# Patient Record
Sex: Female | Born: 1935
Health system: Southern US, Community
[De-identification: ages and names within clinical notes are randomized; demographics above are authoritative.]

## PROBLEM LIST (undated history)

## (undated) DIAGNOSIS — N189 Chronic kidney disease, unspecified: Secondary | ICD-10-CM

## (undated) DIAGNOSIS — C801 Malignant (primary) neoplasm, unspecified: Secondary | ICD-10-CM

## (undated) DIAGNOSIS — M329 Systemic lupus erythematosus, unspecified: Secondary | ICD-10-CM

## (undated) DIAGNOSIS — I219 Acute myocardial infarction, unspecified: Secondary | ICD-10-CM

## (undated) DIAGNOSIS — I5181 Takotsubo syndrome: Secondary | ICD-10-CM

## (undated) DIAGNOSIS — C50919 Malignant neoplasm of unspecified site of unspecified female breast: Secondary | ICD-10-CM

## (undated) DIAGNOSIS — E039 Hypothyroidism, unspecified: Secondary | ICD-10-CM

## (undated) DIAGNOSIS — IMO0002 Reserved for concepts with insufficient information to code with codable children: Secondary | ICD-10-CM

## (undated) DIAGNOSIS — Z1379 Encounter for other screening for genetic and chromosomal anomalies: Secondary | ICD-10-CM

## (undated) DIAGNOSIS — E079 Disorder of thyroid, unspecified: Secondary | ICD-10-CM

## (undated) DIAGNOSIS — M3500 Sicca syndrome, unspecified: Secondary | ICD-10-CM

## (undated) DIAGNOSIS — M1990 Unspecified osteoarthritis, unspecified site: Secondary | ICD-10-CM

## (undated) DIAGNOSIS — Z780 Asymptomatic menopausal state: Secondary | ICD-10-CM

## (undated) DIAGNOSIS — L719 Rosacea, unspecified: Secondary | ICD-10-CM

## (undated) DIAGNOSIS — I252 Old myocardial infarction: Secondary | ICD-10-CM

## (undated) DIAGNOSIS — I10 Essential (primary) hypertension: Secondary | ICD-10-CM

## (undated) HISTORY — DX: Disorder of thyroid, unspecified: E07.9

## (undated) HISTORY — PX: COLONOSCOPY: SHX174

## (undated) HISTORY — PX: TONSILLECTOMY: SUR1361

## (undated) HISTORY — DX: Asymptomatic menopausal state: Z78.0

## (undated) HISTORY — DX: Acute myocardial infarction, unspecified: I21.9

## (undated) HISTORY — DX: Reserved for concepts with insufficient information to code with codable children: IMO0002

## (undated) HISTORY — DX: Essential (primary) hypertension: I10

## (undated) HISTORY — DX: Malignant (primary) neoplasm, unspecified: C80.1

## (undated) HISTORY — DX: Systemic lupus erythematosus, unspecified: M32.9

## (undated) HISTORY — PX: LUMBAR DISC SURGERY: SHX700

## (undated) HISTORY — DX: Malignant neoplasm of unspecified site of unspecified female breast: C50.919

## (undated) HISTORY — DX: Takotsubo syndrome: I51.81

## (undated) HISTORY — DX: Sjogren syndrome, unspecified: M35.00

## (undated) HISTORY — PX: DILATION AND CURETTAGE OF UTERUS: SHX78

## (undated) HISTORY — DX: Rosacea, unspecified: L71.9

## (undated) HISTORY — PX: SPINE SURGERY: SHX786

## (undated) HISTORY — PX: CHOLECYSTECTOMY: SHX55

## (undated) HISTORY — DX: Encounter for other screening for genetic and chromosomal anomalies: Z13.79

## (undated) HISTORY — PX: THYROIDECTOMY: SHX17

## (undated) HISTORY — PX: BACK SURGERY: SHX140

## (undated) HISTORY — PX: TUBAL LIGATION: SHX77

---
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
## 2002-09-07 HISTORY — PX: SHOULDER ARTHROSCOPY: SHX128

## 2004-12-10 ENCOUNTER — Observation Stay: Payer: Self-pay | Admitting: Anesthesiology

## 2005-07-22 ENCOUNTER — Ambulatory Visit: Payer: Self-pay | Admitting: Internal Medicine

## 2005-07-23 ENCOUNTER — Ambulatory Visit: Payer: Self-pay | Admitting: Internal Medicine

## 2005-09-15 ENCOUNTER — Ambulatory Visit: Payer: Self-pay | Admitting: Surgery

## 2006-07-26 ENCOUNTER — Encounter: Payer: Self-pay | Admitting: Internal Medicine

## 2006-10-08 HISTORY — PX: KYPHOSIS SURGERY: SHX114

## 2006-10-15 ENCOUNTER — Ambulatory Visit: Payer: Self-pay

## 2006-10-26 ENCOUNTER — Other Ambulatory Visit: Payer: Self-pay

## 2006-10-28 ENCOUNTER — Ambulatory Visit: Payer: Self-pay | Admitting: Unknown Physician Specialty

## 2006-11-30 ENCOUNTER — Encounter: Payer: Self-pay | Admitting: Unknown Physician Specialty

## 2006-12-07 ENCOUNTER — Encounter: Payer: Self-pay | Admitting: Unknown Physician Specialty

## 2006-12-21 LAB — HM COLONOSCOPY: HM Colonoscopy: NORMAL

## 2007-06-09 ENCOUNTER — Ambulatory Visit: Payer: Self-pay | Admitting: Internal Medicine

## 2007-08-12 ENCOUNTER — Ambulatory Visit: Payer: Self-pay | Admitting: Orthopedic Surgery

## 2007-08-17 ENCOUNTER — Ambulatory Visit: Payer: Self-pay | Admitting: Orthopedic Surgery

## 2008-06-21 ENCOUNTER — Ambulatory Visit: Payer: Self-pay | Admitting: Internal Medicine

## 2009-07-01 ENCOUNTER — Ambulatory Visit: Payer: Self-pay | Admitting: Internal Medicine

## 2010-03-06 LAB — HM PAP SMEAR: HM Pap smear: NEGATIVE

## 2010-08-05 ENCOUNTER — Ambulatory Visit: Payer: Self-pay | Admitting: Internal Medicine

## 2010-12-22 ENCOUNTER — Ambulatory Visit: Payer: Self-pay | Admitting: Internal Medicine

## 2011-05-25 ENCOUNTER — Encounter: Payer: Self-pay | Admitting: Internal Medicine

## 2011-05-25 ENCOUNTER — Ambulatory Visit (INDEPENDENT_AMBULATORY_CARE_PROVIDER_SITE_OTHER): Payer: Medicare Other | Admitting: Internal Medicine

## 2011-05-25 VITALS — BP 134/70 | HR 87 | Temp 97.8°F | Resp 16 | Ht 64.0 in | Wt 149.2 lb

## 2011-05-25 DIAGNOSIS — E039 Hypothyroidism, unspecified: Secondary | ICD-10-CM

## 2011-05-25 DIAGNOSIS — R21 Rash and other nonspecific skin eruption: Secondary | ICD-10-CM

## 2011-05-25 DIAGNOSIS — D692 Other nonthrombocytopenic purpura: Secondary | ICD-10-CM | POA: Insufficient documentation

## 2011-05-25 MED ORDER — LEVOTHYROXINE SODIUM 88 MCG PO TABS: 88.0000 ug | ORAL_TABLET | Freq: Every day | ORAL | Status: DC

## 2011-05-25 MED ORDER — CEPHALEXIN 500 MG PO CAPS: 500.0000 mg | ORAL_CAPSULE | Freq: Three times a day (TID) | ORAL | Status: AC

## 2011-05-25 NOTE — Patient Instructions (Signed)
We will need to check your electrolytes and kidney function in late October of this year. No office visit required,  But make appt for lab.  Make appt for Medicare annual welllness exam in the spring.  Call when you need your mammogram scheduled. We are prescribing an antibiotic to try and clea u p the skin rash./

## 2011-05-25 NOTE — Progress Notes (Signed)
  Subjective:    Patient ID: Kim Santiago, female    DOB: [birth year], 76 y.o.   MRN: [id number]  HPI  76 yr old white female with history of chronic back pain secondary to failed L1 kyphoplasty in 2008,  Prior L4-5 diskectomy, presents for followup on chronic issues.  Has chief complaint of persistent skin rash on chest following a tgrip to the beach 2 months ago with family. Mildly pruritic, nonspreading.  Historyof sune xposrue and public swimming pool exposure.   Past Medical History  Diagnosis Date  . Hypertension   . Cancer     Thyroid  . Menopause 40s    natural, hot flashes and mood lability now gone, off prempro 7 months  . Osteoporosis   . hypothyroidism     secondary to thyroidectomy for thyroid ca   No current outpatient prescriptions on file prior to visit.      Review of Systems  Constitutional: Negative for fever, chills and unexpected weight change.  HENT: Negative for hearing loss, ear pain, nosebleeds, congestion, sore throat, facial swelling, rhinorrhea, sneezing, mouth sores, trouble swallowing, neck pain, neck stiffness, voice change, postnasal drip, sinus pressure, tinnitus and ear discharge.   Eyes: Negative for pain, discharge, redness and visual disturbance.  Respiratory: Negative for cough, chest tightness, shortness of breath, wheezing and stridor.   Cardiovascular: Negative for chest pain, palpitations and leg swelling.  Musculoskeletal: Negative for myalgias and arthralgias.  Skin: Negative for color change and rash.  Neurological: Negative for dizziness, weakness, light-headedness and headaches.  Hematological: Negative for adenopathy.       Objective:   Physical Exam  Constitutional: She is oriented to person, place, and time. She appears well-developed and well-nourished.  HENT:  Mouth/Throat: Oropharynx is clear and moist.  Eyes: EOM are normal. Pupils are equal, round, and reactive to light. No scleral icterus.  Neck: Normal range of motion.  Neck supple. No JVD present. No thyromegaly present.  Cardiovascular: Normal rate, regular rhythm, normal heart sounds and intact distal pulses.   Pulmonary/Chest: Effort normal and breath sounds normal.  Abdominal: Soft. Bowel sounds are normal. She exhibits no mass. There is no tenderness.  Musculoskeletal: Normal range of motion. She exhibits no edema.  Lymphadenopathy:    She has no cervical adenopathy.  Neurological: She is alert and oriented to person, place, and time.  Skin: Skin is warm and dry. Rash noted.     Psychiatric: She has a normal mood and affect.          Assessment & Plan:  Rash:  Has the appearance of impetigo , and given her exposure to public swimming pool will treat empiricially with keflex.   Osteoporosisl with prior L1 vertebral fracture.  Will request old records from [facility] and check with insurance about Prolia coverage. Discussed calcium, vitamin d supplementation and recommended increasing calcium to 1200 mg daily .

## 2011-06-18 ENCOUNTER — Encounter: Payer: Self-pay | Admitting: Internal Medicine

## 2011-06-29 ENCOUNTER — Other Ambulatory Visit: Payer: Medicare Other

## 2011-07-03 ENCOUNTER — Other Ambulatory Visit: Payer: Medicare Other

## 2011-07-06 ENCOUNTER — Other Ambulatory Visit (INDEPENDENT_AMBULATORY_CARE_PROVIDER_SITE_OTHER): Payer: Medicare Other | Admitting: *Deleted

## 2011-07-06 DIAGNOSIS — Z79899 Other long term (current) drug therapy: Secondary | ICD-10-CM

## 2011-07-06 LAB — RENAL FUNCTION PANEL
Albumin: 4.1 g/dL (ref 3.5–5.2)
BUN: 15 mg/dL (ref 6–23)
CO2: 31 mEq/L (ref 19–32)
Chloride: 98 mEq/L (ref 96–112)
GFR: 84.04 mL/min (ref >60.00)
Potassium: 4 mEq/L (ref 3.5–5.1)

## 2011-09-08 DIAGNOSIS — I219 Acute myocardial infarction, unspecified: Secondary | ICD-10-CM

## 2011-09-08 HISTORY — DX: Acute myocardial infarction, unspecified: I21.9

## 2011-09-30 ENCOUNTER — Ambulatory Visit: Payer: Self-pay | Admitting: Internal Medicine

## 2011-10-02 ENCOUNTER — Encounter: Payer: Self-pay | Admitting: Internal Medicine

## 2011-10-02 ENCOUNTER — Ambulatory Visit (INDEPENDENT_AMBULATORY_CARE_PROVIDER_SITE_OTHER): Payer: Medicare Other | Admitting: Internal Medicine

## 2011-10-02 VITALS — BP 128/70 | HR 80 | Temp 97.7°F | Wt 150.0 lb

## 2011-10-02 DIAGNOSIS — J310 Chronic rhinitis: Secondary | ICD-10-CM

## 2011-10-02 DIAGNOSIS — E039 Hypothyroidism, unspecified: Secondary | ICD-10-CM

## 2011-10-02 DIAGNOSIS — E032 Hypothyroidism due to medicaments and other exogenous substances: Secondary | ICD-10-CM | POA: Insufficient documentation

## 2011-10-02 DIAGNOSIS — H6692 Otitis media, unspecified, left ear: Secondary | ICD-10-CM

## 2011-10-02 DIAGNOSIS — H6690 Otitis media, unspecified, unspecified ear: Secondary | ICD-10-CM

## 2011-10-02 DIAGNOSIS — I10 Essential (primary) hypertension: Secondary | ICD-10-CM | POA: Insufficient documentation

## 2011-10-02 LAB — HM MAMMOGRAPHY: HM Mammogram: NORMAL

## 2011-10-02 MED ORDER — OMEPRAZOLE 20 MG PO CPDR: 20.0000 mg | DELAYED_RELEASE_CAPSULE | Freq: Every day | ORAL | Status: DC

## 2011-10-02 MED ORDER — AMOXICILLIN-POT CLAVULANATE 875-125 MG PO TABS: 1.0000 | ORAL_TABLET | Freq: Two times a day (BID) | ORAL | Status: AC

## 2011-10-02 MED ORDER — HYDROCHLOROTHIAZIDE 25 MG PO TABS: 25.0000 mg | ORAL_TABLET | Freq: Every day | ORAL | Status: DC

## 2011-10-02 MED ORDER — LEVOTHYROXINE SODIUM 88 MCG PO TABS: 88.0000 ug | ORAL_TABLET | Freq: Every day | ORAL | Status: DC

## 2011-10-02 MED ORDER — MOMETASONE FUROATE 50 MCG/ACT NA SUSP: 2.0000 | Freq: Every day | NASAL | Status: DC

## 2011-10-02 NOTE — Assessment & Plan Note (Signed)
Secondary to prolonged sinus congestion with inflammation of the left-sided maxillary sinus as well. Will treat with Augmentin 875/125 one tablet twice daily for 10 days I have asked her to start using Sudafed PE 10 mg every 6 hours as needed for congestion for the next few days. She will start Nasonex one squirt on each side daily starting on Sunday to manage inflammation and allergic rhinitis. I've also recommended she start using simply saline as a saline lavage twice daily to manage any debris and to flush sinuses.

## 2011-10-02 NOTE — Progress Notes (Signed)
  Subjective:    Patient ID: Kim Santiago, female    DOB: 11-30-1934, 77 y.o.   MRN: [id number]  HPI  77 yr old presents with recurrent sinusitis,  Now with left cheek pain and eye pain for the past week.  Using saline and afrin x 7 days  Which has been helping.     Review of Systems  Constitutional: Negative for fever, chills and unexpected weight change.  HENT: Positive for ear pain, congestion, rhinorrhea, sneezing, postnasal drip and sinus pressure. Negative for hearing loss, nosebleeds, sore throat, facial swelling, mouth sores, trouble swallowing, neck pain, neck stiffness, voice change, tinnitus and ear discharge.   Eyes: Negative for pain, discharge, redness and visual disturbance.  Respiratory: Negative for cough, chest tightness, shortness of breath, wheezing and stridor.   Cardiovascular: Negative for chest pain, palpitations and leg swelling.  Musculoskeletal: Negative for myalgias and arthralgias.  Skin: Negative for color change and rash.  Neurological: Negative for dizziness, weakness, light-headedness and headaches.  Hematological: Negative for adenopathy.       Objective:   Physical Exam  Constitutional: She is oriented to person, place, and time. She appears well-developed and well-nourished.  HENT:  Right Ear: Tympanic membrane and ear canal normal.  Left Ear: Hearing and ear canal normal. Tympanic membrane is injected and scarred. A middle ear effusion is present.  Nose:    Mouth/Throat: Oropharynx is clear and moist.  Eyes: EOM are normal. Pupils are equal, round, and reactive to light. No scleral icterus.  Neck: Normal range of motion. Neck supple. No JVD present. No thyromegaly present.  Cardiovascular: Normal rate, regular rhythm, normal heart sounds and intact distal pulses.   Pulmonary/Chest: Effort normal and breath sounds normal.  Abdominal: Soft. Bowel sounds are normal. She exhibits no mass. There is no tenderness.  Musculoskeletal: Normal range of  motion. She exhibits no edema.  Lymphadenopathy:    She has no cervical adenopathy.  Neurological: She is alert and oriented to person, place, and time.  Skin: Skin is warm and dry.  Psychiatric: She has a normal mood and affect.          Assessment & Plan:  Sinusitis :  Recurrent

## 2011-10-02 NOTE — Assessment & Plan Note (Signed)
Managed with low-dose generic Synthroid with therapeutic TSH by last check in October.

## 2011-10-02 NOTE — Patient Instructions (Signed)
Augmentin for 10 days, for ear and sinus infection  Add Sufafed PE 10 mg every 6 hours for ear pian /congestion for a few days    Start the steroid nasal spray (nasonex ) on Sunday   (this deals with inflammation and allergic component)   Use simply Saline (OTC)  Twice daily before your steroid dose to flush sinuses

## 2011-10-02 NOTE — Assessment & Plan Note (Signed)
Well-controlled on hydrochlorothiazide 25 mg 1 tablet daily .  renal function is normal per last checked in October.

## 2011-10-06 ENCOUNTER — Telehealth: Payer: Self-pay | Admitting: *Deleted

## 2011-10-06 NOTE — Telephone Encounter (Signed)
Advised pt.  She took for 3 and a half days.  She will stop and will call if any problems.

## 2011-10-06 NOTE — Telephone Encounter (Signed)
Pt has been taking amox for a sinus infection since last Friday.  She noticed she has a fine non itchy rash on her legs since last night.  She hasnt had any amox today, last took a dose last night.  She thinks she is having a reaction to the amox.  She is also taking nasonex.  Please advise.

## 2011-10-06 NOTE — Telephone Encounter (Signed)
Left message on machine for patient to call.

## 2011-10-06 NOTE — Telephone Encounter (Signed)
Advised patient

## 2011-10-06 NOTE — Telephone Encounter (Signed)
If any ear pain, persistent sinus pressure, fever, then we should start another antibiotic.  I would recommend Azithromycin 250mg  tabs, 2 tabs on day 1 then 1 tab daily x 4 days.

## 2011-10-06 NOTE — Telephone Encounter (Signed)
Lets have her stop Amoxicillin.  How many days did she complete? If ANY wheezing, shortness of breath or other symptoms, she should call us.

## 2011-10-12 ENCOUNTER — Telehealth: Payer: Self-pay | Admitting: Internal Medicine

## 2011-10-12 NOTE — Telephone Encounter (Signed)
See note below.  Pt says she is much better but still feels something there. She doesn't have any fever or colored drainage.  She has a little drainage, but not more than usual. She is using nasonex and has found a small painless white spot on her jaw.

## 2011-10-12 NOTE — Telephone Encounter (Signed)
Pt called when she came in  In Jan 25 you gave her rx for sinus infection.  She broke out is a rash and dr walker told her to stop taking meds.  She took them for 4 days.  Pt stated she still feels pressure she is a little better she wanted to know if she should take another antiobodic  Or should she wait to see how she doing since she took the meds for 4 days. Pt still is on nasanex the pressure is not bad  [facility] [place]

## 2011-10-12 NOTE — Telephone Encounter (Signed)
She does not need an antibiotic if her drainage is now clear. Have her add afrin twice daily to the nasonex for a few days if she still feels congested.  I do not know what to say about the white spot without seeing it

## 2011-10-13 NOTE — Telephone Encounter (Signed)
Advised pt, she will keep an eye on the white spot and will call back if needed.

## 2011-10-16 ENCOUNTER — Telehealth: Payer: Self-pay | Admitting: *Deleted

## 2011-10-16 NOTE — Telephone Encounter (Signed)
She has been taking hctz for over a year without a problem,  They should know that

## 2011-10-16 NOTE — Telephone Encounter (Signed)
[facility] has faxed a form asking for clarification on HCTZ script.  Fax is in red folder.  They are asking if ok to fill since pt has a sulfa allergy.

## 2011-10-19 NOTE — Telephone Encounter (Signed)
Form faxed back to [facility].

## 2011-11-06 ENCOUNTER — Ambulatory Visit (INDEPENDENT_AMBULATORY_CARE_PROVIDER_SITE_OTHER): Payer: Medicare Other | Admitting: Internal Medicine

## 2011-11-06 VITALS — BP 120/86 | HR 74 | Temp 98.6°F

## 2011-11-06 DIAGNOSIS — N390 Urinary tract infection, site not specified: Secondary | ICD-10-CM

## 2011-11-06 LAB — POCT URINALYSIS DIPSTICK
Bilirubin, UA: NEGATIVE
Glucose, UA: NEGATIVE
Nitrite, UA: NEGATIVE
Spec Grav, UA: 1.015

## 2011-11-06 MED ORDER — CIPROFLOXACIN HCL 500 MG PO TABS: 500.0000 mg | ORAL_TABLET | Freq: Two times a day (BID) | ORAL | Status: AC

## 2011-11-06 NOTE — Progress Notes (Signed)
  Subjective:    Patient ID: Kim Santiago, female    DOB: 09/06/1935, 77 y.o.   MRN: [id number]  HPI   77 yr old white female presents with left sided back pain and new onset frequency and urgency for the last 4 days.  Some mild nausea but no vomiting     Review of Systems  Constitutional: Negative for fever, chills and unexpected weight change.  HENT: Negative for hearing loss, ear pain, nosebleeds, congestion, sore throat, facial swelling, rhinorrhea, sneezing, mouth sores, trouble swallowing, neck pain, neck stiffness, voice change, postnasal drip, sinus pressure, tinnitus and ear discharge.   Eyes: Negative for pain, discharge, redness and visual disturbance.  Respiratory: Negative for cough, chest tightness, shortness of breath, wheezing and stridor.   Cardiovascular: Negative for chest pain, palpitations and leg swelling.  Musculoskeletal: Negative for myalgias and arthralgias.  Skin: Negative for color change and rash.  Neurological: Negative for dizziness, weakness, light-headedness and headaches.  Hematological: Negative for adenopathy.       Objective:   Physical Exam        Assessment & Plan:   Urinary tract infection With positive UA.  Will treat empirically pending urine culture.    Updated Medication List Outpatient Encounter Prescriptions as of 11/06/2011  Medication Sig Dispense Refill  . Calcium Carbonate-Vitamin D (CALCIUM + D) 600-200 MG-UNIT TABS Take 1 tablet by mouth daily.        . cholecalciferol (VITAMIN D) 1000 UNITS tablet Take 1,000 Units by mouth daily.        . ciprofloxacin (CIPRO) 500 MG tablet Take 1 tablet (500 mg total) by mouth 2 (two) times daily.  10 tablet  0  . hydrochlorothiazide (HYDRODIURIL) 25 MG tablet Take 1 tablet (25 mg total) by mouth daily.  90 tablet  3  . levothyroxine (SYNTHROID, LEVOTHROID) 88 MCG tablet Take 1 tablet (88 mcg total) by mouth daily.  90 tablet  3  . mometasone (NASONEX) 50 MCG/ACT nasal spray Place 2  sprays into the nose daily.  17 g  12  . Multiple Vitamins-Minerals (OSTEO COMPLEX PO) Take one by mouth daily      . omeprazole (PRILOSEC) 20 MG capsule Take 1 capsule (20 mg total) by mouth daily.  90 capsule  3

## 2011-11-08 ENCOUNTER — Encounter: Payer: Self-pay | Admitting: Internal Medicine

## 2011-11-08 DIAGNOSIS — N390 Urinary tract infection, site not specified: Secondary | ICD-10-CM | POA: Insufficient documentation

## 2011-11-08 NOTE — Assessment & Plan Note (Signed)
With positive UA.  Will treat empirically pending urine culture.  

## 2011-11-09 LAB — URINE CULTURE: Colony Count: >=100000

## 2011-12-21 ENCOUNTER — Encounter: Payer: Self-pay | Admitting: Internal Medicine

## 2011-12-21 ENCOUNTER — Ambulatory Visit (INDEPENDENT_AMBULATORY_CARE_PROVIDER_SITE_OTHER): Payer: Medicare Other | Admitting: Internal Medicine

## 2011-12-21 VITALS — BP 146/80 | HR 85 | Temp 98.1°F | Resp 16 | Ht 64.5 in | Wt 149.8 lb

## 2011-12-21 DIAGNOSIS — I10 Essential (primary) hypertension: Secondary | ICD-10-CM

## 2011-12-21 DIAGNOSIS — Z0000 Encounter for general adult medical examination without abnormal findings: Secondary | ICD-10-CM

## 2011-12-21 DIAGNOSIS — E039 Hypothyroidism, unspecified: Secondary | ICD-10-CM

## 2011-12-21 NOTE — Assessment & Plan Note (Signed)
Her last DEXA scan was in 2011 at which time her T. scores are in the osteopenia range. We will repeat this in November of 2013 and begin treatment if there has been progression

## 2011-12-21 NOTE — Assessment & Plan Note (Addendum)
Her blood pressure is elevated today but has been well controlled in the past. We have not many changes today. I have asked her to check her pressure at home periodically and call me if the numbers continue to show elevation of the 130/80. She will return for renal function panel at her next trip

## 2011-12-21 NOTE — Patient Instructions (Addendum)
The maximum recommended daily dose of tylenol is 2000 mg  Using alleve or ibuprofen infrequently (less than daily) should not cause an ulcer,  Unless your alcohol intake is > 1 drink /night  Return in May for fasting labs (please drink water before your labs) to check kidney/liver  function,  Thyroid and cholesterol    I recommend yoga classes for strengthening of your thoracic spine   Our April 2012 x rays owed loss of heiight  at T7 and T8, which may have occurred from a mild fracture in the past.   Continue exercise,  Calcium and vitamin d supplements.  We will repeat your DEXA  Scan Nov 2013.

## 2011-12-21 NOTE — Progress Notes (Signed)
Patient ID: Kim Santiago, female   DOB: [birth year], 77 y.o.   MRN: [id number]  The patient is here for annual Medicare wellness examination and management of other chronic and acute problems. She has deferred pelvic exam as this was done last year.   The risk factors are reflected in the social history.  The roster of all physicians providing medical care to patient - is listed in the Snapshot section of the chart.  Activities of daily living:  The patient is 100% independent in all ADLs: dressing, toileting, feeding as well as independent mobility  Home safety : The patient has smoke detectors in the home. They wear seatbelts.  There are no firearms at home. There is no violence in the home.   There is no risks for hepatitis, STDs or HIV. There is no   history of blood transfusion. They have no travel history to infectious disease endemic areas of the world.  The patient has seen their dentist in the last six month. They have seen their eye doctor in the last year. They admit to slight hearing difficulty with regard to whispered voices and some television programs.  They have deferred audiologic testing in the last year.  They do not  have excessive sun exposure. Discussed the need for sun protection: hats, long sleeves and use of sunscreen if there is significant sun exposure.   Diet: the importance of a healthy diet is discussed. They do have a healthy diet.  The benefits of regular aerobic exercise were discussed. She walks 4 times per week ,  20 minutes.   Depression screen: there are no signs or vegative symptoms of depression- irritability, change in appetite, anhedonia, sadness/tearfullness.  Cognitive assessment: the patient manages all their financial and personal affairs and is actively engaged. They could relate day,date,year and events; recalled 2/3 objects at 3 minutes; performed clock-face test normally.  The following portions of the patient's history were reviewed and  updated as appropriate: allergies, current medications, past family history, past medical history,  past surgical history, past social history  and problem list.  Visual acuity was not assessed per patient preference since she has regular follow up with her ophthalmologist. Hearing and body mass index were assessed and reviewed.   During the course of the visit the patient was educated and counseled about appropriate screening and preventive services including : fall prevention , diabetes screening, nutrition counseling, colorectal cancer screening, and recommended immunizations.   BP 146/80  Pulse 85  Temp(Src) 98.1 F (36.7 C) (Oral)  Resp 16  Ht 5' 4.5" (1.638 m)  Wt 149 lb 12 oz (67.926 kg)  BMI 25.31 kg/m2  SpO2 100%  BP 146/80  Pulse 85  Temp(Src) 98.1 F (36.7 C) (Oral)  Resp 16  Ht 5' 4.5" (1.638 m)  Wt 149 lb 12 oz (67.926 kg)  BMI 25.31 kg/m2  SpO2 100%  General Appearance:    Alert, cooperative, no distress, appears stated age  Head:    Normocephalic, without obvious abnormality, atraumatic  Eyes:    PERRL, conjunctiva/corneas clear, EOM's intact, fundi    benign, both eyes  Ears:    Normal TM's and external ear canals, both ears  Nose:   Nares normal, septum midline, mucosa normal, no drainage    or sinus tenderness  Throat:   Lips, mucosa, and tongue normal; teeth and gums normal  Neck:   Supple, symmetrical, trachea midline, no adenopathy;    thyroid:  no enlargement/tenderness/nodules; no carotid  bruit or JVD  Back:     Symmetric, no curvature, ROM normal, no CVA tenderness  Lungs:     Clear to auscultation bilaterally, respirations unlabored  Chest Wall:    No tenderness or deformity   Heart:    Regular rate and rhythm, S1 and S2 normal, no murmur, rub   or gallop     Abdomen:     Soft, non-tender, bowel sounds active all four quadrants,    no masses, no organomegaly        Extremities:   Extremities normal, atraumatic, no cyanosis or edema  Pulses:    2+ and symmetric all extremities  Skin:   Skin color, texture, turgor normal, no rashes or lesions  Lymph nodes:   Cervical, supraclavicular, and axillary nodes normal  Neurologic:   CNII-XII intact, normal strength, sensation and reflexes    throughout    Hypertension Her blood pressure is elevated today but has been well controlled in the past. We have not many changes today. I have asked her to check her pressure at home periodically and call me if the numbers continue to show elevation of the 130/80. She will return for renal function panel at her next trip  Screening for osteoporosis Her last DEXA scan was in 2011 at which time her T. scores are in the osteopenia range. We will repeat this in November of 2013 and begin treatment if there has been progression  Unspecified hypothyroidism She is due for a repeat TSH when she returns for her impending trip. We will also check liver lipid panel at the same time.    Updated Medication List Outpatient Encounter Prescriptions as of 12/21/2011  Medication Sig Dispense Refill  . Calcium Carbonate-Vitamin D (CALCIUM + D) 600-200 MG-UNIT TABS Take 1 tablet by mouth daily.        . cholecalciferol (VITAMIN D) 1000 UNITS tablet Take 1,000 Units by mouth daily.        . hydrochlorothiazide (HYDRODIURIL) 25 MG tablet Take 1 tablet (25 mg total) by mouth daily.  90 tablet  3  . levothyroxine (SYNTHROID, LEVOTHROID) 88 MCG tablet Take 1 tablet (88 mcg total) by mouth daily.  90 tablet  3  . mometasone (NASONEX) 50 MCG/ACT nasal spray Place 2 sprays into the nose daily.  17 g  12  . Multiple Vitamins-Minerals (OSTEO COMPLEX PO) Take one by mouth daily      . omeprazole (PRILOSEC) 20 MG capsule Take 1 capsule (20 mg total) by mouth daily.  90 capsule  3  . vitamin B-12 (CYANOCOBALAMIN) 1000 MCG tablet Take 1,000 mcg by mouth daily.

## 2011-12-21 NOTE — Assessment & Plan Note (Signed)
She is due for a repeat TSH when she returns for her impending trip. We will also check liver lipid panel at the same time.

## 2012-01-06 ENCOUNTER — Telehealth: Payer: Self-pay | Admitting: Internal Medicine

## 2012-01-06 MED ORDER — ZOSTER VACCINE LIVE 19400 UNT/0.65ML ~~LOC~~ SOLR: 0.6500 mL | Freq: Once | SUBCUTANEOUS | Status: DC

## 2012-01-06 NOTE — Telephone Encounter (Signed)
[facility]  [facility] so she doesn't have to wait

## 2012-01-06 NOTE — Telephone Encounter (Signed)
[phone] Pt would like to get a rx for shingle shot [employer] [facility]  [address] Pt can pick up when have labs on 5/14.

## 2012-01-07 MED ORDER — ZOSTER VACCINE LIVE 19400 UNT/0.65ML ~~LOC~~ SOLR: 0.6500 mL | Freq: Once | SUBCUTANEOUS | Status: AC

## 2012-01-07 NOTE — Telephone Encounter (Signed)
Patient notified, Rx sent to pharmacy

## 2012-01-11 ENCOUNTER — Telehealth: Payer: Self-pay | Admitting: Internal Medicine

## 2012-01-11 NOTE — Telephone Encounter (Signed)
[phone] Pt called checking on shingle rx .  Her drug store said they didn't have it [address]  [facility]

## 2012-01-11 NOTE — Telephone Encounter (Signed)
This has already been done. Patient notified.

## 2012-01-19 ENCOUNTER — Other Ambulatory Visit (INDEPENDENT_AMBULATORY_CARE_PROVIDER_SITE_OTHER): Payer: Medicare Other | Admitting: *Deleted

## 2012-01-19 DIAGNOSIS — Z79899 Other long term (current) drug therapy: Secondary | ICD-10-CM

## 2012-01-19 DIAGNOSIS — I10 Essential (primary) hypertension: Secondary | ICD-10-CM

## 2012-01-19 DIAGNOSIS — E0590 Thyrotoxicosis, unspecified without thyrotoxic crisis or storm: Secondary | ICD-10-CM

## 2012-01-19 LAB — COMPREHENSIVE METABOLIC PANEL
BUN: 16 mg/dL (ref 6–23)
CO2: 30 mEq/L (ref 19–32)
Calcium: 9.3 mg/dL (ref 8.4–10.5)
Chloride: 102 mEq/L (ref 96–112)
Creatinine, Ser: 0.7 mg/dL (ref 0.4–1.2)
GFR: 89.64 mL/min (ref >60.00)
Glucose, Bld: 82 mg/dL (ref 70–99)
Total Bilirubin: 0.5 mg/dL (ref 0.3–1.2)

## 2012-01-19 LAB — LIPID PANEL
Cholesterol: 168 mg/dL (ref 0–200)
HDL: 43.1 mg/dL (ref >39.00)
Triglycerides: 83 mg/dL (ref 0.0–149.0)

## 2012-01-19 LAB — HEPATIC FUNCTION PANEL
AST: 18 U/L (ref 0–37)
Albumin: 3.7 g/dL (ref 3.5–5.2)
Alkaline Phosphatase: 80 U/L (ref 39–117)
Bilirubin, Direct: 0 mg/dL (ref 0.0–0.3)
Total Bilirubin: 0.5 mg/dL (ref 0.3–1.2)

## 2012-02-02 ENCOUNTER — Encounter: Payer: Self-pay | Admitting: Internal Medicine

## 2012-05-08 HISTORY — PX: CARDIAC CATHETERIZATION: SHX172

## 2012-05-30 ENCOUNTER — Inpatient Hospital Stay: Payer: Self-pay | Admitting: Internal Medicine

## 2012-05-30 ENCOUNTER — Telehealth: Payer: Self-pay | Admitting: Internal Medicine

## 2012-05-30 DIAGNOSIS — R079 Chest pain, unspecified: Secondary | ICD-10-CM

## 2012-05-30 LAB — CBC
HCT: 43.4 % (ref 35.0–47.0)
MCH: 30.8 pg (ref 26.0–34.0)
MCHC: 34.9 g/dL (ref 32.0–36.0)
Platelet: 215 10*3/uL (ref 150–440)
RDW: 13.6 % (ref 11.5–14.5)
WBC: 5.8 10*3/uL (ref 3.6–11.0)

## 2012-05-30 LAB — BASIC METABOLIC PANEL
Anion Gap: 8 (ref 7–16)
BUN: 11 mg/dL (ref 7–18)
Calcium, Total: 9.4 mg/dL (ref 8.5–10.1)
Chloride: 100 mmol/L (ref 98–107)
Co2: 29 mmol/L (ref 21–32)
Creatinine: 0.53 mg/dL — ABNORMAL LOW (ref 0.60–1.30)
EGFR (African American): >60
Glucose: 133 mg/dL — ABNORMAL HIGH (ref 65–99)
Osmolality: 275 (ref 275–301)

## 2012-05-30 LAB — TROPONIN I: Troponin-I: 0.07 ng/mL — ABNORMAL HIGH

## 2012-05-30 LAB — CK TOTAL AND CKMB (NOT AT ARMC): CK, Total: 53 U/L (ref 21–215)

## 2012-05-30 NOTE — Telephone Encounter (Signed)
Patient calling, having severe chest pain.  A friend is driving her to the ED now and states that they are almost there.  She did take an adult ASA.  Has not had heart issues in the past and no NTG to use.

## 2012-05-31 LAB — CBC WITH DIFFERENTIAL/PLATELET
Basophil #: 0.1 10*3/uL (ref 0.0–0.1)
Eosinophil #: 0.3 10*3/uL (ref 0.0–0.7)
Eosinophil %: 3.9 %
Lymphocyte %: 31.6 %
MCH: 29.9 pg (ref 26.0–34.0)
MCHC: 34.4 g/dL (ref 32.0–36.0)
MCV: 87 fL (ref 80–100)
Monocyte #: 0.7 x10 3/mm (ref 0.2–0.9)
Neutrophil %: 55.8 %
Platelet: 229 10*3/uL (ref 150–440)
RBC: 4.44 10*6/uL (ref 3.80–5.20)
WBC: 8 10*3/uL (ref 3.6–11.0)

## 2012-05-31 LAB — BASIC METABOLIC PANEL
Calcium, Total: 9 mg/dL (ref 8.5–10.1)
Creatinine: 0.63 mg/dL (ref 0.60–1.30)
EGFR (African American): >60
EGFR (Non-African Amer.): >60
Glucose: 107 mg/dL — ABNORMAL HIGH (ref 65–99)
Osmolality: 277 (ref 275–301)
Sodium: 139 mmol/L (ref 136–145)

## 2012-05-31 LAB — TROPONIN I: Troponin-I: 0.83 ng/mL — ABNORMAL HIGH

## 2012-05-31 LAB — APTT: Activated PTT: 119.2 secs — ABNORMAL HIGH (ref 23.6–35.9)

## 2012-06-01 ENCOUNTER — Telehealth: Payer: Self-pay | Admitting: Internal Medicine

## 2012-06-01 LAB — LIPID PANEL
Cholesterol: 157 mg/dL (ref 0–200)
Ldl Cholesterol, Calc: 108 mg/dL — ABNORMAL HIGH (ref 0–100)
Triglycerides: 57 mg/dL (ref 0–200)
VLDL Cholesterol, Calc: 11 mg/dL (ref 5–40)

## 2012-06-01 NOTE — Telephone Encounter (Signed)
Hospital follow up discharged 9/25 appointment 10/7

## 2012-06-02 NOTE — Telephone Encounter (Signed)
I spoke with patient, she stated she is feeling better since being discharged and was asked to bring her discharge summary and all of her current medications with her to the appt, and to call the office if she has any issues before then.

## 2012-06-06 ENCOUNTER — Encounter: Payer: Self-pay | Admitting: *Deleted

## 2012-06-09 ENCOUNTER — Encounter: Payer: Medicare Other | Admitting: Cardiovascular Disease

## 2012-06-13 ENCOUNTER — Ambulatory Visit: Payer: Medicare Other | Admitting: Internal Medicine

## 2012-06-15 ENCOUNTER — Encounter: Payer: Self-pay | Admitting: *Deleted

## 2012-06-21 ENCOUNTER — Other Ambulatory Visit: Payer: Self-pay

## 2012-06-21 ENCOUNTER — Ambulatory Visit (INDEPENDENT_AMBULATORY_CARE_PROVIDER_SITE_OTHER): Payer: Medicare Other | Admitting: Cardiovascular Disease

## 2012-06-21 ENCOUNTER — Encounter: Payer: Self-pay | Admitting: Cardiovascular Disease

## 2012-06-21 VITALS — BP 110/68 | HR 100 | Ht 64.0 in | Wt 145.5 lb

## 2012-06-21 DIAGNOSIS — I214 Non-ST elevation (NSTEMI) myocardial infarction: Secondary | ICD-10-CM

## 2012-06-21 DIAGNOSIS — I5181 Takotsubo syndrome: Secondary | ICD-10-CM

## 2012-06-21 DIAGNOSIS — I10 Essential (primary) hypertension: Secondary | ICD-10-CM

## 2012-06-21 MED ORDER — METOPROLOL SUCCINATE ER 25 MG PO TB24: 25.0000 mg | ORAL_TABLET | Freq: Every day | ORAL | Status: DC

## 2012-06-21 MED ORDER — LOSARTAN POTASSIUM 25 MG PO TABS: 12.5000 mg | ORAL_TABLET | Freq: Every day | ORAL | Status: DC

## 2012-06-21 NOTE — Telephone Encounter (Signed)
Patient would like medication sent to [facility] in [place].

## 2012-06-21 NOTE — Patient Instructions (Addendum)
Stop taking Lisinopril.  Start Metoprolol ER 25 mg once daily.  Start Losartan 12.5 mg once daily (1/2 tablet).  Follow up in 1 month.

## 2012-07-01 ENCOUNTER — Encounter: Payer: Self-pay | Admitting: Cardiovascular Disease

## 2012-07-01 DIAGNOSIS — I5181 Takotsubo syndrome: Secondary | ICD-10-CM | POA: Insufficient documentation

## 2012-07-01 NOTE — Assessment & Plan Note (Signed)
Continue to hold hydrochlorothiazide in order to allow up titration of a beta blocker and an ARB.

## 2012-07-01 NOTE — Progress Notes (Signed)
HPI  This is a 77 year old pleasant female who is here today for a followup visit. She presented last month at [facility] with typical chest discomfort suggestive of myocardial infarction. Her troponin peaked at 1.8 with a dynamic ST changes. She underwent cardiac catheterization which showed no significant coronary artery disease with evidence of stress-induced cardiomyopathy and ejection fraction of 35%. A true stressful event could not be determined from the patient. However, her left ventricular angiography was classic. She was discharged home on a small dose lisinopril. Overall, she has been doing reasonably well but complains of dry cough since hospital discharge. She denies any further chest pain. It appears that she was not discharged on a beta blocker due to relatively low blood pressure.  Allergies  Allergen Reactions  . Amoxicillin Rash  . Codeine   . Naprosyn (Naproxen) Swelling  . Orudis (Ketoprofen)   . Sulfathiazole Rash     Current Outpatient Prescriptions on File Prior to Visit  Medication Sig Dispense Refill  . Calcium Carbonate-Vitamin D (CALCIUM + D) 600-200 MG-UNIT TABS Take 1 tablet by mouth daily.        . cholecalciferol (VITAMIN D) 1000 UNITS tablet Take 1,000 Units by mouth daily.        . famotidine (PEPCID) 20 MG tablet Take 20 mg by mouth 2 (two) times daily as needed.      . hydrochlorothiazide (HYDRODIURIL) 25 MG tablet Take 1 tablet (25 mg total) by mouth daily.  90 tablet  3  . levothyroxine (SYNTHROID, LEVOTHROID) 100 MCG tablet Take 100 mcg by mouth daily.      . mometasone (NASONEX) 50 MCG/ACT nasal spray Place 2 sprays into the nose daily.  17 g  12  . Multiple Vitamins-Minerals (OSTEO COMPLEX PO) Take one by mouth daily      . omeprazole (PRILOSEC) 20 MG capsule Take 1 capsule (20 mg total) by mouth daily.  90 capsule  3  . vitamin B-12 (CYANOCOBALAMIN) 1000 MCG tablet Take 1,000 mcg by mouth daily.      Marland Kitchen losartan (COZAAR) 25 MG tablet Take 0.5 tablets  (12.5 mg total) by mouth daily.  30 tablet  3  . metoprolol succinate (TOPROL XL) 25 MG 24 hr tablet Take 1 tablet (25 mg total) by mouth daily.  30 tablet  3     Past Medical History  Diagnosis Date  . Cancer     Thyroid  . Menopause 40s    natural, hot flashes and mood lability now gone, off prempro 7 months  . Osteoporosis   . hypothyroidism     secondary to thyroidectomy for thyroid ca  . Hypertension   . Myocardial infarction 2013  . Stress-induced cardiomyopathy September of 2013    EF 35%. Peak troponin was 1.8.     Past Surgical History  Procedure Date  . Kyphosis surgery Feb 2008    L1, Dr. Gerrit Heck  . Cholecystectomy   . Tonsillectomy   . Tubal ligation   . Lumbar disc surgery     L4-L5  . Shoulder arthroscopy 2004    Left, Dr. Gavin Potters  . Thyroidectomy     Thyroid Cancer  . Spine surgery     L4-5 diskectomy  . Cardiac catheterization 05/2012    [facility]. No significant CAD. Ejection fraction of 35% due to stress-induced cardiomyopathy.     Family History  Problem Relation Age of Onset  . COPD Father   . Cancer Father     esophageal  .  Kidney disease Mother   . Heart disease Mother   . Kidney disease Sister   . Cancer Brother 45    colon cancer (both brothers)     History   Social History  . Marital Status: Married    Spouse Name: N/A    Number of Children: N/A  . Years of Education: N/A   Occupational History  . Not on file.   Social History Main Topics  . Smoking status: Never Smoker   . Smokeless tobacco: Never Used  . Alcohol Use: No  . Drug Use: No  . Sexually Active: Not on file   Other Topics Concern  . Not on file   Social History Narrative  . No narrative on file       PHYSICAL EXAM   BP 110/68  Pulse 100  Ht 5\' 4"  (1.626 m)  Wt 145 lb 8 oz (65.998 kg)  BMI 24.97 kg/m2  Constitutional: She is oriented to person, place, and time. She appears well-developed and well-nourished. No distress.  HENT: No nasal discharge.   Head: Normocephalic and atraumatic.  Eyes: Pupils are equal and round. Right eye exhibits no discharge. Left eye exhibits no discharge.  Neck: Normal range of motion. Neck supple. No JVD present. No thyromegaly present.  Cardiovascular: Normal rate, regular rhythm, normal heart sounds. Exam reveals no gallop and no friction rub. No murmur heard.  Pulmonary/Chest: Effort normal and breath sounds normal. No stridor. No respiratory distress. She has no wheezes. She has no rales. She exhibits no tenderness.  Abdominal: Soft. Bowel sounds are normal. She exhibits no distension. There is no tenderness. There is no rebound and no guarding.  Musculoskeletal: Normal range of motion. She exhibits no edema and no tenderness.  Neurological: She is alert and oriented to person, place, and time. Coordination normal.  Skin: Skin is warm and dry. No rash noted. She is not diaphoretic. No erythema. No pallor.  Psychiatric: She has a normal mood and affect. Her behavior is normal. Judgment and thought content normal.  No groin hematoma.   ASSESSMENT AND PLAN

## 2012-07-01 NOTE — Assessment & Plan Note (Addendum)
Ejection fraction was 35%. She seems to be doing reasonably well since hospital discharge. However, she is having dry cough which is likely due to lisinopril. Thus, I will switch her to losartan small dose 12.5 mg once daily. I will also start her on Toprol 25 mg once daily. I will have her followup in a month to see if these medications can be uptitrated. I will plan on repeat echocardiogram in few months to reevaluate her ejection fraction.

## 2012-07-11 ENCOUNTER — Telehealth: Payer: Self-pay | Admitting: Internal Medicine

## 2012-07-11 NOTE — Telephone Encounter (Signed)
Caller: Dawnita/Patient; Patient Name: Kim Santiago; PCP: Duncan Dull (Adults only); Best Callback Phone Number: [phone] Calling about URI sxs - cough productive of thick green; hoarseness; scratchy throat;tenderness over cheeks; mild h/a. Onset: 07/04/12. Afebrile. Taking Mucinex PE and Acetaminophen w/ minimal improvement. All emergent sxs per Upper Respiratory Infection rulled out with exception to 'Mild to moderate headache for more than 24 hrs unrelieved with nonprescription medications'.  Appt scheduled with Dr. Darrick Huntsman 07/12/12 at 0930.

## 2012-07-12 ENCOUNTER — Ambulatory Visit (INDEPENDENT_AMBULATORY_CARE_PROVIDER_SITE_OTHER): Payer: Medicare Other | Admitting: Internal Medicine

## 2012-07-12 ENCOUNTER — Encounter: Payer: Self-pay | Admitting: Internal Medicine

## 2012-07-12 ENCOUNTER — Other Ambulatory Visit: Payer: Self-pay

## 2012-07-12 VITALS — BP 132/68 | HR 71 | Temp 98.1°F | Ht 64.5 in | Wt 146.0 lb

## 2012-07-12 DIAGNOSIS — H6692 Otitis media, unspecified, left ear: Secondary | ICD-10-CM

## 2012-07-12 DIAGNOSIS — I5181 Takotsubo syndrome: Secondary | ICD-10-CM

## 2012-07-12 DIAGNOSIS — Z13820 Encounter for screening for osteoporosis: Secondary | ICD-10-CM

## 2012-07-12 DIAGNOSIS — H6690 Otitis media, unspecified, unspecified ear: Secondary | ICD-10-CM

## 2012-07-12 MED ORDER — LEVOFLOXACIN 500 MG PO TABS: 500.0000 mg | ORAL_TABLET | Freq: Every day | ORAL | Status: DC

## 2012-07-12 MED ORDER — HYDROCODONE-ACETAMINOPHEN 10-325 MG PO TABS: 1.0000 | ORAL_TABLET | Freq: Four times a day (QID) | ORAL | Status: DC | PRN

## 2012-07-12 NOTE — Progress Notes (Signed)
Patient ID: Kim Santiago, female   DOB: [birth year], 77 y.o.   MRN: [id number] Patient Active Problem List  Diagnosis  . Screening for osteoporosis  . Otitis media of left ear  . Hypertension  . Unspecified hypothyroidism  . Stress-induced cardiomyopathy    Subjective:  CC:   Chief Complaint  Patient presents with  . Sinus Problem    HPI:   Kim Santiago a 77 y.o. female who presents for followup on recent admission to [facility] on September 23 for chest pain. She ruled in for an acute myocardial infarction with a positive troponin of 1.8 and dynamic EKG changes. She underwent a cardiac catheterization by Dr. Kirke Corin which showed no occlusive coronary disease and an ejection fraction of 35%, consistent with stress-induced cardiomyopathy. Because of low blood pressures she was discharged home on a small dose of lisinopril only with no beta blocker. She has seen Dr. Kirke Corin since her discharge and the lisinopril was changed to losartan because of a dry cough. Beta blocker has been added and she is tolerating this well. She has had no subsequent episodes of chest pain. She denies any shortness of breath and lower extremity edema.  Her cc today is a 1 week history of upper respiratory symptoms. Her illness started with a sore sore throat followed by productive cough and frontal headache. She has been taking Mucinex D. with no significant change. She's having green nasal discharge and her eyes and forehead are still very painful and congested. Denies any documented fevers. No myalgias or neck pain,  no recent travel.    Past Medical History  Diagnosis Date  . Cancer     Thyroid  . Menopause 40s    natural, hot flashes and mood lability now gone, off prempro 7 months  . Osteoporosis   . hypothyroidism     secondary to thyroidectomy for thyroid ca  . Hypertension   . Myocardial infarction 2013  . Stress-induced cardiomyopathy September of 2013    EF 35%. Peak troponin was 1.8.    Past  Surgical History  Procedure Date  . Kyphosis surgery Feb 2008    L1, Dr. Gerrit Heck  . Cholecystectomy   . Tonsillectomy   . Tubal ligation   . Lumbar disc surgery     L4-L5  . Shoulder arthroscopy 2004    Left, Dr. Gavin Potters  . Thyroidectomy     Thyroid Cancer  . Spine surgery     L4-5 diskectomy  . Cardiac catheterization 05/2012    [facility]. No significant CAD. Ejection fraction of 35% due to stress-induced cardiomyopathy.         The following portions of the patient's history were reviewed and updated as appropriate: Allergies, current medications, and problem list.    Review of Systems:   12 Pt  review of systems was negative except those addressed in the HPI,     History   Social History  . Marital Status: Married    Spouse Name: N/A    Number of Children: N/A  . Years of Education: N/A   Occupational History  . Not on file.   Social History Main Topics  . Smoking status: Never Smoker   . Smokeless tobacco: Never Used  . Alcohol Use: No  . Drug Use: No  . Sexually Active: Not on file   Other Topics Concern  . Not on file   Social History Narrative  . No narrative on file    Objective:  BP 132/68  Pulse  71  Temp 98.1 F (36.7 C) (Oral)  Ht 5' 4.5" (1.638 m)  Wt 146 lb (66.225 kg)  BMI 24.67 kg/m2  SpO2 98%  General appearance: alert, cooperative and appears stated age Ears: bilateral TM erythena and effusions,  Frontal sinus tenderness Throat: lips, mucosa, and tongue normal; teeth and gums normal Neck: cervical adenopathy, no carotid bruit, supple, symmetrical, trachea midline and thyroid not enlarged, symmetric, no tenderness/mass/nodules Back: symmetric, no curvature. ROM normal. No CVA tenderness. Lungs: clear to auscultation bilaterally Heart: regular rate and rhythm, S1, S2 normal, no murmur, click, rub or gallop Abdomen: soft, non-tender; bowel sounds normal; no masses,  no organomegaly Pulses: 2+ and symmetric Skin: Skin color,  texture, turgor normal. No rashes or lesions Lymph nodes: Cervical, supraclavicular, and axillary nodes normal.  Assessment and Plan:  Stress-induced cardiomyopathy She is tolerating her new medications well with no adverse effects. Her cough is currently do to an upper respiratory infection.   Otitis media of left ear Secondary to prolonged sinus congestion. Empiric antibiotics decongestants and nasal rinses prescribed.  Screening for osteoporosis DEXA scan was done in 2000 111 and her worst T score was indicative of osteopenia at -1.4. She is taking calcium and vitamin D.   Updated Medication List Outpatient Encounter Prescriptions as of 07/12/2012  Medication Sig Dispense Refill  . aspirin 81 MG tablet Take 81 mg by mouth daily.      . Calcium Carbonate-Vitamin D (CALCIUM + D) 600-200 MG-UNIT TABS Take 1 tablet by mouth daily.        . cholecalciferol (VITAMIN D) 1000 UNITS tablet Take 1,000 Units by mouth daily.        Marland Kitchen levothyroxine (SYNTHROID, LEVOTHROID) 100 MCG tablet Take 100 mcg by mouth daily.      Marland Kitchen losartan (COZAAR) 25 MG tablet Take 0.5 tablets (12.5 mg total) by mouth daily.  30 tablet  3  . metoprolol succinate (TOPROL XL) 25 MG 24 hr tablet Take 1 tablet (25 mg total) by mouth daily.  30 tablet  3  . Multiple Vitamins-Minerals (OSTEO COMPLEX PO) Take one by mouth daily      . omeprazole (PRILOSEC) 20 MG capsule Take 1 capsule (20 mg total) by mouth daily.  90 capsule  3  . vitamin B-12 (CYANOCOBALAMIN) 1000 MCG tablet Take 1,000 mcg by mouth daily.      . famotidine (PEPCID) 20 MG tablet Take 20 mg by mouth 2 (two) times daily as needed.      . hydrochlorothiazide (HYDRODIURIL) 25 MG tablet Take 1 tablet (25 mg total) by mouth daily.  90 tablet  3  . HYDROcodone-acetaminophen (NORCO) 10-325 MG per tablet Take 1 tablet by mouth every 6 (six) hours as needed (cough).  30 tablet  0  . levofloxacin (LEVAQUIN) 500 MG tablet Take 1 tablet (500 mg total) by mouth daily.  7  tablet  0  . mometasone (NASONEX) 50 MCG/ACT nasal spray Place 2 sprays into the nose daily.  17 g  12     No orders of the defined types were placed in this encounter.    No Follow-up on file.

## 2012-07-12 NOTE — Patient Instructions (Addendum)
You have a sinus/ear infection   .  I am prescribing an antibiotic (levaquin)   To manage the infection in  your ear/sinuses.   I also advise use of the following OTC meds to help with your other symptoms.   Take generic OTC benadryl 25 mg every 8 hours for the drainage,  Afrin nasal spray for nighttime congestion  Continue the mucinex D  Or if you have  plain mucinex  And add sudafed  Or Sudafed PE (decongestant to unclog your your ears )   flushes your sinuses twice daily with Simply Saline (do over the sink because if you do it right you will spit out globs of mucus)  Gargle with salt water as needed for sore throat.   I will call in vicodin  For your evening cough (it is cheaper than tussionex)

## 2012-07-14 NOTE — Assessment & Plan Note (Addendum)
She is tolerating her new medications well with no adverse effects. Her cough is currently do to an upper respiratory infection.

## 2012-07-14 NOTE — Assessment & Plan Note (Signed)
DEXA scan was done in 2000 111 and her worst T score was indicative of osteopenia at -1.4. She is taking calcium and vitamin D.

## 2012-07-14 NOTE — Assessment & Plan Note (Signed)
Secondary to prolonged sinus congestion. Empiric antibiotics decongestants and nasal rinses prescribed.

## 2012-07-22 ENCOUNTER — Encounter: Payer: Self-pay | Admitting: Cardiovascular Disease

## 2012-07-22 ENCOUNTER — Ambulatory Visit (INDEPENDENT_AMBULATORY_CARE_PROVIDER_SITE_OTHER): Payer: Medicare Other | Admitting: Cardiovascular Disease

## 2012-07-22 VITALS — BP 136/76 | HR 80 | Ht 64.0 in | Wt 145.8 lb

## 2012-07-22 DIAGNOSIS — I5181 Takotsubo syndrome: Secondary | ICD-10-CM

## 2012-07-22 DIAGNOSIS — R0609 Other forms of dyspnea: Secondary | ICD-10-CM

## 2012-07-22 DIAGNOSIS — R0600 Dyspnea, unspecified: Secondary | ICD-10-CM

## 2012-07-22 DIAGNOSIS — I10 Essential (primary) hypertension: Secondary | ICD-10-CM

## 2012-07-22 DIAGNOSIS — R0989 Other specified symptoms and signs involving the circulatory and respiratory systems: Secondary | ICD-10-CM

## 2012-07-22 MED ORDER — LOSARTAN POTASSIUM 25 MG PO TABS: 25.0000 mg | ORAL_TABLET | Freq: Every day | ORAL | Status: DC

## 2012-07-22 MED ORDER — METOPROLOL SUCCINATE ER 25 MG PO TB24: 25.0000 mg | ORAL_TABLET | Freq: Every day | ORAL | Status: DC

## 2012-07-22 NOTE — Assessment & Plan Note (Signed)
Blood pressure is overall well controlled.

## 2012-07-22 NOTE — Assessment & Plan Note (Signed)
The patient seems to be doing well at this time. She is tolerating both Toprol and losartan. There is first degree AV block on her EKG and thus I will not increase the dose of Toprol. I will increase losartan to 25 mg once daily. I will request a followup echocardiogram in 2 months to reevaluate her LV systolic function. She still has residual dyspnea but otherwise seems to be stable.

## 2012-07-22 NOTE — Patient Instructions (Addendum)
Increase Losartan to 25 mg once daily.  Continue other medications.   Echo in 2 months.  Follow up after echo.

## 2012-07-22 NOTE — Progress Notes (Signed)
HPI  This is a 77 year old pleasant female who is here today for a followup visit. She presented in September chest discomfort suggestive of myocardial infarction. Her troponin peaked at 1.8 with a dynamic ST changes. She underwent cardiac catheterization which showed no significant coronary artery disease with evidence of stress-induced cardiomyopathy and ejection fraction of 35%. A true stressful event could not be determined from the patient.  She was discharged home on a small dose lisinopril which was subsequently changed to losartan due to cough. Small dose Toprol was added during the last visit. She is tolerating both medications with no hypotension. Overall, she is doing very well. She reports no chest pain. She describes moderate dyspnea with activities.  Allergies  Allergen Reactions  . Amoxicillin Rash  . Codeine   . Naprosyn (Naproxen) Swelling  . Orudis (Ketoprofen)   . Sulfathiazole Rash     Current Outpatient Prescriptions on File Prior to Visit  Medication Sig Dispense Refill  . aspirin 81 MG tablet Take 81 mg by mouth daily.      . Calcium Carbonate-Vitamin D (CALCIUM + D) 600-200 MG-UNIT TABS Take 1 tablet by mouth daily.        . cholecalciferol (VITAMIN D) 1000 UNITS tablet Take 1,000 Units by mouth daily.        . famotidine (PEPCID) 20 MG tablet Take 20 mg by mouth 2 (two) times daily as needed.      Marland Kitchen levothyroxine (SYNTHROID, LEVOTHROID) 100 MCG tablet Take 100 mcg by mouth daily.      . Multiple Vitamins-Minerals (OSTEO COMPLEX PO) Take one by mouth daily      . omeprazole (PRILOSEC) 20 MG capsule Take 1 capsule (20 mg total) by mouth daily.  90 capsule  3  . vitamin B-12 (CYANOCOBALAMIN) 1000 MCG tablet Take 1,000 mcg by mouth daily.      . [DISCONTINUED] losartan (COZAAR) 25 MG tablet Take 0.5 tablets (12.5 mg total) by mouth daily.  30 tablet  3  . [DISCONTINUED] metoprolol succinate (TOPROL XL) 25 MG 24 hr tablet Take 1 tablet (25 mg total) by mouth daily.   30 tablet  3     Past Medical History  Diagnosis Date  . Cancer     Thyroid  . Menopause 40s    natural, hot flashes and mood lability now gone, off prempro 7 months  . Osteoporosis   . hypothyroidism     secondary to thyroidectomy for thyroid ca  . Hypertension   . Myocardial infarction 2013  . Stress-induced cardiomyopathy September of 2013    EF 35%. Peak troponin was 1.8.     Past Surgical History  Procedure Date  . Kyphosis surgery Feb 2008    L1, Dr. Gerrit Heck  . Cholecystectomy   . Tonsillectomy   . Tubal ligation   . Lumbar disc surgery     L4-L5  . Shoulder arthroscopy 2004    Left, Dr. Gavin Potters  . Thyroidectomy     Thyroid Cancer  . Spine surgery     L4-5 diskectomy  . Cardiac catheterization 05/2012    [facility]. No significant CAD. Ejection fraction of 35% due to stress-induced cardiomyopathy.     Family History  Problem Relation Age of Onset  . COPD Father   . Cancer Father     esophageal  . Kidney disease Mother   . Heart disease Mother   . Kidney disease Sister   . Cancer Brother 66    colon cancer (both brothers)  History   Social History  . Marital Status: Married    Spouse Name: N/A    Number of Children: N/A  . Years of Education: N/A   Occupational History  . Not on file.   Social History Main Topics  . Smoking status: Never Smoker   . Smokeless tobacco: Never Used  . Alcohol Use: No  . Drug Use: No  . Sexually Active: Not on file   Other Topics Concern  . Not on file   Social History Narrative  . No narrative on file       PHYSICAL EXAM   BP 136/76  Pulse 80  Ht 5\' 4"  (1.626 m)  Wt 145 lb 12 oz (66.112 kg)  BMI 25.02 kg/m2  Constitutional: She is oriented to person, place, and time. She appears well-developed and well-nourished. No distress.  HENT: No nasal discharge.  Head: Normocephalic and atraumatic.  Eyes: Pupils are equal and round. Right eye exhibits no discharge. Left eye exhibits no discharge.  Neck:  Normal range of motion. Neck supple. No JVD present. No thyromegaly present.  Cardiovascular: Normal rate, regular rhythm, normal heart sounds. Exam reveals no gallop and no friction rub. No murmur heard.  Pulmonary/Chest: Effort normal and breath sounds normal. No stridor. No respiratory distress. She has no wheezes. She has no rales. She exhibits no tenderness.  Abdominal: Soft. Bowel sounds are normal. She exhibits no distension. There is no tenderness. There is no rebound and no guarding.  Musculoskeletal: Normal range of motion. She exhibits no edema and no tenderness.  Neurological: She is alert and oriented to person, place, and time. Coordination normal.  Skin: Skin is warm and dry. No rash noted. She is not diaphoretic. No erythema. No pallor.  Psychiatric: She has a normal mood and affect. Her behavior is normal. Judgment and thought content normal.  No groin hematoma.  EKG: Sinus  Rhythm  -First degree A-V block  PRi = 228 -  Nonspecific T-abnormality.   Low voltage -possible pulmonary disease.   ABNORMAL    ASSESSMENT AND PLAN

## 2012-09-20 ENCOUNTER — Other Ambulatory Visit (INDEPENDENT_AMBULATORY_CARE_PROVIDER_SITE_OTHER): Payer: Medicare Other

## 2012-09-20 ENCOUNTER — Other Ambulatory Visit: Payer: Self-pay

## 2012-09-20 DIAGNOSIS — R0600 Dyspnea, unspecified: Secondary | ICD-10-CM

## 2012-09-20 DIAGNOSIS — R0989 Other specified symptoms and signs involving the circulatory and respiratory systems: Secondary | ICD-10-CM

## 2012-09-20 DIAGNOSIS — R0609 Other forms of dyspnea: Secondary | ICD-10-CM

## 2012-09-26 ENCOUNTER — Other Ambulatory Visit: Payer: Self-pay | Admitting: Internal Medicine

## 2012-09-26 MED ORDER — LEVOTHYROXINE SODIUM 100 MCG PO TABS: 100.0000 ug | ORAL_TABLET | Freq: Every day | ORAL | Status: DC

## 2012-09-26 MED ORDER — OMEPRAZOLE 20 MG PO CPDR: 20.0000 mg | DELAYED_RELEASE_CAPSULE | Freq: Every day | ORAL | Status: DC

## 2012-09-26 NOTE — Telephone Encounter (Signed)
omeprazole (PRILOSEC) 20 MG capsule  #90   levothyroxine (SYNTHROID, LEVOTHROID) 88 MCG tablet   #90

## 2012-09-26 NOTE — Telephone Encounter (Signed)
Med filled.  

## 2012-09-27 ENCOUNTER — Ambulatory Visit: Payer: Medicare Other | Admitting: Cardiovascular Disease

## 2012-09-30 ENCOUNTER — Encounter: Payer: Self-pay | Admitting: Cardiovascular Disease

## 2012-09-30 ENCOUNTER — Ambulatory Visit (INDEPENDENT_AMBULATORY_CARE_PROVIDER_SITE_OTHER): Payer: Medicare Other | Admitting: Cardiovascular Disease

## 2012-09-30 VITALS — BP 140/60 | HR 80 | Ht 64.0 in | Wt 146.5 lb

## 2012-09-30 DIAGNOSIS — I10 Essential (primary) hypertension: Secondary | ICD-10-CM

## 2012-09-30 DIAGNOSIS — I5181 Takotsubo syndrome: Secondary | ICD-10-CM

## 2012-09-30 NOTE — Assessment & Plan Note (Signed)
Blood pressure is well controlled on current medications. 

## 2012-09-30 NOTE — Patient Instructions (Addendum)
Continue same medications.  Follow up in 6 months.  

## 2012-09-30 NOTE — Assessment & Plan Note (Signed)
She is doing very well. Her ejection fraction is back to normal. I recommend continuing treatment with current dose of Toprol and losartan.  I advised her to avoid stressful events. She reports having some chest discomfort that is more suggestive of GERD. Cardiac catheterization last year showed no evidence of CAD.

## 2012-09-30 NOTE — Progress Notes (Signed)
HPI  This is a 78 year old pleasant female who is here today for a followup visit. She presented in September of last year with chest discomfort suggestive of myocardial infarction. Her troponin peaked at 1.8 with a dynamic ST changes. She underwent cardiac catheterization which showed no significant coronary artery disease with evidence of stress-induced cardiomyopathy and ejection fraction of 35%. A true stressful event could not be determined from the patient.  She was discharged home on a small dose lisinopril which was subsequently changed to losartan due to cough. Small dose Toprol was added during the last visit. She is tolerating both medications.  Overall, she is doing very well. She reports no chest pain. She describes moderate dyspnea with activities.  She had a followup echocardiogram recently which showed that her ejection fraction is back to normal.  Allergies  Allergen Reactions  . Amoxicillin Rash  . Codeine   . Naprosyn (Naproxen) Swelling  . Orudis (Ketoprofen)   . Sulfathiazole Rash     Current Outpatient Prescriptions on File Prior to Visit  Medication Sig Dispense Refill  . aspirin 81 MG tablet Take 81 mg by mouth daily.      . Calcium Carbonate-Vitamin D (CALCIUM + D) 600-200 MG-UNIT TABS Take 1 tablet by mouth daily.        . cholecalciferol (VITAMIN D) 1000 UNITS tablet Take 1,000 Units by mouth daily.        . famotidine (PEPCID) 20 MG tablet Take 20 mg by mouth 2 (two) times daily as needed.      Marland Kitchen levothyroxine (SYNTHROID, LEVOTHROID) 100 MCG tablet Take 1 tablet (100 mcg total) by mouth daily.  90 tablet  3  . losartan (COZAAR) 25 MG tablet Take 1 tablet (25 mg total) by mouth daily.  90 tablet  3  . metoprolol succinate (TOPROL XL) 25 MG 24 hr tablet Take 1 tablet (25 mg total) by mouth daily.  90 tablet  3  . mometasone (NASONEX) 50 MCG/ACT nasal spray Place 2 sprays into the nose as needed.      . Multiple Vitamins-Minerals (OSTEO COMPLEX PO) Take one by  mouth daily      . omeprazole (PRILOSEC) 20 MG capsule Take 1 capsule (20 mg total) by mouth daily.  90 capsule  3  . vitamin B-12 (CYANOCOBALAMIN) 1000 MCG tablet Take 1,000 mcg by mouth daily.         Past Medical History  Diagnosis Date  . Cancer     Thyroid  . Menopause 40s    natural, hot flashes and mood lability now gone, off prempro 7 months  . Osteoporosis   . hypothyroidism     secondary to thyroidectomy for thyroid ca  . Hypertension   . Myocardial infarction 2013  . Stress-induced cardiomyopathy September of 2013    EF 35%. Peak troponin was 1.8.     Past Surgical History  Procedure Date  . Kyphosis surgery Feb 2008    L1, Dr. Gerrit Heck  . Cholecystectomy   . Tonsillectomy   . Tubal ligation   . Lumbar disc surgery     L4-L5  . Shoulder arthroscopy 2004    Left, Dr. Gavin Potters  . Thyroidectomy     Thyroid Cancer  . Spine surgery     L4-5 diskectomy  . Cardiac catheterization 05/2012    [facility]. No significant CAD. Ejection fraction of 35% due to stress-induced cardiomyopathy.     Family History  Problem Relation Age of Onset  . COPD  Father   . Cancer Father     esophageal  . Kidney disease Mother   . Heart disease Mother   . Kidney disease Sister   . Cancer Brother 5    colon cancer (both brothers)     History   Social History  . Marital Status: Married    Spouse Name: N/A    Number of Children: N/A  . Years of Education: N/A   Occupational History  . Not on file.   Social History Main Topics  . Smoking status: Never Smoker   . Smokeless tobacco: Never Used  . Alcohol Use: No  . Drug Use: No  . Sexually Active: Not on file   Other Topics Concern  . Not on file   Social History Narrative  . No narrative on file       PHYSICAL EXAM   BP 140/60  Pulse 80  Ht 5\' 4"  (1.626 m)  Wt 146 lb 8 oz (66.452 kg)  BMI 25.15 kg/m2  Constitutional: She is oriented to person, place, and time. She appears well-developed and well-nourished.  No distress.  HENT: No nasal discharge.  Head: Normocephalic and atraumatic.  Eyes: Pupils are equal and round. Right eye exhibits no discharge. Left eye exhibits no discharge.  Neck: Normal range of motion. Neck supple. No JVD present. No thyromegaly present.  Cardiovascular: Normal rate, regular rhythm, normal heart sounds. Exam reveals no gallop and no friction rub. No murmur heard.  Pulmonary/Chest: Effort normal and breath sounds normal. No stridor. No respiratory distress. She has no wheezes. She has no rales. She exhibits no tenderness.  Abdominal: Soft. Bowel sounds are normal. She exhibits no distension. There is no tenderness. There is no rebound and no guarding.  Musculoskeletal: Normal range of motion. She exhibits no edema and no tenderness.  Neurological: She is alert and oriented to person, place, and time. Coordination normal.  Skin: Skin is warm and dry. No rash noted. She is not diaphoretic. No erythema. No pallor.  Psychiatric: She has a normal mood and affect. Her behavior is normal. Judgment and thought content normal.  No groin hematoma.    ASSESSMENT AND PLAN

## 2012-10-10 ENCOUNTER — Ambulatory Visit (INDEPENDENT_AMBULATORY_CARE_PROVIDER_SITE_OTHER): Payer: Medicare Other | Admitting: Internal Medicine

## 2012-10-10 ENCOUNTER — Encounter: Payer: Self-pay | Admitting: Internal Medicine

## 2012-10-10 VITALS — BP 128/62 | HR 82 | Temp 98.2°F | Resp 16 | Wt 148.5 lb

## 2012-10-10 DIAGNOSIS — J3489 Other specified disorders of nose and nasal sinuses: Secondary | ICD-10-CM

## 2012-10-10 DIAGNOSIS — R0981 Nasal congestion: Secondary | ICD-10-CM | POA: Insufficient documentation

## 2012-10-10 MED ORDER — PREDNISONE (PAK) 10 MG PO TABS: ORAL_TABLET | ORAL | Status: DC

## 2012-10-10 NOTE — Patient Instructions (Addendum)
Let's try a 6 day prednisone taper along with OTC Sudafed PE 10 mg every 6 hours while awake.   If no improvement in one week.  Call for ENT referral. To Dr Jacinto Reap in [place] office

## 2012-10-10 NOTE — Assessment & Plan Note (Signed)
Prednisone/sudafed fore one week.  If there is o improvement,  Discussed ENT referral ,  No indication for antibiotics.

## 2012-10-10 NOTE — Progress Notes (Signed)
Patient ID: Kim Santiago, female   DOB: [birth year], 78 y.o.   MRN: [id number]  Patient Active Problem List  Diagnosis  . Screening for osteoporosis  . Otitis media of left ear  . Hypertension  . Unspecified hypothyroidism  . Stress-induced cardiomyopathy  . Nasal sinus congestion    Subjective:  CC:   Chief Complaint  Patient presents with  . Sinusitis    HPI:   Kim Santiago a 78 y.o. female who presents with a  3 week history of URI ,  Entire head has been congested  Left side continually congested , Sinuses full ,  Has dry mouth at night,  Some snoring.  Using saline flush and it doesn't come out the other side or coming out her mouth so she is concerned that she has a blocked sinus.  Has some bloody yellow sinus drainage in the morning only. Dentist would not comment on dry mouth but offered a flouride procedure  .     Past Medical History  Diagnosis Date  . Cancer     Thyroid  . Menopause 40s    natural, hot flashes and mood lability now gone, off prempro 7 months  . Osteoporosis   . hypothyroidism     secondary to thyroidectomy for thyroid ca  . Hypertension   . Myocardial infarction 2013  . Stress-induced cardiomyopathy September of 2013    EF 35%. Peak troponin was 1.8.    Past Surgical History  Procedure Date  . Kyphosis surgery Feb 2008    L1, Dr. Gerrit Heck  . Cholecystectomy   . Tonsillectomy   . Tubal ligation   . Lumbar disc surgery     L4-L5  . Shoulder arthroscopy 2004    Left, Dr. Gavin Potters  . Thyroidectomy     Thyroid Cancer  . Spine surgery     L4-5 diskectomy  . Cardiac catheterization 05/2012    [facility]. No significant CAD. Ejection fraction of 35% due to stress-induced cardiomyopathy.         The following portions of the patient's history were reviewed and updated as appropriate: Allergies, current medications, and problem list.    Review of Systems:   Patient denies headache, fevers, malaise, unintentional weight loss, skin  rash, eye pain, sinus congestion and sinus pain, sore throat, dysphagia,  hemoptysis , cough, dyspnea, wheezing, chest pain, palpitations, orthopnea, edema, abdominal pain, nausea, melena, diarrhea, constipation, flank pain, dysuria, hematuria, urinary  Frequency, nocturia, numbness, tingling, seizures,  Focal weakness, Loss of consciousness,  Tremor, insomnia, depression, anxiety, and suicidal ideation.      History   Social History  . Marital Status: Married    Spouse Name: N/A    Number of Children: N/A  . Years of Education: N/A   Occupational History  . Not on file.   Social History Main Topics  . Smoking status: Never Smoker   . Smokeless tobacco: Never Used  . Alcohol Use: No  . Drug Use: No  . Sexually Active: Not on file   Other Topics Concern  . Not on file   Social History Narrative  . No narrative on file    Objective:  BP 128/62  Pulse 82  Temp 98.2 F (36.8 C) (Oral)  Resp 16  Wt 148 lb 8 oz (67.359 kg)  SpO2 96%  General appearance: alert, cooperative and appears stated age Ears: normal TM's and external ear canals both  Sinuses: maxillary tenderness bilateral Throat: lips, mucosa, and tongue normal; teeth and  gums normal Neck: no adenopathy, no carotid bruit, supple, symmetrical, trachea midline and thyroid not enlarged, symmetric, no tenderness/mass/nodules Back: symmetric, no curvature. ROM normal. No CVA tenderness. Lungs: clear to auscultation bilaterally Heart: regular rate and rhythm, S1, S2 normal, no murmur, click, rub or gallop Skin: Skin color, texture, turgor normal. No rashes or lesions Lymph nodes: Cervical, supraclavicular, and axillary nodes normal.  Assessment and Plan:  Nasal sinus congestion Prednisone/sudafed fore one week.  If there is o improvement,  Discussed ENT referral ,  No indication for antibiotics.    Updated Medication List Outpatient Encounter Prescriptions as of 10/10/2012  Medication Sig Dispense Refill  .  aspirin 81 MG tablet Take 81 mg by mouth daily.      . Calcium Carbonate-Vitamin D (CALCIUM + D) 600-200 MG-UNIT TABS Take 1 tablet by mouth daily.        . cholecalciferol (VITAMIN D) 1000 UNITS tablet Take 1,000 Units by mouth daily.        . famotidine (PEPCID) 20 MG tablet Take 20 mg by mouth 2 (two) times daily as needed.      Marland Kitchen levothyroxine (SYNTHROID, LEVOTHROID) 100 MCG tablet Take 1 tablet (100 mcg total) by mouth daily.  90 tablet  3  . losartan (COZAAR) 25 MG tablet Take 1 tablet (25 mg total) by mouth daily.  90 tablet  3  . metoprolol succinate (TOPROL XL) 25 MG 24 hr tablet Take 1 tablet (25 mg total) by mouth daily.  90 tablet  3  . Multiple Vitamins-Minerals (OSTEO COMPLEX PO) Take one by mouth daily      . omeprazole (PRILOSEC) 20 MG capsule Take 1 capsule (20 mg total) by mouth daily.  90 capsule  3  . vitamin B-12 (CYANOCOBALAMIN) 1000 MCG tablet Take 1,000 mcg by mouth daily.      . mometasone (NASONEX) 50 MCG/ACT nasal spray Place 2 sprays into the nose as needed.      . predniSONE (STERAPRED UNI-PAK) 10 MG tablet 6 tablets on Day 1 , then reduce by 1 tablet daily until gone  21 tablet  0     No orders of the defined types were placed in this encounter.    No Follow-up on file.

## 2012-11-24 ENCOUNTER — Encounter: Payer: Self-pay | Admitting: Internal Medicine

## 2012-11-24 ENCOUNTER — Telehealth: Payer: Self-pay | Admitting: General Practice

## 2012-11-24 NOTE — Telephone Encounter (Signed)
She can take the 100 mcg daily dose and we will check her thyroid function next month

## 2012-11-24 NOTE — Telephone Encounter (Signed)
Pt called stating that about a year ago she had been switched to Levothyroxine . When her last meds were mailed to her by mail order she received the . Pt states she is fine with taking the and has an appt with you next month. Please advise what she should do.

## 2012-11-25 NOTE — Telephone Encounter (Signed)
Pt.notified

## 2012-12-27 ENCOUNTER — Ambulatory Visit (INDEPENDENT_AMBULATORY_CARE_PROVIDER_SITE_OTHER): Payer: Medicare Other | Admitting: Internal Medicine

## 2012-12-27 ENCOUNTER — Encounter: Payer: Self-pay | Admitting: Internal Medicine

## 2012-12-27 VITALS — BP 112/62 | HR 91 | Temp 97.6°F | Resp 16 | Ht 64.0 in | Wt 149.8 lb

## 2012-12-27 DIAGNOSIS — R609 Edema, unspecified: Secondary | ICD-10-CM

## 2012-12-27 DIAGNOSIS — E559 Vitamin D deficiency, unspecified: Secondary | ICD-10-CM

## 2012-12-27 DIAGNOSIS — Z1239 Encounter for other screening for malignant neoplasm of breast: Secondary | ICD-10-CM

## 2012-12-27 DIAGNOSIS — R0981 Nasal congestion: Secondary | ICD-10-CM

## 2012-12-27 DIAGNOSIS — I5181 Takotsubo syndrome: Secondary | ICD-10-CM

## 2012-12-27 DIAGNOSIS — N3941 Urge incontinence: Secondary | ICD-10-CM

## 2012-12-27 DIAGNOSIS — Z79899 Other long term (current) drug therapy: Secondary | ICD-10-CM

## 2012-12-27 DIAGNOSIS — I10 Essential (primary) hypertension: Secondary | ICD-10-CM

## 2012-12-27 DIAGNOSIS — J3489 Other specified disorders of nose and nasal sinuses: Secondary | ICD-10-CM

## 2012-12-27 DIAGNOSIS — E785 Hyperlipidemia, unspecified: Secondary | ICD-10-CM

## 2012-12-27 DIAGNOSIS — E039 Hypothyroidism, unspecified: Secondary | ICD-10-CM

## 2012-12-27 DIAGNOSIS — Z0000 Encounter for general adult medical examination without abnormal findings: Secondary | ICD-10-CM

## 2012-12-27 DIAGNOSIS — R350 Frequency of micturition: Secondary | ICD-10-CM

## 2012-12-27 DIAGNOSIS — Z1211 Encounter for screening for malignant neoplasm of colon: Secondary | ICD-10-CM

## 2012-12-27 MED ORDER — LEVOTHYROXINE SODIUM 88 MCG PO TABS: 88.0000 ug | ORAL_TABLET | Freq: Every day | ORAL | Status: DC

## 2012-12-27 MED ORDER — FUROSEMIDE 20 MG PO TABS: ORAL_TABLET | ORAL | Status: DC

## 2012-12-27 MED ORDER — TETANUS-DIPHTH-ACELL PERTUSSIS 5-2.5-18.5 LF-MCG/0.5 IM SUSP: 0.5000 mL | Freq: Once | INTRAMUSCULAR | Status: DC

## 2012-12-27 NOTE — Progress Notes (Signed)
Patient ID: Kim Santiago, female   DOB: 1934/10/24, 78 y.o.   MRN: [id number]  The patient is here for annual Medicare wellness examination and management of other chronic and acute problems.   The risk factors are reflected in the social history.  The roster of all physicians providing medical care to patient - is listed in the Snapshot section of the chart.  Activities of daily living:  The patient is 100% independent in all ADLs: dressing, toileting, feeding as well as independent mobility  Home safety : The patient has smoke detectors in the home. They wear seatbelts.  There are no firearms at home. There is no violence in the home.   There is no risks for hepatitis, STDs or HIV. There is no   history of blood transfusion. They have no travel history to infectious disease endemic areas of the world.  The patient has seen their dentist in the last six month. They have seen their eye doctor in the last year. They admit to slight hearing difficulty with regard to whispered voices and some television programs.  They have deferred audiologic testing in the last year.  They do not  have excessive sun exposure. Discussed the need for sun protection: hats, long sleeves and use of sunscreen if there is significant sun exposure.   Diet: the importance of a healthy diet is discussed. They do have a healthy diet.  The benefits of regular aerobic exercise were discussed. She walks 4 times per week ,  20 minutes.   Depression screen: there are no signs or vegative symptoms of depression- irritability, change in appetite, anhedonia, sadness/tearfullness.  Cognitive assessment: the patient manages all their financial and personal affairs and is actively engaged. They could relate day,date,year and events; recalled 2/3 objects at 3 minutes; performed clock-face test normally.  The following portions of the patient's history were reviewed and updated as appropriate: allergies, current medications, past  family history, past medical history,  past surgical history, past social history  and problem list.  Visual acuity was not assessed per patient preference since she has regular follow up with her ophthalmologist. Hearing and body mass index were assessed and reviewed.   During the course of the visit the patient was educated and counseled about appropriate screening and preventive services including : fall prevention , diabetes screening, nutrition counseling, colorectal cancer screening, and recommended immunizations.     Objective:  BP 112/62  Pulse 91  Temp(Src) 97.6 F (36.4 C) (Oral)  Resp 16  Ht 5\' 4"  (1.626 m)  Wt 149 lb 12 oz (67.926 kg)  BMI 25.69 kg/m2  SpO2 97%  General Appearance:    Alert, cooperative, no distress, appears stated age  Head:    Normocephalic, without obvious abnormality, atraumatic  Eyes:    PERRL, conjunctiva/corneas clear, EOM's intact, fundi    benign, both eyes  Ears:    Normal TM's and external ear canals, both ears  Nose:   Nares normal, septum midline, mucosa normal, no drainage    or sinus tenderness  Throat:   Lips, mucosa, and tongue normal; teeth and gums normal  Neck:   Supple, symmetrical, trachea midline, no adenopathy;    thyroid:  no enlargement/tenderness/nodules; no carotid   bruit or JVD  Back:     Symmetric, no curvature, ROM normal, no CVA tenderness  Lungs:     Clear to auscultation bilaterally, respirations unlabored  Chest Wall:    No tenderness or deformity   Heart:  Regular rate and rhythm, S1 and S2 normal, no murmur, rub   or gallop  Breast Exam:    No tenderness, masses, or nipple abnormality  Abdomen:     Soft, non-tender, bowel sounds active all four quadrants,    no masses, no organomegaly  Genitalia:    Normal female without lesion, discharge or tenderness  Extremities:   Extremities normal, atraumatic, no cyanosis or edema  Pulses:   2+ and symmetric all extremities  Skin:   Skin color, texture, turgor normal,  no rashes or lesions  Lymph nodes:   Cervical, supraclavicular, and axillary nodes normal  Neurologic:   CNII-XII intact, normal strength, sensation and reflexes    throughout   Assessment and Plan:  Edema Advise to consider use of compression stockings and prn furosemide.  Her EF was normal by repeat ECHO  Urge incontinence Infection ruled out as cause. She requested medication.  But after discussing side effects of medications. She has declined.  Nasal sinus congestion Csymptoms have now been present for nearly 3 months , causing snoring at night, recommended referral to ENT for evaluation of sinuses  Hypertension Well controlled on current regimen. Renal function stable, no changes today.  Other and unspecified hyperlipidemia Mild, LDL is < 120.  Will discuss trial of statin.   Routine general medical examination at a health care facility Annual comprehensive exam was done including breast, pelvic. All screenings have been addressed .    Updated Medication List Outpatient Encounter Prescriptions as of 12/27/2012  Medication Sig Dispense Refill  . aspirin 81 MG tablet Take 81 mg by mouth daily.      . Calcium Carbonate-Vitamin D (CALCIUM + D) 600-200 MG-UNIT TABS Take 1 tablet by mouth daily.        . cholecalciferol (VITAMIN D) 1000 UNITS tablet Take 1,000 Units by mouth daily.        Marland Kitchen levothyroxine (SYNTHROID, LEVOTHROID) 88 MCG tablet Take 1 tablet (88 mcg total) by mouth daily.  90 tablet  3  . losartan (COZAAR) 25 MG tablet Take 1 tablet (25 mg total) by mouth daily.  90 tablet  3  . metoprolol succinate (TOPROL XL) 25 MG 24 hr tablet Take 1 tablet (25 mg total) by mouth daily.  90 tablet  3  . Multiple Vitamins-Minerals (OSTEO COMPLEX PO) Take one by mouth daily      . omeprazole (PRILOSEC) 20 MG capsule Take 1 capsule (20 mg total) by mouth daily.  90 capsule  3  . polyethylene glycol (MIRALAX / GLYCOLAX) packet Take 17 g by mouth daily.      . vitamin B-12  (CYANOCOBALAMIN) 1000 MCG tablet Take 1,000 mcg by mouth daily.      . [DISCONTINUED] levothyroxine (SYNTHROID, LEVOTHROID) 100 MCG tablet Take 1 tablet (100 mcg total) by mouth daily.  90 tablet  3  . famotidine (PEPCID) 20 MG tablet Take 20 mg by mouth 2 (two) times daily as needed.      . furosemide (LASIX) 20 MG tablet Once daily as needed for fluid retention  30 tablet  3  . mometasone (NASONEX) 50 MCG/ACT nasal spray Place 2 sprays into the nose as needed.      . predniSONE (STERAPRED UNI-PAK) 10 MG tablet 6 tablets on Day 1 , then reduce by 1 tablet daily until gone  21 tablet  0  . TDaP (BOOSTRIX) 5-2.5-18.5 LF-MCG/0.5 injection Inject 0.5 mLs into the muscle once.  0.5 mL  0  . [DISCONTINUED] furosemide (LASIX)  20 MG tablet Once daily as needed for fluid retention  30 tablet  3   No facility-administered encounter medications on file as of 12/27/2012.

## 2012-12-27 NOTE — Patient Instructions (Addendum)
I recommend getting your TDaP vaccine this year  Furosemide,   A diuretic to use AS NEEDED for fluid retention (sent to [facility]  Mammogram ordered  referral to Dr Kerby Nora al for sinus evaluation when you are ready

## 2012-12-28 DIAGNOSIS — N3941 Urge incontinence: Secondary | ICD-10-CM | POA: Insufficient documentation

## 2012-12-28 DIAGNOSIS — E785 Hyperlipidemia, unspecified: Secondary | ICD-10-CM | POA: Insufficient documentation

## 2012-12-28 DIAGNOSIS — Z0000 Encounter for general adult medical examination without abnormal findings: Secondary | ICD-10-CM | POA: Insufficient documentation

## 2012-12-28 LAB — COMPREHENSIVE METABOLIC PANEL
ALT: 7 U/L (ref 0–35)
Alkaline Phosphatase: 79 U/L (ref 39–117)
Creatinine, Ser: 0.8 mg/dL (ref 0.4–1.2)
Sodium: 139 mEq/L (ref 135–145)
Total Bilirubin: 0.5 mg/dL (ref 0.3–1.2)
Total Protein: 7.1 g/dL (ref 6.0–8.3)

## 2012-12-28 LAB — CBC WITH DIFFERENTIAL/PLATELET
Basophils Absolute: 0 10*3/uL (ref 0.0–0.1)
Eosinophils Absolute: 0.2 10*3/uL (ref 0.0–0.7)
HCT: 38 % (ref 36.0–46.0)
Hemoglobin: 13.1 g/dL (ref 12.0–15.0)
Lymphocytes Relative: 27.7 % (ref 12.0–46.0)
Lymphs Abs: 1.7 10*3/uL (ref 0.7–4.0)
MCHC: 34.4 g/dL (ref 30.0–36.0)
MCV: 89.2 fl (ref 78.0–100.0)
Monocytes Absolute: 0.5 10*3/uL (ref 0.1–1.0)
Neutro Abs: 3.6 10*3/uL (ref 1.4–7.7)
RDW: 13.9 % (ref 11.5–14.6)

## 2012-12-28 LAB — URINALYSIS, ROUTINE W REFLEX MICROSCOPIC
Bilirubin Urine: NEGATIVE
Hgb urine dipstick: NEGATIVE
Ketones, ur: NEGATIVE
Urobilinogen, UA: 0.2 (ref 0.0–1.0)

## 2012-12-28 NOTE — Assessment & Plan Note (Signed)
Mild, LDL is < 120.  Will discuss trial of statin.

## 2012-12-28 NOTE — Assessment & Plan Note (Signed)
Infection ruled out as cause. She requested medication.  But after discussing side effects of medications. She has declined.

## 2012-12-28 NOTE — Assessment & Plan Note (Signed)
Csymptoms have now been present for nearly 3 months , causing snoring at night, recommended referral to ENT for evaluation of sinuses

## 2012-12-28 NOTE — Assessment & Plan Note (Signed)
Annual comprehensive exam was done including breast, pelvic. All screenings have been addressed .

## 2012-12-28 NOTE — Assessment & Plan Note (Signed)
Advise to consider use of compression stockings and prn furosemide.  Her EF was normal by repeat ECHO

## 2012-12-28 NOTE — Assessment & Plan Note (Signed)
Well controlled on current regimen. Renal function stable, no changes today. 

## 2013-01-10 ENCOUNTER — Ambulatory Visit: Payer: Self-pay | Admitting: Internal Medicine

## 2013-02-03 ENCOUNTER — Encounter: Payer: Self-pay | Admitting: Internal Medicine

## 2013-02-21 ENCOUNTER — Encounter: Payer: Self-pay | Admitting: Emergency Medicine

## 2013-03-21 ENCOUNTER — Encounter: Payer: Self-pay | Admitting: Adult Health

## 2013-03-21 ENCOUNTER — Ambulatory Visit (INDEPENDENT_AMBULATORY_CARE_PROVIDER_SITE_OTHER): Payer: Medicare Other | Admitting: Adult Health

## 2013-03-21 VITALS — BP 118/66 | HR 88 | Temp 98.4°F | Resp 14 | Wt 147.5 lb

## 2013-03-21 DIAGNOSIS — H109 Unspecified conjunctivitis: Secondary | ICD-10-CM

## 2013-03-21 DIAGNOSIS — J029 Acute pharyngitis, unspecified: Secondary | ICD-10-CM

## 2013-03-21 MED ORDER — POLYMYXIN B-TRIMETHOPRIM 10000-0.1 UNIT/ML-% OP SOLN: 1.0000 [drp] | OPHTHALMIC | Status: DC

## 2013-03-21 MED ORDER — AZITHROMYCIN 250 MG PO TABS: ORAL_TABLET | ORAL | Status: DC

## 2013-03-21 NOTE — Progress Notes (Signed)
Subjective:    Patient ID: Kim Santiago, female    DOB: 12/09/1934, 78 y.o.   MRN: [id number]  HPI  Patient is a 78 y/o female who presents to clinic with sore throat, green drainage and cough. She also has irritation, redness and drainage of her left eye. Symptoms began on Friday. She has had exposure to sickness. Both granddaughter have been sick with same symptoms. Patient has had low grade fever. No shortness of breath.   Current Outpatient Prescriptions on File Prior to Visit  Medication Sig Dispense Refill  . aspirin 81 MG tablet Take 81 mg by mouth daily.      . Calcium Carbonate-Vitamin D (CALCIUM + D) 600-200 MG-UNIT TABS Take 1 tablet by mouth daily.        . cholecalciferol (VITAMIN D) 1000 UNITS tablet Take 1,000 Units by mouth daily.        . famotidine (PEPCID) 20 MG tablet Take 20 mg by mouth 2 (two) times daily as needed.      . furosemide (LASIX) 20 MG tablet Once daily as needed for fluid retention  30 tablet  3  . levothyroxine (SYNTHROID, LEVOTHROID) 88 MCG tablet Take 1 tablet (88 mcg total) by mouth daily.  90 tablet  3  . losartan (COZAAR) 25 MG tablet Take 1 tablet (25 mg total) by mouth daily.  90 tablet  3  . metoprolol succinate (TOPROL XL) 25 MG 24 hr tablet Take 1 tablet (25 mg total) by mouth daily.  90 tablet  3  . mometasone (NASONEX) 50 MCG/ACT nasal spray Place 2 sprays into the nose as needed.      . Multiple Vitamins-Minerals (OSTEO COMPLEX PO) Take one by mouth daily      . omeprazole (PRILOSEC) 20 MG capsule Take 1 capsule (20 mg total) by mouth daily.  90 capsule  3  . polyethylene glycol (MIRALAX / GLYCOLAX) packet Take 17 g by mouth daily.      . vitamin B-12 (CYANOCOBALAMIN) 1000 MCG tablet Take 1,000 mcg by mouth daily.       No current facility-administered medications on file prior to visit.      Review of Systems  Constitutional: Positive for fever and fatigue.  HENT: Positive for sore throat, postnasal drip and sinus pressure.    Respiratory: Positive for cough.     BP 118/66  Pulse 88  Temp(Src) 98.4 F (36.9 C) (Oral)  Resp 14  Wt 147 lb 8 oz (66.906 kg)  BMI 25.31 kg/m2  SpO2 98%    Objective:   Physical Exam  Constitutional: She is oriented to person, place, and time. She appears well-developed and well-nourished. No distress.  HENT:  Head: Normocephalic and atraumatic.  Right Ear: External ear normal.  Left Ear: External ear normal.  Mouth/Throat: No oropharyngeal exudate.  Eyes: Pupils are equal, round, and reactive to light.  Left eye - conjunctivitis  Neck: Normal range of motion. Neck supple.  Cardiovascular: Normal rate, regular rhythm, normal heart sounds and intact distal pulses.  Exam reveals no gallop and no friction rub.   No murmur heard. Pulmonary/Chest: Effort normal and breath sounds normal. No respiratory distress. She has no wheezes. She has no rales.  Lymphadenopathy:    She has cervical adenopathy.  Neurological: She is alert and oriented to person, place, and time.  Skin: Skin is warm and dry.  Psychiatric: She has a normal mood and affect. Her behavior is normal. Judgment and thought content normal.  Assessment & Plan:

## 2013-03-21 NOTE — Patient Instructions (Addendum)
   Start Azithromycin 250mg  - take 2 tablets today (500 mg) then take 1 tablet daily for the next 4 days.  For your sore throat - use salt water solution to gargle. You can also use chloraseptic spray.  Drink fluids to maintain hydration.  Rest as much as possible.  Apply 1 drop of polytrim eye drops into the affected eye every 4 hours for 5-7 days.

## 2013-03-21 NOTE — Assessment & Plan Note (Signed)
Symptoms started Friday. Exposure to sick contacts. Patient with low grade fever and green drainage. Start Azithromycin. Chloraseptic spray or lozenges. Salt water gargles.

## 2013-03-21 NOTE — Assessment & Plan Note (Signed)
Left eye. Start Polytrim drops to affected eye.

## 2013-03-31 ENCOUNTER — Encounter: Payer: Self-pay | Admitting: Cardiovascular Disease

## 2013-03-31 ENCOUNTER — Ambulatory Visit (INDEPENDENT_AMBULATORY_CARE_PROVIDER_SITE_OTHER): Payer: Medicare Other | Admitting: Cardiovascular Disease

## 2013-03-31 VITALS — BP 112/70 | HR 70 | Ht 64.5 in | Wt 147.5 lb

## 2013-03-31 DIAGNOSIS — I5181 Takotsubo syndrome: Secondary | ICD-10-CM

## 2013-03-31 DIAGNOSIS — I10 Essential (primary) hypertension: Secondary | ICD-10-CM

## 2013-03-31 DIAGNOSIS — R609 Edema, unspecified: Secondary | ICD-10-CM

## 2013-03-31 NOTE — Patient Instructions (Addendum)
Continue same medications.   Follow up as needed.  

## 2013-03-31 NOTE — Assessment & Plan Note (Signed)
Blood pressure is well controlled on current medications. 

## 2013-03-31 NOTE — Progress Notes (Signed)
HPI  This is a 78 year old pleasant female who is here today for a followup visit. She presented in September of 2013 with chest discomfort suggestive of myocardial infarction. Her troponin peaked at 1.8 with a dynamic ST changes. She underwent cardiac catheterization which showed no significant coronary artery disease with evidence of stress-induced cardiomyopathy and ejection fraction of 35%. A true stressful event could not be determined from the patient.  She was discharged home on a small dose lisinopril which was subsequently changed to losartan due to cough. Small dose Toprol was added . She had a followup echocardiogram which showed that her ejection fraction was back to normal. She has been doing well and denies any chest pain, dyspnea or orthopnea. She has intermittent chronic lower extremity edema even before cardiac event and uses Lasix as needed.  Allergies  Allergen Reactions  . Amoxicillin Rash  . Codeine   . Naprosyn (Naproxen) Swelling  . Orudis (Ketoprofen)   . Sulfathiazole Rash     Current Outpatient Prescriptions on File Prior to Visit  Medication Sig Dispense Refill  . aspirin 81 MG tablet Take 81 mg by mouth daily.      . Calcium Carbonate-Vitamin D (CALCIUM + D) 600-200 MG-UNIT TABS Take 1 tablet by mouth daily.        . cholecalciferol (VITAMIN D) 1000 UNITS tablet Take 1,000 Units by mouth daily.        . furosemide (LASIX) 20 MG tablet Once daily as needed for fluid retention  30 tablet  3  . levothyroxine (SYNTHROID, LEVOTHROID) 88 MCG tablet Take 1 tablet (88 mcg total) by mouth daily.  90 tablet  3  . losartan (COZAAR) 25 MG tablet Take 1 tablet (25 mg total) by mouth daily.  90 tablet  3  . metoprolol succinate (TOPROL XL) 25 MG 24 hr tablet Take 1 tablet (25 mg total) by mouth daily.  90 tablet  3  . Multiple Vitamins-Minerals (OSTEO COMPLEX PO) Take one by mouth daily      . omeprazole (PRILOSEC) 20 MG capsule Take 1 capsule (20 mg total) by mouth  daily.  90 capsule  3  . polyethylene glycol (MIRALAX / GLYCOLAX) packet Take 17 g by mouth daily.      . vitamin B-12 (CYANOCOBALAMIN) 1000 MCG tablet Take 1,000 mcg by mouth daily.       No current facility-administered medications on file prior to visit.     Past Medical History  Diagnosis Date  . Cancer     Thyroid  . Menopause 40s    natural, hot flashes and mood lability now gone, off prempro 7 months  . Osteoporosis   . hypothyroidism     secondary to thyroidectomy for thyroid ca  . Hypertension   . Myocardial infarction 2013  . Stress-induced cardiomyopathy September of 2013    EF 35%. Peak troponin was 1.8.     Past Surgical History  Procedure Laterality Date  . Kyphosis surgery  Feb 2008    L1, Dr. Gerrit Heck  . Cholecystectomy    . Tonsillectomy    . Tubal ligation    . Lumbar disc surgery      L4-L5  . Shoulder arthroscopy  2004    Left, Dr. Gavin Potters  . Thyroidectomy      Thyroid Cancer  . Spine surgery      L4-5 diskectomy  . Cardiac catheterization  05/2012    [facility]. No significant CAD. Ejection fraction of 35% due to stress-induced  cardiomyopathy.     Family History  Problem Relation Age of Onset  . COPD Father   . Cancer Father     esophageal  . Kidney disease Mother   . Heart disease Mother   . Kidney disease Sister   . Cancer Brother 76    colon cancer (both brothers)     History   Social History  . Marital Status: Married    Spouse Name: N/A    Number of Children: N/A  . Years of Education: N/A   Occupational History  . Not on file.   Social History Main Topics  . Smoking status: Never Smoker   . Smokeless tobacco: Never Used  . Alcohol Use: No  . Drug Use: No  . Sexually Active: Not on file   Other Topics Concern  . Not on file   Social History Narrative  . No narrative on file       PHYSICAL EXAM   BP 112/70  Pulse 70  Ht 5' 4.5" (1.638 m)  Wt 147 lb 8 oz (66.906 kg)  BMI 24.94 kg/m2  Constitutional: She is  oriented to person, place, and time. She appears well-developed and well-nourished. No distress.  HENT: No nasal discharge.  Head: Normocephalic and atraumatic.  Eyes: Pupils are equal and round. Right eye exhibits no discharge. Left eye exhibits no discharge.  Neck: Normal range of motion. Neck supple. No JVD present. No thyromegaly present.  Cardiovascular: Normal rate, regular rhythm, normal heart sounds. Exam reveals no gallop and no friction rub. No murmur heard.  Pulmonary/Chest: Effort normal and breath sounds normal. No stridor. No respiratory distress. She has no wheezes. She has no rales. She exhibits no tenderness.  Abdominal: Soft. Bowel sounds are normal. She exhibits no distension. There is no tenderness. There is no rebound and no guarding.  Musculoskeletal: Normal range of motion. She exhibits no edema and no tenderness.  Neurological: She is alert and oriented to person, place, and time. Coordination normal.  Skin: Skin is warm and dry. No rash noted. She is not diaphoretic. No erythema. No pallor.  Psychiatric: She has a normal mood and affect. Her behavior is normal. Judgment and thought content normal.   OZH:YQMVH  Rhythm  -First degree A-V block  PRi = 228 -Prominent R(V1) -nonspecific.   Low voltage -possible pulmonary disease.   ABNORMAL   ASSESSMENT AND PLAN

## 2013-03-31 NOTE — Assessment & Plan Note (Signed)
This is likely related to chronic venous insufficiency and not related to previous cardiac event. I advised her to elevate her legs frequently during the day and use support stockings.

## 2013-03-31 NOTE — Assessment & Plan Note (Signed)
She is doing well from a cardiac standpoint with no recurrent cardiac symptoms. Ejection fraction normalized. She can followup with me as needed.

## 2013-05-09 ENCOUNTER — Emergency Department: Payer: Self-pay | Admitting: Emergency Medicine

## 2013-06-08 ENCOUNTER — Ambulatory Visit (INDEPENDENT_AMBULATORY_CARE_PROVIDER_SITE_OTHER): Payer: Medicare Other | Admitting: Internal Medicine

## 2013-06-08 ENCOUNTER — Encounter: Payer: Self-pay | Admitting: Internal Medicine

## 2013-06-08 VITALS — BP 132/72 | HR 74 | Temp 97.7°F | Resp 14 | Ht 64.5 in | Wt 149.5 lb

## 2013-06-08 DIAGNOSIS — E039 Hypothyroidism, unspecified: Secondary | ICD-10-CM

## 2013-06-08 DIAGNOSIS — E785 Hyperlipidemia, unspecified: Secondary | ICD-10-CM

## 2013-06-08 DIAGNOSIS — R51 Headache: Secondary | ICD-10-CM

## 2013-06-08 DIAGNOSIS — I5181 Takotsubo syndrome: Secondary | ICD-10-CM

## 2013-06-08 DIAGNOSIS — R609 Edema, unspecified: Secondary | ICD-10-CM

## 2013-06-08 DIAGNOSIS — E559 Vitamin D deficiency, unspecified: Secondary | ICD-10-CM

## 2013-06-08 DIAGNOSIS — I10 Essential (primary) hypertension: Secondary | ICD-10-CM

## 2013-06-08 DIAGNOSIS — R519 Headache, unspecified: Secondary | ICD-10-CM

## 2013-06-08 DIAGNOSIS — Z23 Encounter for immunization: Secondary | ICD-10-CM

## 2013-06-08 DIAGNOSIS — Z79899 Other long term (current) drug therapy: Secondary | ICD-10-CM

## 2013-06-08 MED ORDER — FUROSEMIDE 20 MG PO TABS: ORAL_TABLET | ORAL | Status: DC

## 2013-06-08 MED ORDER — LOSARTAN POTASSIUM 25 MG PO TABS: 25.0000 mg | ORAL_TABLET | Freq: Every day | ORAL | Status: DC

## 2013-06-08 MED ORDER — METOPROLOL SUCCINATE ER 25 MG PO TB24: 25.0000 mg | ORAL_TABLET | Freq: Every day | ORAL | Status: DC

## 2013-06-08 NOTE — Patient Instructions (Addendum)
You had fasting labs done today. We will notify you of the results in a few days.  Please continue the medications you have been prescribed.  The fluid retention in your legs will imporve with more exercise of your calf muscles, with elevation of legsm and use of compression stockings,  Especially during prolonged travel.  Please return in 6 months for your annual exam.

## 2013-06-08 NOTE — Progress Notes (Signed)
Patient ID: Kim Santiago, female   DOB: 09-20-1934, 78 y.o.   MRN: [id number]   Patient Active Problem List   Diagnosis Date Noted  . Edema 12/28/2012  . Urge incontinence 12/28/2012  . Other and unspecified hyperlipidemia 12/28/2012  . Routine general medical examination at a health care facility 12/28/2012  . Nasal sinus congestion 10/10/2012  . Stress-induced cardiomyopathy   . Hypertension 10/02/2011  . Unspecified hypothyroidism 10/02/2011  . Screening for osteoporosis     Subjective:  CC:   Chief Complaint  Patient presents with  . Follow-up    medication questions    HPI:   Kim Santiago a 78 y.o. female who presents Follow up on multiple medical issues including stress induced cardiomyopathy, presenting with chest pain October  2013.  She was released by Dr. Kirke Corin in July, after repeat ECHO reflected normalization of EF from 35% previously noted on angiography.  She was prescribed losartan  and toprol and is toleating both medications but wondering if she needs them.   2) Lower extremity edema..  She is not wearing stockings,  Bu t using lasix prn    3) Skin lesions.  She had several atypical lesions taken off by dermatoloigst Dr. Anthonette Legato. Going back for additional biopsies 2 weeks.   4) Had a fall while walking on an uneven sidewalk 2 to 3 weeks ago.  She fell forward,  Hit her forehead on pavement. And suffered blunt facial trauma with abrasions.   No concussion,  Treated by Mayford Knife in ER for headaches and contusions.  No head CT was done,  And patient continues to report frequent occipital headaches,   Feels like it is coming from her neck     Past Medical History  Diagnosis Date  . Cancer     Thyroid  . Menopause 40s    natural, hot flashes and mood lability now gone, off prempro 7 months  . Osteoporosis   . hypothyroidism     secondary to thyroidectomy for thyroid ca  . Hypertension   . Myocardial infarction 2013  . Stress-induced  cardiomyopathy September of 2013    EF 35%. Peak troponin was 1.8.    Past Surgical History  Procedure Laterality Date  . Kyphosis surgery  Feb 2008    L1, Dr. Gerrit Heck  . Cholecystectomy    . Tonsillectomy    . Tubal ligation    . Lumbar disc surgery      L4-L5  . Shoulder arthroscopy  2004    Left, Dr. Gavin Potters  . Thyroidectomy      Thyroid Cancer  . Spine surgery      L4-5 diskectomy  . Cardiac catheterization  05/2012    [facility]. No significant CAD. Ejection fraction of 35% due to stress-induced cardiomyopathy.       The following portions of the patient's history were reviewed and updated as appropriate: Allergies, current medications, and problem list.    Review of Systems:   12 Pt  review of systems was negative except those addressed in the HPI,     History   Social History  . Marital Status: Married    Spouse Name: N/A    Number of Children: N/A  . Years of Education: N/A   Occupational History  . Not on file.   Social History Main Topics  . Smoking status: Never Smoker   . Smokeless tobacco: Never Used  . Alcohol Use: No  . Drug Use: No  . Sexual Activity: Not  on file   Other Topics Concern  . Not on file   Social History Narrative  . No narrative on file    Objective:  Filed Vitals:   06/08/13 0927  BP: 132/72  Pulse: 74  Temp: 97.7 F (36.5 C)  Resp: 14     General appearance: alert, cooperative and appears stated age Ears: normal TM's and external ear canals both ears Throat: lips, mucosa, and tongue normal; teeth and gums normal Neck: no adenopathy, no carotid bruit, supple, symmetrical, trachea midline and thyroid not enlarged, symmetric, no tenderness/mass/nodules Back: symmetric, no curvature. ROM normal. No CVA tenderness. Lungs: clear to auscultation bilaterally Heart: regular rate and rhythm, S1, S2 normal, no murmur, click, rub or gallop Abdomen: soft, non-tender; bowel sounds normal; no masses,  no organomegaly Pulses:  2+ and symmetric Skin: Skin color, texture, turgor normal. No rashes or lesions Lymph nodes: Cervical, supraclavicular, and axillary nodes normal.  Assessment and Plan:  Stress-induced cardiomyopathy Recommended that she contineu losartan and Toprol for now.  EF normal and BP tolerating both meds.   Edema Secondary to venous insufficiency.  cautioned to avoid overuse of furosemide .  Proper management of venous insufficiency discussed  Other and unspecified hyperlipidemia Given her normal coronaries on catheterization October 2013,  Will not treat LDl of 110  Hypertension Well controlled on current regimen. Renal function stable, no changes today.  Headache, occipital New onset,  nealry daily since her fail several weeks ago.  Headaches are improving so SDH unlikely.  Source of headaches is likely cervical spondylosis.  Dicussed workup if headaches do not resolve   Updated Medication List Outpatient Encounter Prescriptions as of 06/08/2013  Medication Sig Dispense Refill  . acetaminophen (TYLENOL) 500 MG tablet Take 500 mg by mouth every 6 (six) hours as needed for pain.      Marland Kitchen aspirin 81 MG tablet Take 81 mg by mouth daily.      . Calcium Carbonate-Vitamin D (CALCIUM + D) 600-200 MG-UNIT TABS Take 1 tablet by mouth daily.        . cholecalciferol (VITAMIN D) 1000 UNITS tablet Take 1,000 Units by mouth daily.        . fish oil-omega-3 fatty acids 1000 MG capsule Take 2 g by mouth daily.      . furosemide (LASIX) 20 MG tablet Once daily as needed for fluid retention  90 tablet  3  . levothyroxine (SYNTHROID, LEVOTHROID) 88 MCG tablet Take 1 tablet (88 mcg total) by mouth daily.  90 tablet  3  . losartan (COZAAR) 25 MG tablet Take 1 tablet (25 mg total) by mouth daily.  90 tablet  3  . metoprolol succinate (TOPROL XL) 25 MG 24 hr tablet Take 1 tablet (25 mg total) by mouth daily.  90 tablet  3  . Multiple Vitamins-Minerals (OSTEO COMPLEX PO) Take one by mouth daily      . omeprazole  (PRILOSEC) 20 MG capsule Take 1 capsule (20 mg total) by mouth daily.  90 capsule  3  . polyethylene glycol (MIRALAX / GLYCOLAX) packet Take 17 g by mouth daily.      . vitamin B-12 (CYANOCOBALAMIN) 1000 MCG tablet Take 1,000 mcg by mouth daily.      . [DISCONTINUED] furosemide (LASIX) 20 MG tablet Once daily as needed for fluid retention  30 tablet  3  . [DISCONTINUED] losartan (COZAAR) 25 MG tablet Take 1 tablet (25 mg total) by mouth daily.  90 tablet  3  . [DISCONTINUED] metoprolol  succinate (TOPROL XL) 25 MG 24 hr tablet Take 1 tablet (25 mg total) by mouth daily.  90 tablet  3   No facility-administered encounter medications on file as of 06/08/2013.

## 2013-06-09 LAB — COMPREHENSIVE METABOLIC PANEL
ALT: 6 U/L (ref 0–35)
AST: 17 U/L (ref 0–37)
Albumin: 3.9 g/dL (ref 3.5–5.2)
Alkaline Phosphatase: 75 U/L (ref 39–117)
BUN: 13 mg/dL (ref 6–23)
Chloride: 107 mEq/L (ref 96–112)
Potassium: 4.4 mEq/L (ref 3.5–5.1)
Sodium: 141 mEq/L (ref 135–145)
Total Bilirubin: 0.6 mg/dL (ref 0.3–1.2)
Total Protein: 6.7 g/dL (ref 6.0–8.3)

## 2013-06-09 LAB — LIPID PANEL
HDL: 40 mg/dL (ref >39.00)
LDL Cholesterol: 110 mg/dL — ABNORMAL HIGH (ref 0–99)
Triglycerides: 95 mg/dL (ref 0.0–149.0)
VLDL: 19 mg/dL (ref 0.0–40.0)

## 2013-06-09 LAB — TSH: TSH: 1.06 u[IU]/mL (ref 0.35–5.50)

## 2013-06-10 ENCOUNTER — Encounter: Payer: Self-pay | Admitting: Internal Medicine

## 2013-06-10 DIAGNOSIS — R519 Headache, unspecified: Secondary | ICD-10-CM | POA: Insufficient documentation

## 2013-06-10 NOTE — Assessment & Plan Note (Signed)
Well controlled on current regimen. Renal function stable, no changes today. 

## 2013-06-10 NOTE — Assessment & Plan Note (Signed)
Recommended that she contineu losartan and Toprol for now.  EF normal and BP tolerating both meds.

## 2013-06-10 NOTE — Assessment & Plan Note (Signed)
Secondary to venous insufficiency.  cautioned to avoid overuse of furosemide .  Proper management of venous insufficiency discussed

## 2013-06-10 NOTE — Assessment & Plan Note (Signed)
New onset,  nealry daily since her fail several weeks ago.  Headaches are improving so SDH unlikely.  Source of headaches is likely cervical spondylosis.  Dicussed workup if headaches do not resolve

## 2013-06-10 NOTE — Assessment & Plan Note (Signed)
Given her normal coronaries on catheterization October 2013,  Will not treat LDl of 110   

## 2013-06-11 ENCOUNTER — Encounter: Payer: Self-pay | Admitting: Internal Medicine

## 2013-07-25 ENCOUNTER — Ambulatory Visit: Payer: Self-pay | Admitting: Gastroenterology

## 2013-07-26 LAB — PATHOLOGY REPORT

## 2013-08-04 LAB — HM COLONOSCOPY

## 2013-08-21 ENCOUNTER — Encounter: Payer: Self-pay | Admitting: Internal Medicine

## 2013-09-11 ENCOUNTER — Encounter: Payer: Self-pay | Admitting: Internal Medicine

## 2013-09-22 ENCOUNTER — Ambulatory Visit (INDEPENDENT_AMBULATORY_CARE_PROVIDER_SITE_OTHER)
Admission: RE | Admit: 2013-09-22 | Discharge: 2013-09-22 | Disposition: A | Discharge status: Home / Self Care | Payer: Medicare HMO | Source: Ambulatory Visit | Attending: Internal Medicine | Admitting: Internal Medicine

## 2013-09-22 ENCOUNTER — Ambulatory Visit (INDEPENDENT_AMBULATORY_CARE_PROVIDER_SITE_OTHER): Payer: Medicare HMO | Admitting: Internal Medicine

## 2013-09-22 ENCOUNTER — Encounter: Payer: Self-pay | Admitting: Internal Medicine

## 2013-09-22 VITALS — BP 134/68 | HR 99 | Temp 97.9°F | Resp 16 | Wt 147.2 lb

## 2013-09-22 DIAGNOSIS — M542 Cervicalgia: Secondary | ICD-10-CM

## 2013-09-22 DIAGNOSIS — L570 Actinic keratosis: Secondary | ICD-10-CM | POA: Insufficient documentation

## 2013-09-22 DIAGNOSIS — L819 Disorder of pigmentation, unspecified: Secondary | ICD-10-CM

## 2013-09-22 DIAGNOSIS — R51 Headache: Secondary | ICD-10-CM

## 2013-09-22 DIAGNOSIS — H5713 Ocular pain, bilateral: Secondary | ICD-10-CM | POA: Insufficient documentation

## 2013-09-22 DIAGNOSIS — R519 Headache, unspecified: Secondary | ICD-10-CM

## 2013-09-22 DIAGNOSIS — H5710 Ocular pain, unspecified eye: Secondary | ICD-10-CM

## 2013-09-22 NOTE — Progress Notes (Signed)
Patient ID: Kim Santiago, female   DOB: 1935/07/09, 80 y.o.   MRN: [id number]  Patient Active Problem List   Diagnosis Date Noted  . Atypical pigmented skin lesion 09/22/2013  . Cervicalgia of occipito-atlanto-axial region 09/22/2013  . Pain of both eyes 09/22/2013  . Headache, occipital 06/10/2013  . Edema 12/28/2012  . Other and unspecified hyperlipidemia 12/28/2012  . Routine general medical examination at a health care facility 12/28/2012  . Stress-induced cardiomyopathy   . Unspecified hypothyroidism 10/02/2011  . Screening for osteoporosis     Subjective:  CC:   Chief Complaint  Patient presents with  . Fall    X 3 months ago and is having a pain in head with movement. and eyes were blurry for 3 days.    HPI:   Kim Santiago a 79 y.o. female who presents for evaluation of 2 new issues.    1) She has been having left sided scalp pain.  She had a fall several months ago and eyeglasses were broken. Was treated in ER  without x ray s or head CT . She has been having episodic pain on left side of head that starts behind the ear and radiates to temple,  brought on by turning her head quickly. head turning .  2) Has been having eye pain , bilateral , sometimes unilateral, alternating   Previously attributed to dry eye but has not improved. Despite  using Systane and Refresh eye lubricants.  Has not tried Restasis due to cost. Most recent episode lasted 3 days and was accompanied by blurry vision  Which resolves when the headaches resolve.   Last eye exam and new rx for glasses  4 months ago by Toribio Harbour in [place] (optometrist)     Past Medical History  Diagnosis Date  . Cancer     Thyroid  . Menopause 40s    natural, hot flashes and mood lability now gone, off prempro 7 months  . Osteoporosis   . hypothyroidism     secondary to thyroidectomy for thyroid ca  . Hypertension   . Myocardial infarction 2013  . Stress-induced cardiomyopathy September of 2013   EF 35%. Peak troponin was 1.8.    Past Surgical History  Procedure Laterality Date  . Kyphosis surgery  Feb 2008    L1, Dr. Mauri Pole  . Cholecystectomy    . Tonsillectomy    . Tubal ligation    . Lumbar disc surgery      L4-L5  . Shoulder arthroscopy  2004    Left, Dr. Jefm Bryant  . Thyroidectomy      Thyroid Cancer  . Spine surgery      L4-5 diskectomy  . Cardiac catheterization  05/2012    [facility]. No significant CAD. Ejection fraction of 35% due to stress-induced cardiomyopathy.       The following portions of the patient's history were reviewed and updated as appropriate: Allergies, current medications, and problem list.    Review of Systems:  Patient denies headache, fevers, malaise, unintentional weight loss, skin rash, eye pain, sinus congestion and sinus pain, sore throat, dysphagia,  hemoptysis , cough, dyspnea, wheezing, chest pain, palpitations, orthopnea, edema, abdominal pain, nausea, melena, diarrhea, constipation, flank pain, dysuria, hematuria, urinary  Frequency, nocturia, numbness, tingling, seizures,  Focal weakness, Loss of consciousness,  Tremor, insomnia, depression, anxiety, and suicidal ideation.       History   Social History  . Marital Status: Married    Spouse Name: N/A  Number of Children: N/A  . Years of Education: N/A   Occupational History  . Not on file.   Social History Main Topics  . Smoking status: Never Smoker   . Smokeless tobacco: Never Used  . Alcohol Use: No  . Drug Use: No  . Sexual Activity: Not on file   Other Topics Concern  . Not on file   Social History Narrative  . No narrative on file    Objective:  Filed Vitals:   09/22/13 1015  BP: 134/68  Pulse: 99  Temp: 97.9 F (36.6 C)  Resp: 16     General appearance: alert, cooperative and appears stated age Ears: normal TM's and external ear canals both ears Throat: lips, mucosa, and tongue normal; teeth and gums normal Neck: no adenopathy, no carotid bruit,  supple, symmetrical, trachea midline and thyroid not enlarged, symmetric, no tenderness/mass/nodules Back: symmetric, no curvature. ROM normal. No CVA tenderness. Lungs: clear to auscultation bilaterally Heart: regular rate and rhythm, S1, S2 normal, no murmur, click, rub or gallop Abdomen: soft, non-tender; bowel sounds normal; no masses,  no organomegaly Pulses: 2+ and symmetric Skin: Skin color, texture, turgor normal. No rashes or lesions Lymph nodes: Cervical, supraclavicular, and axillary nodes normal.  Assessment and Plan:  Atypical pigmented skin lesion Has had several lesions and due to change in insureance to [facility],  Needs new referral to dermatologist. Feb 9,2015 next appt with Dr Phillip Heal.   Pain of both eyes Recurrent ,   Episodic, accompanied by blurred vision. Refer to [facility] to rule out glaucoma., episcleritis   Cervicalgia of occipito-atlanto-axial region Since her fall 3 months ago.  c3 distribution,.   Plain films ordered did not show any degenerative changes that could be causing nerve root irritation   Headache, occipital Current headache is now unilateral and involving the C3 distribution. Anus brought on by sudden position change with head turning and started after having a fall. I suspect that she has bone spur formation in the cervical spine which is causing episodic foraminal nerve root irritation. Plain films were not notable for alignment or degenerative issues.  If pain continues she will need a cervical MRI   Updated Medication List Outpatient Encounter Prescriptions as of 09/22/2013  Medication Sig  . acetaminophen (TYLENOL) 500 MG tablet Take 500 mg by mouth every 6 (six) hours as needed for pain.  Marland Kitchen aspirin 81 MG tablet Take 81 mg by mouth daily.  . Calcium Carbonate-Vitamin D (CALCIUM + D) 600-200 MG-UNIT TABS Take 1 tablet by mouth daily.    . cholecalciferol (VITAMIN D) 1000 UNITS tablet Take 1,000 Units by mouth daily.    . fish oil-omega-3  fatty acids 1000 MG capsule Take 2 g by mouth daily.  . furosemide (LASIX) 20 MG tablet Once daily as needed for fluid retention  . levothyroxine (SYNTHROID, LEVOTHROID) 88 MCG tablet Take 1 tablet (88 mcg total) by mouth daily.  Marland Kitchen losartan (COZAAR) 25 MG tablet Take 1 tablet (25 mg total) by mouth daily.  . metoprolol succinate (TOPROL XL) 25 MG 24 hr tablet Take 1 tablet (25 mg total) by mouth daily.  Marland Kitchen omeprazole (PRILOSEC) 20 MG capsule Take 1 capsule (20 mg total) by mouth daily.  . polyethylene glycol (MIRALAX / GLYCOLAX) packet Take 17 g by mouth daily.  . vitamin B-12 (CYANOCOBALAMIN) 1000 MCG tablet Take 1,000 mcg by mouth daily.  . Multiple Vitamins-Minerals (OSTEO COMPLEX PO) Take one by mouth daily     Orders Placed This  Encounter  Procedures  . DG Cervical Spine Complete  . Ambulatory referral to Dermatology  . Ambulatory referral to Ophthalmology    No Follow-up on file.

## 2013-09-22 NOTE — Assessment & Plan Note (Signed)
Since her fall 3 months ago.  c3 distribution,.   Plain films ordered.

## 2013-09-22 NOTE — Patient Instructions (Signed)
Referral to [place] to rule out glaucoma and other eye conditions   Referral for ongoing dermatology with Hinda Kehr  Plain films of cervical spine to be done at [facility] to evaluation  Vertebral alignment

## 2013-09-22 NOTE — Assessment & Plan Note (Signed)
Has had several lesions  Feb 9,2015 next appt with Dr Phillip Heal.

## 2013-09-22 NOTE — Assessment & Plan Note (Addendum)
Recurrent ,   Episodic, accompanied by blurred vision. Refer to [facility] to rule out glaucoma., episcleritis

## 2013-09-24 ENCOUNTER — Encounter: Payer: Self-pay | Admitting: Internal Medicine

## 2013-09-24 NOTE — Assessment & Plan Note (Signed)
Current headache is now unilateral and involving the C3 distribution. Anus brought on by sudden position change with head turning and started after having a fall. I suspect that she has bone spur formation in the cervical spine which is causing episodic foraminal nerve root irritation. Plain films followed by a probable need for cervical MRI

## 2013-10-04 ENCOUNTER — Telehealth: Payer: Self-pay | Admitting: Emergency Medicine

## 2013-10-04 NOTE — Telephone Encounter (Signed)
[employer] has approved 4 visits to the [facility] exp 03/26/14. auth # [id number]

## 2013-10-05 ENCOUNTER — Telehealth: Payer: Self-pay | Admitting: Internal Medicine

## 2013-10-05 NOTE — Telephone Encounter (Signed)
The patient is calling for her X-ray results from 1.16.15.

## 2013-10-05 NOTE — Telephone Encounter (Signed)
[employer] has approved pt to see Dr. Phillip Heal, Dermatology with 4 visits exp 01/13/14. Auth # [id number]

## 2013-10-06 NOTE — Telephone Encounter (Signed)
Please advise 

## 2013-11-04 LAB — HM COLONOSCOPY: HM Colonoscopy: NORMAL

## 2013-11-14 ENCOUNTER — Telehealth: Payer: Self-pay | Admitting: Internal Medicine

## 2013-11-14 DIAGNOSIS — E039 Hypothyroidism, unspecified: Secondary | ICD-10-CM

## 2013-11-14 MED ORDER — OMEPRAZOLE 20 MG PO CPDR: 20.0000 mg | DELAYED_RELEASE_CAPSULE | Freq: Every day | ORAL | Status: DC

## 2013-11-14 MED ORDER — METOPROLOL SUCCINATE ER 25 MG PO TB24: 25.0000 mg | ORAL_TABLET | Freq: Every day | ORAL | Status: DC

## 2013-11-14 MED ORDER — LOSARTAN POTASSIUM 25 MG PO TABS: 25.0000 mg | ORAL_TABLET | Freq: Every day | ORAL | Status: DC

## 2013-11-14 MED ORDER — LEVOTHYROXINE SODIUM 88 MCG PO TABS: 88.0000 ug | ORAL_TABLET | Freq: Every day | ORAL | Status: DC

## 2013-11-14 NOTE — Telephone Encounter (Signed)
metoprolol succinate (TOPROL XL) 25 MG 24 hr tablet  losartan (COZAAR) 25 MG tablet  omeprazole (PRILOSEC) 20 MG capsule  levothyroxine (SYNTHROID, LEVOTHROID) 88 MCG tablet

## 2013-11-14 NOTE — Telephone Encounter (Signed)
Medication sent.

## 2014-03-06 ENCOUNTER — Encounter: Payer: Self-pay | Admitting: Internal Medicine

## 2014-03-06 ENCOUNTER — Ambulatory Visit (INDEPENDENT_AMBULATORY_CARE_PROVIDER_SITE_OTHER): Payer: Medicare HMO | Admitting: Internal Medicine

## 2014-03-06 VITALS — BP 128/66 | HR 83 | Temp 97.9°F | Resp 16 | Ht 64.25 in | Wt 147.5 lb

## 2014-03-06 DIAGNOSIS — Z1239 Encounter for other screening for malignant neoplasm of breast: Secondary | ICD-10-CM

## 2014-03-06 DIAGNOSIS — F5102 Adjustment insomnia: Secondary | ICD-10-CM

## 2014-03-06 DIAGNOSIS — L819 Disorder of pigmentation, unspecified: Secondary | ICD-10-CM

## 2014-03-06 DIAGNOSIS — K117 Disturbances of salivary secretion: Secondary | ICD-10-CM

## 2014-03-06 DIAGNOSIS — E039 Hypothyroidism, unspecified: Secondary | ICD-10-CM

## 2014-03-06 DIAGNOSIS — M3500 Sicca syndrome, unspecified: Secondary | ICD-10-CM

## 2014-03-06 DIAGNOSIS — H04123 Dry eye syndrome of bilateral lacrimal glands: Secondary | ICD-10-CM

## 2014-03-06 DIAGNOSIS — R682 Dry mouth, unspecified: Secondary | ICD-10-CM

## 2014-03-06 DIAGNOSIS — R5383 Other fatigue: Secondary | ICD-10-CM

## 2014-03-06 DIAGNOSIS — Z23 Encounter for immunization: Secondary | ICD-10-CM

## 2014-03-06 DIAGNOSIS — R609 Edema, unspecified: Secondary | ICD-10-CM

## 2014-03-06 DIAGNOSIS — Z0000 Encounter for general adult medical examination without abnormal findings: Secondary | ICD-10-CM

## 2014-03-06 DIAGNOSIS — R5381 Other malaise: Secondary | ICD-10-CM

## 2014-03-06 DIAGNOSIS — H04129 Dry eye syndrome of unspecified lacrimal gland: Secondary | ICD-10-CM

## 2014-03-06 LAB — CBC WITH DIFFERENTIAL/PLATELET
BASOS ABS: 0 10*3/uL (ref 0.0–0.1)
Basophils Relative: 0.3 % (ref 0.0–3.0)
EOS ABS: 0.1 10*3/uL (ref 0.0–0.7)
Eosinophils Relative: 2.2 % (ref 0.0–5.0)
HEMATOCRIT: 36.6 % (ref 36.0–46.0)
Hemoglobin: 12.3 g/dL (ref 12.0–15.0)
LYMPHS ABS: 1.5 10*3/uL (ref 0.7–4.0)
Lymphocytes Relative: 26.8 % (ref 12.0–46.0)
MCHC: 33.5 g/dL (ref 30.0–36.0)
MCV: 91.8 fl (ref 78.0–100.0)
Monocytes Absolute: 0.4 10*3/uL (ref 0.1–1.0)
Monocytes Relative: 7.8 % (ref 3.0–12.0)
Neutro Abs: 3.6 10*3/uL (ref 1.4–7.7)
Neutrophils Relative %: 62.9 % (ref 43.0–77.0)
PLATELETS: 211 10*3/uL (ref 150.0–400.0)
RBC: 3.99 Mil/uL (ref 3.87–5.11)
RDW: 14 % (ref 11.5–15.5)
WBC: 5.8 10*3/uL (ref 4.0–10.5)

## 2014-03-06 LAB — COMPREHENSIVE METABOLIC PANEL
ALBUMIN: 4 g/dL (ref 3.5–5.2)
ALT: 5 U/L (ref 0–35)
AST: 20 U/L (ref 0–37)
Alkaline Phosphatase: 72 U/L (ref 39–117)
BUN: 12 mg/dL (ref 6–23)
CO2: 30 mEq/L (ref 19–32)
Calcium: 9.3 mg/dL (ref 8.4–10.5)
Chloride: 97 mEq/L (ref 96–112)
Creatinine, Ser: 0.7 mg/dL (ref 0.4–1.2)
GFR: 87.65 mL/min (ref >60.00)
Glucose, Bld: 105 mg/dL — ABNORMAL HIGH (ref 70–99)
POTASSIUM: 4.2 meq/L (ref 3.5–5.1)
SODIUM: 135 meq/L (ref 135–145)
TOTAL PROTEIN: 7.5 g/dL (ref 6.0–8.3)
Total Bilirubin: 0.4 mg/dL (ref 0.2–1.2)

## 2014-03-06 LAB — TSH: TSH: 0.22 u[IU]/mL — ABNORMAL LOW (ref 0.35–4.50)

## 2014-03-06 MED ORDER — LEVOTHYROXINE SODIUM 75 MCG PO TABS: 75.0000 ug | ORAL_TABLET | Freq: Every day | ORAL | Status: DC

## 2014-03-06 MED ORDER — FUROSEMIDE 20 MG PO TABS: ORAL_TABLET | ORAL | Status: DC

## 2014-03-06 MED ORDER — ALPRAZOLAM 0.25 MG PO TABS: 0.2500 mg | ORAL_TABLET | Freq: Every evening | ORAL | Status: DC | PRN

## 2014-03-06 NOTE — Patient Instructions (Addendum)
You had your annual Medicare wellness exam today  We will schedule your 3 D mammogram soon at either [place] or [place] .  You need to have a TDaP vaccine and a Shingles vaccine.  I have given you prescriptions for thses because they will be cheaper at the health Dept or at your  local pharmacy because Medicare will not reimburse for them.  You received the pneumonia vaccine today.  We will contact you with the bloodwork results  Your dry mouth may be from the tylenol PM you are taking for insomnia.  Please suspend it and try the new medication  I am prescribing as an alternative.. The medication for insomnia is a controlled substance called alprazolam

## 2014-03-06 NOTE — Assessment & Plan Note (Signed)
Annual comprehensive exam was done including breast, excluding pelvicexam. All screenings have been addressed .

## 2014-03-06 NOTE — Progress Notes (Signed)
Pre-visit discussion using our clinic review tool. No additional management support is needed unless otherwise documented below in the visit note.  

## 2014-03-06 NOTE — Assessment & Plan Note (Signed)
Thyroid function is overactive on current dose.  I have sent a lower dose of levothyroxine to her pharmacy and would like patient to change immediatley.  
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
## 2014-03-06 NOTE — Assessment & Plan Note (Signed)
The risks and benefits of benzodiazepine use were discussed with patient today including excessive sedation leading to respiratory depression,  impaired thinking/driving, and addiction.  Patient was advised to avoid concurrent use with alcohol, to use medication only as needed and not to share with others  .  

## 2014-03-06 NOTE — Assessment & Plan Note (Signed)
Using prn furosemide.

## 2014-03-06 NOTE — Assessment & Plan Note (Signed)
She is being treated for actinic keratoses by Dr Phillip Heal.

## 2014-03-07 LAB — ANA W/REFLEX IF POSITIVE
ANA: POSITIVE — AB
Centromere Ab Screen: <0.2 AI (ref 0.0–0.9)
ENA RNP Ab: <0.2 AI (ref 0.0–0.9)
ENA SM Ab Ser-aCnc: <0.2 AI (ref 0.0–0.9)
ENA SSB (LA) Ab: 1 AI — ABNORMAL HIGH (ref 0.0–0.9)
Scleroderma SCL-70: <0.2 AI (ref 0.0–0.9)
dsDNA Ab: <1 IU/mL (ref 0–9)

## 2014-03-08 NOTE — Telephone Encounter (Signed)
Left message for pt to return my call. Also, mailed unread message since office will be closed next 3 days.

## 2014-03-09 ENCOUNTER — Encounter: Payer: Self-pay | Admitting: Internal Medicine

## 2014-03-09 DIAGNOSIS — M3500 Sicca syndrome, unspecified: Secondary | ICD-10-CM | POA: Insufficient documentation

## 2014-03-09 NOTE — Addendum Note (Signed)
Addended by: [name] on: 03/09/2014 10:29 AM   Modules accepted: Orders

## 2014-03-09 NOTE — Assessment & Plan Note (Signed)
Suggested by histoyr of fry eyes /dry mouth and positive ANA with ENA titre > 8.0 Referral to rheumatology adcised.

## 2014-03-13 NOTE — Telephone Encounter (Signed)
Referral is in process as requested to Dr. Jefm Bryant

## 2014-03-14 NOTE — Telephone Encounter (Signed)
Patient would like to go with referral to Dr. Jefm Bryant

## 2014-04-02 ENCOUNTER — Emergency Department: Payer: Self-pay | Admitting: Emergency Medicine

## 2014-04-04 ENCOUNTER — Telehealth: Payer: Self-pay | Admitting: Internal Medicine

## 2014-04-04 NOTE — Telephone Encounter (Signed)
Patient will be here. 

## 2014-04-04 NOTE — Telephone Encounter (Signed)
Give her 11:30 tomorrow

## 2014-04-04 NOTE — Telephone Encounter (Signed)
Patient fell on the stairs hit head and knees, patient stated that ER MD said CT was normal of cephalic.  Patient stated she really needs to see you that she recently lost her husband and things just seem to have went down hill since. Patient also stated she needs to discuss medications with you. No 30 minute spots available unless we use the hold on 04/23/14 at 10 AM please advise.

## 2014-04-04 NOTE — Telephone Encounter (Signed)
Needing a ER follow up visit scheduled. I don't have any available appointments

## 2014-04-05 ENCOUNTER — Encounter: Payer: Self-pay | Admitting: Internal Medicine

## 2014-04-05 ENCOUNTER — Ambulatory Visit (INDEPENDENT_AMBULATORY_CARE_PROVIDER_SITE_OTHER): Payer: Medicare HMO | Admitting: Internal Medicine

## 2014-04-05 VITALS — BP 124/70 | HR 93 | Temp 98.1°F | Resp 16 | Ht 64.25 in | Wt 143.8 lb

## 2014-04-05 DIAGNOSIS — Z9181 History of falling: Secondary | ICD-10-CM

## 2014-04-05 DIAGNOSIS — M542 Cervicalgia: Secondary | ICD-10-CM

## 2014-04-05 DIAGNOSIS — F4322 Adjustment disorder with anxiety: Secondary | ICD-10-CM

## 2014-04-05 DIAGNOSIS — F4321 Adjustment disorder with depressed mood: Secondary | ICD-10-CM

## 2014-04-05 MED ORDER — ALPRAZOLAM 0.25 MG PO TABS: 0.2500 mg | ORAL_TABLET | Freq: Two times a day (BID) | ORAL | Status: DC | PRN

## 2014-04-05 NOTE — Patient Instructions (Signed)
I am so sorry for your unexpected loss.  I know you are still probably in shock about losing your husband  You can use the alprazolam as needed to help you rest over the next few week.  I will send a refill to your pharmacy  Regarding your facial contusion and your knee contusions  You may ice them for 15 minutes up to 4 times daily  You can use the ibuprofen 800 mg twice daily and tylenol 500 mg 4 times daily

## 2014-04-05 NOTE — Progress Notes (Signed)
Patient ID: Kim Santiago, female   DOB: [birth year], 79 y.o.   MRN: [id number]    . Patient Active Problem List   Diagnosis Date Noted  . History of fall within past 90 days 04/08/2014  . Grief 04/08/2014  . Sjogren's syndrome 03/09/2014  . Insomnia due to stress 03/06/2014  . Atypical pigmented skin lesion 09/22/2013  . Cervicalgia of occipito-atlanto-axial region 09/22/2013  . Edema 12/28/2012  . Other and unspecified hyperlipidemia 12/28/2012  . Routine general medical examination at a health care facility 12/28/2012  . Stress-induced cardiomyopathy   . Unspecified hypothyroidism 10/02/2011  . Screening for osteoporosis     Subjective:  CC:   Chief Complaint  Patient presents with  . Acute Visit    follow up for fall    HPI:   Kim Santiago is a 79 y.o. female who presents for Recent fall with blunt injury to right cheek  And both knees.. The fall occurred at her daughter's home where she has been staying after the unexpected death of her husband.  She fell down one unlighted step and struck her face on the cement floor .  Er visit July 28th  No fractures to either patella or facial bones but Ct showed right sided facial hematoma.  She denies LOC headache, visual changes, changes in mental status and pain with chewing.   Grief:  She is having a difficult time handling the unexpected loss of her husband but has emotional support from her grown daughter,  Close family friends, and her church.  Having some trouble sleeping      Past Medical History  Diagnosis Date  . Cancer     Thyroid  . Menopause 40s    natural, hot flashes and mood lability now gone, off prempro 7 months  . Osteoporosis   . hypothyroidism     secondary to thyroidectomy for thyroid ca  . Hypertension   . Myocardial infarction 2013  . Stress-induced cardiomyopathy September of 2013    EF 35%. Peak troponin was 1.8.    Past Surgical History  Procedure Laterality Date  . Kyphosis surgery  Feb  2008    L1, Dr. Mauri Pole  . Cholecystectomy    . Tonsillectomy    . Tubal ligation    . Lumbar disc surgery      L4-L5  . Shoulder arthroscopy  2004    Left, Dr. Jefm Bryant  . Thyroidectomy      Thyroid Cancer  . Spine surgery      L4-5 diskectomy  . Cardiac catheterization  05/2012    [facility]. No significant CAD. Ejection fraction of 35% due to stress-induced cardiomyopathy.       The following portions of the patient's history were reviewed and updated as appropriate: Allergies, current medications, and problem list.    Review of Systems:   Patient denies headache, fevers, malaise, unintentional weight loss, skin rash, eye pain, sinus congestion and sinus pain, sore throat, dysphagia,  hemoptysis , cough, dyspnea, wheezing, chest pain, palpitations, orthopnea, edema, abdominal pain, nausea, melena, diarrhea, constipation, flank pain, dysuria, hematuria, urinary  Frequency, nocturia, numbness, tingling, seizures,  Focal weakness, Loss of consciousness,  Tremor, insomnia, depression, anxiety, and suicidal ideation.     History   Social History  . Marital Status: Married    Spouse Name: N/A    Number of Children: N/A  . Years of Education: N/A   Occupational History  . Not on file.   Social History Main Topics  .  Smoking status: Never Smoker   . Smokeless tobacco: Never Used  . Alcohol Use: No  . Drug Use: No  . Sexual Activity: Not on file   Other Topics Concern  . Not on file   Social History Narrative  . No narrative on file    Objective:  Filed Vitals:   04/05/14 1126  BP: 124/70  Pulse: 93  Temp: 98.1 F (36.7 C)  Resp: 16     General appearance: alert, cooperative and appears stated age Face: right sided facial welling and accymosis.,  Ears: normal TM's and external ear canals both ears Neck: no adenopathy, no carotid bruit, supple, symmetrical, trachea midline and thyroid not enlarged, symmetric, no tenderness/mass/nodules Back: symmetric, no  curvature. ROM normal. No CVA tenderness. Lungs: clear to auscultation bilaterally Heart: regular rate and rhythm, S1, S2 normal, no murmur, click, rub or gallop Abdomen: soft, non-tender; bowel sounds normal; no masses,  no organomegaly Pulses: 2+ and symmetric Skin: Skin color, texture, turgor normal. No rashes or lesions Neuro: CNs 2-12 intact. DTRs 2+/4 in biceps, brachioradialis, patellars and achilles. Muscle strength 5/5 in upper and lower exremities. Fine resting tremor bilaterally both hands cerebellar function normal. Romberg negative.  No pronator drift.   Gait normal.    Assessment and Plan:  History of fall within past 90 days Secondary to unfamiliar surroundings and unlighted step.  Neurologic exam is normal and facial hematoma is not causing problems with chewing.  Continue nsaids and analgesics and ice for 15 minutes every 6 hours as needed to knees and right zygoma  Cervicalgia of occipito-atlanto-axial region The fall did not aggravated her cervical OA/DJD  Grief  Patient is dealing with the unexpected loss of spouse and has adequate coping skills and emotional support .  i have asked patinet to return in one month to examine for signs of unresolving grief.   A total of 30 minutes was spent with patient more than half of which was spent in counseling, reviewing records from other prviders and coordination of care.  Updated Medication List Outpatient Encounter Prescriptions as of 04/05/2014  Medication Sig  . acetaminophen (TYLENOL) 500 MG tablet Take 500 mg by mouth every 6 (six) hours as needed for pain.  Marland Kitchen ALPRAZolam (XANAX) 0.25 MG tablet Take 1 tablet (0.25 mg total) by mouth 2 (two) times daily as needed for anxiety.  Marland Kitchen aspirin 81 MG tablet Take 81 mg by mouth daily.  . Calcium Carbonate-Vitamin D (CALCIUM + D) 600-200 MG-UNIT TABS Take 1 tablet by mouth daily.    . cholecalciferol (VITAMIN D) 1000 UNITS tablet Take 1,000 Units by mouth daily.    . fish oil-omega-3  fatty acids 1000 MG capsule Take 2 g by mouth daily.  . furosemide (LASIX) 20 MG tablet Once daily as needed for fluid retention  . levothyroxine (SYNTHROID, LEVOTHROID) 75 MCG tablet Take 1 tablet (75 mcg total) by mouth daily.  Marland Kitchen losartan (COZAAR) 25 MG tablet Take 1 tablet (25 mg total) by mouth daily.  . metoprolol succinate (TOPROL XL) 25 MG 24 hr tablet Take 1 tablet (25 mg total) by mouth daily.  . Multiple Vitamins-Minerals (OSTEO COMPLEX PO) Take one by mouth daily  . omeprazole (PRILOSEC) 20 MG capsule Take 1 capsule (20 mg total) by mouth daily.  . polyethylene glycol (MIRALAX / GLYCOLAX) packet Take 17 g by mouth daily.  . vitamin B-12 (CYANOCOBALAMIN) 1000 MCG tablet Take 1,000 mcg by mouth daily.  . [DISCONTINUED] ALPRAZolam (XANAX) 0.25 MG tablet Take  1 tablet (0.25 mg total) by mouth at bedtime as needed for anxiety.     No orders of the defined types were placed in this encounter.    Return in about 4 weeks (around 05/03/2014).

## 2014-04-05 NOTE — Progress Notes (Signed)
Pre-visit discussion using our clinic review tool. No additional management support is needed unless otherwise documented below in the visit note.  

## 2014-04-08 DIAGNOSIS — F4321 Adjustment disorder with depressed mood: Secondary | ICD-10-CM | POA: Insufficient documentation

## 2014-04-08 NOTE — Assessment & Plan Note (Signed)
Secondary to unfamiliar surroundings and unlighted step.  Neurologic exam is normal and facial hematoma is not causing problems with chewing.  Continue nsaids and analgesics and ice for 15 minutes every 6 hours as needed to knees and right zygoma

## 2014-04-08 NOTE — Assessment & Plan Note (Signed)
Patient is dealing with the unexpected loss of spouse and has adequate coping skills and emotional support .  i have asked patinet to return in one month to examine for signs of unresolving grief.   

## 2014-04-08 NOTE — Assessment & Plan Note (Signed)
The fall did not aggravated her cervical OA/DJD

## 2014-05-04 ENCOUNTER — Ambulatory Visit (INDEPENDENT_AMBULATORY_CARE_PROVIDER_SITE_OTHER): Payer: Medicare HMO | Admitting: Internal Medicine

## 2014-05-04 ENCOUNTER — Other Ambulatory Visit: Payer: Self-pay | Admitting: Internal Medicine

## 2014-05-04 ENCOUNTER — Encounter: Payer: Self-pay | Admitting: Internal Medicine

## 2014-05-04 VITALS — BP 120/68 | HR 80 | Temp 98.0°F | Resp 16 | Ht 64.25 in | Wt 139.8 lb

## 2014-05-04 DIAGNOSIS — Z23 Encounter for immunization: Secondary | ICD-10-CM

## 2014-05-04 DIAGNOSIS — F4321 Adjustment disorder with depressed mood: Secondary | ICD-10-CM

## 2014-05-04 DIAGNOSIS — Z9181 History of falling: Secondary | ICD-10-CM

## 2014-05-04 DIAGNOSIS — E039 Hypothyroidism, unspecified: Secondary | ICD-10-CM

## 2014-05-04 DIAGNOSIS — F5102 Adjustment insomnia: Secondary | ICD-10-CM

## 2014-05-04 NOTE — Progress Notes (Signed)
Pre-visit discussion using our clinic review tool. No additional management support is needed unless otherwise documented below in the visit note.  

## 2014-05-04 NOTE — Patient Instructions (Signed)
You can reduce your alprazolam to 1/2 tablet at night for a week, then stop if not needed  valeria root and melatonin are both safe alternatives to try  Please get yourself some Boost or Ensure to drink instead of skipping meals

## 2014-05-05 ENCOUNTER — Encounter: Payer: Self-pay | Admitting: Internal Medicine

## 2014-05-05 LAB — T4 AND TSH
T4, Total: 9.5 ug/dL (ref 4.5–12.0)
TSH: 2.28 u[IU]/mL (ref 0.450–4.500)

## 2014-05-06 NOTE — Assessment & Plan Note (Addendum)
Secondary to unexpected loss of husband.  Her insomnia has improved and she is coping well .  Suggested that she reduce alprazolam to 1/2 tablet nightly as needed .  Addressed weight loss with advice to supplement diet with Boost or Ensure instead of skipping breakfast.

## 2014-05-06 NOTE — Progress Notes (Signed)
Patient ID: Kim Santiago, female   DOB: 1935-08-30, 79 y.o.   MRN: [id number]   Patient Active Problem List   Diagnosis Date Noted  . History of fall within past 90 days 04/08/2014  . Grief 04/08/2014  . Sjogren's syndrome 03/09/2014  . Insomnia due to stress 03/06/2014  . Atypical pigmented skin lesion 09/22/2013  . Cervicalgia of occipito-atlanto-axial region 09/22/2013  . Edema 12/28/2012  . Other and unspecified hyperlipidemia 12/28/2012  . Routine general medical examination at a health care facility 12/28/2012  . Stress-induced cardiomyopathy   . Unspecified hypothyroidism 10/02/2011  . Screening for osteoporosis     Subjective:  CC:   Chief Complaint  Patient presents with  . Follow-up    4 week on anxiety issue and medication.    HPI:   Kim Santiago is a 79 y.o. female who presents for One month follow up on grief following the loss of her husband.  She remains tearful but feels she is coping well.  She has had tremendous emotional support from friends and family and her son in law is helping her manage her financial affairs .  She continues to have decreased appetite and has lost 4 lbs in the last month.  She is sleeping a little better and wants to try stopping the alprazolam she has been taking.   At her last visit she had suffered a  fall with blunt injury to right cheek  And both knees.. The fall occurred at her daughter's home where she had been staying after the unexpected death of her husband.  She fell down one unlighted step and struck her face on the cement floor .  Er visit July 28th  No fractures to either patella or facial bones but Ct showed right sided facial hematoma.  She denies LOC headache, visual changes, changes in mental status and pain with chewing.     Past Medical History  Diagnosis Date  . Cancer     Thyroid  . Menopause 40s    natural, hot flashes and mood lability now gone, off prempro 7 months  . Osteoporosis   . hypothyroidism      secondary to thyroidectomy for thyroid ca  . Hypertension   . Myocardial infarction 2013  . Stress-induced cardiomyopathy September of 2013    EF 35%. Peak troponin was 1.8.    Past Surgical History  Procedure Laterality Date  . Kyphosis surgery  Feb 2008    L1, Dr. Mauri Pole  . Cholecystectomy    . Tonsillectomy    . Tubal ligation    . Lumbar disc surgery      L4-L5  . Shoulder arthroscopy  2004    Left, Dr. Jefm Bryant  . Thyroidectomy      Thyroid Cancer  . Spine surgery      L4-5 diskectomy  . Cardiac catheterization  05/2012    [facility]. No significant CAD. Ejection fraction of 35% due to stress-induced cardiomyopathy.       The following portions of the patient's history were reviewed and updated as appropriate: Allergies, current medications, and problem list.    Review of Systems:   Patient denies headache, fevers, malaise, unintentional weight loss, skin rash, eye pain, sinus congestion and sinus pain, sore throat, dysphagia,  hemoptysis , cough, dyspnea, wheezing, chest pain, palpitations, orthopnea, edema, abdominal pain, nausea, melena, diarrhea, constipation, flank pain, dysuria, hematuria, urinary  Frequency, nocturia, numbness, tingling, seizures,  Focal weakness, Loss of consciousness,  Tremor, insomnia, depression, anxiety, and  suicidal ideation.     History   Social History  . Marital Status: Married    Spouse Name: N/A    Number of Children: N/A  . Years of Education: N/A   Occupational History  . Not on file.   Social History Main Topics  . Smoking status: Never Smoker   . Smokeless tobacco: Never Used  . Alcohol Use: No  . Drug Use: No  . Sexual Activity: Not on file   Other Topics Concern  . Not on file   Social History Narrative  . No narrative on file    Objective:  Filed Vitals:   05/04/14 1121  BP: 120/68  Pulse: 80  Temp: 98 F (36.7 C)  Resp: 16     General appearance: alert, cooperative and appears stated age Ears:  normal TM's and external ear canals both ears Throat: lips, mucosa, and tongue normal; teeth and gums normal Neck: no adenopathy, no carotid bruit, supple, symmetrical, trachea midline and thyroid not enlarged, symmetric, no tenderness/mass/nodules Back: symmetric, no curvature. ROM normal. No CVA tenderness. Lungs: clear to auscultation bilaterally Heart: regular rate and rhythm, S1, S2 normal, no murmur, click, rub or gallop Abdomen: soft, non-tender; bowel sounds normal; no masses,  no organomegaly Pulses: 2+ and symmetric Skin: Skin color, texture, turgor normal. No rashes or lesions Lymph nodes: Cervical, supraclavicular, and axillary nodes normal.  Assessment and Plan:  Grief Secondary to unexpected loss of husband.  Her insomnia has improved and she is coping well .  Suggested that she reduce alprazolam to 1/2 tablet nightly as needed .  Addressed weight loss with advice to supplement diet with Boost or Ensure instead of skipping breakfast.   History of fall within past 90 days She has recovered from her facial injuries and has not had any more falls.  She denies any balance issues and her gait is normal today/  Insomnia due to stress Advised to reduce alprazolam to 1/2 tablet at bedtime for a week, then stop.  otc herbal alternatives discussed.   Unspecified hypothyroidism Thyroid function wasoveractive on prior dose.  She has been taking a  lower dose of levothyroxine for the past 6 weeks and repeat TSH is normal .  Lab Results  Component Value Date   TSH 2.280 05/04/2014       Updated Medication List Outpatient Encounter Prescriptions as of 05/04/2014  Medication Sig  . acetaminophen (TYLENOL) 500 MG tablet Take 500 mg by mouth every 6 (six) hours as needed for pain.  Marland Kitchen ALPRAZolam (XANAX) 0.25 MG tablet Take 1 tablet (0.25 mg total) by mouth 2 (two) times daily as needed for anxiety.  Marland Kitchen aspirin 81 MG tablet Take 81 mg by mouth daily.  . Calcium Carbonate-Vitamin D  (CALCIUM + D) 600-200 MG-UNIT TABS Take 1 tablet by mouth daily.    . cholecalciferol (VITAMIN D) 1000 UNITS tablet Take 1,000 Units by mouth daily.    . fish oil-omega-3 fatty acids 1000 MG capsule Take 2 g by mouth daily.  . furosemide (LASIX) 20 MG tablet Once daily as needed for fluid retention  . levothyroxine (SYNTHROID, LEVOTHROID) 75 MCG tablet Take 1 tablet (75 mcg total) by mouth daily.  Marland Kitchen losartan (COZAAR) 25 MG tablet Take 1 tablet (25 mg total) by mouth daily.  . metoprolol succinate (TOPROL XL) 25 MG 24 hr tablet Take 1 tablet (25 mg total) by mouth daily.  . Multiple Vitamins-Minerals (OSTEO COMPLEX PO) Take one by mouth daily  . omeprazole (PRILOSEC)  20 MG capsule Take 1 capsule (20 mg total) by mouth daily.  . polyethylene glycol (MIRALAX / GLYCOLAX) packet Take 17 g by mouth daily.  . vitamin B-12 (CYANOCOBALAMIN) 1000 MCG tablet Take 1,000 mcg by mouth daily.     Orders Placed This Encounter  Procedures  . T4 AND TSH    No Follow-up on file.

## 2014-05-06 NOTE — Assessment & Plan Note (Signed)
Thyroid function wasoveractive on prior dose.  She has been taking a  lower dose of levothyroxine for the past 6 weeks and repeat TSH is normal .  Lab Results  Component Value Date   TSH 2.280 05/04/2014

## 2014-05-06 NOTE — Assessment & Plan Note (Signed)
She has recovered from her facial injuries and has not had any more falls.  She denies any balance issues and her gait is normal today/

## 2014-05-06 NOTE — Assessment & Plan Note (Signed)
Advised to reduce alprazolam to 1/2 tablet at bedtime for a week, then stop.  otc herbal alternatives discussed.

## 2014-05-08 LAB — FERRITIN: Ferritin: 451 ng/mL — ABNORMAL HIGH (ref 15–150)

## 2014-05-08 LAB — SPECIMEN STATUS REPORT

## 2014-05-23 ENCOUNTER — Encounter: Payer: Self-pay | Admitting: Internal Medicine

## 2014-05-23 MED ORDER — ESCITALOPRAM OXALATE 10 MG PO TABS: 10.0000 mg | ORAL_TABLET | Freq: Every day | ORAL | Status: DC

## 2014-05-28 ENCOUNTER — Telehealth: Payer: Self-pay | Admitting: Internal Medicine

## 2014-05-28 ENCOUNTER — Ambulatory Visit (INDEPENDENT_AMBULATORY_CARE_PROVIDER_SITE_OTHER): Payer: Medicare HMO | Admitting: Internal Medicine

## 2014-05-28 ENCOUNTER — Encounter: Payer: Self-pay | Admitting: Internal Medicine

## 2014-05-28 VITALS — BP 156/82 | HR 78 | Temp 97.6°F | Resp 16 | Wt 139.2 lb

## 2014-05-28 DIAGNOSIS — N3000 Acute cystitis without hematuria: Secondary | ICD-10-CM

## 2014-05-28 DIAGNOSIS — F321 Major depressive disorder, single episode, moderate: Secondary | ICD-10-CM

## 2014-05-28 DIAGNOSIS — F4322 Adjustment disorder with anxiety: Secondary | ICD-10-CM

## 2014-05-28 DIAGNOSIS — F5102 Adjustment insomnia: Secondary | ICD-10-CM

## 2014-05-28 DIAGNOSIS — R300 Dysuria: Secondary | ICD-10-CM

## 2014-05-28 LAB — POCT URINALYSIS DIPSTICK
BILIRUBIN UA: NEGATIVE
Glucose, UA: NEGATIVE
Nitrite, UA: NEGATIVE
PROTEIN UA: NEGATIVE
Spec Grav, UA: 1.01
Urobilinogen, UA: 0.2
pH, UA: 7

## 2014-05-28 MED ORDER — ONDANSETRON 4 MG PO TBDP: 4.0000 mg | ORAL_TABLET | Freq: Three times a day (TID) | ORAL | Status: DC | PRN

## 2014-05-28 MED ORDER — MIRTAZAPINE 7.5 MG PO TABS: 7.5000 mg | ORAL_TABLET | Freq: Every day | ORAL | Status: DC

## 2014-05-28 MED ORDER — ALPRAZOLAM 0.25 MG PO TABS: 0.2500 mg | ORAL_TABLET | Freq: Two times a day (BID) | ORAL | Status: DC | PRN

## 2014-05-28 MED ORDER — CIPROFLOXACIN HCL 250 MG PO TABS: 250.0000 mg | ORAL_TABLET | Freq: Two times a day (BID) | ORAL | Status: DC

## 2014-05-28 NOTE — Telephone Encounter (Signed)
fyi

## 2014-05-28 NOTE — Telephone Encounter (Signed)
Scheduled patient 6.30 PM for possible UTI. FYI

## 2014-05-28 NOTE — Patient Instructions (Addendum)
The nausea may be coming from the escitalopram (lexapro).. Stop the medication  We are going to change your anti depressant to mirtazipine to take at bedtime  Zofran (ondansetron ) for nausea,, it is NOT sedating    Ciprofloxacin for the urinary tract infection

## 2014-05-28 NOTE — Telephone Encounter (Signed)
Patient Information:  Caller Name: Farzana  Phone: [phone]  Patient: Kim Santiago, Kim Santiago  Gender: Female  DOB: 1935-03-08  Age: 79 Years  PCP: Deborra Medina (Adults only)  Office Follow Up:  Does the office need to follow up with this patient?: Yes  Instructions For The Office: Needs Visit Urinary pressure and pain  RN Note:  Patient calling regarding pain with urination since 05/26/14 now has nausea.  Symptoms  Reason For Call & Symptoms: frequency, vaginal burning, nausea  Reviewed Health History In EMR: Yes  Reviewed Medications In EMR: Yes  Reviewed Allergies In EMR: Yes  Reviewed Surgeries / Procedures: Yes  Date of Onset of Symptoms: 05/25/2014  Treatments Tried: azo  Treatments Tried Worked: No  Guideline(s) Used:  Urination Pain - Female  Disposition Per Guideline:   Go to Office Now  Reason For Disposition Reached:   Severe pain with urination  Advice Given:  Call Back If:  You become worse.  Patient Will Follow Care Advice:  YES

## 2014-05-28 NOTE — Telephone Encounter (Signed)
Patient Information:  Caller Name: Kim Santiago Son in [place]  Phone: [phone]  Patient: Kim Santiago  Gender: Female  DOB: 1935-03-02  Age: 79 Years  PCP: Deborra Medina (Adults only)  Office Follow Up:  Does the office need to follow up with this patient?: Yes  Instructions For The Office: Reviewed symptoms with patient and son in law.  I apologized for any confusion. (1) He states yesterday was the anniversary of two months since her husband passed away and she is very emotional. (2) Just moved into a new house last monday  (3) Not sleeping at night only 3-4 hours. Please make Dr. Derrel Nip aware.  RN Note:  Reviewed symptoms with patient and son in law.  I apologized for any confusion. (1) He states yesterday was the anniversary of two months since her husband passed away and she is very emotional. (2) Just moved into a new house last monday  (3) Not sleeping at night only 3-4 hours. Please make Dr. Derrel Nip aware.  Symptoms  Reason For Call & Symptoms: Son in law Kim Santiago is calling concerning Kim Santiago.  He states she called this to morning 05/28/14 to make an appt for UTI. She was evaluated by the nurse.  The office called her back to schedule her appt.   She was scheduled by the office for 18:30 today 05/28/14.  . In addition to UTI, she  has a feeling of "trembling on inside", burning with urination and  leg cramps. intermittent.   +UOP last time 30 minutes ago. Kim Santiago told her  son inlaw that the nurse told her that  "they  would address the UTI but nothing else this visit. " He is concerned and upset.  Reviewed Health History In EMR: Yes  Reviewed Medications In EMR: Yes  Reviewed Allergies In EMR: Yes  Reviewed Surgeries / Procedures: Yes  Date of Onset of Symptoms: 05/26/2014  Treatments Tried: Azo (x1 dose) last night , +drinking fluids  Treatments Tried Worked: No  Guideline(s) Used:  Urination Pain - Female  Disposition Per Guideline:   See Today in Office  Reason For  Disposition Reached:   Age > 50 years  Advice Given:  Fluids:   Drink extra fluids. Drink 8-10 glasses of liquids a day (Reason: to produce a dilute, non-irritating urine).  Cranberry Juice:   Some people think that drinking cranberry juice may help in fighting urinary tract infections. However, there is no good research that has ever proved this.  Call Back If:  You become worse.  RN Overrode Recommendation:  Document Patient  Reviewed symptoms with patient and son in law.  I apologized for any confusion. (1) He states yesterday was the anniversary of two months since her husband passed away and she is very emotional. (2) Just moved into a new house last monday  (3) Not sleeping at night only 3-4 hours. Please make Dr. Derrel Nip aware.

## 2014-05-28 NOTE — Telephone Encounter (Signed)
Patient aware of 6.30 appointment. FYI Son -in - law Called with added symptoms thought  you should be aware of. FYI

## 2014-05-28 NOTE — Progress Notes (Signed)
Pre-visit discussion using our clinic review tool. No additional management support is needed unless otherwise documented below in the visit note.  

## 2014-05-29 DIAGNOSIS — F331 Major depressive disorder, recurrent, moderate: Secondary | ICD-10-CM | POA: Insufficient documentation

## 2014-05-29 NOTE — Progress Notes (Signed)
Patient ID: Kim Santiago, female   DOB: April 14, 1935, 79 y.o.   MRN: [id number]   Patient Active Problem List   Diagnosis Date Noted  . Depression, major, single episode, moderate 05/29/2014  . History of fall within past 90 days 04/08/2014  . Grief 04/08/2014  . Sjogren's syndrome 03/09/2014  . Insomnia due to stress 03/06/2014  . Atypical pigmented skin lesion 09/22/2013  . Cervicalgia of occipito-atlanto-axial region 09/22/2013  . Edema 12/28/2012  . Other and unspecified hyperlipidemia 12/28/2012  . Routine general medical examination at a health care facility 12/28/2012  . Stress-induced cardiomyopathy   . Unspecified hypothyroidism 10/02/2011  . Screening for osteoporosis     Subjective:  CC:   Chief Complaint  Patient presents with  . Urinary Tract Infection    burning, increased frequency, nausea , cramping in legs.    HPI:   Kim Santiago is a 79 y.o. female who presents for Multiple issues.  1) Dysuria and frequency for 2 days,  With improving dysuria today after taking AZO.   2) Nausea, anorexia, disrupted sleep for several days,  Patient has been seen monthly since June 30th , initially for wellness exam,  Then on or around July 30 for ER follow up post fall,  and for grief with insomnia, following the unexpected loss of her husband .  On August 28th, her third monthly visit,  I suggested starting an antidepressant due to persistent complicated grief now with signs of depression.  She declined,  But e mailed me on Sept 16th with a request to start pharmacotherapy.   started taking Lexapro 10 mg on Sept 16th.  She did NOT start with 1/2 tablet as advised in my e mail, and has been having nausea daily .  She is accompanied by her daughter today.    Past Medical History  Diagnosis Date  . Cancer     Thyroid  . Menopause 40s    natural, hot flashes and mood lability now gone, off prempro 7 months  . Osteoporosis   . hypothyroidism     secondary to  thyroidectomy for thyroid ca  . Hypertension   . Myocardial infarction 2013  . Stress-induced cardiomyopathy September of 2013    EF 35%. Peak troponin was 1.8.    Past Surgical History  Procedure Laterality Date  . Kyphosis surgery  Feb 2008    L1, Dr. Mauri Pole  . Cholecystectomy    . Tonsillectomy    . Tubal ligation    . Lumbar disc surgery      L4-L5  . Shoulder arthroscopy  2004    Left, Dr. Jefm Bryant  . Thyroidectomy      Thyroid Cancer  . Spine surgery      L4-5 diskectomy  . Cardiac catheterization  05/2012    [facility]. No significant CAD. Ejection fraction of 35% due to stress-induced cardiomyopathy.       The following portions of the patient's history were reviewed and updated as appropriate: Allergies, current medications, and problem list.    Review of Systems:   Patient denies headache, fevers, malaise, unintentional weight loss, skin rash, eye pain, sinus congestion and sinus pain, sore throat, dysphagia,  hemoptysis , cough, dyspnea, wheezing, chest pain, palpitations, orthopnea, edema, abdominal pain, nausea, melena, diarrhea, constipation, flank pain, dysuria, hematuria, urinary  Frequency, nocturia, numbness, tingling, seizures,  Focal weakness, Loss of consciousness,  Tremor, insomnia, depression, anxiety, and suicidal ideation.     History   Social History  . Marital  Status: Married    Spouse Name: N/A    Number of Children: N/A  . Years of Education: N/A   Occupational History  . Not on file.   Social History Main Topics  . Smoking status: Never Smoker   . Smokeless tobacco: Never Used  . Alcohol Use: No  . Drug Use: No  . Sexual Activity: Not on file   Other Topics Concern  . Not on file   Social History Narrative  . No narrative on file    Objective:  Filed Vitals:   05/28/14 1841  BP: 156/82  Pulse: 78  Temp: 97.6 F (36.4 C)  Resp: 16     General appearance: alert, cooperative and appears stated age Ears: normal TM's and  external ear canals both ears Throat: lips, mucosa, and tongue normal; teeth and gums normal Neck: no adenopathy, no carotid bruit, supple, symmetrical, trachea midline and thyroid not enlarged, symmetric, no tenderness/mass/nodules Back: symmetric, no curvature. ROM normal. No CVA tenderness. Lungs: clear to auscultation bilaterally Heart: regular rate and rhythm, S1, S2 normal, no murmur, click, rub or gallop Abdomen: soft, non-tender; bowel sounds normal; no masses,  no organomegaly Pulses: 2+ and symmetric Skin: Skin color, texture, turgor normal. No rashes or lesions Lymph nodes: Cervical, supraclavicular, and axillary nodes normal.  Assessment and Plan:  Urinary tract infection Empiric treatment with cipro 250 mg bid x 5 days,  Pending urine culture.   Insomnia due to stress Alprazolam was refilled at patient's request.  She is taking 0.25 mg tablet .The risks and benefits of benzodiazepine use were discussed with patient today including excessive sedation leading to respiratory depression,  impaired thinking/driving, and addiction.  Patient was advised to avoid concurrent use with alcohol, to use medication only as needed and not to share with others  .   Depression, major, single episode, moderate secondary to loss of husband, having nausea with lexapro  Discussed stopping therapy and resuming with Remeron to help managed anorexia, insomnia and lack of energy.  Return in 3 weeks after 7.5 mg start.   A total of 25 minutes of face to face time was spent with patient more than half of which was spent in counselling and coordination of care   Updated Medication List Outpatient Encounter Prescriptions as of 05/28/2014  Medication Sig  . acetaminophen (TYLENOL) 500 MG tablet Take 500 mg by mouth every 6 (six) hours as needed for pain.  Marland Kitchen ALPRAZolam (XANAX) 0.25 MG tablet Take 1 tablet (0.25 mg total) by mouth 2 (two) times daily as needed for anxiety.  Marland Kitchen aspirin 81 MG tablet Take 81 mg  by mouth daily.  . Calcium Carbonate-Vitamin D (CALCIUM + D) 600-200 MG-UNIT TABS Take 1 tablet by mouth daily.    . cholecalciferol (VITAMIN D) 1000 UNITS tablet Take 1,000 Units by mouth daily.    Marland Kitchen escitalopram (LEXAPRO) 10 MG tablet Take 1 tablet (10 mg total) by mouth daily.  . fish oil-omega-3 fatty acids 1000 MG capsule Take 2 g by mouth daily.  . furosemide (LASIX) 20 MG tablet Once daily as needed for fluid retention  . levothyroxine (SYNTHROID, LEVOTHROID) 75 MCG tablet Take 1 tablet (75 mcg total) by mouth daily.  Marland Kitchen losartan (COZAAR) 25 MG tablet Take 1 tablet (25 mg total) by mouth daily.  . metoprolol succinate (TOPROL XL) 25 MG 24 hr tablet Take 1 tablet (25 mg total) by mouth daily.  . Multiple Vitamins-Minerals (OSTEO COMPLEX PO) Take one by mouth daily  .  omeprazole (PRILOSEC) 20 MG capsule Take 1 capsule (20 mg total) by mouth daily.  . pilocarpine (SALAGEN) 5 MG tablet Take by mouth 3 (three) times daily.  . polyethylene glycol (MIRALAX / GLYCOLAX) packet Take 17 g by mouth daily.  . vitamin B-12 (CYANOCOBALAMIN) 1000 MCG tablet Take 1,000 mcg by mouth daily.  . [DISCONTINUED] ALPRAZolam (XANAX) 0.25 MG tablet Take 1 tablet (0.25 mg total) by mouth 2 (two) times daily as needed for anxiety.  . ciprofloxacin (CIPRO) 250 MG tablet Take 1 tablet (250 mg total) by mouth 2 (two) times daily.  . mirtazapine (REMERON) 7.5 MG tablet Take 1 tablet (7.5 mg total) by mouth at bedtime.  . ondansetron (ZOFRAN-ODT) 4 MG disintegrating tablet Take 1 tablet (4 mg total) by mouth every 8 (eight) hours as needed for nausea or vomiting.     Orders Placed This Encounter  Procedures  . Urine Culture  . Urinalysis, Routine w reflex microscopic  . POCT Urinalysis Dipstick    No Follow-up on file.

## 2014-05-29 NOTE — Assessment & Plan Note (Signed)
Alprazolam was refilled at patient's request.  She is taking 0.25 mg tablet .The risks and benefits of benzodiazepine use were discussed with patient today including excessive sedation leading to respiratory depression,  impaired thinking/driving, and addiction.  Patient was advised to avoid concurrent use with alcohol, to use medication only as needed and not to share with others  .

## 2014-05-29 NOTE — Assessment & Plan Note (Signed)
Empiric treatment with cipro 250 mg bid x 5 days,  Pending urine culture.

## 2014-05-29 NOTE — Assessment & Plan Note (Signed)
secondary to loss of husband, having nausea with lexapro  Discussed stopping therapy and resuming with Remeron to help managed anorexia, insomnia and lack of energy.  Return in 3 weeks after 7.5 mg start.

## 2014-06-11 ENCOUNTER — Ambulatory Visit (INDEPENDENT_AMBULATORY_CARE_PROVIDER_SITE_OTHER): Payer: Medicare HMO | Admitting: Internal Medicine

## 2014-06-11 ENCOUNTER — Encounter: Payer: Self-pay | Admitting: Internal Medicine

## 2014-06-11 VITALS — BP 128/68 | HR 79 | Temp 98.0°F | Resp 14 | Ht 64.25 in | Wt 135.5 lb

## 2014-06-11 DIAGNOSIS — E034 Atrophy of thyroid (acquired): Secondary | ICD-10-CM

## 2014-06-11 DIAGNOSIS — E785 Hyperlipidemia, unspecified: Secondary | ICD-10-CM

## 2014-06-11 DIAGNOSIS — M3500 Sicca syndrome, unspecified: Secondary | ICD-10-CM

## 2014-06-11 DIAGNOSIS — C73 Malignant neoplasm of thyroid gland: Secondary | ICD-10-CM

## 2014-06-11 DIAGNOSIS — F5102 Adjustment insomnia: Secondary | ICD-10-CM

## 2014-06-11 DIAGNOSIS — E038 Other specified hypothyroidism: Secondary | ICD-10-CM

## 2014-06-11 DIAGNOSIS — I252 Old myocardial infarction: Secondary | ICD-10-CM

## 2014-06-11 DIAGNOSIS — F4321 Adjustment disorder with depressed mood: Secondary | ICD-10-CM

## 2014-06-11 DIAGNOSIS — F321 Major depressive disorder, single episode, moderate: Secondary | ICD-10-CM

## 2014-06-11 MED ORDER — LEVOTHYROXINE SODIUM 75 MCG PO TABS: 75.0000 ug | ORAL_TABLET | Freq: Every day | ORAL | Status: DC

## 2014-06-11 NOTE — Patient Instructions (Signed)
Please resume the trial of mirtazapine 1 hour before bedtime.  Stop the bedtime alprazolam

## 2014-06-11 NOTE — Progress Notes (Signed)
Pre-visit discussion using our clinic review tool. No additional management support is needed unless otherwise documented below in the visit note.  

## 2014-06-11 NOTE — Progress Notes (Addendum)
Patient ID: Kim Santiago, female   DOB: Feb 27, 1935, 79 y.o.   MRN: [id number]  Patient Active Problem List   Diagnosis Date Noted  . History of heart attack 06/12/2014  . Adaptive colitis 06/12/2014  . HLD (hyperlipidemia) 06/12/2014  . Cancer of thyroid 06/12/2014  . Adenomatous polyp 06/12/2014  . Depression, major, single episode, moderate 05/29/2014  . History of fall within past 90 days 04/08/2014  . Grief 04/08/2014  . Sjogren's syndrome 03/09/2014  . Insomnia due to stress 03/06/2014  . Atypical pigmented skin lesion 09/22/2013  . Cervicalgia of occipito-atlanto-axial region 09/22/2013  . Edema 12/28/2012  . Other and unspecified hyperlipidemia 12/28/2012  . Routine general medical examination at a health care facility 12/28/2012  . Stress-induced cardiomyopathy   . Iatrogenic hypothyroidism 10/02/2011  . Screening for osteoporosis     Subjective:  CC:   Chief Complaint  Patient presents with  . Follow-up    adjustment disorder, UTI    HPI:   Kim Santiago is a 79 y.o. female who presents for follow up on grief following the loss of her husband. Not sleeping well,  Has lost weight.  Has godd support from family and friends.   Past Medical History  Diagnosis Date  . Cancer     Thyroid  . Menopause 40s    natural, hot flashes and mood lability now gone, off prempro 7 months  . Osteoporosis   . hypothyroidism     secondary to thyroidectomy for thyroid ca  . Hypertension   . Myocardial infarction 2013  . Stress-induced cardiomyopathy September of 2013    EF 35%. Peak troponin was 1.8.    Past Surgical History  Procedure Laterality Date  . Kyphosis surgery  Feb 2008    L1, Dr. Mauri Pole  . Cholecystectomy    . Tonsillectomy    . Tubal ligation    . Lumbar disc surgery      L4-L5  . Shoulder arthroscopy  2004    Left, Dr. Jefm Bryant  . Thyroidectomy      Thyroid Cancer  . Spine surgery      L4-5 diskectomy  . Cardiac catheterization  05/2012     [facility]. No significant CAD. Ejection fraction of 35% due to stress-induced cardiomyopathy.       The following portions of the patient's history were reviewed and updated as appropriate: Allergies, current medications, and problem list.    Review of Systems:   Patient denies headache, fevers, malaise, unintentional weight loss, skin rash, eye pain, sinus congestion and sinus pain, sore throat, dysphagia,  hemoptysis , cough, dyspnea, wheezing, chest pain, palpitations, orthopnea, edema, abdominal pain, nausea, melena, diarrhea, constipation, flank pain, dysuria, hematuria, urinary  Frequency, nocturia, numbness, tingling, seizures,  Focal weakness, Loss of consciousness,  Tremor, insomnia, depression, anxiety, and suicidal ideation.     History   Social History  . Marital Status: Married    Spouse Name: N/A    Number of Children: N/A  . Years of Education: N/A   Occupational History  . Not on file.   Social History Main Topics  . Smoking status: Never Smoker   . Smokeless tobacco: Never Used  . Alcohol Use: No  . Drug Use: No  . Sexual Activity: Not on file   Other Topics Concern  . Not on file   Social History Narrative  . No narrative on file    Objective:  Filed Vitals:   06/11/14 1832  BP: 128/68  Pulse: 79  Temp: 98 F (36.7 C)  Resp: 14     General appearance: alert, cooperative and appears stated age Ears: normal TM's and external ear canals both ears Throat: lips, mucosa, and tongue normal; teeth and gums normal Neck: no adenopathy, no carotid bruit, supple, symmetrical, trachea midline and thyroid not enlarged, symmetric, no tenderness/mass/nodules Back: symmetric, no curvature. ROM normal. No CVA tenderness. Lungs: clear to auscultation bilaterally Heart: regular rate and rhythm, S1, S2 normal, no murmur, click, rub or gallop Abdomen: soft, non-tender; bowel sounds normal; no masses,  no organomegaly Pulses: 2+ and symmetric Skin: Skin color,  texture, turgor normal. No rashes or lesions Lymph nodes: Cervical, supraclavicular, and axillary nodes normal.  Assessment and Plan:  Insomnia due to stress Urged to resume mirtazapine at 7.5 mg and suspend the alprazolam for now.   Grief Improving, per family and patient.  Continue counselling advised.   Depression, major, single episode, moderate Secondary to unexpected loss of husband.  Resume mirtazapine ,  follow up 2 months   HLD (hyperlipidemia) Given her normal coronaries on catheterization October 2013,  Will not treat LDl of 110    Sjogren's syndrome She is not tolerating pilocarpine due to fear of retinal damage and cost.  She has continued it.   Additional testing is anticpated  By Dr Jefm Bryant.   History of heart attack Cardiac cath was normal in 2013.  Continue metoprolol. Repeat lipids will be ordered and statin advised only id LDL > 130 given her age.   Lab Results  Component Value Date   CHOL 169 06/08/2013   HDL 40.00 06/08/2013   LDLCALC 110* 06/08/2013   LDLDIRECT 117.6 12/27/2012   TRIG 95.0 06/08/2013   CHOLHDL 4 06/08/2013   A total of 25  minutes was spent with patient more than half of which was spent in counseling patient on the above mentioned issues and coordination of care.   Updated Medication List Outpatient Encounter Prescriptions as of 06/11/2014  Medication Sig  . acetaminophen (TYLENOL) 500 MG tablet Take 500 mg by mouth every 6 (six) hours as needed for pain.  Marland Kitchen ALPRAZolam (XANAX) 0.25 MG tablet Take 1 tablet (0.25 mg total) by mouth 2 (two) times daily as needed for anxiety.  Marland Kitchen aspirin 81 MG tablet Take 81 mg by mouth daily.  . Calcium Carbonate-Vitamin D (CALCIUM + D) 600-200 MG-UNIT TABS Take 1 tablet by mouth daily.    . cholecalciferol (VITAMIN D) 1000 UNITS tablet Take 1,000 Units by mouth daily.    . fish oil-omega-3 fatty acids 1000 MG capsule Take 2 g by mouth daily.  . furosemide (LASIX) 20 MG tablet Once daily as needed for fluid  retention  . levothyroxine (SYNTHROID, LEVOTHROID) 75 MCG tablet Take 1 tablet (75 mcg total) by mouth daily.  Marland Kitchen losartan (COZAAR) 25 MG tablet Take 1 tablet (25 mg total) by mouth daily.  . metoprolol succinate (TOPROL XL) 25 MG 24 hr tablet Take 1 tablet (25 mg total) by mouth daily.  . mirtazapine (REMERON) 7.5 MG tablet Take 1 tablet (7.5 mg total) by mouth at bedtime.  . Multiple Vitamins-Minerals (OSTEO COMPLEX PO) Take one by mouth daily  . omeprazole (PRILOSEC) 20 MG capsule Take 1 capsule (20 mg total) by mouth daily.  . polyethylene glycol (MIRALAX / GLYCOLAX) packet Take 17 g by mouth daily.  . vitamin B-12 (CYANOCOBALAMIN) 1000 MCG tablet Take 1,000 mcg by mouth daily.  . [DISCONTINUED] levothyroxine (SYNTHROID, LEVOTHROID) 75 MCG tablet Take 1 tablet (75 mcg  total) by mouth daily.  . ondansetron (ZOFRAN-ODT) 4 MG disintegrating tablet Take 1 tablet (4 mg total) by mouth every 8 (eight) hours as needed for nausea or vomiting.  . pilocarpine (SALAGEN) 5 MG tablet Take by mouth 3 (three) times daily.  . [DISCONTINUED] ciprofloxacin (CIPRO) 250 MG tablet Take 1 tablet (250 mg total) by mouth 2 (two) times daily.  . [DISCONTINUED] escitalopram (LEXAPRO) 10 MG tablet Take 1 tablet (10 mg total) by mouth daily.     Orders Placed This Encounter  Procedures  . Comprehensive metabolic panel  . Lipid panel  . TSH    No Follow-up on file.

## 2014-06-12 DIAGNOSIS — D369 Benign neoplasm, unspecified site: Secondary | ICD-10-CM | POA: Insufficient documentation

## 2014-06-12 DIAGNOSIS — K589 Irritable bowel syndrome without diarrhea: Secondary | ICD-10-CM | POA: Insufficient documentation

## 2014-06-12 DIAGNOSIS — E785 Hyperlipidemia, unspecified: Secondary | ICD-10-CM | POA: Insufficient documentation

## 2014-06-12 DIAGNOSIS — I252 Old myocardial infarction: Secondary | ICD-10-CM

## 2014-06-12 DIAGNOSIS — Z85850 Personal history of malignant neoplasm of thyroid: Secondary | ICD-10-CM | POA: Insufficient documentation

## 2014-06-12 HISTORY — DX: Old myocardial infarction: I25.2

## 2014-06-12 NOTE — Assessment & Plan Note (Signed)
Given her normal coronaries on catheterization October 2013,  Will not treat LDl of 110

## 2014-06-12 NOTE — Assessment & Plan Note (Signed)
She is not tolerating pilocarpine due to fear of retinal damage and cost.  She has continued it.   Additional testing is anticpated  By Dr Jefm Bryant.

## 2014-06-12 NOTE — Assessment & Plan Note (Signed)
Secondary to unexpected loss of husband.  Resume mirtazapine ,  follow up 2 months

## 2014-06-12 NOTE — Assessment & Plan Note (Signed)
Cardiac cath was normal in 2013.  Continue metoprolol. Repeat lipids will be ordered and statin advised only id LDL > 130 given her age.   Lab Results  Component Value Date   CHOL 169 06/08/2013   HDL 40.00 06/08/2013   LDLCALC 110* 06/08/2013   LDLDIRECT 117.6 12/27/2012   TRIG 95.0 06/08/2013   CHOLHDL 4 06/08/2013

## 2014-06-12 NOTE — Assessment & Plan Note (Signed)
Urged to resume mirtazapine at 7.5 mg and suspend the alprazolam for now.

## 2014-06-12 NOTE — Assessment & Plan Note (Signed)
Improving, per family and patient.  Continue counselling advised.

## 2014-07-05 ENCOUNTER — Ambulatory Visit: Payer: Medicare HMO | Admitting: Internal Medicine

## 2014-08-23 ENCOUNTER — Encounter: Payer: Self-pay | Admitting: Internal Medicine

## 2014-08-28 MED ORDER — MIRTAZAPINE 7.5 MG PO TABS: 7.5000 mg | ORAL_TABLET | Freq: Every day | ORAL | Status: DC

## 2014-11-05 ENCOUNTER — Ambulatory Visit (INDEPENDENT_AMBULATORY_CARE_PROVIDER_SITE_OTHER): Payer: PPO | Admitting: Internal Medicine

## 2014-11-05 ENCOUNTER — Encounter: Payer: Self-pay | Admitting: Internal Medicine

## 2014-11-05 VITALS — BP 120/70 | HR 72 | Temp 97.8°F | Resp 14 | Ht 64.0 in | Wt 136.5 lb

## 2014-11-05 DIAGNOSIS — L819 Disorder of pigmentation, unspecified: Secondary | ICD-10-CM

## 2014-11-05 DIAGNOSIS — I252 Old myocardial infarction: Secondary | ICD-10-CM

## 2014-11-05 DIAGNOSIS — Z1239 Encounter for other screening for malignant neoplasm of breast: Secondary | ICD-10-CM

## 2014-11-05 DIAGNOSIS — E034 Atrophy of thyroid (acquired): Secondary | ICD-10-CM

## 2014-11-05 DIAGNOSIS — F4321 Adjustment disorder with depressed mood: Secondary | ICD-10-CM

## 2014-11-05 DIAGNOSIS — E032 Hypothyroidism due to medicaments and other exogenous substances: Secondary | ICD-10-CM

## 2014-11-05 DIAGNOSIS — Z0000 Encounter for general adult medical examination without abnormal findings: Secondary | ICD-10-CM

## 2014-11-05 DIAGNOSIS — M3500 Sicca syndrome, unspecified: Secondary | ICD-10-CM

## 2014-11-05 DIAGNOSIS — E038 Other specified hypothyroidism: Secondary | ICD-10-CM

## 2014-11-05 DIAGNOSIS — F5102 Adjustment insomnia: Secondary | ICD-10-CM

## 2014-11-05 DIAGNOSIS — C73 Malignant neoplasm of thyroid gland: Secondary | ICD-10-CM

## 2014-11-05 LAB — COMPREHENSIVE METABOLIC PANEL
ALK PHOS: 78 U/L (ref 39–117)
ALT: 5 U/L (ref 0–35)
AST: 16 U/L (ref 0–37)
Albumin: 4.1 g/dL (ref 3.5–5.2)
BILIRUBIN TOTAL: 0.4 mg/dL (ref 0.2–1.2)
BUN: 11 mg/dL (ref 6–23)
CO2: 32 meq/L (ref 19–32)
CREATININE: 0.65 mg/dL (ref 0.40–1.20)
Calcium: 9.5 mg/dL (ref 8.4–10.5)
Chloride: 99 mEq/L (ref 96–112)
GFR: 93.74 mL/min (ref >60.00)
Glucose, Bld: 84 mg/dL (ref 70–99)
Potassium: 4.5 mEq/L (ref 3.5–5.1)
SODIUM: 136 meq/L (ref 135–145)
TOTAL PROTEIN: 6.8 g/dL (ref 6.0–8.3)

## 2014-11-05 LAB — LIPID PANEL
CHOL/HDL RATIO: 4
Cholesterol: 178 mg/dL (ref 0–200)
HDL: 44.5 mg/dL (ref >39.00)
LDL Cholesterol: 113 mg/dL — ABNORMAL HIGH (ref 0–99)
NONHDL: 133.5
Triglycerides: 104 mg/dL (ref 0.0–149.0)
VLDL: 20.8 mg/dL (ref 0.0–40.0)

## 2014-11-05 LAB — TSH: TSH: 4.95 u[IU]/mL — ABNORMAL HIGH (ref 0.35–4.50)

## 2014-11-05 NOTE — Patient Instructions (Signed)
You had your annual wellness exam today,  We will call you with the results of your labs from today  You can check with your " concierge" representative about the mail order question  Health Maintenance Adopting a healthy lifestyle and getting preventive care can go a long way to promote health and wellness. Talk with your health care provider about what schedule of regular examinations is right for you. This is a good chance for you to check in with your provider about disease prevention and staying healthy. In between checkups, there are plenty of things you can do on your own. Experts have done a lot of research about which lifestyle changes and preventive measures are most likely to keep you healthy. Ask your health care provider for more information. WEIGHT AND DIET  Eat a healthy diet  Be sure to include plenty of vegetables, fruits, low-fat dairy products, and lean protein.  Do not eat a lot of foods high in solid fats, added sugars, or salt.  Get regular exercise. This is one of the most important things you can do for your health.  Most adults should exercise for at least 150 minutes each week. The exercise should increase your heart rate and make you sweat (moderate-intensity exercise).  Most adults should also do strengthening exercises at least twice a week. This is in addition to the moderate-intensity exercise.  Maintain a healthy weight  Body mass index (BMI) is a measurement that can be used to identify possible weight problems. It estimates body fat based on height and weight. Your health care provider can help determine your BMI and help you achieve or maintain a healthy weight.  For females 54 years of age and older:   A BMI below 18.5 is considered underweight.  A BMI of 18.5 to 24.9 is normal.  A BMI of 25 to 29.9 is considered overweight.  A BMI of 30 and above is considered obese.  Watch levels of cholesterol and blood lipids  You should start having your  blood tested for lipids and cholesterol at 78 years of age, then have this test every 5 years.  You may need to have your cholesterol levels checked more often if:  Your lipid or cholesterol levels are high.  You are older than 78 years of age.  You are at high risk for heart disease.  CANCER SCREENING   Lung Cancer  Lung cancer screening is recommended for adults 36-68 years old who are at high risk for lung cancer because of a history of smoking.  A yearly low-dose CT scan of the lungs is recommended for people who:  Currently smoke.  Have quit within the past 15 years.  Have at least a 30-pack-year history of smoking. A pack year is smoking an average of one pack of cigarettes a day for 1 year.  Yearly screening should continue until it has been 15 years since you quit.  Yearly screening should stop if you develop a health problem that would prevent you from having lung cancer treatment.  Breast Cancer  Practice breast self-awareness. This means understanding how your breasts normally appear and feel.  It also means doing regular breast self-exams. Let your health care provider know about any changes, no matter how small.  If you are in your 20s or 30s, you should have a clinical breast exam (CBE) by a health care provider every 1-3 years as part of a regular health exam.  If you are 76 or older, have a  CBE every year. Also consider having a breast X-ray (mammogram) every year.  If you have a family history of breast cancer, talk to your health care provider about genetic screening.  If you are at high risk for breast cancer, talk to your health care provider about having an MRI and a mammogram every year.  Breast cancer gene (BRCA) assessment is recommended for women who have family members with BRCA-related cancers. BRCA-related cancers include:  Breast.  Ovarian.  Tubal.  Peritoneal cancers.  Results of the assessment will determine the need for genetic  counseling and BRCA1 and BRCA2 testing. Cervical Cancer Routine pelvic examinations to screen for cervical cancer are no longer recommended for nonpregnant women who are considered low risk for cancer of the pelvic organs (ovaries, uterus, and vagina) and who do not have symptoms. A pelvic examination may be necessary if you have symptoms including those associated with pelvic infections. Ask your health care provider if a screening pelvic exam is right for you.   The Pap test is the screening test for cervical cancer for women who are considered at risk.  If you had a hysterectomy for a problem that was not cancer or a condition that could lead to cancer, then you no longer need Pap tests.  If you are older than 65 years, and you have had normal Pap tests for the past 10 years, you no longer need to have Pap tests.  If you have had past treatment for cervical cancer or a condition that could lead to cancer, you need Pap tests and screening for cancer for at least 20 years after your treatment.  If you no longer get a Pap test, assess your risk factors if they change (such as having a new sexual partner). This can affect whether you should start being screened again.  Some women have medical problems that increase their chance of getting cervical cancer. If this is the case for you, your health care provider may recommend more frequent screening and Pap tests.  The human papillomavirus (HPV) test is another test that may be used for cervical cancer screening. The HPV test looks for the virus that can cause cell changes in the cervix. The cells collected during the Pap test can be tested for HPV.  The HPV test can be used to screen women 64 years of age and older. Getting tested for HPV can extend the interval between normal Pap tests from three to five years.  An HPV test also should be used to screen women of any age who have unclear Pap test results.  After 78 years of age, women should have  HPV testing as often as Pap tests.  Colorectal Cancer  This type of cancer can be detected and often prevented.  Routine colorectal cancer screening usually begins at 78 years of age and continues through 78 years of age.  Your health care provider may recommend screening at an earlier age if you have risk factors for colon cancer.  Your health care provider may also recommend using home test kits to check for hidden blood in the stool.  A small camera at the end of a tube can be used to examine your colon directly (sigmoidoscopy or colonoscopy). This is done to check for the earliest forms of colorectal cancer.  Routine screening usually begins at age 59.  Direct examination of the colon should be repeated every 5-10 years through 78 years of age. However, you may need to be screened more often  if early forms of precancerous polyps or small growths are found. Skin Cancer  Check your skin from head to toe regularly.  Tell your health care provider about any new moles or changes in moles, especially if there is a change in a mole's shape or color.  Also tell your health care provider if you have a mole that is larger than the size of a pencil eraser.  Always use sunscreen. Apply sunscreen liberally and repeatedly throughout the day.  Protect yourself by wearing long sleeves, pants, a wide-brimmed hat, and sunglasses whenever you are outside. HEART DISEASE, DIABETES, AND HIGH BLOOD PRESSURE   Have your blood pressure checked at least every 1-2 years. High blood pressure causes heart disease and increases the risk of stroke.  If you are between 82 years and 14 years old, ask your health care provider if you should take aspirin to prevent strokes.  Have regular diabetes screenings. This involves taking a blood sample to check your fasting blood sugar level.  If you are at a normal weight and have a low risk for diabetes, have this test once every three years after 78 years of  age.  If you are overweight and have a high risk for diabetes, consider being tested at a younger age or more often. PREVENTING INFECTION  Hepatitis B  If you have a higher risk for hepatitis B, you should be screened for this virus. You are considered at high risk for hepatitis B if:  You were born in a country where hepatitis B is common. Ask your health care provider which countries are considered high risk.  Your parents were born in a high-risk country, and you have not been immunized against hepatitis B (hepatitis B vaccine).  You have HIV or AIDS.  You use needles to inject street drugs.  You live with someone who has hepatitis B.  You have had sex with someone who has hepatitis B.  You get hemodialysis treatment.  You take certain medicines for conditions, including cancer, organ transplantation, and autoimmune conditions. Hepatitis C  Blood testing is recommended for:  Everyone born from [birth year] through 1963.  Anyone with known risk factors for hepatitis C. Sexually transmitted infections (STIs)  You should be screened for sexually transmitted infections (STIs) including gonorrhea and chlamydia if:  You are sexually active and are younger than 78 years of age.  You are older than 78 years of age and your health care provider tells you that you are at risk for this type of infection.  Your sexual activity has changed since you were last screened and you are at an increased risk for chlamydia or gonorrhea. Ask your health care provider if you are at risk.  If you do not have HIV, but are at risk, it may be recommended that you take a prescription medicine daily to prevent HIV infection. This is called pre-exposure prophylaxis (PrEP). You are considered at risk if:  You are sexually active and do not regularly use condoms or know the HIV status of your partner(s).  You take drugs by injection.  You are sexually active with a partner who has HIV. Talk with your health  care provider about whether you are at high risk of being infected with HIV. If you choose to begin PrEP, you should first be tested for HIV. You should then be tested every 3 months for as long as you are taking PrEP.  PREGNANCY   If you are premenopausal and you may become pregnant,  ask your health care provider about preconception counseling.  If you may become pregnant, take 400 to 800 micrograms (mcg) of folic acid every day.  If you want to prevent pregnancy, talk to your health care provider about birth control (contraception). OSTEOPOROSIS AND MENOPAUSE   Osteoporosis is a disease in which the bones lose minerals and strength with aging. This can result in serious bone fractures. Your risk for osteoporosis can be identified using a bone density scan.  If you are 73 years of age or older, or if you are at risk for osteoporosis and fractures, ask your health care provider if you should be screened.  Ask your health care provider whether you should take a calcium or vitamin D supplement to lower your risk for osteoporosis.  Menopause may have certain physical symptoms and risks.  Hormone replacement therapy may reduce some of these symptoms and risks. Talk to your health care provider about whether hormone replacement therapy is right for you.  HOME CARE INSTRUCTIONS   Schedule regular health, dental, and eye exams.  Stay current with your immunizations.   Do not use any tobacco products including cigarettes, chewing tobacco, or electronic cigarettes.  If you are pregnant, do not drink alcohol.  If you are breastfeeding, limit how much and how often you drink alcohol.  Limit alcohol intake to no more than 1 drink per day for nonpregnant women. One drink equals 12 ounces of beer, 5 ounces of wine, or 1 ounces of hard liquor.  Do not use street drugs.  Do not share needles.  Ask your health care provider for help if you need support or information about quitting  drugs.  Tell your health care provider if you often feel depressed.  Tell your health care provider if you have ever been abused or do not feel safe at home. Document Released: 03/09/2011 Document Revised: 01/08/2014 Document Reviewed: 07/26/2013 [facility] Patient Information 2015 [place], [place]. This information is not intended to replace advice given to you by your health care provider. Make sure you discuss any questions you have with your health care provider.

## 2014-11-05 NOTE — Progress Notes (Signed)
Pre-visit discussion using our clinic review tool. No additional management support is needed unless otherwise documented below in the visit note.  

## 2014-11-05 NOTE — Progress Notes (Signed)
Patient ID: Kim Santiago, female   DOB: [birth year], 80 y.o.   MRN: [id number]   The patient is here for annual Medicare wellness examination and management of other chronic and acute problems.  She has been recovering from complicated grief following the loss of her husband, and has lost a significant amount of weight which has plateaued,  And continues to have insomnia secondary to recurrent awakenings triggered by the perception of a loud noise accompanied by difficult to describe visual phenomena  which are not classified as frightening   The risk factors are reflected in the social history.  The roster of all physicians providing medical care to patient - is listed in the Snapshot section of the chart.  Activities of daily living:  The patient is 100% independent in all ADLs: dressing, toileting, feeding as well as independent mobility  Home safety : The patient has smoke detectors in the home. They wear seatbelts.  There are no firearms at home. There is no violence in the home.   There is no risks for hepatitis, STDs or HIV. There is no   history of blood transfusion. They have no travel history to infectious disease endemic areas of the world.  The patient has seen their dentist in the last six month. They have seen their eye doctor in the last year. They admit to slight hearing difficulty with regard to whispered voices and some television programs.  They have deferred audiologic testing in the last year.  They do not  have excessive sun exposure. Discussed the need for sun protection: hats, long sleeves and use of sunscreen if there is significant sun exposure.   Diet: the importance of a healthy diet is discussed. They do have a healthy diet.  The benefits of regular aerobic exercise were discussed. She walks 4 times per week ,  20 minutes.   Depression screen: there are no signs or vegative symptoms of depression- irritability, change in appetite, anhedonia,  sadness/tearfullness.  Cognitive assessment: the patient manages all their financial and personal affairs and is actively engaged. They could relate day,date,year and events; recalled 2/3 objects at 3 minutes; performed clock-face test normally.  The following portions of the patient's history were reviewed and updated as appropriate: allergies, current medications, past family history, past medical history,  past surgical history, past social history  and problem list.  Visual acuity was not assessed per patient preference since she has regular follow up with her ophthalmologist. Hearing and body mass index were assessed and reviewed.   During the course of the visit the patient was educated and counseled about appropriate screening and preventive services including : fall prevention , diabetes screening, nutrition counseling, colorectal cancer screening, and recommended immunizations.    Review of systems  Patient denies headache, fevers, malaise, unintentional weight loss, skin rash, eye pain, sinus congestion and sinus pain, sore throat, dysphagia,  hemoptysis , cough, dyspnea, wheezing, chest pain, palpitations, orthopnea, edema, abdominal pain, nausea, melena, diarrhea, constipation, flank pain, dysuria, hematuria, urinary  Frequency, nocturia, numbness, tingling, seizures,  Focal weakness, Loss of consciousness,  Tremor, insomnia, depression, anxiety, and suicidal ideation.     Objective  BP 120/70 mmHg  Pulse 72  Temp(Src) 97.8 F (36.6 C) (Oral)  Resp 14  Ht 5\' 4"  (1.626 m)  Wt 136 lb 8 oz (61.916 kg)  BMI 23.42 kg/m2  SpO2 97%  General appearance: alert, cooperative and appears stated age Head: Normocephalic, without obvious abnormality, atraumatic Eyes: conjunctivae/corneas clear. PERRL, EOM's intact.  Fundi benign. Ears: normal TM's and external ear canals both ears Nose: Nares normal. Septum midline. Mucosa normal. No drainage or sinus tenderness. Throat: lips, mucosa, and  tongue normal; teeth and gums normal Neck: no adenopathy, no carotid bruit, no JVD, supple, symmetrical, trachea midline and thyroid not enlarged, symmetric, no tenderness/mass/nodules Lungs: clear to auscultation bilaterally Breasts: normal appearance, no masses or tenderness Heart: regular rate and rhythm, S1, S2 normal, no murmur, click, rub or gallop Abdomen: soft, non-tender; bowel sounds normal; no masses,  no organomegaly Extremities: extremities normal, atraumatic, no cyanosis or edema Pulses: 2+ and symmetric Skin: Skin color, texture, turgor normal. No rashes or lesions Neurologic: Alert and oriented X 3, normal strength and tone. Normal symmetric reflexes. Normal coordination and gait.  Psych: affect normal, makes good eye contact. No fidgeting,  Smiles easily.  Denies suicidal thoughts    Assessment and Plan:  Problem List Items Addressed This Visit    Sjogren's syndrome    She has stopped the pilocarpine due to cost and fear of side effects.       Routine general medical examination at a health care facility    Annual Medicare wellness  exam was done as well as a comprehensive physical exam and management of acute and chronic conditions .  During the course of the visit the patient was educated and counseled about appropriate screening and preventive services including : fall prevention , diabetes screening, nutrition counseling, colorectal cancer screening, and recommended immunizations.  Printed recommendations for health maintenance screenings was given.       Insomnia due to stress    Advised that she can try 0.5 alprazolam for insomnia and anxiety concerning future flight to [facility]       Iatrogenic hypothyroidism    Secondary to total thyroidectomy  for thyroid cancer, Thyroid function is not suppressed on current dose.  Will increase dose for goal TSH < 1.0    Lab Results  Component Value Date   TSH 4.95* 11/05/2014         Relevant Medications   levothyroxine  (SYNTHROID, LEVOTHROID) tablet   Other Relevant Orders   T4 AND TSH   History of heart attack   Grief    Complicated,  Improving with remeron.  continue current medications  Dose       Cancer of thyroid   Relevant Medications   levothyroxine (SYNTHROID, LEVOTHROID) tablet   Atypical pigmented skin lesion    S/p Moh's procedure left nasal ,  With concern for recurrene of Ca.  Will return to Dr Phillip Heal for evaluation.        Other Visit Diagnoses    Breast cancer screening    -  Primary    Relevant Orders    MM DIGITAL SCREENING BILATERAL    Hypothyroidism due to acquired atrophy of thyroid        Relevant Medications    levothyroxine (SYNTHROID, LEVOTHROID) tablet      A total of 45 minutes was spent with patient more than half of which was spent in counseling patient on the above mentioned issues , reviewing and explaining recent labs and imaging studies done, and coordination of care.

## 2014-11-06 MED ORDER — LEVOTHYROXINE SODIUM 88 MCG PO TABS: 88.0000 ug | ORAL_TABLET | Freq: Every day | ORAL | Status: DC

## 2014-11-06 NOTE — Assessment & Plan Note (Signed)
She has stopped the pilocarpine due to cost and fear of side effects.

## 2014-11-06 NOTE — Assessment & Plan Note (Signed)

## 2014-11-06 NOTE — Assessment & Plan Note (Signed)
Complicated,  Improving with remeron.  continue current medications  Dose

## 2014-11-06 NOTE — Assessment & Plan Note (Addendum)
Secondary to total thyroidectomy  for thyroid cancer, Thyroid function is not suppressed on current dose.  Will increase dose for goal TSH < 1.0    Lab Results  Component Value Date   TSH 4.95* 11/05/2014

## 2014-11-06 NOTE — Assessment & Plan Note (Signed)
S/p Moh's procedure left nasal ,  With concern for recurrene of Ca.  Will return to Dr Phillip Heal for evaluation.

## 2014-11-06 NOTE — Assessment & Plan Note (Signed)
Advised that she can try 0.5 alprazolam for insomnia and anxiety concerning future flight to [facility]

## 2014-11-07 ENCOUNTER — Encounter: Payer: Self-pay | Admitting: Internal Medicine

## 2014-12-10 ENCOUNTER — Other Ambulatory Visit: Payer: Self-pay | Admitting: *Deleted

## 2014-12-10 DIAGNOSIS — E034 Atrophy of thyroid (acquired): Secondary | ICD-10-CM

## 2014-12-10 MED ORDER — LEVOTHYROXINE SODIUM 88 MCG PO TABS: 88.0000 ug | ORAL_TABLET | Freq: Every day | ORAL | Status: DC

## 2014-12-10 MED ORDER — LOSARTAN POTASSIUM 25 MG PO TABS: 25.0000 mg | ORAL_TABLET | Freq: Every day | ORAL | Status: DC

## 2014-12-10 MED ORDER — OMEPRAZOLE 20 MG PO CPDR: 20.0000 mg | DELAYED_RELEASE_CAPSULE | Freq: Every day | ORAL | Status: DC

## 2014-12-10 MED ORDER — METOPROLOL SUCCINATE ER 25 MG PO TB24: 25.0000 mg | ORAL_TABLET | Freq: Every day | ORAL | Status: DC

## 2014-12-24 ENCOUNTER — Other Ambulatory Visit (INDEPENDENT_AMBULATORY_CARE_PROVIDER_SITE_OTHER): Payer: PPO

## 2014-12-24 DIAGNOSIS — E032 Hypothyroidism due to medicaments and other exogenous substances: Secondary | ICD-10-CM

## 2014-12-25 LAB — T4 AND TSH
T4, Total: 8.8 ug/dL (ref 4.5–12.0)
TSH: 1.12 u[IU]/mL (ref 0.450–4.500)

## 2014-12-25 NOTE — Discharge Summary (Signed)
PATIENT NAME:  Kim Santiago, Kim Santiago MR#:  [id number] DATE OF BIRTH:  [birth year]  DATE OF ADMISSION:  05/30/2012 DATE OF DISCHARGE:  06/01/2012  ADDENDUM: Aspirin 81 mg p.o. daily has been added as part of the patient's discharge medications. The patient has been notified and copies will be sent to the patient's primary care physician. ____________________________ Hart Rochester. Posey Pronto, MD sap:slb D: 06/27/2012 10:03:13 ET T: 06/27/2012 11:12:31 ET JOB#: [id number]  cc: Amayra Kiedrowski A. Posey Pronto, MD, <Dictator> Muhammad A. Fletcher Anon, MD Deborra Medina, MD Ilda Basset MD ELECTRONICALLY SIGNED 06/29/2012 11:25

## 2014-12-25 NOTE — Consult Note (Signed)
PATIENT NAME:  Kim Santiago, Kim Santiago MR#:  [id number] DATE OF BIRTH:  07-Mar-1935  DATE OF CONSULTATION:  05/30/2012  REFERRING PHYSICIAN:  Loletha Grayer, MD  CONSULTING PHYSICIAN:  Muhammad A. Fletcher Anon, MD  PRIMARY CARE PHYSICIAN: Deborra Medina, MD   REASON FOR CONSULTATION: Chest pain.   HISTORY OF PRESENT ILLNESS: This is a pleasant 80 year old Caucasian female with no previous cardiac history. She has chronic medical conditions that include hypothyroidism and compression fractures with previous back surgery. She was in her usual health. She was in [place] this morning with a friend in a coffee shop when she started having severe substernal chest discomfort described as a tightness feeling which initially was severe and rated as 8 out of 10. The discomfort subsided after about 20 minutes but actually mild discomfort continued for a few hours. She is still having minimal discomfort at this point. She denies any previous similar symptoms. She has known history of gastroesophageal reflux disease but her current symptoms are very different from that pain. She is usually very active and exercises on a regular basis without any previous cardiac symptoms. She was found to have slight elevation in troponin level. She had CT scan of the chest which showed no evidence of pulmonary embolism or aortic dissection.   PAST MEDICAL HISTORY:  1. Hypothyroidism.  2. Lower extremity edema.  3. Compression fractures in the back.  4. Gastroesophageal reflux disease.   PAST SURGICAL HISTORY:  1. Thyroidectomy for thyroid cancer.  2. Two back surgeries, one was kyphoplasty.  3. Tubal ligation.  4. Cholecystectomy.  5. Bilateral shoulder surgery.   ALLERGIES: Codeine and sulfa.   HOME MEDICATIONS:  1. Levothyroxine 88 mcg once daily.  2. Hydrochlorothiazide 12.5 mg once a day.  3. Prilosec over-the-counter once daily.  4. Calcium and Vitamin D twice daily.  5. Osteo Bi-Flex.   SOCIAL HISTORY: Negative for  smoking, alcohol, or recreational drug use. She is a retired Network engineer.   FAMILY HISTORY: Father died of esophageal cancer. Her mother did have coronary artery disease but at later stage in life. There is no family history of premature coronary artery disease.   REVIEW OF SYSTEMS: A 10 point review of systems was performed. It is negative other than what is mentioned in the history of present illness.   PHYSICAL EXAMINATION:   GENERAL: The patient appears to be at her stated age in no acute distress.   VITAL SIGNS: Temperature 98.1, pulse 81, respiratory rate 17, blood pressure 144/82, oxygen saturation 97% on room air.   HEENT: Normocephalic, atraumatic.   NECK: No JVD or carotid bruits.   RESPIRATORY: Normal respiratory effort with no use of accessory muscles. Auscultation reveals normal breath sounds.   CARDIOVASCULAR: Normal PMI. Normal S1 and S2 with no gallops or murmurs.   ABDOMEN: Benign, nontender, and nondistended.   EXTREMITIES: No clubbing, cyanosis, or edema.   SKIN: Warm and dry with no rash.   PSYCHIATRIC: She is alert, oriented x3 with normal mood and affect.   LABORATORY AND DIAGNOSTIC DATA: Her CBC and basic metabolic profile were unremarkable. Troponin was borderline elevated at 0.07 with normal CK-MB.   EKG shows normal sinus rhythm with first-degree AV block. She had septal Q waves in V1 and V2 with borderline 0.5 mm of ST elevation in these leads as well as subtle ST depression in leads III and aVF. There is also borderline elevation in lead aVL.   IMPRESSION:  1. Unstable angina, possibly non-ST elevation myocardial infarction.  2.  Borderline hypertension.  3. Borderline hyperlipidemia.  4. Gastroesophageal reflux disease.   RECOMMENDATIONS: The patient's current history and ECG are highly worrisome for unstable angina. Her first troponin was borderline elevated. We will have to see the trend but it is highly likely that she will rule in for non-ST  elevation myocardial infarction. At this time I recommend continuing aspirin daily and metoprolol. She already received one dose of Lovenox. I will start her on a heparin drip in the morning. I will add atorvastatin. I recommend proceeding with cardiac catheterization and possible coronary intervention. Risks, benefits, and alternatives were discussed with the patient.   ____________________________ Mertie Clause. Fletcher Anon, MD maa:drc D: 05/30/2012 17:21:56 ET T: 05/30/2012 17:50:06 ET JOB#: [id number]  cc: Rogue Jury A. Fletcher Anon, MD, <Dictator> Deborra Medina, MD Rogue Jury Ferne Reus MD ELECTRONICALLY SIGNED 06/10/2012 13:17

## 2014-12-25 NOTE — Discharge Summary (Signed)
PATIENT NAME:  Kim Santiago, Kim Santiago MR#:  [id number] DATE OF BIRTH:  10-Jan-1935  DATE OF ADMISSION:  05/30/2012 DATE OF DISCHARGE:  06/01/2012  PRESENTING COMPLAINT: Chest pain.   DISCHARGE DIAGNOSES:  1. Acute non-Q-wave myocardial infarction suspected from stress-induced cardiomyopathy, ejection fraction of 25% as noted on cardiac catheterization.  2. Hypertension.  3. Intermittent leg edema.  4. Gastroesophageal reflux disease.  5. Compression fractures in the back.   PROCEDURES: Cardiac catheterization showed no CAD, ejection fraction of 25%.   CONSULTATION: Cardiology consultation with Dr. Fletcher Anon.     MEDICATIONS:  1. Prilosec 20 mg p.o. daily.  2. Levothyroxine 100 mcg p.o. daily.  3. Caltrate 600 plus oral 1 tablet daily.  4. Osteo Bi-Flex 250/200 one tablet daily.  5. Hydrochlorothiazide 25 mg p.o. at bedtime as needed for leg edema.  6. Lisinopril 2.5 mg p.o. daily. This is a new medicine. 7. Famotidine 20 mg 1 tablet b.i.d. as needed for indigestion, heartburn.   DIET: Regular.   Patient advised to take hydrochlorothiazide only if you have leg edema. Keep log of blood pressure at home.   FOLLOWUP:  1. Follow up with Dr. Fletcher Anon on Thursday, June 09, 2012 at 11:15 a.m.  2. Follow up with Dr. Derrel Nip June 13, 2012 at 11 a.m.   LABORATORY DATA: CBC within normal limits. Basic metabolic panel within normal limits. LDL is 108, HDL 38. Troponin is 0.83, 0.83, 1.8. CT of the chest negative for PE or thoracic or aortic dissection or aneurysm. There appears to be kyphoplasty changes in the lumbar spine.   BRIEF SUMMARY OF HOSPITAL COURSE: [name] is a very pleasant 80 year old Caucasian female who got admitted with:  1. Acute non-Q-wave myocardial infarction. She presented with acute onset chest pain with borderline troponin and abnormal EKG. She was started on IV heparin, aspirin, statins, beta- blockers. She was seen by Dr. Fletcher Anon. Her troponin bumped to 1.8. She underwent  cardiac catheterization which surprisingly did not show any coronary artery disease; however, it did show evidence of stress-induced cardiomyopathy with ejection fraction of 25%. The patient did not have any further chest pains. Her low-dose lisinopril was added.  2. Hypertension.  3. Intermittent leg edema. Patient had been on hydrochlorothiazide which she is recommended to take it on p.r.n. basis as lisinopril has been added, and her blood pressure stays on the lower side.  4. Gastroesophageal reflux disease. Continued Prilosec and Pepcid.  5. Compression fractures in the back, could also be musculoskeletal chest pain. She is on calcium and vitamin D.  CODE STATUS: THE PATIENT REMAINED A FULL CODE.   TIME SPENT: 40 minutes.    ____________________________ Hart Rochester Posey Pronto, MD sap:vtd D: 06/01/2012 14:21:06 ET T: 06/02/2012 12:15:14 ET JOB#: [id number]  cc: Kylie Simmonds A. Posey Pronto, MD, <Dictator> Muhammad A. Fletcher Anon, MD Deborra Medina, MD Ilda Basset MD ELECTRONICALLY SIGNED 06/15/2012 9:28

## 2014-12-25 NOTE — H&P (Signed)
PATIENT NAME:  Kim Santiago, RUMINSKI MR#:  [id number] DATE OF BIRTH:  [birth year]  DATE OF ADMISSION:  05/30/2012  PRIMARY CARE PHYSICIAN: Deborra Medina, MD  CHIEF COMPLAINT: This morning developed chest pain lasting about 20 minutes.   HISTORY OF PRESENT ILLNESS: This is a 80 year old female who is healthy who developed severe chest pain lasting about 20 minutes, would not go away, she took a full aspirin 325 mg x1, and now just feels some slight pressure 2 out of 10 in intensity, was 8 out of 10 in intensity, described as a sharp dull ache, now only a slight ache. Nothing made it better or worse. Normally she does aerobics and is active. There is no sweating, no shortness of breath, and no nausea with this chest discomfort.   PAST MEDICAL HISTORY:  1. Hypothyroidism. 2. Edema. 3. Compression fractures.   PAST SURGICAL HISTORY:  1. Thyroidectomy for thyroid cancer.  2. Two back surgeries, one was a kyphoplasty. 3. Tubal ligation.  4. Cholecystectomy.  5. Tonsils. 6. Bilateral shoulder surgery.   ALLERGIES: Codeine and sulfa.   MEDICATIONS:  1. Levothyroxine 88 mcg daily.  2. Hydrochlorothiazide, she felt was the lowest dose but in the computer was written as 50 mg daily.  3. Prilosec OTC daily.  4. Calcium and vitamin D twice a day. 5. Osteo Bi-Flex.   SOCIAL HISTORY: No smoking. No alcohol. No drug use. Worked as a Network engineer in the past.   FAMILY HISTORY: Father died of cancer of the esophagus. Mother died of kidney failure. Sister on dialysis. Brother died of throat cancer.   REVIEW OF SYSTEMS: CONSTITUTIONAL: No fever, chills, or sweats. Trying to lose weight. Positive for fatigue. EYES: She does wear glasses and has cataracts. EARS, NOSE, MOUTH, AND THROAT: Positive for stuffy nose. Positive for dysphagia to solids over the past six months. CARDIOVASCULAR: Positive for chest pain. No palpitations. RESPIRATORY: No shortness of breath. No cough. No sputum. No hemoptysis.  GASTROINTESTINAL: No nausea. No vomiting. No abdominal pain. No diarrhea. No constipation. No bright red blood per rectum. No melena. GENITOURINARY: No burning on urination. No hematuria. MUSCULOSKELETAL: Positive for joint pain. INTEGUMENTARY: Positive for rash on the chest, diagnosed as keratosis. ENDOCRINE: Positive for hypothyroidism. NEUROLOGIC: No fainting or blackouts. PSYCHIATRIC: No anxiety or depression. ENDOCRINE: No anemia.   PHYSICAL EXAMINATION:   VITAL SIGNS: Temperature 98.1, pulse 81, respirations 17, blood pressure 144/82, and pulse oximetry 97% on room air.   GENERAL: No respiratory distress.   EYES: Conjunctivae and lids normal. Pupils equal, round, and reactive to light. Extraocular muscles intact. No nystagmus.   EARS, NOSE, MOUTH, AND THROAT: Tympanic membranes no erythema. Nasal mucosa no erythema. Throat no erythema. No exudate seen. Lips and gums normal.   NECK: No JVD. No bruits. No lymphadenopathy. No thyromegaly. No thyroid nodules palpated.   RESPIRATORY: Lungs are clear to auscultation. No use of accessory muscles to breathe. No rhonchi, rales, or wheeze heard.   CARDIOVASCULAR: S1 and S2 normal. No gallops, rubs, or murmurs heard. Carotid upstroke 2+ bilaterally. No bruits. Distal pulses 2+ bilaterally. No edema of the lower extremities.   ABDOMEN: Soft and nontender. No organosplenomegaly. Normoactive bowel sounds. No masses felt.   LYMPHATIC: No lymph nodes in the neck.   MUSCULOSKELETAL: The patient does have back pain and compression fracture in the thoracic spine which is known to her and intermittently gives her some issues. Pain is left of center in the back.   SKIN: Positive rash anteriorly, macular.  NEUROLOGIC: Cranial nerves II through XII grossly intact. Deep tendon reflexes absent in bilateral lower extremities.   PSYCHIATRIC: The patient is oriented to person, place, and time.   LABS/RADIOLOGIC STUDIES: CT scan of the chest was negative for  PE or dissection.   White blood cell count 5.8, hemoglobin and hematocrit 15.2 and 43.4, and platelet count 215. Glucose 133, BUN 11, creatinine 0.53, sodium 137, potassium 4.1, chloride 100, CO2 29, and calcium 9.4. Troponin borderline at 0.07.   EKG: Sinus rhythm, first-degree AV block, 79 beats per minute, right ward axis, low voltage, and Q waves septally.   ASSESSMENT AND PLAN:  1. Chest pain with borderline troponin and abnormal EKG. The patient already took an aspirin today. We will add Toprol-XL and one dose of Lovenox. We will ask cardiology to see the patient. Either will order a stress test or cardiac catheterization based on the enzymes. We will also put her on telemetry.  2. Hypertension and edema. We will add Toprol-XL and decrease the dose of hydrochlorothiazide 25 mg daily.  3. Gastroesophageal reflux disease. Continue Prilosec.      4. Compression fractures on the back, could also cause musculoskeletal chest pain. The patient is on calcium and vitamin D. Can consider Fosamax as outpatient.  TIME SPENT ON ADMISSION: 55 minutes.  ____________________________ Tana Conch. Leslye Peer, MD rjw:slb D: 05/30/2012 15:51:45 ET T: 05/30/2012 16:57:53 ET JOB#: [id number]  cc: Tana Conch. Leslye Peer, MD, <Dictator> Deborra Medina, MD Marisue Brooklyn MD ELECTRONICALLY SIGNED 06/07/2012 21:21

## 2014-12-25 NOTE — Consult Note (Signed)
Brief Consult Note: Diagnosis: unstable angina possible NSTEMI.   Patient was seen by consultant.   Consult note dictated.   Recommend to proceed with surgery or procedure.   Orders entered.   Discussed with Attending MD.   Comments: Her symptoms and ECG are concerning. Septal Q waves with borderline ST elevation (0.5 mm) with subtle ST depression in inferior leads. TnI is slightely elevated.  Recommend: Continue Aspirin. She was given 1 dose Lovenox.  I will start Heparin in am. Added Atrovastatin.  Recommend cardiac cath tomorrow.  Electronic Signatures: Kathlyn Sacramento (MD)  (Signed 23-Sep-13 17:15)  Authored: Brief Consult Note   Last Updated: 23-Sep-13 17:15 by Kathlyn Sacramento (MD)

## 2014-12-27 ENCOUNTER — Encounter: Payer: Self-pay | Admitting: Internal Medicine

## 2015-01-15 ENCOUNTER — Ambulatory Visit
Admission: RE | Admit: 2015-01-15 | Discharge: 2015-01-15 | Disposition: A | Discharge status: Home / Self Care | Payer: PPO | Source: Ambulatory Visit | Attending: Internal Medicine | Admitting: Internal Medicine

## 2015-01-15 DIAGNOSIS — Z1231 Encounter for screening mammogram for malignant neoplasm of breast: Secondary | ICD-10-CM | POA: Insufficient documentation

## 2015-01-15 DIAGNOSIS — Z1239 Encounter for other screening for malignant neoplasm of breast: Secondary | ICD-10-CM

## 2015-01-17 ENCOUNTER — Encounter: Payer: Self-pay | Admitting: Internal Medicine

## 2015-01-31 ENCOUNTER — Other Ambulatory Visit: Payer: Self-pay | Admitting: Internal Medicine

## 2015-01-31 NOTE — Telephone Encounter (Signed)
Pt called about medication, Mirtazapine 7.5 mg. Wants to send it Rx to [facility]; no longer has [facility]. Best contact # [phone] 01/31/15 maf

## 2015-02-01 MED ORDER — MIRTAZAPINE 7.5 MG PO TABS: 7.5000 mg | ORAL_TABLET | Freq: Every day | ORAL | Status: DC

## 2015-02-01 NOTE — Addendum Note (Signed)
Addended by: [name] on: 02/01/2015 05:20 PM   Modules accepted: Orders

## 2015-02-01 NOTE — Telephone Encounter (Signed)
Ok to refill mirtazapine  

## 2015-02-17 ENCOUNTER — Encounter: Payer: Self-pay | Admitting: Internal Medicine

## 2015-02-18 ENCOUNTER — Encounter: Payer: Self-pay | Admitting: Internal Medicine

## 2015-02-18 ENCOUNTER — Ambulatory Visit (INDEPENDENT_AMBULATORY_CARE_PROVIDER_SITE_OTHER): Payer: PPO | Admitting: Internal Medicine

## 2015-02-18 VITALS — BP 108/64 | HR 74 | Temp 97.9°F | Resp 14 | Ht 64.0 in | Wt 140.5 lb

## 2015-02-18 DIAGNOSIS — R300 Dysuria: Secondary | ICD-10-CM | POA: Diagnosis not present

## 2015-02-18 DIAGNOSIS — N3000 Acute cystitis without hematuria: Secondary | ICD-10-CM | POA: Diagnosis not present

## 2015-02-18 DIAGNOSIS — N3090 Cystitis, unspecified without hematuria: Secondary | ICD-10-CM | POA: Diagnosis not present

## 2015-02-18 DIAGNOSIS — R609 Edema, unspecified: Secondary | ICD-10-CM | POA: Diagnosis not present

## 2015-02-18 LAB — POCT URINALYSIS DIPSTICK
BILIRUBIN UA: NEGATIVE
Blood, UA: NEGATIVE
Glucose, UA: NEGATIVE
KETONES UA: NEGATIVE
Nitrite, UA: NEGATIVE
PH UA: 6
Protein, UA: NEGATIVE
SPEC GRAV UA: 1.01
Urobilinogen, UA: 0.2

## 2015-02-18 MED ORDER — CIPROFLOXACIN HCL 250 MG PO TABS: 250.0000 mg | ORAL_TABLET | Freq: Two times a day (BID) | ORAL | Status: DC

## 2015-02-18 NOTE — Patient Instructions (Signed)
I am treating your for a UTI with ciprofloxacin twice daily for 3 days.   Please take a probiotic ( Align, Floraque or Culturelle) of the generic version of one of these  For a minimum of 3 weeks to prevent a serious antibiotic associated diarrhea  Called clostridium dificile colitis   You can increase the fluid pill two twice daily IF needed,  But make sure you increase your water intake and elevated your legs as much as possible.   Limit the salt in your diet as much as possible

## 2015-02-18 NOTE — Progress Notes (Signed)
Subjective:  Patient ID: Kim Santiago, female    DOB: Sep 16, 1934  Age: 80 y.o. MRN: [id number]  CC: The primary encounter diagnosis was Acute cystitis without hematuria. Diagnoses of Dysuria, Edema, and Cystitis were also pertinent to this visit.  HPI Kim Santiago presents for evaluation nof dysuria.. Symptoms started   2 days ago with burning, pressure and frequency.    NO CVA pain,  No fevers or nausea.    Leg swelling worse in the summer    Using lasix 20 mg daily wants to know if she can increase dose to bid       Outpatient Prescriptions Prior to Visit  Medication Sig Dispense Refill  . acetaminophen (TYLENOL) 500 MG tablet Take 500 mg by mouth every 6 (six) hours as needed for pain.    Marland Kitchen ALPRAZolam (XANAX) 0.25 MG tablet Take 1 tablet (0.25 mg total) by mouth 2 (two) times daily as needed for anxiety. 30 tablet 1  . aspirin 81 MG tablet Take 81 mg by mouth daily.    . Calcium Carbonate-Vitamin D (CALCIUM + D) 600-200 MG-UNIT TABS Take 1 tablet by mouth daily.      . cholecalciferol (VITAMIN D) 1000 UNITS tablet Take 1,000 Units by mouth daily.      . fish oil-omega-3 fatty acids 1000 MG capsule Take 2 g by mouth daily.    . furosemide (LASIX) 20 MG tablet Once daily as needed for fluid retention 60 tablet 3  . levothyroxine (SYNTHROID, LEVOTHROID) 88 MCG tablet Take 1 tablet (88 mcg total) by mouth daily. 90 tablet 1  . losartan (COZAAR) 25 MG tablet Take 1 tablet (25 mg total) by mouth daily. 90 tablet 1  . metoprolol succinate (TOPROL XL) 25 MG 24 hr tablet Take 1 tablet (25 mg total) by mouth daily. 90 tablet 1  . mirtazapine (REMERON) 7.5 MG tablet Take 1 tablet (7.5 mg total) by mouth at bedtime. 90 tablet 1  . Multiple Vitamins-Minerals (OSTEO COMPLEX PO) Take one by mouth daily    . omeprazole (PRILOSEC) 20 MG capsule Take 1 capsule (20 mg total) by mouth daily. 90 capsule 1  . ondansetron (ZOFRAN-ODT) 4 MG disintegrating tablet Take 1 tablet (4 mg total) by mouth  every 8 (eight) hours as needed for nausea or vomiting. 20 tablet 0  . polyethylene glycol (MIRALAX / GLYCOLAX) packet Take 17 g by mouth daily.    . vitamin B-12 (CYANOCOBALAMIN) 1000 MCG tablet Take 1,000 mcg by mouth daily.    . pilocarpine (SALAGEN) 5 MG tablet Take by mouth 3 (three) times daily.     No facility-administered medications prior to visit.    Review of Systems;  Patient denies headache, fevers, malaise, unintentional weight loss, skin rash, eye pain, sinus congestion and sinus pain, sore throat, dysphagia,  hemoptysis , cough, dyspnea, wheezing, chest pain, palpitations, orthopnea, edema, abdominal pain, nausea, melena, diarrhea, constipation, flank pain, dysuria, hematuria, urinary  Frequency, nocturia, numbness, tingling, seizures,  Focal weakness, Loss of consciousness,  Tremor, insomnia, depression, anxiety, and suicidal ideation.      Objective:  BP 108/64 mmHg  Pulse 74  Temp(Src) 97.9 F (36.6 C) (Oral)  Resp 14  Ht 5\' 4"  (1.626 m)  Wt 140 lb 8 oz (63.73 kg)  BMI 24.10 kg/m2  SpO2 98%  BP Readings from Last 3 Encounters:  02/18/15 108/64  11/05/14 120/70  06/11/14 128/68    Wt Readings from Last 3 Encounters:  02/18/15 140 lb 8 oz (63.73  kg)  11/05/14 136 lb 8 oz (61.916 kg)  06/11/14 135 lb 8 oz (61.462 kg)    General appearance: alert, cooperative and appears stated age Ears: normal TM's and external ear canals both ears Throat: lips, mucosa, and tongue normal; teeth and gums normal Neck: no adenopathy, no carotid bruit, supple, symmetrical, trachea midline and thyroid not enlarged, symmetric, no tenderness/mass/nodules Back: symmetric, no curvature. ROM normal. No CVA tenderness. Lungs: clear to auscultation bilaterally Heart: regular rate and rhythm, S1, S2 normal, no murmur, click, rub or gallop Abdomen: soft, non-tender; bowel sounds normal; no masses,  no organomegaly Pulses: 2+ and symmetric Skin: Skin color, texture, turgor normal. No  rashes or lesions Lymph nodes: Cervical, supraclavicular, and axillary nodes normal.  No results found for: HGBA1C  Lab Results  Component Value Date   CREATININE 0.65 11/05/2014   CREATININE 0.7 03/06/2014   CREATININE 0.7 06/08/2013    Lab Results  Component Value Date   WBC 5.8 03/06/2014   HGB 12.3 03/06/2014   HCT 36.6 03/06/2014   PLT 211.0 03/06/2014   GLUCOSE 84 11/05/2014   CHOL 178 11/05/2014   TRIG 104.0 11/05/2014   HDL 44.50 11/05/2014   LDLDIRECT 117.6 12/27/2012   LDLCALC 113* 11/05/2014   ALT 5 11/05/2014   AST 16 11/05/2014   NA 136 11/05/2014   K 4.5 11/05/2014   CL 99 11/05/2014   CREATININE 0.65 11/05/2014   BUN 11 11/05/2014   CO2 32 11/05/2014   TSH 1.120 12/24/2014    Mm Digital Screening Bilateral  01/15/2015   CLINICAL DATA:  Screening.  EXAM: DIGITAL SCREENING BILATERAL MAMMOGRAM WITH CAD  COMPARISON:  Previous exam(s).  ACR Breast Density Category b: There are scattered areas of fibroglandular density.  FINDINGS: There are no findings suspicious for malignancy. Images were processed with CAD.  IMPRESSION: No mammographic evidence of malignancy. A result letter of this screening mammogram will be mailed directly to the patient.  RECOMMENDATION: Screening mammogram in one year. (Code:SM-B-01Y)  BI-RADS CATEGORY  1: Negative.   Electronically Signed   By: Conchita Paris M.D.   On: 01/15/2015 13:44    Assessment & Plan:   Problem List Items Addressed This Visit    Edema    Using prn furosemide. Advised to use compressions stockings., elevation of legs       Cystitis    Empiric treatment pending urine culture.        Other Visit Diagnoses    Acute cystitis without hematuria    -  Primary    Dysuria        Relevant Orders    POCT Urinalysis Dipstick (Completed)    Urine Culture    Urinalysis, Routine w reflex microscopic (Completed)       I am having Ms. Kim Santiago start on ciprofloxacin. I am also having her maintain her  cholecalciferol, Calcium Carbonate-Vitamin D, Multiple Vitamins-Minerals (OSTEO COMPLEX PO), vitamin B-12, aspirin, polyethylene glycol, fish oil-omega-3 fatty acids, acetaminophen, furosemide, pilocarpine, ALPRAZolam, ondansetron, levothyroxine, losartan, metoprolol succinate, omeprazole, and mirtazapine.  Meds ordered this encounter  Medications  . ciprofloxacin (CIPRO) 250 MG tablet    Sig: Take 1 tablet (250 mg total) by mouth 2 (two) times daily.    Dispense:  6 tablet    Refill:  0    There are no discontinued medications.  Follow-up: No Follow-up on file.   [name], MD

## 2015-02-18 NOTE — Progress Notes (Signed)
Pre-visit discussion using our clinic review tool. No additional management support is needed unless otherwise documented below in the visit note.  

## 2015-02-18 NOTE — Telephone Encounter (Signed)
Spoke to pt, appt scheduled with Dr. Derrel Nip today.

## 2015-02-19 LAB — URINALYSIS, ROUTINE W REFLEX MICROSCOPIC
BILIRUBIN URINE: NEGATIVE
Hgb urine dipstick: NEGATIVE
KETONES UR: NEGATIVE
Leukocytes, UA: NEGATIVE
Nitrite: NEGATIVE
SPECIFIC GRAVITY, URINE: 1.01 (ref 1.000–1.030)
Total Protein, Urine: NEGATIVE
Urine Glucose: NEGATIVE
Urobilinogen, UA: 0.2 (ref 0.0–1.0)
pH: 7 (ref 5.0–8.0)

## 2015-02-20 NOTE — Assessment & Plan Note (Addendum)
Using prn furosemide. Advised to use compressions stockings., elevation of legs

## 2015-02-20 NOTE — Assessment & Plan Note (Signed)
Empiric treatment pending urine culture.

## 2015-05-06 ENCOUNTER — Ambulatory Visit (INDEPENDENT_AMBULATORY_CARE_PROVIDER_SITE_OTHER): Payer: PPO | Admitting: Internal Medicine

## 2015-05-06 ENCOUNTER — Encounter: Payer: Self-pay | Admitting: Internal Medicine

## 2015-05-06 VITALS — BP 108/62 | HR 72 | Temp 97.9°F | Resp 14 | Ht 64.0 in | Wt 141.5 lb

## 2015-05-06 DIAGNOSIS — Z79899 Other long term (current) drug therapy: Secondary | ICD-10-CM

## 2015-05-06 DIAGNOSIS — I252 Old myocardial infarction: Secondary | ICD-10-CM

## 2015-05-06 DIAGNOSIS — L408 Other psoriasis: Secondary | ICD-10-CM | POA: Diagnosis not present

## 2015-05-06 DIAGNOSIS — L409 Psoriasis, unspecified: Secondary | ICD-10-CM

## 2015-05-06 DIAGNOSIS — F321 Major depressive disorder, single episode, moderate: Secondary | ICD-10-CM | POA: Diagnosis not present

## 2015-05-06 DIAGNOSIS — R609 Edema, unspecified: Secondary | ICD-10-CM | POA: Diagnosis not present

## 2015-05-06 DIAGNOSIS — Z23 Encounter for immunization: Secondary | ICD-10-CM

## 2015-05-06 DIAGNOSIS — E032 Hypothyroidism due to medicaments and other exogenous substances: Secondary | ICD-10-CM

## 2015-05-06 DIAGNOSIS — L259 Unspecified contact dermatitis, unspecified cause: Secondary | ICD-10-CM

## 2015-05-06 DIAGNOSIS — F5102 Adjustment insomnia: Secondary | ICD-10-CM

## 2015-05-06 LAB — COMPREHENSIVE METABOLIC PANEL
ALBUMIN: 4.1 g/dL (ref 3.5–5.2)
ALK PHOS: 80 U/L (ref 39–117)
ALT: 4 U/L (ref 0–35)
AST: 14 U/L (ref 0–37)
BUN: 13 mg/dL (ref 6–23)
CHLORIDE: 100 meq/L (ref 96–112)
CO2: 31 mEq/L (ref 19–32)
Calcium: 9.3 mg/dL (ref 8.4–10.5)
Creatinine, Ser: 0.73 mg/dL (ref 0.40–1.20)
GFR: 81.88 mL/min (ref >60.00)
GLUCOSE: 81 mg/dL (ref 70–99)
POTASSIUM: 4.8 meq/L (ref 3.5–5.1)
SODIUM: 139 meq/L (ref 135–145)
TOTAL PROTEIN: 7 g/dL (ref 6.0–8.3)
Total Bilirubin: 0.5 mg/dL (ref 0.2–1.2)

## 2015-05-06 MED ORDER — TRIAMCINOLONE ACETONIDE 0.1 % EX CREA: 1.0000 "application " | TOPICAL_CREAM | Freq: Two times a day (BID) | CUTANEOUS | Status: DC

## 2015-05-06 NOTE — Progress Notes (Signed)
Subjective:  Patient ID: Kim Santiago, female    DOB: [birth year]  Age: 80 y.o. MRN: [id number]  CC: The primary encounter diagnosis was Encounter for immunization. Diagnoses of Long-term use of high-risk medication, Depression, major, single episode, moderate, Psoriasis and similar disorder, Edema, Contact dermatitis and eczema, Iatrogenic hypothyroidism, Insomnia due to stress, and History of heart attack were also pertinent to this visit.  HPI [name] presents for  Follow up on multiple acute and chronic issues, including hypothyroidism. , depression, and heperlipidemia   She feels generally well and is tolerating her medications , but has developed a new rash on her anterior neck and upper chest.  The rash started at neckline last week,  ND DOES NOT ITCH.   Skins feels very dry .  Has Sjogren's ueses a spcial high flouide toothpaste.   Also has dry patch on right tragus that has been present for  a year.  Has not responded to OTC hydrocortisone,  Itchy   Recurrent Bilateral edema up to just above ankle .  Has been taking furosemide once daily 20 mg . History of venous insufficiencyl  She is not wearing her  compression stockings but not regularly due to the heat     Outpatient Prescriptions Prior to Visit  Medication Sig Dispense Refill  . acetaminophen (TYLENOL) 500 MG tablet Take 500 mg by mouth every 6 (six) hours as needed for pain.    Marland Kitchen aspirin 81 MG tablet Take 81 mg by mouth daily.    . Calcium Carbonate-Vitamin D (CALCIUM + D) 600-200 MG-UNIT TABS Take 1 tablet by mouth daily.      . cholecalciferol (VITAMIN D) 1000 UNITS tablet Take 1,000 Units by mouth daily.      . ciprofloxacin (CIPRO) 250 MG tablet Take 1 tablet (250 mg total) by mouth 2 (two) times daily. 6 tablet 0  . fish oil-omega-3 fatty acids 1000 MG capsule Take 2 g by mouth daily.    . furosemide (LASIX) 20 MG tablet Once daily as needed for fluid retention 60 tablet 3  . levothyroxine (SYNTHROID,  LEVOTHROID) 88 MCG tablet Take 1 tablet (88 mcg total) by mouth daily. 90 tablet 1  . losartan (COZAAR) 25 MG tablet Take 1 tablet (25 mg total) by mouth daily. 90 tablet 1  . metoprolol succinate (TOPROL XL) 25 MG 24 hr tablet Take 1 tablet (25 mg total) by mouth daily. 90 tablet 1  . mirtazapine (REMERON) 7.5 MG tablet Take 1 tablet (7.5 mg total) by mouth at bedtime. 90 tablet 1  . Multiple Vitamins-Minerals (OSTEO COMPLEX PO) Take one by mouth daily    . omeprazole (PRILOSEC) 20 MG capsule Take 1 capsule (20 mg total) by mouth daily. 90 capsule 1  . ondansetron (ZOFRAN-ODT) 4 MG disintegrating tablet Take 1 tablet (4 mg total) by mouth every 8 (eight) hours as needed for nausea or vomiting. 20 tablet 0  . polyethylene glycol (MIRALAX / GLYCOLAX) packet Take 17 g by mouth daily.    . vitamin B-12 (CYANOCOBALAMIN) 1000 MCG tablet Take 1,000 mcg by mouth daily.    Marland Kitchen ALPRAZolam (XANAX) 0.25 MG tablet Take 1 tablet (0.25 mg total) by mouth 2 (two) times daily as needed for anxiety. (Patient not taking: Reported on 05/06/2015) 30 tablet 1  . pilocarpine (SALAGEN) 5 MG tablet Take by mouth 3 (three) times daily.     No facility-administered medications prior to visit.    Review of Systems;  Patient denies headache, fevers,  malaise, unintentional weight loss, skin rash, eye pain, sinus congestion and sinus pain, sore throat, dysphagia,  hemoptysis , cough, dyspnea, wheezing, chest pain, palpitations, orthopnea, edema, abdominal pain, nausea, melena, diarrhea, constipation, flank pain, dysuria, hematuria, urinary  Frequency, nocturia, numbness, tingling, seizures,  Focal weakness, Loss of consciousness,  Tremor, insomnia, depression, anxiety, and suicidal ideation.      Objective:  BP 108/62 mmHg  Pulse 72  Temp(Src) 97.9 F (36.6 C) (Oral)  Resp 14  Ht 5\' 4"  (1.626 m)  Wt 141 lb 8 oz (64.184 kg)  BMI 24.28 kg/m2  SpO2 98%  BP Readings from Last 3 Encounters:  05/06/15 108/62  02/18/15  108/64  11/05/14 120/70    Wt Readings from Last 3 Encounters:  05/06/15 141 lb 8 oz (64.184 kg)  02/18/15 140 lb 8 oz (63.73 kg)  11/05/14 136 lb 8 oz (61.916 kg)    General appearance: alert, cooperative and appears stated age Ears: normal TM's and external ear canals both ears Throat: lips, mucosa, and tongue normal; teeth and gums normal Neck: no adenopathy, no carotid bruit, supple, symmetrical, trachea midline and thyroid not enlarged, symmetric, no tenderness/mass/nodules Back: symmetric, no curvature. ROM normal. No CVA tenderness. Lungs: clear to auscultation bilaterally Heart: regular rate and rhythm, S1, S2 normal, no murmur, click, rub or gallop Abdomen: soft, non-tender; bowel sounds normal; no masses,  no organomegaly Pulses: 2+ and symmetric Skin: Skin color, texture, turgor normal. No rashes or lesions Lymph nodes: Cervical, supraclavicular, and axillary nodes normal.  No results found for: HGBA1C  Lab Results  Component Value Date   CREATININE 0.73 05/06/2015   CREATININE 0.65 11/05/2014   CREATININE 0.7 03/06/2014    Lab Results  Component Value Date   WBC 5.8 03/06/2014   HGB 12.3 03/06/2014   HCT 36.6 03/06/2014   PLT 211.0 03/06/2014   GLUCOSE 81 05/06/2015   CHOL 178 11/05/2014   TRIG 104.0 11/05/2014   HDL 44.50 11/05/2014   LDLDIRECT 117.6 12/27/2012   LDLCALC 113* 11/05/2014   ALT 4 05/06/2015   AST 14 05/06/2015   NA 139 05/06/2015   K 4.8 05/06/2015   CL 100 05/06/2015   CREATININE 0.73 05/06/2015   BUN 13 05/06/2015   CO2 31 05/06/2015   TSH 1.120 12/24/2014    Mm Digital Screening Bilateral  01/15/2015   CLINICAL DATA:  Screening.  EXAM: DIGITAL SCREENING BILATERAL MAMMOGRAM WITH CAD  COMPARISON:  Previous exam(s).  ACR Breast Density Category b: There are scattered areas of fibroglandular density.  FINDINGS: There are no findings suspicious for malignancy. Images were processed with CAD.  IMPRESSION: No mammographic evidence of  malignancy. A result letter of this screening mammogram will be mailed directly to the patient.  RECOMMENDATION: Screening mammogram in one year. (Code:SM-B-01Y)  BI-RADS CATEGORY  1: Negative.   Electronically Signed   By: Conchita Paris M.D.   On: 01/15/2015 13:44    Assessment & Plan:   Problem List Items Addressed This Visit      Unprioritized   Iatrogenic hypothyroidism    Secondary to total thyroidectomy  for thyroid cancer, which occurred over 5 years ago,   goal TSH around  1.0  .  Lab Results  Component Value Date   TSH 1.120 12/24/2014           Edema    Medications reviewed,  Not taking NSAID.s  Advised to wear stockings and walk regularly,  Continue prn furosemide       Insomnia  due to stress    Managed with  0.5 alprazolam for insomnia and anxiety. The risks and benefits of benzodiazepine use were discussed with patient today including excessive sedation leading to respiratory depression,  impaired thinking/driving, and addiction.  Patient was advised to avoid concurrent use with alcohol, to use medication only as needed and not to share with others  .       Depression, major, single episode, moderate    Triggered by loss of husband.  Improved with remeron       History of heart attack    Normal cath 2013.  Continue metoprolol and losartan, she has declined statin therapy due to age Lab Results  Component Value Date   CHOL 178 11/05/2014   HDL 44.50 11/05/2014   LDLCALC 113* 11/05/2014   LDLDIRECT 117.6 12/27/2012   TRIG 104.0 11/05/2014   CHOLHDL 4 11/05/2014         Psoriasis and similar disorder    Right tragus,  Trial of triamcinolone       Contact dermatitis and eczema    Suggested appearance on neck,  Due to necklace. Triamcinolone trial       Other Visit Diagnoses    Encounter for immunization    -  Primary    Long-term use of high-risk medication        Relevant Orders    Comprehensive metabolic panel (Completed)      A total of 25  minutes of face to face time was spent with patient more than half of which was spent in counselling about the above mentioned conditions  and coordination of care  I have discontinued Ms. Bechler's pilocarpine. I am also having her start on triamcinolone cream. Additionally, I am having her maintain her cholecalciferol, Calcium Carbonate-Vitamin D, Multiple Vitamins-Minerals (OSTEO COMPLEX PO), vitamin B-12, aspirin, polyethylene glycol, fish oil-omega-3 fatty acids, acetaminophen, furosemide, ALPRAZolam, ondansetron, levothyroxine, losartan, metoprolol succinate, omeprazole, mirtazapine, and ciprofloxacin.  Meds ordered this encounter  Medications  . triamcinolone cream (KENALOG) 0.1 %    Sig: Apply 1 application topically 2 (two) times daily.    Dispense:  30 g    Refill:  0    Medications Discontinued During This Encounter  Medication Reason  . pilocarpine (SALAGEN) 5 MG tablet Patient Preference    Follow-up: No Follow-up on file.   [name], MD

## 2015-05-06 NOTE — Progress Notes (Signed)
Pre-visit discussion using our clinic review tool. No additional management support is needed unless otherwise documented below in the visit note.  

## 2015-05-06 NOTE — Patient Instructions (Signed)
Triamcinolone for psoriasis and rash on neck.   Horse chestnut seed may help with the leg swelling   Venous Stasis or Chronic Venous Insufficiency Chronic venous insufficiency, also called venous stasis, is a condition that affects the veins in the legs. The condition prevents blood from being pumped through these veins effectively. Blood may no longer be pumped effectively from the legs back to the heart. This condition can range from mild to severe. With proper treatment, you should be able to continue with an active life. CAUSES  Chronic venous insufficiency occurs when the vein walls become stretched, weakened, or damaged or when valves within the vein are damaged. Some common causes of this include:  High blood pressure inside the veins (venous hypertension).  Increased blood pressure in the leg veins from long periods of sitting or standing.  A blood clot that blocks blood flow in a vein (deep vein thrombosis).  Inflammation of a superficial vein (phlebitis) that causes a blood clot to form. RISK FACTORS Various things can make you more likely to develop chronic venous insufficiency, including:  Family history of this condition.  Obesity.  Pregnancy.  Sedentary lifestyle.  Smoking.  Jobs requiring long periods of standing or sitting in one place.  Being a certain age. Women in their 26s and 43s and men in their 65s are more likely to develop this condition. SIGNS AND SYMPTOMS  Symptoms may include:   Varicose veins.  Skin breakdown or ulcers.  Reddened or discolored skin on the leg.  Brown, smooth, tight, and painful skin just above the ankle, usually on the inside surface (lipodermatosclerosis).  Swelling. DIAGNOSIS  To diagnose this condition, your health care provider will take a medical history and do a physical exam. The following tests may be ordered to confirm the diagnosis:  Duplex ultrasound--A procedure that produces a picture of a blood vessel and  nearby organs and also provides information on blood flow through the blood vessel.  Plethysmography--A procedure that tests blood flow.  A venogram, or venography--A procedure used to look at the veins using X-ray and dye. TREATMENT The goals of treatment are to help you return to an active life and to minimize pain or disability. Treatment will depend on the severity of the condition. Medical procedures may be needed for severe cases. Treatment options may include:   Use of compression stockings. These can help with symptoms and lower the chances of the problem getting worse, but they do not cure the problem.  Sclerotherapy--A procedure involving an injection of a material that "dissolves" the damaged veins. Other veins in the network of blood vessels take over the function of the damaged veins.  Surgery to remove the vein or cut off blood flow through the vein (vein stripping or laser ablation surgery).  Surgery to repair a valve. HOME CARE INSTRUCTIONS   Wear compression stockings as directed by your health care provider.  Only take over-the-counter or prescription medicines for pain, discomfort, or fever as directed by your health care provider.  Follow up with your health care provider as directed. SEEK MEDICAL CARE IF:   You have redness, swelling, or increasing pain in the affected area.  You see a red streak or line that extends up or down from the affected area.  You have a breakdown or loss of skin in the affected area, even if the breakdown is small.  You have an injury to the affected area. SEEK IMMEDIATE MEDICAL CARE IF:   You have an injury and  open wound in the affected area.  Your pain is severe and does not improve with medicine.  You have sudden numbness or weakness in the foot or ankle below the affected area, or you have trouble moving your foot or ankle.  You have a fever or persistent symptoms for more than 2-3 days.  You have a fever and your symptoms  suddenly get worse. MAKE SURE YOU:   Understand these instructions.  Will watch your condition.  Will get help right away if you are not doing well or get worse. Document Released: 12/28/2006 Document Revised: 06/14/2013 Document Reviewed: 05/01/2013 [facility] Patient Information 2015 [place], [place]. This information is not intended to replace advice given to you by your health care provider. Make sure you discuss any questions you have with your health care provider.

## 2015-05-07 ENCOUNTER — Encounter: Payer: Self-pay | Admitting: Internal Medicine

## 2015-05-07 DIAGNOSIS — L259 Unspecified contact dermatitis, unspecified cause: Secondary | ICD-10-CM | POA: Insufficient documentation

## 2015-05-07 DIAGNOSIS — L409 Psoriasis, unspecified: Secondary | ICD-10-CM | POA: Insufficient documentation

## 2015-05-07 NOTE — Assessment & Plan Note (Signed)
Triggered by loss of husband.  Improved with remeron

## 2015-05-07 NOTE — Assessment & Plan Note (Signed)
Medications reviewed,  Not taking NSAID.s  Advised to wear stockings and walk regularly,  Continue prn furosemide

## 2015-05-07 NOTE — Assessment & Plan Note (Signed)
Normal cath 2013.  Continue metoprolol and losartan, she has declined statin therapy due to age Lab Results  Component Value Date   CHOL 178 11/05/2014   HDL 44.50 11/05/2014   LDLCALC 113* 11/05/2014   LDLDIRECT 117.6 12/27/2012   TRIG 104.0 11/05/2014   CHOLHDL 4 11/05/2014

## 2015-05-07 NOTE — Assessment & Plan Note (Signed)
Right tragus,  Trial of triamcinolone

## 2015-05-07 NOTE — Assessment & Plan Note (Addendum)
Secondary to total thyroidectomy  for thyroid cancer, which occurred over 5 years ago,   goal TSH around  1.0  .  Lab Results  Component Value Date   TSH 1.120 12/24/2014

## 2015-05-07 NOTE — Assessment & Plan Note (Signed)
Managed with  0.5 alprazolam for insomnia and anxiety. The risks and benefits of benzodiazepine use were discussed with patient today including excessive sedation leading to respiratory depression,  impaired thinking/driving, and addiction.  Patient was advised to avoid concurrent use with alcohol, to use medication only as needed and not to share with others  .

## 2015-05-07 NOTE — Assessment & Plan Note (Signed)
Suggested appearance on neck,  Due to necklace. Triamcinolone trial

## 2015-05-21 ENCOUNTER — Other Ambulatory Visit: Payer: Self-pay | Admitting: *Deleted

## 2015-05-21 DIAGNOSIS — E034 Atrophy of thyroid (acquired): Secondary | ICD-10-CM

## 2015-05-21 MED ORDER — LEVOTHYROXINE SODIUM 88 MCG PO TABS: 88.0000 ug | ORAL_TABLET | Freq: Every day | ORAL | Status: DC

## 2015-05-21 MED ORDER — OMEPRAZOLE 20 MG PO CPDR: 20.0000 mg | DELAYED_RELEASE_CAPSULE | Freq: Every day | ORAL | Status: DC

## 2015-05-21 MED ORDER — METOPROLOL SUCCINATE ER 25 MG PO TB24: 25.0000 mg | ORAL_TABLET | Freq: Every day | ORAL | Status: DC

## 2015-05-21 MED ORDER — LOSARTAN POTASSIUM 25 MG PO TABS: 25.0000 mg | ORAL_TABLET | Freq: Every day | ORAL | Status: DC

## 2015-05-23 ENCOUNTER — Other Ambulatory Visit: Payer: Self-pay | Admitting: Internal Medicine

## 2015-05-23 NOTE — Telephone Encounter (Signed)
Med requested refilled on 05/21/15

## 2015-07-08 ENCOUNTER — Encounter: Payer: Self-pay | Admitting: Internal Medicine

## 2015-07-08 ENCOUNTER — Ambulatory Visit (INDEPENDENT_AMBULATORY_CARE_PROVIDER_SITE_OTHER): Payer: PPO | Admitting: Internal Medicine

## 2015-07-08 VITALS — BP 138/64 | HR 87 | Temp 98.1°F | Resp 14 | Wt 141.6 lb

## 2015-07-08 DIAGNOSIS — N3090 Cystitis, unspecified without hematuria: Secondary | ICD-10-CM | POA: Diagnosis not present

## 2015-07-08 DIAGNOSIS — R300 Dysuria: Secondary | ICD-10-CM

## 2015-07-08 LAB — POCT URINALYSIS DIPSTICK
Bilirubin, UA: NEGATIVE
Glucose, UA: NEGATIVE
KETONES UA: NEGATIVE
Nitrite, UA: NEGATIVE
PH UA: 7
SPEC GRAV UA: 1.015
UROBILINOGEN UA: 0.2

## 2015-07-08 MED ORDER — CIPROFLOXACIN HCL 250 MG PO TABS: 250.0000 mg | ORAL_TABLET | Freq: Two times a day (BID) | ORAL | Status: DC

## 2015-07-08 NOTE — Progress Notes (Signed)
o  Subjective:  Patient ID: Kim Santiago, female    DOB: 03/09/1935  Age: 80 y.o. MRN: [id number]  CC: The primary encounter diagnosis was Dysuria. A diagnosis of Cystitis was also pertinent to this visit.  HPI [name] presents for urinary symptoms.  Burning  and frequency with suprapubic pressure for the last 3 days.  Recent trip to the beach , no swimming pool exposure,  2 bathroom breaks on the way home.  Sexually celebate since widowed.    Last episode was 3 months,empiric treatment with cipro last time. culture collected but never resulted.    History of Rash to sulfa.    Outpatient Prescriptions Prior to Visit  Medication Sig Dispense Refill  . acetaminophen (TYLENOL) 500 MG tablet Take 500 mg by mouth every 6 (six) hours as needed for pain.    Marland Kitchen aspirin 81 MG tablet Take 81 mg by mouth daily.    . Calcium Carbonate-Vitamin D (CALCIUM + D) 600-200 MG-UNIT TABS Take 1 tablet by mouth daily.      . cholecalciferol (VITAMIN D) 1000 UNITS tablet Take 1,000 Units by mouth daily.      . fish oil-omega-3 fatty acids 1000 MG capsule Take 2 g by mouth daily.    . furosemide (LASIX) 20 MG tablet Once daily as needed for fluid retention 60 tablet 3  . levothyroxine (SYNTHROID, LEVOTHROID) 88 MCG tablet Take 1 tablet (88 mcg total) by mouth daily. 90 tablet 4  . losartan (COZAAR) 25 MG tablet Take 1 tablet (25 mg total) by mouth daily. 90 tablet 4  . metoprolol succinate (TOPROL XL) 25 MG 24 hr tablet Take 1 tablet (25 mg total) by mouth daily. 90 tablet 4  . Multiple Vitamins-Minerals (OSTEO COMPLEX PO) Take one by mouth daily    . omeprazole (PRILOSEC) 20 MG capsule Take 1 capsule (20 mg total) by mouth daily. 90 capsule 4  . polyethylene glycol (MIRALAX / GLYCOLAX) packet Take 17 g by mouth daily.    Marland Kitchen triamcinolone cream (KENALOG) 0.1 % Apply 1 application topically 2 (two) times daily. 30 g 0  . vitamin B-12 (CYANOCOBALAMIN) 1000 MCG tablet Take 1,000 mcg by mouth daily.    Marland Kitchen  ALPRAZolam (XANAX) 0.25 MG tablet Take 1 tablet (0.25 mg total) by mouth 2 (two) times daily as needed for anxiety. (Patient not taking: Reported on 07/08/2015) 30 tablet 1  . ciprofloxacin (CIPRO) 250 MG tablet Take 1 tablet (250 mg total) by mouth 2 (two) times daily. (Patient not taking: Reported on 07/08/2015) 6 tablet 0  . mirtazapine (REMERON) 7.5 MG tablet Take 1 tablet (7.5 mg total) by mouth at bedtime. (Patient not taking: Reported on 07/08/2015) 90 tablet 1  . ondansetron (ZOFRAN-ODT) 4 MG disintegrating tablet Take 1 tablet (4 mg total) by mouth every 8 (eight) hours as needed for nausea or vomiting. (Patient not taking: Reported on 07/08/2015) 20 tablet 0   No facility-administered medications prior to visit.    Review of Systems;  Patient denies headache, fevers, malaise, unintentional weight loss, skin rash, eye pain, sinus congestion and sinus pain, sore throat, dysphagia,  hemoptysis , cough, dyspnea, wheezing, chest pain, palpitations, orthopnea, edema, abdominal pain, nausea, melena, diarrhea, constipation, flank pain, dysuria, hematuria, urinary  Frequency, nocturia, numbness, tingling, seizures,  Focal weakness, Loss of consciousness,  Tremor, insomnia, depression, anxiety, and suicidal ideation.      Objective:  BP 138/64 mmHg  Pulse 87  Temp(Src) 98.1 F (36.7 C) (Oral)  Resp 14  Wt 141 lb 9.6 oz (64.229 kg)  SpO2 99%  BP Readings from Last 3 Encounters:  07/08/15 138/64  05/06/15 108/62  02/18/15 108/64    Wt Readings from Last 3 Encounters:  07/08/15 141 lb 9.6 oz (64.229 kg)  05/06/15 141 lb 8 oz (64.184 kg)  02/18/15 140 lb 8 oz (63.73 kg)    General appearance: alert, cooperative and appears stated age Ears: normal TM's and external ear canals both ears Throat: lips, mucosa, and tongue normal; teeth and gums normal Neck: no adenopathy, no carotid bruit, supple, symmetrical, trachea midline and thyroid not enlarged, symmetric, no  tenderness/mass/nodules Back: symmetric, no curvature. ROM normal. No CVA tenderness. Lungs: clear to auscultation bilaterally Heart: regular rate and rhythm, S1, S2 normal, no murmur, click, rub or gallop Abdomen: soft, non-tender; bowel sounds normal; no masses,  no organomegaly Pulses: 2+ and symmetric Skin: Skin color, texture, turgor normal. No rashes or lesions Lymph nodes: Cervical, supraclavicular, and axillary nodes normal.  No results found for: HGBA1C  Lab Results  Component Value Date   CREATININE 0.73 05/06/2015   CREATININE 0.65 11/05/2014   CREATININE 0.7 03/06/2014    Lab Results  Component Value Date   WBC 5.8 03/06/2014   HGB 12.3 03/06/2014   HCT 36.6 03/06/2014   PLT 211.0 03/06/2014   GLUCOSE 81 05/06/2015   CHOL 178 11/05/2014   TRIG 104.0 11/05/2014   HDL 44.50 11/05/2014   LDLDIRECT 117.6 12/27/2012   LDLCALC 113* 11/05/2014   ALT 4 05/06/2015   AST 14 05/06/2015   NA 139 05/06/2015   K 4.8 05/06/2015   CL 100 05/06/2015   CREATININE 0.73 05/06/2015   BUN 13 05/06/2015   CO2 31 05/06/2015   TSH 1.120 12/24/2014    Mm Digital Screening Bilateral  01/15/2015  CLINICAL DATA:  Screening. EXAM: DIGITAL SCREENING BILATERAL MAMMOGRAM WITH CAD COMPARISON:  Previous exam(s). ACR Breast Density Category b: There are scattered areas of fibroglandular density. FINDINGS: There are no findings suspicious for malignancy. Images were processed with CAD. IMPRESSION: No mammographic evidence of malignancy. A result letter of this screening mammogram will be mailed directly to the patient. RECOMMENDATION: Screening mammogram in one year. (Code:SM-B-01Y) BI-RADS CATEGORY  1: Negative. Electronically Signed   By: Conchita Paris M.D.   On: 01/15/2015 13:44    Assessment & Plan:   Problem List Items Addressed This Visit    Cystitis    Empiric treatment with Cipro pending urine culture..       Other Visit Diagnoses    Dysuria    -  Primary    Relevant Orders      POCT Urinalysis Dipstick (Completed)    Urine Culture    Urinalysis, Routine w reflex microscopic (Completed)       I am having Ms. Culotta start on ciprofloxacin. I am also having her maintain her cholecalciferol, Calcium Carbonate-Vitamin D, Multiple Vitamins-Minerals (OSTEO COMPLEX PO), vitamin B-12, aspirin, polyethylene glycol, fish oil-omega-3 fatty acids, acetaminophen, furosemide, ALPRAZolam, ondansetron, mirtazapine, ciprofloxacin, triamcinolone cream, metoprolol succinate, omeprazole, losartan, and levothyroxine.  Meds ordered this encounter  Medications  . ciprofloxacin (CIPRO) 250 MG tablet    Sig: Take 1 tablet (250 mg total) by mouth 2 (two) times daily.    Dispense:  10 tablet    Refill:  0    There are no discontinued medications.  Follow-up: No Follow-up on file.   [name], MD

## 2015-07-09 ENCOUNTER — Encounter: Payer: Self-pay | Admitting: Internal Medicine

## 2015-07-09 LAB — URINALYSIS, ROUTINE W REFLEX MICROSCOPIC
Bilirubin Urine: NEGATIVE
Ketones, ur: NEGATIVE
Nitrite: NEGATIVE
TOTAL PROTEIN, URINE-UPE24: NEGATIVE
URINE GLUCOSE: NEGATIVE
UROBILINOGEN UA: 0.2 (ref 0.0–1.0)
pH: 7 (ref 5.0–8.0)

## 2015-07-09 NOTE — Assessment & Plan Note (Signed)
Empiric treatment with Cipro pending urine culture.Marland Kitchen

## 2015-07-12 LAB — URINE CULTURE: Colony Count: >=100000

## 2015-08-12 ENCOUNTER — Ambulatory Visit (INDEPENDENT_AMBULATORY_CARE_PROVIDER_SITE_OTHER): Payer: PPO

## 2015-08-12 VITALS — BP 128/68 | HR 75 | Temp 98.2°F | Resp 12 | Ht 63.5 in | Wt 140.8 lb

## 2015-08-12 DIAGNOSIS — Z0000 Encounter for general adult medical examination without abnormal findings: Secondary | ICD-10-CM | POA: Diagnosis not present

## 2015-08-12 NOTE — Progress Notes (Signed)
Subjective:   Kim Santiago is a 80 y.o. female who presents for Medicare Annual (Subsequent) preventive examination.  Review of Systems:  No ROS.  Medicare Wellness Visit. Cardiac Risk Factors include: advanced age (>44men, >96 women)     Objective:     Vitals: BP 128/68 mmHg  Pulse 75  Temp(Src) 98.2 F (36.8 C) (Oral)  Resp 12  Ht 5' 3.5" (1.613 m)  Wt 140 lb 12.8 oz (63.866 kg)  BMI 24.55 kg/m2  SpO2 99%  Tobacco History  Smoking status  . Never Smoker   Smokeless tobacco  . Never Used     Counseling given: Not Answered   Past Medical History  Diagnosis Date  . Menopause 40s    natural, hot flashes and mood lability now gone, off prempro 7 months  . hypothyroidism     secondary to thyroidectomy for thyroid ca  . Hypertension   . Myocardial infarction ([place]) 2013  . Stress-induced cardiomyopathy September of 2013    EF 35%. Peak troponin was 1.8.  . Cancer [facility])     thyroid takes levothyroxine  . Osteoporosis     Osteopenia   Past Surgical History  Procedure Laterality Date  . Kyphosis surgery  Feb 2008    L1, Dr. Mauri Pole  . Cholecystectomy    . Tonsillectomy    . Tubal ligation    . Lumbar disc surgery      L4-L5  . Shoulder arthroscopy  2004    Left, Dr. Jefm Bryant  . Thyroidectomy      Thyroid Cancer  . Spine surgery      L4-5 diskectomy  . Cardiac catheterization  05/2012    [facility]. No significant CAD. Ejection fraction of 35% due to stress-induced cardiomyopathy.   Family History  Problem Relation Age of Onset  . COPD Father   . Cancer Father     esophageal  . Kidney disease Mother   . Heart disease Mother   . Kidney disease Sister   . Cancer Brother 57    colon cancer (both brothers)   History  Sexual Activity  . Sexual Activity: No    Outpatient Encounter Prescriptions as of 08/12/2015  Medication Sig  . acetaminophen (TYLENOL) 500 MG tablet Take 500 mg by mouth every 6 (six) hours as needed for pain.  Marland Kitchen ALPRAZolam (XANAX)  0.25 MG tablet Take 1 tablet (0.25 mg total) by mouth 2 (two) times daily as needed for anxiety.  Marland Kitchen aspirin 81 MG tablet Take 81 mg by mouth daily.  . Calcium Carbonate-Vitamin D (CALCIUM + D) 600-200 MG-UNIT TABS Take 1 tablet by mouth daily.    . cholecalciferol (VITAMIN D) 1000 UNITS tablet Take 1,000 Units by mouth daily.    . ciprofloxacin (CIPRO) 250 MG tablet Take 1 tablet (250 mg total) by mouth 2 (two) times daily.  . fish oil-omega-3 fatty acids 1000 MG capsule Take 2 g by mouth daily.  . furosemide (LASIX) 20 MG tablet Once daily as needed for fluid retention  . levothyroxine (SYNTHROID, LEVOTHROID) 88 MCG tablet Take 1 tablet (88 mcg total) by mouth daily.  Marland Kitchen losartan (COZAAR) 25 MG tablet Take 1 tablet (25 mg total) by mouth daily.  . metoprolol succinate (TOPROL XL) 25 MG 24 hr tablet Take 1 tablet (25 mg total) by mouth daily.  . mirtazapine (REMERON) 7.5 MG tablet Take 1 tablet (7.5 mg total) by mouth at bedtime.  . Multiple Vitamins-Minerals (OSTEO COMPLEX PO) Take one by mouth daily  .  omeprazole (PRILOSEC) 20 MG capsule Take 1 capsule (20 mg total) by mouth daily.  . ondansetron (ZOFRAN-ODT) 4 MG disintegrating tablet Take 1 tablet (4 mg total) by mouth every 8 (eight) hours as needed for nausea or vomiting.  . polyethylene glycol (MIRALAX / GLYCOLAX) packet Take 17 g by mouth daily.  Marland Kitchen triamcinolone cream (KENALOG) 0.1 % Apply 1 application topically 2 (two) times daily.  . vitamin B-12 (CYANOCOBALAMIN) 1000 MCG tablet Take 1,000 mcg by mouth daily.  . [DISCONTINUED] ciprofloxacin (CIPRO) 250 MG tablet Take 1 tablet (250 mg total) by mouth 2 (two) times daily.   No facility-administered encounter medications on file as of 08/12/2015.    Activities of Daily Living In your present state of health, do you have any difficulty performing the following activities: 08/12/2015  Hearing? N  Vision? N  Difficulty concentrating or making decisions? N  Walking or climbing stairs? N    Dressing or bathing? N  Doing errands, shopping? N  Preparing Food and eating ? N  Using the Toilet? N  In the past six months, have you accidently leaked urine? N  Do you have problems with loss of bowel control? N  Managing your Medications? N  Managing your Finances? N  Housekeeping or managing your Housekeeping? N    Patient Care Team: [name], MD as PCP - General (Internal Medicine)    Assessment:    This is a routine wellness examination for Kim Santiago. The goal of the wellness visit is to assist the patient how to close the gaps in care and create a preventative care plan for the patient.   Calcium and Vit D as appropriate/ Osteoporosis risk reviewed.  Medications reviewed; taking without issues or barriers.  Safety issues reviewed; smoke detectors in the home. No firearms in the home. Wears seatbelts when driving or riding with others. No violence in the home.  No identified risk were noted; The patient was oriented x 3; appropriate in dress and manner and no objective failures at ADL's or IADL's.   Patient Concerns:  Accidental leakage from the bladder has increased, follow up appointment postponed per patient request.  Deferred to follow up with PCP at earliest convenience.  Advanced Directives/DNR and MOST.  Follow up appointment scheduled.  Malign neopl thyroid-stable and followed by Dr. Derrel Nip Malignant neoplasm of thyroid gland-stable and followed by Dr. Derrel Nip  Exercise Activities and Dietary recommendations Current Exercise Habits:: Home exercise routine, Type of exercise: walking, Time (Minutes): 20, Intensity: Mild  Goals    . Increase physical activity     Patient centered goal is to start going to the gym [place] program 2 times weekly for 1 hour aerobics class.    . Maintain healthy diet     Maintain healthy diet and drink plenty of water      Fall Risk Fall Risk  08/12/2015 11/06/2014 11/05/2014 05/06/2014 03/06/2014  Falls in the past year? No  Yes Yes Yes No  Number falls in past yr: - 1 2 or more - -  Injury with Fall? - Yes Yes - -  Risk for fall due to : - - Other (Comment) History of fall(s) -  Follow up - Education provided;Falls prevention discussed Falls prevention discussed - -   Depression Screen PHQ 2/9 Scores 11/06/2014 11/05/2014 05/06/2014 03/06/2014  PHQ - 2 Score 0 0 2 0  PHQ- 9 Score - - 6 -     Cognitive Testing MMSE - Mini Mental State Exam 08/12/2015  Orientation to time 5  Orientation to Place 5  Registration 3  Attention/ Calculation 5  Recall 3  Language- name 2 objects 2  Language- repeat 1  Language- follow 3 step command 3  Language- read & follow direction 1  Write a sentence 1  Copy design 1  Total score 30    Immunization History  Administered Date(s) Administered  . Influenza Split 07/08/2012  . Influenza,inj,Quad PF,36+ Mos 06/08/2013, 05/04/2014, 05/06/2015  . Pneumococcal Conjugate-13 03/06/2014  . Pneumococcal-Unspecified 10/11/2007  . Tdap 01/05/2013  . Zoster 01/12/2012   Screening Tests Health Maintenance  Topic Date Due  . INFLUENZA VACCINE  04/07/2016  . TETANUS/TDAP  01/06/2023  . DEXA SCAN  Completed  . ZOSTAVAX  Completed  . PNA vac Low Risk Adult  Completed      Plan:    End of life planning; Advance aging was discussed.She plans to share the information with daughter and return with a copy of HCPOA/Living form and discuss DNR and MOST at follow up visit. Time spent discussing [place] of Attorney/Living Will short form is 16 minutes.  Return in January for follow up MOST appointment.   During the course of the visit the patient was educated and counseled about the following appropriate screening and preventive services:   Vaccines to include Pneumoccal, Influenza, Hepatitis B, Td, Zostavax, HCV  Electrocardiogram  Cardiovascular Disease  Colorectal cancer screening  Bone density screening  Diabetes screening  Glaucoma  screening  Mammography/PAP  Nutrition counseling   Patient Instructions (the written plan) was given to the patient.   Varney Biles, LPN  [id number]

## 2015-08-12 NOTE — Progress Notes (Signed)
  I have reviewed the above information and agree with above.   Emerie Vanderkolk, MD 

## 2015-08-12 NOTE — Patient Instructions (Addendum)
Kim Santiago,  Thank you for taking time to come for your Medicare Wellness Visit.  I appreciate your ongoing commitment to your health goals. Please review the following plan we discussed and let me know if I can assist you in the future.  Follow up with PCP as needed  Return in January for follow up with MOST     Fat and Cholesterol Restricted Diet High levels of fat and cholesterol in your blood may lead to various health problems, such as diseases of the heart, blood vessels, gallbladder, liver, and pancreas. Fats are concentrated sources of energy that come in various forms. Certain types of fat, including saturated fat, may be harmful in excess. Cholesterol is a substance needed by your body in small amounts. Your body makes all the cholesterol it needs. Excess cholesterol comes from the food you eat. When you have high levels of cholesterol and saturated fat in your blood, health problems can develop because the excess fat and cholesterol will gather along the walls of your blood vessels, causing them to narrow. Choosing the right foods will help you control your intake of fat and cholesterol. This will help keep the levels of these substances in your blood within normal limits and reduce your risk of disease. WHAT IS MY PLAN? Your health care provider recommends that you:  Get no more than __________ % of the total calories in your daily diet from fat.  Limit your intake of saturated fat to less than ______% of your total calories each day.  Limit the amount of cholesterol in your diet to less than _________mg per day. WHAT TYPES OF FAT SHOULD I CHOOSE?  Choose healthy fats more often. Choose monounsaturated and polyunsaturated fats, such as olive and canola oil, flaxseeds, walnuts, almonds, and seeds.  Eat more omega-3 fats. Good choices include salmon, mackerel, sardines, tuna, flaxseed oil, and ground flaxseeds. Aim to eat fish at least two times a week.  Limit saturated fats.  Saturated fats are primarily found in animal products, such as meats, butter, and cream. Plant sources of saturated fats include palm oil, palm kernel oil, and coconut oil.  Avoid foods with partially hydrogenated oils in them. These contain trans fats. Examples of foods that contain trans fats are stick margarine, some tub margarines, cookies, crackers, and other baked goods. WHAT GENERAL GUIDELINES DO I NEED TO FOLLOW? These guidelines for healthy eating will help you control your intake of fat and cholesterol:  Check food labels carefully to identify foods with trans fats or high amounts of saturated fat.  Fill one half of your plate with vegetables and green salads.  Fill one fourth of your plate with whole grains. Look for the word "whole" as the first word in the ingredient list.  Fill one fourth of your plate with lean protein foods.  Limit fruit to two servings a day. Choose fruit instead of juice.  Eat more foods that contain soluble fiber. Examples of foods that contain this type of fiber are apples, broccoli, carrots, beans, peas, and barley. Aim to get 20-30 g of fiber per day.  Eat more home-cooked food and less restaurant, buffet, and fast food.  Limit or avoid alcohol.  Limit foods high in starch and sugar.  Limit fried foods.  Cook foods using methods other than frying. Baking, boiling, grilling, and broiling are all great options.  Lose weight if you are overweight. Losing just 5-10% of your initial body weight can help your overall health and prevent diseases  such as diabetes and heart disease. WHAT FOODS CAN I EAT? Grains Whole grains, such as whole wheat or whole grain breads, crackers, cereals, and pasta. Unsweetened oatmeal, bulgur, barley, quinoa, or brown rice. Corn or whole wheat flour tortillas. Vegetables Fresh or frozen vegetables (raw, steamed, roasted, or grilled). Green salads. Fruits All fresh, canned (in natural juice), or frozen fruits. Meat and  Other Protein Products Ground beef (85% or leaner), grass-fed beef, or beef trimmed of fat. Skinless chicken or [place]. Ground chicken or [place]. Pork trimmed of fat. All fish and seafood. Eggs. Dried beans, peas, or lentils. Unsalted nuts or seeds. Unsalted canned or dry beans. Dairy Low-fat dairy products, such as skim or 1% milk, 2% or reduced-fat cheeses, low-fat ricotta or cottage cheese, or plain low-fat yogurt. Fats and Oils Tub margarines without trans fats. Light or reduced-fat mayonnaise and salad dressings. Avocado. Olive, canola, sesame, or safflower oils. Natural peanut or almond butter (choose ones without added sugar and oil). The items listed above may not be a complete list of recommended foods or beverages. Contact your dietitian for more options. WHAT FOODS ARE NOT RECOMMENDED? Grains White bread. White pasta. White rice. Cornbread. Bagels, pastries, and croissants. Crackers that contain trans fat. Vegetables White potatoes. Corn. Creamed or fried vegetables. Vegetables in a cheese sauce. Fruits Dried fruits. Canned fruit in light or heavy syrup. Fruit juice. Meat and Other Protein Products Fatty cuts of meat. Ribs, chicken wings, bacon, sausage, bologna, salami, chitterlings, fatback, hot dogs, bratwurst, and packaged luncheon meats. Liver and organ meats. Dairy Whole or 2% milk, cream, half-and-half, and cream cheese. Whole milk cheeses. Whole-fat or sweetened yogurt. Full-fat cheeses. Nondairy creamers and whipped toppings. Processed cheese, cheese spreads, or cheese curds. Sweets and Desserts Corn syrup, sugars, honey, and molasses. Candy. Jam and jelly. Syrup. Sweetened cereals. Cookies, pies, cakes, donuts, muffins, and ice cream. Fats and Oils Butter, stick margarine, lard, shortening, ghee, or bacon fat. Coconut, palm kernel, or palm oils. Beverages Alcohol. Sweetened drinks (such as sodas, lemonade, and fruit drinks or punches). The items listed above may not be a  complete list of foods and beverages to avoid. Contact your dietitian for more information.   This information is not intended to replace advice given to you by your health care provider. Make sure you discuss any questions you have with your health care provider.   Document Released: 08/24/2005 Document Revised: 09/14/2014 Document Reviewed: 11/22/2013 Elsevier Interactive Patient Education 2016 [employer]. ++

## 2015-09-08 DIAGNOSIS — C50919 Malignant neoplasm of unspecified site of unspecified female breast: Secondary | ICD-10-CM

## 2015-09-08 HISTORY — DX: Malignant neoplasm of unspecified site of unspecified female breast: C50.919

## 2015-09-17 ENCOUNTER — Encounter: Payer: Self-pay | Admitting: Internal Medicine

## 2015-09-17 ENCOUNTER — Ambulatory Visit (INDEPENDENT_AMBULATORY_CARE_PROVIDER_SITE_OTHER): Payer: PPO | Admitting: Internal Medicine

## 2015-09-17 VITALS — BP 128/68 | HR 81 | Temp 97.9°F | Resp 12 | Ht 64.0 in | Wt 138.2 lb

## 2015-09-17 DIAGNOSIS — E559 Vitamin D deficiency, unspecified: Secondary | ICD-10-CM

## 2015-09-17 DIAGNOSIS — Z7189 Other specified counseling: Secondary | ICD-10-CM

## 2015-09-17 DIAGNOSIS — Z1159 Encounter for screening for other viral diseases: Secondary | ICD-10-CM | POA: Diagnosis not present

## 2015-09-17 DIAGNOSIS — R5383 Other fatigue: Secondary | ICD-10-CM

## 2015-09-17 DIAGNOSIS — E785 Hyperlipidemia, unspecified: Secondary | ICD-10-CM

## 2015-09-17 NOTE — Progress Notes (Signed)
Pre-visit discussion using our clinic review tool. No additional management support is needed unless otherwise documented below in the visit note.  

## 2015-09-17 NOTE — Progress Notes (Signed)
Subjective:  Patient ID: [name], female    DOB: April 03, 1935  Age: 81 y.o. MRN: [id number]  CC: The primary encounter diagnosis was Vitamin D deficiency. Diagnoses of Need for hepatitis C screening test, Hyperlipidemia, Other fatigue, and Advanced directives, counseling/discussion were also pertinent to this visit.  HPI [name] presents for follow up on designation of advance directives /MOST form discussed with Denisa on her recent wellness visit.   30 minutes was spent with patient discussing goals of care and designation of healthcare POA.  Advance directives, Living will, and MOST forms  Were completed.    Outpatient Prescriptions Prior to Visit  Medication Sig Dispense Refill  . acetaminophen (TYLENOL) 500 MG tablet Take 500 mg by mouth every 6 (six) hours as needed for pain.    Marland Kitchen aspirin 81 MG tablet Take 81 mg by mouth daily.    . Calcium Carbonate-Vitamin D (CALCIUM + D) 600-200 MG-UNIT TABS Take 1 tablet by mouth daily.      . cholecalciferol (VITAMIN D) 1000 UNITS tablet Take 1,000 Units by mouth daily.      . fish oil-omega-3 fatty acids 1000 MG capsule Take 2 g by mouth daily.    . furosemide (LASIX) 20 MG tablet Once daily as needed for fluid retention 60 tablet 3  . levothyroxine (SYNTHROID, LEVOTHROID) 88 MCG tablet Take 1 tablet (88 mcg total) by mouth daily. 90 tablet 4  . losartan (COZAAR) 25 MG tablet Take 1 tablet (25 mg total) by mouth daily. 90 tablet 4  . metoprolol succinate (TOPROL XL) 25 MG 24 hr tablet Take 1 tablet (25 mg total) by mouth daily. 90 tablet 4  . Multiple Vitamins-Minerals (OSTEO COMPLEX PO) Take one by mouth daily    . omeprazole (PRILOSEC) 20 MG capsule Take 1 capsule (20 mg total) by mouth daily. 90 capsule 4  . polyethylene glycol (MIRALAX / GLYCOLAX) packet Take 17 g by mouth daily.    Marland Kitchen triamcinolone cream (KENALOG) 0.1 % Apply 1 application topically 2 (two) times daily. 30 g 0  . vitamin B-12 (CYANOCOBALAMIN) 1000 MCG  tablet Take 1,000 mcg by mouth daily.    Marland Kitchen ALPRAZolam (XANAX) 0.25 MG tablet Take 1 tablet (0.25 mg total) by mouth 2 (two) times daily as needed for anxiety. (Patient not taking: Reported on 09/17/2015) 30 tablet 1  . ciprofloxacin (CIPRO) 250 MG tablet Take 1 tablet (250 mg total) by mouth 2 (two) times daily. 10 tablet 0  . mirtazapine (REMERON) 7.5 MG tablet Take 1 tablet (7.5 mg total) by mouth at bedtime. (Patient not taking: Reported on 09/17/2015) 90 tablet 1  . ondansetron (ZOFRAN-ODT) 4 MG disintegrating tablet Take 1 tablet (4 mg total) by mouth every 8 (eight) hours as needed for nausea or vomiting. 20 tablet 0   No facility-administered medications prior to visit.    Review of Systems;  Patient denies headache, fevers, malaise, unintentional weight loss, skin rash, eye pain, sinus congestion and sinus pain, sore throat, dysphagia,  hemoptysis , cough, dyspnea, wheezing, chest pain, palpitations, orthopnea, edema, abdominal pain, nausea, melena, diarrhea, constipation, flank pain, dysuria, hematuria, urinary  Frequency, nocturia, numbness, tingling, seizures,  Focal weakness, Loss of consciousness,  Tremor, insomnia, depression, anxiety, and suicidal ideation.      Objective:  BP 128/68 mmHg  Pulse 81  Temp(Src) 97.9 F (36.6 C) (Oral)  Resp 12  Ht 5\' 4"  (1.626 m)  Wt 138 lb 4 oz (62.71 kg)  BMI 23.72 kg/m2  SpO2  99%  BP Readings from Last 3 Encounters:  09/17/15 128/68  08/12/15 128/68  07/08/15 138/64    Wt Readings from Last 3 Encounters:  09/17/15 138 lb 4 oz (62.71 kg)  08/12/15 140 lb 12.8 oz (63.866 kg)  07/08/15 141 lb 9.6 oz (64.229 kg)    General appearance: alert, cooperative and appears stated age Psych: affect normal, makes good eye contact. No fidgeting,  Smiles easily.    No results found for: HGBA1C  Lab Results  Component Value Date   CREATININE 0.73 05/06/2015   CREATININE 0.65 11/05/2014   CREATININE 0.7 03/06/2014    Lab Results    Component Value Date   WBC 5.8 03/06/2014   HGB 12.3 03/06/2014   HCT 36.6 03/06/2014   PLT 211.0 03/06/2014   GLUCOSE 81 05/06/2015   CHOL 178 11/05/2014   TRIG 104.0 11/05/2014   HDL 44.50 11/05/2014   LDLDIRECT 117.6 12/27/2012   LDLCALC 113* 11/05/2014   ALT 4 05/06/2015   AST 14 05/06/2015   NA 139 05/06/2015   K 4.8 05/06/2015   CL 100 05/06/2015   CREATININE 0.73 05/06/2015   BUN 13 05/06/2015   CO2 31 05/06/2015   TSH 1.120 12/24/2014    Mm Digital Screening Bilateral  01/15/2015  CLINICAL DATA:  Screening. EXAM: DIGITAL SCREENING BILATERAL MAMMOGRAM WITH CAD COMPARISON:  Previous exam(s). ACR Breast Density Category b: There are scattered areas of fibroglandular density. FINDINGS: There are no findings suspicious for malignancy. Images were processed with CAD. IMPRESSION: No mammographic evidence of malignancy. A result letter of this screening mammogram will be mailed directly to the patient. RECOMMENDATION: Screening mammogram in one year. (Code:SM-B-01Y) BI-RADS CATEGORY  1: Negative. Electronically Signed   By: Conchita Paris M.D.   On: 01/15/2015 13:44    Assessment & Plan:   Problem List Items Addressed This Visit    Advanced directives, counseling/discussion    30 minutes was spent with patient discussing goals of care and designation of healthcare POA.  Advance directives, Living will, and MOST forms  Were completed.        Other Visit Diagnoses    Vitamin D deficiency    -  Primary    Relevant Orders    VITAMIN D 25 Hydroxy (Vit-D Deficiency, Fractures)    Need for hepatitis C screening test        Relevant Orders    Hepatitis C antibody    Hyperlipidemia        Relevant Orders    Lipid panel    Other fatigue        Relevant Orders    Comprehensive metabolic panel    CBC with Differential/Platelet    TSH       I have discontinued Ms. Rogness's ALPRAZolam, ondansetron, mirtazapine, and ciprofloxacin. I am also having her maintain her  cholecalciferol, Calcium Carbonate-Vitamin D, Multiple Vitamins-Minerals (OSTEO COMPLEX PO), vitamin B-12, aspirin, polyethylene glycol, fish oil-omega-3 fatty acids, acetaminophen, furosemide, triamcinolone cream, metoprolol succinate, omeprazole, losartan, and levothyroxine.  No orders of the defined types were placed in this encounter.    Medications Discontinued During This Encounter  Medication Reason  . ciprofloxacin (CIPRO) 250 MG tablet Completed Course  . ALPRAZolam (XANAX) 0.25 MG tablet Patient Preference  . ondansetron (ZOFRAN-ODT) 4 MG disintegrating tablet Patient Preference  . mirtazapine (REMERON) 7.5 MG tablet Patient Preference    Follow-up: Return in about 2 months (around 11/15/2015).   [name], MD

## 2015-09-17 NOTE — Patient Instructions (Signed)
Your next annual exam is due in March  Once you have the HCPOA notarized,  Keep a copy of it and the MOST form with you  And give the originals to your daughter

## 2015-09-18 DIAGNOSIS — Z0000 Encounter for general adult medical examination without abnormal findings: Secondary | ICD-10-CM | POA: Insufficient documentation

## 2015-09-18 DIAGNOSIS — Z7189 Other specified counseling: Secondary | ICD-10-CM | POA: Insufficient documentation

## 2015-09-18 NOTE — Assessment & Plan Note (Signed)
30 minutes was spent with patient discussing goals of care and designation of healthcare POA.  Advance directives, Living will, and MOST forms  Were completed.

## 2015-10-01 ENCOUNTER — Telehealth: Payer: Self-pay | Admitting: Internal Medicine

## 2015-10-01 NOTE — Telephone Encounter (Signed)
Pt called about feeling a hard small lump on left bottom end of nipple. Call pt @ [phone]. Thank you!

## 2015-10-01 NOTE — Telephone Encounter (Signed)
Please advise 

## 2015-10-02 NOTE — Telephone Encounter (Signed)
[id number]

## 2015-10-02 NOTE — Telephone Encounter (Signed)
Kim Santiago,  I will need to examine the patient

## 2015-10-02 NOTE — Telephone Encounter (Signed)
Pt called back and I scheduled her an appt for 1/27.Kim Santiago

## 2015-10-04 ENCOUNTER — Encounter: Payer: Self-pay | Admitting: Internal Medicine

## 2015-10-04 ENCOUNTER — Ambulatory Visit (INDEPENDENT_AMBULATORY_CARE_PROVIDER_SITE_OTHER): Payer: PPO | Admitting: Internal Medicine

## 2015-10-04 VITALS — BP 128/72 | HR 71 | Temp 98.0°F | Resp 12 | Ht 64.0 in | Wt 138.1 lb

## 2015-10-04 DIAGNOSIS — N61 Mastitis without abscess: Secondary | ICD-10-CM

## 2015-10-04 DIAGNOSIS — N6459 Other signs and symptoms in breast: Secondary | ICD-10-CM | POA: Insufficient documentation

## 2015-10-04 MED ORDER — DOXYCYCLINE HYCLATE 100 MG PO CAPS: 100.0000 mg | ORAL_CAPSULE | Freq: Two times a day (BID) | ORAL | Status: DC

## 2015-10-04 NOTE — Patient Instructions (Addendum)
Your left breast lump appears to be an infection on the nipple,  Which is why it is red and tender  I am prescribing doxycycline to take 2 times daily for teh next 7 days TAKE WITH FOOD TO AVOID NAUSEA  Please take a probiotic ( Align, Floraque or Culturelle), the generic version of one of these over the counter medications, or a PROBIOTIC BEVERAGE  (kombucha,  Dunlap ) Yogurt, or another dietary source) for a minimum of 3 weeks to prevent a serious antibiotic associated diarrhea  Called clostridium dificile colitis.  Taking a probiotic may also prevent vaginitis due to yeast infections and can be continued indefinitely if you feel that it improves your digestion or your elimination (bowels).     I WILL RECHECK YOU IN 2 WEEKS

## 2015-10-04 NOTE — Progress Notes (Signed)
Pre-visit discussion using our clinic review tool. No additional management support is needed unless otherwise documented below in the visit note.  

## 2015-10-04 NOTE — Progress Notes (Signed)
Subjective:  Patient ID: Kim Santiago, female    DOB: 1935-06-04  Age: 81 y.o. MRN: [id number]  CC: The primary encounter diagnosis was Mastitis, left, acute. A diagnosis of Mastitis in female was also pertinent to this visit.  HPI Kim Santiago presents for breast abnormality  On self exam .  She found a red tender spot on her left nipple several days ago .  The area has not drained or enlarged since it was first noted. No fevers.   Outpatient Prescriptions Prior to Visit  Medication Sig Dispense Refill  . acetaminophen (TYLENOL) 500 MG tablet Take 500 mg by mouth every 6 (six) hours as needed for pain.    Marland Kitchen aspirin 81 MG tablet Take 81 mg by mouth daily.    . Calcium Carbonate-Vitamin D (CALCIUM + D) 600-200 MG-UNIT TABS Take 1 tablet by mouth daily.      . cholecalciferol (VITAMIN D) 1000 UNITS tablet Take 1,000 Units by mouth daily.      . fish oil-omega-3 fatty acids 1000 MG capsule Take 2 g by mouth daily.    . furosemide (LASIX) 20 MG tablet Once daily as needed for fluid retention 60 tablet 3  . levothyroxine (SYNTHROID, LEVOTHROID) 88 MCG tablet Take 1 tablet (88 mcg total) by mouth daily. 90 tablet 4  . losartan (COZAAR) 25 MG tablet Take 1 tablet (25 mg total) by mouth daily. 90 tablet 4  . metoprolol succinate (TOPROL XL) 25 MG 24 hr tablet Take 1 tablet (25 mg total) by mouth daily. 90 tablet 4  . Multiple Vitamins-Minerals (OSTEO COMPLEX PO) Take one by mouth daily    . omeprazole (PRILOSEC) 20 MG capsule Take 1 capsule (20 mg total) by mouth daily. 90 capsule 4  . polyethylene glycol (MIRALAX / GLYCOLAX) packet Take 17 g by mouth daily.    Marland Kitchen triamcinolone cream (KENALOG) 0.1 % Apply 1 application topically 2 (two) times daily. 30 g 0  . vitamin B-12 (CYANOCOBALAMIN) 1000 MCG tablet Take 1,000 mcg by mouth daily.     No facility-administered medications prior to visit.    Review of Systems;  Patient denies headache, fevers, malaise, unintentional weight loss, skin  rash, eye pain, sinus congestion and sinus pain, sore throat, dysphagia,  hemoptysis , cough, dyspnea, wheezing, chest pain, palpitations, orthopnea, edema, abdominal pain, nausea, melena, diarrhea, constipation, flank pain, dysuria, hematuria, urinary  Frequency, nocturia, numbness, tingling, seizures,  Focal weakness, Loss of consciousness,  Tremor, insomnia, depression, anxiety, and suicidal ideation.      Objective:  BP 128/72 mmHg  Pulse 71  Temp(Src) 98 F (36.7 C) (Oral)  Resp 12  Ht 5\' 4"  (1.626 m)  Wt 138 lb 2 oz (62.653 kg)  BMI 23.70 kg/m2  SpO2 99%  BP Readings from Last 3 Encounters:  10/04/15 128/72  09/17/15 128/68  08/12/15 128/68    Wt Readings from Last 3 Encounters:  10/04/15 138 lb 2 oz (62.653 kg)  09/17/15 138 lb 4 oz (62.71 kg)  08/12/15 140 lb 12.8 oz (63.866 kg)    General Appearance:    Alert, cooperative, no distress, appears stated age        Breast Exam:    Small red tender papule left aureola                        .  No results found for: HGBA1C  Lab Results  Component Value Date   CREATININE 0.73 05/06/2015  CREATININE 0.65 11/05/2014   CREATININE 0.7 03/06/2014    Lab Results  Component Value Date   WBC 5.8 03/06/2014   HGB 12.3 03/06/2014   HCT 36.6 03/06/2014   PLT 211.0 03/06/2014   GLUCOSE 81 05/06/2015   CHOL 178 11/05/2014   TRIG 104.0 11/05/2014   HDL 44.50 11/05/2014   LDLDIRECT 117.6 12/27/2012   LDLCALC 113* 11/05/2014   ALT 4 05/06/2015   AST 14 05/06/2015   NA 139 05/06/2015   K 4.8 05/06/2015   CL 100 05/06/2015   CREATININE 0.73 05/06/2015   BUN 13 05/06/2015   CO2 31 05/06/2015   TSH 1.120 12/24/2014    Mm Digital Screening Bilateral  01/15/2015  CLINICAL DATA:  Screening. EXAM: DIGITAL SCREENING BILATERAL MAMMOGRAM WITH CAD COMPARISON:  Previous exam(s). ACR Breast Density Category b: There are scattered areas of fibroglandular density. FINDINGS: There are no findings suspicious for malignancy.  Images were processed with CAD. IMPRESSION: No mammographic evidence of malignancy. A result letter of this screening mammogram will be mailed directly to the patient. RECOMMENDATION: Screening mammogram in one year. (Code:SM-B-01Y) BI-RADS CATEGORY  1: Negative. Electronically Signed   By: Conchita Paris M.D.   On: 01/15/2015 13:44    Assessment & Plan:   Problem List Items Addressed This Visit    Mastitis, left, acute - Primary    Will treat with antibiotics and repeat exam in 2 weeks.       Mastitis in female      I am having [name] start on doxycycline. I am also having her maintain her cholecalciferol, Calcium Carbonate-Vitamin D, Multiple Vitamins-Minerals (OSTEO COMPLEX PO), vitamin B-12, aspirin, polyethylene glycol, fish oil-omega-3 fatty acids, acetaminophen, furosemide, triamcinolone cream, metoprolol succinate, omeprazole, losartan, and levothyroxine.  Meds ordered this encounter  Medications  . doxycycline (VIBRAMYCIN) 100 MG capsule    Sig: Take 1 capsule (100 mg total) by mouth 2 (two) times daily.    Dispense:  14 capsule    Refill:  0    There are no discontinued medications.  Follow-up: Return in about 2 weeks (around 10/18/2015) for RECHECK LEFT BREAST .   [name], MD

## 2015-10-06 NOTE — Assessment & Plan Note (Signed)
Will treat with antibiotics and repeat exam in 2 weeks.

## 2015-10-21 ENCOUNTER — Ambulatory Visit (INDEPENDENT_AMBULATORY_CARE_PROVIDER_SITE_OTHER): Payer: PPO | Admitting: Internal Medicine

## 2015-10-21 ENCOUNTER — Encounter: Payer: Self-pay | Admitting: Internal Medicine

## 2015-10-21 VITALS — BP 110/60 | HR 89 | Temp 97.8°F | Resp 12 | Ht 64.0 in | Wt 137.4 lb

## 2015-10-21 DIAGNOSIS — N6459 Other signs and symptoms in breast: Secondary | ICD-10-CM

## 2015-10-21 NOTE — Patient Instructions (Signed)
Please apply warm compresses to your nipple twice daily  Appointment with Dr. Job Founds for evaluation   Will be set up for you  Next week

## 2015-10-21 NOTE — Progress Notes (Signed)
Pre visit review using our clinic review tool, if applicable. No additional management support is needed unless otherwise documented below in the visit note. 

## 2015-10-21 NOTE — Progress Notes (Signed)
Subjective:  Patient ID: Kim Santiago, female    DOB: March 23, 1935  Age: 81 y.o. MRN: [id number]  CC: The encounter diagnosis was Nipple symptom or sign in female.  HPI [name] presents for follow up on left nipple changes.  She was seen initially one month ago,  Left nipple was tender and had a thickened area that spanned the lower half of the areola from 7:00 to 4:00 position   .  Was treated for mastitis, with  antibioitc.   no change.  Has not had any  diarrhea from the antibiotics.  Last mammogram normal May 2016   Has sjogren's ,  Constipated all the time   Ultrasound of breast advised.  She has a lot of appointments and company this week.  Has availability next Tuesday and next thursday .   Outpatient Prescriptions Prior to Visit  Medication Sig Dispense Refill  . acetaminophen (TYLENOL) 500 MG tablet Take 500 mg by mouth every 6 (six) hours as needed for pain.    Marland Kitchen aspirin 81 MG tablet Take 81 mg by mouth daily.    . Calcium Carbonate-Vitamin D (CALCIUM + D) 600-200 MG-UNIT TABS Take 1 tablet by mouth daily.      . cholecalciferol (VITAMIN D) 1000 UNITS tablet Take 1,000 Units by mouth daily.      Marland Kitchen doxycycline (VIBRAMYCIN) 100 MG capsule Take 1 capsule (100 mg total) by mouth 2 (two) times daily. 14 capsule 0  . fish oil-omega-3 fatty acids 1000 MG capsule Take 2 g by mouth daily.    . furosemide (LASIX) 20 MG tablet Once daily as needed for fluid retention 60 tablet 3  . levothyroxine (SYNTHROID, LEVOTHROID) 88 MCG tablet Take 1 tablet (88 mcg total) by mouth daily. 90 tablet 4  . losartan (COZAAR) 25 MG tablet Take 1 tablet (25 mg total) by mouth daily. 90 tablet 4  . metoprolol succinate (TOPROL XL) 25 MG 24 hr tablet Take 1 tablet (25 mg total) by mouth daily. 90 tablet 4  . Multiple Vitamins-Minerals (OSTEO COMPLEX PO) Take one by mouth daily    . omeprazole (PRILOSEC) 20 MG capsule Take 1 capsule (20 mg total) by mouth daily. 90 capsule 4  . polyethylene glycol  (MIRALAX / GLYCOLAX) packet Take 17 g by mouth daily.    Marland Kitchen triamcinolone cream (KENALOG) 0.1 % Apply 1 application topically 2 (two) times daily. 30 g 0  . vitamin B-12 (CYANOCOBALAMIN) 1000 MCG tablet Take 1,000 mcg by mouth daily.     No facility-administered medications prior to visit.    Review of Systems;  Patient denies headache, fevers, malaise, unintentional weight loss, skin rash, eye pain, sinus congestion and sinus pain, sore throat, dysphagia,  hemoptysis , cough, dyspnea, wheezing, chest pain, palpitations, orthopnea, edema, abdominal pain, nausea, melena, diarrhea, constipation, flank pain, dysuria, hematuria, urinary  Frequency, nocturia, numbness, tingling, seizures,  Focal weakness, Loss of consciousness,  Tremor, insomnia, depression, anxiety, and suicidal ideation.      Objective:  BP 110/60 mmHg  Pulse 89  Temp(Src) 97.8 F (36.6 C) (Oral)  Resp 12  Ht 5\' 4"  (1.626 m)  Wt 137 lb 6.4 oz (62.324 kg)  BMI 23.57 kg/m2  SpO2 99%  BP Readings from Last 3 Encounters:  10/21/15 110/60  10/04/15 128/72  09/17/15 128/68    Wt Readings from Last 3 Encounters:  10/21/15 137 lb 6.4 oz (62.324 kg)  10/04/15 138 lb 2 oz (62.653 kg)  09/17/15 138 lb 4 oz (  62.71 kg)    General appearance: alert, cooperative and appears stated age Head: Normocephalic, without obvious abnormality, atraumatic Neck: no adenopathy, no carotid bruit, no JVD, supple, symmetrical, trachea midline and thyroid not enlarged, symmetric, no tenderness/mass/nodules Lungs: clear to auscultation bilaterally Breasts: left nipple with thickened area inferiorly from 4:00 to 7:00 position, slightly tender no discharge Heart: regular rate and rhythm, S1, S2 normal, no murmur, click, rub or gallop Neurologic: Alert and oriented X 3, normal strength and tone. Normal symmetric reflexes. Normal coordination and gait.  No results found for: HGBA1C  Lab Results  Component Value Date   CREATININE 0.73  05/06/2015   CREATININE 0.65 11/05/2014   CREATININE 0.7 03/06/2014    Lab Results  Component Value Date   WBC 5.8 03/06/2014   HGB 12.3 03/06/2014   HCT 36.6 03/06/2014   PLT 211.0 03/06/2014   GLUCOSE 81 05/06/2015   CHOL 178 11/05/2014   TRIG 104.0 11/05/2014   HDL 44.50 11/05/2014   LDLDIRECT 117.6 12/27/2012   LDLCALC 113* 11/05/2014   ALT 4 05/06/2015   AST 14 05/06/2015   NA 139 05/06/2015   K 4.8 05/06/2015   CL 100 05/06/2015   CREATININE 0.73 05/06/2015   BUN 13 05/06/2015   CO2 31 05/06/2015   TSH 1.120 12/24/2014    Mm Digital Screening Bilateral  01/15/2015  CLINICAL DATA:  Screening. EXAM: DIGITAL SCREENING BILATERAL MAMMOGRAM WITH CAD COMPARISON:  Previous exam(s). ACR Breast Density Category b: There are scattered areas of fibroglandular density. FINDINGS: There are no findings suspicious for malignancy. Images were processed with CAD. IMPRESSION: No mammographic evidence of malignancy. A result letter of this screening mammogram will be mailed directly to the patient. RECOMMENDATION: Screening mammogram in one year. (Code:SM-B-01Y) BI-RADS CATEGORY  1: Negative. Electronically Signed   By: Conchita Paris M.D.   On: 01/15/2015 13:44    Assessment & Plan:   Problem List Items Addressed This Visit    Nipple symptom or sign in female - Primary    Are of left nipple remains thickened and slightly tender depsite empiric treatment for mastitis.  Further evaluation advised with referral to Dr Bary Castilla for ultrasound .  Last mammogram normal May 2016      Relevant Orders   Ambulatory referral to General Surgery      I am having Ms. Roca maintain her cholecalciferol, Calcium Carbonate-Vitamin D, Multiple Vitamins-Minerals (OSTEO COMPLEX PO), vitamin B-12, aspirin, polyethylene glycol, fish oil-omega-3 fatty acids, acetaminophen, furosemide, triamcinolone cream, metoprolol succinate, omeprazole, losartan, levothyroxine, and doxycycline.  No orders of the defined  types were placed in this encounter.    There are no discontinued medications.  Follow-up: No Follow-up on file.   [name], MD

## 2015-10-22 ENCOUNTER — Encounter: Payer: Self-pay | Admitting: *Deleted

## 2015-10-22 NOTE — Assessment & Plan Note (Signed)
Are of left nipple remains thickened and slightly tender depsite empiric treatment for mastitis.  Further evaluation advised with referral to Dr Bary Castilla for ultrasound .  Last mammogram normal May 2016

## 2015-10-30 ENCOUNTER — Ambulatory Visit (INDEPENDENT_AMBULATORY_CARE_PROVIDER_SITE_OTHER): Payer: PPO | Admitting: General Surgery

## 2015-10-30 ENCOUNTER — Encounter: Payer: Self-pay | Admitting: General Surgery

## 2015-10-30 VITALS — BP 140/78 | HR 80 | Resp 12 | Ht 64.0 in | Wt 137.0 lb

## 2015-10-30 DIAGNOSIS — N63 Unspecified lump in breast: Secondary | ICD-10-CM

## 2015-10-30 DIAGNOSIS — N6320 Unspecified lump in the left breast, unspecified quadrant: Secondary | ICD-10-CM

## 2015-10-30 DIAGNOSIS — C44501 Unspecified malignant neoplasm of skin of breast: Secondary | ICD-10-CM | POA: Diagnosis not present

## 2015-10-30 HISTORY — PX: BREAST BIOPSY: SHX20

## 2015-10-30 NOTE — Patient Instructions (Signed)
The patient is aware to call back for any questions or concerns.  

## 2015-10-30 NOTE — Progress Notes (Signed)
Patient ID: Kim Santiago, female   DOB: Sep 10, 1934, 81 y.o.   MRN: [id number]  Chief Complaint  Patient presents with  . Breast Problem    HPI Kim Santiago is a 81 y.o. female.  who presents for a breast evaluation, left nipple mass. She noticed the area in January while doing a random breast exam. She states it feels like a hard flat knot. Denies redness or drainage. She was treated with an antibiotic and states the area is smaller. The most recent mammogram was done in May 2016. Patient does not perform regular self breast checks and gets regular mammograms done.   She was widowed about 18 months ago after a 11 year marriage. Native of [place], [place]. She has an adopted daughter and 2 grandchildren who live locally.   HPI  Past Medical History  Diagnosis Date  . Menopause 40s    natural, hot flashes and mood lability now gone, off prempro 7 months  . hypothyroidism     secondary to thyroidectomy for thyroid ca  . Hypertension   . Myocardial infarction ([place]) 2013  . Stress-induced cardiomyopathy September of 2013    EF 35%. Peak troponin was 1.8.  . Cancer [facility])     thyroid takes levothyroxine  . Osteoporosis     Osteopenia  . Sjoegren syndrome ([place])   . Rosacea     Past Surgical History  Procedure Laterality Date  . Kyphosis surgery  Feb 2008    L1, Dr. Mauri Pole  . Cholecystectomy    . Tonsillectomy    . Tubal ligation    . Lumbar disc surgery      L4-L5  . Shoulder arthroscopy  2004    Left, Dr. Jefm Bryant  . Thyroidectomy      Thyroid Cancer  . Spine surgery      L4-5 diskectomy  . Cardiac catheterization  05/2012    [facility]. No significant CAD. Ejection fraction of 35% due to stress-induced cardiomyopathy.    Family History  Problem Relation Age of Onset  . COPD Father   . Cancer Father     esophageal  . Kidney disease Mother   . Heart disease Mother   . Kidney disease Sister   . Cancer Brother 64    colon cancer (both brothers)    Social  History Social History  Substance Use Topics  . Smoking status: Never Smoker   . Smokeless tobacco: Never Used  . Alcohol Use: No    Allergies  Allergen Reactions  . Amoxicillin Rash  . Codeine   . Naprosyn [Naproxen] Swelling  . Orudis [Ketoprofen]   . Sulfathiazole Rash    Current Outpatient Prescriptions  Medication Sig Dispense Refill  . acetaminophen (TYLENOL) 500 MG tablet Take 500 mg by mouth every 6 (six) hours as needed for pain.    Marland Kitchen aspirin 81 MG tablet Take 81 mg by mouth daily.    . Calcium Carbonate-Vitamin D (CALCIUM + D) 600-200 MG-UNIT TABS Take 1 tablet by mouth daily.      . cholecalciferol (VITAMIN D) 1000 UNITS tablet Take 1,000 Units by mouth daily.      . fish oil-omega-3 fatty acids 1000 MG capsule Take 2 g by mouth daily.    . furosemide (LASIX) 20 MG tablet Once daily as needed for fluid retention 60 tablet 3  . levothyroxine (SYNTHROID, LEVOTHROID) 88 MCG tablet Take 1 tablet (88 mcg total) by mouth daily. 90 tablet 4  . losartan (COZAAR) 25 MG tablet Take  1 tablet (25 mg total) by mouth daily. 90 tablet 4  . metoprolol succinate (TOPROL XL) 25 MG 24 hr tablet Take 1 tablet (25 mg total) by mouth daily. 90 tablet 4  . Multiple Vitamins-Minerals (OSTEO COMPLEX PO) Take one by mouth daily    . omeprazole (PRILOSEC) 20 MG capsule Take 1 capsule (20 mg total) by mouth daily. 90 capsule 4  . polyethylene glycol (MIRALAX / GLYCOLAX) packet Take 17 g by mouth daily.    Marland Kitchen triamcinolone cream (KENALOG) 0.1 % Apply 1 application topically 2 (two) times daily. 30 g 0  . vitamin B-12 (CYANOCOBALAMIN) 1000 MCG tablet Take 1,000 mcg by mouth daily.     No current facility-administered medications for this visit.    Review of Systems Review of Systems  Constitutional: Negative.   Respiratory: Negative.   Cardiovascular: Negative.     Blood pressure 140/78, pulse 80, resp. rate 12, height 5\' 4"  (1.626 m), weight 137 lb (62.143 kg).  Physical Exam Physical  Exam  Constitutional: She is oriented to person, place, and time. She appears well-developed and well-nourished.  HENT:  Mouth/Throat: Oropharynx is clear and moist.  Eyes: Conjunctivae are normal. No scleral icterus.  Neck: Neck supple.  Cardiovascular: Normal rate, regular rhythm and normal heart sounds.   Pulmonary/Chest: Effort normal and breath sounds normal. Right breast exhibits no inverted nipple, no mass, no nipple discharge, no skin change and no tenderness. Left breast exhibits no inverted nipple, no mass, no nipple discharge, no skin change and no tenderness.    No erythema or induration. No tenderness. No lymphangitic streaking.  Lymphadenopathy:    She has no cervical adenopathy.    She has no axillary adenopathy.  Neurological: She is alert and oriented to person, place, and time.  Skin: Skin is warm and dry.  Psychiatric: Her behavior is normal.    Data Reviewed Bilateral screening mammograms from 2015 and 2016 were reviewed. Stable thickening of the retroareolar skin, unchanged during those 2 exams. No underlying mass lesions noted in the retroareolar space on the left.  Assessment    1.5 cm mass involving the left areola    Plan    This area likely is a dermal rather than breast process. The appearance is not that of Paget's disease. Biopsy was recommended and accepted. 10 mL of 0.5% Xylocaine with 0.25% Marcaine with 1-200,000 of epinephrine was utilized well tolerated. ChloraPrep was applied to the skin. A single 2.5 mm punch biopsy was obtained and sent for pathology. The skin defect was approximated with a single 4-0 nylon suture. Telfa and Tegaderm dressing applied.  Postbiopsy wound care reviewed.  The patient will return in one week for suture removal and will be contacted when pathology is available.     PCP:  Deborra Medina  This information has been scribed by Karie Fetch Franklin.   Robert Bellow 10/30/2015, 5:34 PM

## 2015-11-04 ENCOUNTER — Other Ambulatory Visit: Payer: Self-pay | Admitting: *Deleted

## 2015-11-04 ENCOUNTER — Telehealth: Payer: Self-pay | Admitting: *Deleted

## 2015-11-04 DIAGNOSIS — C50912 Malignant neoplasm of unspecified site of left female breast: Secondary | ICD-10-CM

## 2015-11-04 NOTE — Telephone Encounter (Signed)
Per Roselyn Reef at [facility], the patient would only be due for a unilateral left diagnostic mammogram. If she has a bilateral diagnositic mammogram prior to April the patient would most likely be responsible for cost as insurance would most likely deny since last screening was done in May 2016.   Please clarify what you want me to order for the patient.

## 2015-11-04 NOTE — Telephone Encounter (Signed)
Per Dr. Bary Castilla, order a unilateral left diagnostic mammogram at [facility] if they accept patient's insurance.

## 2015-11-04 NOTE — Telephone Encounter (Signed)
-----   Message from Robert Bellow, MD sent at 11/04/2015  1:06 PM EST ----- The patient had a nurse visit scheduled for February 28. Please mark it down that she will be coming in to see me at 4:30 PM. Make arrangements for her have a diagnostic mammogram only sometime the end of this week or early next week. No ultrasound just a mammogram. Thank you ----- Message -----    From: [facility]    Sent: 11/01/2015   3:05 PM      To: Robert Bellow, MD

## 2015-11-05 ENCOUNTER — Encounter: Payer: Self-pay | Admitting: General Surgery

## 2015-11-05 ENCOUNTER — Ambulatory Visit (INDEPENDENT_AMBULATORY_CARE_PROVIDER_SITE_OTHER): Payer: PPO | Admitting: General Surgery

## 2015-11-05 VITALS — BP 124/76 | HR 76 | Resp 12 | Ht 64.0 in | Wt 137.0 lb

## 2015-11-05 DIAGNOSIS — C50912 Malignant neoplasm of unspecified site of left female breast: Secondary | ICD-10-CM

## 2015-11-05 DIAGNOSIS — N6320 Unspecified lump in the left breast, unspecified quadrant: Secondary | ICD-10-CM

## 2015-11-05 DIAGNOSIS — N63 Unspecified lump in breast: Secondary | ICD-10-CM | POA: Diagnosis not present

## 2015-11-05 NOTE — Progress Notes (Signed)
Patient ID: Kim Santiago, female   DOB: [birth year], 81 y.o.   MRN: [id number]  Chief Complaint  Patient presents with  . Routine Post Op    left areolar mass    HPI Kim Santiago is a 81 y.o. female here today to discuss left areolar excision done on 10/30/15. The patient is accompanied today by her daughter, Kim Santiago. The patient had previously been notified by phone of her biopsy results.     I personally reviewed the patient's history                     HPI  Past Medical History  Diagnosis Date  . Menopause 40s    natural, hot flashes and mood lability now gone, off prempro 7 months  . hypothyroidism     secondary to thyroidectomy for thyroid ca  . Hypertension   . Myocardial infarction ([place]) 2013  . Stress-induced cardiomyopathy September of 2013    EF 35%. Peak troponin was 1.8.  . Cancer [facility])     thyroid takes levothyroxine  . Osteoporosis     Osteopenia  . Sjoegren syndrome ([place])   . Rosacea     Past Surgical History  Procedure Laterality Date  . Kyphosis surgery  Feb 2008    L1, Dr. Mauri Pole  . Cholecystectomy    . Tonsillectomy    . Tubal ligation    . Lumbar disc surgery      L4-L5  . Shoulder arthroscopy  2004    Left, Dr. Jefm Bryant  . Thyroidectomy      Thyroid Cancer  . Spine surgery      L4-5 diskectomy  . Cardiac catheterization  05/2012    [facility]. No significant CAD. Ejection fraction of 35% due to stress-induced cardiomyopathy.    Family History  Problem Relation Age of Onset  . COPD Father   . Cancer Father     esophageal  . Kidney disease Mother   . Heart disease Mother   . Kidney disease Sister   . Cancer Brother 73    colon cancer (both brothers)    Social History Social History  Substance Use Topics  . Smoking status: Never Smoker   . Smokeless tobacco: Never Used  . Alcohol Use: No    Allergies  Allergen Reactions  . Amoxicillin Rash  . Codeine   . Naprosyn [Naproxen] Swelling  . Orudis [Ketoprofen]   .  Sulfathiazole Rash    Current Outpatient Prescriptions  Medication Sig Dispense Refill  . acetaminophen (TYLENOL) 500 MG tablet Take 500 mg by mouth every 6 (six) hours as needed for pain.    Marland Kitchen aspirin 81 MG tablet Take 81 mg by mouth daily.    . Calcium Carbonate-Vitamin D (CALCIUM + D) 600-200 MG-UNIT TABS Take 1 tablet by mouth daily.      . cholecalciferol (VITAMIN D) 1000 UNITS tablet Take 1,000 Units by mouth daily.      . fish oil-omega-3 fatty acids 1000 MG capsule Take 2 g by mouth daily.    . furosemide (LASIX) 20 MG tablet Once daily as needed for fluid retention 60 tablet 3  . levothyroxine (SYNTHROID, LEVOTHROID) 88 MCG tablet Take 1 tablet (88 mcg total) by mouth daily. 90 tablet 4  . losartan (COZAAR) 25 MG tablet Take 1 tablet (25 mg total) by mouth daily. 90 tablet 4  . metoprolol succinate (TOPROL XL) 25 MG 24 hr tablet Take 1 tablet (25 mg total) by mouth daily.  90 tablet 4  . Multiple Vitamins-Minerals (OSTEO COMPLEX PO) Take one by mouth daily    . omeprazole (PRILOSEC) 20 MG capsule Take 1 capsule (20 mg total) by mouth daily. 90 capsule 4  . polyethylene glycol (MIRALAX / GLYCOLAX) packet Take 17 g by mouth daily.    Marland Kitchen triamcinolone cream (KENALOG) 0.1 % Apply 1 application topically 2 (two) times daily. 30 g 0  . vitamin B-12 (CYANOCOBALAMIN) 1000 MCG tablet Take 1,000 mcg by mouth daily.     No current facility-administered medications for this visit.    Review of Systems Review of Systems  Constitutional: Negative.   Respiratory: Negative.   Cardiovascular: Negative.     Blood pressure 124/76, pulse 76, resp. rate 12, height 5\' 4"  (1.626 m), weight 137 lb (62.143 kg).  Physical Exam Physical Exam  The sutures were removed from the left breast biopsy site by the CMA without incident. . Data Reviewed kin , left areolar, punch biopsy INFILTRATING CARCINOMA CONSISTENT WITH A PRIMARY BREAST CARCINOMA, SEE DESCRIPTION Microscopic Description The epidermis  is intact and slightly acanthotic with hyperkeratotic scale. Extending through the full thickness of the dermis are infiltrating cords and nests of neoplastic cells, some with an [place] file pattern. The individual tumor cells have hyperchromatic nuclei and occasional nucleoli though most are inconspicuous. They dissect the collagen. There is some nuclear molding. I suspect this is an infiltrating ductal carcinoma of the breast involving the left areola areas. Given this histology I am taking additional levels and performing immunoperoxidase stains to further assess the cell in origin. Immunoperoxidase stains include the following: Cytokeratin 7 - negative. Cytokeratin 20 - negative. Cytokeratin AE1/AE3 - strongly positive. Cytokeratin 903 - negative. Gross cystic disease fluid protein - weakly positive.     Assessment    Left breast cancer involving the areola.     Plan    Options for management were reviewed: 1) breast conservation with sacrifice of the nipple areolar complex with likely recommendations for post surgical radiation versus 2) mastectomy with or without reconstruction. Sentinel node biopsy would be indicated in either situation.  Options for second surgical opinion were reviewed.  An informational brochure in appropriate Internet websites provided.  The patient will review her options and notify the office of how she would like to proceed.    PCP:  [name] This information has been scribed by Gaspar Cola CMA.    Robert Bellow 11/05/2015, 10:04 PM

## 2015-11-05 NOTE — Telephone Encounter (Signed)
[facility] is out of network for pt. Cancel mammograms and ultrasound per Dr Bary Castilla.

## 2015-11-06 DIAGNOSIS — Z1379 Encounter for other screening for genetic and chromosomal anomalies: Secondary | ICD-10-CM

## 2015-11-06 HISTORY — DX: Encounter for other screening for genetic and chromosomal anomalies: Z13.79

## 2015-11-07 ENCOUNTER — Other Ambulatory Visit: Payer: Self-pay | Admitting: *Deleted

## 2015-11-07 ENCOUNTER — Telehealth: Payer: Self-pay | Admitting: *Deleted

## 2015-11-07 DIAGNOSIS — C50912 Malignant neoplasm of unspecified site of left female breast: Secondary | ICD-10-CM

## 2015-11-07 NOTE — Telephone Encounter (Signed)
Patient called the office wanting to schedule a left breast lumpectomy. This patient's surgery has been scheduled for 11-18-15 at [facility]. Patient also mentioned that she has Sjgren's syndrome and wanted anesthesia to be aware of this as well.  The patient was questioning when she would start radiation after surgery and she was told it would be about 2-4 weeks after.  This patient was also scheduled for a unilateral left breast diagnostic mammogram at [facility] for 11-11-15 at 11 am.

## 2015-11-10 ENCOUNTER — Ambulatory Visit
Admission: EM | Admit: 2015-11-10 | Discharge: 2015-11-10 | Disposition: A | Discharge status: Home / Self Care | Payer: PPO | Attending: Family Medicine | Admitting: Family Medicine

## 2015-11-10 ENCOUNTER — Encounter: Payer: Self-pay | Admitting: Gynecology

## 2015-11-10 DIAGNOSIS — N390 Urinary tract infection, site not specified: Secondary | ICD-10-CM | POA: Diagnosis not present

## 2015-11-10 LAB — URINALYSIS COMPLETE WITH MICROSCOPIC (ARMC ONLY)
BILIRUBIN URINE: NEGATIVE
Glucose, UA: NEGATIVE mg/dL
KETONES UR: NEGATIVE mg/dL
NITRITE: NEGATIVE
PH: 7 (ref 5.0–8.0)
PROTEIN: NEGATIVE mg/dL
SPECIFIC GRAVITY, URINE: 1.01 (ref 1.005–1.030)

## 2015-11-10 MED ORDER — CIPROFLOXACIN HCL 500 MG PO TABS: 500.0000 mg | ORAL_TABLET | Freq: Two times a day (BID) | ORAL | Status: DC

## 2015-11-10 NOTE — ED Notes (Signed)
Patient c/o burning sensation with urination x yesterday.

## 2015-11-10 NOTE — ED Provider Notes (Signed)
CSN: [id number]     Arrival date & time 11/10/15  0805 History   First MD Initiated Contact with Patient 11/10/15 0818     Chief Complaint  Patient presents with  . Urinary Tract Infection   (Consider location/radiation/quality/duration/timing/severity/associated sxs/prior Treatment) HPI: Presents today with symptoms of dysuria, urinary frequency since yesterday. Patient states that she has been drinking cranberry juice and water thinking that that would help her symptoms. She has minimal symptoms today compared to yesterday. She denies any nausea, vomiting, abdominal pain, flank pain, hematuria.  Past Medical History  Diagnosis Date  . Menopause 40s    natural, hot flashes and mood lability now gone, off prempro 7 months  . hypothyroidism     secondary to thyroidectomy for thyroid ca  . Hypertension   . Myocardial infarction ([place]) 2013  . Stress-induced cardiomyopathy September of 2013    EF 35%. Peak troponin was 1.8.  . Cancer [facility])     thyroid takes levothyroxine  . Osteoporosis     Osteopenia  . Sjoegren syndrome ([place])   . Rosacea    Past Surgical History  Procedure Laterality Date  . Kyphosis surgery  Feb 2008    L1, Dr. Mauri Pole  . Cholecystectomy    . Tonsillectomy    . Tubal ligation    . Lumbar disc surgery      L4-L5  . Shoulder arthroscopy  2004    Left, Dr. Jefm Bryant  . Thyroidectomy      Thyroid Cancer  . Spine surgery      L4-5 diskectomy  . Cardiac catheterization  05/2012    [facility]. No significant CAD. Ejection fraction of 35% due to stress-induced cardiomyopathy.   Family History  Problem Relation Age of Onset  . COPD Father   . Cancer Father     esophageal  . Kidney disease Mother   . Heart disease Mother   . Kidney disease Sister   . Cancer Brother 71    colon cancer (both brothers)   Social History  Substance Use Topics  . Smoking status: Never Smoker   . Smokeless tobacco: Never Used  . Alcohol Use: No   OB History    Gravida Para Term  Preterm AB TAB SAB Ectopic Multiple Living   0 0 0 0 0 0 0 0 0 0       Obstetric Comments   1st Menstrual Cycle:  16 1 adopted child       Review of Systems  Allergies  Amoxicillin; Codeine; Naprosyn; Orudis; and Sulfathiazole  Home Medications   Prior to Admission medications   Medication Sig Start Date End Date Taking? Authorizing Provider  acetaminophen (TYLENOL) 500 MG tablet Take 500 mg by mouth every 6 (six) hours as needed for pain.   Yes Historical Provider, MD  aspirin 81 MG tablet Take 81 mg by mouth daily.   Yes Historical Provider, MD  Calcium Carbonate-Vitamin D (CALCIUM + D) 600-200 MG-UNIT TABS Take 1 tablet by mouth daily.     Yes Historical Provider, MD  cholecalciferol (VITAMIN D) 1000 UNITS tablet Take 1,000 Units by mouth daily.     Yes Historical Provider, MD  fish oil-omega-3 fatty acids 1000 MG capsule Take 2 g by mouth daily.   Yes Historical Provider, MD  furosemide (LASIX) 20 MG tablet Once daily as needed for fluid retention 03/06/14  Yes [name], MD  levothyroxine (SYNTHROID, LEVOTHROID) 88 MCG tablet Take 1 tablet (88 mcg total) by mouth daily. 05/21/15  Yes  [name], MD  losartan (COZAAR) 25 MG tablet Take 1 tablet (25 mg total) by mouth daily. 05/21/15  Yes [name], MD  metoprolol succinate (TOPROL XL) 25 MG 24 hr tablet Take 1 tablet (25 mg total) by mouth daily. 05/21/15  Yes [name], MD  Multiple Vitamins-Minerals (OSTEO COMPLEX PO) Take one by mouth daily   Yes Historical Provider, MD  omeprazole (PRILOSEC) 20 MG capsule Take 1 capsule (20 mg total) by mouth daily. 05/21/15  Yes [name], MD  polyethylene glycol (MIRALAX / GLYCOLAX) packet Take 17 g by mouth daily.   Yes Historical Provider, MD  triamcinolone cream (KENALOG) 0.1 % Apply 1 application topically 2 (two) times daily. 05/06/15  Yes [name], MD  vitamin B-12 (CYANOCOBALAMIN) 1000 MCG tablet Take 1,000 mcg by mouth daily.   Yes Historical Provider, MD    Meds Ordered and Administered this Visit  Medications - No data to display  BP 170/60 mmHg  Pulse 96  Temp(Src) 98.2 F (36.8 C) (Oral)  Resp 16  Ht 5\' 4"  (1.626 m)  Wt 137 lb (62.143 kg)  BMI 23.50 kg/m2  SpO2 99% No data found.   Physical Exam   GENERAL: NAD HEENT: no pharyngeal erythema, no exudate RESP: CTA B CARD: RRR ABD: +BS, NT, no flank tenderness  NEURO: CN II-XII grossly intact   ED Course  Procedures (including critical care time)  Labs Review Labs Reviewed  URINE CULTURE  URINALYSIS COMPLETEWITH MICROSCOPIC ([place])    Imaging Review No results found.   A/P: UTI- UA questionably suggestive of UTI, will send urine culture, patient declines use of Macrobid, will start patient on Cipro and patient will call back in a few days to see what urine culture results were. If no growth patient will stop antibiotic. Encourage patient to seek medical attention if any further problems.     Paulina Fusi, MD 11/10/15 [phone]

## 2015-11-11 ENCOUNTER — Ambulatory Visit
Admission: RE | Admit: 2015-11-11 | Discharge: 2015-11-11 | Disposition: A | Discharge status: Home / Self Care | Payer: PPO | Source: Ambulatory Visit | Attending: General Surgery | Admitting: General Surgery

## 2015-11-11 ENCOUNTER — Other Ambulatory Visit: Payer: Self-pay | Admitting: General Surgery

## 2015-11-11 ENCOUNTER — Telehealth: Payer: Self-pay | Admitting: General Surgery

## 2015-11-11 DIAGNOSIS — C50912 Malignant neoplasm of unspecified site of left female breast: Secondary | ICD-10-CM

## 2015-11-11 DIAGNOSIS — N63 Unspecified lump in breast: Secondary | ICD-10-CM | POA: Insufficient documentation

## 2015-11-11 DIAGNOSIS — R928 Other abnormal and inconclusive findings on diagnostic imaging of breast: Secondary | ICD-10-CM | POA: Diagnosis not present

## 2015-11-11 DIAGNOSIS — N6489 Other specified disorders of breast: Secondary | ICD-10-CM | POA: Diagnosis not present

## 2015-11-11 NOTE — Telephone Encounter (Signed)
A left breast mammogram had been obtained in preparation for the land wide excision of the cancer identified involving the areolar skin. A lesion was identified by the radiologist at the 1:00 position with associated ultrasound abnormality. Options for management include 1) core biopsy prior to planned surgery on March 13 versus 2) abandoning plans for partial mastectomy for mastectomy.  The patient desires biopsy to confirm second primary and will defer decision on mastectomy until those biopsy results are available.

## 2015-11-12 ENCOUNTER — Ambulatory Visit: Payer: PPO | Admitting: General Surgery

## 2015-11-12 LAB — URINE CULTURE

## 2015-11-13 ENCOUNTER — Ambulatory Visit (INDEPENDENT_AMBULATORY_CARE_PROVIDER_SITE_OTHER): Payer: PPO | Admitting: General Surgery

## 2015-11-13 ENCOUNTER — Encounter: Payer: Self-pay | Admitting: General Surgery

## 2015-11-13 ENCOUNTER — Other Ambulatory Visit: Payer: Self-pay

## 2015-11-13 VITALS — BP 140/80 | HR 84 | Resp 14 | Ht 64.0 in | Wt 136.0 lb

## 2015-11-13 DIAGNOSIS — C50412 Malignant neoplasm of upper-outer quadrant of left female breast: Secondary | ICD-10-CM | POA: Diagnosis not present

## 2015-11-13 DIAGNOSIS — R928 Other abnormal and inconclusive findings on diagnostic imaging of breast: Secondary | ICD-10-CM | POA: Insufficient documentation

## 2015-11-13 DIAGNOSIS — R300 Dysuria: Secondary | ICD-10-CM

## 2015-11-13 DIAGNOSIS — C50912 Malignant neoplasm of unspecified site of left female breast: Secondary | ICD-10-CM

## 2015-11-13 NOTE — Patient Instructions (Signed)
CARE AFTER BREAST BIOPSY  1. Leave the dressing on that your doctor applied after surgery. It is waterproof. You may bathe, shower and/or swim. The dressing will probably remain intact until your return office visit. If the dressing comes off, you will see small strips of tape against your skin on the incision. Do not remove these strips.  2. You may want to use a gauze,cloth or similar protection in your bra to prevent rubbing against your dressing and incision. This is not necessary, but you may feel more comfortable doing so.  3. It is recommended that you wear a bra day and night to give support to the breast. This will prevent the weight of the breast from pulling on the incision.  4. Your breast will feel hard and lumpy under the incision. Do not be alarmed. This is the underlying stitching of tissue. Softening of this tissue will occur in time.  5. Make sure you call the office and schedule an appointment in one week after your surgery. The office phone number is [phone]. The nurses at [facility] may have already done this for you.  6. You will notice about a week after your office visit that the strips of the tape on your incision will begin to loosen. These may then be removed.  7. Report to your doctor any of the following:  * Severe pain not relieved by your pain medication  *Redness of the incision  * Drainage from the incision  *Fever greater than 101 degrees 

## 2015-11-13 NOTE — Progress Notes (Signed)
Patient ID: Kim Santiago, female   DOB: [birth year], 81 y.o.   MRN: [id number]  Chief Complaint  Patient presents with  . Procedure    left breast biopsy    HPI Kim Santiago is a 81 y.o. female.  Here today for a left breast biopsy. She is currently taking Cipro for UTI symptoms that started Sunday, 11/10/2015. She had been instructed to call for the urine culture results today.  The patient recently had diagnostic mammogram on the left breast completed due to the identification of invasive mammary carcinoma involving the areolar skin. The radiologist noted abnormality Nepro outer quadrant and she is seen today for planned biopsy. No abnormalities noted on clinical exam.  I personally reviewed the patient's history.  HPI  Past Medical History  Diagnosis Date  . Menopause 40s    natural, hot flashes and mood lability now gone, off prempro 7 months  . hypothyroidism     secondary to thyroidectomy for thyroid ca  . Hypertension   . Myocardial infarction ([place]) 2013  . Stress-induced cardiomyopathy September of 2013    EF 35%. Peak troponin was 1.8.  . Cancer [facility])     thyroid takes levothyroxine  . Osteoporosis     Osteopenia  . Sjoegren syndrome ([place])   . Rosacea     Past Surgical History  Procedure Laterality Date  . Kyphosis surgery  Feb 2008    L1, Dr. Mauri Pole  . Cholecystectomy    . Tonsillectomy    . Tubal ligation    . Lumbar disc surgery      L4-L5  . Shoulder arthroscopy  2004    Left, Dr. Jefm Bryant  . Thyroidectomy      Thyroid Cancer  . Spine surgery      L4-5 diskectomy  . Cardiac catheterization  05/2012    [facility]. No significant CAD. Ejection fraction of 35% due to stress-induced cardiomyopathy.  . Breast biopsy Left 10/30/15    positive, done in Dr. Dwyane Luo office    Family History  Problem Relation Age of Onset  . COPD Father   . Cancer Father     esophageal  . Kidney disease Mother   . Heart disease Mother   . Kidney disease Sister   .  Cancer Brother 76    colon cancer (both brothers)    Social History Social History  Substance Use Topics  . Smoking status: Never Smoker   . Smokeless tobacco: Never Used  . Alcohol Use: No    Allergies  Allergen Reactions  . Amoxicillin Rash  . Codeine   . Naprosyn [Naproxen] Swelling  . Orudis [Ketoprofen]   . Sulfathiazole Rash    Current Outpatient Prescriptions  Medication Sig Dispense Refill  . acetaminophen (TYLENOL) 500 MG tablet Take 500 mg by mouth every 6 (six) hours as needed for pain.    Marland Kitchen aspirin 81 MG tablet Take 81 mg by mouth daily.    . Calcium Carbonate-Vitamin D (CALCIUM + D) 600-200 MG-UNIT TABS Take 1 tablet by mouth daily.      . cholecalciferol (VITAMIN D) 1000 UNITS tablet Take 1,000 Units by mouth daily.      . ciprofloxacin (CIPRO) 500 MG tablet Take 1 tablet (500 mg total) by mouth every 12 (twelve) hours. 10 tablet 0  . fish oil-omega-3 fatty acids 1000 MG capsule Take 2 g by mouth daily.    . furosemide (LASIX) 20 MG tablet Once daily as needed for fluid retention 60 tablet 3  .  levothyroxine (SYNTHROID, LEVOTHROID) 88 MCG tablet Take 1 tablet (88 mcg total) by mouth daily. 90 tablet 4  . losartan (COZAAR) 25 MG tablet Take 1 tablet (25 mg total) by mouth daily. 90 tablet 4  . metoprolol succinate (TOPROL XL) 25 MG 24 hr tablet Take 1 tablet (25 mg total) by mouth daily. 90 tablet 4  . Multiple Vitamins-Minerals (OSTEO COMPLEX PO) Take one by mouth daily    . omeprazole (PRILOSEC) 20 MG capsule Take 1 capsule (20 mg total) by mouth daily. 90 capsule 4  . polyethylene glycol (MIRALAX / GLYCOLAX) packet Take 17 g by mouth daily.    Marland Kitchen triamcinolone cream (KENALOG) 0.1 % Apply 1 application topically 2 (two) times daily. 30 g 0  . vitamin B-12 (CYANOCOBALAMIN) 1000 MCG tablet Take 1,000 mcg by mouth daily.     No current facility-administered medications for this visit.    Review of Systems Review of Systems  Constitutional: Negative.    Respiratory: Negative.   Cardiovascular: Negative.     Blood pressure 140/80, pulse 84, resp. rate 14, height 5\' 4"  (1.626 m), weight 136 lb (61.689 kg).  Physical Exam Physical Exam  Pulmonary/Chest:      Data Reviewed 11/11/2015 mammogram and ultrasound reviewed. BIRAD-5.  Ultrasound examination of the upper-outer quadrant of the left breast in the 1:00 position 5 cm from the nipple shows a hypoechoic mass with dense acoustic shadowing measuring 1.1 x 1.2 x 1.3 cm. BIRAD-5.  The patient was amenable to core biopsy. The skin was cleansed with alcohol followed by 10 mL of 0.5% Xylocaine with 0.25% Marcaine with 1-200,000 of epinephrine. ChloraPrep was applied to the skin. A 14-gauge Finesse biopsy device was passed through the area with pre-and post-fire images confirming location. Multiple core samples were obtained from 2 locations and the mass. A postbiopsy clip was placed. Skin defect closed with benzoin and Steri-Strips. Telfa and Tegaderm dressing applied. The procedure was well tolerated. Ice pack and postbiopsy instructions provided.  Assessment    Dysuria without associated UTI (multiple organisms on culture, 0-5 RBC/0-5 WBC; patient instructed to discontinue Cipro.  Known invasive mammary carcinoma involving the areolar skin.  Abnormal left breast mammogram, core biopsy results pending.      Plan    The patient is presently scheduled for surgery in regards to her known breast cancer on Monday, March 13. If the second biopsy site is positive for malignancy, breast conservation may not be the patient's best option is a significant volume would be lost. She was asked to consider the possibility of mastectomy if this would be best to remove all the known cancer.  Final decision regarding surgical treatment will be made after the biopsy results are available.    PCP:  [name] This information has been scribed by Karie Fetch [place].    Robert Bellow 11/13/2015, 8:20 PM

## 2015-11-14 ENCOUNTER — Other Ambulatory Visit: Payer: Self-pay | Admitting: General Surgery

## 2015-11-14 ENCOUNTER — Telehealth: Payer: Self-pay | Admitting: General Surgery

## 2015-11-14 DIAGNOSIS — C50912 Malignant neoplasm of unspecified site of left female breast: Secondary | ICD-10-CM

## 2015-11-14 NOTE — Telephone Encounter (Signed)
The patient was notified that the second biopsy from the left breast did confirm the clinically suspected invasive mammary carcinoma. In light of the cancer involving the central portion of the breast and in the upper-outer quadrant I think she would be best served by mastectomy. She is amenable. She'll go to preadmission testing oral.  She was encouraged to go over to [facility] in the next day or so for a presurgery evaluation.  We'll plan to proceed with mastectomy and sentinel node biopsy as previously scheduled on 11/18/2015.

## 2015-11-14 NOTE — Telephone Encounter (Signed)
Message left on [facility] Posting line regarding change. Surgery scheduling sheet also re-faxed with changes.  Pat in the Pre-admission testing department has been notified.   Dr. Bary Castilla- please make sure orders are complete before patient's pre-admit appointment on 11-15-15 at 9:45 am. Thanks.

## 2015-11-14 NOTE — H&P (Signed)
Kim Santiago is a 81 y.o. female here today to discuss left areolar excision done on 10/30/15. The patient is accompanied today by her daughter, Kim Santiago. The patient had previously been notified by phone of her biopsy results.  I personally reviewed the patient's history   HPI  Past Medical History  Diagnosis Date  . Menopause 40s    natural, hot flashes and mood lability now gone, off prempro 7 months  . hypothyroidism     secondary to thyroidectomy for thyroid ca  . Hypertension   . Myocardial infarction ([place]) 2013  . Stress-induced cardiomyopathy September of 2013    EF 35%. Peak troponin was 1.8.  . Cancer [facility])     thyroid takes levothyroxine  . Osteoporosis     Osteopenia  . Sjoegren syndrome ([place])   . Rosacea     Past Surgical History  Procedure Laterality Date  . Kyphosis surgery  Feb 2008    L1, Dr. Mauri Pole  . Cholecystectomy    . Tonsillectomy    . Tubal ligation    . Lumbar disc surgery      L4-L5  . Shoulder arthroscopy  2004    Left, Dr. Jefm Bryant  . Thyroidectomy      Thyroid Cancer  . Spine surgery      L4-5 diskectomy  . Cardiac catheterization  05/2012    [facility]. No significant CAD. Ejection fraction of 35% due to stress-induced cardiomyopathy.    Family History  Problem Relation Age of Onset  . COPD Father   . Cancer Father     esophageal  . Kidney disease Mother   . Heart disease Mother   . Kidney disease Sister   . Cancer Brother 45    colon cancer (both brothers)    Social History Social History  Substance Use Topics  . Smoking status: Never Smoker   . Smokeless tobacco: Never Used  . Alcohol Use: No    Allergies  Allergen Reactions  . Amoxicillin Rash  . Codeine   . Naprosyn [Naproxen] Swelling  . Orudis [Ketoprofen]   .  Sulfathiazole Rash    Current Outpatient Prescriptions  Medication Sig Dispense Refill  . acetaminophen (TYLENOL) 500 MG tablet Take 500 mg by mouth every 6 (six) hours as needed for pain.    Marland Kitchen aspirin 81 MG tablet Take 81 mg by mouth daily.    . Calcium Carbonate-Vitamin D (CALCIUM + D) 600-200 MG-UNIT TABS Take 1 tablet by mouth daily.     . cholecalciferol (VITAMIN D) 1000 UNITS tablet Take 1,000 Units by mouth daily.     . fish oil-omega-3 fatty acids 1000 MG capsule Take 2 g by mouth daily.    . furosemide (LASIX) 20 MG tablet Once daily as needed for fluid retention 60 tablet 3  . levothyroxine (SYNTHROID, LEVOTHROID) 88 MCG tablet Take 1 tablet (88 mcg total) by mouth daily. 90 tablet 4  . losartan (COZAAR) 25 MG tablet Take 1 tablet (25 mg total) by mouth daily. 90 tablet 4  . metoprolol succinate (TOPROL XL) 25 MG 24 hr tablet Take 1 tablet (25 mg total) by mouth daily. 90 tablet 4  . Multiple Vitamins-Minerals (OSTEO COMPLEX PO) Take one by mouth daily    . omeprazole (PRILOSEC) 20 MG capsule Take 1 capsule (20 mg total) by mouth daily. 90 capsule 4  . polyethylene glycol (MIRALAX / GLYCOLAX) packet Take 17 g by mouth daily.    Marland Kitchen triamcinolone cream (KENALOG) 0.1 % Apply 1  application topically 2 (two) times daily. 30 g 0  . vitamin B-12 (CYANOCOBALAMIN) 1000 MCG tablet Take 1,000 mcg by mouth daily.     No current facility-administered medications for this visit.    Review of Systems Review of Systems  Constitutional: Negative.  Respiratory: Negative.  Cardiovascular: Negative.    Blood pressure 124/76, pulse 76, resp. rate 12, height 5\' 4"  (1.626 m), weight 137 lb (62.143 kg).  Physical Exam Physical Exam  The sutures were removed from the left breast biopsy site by the CMA without incident. . Data Reviewed kin , left areolar, punch biopsy INFILTRATING CARCINOMA CONSISTENT WITH A  PRIMARY BREAST CARCINOMA, SEE DESCRIPTION Microscopic Description The epidermis is intact and slightly acanthotic with hyperkeratotic scale. Extending through the full thickness of the dermis are infiltrating cords and nests of neoplastic cells, some with an [place] file pattern. The individual tumor cells have hyperchromatic nuclei and occasional nucleoli though most are inconspicuous. They dissect the collagen. There is some nuclear molding. I suspect this is an infiltrating ductal carcinoma of the breast involving the left areola areas. Given this histology I am taking additional levels and performing immunoperoxidase stains to further assess the cell in origin. Immunoperoxidase stains include the following: Cytokeratin 7 - negative. Cytokeratin 20 - negative. Cytokeratin AE1/AE3 - strongly positive. Cytokeratin 903 - negative. Gross cystic disease fluid protein - weakly positive.    Assessment    Left breast cancer involving the areola.     Plan    Options for management were reviewed: 1) breast conservation with sacrifice of the nipple areolar complex with likely recommendations for post surgical radiation versus 2) mastectomy with or without reconstruction. Sentinel node biopsy would be indicated in either situation.  Options for second surgical opinion were reviewed.  An informational brochure in appropriate Internet websites provided.  The patient will review her options and notify the office of how she would like to proceed.    PCP: [name] This information has been scribed by Gaspar Cola CMA.    Robert Bellow 11/05/2015, 10:04 PM       The patient is presently scheduled for surgery in regards to her known breast cancer on Monday, March 13. If the second biopsy site is positive for malignancy, breast conservation may not be the patient's best option is a significant volume would be lost. She was asked to consider the possibility of mastectomy if  this would be best to remove all the known cancer.  Final decision regarding surgical treatment will be made after the biopsy results are available.    The patient was notified that the second biopsy from the left breast did confirm the clinically suspected invasive mammary carcinoma. In light of the cancer involving the central portion of the breast and in the upper-outer quadrant I think she would be best served by mastectomy. She is amenable.

## 2015-11-15 ENCOUNTER — Telehealth: Payer: Self-pay | Admitting: General Surgery

## 2015-11-15 ENCOUNTER — Encounter
Admission: RE | Admit: 2015-11-15 | Discharge: 2015-11-15 | Disposition: A | Discharge status: Home / Self Care | Payer: PPO | Source: Ambulatory Visit | Attending: General Surgery | Admitting: General Surgery

## 2015-11-15 DIAGNOSIS — Z841 Family history of disorders of kidney and ureter: Secondary | ICD-10-CM | POA: Diagnosis not present

## 2015-11-15 DIAGNOSIS — I10 Essential (primary) hypertension: Secondary | ICD-10-CM | POA: Diagnosis not present

## 2015-11-15 DIAGNOSIS — E890 Postprocedural hypothyroidism: Secondary | ICD-10-CM | POA: Diagnosis not present

## 2015-11-15 DIAGNOSIS — Z885 Allergy status to narcotic agent status: Secondary | ICD-10-CM | POA: Diagnosis not present

## 2015-11-15 DIAGNOSIS — C50412 Malignant neoplasm of upper-outer quadrant of left female breast: Secondary | ICD-10-CM | POA: Diagnosis not present

## 2015-11-15 DIAGNOSIS — L719 Rosacea, unspecified: Secondary | ICD-10-CM | POA: Diagnosis not present

## 2015-11-15 DIAGNOSIS — Z882 Allergy status to sulfonamides status: Secondary | ICD-10-CM | POA: Diagnosis not present

## 2015-11-15 DIAGNOSIS — I429 Cardiomyopathy, unspecified: Secondary | ICD-10-CM | POA: Diagnosis not present

## 2015-11-15 DIAGNOSIS — M3500 Sicca syndrome, unspecified: Secondary | ICD-10-CM | POA: Diagnosis not present

## 2015-11-15 DIAGNOSIS — Z85850 Personal history of malignant neoplasm of thyroid: Secondary | ICD-10-CM | POA: Diagnosis not present

## 2015-11-15 DIAGNOSIS — C50012 Malignant neoplasm of nipple and areola, left female breast: Secondary | ICD-10-CM | POA: Diagnosis not present

## 2015-11-15 DIAGNOSIS — C773 Secondary and unspecified malignant neoplasm of axilla and upper limb lymph nodes: Secondary | ICD-10-CM | POA: Diagnosis not present

## 2015-11-15 DIAGNOSIS — N189 Chronic kidney disease, unspecified: Secondary | ICD-10-CM | POA: Diagnosis not present

## 2015-11-15 DIAGNOSIS — Z9049 Acquired absence of other specified parts of digestive tract: Secondary | ICD-10-CM | POA: Diagnosis not present

## 2015-11-15 DIAGNOSIS — I129 Hypertensive chronic kidney disease with stage 1 through stage 4 chronic kidney disease, or unspecified chronic kidney disease: Secondary | ICD-10-CM | POA: Diagnosis not present

## 2015-11-15 DIAGNOSIS — M810 Age-related osteoporosis without current pathological fracture: Secondary | ICD-10-CM | POA: Diagnosis not present

## 2015-11-15 DIAGNOSIS — I252 Old myocardial infarction: Secondary | ICD-10-CM | POA: Diagnosis not present

## 2015-11-15 DIAGNOSIS — Z881 Allergy status to other antibiotic agents status: Secondary | ICD-10-CM | POA: Diagnosis not present

## 2015-11-15 DIAGNOSIS — Z888 Allergy status to other drugs, medicaments and biological substances status: Secondary | ICD-10-CM | POA: Diagnosis not present

## 2015-11-15 DIAGNOSIS — Z800 Family history of malignant neoplasm of digestive organs: Secondary | ICD-10-CM | POA: Diagnosis not present

## 2015-11-15 DIAGNOSIS — K219 Gastro-esophageal reflux disease without esophagitis: Secondary | ICD-10-CM | POA: Diagnosis not present

## 2015-11-15 DIAGNOSIS — M8580 Other specified disorders of bone density and structure, unspecified site: Secondary | ICD-10-CM | POA: Diagnosis not present

## 2015-11-15 DIAGNOSIS — C50912 Malignant neoplasm of unspecified site of left female breast: Secondary | ICD-10-CM | POA: Diagnosis not present

## 2015-11-15 DIAGNOSIS — Z825 Family history of asthma and other chronic lower respiratory diseases: Secondary | ICD-10-CM | POA: Diagnosis not present

## 2015-11-15 HISTORY — DX: Hypothyroidism, unspecified: E03.9

## 2015-11-15 HISTORY — DX: Unspecified osteoarthritis, unspecified site: M19.90

## 2015-11-15 HISTORY — DX: Chronic kidney disease, unspecified: N18.9

## 2015-11-15 LAB — COMPREHENSIVE METABOLIC PANEL
ALK PHOS: 74 U/L (ref 38–126)
ALT: 7 U/L — AB (ref 14–54)
AST: 19 U/L (ref 15–41)
Albumin: 3.9 g/dL (ref 3.5–5.0)
Anion gap: 3 — ABNORMAL LOW (ref 5–15)
BILIRUBIN TOTAL: 0.7 mg/dL (ref 0.3–1.2)
BUN: 11 mg/dL (ref 6–20)
CALCIUM: 8.8 mg/dL — AB (ref 8.9–10.3)
CO2: 29 mmol/L (ref 22–32)
CREATININE: 0.63 mg/dL (ref 0.44–1.00)
Chloride: 103 mmol/L (ref 101–111)
GFR calc Af Amer: >60 mL/min (ref >60)
Glucose, Bld: 95 mg/dL (ref 65–99)
POTASSIUM: 4.3 mmol/L (ref 3.5–5.1)
Sodium: 135 mmol/L (ref 135–145)
TOTAL PROTEIN: 6.9 g/dL (ref 6.5–8.1)

## 2015-11-15 LAB — CBC WITH DIFFERENTIAL/PLATELET
BASOS PCT: 2 %
Basophils Absolute: 0.1 10*3/uL (ref 0–0.1)
EOS ABS: 0.1 10*3/uL (ref 0–0.7)
EOS PCT: 2 %
HCT: 35.6 % (ref 35.0–47.0)
Hemoglobin: 12.2 g/dL (ref 12.0–16.0)
LYMPHS ABS: 1.1 10*3/uL (ref 1.0–3.6)
Lymphocytes Relative: 26 %
MCH: 31.1 pg (ref 26.0–34.0)
MCHC: 34.2 g/dL (ref 32.0–36.0)
MCV: 90.9 fL (ref 80.0–100.0)
MONOS PCT: 10 %
Monocytes Absolute: 0.4 10*3/uL (ref 0.2–0.9)
NEUTROS PCT: 60 %
Neutro Abs: 2.5 10*3/uL (ref 1.4–6.5)
Platelets: 187 10*3/uL (ref 150–440)
RBC: 3.91 MIL/uL (ref 3.80–5.20)
RDW: 14.2 % (ref 11.5–14.5)
WBC: 4.1 10*3/uL (ref 3.6–11.0)

## 2015-11-15 NOTE — Patient Instructions (Signed)
  Your procedure is scheduled on: November 18, 2015 (Monday) Report to Radiology.([facility]) Second Desk on Right (Nuclear Medicine To find out your arrival time please call [phone] between 1PM - 3PM on ARRIVAL TIME 11:00 AM.  Remember: Instructions that are not followed completely may result in serious medical risk, up to and including death, or upon the discretion of your surgeon and anesthesiologist your surgery may need to be rescheduled.    __x__ 1. Do not eat food or drink liquids after midnight. No gum chewing or hard candies.     __x__ 2. No Alcohol for 24 hours before or after surgery.   ____ 3. Bring all medications with you on the day of surgery if instructed.    __x__ 4. Notify your doctor if there is any change in your medical condition     (cold, fever, infections).     Do not wear jewelry, make-up, hairpins, clips or nail polish.  Do not wear lotions, powders, or perfumes. You may wear deodorant.  Do not shave 48 hours prior to surgery. Men may shave face and neck.  Do not bring valuables to the hospital.    [facility] is not responsible for any belongings or valuables.               Contacts, dentures or bridgework may not be worn into surgery.  Leave your suitcase in the car. After surgery it may be brought to your room.  For patients admitted to the hospital, discharge time is determined by your                treatment team.   Patients discharged the day of surgery will not be allowed to drive home.   Please read over the following fact sheets that you were given:   Surgical Site Infection Prevention   _x__ Take these medicines the morning of surgery with A SIP OF WATER:    1. Losartan  2. Metoprolol  3. Prilosec (Prilosec at bedtime on March 12, Sunday night)  4.  5.  6.  ____ Fleet Enema (as directed)   ____ Use CHG Soap as directed  ____ Use inhalers on the day of surgery  ____ Stop metformin 2 days prior to surgery    ____ Take 1/2 of  usual insulin dose the night before surgery and none on the morning of surgery.   __x__ Stop Coumadin/Plavix/aspirin on (PATIENT STOPPED ASPIRIN MARCH 9)  __x__ Stop Anti-inflammatories on (NO NSAIDS) Tylenol ok to take for pain if needed   __x__ Stop supplements until after surgery.  (STOP FISH OIL, OSTEO COMPLEX, AND VITAMIN B-12 NOW)  ____ Bring C-Pap to the hospital.

## 2015-11-15 NOTE — Telephone Encounter (Signed)
Patient had questions about immediate reconstruction. Discouraged at this time. Can re-address after recovery.  She is comfortable with plans for mastectomy.

## 2015-11-15 NOTE — Pre-Procedure Instructions (Signed)
Dr. Laureen Abrahams notified of EKG obtained  today at pre-op visit and stated patient" should be ok for surgery".

## 2015-11-16 LAB — CANCER ANTIGEN 27.29: CA 27.29: 30.4 U/mL (ref 0.0–38.6)

## 2015-11-18 ENCOUNTER — Ambulatory Visit
Admission: RE | Admit: 2015-11-18 | Discharge: 2015-11-18 | Disposition: A | Discharge status: Home / Self Care | Payer: PPO | Source: Ambulatory Visit | Attending: General Surgery | Admitting: General Surgery

## 2015-11-18 ENCOUNTER — Encounter
Admission: RE | Disposition: A | Discharge status: Home / Self Care | Payer: Self-pay | Source: Ambulatory Visit | Attending: General Surgery

## 2015-11-18 ENCOUNTER — Encounter: Payer: Self-pay | Admitting: *Deleted

## 2015-11-18 ENCOUNTER — Ambulatory Visit: Payer: PPO | Admitting: Anesthesiology

## 2015-11-18 ENCOUNTER — Encounter
Admission: RE | Admit: 2015-11-18 | Discharge: 2015-11-18 | Disposition: A | Discharge status: Home / Self Care | Payer: PPO | Source: Ambulatory Visit | Attending: General Surgery | Admitting: General Surgery

## 2015-11-18 DIAGNOSIS — Z9049 Acquired absence of other specified parts of digestive tract: Secondary | ICD-10-CM | POA: Insufficient documentation

## 2015-11-18 DIAGNOSIS — M3500 Sicca syndrome, unspecified: Secondary | ICD-10-CM | POA: Insufficient documentation

## 2015-11-18 DIAGNOSIS — N189 Chronic kidney disease, unspecified: Secondary | ICD-10-CM | POA: Insufficient documentation

## 2015-11-18 DIAGNOSIS — Z881 Allergy status to other antibiotic agents status: Secondary | ICD-10-CM | POA: Insufficient documentation

## 2015-11-18 DIAGNOSIS — I429 Cardiomyopathy, unspecified: Secondary | ICD-10-CM | POA: Insufficient documentation

## 2015-11-18 DIAGNOSIS — E039 Hypothyroidism, unspecified: Secondary | ICD-10-CM | POA: Diagnosis not present

## 2015-11-18 DIAGNOSIS — L719 Rosacea, unspecified: Secondary | ICD-10-CM | POA: Insufficient documentation

## 2015-11-18 DIAGNOSIS — Z825 Family history of asthma and other chronic lower respiratory diseases: Secondary | ICD-10-CM | POA: Insufficient documentation

## 2015-11-18 DIAGNOSIS — C50912 Malignant neoplasm of unspecified site of left female breast: Secondary | ICD-10-CM | POA: Diagnosis not present

## 2015-11-18 DIAGNOSIS — M810 Age-related osteoporosis without current pathological fracture: Secondary | ICD-10-CM | POA: Insufficient documentation

## 2015-11-18 DIAGNOSIS — Z885 Allergy status to narcotic agent status: Secondary | ICD-10-CM | POA: Insufficient documentation

## 2015-11-18 DIAGNOSIS — I2510 Atherosclerotic heart disease of native coronary artery without angina pectoris: Secondary | ICD-10-CM | POA: Diagnosis not present

## 2015-11-18 DIAGNOSIS — Z882 Allergy status to sulfonamides status: Secondary | ICD-10-CM | POA: Insufficient documentation

## 2015-11-18 DIAGNOSIS — M8580 Other specified disorders of bone density and structure, unspecified site: Secondary | ICD-10-CM | POA: Insufficient documentation

## 2015-11-18 DIAGNOSIS — Z888 Allergy status to other drugs, medicaments and biological substances status: Secondary | ICD-10-CM | POA: Insufficient documentation

## 2015-11-18 DIAGNOSIS — I129 Hypertensive chronic kidney disease with stage 1 through stage 4 chronic kidney disease, or unspecified chronic kidney disease: Secondary | ICD-10-CM | POA: Insufficient documentation

## 2015-11-18 DIAGNOSIS — C50012 Malignant neoplasm of nipple and areola, left female breast: Secondary | ICD-10-CM | POA: Insufficient documentation

## 2015-11-18 DIAGNOSIS — Z800 Family history of malignant neoplasm of digestive organs: Secondary | ICD-10-CM | POA: Insufficient documentation

## 2015-11-18 DIAGNOSIS — I252 Old myocardial infarction: Secondary | ICD-10-CM | POA: Insufficient documentation

## 2015-11-18 DIAGNOSIS — I10 Essential (primary) hypertension: Secondary | ICD-10-CM | POA: Diagnosis not present

## 2015-11-18 DIAGNOSIS — C773 Secondary and unspecified malignant neoplasm of axilla and upper limb lymph nodes: Secondary | ICD-10-CM | POA: Insufficient documentation

## 2015-11-18 DIAGNOSIS — E890 Postprocedural hypothyroidism: Secondary | ICD-10-CM | POA: Insufficient documentation

## 2015-11-18 DIAGNOSIS — Z841 Family history of disorders of kidney and ureter: Secondary | ICD-10-CM | POA: Insufficient documentation

## 2015-11-18 DIAGNOSIS — Z85850 Personal history of malignant neoplasm of thyroid: Secondary | ICD-10-CM | POA: Insufficient documentation

## 2015-11-18 DIAGNOSIS — C50412 Malignant neoplasm of upper-outer quadrant of left female breast: Secondary | ICD-10-CM | POA: Insufficient documentation

## 2015-11-18 DIAGNOSIS — K219 Gastro-esophageal reflux disease without esophagitis: Secondary | ICD-10-CM | POA: Insufficient documentation

## 2015-11-18 DIAGNOSIS — C50919 Malignant neoplasm of unspecified site of unspecified female breast: Secondary | ICD-10-CM

## 2015-11-18 HISTORY — DX: Malignant neoplasm of unspecified site of unspecified female breast: C50.919

## 2015-11-18 HISTORY — PX: MASTECTOMY: SHX3

## 2015-11-18 HISTORY — PX: SENTINEL NODE BIOPSY: SHX6608

## 2015-11-18 HISTORY — PX: SIMPLE MASTECTOMY WITH AXILLARY SENTINEL NODE BIOPSY: SHX6098

## 2015-11-18 SURGERY — SIMPLE MASTECTOMY
Anesthesia: General | Laterality: Left | Wound class: Clean

## 2015-11-18 MED ORDER — ACETAMINOPHEN 10 MG/ML IV SOLN
INTRAVENOUS | Status: AC
  Filled 2015-11-18: qty 100

## 2015-11-18 MED ORDER — ONDANSETRON HCL 4 MG/2ML IJ SOLN
INTRAMUSCULAR | Status: DC | PRN
  Administered 2015-11-18: 4 mg via INTRAVENOUS

## 2015-11-18 MED ORDER — PROPOFOL 10 MG/ML IV BOLUS
INTRAVENOUS | Status: DC | PRN
  Administered 2015-11-18: 30 mg via INTRAVENOUS
  Administered 2015-11-18: 80 mg via INTRAVENOUS

## 2015-11-18 MED ORDER — LACTATED RINGERS IV SOLN
INTRAVENOUS | Status: DC
  Administered 2015-11-18: 13:00:00 via INTRAVENOUS

## 2015-11-18 MED ORDER — SODIUM CHLORIDE 0.9 % IJ SOLN
INTRAMUSCULAR | Status: AC
  Filled 2015-11-18: qty 10

## 2015-11-18 MED ORDER — ONDANSETRON HCL 4 MG/2ML IJ SOLN
INTRAMUSCULAR | Status: AC
  Administered 2015-11-18: 4 mg via INTRAVENOUS
  Filled 2015-11-18: qty 2

## 2015-11-18 MED ORDER — ONDANSETRON HCL 4 MG/2ML IJ SOLN
4.0000 mg | Freq: Once | INTRAMUSCULAR | Status: AC | PRN
  Administered 2015-11-18: 4 mg via INTRAVENOUS

## 2015-11-18 MED ORDER — CEFAZOLIN SODIUM-DEXTROSE 2-3 GM-% IV SOLR
2.0000 g | INTRAVENOUS | Status: AC
  Administered 2015-11-18: 2 g via INTRAVENOUS

## 2015-11-18 MED ORDER — TRAMADOL HCL 50 MG PO TABS
50.0000 mg | ORAL_TABLET | ORAL | Status: DC | PRN
  Administered 2015-11-18: 50 mg via ORAL

## 2015-11-18 MED ORDER — TRAMADOL HCL 50 MG PO TABS
ORAL_TABLET | ORAL | Status: AC
  Administered 2015-11-18: 50 mg via ORAL
  Filled 2015-11-18: qty 1

## 2015-11-18 MED ORDER — FENTANYL CITRATE (PF) 100 MCG/2ML IJ SOLN
INTRAMUSCULAR | Status: DC | PRN
  Administered 2015-11-18 (×3): 25 ug via INTRAVENOUS
  Administered 2015-11-18: 50 ug via INTRAVENOUS
  Administered 2015-11-18 (×3): 25 ug via INTRAVENOUS

## 2015-11-18 MED ORDER — ACETAMINOPHEN 10 MG/ML IV SOLN
INTRAVENOUS | Status: DC | PRN
  Administered 2015-11-18: 1000 mg via INTRAVENOUS

## 2015-11-18 MED ORDER — FENTANYL CITRATE (PF) 100 MCG/2ML IJ SOLN
25.0000 ug | INTRAMUSCULAR | Status: AC | PRN
  Administered 2015-11-18 (×6): 25 ug via INTRAVENOUS

## 2015-11-18 MED ORDER — TRAMADOL HCL 50 MG PO TABS: 50.0000 mg | ORAL_TABLET | ORAL | Status: DC | PRN

## 2015-11-18 MED ORDER — FENTANYL CITRATE (PF) 100 MCG/2ML IJ SOLN
INTRAMUSCULAR | Status: AC
  Administered 2015-11-18: 25 ug via INTRAVENOUS
  Filled 2015-11-18: qty 2

## 2015-11-18 MED ORDER — ISOSULFAN BLUE 1 % ~~LOC~~ SOLN
SUBCUTANEOUS | Status: AC
  Filled 2015-11-18: qty 5

## 2015-11-18 MED ORDER — HYDROMORPHONE HCL 1 MG/ML IJ SOLN
0.2500 mg | INTRAMUSCULAR | Status: DC | PRN
  Administered 2015-11-18 (×3): 0.25 mg via INTRAVENOUS

## 2015-11-18 MED ORDER — LIDOCAINE HCL (CARDIAC) 20 MG/ML IV SOLN
INTRAVENOUS | Status: DC | PRN
  Administered 2015-11-18: 60 mg via INTRAVENOUS

## 2015-11-18 MED ORDER — ISOSULFAN BLUE 1 % ~~LOC~~ SOLN
SUBCUTANEOUS | Status: DC | PRN
  Administered 2015-11-18: 6 mL via SUBCUTANEOUS

## 2015-11-18 MED ORDER — EPHEDRINE SULFATE 50 MG/ML IJ SOLN
INTRAMUSCULAR | Status: DC | PRN
  Administered 2015-11-18 (×2): 5 mg via INTRAVENOUS

## 2015-11-18 MED ORDER — CEFAZOLIN SODIUM-DEXTROSE 2-3 GM-% IV SOLR
INTRAVENOUS | Status: AC
  Administered 2015-11-18: 2 g via INTRAVENOUS
  Filled 2015-11-18: qty 50

## 2015-11-18 MED ORDER — DEXAMETHASONE SODIUM PHOSPHATE 10 MG/ML IJ SOLN
INTRAMUSCULAR | Status: DC | PRN
  Administered 2015-11-18: 10 mg via INTRAVENOUS

## 2015-11-18 MED ORDER — PHENYLEPHRINE HCL 10 MG/ML IJ SOLN
INTRAMUSCULAR | Status: DC | PRN
  Administered 2015-11-18: 50 ug via INTRAVENOUS
  Administered 2015-11-18: 100 ug via INTRAVENOUS
  Administered 2015-11-18: 50 ug via INTRAVENOUS

## 2015-11-18 MED ORDER — TECHNETIUM TC 99M SULFUR COLLOID
1.0400 | Freq: Once | INTRAVENOUS | Status: AC | PRN
  Administered 2015-11-18: 1.04 via INTRAVENOUS

## 2015-11-18 MED ORDER — BUPIVACAINE-EPINEPHRINE (PF) 0.5% -1:200000 IJ SOLN
INTRAMUSCULAR | Status: AC
  Filled 2015-11-18: qty 30

## 2015-11-18 MED ORDER — LACTATED RINGERS IV SOLN
INTRAVENOUS | Status: DC | PRN
  Administered 2015-11-18: 13:00:00 via INTRAVENOUS

## 2015-11-18 MED ORDER — HYDROMORPHONE HCL 1 MG/ML IJ SOLN
INTRAMUSCULAR | Status: AC
  Filled 2015-11-18: qty 1

## 2015-11-18 SURGICAL SUPPLY — 53 items
APPLIER CLIP 11 MED OPEN (CLIP)
APPLIER CLIP 13 LRG OPEN (CLIP)
BANDAGE ELASTIC 6 LF NS (GAUZE/BANDAGES/DRESSINGS) ×2 IMPLANT
BLADE SURG 15 STRL SS SAFETY (BLADE) ×2 IMPLANT
BNDG GAUZE 4.5X4.1 6PLY STRL (MISCELLANEOUS) ×2 IMPLANT
BULB RESERV EVAC DRAIN JP 100C (MISCELLANEOUS) ×2 IMPLANT
CANISTER SUCT 1200ML W/VALVE (MISCELLANEOUS) ×2 IMPLANT
CHLORAPREP W/TINT 26ML (MISCELLANEOUS) ×2 IMPLANT
CLIP APPLIE 11 MED OPEN (CLIP) IMPLANT
CLIP APPLIE 13 LRG OPEN (CLIP) IMPLANT
CNTNR SPEC 2.5X3XGRAD LEK (MISCELLANEOUS) ×1
CONT SPEC 4OZ STER OR WHT (MISCELLANEOUS) ×1
CONTAINER SPEC 2.5X3XGRAD LEK (MISCELLANEOUS) ×1 IMPLANT
COVER PROBE FLX POLY STRL (MISCELLANEOUS) ×2 IMPLANT
DEVICE DUBIN SPECIMEN MAMMOGRA (MISCELLANEOUS) IMPLANT
DRAIN CHANNEL JP 15F RND 16 (MISCELLANEOUS) ×2 IMPLANT
DRAPE LAPAROTOMY TRNSV 106X77 (MISCELLANEOUS) ×2 IMPLANT
DRSG TELFA 3X8 NADH (GAUZE/BANDAGES/DRESSINGS) ×2 IMPLANT
ELECT CAUTERY BLADE TIP 2.5 (TIP) ×2
ELECT REM PT RETURN 9FT ADLT (ELECTROSURGICAL) ×2
ELECTRODE CAUTERY BLDE TIP 2.5 (TIP) ×1 IMPLANT
ELECTRODE REM PT RTRN 9FT ADLT (ELECTROSURGICAL) ×1 IMPLANT
GAUZE FLUFF 18X24 1PLY STRL (GAUZE/BANDAGES/DRESSINGS) ×2 IMPLANT
GAUZE SPONGE 4X4 12PLY STRL (GAUZE/BANDAGES/DRESSINGS) IMPLANT
GLOVE BIO SURGEON STRL SZ7.5 (GLOVE) ×6 IMPLANT
GLOVE INDICATOR 8.0 STRL GRN (GLOVE) ×6 IMPLANT
GOWN STRL REUS W/ TWL LRG LVL3 (GOWN DISPOSABLE) ×2 IMPLANT
GOWN STRL REUS W/TWL LRG LVL3 (GOWN DISPOSABLE) ×2
KIT RM TURNOVER STRD PROC AR (KITS) ×2 IMPLANT
LABEL OR SOLS (LABEL) IMPLANT
NDL SAFETY 18GX1.5 (NEEDLE) ×2 IMPLANT
NDL SAFETY 22GX1.5 (NEEDLE) IMPLANT
PACK BASIN MINOR ARMC (MISCELLANEOUS) ×2 IMPLANT
PIN SAFETY STRL (MISCELLANEOUS) IMPLANT
SHEARS FOC LG CVD HARMONIC 17C (MISCELLANEOUS) ×2 IMPLANT
SLEVE PROBE SENORX GAMMA FIND (MISCELLANEOUS) ×2 IMPLANT
SPONGE LAP 18X18 5 PK (GAUZE/BANDAGES/DRESSINGS) ×2 IMPLANT
STRIP CLOSURE SKIN 1/2X4 (GAUZE/BANDAGES/DRESSINGS) ×2 IMPLANT
SUT ETHILON 3-0 FS-10 30 BLK (SUTURE) ×2
SUT SILK 0 (SUTURE) ×1
SUT SILK 0 30XBRD TIE 6 (SUTURE) ×1 IMPLANT
SUT SILK 3 0 (SUTURE)
SUT SILK 3-0 18XBRD TIE 12 (SUTURE) IMPLANT
SUT VIC AB 2-0 CT1 27 (SUTURE) ×2
SUT VIC AB 2-0 CT1 TAPERPNT 27 (SUTURE) ×2 IMPLANT
SUT VIC AB 2-0 CT2 27 (SUTURE) IMPLANT
SUT VIC AB 3-0 SH 27 (SUTURE)
SUT VIC AB 3-0 SH 27X BRD (SUTURE) IMPLANT
SUT VICRYL+ 3-0 144IN (SUTURE) ×2 IMPLANT
SUTURE EHLN 3-0 FS-10 30 BLK (SUTURE) ×1 IMPLANT
SWABSTK COMLB BENZOIN TINCTURE (MISCELLANEOUS) ×2 IMPLANT
SYRINGE 10CC LL (SYRINGE) ×2 IMPLANT
TAPE TRANSPORE STRL 2 31045 (GAUZE/BANDAGES/DRESSINGS) ×2 IMPLANT

## 2015-11-18 NOTE — Anesthesia Procedure Notes (Signed)
Procedure Name: LMA Insertion Date/Time: 11/18/2015 1:18 PM Performed by: Jenetta Downer Pre-anesthesia Checklist: Patient identified, Emergency Drugs available, Suction available, Patient being monitored and Timeout performed Patient Re-evaluated:Patient Re-evaluated prior to inductionOxygen Delivery Method: Circle system utilized and Simple face mask Preoxygenation: Pre-oxygenation with 100% oxygen Intubation Type: IV induction Ventilation: Mask ventilation without difficulty LMA Size: 4.0 Nasal Tubes: Magill forceps- large, utilized Number of attempts: 2 Placement Confirmation: positive ETCO2 and breath sounds checked- equal and bilateral Tube secured with: Tape

## 2015-11-18 NOTE — Discharge Instructions (Signed)
AMBULATORY SURGERY  °DISCHARGE INSTRUCTIONS ° ° °1) The drugs that you were given will stay in your system until tomorrow so for the next 24 hours you should not: ° °A) Drive an automobile °B) Make any legal decisions °C) Drink any alcoholic beverage ° ° °2) You may resume regular meals tomorrow.  Today it is better to start with liquids and gradually work up to solid foods. ° °You may eat anything you prefer, but it is better to start with liquids, then soup and crackers, and gradually work up to solid foods. ° ° °3) Please notify your doctor immediately if you have any unusual bleeding, trouble breathing, redness and pain at the surgery site, drainage, fever, or pain not relieved by medication. ° ° ° °4) Additional Instructions: ° ° ° ° ° ° ° °Please contact your physician with any problems or Same Day Surgery at [phone], Monday through Friday 6 am to 4 pm, or [place] at [place] [facility] number at [phone]. °

## 2015-11-18 NOTE — Op Note (Addendum)
Preoperative diagnosis: Multifocal carcinoma of the left breast.  Postoperative diagnosis: Same.  Operative procedure: Left simple mastectomy with sentinel node biopsy.  Operating surgeon: Ollen Bowl, M.D.  Anesthesia: Gen. by LMA  Estimated blood loss: 50 mL.  Clinical note: This 81 year old woman noticed a nodular area on the inferior aspect of the areola of the left breast. Core biopsy showed invasive mammary carcinoma. Postbiopsy mammogram showed an area in the upper-outer quadrant of the left breast which on ultrasound-guided biopsy was a second foci of invasive mammary carcinoma. She was felt to be best served by mastectomy. She was injected with technetium sulfur colloid prior to the procedure.  Operative note: SCD stockings were used for DVT prevention. A total of 4 mL of a 2 to 1 mix of saline/Lymphazurin blue was injected in the subareolar plexus. The breast chest and neck so was then prepped with ChloraPrep and draped. An elliptical incision was outlined. The plasma blade was used for skin incision and dissection. Flaps were elevated to the clavicle superiorly, sternum medially, rectus fascias inferiorly and serratus muscle laterally. Scanning through the axilla showed no areas of increased uptake. I opened the superior axillary envelope to expose the axillary vein planning to complete an axillary dissection and found 2 blue lymphatics. One blue, cold node measuring perhaps 4 mm in diameter was identified and then a second node with 2 blue lymphatics coming into it just beginning distally in the cortex was identified approximately 1 cm in diameter just superior to this. These were sent as sentinel nodes #1 and 2 in formalin for routine histology. The breast was elevated off the pectoralis muscle taking the fascia with the specimen. The wound was irrigated with sterile water. A single Blake drain was brought out through the heel aspect of the inferior flap and a crit in place with 3-0  nylon. The skin was approximated with running 2-0 Vicryl deep dermal suture in 2 segments. Benzoin Steri-Strips were applied. The drain was placed to self suction. Telfa, fluffed gauze, Kerlix and Ace wrap was applied.  The patient tolerated the procedure well was taken to the recovery room in stable condition.  I personally took the specimen to pathology to orientate the pathologist. It appears at gross exams these are 2 separate tumors.

## 2015-11-18 NOTE — Anesthesia Preprocedure Evaluation (Signed)
Anesthesia Evaluation  Patient identified by MRN, date of birth, ID band Patient awake    Reviewed: Allergy & Precautions, H&P , NPO status , Patient's Chart, lab work & pertinent test results, reviewed documented beta blocker date and time   History of Anesthesia Complications Negative for: history of anesthetic complications  Airway Mallampati: II  TM Distance: >3 FB Neck ROM: full    Dental no notable dental hx. (+) Caps, Chipped, Teeth Intact   Pulmonary neg pulmonary ROS,    Pulmonary exam normal breath sounds clear to auscultation       Cardiovascular Exercise Tolerance: Good hypertension, On Medications and On Home Beta Blockers (-) angina+ CAD and + Past MI  (-) Cardiac Stents and (-) CABG Normal cardiovascular exam(-) dysrhythmias (-) Valvular Problems/Murmurs Rhythm:regular Rate:Normal     Neuro/Psych PSYCHIATRIC DISORDERS (Depression) negative neurological ROS     GI/Hepatic Neg liver ROS, GERD  ,  Endo/Other  neg diabetesHypothyroidism   Renal/GU CRFRenal disease  negative genitourinary   Musculoskeletal   Abdominal   Peds  Hematology negative hematology ROS (+)   Anesthesia Other Findings Past Medical History:   Menopause                                       40s            Comment:natural, hot flashes and mood lability now               gone, off prempro 7 months   hypothyroidism                                                 Comment:secondary to thyroidectomy for thyroid ca   Hypertension                                                 Myocardial infarction [facility])                     2013         Stress-induced cardiomyopathy                   September*     Comment:EF 35%. Peak troponin was 1.8.   Cancer [facility])                                                   Comment:thyroid takes levothyroxine   Osteoporosis                                                   Comment:Osteopenia   Sjoegren  syndrome ([place])                                      Rosacea  Hypothyroidism                                               Chronic kidney disease                                         Comment:UTI   Arthritis                                                    Reproductive/Obstetrics negative OB ROS                             Anesthesia Physical Anesthesia Plan  ASA: III  Anesthesia Plan: General   Post-op Pain Management:    Induction:   Airway Management Planned:   Additional Equipment:   Intra-op Plan:   Post-operative Plan:   Informed Consent: I have reviewed the patients History and Physical, chart, labs and discussed the procedure including the risks, benefits and alternatives for the proposed anesthesia with the patient or authorized representative who has indicated his/her understanding and acceptance.   Dental Advisory Given  Plan Discussed with: Anesthesiologist, CRNA and Surgeon  Anesthesia Plan Comments:         Anesthesia Quick Evaluation

## 2015-11-18 NOTE — Transfer of Care (Signed)
Immediate Anesthesia Transfer of Care Note  Patient: Kim Santiago  Procedure(s) Performed: Procedure(s): SIMPLE MASTECTOMY (Left) SENTINEL NODE BIOPSY (Left)  Patient Location: PACU  Anesthesia Type:General  Level of Consciousness: awake, alert  and oriented  Airway & Oxygen Therapy: Patient Spontanous Breathing and Patient connected to nasal cannula oxygen  Post-op Assessment: Report given to RN and Post -op Vital signs reviewed and stable  Post vital signs: Reviewed and stable  Last Vitals:  Filed Vitals:   11/18/15 1205 11/18/15 1502  BP: 137/60 113/67  Pulse: 87 81  Temp: 37 C 36.4 C  Resp: 16 16    Complications: No apparent anesthesia complications

## 2015-11-18 NOTE — Transfer of Care (Signed)
Immediate Anesthesia Transfer of Care Note  Patient: Kim Santiago  Procedure(s) Performed: Procedure(s): SIMPLE MASTECTOMY (Left) SENTINEL NODE BIOPSY (Left)  Patient Location: PACU  Anesthesia Type:General  Level of Consciousness: awake, alert  and oriented  Airway & Oxygen Therapy: Patient Spontanous Breathing  Post-op Assessment: Report given to RN and Post -op Vital signs reviewed and stable  Post vital signs: Reviewed and stable  Last Vitals:  Filed Vitals:   11/18/15 1205 11/18/15 1502  BP: 137/60 113/67  Pulse: 87 81  Temp: 37 C 36.4 C  Resp: 16 16    Complications: No apparent anesthesia complications

## 2015-11-18 NOTE — H&P (Signed)
No change in clinical history or exam.   For left mastectomy and SLN biopsy.  

## 2015-11-19 ENCOUNTER — Encounter: Payer: Self-pay | Admitting: General Surgery

## 2015-11-19 NOTE — Anesthesia Postprocedure Evaluation (Signed)
Anesthesia Post Note  Patient: Kim Santiago  Procedure(s) Performed: Procedure(s) (LRB): SIMPLE MASTECTOMY (Left) SENTINEL NODE BIOPSY (Left)  Patient location during evaluation: PACU Anesthesia Type: General Level of consciousness: awake and alert Pain management: pain level controlled Vital Signs Assessment: post-procedure vital signs reviewed and stable Respiratory status: spontaneous breathing, nonlabored ventilation, respiratory function stable and patient connected to nasal cannula oxygen Cardiovascular status: blood pressure returned to baseline and stable Postop Assessment: no signs of nausea or vomiting Anesthetic complications: no    Last Vitals:  Filed Vitals:   11/18/15 1625 11/18/15 1636  BP: 127/53 140/65  Pulse: 84 79  Temp:    Resp: 20 20    Last Pain:  Filed Vitals:   11/19/15 0857  PainSc: 4                  Martha Clan

## 2015-11-20 ENCOUNTER — Telehealth: Payer: Self-pay | Admitting: General Surgery

## 2015-11-20 NOTE — Telephone Encounter (Signed)
The patient and her daughter were notified of the pathology results.  Reports soreness and tiredness. Mild sore throat. F/U on 3/17 w/ staff for dressing change, MD f/u on 3/22.  Mammoprint candidate and formal medical oncology evaluation.

## 2015-11-22 ENCOUNTER — Ambulatory Visit (INDEPENDENT_AMBULATORY_CARE_PROVIDER_SITE_OTHER): Payer: PPO | Admitting: *Deleted

## 2015-11-22 DIAGNOSIS — C50912 Malignant neoplasm of unspecified site of left female breast: Secondary | ICD-10-CM

## 2015-11-22 NOTE — Progress Notes (Signed)
Patient came in today for a wound check left mastectomy done on 11/18/15.  The wound is clean, with no signs of infection noted. Follow up as scheduled. Rewrapped patient.

## 2015-11-26 ENCOUNTER — Ambulatory Visit (INDEPENDENT_AMBULATORY_CARE_PROVIDER_SITE_OTHER): Payer: PPO | Admitting: General Surgery

## 2015-11-26 VITALS — BP 120/76 | HR 76 | Resp 12 | Ht 64.0 in | Wt 136.0 lb

## 2015-11-26 DIAGNOSIS — C50912 Malignant neoplasm of unspecified site of left female breast: Secondary | ICD-10-CM | POA: Insufficient documentation

## 2015-11-26 NOTE — Progress Notes (Signed)
Patient ID: Kim Santiago, female   DOB: Jun 27, 1935, 81 y.o.   MRN: [id number]  Chief Complaint  Patient presents with  . Routine Post Op    left mastectomy    HPI Kim Santiago is a 81 y.o. female here today for her post op left mastectomy done on 11/18/15. Patient states she is doing well. Drain sheet present.   The patient is accompanied by one of her fellow church members as well as her daughter, both of home wewe present for the interview and exam. a  church member  I personally reviewed the patient's history. HPI  Past Medical History  Diagnosis Date  . Menopause 40s    natural, hot flashes and mood lability now gone, off prempro 7 months  . hypothyroidism     secondary to thyroidectomy for thyroid ca  . Hypertension   . Myocardial infarction ([place]) 2013  . Stress-induced cardiomyopathy September of 2013    EF 35%. Peak troponin was 1.8.  . Cancer [facility])     thyroid takes levothyroxine  . Osteoporosis     Osteopenia  . Sjoegren syndrome ([place])   . Rosacea   . Hypothyroidism   . Chronic kidney disease     UTI  . Arthritis     Past Surgical History  Procedure Laterality Date  . Kyphosis surgery  Feb 2008    L1, Dr. Mauri Pole  . Cholecystectomy    . Tonsillectomy    . Tubal ligation    . Lumbar disc surgery      L4-L5  . Shoulder arthroscopy  2004    Left, Dr. Jefm Bryant  . Thyroidectomy      Thyroid Cancer  . Spine surgery      L4-5 diskectomy  . Cardiac catheterization  05/2012    [facility]. No significant CAD. Ejection fraction of 35% due to stress-induced cardiomyopathy.  . Breast biopsy Left 10/30/15    positive, done in Dr. Dwyane Luo office  . Dilation and curettage of uterus    . Back surgery    . Simple mastectomy with axillary sentinel node biopsy Left 11/18/2015    Procedure: SIMPLE MASTECTOMY;  Surgeon: Robert Bellow, MD;  Location: [facility] ORS;  Service: General;  Laterality: Left;  . Sentinel node biopsy Left 11/18/2015    Procedure: SENTINEL NODE  BIOPSY;  Surgeon: Robert Bellow, MD;  Location: [facility] ORS;  Service: General;  Laterality: Left;    Family History  Problem Relation Age of Onset  . COPD Father   . Cancer Father     esophageal  . Kidney disease Mother   . Heart disease Mother   . Kidney disease Sister   . Cancer Brother 50    colon cancer (both brothers)    Social History Social History  Substance Use Topics  . Smoking status: Never Smoker   . Smokeless tobacco: Never Used  . Alcohol Use: No    Allergies  Allergen Reactions  . Amoxicillin Rash  . Codeine Nausea And Vomiting  . Naprosyn [Naproxen] Swelling  . Orudis [Ketoprofen] Hives  . Sulfathiazole Rash    Current Outpatient Prescriptions  Medication Sig Dispense Refill  . acetaminophen (TYLENOL) 500 MG tablet Take 500 mg by mouth every 6 (six) hours as needed for pain.    Marland Kitchen aspirin 81 MG tablet Take 81 mg by mouth daily.    . Calcium Carbonate-Vitamin D (CALCIUM + D) 600-200 MG-UNIT TABS Take 1 tablet by mouth daily.      Marland Kitchen  cholecalciferol (VITAMIN D) 1000 UNITS tablet Take 1,000 Units by mouth daily.      . fish oil-omega-3 fatty acids 1000 MG capsule Take 2 g by mouth daily.    . furosemide (LASIX) 20 MG tablet Once daily as needed for fluid retention (Patient taking differently: Take 20 mg by mouth as needed. Once daily as needed for fluid retention) 60 tablet 3  . levothyroxine (SYNTHROID, LEVOTHROID) 88 MCG tablet Take 1 tablet (88 mcg total) by mouth daily. 90 tablet 4  . losartan (COZAAR) 25 MG tablet Take 1 tablet (25 mg total) by mouth daily. 90 tablet 4  . metoprolol succinate (TOPROL XL) 25 MG 24 hr tablet Take 1 tablet (25 mg total) by mouth daily. 90 tablet 4  . Multiple Vitamins-Minerals (OSTEO COMPLEX PO) Take 1 tablet by mouth daily. Take one by mouth daily    . omeprazole (PRILOSEC) 20 MG capsule Take 1 capsule (20 mg total) by mouth daily. 90 capsule 4  . polyethylene glycol (MIRALAX / GLYCOLAX) packet Take 17 g by mouth daily.     . traMADol (ULTRAM) 50 MG tablet Take 1 tablet (50 mg total) by mouth every 4 (four) hours as needed for moderate pain or severe pain. 30 tablet 0  . triamcinolone cream (KENALOG) 0.1 % Apply 1 application topically 2 (two) times daily. 30 g 0  . vitamin B-12 (CYANOCOBALAMIN) 1000 MCG tablet Take 1,000 mcg by mouth daily.     No current facility-administered medications for this visit.    Review of Systems Review of Systems  Constitutional: Negative.   Respiratory: Negative.   Cardiovascular: Negative.   The drain was removed.  Blood pressure 120/76, pulse 76, resp. rate 12, height 5\' 4"  (1.626 m), weight 136 lb (61.689 kg).  Physical Exam Physical Exam  Pulmonary/Chest:    Full shoulder range of motion is appreciated.    Data Reviewed PT2.,N1, ER/ PR +; Her 2 neu not overexpressed  Assessment    Doing well status post left mastectomy.    Plan    Patient's tumor showed evidence of lymphovascular invasion as well as a macro metastatic disease in one of 5 lymph nodes. While Mammoprint might put her at low risk, I think formal consultation with medical oncology would be appropriate.  Arrangements have been made for the patient reevaluated by Nolon Stalls, M.D. on Wednesday, 12/04/2015 in the [facility] office.   The patient was advised that she may develop a small fluid collection as the drain is been removed, unless she is symptomatic she does not need to call.  Patient to return in one week.    PCP:  [name] This information has been scribed by Gaspar Cola CMA.     Robert Bellow 11/26/2015, 9:44 PM

## 2015-11-26 NOTE — Patient Instructions (Signed)
Patient to return on one week.

## 2015-11-28 ENCOUNTER — Ambulatory Visit: Payer: PPO | Admitting: General Surgery

## 2015-11-28 NOTE — Progress Notes (Signed)
  Oncology Nurse Navigator Documentation  Navigator Location: [facility] (11/28/15 1400) Navigator Encounter Type: Introductory phone call (11/28/15 1400)   Abnormal Finding Date: 11/11/15 (11/28/15 1400) Confirmed Diagnosis Date: 11/18/15 (11/28/15 1400) Surgery Date: 11/18/15 (11/28/15 1400)       Barriers/Navigation Needs: Education (11/28/15 1400) Education: Coping with Diagnosis/ Prognosis (11/28/15 1400) Interventions: Education Method (11/28/15 1400)     Education Method: Written;Verbal (11/28/15 1400)      Acuity: Level 1 (11/28/15 1400)         Time Spent with Patient: 30 (11/28/15 1400)  Introduced IT trainer.  Patient doing well post left mastectomy.  She has Medical Oncology appointment with Dr. Mike Gip in [place] on 329/17. Will deliver Breast Cancer Treatment Handbook/folder with hospital services to Dr. Dwyane Luo office prior to her follow-up appointment on 12/02/15.  Case Conference history collected.

## 2015-12-02 ENCOUNTER — Encounter: Payer: Self-pay | Admitting: General Surgery

## 2015-12-02 ENCOUNTER — Ambulatory Visit (INDEPENDENT_AMBULATORY_CARE_PROVIDER_SITE_OTHER): Payer: PPO | Admitting: General Surgery

## 2015-12-02 VITALS — BP 124/72 | HR 72 | Resp 12 | Ht 64.0 in | Wt 136.0 lb

## 2015-12-02 DIAGNOSIS — M19012 Primary osteoarthritis, left shoulder: Secondary | ICD-10-CM

## 2015-12-02 DIAGNOSIS — C50912 Malignant neoplasm of unspecified site of left female breast: Secondary | ICD-10-CM

## 2015-12-02 MED ORDER — MELOXICAM 7.5 MG PO TABS: 7.5000 mg | ORAL_TABLET | Freq: Every day | ORAL | Status: DC

## 2015-12-02 NOTE — Progress Notes (Signed)
Patient ID: Kim Santiago, female   DOB: [birth year], 81 y.o.   MRN: [id number]  Chief Complaint  Patient presents with  . Routine Post Op    left mastectomy    HPI Kim Santiago is a 81 y.o. female. here today for her post op left mastectomy done on 11/18/15. Patient states she is doing well. HPI  Past Medical History  Diagnosis Date  . Menopause 40s    natural, hot flashes and mood lability now gone, off prempro 7 months  . hypothyroidism     secondary to thyroidectomy for thyroid ca  . Hypertension   . Myocardial infarction ([place]) 2013  . Stress-induced cardiomyopathy September of 2013    EF 35%. Peak troponin was 1.8.  . Cancer [facility])     thyroid takes levothyroxine  . Osteoporosis     Osteopenia  . Sjoegren syndrome ([place])   . Rosacea   . Hypothyroidism   . Chronic kidney disease     UTI  . Arthritis     Past Surgical History  Procedure Laterality Date  . Kyphosis surgery  Feb 2008    L1, Dr. Mauri Pole  . Cholecystectomy    . Tonsillectomy    . Tubal ligation    . Lumbar disc surgery      L4-L5  . Shoulder arthroscopy  2004    Left, Dr. Jefm Bryant  . Thyroidectomy      Thyroid Cancer  . Spine surgery      L4-5 diskectomy  . Cardiac catheterization  05/2012    [facility]. No significant CAD. Ejection fraction of 35% due to stress-induced cardiomyopathy.  . Breast biopsy Left 10/30/15    positive, done in Dr. Dwyane Luo office  . Dilation and curettage of uterus    . Back surgery    . Simple mastectomy with axillary sentinel node biopsy Left 11/18/2015    Procedure: SIMPLE MASTECTOMY;  Surgeon: Robert Bellow, MD;  Location: [facility] ORS;  Service: General;  Laterality: Left;  . Sentinel node biopsy Left 11/18/2015    Procedure: SENTINEL NODE BIOPSY;  Surgeon: Robert Bellow, MD;  Location: [facility] ORS;  Service: General;  Laterality: Left;    Family History  Problem Relation Age of Onset  . COPD Father   . Cancer Father     esophageal  . Kidney disease Mother   .  Heart disease Mother   . Kidney disease Sister   . Cancer Brother 27    colon cancer (both brothers)    Social History Social History  Substance Use Topics  . Smoking status: Never Smoker   . Smokeless tobacco: Never Used  . Alcohol Use: No    Allergies  Allergen Reactions  . Amoxicillin Rash  . Codeine Nausea And Vomiting  . Naprosyn [Naproxen] Swelling  . Orudis [Ketoprofen] Hives  . Sulfathiazole Rash    Current Outpatient Prescriptions  Medication Sig Dispense Refill  . acetaminophen (TYLENOL) 500 MG tablet Take 500 mg by mouth every 6 (six) hours as needed for pain.    Marland Kitchen aspirin 81 MG tablet Take 81 mg by mouth daily.    . Calcium Carbonate-Vitamin D (CALCIUM + D) 600-200 MG-UNIT TABS Take 1 tablet by mouth daily.      . cholecalciferol (VITAMIN D) 1000 UNITS tablet Take 1,000 Units by mouth daily.      . fish oil-omega-3 fatty acids 1000 MG capsule Take 2 g by mouth daily.    . furosemide (LASIX) 20 MG tablet Once daily  as needed for fluid retention (Patient taking differently: Take 20 mg by mouth as needed. Once daily as needed for fluid retention) 60 tablet 3  . levothyroxine (SYNTHROID, LEVOTHROID) 88 MCG tablet Take 1 tablet (88 mcg total) by mouth daily. 90 tablet 4  . losartan (COZAAR) 25 MG tablet Take 1 tablet (25 mg total) by mouth daily. 90 tablet 4  . metoprolol succinate (TOPROL XL) 25 MG 24 hr tablet Take 1 tablet (25 mg total) by mouth daily. 90 tablet 4  . Multiple Vitamins-Minerals (OSTEO COMPLEX PO) Take 1 tablet by mouth daily. Take one by mouth daily    . omeprazole (PRILOSEC) 20 MG capsule Take 1 capsule (20 mg total) by mouth daily. 90 capsule 4  . polyethylene glycol (MIRALAX / GLYCOLAX) packet Take 17 g by mouth daily.    . traMADol (ULTRAM) 50 MG tablet Take 1 tablet (50 mg total) by mouth every 4 (four) hours as needed for moderate pain or severe pain. 30 tablet 0  . triamcinolone cream (KENALOG) 0.1 % Apply 1 application topically 2 (two) times  daily. 30 g 0  . vitamin B-12 (CYANOCOBALAMIN) 1000 MCG tablet Take 1,000 mcg by mouth daily.    . meloxicam (MOBIC) 7.5 MG tablet Take 1 tablet (7.5 mg total) by mouth daily. 30 tablet 1   No current facility-administered medications for this visit.    Review of Systems Review of Systems  Constitutional: Negative.   Respiratory: Negative.   Cardiovascular: Negative.     Blood pressure 124/72, pulse 72, resp. rate 12, height 5' 4" (1.626 m), weight 136 lb (61.689 kg).  Physical Exam Physical Exam  Constitutional: She is oriented to person, place, and time. She appears well-developed and well-nourished.  Pulmonary/Chest:    Left mastectomy site is clean and healing well.   Neurological: She is alert and oriented to person, place, and time.  Skin: Skin is warm and dry.    Data Reviewed  pT2,N1; ER/PR positive, HER-2/neu not overexpressed.  Assessment    Excellent recovery status post left mastectomy. No evidence of seroma. Full shoulder range of motion.    Plan    The patient will increase her activity as she is comfortable. Weightbearing exercises will be deferred for another 2-3 weeks.  She will be meeting with medical oncology on Wednesday, March 29. In light of the vascular invasion and a 6 mm axillary metastasis, think she will likely be a candidate for adjuvant chemotherapy.     Patient to return in 3 weeks. PCP:  [name] This information has been scribed by Gaspar Cola CMA.    Robert Bellow 12/02/2015, 8:55 PM

## 2015-12-04 ENCOUNTER — Inpatient Hospital Stay: Payer: PPO | Attending: Hematology and Oncology | Admitting: Hematology and Oncology

## 2015-12-04 VITALS — BP 146/84 | HR 80 | Temp 96.8°F | Resp 17 | Ht 64.0 in | Wt 137.1 lb

## 2015-12-04 DIAGNOSIS — Z79899 Other long term (current) drug therapy: Secondary | ICD-10-CM | POA: Diagnosis not present

## 2015-12-04 DIAGNOSIS — I129 Hypertensive chronic kidney disease with stage 1 through stage 4 chronic kidney disease, or unspecified chronic kidney disease: Secondary | ICD-10-CM | POA: Insufficient documentation

## 2015-12-04 DIAGNOSIS — Z170 Estrogen receptor positive status [ER+]: Secondary | ICD-10-CM | POA: Insufficient documentation

## 2015-12-04 DIAGNOSIS — M1990 Unspecified osteoarthritis, unspecified site: Secondary | ICD-10-CM | POA: Diagnosis not present

## 2015-12-04 DIAGNOSIS — Z7982 Long term (current) use of aspirin: Secondary | ICD-10-CM | POA: Diagnosis not present

## 2015-12-04 DIAGNOSIS — Z9012 Acquired absence of left breast and nipple: Secondary | ICD-10-CM | POA: Diagnosis not present

## 2015-12-04 DIAGNOSIS — E038 Other specified hypothyroidism: Secondary | ICD-10-CM | POA: Diagnosis not present

## 2015-12-04 DIAGNOSIS — Z9223 Personal history of estrogen therapy: Secondary | ICD-10-CM | POA: Insufficient documentation

## 2015-12-04 DIAGNOSIS — Z8051 Family history of malignant neoplasm of kidney: Secondary | ICD-10-CM | POA: Insufficient documentation

## 2015-12-04 DIAGNOSIS — I252 Old myocardial infarction: Secondary | ICD-10-CM | POA: Diagnosis not present

## 2015-12-04 DIAGNOSIS — Z85850 Personal history of malignant neoplasm of thyroid: Secondary | ICD-10-CM | POA: Diagnosis not present

## 2015-12-04 DIAGNOSIS — N189 Chronic kidney disease, unspecified: Secondary | ICD-10-CM | POA: Insufficient documentation

## 2015-12-04 DIAGNOSIS — Z780 Asymptomatic menopausal state: Secondary | ICD-10-CM | POA: Diagnosis not present

## 2015-12-04 DIAGNOSIS — Z800 Family history of malignant neoplasm of digestive organs: Secondary | ICD-10-CM | POA: Diagnosis not present

## 2015-12-04 DIAGNOSIS — D0512 Intraductal carcinoma in situ of left breast: Secondary | ICD-10-CM | POA: Diagnosis not present

## 2015-12-04 DIAGNOSIS — C50912 Malignant neoplasm of unspecified site of left female breast: Secondary | ICD-10-CM

## 2015-12-04 DIAGNOSIS — C50112 Malignant neoplasm of central portion of left female breast: Secondary | ICD-10-CM

## 2015-12-05 ENCOUNTER — Ambulatory Visit
Admission: RE | Admit: 2015-12-05 | Discharge: 2015-12-05 | Disposition: A | Discharge status: Home / Self Care | Payer: PPO | Source: Ambulatory Visit | Attending: Hematology and Oncology | Admitting: Hematology and Oncology

## 2015-12-05 DIAGNOSIS — C50912 Malignant neoplasm of unspecified site of left female breast: Secondary | ICD-10-CM | POA: Insufficient documentation

## 2015-12-05 DIAGNOSIS — M818 Other osteoporosis without current pathological fracture: Secondary | ICD-10-CM

## 2015-12-05 DIAGNOSIS — M8580 Other specified disorders of bone density and structure, unspecified site: Secondary | ICD-10-CM | POA: Insufficient documentation

## 2015-12-05 DIAGNOSIS — M85852 Other specified disorders of bone density and structure, left thigh: Secondary | ICD-10-CM | POA: Diagnosis not present

## 2015-12-05 DIAGNOSIS — Z780 Asymptomatic menopausal state: Secondary | ICD-10-CM | POA: Diagnosis not present

## 2015-12-11 ENCOUNTER — Inpatient Hospital Stay: Payer: PPO | Attending: Hematology and Oncology | Admitting: Hematology and Oncology

## 2015-12-11 VITALS — BP 159/97 | HR 75 | Temp 96.4°F | Resp 18 | Ht 64.0 in | Wt 137.7 lb

## 2015-12-11 DIAGNOSIS — N189 Chronic kidney disease, unspecified: Secondary | ICD-10-CM | POA: Insufficient documentation

## 2015-12-11 DIAGNOSIS — D0512 Intraductal carcinoma in situ of left breast: Secondary | ICD-10-CM | POA: Diagnosis not present

## 2015-12-11 DIAGNOSIS — M8580 Other specified disorders of bone density and structure, unspecified site: Secondary | ICD-10-CM | POA: Diagnosis not present

## 2015-12-11 DIAGNOSIS — Z9012 Acquired absence of left breast and nipple: Secondary | ICD-10-CM | POA: Diagnosis not present

## 2015-12-11 DIAGNOSIS — C73 Malignant neoplasm of thyroid gland: Secondary | ICD-10-CM

## 2015-12-11 DIAGNOSIS — K5900 Constipation, unspecified: Secondary | ICD-10-CM | POA: Insufficient documentation

## 2015-12-11 DIAGNOSIS — M1990 Unspecified osteoarthritis, unspecified site: Secondary | ICD-10-CM | POA: Insufficient documentation

## 2015-12-11 DIAGNOSIS — Z85850 Personal history of malignant neoplasm of thyroid: Secondary | ICD-10-CM | POA: Diagnosis not present

## 2015-12-11 DIAGNOSIS — I252 Old myocardial infarction: Secondary | ICD-10-CM | POA: Insufficient documentation

## 2015-12-11 DIAGNOSIS — Z7982 Long term (current) use of aspirin: Secondary | ICD-10-CM | POA: Diagnosis not present

## 2015-12-11 DIAGNOSIS — I129 Hypertensive chronic kidney disease with stage 1 through stage 4 chronic kidney disease, or unspecified chronic kidney disease: Secondary | ICD-10-CM | POA: Insufficient documentation

## 2015-12-11 DIAGNOSIS — E039 Hypothyroidism, unspecified: Secondary | ICD-10-CM | POA: Diagnosis not present

## 2015-12-11 DIAGNOSIS — C773 Secondary and unspecified malignant neoplasm of axilla and upper limb lymph nodes: Secondary | ICD-10-CM | POA: Insufficient documentation

## 2015-12-11 DIAGNOSIS — Z780 Asymptomatic menopausal state: Secondary | ICD-10-CM | POA: Insufficient documentation

## 2015-12-11 DIAGNOSIS — C50112 Malignant neoplasm of central portion of left female breast: Secondary | ICD-10-CM

## 2015-12-11 DIAGNOSIS — Z79899 Other long term (current) drug therapy: Secondary | ICD-10-CM | POA: Diagnosis not present

## 2015-12-11 DIAGNOSIS — Z170 Estrogen receptor positive status [ER+]: Secondary | ICD-10-CM | POA: Diagnosis not present

## 2015-12-11 DIAGNOSIS — E890 Postprocedural hypothyroidism: Secondary | ICD-10-CM | POA: Diagnosis not present

## 2015-12-11 NOTE — Progress Notes (Signed)
Pt still struggles with constipation.  No other major concerns

## 2015-12-11 NOTE — Progress Notes (Signed)
[facility]-  Cancer Center  Clinic day:  12/04/2015  Chief Complaint: Kim Santiago is a 81 y.o. female with stage IIB left breast cancer who is referred in consultation by Dr. Donnalee Curry for assessment and management.  HPI:  The patient describes a hard crusty left nipple beginning in 09/2015.  She was initially treated with antibiotics with some improvement.  Mammogram on 01/15/2015 revealed no evidence of malignancy.  She was seen by Dr. Donnalee Curry on 10/30/2015.  Exam revealed a 1.5 cm area of thickening of the areolar skin extending from the base of the nipple to the edge of the areola.  There was some distortion of the nipple above this area.  There was no palpable adenopathy.  She underwent left areolar punch biopsy on 10/30/2015.  Pathology revealed infiltrating carcinoma consistent with a breast primary.  Tumor was estrogen receptor positive (100%), progesterone receptor positive (70%), and Her2/neu negative.  Left breast needle core biopsy at the 1 o'clock position revealed grade I invasive mammary carcinoma (ductal phenotype favored).  The patient underwent left mastectomy on 11/07/2015.  Pathology revealed a 3.9 cm grade II invasive mammary carcinoma.  There was ductal carcinoma in situ (DCIS).  There was lymphvascular and dermal lymphatic invasion.  Margins were negative.  One of 2 sentinel lymph nodes were positive for macrometastasis.  In addition, there were 3 benign axillary lymph nodes.  Pathologic stage was T2N1aM0 (stage IIB).  She had menses at age 45.  She was never pregnant.  She was never on birth control pills.  She went into menopause in her 69s.  She was on hormone replacement therapy for 1 year.  There is no family history of breast or ovarian cancer.  Her father had esophageal cancer.  A brother had throat cancer, another brother had colon cancer, and another brother had prostate cancer.  The patient has a history of a myocardial  infarction in 06/2012.  Echocardiogram revealed an EF of 35%.  Patient is unaware of a history of heart failure.  Epic notes indicate stress-induced cardiomyopathy.  She was previously seen by Dr. Kirke Corin, cardiologist.  She has a history of thyroid carcinoma 40 years ago treated with I-131.  Symptomatically, she feels pretty well.  She is healing well from surgery.   Past Medical History  Diagnosis Date  . Menopause 40s    natural, hot flashes and mood lability now gone, off prempro 7 months  . hypothyroidism     secondary to thyroidectomy for thyroid ca  . Hypertension   . Myocardial infarction (HCC) 2013  . Stress-induced cardiomyopathy September of 2013    EF 35%. Peak troponin was 1.8.  . Osteoporosis     Osteopenia  . Sjoegren syndrome (HCC)   . Rosacea   . Hypothyroidism   . Chronic kidney disease     UTI  . Arthritis   . Cancer [facility])     thyroid takes levothyroxine    Past Surgical History  Procedure Laterality Date  . Kyphosis surgery  Feb 2008    L1, Dr. Gerrit Heck  . Cholecystectomy    . Tonsillectomy    . Tubal ligation    . Lumbar disc surgery      L4-L5  . Shoulder arthroscopy  2004    Left, Dr. Gavin Potters  . Thyroidectomy      Thyroid Cancer  . Spine surgery      L4-5 diskectomy  . Cardiac catheterization  05/2012    [facility]. No significant  CAD. Ejection fraction of 35% due to stress-induced cardiomyopathy.  . Dilation and curettage of uterus    . Back surgery    . Simple mastectomy with axillary sentinel node biopsy Left 11/18/2015    Procedure: SIMPLE MASTECTOMY;  Surgeon: Robert Bellow, MD;  Location: [facility] ORS;  Service: General;  Laterality: Left;  . Sentinel node biopsy Left 11/18/2015    Procedure: SENTINEL NODE BIOPSY;  Surgeon: Robert Bellow, MD;  Location: [facility] ORS;  Service: General;  Laterality: Left;  . Breast biopsy Left 10/30/15    positive, done in Dr. Dwyane Luo office    Family History  Problem Relation Age of Onset  . COPD Father   .  Cancer Father     esophageal  . Kidney disease Mother   . Heart disease Mother   . Kidney disease Sister   . Cancer Brother 47    colon cancer (both brothers)    Social History:  reports that she has never smoked. She has never used smokeless tobacco. She reports that she does not drink alcohol or use illicit drugs.  She is from [place], [place].  She was married for 60 years.  Her husband died 48 months ago.  She lives alone in [place].  She has an adopted daughter and 2 grandchildren.  The patient is accompanied by her daughter, Kim Santiago, and her friend, Kim Santiago, today.  Allergies:  Allergies  Allergen Reactions  . Amoxicillin Rash  . Codeine Nausea And Vomiting  . Naprosyn [Naproxen] Swelling  . Orudis [Ketoprofen] Hives  . Sulfathiazole Rash    Current Medications: Current Outpatient Prescriptions  Medication Sig Dispense Refill  . acetaminophen (TYLENOL) 500 MG tablet Take 500 mg by mouth every 6 (six) hours as needed for pain.    Marland Kitchen aspirin 81 MG tablet Take 81 mg by mouth daily.    . Calcium Carbonate-Vitamin D (CALCIUM + D) 600-200 MG-UNIT TABS Take 1 tablet by mouth daily.      . cholecalciferol (VITAMIN D) 1000 UNITS tablet Take 1,000 Units by mouth daily.      . fish oil-omega-3 fatty acids 1000 MG capsule Take 2 g by mouth daily.    . furosemide (LASIX) 20 MG tablet Once daily as needed for fluid retention (Patient taking differently: Take 20 mg by mouth as needed. Once daily as needed for fluid retention) 60 tablet 3  . levothyroxine (SYNTHROID, LEVOTHROID) 88 MCG tablet Take 1 tablet (88 mcg total) by mouth daily. 90 tablet 4  . losartan (COZAAR) 25 MG tablet Take 1 tablet (25 mg total) by mouth daily. 90 tablet 4  . meloxicam (MOBIC) 7.5 MG tablet Take 1 tablet (7.5 mg total) by mouth daily. 30 tablet 1  . metoprolol succinate (TOPROL XL) 25 MG 24 hr tablet Take 1 tablet (25 mg total) by mouth daily. 90 tablet 4  . Multiple Vitamins-Minerals (OSTEO COMPLEX PO) Take 1  tablet by mouth daily. Take one by mouth daily    . omeprazole (PRILOSEC) 20 MG capsule Take 1 capsule (20 mg total) by mouth daily. 90 capsule 4  . polyethylene glycol (MIRALAX / GLYCOLAX) packet Take 17 g by mouth daily.    . traMADol (ULTRAM) 50 MG tablet Take 1 tablet (50 mg total) by mouth every 4 (four) hours as needed for moderate pain or severe pain. 30 tablet 0  . triamcinolone cream (KENALOG) 0.1 % Apply 1 application topically 2 (two) times daily. 30 g 0  . vitamin B-12 (CYANOCOBALAMIN) 1000 MCG tablet  Take 1,000 mcg by mouth daily.     No current facility-administered medications for this visit.    Review of Systems:  GENERAL:  Feels "pretty well".  Active.  No fevers, sweats or weight loss. PERFORMANCE STATUS (ECOG):  1 HEENT:  No visual changes, runny nose, sore throat, mouth sores or tenderness. Lungs: No shortness of breath or cough.  No hemoptysis. Cardiac:  No chest pain, palpitations, orthopnea, or PND. GI:  No nausea, vomiting, diarrhea, constipation, melena or hematochezia. GU:  No urgency, frequency, dysuria, or hematuria. Musculoskeletal:  No back pain.  "Knees bother me".  No muscle tenderness. Extremities:  No pain or swelling. Skin:  Healing well from surgery.  No rashes or skin changes. Neuro:  No headache, numbness or weakness, balance or coordination issues. Endocrine:  No diabetes.  Thyroid disease on Synthroid.  No hot flashes or night sweats. Psych:  No mood changes, depression or anxiety. Pain:  No focal pain. Review of systems:  All other systems reviewed and found to be negative.  Physical Exam: Blood pressure 146/84, pulse 80, temperature 96.8 F (36 C), temperature source Tympanic, resp. rate 17, height 5' 4"  (1.626 m), weight 137 lb 2 oz (62.2 kg). GENERAL:  Well developed, well nourished, woman sitting comfortably in the exam room in no acute distress. MENTAL STATUS:  Alert and oriented to person, place and time. HEAD:  Short gray hair.   Normocephalic, atraumatic, face symmetric, no Cushingoid features. EYES:  Blue/brown eyes.  Pupils equal round and reactive to light and accomodation.  No conjunctivitis or scleral icterus. ENT:  Oropharynx clear without lesion.  Tongue normal. Mucous membranes moist.  RESPIRATORY:  Clear to auscultation without rales, wheezes or rhonchi. CARDIOVASCULAR:  Regular rate and rhythm without murmur, rub or gallop. No JVD. BREASTS:  Left mastectomy with well healing incision.  No erythema.  Right breast without masses, skin changes or nipple discharge.  ABDOMEN:  Soft, non-tender, with active bowel sounds, and no hepatosplenomegaly.  No masses. SKIN:  No rashes, ulcers or lesions. EXTREMITIES: No edema, no skin discoloration or tenderness.  No palpable cords. LYMPH NODES: No palpable cervical, supraclavicular, axillary or inguinal adenopathy  NEUROLOGICAL: Unremarkable. PSYCH:  Appropriate.  No visits with results within 3 Day(s) from this visit. Latest known visit with results is:  Admission on 11/18/2015, Discharged on 11/18/2015  Component Date Value Ref Range Status  . SURGICAL PATHOLOGY 11/18/2015    Final                   Value:Surgical Pathology CASE: [id number] PATIENT: [facility] Surgical Pathology Report     SPECIMEN SUBMITTED: A. Breast, left B. Sentinel lymph node #1  #2  CLINICAL HISTORY: None provided  PRE-OPERATIVE DIAGNOSIS: Left breast cancer  POST-OPERATIVE DIAGNOSIS: Same as pre-op     DIAGNOSIS: A. LEFT BREAST; TOTAL MASTECTOMY: - SOLITARY INVASIVE MAMMARY CARCINOMA OF NO SPECIAL TYPE, 3.9 CM. - DUCTAL CARCINOMA IN SITU. - LYMPH VASCULAR AND DERMAL LYMPHATIC INVASION IDENTIFIED. - THE SURGICAL MARGINS ARE NEGATIVE. - MICROCALCIFICATIONS IN BENIGN AND MALIGNANT TISSUE. - THREE BENIGN AXILLARY LYMPH NODES (0/3). - SEE SUMMARY BELOW.  B. SENTINEL LYMPH NODE #1 AND 2; EXCISION: - ONE OF TWO LYMPH NODES POSITIVE FOR MACRO METASTASES  (1/2).   INVASIVE CARCINOMA OF THE BREAST: Complete Excision Specimens InvolvedA: Breast, left B: Sentinel lymph node #1  #2  SPECIMEN Procedure:     Total mastectomy (including nipple and skin) Lymph Node Sampling:     S  entinel lymph node(s) Specimen Laterality:     Left TUMOR Presence of Invasive Carcinoma:    Histologic Type: Invasive mammary carcinoma of no special type (ductal, not otherwise specified) Histologic Grade:   Glandular (acinar) / tubular differentiation: Score 3 (< 10% of tumor area forming glandular / tubular structures) Nuclear pleomorphism: Score 2 (cells larger than normal with open vesicular nuclei, visible nucleoli, and moderate variability in both size and shape) Mitotic rate:  Score 1 (<=3 mitoses per mm2) Overall grade: Grade 2 (scores of 6 or 7) Ductal Carcinoma In Situ (DCIS):   DCIS is present Negative for extensive intraductal component (EIC) Tumor Size / Focality:   Tumor size: size of largest invasive carcinoma: Greatest dimension of largest focus of invasion > 1 mm: 27m Tumor focality:     Single focus of invasive carcinoma Tumor Extent Macroscopic and Microscopic Extent of Tumor Skin:     Skin invasion: Invasive carcinoma directly invades into t                         he dermis or epidermis without skin ulceration (this does not change the T stage) Skin satellite foci:     Negative for satellite foci Nipple DCIS:   DCIS does not involve the nipple epidermis Skeletal muscle:    Specimen does not contain skeletal muscle Accessory Tumor Findings Lymph-Vascular Invasion: Present Treatment Effect: Response to Presurgical (Neoadjuvant) Therapy: No known presurgical therapy MARGINS Invasive Carcinoma: Margins negative for invasive carcinoma Distance from closest margin: Distance is > 10 mm Closest Negative Margin(s):   Posterior Ductal Carcinoma In Situ (DCIS):   Margins negative for DCIS (DCIS present in  specimen) Distance of DCIS from closest margin:   Distance is > 10 mm Closest Negative Margin(s):   Posterior LYMPH NODES Regional Lymph Nodes:    Number of Sentinel Nodes Examined: Specify number 2 Number of lymph node(s) examined:  Number 5 Lymph node involvement:  Number of lymph nodes with macrometastases (> 2 mm): Number 1 N                         umber of lymph nodes with micrometastases: Number 0 Number of lymph nodes with isolated tumor cells (<= 0.2 mm and <= 200 cells):   Number 0 STAGE (pTNM) TNM Descriptors:    Not applicable n/a Primary Tumor (Invasive Carcinoma) (pT):     pT2:  tumor > 20 mm but <= 50 mm in greatest dimension Regional lymph nodes (pN) (choose a category based on lymph nodes received with the specimen;  immunohistochemistry and / or molecular studies are not required) Category (pN): pN1a: metastases in 1 to 3 axillary lymph nodes, at least 1 metastasis greater than 2 mm Distant Metastasis (pM): Not applicable - pM cannot be determined from the submitted specimen(s) Comment(s): Invasive carcinoma is identified in tissue between the two masses. The tumor is entirely submitted and is present in 13 slides.   GROSS DESCRIPTION: A. Labeled: Left breast  Time in fixative: 3:10 PM on 11/18/2015  Cold ischemic time: 40 minutes  Total fixation time: 6 1/2 Hours  Type of mastectomy:                          Simple  Laterality: Left  Weight of specimen: 707 grams  Size of specimen: 23.5 x 20.5 x 3.9 cm  Orientation of specimen: The letters M and L  are written in surgical marker on the skin surface to designate the medial and lateral aspects specimen. Anterior half specimen inked blue, posterior specimen inked black.  Skin ellipse dimensions  description: 15.5 x 11.2 cm, unremarkable  Nipple/ areola: Nipple 1 cm in diameter, flattened. Areola 4.3 cm in diameter, unremarkable  Axillary tail: Absent  Biopsy site(s): 2 discrete biopsy  sites identified  Number of discrete masses: 2  Location of mass(es): One mass is located approximately at the 6:00 position behind the nipple areolar complex, and the other is located in the approximate 1:00 position in the upper outer quadrant.  Distance between masses: Approximately 1.8 cm  Size of mass(es)/biopsy site(s): First mass approximately 1 x 0.9 x 0.5 cm, and second mass approximately 1 x 0.7 x 0.8 cm  Description of                          mass(es)/biopsy site(s): The first mass is surrounded by a large amount of fibrous tissue measures approximately 2.5 x 2.5 x 1.5 cm. Both the first and second masses are extremely poorly defined, stellate, pale tan to white.  Margins: The first mass is grossly located 3.5 cm from the deep margin and the second mass is grossly located 3 cm from the deep margin  Gross involvement of skin/fascia/muscle by tumor: Absent  Description of remaining breast: The rest the breast tissue is predominantly fatty with interspersed areas of dense white fibrous tissue  Lymph nodes: On dissection 3 lymph node candidates identified from 0.5 cm to 1.2 x 0.8 x 0.5 cm.  Block Summary: 1-skin 2-nipple 3-deep margin of resection 4-9 entire first mass including biopsy cavity and adjacent fibrous tissue at the left breast mass 10-12-entire second mass 13-16-breast tissue between first and second mass 17-representative section upper inner quadrant 18-representative section lower inner                          quadrant 19-representative section upper outer quadrant 20-representative section lower outer quadrant 21- 2 lymph node candidates submitted on 11/20/15 22-1 sectioned lymph node candidate submitted on 11/20/15  B. Labeled: Sentinel node #1 and 2  Tissue fragment(s): 2  Size: 0.4 x 0.3 x 0.2 cm and 1.2 x 0.6 x 0.6 cm  Description: 2 pink red lymph node candidates covered by adipose tissue, smaller inked blue  Entirely submitted in one  cassette(s).        Final Diagnosis performed by Delorse Lek, MD.  Electronically signed 11/21/2015 2:59:43PM    The electronic signature indicates that the named Attending Pathologist has evaluated the specimen  Technical component performed at [facility], [address] [place], [place] [address] Lab: [phone] Dir: Darrick Penna. Evette Doffing, MD  Professional component performed at [facility], [facility], [place], [place], [place] [address] Lab: [phone] Dir: Dellia Nims. Rubinas, MD      Assessment:  JAMIEE MILHOLLAND is a 81 y.o. female with stage IIB left breast cancer s/p mastectomy on 11/07/2015.  Pathology revealed a 3.9 cm grade II invasive mammary carcinoma.  There was ductal carcinoma in situ (DCIS).  There was lymphvascular and dermal lymphatic invasion.  Margins were negative.  One of 2 sentinel lymph nodes were positive for macrometastasis.  In addition, there were 3 benign axillary lymph nodes (total 5 lymph nodes).  Tumor was estrogen receptor positive (100%), progesterone receptor positive (70%), and Her2/neu negative.  Pathologic stage was T2N1a.  CA27.29 was 30.4 on 11/15/2015.  She has a history of a myocardial infarction in 06/2012.  Echocardiogram revealed an EF of 35%.  Patient is unaware of a history of heart failure.   She has a history of thyroid carcinoma 40 years ago treated with I-131.   Symptomatically, she feels well.  Exam reveals a well healing mastectomy incision.  Plan: 1.  Review diagnosis, staging, and management of breast cancer.  Discuss multi-disciplinary approach to breast cancer.  Patient has undergone mastectomy.  We discussed hormonal therapy and chemotherapy.  I suspect she will not need radiation as she has had a mastectomy with one sentinel lymph node positive (> 2 mm), but will review with Dr. Baruch Gouty for confirmation. Discuss tumor is hormone receptor positive and thus treatment will include at least 5 years of hormonal  therapy (tamoxifen versus an aromatase inhibitor).  Discuss side effects of tamoxifen versus aromatase inhibitors.  Discuss plan for an aromatase inhibitor.  Discuss baseline bone density.  Discuss chemotherapy in detail.  Discuss treatment is individualized in patients greater than 70.  Discuss prior MI and ejection fraction of 35% (patient unaware).  Discuss second and third generation chemotherapy (4 cycles of Taxotere and Cytoxan versus AC followed by Taxol).  Given patient's age and cardiac history, would avoid an anthracycline.  Discuss side effects of Taxotere and Cytoxan in detail.   Discuss NCCN guidelines.  Discuss Oncotype DX testing as well as Mammoprint.  For Oncotype DX,  current NCCN guidelines suggest use of 21 gene RT-PCR assay for T1-T3 tumors and N0 or N76m. Given patient's macrometastasis and lymphovascular invasion, lean toward chemotherapy (4 cycles of TC) followed by an aromatase inhibitor.  As greatest benefit is likely hormonal therapy, discuss obtaining additional information from [employer] (or other benefit-risk calculator such as [employer]), given her age and prior medical history.  2.  Schedule bone density study. 3.  RTC in 1 week for review of testing and risk based calculator estimate.   MLequita Asal MD  12/04/2015

## 2015-12-12 ENCOUNTER — Encounter: Payer: Self-pay | Admitting: *Deleted

## 2015-12-12 ENCOUNTER — Telehealth: Payer: Self-pay | Admitting: Hematology and Oncology

## 2015-12-12 NOTE — Telephone Encounter (Signed)
Patient has had second thoughts about doing test discussed yesterday. She would like to discuss with Dr. Mike Gip or her nurse. Please call: [phone].

## 2015-12-12 NOTE — Telephone Encounter (Signed)
Pt informed that the specimen has already been sent, she voiced concerns over the cost adn I told her that they will check with her insurance and make sure it pays before performing the test

## 2015-12-12 NOTE — Progress Notes (Signed)
Mammaprint has been ordered this morning per Dr. Mike Gip on most recent pathology report.

## 2015-12-17 ENCOUNTER — Encounter: Payer: Self-pay | Admitting: General Surgery

## 2015-12-23 ENCOUNTER — Ambulatory Visit: Payer: PPO | Admitting: General Surgery

## 2015-12-23 ENCOUNTER — Ambulatory Visit: Payer: PPO | Admitting: Internal Medicine

## 2015-12-24 ENCOUNTER — Encounter: Payer: Self-pay | Admitting: Hematology and Oncology

## 2015-12-24 LAB — SURGICAL PATHOLOGY

## 2015-12-25 ENCOUNTER — Ambulatory Visit: Payer: PPO | Admitting: Hematology and Oncology

## 2015-12-26 ENCOUNTER — Ambulatory Visit (INDEPENDENT_AMBULATORY_CARE_PROVIDER_SITE_OTHER): Payer: PPO | Admitting: Internal Medicine

## 2015-12-26 ENCOUNTER — Encounter: Payer: Self-pay | Admitting: Internal Medicine

## 2015-12-26 VITALS — BP 132/64 | HR 78 | Temp 98.1°F | Resp 12 | Ht 64.0 in | Wt 135.0 lb

## 2015-12-26 DIAGNOSIS — R600 Localized edema: Secondary | ICD-10-CM

## 2015-12-26 DIAGNOSIS — R5383 Other fatigue: Secondary | ICD-10-CM | POA: Diagnosis not present

## 2015-12-26 DIAGNOSIS — E785 Hyperlipidemia, unspecified: Secondary | ICD-10-CM | POA: Diagnosis not present

## 2015-12-26 DIAGNOSIS — C50912 Malignant neoplasm of unspecified site of left female breast: Secondary | ICD-10-CM

## 2015-12-26 DIAGNOSIS — E559 Vitamin D deficiency, unspecified: Secondary | ICD-10-CM | POA: Diagnosis not present

## 2015-12-26 DIAGNOSIS — C50122 Malignant neoplasm of central portion of left male breast: Secondary | ICD-10-CM | POA: Diagnosis not present

## 2015-12-26 DIAGNOSIS — Z1159 Encounter for screening for other viral diseases: Secondary | ICD-10-CM

## 2015-12-26 DIAGNOSIS — F4321 Adjustment disorder with depressed mood: Secondary | ICD-10-CM

## 2015-12-26 DIAGNOSIS — Z4432 Encounter for fitting and adjustment of external left breast prosthesis: Secondary | ICD-10-CM | POA: Diagnosis not present

## 2015-12-26 LAB — CBC WITH DIFFERENTIAL/PLATELET
Basophils Absolute: 0 10*3/uL (ref 0.0–0.1)
Basophils Relative: 1 % (ref 0.0–3.0)
EOS ABS: 0.1 10*3/uL (ref 0.0–0.7)
Eosinophils Relative: 3.2 % (ref 0.0–5.0)
HCT: 35.7 % — ABNORMAL LOW (ref 36.0–46.0)
HEMOGLOBIN: 12.1 g/dL (ref 12.0–15.0)
Lymphocytes Relative: 28.6 % (ref 12.0–46.0)
Lymphs Abs: 1.1 10*3/uL (ref 0.7–4.0)
MCHC: 34 g/dL (ref 30.0–36.0)
MCV: 93.5 fl (ref 78.0–100.0)
MONO ABS: 0.4 10*3/uL (ref 0.1–1.0)
Monocytes Relative: 9.8 % (ref 3.0–12.0)
Neutro Abs: 2.2 10*3/uL (ref 1.4–7.7)
Neutrophils Relative %: 57.4 % (ref 43.0–77.0)
Platelets: 206 10*3/uL (ref 150.0–400.0)
RBC: 3.82 Mil/uL — AB (ref 3.87–5.11)
RDW: 14.6 % (ref 11.5–15.5)
WBC: 3.9 10*3/uL — AB (ref 4.0–10.5)

## 2015-12-26 LAB — COMPREHENSIVE METABOLIC PANEL
ALBUMIN: 4 g/dL (ref 3.5–5.2)
ALT: 6 U/L (ref 0–35)
AST: 16 U/L (ref 0–37)
Alkaline Phosphatase: 82 U/L (ref 39–117)
BILIRUBIN TOTAL: 0.6 mg/dL (ref 0.2–1.2)
BUN: 16 mg/dL (ref 6–23)
CALCIUM: 9.4 mg/dL (ref 8.4–10.5)
CO2: 31 mEq/L (ref 19–32)
CREATININE: 0.66 mg/dL (ref 0.40–1.20)
Chloride: 100 mEq/L (ref 96–112)
GFR: 91.83 mL/min (ref >60.00)
Glucose, Bld: 97 mg/dL (ref 70–99)
Potassium: 5.1 mEq/L (ref 3.5–5.1)
Sodium: 136 mEq/L (ref 135–145)
Total Protein: 7 g/dL (ref 6.0–8.3)

## 2015-12-26 LAB — LIPID PANEL
CHOLESTEROL: 160 mg/dL (ref 0–200)
HDL: 45.2 mg/dL (ref >39.00)
LDL CALC: 97 mg/dL (ref 0–99)
NonHDL: 114.87
TRIGLYCERIDES: 88 mg/dL (ref 0.0–149.0)
Total CHOL/HDL Ratio: 4
VLDL: 17.6 mg/dL (ref 0.0–40.0)

## 2015-12-26 LAB — TSH: TSH: 3.25 u[IU]/mL (ref 0.35–4.50)

## 2015-12-26 LAB — VITAMIN D 25 HYDROXY (VIT D DEFICIENCY, FRACTURES): VITD: 49.89 ng/mL (ref 30.00–100.00)

## 2015-12-26 MED ORDER — FUROSEMIDE 20 MG PO TABS: ORAL_TABLET | ORAL | Status: DC

## 2015-12-26 NOTE — Patient Instructions (Signed)
You are doing amazingly well!  If you develop any trouble sleeping , please let me know   I will see you in 3 months

## 2015-12-26 NOTE — Progress Notes (Signed)
Subjective:  Patient ID: Kim Santiago, female    DOB: [birth year]  Age: 81 y.o. MRN: [id number]  CC: The primary encounter diagnosis was Localized edema. Diagnoses of Other fatigue, Vitamin D deficiency, Hyperlipidemia, Need for hepatitis C screening test, Grief, and Breast cancer, female, left were also pertinent to this visit.  HPI [name] presents for follow up on chronic conditions.  She was diagnosed with breast cancer in Feb and s/p simple mastectomy of the left breast was done by [facility]. Plans for oncology follow up  were postponed by brother's untimely death last week.  She is grieving his loss , along with the death of her husband last year.   She has been given for chemotherapy vs adjuvant therapy . She has decided to forego chemo,  mammaprinte was low risk.  Adjuvant therapy anticipated.  She feels generally well and is confident in her decision .     Outpatient Prescriptions Prior to Visit  Medication Sig Dispense Refill  . acetaminophen (TYLENOL) 650 MG CR tablet Take 650 mg by mouth every 8 (eight) hours as needed for pain.    Marland Kitchen aspirin 81 MG tablet Take 81 mg by mouth daily.    . Calcium Carbonate-Vitamin D (CALCIUM + D) 600-200 MG-UNIT TABS Take 1 tablet by mouth daily.      . cholecalciferol (VITAMIN D) 1000 UNITS tablet Take 1,000 Units by mouth daily.      . fish oil-omega-3 fatty acids 1000 MG capsule Take 2 g by mouth daily.    Marland Kitchen levothyroxine (SYNTHROID, LEVOTHROID) 88 MCG tablet Take 1 tablet (88 mcg total) by mouth daily. 90 tablet 4  . losartan (COZAAR) 25 MG tablet Take 1 tablet (25 mg total) by mouth daily. 90 tablet 4  . meloxicam (MOBIC) 7.5 MG tablet Take 1 tablet (7.5 mg total) by mouth daily. 30 tablet 1  . metoprolol succinate (TOPROL XL) 25 MG 24 hr tablet Take 1 tablet (25 mg total) by mouth daily. 90 tablet 4  . Multiple Vitamins-Minerals (OSTEO COMPLEX PO) Take 1 tablet by mouth daily. Take one by mouth daily    . omeprazole (PRILOSEC) 20 MG  capsule Take 1 capsule (20 mg total) by mouth daily. 90 capsule 4  . polyethylene glycol (MIRALAX / GLYCOLAX) packet Take 17 g by mouth daily.    . traMADol (ULTRAM) 50 MG tablet Take 1 tablet (50 mg total) by mouth every 4 (four) hours as needed for moderate pain or severe pain. 30 tablet 0  . triamcinolone cream (KENALOG) 0.1 % Apply 1 application topically 2 (two) times daily. 30 g 0  . vitamin B-12 (CYANOCOBALAMIN) 1000 MCG tablet Take 1,000 mcg by mouth daily.    . furosemide (LASIX) 20 MG tablet Once daily as needed for fluid retention (Patient taking differently: Take 20 mg by mouth as needed. Once daily as needed for fluid retention) 60 tablet 3   No facility-administered medications prior to visit.    Review of Systems;  Patient denies headache, fevers, malaise, unintentional weight loss, skin rash, eye pain, sinus congestion and sinus pain, sore throat, dysphagia,  hemoptysis , cough, dyspnea, wheezing, chest pain, palpitations, orthopnea, edema, abdominal pain, nausea, melena, diarrhea, constipation, flank pain, dysuria, hematuria, urinary  Frequency, nocturia, numbness, tingling, seizures,  Focal weakness, Loss of consciousness,  Tremor, insomnia, depression, anxiety, and suicidal ideation.      Objective:  BP 132/64 mmHg  Pulse 78  Temp(Src) 98.1 F (36.7 C) (Oral)  Resp 12  Ht 5\' 4"  (1.626 m)  Wt 135 lb (61.236 kg)  BMI 23.16 kg/m2  SpO2 99%  BP Readings from Last 3 Encounters:  12/26/15 132/64  12/11/15 159/97  12/04/15 146/84    Wt Readings from Last 3 Encounters:  12/26/15 135 lb (61.236 kg)  12/11/15 137 lb 10.8 oz (62.45 kg)  12/04/15 137 lb 2 oz (62.2 kg)   Physical Exam:   General appearance: alert, cooperative and appears stated age Ears: normal TM's and external ear canals both ears Throat: lips, mucosa, and tongue normal; teeth and gums normal Neck: no adenopathy, no carotid bruit, supple, symmetrical, trachea midline and thyroid not enlarged,  symmetric, no tenderness/mass/nodules Back: symmetric, no curvature. ROM normal. No CVA tenderness. Lungs: clear to auscultation bilaterally Chest: simple left mastectomy with excess tissue at medial border. Surgical incision well healed.  Heart: regular rate and rhythm, S1, S2 normal, no murmur, click, rub or gallop Abdomen: soft, non-tender; bowel sounds normal; no masses,  no organomegaly Pulses: 2+ and symmetric Skin: Skin color, texture, turgor normal. No rashes or lesions Lymph nodes: Cervical, supraclavicular, and axillary nodes normal.  No results found for: HGBA1C  Lab Results  Component Value Date   CREATININE 0.66 12/26/2015   CREATININE 0.63 11/15/2015   CREATININE 0.73 05/06/2015    Lab Results  Component Value Date   WBC 3.9* 12/26/2015   HGB 12.1 12/26/2015   HCT 35.7* 12/26/2015   PLT 206.0 12/26/2015   GLUCOSE 97 12/26/2015   CHOL 160 12/26/2015   TRIG 88.0 12/26/2015   HDL 45.20 12/26/2015   LDLDIRECT 117.6 12/27/2012   LDLCALC 97 12/26/2015   ALT 6 12/26/2015   AST 16 12/26/2015   NA 136 12/26/2015   K 5.1 12/26/2015   CL 100 12/26/2015   CREATININE 0.66 12/26/2015   BUN 16 12/26/2015   CO2 31 12/26/2015   TSH 3.25 12/26/2015    Dg Bone Density  12/05/2015  EXAM: DUAL X-RAY ABSORPTIOMETRY (DXA) FOR BONE MINERAL DENSITY IMPRESSION: Dear Dr Nolon Stalls, Your patient [name] completed a BMD test on 12/05/2015 using the [place] (analysis version: 14.10) manufactured by [employer]. The following summarizes the results of our evaluation. PATIENT BIOGRAPHICAL: Name: Kim Santiago Patient ID: [id number] Birth Date: 1934-10-21 Height: 63.5 in. Gender: Female Exam Date: 12/05/2015 Weight: 137.4 lbs. Indications: Advanced Age, Breast ca, Caucasian, Early Menopause, Height Loss, POSTmenopausal Fractures: Treatments: 81 MG ASPIRIN, Calcium, levothyroxin, Vitamin D ASSESSMENT: The BMD measured at Femur Neck Left is 0.827 g/cm2  with a T-score of -1.5. This patient is considered osteopenic according to [facility]) criteria. Site Region Measured Measured WHO Young Adult BMD Date       Age      Classification T-score DualFemur Neck Left 12/05/2015 78.9 Oste[age] -1.5 0.827 g/cm2 AP Spine L1-L4 12/05/2015 78.9 Normal -0.3 1.139 g/cm2 World Health Organization [facility]) criteria for post-menopausal, Caucasian Women: Normal:       T-score at or above -1 SD Osteopenia:   T-score between -1 and -2.5 SD Osteoporosis: T-score at or below -2.5 SD RECOMMENDATIONS: [place] recommends that FDA-approved medical therapies be considered in postemenopausal women and men age 110 or older with a: 1. Hip or vertebral (clinical or morphometric) fracture. 2. T-score of < -2.5at the spine or hip. 3. Ten-year fracture probability by FRAX of 3% or greater for hip fracture or 20% or greater for major osteoporotic fracture. All treatment decisions require clinical judgment and consideration of individual  patient factors, including patient preferences, co-morbidities, previous drug use, risk factors not captured in the FRAX model (e.g. falls, vitamin D deficiency, increased bone turnover, interval significant decline in bone density) and possible under - or over-estimation of fracture risk by FRAX. All patients should ensure an adequate intake of dietary calcium (1200 mg/d) and vitamin D (800 IU daily) unless contraindicated. FOLLOW-UP: People with diagnosed cases of osteoporosis or at high risk for fracture should have regular bone mineral density tests. For patients eligible for Medicare, routine testing is allowed once every 2 years. The testing frequency can be increased to one year for patients who have rapidly progressing disease, those who are receiving or discontinuing medical therapy to restore bone mass, or have additional risk factors. I have reviewed this report, and agree with the above findings. [facility]  Radiology Dear Dr Nolon Stalls, Your patient PATRECIA ALICEA completed a FRAX assessment on 12/05/2015 using the [place] (analysis version: 14.10) manufactured by [employer]. The following summarizes the results of our evaluation. PATIENT BIOGRAPHICAL: Name: Zuley, Karel Patient ID: [id number] Birth Date: 12-10-1934 Height:    63.5 in. Gender:     [age]       Weight:    137.4 lbs. Ethnicity:  White                            Exam Date: 12/05/2015 FRAX* RESULTS:  (version: 3.5) 10-year Probability of Fracture1 Major Osteoporotic Fracture2 Hip Fracture 13.0% 3.1% Population: [place] (Caucasian) Risk Factors: None Based on Femur (Left) Neck BMD 1 -The 10-year probability of fracture may be lower than reported if the patient has received treatment. 2 -Major Osteoporotic Fracture: Clinical Spine, Forearm, Hip or Shoulder *FRAX is a Materials engineer of the [employer] for Metabolic Bone Disease, a [place] (WHO) [facility]. ASSESSMENT: The probability of a major osteoporotic fracture is 13.0% within the next ten years. The probability of a hip fracture is 3.1% within the next ten years. Electronically Signed   By: David  Martinique M.D.   On: 12/05/2015 11:17    Assessment & Plan:   Problem List Items Addressed This Visit    Grief    Patient is dealing with the unexpected loss of brother and has adequate coping skills and emotional support .  i have asked patient to return in one month to examine for signs of unresolving grief.       Breast cancer, female, left    Found in Feb 2017 during diagnostic mammogram of left breast. Treated with simple mastectomy.  ONe positive LN> .  Chemo offered but deferred in favor of adjuvant therapy.       Edema - Primary   Relevant Medications   furosemide (LASIX) 20 MG tablet    Other Visit Diagnoses    Other fatigue        Vitamin D deficiency         Hyperlipidemia        Relevant Medications    furosemide (LASIX) 20 MG tablet    Need for hepatitis C screening test           I am having Ms. Chong maintain her cholecalciferol, Calcium Carbonate-Vitamin D, Multiple Vitamins-Minerals (OSTEO COMPLEX PO), vitamin B-12, aspirin, polyethylene glycol, fish oil-omega-3 fatty acids, triamcinolone cream, metoprolol succinate, omeprazole, losartan, levothyroxine, traMADol, meloxicam, acetaminophen, and  furosemide.  Meds ordered this encounter  Medications  . furosemide (LASIX) 20 MG tablet    Sig: Once daily as needed for fluid retention    Dispense:  90 tablet    Refill:  1    Medications Discontinued During This Encounter  Medication Reason  . furosemide (LASIX) 20 MG tablet Reorder   A total of 25 minutes of face to face time was spent with patient more than half of which was spent in counselling about the above mentioned conditions  and coordination of care.   Follow-up: Return in about 3 months (around 03/26/2016) for wellness.   [name], MD

## 2015-12-26 NOTE — Progress Notes (Signed)
Pre-visit discussion using our clinic review tool. No additional management support is needed unless otherwise documented below in the visit note.  

## 2015-12-27 ENCOUNTER — Ambulatory Visit: Payer: PPO | Admitting: Internal Medicine

## 2015-12-27 LAB — HEPATITIS C ANTIBODY: HCV AB: NEGATIVE

## 2015-12-28 NOTE — Assessment & Plan Note (Signed)
Patient is dealing with the unexpected loss of brother and has adequate coping skills and emotional support .  i have asked patient to return in one month to examine for signs of unresolving grief.

## 2015-12-28 NOTE — Assessment & Plan Note (Addendum)
Found in Feb 2017 during diagnostic mammogram of left breast. Treated with simple mastectomy.  ONe positive LN> .  Chemo offered but deferred in favor of adjuvant therapy.

## 2015-12-29 ENCOUNTER — Encounter: Payer: Self-pay | Admitting: Hematology and Oncology

## 2015-12-29 ENCOUNTER — Encounter: Payer: Self-pay | Admitting: Internal Medicine

## 2015-12-29 NOTE — Progress Notes (Signed)
[facility] day:  12/11/2015  Chief Complaint: Kim Santiago is a 81 y.o. female with stage IIB left breast cancer who is seen for review of interval bone density study and further discussions regarding direction of therapy.  HPI:  The patient was last seen in the medical oncology clinic on 12/04/2015.  At that time, she was seen for initial consultation regarding breast cancer.  She had undergone mastectomy.  Pathology revealed a stage IIB (T2N1aM0), hormone receptor positive, Her2/neu negative tumor.  Concerning features were lymphovascular invasion and a macrometastasis in 1 sentinel lymph node (total 5 lymph nodes removed).   At last visit, we discussed individualization of therapy for patients older than 70.  Concern was raised about potential treatment options given a Santiago of an EF of 35% post myocardial infarction in 2013.  She was not felt to be a good candidate for an anthracycline.  We discussed second generation chemotherapy (TC) and an aromatase inhibitor.  Her greatest treatment benefit was felt to be with hormonal therapy (as compared to chemotherapy).  A benefit-risk calculator was discussed.  We also discussed Oncotype DX testing and MammaPrint.  We discussed a baseline bone density study.  Bone density study on 12/05/2015 revealed osteopenia with a T-score of -1.5 in the left femoral neck.    Symptomatically, she feels "ok".  She has some issues with constipation.  She has paperwork from 09/21/2015 revealing an EF of 55-60%.   Past Medical History  Diagnosis Date  . Menopause 40s    natural, hot flashes and mood lability now gone, off prempro 7 months  . hypothyroidism     secondary to thyroidectomy for thyroid ca  . Hypertension   . Myocardial infarction ([place]) 2013  . Stress-induced cardiomyopathy September of 2013    EF 35%. Peak troponin was 1.8.  . Osteoporosis     Osteopenia  . Sjoegren syndrome ([place])   . Rosacea   .  Hypothyroidism   . Chronic kidney disease     UTI  . Arthritis   . Cancer [facility])     thyroid takes levothyroxine    Past Surgical History  Procedure Laterality Date  . Kyphosis surgery  Feb 2008    L1, Dr. Mauri Pole  . Cholecystectomy    . Tonsillectomy    . Tubal ligation    . Lumbar disc surgery      L4-L5  . Shoulder arthroscopy  2004    Left, Dr. Jefm Bryant  . Thyroidectomy      Thyroid Cancer  . Spine surgery      L4-5 diskectomy  . Cardiac catheterization  05/2012    [facility]. No significant CAD. Ejection fraction of 35% due to stress-induced cardiomyopathy.  . Dilation and curettage of uterus    . Back surgery    . Simple mastectomy with axillary sentinel node biopsy Left 11/18/2015    Procedure: SIMPLE MASTECTOMY;  Surgeon: Robert Bellow, MD;  Location: [facility] ORS;  Service: General;  Laterality: Left;  . Sentinel node biopsy Left 11/18/2015    Procedure: SENTINEL NODE BIOPSY;  Surgeon: Robert Bellow, MD;  Location: [facility] ORS;  Service: General;  Laterality: Left;  . Breast biopsy Left 10/30/15    positive, done in Dr. Dwyane Luo office    Family History  Problem Relation Age of Onset  . COPD Father   . Cancer Father     esophageal  . Kidney disease Mother   . Heart disease Mother   .  Kidney disease Sister   . Cancer Brother 48    colon cancer (both brothers)    Social History:  reports that she has never smoked. She has never used smokeless tobacco. She reports that she does not drink alcohol or use illicit drugs.  She is from [place], [place].  She was married for 89 years.  Her husband died 49 months ago.  She lives alone in [place].  She has an adopted daughter and 2 grandchildren.  The patient is accompanied by her daughter, Kim Santiago, and her friend, Kim Santiago, today.  Allergies:  Allergies  Allergen Reactions  . Amoxicillin Rash  . Codeine Nausea And Vomiting  . Naprosyn [Naproxen] Swelling  . Orudis [Ketoprofen] Hives  . Sulfathiazole Rash    Current  Medications: Current Outpatient Prescriptions  Medication Sig Dispense Refill  . acetaminophen (TYLENOL) 650 MG CR tablet Take 650 mg by mouth every 8 (eight) hours as needed for pain.    Marland Kitchen aspirin 81 MG tablet Take 81 mg by mouth daily.    . Calcium Carbonate-Vitamin D (CALCIUM + D) 600-200 MG-UNIT TABS Take 1 tablet by mouth daily.      . cholecalciferol (VITAMIN D) 1000 UNITS tablet Take 1,000 Units by mouth daily.      . fish oil-omega-3 fatty acids 1000 MG capsule Take 2 g by mouth daily.    Marland Kitchen levothyroxine (SYNTHROID, LEVOTHROID) 88 MCG tablet Take 1 tablet (88 mcg total) by mouth daily. 90 tablet 4  . losartan (COZAAR) 25 MG tablet Take 1 tablet (25 mg total) by mouth daily. 90 tablet 4  . meloxicam (MOBIC) 7.5 MG tablet Take 1 tablet (7.5 mg total) by mouth daily. 30 tablet 1  . metoprolol succinate (TOPROL XL) 25 MG 24 hr tablet Take 1 tablet (25 mg total) by mouth daily. 90 tablet 4  . Multiple Vitamins-Minerals (OSTEO COMPLEX PO) Take 1 tablet by mouth daily. Take one by mouth daily    . omeprazole (PRILOSEC) 20 MG capsule Take 1 capsule (20 mg total) by mouth daily. 90 capsule 4  . polyethylene glycol (MIRALAX / GLYCOLAX) packet Take 17 g by mouth daily.    . traMADol (ULTRAM) 50 MG tablet Take 1 tablet (50 mg total) by mouth every 4 (four) hours as needed for moderate pain or severe pain. 30 tablet 0  . triamcinolone cream (KENALOG) 0.1 % Apply 1 application topically 2 (two) times daily. 30 g 0  . vitamin B-12 (CYANOCOBALAMIN) 1000 MCG tablet Take 1,000 mcg by mouth daily.    . furosemide (LASIX) 20 MG tablet Once daily as needed for fluid retention 90 tablet 1   No current facility-administered medications for this visit.    Review of Systems:  GENERAL:  Feels "ok".  Active.  No fevers, sweats or weight loss. PERFORMANCE STATUS (ECOG):  1 HEENT:  No visual changes, runny nose, sore throat, mouth sores or tenderness. Lungs: No shortness of breath or cough.  No  hemoptysis. Cardiac:  No chest pain, palpitations, orthopnea, or PND. GI:  No nausea, vomiting, diarrhea, constipation, melena or hematochezia. GU:  No urgency, frequency, dysuria, or hematuria. Musculoskeletal:  No back pain.  Knee issues.  No muscle tenderness. Extremities:  No pain or swelling. Skin:  Healing well from surgery.  No rashes or skin changes. Neuro:  No headache, numbness or weakness, balance or coordination issues. Endocrine:  No diabetes.  Thyroid disease on Synthroid.  No hot flashes or night sweats. Psych:  No mood changes, depression or anxiety.  Pain:  No focal pain. Review of systems:  All other systems reviewed and found to be negative.  Physical Exam: Blood pressure 159/97, pulse 75, temperature 96.4 F (35.8 C), temperature source Tympanic, resp. rate 18, height 5' 4" (1.626 m), weight 137 lb 10.8 oz (62.45 kg). GENERAL:  Well developed, well nourished, woman sitting comfortably in the exam room in no acute distress. MENTAL STATUS:  Alert and oriented to person, place and time. HEAD:  Short gray hair.  Normocephalic, atraumatic, face symmetric, no Cushingoid features. EYES:  Blue/brown eyes.  No conjunctivitis or scleral icterus. NEUROLOGICAL: Unremarkable. PSYCH:  Appropriate.  No visits with results within 3 Day(s) from this visit. Latest known visit with results is:  Admission on 11/18/2015, Discharged on 11/18/2015  Component Date Value Ref Range Status  . SURGICAL PATHOLOGY 11/18/2015    Final-Edited                   Value:Surgical Pathology THIS IS AN ADDENDUM Santiago CASE: [id number] PATIENT: Kim Santiago Addendum  Reason for Addendum #1:  Molecular studies  SPECIMEN SUBMITTED: A. Breast, left B. Sentinel lymph node #1  #2  CLINICAL HISTORY: None provided  PRE-OPERATIVE DIAGNOSIS: Left breast cancer  POST-OPERATIVE DIAGNOSIS: Same as pre-op     DIAGNOSIS: A. LEFT BREAST; TOTAL MASTECTOMY: - SOLITARY  INVASIVE MAMMARY CARCINOMA OF NO SPECIAL TYPE, 3.9 CM. - DUCTAL CARCINOMA IN SITU. - LYMPH VASCULAR AND DERMAL LYMPHATIC INVASION IDENTIFIED. - THE SURGICAL MARGINS ARE NEGATIVE. - MICROCALCIFICATIONS IN BENIGN AND MALIGNANT TISSUE. - THREE BENIGN AXILLARY LYMPH NODES (0/3). - SEE SUMMARY BELOW.  B. SENTINEL LYMPH NODE #1 AND 2; EXCISION: - ONE OF TWO LYMPH NODES POSITIVE FOR MACRO METASTASES (1/2).   INVASIVE CARCINOMA OF THE BREAST: Complete Excision Specimens InvolvedA: Breast, left B: Sentinel lymph nod                         e #1  #2  SPECIMEN Procedure:     Total mastectomy (including nipple and skin) Lymph Node Sampling:     Sentinel lymph node(s) Specimen Laterality:     Left TUMOR Presence of Invasive Carcinoma:    Histologic Type: Invasive mammary carcinoma of no special type (ductal, not otherwise specified) Histologic Grade:   Glandular (acinar) / tubular differentiation: Score 3 (< 10% of tumor area forming glandular / tubular structures) Nuclear pleomorphism: Score 2 (cells larger than normal with open vesicular nuclei, visible nucleoli, and moderate variability in both size and shape) Mitotic rate:  Score 1 (<=3 mitoses per mm2) Overall grade: Grade 2 (scores of 6 or 7) Ductal Carcinoma In Situ (DCIS):   DCIS is present Negative for extensive intraductal component (EIC) Tumor Size / Focality:   Tumor size: size of largest invasive carcinoma: Greatest dimension of largest focus of invasion > 1 mm: 28m Tumor focality:     Single focus of invasive carcinoma Tumor Extent Macr                         oscopic and Microscopic Extent of Tumor Skin:     Skin invasion: Invasive carcinoma directly invades into the dermis or epidermis without skin ulceration (this does not change the T stage) Skin satellite foci:     Negative for satellite foci Nipple DCIS:   DCIS does not involve the nipple epidermis Skeletal muscle:    Specimen does not contain skeletal  muscle Accessory Tumor Findings  Lymph-Vascular Invasion: Present Treatment Effect: Response to Presurgical (Neoadjuvant) Therapy: No known presurgical therapy MARGINS Invasive Carcinoma: Margins negative for invasive carcinoma Distance from closest margin: Distance is > 10 mm Closest Negative Margin(s):   Posterior Ductal Carcinoma In Situ (DCIS):   Margins negative for DCIS (DCIS present in specimen) Distance of DCIS from closest margin:   Distance is > 10 mm Closest Negative Margin(s):   Posterior LYMPH NODES Regional Lymph Nodes:    Number of Sentinel Nodes Examined: Specify number 2 Number of lymph node(s) exam                         ined:  Number 5 Lymph node involvement:  Number of lymph nodes with macrometastases (> 2 mm): Number 1 Number of lymph nodes with micrometastases: Number 0 Number of lymph nodes with isolated tumor cells (<= 0.2 mm and <= 200 cells):   Number 0 STAGE (pTNM) TNM Descriptors:    Not applicable n/a Primary Tumor (Invasive Carcinoma) (pT):     pT2:  tumor > 20 mm but <= 50 mm in greatest dimension Regional lymph nodes (pN) (choose a category based on lymph nodes received with the specimen;  immunohistochemistry and / or molecular studies are not required) Category (pN): pN1a: metastases in 1 to 3 axillary lymph nodes, at least 1 metastasis greater than 2 mm Distant Metastasis (pM): Not applicable - pM cannot be determined from the submitted specimen(s) Comment(s): Invasive carcinoma is identified in tissue between the two masses. The tumor is entirely submitted and is present in 13 slides.   GROSS DESCRIPTION: A. Labeled: Left breast  Time in fixative: 3:10                          PM on 11/18/2015  Cold ischemic time: 40 minutes  Total fixation time: 6 1/2 Hours  Type of mastectomy: Simple  Laterality: Left  Weight of specimen: 707 grams  Size of specimen: 23.5 x 20.5 x 3.9 cm  Orientation of specimen: The letters M and  L are written in surgical marker on the skin surface to designate the medial and lateral aspects specimen. Anterior half specimen inked blue, posterior specimen inked black.  Skin ellipse dimensions  description: 15.5 x 11.2 cm, unremarkable  Nipple/ areola: Nipple 1 cm in diameter, flattened. Areola 4.3 cm in diameter, unremarkable  Axillary tail: Absent  Biopsy site(s): 2 discrete biopsy sites identified  Number of discrete masses: 2  Location of mass(es): One mass is located approximately at the 6:00 position behind the nipple areolar complex, and the other is located in the approximate 1:00 position in the upper outer quadrant.  Distance between masses: Approximately 1.8 cm  Size of mass(es)/biopsy site(s):                          First mass approximately 1 x 0.9 x 0.5 cm, and second mass approximately 1 x 0.7 x 0.8 cm  Description of mass(es)/biopsy site(s): The first mass is surrounded by a large amount of fibrous tissue measures approximately 2.5 x 2.5 x 1.5 cm. Both the first and second masses are extremely poorly defined, stellate, pale tan to white.  Margins: The first mass is grossly located 3.5 cm from the deep margin and the second mass is grossly located 3 cm from the deep margin  Gross involvement of skin/fascia/muscle by tumor: Absent  Description of remaining  breast: The rest the breast tissue is predominantly fatty with interspersed areas of dense white fibrous tissue  Lymph nodes: On dissection 3 lymph node candidates identified from 0.5 cm to 1.2 x 0.8 x 0.5 cm.  Block Summary: 1-skin 2-nipple 3-deep margin of resection 4-9 entire first mass including biopsy cavity and adjacent fibrous tissue at the left breast mass 10-12-entire second mass 13-16-breast tissue between                          first and second mass 17-representative section upper inner quadrant 18-representative section lower inner quadrant 19-representative section upper  outer quadrant 20-representative section lower outer quadrant 21- 2 lymph node candidates submitted on 11/20/15 22-1 sectioned lymph node candidate submitted on 11/20/15  B. Labeled: Sentinel node #1 and 2  Tissue fragment(s): 2  Size: 0.4 x 0.3 x 0.2 cm and 1.2 x 0.6 x 0.6 cm  Description: 2 pink red lymph node candidates covered by adipose tissue, smaller inked blue  Entirely submitted in one cassette(s).         Final Diagnosis performed by Delorse Lek, MD.  Electronically signed 11/21/2015 2:59:43PM   The electronic signature indicates that the named Attending Pathologist has evaluated the specimen  Technical component performed at [facility], [address] [place], [place] [address] Lab: [phone] Dir: Darrick Penna. Evette Doffing, MD  Professional component performed at [facility], [facility],                          [place], [place], [place] [address] Lab: [phone] Dir: Dellia Nims. Reuel Derby, MD   ADDENDUM: At Dr. Kem Parkinson request, FFPE tumor tissue (block A8) was submitted to [place], [place], for MammaPrint testing, a 70-gene expression profile. The assay result is reported as Santiago Risk, +0.192. Please see separate Santiago, requisition number [id number], in [place].    Addendum #1 performed by Bryan Lemma, MD.  Electronically signed 12/24/2015 12:25:01PM    Technical component performed at [facility], [address] [place], [place] [address] Lab: [phone] Dir: Darrick Penna. Evette Doffing, MD  Professional component performed at [facility], [facility], [place], [place], [place] [address] Lab: [phone] Dir: Dellia Nims. Rubinas, MD      Assessment:  SHANZAY HEPWORTH is a 81 y.o. female with stage IIB left breast cancer s/p mastectomy on 11/07/2015.  Pathology revealed a 3.9 cm grade II invasive mammary carcinoma.  There was ductal carcinoma in situ (DCIS).  There was lymphvascular and dermal lymphatic invasion.  Margins  were negative.  One of 2 sentinel lymph nodes were positive for macrometastasis.  In addition, there were 3 benign axillary lymph nodes (total 5 lymph nodes).  Tumor was estrogen receptor positive (100%), progesterone receptor positive (70%), and Her2/neu negative.  Pathologic stage was T2N1a.  CA27.29 was 30.4 on 11/15/2015.  Bone density study on 12/05/2015 revealed osteopenia with a T-score of -1.5 in the left femoral neck.    She has a history of a myocardial infarction in 06/2012.  Echocardiogram revealed an EF of 35%.  Follow-up echo on 09/21/2015 revealed an EF of 55-60%.  Patient is unaware of a history of heart failure.   She has a history of thyroid carcinoma 40 years ago treated with I-131.   Symptomatically, she feels well.  Exam reveals a well healing mastectomy incision.  Plan: 1.  Review bone density study.  Discuss osteopenia.  Discuss being active.  Discuss  calcium 1200 mg and vitamin D 800 IU a day.  2.  Discuss risked based calculator estimates.  With no therapy except surgery in a 81 year old woman with her tumor features, 38% of patents will be alive at 10 years, 41% will have died of non-cancer causes, and 21% will have died secondary to breast cancer.  With an aromatase inhibitor alone, 43% will be alive at 10 years, 43% will have died of non-cancer causes, and 14% will have died secondary to breast cancer.  With an aromatase inhibitor plus second generation chemotherapy, 46% will be alive at 10 years, 44% will have died of non-cancer causes, and 10% will have died secondary to breast cancer.  A comparative graph was provided for a younger patient.  Print-outs were provided.    We discussed statistical data and how it relates to the individual.  Given the patient's age and potential benefit (43% alive versus 46% alive at 27 years) with hormonal and chemotherapy, I discussed consideration of MammaPrint.  I reviewed the NEJM paper (2016) regarding the 70-gene signature test  (MammaPrint) to aide in treatment decisions.  Approximately 45 women were entered on the study.  Patients could have tumors up to 5 cm with up to 3 lymph nodes positive.  Most patients were lymph node negative.  Patients who were felt to have a high clinical risk for recurrence and on MammaPrint had a Santiago genomic risk of recurrence, 5 year survival without distant recurrence was approximately 95% in those not receiving chemotherapy.  After an extended discussion with multiple questions asked and answered, decision was made to proceed with MammaPrint testing.  3.  RTC in 2 weeks to review MammaPrint results.   Lequita Asal, MD  12/11/2015

## 2016-01-01 ENCOUNTER — Telehealth: Payer: Self-pay | Admitting: Hematology and Oncology

## 2016-01-01 ENCOUNTER — Inpatient Hospital Stay (HOSPITAL_BASED_OUTPATIENT_CLINIC_OR_DEPARTMENT_OTHER): Payer: PPO | Admitting: Hematology and Oncology

## 2016-01-01 VITALS — BP 111/68 | HR 73 | Temp 98.0°F | Wt 135.0 lb

## 2016-01-01 DIAGNOSIS — N189 Chronic kidney disease, unspecified: Secondary | ICD-10-CM

## 2016-01-01 DIAGNOSIS — K5900 Constipation, unspecified: Secondary | ICD-10-CM

## 2016-01-01 DIAGNOSIS — E039 Hypothyroidism, unspecified: Secondary | ICD-10-CM

## 2016-01-01 DIAGNOSIS — Z9012 Acquired absence of left breast and nipple: Secondary | ICD-10-CM

## 2016-01-01 DIAGNOSIS — E890 Postprocedural hypothyroidism: Secondary | ICD-10-CM

## 2016-01-01 DIAGNOSIS — C773 Secondary and unspecified malignant neoplasm of axilla and upper limb lymph nodes: Secondary | ICD-10-CM

## 2016-01-01 DIAGNOSIS — I252 Old myocardial infarction: Secondary | ICD-10-CM

## 2016-01-01 DIAGNOSIS — Z780 Asymptomatic menopausal state: Secondary | ICD-10-CM

## 2016-01-01 DIAGNOSIS — I129 Hypertensive chronic kidney disease with stage 1 through stage 4 chronic kidney disease, or unspecified chronic kidney disease: Secondary | ICD-10-CM

## 2016-01-01 DIAGNOSIS — Z170 Estrogen receptor positive status [ER+]: Secondary | ICD-10-CM

## 2016-01-01 DIAGNOSIS — D0512 Intraductal carcinoma in situ of left breast: Secondary | ICD-10-CM | POA: Diagnosis not present

## 2016-01-01 DIAGNOSIS — C50912 Malignant neoplasm of unspecified site of left female breast: Secondary | ICD-10-CM

## 2016-01-01 DIAGNOSIS — Z85850 Personal history of malignant neoplasm of thyroid: Secondary | ICD-10-CM

## 2016-01-01 DIAGNOSIS — M8580 Other specified disorders of bone density and structure, unspecified site: Secondary | ICD-10-CM

## 2016-01-01 NOTE — Progress Notes (Signed)
[facility] day:  01/01/2016  Chief Complaint: Kim Santiago is a 81 y.o. female with stage IIB left breast cancer who is seen for review of MammaPrint results and further discussions regarding direction of therapy.  HPI:  The patient was last seen in the medical oncology clinic on 12/11/2015.  At that time, bone density study was reviewed.  She was noted to have osteopenia.  We discussed calcium and vitamin D.  We discussed risk based calculator estimates about recurrence.  Decision was made to pursue MammaPrint testing.  MammaPrint on 12/18/2015 revealed a low risk of recurrence with a 5% risk of distant recurrence of 5 years and a 10% recurrence at 10 years.  Regarding adjuvant response to therapy, distant free survival was estimated at 99% with endocrine and chemotherapy compared to 93% with endocrine therapy alone.  Symptomatically, she feels sore under her arm.  Her brother recently died.   Past Medical History  Diagnosis Date  . Menopause 40s    natural, hot flashes and mood lability now gone, off prempro 7 months  . hypothyroidism     secondary to thyroidectomy for thyroid ca  . Hypertension   . Myocardial infarction ([place]) 2013  . Stress-induced cardiomyopathy September of 2013    EF 35%. Peak troponin was 1.8.  . Osteoporosis     Osteopenia  . Sjoegren syndrome ([place])   . Rosacea   . Hypothyroidism   . Chronic kidney disease     UTI  . Arthritis   . Cancer [facility])     thyroid takes levothyroxine    Past Surgical History  Procedure Laterality Date  . Kyphosis surgery  Feb 2008    L1, Dr. Mauri Pole  . Cholecystectomy    . Tonsillectomy    . Tubal ligation    . Lumbar disc surgery      L4-L5  . Shoulder arthroscopy  2004    Left, Dr. Jefm Bryant  . Thyroidectomy      Thyroid Cancer  . Spine surgery      L4-5 diskectomy  . Cardiac catheterization  05/2012    [facility]. No significant CAD. Ejection fraction of 35% due to  stress-induced cardiomyopathy.  . Dilation and curettage of uterus    . Back surgery    . Simple mastectomy with axillary sentinel node biopsy Left 11/18/2015    Procedure: SIMPLE MASTECTOMY;  Surgeon: Robert Bellow, MD;  Location: [facility] ORS;  Service: General;  Laterality: Left;  . Sentinel node biopsy Left 11/18/2015    Procedure: SENTINEL NODE BIOPSY;  Surgeon: Robert Bellow, MD;  Location: [facility] ORS;  Service: General;  Laterality: Left;  . Breast biopsy Left 10/30/15    positive, done in Dr. Dwyane Luo office    Family History  Problem Relation Age of Onset  . COPD Father   . Cancer Father     esophageal  . Kidney disease Mother   . Heart disease Mother   . Kidney disease Sister   . Cancer Brother 55    colon cancer (both brothers)    Social History:  reports that she has never smoked. She has never used smokeless tobacco. She reports that she does not drink alcohol or use illicit drugs.  She is from [place], [place].  She was married for 60 years.  Her husband died 75 months ago.  She lives alone in [place].  She has an adopted daughter and 2 grandchildren.  Her brother died of a  MI following a cholecystectomy in 12/2015.  The patient is accompanied by her daughter, [name].  Allergies:  Allergies  Allergen Reactions  . Amoxicillin Rash  . Codeine Nausea And Vomiting  . Naprosyn [Naproxen] Swelling  . Orudis [Ketoprofen] Hives  . Sulfathiazole Rash    Current Medications: Current Outpatient Prescriptions  Medication Sig Dispense Refill  . acetaminophen (TYLENOL) 650 MG CR tablet Take 650 mg by mouth every 8 (eight) hours as needed for pain.    Marland Kitchen aspirin 81 MG tablet Take 81 mg by mouth daily.    . Calcium Carbonate-Vitamin D (CALCIUM + D) 600-200 MG-UNIT TABS Take 1 tablet by mouth daily.      . cholecalciferol (VITAMIN D) 1000 UNITS tablet Take 1,000 Units by mouth daily.      . fish oil-omega-3 fatty acids 1000 MG capsule Take 2 g by mouth daily.    . furosemide  (LASIX) 20 MG tablet Once daily as needed for fluid retention 90 tablet 1  . levothyroxine (SYNTHROID, LEVOTHROID) 88 MCG tablet Take 1 tablet (88 mcg total) by mouth daily. 90 tablet 4  . losartan (COZAAR) 25 MG tablet Take 1 tablet (25 mg total) by mouth daily. 90 tablet 4  . meloxicam (MOBIC) 7.5 MG tablet Take 1 tablet (7.5 mg total) by mouth daily. 30 tablet 1  . metoprolol succinate (TOPROL XL) 25 MG 24 hr tablet Take 1 tablet (25 mg total) by mouth daily. 90 tablet 4  . Multiple Vitamins-Minerals (OSTEO COMPLEX PO) Take 1 tablet by mouth daily. Take one by mouth daily    . omeprazole (PRILOSEC) 20 MG capsule Take 1 capsule (20 mg total) by mouth daily. 90 capsule 4  . polyethylene glycol (MIRALAX / GLYCOLAX) packet Take 17 g by mouth daily.    . traMADol (ULTRAM) 50 MG tablet Take 1 tablet (50 mg total) by mouth every 4 (four) hours as needed for moderate pain or severe pain. 30 tablet 0  . triamcinolone cream (KENALOG) 0.1 % Apply 1 application topically 2 (two) times daily. 30 g 0  . vitamin B-12 (CYANOCOBALAMIN) 1000 MCG tablet Take 1,000 mcg by mouth daily.     No current facility-administered medications for this visit.    Review of Systems:  GENERAL:  Feels "ok".  No fevers or sweats.  Weight down 2 pounds. PERFORMANCE STATUS (ECOG):  1 HEENT:  No visual changes, runny nose, sore throat, mouth sores or tenderness. Lungs: No shortness of breath or cough.  No hemoptysis. Cardiac:  No chest pain, palpitations, orthopnea, or PND. GI:  No nausea, vomiting, diarrhea, constipation, melena or hematochezia. GU:  No urgency, frequency, dysuria, or hematuria. Musculoskeletal:  No back pain.  Knee issues.  No muscle tenderness. Extremities:  No pain or swelling. Skin:  Healing well from surgery.  No rashes or skin changes. Neuro:  No headache, numbness or weakness, balance or coordination issues. Endocrine:  No diabetes.  Thyroid disease on Synthroid.  No hot flashes or night  sweats. Psych:  Recent loss of her brother.  Pain:  No focal pain. Review of systems:  All other systems reviewed and found to be negative.  Physical Exam: Blood pressure 111/68, pulse 73, temperature 98 F (36.7 C), temperature source Tympanic, weight 135 lb (61.236 kg). GENERAL:  Well developed, well nourished, woman sitting comfortably in the exam room in no acute distress. MENTAL STATUS:  Alert and oriented to person, place and time. HEAD:  Short gray hair.  Normocephalic, atraumatic, face symmetric, no Cushingoid  features. EYES:  Glasses.  Blue eyes.  Pupils equal round and reactive to light and accomodation. No conjunctivitis or scleral icterus. ENT: Oropharynx clear without lesion. Tongue normal. Mucous membranes moist.  RESPIRATORY: Clear to auscultation without rales, wheezes or rhonchi. CARDIOVASCULAR: Regular rate and rhythm without murmur, rub or gallop. No JVD. ABDOMEN: Soft, non-tender, with active bowel sounds, and no hepatosplenomegaly. No masses. SKIN: No rashes, ulcers or lesions. EXTREMITIES: No edema, no skin discoloration or tenderness. No palpable cords. LYMPH NODES:  No palpable cervical, supraclavicular, axillary or inguinal adenopathy  NEUROLOGICAL: Unremarkable. PSYCH: Appropriate.   No visits with results within 3 Day(s) from this visit. Latest known visit with results is:  Admission on 11/18/2015, Discharged on 11/18/2015  Component Date Value Ref Range Status  . SURGICAL PATHOLOGY 11/18/2015    Final-Edited                   Value:Surgical Pathology THIS IS AN ADDENDUM REPORT CASE: [id number] PATIENT: [facility] Surgical Pathology Report Addendum  Reason for Addendum #1:  Molecular studies  SPECIMEN SUBMITTED: A. Breast, left B. Sentinel lymph node #1  #2  CLINICAL HISTORY: None provided  PRE-OPERATIVE DIAGNOSIS: Left breast cancer  POST-OPERATIVE DIAGNOSIS: Same as pre-op     DIAGNOSIS: A. LEFT BREAST; TOTAL  MASTECTOMY: - SOLITARY INVASIVE MAMMARY CARCINOMA OF NO SPECIAL TYPE, 3.9 CM. - DUCTAL CARCINOMA IN SITU. - LYMPH VASCULAR AND DERMAL LYMPHATIC INVASION IDENTIFIED. - THE SURGICAL MARGINS ARE NEGATIVE. - MICROCALCIFICATIONS IN BENIGN AND MALIGNANT TISSUE. - THREE BENIGN AXILLARY LYMPH NODES (0/3). - SEE SUMMARY BELOW.  B. SENTINEL LYMPH NODE #1 AND 2; EXCISION: - ONE OF TWO LYMPH NODES POSITIVE FOR MACRO METASTASES (1/2).   INVASIVE CARCINOMA OF THE BREAST: Complete Excision Specimens InvolvedA: Breast, left B: Sentinel lymph nod                         e #1  #2  SPECIMEN Procedure:     Total mastectomy (including nipple and skin) Lymph Node Sampling:     Sentinel lymph node(s) Specimen Laterality:     Left TUMOR Presence of Invasive Carcinoma:    Histologic Type: Invasive mammary carcinoma of no special type (ductal, not otherwise specified) Histologic Grade:   Glandular (acinar) / tubular differentiation: Score 3 (< 10% of tumor area forming glandular / tubular structures) Nuclear pleomorphism: Score 2 (cells larger than normal with open vesicular nuclei, visible nucleoli, and moderate variability in both size and shape) Mitotic rate:  Score 1 (<=3 mitoses per mm2) Overall grade: Grade 2 (scores of 6 or 7) Ductal Carcinoma In Situ (DCIS):   DCIS is present Negative for extensive intraductal component (EIC) Tumor Size / Focality:   Tumor size: size of largest invasive carcinoma: Greatest dimension of largest focus of invasion > 1 mm: 49m Tumor focality:     Single focus of invasive carcinoma Tumor Extent Macr                         oscopic and Microscopic Extent of Tumor Skin:     Skin invasion: Invasive carcinoma directly invades into the dermis or epidermis without skin ulceration (this does not change the T stage) Skin satellite foci:     Negative for satellite foci Nipple DCIS:   DCIS does not involve the nipple epidermis Skeletal muscle:    Specimen does not  contain skeletal muscle Accessory Tumor Findings Lymph-Vascular Invasion: Present Treatment Effect:  Response to Presurgical (Neoadjuvant) Therapy: No known presurgical therapy MARGINS Invasive Carcinoma: Margins negative for invasive carcinoma Distance from closest margin: Distance is > 10 mm Closest Negative Margin(s):   Posterior Ductal Carcinoma In Situ (DCIS):   Margins negative for DCIS (DCIS present in specimen) Distance of DCIS from closest margin:   Distance is > 10 mm Closest Negative Margin(s):   Posterior LYMPH NODES Regional Lymph Nodes:    Number of Sentinel Nodes Examined: Specify number 2 Number of lymph node(s) exam                         ined:  Number 5 Lymph node involvement:  Number of lymph nodes with macrometastases (> 2 mm): Number 1 Number of lymph nodes with micrometastases: Number 0 Number of lymph nodes with isolated tumor cells (<= 0.2 mm and <= 200 cells):   Number 0 STAGE (pTNM) TNM Descriptors:    Not applicable n/a Primary Tumor (Invasive Carcinoma) (pT):     pT2:  tumor > 20 mm but <= 50 mm in greatest dimension Regional lymph nodes (pN) (choose a category based on lymph nodes received with the specimen;  immunohistochemistry and / or molecular studies are not required) Category (pN): pN1a: metastases in 1 to 3 axillary lymph nodes, at least 1 metastasis greater than 2 mm Distant Metastasis (pM): Not applicable - pM cannot be determined from the submitted specimen(s) Comment(s): Invasive carcinoma is identified in tissue between the two masses. The tumor is entirely submitted and is present in 13 slides.   GROSS DESCRIPTION: A. Labeled: Left breast  Time in fixative: 3:10                          PM on 11/18/2015  Cold ischemic time: 40 minutes  Total fixation time: 6 1/2 Hours  Type of mastectomy: Simple  Laterality: Left  Weight of specimen: 707 grams  Size of specimen: 23.5 x 20.5 x 3.9 cm  Orientation of specimen:  The letters M and L are written in surgical marker on the skin surface to designate the medial and lateral aspects specimen. Anterior half specimen inked blue, posterior specimen inked black.  Skin ellipse dimensions  description: 15.5 x 11.2 cm, unremarkable  Nipple/ areola: Nipple 1 cm in diameter, flattened. Areola 4.3 cm in diameter, unremarkable  Axillary tail: Absent  Biopsy site(s): 2 discrete biopsy sites identified  Number of discrete masses: 2  Location of mass(es): One mass is located approximately at the 6:00 position behind the nipple areolar complex, and the other is located in the approximate 1:00 position in the upper outer quadrant.  Distance between masses: Approximately 1.8 cm  Size of mass(es)/biopsy site(s):                          First mass approximately 1 x 0.9 x 0.5 cm, and second mass approximately 1 x 0.7 x 0.8 cm  Description of mass(es)/biopsy site(s): The first mass is surrounded by a large amount of fibrous tissue measures approximately 2.5 x 2.5 x 1.5 cm. Both the first and second masses are extremely poorly defined, stellate, pale tan to white.  Margins: The first mass is grossly located 3.5 cm from the deep margin and the second mass is grossly located 3 cm from the deep margin  Gross involvement of skin/fascia/muscle by tumor: Absent  Description of remaining breast: The rest the breast  tissue is predominantly fatty with interspersed areas of dense white fibrous tissue  Lymph nodes: On dissection 3 lymph node candidates identified from 0.5 cm to 1.2 x 0.8 x 0.5 cm.  Block Summary: 1-skin 2-nipple 3-deep margin of resection 4-9 entire first mass including biopsy cavity and adjacent fibrous tissue at the left breast mass 10-12-entire second mass 13-16-breast tissue between                          first and second mass 17-representative section upper inner quadrant 18-representative section lower inner quadrant 19-representative  section upper outer quadrant 20-representative section lower outer quadrant 21- 2 lymph node candidates submitted on 11/20/15 22-1 sectioned lymph node candidate submitted on 11/20/15  B. Labeled: Sentinel node #1 and 2  Tissue fragment(s): 2  Size: 0.4 x 0.3 x 0.2 cm and 1.2 x 0.6 x 0.6 cm  Description: 2 pink red lymph node candidates covered by adipose tissue, smaller inked blue  Entirely submitted in one cassette(s).         Final Diagnosis performed by Delorse Lek, MD.  Electronically signed 11/21/2015 2:59:43PM   The electronic signature indicates that the named Attending Pathologist has evaluated the specimen  Technical component performed at [facility], [address], [place], [place] [address] Lab: [phone] Dir: Darrick Penna. Evette Doffing, MD  Professional component performed at [facility], [facility],                          [place], [place], [place] [address] Lab: [phone] Dir: Dellia Nims. Reuel Derby, MD   ADDENDUM: At Dr. Kem Parkinson request, FFPE tumor tissue (block A8) was submitted to [place], [place], for MammaPrint testing, a 70-gene expression profile. The assay result is reported as Low Risk, +0.192. Please see separate report, requisition number [id number], in [place].    Addendum #1 performed by Bryan Lemma, MD.  Electronically signed 12/24/2015 12:25:01PM    Technical component performed at [facility], [address] [place], [place] [address] Lab: [phone] Dir: Darrick Penna. Evette Doffing, MD  Professional component performed at [facility], [facility], [place], [place], [place] [address] Lab: [phone] Dir: Dellia Nims. Rubinas, MD      Assessment:  DYNISHA DUE is a 81 y.o. female with stage IIB left breast cancer s/p mastectomy on 11/07/2015.  Pathology revealed a 3.9 cm grade II invasive mammary carcinoma.  There was ductal carcinoma in situ (DCIS).  There was lymphvascular and dermal lymphatic  invasion.  Margins were negative.  One of 2 sentinel lymph nodes were positive for macrometastasis.  In addition, there were 3 benign axillary lymph nodes (total 5 lymph nodes).  Tumor was estrogen receptor positive (100%), progesterone receptor positive (70%), and Her2/neu negative.  Pathologic stage was T2N1a.  CA27.29 was 30.4 on 11/15/2015.  MammaPrint on 12/18/2015 revealed a low risk of recurrence with a 5% risk of distant recurrence of 5 years and a 10% recurrence at 10 years.  Bone density study on 12/05/2015 revealed osteopenia with a T-score of -1.5 in the left femoral neck.    She has a history of a myocardial infarction in 06/2012.  Echocardiogram revealed an EF of 35%.  Follow-up echo on 09/21/2015 revealed an EF of 55-60%.  Patient is unaware of a history of heart failure.   She has a history of thyroid carcinoma 40 years ago treated with I-131.   Symptomatically, she feels well.  Exam is  unremarkable.  Plan: 1.  Review MammaPrint and low risk of recurrence with hormone therapy alone.  2.  Discuss referral to radiation oncology for evaluation by Dr.Chrystal for possible radiation.  Discussed my prior conversation with him. 3.  Anticipate initiation of hormonal therapy (aromatase inhibitor) after radiation if radiation therapy required.  Side effects reviewed. 4.  Patient to call regarding her decision about radiation therapy and future appointment. 5.  RTC after above for MD assessment, labs (CBC with diff, CMP, CA27.29) and initiation of hormonal therapy.   Lequita Asal, MD  01/01/2016 , 11:59 AM

## 2016-01-01 NOTE — Telephone Encounter (Signed)
Patient needs appt w/Dr. Baruch Gouty in the next 2 weeks. She would prefer Tuesday (5/2) after lunch if that is an option. Please advise Mickel Baas or pt of new appt time. Thank you!

## 2016-01-01 NOTE — Progress Notes (Signed)
Patient here for treatment planning.

## 2016-01-02 NOTE — Telephone Encounter (Signed)
Appointment has been made, patient is aware.

## 2016-01-07 ENCOUNTER — Encounter: Payer: Self-pay | Admitting: General Surgery

## 2016-01-07 ENCOUNTER — Ambulatory Visit (INDEPENDENT_AMBULATORY_CARE_PROVIDER_SITE_OTHER): Payer: PPO | Admitting: General Surgery

## 2016-01-07 VITALS — BP 124/62 | HR 84 | Resp 14 | Ht 64.0 in | Wt 138.0 lb

## 2016-01-07 DIAGNOSIS — C50912 Malignant neoplasm of unspecified site of left female breast: Secondary | ICD-10-CM

## 2016-01-07 NOTE — Patient Instructions (Signed)
The patient is aware to call back for any questions or concerns.  

## 2016-01-07 NOTE — Progress Notes (Signed)
Patient ID: Kim Santiago, female   DOB: Jun 06, 1935, 81 y.o.   MRN: [id number]  Chief Complaint  Patient presents with  . Routine Post Op    HPI Kim Santiago is a 81 y.o. female.  Here today for her postoperative visit, left mastectomy on 12-06-15. She states she is doing well. She did decline chemotherapy. Breast prothesis working well.  Her brother had a heart attack and passed 1 day post gall bladder surgery April 2017 in [place].  I personally reviewed the patient's history.  The patient reports that her meeting with Nolon Stalls, M.D. for medical oncology went well. She was asked to meet with radiation oncology which she is considering.  HPI  Past Medical History  Diagnosis Date  . Menopause 40s    natural, hot flashes and mood lability now gone, off prempro 7 months  . hypothyroidism     secondary to thyroidectomy for thyroid ca  . Hypertension   . Myocardial infarction ([place]) 2013  . Stress-induced cardiomyopathy September of 2013    EF 35%. Peak troponin was 1.8.  . Osteoporosis     Osteopenia  . Sjoegren syndrome ([place])   . Rosacea   . Hypothyroidism   . Chronic kidney disease     UTI  . Arthritis   . Cancer [facility])     thyroid takes levothyroxine  . Genetic screening March 2017.    Mammoprint of left breast cancer: Low risk for recurrence.     Past Surgical History  Procedure Laterality Date  . Kyphosis surgery  Feb 2008    L1, Dr. Mauri Pole  . Cholecystectomy    . Tonsillectomy    . Tubal ligation    . Lumbar disc surgery      L4-L5  . Shoulder arthroscopy  2004    Left, Dr. Jefm Bryant  . Thyroidectomy      Thyroid Cancer  . Spine surgery      L4-5 diskectomy  . Cardiac catheterization  05/2012    [facility]. No significant CAD. Ejection fraction of 35% due to stress-induced cardiomyopathy.  . Dilation and curettage of uterus    . Back surgery    . Simple mastectomy with axillary sentinel node biopsy Left 11/18/2015    Procedure: SIMPLE MASTECTOMY;   Surgeon: Robert Bellow, MD;  Location: [facility] ORS;  Service: General;  Laterality: Left;  . Sentinel node biopsy Left 11/18/2015    Procedure: SENTINEL NODE BIOPSY;  Surgeon: Robert Bellow, MD;  Location: [facility] ORS;  Service: General;  Laterality: Left;  . Breast biopsy Left 10/30/15    positive, done in Dr. Dwyane Luo office    Family History  Problem Relation Age of Onset  . COPD Father   . Cancer Father     esophageal  . Kidney disease Mother   . Heart disease Mother   . Kidney disease Sister   . Cancer Brother 64    colon cancer (both brothers)  . Heart attack Brother 67    Social History Social History  Substance Use Topics  . Smoking status: Never Smoker   . Smokeless tobacco: Never Used  . Alcohol Use: No    Allergies  Allergen Reactions  . Amoxicillin Rash  . Codeine Nausea And Vomiting  . Naprosyn [Naproxen] Swelling  . Orudis [Ketoprofen] Hives  . Sulfathiazole Rash    Current Outpatient Prescriptions  Medication Sig Dispense Refill  . acetaminophen (TYLENOL) 650 MG CR tablet Take 650 mg by mouth every 8 (eight) hours as  needed for pain.    Marland Kitchen aspirin 81 MG tablet Take 81 mg by mouth daily.    . Calcium Carbonate-Vitamin D (CALCIUM + D) 600-200 MG-UNIT TABS Take 1 tablet by mouth daily.      . cholecalciferol (VITAMIN D) 1000 UNITS tablet Take 1,000 Units by mouth daily.      . fish oil-omega-3 fatty acids 1000 MG capsule Take 2 g by mouth daily.    . furosemide (LASIX) 20 MG tablet Once daily as needed for fluid retention 90 tablet 1  . levothyroxine (SYNTHROID, LEVOTHROID) 88 MCG tablet Take 1 tablet (88 mcg total) by mouth daily. 90 tablet 4  . losartan (COZAAR) 25 MG tablet Take 1 tablet (25 mg total) by mouth daily. 90 tablet 4  . meloxicam (MOBIC) 7.5 MG tablet Take 1 tablet (7.5 mg total) by mouth daily. 30 tablet 1  . metoprolol succinate (TOPROL XL) 25 MG 24 hr tablet Take 1 tablet (25 mg total) by mouth daily. 90 tablet 4  . Multiple  Vitamins-Minerals (OSTEO COMPLEX PO) Take 1 tablet by mouth daily. Take one by mouth daily    . omeprazole (PRILOSEC) 20 MG capsule Take 1 capsule (20 mg total) by mouth daily. 90 capsule 4  . polyethylene glycol (MIRALAX / GLYCOLAX) packet Take 17 g by mouth daily.    . traMADol (ULTRAM) 50 MG tablet Take 1 tablet (50 mg total) by mouth every 4 (four) hours as needed for moderate pain or severe pain. 30 tablet 0  . triamcinolone cream (KENALOG) 0.1 % Apply 1 application topically 2 (two) times daily. 30 g 0  . vitamin B-12 (CYANOCOBALAMIN) 1000 MCG tablet Take 1,000 mcg by mouth daily.     No current facility-administered medications for this visit.    Review of Systems Review of Systems  Constitutional: Negative.   Respiratory: Negative.   Cardiovascular: Negative.     Blood pressure 124/62, pulse 84, resp. rate 14, height 5\' 4"  (1.626 m), weight 138 lb (62.596 kg).  Physical Exam Physical Exam  Constitutional: She is oriented to person, place, and time. She appears well-developed and well-nourished.  HENT:  Mouth/Throat: Oropharynx is clear and moist.  Eyes: Conjunctivae are normal. No scleral icterus.  Neck: Neck supple.  Cardiovascular: Normal rate, regular rhythm and normal heart sounds.   Pulmonary/Chest: Effort normal and breath sounds normal. Right breast exhibits no inverted nipple, no mass, no nipple discharge, no skin change and no tenderness.  Redundant skin medially, left mastectomy site well healed.  Lymphadenopathy:    She has no cervical adenopathy.    She has no axillary adenopathy.  Neurological: She is alert and oriented to person, place, and time.  Skin: Skin is warm and dry.  Psychiatric: Her behavior is normal.    Data Reviewed Medical oncology notes.  Assessment     Doing well status post left mastectomy.    Plan    The patient will notify radiation oncology if she is going to decline evaluation.     Follow up in 2 months.  PCP:  [name] This information has been scribed by Karie Fetch RN, BSN,BC.   Robert Bellow 01/08/2016, 4:52 PM

## 2016-01-08 ENCOUNTER — Encounter: Payer: Self-pay | Admitting: General Surgery

## 2016-01-08 ENCOUNTER — Telehealth: Payer: Self-pay | Admitting: Hematology and Oncology

## 2016-01-08 DIAGNOSIS — C50912 Malignant neoplasm of unspecified site of left female breast: Secondary | ICD-10-CM

## 2016-01-08 DIAGNOSIS — C50112 Malignant neoplasm of central portion of left female breast: Secondary | ICD-10-CM | POA: Diagnosis not present

## 2016-01-08 DIAGNOSIS — Z4432 Encounter for fitting and adjustment of external left breast prosthesis: Secondary | ICD-10-CM | POA: Diagnosis not present

## 2016-01-08 MED ORDER — LETROZOLE 2.5 MG PO TABS: 2.5000 mg | ORAL_TABLET | Freq: Every day | ORAL | Status: DC

## 2016-01-08 NOTE — Telephone Encounter (Signed)
I have made telephone note and all is done

## 2016-01-08 NOTE — Telephone Encounter (Signed)
Called pt and let her know that she can start on femara right away since she is choosing not to have radiation. We spoke about whether to get it through mail in pharmacy or get it local. I told her that I would rec: local because we don't knwo how she will tolerate it.  She is agreeable to this. I sent it to [facility] and she will get it and if she is tolerating to call about day 15 and then we can send refill in 90 day supply to her mail in pharmacy and if not tolerating to call our office and she is agreeable to this. i will send in basket message to sch. To make appt 4 weeks from today with labs and see md to see how she is doing.

## 2016-01-08 NOTE — Telephone Encounter (Signed)
Patient has decided not to do radiation and cancelled appt w/Chrystal. She is ready to start hormone treatment and would like Judeen Hammans to call her to answer any questions she has. Please call and if you do not get her leave a detailed msg. Thanks!

## 2016-01-13 ENCOUNTER — Ambulatory Visit: Payer: PPO | Admitting: Radiation Oncology

## 2016-01-13 ENCOUNTER — Ambulatory Visit: Admission: RE | Admit: 2016-01-13 | Payer: PPO | Source: Ambulatory Visit | Admitting: Radiation Oncology

## 2016-01-13 ENCOUNTER — Institutional Professional Consult (permissible substitution): Payer: PPO | Admitting: Radiation Oncology

## 2016-01-24 DIAGNOSIS — C50112 Malignant neoplasm of central portion of left female breast: Secondary | ICD-10-CM | POA: Diagnosis not present

## 2016-01-24 DIAGNOSIS — Z4432 Encounter for fitting and adjustment of external left breast prosthesis: Secondary | ICD-10-CM | POA: Diagnosis not present

## 2016-01-30 ENCOUNTER — Telehealth: Payer: Self-pay | Admitting: Hematology and Oncology

## 2016-01-30 DIAGNOSIS — C50912 Malignant neoplasm of unspecified site of left female breast: Secondary | ICD-10-CM

## 2016-01-30 MED ORDER — LETROZOLE 2.5 MG PO TABS: 2.5000 mg | ORAL_TABLET | Freq: Every day | ORAL | Status: DC

## 2016-01-30 NOTE — Telephone Encounter (Signed)
She won't see MD again until a week after her pills run out. She has 10 more pills and sees MD on 02/12/16. It is Letrozole. She said it says no refills on the bottle Please advise if she should just stop taking or whether she needs a refill. Please call: [phone]

## 2016-01-30 NOTE — Telephone Encounter (Signed)
I spoke with pt and refilled her Letrozole to [facility] in [place] as requested

## 2016-02-02 ENCOUNTER — Encounter: Payer: Self-pay | Admitting: Hematology and Oncology

## 2016-02-12 ENCOUNTER — Encounter: Payer: Self-pay | Admitting: Hematology and Oncology

## 2016-02-12 ENCOUNTER — Inpatient Hospital Stay (HOSPITAL_BASED_OUTPATIENT_CLINIC_OR_DEPARTMENT_OTHER): Payer: PPO | Admitting: Hematology and Oncology

## 2016-02-12 ENCOUNTER — Inpatient Hospital Stay: Payer: PPO | Attending: Hematology and Oncology

## 2016-02-12 VITALS — BP 123/84 | HR 69 | Temp 98.4°F | Resp 18 | Ht 64.0 in | Wt 137.3 lb

## 2016-02-12 DIAGNOSIS — C50912 Malignant neoplasm of unspecified site of left female breast: Secondary | ICD-10-CM

## 2016-02-12 DIAGNOSIS — N189 Chronic kidney disease, unspecified: Secondary | ICD-10-CM | POA: Diagnosis not present

## 2016-02-12 DIAGNOSIS — M1990 Unspecified osteoarthritis, unspecified site: Secondary | ICD-10-CM | POA: Diagnosis not present

## 2016-02-12 DIAGNOSIS — Z9012 Acquired absence of left breast and nipple: Secondary | ICD-10-CM

## 2016-02-12 DIAGNOSIS — Z79811 Long term (current) use of aromatase inhibitors: Secondary | ICD-10-CM | POA: Diagnosis not present

## 2016-02-12 DIAGNOSIS — I129 Hypertensive chronic kidney disease with stage 1 through stage 4 chronic kidney disease, or unspecified chronic kidney disease: Secondary | ICD-10-CM | POA: Diagnosis not present

## 2016-02-12 DIAGNOSIS — M2550 Pain in unspecified joint: Secondary | ICD-10-CM | POA: Diagnosis not present

## 2016-02-12 DIAGNOSIS — Z79899 Other long term (current) drug therapy: Secondary | ICD-10-CM | POA: Insufficient documentation

## 2016-02-12 DIAGNOSIS — Z170 Estrogen receptor positive status [ER+]: Secondary | ICD-10-CM

## 2016-02-12 DIAGNOSIS — Z85850 Personal history of malignant neoplasm of thyroid: Secondary | ICD-10-CM

## 2016-02-12 DIAGNOSIS — E890 Postprocedural hypothyroidism: Secondary | ICD-10-CM | POA: Diagnosis not present

## 2016-02-12 DIAGNOSIS — Z7982 Long term (current) use of aspirin: Secondary | ICD-10-CM | POA: Insufficient documentation

## 2016-02-12 DIAGNOSIS — I252 Old myocardial infarction: Secondary | ICD-10-CM | POA: Insufficient documentation

## 2016-02-12 DIAGNOSIS — R232 Flushing: Secondary | ICD-10-CM | POA: Insufficient documentation

## 2016-02-12 DIAGNOSIS — M810 Age-related osteoporosis without current pathological fracture: Secondary | ICD-10-CM | POA: Diagnosis not present

## 2016-02-12 DIAGNOSIS — C773 Secondary and unspecified malignant neoplasm of axilla and upper limb lymph nodes: Secondary | ICD-10-CM | POA: Diagnosis not present

## 2016-02-12 DIAGNOSIS — Z800 Family history of malignant neoplasm of digestive organs: Secondary | ICD-10-CM | POA: Insufficient documentation

## 2016-02-12 LAB — HEPATIC FUNCTION PANEL
ALT: 7 U/L — ABNORMAL LOW (ref 14–54)
AST: 17 U/L (ref 15–41)
Albumin: 4 g/dL (ref 3.5–5.0)
Alkaline Phosphatase: 82 U/L (ref 38–126)
Bilirubin, Direct: <0.1 mg/dL — ABNORMAL LOW (ref 0.1–0.5)
Total Bilirubin: 0.6 mg/dL (ref 0.3–1.2)
Total Protein: 7 g/dL (ref 6.5–8.1)

## 2016-02-12 NOTE — Progress Notes (Signed)
[facility] day:  02/12/2016  Chief Complaint: Kim Santiago is a 81 y.o. female with stage IIB left breast cancer who is seen for review of MammaPrint results and further discussions regarding direction of therapy.  HPI:  The patient was last seen in the medical oncology clinic on 01/01/2016.  At that time, MammaPrint on 12/18/2015 revealed a low risk of recurrence.  Decision was made to proceed with hormonal therapy (no chemotherapy).  She was to have followed up with radiation oncology.    She states that she decided against radiation and didn't keep her appointment.  She states that she "had no peace about radiation".  She began Femara on 01/09/2016. She is had a few hot flashes. She notes a little joint pain. She is taking extra strength Tylenol as need.  She is taking her calcium and vitamin D.   Past Medical History  Diagnosis Date  . Menopause 40s    natural, hot flashes and mood lability now gone, off prempro 7 months  . hypothyroidism     secondary to thyroidectomy for thyroid ca  . Hypertension   . Myocardial infarction ([place]) 2013  . Stress-induced cardiomyopathy September of 2013    EF 35%. Peak troponin was 1.8.  . Osteoporosis     Osteopenia  . Sjoegren syndrome ([place])   . Rosacea   . Hypothyroidism   . Chronic kidney disease     UTI  . Arthritis   . Cancer [facility])     thyroid takes levothyroxine  . Genetic screening March 2017.    Mammoprint of left breast cancer: Low risk for recurrence.     Past Surgical History  Procedure Laterality Date  . Kyphosis surgery  Feb 2008    L1, Dr. Mauri Pole  . Cholecystectomy    . Tonsillectomy    . Tubal ligation    . Lumbar disc surgery      L4-L5  . Shoulder arthroscopy  2004    Left, Dr. Jefm Bryant  . Thyroidectomy      Thyroid Cancer  . Spine surgery      L4-5 diskectomy  . Cardiac catheterization  05/2012    [facility]. No significant CAD. Ejection fraction of 35% due to  stress-induced cardiomyopathy.  . Dilation and curettage of uterus    . Back surgery    . Simple mastectomy with axillary sentinel node biopsy Left 11/18/2015    Procedure: SIMPLE MASTECTOMY;  Surgeon: Robert Bellow, MD;  Location: [facility] ORS;  Service: General;  Laterality: Left;  . Sentinel node biopsy Left 11/18/2015    Procedure: SENTINEL NODE BIOPSY;  Surgeon: Robert Bellow, MD;  Location: [facility] ORS;  Service: General;  Laterality: Left;  . Breast biopsy Left 10/30/15    positive, done in Dr. Dwyane Luo office    Family History  Problem Relation Age of Onset  . COPD Father   . Cancer Father     esophageal  . Kidney disease Mother   . Heart disease Mother   . Kidney disease Sister   . Cancer Brother 85    colon cancer (both brothers)  . Heart attack Brother 110    Social History:  reports that she has never smoked. She has never used smokeless tobacco. She reports that she does not drink alcohol or use illicit drugs.  She is from [place], [place].  She was married for 64 years.  Her husband died 68 months ago.  She lives alone in  [place].  She has an adopted daughter and 2 grandchildren.  Her brother died of a MI following a cholecystectomy in 12/2015.  The patient is accompanied by her daughter, [name].  Allergies:  Allergies  Allergen Reactions  . Amoxicillin Rash  . Codeine Nausea And Vomiting  . Naprosyn [Naproxen] Swelling  . Orudis [Ketoprofen] Hives  . Sulfathiazole Rash    Current Medications: Current Outpatient Prescriptions  Medication Sig Dispense Refill  . acetaminophen (TYLENOL) 650 MG CR tablet Take 650 mg by mouth every 8 (eight) hours as needed for pain.    Marland Kitchen aspirin 81 MG tablet Take 81 mg by mouth daily.    . Calcium Carbonate-Vitamin D (CALCIUM + D) 600-200 MG-UNIT TABS Take 1 tablet by mouth 2 (two) times daily.     . cholecalciferol (VITAMIN D) 1000 UNITS tablet Take 1,000 Units by mouth daily.      . furosemide (LASIX) 20 MG tablet Once daily as  needed for fluid retention 90 tablet 1  . letrozole (FEMARA) 2.5 MG tablet Take 1 tablet (2.5 mg total) by mouth daily. 30 tablet 0  . levothyroxine (SYNTHROID, LEVOTHROID) 88 MCG tablet Take 1 tablet (88 mcg total) by mouth daily. 90 tablet 4  . losartan (COZAAR) 25 MG tablet Take 1 tablet (25 mg total) by mouth daily. 90 tablet 4  . metoprolol succinate (TOPROL XL) 25 MG 24 hr tablet Take 1 tablet (25 mg total) by mouth daily. 90 tablet 4  . Multiple Vitamins-Minerals (OSTEO COMPLEX PO) Take 1 tablet by mouth daily. Take one by mouth daily    . omeprazole (PRILOSEC) 20 MG capsule Take 1 capsule (20 mg total) by mouth daily. 90 capsule 4  . polyethylene glycol (MIRALAX / GLYCOLAX) packet Take 17 g by mouth daily.    Marland Kitchen triamcinolone cream (KENALOG) 0.1 % Apply 1 application topically 2 (two) times daily. 30 g 0  . vitamin B-12 (CYANOCOBALAMIN) 1000 MCG tablet Take 1,000 mcg by mouth daily.     No current facility-administered medications for this visit.    Review of Systems:  GENERAL:  Feels "ok".  No fevers or sweats.  Weight up 2 pounds. PERFORMANCE STATUS (ECOG):  1 HEENT:  No visual changes, runny nose, sore throat, mouth sores or tenderness. Lungs: No shortness of breath or cough.  No hemoptysis. Cardiac:  No chest pain, palpitations, orthopnea, or PND. GI:  No nausea, vomiting, diarrhea, constipation, melena or hematochezia. GU:  No urgency, frequency, dysuria, or hematuria. Musculoskeletal:  No back pain.  Knee issues.  No muscle tenderness. Extremities:  No pain or swelling. Skin:  No rashes or skin changes. Neuro:  No headache, numbness or weakness, balance or coordination issues. Endocrine:  No diabetes.  Thyroid disease on Synthroid.  Few hot flashes.  No night sweats. Psych:  No mood changes, anxiety or depression.  Pain:  No focal pain. Review of systems:  All other systems reviewed and found to be negative.  Physical Exam: Blood pressure 123/84, pulse 69, temperature  98.4 F (36.9 C), temperature source Tympanic, resp. rate 18, height '5\' 4"'$  (1.626 m), weight 137 lb 5.6 oz (62.3 kg). GENERAL:  Well developed, well nourished, woman sitting comfortably in the exam room in no acute distress. MENTAL STATUS:  Alert and oriented to person, place and time. HEAD:  Short gray hair.  Normocephalic, atraumatic, face symmetric, no Cushingoid features. EYES:  Glasses.  Blue eyes.  Pupils equal round and reactive to light and accomodation. No conjunctivitis or scleral icterus.  ENT: Oropharynx clear without lesion. Tongue normal. Mucous membranes moist.  RESPIRATORY: Clear to auscultation without rales, wheezes or rhonchi. CARDIOVASCULAR: Regular rate and rhythm without murmur, rub or gallop. No JVD. ABDOMEN: Soft, non-tender, with active bowel sounds, and no hepatosplenomegaly. No masses. SKIN: No rashes, ulcers or lesions. EXTREMITIES: No edema, no skin discoloration or tenderness. No palpable cords. LYMPH NODES:  No palpable cervical, supraclavicular, axillary or inguinal adenopathy  NEUROLOGICAL: Unremarkable. PSYCH: Appropriate.   No visits with results within 3 Day(s) from this visit. Latest known visit with results is:  Admission on 11/18/2015, Discharged on 11/18/2015  Component Date Value Ref Range Status  . SURGICAL PATHOLOGY 11/18/2015    Final-Edited                   Value:Surgical Pathology THIS IS AN ADDENDUM REPORT CASE: [id number] PATIENT: [facility] Surgical Pathology Report Addendum  Reason for Addendum #1:  Molecular studies  SPECIMEN SUBMITTED: A. Breast, left B. Sentinel lymph node #1  #2  CLINICAL HISTORY: None provided  PRE-OPERATIVE DIAGNOSIS: Left breast cancer  POST-OPERATIVE DIAGNOSIS: Same as pre-op     DIAGNOSIS: A. LEFT BREAST; TOTAL MASTECTOMY: - SOLITARY INVASIVE MAMMARY CARCINOMA OF NO SPECIAL TYPE, 3.9 CM. - DUCTAL CARCINOMA IN SITU. - LYMPH VASCULAR AND DERMAL LYMPHATIC INVASION  IDENTIFIED. - THE SURGICAL MARGINS ARE NEGATIVE. - MICROCALCIFICATIONS IN BENIGN AND MALIGNANT TISSUE. - THREE BENIGN AXILLARY LYMPH NODES (0/3). - SEE SUMMARY BELOW.  B. SENTINEL LYMPH NODE #1 AND 2; EXCISION: - ONE OF TWO LYMPH NODES POSITIVE FOR MACRO METASTASES (1/2).   INVASIVE CARCINOMA OF THE BREAST: Complete Excision Specimens InvolvedA: Breast, left B: Sentinel lymph nod                         e #1  #2  SPECIMEN Procedure:     Total mastectomy (including nipple and skin) Lymph Node Sampling:     Sentinel lymph node(s) Specimen Laterality:     Left TUMOR Presence of Invasive Carcinoma:    Histologic Type: Invasive mammary carcinoma of no special type (ductal, not otherwise specified) Histologic Grade:   Glandular (acinar) / tubular differentiation: Score 3 (< 10% of tumor area forming glandular / tubular structures) Nuclear pleomorphism: Score 2 (cells larger than normal with open vesicular nuclei, visible nucleoli, and moderate variability in both size and shape) Mitotic rate:  Score 1 (<=3 mitoses per mm2) Overall grade: Grade 2 (scores of 6 or 7) Ductal Carcinoma In Situ (DCIS):   DCIS is present Negative for extensive intraductal component (EIC) Tumor Size / Focality:   Tumor size: size of largest invasive carcinoma: Greatest dimension of largest focus of invasion > 1 mm: 36m Tumor focality:     Single focus of invasive carcinoma Tumor Extent Macr                         oscopic and Microscopic Extent of Tumor Skin:     Skin invasion: Invasive carcinoma directly invades into the dermis or epidermis without skin ulceration (this does not change the T stage) Skin satellite foci:     Negative for satellite foci Nipple DCIS:   DCIS does not involve the nipple epidermis Skeletal muscle:    Specimen does not contain skeletal muscle Accessory Tumor Findings Lymph-Vascular Invasion: Present Treatment Effect: Response to Presurgical (Neoadjuvant) Therapy: No known  presurgical therapy MARGINS Invasive Carcinoma: Margins negative for invasive carcinoma Distance from closest margin: Distance  is > 10 mm Closest Negative Margin(s):   Posterior Ductal Carcinoma In Situ (DCIS):   Margins negative for DCIS (DCIS present in specimen) Distance of DCIS from closest margin:   Distance is > 10 mm Closest Negative Margin(s):   Posterior LYMPH NODES Regional Lymph Nodes:    Number of Sentinel Nodes Examined: Specify number 2 Number of lymph node(s) exam                         ined:  Number 5 Lymph node involvement:  Number of lymph nodes with macrometastases (> 2 mm): Number 1 Number of lymph nodes with micrometastases: Number 0 Number of lymph nodes with isolated tumor cells (<= 0.2 mm and <= 200 cells):   Number 0 STAGE (pTNM) TNM Descriptors:    Not applicable n/a Primary Tumor (Invasive Carcinoma) (pT):     pT2:  tumor > 20 mm but <= 50 mm in greatest dimension Regional lymph nodes (pN) (choose a category based on lymph nodes received with the specimen;  immunohistochemistry and / or molecular studies are not required) Category (pN): pN1a: metastases in 1 to 3 axillary lymph nodes, at least 1 metastasis greater than 2 mm Distant Metastasis (pM): Not applicable - pM cannot be determined from the submitted specimen(s) Comment(s): Invasive carcinoma is identified in tissue between the two masses. The tumor is entirely submitted and is present in 13 slides.   GROSS DESCRIPTION: A. Labeled: Left breast  Time in fixative: 3:10                          PM on 11/18/2015  Cold ischemic time: 40 minutes  Total fixation time: 6 1/2 Hours  Type of mastectomy: Simple  Laterality: Left  Weight of specimen: 707 grams  Size of specimen: 23.5 x 20.5 x 3.9 cm  Orientation of specimen: The letters M and L are written in surgical marker on the skin surface to designate the medial and lateral aspects specimen. Anterior half specimen inked blue,  posterior specimen inked black.  Skin ellipse dimensions  description: 15.5 x 11.2 cm, unremarkable  Nipple/ areola: Nipple 1 cm in diameter, flattened. Areola 4.3 cm in diameter, unremarkable  Axillary tail: Absent  Biopsy site(s): 2 discrete biopsy sites identified  Number of discrete masses: 2  Location of mass(es): One mass is located approximately at the 6:00 position behind the nipple areolar complex, and the other is located in the approximate 1:00 position in the upper outer quadrant.  Distance between masses: Approximately 1.8 cm  Size of mass(es)/biopsy site(s):                          First mass approximately 1 x 0.9 x 0.5 cm, and second mass approximately 1 x 0.7 x 0.8 cm  Description of mass(es)/biopsy site(s): The first mass is surrounded by a large amount of fibrous tissue measures approximately 2.5 x 2.5 x 1.5 cm. Both the first and second masses are extremely poorly defined, stellate, pale tan to white.  Margins: The first mass is grossly located 3.5 cm from the deep margin and the second mass is grossly located 3 cm from the deep margin  Gross involvement of skin/fascia/muscle by tumor: Absent  Description of remaining breast: The rest the breast tissue is predominantly fatty with interspersed areas of dense white fibrous tissue  Lymph nodes: On dissection 3 lymph node candidates identified  from 0.5 cm to 1.2 x 0.8 x 0.5 cm.  Block Summary: 1-skin 2-nipple 3-deep margin of resection 4-9 entire first mass including biopsy cavity and adjacent fibrous tissue at the left breast mass 10-12-entire second mass 13-16-breast tissue between                          first and second mass 17-representative section upper inner quadrant 18-representative section lower inner quadrant 19-representative section upper outer quadrant 20-representative section lower outer quadrant 21- 2 lymph node candidates submitted on 11/20/15 22-1 sectioned lymph node  candidate submitted on 11/20/15  B. Labeled: Sentinel node #1 and 2  Tissue fragment(s): 2  Size: 0.4 x 0.3 x 0.2 cm and 1.2 x 0.6 x 0.6 cm  Description: 2 pink red lymph node candidates covered by adipose tissue, smaller inked blue  Entirely submitted in one cassette(s).         Final Diagnosis performed by Delorse Lek, MD.  Electronically signed 11/21/2015 2:59:43PM   The electronic signature indicates that the named Attending Pathologist has evaluated the specimen  Technical component performed at [facility], [address] [place], [place] [address] Lab: [phone] Dir: Darrick Penna. Evette Doffing, MD  Professional component performed at [facility], [facility],                          [place], [place], [place] [address] Lab: [phone] Dir: Dellia Nims. Reuel Derby, MD   ADDENDUM: At Dr. Kem Parkinson request, FFPE tumor tissue (block A8) was submitted to [place], [place], for MammaPrint testing, a 70-gene expression profile. The assay result is reported as Low Risk, +0.192. Please see separate report, requisition number [id number], in [place].    Addendum #1 performed by Bryan Lemma, MD.  Electronically signed 12/24/2015 12:25:01PM    Technical component performed at [facility], [address], [place], [place] [address] Lab: [phone] Dir: Darrick Penna. Evette Doffing, MD  Professional component performed at [facility], [facility], [place], [place], [place] [address] Lab: [phone] Dir: Dellia Nims. Rubinas, MD      Assessment:  TESS POTTS is a 81 y.o. female with stage IIB left breast cancer s/p mastectomy on 11/07/2015.  Pathology revealed a 3.9 cm grade II invasive mammary carcinoma.  There was ductal carcinoma in situ (DCIS).  There was lymphvascular and dermal lymphatic invasion.  Margins were negative.  One of 2 sentinel lymph nodes were positive for macrometastasis.  In addition, there were 3 benign axillary lymph nodes (total  5 lymph nodes).  Tumor was estrogen receptor positive (100%), progesterone receptor positive (70%), and Her2/neu negative.  Pathologic stage was T2N1a.  CA27.29 was 30.4 on 11/15/2015.  MammaPrint on 12/18/2015 revealed a low risk of recurrence with a 5% risk of distant recurrence of 5 years and a 10% recurrence at 10 years.  She declined radiation.  She began Femara on 01/09/2016.  Bone density study on 12/05/2015 revealed osteopenia with a T-score of -1.5 in the left femoral neck.    She has a history of a myocardial infarction in 06/2012.  Echocardiogram revealed an EF of 35%.  Follow-up echo on 09/21/2015 revealed an EF of 55-60%.  Patient is unaware of a history of heart failure.   She has a history of thyroid carcinoma 40 years ago treated with I-131.   Symptomatically, she is tolerating Femara well.  She has a few hot flashes.  Exam is unremarkable.  Plan:  1.  Labs today:  LFTs. 2.  Discuss patient's decision not to pursue radiation. 3.  Continue Femara 2.5 mg po q day. 4.  RTC in 3 months for MD assessment and labs (CBC with diff, CMP, CA27.29).   Lequita Asal, MD  02/12/2016 , 9:51 AM

## 2016-02-12 NOTE — Progress Notes (Signed)
Pt tolerating letrozole very well. She has occ. Hot flashes but very manageable. She does state that she does have BM every day and mostly soft but sometimes with runny/diarrhea consistency.  Not on regular basis.  I advised her that if it happens on regular basis she may want to try 1/2 dose miralax to see if she still has BM but softer and no diarrhea.  She will keep a log of it.

## 2016-02-17 ENCOUNTER — Other Ambulatory Visit: Payer: Self-pay | Admitting: *Deleted

## 2016-02-17 DIAGNOSIS — C50912 Malignant neoplasm of unspecified site of left female breast: Secondary | ICD-10-CM

## 2016-02-25 ENCOUNTER — Other Ambulatory Visit: Payer: Self-pay | Admitting: *Deleted

## 2016-02-25 DIAGNOSIS — C50912 Malignant neoplasm of unspecified site of left female breast: Secondary | ICD-10-CM

## 2016-02-25 MED ORDER — LETROZOLE 2.5 MG PO TABS: 2.5000 mg | ORAL_TABLET | Freq: Every day | ORAL | Status: DC

## 2016-03-11 ENCOUNTER — Encounter: Payer: Self-pay | Admitting: General Surgery

## 2016-03-11 ENCOUNTER — Ambulatory Visit (INDEPENDENT_AMBULATORY_CARE_PROVIDER_SITE_OTHER): Payer: PPO | Admitting: Family Medicine

## 2016-03-11 ENCOUNTER — Encounter: Payer: Self-pay | Admitting: Family Medicine

## 2016-03-11 ENCOUNTER — Ambulatory Visit (INDEPENDENT_AMBULATORY_CARE_PROVIDER_SITE_OTHER): Payer: PPO | Admitting: General Surgery

## 2016-03-11 VITALS — BP 130/68 | HR 77 | Resp 12 | Ht 64.0 in | Wt 137.0 lb

## 2016-03-11 VITALS — BP 120/72 | HR 90 | Temp 97.8°F | Ht 64.0 in | Wt 139.0 lb

## 2016-03-11 DIAGNOSIS — H9193 Unspecified hearing loss, bilateral: Secondary | ICD-10-CM

## 2016-03-11 DIAGNOSIS — C50912 Malignant neoplasm of unspecified site of left female breast: Secondary | ICD-10-CM

## 2016-03-11 DIAGNOSIS — R42 Dizziness and giddiness: Secondary | ICD-10-CM

## 2016-03-11 NOTE — Patient Instructions (Signed)
We will call with your appt.  Take care  Dr. Lacinda Axon

## 2016-03-11 NOTE — Progress Notes (Signed)
Patient ID: Kim Santiago, female   DOB: 05/20/1935, 81 y.o.   MRN: [id number]  No chief complaint on file.   HPI Kim Santiago is a 82 y.o. female here for a breast cancer follow up. She states no new breast issues. She states she has been experiencing some fatigue and nausea which she thinks is a side effect from taking Femara. She has been taking it for 2 months.  She has been taking afternoon naps which helps with her general sense of well-being.  I personally reviewed the patient's history.  HPI  Past Medical History  Diagnosis Date  . Menopause 40s    natural, hot flashes and mood lability now gone, off prempro 7 months  . hypothyroidism     secondary to thyroidectomy for thyroid ca  . Hypertension   . Myocardial infarction ([place]) 2013  . Stress-induced cardiomyopathy September of 2013    EF 35%. Peak troponin was 1.8.  . Osteoporosis     Osteopenia  . Sjoegren syndrome ([place])   . Rosacea   . Hypothyroidism   . Chronic kidney disease     UTI  . Arthritis   . Genetic screening March 2017.    Mammoprint of left breast cancer: Low risk for recurrence.   . Cancer ([place])     thyroid takes levothyroxine  . Breast cancer in female [facility]) November 18, 2015    Left: 3.9 cm tumor, T2, 1/2 sentinel nodes positive for macro metastatic disease, N1, 3 negative nodes in the axillary tail., low Mammoprint score    Past Surgical History  Procedure Laterality Date  . Kyphosis surgery  Feb 2008    L1, Dr. Mauri Pole  . Cholecystectomy    . Tonsillectomy    . Tubal ligation    . Lumbar disc surgery      L4-L5  . Shoulder arthroscopy  2004    Left, Dr. Jefm Bryant  . Thyroidectomy      Thyroid Cancer  . Spine surgery      L4-5 diskectomy  . Cardiac catheterization  05/2012    [facility]. No significant CAD. Ejection fraction of 35% due to stress-induced cardiomyopathy.  . Dilation and curettage of uterus    . Back surgery    . Simple mastectomy with axillary sentinel node biopsy Left  11/18/2015    Procedure: SIMPLE MASTECTOMY;  Surgeon: Robert Bellow, MD;  Location: [facility] ORS;  Service: General;  Laterality: Left;  . Sentinel node biopsy Left 11/18/2015    Procedure: SENTINEL NODE BIOPSY;  Surgeon: Robert Bellow, MD;  Location: [facility] ORS;  Service: General;  Laterality: Left;  . Breast biopsy Left 10/30/15    positive, done in Dr. Dwyane Luo office  . Colonoscopy      Family History  Problem Relation Age of Onset  . COPD Father   . Cancer Father     esophageal  . Kidney disease Mother   . Heart disease Mother   . Kidney disease Sister   . Cancer Brother 17    colon cancer (both brothers)  . Heart attack Brother 23    Social History Social History  Substance Use Topics  . Smoking status: Never Smoker   . Smokeless tobacco: Never Used  . Alcohol Use: No    Allergies  Allergen Reactions  . Amoxicillin Rash  . Codeine Nausea And Vomiting  . Naprosyn [Naproxen] Swelling  . Orudis [Ketoprofen] Hives  . Sulfathiazole Rash    Current Outpatient Prescriptions  Medication Sig Dispense  Refill  . acetaminophen (TYLENOL) 650 MG CR tablet Take 650 mg by mouth every 8 (eight) hours as needed for pain.    Marland Kitchen aspirin 81 MG tablet Take 81 mg by mouth daily.    . Calcium Carbonate-Vitamin D (CALCIUM + D) 600-200 MG-UNIT TABS Take 1 tablet by mouth 2 (two) times daily.     . cholecalciferol (VITAMIN D) 1000 UNITS tablet Take 1,000 Units by mouth daily.      . furosemide (LASIX) 20 MG tablet Once daily as needed for fluid retention 90 tablet 1  . letrozole (FEMARA) 2.5 MG tablet Take 1 tablet (2.5 mg total) by mouth daily. 90 tablet 1  . levothyroxine (SYNTHROID, LEVOTHROID) 88 MCG tablet Take 1 tablet (88 mcg total) by mouth daily. 90 tablet 4  . losartan (COZAAR) 25 MG tablet Take 1 tablet (25 mg total) by mouth daily. 90 tablet 4  . metoprolol succinate (TOPROL XL) 25 MG 24 hr tablet Take 1 tablet (25 mg total) by mouth daily. 90 tablet 4  . Multiple  Vitamins-Minerals (OSTEO COMPLEX PO) Take 1 tablet by mouth daily. Take one by mouth daily    . omeprazole (PRILOSEC) 20 MG capsule Take 1 capsule (20 mg total) by mouth daily. 90 capsule 4  . polyethylene glycol (MIRALAX / GLYCOLAX) packet Take 17 g by mouth daily.    Marland Kitchen triamcinolone cream (KENALOG) 0.1 % Apply 1 application topically 2 (two) times daily. 30 g 0  . vitamin B-12 (CYANOCOBALAMIN) 1000 MCG tablet Take 1,000 mcg by mouth daily.     No current facility-administered medications for this visit.    Review of Systems Review of Systems  Constitutional: Negative.   Respiratory: Negative.   Cardiovascular: Negative.     Blood pressure 130/68, pulse 77, resp. rate 12, height 5\' 4"  (1.626 m), weight 137 lb (62.143 kg).  Physical Exam Physical Exam  Constitutional: She is oriented to person, place, and time. She appears well-developed and well-nourished.  HENT:  Mouth/Throat: Oropharynx is clear and moist.  Eyes: Conjunctivae are normal. No scleral icterus.  Neck: Neck supple.  Cardiovascular: Normal rate, regular rhythm and normal heart sounds.   Pulmonary/Chest: Effort normal and breath sounds normal. Right breast exhibits no inverted nipple, no mass, no nipple discharge, no skin change and no tenderness.  Redundant skin left mastectomy site, incision well healed.  Lymphadenopathy:    She has no cervical adenopathy.    She has no axillary adenopathy.  Neurological: She is alert and oriented to person, place, and time.  Skin: Skin is warm and dry.  Psychiatric: Her behavior is normal.       Assessment    Doing well status post left mastectomy.    Plan           Continue Femara The patient has been asked to return to the office in 10 months with a unilateral right breast screening mammogram.   PCP:  Kim Santiago  This information has been scribed by Karie Fetch RN, BSN,BC.   Robert Bellow 03/11/2016, 9:07 PM

## 2016-03-11 NOTE — Patient Instructions (Addendum)
The patient is aware to call back for any questions or concerns. Continue Femara

## 2016-03-11 NOTE — Progress Notes (Signed)
Pre visit review using our clinic review tool, if applicable. No additional management support is needed unless otherwise documented below in the visit note. 

## 2016-03-12 DIAGNOSIS — H9190 Unspecified hearing loss, unspecified ear: Secondary | ICD-10-CM | POA: Insufficient documentation

## 2016-03-12 DIAGNOSIS — R42 Dizziness and giddiness: Secondary | ICD-10-CM | POA: Insufficient documentation

## 2016-03-12 NOTE — Assessment & Plan Note (Addendum)
New problem. Unclear etiology/prognosis. This does not appear to be consistent with acute sensorineural hearing loss as she reports that it is bilateral and it has not progressed.  She is able to hear me well throughout the office visit today. Canals and TMs normal. Sending to ENT for evaluation.

## 2016-03-12 NOTE — Assessment & Plan Note (Addendum)
New problem. One isolated episode of vertigo. Has had no recurrence. It is reported side effect of a Letrozole which she recently started.  She reports that she has had hearing loss as well (see below).  Given the fact that this is only happened a single time, Mnire's is unlikely. I suspect this is just one episode of BPPV or perhaps vertigo from her new medication. No other neurologic deficits/symptoms to suggest underlying TIA/Stroke/other neurological disease. Supportive care.

## 2016-03-12 NOTE — Progress Notes (Addendum)
Subjective:  Patient ID: Kim Santiago, female    DOB: April 08, 1935  Age: 81 y.o. MRN: [id number]  CC: Hearing loss, Vertigo  HPI:  81 year old female with a past medical history of Sjogren's syndrome, CAD, breast cancer (currently on Letrozole) presents with the above complaints.  Patient states that she has had difficulty hearing for the past week. Patient states that it's more difficult for her to hear soft sounds, particularly the telephone ringing and with telephone conversations. She states that the left is worse than the right. She is still able to hear just not as well.  Additionally, she reports one episode of vertigo. She states that she developed sudden onset "spinning" and feelings of imbalance. This subsequently resolved quickly and has not recurred since. She reports that she had some associated nausea. No vomiting. She took a nausea medication with improvement.  Patient is concerned about her symptoms and would like to know why they're happening and what to do.  Social Hx   Social History   Social History  . Marital Status: Widowed    Spouse Name: N/A  . Number of Children: N/A  . Years of Education: N/A   Social History Main Topics  . Smoking status: Never Smoker   . Smokeless tobacco: Never Used  . Alcohol Use: No  . Drug Use: No  . Sexual Activity: No   Other Topics Concern  . None   Social History Narrative   Review of Systems  Constitutional: Negative.   HENT: Positive for hearing loss.   Neurological: Positive for dizziness.   Objective:  BP 120/72 mmHg  Pulse 90  Temp(Src) 97.8 F (36.6 C) (Oral)  Ht 5\' 4"  (1.626 m)  Wt 139 lb (63.05 kg)  BMI 23.85 kg/m2  SpO2 98%  BP/Weight 03/11/2016 [id number]  Systolic BP [id number]  Diastolic BP 72 68 84  Wt. (Lbs) 139 137 137.35  BMI 23.85 23.5 23.56   Physical Exam  Constitutional: She is oriented to person, place, and time. She appears well-developed. No distress.  HENT:  Head:  Normocephalic and atraumatic.  Normal canals. Normal TMs bilaterally.  Cardiovascular: Normal rate and regular rhythm.   Pulmonary/Chest: Effort normal. She has no wheezes. She has no rales.  Neurological: She is alert and oriented to person, place, and time.  No apparent focal deficits. Clinically, patient able to hear my voice and have a normal uninterrupted conversation.  Psychiatric: She has a normal mood and affect.  Vitals reviewed.  Lab Results  Component Value Date   WBC 3.9* 12/26/2015   HGB 12.1 12/26/2015   HCT 35.7* 12/26/2015   PLT 206.0 12/26/2015   GLUCOSE 97 12/26/2015   CHOL 160 12/26/2015   TRIG 88.0 12/26/2015   HDL 45.20 12/26/2015   LDLDIRECT 117.6 12/27/2012   LDLCALC 97 12/26/2015   ALT 7* 02/12/2016   AST 17 02/12/2016   NA 136 12/26/2015   K 5.1 12/26/2015   CL 100 12/26/2015   CREATININE 0.66 12/26/2015   BUN 16 12/26/2015   CO2 31 12/26/2015   TSH 3.25 12/26/2015   Assessment & Plan:   Problem List Items Addressed This Visit    Vertigo - Primary    New problem. One isolated episode of vertigo. Has had no recurrence. It is reported side effect of a Letrozole which she recently started.  She reports that she has had hearing loss as well (see below).  Given the fact that this is only happened a single time,  Mnire's is unlikely. I suspect this is just one episode of BPPV or perhaps vertigo from her new medication. No other neurologic deficits/symptoms to suggest underlying TIA/Stroke/other neurological disease. Supportive care.       Relevant Orders   Ambulatory referral to ENT   Hearing loss    New problem. Unclear etiology/prognosis. This does not appear to be consistent with acute sensorineural hearing loss as she reports that it is bilateral and it has not progressed.  She is able to hear me well throughout the office visit today. Canals and TMs normal. Sending to ENT for evaluation.      Relevant Orders   Ambulatory referral to  ENT     Follow-up: PRN  [place]

## 2016-03-30 ENCOUNTER — Ambulatory Visit (INDEPENDENT_AMBULATORY_CARE_PROVIDER_SITE_OTHER): Payer: PPO | Admitting: Internal Medicine

## 2016-03-30 ENCOUNTER — Encounter: Payer: Self-pay | Admitting: Internal Medicine

## 2016-03-30 ENCOUNTER — Encounter: Payer: PPO | Admitting: Internal Medicine

## 2016-03-30 VITALS — BP 130/74 | HR 72 | Temp 97.9°F | Resp 16 | Ht 63.0 in | Wt 138.0 lb

## 2016-03-30 DIAGNOSIS — M8580 Other specified disorders of bone density and structure, unspecified site: Secondary | ICD-10-CM | POA: Diagnosis not present

## 2016-03-30 DIAGNOSIS — M26629 Arthralgia of temporomandibular joint, unspecified side: Secondary | ICD-10-CM

## 2016-03-30 DIAGNOSIS — D369 Benign neoplasm, unspecified site: Secondary | ICD-10-CM | POA: Diagnosis not present

## 2016-03-30 DIAGNOSIS — Z7189 Other specified counseling: Secondary | ICD-10-CM

## 2016-03-30 DIAGNOSIS — L570 Actinic keratosis: Secondary | ICD-10-CM

## 2016-03-30 DIAGNOSIS — E032 Hypothyroidism due to medicaments and other exogenous substances: Secondary | ICD-10-CM

## 2016-03-30 DIAGNOSIS — M3500 Sicca syndrome, unspecified: Secondary | ICD-10-CM | POA: Diagnosis not present

## 2016-03-30 DIAGNOSIS — Z0000 Encounter for general adult medical examination without abnormal findings: Secondary | ICD-10-CM

## 2016-03-30 MED ORDER — TIZANIDINE HCL 2 MG PO CAPS: 2.0000 mg | ORAL_CAPSULE | Freq: Three times a day (TID) | ORAL | 2 refills | Status: DC

## 2016-03-30 NOTE — Progress Notes (Signed)
Patient ID: Kim Santiago, female    DOB: 1935/08/20  Age: 81 y.o. MRN: [id number]  The patient is here for annual Medicare wellness examination and management of other chronic and acute problems.   Her last Colonoscopy in  2014 was significant for tubular adenoma 5 yr follow 2019 at age of 19 She was diagnosed with Stage IIb left breast cancer in March 2016. Treated with simple mastectomy and SLN biopsy. Mammoprint results low risk for recurrence  Now taking  Femara, followed by surgeon Job Founds and oncologist Nolon Stalls,   Saw [facility] in early June with follow up in September 6th with lab to be done include Ca 27.29.  She has declined XRT after discussion of risks and benefits  With Dr Baruch Gouty and Dr Mike Gip  next mammogram is due in May 2018 of right breast , along with breast exam be DR byrnett.      The risk factors are reflected in the social history.  The roster of all physicians providing medical care to patient - is listed in the Snapshot section of the chart.  Activities of daily living:  The patient is 100% independent in all ADLs: dressing, toileting, feeding as well as independent mobility  Home safety : The patient has smoke detectors in the home. They wear seatbelts.  There are no firearms at home. There is no violence in the home.   There is no risks for hepatitis, STDs or HIV. There is no   history of blood transfusion. They have no travel history to infectious disease endemic areas of the world.  The patient has seen their dentist in the last six month. They have seen their eye doctor in the last year.   They have deferred audiologic testing in the last year.  They do not  have excessive sun exposure. Discussed the need for sun protection: hats, long sleeves and use of sunscreen if there is significant sun exposure.   Diet: the importance of a healthy diet is discussed. They do have a healthy diet.  The benefits of regular aerobic exercise were discussed. She  walks 4 times per week ,  20 minutes.   Depression screen: there are no signs or vegative symptoms of depression- irritability, change in appetite, anhedonia, sadness/tearfullness.  Cognitive assessment: the patient manages all their financial and personal affairs and is actively engaged. They could relate day,date,year and events; recalled 2/3 objects at 3 minutes; performed clock-face test normally.  The following portions of the patient's history were reviewed and updated as appropriate: allergies, current medications, past family history, past medical history,  past surgical history, past social history  and problem list.  Visual acuity was not assessed per patient preference since she has regular follow up with her ophthalmologist. Hearing and body mass index were assessed and reviewed.   During the course of the visit the patient was educated and counseled about appropriate screening and preventive services including : fall prevention , diabetes screening, nutrition counseling, colorectal cancer screening, and recommended immunizations.    CC: Diagnoses of Adenomatous polyp, Advanced directives, counseling/discussion, Multiple actinic keratoses, Osteopenia, Iatrogenic hypothyroidism, TMJ syndrome, Sjogren's syndrome ([place]), and Medicare annual wellness visit, subsequent were pertinent to this visit.   Sw Dr. Lacinda Axon for eval after having an attack of vertigo with nausea which  lasted less than a day, preceded by loss of hearing in left ear . Hearing loss resolved with removal of wax in ear so she cancelled the audiology evaluation ordered by Dr Lacinda Axon. Marland Kitchen  She is plagued by diffuse Actinic  Keratoses which have returned  On chest wall .  Dr . Hinda Kehr had done a procedure , she wonders if she has to go through it again.  Records not available. May need to have procedure repeated  By a covered dermatologist   Haw pain:  Has TMJ left side. Aggravated by chewing gum which she does to relieve the  mouth dryness due to Sjogren's disease.  Discussed prn use of muscle relaxer   Taking B12 oral supplements.    Has been advised vy Dr. Henrietta Hoover to increase her calcium supplements to 1200 mg abc of ongoing use of Femara.  .   Sjogren's disease:  Previosuly her ophthalmologist recommended use of Restasis for dry eye but it was too $$$$ .  She now has a supply from a friend who received multiple suppleis from mail order, wants my permission to try it  Reviewed  Her advance Directives,  Healthcare POA and  MOST forms. Daughter who is POA is present.  One change  made to her Advance directive to clarify her desire to forgo artificial nutrition which now agrees with her choice on the MOST form   History [place] has a past medical history of Arthritis; Breast cancer in female [facility]) (November 18, 2015); Cancer ([place]); Chronic kidney disease; Genetic screening (March 2017.); Hypertension; hypothyroidism; Hypothyroidism; Menopause (14s); Myocardial infarction [facility]) (2013); Osteoporosis; Rosacea; Sjoegren syndrome ([place]); and Stress-induced cardiomyopathy (September of 2013).   She has a past surgical history that includes Kyphosis surgery (Feb 2008); Cholecystectomy; Tonsillectomy; Tubal ligation; Lumbar disc surgery; Shoulder arthroscopy (2004); Thyroidectomy; Spine surgery; Cardiac catheterization (05/2012); Dilation and curettage of uterus; Back surgery; Simple mastectomy with axillary sentinel node biopsy (Left, 11/18/2015); Sentinel node biopsy (Left, 11/18/2015); Breast biopsy (Left, 10/30/15); and Colonoscopy.   Her family history includes COPD in her father; Cancer in her father; Cancer (age of onset: 52) in her brother; Heart attack (age of onset: 12) in her brother; Heart disease in her brother and mother; Kidney disease in her mother and sister.She reports that she has never smoked. She has never used smokeless tobacco. She reports that she does not drink alcohol or use drugs.  Outpatient Medications Prior  to Visit  Medication Sig Dispense Refill  . acetaminophen (TYLENOL) 650 MG CR tablet Take 650 mg by mouth every 8 (eight) hours as needed for pain.    Marland Kitchen aspirin 81 MG tablet Take 81 mg by mouth daily.    . Calcium Carbonate-Vitamin D (CALCIUM + D) 600-200 MG-UNIT TABS Take 2 tablets by mouth 2 (two) times daily.     . cholecalciferol (VITAMIN D) 1000 UNITS tablet Take 2,000 Units by mouth daily.     . furosemide (LASIX) 20 MG tablet Once daily as needed for fluid retention 90 tablet 1  . letrozole (FEMARA) 2.5 MG tablet Take 1 tablet (2.5 mg total) by mouth daily. 90 tablet 1  . levothyroxine (SYNTHROID, LEVOTHROID) 88 MCG tablet Take 1 tablet (88 mcg total) by mouth daily. 90 tablet 4  . losartan (COZAAR) 25 MG tablet Take 1 tablet (25 mg total) by mouth daily. 90 tablet 4  . metoprolol succinate (TOPROL XL) 25 MG 24 hr tablet Take 1 tablet (25 mg total) by mouth daily. 90 tablet 4  . Multiple Vitamins-Minerals (OSTEO COMPLEX PO) Take 1 tablet by mouth daily. Take one by mouth daily    . omeprazole (PRILOSEC) 20 MG capsule Take 1 capsule (20 mg total) by mouth daily.  90 capsule 4  . polyethylene glycol (MIRALAX / GLYCOLAX) packet Take 17 g by mouth daily.    Marland Kitchen triamcinolone cream (KENALOG) 0.1 % Apply 1 application topically 2 (two) times daily. 30 g 0  . vitamin B-12 (CYANOCOBALAMIN) 1000 MCG tablet Take 1,000 mcg by mouth daily.     No facility-administered medications prior to visit.     Review of Systems   Patient denies headache, fevers, malaise, unintentional weight loss, skin rash, eye pain, sinus congestion and sinus pain, sore throat, dysphagia,  hemoptysis , cough, dyspnea, wheezing, chest pain, palpitations, orthopnea, edema, abdominal pain, nausea, melena, diarrhea, constipation, flank pain, dysuria, hematuria, urinary  Frequency, nocturia, numbness, tingling, seizures,  Focal weakness, Loss of consciousness,  Tremor, insomnia, depression, anxiety, and suicidal ideation.       Objective:  BP 130/74 (BP Location: Right Arm, Patient Position: Sitting, Cuff Size: Normal)   Pulse 72   Temp 97.9 F (36.6 C) (Oral)   Resp 16   Ht 5\' 3"  (1.6 m)   Wt 138 lb (62.6 kg)   BMI 24.45 kg/m   Physical Exam  General appearance: alert, cooperative and appears stated age Ears: normal TM's and external ear canals both ears Throat: lips, mucosa, and tongue normal; teeth and gums normal Neck: no adenopathy, no carotid bruit, supple, symmetrical, trachea midline and thyroid not enlarged, symmetric, no tenderness/mass/nodules Back: symmetric, no curvature. ROM normal. No CVA tenderness. Lungs: clear to auscultation bilaterally Heart: regular rate and rhythm, S1, S2 normal, no murmur, click, rub or gallop Abdomen: soft, non-tender; bowel sounds normal; no masses,  no organomegaly Pulses: 2+ and symmetric Skin: Skin color, texture, turgor normal. No rashes or lesions Lymph nodes: Cervical, supraclavicular, and axillary nodes normal.  Assessment & Plan:   Problem List Items Addressed This Visit    Iatrogenic hypothyroidism    Secondary to total thyroidectomy  for thyroid cancer, which occurred over 5 years ago,   Thyroid function is WNL on current dose.  No current changes needed.   Lab Results  Component Value Date   TSH 3.25 12/26/2015           Medicare annual wellness visit, subsequent    Annual Medicare wellness  exam was done as well as  management of acute and chronic conditions .  During the course of the visit the patient was educated and counseled about appropriate screening and preventive services including : fall prevention , diabetes screening, nutrition counseling, colorectal cancer screening, and recommended immunizations.  Printed recommendations for health maintenance screenings was given.       Multiple actinic keratoses    Treated in the past by Dr Phillip Heal in [facility], who is no longer a covered provider in her insurance plan. Referral pending        Sjogren's syndrome ([place])    With dry eye, dry mouth,  She has been given Restasi, unused bottles, by a close friend, which she wants permission to try ,  It wa stoo $$$ to start last year when advised by her ophthalmologist.       Adenomatous polyp    reviewed prior colonoscopy report.  Next scope would due at age 36 in 2019.  Will table discussion until then.       Advanced directives, counseling/discussion    15  minutes was spent with patient reviewing her goals of care .  Advance directives, Living will, and MOST forms  Were reviewed.      Osteopenia    Now taking  Femara since 2017 after lumpectomy for Stage IIb left breast ca.  Increasing calcium to 1500 mg daily.  t score  -1.5 in March 2017 left femoral neck        TMJ syndrome    Left jaw.  Trial of tizanidine prn.  Aggravated by gum chewing       Other Visit Diagnoses   None.     I am having Ms. Leino start on tizanidine. I am also having her maintain her cholecalciferol, Calcium Carbonate-Vitamin D, Multiple Vitamins-Minerals (OSTEO COMPLEX PO), vitamin B-12, aspirin, polyethylene glycol, triamcinolone cream, metoprolol succinate, omeprazole, losartan, levothyroxine, acetaminophen, furosemide, and letrozole.  Meds ordered this encounter  Medications  . tizanidine (ZANAFLEX) 2 MG capsule    Sig: Take 1 capsule (2 mg total) by mouth 3 (three) times daily. As needed for jaw pain    Dispense:  90 capsule    Refill:  2    There are no discontinued medications.  Follow-up: No Follow-up on file.   [name], MD

## 2016-03-30 NOTE — Patient Instructions (Addendum)
Dr Bary Castilla will do your annual  breast exam and mammogram in May 2018  Call his office in April to remind him to order the mammogram  Dr Mike Gip is doing labs at your September visit    I agree that you need at least 1200  To 1500 mg  Of calcium daily.    2,000 Iu's of Vit D3 daily is perfectly adequate    Try the "Beet Me"  Drink at [employer] bar for energy  Menopause is a normal process in which your reproductive ability comes to an end. This process happens gradually over a span of months to years, usually between the ages of 79 and 17. Menopause is complete when you have missed 12 consecutive menstrual periods. It is important to talk with your health care provider about some of the most common conditions that affect postmenopausal women, such as heart disease, cancer, and bone loss (osteoporosis). Adopting a healthy lifestyle and getting preventive care can help to promote your health and wellness. Those actions can also lower your chances of developing some of these common conditions. WHAT SHOULD I KNOW ABOUT MENOPAUSE? During menopause, you may experience a number of symptoms, such as:  Moderate-to-severe hot flashes.  Night sweats.  Decrease in sex drive.  Mood swings.  Headaches.  Tiredness.  Irritability.  Memory problems.  Insomnia. Choosing to treat or not to treat menopausal changes is an individual decision that you make with your health care provider. WHAT SHOULD I KNOW ABOUT HORMONE REPLACEMENT THERAPY AND SUPPLEMENTS? Hormone therapy products are effective for treating symptoms that are associated with menopause, such as hot flashes and night sweats. Hormone replacement carries certain risks, especially as you become older. If you are thinking about using estrogen or estrogen with progestin treatments, discuss the benefits and risks with your health care provider. WHAT SHOULD I KNOW ABOUT HEART DISEASE AND STROKE? Heart disease, heart attack, and stroke  become more likely as you age. This may be due, in part, to the hormonal changes that your body experiences during menopause. These can affect how your body processes dietary fats, triglycerides, and cholesterol. Heart attack and stroke are both medical emergencies. There are many things that you can do to help prevent heart disease and stroke:  Have your blood pressure checked at least every 1-2 years. High blood pressure causes heart disease and increases the risk of stroke.  If you are 79-76 years old, ask your health care provider if you should take aspirin to prevent a heart attack or a stroke.  Do not use any tobacco products, including cigarettes, chewing tobacco, or electronic cigarettes. If you need help quitting, ask your health care provider.  It is important to eat a healthy diet and maintain a healthy weight.  Be sure to include plenty of vegetables, fruits, low-fat dairy products, and lean protein.  Avoid eating foods that are high in solid fats, added sugars, or salt (sodium).  Get regular exercise. This is one of the most important things that you can do for your health.  Try to exercise for at least 150 minutes each week. The type of exercise that you do should increase your heart rate and make you sweat. This is known as moderate-intensity exercise.  Try to do strengthening exercises at least twice each week. Do these in addition to the moderate-intensity exercise.  Know your numbers.Ask your health care provider to check your cholesterol and your blood glucose. Continue to have your blood tested as directed by your  health care provider. WHAT SHOULD I KNOW ABOUT CANCER SCREENING? There are several types of cancer. Take the following steps to reduce your risk and to catch any cancer development as early as possible. Breast Cancer  Practice breast self-awareness.  This means understanding how your breasts normally appear and feel.  It also means doing regular breast  self-exams. Let your health care provider know about any changes, no matter how small.  If you are 79 or older, have a clinician do a breast exam (clinical breast exam or CBE) every year. Depending on your age, family history, and medical history, it may be recommended that you also have a yearly breast X-ray (mammogram).  If you have a family history of breast cancer, talk with your health care provider about genetic screening.  If you are at high risk for breast cancer, talk with your health care provider about having an MRI and a mammogram every year.  Breast cancer (BRCA) gene test is recommended for women who have family members with BRCA-related cancers. Results of the assessment will determine the need for genetic counseling and BRCA1 and for BRCA2 testing. BRCA-related cancers include these types:  Breast. This occurs in males or females.  Ovarian.  Tubal. This may also be called fallopian tube cancer.  Cancer of the abdominal or pelvic lining (peritoneal cancer).  Prostate.  Pancreatic. Cervical, Uterine, and Ovarian Cancer Your health care provider may recommend that you be screened regularly for cancer of the pelvic organs. These include your ovaries, uterus, and vagina. This screening involves a pelvic exam, which includes checking for microscopic changes to the surface of your cervix (Pap test).  For women ages 79-65, health care providers may recommend a pelvic exam and a Pap test every three years. For women ages 79-65, they may recommend the Pap test and pelvic exam, combined with testing for human papilloma virus (HPV), every five years. Some types of HPV increase your risk of cervical cancer. Testing for HPV may also be done on women of any age who have unclear Pap test results.  Other health care providers may not recommend any screening for nonpregnant women who are considered low risk for pelvic cancer and have no symptoms. Ask your health care provider if a screening  pelvic exam is right for you.  If you have had past treatment for cervical cancer or a condition that could lead to cancer, you need Pap tests and screening for cancer for at least 20 years after your treatment. If Pap tests have been discontinued for you, your risk factors (such as having a new sexual partner) need to be reassessed to determine if you should start having screenings again. Some women have medical problems that increase the chance of getting cervical cancer. In these cases, your health care provider may recommend that you have screening and Pap tests more often.  If you have a family history of uterine cancer or ovarian cancer, talk with your health care provider about genetic screening.  If you have vaginal bleeding after reaching menopause, tell your health care provider.  There are currently no reliable tests available to screen for ovarian cancer. Lung Cancer Lung cancer screening is recommended for adults 36-35 years old who are at high risk for lung cancer because of a history of smoking. A yearly low-dose CT scan of the lungs is recommended if you:  Currently smoke.  Have a history of at least 30 pack-years of smoking and you currently smoke or have quit within the  past 15 years. A pack-year is smoking an average of one pack of cigarettes per day for one year. Yearly screening should:  Continue until it has been 15 years since you quit.  Stop if you develop a health problem that would prevent you from having lung cancer treatment. Colorectal Cancer  This type of cancer can be detected and can often be prevented.  Routine colorectal cancer screening usually begins at age 57 and continues through age 33.  If you have risk factors for colon cancer, your health care provider may recommend that you be screened at an earlier age.  If you have a family history of colorectal cancer, talk with your health care provider about genetic screening.  Your health care provider  may also recommend using home test kits to check for hidden blood in your stool.  A small camera at the end of a tube can be used to examine your colon directly (sigmoidoscopy or colonoscopy). This is done to check for the earliest forms of colorectal cancer.  Direct examination of the colon should be repeated every 5-10 years until age 61. However, if early forms of precancerous polyps or small growths are found or if you have a family history or genetic risk for colorectal cancer, you may need to be screened more often. Skin Cancer  Check your skin from head to toe regularly.  Monitor any moles. Be sure to tell your health care provider:  About any new moles or changes in moles, especially if there is a change in a mole's shape or color.  If you have a mole that is larger than the size of a pencil eraser.  If any of your family members has a history of skin cancer, especially at a young age, talk with your health care provider about genetic screening.  Always use sunscreen. Apply sunscreen liberally and repeatedly throughout the day.  Whenever you are outside, protect yourself by wearing long sleeves, pants, a wide-brimmed hat, and sunglasses. WHAT SHOULD I KNOW ABOUT OSTEOPOROSIS? Osteoporosis is a condition in which bone destruction happens more quickly than new bone creation. After menopause, you may be at an increased risk for osteoporosis. To help prevent osteoporosis or the bone fractures that can happen because of osteoporosis, the following is recommended:  If you are 75-69 years old, get at least 1,000 mg of calcium and at least 600 mg of vitamin D per day.  If you are older than age 5 but younger than age 69, get at least 1,200 mg of calcium and at least 600 mg of vitamin D per day.  If you are older than age 37, get at least 1,200 mg of calcium and at least 800 mg of vitamin D per day. Smoking and excessive alcohol intake increase the risk of osteoporosis. Eat foods that are  rich in calcium and vitamin D, and do weight-bearing exercises several times each week as directed by your health care provider. WHAT SHOULD I KNOW ABOUT HOW MENOPAUSE AFFECTS [place]? Depression may occur at any age, but it is more common as you become older. Common symptoms of depression include:  Low or sad mood.  Changes in sleep patterns.  Changes in appetite or eating patterns.  Feeling an overall lack of motivation or enjoyment of activities that you previously enjoyed.  Frequent crying spells. Talk with your health care provider if you think that you are experiencing depression. WHAT SHOULD I KNOW ABOUT IMMUNIZATIONS? It is important that you get and maintain  your immunizations. These include:  Tetanus, diphtheria, and pertussis (Tdap) booster vaccine.  Influenza every year before the flu season begins.  Pneumonia vaccine.  Shingles vaccine. Your health care provider may also recommend other immunizations.   This information is not intended to replace advice given to you by your health care provider. Make sure you discuss any questions you have with your health care provider.   Document Released: 10/16/2005 Document Revised: 09/14/2014 Document Reviewed: 04/26/2014 Elsevier Interactive Patient Education [employer].

## 2016-03-31 DIAGNOSIS — M26629 Arthralgia of temporomandibular joint, unspecified side: Secondary | ICD-10-CM | POA: Insufficient documentation

## 2016-03-31 NOTE — Assessment & Plan Note (Signed)
Left jaw.  Trial of tizanidine prn.  Aggravated by gum chewing

## 2016-03-31 NOTE — Assessment & Plan Note (Signed)
Secondary to total thyroidectomy  for thyroid cancer, which occurred over 5 years ago,   Thyroid function is WNL on current dose.  No current changes needed.   Lab Results  Component Value Date   TSH 3.25 12/26/2015

## 2016-03-31 NOTE — Assessment & Plan Note (Signed)
Annual Medicare wellness  exam was done as well as  management of acute and chronic conditions .  During the course of the visit the patient was educated and counseled about appropriate screening and preventive services including : fall prevention , diabetes screening, nutrition counseling, colorectal cancer screening, and recommended immunizations.  Printed recommendations for health maintenance screenings was given.

## 2016-03-31 NOTE — Assessment & Plan Note (Signed)
15  minutes was spent with patient reviewing her goals of care .  Advance directives, Living will, and MOST forms  Were reviewed.

## 2016-03-31 NOTE — Assessment & Plan Note (Addendum)
Now taking Femara since 2017 after lumpectomy for Stage IIb left breast ca.  Increasing calcium to 1500 mg daily.  t score  -1.5 in March 2017 left femoral neck

## 2016-03-31 NOTE — Assessment & Plan Note (Signed)
reviewed prior colonoscopy report.  Next scope would due at age 81 in 2019.  Will table discussion until then.

## 2016-03-31 NOTE — Assessment & Plan Note (Signed)
Treated in the past by Dr Phillip Heal in [place], who is no longer a covered provider in her insurance plan. Referral pending

## 2016-03-31 NOTE — Assessment & Plan Note (Signed)
With dry eye, dry mouth,  She has been given Restasi, unused bottles, by a close friend, which she wants permission to try ,  It wa stoo $$$ to start last year when advised by her ophthalmologist.

## 2016-04-06 ENCOUNTER — Other Ambulatory Visit: Payer: Self-pay | Admitting: *Deleted

## 2016-04-06 MED ORDER — TIZANIDINE HCL 2 MG PO TABS: 2.0000 mg | ORAL_TABLET | Freq: Four times a day (QID) | ORAL | 0 refills | Status: DC | PRN

## 2016-04-08 ENCOUNTER — Telehealth: Payer: Self-pay | Admitting: Internal Medicine

## 2016-04-08 DIAGNOSIS — E785 Hyperlipidemia, unspecified: Secondary | ICD-10-CM

## 2016-04-08 DIAGNOSIS — E032 Hypothyroidism due to medicaments and other exogenous substances: Secondary | ICD-10-CM

## 2016-04-08 DIAGNOSIS — Z79899 Other long term (current) drug therapy: Secondary | ICD-10-CM

## 2016-04-08 NOTE — Telephone Encounter (Signed)
Pt called to inform that she is using Restasis so it can be added to medication list. Pt started this week.

## 2016-04-08 NOTE — Telephone Encounter (Signed)
Last labs were in April, please advise if needed. thanks

## 2016-04-08 NOTE — Telephone Encounter (Signed)
Also pt wanted to know when should she return and when to get labs done?  Call pt @ [phone]. Thank you!

## 2016-04-09 NOTE — Telephone Encounter (Signed)
She was told in my email in April to repeat labs in October.

## 2016-04-09 NOTE — Telephone Encounter (Signed)
Pt called and scheduled her labs.

## 2016-04-09 NOTE — Telephone Encounter (Signed)
Please advise patient October per the email PCP sent her, thanks

## 2016-04-09 NOTE — Telephone Encounter (Signed)
Ok. I called pt and left a vm to call office.

## 2016-04-29 ENCOUNTER — Encounter: Payer: Self-pay | Admitting: Family Medicine

## 2016-04-29 ENCOUNTER — Ambulatory Visit (INDEPENDENT_AMBULATORY_CARE_PROVIDER_SITE_OTHER): Payer: PPO | Admitting: Family Medicine

## 2016-04-29 VITALS — BP 157/89 | HR 84 | Temp 97.8°F | Wt 139.2 lb

## 2016-04-29 DIAGNOSIS — N390 Urinary tract infection, site not specified: Secondary | ICD-10-CM

## 2016-04-29 DIAGNOSIS — R300 Dysuria: Secondary | ICD-10-CM

## 2016-04-29 LAB — POCT URINALYSIS DIPSTICK
Bilirubin, UA: NEGATIVE
Glucose, UA: NEGATIVE
KETONES UA: NEGATIVE
Nitrite, UA: NEGATIVE
Spec Grav, UA: 1.01
UROBILINOGEN UA: 0.2
pH, UA: 6

## 2016-04-29 MED ORDER — CEPHALEXIN 500 MG PO CAPS: 500.0000 mg | ORAL_CAPSULE | Freq: Two times a day (BID) | ORAL | 0 refills | Status: DC

## 2016-04-29 NOTE — Patient Instructions (Signed)
Take the mediation twice daily as prescribed.  Follow up as needed.  Take care  Dr. Lacinda Axon

## 2016-04-29 NOTE — Progress Notes (Signed)
   Subjective:  Patient ID: Kim Santiago, female    DOB: Jan 20, 1935  Age: 81 y.o. MRN: [id number]  CC: ? UTI  HPI:  81 year old female presents with concerns for UTI.  Patient states that her symptoms started on Sunday. At that time she developed urinary frequency, urgency, and suprapubic pressure. No reports of dysuria. She states that this is characteristic of her prior UTIs. No known exacerbating or relieving factors. No medications tried. No associated fevers or chills. No flank pain. No other complaints.  Social Hx   Social History   Social History  . Marital status: Widowed    Spouse name: N/A  . Number of children: 1  . Years of education: college   Social History Main Topics  . Smoking status: Never Smoker  . Smokeless tobacco: Never Used  . Alcohol use No  . Drug use: No  . Sexual activity: No   Other Topics Concern  . None   Social History Narrative   Has 1 adopted daughter.   Review of Systems  Constitutional: Negative.   Gastrointestinal:       Suprapubic pressure.  Genitourinary: Positive for frequency and urgency.   Objective:  BP (!) 157/89 (BP Location: Right Arm, Patient Position: Sitting, Cuff Size: Normal)   Pulse 84   Temp 97.8 F (36.6 C) (Oral)   Wt 139 lb 4 oz (63.2 kg)   SpO2 99%   BMI 24.67 kg/m   BP/Weight 04/29/2016 [id number]  Systolic BP [id number]  Diastolic BP 89 74 72  Wt. (Lbs) 139.25 138 139  BMI 24.67 24.45 23.85   Physical Exam  Constitutional: She is oriented to person, place, and time. She appears well-developed. No distress.  Cardiovascular: Normal rate and regular rhythm.   Pulmonary/Chest: Effort normal. She has no wheezes. She has no rales.  Abdominal: Soft. She exhibits no distension.  Mild suprapubic tenderness. No rebound or guarding.  Neurological: She is alert and oriented to person, place, and time.  Psychiatric: She has a normal mood and affect.  Vitals reviewed.  Lab Results  Component Value  Date   WBC 3.9 (L) 12/26/2015   HGB 12.1 12/26/2015   HCT 35.7 (L) 12/26/2015   PLT 206.0 12/26/2015   GLUCOSE 97 12/26/2015   CHOL 160 12/26/2015   TRIG 88.0 12/26/2015   HDL 45.20 12/26/2015   LDLDIRECT 117.6 12/27/2012   LDLCALC 97 12/26/2015   ALT 7 (L) 02/12/2016   AST 17 02/12/2016   NA 136 12/26/2015   K 5.1 12/26/2015   CL 100 12/26/2015   CREATININE 0.66 12/26/2015   BUN 16 12/26/2015   CO2 31 12/26/2015   TSH 3.25 12/26/2015   Assessment & Plan:   Problem List Items Addressed This Visit    Recurrent UTI - Primary    New acute UTI. Treating with Keflex.      Relevant Medications   cephALEXin (KEFLEX) 500 MG capsule   Other Relevant Orders   POCT Urinalysis Dipstick (Completed)   Urine Culture    Other Visit Diagnoses   None.     Meds ordered this encounter  Medications  . cephALEXin (KEFLEX) 500 MG capsule    Sig: Take 1 capsule (500 mg total) by mouth 2 (two) times daily.    Dispense:  10 capsule    Refill:  0   Follow-up: PRN  [place]

## 2016-04-29 NOTE — Assessment & Plan Note (Signed)
New acute UTI. Treating with Keflex.

## 2016-05-01 LAB — URINE CULTURE

## 2016-05-10 ENCOUNTER — Encounter: Payer: Self-pay | Admitting: Gynecology

## 2016-05-10 ENCOUNTER — Ambulatory Visit
Admission: EM | Admit: 2016-05-10 | Discharge: 2016-05-10 | Disposition: A | Discharge status: Home / Self Care | Payer: PPO | Attending: Family Medicine | Admitting: Family Medicine

## 2016-05-10 DIAGNOSIS — N390 Urinary tract infection, site not specified: Secondary | ICD-10-CM

## 2016-05-10 LAB — URINALYSIS COMPLETE WITH MICROSCOPIC (ARMC ONLY)
BILIRUBIN URINE: NEGATIVE
Glucose, UA: NEGATIVE mg/dL
KETONES UR: NEGATIVE mg/dL
NITRITE: NEGATIVE
PH: 6 (ref 5.0–8.0)
PROTEIN: 30 mg/dL — AB
Specific Gravity, Urine: <1.005 — ABNORMAL LOW (ref 1.005–1.030)
Squamous Epithelial / LPF: NONE SEEN

## 2016-05-10 MED ORDER — CIPROFLOXACIN HCL 500 MG PO TABS: 500.0000 mg | ORAL_TABLET | Freq: Two times a day (BID) | ORAL | 0 refills | Status: AC

## 2016-05-10 NOTE — Discharge Instructions (Signed)
Take medication as prescribed. Rest. Drink plenty of fluids.  ° °Follow up with your primary care physician this week as needed. Return to Urgent care for new or worsening concerns.  ° °

## 2016-05-10 NOTE — ED Triage Notes (Signed)
Per patient recurrent UTI. Per patient had UTI over a week ago and was given Keflex.

## 2016-05-10 NOTE — ED Provider Notes (Signed)
[facility] ____________________________________________  Time seen: Approximately 10:08 AM  I have reviewed the triage vital signs and the nursing notes.   HISTORY   Chief Complaint Recurrent UTI  HPI Kim Santiago is a 81 y.o. female  presents for the complaints of 2 days of urinary frequency, urinary urgency, urinary burning and suprapubic pressure. Patient reports she frequently had to get up last night to urinate. Patient reports history urinary tract infections with similar symptoms. Patient reports that she was seen by her primary care physician's office on 04/29/2016 for same complaints and states that she was treated with oral cephalexin for urinary tract infection. Patient reports she took the antibiotic for 5 days and states that symptoms were better. Patient reports she is now concerned that urinary tract infection did not fully resolved.  Denies fevers. Denies abdominal pain, back pain, chest pain, shortness of breath, nausea, vomiting, diarrhea, constipation or other complaints. Reports continues to eat and drink well.  PCP: [name], MD   Past Medical History:  Diagnosis Date  . Arthritis   . Breast cancer in female [facility]) November 18, 2015   Left: 3.9 cm tumor, T2, 1/2 sentinel nodes positive for macro metastatic disease, N1, 3 negative nodes in the axillary tail., low Mammoprint score  . Cancer [facility])    thyroid takes levothyroxine  . Chronic kidney disease    UTI  . Genetic screening March 2017.   Mammoprint of left breast cancer: Low risk for recurrence.   . Hypertension   . hypothyroidism    secondary to thyroidectomy for thyroid ca  . Hypothyroidism   . Menopause 40s   natural, hot flashes and mood lability now gone, off prempro 7 months  . Myocardial infarction ([place]) 2013  . Osteoporosis    Osteopenia  . Rosacea   . Sjoegren syndrome ([place])   . Stress-induced cardiomyopathy September of 2013   EF 35%. Peak troponin was 1.8.     Patient Active Problem List   Diagnosis Date Noted  . Recurrent UTI 04/29/2016  . TMJ syndrome 03/31/2016  . Vertigo 03/12/2016  . Hearing loss 03/12/2016  . Osteopenia 12/05/2015  . Malignant neoplasm of left female breast ([place]) 11/26/2015  . Breast cancer, female, left 11/05/2015  . Advanced directives, counseling/discussion 09/18/2015  . Psoriasis and similar disorder 05/07/2015  . Contact dermatitis and eczema 05/07/2015  . History of heart attack 06/12/2014  . HLD (hyperlipidemia) 06/12/2014  . Hx of thyroid cancer 06/12/2014  . Adenomatous polyp 06/12/2014  . Depression, major, single episode, moderate ([place]) 05/29/2014  . Grief 04/08/2014  . Sjogren's syndrome ([place]) 03/09/2014  . Insomnia due to stress 03/06/2014  . Multiple actinic keratoses 09/22/2013  . Cervicalgia of occipito-atlanto-axial region 09/22/2013  . Hyperlipidemia 12/28/2012  . Medicare annual wellness visit, subsequent 12/28/2012  . Iatrogenic hypothyroidism 10/02/2011    Past Surgical History:  Procedure Laterality Date  . BACK SURGERY    . BREAST BIOPSY Left 10/30/15   positive, done in Dr. Dwyane Luo office  . CARDIAC CATHETERIZATION  05/2012   [facility]. No significant CAD. Ejection fraction of 35% due to stress-induced cardiomyopathy.  . CHOLECYSTECTOMY    . COLONOSCOPY    . DILATION AND CURETTAGE OF UTERUS    . KYPHOSIS SURGERY  Feb 2008   L1, Dr. Mauri Pole  . LUMBAR DISC SURGERY     L4-L5  . SENTINEL NODE BIOPSY Left 11/18/2015   Procedure: SENTINEL NODE BIOPSY;  Surgeon: Robert Bellow, MD;  Location: [facility] ORS;  Service: General;  Laterality: Left;  . SHOULDER ARTHROSCOPY  2004   Left, Dr. Jefm Bryant  . SIMPLE MASTECTOMY WITH AXILLARY SENTINEL NODE BIOPSY Left 11/18/2015   Procedure: SIMPLE MASTECTOMY;  Surgeon: Robert Bellow, MD;  Location: [facility] ORS;  Service: General;  Laterality: Left;  . SPINE SURGERY     L4-5 diskectomy  . THYROIDECTOMY     Thyroid Cancer  . TONSILLECTOMY    . TUBAL  LIGATION       No current facility-administered medications for this encounter.   Current Outpatient Prescriptions:  .  acetaminophen (TYLENOL) 650 MG CR tablet, Take 650 mg by mouth every 8 (eight) hours as needed for pain., Disp: , Rfl:  .  aspirin 81 MG tablet, Take 81 mg by mouth daily., Disp: , Rfl:  .  Calcium Carbonate-Vitamin D (CALCIUM + D) 600-200 MG-UNIT TABS, Take 2 tablets by mouth 2 (two) times daily. , Disp: , Rfl:  .  cholecalciferol (VITAMIN D) 1000 UNITS tablet, Take 2,000 Units by mouth daily. , Disp: , Rfl:  .  furosemide (LASIX) 20 MG tablet, Once daily as needed for fluid retention, Disp: 90 tablet, Rfl: 1 .  letrozole (FEMARA) 2.5 MG tablet, Take 1 tablet (2.5 mg total) by mouth daily., Disp: 90 tablet, Rfl: 1 .  levothyroxine (SYNTHROID, LEVOTHROID) 88 MCG tablet, Take 1 tablet (88 mcg total) by mouth daily., Disp: 90 tablet, Rfl: 4 .  losartan (COZAAR) 25 MG tablet, Take 1 tablet (25 mg total) by mouth daily., Disp: 90 tablet, Rfl: 4 .  metoprolol succinate (TOPROL XL) 25 MG 24 hr tablet, Take 1 tablet (25 mg total) by mouth daily., Disp: 90 tablet, Rfl: 4 .  Multiple Vitamins-Minerals (OSTEO COMPLEX PO), Take 1 tablet by mouth daily. Take one by mouth daily, Disp: , Rfl:  .  omeprazole (PRILOSEC) 20 MG capsule, Take 1 capsule (20 mg total) by mouth daily., Disp: 90 capsule, Rfl: 4 .  polyethylene glycol (MIRALAX / GLYCOLAX) packet, Take 17 g by mouth daily., Disp: , Rfl:  .  tiZANidine (ZANAFLEX) 2 MG tablet, Take 1 tablet (2 mg total) by mouth every 6 (six) hours as needed (for jaw pain)., Disp: 90 tablet, Rfl: 0 .  triamcinolone cream (KENALOG) 0.1 %, Apply 1 application topically 2 (two) times daily., Disp: 30 g, Rfl: 0 .  vitamin B-12 (CYANOCOBALAMIN) 1000 MCG tablet, Take 1,000 mcg by mouth daily., Disp: , Rfl:  .  cephALEXin (KEFLEX) 500 MG capsule, Take 1 capsule (500 mg total) by mouth 2 (two) times daily., Disp: 10 capsule, Rfl: 0 .  ciprofloxacin (CIPRO) 500  MG tablet, Take 1 tablet (500 mg total) by mouth 2 (two) times daily., Disp: 14 tablet, Rfl: 0  Allergies Amoxicillin; Codeine; Naprosyn [naproxen]; Orudis [ketoprofen]; and Sulfathiazole  Family History  Problem Relation Age of Onset  . COPD Father   . Cancer Father     esophageal  . Kidney disease Mother   . Heart disease Mother   . Kidney disease Sister   . Cancer Brother 35    colon cancer (both brothers)  . Heart attack Brother 75  . Heart disease Brother     Social History Social History  Substance Use Topics  . Smoking status: Never Smoker  . Smokeless tobacco: Never Used  . Alcohol use No    Review of Systems Constitutional: No fever/chills Eyes: No visual changes. ENT: No sore throat. Cardiovascular: Denies chest pain. Respiratory: Denies shortness of breath. Gastrointestinal: No abdominal pain.  No nausea, no vomiting.  No diarrhea.  No constipation. Genitourinary: Positive for dysuria. Musculoskeletal: Negative for back pain. Skin: Negative for rash. Neurological: Negative for headaches, focal weakness or numbness.  10-point ROS otherwise negative.  ____________________________________________   PHYSICAL EXAM:  VITAL SIGNS: ED Triage Vitals  Enc Vitals Group     BP 05/10/16 0923 (!) 152/66     Pulse Rate 05/10/16 0923 (!) 102     Resp 05/10/16 0923 16     Temp 05/10/16 0923 98.3 F (36.8 C)     Temp Source 05/10/16 0923 Oral     SpO2 05/10/16 0923 100 %     Weight 05/10/16 0925 137 lb (62.1 kg)     Height 05/10/16 0925 5\' 3"  (1.6 m)     Head Circumference --      Peak Flow --      Pain Score 05/10/16 1002 3     Pain Loc --      Pain Edu? --      Excl. in [place]? --     Constitutional: Alert and oriented. Well appearing and in no acute distress. Eyes: Conjunctivae are normal. PERRL. EOMI. ENT      Head: Normocephalic and atraumatic.      Mouth/Throat: Mucous membranes are moist.Oropharynx non-erythematous. Cardiovascular: Normal rate,  regular rhythm. Grossly normal heart sounds.  Good peripheral circulation. Respiratory: Normal respiratory effort without tachypnea nor retractions. Breath sounds are clear and equal bilaterally. No wheezes/rales/rhonchi.. Gastrointestinal: Soft and nontender. No distention. Normal Bowel sounds. No CVA tenderness. Musculoskeletal:  Nontender with normal range of motion in all extremities. No midline cervical, thoracic or lumbar tenderness to palpation.  Neurologic:  Normal speech and language. No gross focal neurologic deficits are appreciated. Speech is normal. No gait instability.  Skin:  Skin is warm, dry and intact. No rash noted. Psychiatric: Mood and affect are normal. Speech and behavior are normal. Patient exhibits appropriate insight and judgment   ___________________________________________   LABS (all labs ordered are listed, but only abnormal results are displayed)  Labs Reviewed  URINALYSIS COMPLETEWITH MICROSCOPIC ([facility] ONLY) - Abnormal; Notable for the following:       Result Value   Color, Urine STRAW (*)    APPearance CLOUDY (*)    Specific Gravity, Urine <1.005 (*)    Hgb urine dipstick MODERATE (*)    Protein, ur 30 (*)    Leukocytes, UA MODERATE (*)    Bacteria, UA RARE (*)    All other components within normal limits  URINE CULTURE     PROCEDURES Procedures  _______________________________________   INITIAL IMPRESSION / ASSESSMENT AND PLAN / ED COURSE  Pertinent labs & imaging results that were available during my care of the patient were reviewed by me and considered in my medical decision making (see chart for details).  Well-appearing patient. No acute distress. Presents for the complaints of dysuria 2 days. Urinalysis reviewed. Suspect urinary tract infection. Will treat patient with oral Cipro 7 days. Will also culture urine. Encourage close PCP follow-up and discuss possible follow-up with urology. Discussed indication, risks and benefits of  medications with patient.  Discussed follow up with Primary care physician this week. Discussed follow up and return parameters including no resolution or any worsening concerns. Patient verbalized understanding and agreed to plan.   ____________________________________________   FINAL CLINICAL IMPRESSION(S) / ED DIAGNOSES  Final diagnoses:  UTI (lower urinary tract infection)     Discharge Medication List as of 05/10/2016  9:58 AM  START taking these medications   Details  ciprofloxacin (CIPRO) 500 MG tablet Take 1 tablet (500 mg total) by mouth 2 (two) times daily., Starting Sun 05/10/2016, Until Sun 05/17/2016, Print        Note: This dictation was prepared with Dragon dictation along with smaller phrase technology. Any transcriptional errors that result from this process are unintentional.    Clinical Course      Marylene Land, NP 05/10/16 1128

## 2016-05-12 ENCOUNTER — Telehealth: Payer: Self-pay | Admitting: *Deleted

## 2016-05-12 LAB — URINE CULTURE: Culture: <10000 — AB

## 2016-05-12 NOTE — Telephone Encounter (Signed)
lvm to call office to schedule appt.

## 2016-05-12 NOTE — Telephone Encounter (Signed)
her last 2 "UTIs" were not infections  Per review of cultures from Sept and march.  Make her an appt with me to have a pelvic exam

## 2016-05-12 NOTE — Telephone Encounter (Signed)
Please advise for referral? Thanks

## 2016-05-12 NOTE — Telephone Encounter (Signed)
Patient was seen at urgent care over the weekend. She was diagnosed with another Uti. She questioned if she should be seen by an urologist,due to having constant Urinary track infections.  Pt contact [phone]

## 2016-05-12 NOTE — Telephone Encounter (Signed)
Please schedule an appt with Dr. Derrel Nip for evaluation first, then they can discuss a referral.

## 2016-05-13 ENCOUNTER — Inpatient Hospital Stay: Payer: PPO | Attending: Hematology and Oncology

## 2016-05-13 ENCOUNTER — Inpatient Hospital Stay (HOSPITAL_BASED_OUTPATIENT_CLINIC_OR_DEPARTMENT_OTHER): Payer: PPO | Admitting: Hematology and Oncology

## 2016-05-13 ENCOUNTER — Other Ambulatory Visit: Payer: Self-pay | Admitting: *Deleted

## 2016-05-13 VITALS — BP 127/77 | HR 73 | Temp 97.7°F | Wt 139.3 lb

## 2016-05-13 DIAGNOSIS — Z87440 Personal history of urinary (tract) infections: Secondary | ICD-10-CM | POA: Insufficient documentation

## 2016-05-13 DIAGNOSIS — Z800 Family history of malignant neoplasm of digestive organs: Secondary | ICD-10-CM | POA: Diagnosis not present

## 2016-05-13 DIAGNOSIS — M1990 Unspecified osteoarthritis, unspecified site: Secondary | ICD-10-CM | POA: Diagnosis not present

## 2016-05-13 DIAGNOSIS — Z880 Allergy status to penicillin: Secondary | ICD-10-CM | POA: Insufficient documentation

## 2016-05-13 DIAGNOSIS — D649 Anemia, unspecified: Secondary | ICD-10-CM

## 2016-05-13 DIAGNOSIS — C50912 Malignant neoplasm of unspecified site of left female breast: Secondary | ICD-10-CM | POA: Insufficient documentation

## 2016-05-13 DIAGNOSIS — Z170 Estrogen receptor positive status [ER+]: Secondary | ICD-10-CM

## 2016-05-13 DIAGNOSIS — Z79811 Long term (current) use of aromatase inhibitors: Secondary | ICD-10-CM

## 2016-05-13 DIAGNOSIS — Z79899 Other long term (current) drug therapy: Secondary | ICD-10-CM

## 2016-05-13 DIAGNOSIS — N189 Chronic kidney disease, unspecified: Secondary | ICD-10-CM | POA: Insufficient documentation

## 2016-05-13 DIAGNOSIS — R232 Flushing: Secondary | ICD-10-CM

## 2016-05-13 DIAGNOSIS — R5383 Other fatigue: Secondary | ICD-10-CM | POA: Insufficient documentation

## 2016-05-13 DIAGNOSIS — I129 Hypertensive chronic kidney disease with stage 1 through stage 4 chronic kidney disease, or unspecified chronic kidney disease: Secondary | ICD-10-CM | POA: Diagnosis not present

## 2016-05-13 DIAGNOSIS — I252 Old myocardial infarction: Secondary | ICD-10-CM | POA: Diagnosis not present

## 2016-05-13 DIAGNOSIS — Z7982 Long term (current) use of aspirin: Secondary | ICD-10-CM | POA: Insufficient documentation

## 2016-05-13 DIAGNOSIS — Z9012 Acquired absence of left breast and nipple: Secondary | ICD-10-CM

## 2016-05-13 DIAGNOSIS — Z85850 Personal history of malignant neoplasm of thyroid: Secondary | ICD-10-CM | POA: Insufficient documentation

## 2016-05-13 DIAGNOSIS — E039 Hypothyroidism, unspecified: Secondary | ICD-10-CM | POA: Diagnosis not present

## 2016-05-13 LAB — COMPREHENSIVE METABOLIC PANEL
ALT: 8 U/L — ABNORMAL LOW (ref 14–54)
AST: 19 U/L (ref 15–41)
Albumin: 4.2 g/dL (ref 3.5–5.0)
Alkaline Phosphatase: 83 U/L (ref 38–126)
Anion gap: 5 (ref 5–15)
BUN: 11 mg/dL (ref 6–20)
CO2: 31 mmol/L (ref 22–32)
Calcium: 8.9 mg/dL (ref 8.9–10.3)
Chloride: 100 mmol/L — ABNORMAL LOW (ref 101–111)
Creatinine, Ser: 0.78 mg/dL (ref 0.44–1.00)
GFR calc Af Amer: >60 mL/min (ref >60)
GFR calc non Af Amer: >60 mL/min (ref >60)
Glucose, Bld: 96 mg/dL (ref 65–99)
Potassium: 4.2 mmol/L (ref 3.5–5.1)
Sodium: 136 mmol/L (ref 135–145)
Total Bilirubin: 0.6 mg/dL (ref 0.3–1.2)
Total Protein: 7.5 g/dL (ref 6.5–8.1)

## 2016-05-13 LAB — CBC WITH DIFFERENTIAL/PLATELET
Basophils Absolute: 0.1 10*3/uL (ref 0–0.1)
Basophils Relative: 1 %
Eosinophils Absolute: 0.2 10*3/uL (ref 0–0.7)
Eosinophils Relative: 4 %
HCT: 34 % — ABNORMAL LOW (ref 35.0–47.0)
Hemoglobin: 11.7 g/dL — ABNORMAL LOW (ref 12.0–16.0)
Lymphocytes Relative: 25 %
Lymphs Abs: 1 10*3/uL (ref 1.0–3.6)
MCH: 31.6 pg (ref 26.0–34.0)
MCHC: 34.4 g/dL (ref 32.0–36.0)
MCV: 91.8 fL (ref 80.0–100.0)
Monocytes Absolute: 0.4 10*3/uL (ref 0.2–0.9)
Monocytes Relative: 11 %
Neutro Abs: 2.3 10*3/uL (ref 1.4–6.5)
Neutrophils Relative %: 59 %
Platelets: 162 10*3/uL (ref 150–440)
RBC: 3.71 MIL/uL — ABNORMAL LOW (ref 3.80–5.20)
RDW: 14.4 % (ref 11.5–14.5)
WBC: 3.9 10*3/uL (ref 3.6–11.0)

## 2016-05-13 LAB — IRON AND TIBC
Iron: 53 ug/dL (ref 28–170)
Saturation Ratios: 19 % (ref 10.4–31.8)
TIBC: 277 ug/dL (ref 250–450)
UIBC: 224 ug/dL

## 2016-05-13 LAB — FERRITIN: Ferritin: 297 ng/mL (ref 11–307)

## 2016-05-13 LAB — VITAMIN B12: Vitamin B-12: 2267 pg/mL — ABNORMAL HIGH (ref 180–914)

## 2016-05-13 LAB — FOLATE: Folate: 26 ng/mL (ref >5.9)

## 2016-05-13 NOTE — Progress Notes (Signed)
[facility] day:  05/13/2016  Chief Complaint: Kim Santiago is a 81 y.o. female with stage IIB left breast cancer who is seen for 3 month assessment on Femara.  HPI:  The patient was last seen in the medical oncology clinic on 02/12/2016.  At that time, she was taking Femara.  She had decided against radiation.  She noted a few hot flashes.  During the interim, she is felt "okay". She notes fatigue. She is losing her hair. She has a little discomfort in her joints. She describes her fingers, feet and toes locking up. She has had 2 urinary tract infections in the past 3 weeks.  She is being referred to urology.   Past Medical History:  Diagnosis Date  . Arthritis   . Breast cancer in female [facility]) November 18, 2015   Left: 3.9 cm tumor, T2, 1/2 sentinel nodes positive for macro metastatic disease, N1, 3 negative nodes in the axillary tail., low Mammoprint score  . Cancer [facility])    thyroid takes levothyroxine  . Chronic kidney disease    UTI  . Genetic screening March 2017.   Mammoprint of left breast cancer: Low risk for recurrence.   . Hypertension   . hypothyroidism    secondary to thyroidectomy for thyroid ca  . Hypothyroidism   . Menopause 40s   natural, hot flashes and mood lability now gone, off prempro 7 months  . Myocardial infarction ([place]) 2013  . Osteoporosis    Osteopenia  . Rosacea   . Sjoegren syndrome ([place])   . Stress-induced cardiomyopathy September of 2013   EF 35%. Peak troponin was 1.8.    Past Surgical History:  Procedure Laterality Date  . BACK SURGERY    . BREAST BIOPSY Left 10/30/15   positive, done in Dr. Dwyane Luo office  . CARDIAC CATHETERIZATION  05/2012   [facility]. No significant CAD. Ejection fraction of 35% due to stress-induced cardiomyopathy.  . CHOLECYSTECTOMY    . COLONOSCOPY    . DILATION AND CURETTAGE OF UTERUS    . KYPHOSIS SURGERY  Feb 2008   L1, Dr. Mauri Pole  . LUMBAR DISC SURGERY     L4-L5  .  SENTINEL NODE BIOPSY Left 11/18/2015   Procedure: SENTINEL NODE BIOPSY;  Surgeon: Robert Bellow, MD;  Location: [facility] ORS;  Service: General;  Laterality: Left;  . SHOULDER ARTHROSCOPY  2004   Left, Dr. Jefm Bryant  . SIMPLE MASTECTOMY WITH AXILLARY SENTINEL NODE BIOPSY Left 11/18/2015   Procedure: SIMPLE MASTECTOMY;  Surgeon: Robert Bellow, MD;  Location: [facility] ORS;  Service: General;  Laterality: Left;  . SPINE SURGERY     L4-5 diskectomy  . THYROIDECTOMY     Thyroid Cancer  . TONSILLECTOMY    . TUBAL LIGATION      Family History  Problem Relation Age of Onset  . COPD Father   . Cancer Father     esophageal  . Kidney disease Mother   . Heart disease Mother   . Kidney disease Sister   . Cancer Brother 8    colon cancer (both brothers)  . Heart attack Brother 83  . Heart disease Brother     Social History:  reports that she has never smoked. She has never used smokeless tobacco. She reports that she does not drink alcohol or use drugs.  She is from [place], [place].  She was married for 24 years.  Her husband died 74 months ago.  She lives  alone in [place].  She has an adopted daughter and 2 grandchildren.  Her brother died of a MI following a cholecystectomy in 12/2015.  The patient is alone today.  Allergies:  Allergies  Allergen Reactions  . Amoxicillin Rash  . Codeine Nausea And Vomiting  . Naprosyn [Naproxen] Swelling  . Orudis [Ketoprofen] Hives  . Sulfathiazole Rash    Current Medications: Current Outpatient Prescriptions  Medication Sig Dispense Refill  . acetaminophen (TYLENOL) 650 MG CR tablet Take 650 mg by mouth every 8 (eight) hours as needed for pain.    Marland Kitchen aspirin 81 MG tablet Take 81 mg by mouth daily.    . Calcium Carbonate-Vitamin D (CALCIUM + D) 600-200 MG-UNIT TABS Take 2 tablets by mouth 2 (two) times daily.     . cholecalciferol (VITAMIN D) 1000 UNITS tablet Take 2,000 Units by mouth daily.     . furosemide (LASIX) 20 MG tablet Once daily as  needed for fluid retention 90 tablet 1  . letrozole (FEMARA) 2.5 MG tablet Take 1 tablet (2.5 mg total) by mouth daily. 90 tablet 1  . levothyroxine (SYNTHROID, LEVOTHROID) 88 MCG tablet Take 1 tablet (88 mcg total) by mouth daily. 90 tablet 4  . losartan (COZAAR) 25 MG tablet Take 1 tablet (25 mg total) by mouth daily. 90 tablet 4  . metoprolol succinate (TOPROL XL) 25 MG 24 hr tablet Take 1 tablet (25 mg total) by mouth daily. 90 tablet 4  . Multiple Vitamins-Minerals (OSTEO COMPLEX PO) Take 1 tablet by mouth daily. Take one by mouth daily    . omeprazole (PRILOSEC) 20 MG capsule Take 1 capsule (20 mg total) by mouth daily. 90 capsule 4  . polyethylene glycol (MIRALAX / GLYCOLAX) packet Take 17 g by mouth daily.    Marland Kitchen tiZANidine (ZANAFLEX) 2 MG tablet Take 1 tablet (2 mg total) by mouth every 6 (six) hours as needed (for jaw pain). 90 tablet 0  . triamcinolone cream (KENALOG) 0.1 % Apply 1 application topically 2 (two) times daily. 30 g 0  . vitamin B-12 (CYANOCOBALAMIN) 1000 MCG tablet Take 1,000 mcg by mouth daily.     No current facility-administered medications for this visit.     Review of Systems:  GENERAL:  Feels "ok".  Fatigue.  No fevers or sweats.  Weight up 2 pounds. PERFORMANCE STATUS (ECOG):  1 HEENT:  No visual changes, runny nose, sore throat, mouth sores or tenderness. Lungs: No shortness of breath or cough.  No hemoptysis. Cardiac:  No chest pain, palpitations, orthopnea, or PND. GI:  No nausea, vomiting, diarrhea, constipation, melena or hematochezia. GU:  Two UTIs in the past 3 weeks.  No urgency, frequency, dysuria, or hematuria. Musculoskeletal:  No back pain.  Little joint pain.  No muscle tenderness. Extremities:  No pain or swelling. Skin:  Hair thinning.  No rashes or skin changes. Neuro:  No headache, numbness or weakness, balance or coordination issues. Endocrine:  No diabetes.  Thyroid disease on Synthroid.  Few hot flashes.  No night sweats. Psych:   Feels  edgy.  No mood changes, anxiety or depression.  Pain:  No focal pain. Review of systems:  All other systems reviewed and found to be negative.  Physical Exam: Blood pressure 127/77, pulse 73, temperature 97.7 F (36.5 C), temperature source Tympanic, weight 139 lb 5.3 oz (63.2 kg). GENERAL:  Well developed, well nourished, woman sitting comfortably in the exam room in no acute distress. MENTAL STATUS:  Alert and oriented to person, place  and time. HEAD:  Short gray hair.  Normocephalic, atraumatic, face symmetric, no Cushingoid features. EYES:  Glasses.  Blue eyes.  Pupils equal round and reactive to light and accomodation. No conjunctivitis or scleral icterus. ENT: Oropharynx clear without lesion. Tongue normal. Mucous membranes moist.  RESPIRATORY: Clear to auscultation without rales, wheezes or rhonchi. CARDIOVASCULAR: Regular rate and rhythm without murmur, rub or gallop. No JVD. BREASTS:  Left mastectomy with no erythema or nodularity.  Right breast without masses, skin changes or nipple discharge. ABDOMEN: Soft, non-tender, with active bowel sounds, and no hepatosplenomegaly. SKIN: No rashes, ulcers or lesions. EXTREMITIES: No edema, no skin discoloration or tenderness. No palpable cords. LYMPH NODES:  No palpable cervical, supraclavicular, axillary or inguinal adenopathy  NEUROLOGICAL: Unremarkable. PSYCH: Appropriate.  Labs: Appointment on 05/13/2016  Component Date Value Ref Range Status  . WBC 05/13/2016 3.9  3.6 - 11.0 K/uL Final  . RBC 05/13/2016 3.71* 3.80 - 5.20 MIL/uL Final  . Hemoglobin 05/13/2016 11.7* 12.0 - 16.0 g/dL Final  . HCT 05/13/2016 34.0* 35.0 - 47.0 % Final  . MCV 05/13/2016 91.8  80.0 - 100.0 fL Final  . MCH 05/13/2016 31.6  26.0 - 34.0 pg Final  . MCHC 05/13/2016 34.4  32.0 - 36.0 g/dL Final  . RDW 05/13/2016 14.4  11.5 - 14.5 % Final  . Platelets 05/13/2016 162  150 - 440 K/uL Final  . Neutrophils Relative % 05/13/2016 59  % Final  . Neutro  Abs 05/13/2016 2.3  1.4 - 6.5 K/uL Final  . Lymphocytes Relative 05/13/2016 25  % Final  . Lymphs Abs 05/13/2016 1.0  1.0 - 3.6 K/uL Final  . Monocytes Relative 05/13/2016 11  % Final  . Monocytes Absolute 05/13/2016 0.4  0.2 - 0.9 K/uL Final  . Eosinophils Relative 05/13/2016 4  % Final  . Eosinophils Absolute 05/13/2016 0.2  0 - 0.7 K/uL Final  . Basophils Relative 05/13/2016 1  % Final  . Basophils Absolute 05/13/2016 0.1  0 - 0.1 K/uL Final  . CA 27.29 05/14/2016 32.5  0.0 - 38.6 U/mL Final   Comment: (NOTE) [employer] Cent[employer]/ACS methodology Performed At: [facility] [place], [place] [id number] Lindon Romp MD UX:[id number]   . Ferritin 05/13/2016 297  11 - 307 ng/mL Final  . Sodium 05/13/2016 136  135 - 145 mmol/L Final  . Potassium 05/13/2016 4.2  3.5 - 5.1 mmol/L Final  . Chloride 05/13/2016 100* 101 - 111 mmol/L Final  . CO2 05/13/2016 31  22 - 32 mmol/L Final  . Glucose, Bld 05/13/2016 96  65 - 99 mg/dL Final  . BUN 05/13/2016 11  6 - 20 mg/dL Final  . Creatinine, Ser 05/13/2016 0.78  0.44 - 1.00 mg/dL Final  . Calcium 05/13/2016 8.9  8.9 - 10.3 mg/dL Final  . Total Protein 05/13/2016 7.5  6.5 - 8.1 g/dL Final  . Albumin 05/13/2016 4.2  3.5 - 5.0 g/dL Final  . AST 05/13/2016 19  15 - 41 U/L Final  . ALT 05/13/2016 8* 14 - 54 U/L Final  . Alkaline Phosphatase 05/13/2016 83  38 - 126 U/L Final  . Total Bilirubin 05/13/2016 0.6  0.3 - 1.2 mg/dL Final  . GFR calc non Af Amer 05/13/2016 >60  >60 mL/min Final  . GFR calc Af Amer 05/13/2016 >60  >60 mL/min Final   Comment: (NOTE) The eGFR has been calculated using the CKD EPI equation. This calculation has not been validated in all clinical situations. eGFR's persistently <  60 mL/min signify possible Chronic Kidney Disease.   . Anion gap 05/13/2016 5  5 - 15 Final  . Folate 05/13/2016 26.0  >5.9 ng/mL Final  . Iron 05/13/2016 53  28 - 170 ug/dL Final  . TIBC 05/13/2016 277  250 - 450 ug/dL Final  .  Saturation Ratios 05/13/2016 19  10.4 - 31.8 % Final  . UIBC 05/13/2016 224  ug/dL Final  . Vitamin B-12 05/13/2016 2267* 180 - 914 pg/mL Final   Comment: (NOTE) This assay is not validated for testing neonatal or myeloproliferative syndrome specimens for Vitamin B12 levels. Performed at [facility]     No visits with results within 3 Day(s) from this visit. Latest known visit with results is:  Admission on 11/18/2015, Discharged on 11/18/2015  Component Date Value Ref Range Status  . SURGICAL PATHOLOGY 11/18/2015    Final-Edited                   Value:Surgical Pathology THIS IS AN ADDENDUM REPORT CASE: [id number] PATIENT: [facility] Surgical Pathology Report Addendum  Reason for Addendum #1:  Molecular studies  SPECIMEN SUBMITTED: A. Breast, left B. Sentinel lymph node #1  #2  CLINICAL HISTORY: None provided  PRE-OPERATIVE DIAGNOSIS: Left breast cancer  POST-OPERATIVE DIAGNOSIS: Same as pre-op     DIAGNOSIS: A. LEFT BREAST; TOTAL MASTECTOMY: - SOLITARY INVASIVE MAMMARY CARCINOMA OF NO SPECIAL TYPE, 3.9 CM. - DUCTAL CARCINOMA IN SITU. - LYMPH VASCULAR AND DERMAL LYMPHATIC INVASION IDENTIFIED. - THE SURGICAL MARGINS ARE NEGATIVE. - MICROCALCIFICATIONS IN BENIGN AND MALIGNANT TISSUE. - THREE BENIGN AXILLARY LYMPH NODES (0/3). - SEE SUMMARY BELOW.  B. SENTINEL LYMPH NODE #1 AND 2; EXCISION: - ONE OF TWO LYMPH NODES POSITIVE FOR MACRO METASTASES (1/2).   INVASIVE CARCINOMA OF THE BREAST: Complete Excision Specimens InvolvedA: Breast, left B: Sentinel lymph nod                         e #1  #2  SPECIMEN Procedure:     Total mastectomy (including nipple and skin) Lymph Node Sampling:     Sentinel lymph node(s) Specimen Laterality:     Left TUMOR Presence of Invasive Carcinoma:    Histologic Type: Invasive mammary carcinoma of no special type (ductal, not otherwise specified) Histologic Grade:   Glandular (acinar) / tubular  differentiation: Score 3 (< 10% of tumor area forming glandular / tubular structures) Nuclear pleomorphism: Score 2 (cells larger than normal with open vesicular nuclei, visible nucleoli, and moderate variability in both size and shape) Mitotic rate:  Score 1 (<=3 mitoses per mm2) Overall grade: Grade 2 (scores of 6 or 7) Ductal Carcinoma In Situ (DCIS):   DCIS is present Negative for extensive intraductal component (EIC) Tumor Size / Focality:   Tumor size: size of largest invasive carcinoma: Greatest dimension of largest focus of invasion > 1 mm: 74m Tumor focality:     Single focus of invasive carcinoma Tumor Extent Macr                         oscopic and Microscopic Extent of Tumor Skin:     Skin invasion: Invasive carcinoma directly invades into the dermis or epidermis without skin ulceration (this does not change the T stage) Skin satellite foci:     Negative for satellite foci Nipple DCIS:   DCIS does not involve the nipple epidermis Skeletal muscle:    Specimen does not contain skeletal  muscle Accessory Tumor Findings Lymph-Vascular Invasion: Present Treatment Effect: Response to Presurgical (Neoadjuvant) Therapy: No known presurgical therapy MARGINS Invasive Carcinoma: Margins negative for invasive carcinoma Distance from closest margin: Distance is > 10 mm Closest Negative Margin(s):   Posterior Ductal Carcinoma In Situ (DCIS):   Margins negative for DCIS (DCIS present in specimen) Distance of DCIS from closest margin:   Distance is > 10 mm Closest Negative Margin(s):   Posterior LYMPH NODES Regional Lymph Nodes:    Number of Sentinel Nodes Examined: Specify number 2 Number of lymph node(s) exam                         ined:  Number 5 Lymph node involvement:  Number of lymph nodes with macrometastases (> 2 mm): Number 1 Number of lymph nodes with micrometastases: Number 0 Number of lymph nodes with isolated tumor cells (<= 0.2 mm and <= 200 cells):    Number 0 STAGE (pTNM) TNM Descriptors:    Not applicable n/a Primary Tumor (Invasive Carcinoma) (pT):     pT2:  tumor > 20 mm but <= 50 mm in greatest dimension Regional lymph nodes (pN) (choose a category based on lymph nodes received with the specimen;  immunohistochemistry and / or molecular studies are not required) Category (pN): pN1a: metastases in 1 to 3 axillary lymph nodes, at least 1 metastasis greater than 2 mm Distant Metastasis (pM): Not applicable - pM cannot be determined from the submitted specimen(s) Comment(s): Invasive carcinoma is identified in tissue between the two masses. The tumor is entirely submitted and is present in 13 slides.   GROSS DESCRIPTION: A. Labeled: Left breast  Time in fixative: 3:10                          PM on 11/18/2015  Cold ischemic time: 40 minutes  Total fixation time: 6 1/2 Hours  Type of mastectomy: Simple  Laterality: Left  Weight of specimen: 707 grams  Size of specimen: 23.5 x 20.5 x 3.9 cm  Orientation of specimen: The letters M and L are written in surgical marker on the skin surface to designate the medial and lateral aspects specimen. Anterior half specimen inked blue, posterior specimen inked black.  Skin ellipse dimensions  description: 15.5 x 11.2 cm, unremarkable  Nipple/ areola: Nipple 1 cm in diameter, flattened. Areola 4.3 cm in diameter, unremarkable  Axillary tail: Absent  Biopsy site(s): 2 discrete biopsy sites identified  Number of discrete masses: 2  Location of mass(es): One mass is located approximately at the 6:00 position behind the nipple areolar complex, and the other is located in the approximate 1:00 position in the upper outer quadrant.  Distance between masses: Approximately 1.8 cm  Size of mass(es)/biopsy site(s):                          First mass approximately 1 x 0.9 x 0.5 cm, and second mass approximately 1 x 0.7 x 0.8 cm  Description of mass(es)/biopsy site(s): The  first mass is surrounded by a large amount of fibrous tissue measures approximately 2.5 x 2.5 x 1.5 cm. Both the first and second masses are extremely poorly defined, stellate, pale tan to white.  Margins: The first mass is grossly located 3.5 cm from the deep margin and the second mass is grossly located 3 cm from the deep margin  Gross involvement of skin/fascia/muscle by tumor:  Absent  Description of remaining breast: The rest the breast tissue is predominantly fatty with interspersed areas of dense white fibrous tissue  Lymph nodes: On dissection 3 lymph node candidates identified from 0.5 cm to 1.2 x 0.8 x 0.5 cm.  Block Summary: 1-skin 2-nipple 3-deep margin of resection 4-9 entire first mass including biopsy cavity and adjacent fibrous tissue at the left breast mass 10-12-entire second mass 13-16-breast tissue between                          first and second mass 17-representative section upper inner quadrant 18-representative section lower inner quadrant 19-representative section upper outer quadrant 20-representative section lower outer quadrant 21- 2 lymph node candidates submitted on 11/20/15 22-1 sectioned lymph node candidate submitted on 11/20/15  B. Labeled: Sentinel node #1 and 2  Tissue fragment(s): 2  Size: 0.4 x 0.3 x 0.2 cm and 1.2 x 0.6 x 0.6 cm  Description: 2 pink red lymph node candidates covered by adipose tissue, smaller inked blue  Entirely submitted in one cassette(s).         Final Diagnosis performed by Delorse Lek, MD.  Electronically signed 11/21/2015 2:59:43PM   The electronic signature indicates that the named Attending Pathologist has evaluated the specimen  Technical component performed at [facility], [address], [place], [place] [address] Lab: [phone] Dir: Darrick Penna. Evette Doffing, MD  Professional component performed at [facility], [facility],                          [place], [place], [place]  [address] Lab: [phone] Dir: Dellia Nims. Reuel Derby, MD   ADDENDUM: At Dr. Kem Parkinson request, FFPE tumor tissue (block A8) was submitted to [place], [place], for MammaPrint testing, a 70-gene expression profile. The assay result is reported as Low Risk, +0.192. Please see separate report, requisition number [id number], in [place].    Addendum #1 performed by Bryan Lemma, MD.  Electronically signed 12/24/2015 12:25:01PM    Technical component performed at [facility], [address], [place], [place] [address] Lab: [phone] Dir: Darrick Penna. Evette Doffing, MD  Professional component performed at [facility], [facility], [place], [place], [place] [address] Lab: [phone] Dir: Dellia Nims. Rubinas, MD      Assessment:  Kim Santiago is a 81 y.o. female with stage IIB left breast cancer s/p mastectomy on 11/07/2015.  Pathology revealed a 3.9 cm grade II invasive mammary carcinoma.  There was ductal carcinoma in situ (DCIS).  There was lymphvascular and dermal lymphatic invasion.  Margins were negative.  One of 2 sentinel lymph nodes were positive for macrometastasis.  In addition, there were 3 benign axillary lymph nodes (total 5 lymph nodes).  Tumor was estrogen receptor positive (100%), progesterone receptor positive (70%), and Her2/neu negative.  Pathologic stage was T2N1a.  CA27.29 was 30.4 on 11/15/2015.  MammaPrint on 12/18/2015 revealed a low risk of recurrence with a 5% risk of distant recurrence of 5 years and a 10% recurrence at 10 years.  She declined radiation.  She began Femara on 01/09/2016.  She has a little joint pain and a few hot flashes.  Bone density study on 12/05/2015 revealed osteopenia with a T-score of -1.5 in the left femoral neck.    She has a history of a myocardial infarction in 06/2012.  Echocardiogram revealed an EF of 35%.  Follow-up echo on 09/21/2015 revealed an EF of 55-60%.  Patient is  unaware of a history of heart failure.   She has a  history of thyroid carcinoma 40 years ago treated with I-131.   She has a new mild normocytic anemia.  Diet is good.  Colonoscopy on 07/25/2013 revealed 3 small polyps (60m proximal descending colon, 3 mm splenic flexure, 1 mm ascending colon).  She denies any melena or hematochezia.  TSH was normal on 12/26/2015.  Symptomatically, she is fatigued.  Exam is stable.  She has had 2 recent UTIs.  Plan: 1.  Labs today:  CBC with diff, CMP, CA27.29. 2.  Anemia work-up:  ferritin, iron studies, B12, folate. 3.  Continue Femara. 4.  RTC in 3 months for MD assess and labs (CBC with diff, CMP, CA27.29)   MLequita Asal MD  05/13/2016, 4:01 PM

## 2016-05-13 NOTE — Progress Notes (Signed)
Patient complains of fatigue.  Also states she is having joint pain especially in her fingers, hands, feet and toes.  Also states she feels "edgy" lately.  Further complains of several UTI's recently.  PCP is referring her to urologist.

## 2016-05-14 LAB — CANCER ANTIGEN 27.29: CA 27.29: 32.5 U/mL (ref 0.0–38.6)

## 2016-05-18 NOTE — Telephone Encounter (Signed)
Patient called requesting urine culture results. Informed patient that the culture came back with insignificant growth. Patient reported that she has finished her Cipro and is feeling better. She is in process of scheduling an appointment with a urologist as per her PCP due to recurrent UTI's.

## 2016-05-22 ENCOUNTER — Telehealth: Payer: Self-pay | Admitting: *Deleted

## 2016-05-22 NOTE — Telephone Encounter (Signed)
Please advise 

## 2016-05-22 NOTE — Telephone Encounter (Signed)
FYI

## 2016-05-22 NOTE — Telephone Encounter (Signed)
Next available would be 06/22/16 @ 11.30,

## 2016-05-22 NOTE — Telephone Encounter (Signed)
Please give a time and date to place pt on Dr. Derrel Nip Schedule, she would like a consultation about her medications and dermatologist referral.

## 2016-05-24 ENCOUNTER — Encounter: Payer: Self-pay | Admitting: Hematology and Oncology

## 2016-05-24 DIAGNOSIS — D649 Anemia, unspecified: Secondary | ICD-10-CM | POA: Insufficient documentation

## 2016-05-24 DIAGNOSIS — D638 Anemia in other chronic diseases classified elsewhere: Secondary | ICD-10-CM | POA: Insufficient documentation

## 2016-05-25 ENCOUNTER — Encounter: Payer: Self-pay | Admitting: Internal Medicine

## 2016-05-25 NOTE — Telephone Encounter (Signed)
Left a voicemail on 09/15 to return call to office

## 2016-05-26 ENCOUNTER — Other Ambulatory Visit: Payer: Self-pay | Admitting: Internal Medicine

## 2016-05-26 DIAGNOSIS — D492 Neoplasm of unspecified behavior of bone, soft tissue, and skin: Secondary | ICD-10-CM

## 2016-05-27 ENCOUNTER — Telehealth: Payer: Self-pay | Admitting: Hematology and Oncology

## 2016-05-27 ENCOUNTER — Encounter: Payer: Self-pay | Admitting: *Deleted

## 2016-05-27 NOTE — Telephone Encounter (Signed)
Last time she was in clinic pt had some tests done. She said liver enzymes and some others as well. She would really like to know the results and asked if someone could call her today before 12:30. She has afternoon appts and will not be available after that time. Thank you.

## 2016-05-27 NOTE — Telephone Encounter (Signed)
Called patient and let her know about her liver enzymes were good, her iron studies were normal and her b12 level was elevated and rec: that she take the b12 vitamin 1-2 days a week rather than everyday and she will

## 2016-06-08 ENCOUNTER — Other Ambulatory Visit (INDEPENDENT_AMBULATORY_CARE_PROVIDER_SITE_OTHER): Payer: PPO

## 2016-06-08 ENCOUNTER — Ambulatory Visit (INDEPENDENT_AMBULATORY_CARE_PROVIDER_SITE_OTHER): Payer: PPO | Admitting: Surgical

## 2016-06-08 DIAGNOSIS — E785 Hyperlipidemia, unspecified: Secondary | ICD-10-CM

## 2016-06-08 DIAGNOSIS — E032 Hypothyroidism due to medicaments and other exogenous substances: Secondary | ICD-10-CM | POA: Diagnosis not present

## 2016-06-08 DIAGNOSIS — Z79899 Other long term (current) drug therapy: Secondary | ICD-10-CM

## 2016-06-08 DIAGNOSIS — Z23 Encounter for immunization: Secondary | ICD-10-CM

## 2016-06-08 LAB — COMPREHENSIVE METABOLIC PANEL
ALBUMIN: 3.8 g/dL (ref 3.5–5.2)
ALT: 5 U/L (ref 0–35)
AST: 15 U/L (ref 0–37)
Alkaline Phosphatase: 79 U/L (ref 39–117)
BILIRUBIN TOTAL: 0.5 mg/dL (ref 0.2–1.2)
BUN: 18 mg/dL (ref 6–23)
CHLORIDE: 102 meq/L (ref 96–112)
CO2: 31 meq/L (ref 19–32)
Calcium: 8.9 mg/dL (ref 8.4–10.5)
Creatinine, Ser: 0.69 mg/dL (ref 0.40–1.20)
GFR: 87.13 mL/min (ref >60.00)
GLUCOSE: 82 mg/dL (ref 70–99)
POTASSIUM: 4.3 meq/L (ref 3.5–5.1)
SODIUM: 138 meq/L (ref 135–145)
Total Protein: 7.1 g/dL (ref 6.0–8.3)

## 2016-06-08 LAB — LIPID PANEL
CHOL/HDL RATIO: 4
Cholesterol: 170 mg/dL (ref 0–200)
HDL: 44.2 mg/dL (ref >39.00)
LDL Cholesterol: 108 mg/dL — ABNORMAL HIGH (ref 0–99)
NONHDL: 125.65
TRIGLYCERIDES: 86 mg/dL (ref 0.0–149.0)
VLDL: 17.2 mg/dL (ref 0.0–40.0)

## 2016-06-08 LAB — TSH: TSH: 1.52 u[IU]/mL (ref 0.35–4.50)

## 2016-06-10 ENCOUNTER — Encounter: Payer: Self-pay | Admitting: Internal Medicine

## 2016-06-19 DIAGNOSIS — L309 Dermatitis, unspecified: Secondary | ICD-10-CM | POA: Diagnosis not present

## 2016-06-19 DIAGNOSIS — I8393 Asymptomatic varicose veins of bilateral lower extremities: Secondary | ICD-10-CM | POA: Diagnosis not present

## 2016-06-25 ENCOUNTER — Encounter: Payer: Self-pay | Admitting: Internal Medicine

## 2016-06-26 ENCOUNTER — Other Ambulatory Visit: Payer: Self-pay | Admitting: Internal Medicine

## 2016-06-26 DIAGNOSIS — L930 Discoid lupus erythematosus: Secondary | ICD-10-CM

## 2016-06-26 DIAGNOSIS — L931 Subacute cutaneous lupus erythematosus: Secondary | ICD-10-CM | POA: Insufficient documentation

## 2016-06-29 ENCOUNTER — Telehealth: Payer: Self-pay | Admitting: *Deleted

## 2016-06-29 NOTE — Telephone Encounter (Signed)
Called to inquire if there is any contraindications to start taking Plaquenil for recent Dx of Discoid lupus. Please advise

## 2016-06-29 NOTE — Telephone Encounter (Signed)
Patient advised  Femara does not interact with Plaquenil and to have pharmacist check other meds

## 2016-06-29 NOTE — Telephone Encounter (Signed)
  Please call patient.  Femara does not interact.  Her phamacist can run the list of her other medications.  M

## 2016-07-02 ENCOUNTER — Telehealth: Payer: Self-pay | Admitting: Internal Medicine

## 2016-07-02 ENCOUNTER — Other Ambulatory Visit (INDEPENDENT_AMBULATORY_CARE_PROVIDER_SITE_OTHER): Payer: PPO

## 2016-07-02 DIAGNOSIS — L930 Discoid lupus erythematosus: Secondary | ICD-10-CM | POA: Diagnosis not present

## 2016-07-02 NOTE — Telephone Encounter (Signed)
Pt filled out a ROI for [facility] Dallie Dad, PA. She wants last labs and todays labs sent to her. Paper is up front in color folder.

## 2016-07-03 LAB — ANA W/REFLEX IF POSITIVE
ANA: POSITIVE — AB
ENA RNP Ab: <0.2 AI (ref 0.0–0.9)
ENA SM Ab Ser-aCnc: <0.2 AI (ref 0.0–0.9)
ENA SSA (RO) Ab: >8 AI — ABNORMAL HIGH (ref 0.0–0.9)
ENA SSB (LA) Ab: 7.1 AI — ABNORMAL HIGH (ref 0.0–0.9)
Scleroderma SCL-70: <0.2 AI (ref 0.0–0.9)

## 2016-07-03 LAB — ANTI-SMITH ANTIBODY: ENA SM AB SER-ACNC: NEGATIVE

## 2016-07-04 ENCOUNTER — Other Ambulatory Visit: Payer: Self-pay | Admitting: Internal Medicine

## 2016-07-04 DIAGNOSIS — L930 Discoid lupus erythematosus: Secondary | ICD-10-CM

## 2016-07-09 DIAGNOSIS — L931 Subacute cutaneous lupus erythematosus: Secondary | ICD-10-CM | POA: Diagnosis not present

## 2016-07-09 DIAGNOSIS — M3500 Sicca syndrome, unspecified: Secondary | ICD-10-CM | POA: Diagnosis not present

## 2016-07-09 DIAGNOSIS — C50912 Malignant neoplasm of unspecified site of left female breast: Secondary | ICD-10-CM | POA: Diagnosis not present

## 2016-07-09 DIAGNOSIS — C773 Secondary and unspecified malignant neoplasm of axilla and upper limb lymph nodes: Secondary | ICD-10-CM | POA: Diagnosis not present

## 2016-07-13 ENCOUNTER — Telehealth: Payer: Self-pay | Admitting: Internal Medicine

## 2016-07-13 NOTE — Telephone Encounter (Signed)
Pt wanted Dr Derrel Nip to know that she is on a new medication prescribed by Dr Jefm Bryant name of medication is Plaquenil. Medication he was concerned about mixing with that one is Losartan.  Pt is on Restasis for her eyes.   Call pt @ [phone]. Thank you!

## 2016-07-13 NOTE — Telephone Encounter (Signed)
Information has been faxed to dermatology.

## 2016-07-14 NOTE — Telephone Encounter (Signed)
There are no potential interactions between plaquenil and losartan per my review of the [employer] on line drug interaction database

## 2016-07-14 NOTE — Telephone Encounter (Signed)
Patient notified and voiced understanding. Up dated med list.

## 2016-08-05 DIAGNOSIS — Z79899 Other long term (current) drug therapy: Secondary | ICD-10-CM | POA: Diagnosis not present

## 2016-08-05 DIAGNOSIS — M3501 Sicca syndrome with keratoconjunctivitis: Secondary | ICD-10-CM | POA: Diagnosis not present

## 2016-08-05 DIAGNOSIS — H5203 Hypermetropia, bilateral: Secondary | ICD-10-CM | POA: Diagnosis not present

## 2016-08-05 DIAGNOSIS — L931 Subacute cutaneous lupus erythematosus: Secondary | ICD-10-CM | POA: Diagnosis not present

## 2016-08-05 DIAGNOSIS — H04123 Dry eye syndrome of bilateral lacrimal glands: Secondary | ICD-10-CM | POA: Diagnosis not present

## 2016-08-05 DIAGNOSIS — L282 Other prurigo: Secondary | ICD-10-CM | POA: Diagnosis not present

## 2016-08-05 DIAGNOSIS — M3210 Systemic lupus erythematosus, organ or system involvement unspecified: Secondary | ICD-10-CM | POA: Diagnosis not present

## 2016-08-05 DIAGNOSIS — M3500 Sicca syndrome, unspecified: Secondary | ICD-10-CM | POA: Diagnosis not present

## 2016-08-05 DIAGNOSIS — H2513 Age-related nuclear cataract, bilateral: Secondary | ICD-10-CM | POA: Diagnosis not present

## 2016-08-05 DIAGNOSIS — H52223 Regular astigmatism, bilateral: Secondary | ICD-10-CM | POA: Diagnosis not present

## 2016-08-06 ENCOUNTER — Telehealth: Payer: Self-pay | Admitting: Internal Medicine

## 2016-08-06 DIAGNOSIS — E034 Atrophy of thyroid (acquired): Secondary | ICD-10-CM

## 2016-08-06 MED ORDER — LOSARTAN POTASSIUM 25 MG PO TABS: 25.0000 mg | ORAL_TABLET | Freq: Every day | ORAL | 4 refills | Status: DC

## 2016-08-06 MED ORDER — OMEPRAZOLE 20 MG PO CPDR: 20.0000 mg | DELAYED_RELEASE_CAPSULE | Freq: Every day | ORAL | 4 refills | Status: DC

## 2016-08-06 MED ORDER — METOPROLOL SUCCINATE ER 25 MG PO TB24: 25.0000 mg | ORAL_TABLET | Freq: Every day | ORAL | 4 refills | Status: DC

## 2016-08-06 MED ORDER — LEVOTHYROXINE SODIUM 88 MCG PO TABS: 88.0000 ug | ORAL_TABLET | Freq: Every day | ORAL | 4 refills | Status: DC

## 2016-08-06 NOTE — Telephone Encounter (Signed)
Pt called and is requesting medication refill on metoprolol succinate (TOPROL XL) 25 MG 24 hr tablet, losartan (COZAAR) 25 MG tablet, levothyroxine (SYNTHROID, LEVOTHROID) 88 MCG tablet and omeprazole (PRILOSEC) 20 MG capsule.   [place], [place]  Call pt @ [phone]

## 2016-08-06 NOTE — Telephone Encounter (Signed)
Refills sent

## 2016-08-10 ENCOUNTER — Telehealth: Payer: Self-pay | Admitting: Internal Medicine

## 2016-08-10 NOTE — Telephone Encounter (Signed)
Pt was called to resch her AWV. In conversation pt is stating she is not doing well and having some things going on and is concerned and would like to talk to someone. Please advise?  Call pt @ [phone]. Thank you!

## 2016-08-10 NOTE — Telephone Encounter (Signed)
Spoke with patient states she saw Dr Jefm Bryant and was put on Plaquenil and she was allergic to this broke out in hives.    Dr Jefm Bryant prescribed 14 day script of Prednisone and Benandyl and she is still having some trouble itching.  Patient is wondering should she come followup with you or go back to Dr Jefm Bryant .  Please advise.

## 2016-08-10 NOTE — Telephone Encounter (Signed)
Patient advised of to contact Dr Jefm Bryant .  Patient verbalized understanding.    They're having her contact dermatology .

## 2016-08-10 NOTE — Telephone Encounter (Signed)
Kim Santiago

## 2016-08-11 ENCOUNTER — Ambulatory Visit: Payer: PPO

## 2016-08-11 ENCOUNTER — Telehealth: Payer: Self-pay | Admitting: *Deleted

## 2016-08-11 DIAGNOSIS — L270 Generalized skin eruption due to drugs and medicaments taken internally: Secondary | ICD-10-CM | POA: Diagnosis not present

## 2016-08-11 NOTE — Telephone Encounter (Signed)
Pt states she has had allergic reaction to a drug and saw her PCP and she is still on prednisone and will be on it for 7 days.  She wanted to know if it would affect her labs and I told her it can affect wbc but after knowing that she is on steroids then MD can adjust knowing she is on steroids. She is ok and will still come tom.

## 2016-08-12 ENCOUNTER — Inpatient Hospital Stay: Payer: PPO | Attending: Hematology and Oncology

## 2016-08-12 ENCOUNTER — Encounter: Payer: Self-pay | Admitting: Hematology and Oncology

## 2016-08-12 ENCOUNTER — Inpatient Hospital Stay (HOSPITAL_BASED_OUTPATIENT_CLINIC_OR_DEPARTMENT_OTHER): Payer: PPO | Admitting: Hematology and Oncology

## 2016-08-12 VITALS — BP 138/76 | HR 75 | Temp 97.7°F | Resp 18 | Wt 140.0 lb

## 2016-08-12 DIAGNOSIS — Z79811 Long term (current) use of aromatase inhibitors: Secondary | ICD-10-CM | POA: Diagnosis not present

## 2016-08-12 DIAGNOSIS — Z79899 Other long term (current) drug therapy: Secondary | ICD-10-CM | POA: Insufficient documentation

## 2016-08-12 DIAGNOSIS — M1990 Unspecified osteoarthritis, unspecified site: Secondary | ICD-10-CM | POA: Diagnosis not present

## 2016-08-12 DIAGNOSIS — E039 Hypothyroidism, unspecified: Secondary | ICD-10-CM

## 2016-08-12 DIAGNOSIS — Z800 Family history of malignant neoplasm of digestive organs: Secondary | ICD-10-CM

## 2016-08-12 DIAGNOSIS — Z170 Estrogen receptor positive status [ER+]: Secondary | ICD-10-CM

## 2016-08-12 DIAGNOSIS — Z9012 Acquired absence of left breast and nipple: Secondary | ICD-10-CM | POA: Insufficient documentation

## 2016-08-12 DIAGNOSIS — N189 Chronic kidney disease, unspecified: Secondary | ICD-10-CM | POA: Insufficient documentation

## 2016-08-12 DIAGNOSIS — Z7902 Long term (current) use of antithrombotics/antiplatelets: Secondary | ICD-10-CM | POA: Diagnosis not present

## 2016-08-12 DIAGNOSIS — I252 Old myocardial infarction: Secondary | ICD-10-CM

## 2016-08-12 DIAGNOSIS — R5383 Other fatigue: Secondary | ICD-10-CM | POA: Insufficient documentation

## 2016-08-12 DIAGNOSIS — Z87440 Personal history of urinary (tract) infections: Secondary | ICD-10-CM

## 2016-08-12 DIAGNOSIS — M3500 Sicca syndrome, unspecified: Secondary | ICD-10-CM | POA: Insufficient documentation

## 2016-08-12 DIAGNOSIS — I129 Hypertensive chronic kidney disease with stage 1 through stage 4 chronic kidney disease, or unspecified chronic kidney disease: Secondary | ICD-10-CM | POA: Insufficient documentation

## 2016-08-12 DIAGNOSIS — C50912 Malignant neoplasm of unspecified site of left female breast: Secondary | ICD-10-CM

## 2016-08-12 DIAGNOSIS — Z7982 Long term (current) use of aspirin: Secondary | ICD-10-CM | POA: Insufficient documentation

## 2016-08-12 DIAGNOSIS — C773 Secondary and unspecified malignant neoplasm of axilla and upper limb lymph nodes: Secondary | ICD-10-CM | POA: Diagnosis not present

## 2016-08-12 DIAGNOSIS — Z85850 Personal history of malignant neoplasm of thyroid: Secondary | ICD-10-CM | POA: Diagnosis not present

## 2016-08-12 LAB — CBC WITH DIFFERENTIAL/PLATELET
Basophils Absolute: 0.1 10*3/uL (ref 0–0.1)
Basophils Relative: 1 %
Eosinophils Absolute: 1 10*3/uL — ABNORMAL HIGH (ref 0–0.7)
Eosinophils Relative: 14 %
HCT: 35 % (ref 35.0–47.0)
Hemoglobin: 12 g/dL (ref 12.0–16.0)
Lymphocytes Relative: 25 %
Lymphs Abs: 1.7 10*3/uL (ref 1.0–3.6)
MCH: 32.1 pg (ref 26.0–34.0)
MCHC: 34.2 g/dL (ref 32.0–36.0)
MCV: 94 fL (ref 80.0–100.0)
Monocytes Absolute: 0.6 10*3/uL (ref 0.2–0.9)
Monocytes Relative: 10 %
Neutro Abs: 3.3 10*3/uL (ref 1.4–6.5)
Neutrophils Relative %: 50 %
Platelets: 214 10*3/uL (ref 150–440)
RBC: 3.72 MIL/uL — ABNORMAL LOW (ref 3.80–5.20)
RDW: 14.7 % — ABNORMAL HIGH (ref 11.5–14.5)
WBC: 6.7 10*3/uL (ref 3.6–11.0)

## 2016-08-12 LAB — COMPREHENSIVE METABOLIC PANEL
ALT: 7 U/L — ABNORMAL LOW (ref 14–54)
AST: 18 U/L (ref 15–41)
Albumin: 3.9 g/dL (ref 3.5–5.0)
Alkaline Phosphatase: 50 U/L (ref 38–126)
Anion gap: 4 — ABNORMAL LOW (ref 5–15)
BUN: 18 mg/dL (ref 6–20)
CO2: 30 mmol/L (ref 22–32)
Calcium: 8.6 mg/dL — ABNORMAL LOW (ref 8.9–10.3)
Chloride: 100 mmol/L — ABNORMAL LOW (ref 101–111)
Creatinine, Ser: 0.72 mg/dL (ref 0.44–1.00)
GFR calc Af Amer: >60 mL/min (ref >60)
GFR calc non Af Amer: >60 mL/min (ref >60)
Glucose, Bld: 116 mg/dL — ABNORMAL HIGH (ref 65–99)
Potassium: 4 mmol/L (ref 3.5–5.1)
Sodium: 134 mmol/L — ABNORMAL LOW (ref 135–145)
Total Bilirubin: 0.9 mg/dL (ref 0.3–1.2)
Total Protein: 6.6 g/dL (ref 6.5–8.1)

## 2016-08-12 MED ORDER — LETROZOLE 2.5 MG PO TABS: 2.5000 mg | ORAL_TABLET | Freq: Every day | ORAL | 1 refills | Status: DC

## 2016-08-12 NOTE — Progress Notes (Signed)
[facility] day:  08/12/2016   Chief Complaint: Kim Santiago is a 81 y.o. female with stage IIB left breast cancer who is seen for 3 month assessment on Femara.  HPI:  The patient was last seen in the medical oncology clinic on 05/13/2016.  At that time, she felt fatigued.  She had 2 recent UTIs.  She was taking Femara.  Labs included a hematocrit of 34, hemoglobin 11.7, and MCV 91.8.  Ferritin, iron studies, and folate were normal.  B12 was high (2267).  CA27.29 was 32.5 (normal).  Symptomatically, she has felt "fair".  She took Plaquenil for her lupus.  She developed hives.  She was treated with Benadryl and prednisone.  Her prednisone taper was extended.  She denies any breast concerns.   Past Medical History:  Diagnosis Date  . Arthritis   . Breast cancer in female [facility]) November 18, 2015   Left: 3.9 cm tumor, T2, 1/2 sentinel nodes positive for macro metastatic disease, N1, 3 negative nodes in the axillary tail., low Mammoprint score  . Cancer [facility])    thyroid takes levothyroxine  . Chronic kidney disease    UTI  . Genetic screening March 2017.   Mammoprint of left breast cancer: Low risk for recurrence.   . Hypertension   . hypothyroidism    secondary to thyroidectomy for thyroid ca  . Hypothyroidism   . Menopause 40s   natural, hot flashes and mood lability now gone, off prempro 7 months  . Myocardial infarction 2013  . Osteoporosis    Osteopenia  . Rosacea   . Sjoegren syndrome ([place])   . Stress-induced cardiomyopathy September of 2013   EF 35%. Peak troponin was 1.8.    Past Surgical History:  Procedure Laterality Date  . BACK SURGERY    . BREAST BIOPSY Left 10/30/15   positive, done in Dr. Dwyane Luo office  . CARDIAC CATHETERIZATION  05/2012   [facility]. No significant CAD. Ejection fraction of 35% due to stress-induced cardiomyopathy.  . CHOLECYSTECTOMY    . COLONOSCOPY    . DILATION AND CURETTAGE OF UTERUS    . KYPHOSIS  SURGERY  Feb 2008   L1, Dr. Mauri Pole  . LUMBAR DISC SURGERY     L4-L5  . SENTINEL NODE BIOPSY Left 11/18/2015   Procedure: SENTINEL NODE BIOPSY;  Surgeon: Robert Bellow, MD;  Location: [facility] ORS;  Service: General;  Laterality: Left;  . SHOULDER ARTHROSCOPY  2004   Left, Dr. Jefm Bryant  . SIMPLE MASTECTOMY WITH AXILLARY SENTINEL NODE BIOPSY Left 11/18/2015   Procedure: SIMPLE MASTECTOMY;  Surgeon: Robert Bellow, MD;  Location: [facility] ORS;  Service: General;  Laterality: Left;  . SPINE SURGERY     L4-5 diskectomy  . THYROIDECTOMY     Thyroid Cancer  . TONSILLECTOMY    . TUBAL LIGATION      Family History  Problem Relation Age of Onset  . COPD Father   . Cancer Father     esophageal  . Kidney disease Mother   . Heart disease Mother   . Kidney disease Sister   . Cancer Brother 28    colon cancer (both brothers)  . Heart attack Brother 57  . Heart disease Brother     Social History:  reports that she has never smoked. She has never used smokeless tobacco. She reports that she does not drink alcohol or use drugs.  She is from [place], [place].  She was married for  57 years.  Her husband died 47 months ago.  She lives alone in [place].  She has an adopted daughter and 2 grandchildren.  Her brother died of a MI following a cholecystectomy in 12/2015.  The patient is accompanied by her daughter today.  Allergies:  Allergies  Allergen Reactions  . Amoxicillin Rash  . Codeine Nausea And Vomiting  . Naprosyn [Naproxen] Swelling  . Orudis [Ketoprofen] Hives  . Plaquenil [Hydroxychloroquine] Hives  . Sulfathiazole Rash    Current Medications: Current Outpatient Prescriptions  Medication Sig Dispense Refill  . acetaminophen (TYLENOL) 650 MG CR tablet Take 650 mg by mouth every 8 (eight) hours as needed for pain.    Marland Kitchen aspirin 81 MG tablet Take 81 mg by mouth daily.    . Calcium Carbonate-Vitamin D (CALCIUM + D) 600-200 MG-UNIT TABS Take 2 tablets by mouth 2 (two) times daily.     .  cholecalciferol (VITAMIN D) 1000 UNITS tablet Take 2,000 Units by mouth daily.     . furosemide (LASIX) 20 MG tablet Once daily as needed for fluid retention 90 tablet 1  . letrozole (FEMARA) 2.5 MG tablet Take 1 tablet (2.5 mg total) by mouth daily. 90 tablet 1  . levothyroxine (SYNTHROID, LEVOTHROID) 88 MCG tablet Take 1 tablet (88 mcg total) by mouth daily. 90 tablet 4  . losartan (COZAAR) 25 MG tablet Take 1 tablet (25 mg total) by mouth daily. 90 tablet 4  . metoprolol succinate (TOPROL XL) 25 MG 24 hr tablet Take 1 tablet (25 mg total) by mouth daily. 90 tablet 4  . omeprazole (PRILOSEC) 20 MG capsule Take 1 capsule (20 mg total) by mouth daily. 90 capsule 4  . polyethylene glycol (MIRALAX / GLYCOLAX) packet Take 17 g by mouth daily.    . vitamin B-12 (CYANOCOBALAMIN) 1000 MCG tablet Take 1,000 mcg by mouth daily.    . Multiple Vitamins-Minerals (OSTEO COMPLEX PO) Take 1 tablet by mouth daily. Take one by mouth daily    . predniSONE (DELTASONE) 10 MG tablet     . tiZANidine (ZANAFLEX) 2 MG tablet Take 1 tablet (2 mg total) by mouth every 6 (six) hours as needed (for jaw pain). (Patient not taking: Reported on 08/12/2016) 90 tablet 0  . triamcinolone cream (KENALOG) 0.1 % Apply 1 application topically 2 (two) times daily. (Patient not taking: Reported on 08/12/2016) 30 g 0   No current facility-administered medications for this visit.     Review of Systems:  GENERAL:  Feels "fair".  No fevers or sweats.  Weight stable. PERFORMANCE STATUS (ECOG):  1 HEENT:  No visual changes, runny nose, sore throat, mouth sores or tenderness. Lungs: No shortness of breath or cough.  No hemoptysis. Cardiac:  No chest pain, palpitations, orthopnea, or PND. GI:  No nausea, vomiting, diarrhea, constipation, melena or hematochezia. GU:  No urgency, frequency, dysuria, or hematuria. Musculoskeletal:  No back pain.  Little joint pain.  No muscle tenderness. Extremities:  No pain or swelling. Skin:  Hair  thinning.  Interval hives (see HPI).  No rashes or skin changes. Neuro:  No headache, numbness or weakness, balance or coordination issues. Endocrine:  No diabetes.  Thyroid disease on Synthroid.  Few hot flashes.  No night sweats. Psych:   No mood changes, anxiety or depression.  Pain:  No focal pain. Review of systems:  All other systems reviewed and found to be negative.  Physical Exam: Blood pressure 138/76, pulse 75, temperature 97.7 F (36.5 C), temperature source Tympanic, resp.  rate 18, weight 139 lb 15.9 oz (63.5 kg). GENERAL:  Well developed, well nourished, woman sitting comfortably in the exam room in no acute distress. MENTAL STATUS:  Alert and oriented to person, place and time. HEAD:  Short gray hair.  Normocephalic, atraumatic, face symmetric, no Cushingoid features. EYES:  Glasses.  Blue eyes.  Pupils equal round and reactive to light and accomodation. No conjunctivitis or scleral icterus. ENT: Oropharynx clear without lesion. Tongue normal. Mucous membranes moist.  RESPIRATORY: Clear to auscultation without rales, wheezes or rhonchi. CARDIOVASCULAR: Regular rate and rhythm without murmur, rub or gallop. No JVD. BREASTS:  Left mastectomy without erythema or nodularity.  Tender right sided fibrocystic changes.  Right breast without masses, skin changes or nipple discharge. ABDOMEN: Soft, non-tender, with active bowel sounds, and no hepatosplenomegaly. SKIN: Resolving splotchy rash.  Some excoriation. EXTREMITIES: No edema, no skin discoloration or tenderness. No palpable cords. LYMPH NODES:  No palpable cervical, supraclavicular, axillary or inguinal adenopathy  NEUROLOGICAL: Unremarkable. PSYCH: Appropriate.  Labs: Appointment on 08/12/2016  Component Date Value Ref Range Status  . WBC 08/12/2016 6.7  3.6 - 11.0 K/uL Final  . RBC 08/12/2016 3.72* 3.80 - 5.20 MIL/uL Final  . Hemoglobin 08/12/2016 12.0  12.0 - 16.0 g/dL Final  . HCT 08/12/2016 35.0  35.0 - 47.0  % Final  . MCV 08/12/2016 94.0  80.0 - 100.0 fL Final  . MCH 08/12/2016 32.1  26.0 - 34.0 pg Final  . MCHC 08/12/2016 34.2  32.0 - 36.0 g/dL Final  . RDW 08/12/2016 14.7* 11.5 - 14.5 % Final  . Platelets 08/12/2016 214  150 - 440 K/uL Final  . Neutrophils Relative % 08/12/2016 50  % Final  . Neutro Abs 08/12/2016 3.3  1.4 - 6.5 K/uL Final  . Lymphocytes Relative 08/12/2016 25  % Final  . Lymphs Abs 08/12/2016 1.7  1.0 - 3.6 K/uL Final  . Monocytes Relative 08/12/2016 10  % Final  . Monocytes Absolute 08/12/2016 0.6  0.2 - 0.9 K/uL Final  . Eosinophils Relative 08/12/2016 14  % Final  . Eosinophils Absolute 08/12/2016 1.0* 0 - 0.7 K/uL Final  . Basophils Relative 08/12/2016 1  % Final  . Basophils Absolute 08/12/2016 0.1  0 - 0.1 K/uL Final  . Sodium 08/12/2016 134* 135 - 145 mmol/L Final  . Potassium 08/12/2016 4.0  3.5 - 5.1 mmol/L Final  . Chloride 08/12/2016 100* 101 - 111 mmol/L Final  . CO2 08/12/2016 30  22 - 32 mmol/L Final  . Glucose, Bld 08/12/2016 116* 65 - 99 mg/dL Final  . BUN 08/12/2016 18  6 - 20 mg/dL Final  . Creatinine, Ser 08/12/2016 0.72  0.44 - 1.00 mg/dL Final  . Calcium 08/12/2016 8.6* 8.9 - 10.3 mg/dL Final  . Total Protein 08/12/2016 6.6  6.5 - 8.1 g/dL Final  . Albumin 08/12/2016 3.9  3.5 - 5.0 g/dL Final  . AST 08/12/2016 18  15 - 41 U/L Final  . ALT 08/12/2016 7* 14 - 54 U/L Final  . Alkaline Phosphatase 08/12/2016 50  38 - 126 U/L Final  . Total Bilirubin 08/12/2016 0.9  0.3 - 1.2 mg/dL Final  . GFR calc non Af Amer 08/12/2016 >60  >60 mL/min Final  . GFR calc Af Amer 08/12/2016 >60  >60 mL/min Final   Comment: (NOTE) The eGFR has been calculated using the CKD EPI equation. This calculation has not been validated in all clinical situations. eGFR's persistently <60 mL/min signify possible Chronic Kidney Disease.   Marland Kitchen  Anion gap 08/12/2016 4* 5 - 15 Final    No visits with results within 3 Day(s) from this visit. Latest known visit with results  is:  Admission on 11/18/2015, Discharged on 11/18/2015  Component Date Value Ref Range Status  . SURGICAL PATHOLOGY 11/18/2015    Final-Edited                   Value:Surgical Pathology THIS IS AN ADDENDUM REPORT CASE: [id number] PATIENT: [facility] Surgical Pathology Report Addendum  Reason for Addendum #1:  Molecular studies  SPECIMEN SUBMITTED: A. Breast, left B. Sentinel lymph node #1  #2  CLINICAL HISTORY: None provided  PRE-OPERATIVE DIAGNOSIS: Left breast cancer  POST-OPERATIVE DIAGNOSIS: Same as pre-op     DIAGNOSIS: A. LEFT BREAST; TOTAL MASTECTOMY: - SOLITARY INVASIVE MAMMARY CARCINOMA OF NO SPECIAL TYPE, 3.9 CM. - DUCTAL CARCINOMA IN SITU. - LYMPH VASCULAR AND DERMAL LYMPHATIC INVASION IDENTIFIED. - THE SURGICAL MARGINS ARE NEGATIVE. - MICROCALCIFICATIONS IN BENIGN AND MALIGNANT TISSUE. - THREE BENIGN AXILLARY LYMPH NODES (0/3). - SEE SUMMARY BELOW.  B. SENTINEL LYMPH NODE #1 AND 2; EXCISION: - ONE OF TWO LYMPH NODES POSITIVE FOR MACRO METASTASES (1/2).   INVASIVE CARCINOMA OF THE BREAST: Complete Excision Specimens InvolvedA: Breast, left B: Sentinel lymph nod                         e #1  #2  SPECIMEN Procedure:     Total mastectomy (including nipple and skin) Lymph Node Sampling:     Sentinel lymph node(s) Specimen Laterality:     Left TUMOR Presence of Invasive Carcinoma:    Histologic Type: Invasive mammary carcinoma of no special type (ductal, not otherwise specified) Histologic Grade:   Glandular (acinar) / tubular differentiation: Score 3 (< 10% of tumor area forming glandular / tubular structures) Nuclear pleomorphism: Score 2 (cells larger than normal with open vesicular nuclei, visible nucleoli, and moderate variability in both size and shape) Mitotic rate:  Score 1 (<=3 mitoses per mm2) Overall grade: Grade 2 (scores of 6 or 7) Ductal Carcinoma In Situ (DCIS):   DCIS is present Negative for extensive intraductal component  (EIC) Tumor Size / Focality:   Tumor size: size of largest invasive carcinoma: Greatest dimension of largest focus of invasion > 1 mm: 39m Tumor focality:     Single focus of invasive carcinoma Tumor Extent Macr                         oscopic and Microscopic Extent of Tumor Skin:     Skin invasion: Invasive carcinoma directly invades into the dermis or epidermis without skin ulceration (this does not change the T stage) Skin satellite foci:     Negative for satellite foci Nipple DCIS:   DCIS does not involve the nipple epidermis Skeletal muscle:    Specimen does not contain skeletal muscle Accessory Tumor Findings Lymph-Vascular Invasion: Present Treatment Effect: Response to Presurgical (Neoadjuvant) Therapy: No known presurgical therapy MARGINS Invasive Carcinoma: Margins negative for invasive carcinoma Distance from closest margin: Distance is > 10 mm Closest Negative Margin(s):   Posterior Ductal Carcinoma In Situ (DCIS):   Margins negative for DCIS (DCIS present in specimen) Distance of DCIS from closest margin:   Distance is > 10 mm Closest Negative Margin(s):   Posterior LYMPH NODES Regional Lymph Nodes:    Number of Sentinel Nodes Examined: Specify number 2 Number of lymph node(s) exam  ined:  Number 5 Lymph node involvement:  Number of lymph nodes with macrometastases (> 2 mm): Number 1 Number of lymph nodes with micrometastases: Number 0 Number of lymph nodes with isolated tumor cells (<= 0.2 mm and <= 200 cells):   Number 0 STAGE (pTNM) TNM Descriptors:    Not applicable n/a Primary Tumor (Invasive Carcinoma) (pT):     pT2:  tumor > 20 mm but <= 50 mm in greatest dimension Regional lymph nodes (pN) (choose a category based on lymph nodes received with the specimen;  immunohistochemistry and / or molecular studies are not required) Category (pN): pN1a: metastases in 1 to 3 axillary lymph nodes, at least 1 metastasis greater than 2  mm Distant Metastasis (pM): Not applicable - pM cannot be determined from the submitted specimen(s) Comment(s): Invasive carcinoma is identified in tissue between the two masses. The tumor is entirely submitted and is present in 13 slides.   GROSS DESCRIPTION: A. Labeled: Left breast  Time in fixative: 3:10                          PM on 11/18/2015  Cold ischemic time: 40 minutes  Total fixation time: 6 1/2 Hours  Type of mastectomy: Simple  Laterality: Left  Weight of specimen: 707 grams  Size of specimen: 23.5 x 20.5 x 3.9 cm  Orientation of specimen: The letters M and L are written in surgical marker on the skin surface to designate the medial and lateral aspects specimen. Anterior half specimen inked blue, posterior specimen inked black.  Skin ellipse dimensions  description: 15.5 x 11.2 cm, unremarkable  Nipple/ areola: Nipple 1 cm in diameter, flattened. Areola 4.3 cm in diameter, unremarkable  Axillary tail: Absent  Biopsy site(s): 2 discrete biopsy sites identified  Number of discrete masses: 2  Location of mass(es): One mass is located approximately at the 6:00 position behind the nipple areolar complex, and the other is located in the approximate 1:00 position in the upper outer quadrant.  Distance between masses: Approximately 1.8 cm  Size of mass(es)/biopsy site(s):                          First mass approximately 1 x 0.9 x 0.5 cm, and second mass approximately 1 x 0.7 x 0.8 cm  Description of mass(es)/biopsy site(s): The first mass is surrounded by a large amount of fibrous tissue measures approximately 2.5 x 2.5 x 1.5 cm. Both the first and second masses are extremely poorly defined, stellate, pale tan to white.  Margins: The first mass is grossly located 3.5 cm from the deep margin and the second mass is grossly located 3 cm from the deep margin  Gross involvement of skin/fascia/muscle by tumor: Absent  Description of remaining breast: The  rest the breast tissue is predominantly fatty with interspersed areas of dense white fibrous tissue  Lymph nodes: On dissection 3 lymph node candidates identified from 0.5 cm to 1.2 x 0.8 x 0.5 cm.  Block Summary: 1-skin 2-nipple 3-deep margin of resection 4-9 entire first mass including biopsy cavity and adjacent fibrous tissue at the left breast mass 10-12-entire second mass 13-16-breast tissue between                          first and second mass 17-representative section upper inner quadrant 18-representative section lower inner quadrant 19-representative section upper outer quadrant 20-representative section lower  outer quadrant 21- 2 lymph node candidates submitted on 11/20/15 22-1 sectioned lymph node candidate submitted on 11/20/15  B. Labeled: Sentinel node #1 and 2  Tissue fragment(s): 2  Size: 0.4 x 0.3 x 0.2 cm and 1.2 x 0.6 x 0.6 cm  Description: 2 pink red lymph node candidates covered by adipose tissue, smaller inked blue  Entirely submitted in one cassette(s).         Final Diagnosis performed by Delorse Lek, MD.  Electronically signed 11/21/2015 2:59:43PM   The electronic signature indicates that the named Attending Pathologist has evaluated the specimen  Technical component performed at [facility], [address], [place], [place] [address] Lab: [phone] Dir: Darrick Penna. Evette Doffing, MD  Professional component performed at [facility], [facility],                          [place], [place], [place] [address] Lab: [phone] Dir: Dellia Nims. Reuel Derby, MD   ADDENDUM: At Dr. Kem Parkinson request, FFPE tumor tissue (block A8) was submitted to [place], [place], for MammaPrint testing, a 70-gene expression profile. The assay result is reported as Low Risk, +0.192. Please see separate report, requisition number [id number], in [place].    Addendum #1 performed by Bryan Lemma, MD.  Electronically signed 12/24/2015  12:25:01PM    Technical component performed at [facility], [address] [place], [place] [address] Lab: [phone] Dir: Darrick Penna. Evette Doffing, MD  Professional component performed at [facility], [facility], [place], [place], [place] [address] Lab: [phone] Dir: Dellia Nims. Rubinas, MD      Assessment:  Kim Santiago is a 81 y.o. female with stage IIB left breast cancer s/p mastectomy on 11/18/2015.  Pathology revealed a 3.9 cm grade II invasive mammary carcinoma.  There was ductal carcinoma in situ (DCIS).  There was lymphvascular and dermal lymphatic invasion.  Margins were negative.  One of 2 sentinel lymph nodes were positive for macrometastasis.  In addition, there were 3 benign axillary lymph nodes (total 5 lymph nodes).  Tumor was estrogen receptor positive (100%), progesterone receptor positive (70%), and Her2/neu negative.  Pathologic stage was T2N1a.  CA27.29 was 30.4 on 11/15/2015.  Bilateral mammogram and ultrasound on 11/11/2015 revealed a 3 cm left breast mass at the 6 o'clock position (1 cm from the nipple) and a 1.6 cm irregular hypoechoic left breast mass at 1 o'clock position (5 cm from the nipple) with overlying skin thickening.   MammaPrint on 12/18/2015 revealed a low risk of recurrence with a 5% risk of distant recurrence of 5 years and a 10% recurrence at 10 years.  She declined radiation.  She began Femara on 01/09/2016.  She has a little joint pain and a few hot flashes.  CA27.29 has been followed: 30.4 on 11/15/2015, 32.5 on 05/13/2016, and 19.0 on 08/12/2016.  Bone density study on 12/05/2015 revealed osteopenia with a T-score of -1.5 in the left femoral neck.  She is on calcium and vitamin D.  She has a history of a myocardial infarction in 06/2012.  Echocardiogram revealed an EF of 35%.  Follow-up echo on 09/21/2015 revealed an EF of 55-60%.  Patient is unaware of a history of heart failure.   She has a history of thyroid carcinoma 40  years ago treated with I-131.   She has a new mild normocytic anemia.  Diet is good.  Colonoscopy on 07/25/2013 revealed 3 small polyps (79m proximal descending colon, 3 mm splenic flexure,  1 mm ascending colon).  She denies any melena or hematochezia.  TSH was normal on 12/26/2015.  She has cutaneous lupus and Sjogren's.  She had a reaction to Plaquenil.  She is currently on an extended steroid taper.  Symptomatically, she is fatigued.  Exam is stable.  Plan: 1.  Labs today:  CBC with diff, CMP, CA27.29. 2.  Refill Femara. 3.  Anticipate right sided mammogram on 11/10/2016. 4.  RTC in 3 months for MD assessment and labs (CBC with diff, CMP, CA27.29).   Lequita Asal, MD  08/12/2016 , 10:47 AM

## 2016-08-12 NOTE — Progress Notes (Signed)
Patient recently started on Plaquenil and had a reaction (hives).  She is now on prednisone.  C/o itching from the prednisone.  Otherwise no complaints.  Tolerating Femara well.  Patient requesting refill for Femara.

## 2016-08-13 LAB — CANCER ANTIGEN 27.29: CA 27.29: 19 U/mL (ref 0.0–38.6)

## 2016-08-17 ENCOUNTER — Telehealth: Payer: Self-pay | Admitting: *Deleted

## 2016-08-17 ENCOUNTER — Other Ambulatory Visit: Payer: Self-pay | Admitting: *Deleted

## 2016-08-17 DIAGNOSIS — C50912 Malignant neoplasm of unspecified site of left female breast: Secondary | ICD-10-CM

## 2016-08-17 DIAGNOSIS — Z170 Estrogen receptor positive status [ER+]: Principal | ICD-10-CM

## 2016-08-17 MED ORDER — LETROZOLE 2.5 MG PO TABS: 2.5000 mg | ORAL_TABLET | Freq: Every day | ORAL | 1 refills | Status: DC

## 2016-08-17 NOTE — Telephone Encounter (Signed)
The pt called and states that her letrozole was sent to [facility] and she like it to be sent to mail in pharmacy on her chart. I called [facility] and told them to disregard rx and I will send it to mail in pharmacy.  I electronically sent to mail in pharmacy.  I told pt that I would cancel the one local and order it electronic to mail in pharmacy andhse was agreeable.

## 2016-08-25 ENCOUNTER — Other Ambulatory Visit: Payer: Self-pay

## 2016-08-25 ENCOUNTER — Emergency Department: Payer: PPO

## 2016-08-25 ENCOUNTER — Encounter: Payer: Self-pay | Admitting: Medical Oncology

## 2016-08-25 ENCOUNTER — Emergency Department
Admission: EM | Admit: 2016-08-25 | Discharge: 2016-08-25 | Disposition: A | Discharge status: Home / Self Care | Payer: PPO | Attending: Emergency Medicine | Admitting: Emergency Medicine

## 2016-08-25 DIAGNOSIS — Z85850 Personal history of malignant neoplasm of thyroid: Secondary | ICD-10-CM | POA: Diagnosis not present

## 2016-08-25 DIAGNOSIS — Z853 Personal history of malignant neoplasm of breast: Secondary | ICD-10-CM | POA: Insufficient documentation

## 2016-08-25 DIAGNOSIS — I129 Hypertensive chronic kidney disease with stage 1 through stage 4 chronic kidney disease, or unspecified chronic kidney disease: Secondary | ICD-10-CM | POA: Diagnosis not present

## 2016-08-25 DIAGNOSIS — R0602 Shortness of breath: Secondary | ICD-10-CM | POA: Insufficient documentation

## 2016-08-25 DIAGNOSIS — N189 Chronic kidney disease, unspecified: Secondary | ICD-10-CM | POA: Insufficient documentation

## 2016-08-25 DIAGNOSIS — R072 Precordial pain: Secondary | ICD-10-CM

## 2016-08-25 DIAGNOSIS — E039 Hypothyroidism, unspecified: Secondary | ICD-10-CM | POA: Diagnosis not present

## 2016-08-25 DIAGNOSIS — Z7982 Long term (current) use of aspirin: Secondary | ICD-10-CM | POA: Diagnosis not present

## 2016-08-25 DIAGNOSIS — R079 Chest pain, unspecified: Secondary | ICD-10-CM | POA: Diagnosis not present

## 2016-08-25 DIAGNOSIS — Z79899 Other long term (current) drug therapy: Secondary | ICD-10-CM | POA: Diagnosis not present

## 2016-08-25 LAB — BASIC METABOLIC PANEL
ANION GAP: 7 (ref 5–15)
BUN: 12 mg/dL (ref 6–20)
CALCIUM: 8.7 mg/dL — AB (ref 8.9–10.3)
CO2: 26 mmol/L (ref 22–32)
Chloride: 99 mmol/L — ABNORMAL LOW (ref 101–111)
Creatinine, Ser: 0.7 mg/dL (ref 0.44–1.00)
GFR calc Af Amer: >60 mL/min (ref >60)
GLUCOSE: 104 mg/dL — AB (ref 65–99)
Potassium: 4 mmol/L (ref 3.5–5.1)
Sodium: 132 mmol/L — ABNORMAL LOW (ref 135–145)

## 2016-08-25 LAB — CBC
HCT: 34 % — ABNORMAL LOW (ref 35.0–47.0)
Hemoglobin: 11.9 g/dL — ABNORMAL LOW (ref 12.0–16.0)
MCH: 32.9 pg (ref 26.0–34.0)
MCHC: 35.1 g/dL (ref 32.0–36.0)
MCV: 93.7 fL (ref 80.0–100.0)
PLATELETS: 182 10*3/uL (ref 150–440)
RBC: 3.63 MIL/uL — ABNORMAL LOW (ref 3.80–5.20)
RDW: 15.1 % — AB (ref 11.5–14.5)
WBC: 6.1 10*3/uL (ref 3.6–11.0)

## 2016-08-25 LAB — TROPONIN I: Troponin I: <0.03 ng/mL (ref <0.03)

## 2016-08-25 LAB — FIBRIN DERIVATIVES D-DIMER (ARMC ONLY): FIBRIN DERIVATIVES D-DIMER (ARMC): 449 (ref 0–499)

## 2016-08-25 NOTE — ED Provider Notes (Signed)
[facility] Emergency Department Provider Note   ____________________________________________   First MD Initiated Contact with Patient 08/25/16 0805     (approximate)  I have reviewed the triage vital signs and the nursing notes.   HISTORY  Chief Complaint Chest Pain   HPI Kim Santiago is a 81 y.o. female who complained of pain in the low center of her chest back and radiating up to the jaw woke her up last night. His been coming and going for the last several days. She has some shortness of breath with it and she was sweating when she woke up but not nauseated. She reports activity does not have any influence on the pain neither does eating or movement. The pain just seems to come and go whenever wants to.   Past Medical History:  Diagnosis Date  . Arthritis   . Breast cancer in female [facility]) November 18, 2015   Left: 3.9 cm tumor, T2, 1/2 sentinel nodes positive for macro metastatic disease, N1, 3 negative nodes in the axillary tail., low Mammoprint score  . Cancer [facility])    thyroid takes levothyroxine  . Chronic kidney disease    UTI  . Genetic screening March 2017.   Mammoprint of left breast cancer: Low risk for recurrence.   . Hypertension   . hypothyroidism    secondary to thyroidectomy for thyroid ca  . Hypothyroidism   . Menopause 40s   natural, hot flashes and mood lability now gone, off prempro 7 months  . Myocardial infarction 2013  . Osteoporosis    Osteopenia  . Rosacea   . Sjoegren syndrome ([place])   . Stress-induced cardiomyopathy September of 2013   EF 35%. Peak troponin was 1.8.    Patient Active Problem List   Diagnosis Date Noted  . Discoid lupus 06/26/2016  . Absolute anemia 05/24/2016  . Recurrent UTI 04/29/2016  . TMJ syndrome 03/31/2016  . Vertigo 03/12/2016  . Hearing loss 03/12/2016  . Osteopenia 12/05/2015  . Malignant neoplasm of left female breast ([place]) 11/26/2015  . Breast cancer, female, left 11/05/2015    . Advanced directives, counseling/discussion 09/18/2015  . Psoriasis and similar disorder 05/07/2015  . Contact dermatitis and eczema 05/07/2015  . History of heart attack 06/12/2014  . HLD (hyperlipidemia) 06/12/2014  . Hx of thyroid cancer 06/12/2014  . Adenomatous polyp 06/12/2014  . Depression, major, single episode, moderate ([place]) 05/29/2014  . Grief 04/08/2014  . Sjogren's syndrome ([place]) 03/09/2014  . Insomnia due to stress 03/06/2014  . Multiple actinic keratoses 09/22/2013  . Cervicalgia of occipito-atlanto-axial region 09/22/2013  . Hyperlipidemia 12/28/2012  . Medicare annual wellness visit, subsequent 12/28/2012  . Iatrogenic hypothyroidism 10/02/2011    Past Surgical History:  Procedure Laterality Date  . BACK SURGERY    . BREAST BIOPSY Left 10/30/15   positive, done in Dr. Dwyane Luo office  . CARDIAC CATHETERIZATION  05/2012   [facility]. No significant CAD. Ejection fraction of 35% due to stress-induced cardiomyopathy.  . CHOLECYSTECTOMY    . COLONOSCOPY    . DILATION AND CURETTAGE OF UTERUS    . KYPHOSIS SURGERY  Feb 2008   L1, Dr. Mauri Pole  . LUMBAR DISC SURGERY     L4-L5  . SENTINEL NODE BIOPSY Left 11/18/2015   Procedure: SENTINEL NODE BIOPSY;  Surgeon: Robert Bellow, MD;  Location: [facility] ORS;  Service: General;  Laterality: Left;  . SHOULDER ARTHROSCOPY  2004   Left, Dr. Jefm Bryant  . SIMPLE MASTECTOMY WITH AXILLARY SENTINEL NODE  BIOPSY Left 11/18/2015   Procedure: SIMPLE MASTECTOMY;  Surgeon: Robert Bellow, MD;  Location: [facility] ORS;  Service: General;  Laterality: Left;  . SPINE SURGERY     L4-5 diskectomy  . THYROIDECTOMY     Thyroid Cancer  . TONSILLECTOMY    . TUBAL LIGATION      Prior to Admission medications   Medication Sig Start Date End Date Taking? Authorizing Provider  acetaminophen (TYLENOL) 650 MG CR tablet Take 650 mg by mouth every 8 (eight) hours as needed for pain.   Yes Historical Provider, MD  aspirin 81 MG tablet Take 81 mg by mouth  daily.   Yes Historical Provider, MD  Calcium Carbonate-Vitamin D (CALCIUM + D) 600-200 MG-UNIT TABS Take 2 tablets by mouth 2 (two) times daily.    Yes Historical Provider, MD  cholecalciferol (VITAMIN D) 1000 UNITS tablet Take 2,000 Units by mouth daily.    Yes Historical Provider, MD  letrozole (FEMARA) 2.5 MG tablet Take 1 tablet (2.5 mg total) by mouth daily. 08/17/16  Yes Lequita Asal, MD  levothyroxine (SYNTHROID, LEVOTHROID) 88 MCG tablet Take 1 tablet (88 mcg total) by mouth daily. 08/06/16  Yes [name], MD  losartan (COZAAR) 25 MG tablet Take 1 tablet (25 mg total) by mouth daily. 08/06/16  Yes [name], MD  metoprolol succinate (TOPROL XL) 25 MG 24 hr tablet Take 1 tablet (25 mg total) by mouth daily. 08/06/16  Yes [name], MD  Multiple Vitamins-Minerals (OSTEO COMPLEX PO) Take 1 tablet by mouth daily. Take one by mouth daily   Yes Historical Provider, MD  omeprazole (PRILOSEC) 20 MG capsule Take 1 capsule (20 mg total) by mouth daily. 08/06/16  Yes [name], MD  polyethylene glycol (MIRALAX / GLYCOLAX) packet Take 17 g by mouth daily.   Yes Historical Provider, MD  predniSONE (DELTASONE) 10 MG tablet  08/05/16  Yes Historical Provider, MD  vitamin B-12 (CYANOCOBALAMIN) 1000 MCG tablet Take 1,000 mcg by mouth daily.   Yes Historical Provider, MD  furosemide (LASIX) 20 MG tablet Once daily as needed for fluid retention 12/26/15   [name], MD  tiZANidine (ZANAFLEX) 2 MG tablet Take 1 tablet (2 mg total) by mouth every 6 (six) hours as needed (for jaw pain). Patient not taking: Reported on 08/12/2016 04/06/16   [name], MD  triamcinolone cream (KENALOG) 0.1 % Apply 1 application topically 2 (two) times daily. Patient not taking: Reported on 08/12/2016 05/06/15   [name], MD    Allergies Amoxicillin; Codeine; Naprosyn [naproxen]; Orudis [ketoprofen]; Plaquenil [hydroxychloroquine]; and Sulfathiazole  Family History  Problem Relation Age of  Onset  . COPD Father   . Cancer Father     esophageal  . Kidney disease Mother   . Heart disease Mother   . Kidney disease Sister   . Cancer Brother 52    colon cancer (both brothers)  . Heart attack Brother 79  . Heart disease Brother     Social History Social History  Substance Use Topics  . Smoking status: Never Smoker  . Smokeless tobacco: Never Used  . Alcohol use No    Review of Systems Constitutional: No fever/chills Eyes: No visual changes. ENT: No sore throat. Cardiovascular: The history of present illness Respiratory: See history of present illness Gastrointestinal: No abdominal pain.  No nausea, no vomiting.  No diarrhea.  No constipation. Genitourinary: Negative for dysuria. Musculoskeletal: Negative for back pain. Skin: Negative for rash. Neurological:  Negative for headaches, focal weakness or numbness.  10-point ROS otherwise negative.  ____________________________________________   PHYSICAL EXAM:  VITAL SIGNS: ED Triage Vitals  Enc Vitals Group     BP 08/25/16 0741 134/71     Pulse Rate 08/25/16 0741 85     Resp 08/25/16 0741 18     Temp 08/25/16 0741 97.9 F (36.6 C)     Temp Source 08/25/16 0741 Oral     SpO2 08/25/16 0741 99 %     Weight 08/25/16 0727 130 lb (59 kg)     Height 08/25/16 0727 5\' 4"  (1.626 m)     Head Circumference --      Peak Flow --      Pain Score 08/25/16 0727 5     Pain Loc --      Pain Edu? --      Excl. in [place]? --    Constitutional: Alert and oriented. Well appearing and in no acute distress. Eyes: Conjunctivae are normal. PERRL. EOMI. Head: Atraumatic. Nose: No congestion/rhinnorhea. Mouth/Throat: Mucous membranes are moist.  Oropharynx non-erythematous. Neck: No stridor.  Cardiovascular: Normal rate, regular rhythm. Grossly normal heart sounds.  Good peripheral circulation. Respiratory: Normal respiratory effort.  No retractions. Lungs CTAB. Gastrointestinal: Soft and nontender. No distention. No abdominal  bruits. No CVA tenderness. Musculoskeletal: No lower extremity tenderness nor edema.  No joint effusions. Neurologic:  Normal speech and language. No gross focal neurologic deficits are appreciated. No gait instability.   ____________________________________________   LABS (all labs ordered are listed, but only abnormal results are displayed)  Labs Reviewed  BASIC METABOLIC PANEL - Abnormal; Notable for the following:       Result Value   Sodium 132 (*)    Chloride 99 (*)    Glucose, Bld 104 (*)    Calcium 8.7 (*)    All other components within normal limits  CBC - Abnormal; Notable for the following:    RBC 3.63 (*)    Hemoglobin 11.9 (*)    HCT 34.0 (*)    RDW 15.1 (*)    All other components within normal limits  TROPONIN I  FIBRIN DERIVATIVES D-DIMER ([facility] ONLY)  TROPONIN I   ____________________________________________  EKG  EKG read and interpreted by me shows normal sinus rhythm rate of 90 rightward axis no acute changes ____________________________________________  RADIOLOGY dy Result   CLINICAL DATA:  Chest pain and shortness of breath for 2 days. Left breast carcinoma. Sjogren syndrome.  EXAM: CHEST  2 VIEW  COMPARISON:  12/10/2004  FINDINGS: The heart size and mediastinal contours are within normal limits. Both lungs are clear. No evidence of pleural effusion or pneumothorax. Previous left mastectomy and L1 vertebroplasty noted.  IMPRESSION: No active cardiopulmonary disease.   Electronically Signed   By: Earle Gell M.D.   On: 08/25/2016 08:10      ____________________________________________   PROCEDURES  Procedure(s) performed:   Procedures  Critical Care performed:   ____________________________________________   INITIAL IMPRESSION / ASSESSMENT AND PLAN / ED COURSE  Pertinent labs & imaging results that were available during my care of the patient were reviewed by me and considered in my medical decision making (see  chart for details).    Clinical Course      ____________________________________________   FINAL CLINICAL IMPRESSION(S) / ED DIAGNOSES  Final diagnoses:  Precordial pain      NEW MEDICATIONS STARTED DURING THIS VISIT:  Discharge Medication List as of 08/25/2016 12:27 PM  Note:  This document was prepared using Dragon voice recognition software and may include unintentional dictation errors.    Nena Polio, MD 08/25/16 [phone]

## 2016-08-25 NOTE — ED Triage Notes (Signed)
Pt reports for the past 2 days she has been having a generalized chest heaviness with pain to her back with sob. Denies cough.

## 2016-08-25 NOTE — Discharge Instructions (Signed)
Dr. Humphrey Rolls will see you in the office now. His office is next [facility] behind the [facility].

## 2016-09-11 ENCOUNTER — Ambulatory Visit (INDEPENDENT_AMBULATORY_CARE_PROVIDER_SITE_OTHER): Payer: PPO | Admitting: Internal Medicine

## 2016-09-11 ENCOUNTER — Encounter: Payer: Self-pay | Admitting: Internal Medicine

## 2016-09-11 DIAGNOSIS — F331 Major depressive disorder, recurrent, moderate: Secondary | ICD-10-CM

## 2016-09-11 MED ORDER — MIRTAZAPINE 7.5 MG PO TABS
7.5000 mg | ORAL_TABLET | Freq: Every day | ORAL | 0 refills | Status: DC
Start: 2016-09-11 — End: 2016-11-20

## 2016-09-11 NOTE — Progress Notes (Signed)
Subjective:  Patient ID: Kim Santiago, female    DOB: May 24, 1935  Age: 82 y.o. MRN: [id number]  CC: The encounter diagnosis was Major depressive disorder, recurrent episode, moderate ([place]).  HPI [name] presents for evaluation of multiple symptoms . She is accompanied by her daughter.    She states that for for the past 6 weeks she has had vertigo,  nausea with occasional vomiting and persistent anxiety .   She believes that her symptoms started with initiation of placquenil  In early November for treatment of discoid lupus and Sjogren's, beginning with an episode of diffuse body rash and tongue swelling   in late November.  A 12 day prednisone was prescribed starting with 60 mg daily dose. Marland KitchenShe was referred back to to dermatology for biopsy and prednisone dose was increased before it was tapered to off on December 22.  She has not started MTX advised by Dr. Evorn Gong.   She developed a viral gastrointestinal illness suddenly on  December 6 and had several episodes of vomiting for 24 hours.  Has had no vomiting since then, but continues to feel nauseated  And states that "my whole body feels like it's nauseated and dying".  The symptoms are intermittent but recur 2 or 3 times daily. sleeping from 2 to 5 hours per night , and often wakes up with a feeling of malaise and fear .  No appetite. Daughter thinks she is very depressed. And anxious     Went to ER on Dec 19 with complaint of recurrent atypical chest pain radiating to left jaw  Fr the last several days.  History of AMI and stress induced cardiomyopathy , most recent EF normal. . Was discharged home after cardiac enzymes and ekg were not concerning for ischemia. femara  Saw Oncology in late December for xrt  Now on follow up on left breast cancer, /sp mastectomy and    Outpatient Medications Prior to Visit  Medication Sig Dispense Refill  . acetaminophen (TYLENOL) 650 MG CR tablet Take 650 mg by mouth every 8 (eight) hours as  needed for pain.    Marland Kitchen aspirin 81 MG tablet Take 81 mg by mouth daily.    . Calcium Carbonate-Vitamin D (CALCIUM + D) 600-200 MG-UNIT TABS Take 2 tablets by mouth 2 (two) times daily.     . cholecalciferol (VITAMIN D) 1000 UNITS tablet Take 2,000 Units by mouth daily.     . furosemide (LASIX) 20 MG tablet Once daily as needed for fluid retention 90 tablet 1  . letrozole (FEMARA) 2.5 MG tablet Take 1 tablet (2.5 mg total) by mouth daily. 90 tablet 1  . levothyroxine (SYNTHROID, LEVOTHROID) 88 MCG tablet Take 1 tablet (88 mcg total) by mouth daily. 90 tablet 4  . losartan (COZAAR) 25 MG tablet Take 1 tablet (25 mg total) by mouth daily. 90 tablet 4  . metoprolol succinate (TOPROL XL) 25 MG 24 hr tablet Take 1 tablet (25 mg total) by mouth daily. 90 tablet 4  . Multiple Vitamins-Minerals (OSTEO COMPLEX PO) Take 1 tablet by mouth daily. Take one by mouth daily    . omeprazole (PRILOSEC) 20 MG capsule Take 1 capsule (20 mg total) by mouth daily. 90 capsule 4  . polyethylene glycol (MIRALAX / GLYCOLAX) packet Take 17 g by mouth daily.    Marland Kitchen tiZANidine (ZANAFLEX) 2 MG tablet Take 1 tablet (2 mg total) by mouth every 6 (six) hours as needed (for jaw pain). 90 tablet 0  .  triamcinolone cream (KENALOG) 0.1 % Apply 1 application topically 2 (two) times daily. 30 g 0  . vitamin B-12 (CYANOCOBALAMIN) 1000 MCG tablet Take 1,000 mcg by mouth daily.    . predniSONE (DELTASONE) 10 MG tablet      No facility-administered medications prior to visit.     Review of Systems;  Patient denies headache, fevers, malaise, unintentional weight loss, skin rash, eye pain, sinus congestion and sinus pain, sore throat, dysphagia,  hemoptysis , cough, dyspnea, wheezing, chest pain, palpitations, orthopnea, edema, abdominal pain, nausea, melena, diarrhea, constipation, flank pain, dysuria, hematuria, urinary  Frequency, nocturia, numbness, tingling, seizures,  Focal weakness, Loss of consciousness,  Tremor, insomnia, depression,  anxiety, and suicidal ideation.      Objective:  BP (!) 146/70   Pulse 83   Temp 98.2 F (36.8 C) (Oral)   Resp 16   Ht 5\' 4"  (1.626 m)   Wt 132 lb 4 oz (60 kg)   SpO2 96%   BMI 22.70 kg/m   BP Readings from Last 3 Encounters:  09/11/16 (!) 146/70  08/25/16 117/64  08/12/16 138/76    Wt Readings from Last 3 Encounters:  09/11/16 132 lb 4 oz (60 kg)  08/25/16 130 lb (59 kg)  08/12/16 139 lb 15.9 oz (63.5 kg)    General appearance: alert, cooperative and appears stated age Ears: normal TM's and external ear canals both ears Throat: lips, mucosa, and tongue normal; teeth and gums normal Neck: no adenopathy, no carotid bruit, supple, symmetrical, trachea midline and thyroid not enlarged, symmetric, no tenderness/mass/nodules Back: symmetric, no curvature. ROM normal. No CVA tenderness. Lungs: clear to auscultation bilaterally Heart: regular rate and rhythm, S1, S2 normal, no murmur, click, rub or gallop Abdomen: soft, non-tender; bowel sounds normal; no masses,  no organomegaly Pulses: 2+ and symmetric Skin: Skin color, texture, turgor normal. No rashes or lesions Lymph nodes: Cervical, supraclavicular, and axillary nodes normal.  No results found for: HGBA1C  Lab Results  Component Value Date   CREATININE 0.70 08/25/2016   CREATININE 0.72 08/12/2016   CREATININE 0.69 06/08/2016    Lab Results  Component Value Date   WBC 6.1 08/25/2016   HGB 11.9 (L) 08/25/2016   HCT 34.0 (L) 08/25/2016   PLT 182 08/25/2016   GLUCOSE 104 (H) 08/25/2016   CHOL 170 06/08/2016   TRIG 86.0 06/08/2016   HDL 44.20 06/08/2016   LDLDIRECT 117.6 12/27/2012   LDLCALC 108 (H) 06/08/2016   ALT 7 (L) 08/12/2016   AST 18 08/12/2016   NA 132 (L) 08/25/2016   K 4.0 08/25/2016   CL 99 (L) 08/25/2016   CREATININE 0.70 08/25/2016   BUN 12 08/25/2016   CO2 26 08/25/2016   TSH 1.52 06/08/2016    Dg Chest 2 View  Result Date: 08/25/2016 CLINICAL DATA:  Chest pain and shortness of  breath for 2 days. Left breast carcinoma. Sjogren syndrome. EXAM: CHEST  2 VIEW COMPARISON:  12/10/2004 FINDINGS: The heart size and mediastinal contours are within normal limits. Both lungs are clear. No evidence of pleural effusion or pneumothorax. Previous left mastectomy and L1 vertebroplasty noted. IMPRESSION: No active cardiopulmonary disease. Electronically Signed   By: Earle Gell M.D.   On: 08/25/2016 08:10    Assessment & Plan:   Problem List Items Addressed This Visit    Major depressive disorder, recurrent episode, moderate ([place])    Suggested by her multiple somatic complaints today with largely normal appearance/exam and recent labs. Resuming remeron starting at 7.5 mg  daily at bedtime,  Return in 2 weeks      Relevant Medications   mirtazapine (REMERON) 7.5 MG tablet     A total of 40 minutes was spent with patient and daughter, more than half of which was spent in counseling patient on the above mentioned issues , reviewing and explaining recent labs and imaging studies done, and coordination of care.  I have discontinued Ms. Deyton's predniSONE. I am also having her start on mirtazapine. Additionally, I am having her maintain her cholecalciferol, Calcium Carbonate-Vitamin D, Multiple Vitamins-Minerals (OSTEO COMPLEX PO), vitamin B-12, aspirin, polyethylene glycol, triamcinolone cream, acetaminophen, furosemide, tiZANidine, metoprolol succinate, levothyroxine, losartan, omeprazole, and letrozole.  Meds ordered this encounter  Medications  . mirtazapine (REMERON) 7.5 MG tablet    Sig: Take 1 tablet (7.5 mg total) by mouth at bedtime. Increase after 2 weeks to 2 tablets daily at bedtime    Dispense:  90 tablet    Refill:  0    Medications Discontinued During This Encounter  Medication Reason  . predniSONE (DELTASONE) 10 MG tablet Completed Course    Follow-up: Return in about 2 weeks (around 09/25/2016).   [name], MD

## 2016-09-11 NOTE — Patient Instructions (Addendum)
You can use  magnesium as needed or daily  For constipation   anxiety .   I am recommending stopping the 5HTP when you start the mirtazapine at bedtime.  Start with 7.5 mg in the evening before bed with a snack.  Return to see me in 2 weeks    Ok to continue  Daily intake of  3000 IUs of D3    You might want to try a premixed protein drink called Premier Protein shake in the morning.  It is less $$$ and very low sugar.    160 cal  30 g protein  1 g sugar 50% calcium needs

## 2016-09-13 NOTE — Assessment & Plan Note (Addendum)
Suggested by her multiple somatic complaints today with largely normal appearance/exam and recent labs. Resuming remeron starting at 7.5 mg daily at bedtime,  Return in 2 weeks

## 2016-09-24 ENCOUNTER — Ambulatory Visit: Payer: PPO | Admitting: Internal Medicine

## 2016-09-29 ENCOUNTER — Ambulatory Visit: Payer: PPO | Admitting: Cardiovascular Disease

## 2016-10-05 ENCOUNTER — Ambulatory Visit (INDEPENDENT_AMBULATORY_CARE_PROVIDER_SITE_OTHER): Payer: PPO | Admitting: Internal Medicine

## 2016-10-05 ENCOUNTER — Ambulatory Visit (INDEPENDENT_AMBULATORY_CARE_PROVIDER_SITE_OTHER): Payer: PPO | Admitting: Cardiovascular Disease

## 2016-10-05 ENCOUNTER — Encounter: Payer: Self-pay | Admitting: Internal Medicine

## 2016-10-05 VITALS — BP 150/80 | HR 93 | Temp 98.4°F | Resp 16 | Wt 136.0 lb

## 2016-10-05 DIAGNOSIS — R0789 Other chest pain: Secondary | ICD-10-CM

## 2016-10-05 DIAGNOSIS — E871 Hypo-osmolality and hyponatremia: Secondary | ICD-10-CM

## 2016-10-05 DIAGNOSIS — F331 Major depressive disorder, recurrent, moderate: Secondary | ICD-10-CM

## 2016-10-05 DIAGNOSIS — R600 Localized edema: Secondary | ICD-10-CM

## 2016-10-05 DIAGNOSIS — E032 Hypothyroidism due to medicaments and other exogenous substances: Secondary | ICD-10-CM

## 2016-10-05 DIAGNOSIS — E559 Vitamin D deficiency, unspecified: Secondary | ICD-10-CM

## 2016-10-05 MED ORDER — OSELTAMIVIR PHOSPHATE 75 MG PO CAPS: 75.0000 mg | ORAL_CAPSULE | Freq: Every day | ORAL | 0 refills | Status: DC

## 2016-10-05 MED ORDER — FUROSEMIDE 20 MG PO TABS: ORAL_TABLET | ORAL | 1 refills | Status: DC

## 2016-10-05 MED ORDER — OMEPRAZOLE 20 MG PO CPDR: 20.0000 mg | DELAYED_RELEASE_CAPSULE | Freq: Two times a day (BID) | ORAL | 3 refills | Status: DC

## 2016-10-05 NOTE — Patient Instructions (Addendum)
You should add a second Prilosec in the afternoon,  30 minutes before dinner. Until  you see Dr. Fletcher Anon  Continue 7. 5 mg  of remeron at bedtime  We are rechecking   your sodium today   Resume furosmide every other day for fluid retention   TAKE THE TAMIFLU:  ONCE DAILY FOR 10 DAYS IF YOU COME IN CONTACT WITH A SICK PERSON BUT HAVE NO SYMPTOMS YOURSELF  TWICE DAILY FOR 5 DAYS IF YOU FEEL LIKE YOU HAVE GOTTEN THE FLU

## 2016-10-05 NOTE — Progress Notes (Signed)
Subjective:  Patient ID: Kim Santiago, female    DOB: 06-22-1935  Age: 82 y.o. MRN: [id number]  CC: The primary encounter diagnosis was Vitamin D deficiency. Diagnoses of Localized edema, Hyponatremia, Major depressive disorder, recurrent episode, moderate (HCC), Iatrogenic hypothyroidism, and Chest pain, atypical were also pertinent to this visit.  HPI [name] presents for follow up on depression and loss of appetite,  Improving with remeron .  7.5  Mg    Had an ER visit  In mid Dec for  Nocturnal chest pain radiating to jaw .  ER diagnosed reflux after labs and chest x ray were normal.  She is taking  omeprazole in the am fasting . Referred to cardiology but has become annoyed by cardiology run around .  Referred to [facility] after ER visit for anginal episode in middle of night.    Cc:  "Dyspnea" for the past week .  She denies shortness of breath,  just feels the need to take a deep breath every few minutes. 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  Has seen dermatology and rheumatology. placquenil failure. Considering MTX per Jefm Bryant. .   Lab Results  Component Value Date   TSH 1.52 06/08/2016     Outpatient Medications Prior to Visit  Medication Sig Dispense Refill  . acetaminophen (TYLENOL) 650 MG CR tablet Take 650 mg by mouth every 8 (eight) hours as needed for pain.    Marland Kitchen aspirin 81 MG tablet Take 81 mg by mouth daily.    . Calcium Carbonate-Vitamin D (CALCIUM + D) 600-200 MG-UNIT TABS Take 2 tablets by mouth 2 (two) times daily.     . cholecalciferol (VITAMIN D) 1000 UNITS tablet Take 2,000 Units by mouth daily.     Marland Kitchen letrozole (FEMARA) 2.5 MG tablet Take 1 tablet (2.5 mg total) by mouth daily. 90 tablet 1  . levothyroxine (SYNTHROID, LEVOTHROID) 88 MCG tablet Take 1 tablet (88 mcg total) by mouth daily. 90 tablet 4  . losartan (COZAAR) 25 MG tablet Take 1 tablet (25 mg  total) by mouth daily. 90 tablet 4  . metoprolol succinate (TOPROL XL) 25 MG 24 hr tablet Take 1 tablet (25 mg total) by mouth daily. 90 tablet 4  . mirtazapine (REMERON) 7.5 MG tablet Take 1 tablet (7.5 mg total) by mouth at bedtime. Increase after 2 weeks to 2 tablets daily at bedtime 90 tablet 0  . Multiple Vitamins-Minerals (OSTEO COMPLEX PO) Take 1 tablet by mouth daily. Take one by mouth daily    . polyethylene glycol (MIRALAX / GLYCOLAX) packet Take 17 g by mouth daily.    Marland Kitchen tiZANidine (ZANAFLEX) 2 MG tablet Take 1 tablet (2 mg total) by mouth every 6 (six) hours as needed (for jaw pain). 90 tablet 0  . triamcinolone cream (KENALOG) 0.1 % Apply 1 application topically 2 (two) times daily. 30 g 0  . vitamin B-12 (CYANOCOBALAMIN) 1000 MCG tablet Take 1,000 mcg by mouth daily.    . furosemide (LASIX) 20 MG tablet Once daily as needed for fluid retention 90 tablet 1  . omeprazole (PRILOSEC) 20 MG capsule Take 1 capsule (20 mg total) by mouth daily. 90 capsule 4   No facility-administered medications prior to visit.     Review of Systems;  Patient denies headache, fevers, malaise, unintentional weight loss, eye pain, sinus congestion and sinus pain, sore throat,  dysphagia,  hemoptysis , cough, dyspnea, wheezing,, palpitations, orthopnea, edema, abdominal pain, nausea, melena, diarrhea, constipation, flank pain, dysuria, hematuria, urinary  Frequency, nocturia, numbness, tingling, seizures,  Focal weakness, Loss of consciousness,  Tremor, insomnia, depression, anxiety, and suicidal ideation.      Objective:  BP (!) 150/80   Pulse 93   Temp 98.4 F (36.9 C) (Oral)   Resp 16   Wt 136 lb (61.7 kg)   SpO2 97%   BMI 23.34 kg/m   BP Readings from Last 3 Encounters:  10/05/16 (!) 150/80  09/11/16 (!) 146/70  08/25/16 117/64    Wt Readings from Last 3 Encounters:  10/05/16 136 lb (61.7 kg)  09/11/16 132 lb 4 oz (60 kg)  08/25/16 130 lb (59 kg)    General appearance: alert,  cooperative and appears stated age Ears: normal TM's and external ear canals both ears Throat: lips, mucosa, and tongue normal; teeth and gums normal Neck: no adenopathy, no carotid bruit, supple, symmetrical, trachea midline and thyroid not enlarged, symmetric, no tenderness/mass/nodules Back: symmetric, no curvature. ROM normal. No CVA tenderness. Lungs: clear to auscultation bilaterally Heart: regular rate and rhythm, S1, S2 normal, no murmur, click, rub or gallop Abdomen: soft, non-tender; bowel sounds normal; no masses,  no organomegaly Pulses: 2+ and symmetric Skin: Skin color, texture, turgor normal. No rashes or lesions Lymph nodes: Cervical, supraclavicular, and axillary nodes normal.  No results found for: HGBA1C  Lab Results  Component Value Date   CREATININE 0.64 10/05/2016   CREATININE 0.70 08/25/2016   CREATININE 0.72 08/12/2016    Lab Results  Component Value Date   WBC 6.1 08/25/2016   HGB 11.9 (L) 08/25/2016   HCT 34.0 (L) 08/25/2016   PLT 182 08/25/2016   GLUCOSE 94 10/05/2016   CHOL 170 06/08/2016   TRIG 86.0 06/08/2016   HDL 44.20 06/08/2016   LDLDIRECT 117.6 12/27/2012   LDLCALC 108 (H) 06/08/2016   ALT 6 10/05/2016   AST 17 10/05/2016   NA 140 10/05/2016   K 4.6 10/05/2016   CL 105 10/05/2016   CREATININE 0.64 10/05/2016   BUN 12 10/05/2016   CO2 33 (H) 10/05/2016   TSH 1.52 06/08/2016    Dg Chest 2 View  Result Date: 08/25/2016 CLINICAL DATA:  Chest pain and shortness of breath for 2 days. Left breast carcinoma. Sjogren syndrome. EXAM: CHEST  2 VIEW COMPARISON:  12/10/2004 FINDINGS: The heart size and mediastinal contours are within normal limits. Both lungs are clear. No evidence of pleural effusion or pneumothorax. Previous left mastectomy and L1 vertebroplasty noted. IMPRESSION: No active cardiopulmonary disease. Electronically Signed   By: Earle Gell M.D.   On: 08/25/2016 08:10    Assessment & Plan:   Problem List Items Addressed This  Visit    Chest pain, atypical    Occurring at rest , with normal enzymes , EKG and chest x ray.  She had a normal cardiac cath in 2013.  Suspect symptoms  May be GERD, but she has been referred to [facility] for evaluation.  Advised to increase prilosec to twice daily until her cardiology follow up       Hyponatremia    Noted during ER evaluation .  Now resolved.  Lab Results  Component Value Date   NA 140 10/05/2016   K 4.6 10/05/2016   CL 105 10/05/2016   CO2 33 (H) 10/05/2016  '      Relevant Orders   Comprehensive metabolic panel (Completed)   Iatrogenic hypothyroidism  Secondary to total thyroidectomy  for thyroid cancer, which occurred over 5 years ago,   Thyroid function is WNL on current dose.  No current changes needed.   Lab Results  Component Value Date   TSH 1.52 06/08/2016           Major depressive disorder, recurrent episode, moderate (HCC)    With anorexia and insomnia, both improved with low dose remeron.  No changes today       Other Visit Diagnoses    Vitamin D deficiency    -  Primary   Relevant Orders   VITAMIN D 25 Hydroxy (Vit-D Deficiency, Fractures) (Completed)   Localized edema       Relevant Medications   furosemide (LASIX) 20 MG tablet     A total of 25 minutes of face to face time was spent with patient more than half of which was spent in reviewing ER notes, labs and images, counselling about the above mentioned conditions  and coordination of care    I have changed Ms. Ridener's omeprazole. I am also having her start on oseltamivir. Additionally, I am having her maintain her cholecalciferol, Calcium Carbonate-Vitamin D, Multiple Vitamins-Minerals (OSTEO COMPLEX PO), vitamin B-12, aspirin, polyethylene glycol, triamcinolone cream, acetaminophen, tiZANidine, metoprolol succinate, levothyroxine, losartan, letrozole, mirtazapine, and furosemide.  Meds ordered this encounter  Medications  . furosemide (LASIX) 20 MG tablet    Sig: Once daily  as needed for fluid retention    Dispense:  90 tablet    Refill:  1  . omeprazole (PRILOSEC) 20 MG capsule    Sig: Take 1 capsule (20 mg total) by mouth 2 (two) times daily before a meal.    Dispense:  180 capsule    Refill:  3  . oseltamivir (TAMIFLU) 75 MG capsule    Sig: Take 1 capsule (75 mg total) by mouth daily.    Dispense:  10 capsule    Refill:  0    Medications Discontinued During This Encounter  Medication Reason  . furosemide (LASIX) 20 MG tablet Reorder  . omeprazole (PRILOSEC) 20 MG capsule Reorder    Follow-up: Return in about 3 months (around 01/03/2017).   [name], MD

## 2016-10-05 NOTE — Progress Notes (Signed)
Pre visit review using our clinic review tool, if applicable. No additional management support is needed unless otherwise documented below in the visit note. 

## 2016-10-06 LAB — COMPREHENSIVE METABOLIC PANEL
ALT: 6 U/L (ref 0–35)
AST: 17 U/L (ref 0–37)
Albumin: 4 g/dL (ref 3.5–5.2)
Alkaline Phosphatase: 98 U/L (ref 39–117)
BILIRUBIN TOTAL: 0.3 mg/dL (ref 0.2–1.2)
BUN: 12 mg/dL (ref 6–23)
CO2: 33 meq/L — AB (ref 19–32)
CREATININE: 0.64 mg/dL (ref 0.40–1.20)
Calcium: 9.3 mg/dL (ref 8.4–10.5)
Chloride: 105 mEq/L (ref 96–112)
GFR: 94.96 mL/min (ref >60.00)
GLUCOSE: 94 mg/dL (ref 70–99)
Potassium: 4.6 mEq/L (ref 3.5–5.1)
Sodium: 140 mEq/L (ref 135–145)
Total Protein: 6.7 g/dL (ref 6.0–8.3)

## 2016-10-06 LAB — VITAMIN D 25 HYDROXY (VIT D DEFICIENCY, FRACTURES): VITD: 59.87 ng/mL (ref 30.00–100.00)

## 2016-10-07 DIAGNOSIS — R0789 Other chest pain: Secondary | ICD-10-CM

## 2016-10-07 DIAGNOSIS — E871 Hypo-osmolality and hyponatremia: Secondary | ICD-10-CM | POA: Insufficient documentation

## 2016-10-07 DIAGNOSIS — R079 Chest pain, unspecified: Secondary | ICD-10-CM | POA: Insufficient documentation

## 2016-10-07 NOTE — Assessment & Plan Note (Signed)
Occurring at rest , with normal enzymes , EKG and chest x ray.  She had a normal cardiac cath in 2013.  Suspect symptoms  May be GERD, but she has been referred to [facility] for evaluation.  Advised to increase prilosec to twice daily until her cardiology follow up

## 2016-10-07 NOTE — Assessment & Plan Note (Signed)
Noted during ER evaluation .  Now resolved.  Lab Results  Component Value Date   NA 140 10/05/2016   K 4.6 10/05/2016   CL 105 10/05/2016   CO2 33 (H) 10/05/2016  '

## 2016-10-07 NOTE — Assessment & Plan Note (Signed)
Secondary to total thyroidectomy  for thyroid cancer, which occurred over 5 years ago,   Thyroid function is WNL on current dose.  No current changes needed.   Lab Results  Component Value Date   TSH 1.52 06/08/2016      

## 2016-10-07 NOTE — Assessment & Plan Note (Signed)
With anorexia and insomnia, both improved with low dose remeron.  No changes today

## 2016-10-19 DIAGNOSIS — M546 Pain in thoracic spine: Secondary | ICD-10-CM | POA: Diagnosis not present

## 2016-10-19 DIAGNOSIS — M9908 Segmental and somatic dysfunction of rib cage: Secondary | ICD-10-CM | POA: Diagnosis not present

## 2016-10-19 DIAGNOSIS — M9907 Segmental and somatic dysfunction of upper extremity: Secondary | ICD-10-CM | POA: Diagnosis not present

## 2016-10-19 DIAGNOSIS — M9901 Segmental and somatic dysfunction of cervical region: Secondary | ICD-10-CM | POA: Diagnosis not present

## 2016-10-19 DIAGNOSIS — M9902 Segmental and somatic dysfunction of thoracic region: Secondary | ICD-10-CM | POA: Diagnosis not present

## 2016-10-23 DIAGNOSIS — M546 Pain in thoracic spine: Secondary | ICD-10-CM | POA: Diagnosis not present

## 2016-10-23 DIAGNOSIS — M9902 Segmental and somatic dysfunction of thoracic region: Secondary | ICD-10-CM | POA: Diagnosis not present

## 2016-10-23 DIAGNOSIS — M9907 Segmental and somatic dysfunction of upper extremity: Secondary | ICD-10-CM | POA: Diagnosis not present

## 2016-10-23 DIAGNOSIS — M9901 Segmental and somatic dysfunction of cervical region: Secondary | ICD-10-CM | POA: Diagnosis not present

## 2016-10-23 DIAGNOSIS — M9908 Segmental and somatic dysfunction of rib cage: Secondary | ICD-10-CM | POA: Diagnosis not present

## 2016-10-25 ENCOUNTER — Encounter: Payer: Self-pay | Admitting: Hematology and Oncology

## 2016-10-26 ENCOUNTER — Ambulatory Visit: Payer: PPO | Admitting: Cardiovascular Disease

## 2016-10-26 ENCOUNTER — Encounter: Payer: PPO | Admitting: Physician Assistant

## 2016-10-26 NOTE — Progress Notes (Signed)
Pt arrived for appt with Christell Faith, PA today. Pt stated she didn't know why she was being seen due to everything checking out ok at the ED in [place].2017. I checked with Thurmond Butts, and he stated he would be glad to see her to do a further work-up. Pt declined. Thurmond Butts also mentioned an echo that indicated an Ef that cannot be found in [place]. The pt denied an echo being done anywhere other than [facility]. Pt stated she thinks she is fine at this time. Her daughter with her also agreed. I advised pt to please give our office a call if she needs anything.

## 2016-10-27 NOTE — Progress Notes (Signed)
This encounter was created in error - please disregard.

## 2016-10-30 DIAGNOSIS — M9908 Segmental and somatic dysfunction of rib cage: Secondary | ICD-10-CM | POA: Diagnosis not present

## 2016-10-30 DIAGNOSIS — M9902 Segmental and somatic dysfunction of thoracic region: Secondary | ICD-10-CM | POA: Diagnosis not present

## 2016-10-30 DIAGNOSIS — M9903 Segmental and somatic dysfunction of lumbar region: Secondary | ICD-10-CM | POA: Diagnosis not present

## 2016-11-05 ENCOUNTER — Other Ambulatory Visit: Payer: Self-pay

## 2016-11-05 DIAGNOSIS — Z1231 Encounter for screening mammogram for malignant neoplasm of breast: Secondary | ICD-10-CM

## 2016-11-06 DIAGNOSIS — M9903 Segmental and somatic dysfunction of lumbar region: Secondary | ICD-10-CM | POA: Diagnosis not present

## 2016-11-06 DIAGNOSIS — M9902 Segmental and somatic dysfunction of thoracic region: Secondary | ICD-10-CM | POA: Diagnosis not present

## 2016-11-11 ENCOUNTER — Inpatient Hospital Stay: Payer: PPO | Attending: Hematology and Oncology

## 2016-11-11 ENCOUNTER — Encounter: Payer: Self-pay | Admitting: Hematology and Oncology

## 2016-11-11 ENCOUNTER — Inpatient Hospital Stay (HOSPITAL_BASED_OUTPATIENT_CLINIC_OR_DEPARTMENT_OTHER): Payer: PPO | Admitting: Hematology and Oncology

## 2016-11-11 VITALS — BP 130/74 | HR 88 | Temp 98.0°F | Resp 18 | Wt 139.1 lb

## 2016-11-11 DIAGNOSIS — I129 Hypertensive chronic kidney disease with stage 1 through stage 4 chronic kidney disease, or unspecified chronic kidney disease: Secondary | ICD-10-CM

## 2016-11-11 DIAGNOSIS — M329 Systemic lupus erythematosus, unspecified: Secondary | ICD-10-CM

## 2016-11-11 DIAGNOSIS — Z800 Family history of malignant neoplasm of digestive organs: Secondary | ICD-10-CM | POA: Insufficient documentation

## 2016-11-11 DIAGNOSIS — I252 Old myocardial infarction: Secondary | ICD-10-CM

## 2016-11-11 DIAGNOSIS — Z79899 Other long term (current) drug therapy: Secondary | ICD-10-CM

## 2016-11-11 DIAGNOSIS — N189 Chronic kidney disease, unspecified: Secondary | ICD-10-CM | POA: Insufficient documentation

## 2016-11-11 DIAGNOSIS — Z86010 Personal history of colonic polyps: Secondary | ICD-10-CM

## 2016-11-11 DIAGNOSIS — E039 Hypothyroidism, unspecified: Secondary | ICD-10-CM | POA: Insufficient documentation

## 2016-11-11 DIAGNOSIS — Z7982 Long term (current) use of aspirin: Secondary | ICD-10-CM

## 2016-11-11 DIAGNOSIS — R21 Rash and other nonspecific skin eruption: Secondary | ICD-10-CM | POA: Diagnosis not present

## 2016-11-11 DIAGNOSIS — Z85850 Personal history of malignant neoplasm of thyroid: Secondary | ICD-10-CM

## 2016-11-11 DIAGNOSIS — C50912 Malignant neoplasm of unspecified site of left female breast: Secondary | ICD-10-CM | POA: Diagnosis not present

## 2016-11-11 DIAGNOSIS — Z79811 Long term (current) use of aromatase inhibitors: Secondary | ICD-10-CM | POA: Insufficient documentation

## 2016-11-11 DIAGNOSIS — C773 Secondary and unspecified malignant neoplasm of axilla and upper limb lymph nodes: Secondary | ICD-10-CM | POA: Insufficient documentation

## 2016-11-11 DIAGNOSIS — M3500 Sicca syndrome, unspecified: Secondary | ICD-10-CM

## 2016-11-11 DIAGNOSIS — Z170 Estrogen receptor positive status [ER+]: Secondary | ICD-10-CM

## 2016-11-11 DIAGNOSIS — M1990 Unspecified osteoarthritis, unspecified site: Secondary | ICD-10-CM | POA: Diagnosis not present

## 2016-11-11 DIAGNOSIS — Z880 Allergy status to penicillin: Secondary | ICD-10-CM | POA: Insufficient documentation

## 2016-11-11 DIAGNOSIS — M810 Age-related osteoporosis without current pathological fracture: Secondary | ICD-10-CM

## 2016-11-11 DIAGNOSIS — Z9012 Acquired absence of left breast and nipple: Secondary | ICD-10-CM | POA: Insufficient documentation

## 2016-11-11 LAB — CBC WITH DIFFERENTIAL/PLATELET
Basophils Absolute: 0 10*3/uL (ref 0–0.1)
Basophils Relative: 1 %
Eosinophils Absolute: 0.2 10*3/uL (ref 0–0.7)
Eosinophils Relative: 6 %
HCT: 34.4 % — ABNORMAL LOW (ref 35.0–47.0)
Hemoglobin: 11.6 g/dL — ABNORMAL LOW (ref 12.0–16.0)
Lymphocytes Relative: 28 %
Lymphs Abs: 1.1 10*3/uL (ref 1.0–3.6)
MCH: 32 pg (ref 26.0–34.0)
MCHC: 33.7 g/dL (ref 32.0–36.0)
MCV: 95.1 fL (ref 80.0–100.0)
Monocytes Absolute: 0.5 10*3/uL (ref 0.2–0.9)
Monocytes Relative: 12 %
Neutro Abs: 2.1 10*3/uL (ref 1.4–6.5)
Neutrophils Relative %: 53 %
Platelets: 174 10*3/uL (ref 150–440)
RBC: 3.62 MIL/uL — ABNORMAL LOW (ref 3.80–5.20)
RDW: 14.3 % (ref 11.5–14.5)
WBC: 4.1 10*3/uL (ref 3.6–11.0)

## 2016-11-11 NOTE — Progress Notes (Signed)
Patient here today for follow up regarding breast cancer.  In the last year, patient has been diagnosed with cutaneous lupus. Seeing a dermatologist for this.

## 2016-11-11 NOTE — Progress Notes (Signed)
[facility] day:  11/11/2016   Chief Complaint: Kim Santiago is a 82 y.o. female with stage IIB left breast cancer who is seen for 3 month assessment on Femara.  HPI:  The patient was last seen in the medical oncology clinic on 08/12/2016.  At that time, she was fatigued.  She was taking Femara.  Labs included a normal CBC with diff, CMP, CA27.29.  TSH on 06/08/2016 was 1.52 (normal). Right sided mammogram was scheduled for 11/2016.  Symptomatically, she denies any breast concerns.  She states that her "lupus is driving me crazy".  She saw Dr. Evorn Gong at [facility].  Plan is for methotrexate.    Past Medical History:  Diagnosis Date  . Arthritis   . Breast cancer in female [facility]) November 18, 2015   Left: 3.9 cm tumor, T2, 1/2 sentinel nodes positive for macro metastatic disease, N1, 3 negative nodes in the axillary tail., low Mammoprint score  . Cancer [facility])    thyroid takes levothyroxine  . Chronic kidney disease    UTI  . Genetic screening March 2017.   Mammoprint of left breast cancer: Low risk for recurrence.   . Hypertension   . hypothyroidism    secondary to thyroidectomy for thyroid ca  . Hypothyroidism   . Menopause 40s   natural, hot flashes and mood lability now gone, off prempro 7 months  . Myocardial infarction 2013  . Osteoporosis    Osteopenia  . Rosacea   . Sjoegren syndrome ([place])   . Stress-induced cardiomyopathy September of 2013   EF 35%. Peak troponin was 1.8.    Past Surgical History:  Procedure Laterality Date  . BACK SURGERY    . BREAST BIOPSY Left 10/30/15   positive, done in Dr. Dwyane Luo office  . CARDIAC CATHETERIZATION  05/2012   [facility]. No significant CAD. Ejection fraction of 35% due to stress-induced cardiomyopathy.  . CHOLECYSTECTOMY    . COLONOSCOPY    . DILATION AND CURETTAGE OF UTERUS    . KYPHOSIS SURGERY  Feb 2008   L1, Dr. Mauri Pole  . LUMBAR DISC SURGERY     L4-L5  . SENTINEL NODE  BIOPSY Left 11/18/2015   Procedure: SENTINEL NODE BIOPSY;  Surgeon: Robert Bellow, MD;  Location: [facility] ORS;  Service: General;  Laterality: Left;  . SHOULDER ARTHROSCOPY  2004   Left, Dr. Jefm Bryant  . SIMPLE MASTECTOMY WITH AXILLARY SENTINEL NODE BIOPSY Left 11/18/2015   Procedure: SIMPLE MASTECTOMY;  Surgeon: Robert Bellow, MD;  Location: [facility] ORS;  Service: General;  Laterality: Left;  . SPINE SURGERY     L4-5 diskectomy  . THYROIDECTOMY     Thyroid Cancer  . TONSILLECTOMY    . TUBAL LIGATION      Family History  Problem Relation Age of Onset  . COPD Father   . Cancer Father     esophageal  . Kidney disease Mother   . Heart disease Mother   . Kidney disease Sister   . Cancer Brother 24    colon cancer (both brothers)  . Heart attack Brother 64  . Heart disease Brother     Social History:  reports that she has never smoked. She has never used smokeless tobacco. She reports that she does not drink alcohol or use drugs.  She is from [place], [place].  She was married for 16 years.  Her husband died 58 months ago.  She lives alone in [place].  She has  an adopted daughter and 2 grandchildren.  Her brother died of a MI following a cholecystectomy in 12/2015.  The patient is alone today.  Allergies:  Allergies  Allergen Reactions  . Amoxicillin Rash  . Codeine Nausea And Vomiting  . Naprosyn [Naproxen] Swelling  . Orudis [Ketoprofen] Hives  . Plaquenil [Hydroxychloroquine] Hives  . Sulfathiazole Rash    Current Medications: Current Outpatient Prescriptions  Medication Sig Dispense Refill  . acetaminophen (TYLENOL) 650 MG CR tablet Take 650 mg by mouth every 8 (eight) hours as needed for pain.    Marland Kitchen aspirin 81 MG tablet Take 81 mg by mouth daily.    . Calcium Carbonate-Vitamin D (CALCIUM + D) 600-200 MG-UNIT TABS Take 2 tablets by mouth 2 (two) times daily.     . cholecalciferol (VITAMIN D) 1000 UNITS tablet Take 2,000 Units by mouth daily.     . furosemide (LASIX) 20 MG  tablet Once daily as needed for fluid retention 90 tablet 1  . letrozole (FEMARA) 2.5 MG tablet Take 1 tablet (2.5 mg total) by mouth daily. 90 tablet 1  . levothyroxine (SYNTHROID, LEVOTHROID) 88 MCG tablet Take 1 tablet (88 mcg total) by mouth daily. 90 tablet 4  . losartan (COZAAR) 25 MG tablet Take 1 tablet (25 mg total) by mouth daily. 90 tablet 4  . metoprolol succinate (TOPROL XL) 25 MG 24 hr tablet Take 1 tablet (25 mg total) by mouth daily. 90 tablet 4  . mirtazapine (REMERON) 7.5 MG tablet Take 1 tablet (7.5 mg total) by mouth at bedtime. Increase after 2 weeks to 2 tablets daily at bedtime 90 tablet 0  . Multiple Vitamins-Minerals (OSTEO COMPLEX PO) Take 1 tablet by mouth daily. Take one by mouth daily    . omeprazole (PRILOSEC) 20 MG capsule Take 1 capsule (20 mg total) by mouth 2 (two) times daily before a meal. 180 capsule 3  . polyethylene glycol (MIRALAX / GLYCOLAX) packet Take 17 g by mouth daily.    Marland Kitchen triamcinolone cream (KENALOG) 0.1 % Apply 1 application topically 2 (two) times daily. 30 g 0  . vitamin B-12 (CYANOCOBALAMIN) 1000 MCG tablet Take 1,000 mcg by mouth daily.    Marland Kitchen oseltamivir (TAMIFLU) 75 MG capsule Take 1 capsule (75 mg total) by mouth daily. (Patient not taking: Reported on 11/11/2016) 10 capsule 0  . tiZANidine (ZANAFLEX) 2 MG tablet Take 1 tablet (2 mg total) by mouth every 6 (six) hours as needed (for jaw pain). (Patient not taking: Reported on 11/11/2016) 90 tablet 0   No current facility-administered medications for this visit.     Review of Systems:  GENERAL:  Feels "ok".  No fevers or sweats.  Weight stable. PERFORMANCE STATUS (ECOG):  1 HEENT:  No visual changes, runny nose, sore throat, mouth sores or tenderness. Lungs: No shortness of breath or cough.  No hemoptysis. Cardiac:  No chest pain, palpitations, orthopnea, or PND. GI:  No nausea, vomiting, diarrhea, constipation, melena or hematochezia. GU:  No urgency, frequency, dysuria, or  hematuria. Musculoskeletal:  No back pain.  Little joint pain.  No muscle tenderness. Extremities:  No pain or swelling. Skin:  Hair thinning.  Cutaneous lupus (see HPI). Neuro:  No headache, numbness or weakness, balance or coordination issues. Endocrine:  No diabetes.  Thyroid disease on Synthroid.  Few hot flashes.  No night sweats. Psych:   No mood changes, anxiety or depression.  Pain:  No focal pain. Review of systems:  All other systems reviewed and found to be  negative.  Physical Exam: Blood pressure 130/74, pulse 88, temperature 98 F (36.7 C), temperature source Tympanic, resp. rate 18, weight 139 lb 1.8 oz (63.1 kg). GENERAL:  Well developed, well nourished, woman sitting comfortably in the exam room in no acute distress. MENTAL STATUS:  Alert and oriented to person, place and time. HEAD:  Short gray hair.  Normocephalic, atraumatic, face symmetric, no Cushingoid features. EYES:  Glasses.  Blue eyes.  Pupils equal round and reactive to light and accomodation. No conjunctivitis or scleral icterus. ENT: Oropharynx clear without lesion. Tongue normal. Mucous membranes moist.  RESPIRATORY: Clear to auscultation without rales, wheezes or rhonchi. CARDIOVASCULAR: Regular rate and rhythm without murmur, rub or gallop. No JVD. BREASTS:  Left mastectomy without erythema or nodularity.  Tender right sided fibrocystic changes.  Right breast without masses, skin changes or nipple discharge. ABDOMEN: Soft, non-tender, with active bowel sounds, and no hepatosplenomegaly. SKIN: Extensive rash secondary to lupus.  Some excoriation. EXTREMITIES: No edema, no skin discoloration or tenderness. No palpable cords. LYMPH NODES:  No palpable cervical, supraclavicular, axillary or inguinal adenopathy  NEUROLOGICAL: Unremarkable. PSYCH: Appropriate.  Labs: Appointment on 11/11/2016  Component Date Value Ref Range Status  . WBC 11/11/2016 4.1  3.6 - 11.0 K/uL Final  . RBC 11/11/2016 3.62*  3.80 - 5.20 MIL/uL Final  . Hemoglobin 11/11/2016 11.6* 12.0 - 16.0 g/dL Final  . HCT 11/11/2016 34.4* 35.0 - 47.0 % Final  . MCV 11/11/2016 95.1  80.0 - 100.0 fL Final  . MCH 11/11/2016 32.0  26.0 - 34.0 pg Final  . MCHC 11/11/2016 33.7  32.0 - 36.0 g/dL Final  . RDW 11/11/2016 14.3  11.5 - 14.5 % Final  . Platelets 11/11/2016 174  150 - 440 K/uL Final  . Neutrophils Relative % 11/11/2016 53  % Final  . Neutro Abs 11/11/2016 2.1  1.4 - 6.5 K/uL Final  . Lymphocytes Relative 11/11/2016 28  % Final  . Lymphs Abs 11/11/2016 1.1  1.0 - 3.6 K/uL Final  . Monocytes Relative 11/11/2016 12  % Final  . Monocytes Absolute 11/11/2016 0.5  0.2 - 0.9 K/uL Final  . Eosinophils Relative 11/11/2016 6  % Final  . Eosinophils Absolute 11/11/2016 0.2  0 - 0.7 K/uL Final  . Basophils Relative 11/11/2016 1  % Final  . Basophils Absolute 11/11/2016 0.0  0 - 0.1 K/uL Final    No visits with results within 3 Day(s) from this visit. Latest known visit with results is:  Admission on 11/18/2015, Discharged on 11/18/2015  Component Date Value Ref Range Status  . SURGICAL PATHOLOGY 11/18/2015    Final-Edited                   Value:Surgical Pathology THIS IS AN ADDENDUM REPORT CASE: [id number] PATIENT: [facility] Surgical Pathology Report Addendum  Reason for Addendum #1:  Molecular studies  SPECIMEN SUBMITTED: A. Breast, left B. Sentinel lymph node #1  #2  CLINICAL HISTORY: None provided  PRE-OPERATIVE DIAGNOSIS: Left breast cancer  POST-OPERATIVE DIAGNOSIS: Same as pre-op     DIAGNOSIS: A. LEFT BREAST; TOTAL MASTECTOMY: - SOLITARY INVASIVE MAMMARY CARCINOMA OF NO SPECIAL TYPE, 3.9 CM. - DUCTAL CARCINOMA IN SITU. - LYMPH VASCULAR AND DERMAL LYMPHATIC INVASION IDENTIFIED. - THE SURGICAL MARGINS ARE NEGATIVE. - MICROCALCIFICATIONS IN BENIGN AND MALIGNANT TISSUE. - THREE BENIGN AXILLARY LYMPH NODES (0/3). - SEE SUMMARY BELOW.  B. SENTINEL LYMPH NODE #1 AND 2; EXCISION: - ONE OF  TWO LYMPH NODES POSITIVE FOR MACRO METASTASES (1/2).  INVASIVE CARCINOMA OF THE BREAST: Complete Excision Specimens InvolvedA: Breast, left B: Sentinel lymph nod                         e #1  #2  SPECIMEN Procedure:     Total mastectomy (including nipple and skin) Lymph Node Sampling:     Sentinel lymph node(s) Specimen Laterality:     Left TUMOR Presence of Invasive Carcinoma:    Histologic Type: Invasive mammary carcinoma of no special type (ductal, not otherwise specified) Histologic Grade:   Glandular (acinar) / tubular differentiation: Score 3 (< 10% of tumor area forming glandular / tubular structures) Nuclear pleomorphism: Score 2 (cells larger than normal with open vesicular nuclei, visible nucleoli, and moderate variability in both size and shape) Mitotic rate:  Score 1 (<=3 mitoses per mm2) Overall grade: Grade 2 (scores of 6 or 7) Ductal Carcinoma In Situ (DCIS):   DCIS is present Negative for extensive intraductal component (EIC) Tumor Size / Focality:   Tumor size: size of largest invasive carcinoma: Greatest dimension of largest focus of invasion > 1 mm: 25m Tumor focality:     Single focus of invasive carcinoma Tumor Extent Macr                         oscopic and Microscopic Extent of Tumor Skin:     Skin invasion: Invasive carcinoma directly invades into the dermis or epidermis without skin ulceration (this does not change the T stage) Skin satellite foci:     Negative for satellite foci Nipple DCIS:   DCIS does not involve the nipple epidermis Skeletal muscle:    Specimen does not contain skeletal muscle Accessory Tumor Findings Lymph-Vascular Invasion: Present Treatment Effect: Response to Presurgical (Neoadjuvant) Therapy: No known presurgical therapy MARGINS Invasive Carcinoma: Margins negative for invasive carcinoma Distance from closest margin: Distance is > 10 mm Closest Negative Margin(s):   Posterior Ductal Carcinoma In Situ (DCIS):   Margins  negative for DCIS (DCIS present in specimen) Distance of DCIS from closest margin:   Distance is > 10 mm Closest Negative Margin(s):   Posterior LYMPH NODES Regional Lymph Nodes:    Number of Sentinel Nodes Examined: Specify number 2 Number of lymph node(s) exam                         ined:  Number 5 Lymph node involvement:  Number of lymph nodes with macrometastases (> 2 mm): Number 1 Number of lymph nodes with micrometastases: Number 0 Number of lymph nodes with isolated tumor cells (<= 0.2 mm and <= 200 cells):   Number 0 STAGE (pTNM) TNM Descriptors:    Not applicable n/a Primary Tumor (Invasive Carcinoma) (pT):     pT2:  tumor > 20 mm but <= 50 mm in greatest dimension Regional lymph nodes (pN) (choose a category based on lymph nodes received with the specimen;  immunohistochemistry and / or molecular studies are not required) Category (pN): pN1a: metastases in 1 to 3 axillary lymph nodes, at least 1 metastasis greater than 2 mm Distant Metastasis (pM): Not applicable - pM cannot be determined from the submitted specimen(s) Comment(s): Invasive carcinoma is identified in tissue between the two masses. The tumor is entirely submitted and is present in 13 slides.   GROSS DESCRIPTION: A. Labeled: Left breast  Time in fixative: 3:10  PM on 11/18/2015  Cold ischemic time: 40 minutes  Total fixation time: 6 1/2 Hours  Type of mastectomy: Simple  Laterality: Left  Weight of specimen: 707 grams  Size of specimen: 23.5 x 20.5 x 3.9 cm  Orientation of specimen: The letters M and L are written in surgical marker on the skin surface to designate the medial and lateral aspects specimen. Anterior half specimen inked blue, posterior specimen inked black.  Skin ellipse dimensions  description: 15.5 x 11.2 cm, unremarkable  Nipple/ areola: Nipple 1 cm in diameter, flattened. Areola 4.3 cm in diameter, unremarkable  Axillary tail:  Absent  Biopsy site(s): 2 discrete biopsy sites identified  Number of discrete masses: 2  Location of mass(es): One mass is located approximately at the 6:00 position behind the nipple areolar complex, and the other is located in the approximate 1:00 position in the upper outer quadrant.  Distance between masses: Approximately 1.8 cm  Size of mass(es)/biopsy site(s):                          First mass approximately 1 x 0.9 x 0.5 cm, and second mass approximately 1 x 0.7 x 0.8 cm  Description of mass(es)/biopsy site(s): The first mass is surrounded by a large amount of fibrous tissue measures approximately 2.5 x 2.5 x 1.5 cm. Both the first and second masses are extremely poorly defined, stellate, pale tan to white.  Margins: The first mass is grossly located 3.5 cm from the deep margin and the second mass is grossly located 3 cm from the deep margin  Gross involvement of skin/fascia/muscle by tumor: Absent  Description of remaining breast: The rest the breast tissue is predominantly fatty with interspersed areas of dense white fibrous tissue  Lymph nodes: On dissection 3 lymph node candidates identified from 0.5 cm to 1.2 x 0.8 x 0.5 cm.  Block Summary: 1-skin 2-nipple 3-deep margin of resection 4-9 entire first mass including biopsy cavity and adjacent fibrous tissue at the left breast mass 10-12-entire second mass 13-16-breast tissue between                          first and second mass 17-representative section upper inner quadrant 18-representative section lower inner quadrant 19-representative section upper outer quadrant 20-representative section lower outer quadrant 21- 2 lymph node candidates submitted on 11/20/15 22-1 sectioned lymph node candidate submitted on 11/20/15  B. Labeled: Sentinel node #1 and 2  Tissue fragment(s): 2  Size: 0.4 x 0.3 x 0.2 cm and 1.2 x 0.6 x 0.6 cm  Description: 2 pink red lymph node candidates covered by adipose  tissue, smaller inked blue  Entirely submitted in one cassette(s).         Final Diagnosis performed by Delorse Lek, MD.  Electronically signed 11/21/2015 2:59:43PM   The electronic signature indicates that the named Attending Pathologist has evaluated the specimen  Technical component performed at [facility], [address] [place], [place] [address] Lab: [phone] Dir: Darrick Penna. Evette Doffing, MD  Professional component performed at [facility], [facility],                          [place], [place], [place] [address] Lab: [phone] Dir: Dellia Nims. Reuel Derby, MD   ADDENDUM: At Dr. Kem Parkinson request, FFPE tumor tissue (block A8) was submitted to [place], [place], for MammaPrint testing, a 70-gene expression profile. The  assay result is reported as Low Risk, +0.192. Please see separate report, requisition number [id number], in [place].    Addendum #1 performed by Bryan Lemma, MD.  Electronically signed 12/24/2015 12:25:01PM    Technical component performed at [facility], [address] [place], [place] [address] Lab: [phone] Dir: Darrick Penna. Evette Doffing, MD  Professional component performed at [facility], [facility], [place], [place], [place] [address] Lab: [phone] Dir: Dellia Nims. Rubinas, MD      Assessment:  Kim Santiago is a 82 y.o. female with stage IIB left breast cancer s/p mastectomy on 11/18/2015.  Pathology revealed a 3.9 cm grade II invasive mammary carcinoma.  There was ductal carcinoma in situ (DCIS).  There was lymphvascular and dermal lymphatic invasion.  Margins were negative.  One of 2 sentinel lymph nodes were positive for macrometastasis.  In addition, there were 3 benign axillary lymph nodes (total 5 lymph nodes).  Tumor was estrogen receptor positive (100%), progesterone receptor positive (70%), and Her2/neu negative.  Pathologic stage was T2N1a.   Bilateral mammogram and ultrasound on 11/11/2015 revealed  a 3 cm left breast mass at the 6 o'clock position (1 cm from the nipple) and a 1.6 cm irregular hypoechoic left breast mass at 1 o'clock position (5 cm from the nipple) with overlying skin thickening.   MammaPrint on 12/18/2015 revealed a low risk of recurrence with a 5% risk of distant recurrence of 5 years and a 10% recurrence at 10 years.  She declined radiation.  She began Femara on 01/09/2016.  She has a little joint pain and a few hot flashes.  CA27.29 has been followed: 30.4 on 11/15/2015, 32.5 on 05/13/2016, 19.0 on 08/12/2016, and 40.8 on 11/11/2016.  Bone density study on 12/05/2015 revealed osteopenia with a T-score of -1.5 in the left femoral neck.  She is on calcium and vitamin D.  She has a history of a myocardial infarction in 06/2012.  Echocardiogram revealed an EF of 35%.  Follow-up echo on 09/21/2015 revealed an EF of 55-60%.  Patient is unaware of a history of heart failure.   She has a history of thyroid carcinoma 40 years ago treated with I-131.   She has a mild normocytic anemia.  Diet is good.  Colonoscopy on 07/25/2013 revealed 3 small polyps (27m proximal descending colon, 3 mm splenic flexure, 1 mm ascending colon).  She denies any melena or hematochezia.  TSH was normal on 12/26/2015.  She has cutaneous lupus and Sjogren's.  She had a reaction to Plaquenil.  She completed an extended steroid taper.  She is scheduled to begin methotrexate.  Symptomatically, she denies any breast concerns.  Exam reveals a stable breast exam and a flare of cutaneous lupus.  Plan: 1.  Labs today:  CBC with diff, CMP, CA27.29. 2.  Continue Femara. 3.  Anticipate right sided mammogram on 11/18/2016 with subsequent follow-up with Dr. BBary Castilla(assist patient with appointments). 4.  RTC in 4 months for MD assessment and labs (CBC with diff, CMP, CA27.29).  Addendum:  CA27.29 was elevated at 40.8 (0-38.6).  Patient was notified.  Etiology may be secondary to current lupus flare.  Recheck in 1  month.   MLequita Asal MD  11/11/2016 , 10:43 AM

## 2016-11-12 ENCOUNTER — Telehealth: Payer: Self-pay | Admitting: *Deleted

## 2016-11-12 LAB — CANCER ANTIGEN 27.29: CA 27.29: 40.8 U/mL — ABNORMAL HIGH (ref 0.0–38.6)

## 2016-11-12 NOTE — Telephone Encounter (Signed)
Patient informed of ca27-29 results and need for recheck in 1 month, appt given to patient, voiced understanding.

## 2016-11-17 ENCOUNTER — Telehealth: Payer: Self-pay | Admitting: *Deleted

## 2016-11-17 NOTE — Telephone Encounter (Signed)
Patient called regarding her mammogram, her last one was 11/11/15 and she is in recalls for may 2018 but she wants to go ahead and have that scheduled this month.

## 2016-11-18 ENCOUNTER — Other Ambulatory Visit: Payer: Self-pay | Admitting: General Surgery

## 2016-11-18 ENCOUNTER — Ambulatory Visit
Admission: RE | Admit: 2016-11-18 | Discharge: 2016-11-18 | Disposition: A | Discharge status: Home / Self Care | Payer: PPO | Source: Ambulatory Visit | Attending: General Surgery | Admitting: General Surgery

## 2016-11-18 DIAGNOSIS — Z1231 Encounter for screening mammogram for malignant neoplasm of breast: Secondary | ICD-10-CM

## 2016-11-18 HISTORY — DX: Malignant neoplasm of unspecified site of unspecified female breast: C50.919

## 2016-11-18 NOTE — Telephone Encounter (Signed)
Spoke with the patient and she will call to schedule her mammogram and call us back to set up her follow up.

## 2016-11-19 DIAGNOSIS — M9903 Segmental and somatic dysfunction of lumbar region: Secondary | ICD-10-CM | POA: Diagnosis not present

## 2016-11-19 DIAGNOSIS — M9902 Segmental and somatic dysfunction of thoracic region: Secondary | ICD-10-CM | POA: Diagnosis not present

## 2016-11-20 ENCOUNTER — Other Ambulatory Visit: Payer: Self-pay | Admitting: Internal Medicine

## 2016-11-20 MED ORDER — MIRTAZAPINE 15 MG PO TABS: 7.5000 mg | ORAL_TABLET | Freq: Every day | ORAL | 1 refills | Status: DC

## 2016-11-20 NOTE — Telephone Encounter (Signed)
Pt called and is requesting a refill on her mirtazapine (REMERON) 7.5 MG tablet. Pt would like to be call when completed, thank you!  [place], [place]  Call pt @ [phone]

## 2016-11-20 NOTE — Telephone Encounter (Signed)
Refilled on 09/11/2016 Last OV: 10/05/2016 Next OV: 01/08/2017

## 2016-11-20 NOTE — Telephone Encounter (Signed)
Refill sent to mail order. I assume she is taking 15 mg  Now since that what the directions were for;   she can cut them in half if that's not the case

## 2016-11-26 ENCOUNTER — Other Ambulatory Visit: Payer: Self-pay

## 2016-11-26 MED ORDER — MIRTAZAPINE 15 MG PO TABS: 15.0000 mg | ORAL_TABLET | Freq: Every day | ORAL | 1 refills | Status: DC

## 2016-11-26 NOTE — Telephone Encounter (Signed)
remeron dose changed to 15 mg nightly and refill sent to mail order

## 2016-11-26 NOTE — Telephone Encounter (Signed)
Spoke with pt about how she takes her remeron and she stated that she is taking one tablet daily in the evening. Pt stated that that she is doing very well taking that and she doesn't think that she needs to take the two tablets. [facility] mail [facility] needs a new rx sent in for a 90 day supply.

## 2016-11-27 DIAGNOSIS — L930 Discoid lupus erythematosus: Secondary | ICD-10-CM | POA: Diagnosis not present

## 2016-11-30 ENCOUNTER — Ambulatory Visit: Payer: PPO | Admitting: General Surgery

## 2016-12-02 ENCOUNTER — Encounter: Payer: Self-pay | Admitting: General Surgery

## 2016-12-02 ENCOUNTER — Ambulatory Visit (INDEPENDENT_AMBULATORY_CARE_PROVIDER_SITE_OTHER): Payer: PPO | Admitting: General Surgery

## 2016-12-02 VITALS — BP 148/78 | HR 84 | Resp 16 | Ht 64.0 in | Wt 140.0 lb

## 2016-12-02 DIAGNOSIS — Z170 Estrogen receptor positive status [ER+]: Secondary | ICD-10-CM

## 2016-12-02 DIAGNOSIS — C50112 Malignant neoplasm of central portion of left female breast: Secondary | ICD-10-CM

## 2016-12-02 NOTE — Patient Instructions (Addendum)
The patient is aware to call back for any questions or concerns.   Patient to return in one right breast screening mammogram.

## 2016-12-02 NOTE — Progress Notes (Addendum)
Patient ID: Kim Santiago, female   DOB: 11-Jul-1935, 82 y.o.   MRN: [id number]  Chief Complaint  Patient presents with  . Follow-up    right breast mammogram    HPI Kim Santiago is a 82 y.o. female who presents for a breast evaluation. The most recent right breast mammogram was done on 11/18/2016.  Patient does perform regular self breast checks and gets regular mammograms done.  Patient is having trouble with her lupus.   HPI  Past Medical History:  Diagnosis Date  . Arthritis   . Breast cancer ([place]) 2017   left mastectomy done 11/2015  . Breast cancer in female [facility]) 11/18/2015   Left: 3.9 cm tumor, T2, 1/2 sentinel nodes positive for macro metastatic disease, N1, 3 negative nodes in the axillary tail, ER+,PR+, Her 2 neu, low Mammoprint score  . Cancer [facility])    thyroid takes levothyroxine  . Chronic kidney disease    UTI  . Genetic screening March 2017.   Mammoprint of left breast cancer: Low risk for recurrence.   . Hypertension   . hypothyroidism    secondary to thyroidectomy for thyroid ca  . Hypothyroidism   . Lupus   . Menopause 40s   natural, hot flashes and mood lability now gone, off prempro 7 months  . Myocardial infarction ([place]) 2013  . Osteoporosis    Osteopenia  . Rosacea   . Sjoegren syndrome ([place])   . Stress-induced cardiomyopathy September of 2013   EF 35%. Peak troponin was 1.8.    Past Surgical History:  Procedure Laterality Date  . BACK SURGERY    . BREAST BIOPSY Left 10/30/15   positive, done in Dr. Dwyane Luo office  . CARDIAC CATHETERIZATION  05/2012   [facility]. No significant CAD. Ejection fraction of 35% due to stress-induced cardiomyopathy.  . CHOLECYSTECTOMY    . COLONOSCOPY    . DILATION AND CURETTAGE OF UTERUS    . KYPHOSIS SURGERY  Feb 2008   L1, Dr. Mauri Pole  . LUMBAR DISC SURGERY     L4-L5  . MASTECTOMY Left 11/18/2015   positive  . SENTINEL NODE BIOPSY Left 11/18/2015   Procedure: SENTINEL NODE BIOPSY;  Surgeon: Robert Bellow,  MD;  Location: [facility] ORS;  Service: General;  Laterality: Left;  . SHOULDER ARTHROSCOPY  2004   Left, Dr. Jefm Bryant  . SIMPLE MASTECTOMY WITH AXILLARY SENTINEL NODE BIOPSY Left 11/18/2015   Procedure: SIMPLE MASTECTOMY;  Surgeon: Robert Bellow, MD;  Location: [facility] ORS;  Service: General;  Laterality: Left;  . SPINE SURGERY     L4-5 diskectomy  . THYROIDECTOMY     Thyroid Cancer  . TONSILLECTOMY    . TUBAL LIGATION      Family History  Problem Relation Age of Onset  . COPD Father   . Cancer Father     esophageal  . Kidney disease Mother   . Heart disease Mother   . Kidney disease Sister   . Cancer Brother 41    colon cancer (both brothers)  . Heart attack Brother 11  . Heart disease Brother   . Breast cancer Neg Hx     Social History Social History  Substance Use Topics  . Smoking status: Never Smoker  . Smokeless tobacco: Never Used  . Alcohol use No    Allergies  Allergen Reactions  . Amoxicillin Rash  . Codeine Nausea And Vomiting  . Naprosyn [Naproxen] Swelling  . Orudis [Ketoprofen] Hives  . Plaquenil [Hydroxychloroquine] Hives  .  Sulfathiazole Rash    Current Outpatient Prescriptions  Medication Sig Dispense Refill  . acetaminophen (TYLENOL) 650 MG CR tablet Take 650 mg by mouth every 8 (eight) hours as needed for pain.    Marland Kitchen aspirin 81 MG tablet Take 81 mg by mouth daily.    . Calcium Carbonate-Vitamin D (CALCIUM + D) 600-200 MG-UNIT TABS Take 2 tablets by mouth 2 (two) times daily.     . cholecalciferol (VITAMIN D) 1000 UNITS tablet Take 2,000 Units by mouth daily.     . furosemide (LASIX) 20 MG tablet Once daily as needed for fluid retention 90 tablet 1  . letrozole (FEMARA) 2.5 MG tablet Take 1 tablet (2.5 mg total) by mouth daily. 90 tablet 1  . levothyroxine (SYNTHROID, LEVOTHROID) 88 MCG tablet Take 1 tablet (88 mcg total) by mouth daily. 90 tablet 4  . losartan (COZAAR) 25 MG tablet Take 1 tablet (25 mg total) by mouth daily. 90 tablet 4  .  metoprolol succinate (TOPROL XL) 25 MG 24 hr tablet Take 1 tablet (25 mg total) by mouth daily. 90 tablet 4  . mirtazapine (REMERON) 15 MG tablet Take 1 tablet (15 mg total) by mouth at bedtime. 90 tablet 1  . Multiple Vitamins-Minerals (OSTEO COMPLEX PO) Take 1 tablet by mouth daily. Take one by mouth daily    . omeprazole (PRILOSEC) 20 MG capsule Take 1 capsule (20 mg total) by mouth 2 (two) times daily before a meal. 180 capsule 3  . polyethylene glycol (MIRALAX / GLYCOLAX) packet Take 17 g by mouth daily.    Marland Kitchen tiZANidine (ZANAFLEX) 2 MG tablet Take 1 tablet (2 mg total) by mouth every 6 (six) hours as needed (for jaw pain). 90 tablet 0  . triamcinolone cream (KENALOG) 0.1 % Apply 1 application topically 2 (two) times daily. 30 g 0  . vitamin B-12 (CYANOCOBALAMIN) 1000 MCG tablet Take 1,000 mcg by mouth daily.     No current facility-administered medications for this visit.     Review of Systems Review of Systems  Constitutional: Negative.   Respiratory: Negative.   Cardiovascular: Negative.     Blood pressure (!) 148/78, pulse 84, resp. rate 16, height 5\' 4"  (1.626 m), weight 140 lb (63.5 kg).  Physical Exam Physical Exam  Constitutional: She is oriented to person, place, and time. She appears well-developed and well-nourished.  Eyes: Conjunctivae are normal. No scleral icterus.  Neck: Neck supple.  Cardiovascular: Normal rate, regular rhythm and normal heart sounds.   Pulmonary/Chest: Effort normal and breath sounds normal.  Neurological: She is alert and oriented to person, place, and time.  Skin: Skin is warm and dry.    Data Reviewed CA 27-29 has been trending erratically: Last for values 40.8, 19, 32.5, 30.4.  Medical oncology notes of 11/11/2016 reviewed.  11/19/2016 right breast screening mammogram reviewed. No interval change. BI-RADS-1.  Bone density of 12/05/2015 showed osteopenia.  Assessment    No evidence of local/regional recurrence.  No evidence of  lymphedema.    Plan    Follow-up as previously scheduled with Dr. Mike Gip for reassessment of elevated CA 27-29.    Patient to return in one year with a right breast screening  mammogram.  This information has been scribed by Gaspar Cola CMA.    Robert Bellow 12/29/2016, 8:52 PM

## 2016-12-05 IMAGING — MG MM DIGITAL DIAGNOSTIC BILAT W/ TOMO W/ CAD
8 of 24 series · 8 of 40 positions shown · non-contrast
Comparison: 01/15/2015, 01/10/2013, additional prior studies dating
back to 06/09/2007

CLINICAL DATA: 80-year-old female with punch biopsy of the left
inferior areola by Dr. Muneyuki on 10/30/2015. Biopsy demonstrated
infiltrating carcinoma consistent with a primary breast carcinoma.
The patient reports that she recently noted a lump in the left
areolar region for which she was referred to Dr. Muneyuki.

EXAM:
DIGITAL DIAGNOSTIC BILATERAL MAMMOGRAM WITH 3D TOMOSYNTHESIS WITH
CAD
ULTRASOUND LEFT BREAST

[L MLO synth-2D]
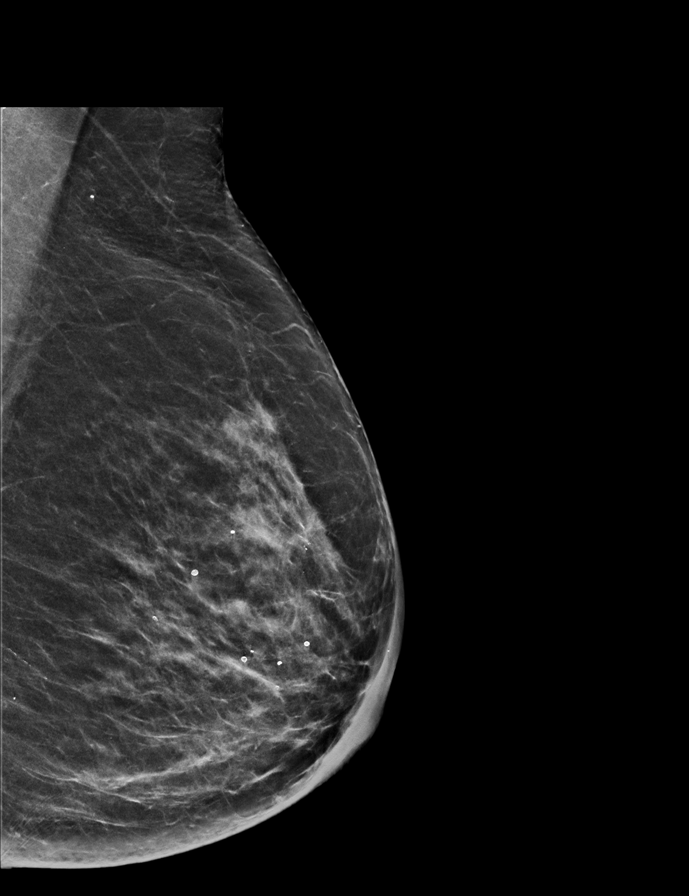

[R CC]
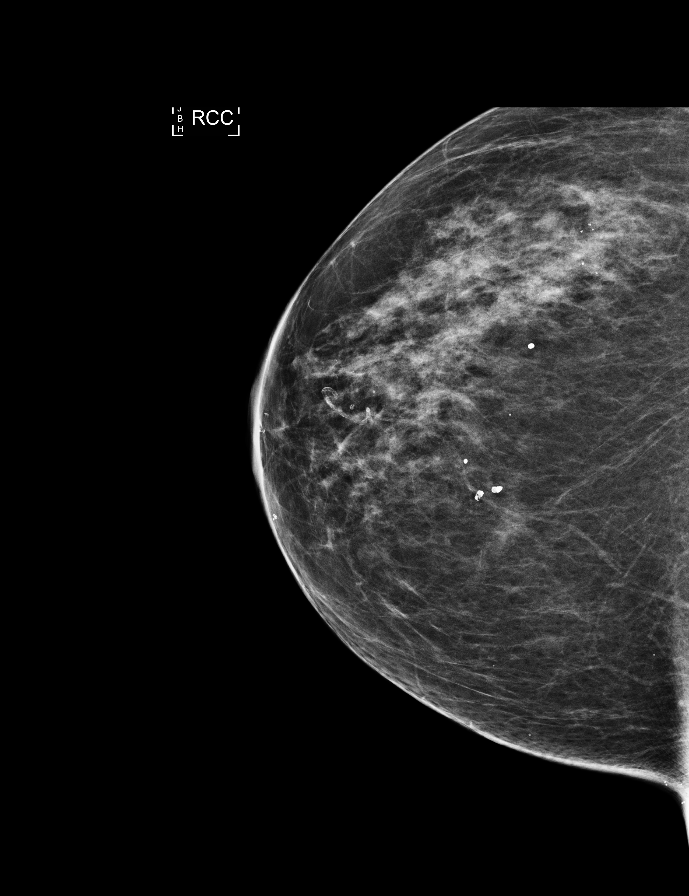

[R MLO]
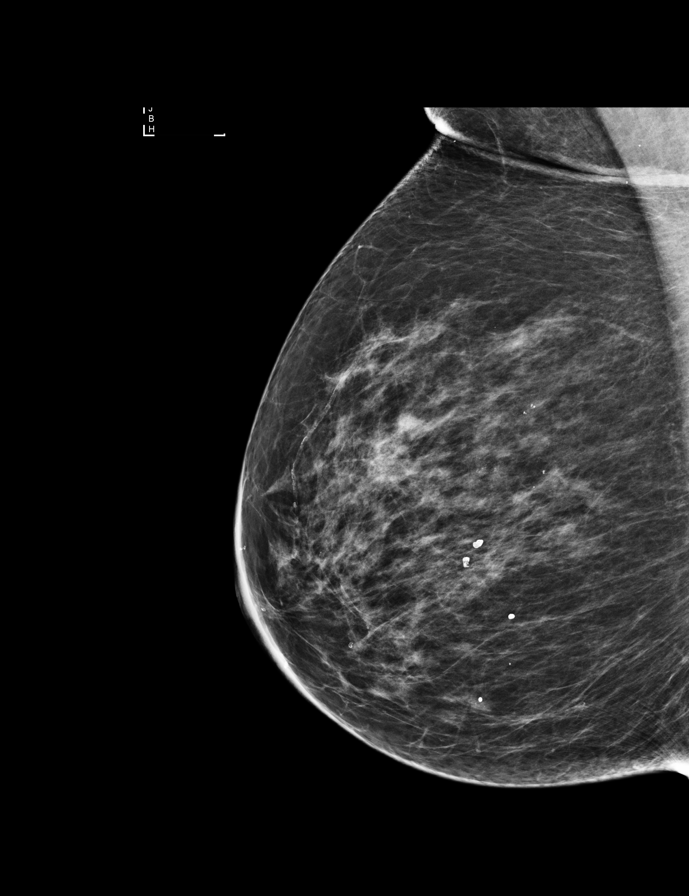

[L CC synth-2D]
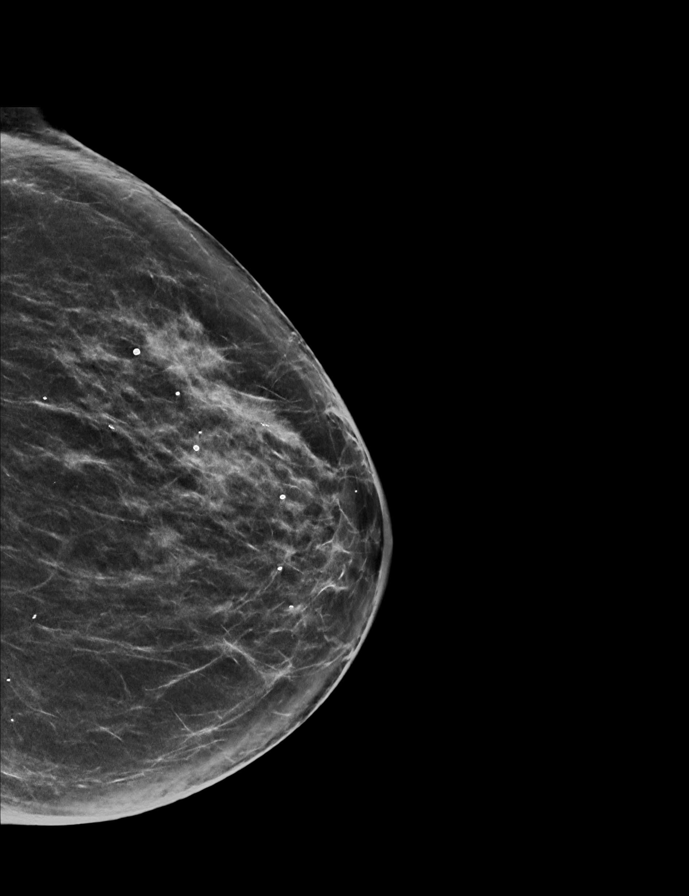

[L CC]
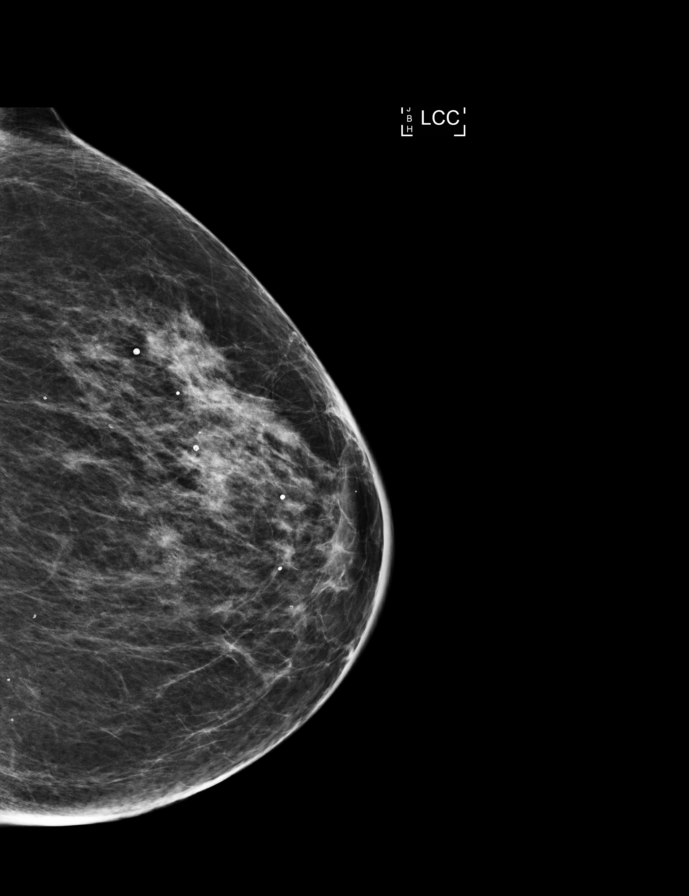

[R MLO synth-2D]
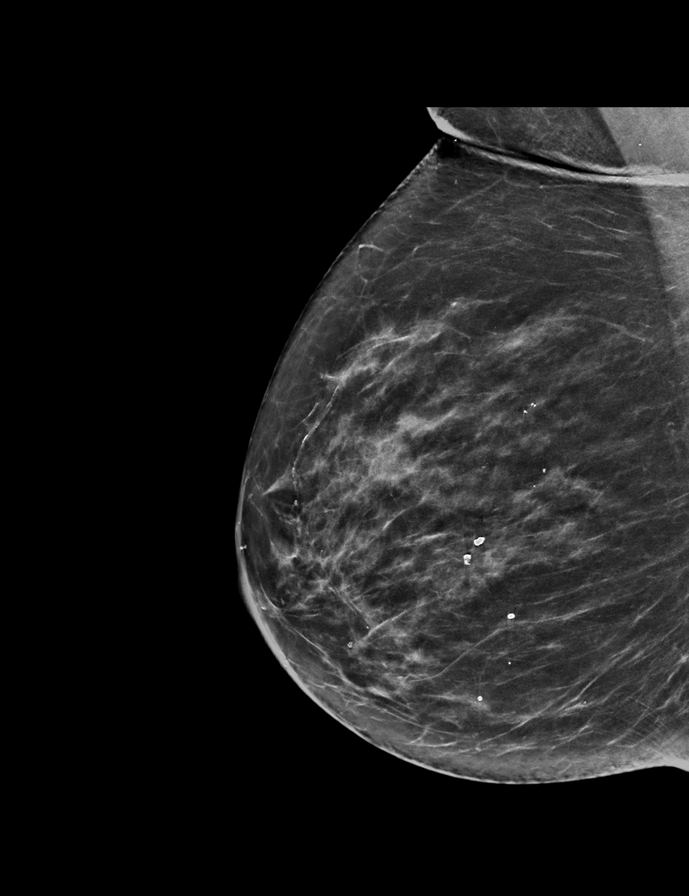

[R CC synth-2D]
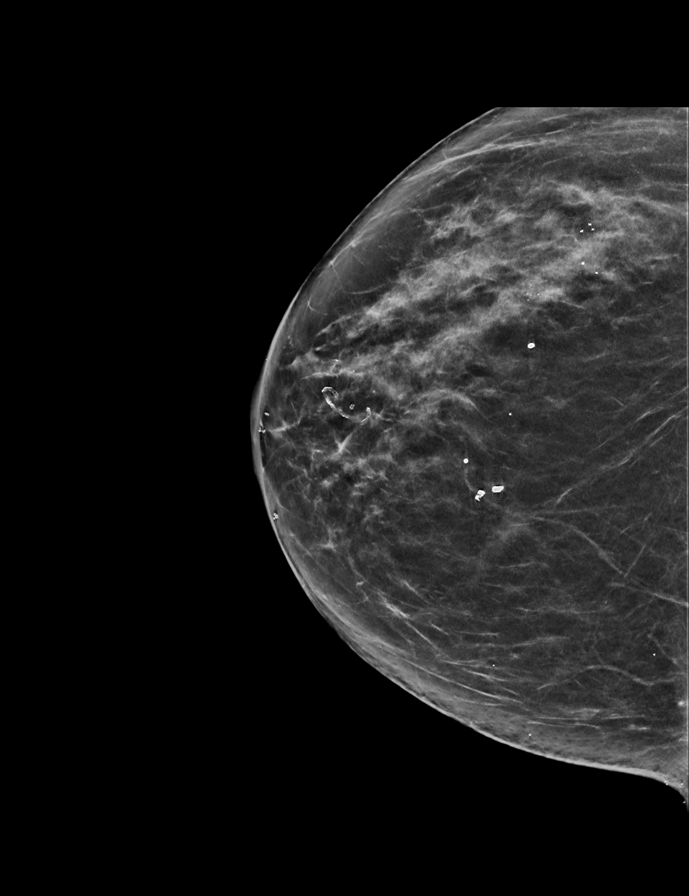

[L MLO]
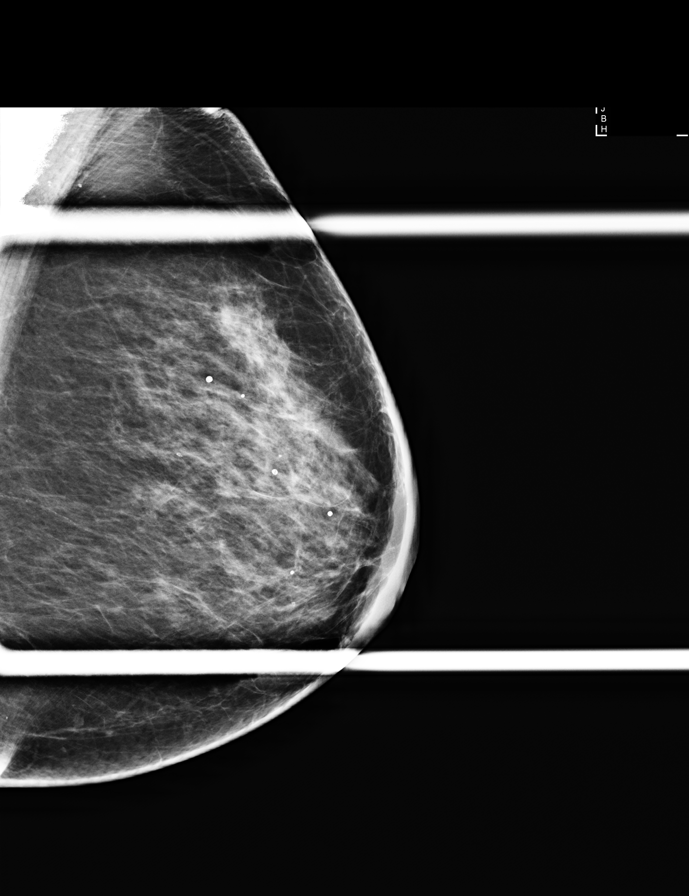

[8 of 40 positions shown; findings below may reference images not displayed]

ACR Breast Density Category c: The breast tissue is heterogeneously
dense, which may obscure small masses.
FINDINGS: Increased density is noted within the upper, outer left breast. On
tomosynthesis imaging, there is an irregular mass with associated
calcifications and architectural distortion within the upper, outer
left breast with overlying skin thickening.

No suspicious mass, calcifications, or other abnormality is
identified within the right breast.

Mammographic images were processed with CAD.

On physical exam, an incision is noted on the inferior left areola
at 6 o'clock. There is an approximately 3 cm palpable mass
underlying the incision, corresponding to the patient's original
area of concern. No discrete mass is palpated in the upper, outer
left breast.

Targeted ultrasound is performed, showing an oval, hypoechoic mass
within the skin of the left areola at 6 o'clock, 1 cm from the
nipple measuring 3 x 0.8 x 1.6 cm, corresponding to the palpable
abnormality. Evaluation of the underlying breast tissue posterior to
the subareolar mass is limited secondary to shadowing.

At 1 o'clock, 5 cm from the nipple, there is an irregular,
hypoechoic mass measuring 1.6 x 1.1 x 1.4 cm, corresponding to the
mass seen mammographically within the upper, outer quadrant.
Targeted ultrasound of the left axilla demonstrates no suspicious
appearing left axillary lymph nodes.
IMPRESSION: 1. Suspicious left breast mass at 1 o'clock, 5 cm from the nipple
with overlying skin thickening.
2. Known left breast carcinoma demonstrated by punch biopsy of the
inferior areola.

RECOMMENDATION:
Ultrasound-guided biopsy of the left breast mass at 1 o'clock, 5 cm
from the nipple. The impression and recommendations were discussed
with Dr. Muneyuki by telephone at the time of interpretation.

I have discussed the findings and recommendations with the patient.
Results were also provided in writing at the conclusion of the
visit. If applicable, a reminder letter will be sent to the patient
regarding the next appointment.

BI-RADS CATEGORY  5: Highly suggestive of malignancy.

## 2016-12-07 ENCOUNTER — Telehealth: Payer: Self-pay | Admitting: *Deleted

## 2016-12-07 NOTE — Telephone Encounter (Signed)
Patient has requested to have her last two visit lab results faxed over to [facility] dermatologist. Fax  [phone]

## 2016-12-07 NOTE — Telephone Encounter (Signed)
Spoke with the patient, clarified what needs to be sent and faxed requested labs to them, thanks

## 2016-12-14 ENCOUNTER — Other Ambulatory Visit: Payer: PPO

## 2016-12-14 ENCOUNTER — Inpatient Hospital Stay: Payer: PPO | Attending: Hematology and Oncology

## 2016-12-14 ENCOUNTER — Other Ambulatory Visit: Payer: Self-pay | Admitting: *Deleted

## 2016-12-14 DIAGNOSIS — Z7982 Long term (current) use of aspirin: Secondary | ICD-10-CM | POA: Insufficient documentation

## 2016-12-14 DIAGNOSIS — L930 Discoid lupus erythematosus: Secondary | ICD-10-CM | POA: Diagnosis not present

## 2016-12-14 DIAGNOSIS — M810 Age-related osteoporosis without current pathological fracture: Secondary | ICD-10-CM | POA: Diagnosis not present

## 2016-12-14 DIAGNOSIS — Z79899 Other long term (current) drug therapy: Secondary | ICD-10-CM | POA: Insufficient documentation

## 2016-12-14 DIAGNOSIS — C773 Secondary and unspecified malignant neoplasm of axilla and upper limb lymph nodes: Secondary | ICD-10-CM | POA: Insufficient documentation

## 2016-12-14 DIAGNOSIS — Z85850 Personal history of malignant neoplasm of thyroid: Secondary | ICD-10-CM | POA: Insufficient documentation

## 2016-12-14 DIAGNOSIS — Z170 Estrogen receptor positive status [ER+]: Secondary | ICD-10-CM | POA: Insufficient documentation

## 2016-12-14 DIAGNOSIS — N189 Chronic kidney disease, unspecified: Secondary | ICD-10-CM | POA: Diagnosis not present

## 2016-12-14 DIAGNOSIS — Z79811 Long term (current) use of aromatase inhibitors: Secondary | ICD-10-CM | POA: Diagnosis not present

## 2016-12-14 DIAGNOSIS — Z9012 Acquired absence of left breast and nipple: Secondary | ICD-10-CM | POA: Insufficient documentation

## 2016-12-14 DIAGNOSIS — I252 Old myocardial infarction: Secondary | ICD-10-CM | POA: Insufficient documentation

## 2016-12-14 DIAGNOSIS — M329 Systemic lupus erythematosus, unspecified: Secondary | ICD-10-CM | POA: Insufficient documentation

## 2016-12-14 DIAGNOSIS — I129 Hypertensive chronic kidney disease with stage 1 through stage 4 chronic kidney disease, or unspecified chronic kidney disease: Secondary | ICD-10-CM | POA: Insufficient documentation

## 2016-12-14 DIAGNOSIS — C50912 Malignant neoplasm of unspecified site of left female breast: Secondary | ICD-10-CM | POA: Insufficient documentation

## 2016-12-14 DIAGNOSIS — D649 Anemia, unspecified: Secondary | ICD-10-CM | POA: Diagnosis not present

## 2016-12-14 DIAGNOSIS — E890 Postprocedural hypothyroidism: Secondary | ICD-10-CM | POA: Diagnosis not present

## 2016-12-14 DIAGNOSIS — Z880 Allergy status to penicillin: Secondary | ICD-10-CM | POA: Insufficient documentation

## 2016-12-15 ENCOUNTER — Other Ambulatory Visit: Payer: Self-pay | Admitting: Hematology and Oncology

## 2016-12-15 DIAGNOSIS — Z170 Estrogen receptor positive status [ER+]: Principal | ICD-10-CM

## 2016-12-15 DIAGNOSIS — C50912 Malignant neoplasm of unspecified site of left female breast: Secondary | ICD-10-CM

## 2016-12-15 LAB — CANCER ANTIGEN 27.29: CA 27.29: 45.8 U/mL — ABNORMAL HIGH (ref 0.0–38.6)

## 2016-12-16 ENCOUNTER — Telehealth: Payer: Self-pay | Admitting: *Deleted

## 2016-12-16 NOTE — Telephone Encounter (Signed)
Patient informed that the tumor markers had increased the last 2 times, and that the MD wants to do a PET scan. Voiced understanding.

## 2016-12-17 DIAGNOSIS — M9902 Segmental and somatic dysfunction of thoracic region: Secondary | ICD-10-CM | POA: Diagnosis not present

## 2016-12-17 DIAGNOSIS — M9903 Segmental and somatic dysfunction of lumbar region: Secondary | ICD-10-CM | POA: Diagnosis not present

## 2016-12-17 DIAGNOSIS — M9908 Segmental and somatic dysfunction of rib cage: Secondary | ICD-10-CM | POA: Diagnosis not present

## 2016-12-23 ENCOUNTER — Ambulatory Visit
Admission: RE | Admit: 2016-12-23 | Discharge: 2016-12-23 | Disposition: A | Discharge status: Home / Self Care | Payer: PPO | Source: Ambulatory Visit | Attending: Hematology and Oncology | Admitting: Hematology and Oncology

## 2016-12-23 DIAGNOSIS — Z170 Estrogen receptor positive status [ER+]: Secondary | ICD-10-CM | POA: Insufficient documentation

## 2016-12-23 DIAGNOSIS — R590 Localized enlarged lymph nodes: Secondary | ICD-10-CM | POA: Insufficient documentation

## 2016-12-23 DIAGNOSIS — I2510 Atherosclerotic heart disease of native coronary artery without angina pectoris: Secondary | ICD-10-CM | POA: Insufficient documentation

## 2016-12-23 DIAGNOSIS — I700 Atherosclerosis of aorta: Secondary | ICD-10-CM | POA: Diagnosis not present

## 2016-12-23 DIAGNOSIS — C50912 Malignant neoplasm of unspecified site of left female breast: Secondary | ICD-10-CM | POA: Diagnosis not present

## 2016-12-23 LAB — GLUCOSE, CAPILLARY: Glucose-Capillary: 86 mg/dL (ref 65–99)

## 2016-12-23 MED ORDER — FLUDEOXYGLUCOSE F - 18 (FDG) INJECTION
13.0800 | Freq: Once | INTRAVENOUS | Status: AC | PRN
  Administered 2016-12-23: 13.08 via INTRAVENOUS

## 2016-12-25 ENCOUNTER — Inpatient Hospital Stay (HOSPITAL_BASED_OUTPATIENT_CLINIC_OR_DEPARTMENT_OTHER): Payer: PPO | Admitting: Hematology and Oncology

## 2016-12-25 ENCOUNTER — Encounter: Payer: Self-pay | Admitting: Hematology and Oncology

## 2016-12-25 VITALS — BP 138/80 | HR 93 | Temp 97.0°F | Ht 64.0 in | Wt 136.4 lb

## 2016-12-25 DIAGNOSIS — Z880 Allergy status to penicillin: Secondary | ICD-10-CM

## 2016-12-25 DIAGNOSIS — I252 Old myocardial infarction: Secondary | ICD-10-CM

## 2016-12-25 DIAGNOSIS — N189 Chronic kidney disease, unspecified: Secondary | ICD-10-CM | POA: Diagnosis not present

## 2016-12-25 DIAGNOSIS — Z85850 Personal history of malignant neoplasm of thyroid: Secondary | ICD-10-CM | POA: Diagnosis not present

## 2016-12-25 DIAGNOSIS — M810 Age-related osteoporosis without current pathological fracture: Secondary | ICD-10-CM

## 2016-12-25 DIAGNOSIS — R948 Abnormal results of function studies of other organs and systems: Secondary | ICD-10-CM

## 2016-12-25 DIAGNOSIS — Z170 Estrogen receptor positive status [ER+]: Secondary | ICD-10-CM | POA: Diagnosis not present

## 2016-12-25 DIAGNOSIS — E890 Postprocedural hypothyroidism: Secondary | ICD-10-CM

## 2016-12-25 DIAGNOSIS — M329 Systemic lupus erythematosus, unspecified: Secondary | ICD-10-CM | POA: Diagnosis not present

## 2016-12-25 DIAGNOSIS — Z9012 Acquired absence of left breast and nipple: Secondary | ICD-10-CM | POA: Diagnosis not present

## 2016-12-25 DIAGNOSIS — C50912 Malignant neoplasm of unspecified site of left female breast: Secondary | ICD-10-CM | POA: Diagnosis not present

## 2016-12-25 DIAGNOSIS — I129 Hypertensive chronic kidney disease with stage 1 through stage 4 chronic kidney disease, or unspecified chronic kidney disease: Secondary | ICD-10-CM

## 2016-12-25 DIAGNOSIS — Z7982 Long term (current) use of aspirin: Secondary | ICD-10-CM

## 2016-12-25 DIAGNOSIS — D649 Anemia, unspecified: Secondary | ICD-10-CM | POA: Diagnosis not present

## 2016-12-25 DIAGNOSIS — C773 Secondary and unspecified malignant neoplasm of axilla and upper limb lymph nodes: Secondary | ICD-10-CM | POA: Diagnosis not present

## 2016-12-25 DIAGNOSIS — Z79811 Long term (current) use of aromatase inhibitors: Secondary | ICD-10-CM

## 2016-12-25 DIAGNOSIS — Z79899 Other long term (current) drug therapy: Secondary | ICD-10-CM

## 2016-12-25 NOTE — Progress Notes (Signed)
Patient here for results.No changes since

## 2016-12-25 NOTE — Progress Notes (Signed)
[facility] day:  12/25/2016   Chief Complaint: Kim Santiago is a 82 y.o. female with stage IIB left breast cancer who is seen for 3 month assessment on Femara.  HPI:  The patient was last seen in the medical oncology clinic on 11/11/2016.  At that time, she denied any breast concerns.  Exam revealed a stable breast exam and a flare of cutaneous lupus.  CA27.29 was elevated at 40.8 (0-38.6).  Etiology of her elevated CA27.29 was felt possibly secondary to current lupus flare.  Repeat CA27.29 was 45.8.  Decision was made to pursue imaging.    PET scan on 12/23/2016 revealed slightly enlarged right lower paratracheal lymph node and some clustered small AP window lymph nodes with metabolic activity very minimally above background in the mediastinum. This very low-grade activity was more likely to be inflammatory than malignant particularly in this patient with history of lupus.  Nodes may merit surveillance.  There were no other findings of hypermetabolic lesions in the neck, chest, abdomen, or pelvis.  Right mammogram on 11/18/2016 revealed no evidence of malignancy.  Symptomatically, she denies any breast concerns.  She states that her "skin is awful and itchy".     Past Medical History:  Diagnosis Date  . Arthritis   . Breast cancer ([place]) 2017   left mastectomy done 11/2015  . Breast cancer in female [facility]) 11/18/2015   Left: 3.9 cm tumor, T2, 1/2 sentinel nodes positive for macro metastatic disease, N1, 3 negative nodes in the axillary tail, ER+,PR+, Her 2 neu, low Mammoprint score  . Cancer [facility])    thyroid takes levothyroxine  . Chronic kidney disease    UTI  . Genetic screening March 2017.   Mammoprint of left breast cancer: Low risk for recurrence.   . Hypertension   . hypothyroidism    secondary to thyroidectomy for thyroid ca  . Hypothyroidism   . Lupus   . Menopause 40s   natural, hot flashes and mood lability now gone, off  prempro 7 months  . Myocardial infarction ([place]) 2013  . Osteoporosis    Osteopenia  . Rosacea   . Sjoegren syndrome ([place])   . Stress-induced cardiomyopathy September of 2013   EF 35%. Peak troponin was 1.8.    Past Surgical History:  Procedure Laterality Date  . BACK SURGERY    . BREAST BIOPSY Left 10/30/15   positive, done in Dr. Dwyane Luo office  . CARDIAC CATHETERIZATION  05/2012   [facility]. No significant CAD. Ejection fraction of 35% due to stress-induced cardiomyopathy.  . CHOLECYSTECTOMY    . COLONOSCOPY    . DILATION AND CURETTAGE OF UTERUS    . KYPHOSIS SURGERY  Feb 2008   L1, Dr. Mauri Pole  . LUMBAR DISC SURGERY     L4-L5  . MASTECTOMY Left 11/18/2015   positive  . SENTINEL NODE BIOPSY Left 11/18/2015   Procedure: SENTINEL NODE BIOPSY;  Surgeon: Robert Bellow, MD;  Location: [facility] ORS;  Service: General;  Laterality: Left;  . SHOULDER ARTHROSCOPY  2004   Left, Dr. Jefm Bryant  . SIMPLE MASTECTOMY WITH AXILLARY SENTINEL NODE BIOPSY Left 11/18/2015   Procedure: SIMPLE MASTECTOMY;  Surgeon: Robert Bellow, MD;  Location: [facility] ORS;  Service: General;  Laterality: Left;  . SPINE SURGERY     L4-5 diskectomy  . THYROIDECTOMY     Thyroid Cancer  . TONSILLECTOMY    . TUBAL LIGATION      Family History  Problem  Relation Age of Onset  . COPD Father   . Cancer Father     esophageal  . Kidney disease Mother   . Heart disease Mother   . Kidney disease Sister   . Cancer Brother 52    colon cancer (both brothers)  . Heart attack Brother 56  . Heart disease Brother   . Breast cancer Neg Hx     Social History:  reports that she has never smoked. She has never used smokeless tobacco. She reports that she does not drink alcohol or use drugs.  She is from [place], [place].  She was married for 87 years.  Her husband died 10 months ago.  She lives alone in [place].  She has an adopted daughter and 2 grandchildren.  Her brother died of a MI following a cholecystectomy in 12/2015.   The patient is accompanied by her daughter today.  Allergies:  Allergies  Allergen Reactions  . Amoxicillin Rash  . Codeine Nausea And Vomiting  . Naprosyn [Naproxen] Swelling  . Orudis [Ketoprofen] Hives  . Plaquenil [Hydroxychloroquine] Hives  . Sulfathiazole Rash    Current Medications: Current Outpatient Prescriptions  Medication Sig Dispense Refill  . acetaminophen (TYLENOL) 650 MG CR tablet Take 650 mg by mouth every 8 (eight) hours as needed for pain.    Marland Kitchen aspirin 81 MG tablet Take 81 mg by mouth daily.    . Calcium Carbonate-Vitamin D (CALCIUM + D) 600-200 MG-UNIT TABS Take 2 tablets by mouth 2 (two) times daily.     . cholecalciferol (VITAMIN D) 1000 UNITS tablet Take 2,000 Units by mouth daily.     . furosemide (LASIX) 20 MG tablet Once daily as needed for fluid retention 90 tablet 1  . letrozole (FEMARA) 2.5 MG tablet Take 1 tablet (2.5 mg total) by mouth daily. 90 tablet 1  . levothyroxine (SYNTHROID, LEVOTHROID) 88 MCG tablet Take 1 tablet (88 mcg total) by mouth daily. 90 tablet 4  . losartan (COZAAR) 25 MG tablet Take 1 tablet (25 mg total) by mouth daily. 90 tablet 4  . metoprolol succinate (TOPROL XL) 25 MG 24 hr tablet Take 1 tablet (25 mg total) by mouth daily. 90 tablet 4  . mirtazapine (REMERON) 15 MG tablet Take 1 tablet (15 mg total) by mouth at bedtime. 90 tablet 1  . Multiple Vitamins-Minerals (OSTEO COMPLEX PO) Take 1 tablet by mouth daily. Take one by mouth daily    . omeprazole (PRILOSEC) 20 MG capsule Take 1 capsule (20 mg total) by mouth 2 (two) times daily before a meal. 180 capsule 3  . polyethylene glycol (MIRALAX / GLYCOLAX) packet Take 17 g by mouth daily.    Marland Kitchen tiZANidine (ZANAFLEX) 2 MG tablet Take 1 tablet (2 mg total) by mouth every 6 (six) hours as needed (for jaw pain). 90 tablet 0  . triamcinolone cream (KENALOG) 0.1 % Apply 1 application topically 2 (two) times daily. 30 g 0  . vitamin B-12 (CYANOCOBALAMIN) 1000 MCG tablet Take 1,000 mcg by  mouth daily.     No current facility-administered medications for this visit.     Review of Systems:  GENERAL:  Feels "ok".  No fevers or sweats.  Weight down 3 pounds. PERFORMANCE STATUS (ECOG):  1 HEENT:  No visual changes, runny nose, sore throat, mouth sores or tenderness. Lungs: No shortness of breath or cough.  No hemoptysis. Cardiac:  No chest pain, palpitations, orthopnea, or PND. GI:  No nausea, vomiting, diarrhea, constipation, melena or hematochezia. GU:  No urgency, frequency, dysuria, or hematuria. Musculoskeletal:  No back pain.  Little joint pain.  No muscle tenderness. Extremities:  No pain or swelling. Skin:  Hair thinning.  Cutaneous lupus (see HPI). Neuro:  No headache, numbness or weakness, balance or coordination issues. Endocrine:  No diabetes.  Thyroid disease on Synthroid.  Few hot flashes.  No night sweats. Psych:   No mood changes, anxiety or depression.  Pain:  No focal pain. Review of systems:  All other systems reviewed and found to be negative.  Physical Exam: Blood pressure 138/80, pulse 93, temperature 97 F (36.1 C), temperature source Tympanic, height 5' 4"  (1.626 m), weight 136 lb 6.4 oz (61.9 kg). GENERAL:  Well developed, well nourished, woman sitting comfortably in the exam room in no acute distress. MENTAL STATUS:  Alert and oriented to person, place and time. HEAD:  Short gray hair.  Normocephalic, atraumatic, face symmetric, no Cushingoid features. EYES:  Blue rimmed glasses.  Blue eyes.  Pupils equal round and reactive to light and accomodation. No conjunctivitis or scleral icterus. ENT: Oropharynx clear without lesion. Tongue normal. Mucous membranes moist.  RESPIRATORY: Clear to auscultation without rales, wheezes or rhonchi. CARDIOVASCULAR: Regular rate and rhythm without murmur, rub or gallop. No JVD. BREASTS:  Left mastectomy without erythema or nodularity.  Tender right sided fibrocystic changes.  Right breast without masses, skin  changes or nipple discharge. ABDOMEN: Soft, non-tender, with active bowel sounds, and no hepatosplenomegaly. SKIN: Extensive rash secondary to lupus.  Some excoriation. EXTREMITIES: No edema, no skin discoloration or tenderness. No palpable cords. LYMPH NODES:  No palpable cervical, supraclavicular, axillary or inguinal adenopathy  NEUROLOGICAL: Unremarkable. PSYCH: Appropriate.  Labs: Hospital Outpatient Visit on 12/23/2016  Component Date Value Ref Range Status  . Glucose-Capillary 12/23/2016 86  65 - 99 mg/dL Final    No visits with results within 3 Day(s) from this visit. Latest known visit with results is:  Admission on 11/18/2015, Discharged on 11/18/2015  Component Date Value Ref Range Status  . SURGICAL PATHOLOGY 11/18/2015    Final-Edited                   Value:Surgical Pathology THIS IS AN ADDENDUM REPORT CASE: [id number] PATIENT: [facility] Surgical Pathology Report Addendum  Reason for Addendum #1:  Molecular studies  SPECIMEN SUBMITTED: A. Breast, left B. Sentinel lymph node #1  #2  CLINICAL HISTORY: None provided  PRE-OPERATIVE DIAGNOSIS: Left breast cancer  POST-OPERATIVE DIAGNOSIS: Same as pre-op     DIAGNOSIS: A. LEFT BREAST; TOTAL MASTECTOMY: - SOLITARY INVASIVE MAMMARY CARCINOMA OF NO SPECIAL TYPE, 3.9 CM. - DUCTAL CARCINOMA IN SITU. - LYMPH VASCULAR AND DERMAL LYMPHATIC INVASION IDENTIFIED. - THE SURGICAL MARGINS ARE NEGATIVE. - MICROCALCIFICATIONS IN BENIGN AND MALIGNANT TISSUE. - THREE BENIGN AXILLARY LYMPH NODES (0/3). - SEE SUMMARY BELOW.  B. SENTINEL LYMPH NODE #1 AND 2; EXCISION: - ONE OF TWO LYMPH NODES POSITIVE FOR MACRO METASTASES (1/2).   INVASIVE CARCINOMA OF THE BREAST: Complete Excision Specimens InvolvedA: Breast, left B: Sentinel lymph nod                         e #1  #2  SPECIMEN Procedure:     Total mastectomy (including nipple and skin) Lymph Node Sampling:     Sentinel lymph node(s) Specimen  Laterality:     Left TUMOR Presence of Invasive Carcinoma:    Histologic Type: Invasive mammary carcinoma of no special type (ductal, not otherwise specified) Histologic  Grade:   Glandular (acinar) / tubular differentiation: Score 3 (< 10% of tumor area forming glandular / tubular structures) Nuclear pleomorphism: Score 2 (cells larger than normal with open vesicular nuclei, visible nucleoli, and moderate variability in both size and shape) Mitotic rate:  Score 1 (<=3 mitoses per mm2) Overall grade: Grade 2 (scores of 6 or 7) Ductal Carcinoma In Situ (DCIS):   DCIS is present Negative for extensive intraductal component (EIC) Tumor Size / Focality:   Tumor size: size of largest invasive carcinoma: Greatest dimension of largest focus of invasion > 1 mm: 6m Tumor focality:     Single focus of invasive carcinoma Tumor Extent Macr                         oscopic and Microscopic Extent of Tumor Skin:     Skin invasion: Invasive carcinoma directly invades into the dermis or epidermis without skin ulceration (this does not change the T stage) Skin satellite foci:     Negative for satellite foci Nipple DCIS:   DCIS does not involve the nipple epidermis Skeletal muscle:    Specimen does not contain skeletal muscle Accessory Tumor Findings Lymph-Vascular Invasion: Present Treatment Effect: Response to Presurgical (Neoadjuvant) Therapy: No known presurgical therapy MARGINS Invasive Carcinoma: Margins negative for invasive carcinoma Distance from closest margin: Distance is > 10 mm Closest Negative Margin(s):   Posterior Ductal Carcinoma In Situ (DCIS):   Margins negative for DCIS (DCIS present in specimen) Distance of DCIS from closest margin:   Distance is > 10 mm Closest Negative Margin(s):   Posterior LYMPH NODES Regional Lymph Nodes:    Number of Sentinel Nodes Examined: Specify number 2 Number of lymph node(s) exam                         ined:  Number 5 Lymph node  involvement:  Number of lymph nodes with macrometastases (> 2 mm): Number 1 Number of lymph nodes with micrometastases: Number 0 Number of lymph nodes with isolated tumor cells (<= 0.2 mm and <= 200 cells):   Number 0 STAGE (pTNM) TNM Descriptors:    Not applicable n/a Primary Tumor (Invasive Carcinoma) (pT):     pT2:  tumor > 20 mm but <= 50 mm in greatest dimension Regional lymph nodes (pN) (choose a category based on lymph nodes received with the specimen;  immunohistochemistry and / or molecular studies are not required) Category (pN): pN1a: metastases in 1 to 3 axillary lymph nodes, at least 1 metastasis greater than 2 mm Distant Metastasis (pM): Not applicable - pM cannot be determined from the submitted specimen(s) Comment(s): Invasive carcinoma is identified in tissue between the two masses. The tumor is entirely submitted and is present in 13 slides.   GROSS DESCRIPTION: A. Labeled: Left breast  Time in fixative: 3:10                          PM on 11/18/2015  Cold ischemic time: 40 minutes  Total fixation time: 6 1/2 Hours  Type of mastectomy: Simple  Laterality: Left  Weight of specimen: 707 grams  Size of specimen: 23.5 x 20.5 x 3.9 cm  Orientation of specimen: The letters M and L are written in surgical marker on the skin surface to designate the medial and lateral aspects specimen. Anterior half specimen inked blue, posterior specimen inked black.  Skin ellipse dimensions  description:  15.5 x 11.2 cm, unremarkable  Nipple/ areola: Nipple 1 cm in diameter, flattened. Areola 4.3 cm in diameter, unremarkable  Axillary tail: Absent  Biopsy site(s): 2 discrete biopsy sites identified  Number of discrete masses: 2  Location of mass(es): One mass is located approximately at the 6:00 position behind the nipple areolar complex, and the other is located in the approximate 1:00 position in the upper outer quadrant.  Distance between masses:  Approximately 1.8 cm  Size of mass(es)/biopsy site(s):                          First mass approximately 1 x 0.9 x 0.5 cm, and second mass approximately 1 x 0.7 x 0.8 cm  Description of mass(es)/biopsy site(s): The first mass is surrounded by a large amount of fibrous tissue measures approximately 2.5 x 2.5 x 1.5 cm. Both the first and second masses are extremely poorly defined, stellate, pale tan to white.  Margins: The first mass is grossly located 3.5 cm from the deep margin and the second mass is grossly located 3 cm from the deep margin  Gross involvement of skin/fascia/muscle by tumor: Absent  Description of remaining breast: The rest the breast tissue is predominantly fatty with interspersed areas of dense white fibrous tissue  Lymph nodes: On dissection 3 lymph node candidates identified from 0.5 cm to 1.2 x 0.8 x 0.5 cm.  Block Summary: 1-skin 2-nipple 3-deep margin of resection 4-9 entire first mass including biopsy cavity and adjacent fibrous tissue at the left breast mass 10-12-entire second mass 13-16-breast tissue between                          first and second mass 17-representative section upper inner quadrant 18-representative section lower inner quadrant 19-representative section upper outer quadrant 20-representative section lower outer quadrant 21- 2 lymph node candidates submitted on 11/20/15 22-1 sectioned lymph node candidate submitted on 11/20/15  B. Labeled: Sentinel node #1 and 2  Tissue fragment(s): 2  Size: 0.4 x 0.3 x 0.2 cm and 1.2 x 0.6 x 0.6 cm  Description: 2 pink red lymph node candidates covered by adipose tissue, smaller inked blue  Entirely submitted in one cassette(s).         Final Diagnosis performed by Delorse Lek, MD.  Electronically signed 11/21/2015 2:59:43PM   The electronic signature indicates that the named Attending Pathologist has evaluated the specimen  Technical component performed at [facility], [address] [place], [place] [address] Lab: [phone] Dir: Darrick Penna. Evette Doffing, MD  Professional component performed at [facility], [facility],                          [place], [place], [place] [address] Lab: [phone] Dir: Dellia Nims. Reuel Derby, MD   ADDENDUM: At Dr. Kem Parkinson request, FFPE tumor tissue (block A8) was submitted to [place], [place], for MammaPrint testing, a 70-gene expression profile. The assay result is reported as Low Risk, +0.192. Please see separate report, requisition number [id number], in [place].    Addendum #1 performed by Bryan Lemma, MD.  Electronically signed 12/24/2015 12:25:01PM    Technical component performed at [facility], [address], [place], [place] [address] Lab: [phone] Dir: Darrick Penna. Evette Doffing, MD  Professional component performed at [facility], [facility], [place], [place], [place] [address] Lab: [phone] Dir: Dellia Nims. Reuel Derby, MD  Assessment:  EKTA DANCER is a 82 y.o. female with stage IIB left breast cancer s/p mastectomy on 11/18/2015.  Pathology revealed a 3.9 cm grade II invasive mammary carcinoma.  There was ductal carcinoma in situ (DCIS).  There was lymphvascular and dermal lymphatic invasion.  Margins were negative.  One of 2 sentinel lymph nodes were positive for macrometastasis.  In addition, there were 3 benign axillary lymph nodes (total 5 lymph nodes).  Tumor was estrogen receptor positive (100%), progesterone receptor positive (70%), and Her2/neu negative.  Pathologic stage was T2N1a.   Bilateral mammogram and ultrasound on 11/11/2015 revealed a 3 cm left breast mass at the 6 o'clock position (1 cm from the nipple) and a 1.6 cm irregular hypoechoic left breast mass at 1 o'clock position (5 cm from the nipple) with overlying skin thickening.  Right mammogram on 11/18/2016 revealed no evidence of malignancy.  MammaPrint on 12/18/2015 revealed a low risk of recurrence with  a 5% risk of distant recurrence of 5 years and a 10% recurrence at 10 years.  She declined radiation.  She began Femara on 01/09/2016.  She has a little joint pain and a few hot flashes.  PET scan on 12/23/2016 revealed slightly enlarged right lower paratracheal lymph node and some clustered small AP window lymph nodes with metabolic activity very minimally above background in the mediastinum.  There were no other findings of hypermetabolic lesions in the neck, chest, abdomen, or pelvis.  CA27.29 has been followed: 30.4 on 11/15/2015, 32.5 on 05/13/2016, 19.0 on 08/12/2016, 40.8 on 11/11/2016, and 45.8 on 12/14/2016.  Bone density study on 12/05/2015 revealed osteopenia with a T-score of -1.5 in the left femoral neck.  She is on calcium and vitamin D.  She has a history of a myocardial infarction in 06/2012.  Echocardiogram revealed an EF of 35%.  Follow-up echo on 09/21/2015 revealed an EF of 55-60%.  Patient is unaware of a history of heart failure.   She has a history of thyroid carcinoma 40 years ago treated with I-131.   She has a mild normocytic anemia.  Diet is good.  Colonoscopy on 07/25/2013 revealed 3 small polyps (82m proximal descending colon, 3 mm splenic flexure, 1 mm ascending colon).  She denies any melena or hematochezia.  TSH was normal on 12/26/2015.  She has cutaneous lupus and Sjogren's.  She had a reaction to Plaquenil.  She completed an extended steroid taper.   Symptomatically, she denies any breast concerns.  Exam reveals a stable breast exam and cutaneous lupus.  Plan: 1.  Labs today:  CBC with diff, CMP, CA27.29. 2.  Review interval PET scan.  Suspect nodes are inflammatory.  Discuss plans for follow-up chest CT. 3.  Review interval mammogram.  No evidence of malignancy. 4.  Continue Femara. 5.  Schedule chest CT without contrast on 03/16/2017. 6.  RTC on 03/17/2017 for MD assessment and labs (CBC with diff, CMP, CA27.29).   MLequita Asal MD  12/25/2016 ,  12:11 PM

## 2017-01-08 ENCOUNTER — Encounter: Payer: Self-pay | Admitting: Internal Medicine

## 2017-01-08 ENCOUNTER — Ambulatory Visit (INDEPENDENT_AMBULATORY_CARE_PROVIDER_SITE_OTHER): Payer: PPO | Admitting: Internal Medicine

## 2017-01-08 VITALS — BP 140/72 | HR 93 | Temp 98.1°F | Resp 15 | Ht 64.0 in | Wt 143.8 lb

## 2017-01-08 DIAGNOSIS — F331 Major depressive disorder, recurrent, moderate: Secondary | ICD-10-CM

## 2017-01-08 DIAGNOSIS — Z79899 Other long term (current) drug therapy: Secondary | ICD-10-CM | POA: Diagnosis not present

## 2017-01-08 DIAGNOSIS — L930 Discoid lupus erythematosus: Secondary | ICD-10-CM | POA: Diagnosis not present

## 2017-01-08 DIAGNOSIS — I872 Venous insufficiency (chronic) (peripheral): Secondary | ICD-10-CM

## 2017-01-08 DIAGNOSIS — E032 Hypothyroidism due to medicaments and other exogenous substances: Secondary | ICD-10-CM | POA: Diagnosis not present

## 2017-01-08 MED ORDER — MIRTAZAPINE 7.5 MG PO TABS: 7.5000 mg | ORAL_TABLET | Freq: Every day | ORAL | 1 refills | Status: DC

## 2017-01-08 NOTE — Progress Notes (Signed)
Subjective:  Patient ID: Kim Santiago, female    DOB: [birth year]  Age: 82 y.o. MRN: [id number]  CC: The primary encounter diagnosis was Iatrogenic hypothyroidism. Diagnoses of Long-term use of high-risk medication, Discoid lupus, Major depressive disorder, recurrent episode, moderate ([place]), and Venous insufficiency of both lower extremities were also pertinent to this visit.  HPI [name] presents for follow up on multiple issues:  Major depressive disordered: .  Last seen jan 29., has been tolerating remeron .  feeeling more energetic,  Sleeping better,  Appetite improved.   brca follow up done with onoclogy, tolerating femara.  HAD PET SCAN AND CA 27 .29  .  REPEAT PET IN July PLANNED.  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 Seeing dermatologist Dr. Evorn Gong,  MTX with folic acid started were 2 weeks ago needs labs next  Week and then every 6 weeks. Skin lesions are becoming less pronounced.   Has developed some lower extremity edema. despite taking furosemide daily .  Denies shortness of breath, leg cramping and leg pain .  Lifestyle is sedentary     Lab Results  Component Value Date   TSH 1.52 06/08/2016      Outpatient Medications Prior to Visit  Medication Sig Dispense Refill  . acetaminophen (TYLENOL) 650 MG CR tablet Take 650 mg by mouth every 8 (eight) hours as needed for pain.    Marland Kitchen aspirin 81 MG tablet Take 81 mg by mouth daily.    . Calcium Carbonate-Vitamin D (CALCIUM + D) 600-200 MG-UNIT TABS Take 2 tablets by mouth 2 (two) times daily.     . cholecalciferol (VITAMIN D) 1000 UNITS tablet Take 2,000 Units by mouth daily.     . furosemide (LASIX) 20 MG tablet Once daily as needed for fluid retention 90 tablet 1  . letrozole (FEMARA) 2.5 MG tablet Take 1 tablet (2.5 mg total) by mouth daily. 90 tablet 1  . levothyroxine (SYNTHROID, LEVOTHROID) 88 MCG tablet Take 1 tablet (88 mcg total) by mouth daily. 90 tablet 4  . losartan (COZAAR) 25 MG tablet  Take 1 tablet (25 mg total) by mouth daily. 90 tablet 4  . metoprolol succinate (TOPROL XL) 25 MG 24 hr tablet Take 1 tablet (25 mg total) by mouth daily. 90 tablet 4  . omeprazole (PRILOSEC) 20 MG capsule Take 1 capsule (20 mg total) by mouth 2 (two) times daily before a meal. 180 capsule 3  . polyethylene glycol (MIRALAX / GLYCOLAX) packet Take 17 g by mouth daily.    Marland Kitchen triamcinolone cream (KENALOG) 0.1 % Apply 1 application topically 2 (two) times daily. 30 g 0  . vitamin B-12 (CYANOCOBALAMIN) 1000 MCG tablet Take 1,000 mcg by mouth daily.    . mirtazapine (REMERON) 15 MG tablet Take 1 tablet (15 mg total) by mouth at bedtime. 90 tablet 1  . tiZANidine (ZANAFLEX) 2 MG tablet Take 1 tablet (2 mg total) by mouth every 6 (six) hours as needed (for jaw pain). (Patient not taking: Reported on 01/08/2017) 90 tablet 0  . Multiple Vitamins-Minerals (OSTEO COMPLEX PO) Take 1 tablet by mouth daily. Take one by mouth daily     No facility-administered medications prior to visit.     Review of Systems;  Patient denies headache, fevers, malaise, unintentional weight loss, skin rash, eye pain, sinus congestion and sinus pain, sore throat, dysphagia,  hemoptysis , cough, dyspnea, wheezing, chest pain, palpitations, orthopnea, edema, abdominal pain, nausea, melena, diarrhea, constipation, flank pain, dysuria, hematuria,  urinary  Frequency, nocturia, numbness, tingling, seizures,  Focal weakness, Loss of consciousness,  Tremor, insomnia, depression, anxiety, and suicidal ideation.      Objective:  BP 140/72 (BP Location: Right Arm, Patient Position: Sitting, Cuff Size: Normal)   Pulse 93   Temp 98.1 F (36.7 C) (Oral)   Resp 15   Ht _0  (1.626 m)   Wt 143 lb 12.8 oz (65.2 kg)   SpO2 97%   BMI 24.68 kg/m   BP Readings from Last 3 Encounters:  01/08/17 140/72  12/25/16 138/80  12/02/16 (!) 148/78    Wt Readings from Last 3 Encounters:  01/08/17 143 lb 12.8 oz (65.2 kg)  12/25/16 136 lb 6.4 oz  (61.9 kg)  12/02/16 140 lb (63.5 kg)    General appearance: alert, cooperative and appears stated age Ears: normal TM's and external ear canals both ears Throat: lips, mucosa, and tongue normal; teeth and gums normal Neck: no adenopathy, no carotid bruit, supple, symmetrical, trachea midline and thyroid not enlarged, symmetric, no tenderness/mass/nodules Back: symmetric, no curvature. ROM normal. No CVA tenderness. Lungs: clear to auscultation bilaterally Heart: regular rate and rhythm, S1, S2 normal, no murmur, click, rub or gallop Abdomen: soft, non-tender; bowel sounds normal; no masses,  no organomegaly Pulses: 2+ and symmetric Skin: arms covered in erythematous discoid macules Skin color, texture, turgor suggestive of venous insufficiency .  No rashes or lesions Lymph nodes: Cervical, supraclavicular, and axillary nodes normal.  No results found for: HGBA1C  Lab Results  Component Value Date   CREATININE 0.64 10/05/2016   CREATININE 0.70 08/25/2016   CREATININE 0.72 08/12/2016    Lab Results  Component Value Date   WBC 4.1 11/11/2016   HGB 11.6 (L) 11/11/2016   HCT 34.4 (L) 11/11/2016   PLT 174 11/11/2016   GLUCOSE 94 10/05/2016   CHOL 170 06/08/2016   TRIG 86.0 06/08/2016   HDL 44.20 06/08/2016   LDLDIRECT 117.6 12/27/2012   LDLCALC 108 (H) 06/08/2016   ALT 6 10/05/2016   AST 17 10/05/2016   NA 140 10/05/2016   K 4.6 10/05/2016   CL 105 10/05/2016   CREATININE 0.64 10/05/2016   BUN 12 10/05/2016   CO2 33 (H) 10/05/2016   TSH 1.52 06/08/2016    Nm Pet Image Initial (pi) Skull Base To Thigh  Result Date: 12/23/2016 CLINICAL DATA:  Subsequent treatment strategy for left breast cancer with rising tumor markers. Left mastectomy in March 2017. Remote history of thyroid cancer. EXAM: NUCLEAR MEDICINE PET SKULL BASE TO THIGH TECHNIQUE: 13.1 mCi F-18 FDG was injected intravenously. Full-ring PET imaging was performed from the skull base to thigh after the radiotracer.  CT data was obtained and used for attenuation correction and anatomic localization. FASTING BLOOD GLUCOSE:  Value: 86 mg/dl COMPARISON:  Multiple exams, including chest CT 05/30/2012 FINDINGS: NECK No hypermetabolic lymph nodes in the neck. CHEST 1.1 cm right lower paratracheal node anterior to the carina, maximum SUV 3.4, background mediastinal blood pool activity 2.2. Small indistinctly marginated AP window lymph nodes are clustered and have a maximum SUV of 3.1. Small bilateral axillary lymph nodes are not hypermetabolic. Coronary, aortic arch, and branch vessel atherosclerotic vascular disease. Left mastectomy noted. Mild biapical pleuroparenchymal scarring. No obvious worrisome lung nodules. ABDOMEN/PELVIS No abnormal hypermetabolic activity within the liver, pancreas, adrenal glands, or spleen. No hypermetabolic lymph nodes in the abdomen or pelvis. Cholecystectomy. Aortoiliac atherosclerotic vascular disease. Faint calcifications along both adnexa. SKELETON No focal hypermetabolic activity to suggest skeletal metastasis. Prior  compression at L1 with vertebral augmentation IMPRESSION: 1. Slightly enlarged right lower paratracheal lymph node and some clustered small AP window lymph nodes have metabolic activity very minimally above background in the mediastinum. This very low-grade activity is more likely to be inflammatory than malignant particularly in this patient with history of lupus. These nodes may merit surveillance. 2. No other findings of hypermetabolic lesions in the neck, chest, abdomen, or pelvis. 3. Aortic Atherosclerosis (ICD10-I70.0). Coronary artery atherosclerosis. Electronically Signed   By: Van Clines M.D.   On: 12/23/2016 10:52    Assessment & Plan:   Problem List Items Addressed This Visit    Discoid lupus    Adverse reaction to placquenil.  MTX started 2 weeks ago by Dr. Evorn Gong.  Return for CBC ad CMET 1 week      Iatrogenic hypothyroidism - Primary    Secondary to  total thyroidectomy  for thyroid cancer, which occurred over 5 years ago,   Thyroid function is WNL on current dose.  No current changes needed.   Lab Results  Component Value Date   TSH 1.52 06/08/2016           Relevant Orders   TSH   Major depressive disorder, recurrent episode, moderate ([facility])    Improved with 7.5 mg remeron.  Continue current dose.       Relevant Medications   mirtazapine (REMERON) 7.5 MG tablet   Venous insufficiency of both lower extremities    Suspected by exam and history.    Pathophysiology explained and Proper use of compression stockings described.       Other Visit Diagnoses    Long-term use of high-risk medication       Relevant Orders   Comprehensive metabolic panel   CBC with Differential/Platelet     A total of 25 minutes of face to face time was spent with patient more than half of which was spent in counselling about the above mentioned conditions  and coordination of care  I have discontinued Ms. Wilczak's Multiple Vitamins-Minerals (OSTEO COMPLEX PO). I have also changed her mirtazapine. Additionally, I am having her maintain her cholecalciferol, Calcium Carbonate-Vitamin D, vitamin B-12, aspirin, polyethylene glycol, triamcinolone cream, acetaminophen, tiZANidine, metoprolol succinate, levothyroxine, losartan, letrozole, furosemide, omeprazole, and methotrexate.  Meds ordered this encounter  Medications  . methotrexate (RHEUMATREX) 2.5 MG tablet    Sig: Take 2.5 mg by mouth once a week. Caution:Chemotherapy. Protect from light.  . mirtazapine (REMERON) 7.5 MG tablet    Sig: Take 1 tablet (7.5 mg total) by mouth at bedtime.    Dispense:  90 tablet    Refill:  1    NOTE DOSE CHANGE,  PATIENT TAKING 7.5 MG , KEEP ON FILE FOR FUTURE REFILLS    Medications Discontinued During This Encounter  Medication Reason  . Multiple Vitamins-Minerals (OSTEO COMPLEX PO) Patient has not taken in last 30 days  . mirtazapine (REMERON) 15 MG tablet  Reorder    Follow-up: Return in about 6 months (around 07/11/2017) for needs lab appt set up .   [name], MD

## 2017-01-08 NOTE — Progress Notes (Signed)
Pre visit review using our clinic review tool, if applicable. No additional management support is needed unless otherwise documented below in the visit note. 

## 2017-01-08 NOTE — Patient Instructions (Addendum)
You can make your lab appointment here at our office .   Continue your current vit d level of supplementation .   You do not need to change diuretics because your potassium level is fine    continue 7.5 mg of mirtazipine ;  I have corrected the mail order prescriptions   Your legs swell because of venous insufficiency.  More diuretics will not necessarily help  [email or web address] has a great  selection of light weight compression  Garments for legs

## 2017-01-10 DIAGNOSIS — I872 Venous insufficiency (chronic) (peripheral): Secondary | ICD-10-CM | POA: Insufficient documentation

## 2017-01-10 NOTE — Assessment & Plan Note (Signed)
Suspected by exam and history.    Pathophysiology explained and Proper use of compression stockings described.

## 2017-01-10 NOTE — Assessment & Plan Note (Signed)
Adverse reaction to placquenil.  MTX started 2 weeks ago by Dr. Evorn Gong.  Return for CBC ad CMET 1 week

## 2017-01-10 NOTE — Assessment & Plan Note (Addendum)
Improved with 7.5 mg remeron.  Continue current dose.

## 2017-01-10 NOTE — Assessment & Plan Note (Signed)
Secondary to total thyroidectomy  for thyroid cancer, which occurred over 5 years ago,   Thyroid function is WNL on current dose.  No current changes needed.   Lab Results  Component Value Date   TSH 1.52 06/08/2016

## 2017-01-25 DIAGNOSIS — M9903 Segmental and somatic dysfunction of lumbar region: Secondary | ICD-10-CM | POA: Diagnosis not present

## 2017-01-25 DIAGNOSIS — M9908 Segmental and somatic dysfunction of rib cage: Secondary | ICD-10-CM | POA: Diagnosis not present

## 2017-01-25 DIAGNOSIS — M9902 Segmental and somatic dysfunction of thoracic region: Secondary | ICD-10-CM | POA: Diagnosis not present

## 2017-02-03 ENCOUNTER — Other Ambulatory Visit (INDEPENDENT_AMBULATORY_CARE_PROVIDER_SITE_OTHER): Payer: PPO

## 2017-02-03 ENCOUNTER — Telehealth: Payer: Self-pay | Admitting: Radiology

## 2017-02-03 DIAGNOSIS — E032 Hypothyroidism due to medicaments and other exogenous substances: Secondary | ICD-10-CM | POA: Diagnosis not present

## 2017-02-03 DIAGNOSIS — Z79899 Other long term (current) drug therapy: Secondary | ICD-10-CM

## 2017-02-03 LAB — CBC WITH DIFFERENTIAL/PLATELET
Basophils Absolute: 0 10*3/uL (ref 0.0–0.1)
Basophils Relative: 1.1 % (ref 0.0–3.0)
Eosinophils Absolute: 0.2 10*3/uL (ref 0.0–0.7)
Eosinophils Relative: 6.1 % — ABNORMAL HIGH (ref 0.0–5.0)
HCT: 31.6 % — ABNORMAL LOW (ref 36.0–46.0)
Hemoglobin: 11 g/dL — ABNORMAL LOW (ref 12.0–15.0)
Lymphocytes Relative: 32 % (ref 12.0–46.0)
Lymphs Abs: 1.2 10*3/uL (ref 0.7–4.0)
MCHC: 34.8 g/dL (ref 30.0–36.0)
MCV: 94.9 fl (ref 78.0–100.0)
Monocytes Absolute: 0.4 10*3/uL (ref 0.1–1.0)
Monocytes Relative: 10.5 % (ref 3.0–12.0)
Neutro Abs: 1.8 10*3/uL (ref 1.4–7.7)
Neutrophils Relative %: 50.3 % (ref 43.0–77.0)
Platelets: 169 10*3/uL (ref 150.0–400.0)
RBC: 3.33 Mil/uL — ABNORMAL LOW (ref 3.87–5.11)
RDW: 16.3 % — ABNORMAL HIGH (ref 11.5–15.5)
WBC: 3.7 10*3/uL — ABNORMAL LOW (ref 4.0–10.5)

## 2017-02-03 LAB — COMPREHENSIVE METABOLIC PANEL
ALBUMIN: 3.8 g/dL (ref 3.5–5.2)
ALT: 4 U/L (ref 0–35)
AST: 13 U/L (ref 0–37)
Alkaline Phosphatase: 83 U/L (ref 39–117)
BUN: 17 mg/dL (ref 6–23)
CALCIUM: 9.1 mg/dL (ref 8.4–10.5)
CHLORIDE: 103 meq/L (ref 96–112)
CO2: 32 mEq/L (ref 19–32)
CREATININE: 0.85 mg/dL (ref 0.40–1.20)
GFR: 68.38 mL/min (ref >60.00)
Glucose, Bld: 100 mg/dL — ABNORMAL HIGH (ref 70–99)
POTASSIUM: 4.4 meq/L (ref 3.5–5.1)
Sodium: 138 mEq/L (ref 135–145)
Total Bilirubin: 0.5 mg/dL (ref 0.2–1.2)
Total Protein: 6.7 g/dL (ref 6.0–8.3)

## 2017-02-03 LAB — TSH: TSH: 1.7 u[IU]/mL (ref 0.35–4.50)

## 2017-02-03 NOTE — Telephone Encounter (Signed)
Pt had labs drawn today but states she needs her results faxed to her Dermatologist as well. Told pt to remind nurse when called with lab results.

## 2017-02-05 ENCOUNTER — Encounter: Payer: Self-pay | Admitting: Internal Medicine

## 2017-02-08 DIAGNOSIS — L931 Subacute cutaneous lupus erythematosus: Secondary | ICD-10-CM | POA: Diagnosis not present

## 2017-02-23 ENCOUNTER — Other Ambulatory Visit: Payer: Self-pay | Admitting: *Deleted

## 2017-02-23 ENCOUNTER — Telehealth: Payer: Self-pay | Admitting: Hematology and Oncology

## 2017-02-23 DIAGNOSIS — C50912 Malignant neoplasm of unspecified site of left female breast: Secondary | ICD-10-CM

## 2017-02-23 DIAGNOSIS — Z170 Estrogen receptor positive status [ER+]: Principal | ICD-10-CM

## 2017-02-23 MED ORDER — LETROZOLE 2.5 MG PO TABS: 2.5000 mg | ORAL_TABLET | Freq: Every day | ORAL | 2 refills | Status: DC

## 2017-02-23 NOTE — Telephone Encounter (Signed)
She needs her chemo refiilled  [facility] ([facility]

## 2017-03-04 DIAGNOSIS — L930 Discoid lupus erythematosus: Secondary | ICD-10-CM | POA: Diagnosis not present

## 2017-03-04 DIAGNOSIS — Z5181 Encounter for therapeutic drug level monitoring: Secondary | ICD-10-CM | POA: Diagnosis not present

## 2017-03-15 ENCOUNTER — Ambulatory Visit
Admission: RE | Admit: 2017-03-15 | Discharge: 2017-03-15 | Disposition: A | Discharge status: Home / Self Care | Payer: PPO | Source: Ambulatory Visit | Attending: Hematology and Oncology | Admitting: Hematology and Oncology

## 2017-03-15 DIAGNOSIS — Z170 Estrogen receptor positive status [ER+]: Secondary | ICD-10-CM | POA: Insufficient documentation

## 2017-03-15 DIAGNOSIS — C50912 Malignant neoplasm of unspecified site of left female breast: Secondary | ICD-10-CM | POA: Insufficient documentation

## 2017-03-15 DIAGNOSIS — R590 Localized enlarged lymph nodes: Secondary | ICD-10-CM | POA: Diagnosis not present

## 2017-03-15 DIAGNOSIS — I700 Atherosclerosis of aorta: Secondary | ICD-10-CM | POA: Insufficient documentation

## 2017-03-15 DIAGNOSIS — R948 Abnormal results of function studies of other organs and systems: Secondary | ICD-10-CM | POA: Diagnosis not present

## 2017-03-17 ENCOUNTER — Inpatient Hospital Stay: Payer: PPO | Attending: Hematology and Oncology | Admitting: Hematology and Oncology

## 2017-03-17 ENCOUNTER — Inpatient Hospital Stay: Payer: PPO

## 2017-03-17 ENCOUNTER — Encounter: Payer: Self-pay | Admitting: Hematology and Oncology

## 2017-03-17 VITALS — BP 131/78 | HR 73 | Temp 97.6°F | Resp 18 | Wt 144.4 lb

## 2017-03-17 DIAGNOSIS — Z800 Family history of malignant neoplasm of digestive organs: Secondary | ICD-10-CM | POA: Insufficient documentation

## 2017-03-17 DIAGNOSIS — M329 Systemic lupus erythematosus, unspecified: Secondary | ICD-10-CM | POA: Insufficient documentation

## 2017-03-17 DIAGNOSIS — R232 Flushing: Secondary | ICD-10-CM | POA: Diagnosis not present

## 2017-03-17 DIAGNOSIS — M3500 Sicca syndrome, unspecified: Secondary | ICD-10-CM | POA: Diagnosis not present

## 2017-03-17 DIAGNOSIS — C50912 Malignant neoplasm of unspecified site of left female breast: Secondary | ICD-10-CM | POA: Diagnosis not present

## 2017-03-17 DIAGNOSIS — C773 Secondary and unspecified malignant neoplasm of axilla and upper limb lymph nodes: Secondary | ICD-10-CM

## 2017-03-17 DIAGNOSIS — M810 Age-related osteoporosis without current pathological fracture: Secondary | ICD-10-CM | POA: Insufficient documentation

## 2017-03-17 DIAGNOSIS — R599 Enlarged lymph nodes, unspecified: Secondary | ICD-10-CM

## 2017-03-17 DIAGNOSIS — I252 Old myocardial infarction: Secondary | ICD-10-CM | POA: Diagnosis not present

## 2017-03-17 DIAGNOSIS — Z880 Allergy status to penicillin: Secondary | ICD-10-CM | POA: Insufficient documentation

## 2017-03-17 DIAGNOSIS — Z9012 Acquired absence of left breast and nipple: Secondary | ICD-10-CM | POA: Diagnosis not present

## 2017-03-17 DIAGNOSIS — N189 Chronic kidney disease, unspecified: Secondary | ICD-10-CM | POA: Diagnosis not present

## 2017-03-17 DIAGNOSIS — Z79811 Long term (current) use of aromatase inhibitors: Secondary | ICD-10-CM | POA: Diagnosis not present

## 2017-03-17 DIAGNOSIS — Z79899 Other long term (current) drug therapy: Secondary | ICD-10-CM | POA: Diagnosis not present

## 2017-03-17 DIAGNOSIS — Z170 Estrogen receptor positive status [ER+]: Principal | ICD-10-CM

## 2017-03-17 DIAGNOSIS — I129 Hypertensive chronic kidney disease with stage 1 through stage 4 chronic kidney disease, or unspecified chronic kidney disease: Secondary | ICD-10-CM | POA: Diagnosis not present

## 2017-03-17 DIAGNOSIS — M2550 Pain in unspecified joint: Secondary | ICD-10-CM

## 2017-03-17 DIAGNOSIS — E039 Hypothyroidism, unspecified: Secondary | ICD-10-CM | POA: Diagnosis not present

## 2017-03-17 DIAGNOSIS — Z7982 Long term (current) use of aspirin: Secondary | ICD-10-CM | POA: Diagnosis not present

## 2017-03-17 NOTE — Progress Notes (Signed)
[facility] day:  03/17/2017   Chief Complaint: Kim Santiago is a 82 y.o. female with stage IIB left breast cancer who is seen for 3 month assessment on Femara.  HPI:  The patient was last seen in the medical oncology clinic on 12/25/2016.  At that time, she denied any breast concerns.  Exam revealed a stable breast exam and cutaneous lupus.  CA27.29 was 45.8 (elevated).  PET scan revealed slightly enlarges right lower paratracheal lymph nodes and some clustered small AP window lymph nodes with metabolic activity very minimally above background (likely inflammatory).  She continued Femara.  We discussed follow-up chest CT.  Chest CT on 03/16/2017 revealed stable mild mediastinal lymphadenopathy.  Largest node was 1.0 cm in the right paratracheal region.  There was no new or progressive disease within thorax.  Symptomatically, she denies any breast concerns.  She states that her rash is getting better than it was in 12/2016.  She is on weekly methotrexate.    Past Medical History:  Diagnosis Date  . Arthritis   . Breast cancer ([place]) 2017   left mastectomy done 11/2015  . Breast cancer in female [facility]) 11/18/2015   Left: 3.9 cm tumor, T2, 1/2 sentinel nodes positive for macro metastatic disease, N1, 3 negative nodes in the axillary tail, ER+,PR+, Her 2 neu, low Mammoprint score  . Cancer [facility])    thyroid takes levothyroxine  . Chronic kidney disease    UTI  . Genetic screening March 2017.   Mammoprint of left breast cancer: Low risk for recurrence.   . Hypertension   . hypothyroidism    secondary to thyroidectomy for thyroid ca  . Hypothyroidism   . Lupus   . Menopause 40s   natural, hot flashes and mood lability now gone, off prempro 7 months  . Myocardial infarction ([place]) 2013  . Osteoporosis    Osteopenia  . Rosacea   . Sjoegren syndrome ([place])   . Stress-induced cardiomyopathy September of 2013   EF 35%. Peak troponin was 1.8.     Past Surgical History:  Procedure Laterality Date  . BACK SURGERY    . BREAST BIOPSY Left 10/30/15   positive, done in Dr. Dwyane Luo office  . CARDIAC CATHETERIZATION  05/2012   [facility]. No significant CAD. Ejection fraction of 35% due to stress-induced cardiomyopathy.  . CHOLECYSTECTOMY    . COLONOSCOPY    . DILATION AND CURETTAGE OF UTERUS    . KYPHOSIS SURGERY  Feb 2008   L1, Dr. Mauri Pole  . LUMBAR DISC SURGERY     L4-L5  . MASTECTOMY Left 11/18/2015   positive  . SENTINEL NODE BIOPSY Left 11/18/2015   Procedure: SENTINEL NODE BIOPSY;  Surgeon: Robert Bellow, MD;  Location: [facility] ORS;  Service: General;  Laterality: Left;  . SHOULDER ARTHROSCOPY  2004   Left, Dr. Jefm Bryant  . SIMPLE MASTECTOMY WITH AXILLARY SENTINEL NODE BIOPSY Left 11/18/2015   Procedure: SIMPLE MASTECTOMY;  Surgeon: Robert Bellow, MD;  Location: [facility] ORS;  Service: General;  Laterality: Left;  . SPINE SURGERY     L4-5 diskectomy  . THYROIDECTOMY     Thyroid Cancer  . TONSILLECTOMY    . TUBAL LIGATION      Family History  Problem Relation Age of Onset  . COPD Father   . Cancer Father        esophageal  . Kidney disease Mother   . Heart disease Mother   . Kidney disease  Sister   . Cancer Brother 70       colon cancer (both brothers)  . Heart attack Brother 38  . Heart disease Brother   . Breast cancer Neg Hx     Social History:  reports that she has never smoked. She has never used smokeless tobacco. She reports that she does not drink alcohol or use drugs.  She is from [place], [place].  She was married for 62 years.  Her husband died 47 months ago.  She lives alone in [place].  She has an adopted daughter and 2 grandchildren.  Her brother died of a MI following a cholecystectomy in 12/2015.  The patient is accompanied by her daughter today.  Allergies:  Allergies  Allergen Reactions  . Amoxicillin Rash  . Codeine Nausea And Vomiting  . Naprosyn [Naproxen] Swelling  . Orudis [Ketoprofen]  Hives  . Plaquenil [Hydroxychloroquine] Hives  . Sulfathiazole Rash    Current Medications: Current Outpatient Prescriptions  Medication Sig Dispense Refill  . acetaminophen (TYLENOL) 650 MG CR tablet Take 650 mg by mouth every 8 (eight) hours as needed for pain.    Marland Kitchen aspirin 81 MG tablet Take 81 mg by mouth daily.    . Calcium Carbonate-Vitamin D (CALCIUM + D) 600-200 MG-UNIT TABS Take 2 tablets by mouth 2 (two) times daily.     . cholecalciferol (VITAMIN D) 1000 UNITS tablet Take 2,000 Units by mouth daily.     . furosemide (LASIX) 20 MG tablet Once daily as needed for fluid retention 90 tablet 1  . letrozole (FEMARA) 2.5 MG tablet Take 1 tablet (2.5 mg total) by mouth daily. 90 tablet 2  . levothyroxine (SYNTHROID, LEVOTHROID) 88 MCG tablet Take 1 tablet (88 mcg total) by mouth daily. 90 tablet 4  . losartan (COZAAR) 25 MG tablet Take 1 tablet (25 mg total) by mouth daily. 90 tablet 4  . methotrexate (RHEUMATREX) 2.5 MG tablet Take 2.5 mg by mouth once a week. Caution:Chemotherapy. Protect from light.    . metoprolol succinate (TOPROL XL) 25 MG 24 hr tablet Take 1 tablet (25 mg total) by mouth daily. 90 tablet 4  . mirtazapine (REMERON) 7.5 MG tablet Take 1 tablet (7.5 mg total) by mouth at bedtime. 90 tablet 1  . omeprazole (PRILOSEC) 20 MG capsule Take 1 capsule (20 mg total) by mouth 2 (two) times daily before a meal. 180 capsule 3  . polyethylene glycol (MIRALAX / GLYCOLAX) packet Take 17 g by mouth daily.    Marland Kitchen tiZANidine (ZANAFLEX) 2 MG tablet Take 1 tablet (2 mg total) by mouth every 6 (six) hours as needed (for jaw pain). 90 tablet 0  . triamcinolone cream (KENALOG) 0.1 % Apply 1 application topically 2 (two) times daily. 30 g 0  . vitamin B-12 (CYANOCOBALAMIN) 1000 MCG tablet Take 1,000 mcg by mouth daily.     No current facility-administered medications for this visit.     Review of Systems:  GENERAL:  Feels "fine".  No fevers or sweats.  Weight up 8 pounds. PERFORMANCE  STATUS (ECOG):  1 HEENT:  No visual changes, runny nose, sore throat, mouth sores or tenderness. Lungs: No shortness of breath or cough.  No hemoptysis. Cardiac:  No chest pain, palpitations, orthopnea, or PND. GI:  No nausea, vomiting, diarrhea, constipation, melena or hematochezia. GU:  No urgency, frequency, dysuria, or hematuria. Musculoskeletal:  No back pain.  Little joint pain.  No muscle tenderness. Extremities:  No pain or swelling. Skin:  Hair thinning.  Cutaneous lupus, improving. Neuro:  No headache, numbness or weakness, balance or coordination issues. Endocrine:  No diabetes.  Thyroid disease on Synthroid.  Few hot flashes.  No night sweats. Psych:   No mood changes, anxiety or depression.  Pain:  No focal pain. Review of systems:  All other systems reviewed and found to be negative.  Physical Exam: Blood pressure 131/78, pulse 73, temperature 97.6 F (36.4 C), resp. rate 18, weight 144 lb 6.4 oz (65.5 kg). GENERAL:  Well developed, well nourished, woman sitting comfortably in the exam room in no acute distress. MENTAL STATUS:  Alert and oriented to person, place and time. HEAD:  Short gray hair.  Normocephalic, atraumatic, face symmetric, no Cushingoid features. EYES:  Blue rimmed glasses.  Blue eyes.  Pupils equal round and reactive to light and accomodation. No conjunctivitis or scleral icterus. ENT: Oropharynx clear without lesion. Tongue normal. Mucous membranes moist.  RESPIRATORY: Clear to auscultation without rales, wheezes or rhonchi. CARDIOVASCULAR: Regular rate and rhythm without murmur, rub or gallop. No JVD. BREASTS:  Left mastectomy without erythema or nodularity.  Tender right sided fibrocystic changes.  Right breast without masses, skin changes or nipple discharge. ABDOMEN: Soft, non-tender, with active bowel sounds, and no hepatosplenomegaly. SKIN: Rash with some excoriations secondary to lupus, improved.  EXTREMITIES: No edema, no skin discoloration  or tenderness. No palpable cords. LYMPH NODES:  No palpable cervical, supraclavicular, axillary or inguinal adenopathy  NEUROLOGICAL: Unremarkable. PSYCH: Appropriate.  Labs: No visits with results within 3 Day(s) from this visit.  Latest known visit with results is:  Lab on 02/03/2017  Component Date Value Ref Range Status  . TSH 02/03/2017 1.70  0.35 - 4.50 uIU/mL Final  . Sodium 02/03/2017 138  135 - 145 mEq/L Final  . Potassium 02/03/2017 4.4  3.5 - 5.1 mEq/L Final  . Chloride 02/03/2017 103  96 - 112 mEq/L Final  . CO2 02/03/2017 32  19 - 32 mEq/L Final  . Glucose, Bld 02/03/2017 100* 70 - 99 mg/dL Final  . BUN 02/03/2017 17  6 - 23 mg/dL Final  . Creatinine, Ser 02/03/2017 0.85  0.40 - 1.20 mg/dL Final  . Total Bilirubin 02/03/2017 0.5  0.2 - 1.2 mg/dL Final  . Alkaline Phosphatase 02/03/2017 83  39 - 117 U/L Final  . AST 02/03/2017 13  0 - 37 U/L Final  . ALT 02/03/2017 4  0 - 35 U/L Final  . Total Protein 02/03/2017 6.7  6.0 - 8.3 g/dL Final  . Albumin 02/03/2017 3.8  3.5 - 5.2 g/dL Final  . Calcium 02/03/2017 9.1  8.4 - 10.5 mg/dL Final  . GFR 02/03/2017 68.38  >60.00 mL/min Final  . WBC 02/03/2017 3.7* 4.0 - 10.5 K/uL Final  . RBC 02/03/2017 3.33* 3.87 - 5.11 Mil/uL Final  . Hemoglobin 02/03/2017 11.0* 12.0 - 15.0 g/dL Final  . HCT 02/03/2017 31.6* 36.0 - 46.0 % Final  . MCV 02/03/2017 94.9  78.0 - 100.0 fl Final  . MCHC 02/03/2017 34.8  30.0 - 36.0 g/dL Final  . RDW 02/03/2017 16.3* 11.5 - 15.5 % Final  . Platelets 02/03/2017 169.0  150.0 - 400.0 K/uL Final  . Neutrophils Relative % 02/03/2017 50.3  43.0 - 77.0 % Final  . Lymphocytes Relative 02/03/2017 32.0  12.0 - 46.0 % Final  . Monocytes Relative 02/03/2017 10.5  3.0 - 12.0 % Final  . Eosinophils Relative 02/03/2017 6.1* 0.0 - 5.0 % Final  . Basophils Relative 02/03/2017 1.1  0.0 - 3.0 %  Final  . Neutro Abs 02/03/2017 1.8  1.4 - 7.7 K/uL Final  . Lymphs Abs 02/03/2017 1.2  0.7 - 4.0 K/uL Final  . Monocytes  Absolute 02/03/2017 0.4  0.1 - 1.0 K/uL Final  . Eosinophils Absolute 02/03/2017 0.2  0.0 - 0.7 K/uL Final  . Basophils Absolute 02/03/2017 0.0  0.0 - 0.1 K/uL Final    No visits with results within 3 Day(s) from this visit. Latest known visit with results is:  Admission on 11/18/2015, Discharged on 11/18/2015  Component Date Value Ref Range Status  . SURGICAL PATHOLOGY 11/18/2015    Final-Edited                   Value:Surgical Pathology THIS IS AN ADDENDUM REPORT CASE: [id number] PATIENT: [facility] Surgical Pathology Report Addendum  Reason for Addendum #1:  Molecular studies  SPECIMEN SUBMITTED: A. Breast, left B. Sentinel lymph node #1  #2  CLINICAL HISTORY: None provided  PRE-OPERATIVE DIAGNOSIS: Left breast cancer  POST-OPERATIVE DIAGNOSIS: Same as pre-op     DIAGNOSIS: A. LEFT BREAST; TOTAL MASTECTOMY: - SOLITARY INVASIVE MAMMARY CARCINOMA OF NO SPECIAL TYPE, 3.9 CM. - DUCTAL CARCINOMA IN SITU. - LYMPH VASCULAR AND DERMAL LYMPHATIC INVASION IDENTIFIED. - THE SURGICAL MARGINS ARE NEGATIVE. - MICROCALCIFICATIONS IN BENIGN AND MALIGNANT TISSUE. - THREE BENIGN AXILLARY LYMPH NODES (0/3). - SEE SUMMARY BELOW.  B. SENTINEL LYMPH NODE #1 AND 2; EXCISION: - ONE OF TWO LYMPH NODES POSITIVE FOR MACRO METASTASES (1/2).   INVASIVE CARCINOMA OF THE BREAST: Complete Excision Specimens InvolvedA: Breast, left B: Sentinel lymph nod                         e #1  #2  SPECIMEN Procedure:     Total mastectomy (including nipple and skin) Lymph Node Sampling:     Sentinel lymph node(s) Specimen Laterality:     Left TUMOR Presence of Invasive Carcinoma:    Histologic Type: Invasive mammary carcinoma of no special type (ductal, not otherwise specified) Histologic Grade:   Glandular (acinar) / tubular differentiation: Score 3 (< 10% of tumor area forming glandular / tubular structures) Nuclear pleomorphism: Score 2 (cells larger than normal with open vesicular  nuclei, visible nucleoli, and moderate variability in both size and shape) Mitotic rate:  Score 1 (<=3 mitoses per mm2) Overall grade: Grade 2 (scores of 6 or 7) Ductal Carcinoma In Situ (DCIS):   DCIS is present Negative for extensive intraductal component (EIC) Tumor Size / Focality:   Tumor size: size of largest invasive carcinoma: Greatest dimension of largest focus of invasion > 1 mm: 59m Tumor focality:     Single focus of invasive carcinoma Tumor Extent Macr                         oscopic and Microscopic Extent of Tumor Skin:     Skin invasion: Invasive carcinoma directly invades into the dermis or epidermis without skin ulceration (this does not change the T stage) Skin satellite foci:     Negative for satellite foci Nipple DCIS:   DCIS does not involve the nipple epidermis Skeletal muscle:    Specimen does not contain skeletal muscle Accessory Tumor Findings Lymph-Vascular Invasion: Present Treatment Effect: Response to Presurgical (Neoadjuvant) Therapy: No known presurgical therapy MARGINS Invasive Carcinoma: Margins negative for invasive carcinoma Distance from closest margin: Distance is > 10 mm Closest Negative Margin(s):   Posterior Ductal Carcinoma In Situ (DCIS):  Margins negative for DCIS (DCIS present in specimen) Distance of DCIS from closest margin:   Distance is > 10 mm Closest Negative Margin(s):   Posterior LYMPH NODES Regional Lymph Nodes:    Number of Sentinel Nodes Examined: Specify number 2 Number of lymph node(s) exam                         ined:  Number 5 Lymph node involvement:  Number of lymph nodes with macrometastases (> 2 mm): Number 1 Number of lymph nodes with micrometastases: Number 0 Number of lymph nodes with isolated tumor cells (<= 0.2 mm and <= 200 cells):   Number 0 STAGE (pTNM) TNM Descriptors:    Not applicable n/a Primary Tumor (Invasive Carcinoma) (pT):     pT2:  tumor > 20 mm but <= 50 mm in greatest  dimension Regional lymph nodes (pN) (choose a category based on lymph nodes received with the specimen;  immunohistochemistry and / or molecular studies are not required) Category (pN): pN1a: metastases in 1 to 3 axillary lymph nodes, at least 1 metastasis greater than 2 mm Distant Metastasis (pM): Not applicable - pM cannot be determined from the submitted specimen(s) Comment(s): Invasive carcinoma is identified in tissue between the two masses. The tumor is entirely submitted and is present in 13 slides.   GROSS DESCRIPTION: A. Labeled: Left breast  Time in fixative: 3:10                          PM on 11/18/2015  Cold ischemic time: 40 minutes  Total fixation time: 6 1/2 Hours  Type of mastectomy: Simple  Laterality: Left  Weight of specimen: 707 grams  Size of specimen: 23.5 x 20.5 x 3.9 cm  Orientation of specimen: The letters M and L are written in surgical marker on the skin surface to designate the medial and lateral aspects specimen. Anterior half specimen inked blue, posterior specimen inked black.  Skin ellipse dimensions  description: 15.5 x 11.2 cm, unremarkable  Nipple/ areola: Nipple 1 cm in diameter, flattened. Areola 4.3 cm in diameter, unremarkable  Axillary tail: Absent  Biopsy site(s): 2 discrete biopsy sites identified  Number of discrete masses: 2  Location of mass(es): One mass is located approximately at the 6:00 position behind the nipple areolar complex, and the other is located in the approximate 1:00 position in the upper outer quadrant.  Distance between masses: Approximately 1.8 cm  Size of mass(es)/biopsy site(s):                          First mass approximately 1 x 0.9 x 0.5 cm, and second mass approximately 1 x 0.7 x 0.8 cm  Description of mass(es)/biopsy site(s): The first mass is surrounded by a large amount of fibrous tissue measures approximately 2.5 x 2.5 x 1.5 cm. Both the first and second masses are extremely poorly  defined, stellate, pale tan to white.  Margins: The first mass is grossly located 3.5 cm from the deep margin and the second mass is grossly located 3 cm from the deep margin  Gross involvement of skin/fascia/muscle by tumor: Absent  Description of remaining breast: The rest the breast tissue is predominantly fatty with interspersed areas of dense white fibrous tissue  Lymph nodes: On dissection 3 lymph node candidates identified from 0.5 cm to 1.2 x 0.8 x 0.5 cm.  Block Summary: 1-skin 2-nipple 3-deep  margin of resection 4-9 entire first mass including biopsy cavity and adjacent fibrous tissue at the left breast mass 10-12-entire second mass 13-16-breast tissue between                          first and second mass 17-representative section upper inner quadrant 18-representative section lower inner quadrant 19-representative section upper outer quadrant 20-representative section lower outer quadrant 21- 2 lymph node candidates submitted on 11/20/15 22-1 sectioned lymph node candidate submitted on 11/20/15  B. Labeled: Sentinel node #1 and 2  Tissue fragment(s): 2  Size: 0.4 x 0.3 x 0.2 cm and 1.2 x 0.6 x 0.6 cm  Description: 2 pink red lymph node candidates covered by adipose tissue, smaller inked blue  Entirely submitted in one cassette(s).         Final Diagnosis performed by Delorse Lek, MD.  Electronically signed 11/21/2015 2:59:43PM   The electronic signature indicates that the named Attending Pathologist has evaluated the specimen  Technical component performed at [facility], [address], [place], [place] [address] Lab: [phone] Dir: Darrick Penna. Evette Doffing, MD  Professional component performed at [facility], [facility],                          [place], [place], [place] [address] Lab: [phone] Dir: Dellia Nims. Reuel Derby, MD   ADDENDUM: At Dr. Kem Parkinson request, FFPE tumor tissue (block A8) was submitted to [place], [place], for MammaPrint testing, a 70-gene expression profile. The assay result is reported as Low Risk, +0.192. Please see separate report, requisition number [id number], in [place].    Addendum #1 performed by Bryan Lemma, MD.  Electronically signed 12/24/2015 12:25:01PM    Technical component performed at [facility], [address] [place], [place] [address] Lab: [phone] Dir: Darrick Penna. Evette Doffing, MD  Professional component performed at [facility], [facility], [place], [place], [place] [address] Lab: [phone] Dir: Dellia Nims. Rubinas, MD      Assessment:  Kim Santiago is a 82 y.o. female with stage IIB left breast cancer s/p mastectomy on 11/18/2015.  Pathology revealed a 3.9 cm grade II invasive mammary carcinoma.  There was ductal carcinoma in situ (DCIS).  There was lymphvascular and dermal lymphatic invasion.  Margins were negative.  One of 2 sentinel lymph nodes were positive for macrometastasis.  In addition, there were 3 benign axillary lymph nodes (total 5 lymph nodes).  Tumor was estrogen receptor positive (100%), progesterone receptor positive (70%), and Her2/neu negative.  Pathologic stage was T2N1a.   Bilateral mammogram and ultrasound on 11/11/2015 revealed a 3 cm left breast mass at the 6 o'clock position (1 cm from the nipple) and a 1.6 cm irregular hypoechoic left breast mass at 1 o'clock position (5 cm from the nipple) with overlying skin thickening.  Right mammogram on 11/18/2016 revealed no evidence of malignancy.  MammaPrint on 12/18/2015 revealed a low risk of recurrence with a 5% risk of distant recurrence of 5 years and a 10% recurrence at 10 years.  She declined radiation.  She began Femara on 01/09/2016.  She has a little joint pain and a few hot flashes.  PET scan on 12/23/2016 revealed slightly enlarged right lower paratracheal lymph node and some clustered small AP window lymph nodes with metabolic activity very minimally above  background in the mediastinum.  There were no other findings of hypermetabolic lesions in the neck, chest, abdomen, or pelvis.  Chest CT on 03/16/2017 revealed stable mild mediastinal lymphadenopathy.  Largest node was 1.0 cm in the right paratracheal region.  There was no new or progressive disease within thorax.  CA27.29 has been followed: 30.4 on 11/15/2015, 32.5 on 05/13/2016, 19.0 on 08/12/2016, 40.8 on 11/11/2016, 45.8 on 12/14/2016, and 31.6 on 03/17/2017.  Bone density study on 12/05/2015 revealed osteopenia with a T-score of -1.5 in the left femoral neck.  She is on calcium and vitamin D.  She has a history of a myocardial infarction in 06/2012.  Echocardiogram revealed an EF of 35%.  Follow-up echo on 09/21/2015 revealed an EF of 55-60%.  Patient is unaware of a history of heart failure.   She has a history of thyroid carcinoma 40 years ago treated with I-131.   She has a mild normocytic anemia.  Diet is good.  Colonoscopy on 07/25/2013 revealed 3 small polyps (22m proximal descending colon, 3 mm splenic flexure, 1 mm ascending colon).  She denies any melena or hematochezia.  TSH was normal on 12/26/2015.  She has cutaneous lupus and Sjogren's.  She had a reaction to Plaquenil.  She completed an extended steroid taper.   Symptomatically, she denies any breast concerns.  Exam reveals a stable breast exam and cutaneous lupus.  Plan: 1.  Labs today:  CBC with diff, CMP, CA27.29. 2.  Review interval chest CT.  No change in mediastinal adenopathy.  Suspect reactive/inflammatory.  Anticipate follow-up imaging in 6 months. 3.  Continue Femara. 4.  RTC in 4 months for MD assessment and labs (CBC with diff, CMP, CA27.29).   MLequita Asal MD  03/17/2017 , 11:07 AM

## 2017-03-17 NOTE — Progress Notes (Signed)
1 

## 2017-03-17 NOTE — Progress Notes (Signed)
Patient offers no concerns today.  Here today for CT results.

## 2017-03-18 ENCOUNTER — Telehealth: Payer: Self-pay | Admitting: *Deleted

## 2017-03-18 LAB — CANCER ANTIGEN 27.29: CA 27.29: 31.6 U/mL (ref 0.0–38.6)

## 2017-03-18 NOTE — Telephone Encounter (Signed)
Called patient to inform her that CA 27.29 is good.

## 2017-03-18 NOTE — Telephone Encounter (Signed)
-----   Message from Lequita Asal, MD sent at 03/18/2017  5:21 AM EDT ----- Regarding: Please call patient with CA27.29  Good.   ----- Message ----- From: Interface, Lab In [place] Sent: 03/18/2017   4:38 AM To: Lequita Asal, MD

## 2017-03-30 ENCOUNTER — Ambulatory Visit (INDEPENDENT_AMBULATORY_CARE_PROVIDER_SITE_OTHER): Payer: PPO

## 2017-03-30 VITALS — BP 128/70 | HR 85 | Temp 98.1°F | Resp 14 | Ht 64.0 in | Wt 144.4 lb

## 2017-03-30 DIAGNOSIS — Z0000 Encounter for general adult medical examination without abnormal findings: Secondary | ICD-10-CM | POA: Diagnosis not present

## 2017-03-30 NOTE — Patient Instructions (Addendum)
  Kim Santiago , Thank you for taking time to come for your Medicare Wellness Visit. I appreciate your ongoing commitment to your health goals. Please review the following plan we discussed and let me know if I can assist you in the future.   Follow up with Dr. Derrel Nip as needed.    Have a great day!  These are the goals we discussed: Goals    . Increase physical activity    . Maintain healthy diet          Maintain healthy diet and drink plenty of water       This is a list of the screening recommended for you and due dates:  Health Maintenance  Topic Date Due  . Flu Shot  04/07/2017  . Tetanus Vaccine  01/06/2023  . DEXA scan (bone density measurement)  Completed  . Pneumonia vaccines  Completed

## 2017-03-30 NOTE — Progress Notes (Signed)
Subjective:   Kim Santiago is a 82 y.o. female who presents for Medicare Annual (Subsequent) preventive examination.  Review of Systems:  No ROS.  Medicare Wellness Visit. Additional risk factors are reflected in the social history.  Cardiac Risk Factors include: advanced age (>66men, >69 women)     Objective:     Vitals: BP 128/70 (BP Location: Right Arm, Patient Position: Sitting, Cuff Size: Normal)   Pulse 85   Temp 98.1 F (36.7 C) (Oral)   Resp 14   Ht 5\' 4"  (1.626 m)   Wt 144 lb 6.4 oz (65.5 kg)   SpO2 100%   BMI 24.79 kg/m   Body mass index is 24.79 kg/m.   Tobacco History  Smoking Status  . Never Smoker  Smokeless Tobacco  . Never Used     Counseling given: Not Answered   Past Medical History:  Diagnosis Date  . Arthritis   . Breast cancer ([place]) 2017   left mastectomy done 11/2015  . Breast cancer in female [facility]) 11/18/2015   Left: 3.9 cm tumor, T2, 1/2 sentinel nodes positive for macro metastatic disease, N1, 3 negative nodes in the axillary tail, ER+,PR+, Her 2 neu, low Mammoprint score  . Cancer [facility])    thyroid takes levothyroxine  . Chronic kidney disease    UTI  . Genetic screening March 2017.   Mammoprint of left breast cancer: Low risk for recurrence.   . Hypertension   . hypothyroidism    secondary to thyroidectomy for thyroid ca  . Hypothyroidism   . Lupus   . Menopause 40s   natural, hot flashes and mood lability now gone, off prempro 7 months  . Myocardial infarction ([place]) 2013  . Osteoporosis    Osteopenia  . Rosacea   . Sjoegren syndrome ([place])   . Stress-induced cardiomyopathy September of 2013   EF 35%. Peak troponin was 1.8.   Past Surgical History:  Procedure Laterality Date  . BACK SURGERY    . BREAST BIOPSY Left 10/30/15   positive, done in Dr. Dwyane Luo office  . CARDIAC CATHETERIZATION  05/2012   [facility]. No significant CAD. Ejection fraction of 35% due to stress-induced cardiomyopathy.  . CHOLECYSTECTOMY    .  COLONOSCOPY    . DILATION AND CURETTAGE OF UTERUS    . KYPHOSIS SURGERY  Feb 2008   L1, Dr. Mauri Pole  . LUMBAR DISC SURGERY     L4-L5  . MASTECTOMY Left 11/18/2015   positive  . SENTINEL NODE BIOPSY Left 11/18/2015   Procedure: SENTINEL NODE BIOPSY;  Surgeon: Robert Bellow, MD;  Location: [facility] ORS;  Service: General;  Laterality: Left;  . SHOULDER ARTHROSCOPY  2004   Left, Dr. Jefm Bryant  . SIMPLE MASTECTOMY WITH AXILLARY SENTINEL NODE BIOPSY Left 11/18/2015   Procedure: SIMPLE MASTECTOMY;  Surgeon: Robert Bellow, MD;  Location: [facility] ORS;  Service: General;  Laterality: Left;  . SPINE SURGERY     L4-5 diskectomy  . THYROIDECTOMY     Thyroid Cancer  . TONSILLECTOMY    . TUBAL LIGATION     Family History  Problem Relation Age of Onset  . COPD Father   . Cancer Father        esophageal  . Kidney disease Mother   . Heart disease Mother   . Kidney disease Sister   . Cancer Brother 82       colon cancer (both brothers)  . Heart attack Brother 81  . Heart disease Brother   .  Breast cancer Neg Hx    History  Sexual Activity  . Sexual activity: No    Outpatient Encounter Prescriptions as of 03/30/2017  Medication Sig  . acetaminophen (TYLENOL) 650 MG CR tablet Take 650 mg by mouth every 8 (eight) hours as needed for pain.  Marland Kitchen aspirin 81 MG tablet Take 81 mg by mouth daily.  Marland Kitchen Bioflavonoid Products (BIOFLEX PO) Take 1 capsule by mouth.  . Calcium Carbonate-Vitamin D (CALCIUM + D) 600-200 MG-UNIT TABS Take 2 tablets by mouth 2 (two) times daily.   . cholecalciferol (VITAMIN D) 1000 UNITS tablet Take 2,000 Units by mouth daily.   . furosemide (LASIX) 20 MG tablet Once daily as needed for fluid retention  . letrozole (FEMARA) 2.5 MG tablet Take 1 tablet (2.5 mg total) by mouth daily.  Marland Kitchen levothyroxine (SYNTHROID, LEVOTHROID) 88 MCG tablet Take 1 tablet (88 mcg total) by mouth daily.  Marland Kitchen losartan (COZAAR) 25 MG tablet Take 1 tablet (25 mg total) by mouth daily.  . methotrexate  (RHEUMATREX) 2.5 MG tablet Take 2.5 mg by mouth once a week. Caution:Chemotherapy. Protect from light.  . metoprolol succinate (TOPROL XL) 25 MG 24 hr tablet Take 1 tablet (25 mg total) by mouth daily.  . mirtazapine (REMERON) 7.5 MG tablet Take 1 tablet (7.5 mg total) by mouth at bedtime.  Marland Kitchen omeprazole (PRILOSEC) 20 MG capsule Take 1 capsule (20 mg total) by mouth 2 (two) times daily before a meal.  . polyethylene glycol (MIRALAX / GLYCOLAX) packet Take 17 g by mouth daily.  Marland Kitchen tiZANidine (ZANAFLEX) 2 MG tablet Take 1 tablet (2 mg total) by mouth every 6 (six) hours as needed (for jaw pain).  . triamcinolone cream (KENALOG) 0.1 % Apply 1 application topically 2 (two) times daily.  . vitamin B-12 (CYANOCOBALAMIN) 1000 MCG tablet Take 1,000 mcg by mouth daily.   No facility-administered encounter medications on file as of 03/30/2017.     Activities of Daily Living In your present state of health, do you have any difficulty performing the following activities: 03/30/2017 03/30/2016  Hearing? N Y  Vision? N Y  Difficulty concentrating or making decisions? N Y  Walking or climbing stairs? N N  Dressing or bathing? N N  Doing errands, shopping? N N  Preparing Food and eating ? N -  Using the Toilet? N -  In the past six months, have you accidently leaked urine? N -  Do you have problems with loss of bowel control? N -  Managing your Medications? N -  Managing your Finances? N -  Housekeeping or managing your Housekeeping? N -  Some recent data might be hidden    Patient Care Team: [name], MD as PCP - General (Internal Medicine) [name], MD (Internal Medicine) Bary Castilla Forest Gleason, MD (General Surgery) Oneta Rack, MD (Dermatology)    Assessment:   This is a routine wellness examination for Kim Santiago. The goal of the wellness visit is to assist the patient how to close the gaps in care and create a preventative care plan for the patient.   The roster of all  physicians providing medical care to patient is listed in the Snapshot section of the chart.  Taking calcium VIT D as appropriate/Osteoporosis risk reviewed.    Safety issues reviewed; Smoke and carbon monoxide detectors in the home. No firearms in the home.  Wears seatbelts when driving or riding with others. Patient does wear sunscreen or protective clothing when in direct sunlight. No violence  in the home.  Depression- PHQ 2 &9 complete.  No signs/symptoms or verbal communication regarding little pleasure in doing things, feeling down, depressed or hopeless. No changes in sleeping, energy, eating, concentrating.  No thoughts of self harm or harm towards others.  Time spent on this topic is 8 minutes.   Patient is alert, normal appearance, oriented to person/place/and time.  Correctly identified the president of the [place], recall of 3/3 words, and performing simple calculations. Displays appropriate judgement and can read correct time from watch face.   No new identified risk were noted.  No failures at ADL's or IADL's.    BMI- discussed the importance of a healthy diet, water intake and the benefits of aerobic exercise. Educational material provided.   24 hour diet recall: Breakfast: cereal, fruit ,nuts Lunch: personal size of pizza and salad Dinner:none Snack: cookie Daily fluid intake: 1 cups of caffeine, 8 cups of water  Dental- every 6 months.  Dr. Phillis Knack ([place])  Eye- Visual acuity not assessed per patient preference since they have regular follow up with the ophthalmologist.  Wears corrective lenses.  Sleep patterns- Sleeps 7-8 hours at night.  Wakes feeling rested.  Patient Concerns: None at this time. Follow up with PCP as needed.  Exercise Activities and Dietary recommendations Current Exercise Habits: Home exercise routine, Type of exercise: walking, Frequency (Times/Week): 3, Intensity: Moderate  Goals    . Increase physical activity    . Maintain healthy  diet          Maintain healthy diet and drink plenty of water      Fall Risk Fall Risk  03/30/2017 03/30/2016 08/12/2015 11/06/2014 11/05/2014  Falls in the past year? No No No Yes Yes  Number falls in past yr: - - - 1 2 or more  Injury with Fall? - - - Yes Yes  Risk for fall due to : - - - - Other (Comment)  Follow up - - - Education provided;Falls prevention discussed Falls prevention discussed   Depression Screen PHQ 2/9 Scores 03/30/2017 01/08/2017 03/30/2016 11/06/2014  PHQ - 2 Score 0 2 2 0  PHQ- 9 Score - 5 5 -     Cognitive Function MMSE - Mini Mental State Exam 03/30/2017 08/12/2015  Orientation to time 5 5  Orientation to Place 5 5  Registration 3 3  Attention/ Calculation 5 5  Recall 3 3  Language- name 2 objects 2 2  Language- repeat 1 1  Language- follow 3 step command 3 3  Language- read & follow direction 1 1  Write a sentence 1 1  Copy design 1 1  Total score 30 30        Immunization History  Administered Date(s) Administered  . Influenza Split 07/08/2012  . Influenza, High Dose Seasonal PF 06/08/2016  . Influenza,inj,Quad PF,36+ Mos 06/08/2013, 05/04/2014, 05/06/2015  . Pneumococcal Conjugate-13 03/06/2014  . Pneumococcal-Unspecified 10/11/2007  . Tdap 01/05/2013  . Zoster 01/12/2012   Screening Tests Health Maintenance  Topic Date Due  . INFLUENZA VACCINE  04/07/2017  . TETANUS/TDAP  01/06/2023  . DEXA SCAN  Completed  . PNA vac Low Risk Adult  Completed      Plan:    End of life planning; Advance aging; Advanced directives discussed. Copy of current HCPOA/Living Will on file.    I have personally reviewed and noted the following in the patient's chart:   . Medical and social history . Use of alcohol, tobacco or illicit drugs  . Current medications  and supplements . Functional ability and status . Nutritional status . Physical activity . Advanced directives . List of other physicians . Hospitalizations, surgeries, and ER visits in previous  12 months . Vitals . Screenings to include cognitive, depression, and falls . Referrals and appointments  In addition, I have reviewed and discussed with patient certain preventive protocols, quality metrics, and best practice recommendations. A written personalized care plan for preventive services as well as general preventive health recommendations were provided to patient.     OBrien-Blaney, Kwali Wrinkle L, LPN  10/22/8725     I have reviewed the above information and agree with above.   Deborra Medina, MD

## 2017-04-30 ENCOUNTER — Telehealth: Payer: Self-pay | Admitting: *Deleted

## 2017-04-30 ENCOUNTER — Ambulatory Visit (INDEPENDENT_AMBULATORY_CARE_PROVIDER_SITE_OTHER): Payer: PPO | Admitting: *Deleted

## 2017-04-30 DIAGNOSIS — R300 Dysuria: Secondary | ICD-10-CM

## 2017-04-30 LAB — POCT URINALYSIS DIPSTICK
BILIRUBIN UA: NEGATIVE
GLUCOSE UA: NEGATIVE
KETONES UA: NEGATIVE
Nitrite, UA: NEGATIVE
Protein, UA: NEGATIVE
SPEC GRAV UA: 1.01 (ref 1.010–1.025)
Urobilinogen, UA: 0.2 E.U./dL
pH, UA: 6 (ref 5.0–8.0)

## 2017-04-30 LAB — URINALYSIS, MICROSCOPIC ONLY: RBC / HPF: NONE SEEN (ref 0–?)

## 2017-04-30 NOTE — Telephone Encounter (Signed)
Pt thinks that she may have a UTi. Pt has urgency of urination and burning  Pt would like to come in to be tested  Pt contact [phone]

## 2017-04-30 NOTE — Progress Notes (Signed)
Started this morning having pressure and frequency,slight burning,was having often early this morning, but not as much now. No fever or chills , no nausea just pressure and burning

## 2017-04-30 NOTE — Telephone Encounter (Signed)
Started this morning having pressure and frequency,slight burning,was having often early this morning, but not as much now. No fever or chills , no nausea just pressure and burning. Added patient to nurse schedule for POCT UA, Micro only , culture ok ?

## 2017-04-30 NOTE — Telephone Encounter (Signed)
Yes, thanks

## 2017-05-01 LAB — URINE CULTURE: Organism ID, Bacteria: NO GROWTH

## 2017-05-02 ENCOUNTER — Encounter: Payer: Self-pay | Admitting: Internal Medicine

## 2017-05-04 ENCOUNTER — Encounter: Payer: Self-pay | Admitting: Internal Medicine

## 2017-05-11 ENCOUNTER — Ambulatory Visit (INDEPENDENT_AMBULATORY_CARE_PROVIDER_SITE_OTHER): Payer: PPO | Admitting: Family

## 2017-05-11 ENCOUNTER — Other Ambulatory Visit (HOSPITAL_COMMUNITY)
Admission: RE | Admit: 2017-05-11 | Discharge: 2017-05-11 | Disposition: A | Discharge status: Home / Self Care | Payer: PPO | Source: Ambulatory Visit | Attending: Family | Admitting: Family

## 2017-05-11 ENCOUNTER — Encounter: Payer: Self-pay | Admitting: Family

## 2017-05-11 VITALS — BP 134/80 | HR 75 | Temp 98.7°F | Ht 64.0 in | Wt 145.4 lb

## 2017-05-11 DIAGNOSIS — N952 Postmenopausal atrophic vaginitis: Secondary | ICD-10-CM | POA: Diagnosis not present

## 2017-05-11 LAB — URINALYSIS, ROUTINE W REFLEX MICROSCOPIC
Bilirubin Urine: NEGATIVE
HGB URINE DIPSTICK: NEGATIVE
KETONES UR: NEGATIVE
LEUKOCYTES UA: NEGATIVE
Nitrite: NEGATIVE
PH: 7 (ref 5.0–8.0)
RBC / HPF: NONE SEEN (ref 0–?)
TOTAL PROTEIN, URINE-UPE24: NEGATIVE
URINE GLUCOSE: NEGATIVE
Urobilinogen, UA: 0.2 (ref 0.0–1.0)

## 2017-05-11 NOTE — Progress Notes (Signed)
Pre visit review using our clinic review tool, if applicable. No additional management support is needed unless otherwise documented below in the visit note. 

## 2017-05-11 NOTE — Patient Instructions (Signed)
Urine studies  Suspect atrophy  Water based lubricant.   Let us know if not better

## 2017-05-11 NOTE — Progress Notes (Signed)
Subjective:    Patient ID: Kim Santiago, female    DOB: August 05, 1935, 82 y.o.   MRN: [id number]  CC: [name] is a 82 y.o. female who presents today for an acute visit.    HPI: CC: urinary frequency, urgency, dysuria for one day, then resolved.   No pelvic pain, vaginal bleeding, hematuria, fever, vomiting.   Vaginal dryness for past 10 years. No longer sexually active.   H/o left breast cancer- on femara  h/o ckd    Urine culture showed no growth 8/24. No blood on mico 8/24  HISTORY:  Past Medical History:  Diagnosis Date  . Arthritis   . Breast cancer ([place]) 2017   left mastectomy done 11/2015  . Breast cancer in female [facility]) 11/18/2015   Left: 3.9 cm tumor, T2, 1/2 sentinel nodes positive for macro metastatic disease, N1, 3 negative nodes in the axillary tail, ER+,PR+, Her 2 neu, low Mammoprint score  . Cancer [facility])    thyroid takes levothyroxine  . Chronic kidney disease    UTI  . Genetic screening March 2017.   Mammoprint of left breast cancer: Low risk for recurrence.   . Hypertension   . hypothyroidism    secondary to thyroidectomy for thyroid ca  . Hypothyroidism   . Lupus   . Menopause 40s   natural, hot flashes and mood lability now gone, off prempro 7 months  . Myocardial infarction ([place]) 2013  . Osteoporosis    Osteopenia  . Rosacea   . Sjoegren syndrome ([place])   . Stress-induced cardiomyopathy September of 2013   EF 35%. Peak troponin was 1.8.   Past Surgical History:  Procedure Laterality Date  . BACK SURGERY    . BREAST BIOPSY Left 10/30/15   positive, done in Dr. Dwyane Luo office  . CARDIAC CATHETERIZATION  05/2012   [facility]. No significant CAD. Ejection fraction of 35% due to stress-induced cardiomyopathy.  . CHOLECYSTECTOMY    . COLONOSCOPY    . DILATION AND CURETTAGE OF UTERUS    . KYPHOSIS SURGERY  Feb 2008   L1, Dr. Mauri Pole  . LUMBAR DISC SURGERY     L4-L5  . MASTECTOMY Left 11/18/2015   positive  . SENTINEL NODE BIOPSY Left  11/18/2015   Procedure: SENTINEL NODE BIOPSY;  Surgeon: Robert Bellow, MD;  Location: [facility] ORS;  Service: General;  Laterality: Left;  . SHOULDER ARTHROSCOPY  2004   Left, Dr. Jefm Bryant  . SIMPLE MASTECTOMY WITH AXILLARY SENTINEL NODE BIOPSY Left 11/18/2015   Procedure: SIMPLE MASTECTOMY;  Surgeon: Robert Bellow, MD;  Location: [facility] ORS;  Service: General;  Laterality: Left;  . SPINE SURGERY     L4-5 diskectomy  . THYROIDECTOMY     Thyroid Cancer  . TONSILLECTOMY    . TUBAL LIGATION     Family History  Problem Relation Age of Onset  . COPD Father   . Cancer Father        esophageal  . Kidney disease Mother   . Heart disease Mother   . Kidney disease Sister   . Cancer Brother 60       colon cancer (both brothers)  . Heart attack Brother 97  . Heart disease Brother   . Breast cancer Neg Hx     Allergies: Amoxicillin; Codeine; Naprosyn [naproxen]; Orudis [ketoprofen]; Plaquenil [hydroxychloroquine]; and Sulfathiazole Current Outpatient Prescriptions on File Prior to Visit  Medication Sig Dispense Refill  . acetaminophen (TYLENOL) 650 MG CR tablet Take 650 mg by mouth  every 8 (eight) hours as needed for pain.    Marland Kitchen aspirin 81 MG tablet Take 81 mg by mouth daily.    Marland Kitchen Bioflavonoid Products (BIOFLEX PO) Take 1 capsule by mouth.    . Calcium Carbonate-Vitamin D (CALCIUM + D) 600-200 MG-UNIT TABS Take 2 tablets by mouth 2 (two) times daily.     . cholecalciferol (VITAMIN D) 1000 UNITS tablet Take 2,000 Units by mouth daily.     . furosemide (LASIX) 20 MG tablet Once daily as needed for fluid retention 90 tablet 1  . letrozole (FEMARA) 2.5 MG tablet Take 1 tablet (2.5 mg total) by mouth daily. 90 tablet 2  . levothyroxine (SYNTHROID, LEVOTHROID) 88 MCG tablet Take 1 tablet (88 mcg total) by mouth daily. 90 tablet 4  . losartan (COZAAR) 25 MG tablet Take 1 tablet (25 mg total) by mouth daily. 90 tablet 4  . methotrexate (RHEUMATREX) 2.5 MG tablet Take 2.5 mg by mouth once a week.  Caution:Chemotherapy. Protect from light.    . metoprolol succinate (TOPROL XL) 25 MG 24 hr tablet Take 1 tablet (25 mg total) by mouth daily. 90 tablet 4  . mirtazapine (REMERON) 7.5 MG tablet Take 1 tablet (7.5 mg total) by mouth at bedtime. 90 tablet 1  . omeprazole (PRILOSEC) 20 MG capsule Take 1 capsule (20 mg total) by mouth 2 (two) times daily before a meal. 180 capsule 3  . polyethylene glycol (MIRALAX / GLYCOLAX) packet Take 17 g by mouth daily.    Marland Kitchen tiZANidine (ZANAFLEX) 2 MG tablet Take 1 tablet (2 mg total) by mouth every 6 (six) hours as needed (for jaw pain). 90 tablet 0  . triamcinolone cream (KENALOG) 0.1 % Apply 1 application topically 2 (two) times daily. 30 g 0  . vitamin B-12 (CYANOCOBALAMIN) 1000 MCG tablet Take 1,000 mcg by mouth daily.     No current facility-administered medications on file prior to visit.     Social History  Substance Use Topics  . Smoking status: Never Smoker  . Smokeless tobacco: Never Used  . Alcohol use No    Review of Systems  Constitutional: Negative for chills and fever.  Respiratory: Negative for cough.   Cardiovascular: Negative for chest pain and palpitations.  Gastrointestinal: Negative for nausea and vomiting.  Genitourinary: Negative for dysuria, flank pain, frequency, pelvic pain, urgency, vaginal bleeding and vaginal discharge.  Musculoskeletal: Negative for back pain.      Objective:    BP 134/80   Pulse 75   Temp 98.7 F (37.1 C) (Oral)   Ht 5\' 4"  (1.626 m)   Wt 145 lb 6.4 oz (66 kg)   SpO2 98%   BMI 24.96 kg/m    Physical Exam  Constitutional: She appears well-developed and well-nourished.  Eyes: Conjunctivae are normal.  Cardiovascular: Normal rate, regular rhythm, normal heart sounds and normal pulses.   Pulmonary/Chest: Effort normal and breath sounds normal. She has no wheezes. She has no rhonchi. She has no rales.  Abdominal: There is no CVA tenderness.  Genitourinary: There is no rash, tenderness or  lesion on the right labia. There is no rash, tenderness or lesion on the left labia. Cervix exhibits no motion tenderness and no discharge. Right adnexum displays no mass, no tenderness and no fullness. Left adnexum displays no mass, no tenderness and no fullness. No erythema, tenderness or bleeding in the vagina. No foreign body in the vagina. No vaginal discharge found.  Genitourinary Comments: No vulvovaginal erythema. Vaginal mucosa dry.  No  lesions. Discharge is thin and clear, not purulent.   Neurological: She is alert.  Skin: Skin is warm and dry.  Psychiatric: She has a normal mood and affect. Her speech is normal and behavior is normal. Thought content normal.  Vitals reviewed.      Assessment & Plan:   Problem List Items Addressed This Visit      Genitourinary   Vaginal atrophy - Primary    History and clinical exam consistent with vaginal atrophy. Srogren's and Femara may also be contributory. Advised water based lubricant. Pending urine studies, wet prep. Advised if no improvement of symptom or new symptoms develop to make a f/u OV. Return precautions given.      Relevant Orders   CULTURE, URINE COMPREHENSIVE   Urinalysis, Routine w reflex microscopic   Cervicovaginal ancillary only        I am having Ms. Salaz maintain her cholecalciferol, Calcium Carbonate-Vitamin D, vitamin B-12, aspirin, polyethylene glycol, triamcinolone cream, acetaminophen, tiZANidine, metoprolol succinate, levothyroxine, losartan, furosemide, omeprazole, methotrexate, mirtazapine, letrozole, and Bioflavonoid Products (BIOFLEX PO).   No orders of the defined types were placed in this encounter.   Return precautions given.   Risks, benefits, and alternatives of the medications and treatment plan prescribed today were discussed, and patient expressed understanding.   Education regarding symptom management and diagnosis given to patient on AVS.  Continue to follow with [name], MD for  routine health maintenance.   Jamaira Elmo Putt and I agreed with plan.   Mable Paris, FNP

## 2017-05-11 NOTE — Assessment & Plan Note (Addendum)
History and clinical exam consistent with vaginal atrophy. Srogren's and Femara may also be contributory. Advised water based lubricant. Pending urine studies, wet prep. Advised if no improvement of symptom or new symptoms develop to make a f/u OV. Return precautions given.

## 2017-05-12 DIAGNOSIS — L931 Subacute cutaneous lupus erythematosus: Secondary | ICD-10-CM | POA: Diagnosis not present

## 2017-05-12 LAB — CERVICOVAGINAL ANCILLARY ONLY: WET PREP (BD AFFIRM): NEGATIVE

## 2017-05-13 LAB — CULTURE, URINE COMPREHENSIVE

## 2017-05-27 DIAGNOSIS — M9908 Segmental and somatic dysfunction of rib cage: Secondary | ICD-10-CM | POA: Diagnosis not present

## 2017-05-27 DIAGNOSIS — M9902 Segmental and somatic dysfunction of thoracic region: Secondary | ICD-10-CM | POA: Diagnosis not present

## 2017-05-27 DIAGNOSIS — M546 Pain in thoracic spine: Secondary | ICD-10-CM | POA: Diagnosis not present

## 2017-05-27 DIAGNOSIS — M9903 Segmental and somatic dysfunction of lumbar region: Secondary | ICD-10-CM | POA: Diagnosis not present

## 2017-05-31 DIAGNOSIS — M9902 Segmental and somatic dysfunction of thoracic region: Secondary | ICD-10-CM | POA: Diagnosis not present

## 2017-05-31 DIAGNOSIS — M9903 Segmental and somatic dysfunction of lumbar region: Secondary | ICD-10-CM | POA: Diagnosis not present

## 2017-05-31 DIAGNOSIS — M546 Pain in thoracic spine: Secondary | ICD-10-CM | POA: Diagnosis not present

## 2017-05-31 DIAGNOSIS — M9908 Segmental and somatic dysfunction of rib cage: Secondary | ICD-10-CM | POA: Diagnosis not present

## 2017-06-11 ENCOUNTER — Encounter: Payer: Self-pay | Admitting: Internal Medicine

## 2017-06-11 ENCOUNTER — Ambulatory Visit (INDEPENDENT_AMBULATORY_CARE_PROVIDER_SITE_OTHER): Payer: PPO | Admitting: Internal Medicine

## 2017-06-11 VITALS — BP 138/68 | HR 82 | Temp 98.3°F | Resp 15 | Ht 64.0 in | Wt 145.2 lb

## 2017-06-11 DIAGNOSIS — M2550 Pain in unspecified joint: Secondary | ICD-10-CM

## 2017-06-11 DIAGNOSIS — M3500 Sicca syndrome, unspecified: Secondary | ICD-10-CM | POA: Diagnosis not present

## 2017-06-11 DIAGNOSIS — Z23 Encounter for immunization: Secondary | ICD-10-CM | POA: Diagnosis not present

## 2017-06-11 DIAGNOSIS — L930 Discoid lupus erythematosus: Secondary | ICD-10-CM

## 2017-06-11 DIAGNOSIS — E032 Hypothyroidism due to medicaments and other exogenous substances: Secondary | ICD-10-CM | POA: Diagnosis not present

## 2017-06-11 DIAGNOSIS — N952 Postmenopausal atrophic vaginitis: Secondary | ICD-10-CM

## 2017-06-11 DIAGNOSIS — M25541 Pain in joints of right hand: Secondary | ICD-10-CM | POA: Diagnosis not present

## 2017-06-11 DIAGNOSIS — M25542 Pain in joints of left hand: Secondary | ICD-10-CM

## 2017-06-11 MED ORDER — TRAMADOL HCL 50 MG PO TABS: 50.0000 mg | ORAL_TABLET | Freq: Three times a day (TID) | ORAL | 2 refills | Status: DC | PRN

## 2017-06-11 NOTE — Progress Notes (Signed)
Subjective:  Patient ID: Kim Santiago, female    DOB: [birth year]  Age: 82 y.o. MRN: [id number]  CC: The primary encounter diagnosis was Arthralgia, unspecified joint. Diagnoses of Encounter for immunization, Vaginal atrophy, Iatrogenic hypothyroidism, Discoid lupus, Sjogren's syndrome, with unspecified organ involvement ([place]), and Arthralgia of both hands were also pertinent to this visit.  HPI Kim Santiago presents for follow up on multiple issues  , last  seen in May   Recurrent evaluations for dysuria with UA and cultures   Negative for infection .  Vaginal atrophy discussed   Taking femara for breast ca left breast diagnsed  in diagnosed in March 2017 s/p mastectomy   Dermatologist  concerned about combination of methotrexate   furosemide and losartan.   Stopped furosemide 3 weeks ago    Oncology wants labs every 3 months   Gets charged$ 200 every time she gets labs done at Oncology   Has discoid lupus and RA. hands flaring right now , painful hoints.  Has not see Dr Jefm Bryant in quite some time.   Outpatient Medications Prior to Visit  Medication Sig Dispense Refill  . aspirin 81 MG tablet Take 81 mg by mouth daily.    Marland Kitchen Bioflavonoid Products (BIOFLEX PO) Take 1 capsule by mouth.    . Calcium Carbonate-Vitamin D (CALCIUM + D) 600-200 MG-UNIT TABS Take 2 tablets by mouth 2 (two) times daily.     . cholecalciferol (VITAMIN D) 1000 UNITS tablet Take 2,000 Units by mouth daily.     . furosemide (LASIX) 20 MG tablet Once daily as needed for fluid retention 90 tablet 1  . letrozole (FEMARA) 2.5 MG tablet Take 1 tablet (2.5 mg total) by mouth daily. 90 tablet 2  . levothyroxine (SYNTHROID, LEVOTHROID) 88 MCG tablet Take 1 tablet (88 mcg total) by mouth daily. 90 tablet 4  . losartan (COZAAR) 25 MG tablet Take 1 tablet (25 mg total) by mouth daily. 90 tablet 4  . methotrexate (RHEUMATREX) 2.5 MG tablet Take 2.5 mg by mouth once a week. Caution:Chemotherapy. Protect from light.    .  metoprolol succinate (TOPROL XL) 25 MG 24 hr tablet Take 1 tablet (25 mg total) by mouth daily. 90 tablet 4  . mirtazapine (REMERON) 7.5 MG tablet Take 1 tablet (7.5 mg total) by mouth at bedtime. 90 tablet 1  . omeprazole (PRILOSEC) 20 MG capsule Take 1 capsule (20 mg total) by mouth 2 (two) times daily before a meal. 180 capsule 3  . polyethylene glycol (MIRALAX / GLYCOLAX) packet Take 17 g by mouth daily.    . vitamin B-12 (CYANOCOBALAMIN) 1000 MCG tablet Take 1,000 mcg by mouth daily.    Marland Kitchen tiZANidine (ZANAFLEX) 2 MG tablet Take 1 tablet (2 mg total) by mouth every 6 (six) hours as needed (for jaw pain). (Patient not taking: Reported on 06/11/2017) 90 tablet 0  . acetaminophen (TYLENOL) 650 MG CR tablet Take 650 mg by mouth every 8 (eight) hours as needed for pain.    Marland Kitchen triamcinolone cream (KENALOG) 0.1 % Apply 1 application topically 2 (two) times daily. (Patient not taking: Reported on 06/11/2017) 30 g 0   No facility-administered medications prior to visit.     Review of Systems;  Patient denies headache, fevers, malaise, unintentional weight loss, skin rash, eye pain, sinus congestion and sinus pain, sore throat, dysphagia,  hemoptysis , cough, dyspnea, wheezing, chest pain, palpitations, orthopnea, edema, abdominal pain, nausea, melena, diarrhea, constipation, flank pain, dysuria, hematuria, urinary  Frequency, nocturia,  numbness, tingling, seizures,  Focal weakness, Loss of consciousness,  Tremor, insomnia, depression, anxiety, and suicidal ideation.      Objective:  BP 138/68 (BP Location: Left Arm, Patient Position: Sitting, Cuff Size: Normal)   Pulse 82   Temp 98.3 F (36.8 C) (Oral)   Resp 15   Ht _0  (1.626 m)   Wt 145 lb 3.2 oz (65.9 kg)   SpO2 99%   BMI 24.92 kg/m   BP Readings from Last 3 Encounters:  06/11/17 138/68  05/11/17 134/80  04/30/17 128/82    Wt Readings from Last 3 Encounters:  06/11/17 145 lb 3.2 oz (65.9 kg)  05/11/17 145 lb 6.4 oz (66 kg)    03/30/17 144 lb 6.4 oz (65.5 kg)    General appearance: alert, cooperative and appears stated age Ears: normal TM's and external ear canals both ears Throat: lips, mucosa, and tongue normal; teeth and gums normal Neck: no adenopathy, no carotid bruit, supple, symmetrical, trachea midline and thyroid not enlarged, symmetric, no tenderness/mass/nodules Back: symmetric, no curvature. ROM normal. No CVA tenderness. Lungs: clear to auscultation bilaterally Heart: regular rate and rhythm, S1, S2 normal, no murmur, click, rub or gallop Abdomen: soft, non-tender; bowel sounds normal; no masses,  no organomegaly Pulses: 2+ and symmetric Skin: Skin color, texture, turgor normal. No rashes or lesions Lymph nodes: Cervical, supraclavicular, and axillary nodes normal.  No results found for: HGBA1C  Lab Results  Component Value Date   CREATININE 0.85 02/03/2017   CREATININE 0.64 10/05/2016   CREATININE 0.70 08/25/2016    Lab Results  Component Value Date   WBC 3.7 (L) 02/03/2017   HGB 11.0 (L) 02/03/2017   HCT 31.6 (L) 02/03/2017   PLT 169.0 02/03/2017   GLUCOSE 100 (H) 02/03/2017   CHOL 170 06/08/2016   TRIG 86.0 06/08/2016   HDL 44.20 06/08/2016   LDLDIRECT 117.6 12/27/2012   LDLCALC 108 (H) 06/08/2016   ALT 4 02/03/2017   AST 13 02/03/2017   NA 138 02/03/2017   K 4.4 02/03/2017   CL 103 02/03/2017   CREATININE 0.85 02/03/2017   BUN 17 02/03/2017   CO2 32 02/03/2017   TSH 1.70 02/03/2017    No results found.  Assessment & Plan:   Problem List Items Addressed This Visit    Discoid lupus    Managed with methotrexate.  Most recent labs were done in May,  Every 3 months going forward   Lab Results  Component Value Date   WBC 3.7 (L) 02/03/2017   HGB 11.0 (L) 02/03/2017   HCT 31.6 (L) 02/03/2017   MCV 94.9 02/03/2017   PLT 169.0 02/03/2017   Lab Results  Component Value Date   ALT 4 02/03/2017   AST 13 02/03/2017   ALKPHOS 83 02/03/2017   BILITOT 0.5 02/03/2017    Lab Results  Component Value Date   CREATININE 0.85 02/03/2017         Hand joint pain    Checking ESR and CRP next week to determine if rheumatoid arthritis is flaring       Iatrogenic hypothyroidism    Secondary to total thyroidectomy  for thyroid cancer, which occurred over 5 years ago,   Thyroid function is WNL on current dose.  No current changes needed.   Lab Results  Component Value Date   TSH 1.70 02/03/2017           Sjogren's syndrome ([place])    With dry eye, dry mouth,  Managed with Restasis .  Did not tolerate Placquenil due to concurrent development of a diffuse rash which turned out be discoid lupus       Vaginal atrophy    Recommended use of non hormonal lubricant  To manage urethral pain secondary menopause       Other Visit Diagnoses    Arthralgia, unspecified joint    -  Primary   Relevant Orders   Sedimentation rate   Encounter for immunization       Relevant Orders   Flu vaccine HIGH DOSE PF (Completed)     A total of 25 minutes of face to face time was spent with patient more than half of which was spent in counselling about the above mentioned conditions  and coordination of care  I have discontinued Ms. Carias's triamcinolone cream and acetaminophen. I am also having her start on traMADol. Additionally, I am having her maintain her cholecalciferol, Calcium Carbonate-Vitamin D, vitamin B-12, aspirin, polyethylene glycol, tiZANidine, metoprolol succinate, levothyroxine, losartan, furosemide, omeprazole, methotrexate, mirtazapine, letrozole, Bioflavonoid Products (BIOFLEX PO), and diphenhydramine-acetaminophen.  Meds ordered this encounter  Medications  . diphenhydramine-acetaminophen (TYLENOL PM) 25-500 MG TABS tablet    Sig: Take 1 tablet by mouth at bedtime as needed.  . traMADol (ULTRAM) 50 MG tablet    Sig: Take 1 tablet (50 mg total) by mouth every 8 (eight) hours as needed.    Dispense:  60 tablet    Refill:  2    Medications  Discontinued During This Encounter  Medication Reason  . acetaminophen (TYLENOL) 650 MG CR tablet Patient has not taken in last 30 days  . triamcinolone cream (KENALOG) 0.1 % Patient has not taken in last 30 days    Follow-up: No Follow-up on file.   [name], MD

## 2017-06-11 NOTE — Patient Instructions (Addendum)
I will be happy to draw your quarterly  labs and forward to Dr Mike Gip and Kellie Moor    I have ordered a "sed rate" to see if your joint pain is from your rheumatoid arthritis flaring.  Have the cancer center draw this with the other labs   You can use the tramadol for severe pain.  It does not have any effect on your kidneys or liver

## 2017-06-13 DIAGNOSIS — M25549 Pain in joints of unspecified hand: Secondary | ICD-10-CM | POA: Insufficient documentation

## 2017-06-13 NOTE — Assessment & Plan Note (Signed)
With dry eye, dry mouth,  Managed with Restasis .  Did not tolerate Placquenil due to concurrent development of a diffuse rash which turned out be discoid lupus

## 2017-06-13 NOTE — Assessment & Plan Note (Signed)
Secondary to total thyroidectomy  for thyroid cancer, which occurred over 5 years ago,   Thyroid function is WNL on current dose.  No current changes needed.   Lab Results  Component Value Date   TSH 1.70 02/03/2017

## 2017-06-13 NOTE — Assessment & Plan Note (Signed)
Managed with methotrexate.  Most recent labs were done in May,  Every 3 months going forward   Lab Results  Component Value Date   WBC 3.7 (L) 02/03/2017   HGB 11.0 (L) 02/03/2017   HCT 31.6 (L) 02/03/2017   MCV 94.9 02/03/2017   PLT 169.0 02/03/2017   Lab Results  Component Value Date   ALT 4 02/03/2017   AST 13 02/03/2017   ALKPHOS 83 02/03/2017   BILITOT 0.5 02/03/2017   Lab Results  Component Value Date   CREATININE 0.85 02/03/2017

## 2017-06-13 NOTE — Assessment & Plan Note (Signed)
Checking ESR and CRP next week to determine if rheumatoid arthritis is flaring

## 2017-06-13 NOTE — Assessment & Plan Note (Signed)
Recommended use of non hormonal lubricant  To manage urethral pain secondary menopause

## 2017-06-21 DIAGNOSIS — M9903 Segmental and somatic dysfunction of lumbar region: Secondary | ICD-10-CM | POA: Diagnosis not present

## 2017-06-21 DIAGNOSIS — M9902 Segmental and somatic dysfunction of thoracic region: Secondary | ICD-10-CM | POA: Diagnosis not present

## 2017-06-21 DIAGNOSIS — M9908 Segmental and somatic dysfunction of rib cage: Secondary | ICD-10-CM | POA: Diagnosis not present

## 2017-06-21 DIAGNOSIS — M546 Pain in thoracic spine: Secondary | ICD-10-CM | POA: Diagnosis not present

## 2017-07-07 ENCOUNTER — Ambulatory Visit: Payer: PPO | Admitting: Hematology and Oncology

## 2017-07-07 ENCOUNTER — Other Ambulatory Visit: Payer: PPO

## 2017-07-13 ENCOUNTER — Other Ambulatory Visit: Payer: Self-pay | Admitting: Urgent Care

## 2017-07-13 DIAGNOSIS — C50912 Malignant neoplasm of unspecified site of left female breast: Secondary | ICD-10-CM

## 2017-07-13 NOTE — Progress Notes (Signed)
[facility] day:  07/14/2017   Chief Complaint: Kim Santiago is a 82 y.o. female with stage IIB left breast cancer who is seen for 4 month assessment on Femara.  HPI:  The patient was last seen in the medical oncology clinic on 03/17/2017.  At that time, she denied any breast concerns.  Exam revealed a stable breast exam and cutaneous lupus.  CA27.29 was 31.6.  She continued Femara.  We discussed interval chest CT results revealing mild stable mediastinal lymphadenopathy. Plan was for continued surveillance every 6 months.   Symptomatically, patient is doing well. She has no acute concerns today. She continues to have lupus rash to her upper extremities. Patient denies breast concerns. She has experienced no B symptoms or interval infections. Patient is taking her Femara as prescribed.    Past Medical History:  Diagnosis Date  . Arthritis   . Breast cancer ([place]) 2017   left mastectomy done 11/2015  . Breast cancer in female [facility]) 11/18/2015   Left: 3.9 cm tumor, T2, 1/2 sentinel nodes positive for macro metastatic disease, N1, 3 negative nodes in the axillary tail, ER+,PR+, Her 2 neu, low Mammoprint score  . Cancer [facility])    thyroid takes levothyroxine  . Chronic kidney disease    UTI  . Genetic screening March 2017.   Mammoprint of left breast cancer: Low risk for recurrence.   . Hypertension   . hypothyroidism    secondary to thyroidectomy for thyroid ca  . Hypothyroidism   . Lupus   . Menopause 40s   natural, hot flashes and mood lability now gone, off prempro 7 months  . Myocardial infarction ([place]) 2013  . Osteoporosis    Osteopenia  . Rosacea   . Sjoegren syndrome ([place])   . Stress-induced cardiomyopathy September of 2013   EF 35%. Peak troponin was 1.8.    Past Surgical History:  Procedure Laterality Date  . BACK SURGERY    . BREAST BIOPSY Left 10/30/15   positive, done in Dr. Dwyane Luo office  . CARDIAC CATHETERIZATION   05/2012   [facility]. No significant CAD. Ejection fraction of 35% due to stress-induced cardiomyopathy.  . CHOLECYSTECTOMY    . COLONOSCOPY    . DILATION AND CURETTAGE OF UTERUS    . KYPHOSIS SURGERY  Feb 2008   L1, Dr. Mauri Pole  . LUMBAR DISC SURGERY     L4-L5  . MASTECTOMY Left 11/18/2015   positive  . SHOULDER ARTHROSCOPY  2004   Left, Dr. Jefm Bryant  . SPINE SURGERY     L4-5 diskectomy  . THYROIDECTOMY     Thyroid Cancer  . TONSILLECTOMY    . TUBAL LIGATION      Family History  Problem Relation Age of Onset  . COPD Father   . Cancer Father        esophageal  . Kidney disease Mother   . Heart disease Mother   . Kidney disease Sister   . Cancer Brother 65       colon cancer (both brothers)  . Heart attack Brother 71  . Heart disease Brother   . Breast cancer Neg Hx     Social History:  reports that  has never smoked. she has never used smokeless tobacco. She reports that she does not drink alcohol or use drugs.  She is from [place], [place].  She was married for 52 years.  Her husband died 66 months ago.  She lives alone in [place].  She has an adopted daughter and 2 grandchildren.  Her brother died of a MI following a cholecystectomy in 12/2015.  The patient is alone today.  Allergies:  Allergies  Allergen Reactions  . Amoxicillin Rash  . Codeine Nausea And Vomiting  . Naprosyn [Naproxen] Swelling  . Orudis [Ketoprofen] Hives  . Plaquenil [Hydroxychloroquine] Hives  . Sulfathiazole Rash    Current Medications: Current Outpatient Medications  Medication Sig Dispense Refill  . aspirin 81 MG tablet Take 81 mg by mouth daily.    Marland Kitchen Bioflavonoid Products (BIOFLEX PO) Take 1 capsule by mouth.    . Calcium Carbonate-Vitamin D (CALCIUM + D) 600-200 MG-UNIT TABS Take 2 tablets by mouth 2 (two) times daily.     . cholecalciferol (VITAMIN D) 1000 UNITS tablet Take 2,000 Units by mouth daily.     . diphenhydramine-acetaminophen (TYLENOL PM) 25-500 MG TABS tablet Take 1 tablet by  mouth at bedtime as needed.    . furosemide (LASIX) 20 MG tablet Once daily as needed for fluid retention 90 tablet 1  . letrozole (FEMARA) 2.5 MG tablet Take 1 tablet (2.5 mg total) by mouth daily. 90 tablet 2  . levothyroxine (SYNTHROID, LEVOTHROID) 88 MCG tablet Take 1 tablet (88 mcg total) by mouth daily. 90 tablet 4  . losartan (COZAAR) 25 MG tablet Take 1 tablet (25 mg total) by mouth daily. 90 tablet 4  . methotrexate (RHEUMATREX) 2.5 MG tablet Take 2.5 mg by mouth once a week. Caution:Chemotherapy. Protect from light.    . metoprolol succinate (TOPROL XL) 25 MG 24 hr tablet Take 1 tablet (25 mg total) by mouth daily. 90 tablet 4  . mirtazapine (REMERON) 7.5 MG tablet Take 1 tablet (7.5 mg total) by mouth at bedtime. 90 tablet 1  . omeprazole (PRILOSEC) 20 MG capsule Take 1 capsule (20 mg total) by mouth 2 (two) times daily before a meal. 180 capsule 3  . polyethylene glycol (MIRALAX / GLYCOLAX) packet Take 17 g by mouth daily.    Marland Kitchen tiZANidine (ZANAFLEX) 2 MG tablet Take 1 tablet (2 mg total) by mouth every 6 (six) hours as needed (for jaw pain). 90 tablet 0  . traMADol (ULTRAM) 50 MG tablet Take 1 tablet (50 mg total) by mouth every 8 (eight) hours as needed. 60 tablet 2  . vitamin B-12 (CYANOCOBALAMIN) 1000 MCG tablet Take 1,000 mcg by mouth daily.     No current facility-administered medications for this visit.     Review of Systems:  GENERAL:  Feels "well".  No fevers or sweats.  Weight down 2 pounds. PERFORMANCE STATUS (ECOG):  1 HEENT:  No visual changes, runny nose, sore throat, mouth sores or tenderness. Lungs: No shortness of breath or cough.  No hemoptysis. Cardiac:  No chest pain, palpitations, orthopnea, or PND. GI:  No nausea, vomiting, diarrhea, constipation, melena or hematochezia. GU:  No urgency, frequency, dysuria, or hematuria. Musculoskeletal:  No back pain.  Little joint pain.  No muscle tenderness. Extremities:  No pain or swelling. Skin:  Hair thinning.   Cutaneous lupus. Neuro:  No headache, numbness or weakness, balance or coordination issues. Endocrine:  No diabetes.  Thyroid disease on Synthroid.  Few hot flashes.  No night sweats. Psych:   No mood changes, anxiety or depression.  Pain:  No focal pain. Review of systems:  All other systems reviewed and found to be negative.  Physical Exam: Blood pressure 137/88, pulse 75, temperature (!) 96.7 F (35.9 C), temperature source Tympanic, resp. rate 18, weight  143 lb 1.3 oz (64.9 kg). GENERAL:  Well developed, well nourished, woman sitting comfortably in the exam room in no acute distress. MENTAL STATUS:  Alert and oriented to person, place and time. HEAD:  Short gray hair.  Normocephalic, atraumatic, face symmetric, no Cushingoid features. EYES:  Blue rimmed glasses.  Blue eyes.  Pupils equal round and reactive to light and accomodation. No conjunctivitis or scleral icterus. ENT: Oropharynx clear without lesion. Tongue normal. Mucous membranes moist.  RESPIRATORY: Clear to auscultation without rales, wheezes or rhonchi. CARDIOVASCULAR: Regular rate and rhythm without murmur, rub or gallop. No JVD. BREASTS:  Left mastectomy without erythema or nodularity.  Tender right sided fibrocystic changes.  Right breast without masses, skin changes or nipple discharge. ABDOMEN: Soft, non-tender, with active bowel sounds, and no hepatosplenomegaly. SKIN: Rash upper extremities with some excoriations secondary to lupus.  EXTREMITIES: No edema, no skin discoloration or tenderness. No palpable cords. LYMPH NODES:  No palpable cervical, supraclavicular, axillary or inguinal adenopathy  NEUROLOGICAL: Unremarkable. PSYCH: Appropriate.  Labs: Appointment on 07/14/2017  Component Date Value Ref Range Status  . Sodium 07/14/2017 136  135 - 145 mmol/L Final  . Potassium 07/14/2017 4.4  3.5 - 5.1 mmol/L Final  . Chloride 07/14/2017 102  101 - 111 mmol/L Final  . CO2 07/14/2017 26  22 - 32 mmol/L  Final  . Glucose, Bld 07/14/2017 98  65 - 99 mg/dL Final  . BUN 07/14/2017 15  6 - 20 mg/dL Final  . Creatinine, Ser 07/14/2017 0.68  0.44 - 1.00 mg/dL Final  . Calcium 07/14/2017 9.1  8.9 - 10.3 mg/dL Final  . Total Protein 07/14/2017 7.5  6.5 - 8.1 g/dL Final  . Albumin 07/14/2017 4.2  3.5 - 5.0 g/dL Final  . AST 07/14/2017 26  15 - 41 U/L Final  . ALT 07/14/2017 11* 14 - 54 U/L Final  . Alkaline Phosphatase 07/14/2017 102  38 - 126 U/L Final  . Total Bilirubin 07/14/2017 0.4  0.3 - 1.2 mg/dL Final  . GFR calc non Af Amer 07/14/2017 >60  >60 mL/min Final  . GFR calc Af Amer 07/14/2017 >60  >60 mL/min Final   Comment: (NOTE) The eGFR has been calculated using the CKD EPI equation. This calculation has not been validated in all clinical situations. eGFR's persistently <60 mL/min signify possible Chronic Kidney Disease.   . Anion gap 07/14/2017 8  5 - 15 Final  . WBC 07/14/2017 3.6  3.6 - 11.0 K/uL Final  . RBC 07/14/2017 3.42* 3.80 - 5.20 MIL/uL Final  . Hemoglobin 07/14/2017 12.0  12.0 - 16.0 g/dL Final  . HCT 07/14/2017 34.2* 35.0 - 47.0 % Final  . MCV 07/14/2017 100.0  80.0 - 100.0 fL Final  . MCH 07/14/2017 35.1* 26.0 - 34.0 pg Final  . MCHC 07/14/2017 35.1  32.0 - 36.0 g/dL Final  . RDW 07/14/2017 16.5* 11.5 - 14.5 % Final  . Platelets 07/14/2017 170  150 - 440 K/uL Final  . Neutrophils Relative % 07/14/2017 54  % Final  . Neutro Abs 07/14/2017 2.0  1.4 - 6.5 K/uL Final  . Lymphocytes Relative 07/14/2017 28  % Final  . Lymphs Abs 07/14/2017 1.0  1.0 - 3.6 K/uL Final  . Monocytes Relative 07/14/2017 11  % Final  . Monocytes Absolute 07/14/2017 0.4  0.2 - 0.9 K/uL Final  . Eosinophils Relative 07/14/2017 5  % Final  . Eosinophils Absolute 07/14/2017 0.2  0 - 0.7 K/uL Final  . Basophils Relative 07/14/2017  2  % Final  . Basophils Absolute 07/14/2017 0.1  0 - 0.1 K/uL Final  . CA 27.29 07/14/2017 45.7* 0.0 - 38.6 U/mL Final   Comment: (NOTE) [facility]/ACS  methodology Performed At: [facility] [place], [place] [id number] Rush Farmer MD EP:[id number]   . Sed Rate 07/14/2017 35* 0 - 30 mm/hr Final    No visits with results within 3 Day(s) from this visit. Latest known visit with results is:  Admission on 11/18/2015, Discharged on 11/18/2015  Component Date Value Ref Range Status  . SURGICAL PATHOLOGY 11/18/2015    Final-Edited                   Value:Surgical Pathology THIS IS AN ADDENDUM REPORT CASE: [id number] PATIENT: [facility] Surgical Pathology Report Addendum  Reason for Addendum #1:  Molecular studies  SPECIMEN SUBMITTED: A. Breast, left B. Sentinel lymph node #1  #2  CLINICAL HISTORY: None provided  PRE-OPERATIVE DIAGNOSIS: Left breast cancer  POST-OPERATIVE DIAGNOSIS: Same as pre-op     DIAGNOSIS: A. LEFT BREAST; TOTAL MASTECTOMY: - SOLITARY INVASIVE MAMMARY CARCINOMA OF NO SPECIAL TYPE, 3.9 CM. - DUCTAL CARCINOMA IN SITU. - LYMPH VASCULAR AND DERMAL LYMPHATIC INVASION IDENTIFIED. - THE SURGICAL MARGINS ARE NEGATIVE. - MICROCALCIFICATIONS IN BENIGN AND MALIGNANT TISSUE. - THREE BENIGN AXILLARY LYMPH NODES (0/3). - SEE SUMMARY BELOW.  B. SENTINEL LYMPH NODE #1 AND 2; EXCISION: - ONE OF TWO LYMPH NODES POSITIVE FOR MACRO METASTASES (1/2).   INVASIVE CARCINOMA OF THE BREAST: Complete Excision Specimens InvolvedA: Breast, left B: Sentinel lymph nod                         e #1  #2  SPECIMEN Procedure:     Total mastectomy (including nipple and skin) Lymph Node Sampling:     Sentinel lymph node(s) Specimen Laterality:     Left TUMOR Presence of Invasive Carcinoma:    Histologic Type: Invasive mammary carcinoma of no special type (ductal, not otherwise specified) Histologic Grade:   Glandular (acinar) / tubular differentiation: Score 3 (< 10% of tumor area forming glandular / tubular structures) Nuclear pleomorphism: Score 2 (cells larger than normal with open  vesicular nuclei, visible nucleoli, and moderate variability in both size and shape) Mitotic rate:  Score 1 (<=3 mitoses per mm2) Overall grade: Grade 2 (scores of 6 or 7) Ductal Carcinoma In Situ (DCIS):   DCIS is present Negative for extensive intraductal component (EIC) Tumor Size / Focality:   Tumor size: size of largest invasive carcinoma: Greatest dimension of largest focus of invasion > 1 mm: 56m Tumor focality:     Single focus of invasive carcinoma Tumor Extent Macr                         oscopic and Microscopic Extent of Tumor Skin:     Skin invasion: Invasive carcinoma directly invades into the dermis or epidermis without skin ulceration (this does not change the T stage) Skin satellite foci:     Negative for satellite foci Nipple DCIS:   DCIS does not involve the nipple epidermis Skeletal muscle:    Specimen does not contain skeletal muscle Accessory Tumor Findings Lymph-Vascular Invasion: Present Treatment Effect: Response to Presurgical (Neoadjuvant) Therapy: No known presurgical therapy MARGINS Invasive Carcinoma: Margins negative for invasive carcinoma Distance from closest margin: Distance is > 10 mm Closest Negative Margin(s):   Posterior Ductal Carcinoma In Situ (DCIS):  Margins negative for DCIS (DCIS present in specimen) Distance of DCIS from closest margin:   Distance is > 10 mm Closest Negative Margin(s):   Posterior LYMPH NODES Regional Lymph Nodes:    Number of Sentinel Nodes Examined: Specify number 2 Number of lymph node(s) exam                         ined:  Number 5 Lymph node involvement:  Number of lymph nodes with macrometastases (> 2 mm): Number 1 Number of lymph nodes with micrometastases: Number 0 Number of lymph nodes with isolated tumor cells (<= 0.2 mm and <= 200 cells):   Number 0 STAGE (pTNM) TNM Descriptors:    Not applicable n/a Primary Tumor (Invasive Carcinoma) (pT):     pT2:  tumor > 20 mm but <= 50 mm in greatest  dimension Regional lymph nodes (pN) (choose a category based on lymph nodes received with the specimen;  immunohistochemistry and / or molecular studies are not required) Category (pN): pN1a: metastases in 1 to 3 axillary lymph nodes, at least 1 metastasis greater than 2 mm Distant Metastasis (pM): Not applicable - pM cannot be determined from the submitted specimen(s) Comment(s): Invasive carcinoma is identified in tissue between the two masses. The tumor is entirely submitted and is present in 13 slides.   GROSS DESCRIPTION: A. Labeled: Left breast  Time in fixative: 3:10                          PM on 11/18/2015  Cold ischemic time: 40 minutes  Total fixation time: 6 1/2 Hours  Type of mastectomy: Simple  Laterality: Left  Weight of specimen: 707 grams  Size of specimen: 23.5 x 20.5 x 3.9 cm  Orientation of specimen: The letters M and L are written in surgical marker on the skin surface to designate the medial and lateral aspects specimen. Anterior half specimen inked blue, posterior specimen inked black.  Skin ellipse dimensions  description: 15.5 x 11.2 cm, unremarkable  Nipple/ areola: Nipple 1 cm in diameter, flattened. Areola 4.3 cm in diameter, unremarkable  Axillary tail: Absent  Biopsy site(s): 2 discrete biopsy sites identified  Number of discrete masses: 2  Location of mass(es): One mass is located approximately at the 6:00 position behind the nipple areolar complex, and the other is located in the approximate 1:00 position in the upper outer quadrant.  Distance between masses: Approximately 1.8 cm  Size of mass(es)/biopsy site(s):                          First mass approximately 1 x 0.9 x 0.5 cm, and second mass approximately 1 x 0.7 x 0.8 cm  Description of mass(es)/biopsy site(s): The first mass is surrounded by a large amount of fibrous tissue measures approximately 2.5 x 2.5 x 1.5 cm. Both the first and second masses are extremely poorly  defined, stellate, pale tan to white.  Margins: The first mass is grossly located 3.5 cm from the deep margin and the second mass is grossly located 3 cm from the deep margin  Gross involvement of skin/fascia/muscle by tumor: Absent  Description of remaining breast: The rest the breast tissue is predominantly fatty with interspersed areas of dense white fibrous tissue  Lymph nodes: On dissection 3 lymph node candidates identified from 0.5 cm to 1.2 x 0.8 x 0.5 cm.  Block Summary: 1-skin 2-nipple 3-deep  margin of resection 4-9 entire first mass including biopsy cavity and adjacent fibrous tissue at the left breast mass 10-12-entire second mass 13-16-breast tissue between                          first and second mass 17-representative section upper inner quadrant 18-representative section lower inner quadrant 19-representative section upper outer quadrant 20-representative section lower outer quadrant 21- 2 lymph node candidates submitted on 11/20/15 22-1 sectioned lymph node candidate submitted on 11/20/15  B. Labeled: Sentinel node #1 and 2  Tissue fragment(s): 2  Size: 0.4 x 0.3 x 0.2 cm and 1.2 x 0.6 x 0.6 cm  Description: 2 pink red lymph node candidates covered by adipose tissue, smaller inked blue  Entirely submitted in one cassette(s).         Final Diagnosis performed by Delorse Lek, MD.  Electronically signed 11/21/2015 2:59:43PM   The electronic signature indicates that the named Attending Pathologist has evaluated the specimen  Technical component performed at [facility], [address], [place], [place] [address] Lab: [phone] Dir: Darrick Penna. Evette Doffing, MD  Professional component performed at [facility], [facility],                          [place], [place], [place] [address] Lab: [phone] Dir: Dellia Nims. Reuel Derby, MD   ADDENDUM: At Dr. Kem Parkinson request, FFPE tumor tissue (block A8) was submitted to [place], [place], for MammaPrint testing, a 70-gene expression profile. The assay result is reported as Low Risk, +0.192. Please see separate report, requisition number [id number], in [place].    Addendum #1 performed by Bryan Lemma, MD.  Electronically signed 12/24/2015 12:25:01PM    Technical component performed at [facility], [address] [place], [place] [address] Lab: [phone] Dir: Darrick Penna. Evette Doffing, MD  Professional component performed at [facility], [facility], [place], [place], [place] [address] Lab: [phone] Dir: Dellia Nims. Rubinas, MD      Assessment:  JILENE SPOHR is a 82 y.o. female with stage IIB left breast cancer s/p mastectomy on 11/18/2015.  Pathology revealed a 3.9 cm grade II invasive mammary carcinoma.  There was ductal carcinoma in situ (DCIS).  There was lymphvascular and dermal lymphatic invasion.  Margins were negative.  One of 2 sentinel lymph nodes were positive for macrometastasis.  In addition, there were 3 benign axillary lymph nodes (total 5 lymph nodes).  Tumor was estrogen receptor positive (100%), progesterone receptor positive (70%), and Her2/neu negative.  Pathologic stage was T2N1a.   Bilateral mammogram and ultrasound on 11/11/2015 revealed a 3 cm left breast mass at the 6 o'clock position (1 cm from the nipple) and a 1.6 cm irregular hypoechoic left breast mass at 1 o'clock position (5 cm from the nipple) with overlying skin thickening.  Right mammogram on 11/18/2016 revealed no evidence of malignancy.  MammaPrint on 12/18/2015 revealed a low risk of recurrence with a 5% risk of distant recurrence of 5 years and a 10% recurrence at 10 years.  She declined radiation.  She began Femara on 01/09/2016.  She has a little joint pain and a few hot flashes.  PET scan on 12/23/2016 revealed slightly enlarged right lower paratracheal lymph node and some clustered small AP window lymph nodes with metabolic activity very minimally above  background in the mediastinum.  There were no other findings of hypermetabolic lesions in the neck, chest, abdomen, or pelvis.  Chest CT on 03/16/2017 revealed stable mild mediastinal lymphadenopathy.  Largest node was 1.0 cm in the right paratracheal region.  There was no new or progressive disease within thorax.  CA27.29 has been followed: 30.4 on 11/15/2015, 32.5 on 05/13/2016, 19.0 on 08/12/2016, 40.8 on 11/11/2016, 45.8 on 12/14/2016, and 31.6 on 03/17/2017.  Bone density study on 12/05/2015 revealed osteopenia with a T-score of -1.5 in the left femoral neck.  She is on calcium and vitamin D.  She has a history of a myocardial infarction in 06/2012.  Echocardiogram revealed an EF of 35%.  Follow-up echo on 09/21/2015 revealed an EF of 55-60%.  Patient is unaware of a history of heart failure.   She has a history of thyroid carcinoma 40 years ago treated with I-131.   She has a mild normocytic anemia.  Diet is good.  Colonoscopy on 07/25/2013 revealed 3 small polyps (91m proximal descending colon, 3 mm splenic flexure, 1 mm ascending colon).  She denies any melena or hematochezia.  TSH was normal on 12/26/2015.  She has cutaneous lupus and Sjogren's.  She had a reaction to Plaquenil.  She completed an extended steroid taper.   Symptomatically, she denies any breast concerns.  Exam reveals a stable breast exam and cutaneous lupus.   Plan: 1.  Labs today:  CBC with diff, CMP, CA27.29, ESR 2.  Schedule follow up chest CT without contrast on 09/15/2017 3.  Anticipate bone density 12/03/2017.  Discuss consideration of Prolia if declining bone density associated with age and aromatase inhibitor therapy. We discussed the need for dental clearance. Patient seeing her dentist in January. Written information provided to patient on Prolia for her to review at home. In the interim, patient encouraged to continue calcium 1200 mg and vitamin D 800 IU daily.  4.  Continue Femara.  5.  RTC in 4 months (after  bone density) for MD assessment and labs (CBC with diff, CMP, CA27.29), review of chest CT and bone density.    MLequita Asal MD  07/14/2017 , 11:20 AM   I saw and evaluated the patient, participating in the key portions of the service and reviewing pertinent diagnostic studies and records.  I reviewed the nurse practitioner's note and agree with the findings and the plan.  The assessment and plan were discussed with the patient. A few questions were asked by the patient and answered.   MNolon Stalls MD 07/14/2017,11:20 AM

## 2017-07-14 ENCOUNTER — Inpatient Hospital Stay: Payer: PPO | Attending: Hematology and Oncology | Admitting: Hematology and Oncology

## 2017-07-14 ENCOUNTER — Inpatient Hospital Stay: Payer: PPO

## 2017-07-14 ENCOUNTER — Other Ambulatory Visit: Payer: Self-pay | Admitting: *Deleted

## 2017-07-14 VITALS — BP 137/88 | HR 75 | Temp 96.7°F | Resp 18 | Wt 143.1 lb

## 2017-07-14 DIAGNOSIS — R590 Localized enlarged lymph nodes: Secondary | ICD-10-CM | POA: Insufficient documentation

## 2017-07-14 DIAGNOSIS — C50912 Malignant neoplasm of unspecified site of left female breast: Secondary | ICD-10-CM | POA: Diagnosis not present

## 2017-07-14 DIAGNOSIS — I252 Old myocardial infarction: Secondary | ICD-10-CM | POA: Diagnosis not present

## 2017-07-14 DIAGNOSIS — N189 Chronic kidney disease, unspecified: Secondary | ICD-10-CM | POA: Diagnosis not present

## 2017-07-14 DIAGNOSIS — Z79899 Other long term (current) drug therapy: Secondary | ICD-10-CM | POA: Diagnosis not present

## 2017-07-14 DIAGNOSIS — Z9012 Acquired absence of left breast and nipple: Secondary | ICD-10-CM | POA: Insufficient documentation

## 2017-07-14 DIAGNOSIS — Z800 Family history of malignant neoplasm of digestive organs: Secondary | ICD-10-CM | POA: Insufficient documentation

## 2017-07-14 DIAGNOSIS — Z86010 Personal history of colonic polyps: Secondary | ICD-10-CM | POA: Insufficient documentation

## 2017-07-14 DIAGNOSIS — L931 Subacute cutaneous lupus erythematosus: Secondary | ICD-10-CM | POA: Insufficient documentation

## 2017-07-14 DIAGNOSIS — Z79811 Long term (current) use of aromatase inhibitors: Secondary | ICD-10-CM | POA: Diagnosis not present

## 2017-07-14 DIAGNOSIS — M85852 Other specified disorders of bone density and structure, left thigh: Secondary | ICD-10-CM | POA: Diagnosis not present

## 2017-07-14 DIAGNOSIS — M2550 Pain in unspecified joint: Secondary | ICD-10-CM

## 2017-07-14 DIAGNOSIS — Z7982 Long term (current) use of aspirin: Secondary | ICD-10-CM | POA: Insufficient documentation

## 2017-07-14 DIAGNOSIS — D649 Anemia, unspecified: Secondary | ICD-10-CM | POA: Insufficient documentation

## 2017-07-14 DIAGNOSIS — M3500 Sicca syndrome, unspecified: Secondary | ICD-10-CM | POA: Diagnosis not present

## 2017-07-14 DIAGNOSIS — C773 Secondary and unspecified malignant neoplasm of axilla and upper limb lymph nodes: Secondary | ICD-10-CM | POA: Insufficient documentation

## 2017-07-14 DIAGNOSIS — Z880 Allergy status to penicillin: Secondary | ICD-10-CM | POA: Insufficient documentation

## 2017-07-14 DIAGNOSIS — Z170 Estrogen receptor positive status [ER+]: Secondary | ICD-10-CM | POA: Diagnosis not present

## 2017-07-14 DIAGNOSIS — R232 Flushing: Secondary | ICD-10-CM | POA: Insufficient documentation

## 2017-07-14 DIAGNOSIS — R599 Enlarged lymph nodes, unspecified: Secondary | ICD-10-CM

## 2017-07-14 DIAGNOSIS — E039 Hypothyroidism, unspecified: Secondary | ICD-10-CM | POA: Diagnosis not present

## 2017-07-14 DIAGNOSIS — I129 Hypertensive chronic kidney disease with stage 1 through stage 4 chronic kidney disease, or unspecified chronic kidney disease: Secondary | ICD-10-CM | POA: Diagnosis not present

## 2017-07-14 DIAGNOSIS — Z853 Personal history of malignant neoplasm of breast: Secondary | ICD-10-CM

## 2017-07-14 LAB — CBC WITH DIFFERENTIAL/PLATELET
Basophils Absolute: 0.1 10*3/uL (ref 0–0.1)
Basophils Relative: 2 %
Eosinophils Absolute: 0.2 10*3/uL (ref 0–0.7)
Eosinophils Relative: 5 %
HCT: 34.2 % — ABNORMAL LOW (ref 35.0–47.0)
Hemoglobin: 12 g/dL (ref 12.0–16.0)
Lymphocytes Relative: 28 %
Lymphs Abs: 1 10*3/uL (ref 1.0–3.6)
MCH: 35.1 pg — ABNORMAL HIGH (ref 26.0–34.0)
MCHC: 35.1 g/dL (ref 32.0–36.0)
MCV: 100 fL (ref 80.0–100.0)
Monocytes Absolute: 0.4 10*3/uL (ref 0.2–0.9)
Monocytes Relative: 11 %
Neutro Abs: 2 10*3/uL (ref 1.4–6.5)
Neutrophils Relative %: 54 %
Platelets: 170 10*3/uL (ref 150–440)
RBC: 3.42 MIL/uL — ABNORMAL LOW (ref 3.80–5.20)
RDW: 16.5 % — ABNORMAL HIGH (ref 11.5–14.5)
WBC: 3.6 10*3/uL (ref 3.6–11.0)

## 2017-07-14 LAB — COMPREHENSIVE METABOLIC PANEL
ALT: 11 U/L — ABNORMAL LOW (ref 14–54)
AST: 26 U/L (ref 15–41)
Albumin: 4.2 g/dL (ref 3.5–5.0)
Alkaline Phosphatase: 102 U/L (ref 38–126)
Anion gap: 8 (ref 5–15)
BUN: 15 mg/dL (ref 6–20)
CO2: 26 mmol/L (ref 22–32)
Calcium: 9.1 mg/dL (ref 8.9–10.3)
Chloride: 102 mmol/L (ref 101–111)
Creatinine, Ser: 0.68 mg/dL (ref 0.44–1.00)
GFR calc Af Amer: >60 mL/min (ref >60)
GFR calc non Af Amer: >60 mL/min (ref >60)
Glucose, Bld: 98 mg/dL (ref 65–99)
Potassium: 4.4 mmol/L (ref 3.5–5.1)
Sodium: 136 mmol/L (ref 135–145)
Total Bilirubin: 0.4 mg/dL (ref 0.3–1.2)
Total Protein: 7.5 g/dL (ref 6.5–8.1)

## 2017-07-14 LAB — SEDIMENTATION RATE: Sed Rate: 35 mm/hr — ABNORMAL HIGH (ref 0–30)

## 2017-07-14 NOTE — Patient Instructions (Signed)

## 2017-07-14 NOTE — Progress Notes (Signed)
Patient here today for follow up regarding history of breast cancer.  She presents today with lab orders from Dr. Derrel Nip and her dermatologist.  Asking if they can all be drawn here today.  She is also requesting information regarding her billing for the clinic.  I informed her that she will have to speak to MD regarding the labs.  I will call her tomorrow with the contact information regarding her billing. States she continues to have joint pain from her rheumatoid arthritis and lupus.

## 2017-07-15 ENCOUNTER — Telehealth: Payer: Self-pay | Admitting: *Deleted

## 2017-07-15 LAB — CANCER ANTIGEN 27.29: CA 27.29: 45.7 U/mL — ABNORMAL HIGH (ref 0.0–38.6)

## 2017-07-15 NOTE — Telephone Encounter (Signed)
Called patient and gave her customer service numbers regarding her billing questions of yesterday.  Patient very appreciative of call.

## 2017-07-15 NOTE — Telephone Encounter (Signed)
Called patient and LVM to inform her that her CA27.29 is elevated.  MD feels like it is due to her lupus.  Will recheck lab in one month.

## 2017-07-15 NOTE — Telephone Encounter (Signed)
-----   Message from Lequita Asal, MD sent at 07/15/2017  4:27 AM EST ----- Regarding: Please call patient  CA27.29 elevated.  May be due to lupus.  Recheck in 1 month.  M  ----- Message ----- From: Interface, Lab In [place] Sent: 07/14/2017  11:45 AM To: Lequita Asal, MD

## 2017-07-18 ENCOUNTER — Encounter: Payer: Self-pay | Admitting: Hematology and Oncology

## 2017-08-25 DIAGNOSIS — L931 Subacute cutaneous lupus erythematosus: Secondary | ICD-10-CM | POA: Diagnosis not present

## 2017-08-25 DIAGNOSIS — L932 Other local lupus erythematosus: Secondary | ICD-10-CM | POA: Diagnosis not present

## 2017-08-25 DIAGNOSIS — L309 Dermatitis, unspecified: Secondary | ICD-10-CM | POA: Diagnosis not present

## 2017-09-15 ENCOUNTER — Ambulatory Visit: Payer: PPO

## 2017-09-19 IMAGING — CR DG CHEST 2V
1 series · 2 of 2 positions shown · non-contrast
Comparison: 12/10/2004

CLINICAL DATA: Chest pain and shortness of breath for 2 days. Left
breast carcinoma. Sjogren syndrome.

EXAM:
CHEST  2 VIEW

[Series 1: dg chest 2 view · 0.14mm/px · 2 of 2 slices shown]
[im 1/2]
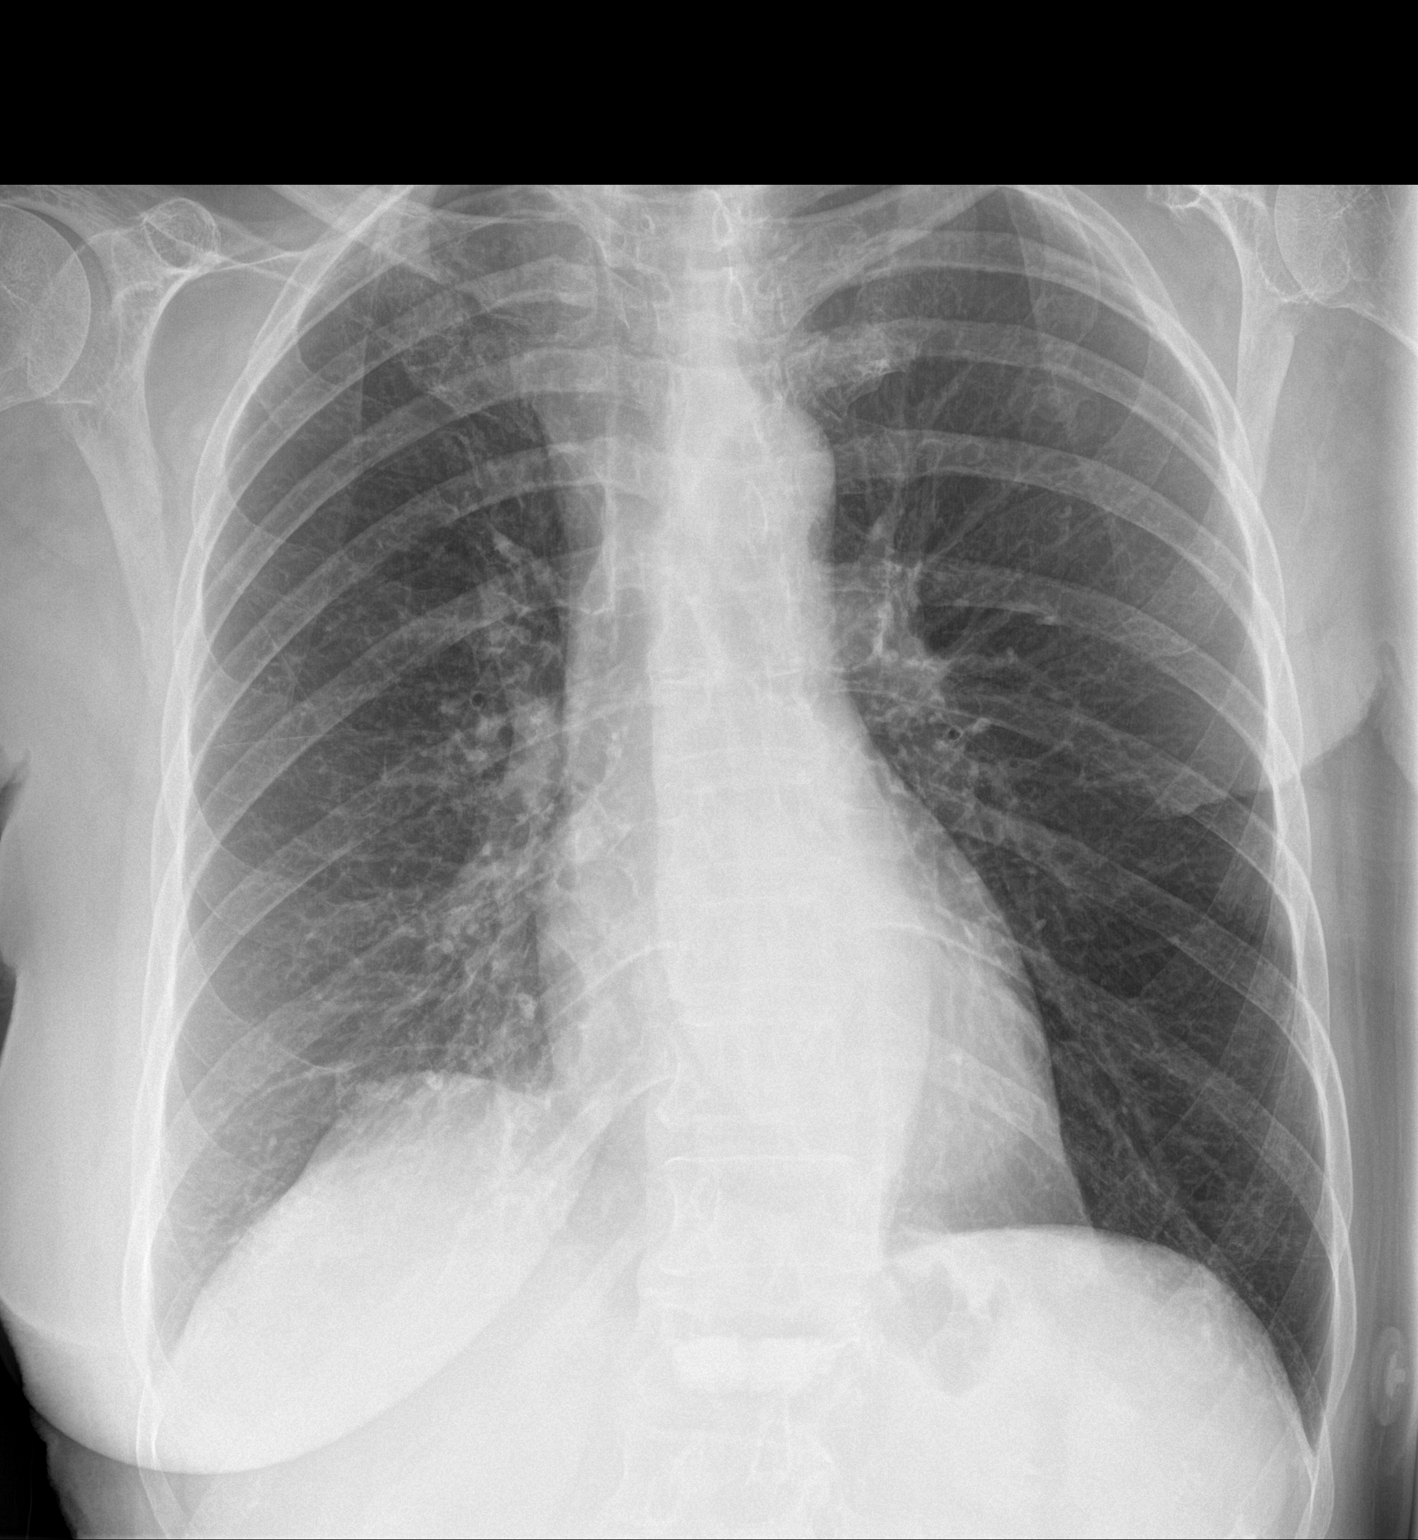
[im 2/2]
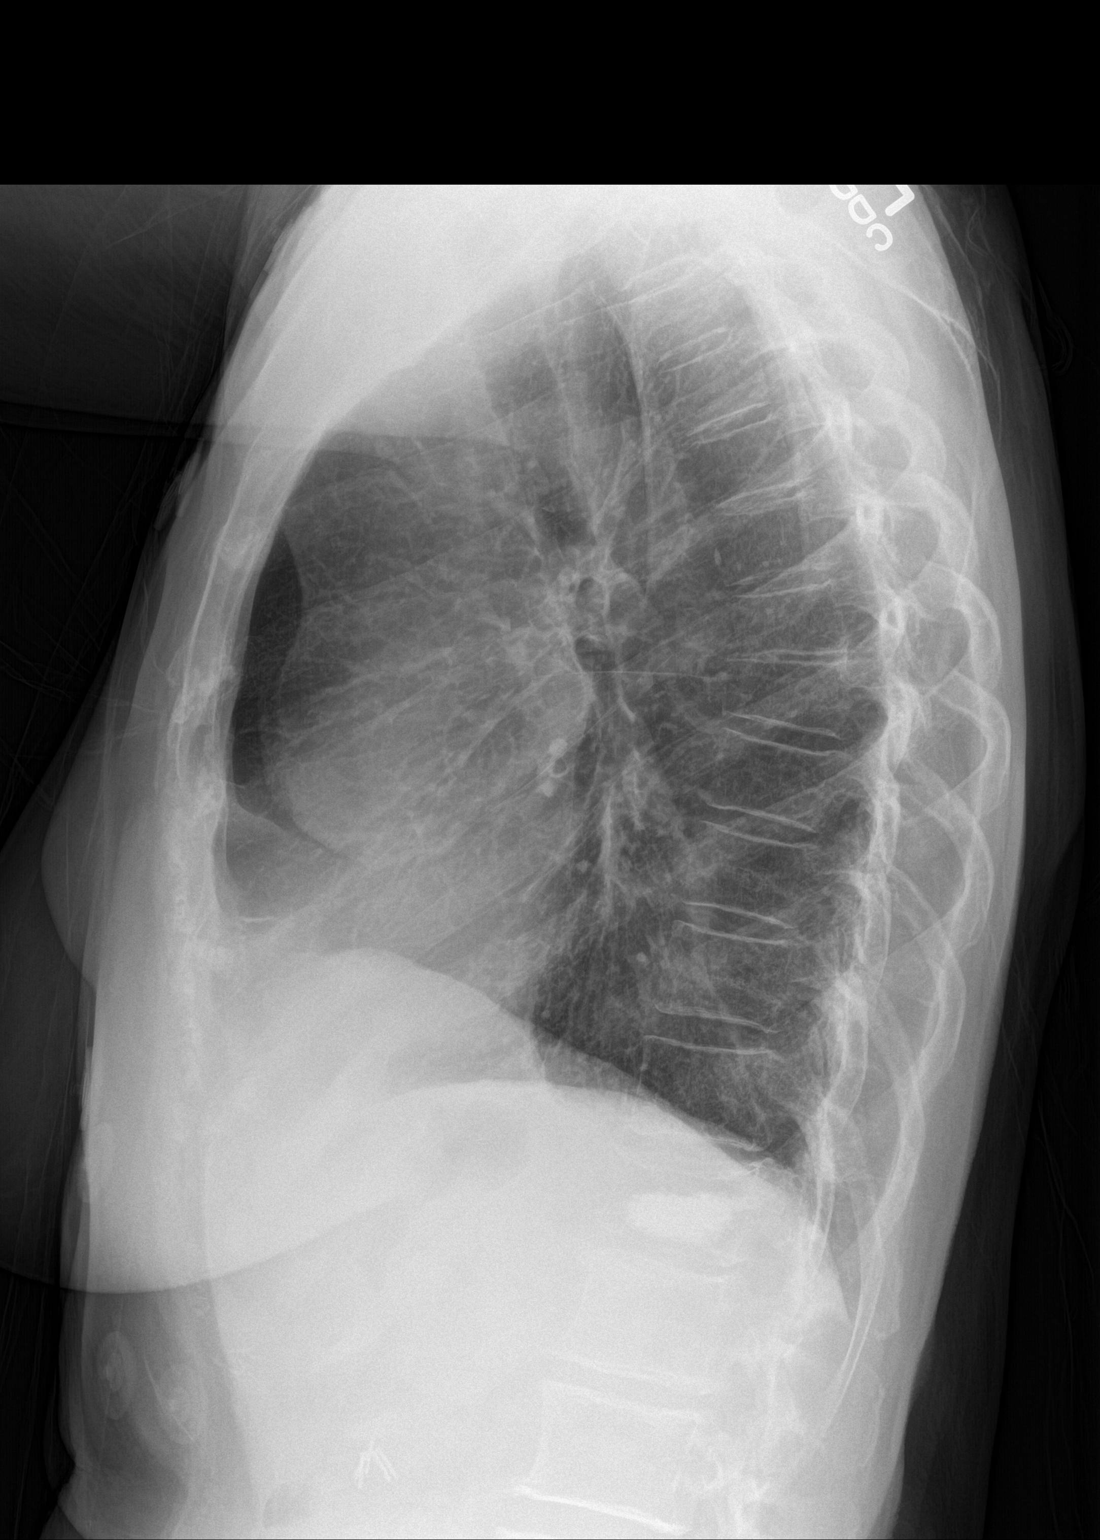

[2 of 2 positions shown; findings below may reference images not displayed]

FINDINGS: The heart size and mediastinal contours are within normal limits.
Both lungs are clear. No evidence of pleural effusion or
pneumothorax. Previous left mastectomy and L1 vertebroplasty noted.
IMPRESSION: No active cardiopulmonary disease.

## 2017-09-23 ENCOUNTER — Ambulatory Visit
Admission: RE | Admit: 2017-09-23 | Discharge: 2017-09-23 | Disposition: A | Discharge status: Home / Self Care | Payer: PPO | Source: Ambulatory Visit | Attending: Urgent Care | Admitting: Urgent Care

## 2017-09-23 ENCOUNTER — Ambulatory Visit: Payer: PPO

## 2017-09-23 DIAGNOSIS — C50912 Malignant neoplasm of unspecified site of left female breast: Secondary | ICD-10-CM

## 2017-09-23 DIAGNOSIS — R590 Localized enlarged lymph nodes: Secondary | ICD-10-CM | POA: Insufficient documentation

## 2017-09-23 DIAGNOSIS — I700 Atherosclerosis of aorta: Secondary | ICD-10-CM | POA: Insufficient documentation

## 2017-10-06 ENCOUNTER — Other Ambulatory Visit: Payer: Self-pay

## 2017-10-06 DIAGNOSIS — Z1231 Encounter for screening mammogram for malignant neoplasm of breast: Secondary | ICD-10-CM

## 2017-10-11 ENCOUNTER — Encounter: Payer: Self-pay | Admitting: *Deleted

## 2017-10-13 DIAGNOSIS — L931 Subacute cutaneous lupus erythematosus: Secondary | ICD-10-CM | POA: Diagnosis not present

## 2017-10-14 DIAGNOSIS — Z5181 Encounter for therapeutic drug level monitoring: Secondary | ICD-10-CM | POA: Diagnosis not present

## 2017-10-14 DIAGNOSIS — L931 Subacute cutaneous lupus erythematosus: Secondary | ICD-10-CM | POA: Diagnosis not present

## 2017-10-29 ENCOUNTER — Ambulatory Visit (INDEPENDENT_AMBULATORY_CARE_PROVIDER_SITE_OTHER): Payer: PPO | Admitting: Internal Medicine

## 2017-10-29 ENCOUNTER — Encounter: Payer: Self-pay | Admitting: Internal Medicine

## 2017-10-29 VITALS — BP 138/70 | HR 85 | Temp 98.6°F | Resp 14 | Ht 64.0 in | Wt 140.4 lb

## 2017-10-29 DIAGNOSIS — D369 Benign neoplasm, unspecified site: Secondary | ICD-10-CM | POA: Diagnosis not present

## 2017-10-29 DIAGNOSIS — E032 Hypothyroidism due to medicaments and other exogenous substances: Secondary | ICD-10-CM | POA: Diagnosis not present

## 2017-10-29 DIAGNOSIS — E7800 Pure hypercholesterolemia, unspecified: Secondary | ICD-10-CM | POA: Diagnosis not present

## 2017-10-29 DIAGNOSIS — K219 Gastro-esophageal reflux disease without esophagitis: Secondary | ICD-10-CM

## 2017-10-29 DIAGNOSIS — I252 Old myocardial infarction: Secondary | ICD-10-CM | POA: Diagnosis not present

## 2017-10-29 NOTE — Progress Notes (Signed)
Subjective:  Patient ID: Kim Santiago, female    DOB: 04-25-1935  Age: 83 y.o. MRN: [id number]  CC: The primary encounter diagnosis was Iatrogenic hypothyroidism. Diagnoses of Pure hypercholesterolemia, History of heart attack, Adenomatous polyp, and Gastroesophageal reflux disease without esophagitis were also pertinent to this visit.  HPI [name] presents for follow up on multiple issues.   She has been under treatment for breast cancer and at last follow up her Ca 27.29 was elevated.  She was advised to recheck  one month but has not done this yet . During the evaluation she underwent a Chest CT in January 2019 , which noted aortic atherosclerosis,  Stable mild m/s adenopathy .  Had liver enzymes in early Feb checked by dermatology   She would like to reevaluate her list of medications to reduce her pill load.   Has not had GERD in 3 weeks since stoppinng prilosec .Marland Kitchen She has been having leg craps at night.    Outpatient Medications Prior to Visit  Medication Sig Dispense Refill  . aspirin 81 MG tablet Take 81 mg by mouth daily.    . Calcium Carbonate-Vitamin D (CALCIUM + D) 600-200 MG-UNIT TABS Take 2 tablets by mouth 2 (two) times daily.     . cholecalciferol (VITAMIN D) 1000 UNITS tablet Take 2,000 Units by mouth daily.     . diphenhydramine-acetaminophen (TYLENOL PM) 25-500 MG TABS tablet Take 1 tablet by mouth at bedtime as needed.    . folic acid (FOLVITE) 1 MG tablet TAKE 1 TABLET BY MOUTH DAILY EXCEPT ON DAYS WHEN YOU TAKE METHOTREXATE  2  . letrozole (FEMARA) 2.5 MG tablet Take 1 tablet (2.5 mg total) by mouth daily. 90 tablet 2  . levothyroxine (SYNTHROID, LEVOTHROID) 88 MCG tablet Take 1 tablet (88 mcg total) by mouth daily. 90 tablet 4  . losartan (COZAAR) 25 MG tablet Take 1 tablet (25 mg total) by mouth daily. 90 tablet 4  . methotrexate (RHEUMATREX) 2.5 MG tablet Take 2.5 mg by mouth once a week. Caution:Chemotherapy. Protect from light.    . metoprolol  succinate (TOPROL XL) 25 MG 24 hr tablet Take 1 tablet (25 mg total) by mouth daily. 90 tablet 4  . mirtazapine (REMERON) 7.5 MG tablet Take 1 tablet (7.5 mg total) by mouth at bedtime. 90 tablet 1  . polyethylene glycol (MIRALAX / GLYCOLAX) packet Take 17 g by mouth daily.    Marland Kitchen tiZANidine (ZANAFLEX) 2 MG tablet Take 1 tablet (2 mg total) by mouth every 6 (six) hours as needed (for jaw pain). 90 tablet 0  . traMADol (ULTRAM) 50 MG tablet Take 1 tablet (50 mg total) by mouth every 8 (eight) hours as needed. 60 tablet 2  . Bioflavonoid Products (BIOFLEX PO) Take 1 capsule by mouth.    . vitamin B-12 (CYANOCOBALAMIN) 1000 MCG tablet Take 1,000 mcg by mouth daily.    . furosemide (LASIX) 20 MG tablet Once daily as needed for fluid retention (Patient not taking: Reported on 10/29/2017) 90 tablet 1  . omeprazole (PRILOSEC) 20 MG capsule Take 1 capsule (20 mg total) by mouth 2 (two) times daily before a meal. (Patient not taking: Reported on 10/29/2017) 180 capsule 3   No facility-administered medications prior to visit.     Review of Systems;  Patient denies headache, fevers, malaise, unintentional weight loss, skin rash, eye pain, sinus congestion and sinus pain, sore throat, dysphagia,  hemoptysis , cough, dyspnea, wheezing, chest pain, palpitations, orthopnea, edema, abdominal pain,  nausea, melena, diarrhea, constipation, flank pain, dysuria, hematuria, urinary  Frequency, nocturia, numbness, tingling, seizures,  Focal weakness, Loss of consciousness,  Tremor, insomnia, depression, anxiety, and suicidal ideation.      Objective:  BP 138/70 (BP Location: Left Arm, Patient Position: Sitting, Cuff Size: Normal)   Pulse 85   Temp 98.6 F (37 C) (Oral)   Resp 14   Ht 5\' 4"  (1.626 m)   Wt 140 lb 6.4 oz (63.7 kg)   SpO2 92%   BMI 24.10 kg/m   BP Readings from Last 3 Encounters:  10/29/17 138/70  07/14/17 137/88  06/11/17 138/68    Wt Readings from Last 3 Encounters:  10/29/17 140 lb 6.4 oz  (63.7 kg)  07/14/17 143 lb 1.3 oz (64.9 kg)  06/11/17 145 lb 3.2 oz (65.9 kg)    General appearance: alert, cooperative and appears stated age Ears: normal TM's and external ear canals both ears Throat: lips, mucosa, and tongue normal; teeth and gums normal Neck: no adenopathy, no carotid bruit, supple, symmetrical, trachea midline and thyroid not enlarged, symmetric, no tenderness/mass/nodules Back: symmetric, no curvature. ROM normal. No CVA tenderness. Lungs: clear to auscultation bilaterally Heart: regular rate and rhythm, S1, S2 normal, no murmur, click, rub or gallop Abdomen: soft, non-tender; bowel sounds normal; no masses,  no organomegaly Pulses: 2+ and symmetric Skin: Skin color, texture, turgor normal. No rashes or lesions Lymph nodes: Cervical, supraclavicular, and axillary nodes normal.  No results found for: HGBA1C  Lab Results  Component Value Date   CREATININE 0.68 07/14/2017   CREATININE 0.85 02/03/2017   CREATININE 0.64 10/05/2016    Lab Results  Component Value Date   WBC 3.6 07/14/2017   HGB 12.0 07/14/2017   HCT 34.2 (L) 07/14/2017   PLT 170 07/14/2017   GLUCOSE 98 07/14/2017   CHOL 170 06/08/2016   TRIG 86.0 06/08/2016   HDL 44.20 06/08/2016   LDLDIRECT 117.6 12/27/2012   LDLCALC 108 (H) 06/08/2016   ALT 11 (L) 07/14/2017   AST 26 07/14/2017   NA 136 07/14/2017   K 4.4 07/14/2017   CL 102 07/14/2017   CREATININE 0.68 07/14/2017   BUN 15 07/14/2017   CO2 26 07/14/2017   TSH 1.70 02/03/2017    Ct Chest Wo Contrast  Result Date: 09/23/2017 CLINICAL DATA:  Patient with history of left-sided breast cancer. Follow-up evaluation. EXAM: CT CHEST WITHOUT CONTRAST TECHNIQUE: Multidetector CT imaging of the chest was performed following the standard protocol without IV contrast. COMPARISON:  Chest CT 03/15/2017. FINDINGS: Cardiovascular: Heart is mildly enlarged. No pericardial effusion. Thoracic aortic vascular calcifications. Coronary arterial vascular  calcifications. Mediastinum/Nodes: Unchanged mild mediastinal adenopathy including 1.1 cm precarinal lymph node (image 56; series 2) and 9 mm pretracheal lymph node (image 48; series 2). No new or enlarging adenopathy. Small hiatal hernia. Lungs/Pleura: Central airways are patent. Dependent atelectasis within the lower lobes. No large area of pulmonary consolidation. No pleural effusion or pneumothorax. Re demonstrated bibasilar scarring. Stable tiny nodularity right upper lobe. Upper Abdomen: Unremarkable. Musculoskeletal: Thoracic spine degenerative changes. No aggressive or acute appearing osseous lesions. IMPRESSION: 1. Stable mild mediastinal adenopathy. No new or progressive disease. 2. Aortic Atherosclerosis (ICD10-I70.0). Electronically Signed   By: Lovey Newcomer M.D.   On: 09/23/2017 17:11    Assessment & Plan:   Problem List Items Addressed This Visit    Iatrogenic hypothyroidism - Primary    Secondary to total thyroidectomy  for thyroid cancer, which occurred over 5 years ago,   Thyroid  function  Has not been checked sine last May.  Lab Results  Component Value Date   TSH 1.70 02/03/2017          Relevant Orders   TSH   Hyperlipidemia    Untreated historically due to age.  Lab Results  Component Value Date   CHOL 170 06/08/2016   HDL 44.20 06/08/2016   LDLCALC 108 (H) 06/08/2016   LDLDIRECT 117.6 12/27/2012   TRIG 86.0 06/08/2016   CHOLHDL 4 06/08/2016         History of heart attack    Asymptomatic,  With normal cardiac cath in 2013       Adenomatous polyp    She is due for follow up colonoscopy on adenomatous polyp this year       GERD (gastroesophageal reflux disease)    symptoms have been quiescent  Since stopping Prilosec .  Discussed adding an  H2 blocker as a trial of symptoms return. .         A total of 25 minutes of face to face time was spent with patient more than half of which was spent in counselling about the above mentioned conditions  and  coordination of care   I have discontinued Arriah R. Necaise's vitamin B-12, furosemide, omeprazole, and Bioflavonoid Products (BIOFLEX PO). I am also having her maintain her cholecalciferol, Calcium Carbonate-Vitamin D, aspirin, polyethylene glycol, tiZANidine, metoprolol succinate, levothyroxine, losartan, methotrexate, mirtazapine, letrozole, diphenhydramine-acetaminophen, traMADol, and folic acid.  No orders of the defined types were placed in this encounter.   Medications Discontinued During This Encounter  Medication Reason  . furosemide (LASIX) 20 MG tablet Patient has not taken in last 30 days  . omeprazole (PRILOSEC) 20 MG capsule Patient has not taken in last 30 days  . Bioflavonoid Products (BIOFLEX PO)   . vitamin B-12 (CYANOCOBALAMIN) 1000 MCG tablet     Follow-up: Return in about 6 months (around 04/28/2018) for CPE.   [name], MD

## 2017-10-29 NOTE — Patient Instructions (Addendum)
If your GERD returns,  you can use zantac 150 mg or pepcid  20 mg instead of resuming  prilosec    for your leg cramps:    Ok to use magnesium 400 mg dose Or better yet: Try one teaspoon mustard  Soak your legs in hot bath with Epsom salts for 15  minutes before  Bed   You are due for labs now that have been ordered by Dr Mike Gip    Gastroesophageal Reflux Disease, Adult Normally, food travels down the esophagus and stays in the stomach to be digested. If a person has gastroesophageal reflux disease (GERD), food and stomach acid move back up into the esophagus. When this happens, the esophagus becomes sore and swollen (inflamed). Over time, GERD can make small holes (ulcers) in the lining of the esophagus. Follow these instructions at home: Diet  Follow a diet as told by your doctor. You may need to avoid foods and drinks such as: ? Coffee and tea (with or without caffeine). ? Drinks that contain alcohol. ? Energy drinks and sports drinks. ? Carbonated drinks or sodas. ? Chocolate and cocoa. ? Peppermint and mint flavorings. ? Garlic and onions. ? Horseradish. ? Spicy and acidic foods, such as peppers, chili powder, curry powder, vinegar, hot sauces, and BBQ sauce. ? Citrus fruit juices and citrus fruits, such as oranges, lemons, and limes. ? Tomato-based foods, such as red sauce, chili, salsa, and pizza with red sauce. ? Fried and fatty foods, such as donuts, french fries, potato chips, and high-fat dressings. ? High-fat meats, such as hot dogs, rib eye steak, sausage, ham, and bacon. ? High-fat dairy items, such as whole milk, butter, and cream cheese.  Eat small meals often. Avoid eating large meals.  Avoid drinking large amounts of liquid with your meals.  Avoid eating meals during the 2-3 hours before bedtime.  Avoid lying down right after you eat.  Do not exercise right after you eat. General instructions  Pay attention to any changes in your symptoms.  Take  over-the-counter and prescription medicines only as told by your doctor. Do not take aspirin, ibuprofen, or other NSAIDs unless your doctor says it is okay.  Do not use any tobacco products, including cigarettes, chewing tobacco, and e-cigarettes. If you need help quitting, ask your doctor.  Wear loose clothes. Do not wear anything tight around your waist.  Raise (elevate) the head of your bed about 6 inches (15 cm).  Try to lower your stress. If you need help doing this, ask your doctor.  If you are overweight, lose an amount of weight that is healthy for you. Ask your doctor about a safe weight loss goal.  Keep all follow-up visits as told by your doctor. This is important. Contact a doctor if:  You have new symptoms.  You lose weight and you do not know why it is happening.  You have trouble swallowing, or it hurts to swallow.  You have wheezing or a cough that keeps happening.  Your symptoms do not get better with treatment.  You have a hoarse voice. Get help right away if:  You have pain in your arms, neck, jaw, teeth, or back.  You feel sweaty, dizzy, or light-headed.  You have chest pain or shortness of breath.  You throw up (vomit) and your throw up looks like blood or coffee grounds.  You pass out (faint).  Your poop (stool) is bloody or black.  You cannot swallow, drink, or eat. This information is  not intended to replace advice given to you by your health care provider. Make sure you discuss any questions you have with your health care provider. Document Released: 02/10/2008 Document Revised: 01/30/2016 Document Reviewed: 12/19/2014 Elsevier Interactive Patient Education  [employer].

## 2017-10-31 DIAGNOSIS — K219 Gastro-esophageal reflux disease without esophagitis: Secondary | ICD-10-CM | POA: Insufficient documentation

## 2017-10-31 NOTE — Assessment & Plan Note (Signed)
Secondary to total thyroidectomy  for thyroid cancer, which occurred over 5 years ago,   Thyroid function  Has not been checked sine last May.  Lab Results  Component Value Date   TSH 1.70 02/03/2017

## 2017-10-31 NOTE — Assessment & Plan Note (Signed)
Asymptomatic,  With normal cardiac cath in 2013

## 2017-10-31 NOTE — Assessment & Plan Note (Signed)
Untreated historically due to age.  Lab Results  Component Value Date   CHOL 170 06/08/2016   HDL 44.20 06/08/2016   LDLCALC 108 (H) 06/08/2016   LDLDIRECT 117.6 12/27/2012   TRIG 86.0 06/08/2016   CHOLHDL 4 06/08/2016

## 2017-10-31 NOTE — Assessment & Plan Note (Signed)
She is due for follow up colonoscopy on adenomatous polyp this year

## 2017-10-31 NOTE — Assessment & Plan Note (Signed)
symptoms have been quiescent  Since stopping Prilosec .  Discussed adding an  H2 blocker as a trial of symptoms return. Marland Kitchen

## 2017-11-07 ENCOUNTER — Other Ambulatory Visit: Payer: Self-pay | Admitting: Hematology and Oncology

## 2017-11-07 ENCOUNTER — Other Ambulatory Visit: Payer: Self-pay | Admitting: Internal Medicine

## 2017-11-07 DIAGNOSIS — Z170 Estrogen receptor positive status [ER+]: Principal | ICD-10-CM

## 2017-11-07 DIAGNOSIS — C50912 Malignant neoplasm of unspecified site of left female breast: Secondary | ICD-10-CM

## 2017-11-07 DIAGNOSIS — E034 Atrophy of thyroid (acquired): Secondary | ICD-10-CM

## 2017-11-12 DIAGNOSIS — Z4432 Encounter for fitting and adjustment of external left breast prosthesis: Secondary | ICD-10-CM | POA: Diagnosis not present

## 2017-11-12 DIAGNOSIS — C50112 Malignant neoplasm of central portion of left female breast: Secondary | ICD-10-CM | POA: Diagnosis not present

## 2017-11-23 ENCOUNTER — Other Ambulatory Visit: Payer: Self-pay | Admitting: General Surgery

## 2017-11-23 ENCOUNTER — Ambulatory Visit
Admission: RE | Admit: 2017-11-23 | Discharge: 2017-11-23 | Disposition: A | Discharge status: Home / Self Care | Payer: PPO | Source: Ambulatory Visit | Attending: General Surgery | Admitting: General Surgery

## 2017-11-23 DIAGNOSIS — Z1231 Encounter for screening mammogram for malignant neoplasm of breast: Secondary | ICD-10-CM | POA: Diagnosis not present

## 2017-12-02 ENCOUNTER — Ambulatory Visit (INDEPENDENT_AMBULATORY_CARE_PROVIDER_SITE_OTHER): Payer: PPO | Admitting: General Surgery

## 2017-12-02 ENCOUNTER — Encounter: Payer: Self-pay | Admitting: General Surgery

## 2017-12-02 VITALS — BP 132/80 | HR 70 | Resp 12 | Ht 64.0 in | Wt 137.0 lb

## 2017-12-02 DIAGNOSIS — C50112 Malignant neoplasm of central portion of left female breast: Secondary | ICD-10-CM | POA: Diagnosis not present

## 2017-12-02 DIAGNOSIS — Z170 Estrogen receptor positive status [ER+]: Secondary | ICD-10-CM | POA: Diagnosis not present

## 2017-12-02 NOTE — Patient Instructions (Addendum)
The patient is aware to call back for any questions or new concerns. The patient has been asked to return to the office in one year with a unilateral right breast screening mammogram.  

## 2017-12-02 NOTE — Progress Notes (Signed)
Patient ID: Kim Santiago, female   DOB: November 18, 1934, 83 y.o.   MRN: [id number]  Chief Complaint  Patient presents with  . Follow-up    HPI Kim Santiago is a 83 y.o. female who presents for her follow up left breast cancer and a breast evaluation. The most recent right breast mammogram was done on 11/25/2017.  Patient does perform regular self breast checks and gets regular mammograms done.  No new breast issues. She is currently being treated for subcutaneous Lupus by dermatology, Dr Kellie Moor.  HPI  Past Medical History:  Diagnosis Date  . Arthritis   . Breast cancer ([place]) 2017   left mastectomy done 11/2015  . Breast cancer in female [facility]) 11/18/2015   Left: 3.9 cm tumor, T2, 1/2 sentinel nodes positive for macro metastatic disease, N1, 3 negative nodes in the axillary tail, ER+,PR+, Her 2 neu, low Mammoprint score  . Cancer [facility])    thyroid takes levothyroxine  . Chronic kidney disease    UTI  . Genetic screening March 2017.   Mammoprint of left breast cancer: Low risk for recurrence.   . Hypertension   . hypothyroidism    secondary to thyroidectomy for thyroid ca  . Hypothyroidism   . Lupus    subcutaneous  . Menopause 40s   natural, hot flashes and mood lability now gone, off prempro 7 months  . Myocardial infarction ([place]) 2013  . Osteoporosis    Osteopenia  . Rosacea   . Sjoegren syndrome ([place])   . Stress-induced cardiomyopathy September of 2013   EF 35%. Peak troponin was 1.8.    Past Surgical History:  Procedure Laterality Date  . BACK SURGERY    . BREAST BIOPSY Left 10/30/15   positive, done in Dr. Dwyane Luo office  . CARDIAC CATHETERIZATION  05/2012   [facility]. No significant CAD. Ejection fraction of 35% due to stress-induced cardiomyopathy.  . CHOLECYSTECTOMY    . COLONOSCOPY    . DILATION AND CURETTAGE OF UTERUS    . KYPHOSIS SURGERY  Feb 2008   L1, Dr. Mauri Pole  . LUMBAR DISC SURGERY     L4-L5  . MASTECTOMY Left 11/18/2015   positive  . SENTINEL  NODE BIOPSY Left 11/18/2015   Procedure: SENTINEL NODE BIOPSY;  Surgeon: Robert Bellow, MD;  Location: [facility] ORS;  Service: General;  Laterality: Left;  . SHOULDER ARTHROSCOPY  2004   Left, Dr. Jefm Bryant  . SIMPLE MASTECTOMY WITH AXILLARY SENTINEL NODE BIOPSY Left 11/18/2015   Procedure: SIMPLE MASTECTOMY;  Surgeon: Robert Bellow, MD;  Location: [facility] ORS;  Service: General;  Laterality: Left;  . SPINE SURGERY     L4-5 diskectomy  . THYROIDECTOMY     Thyroid Cancer  . TONSILLECTOMY    . TUBAL LIGATION      Family History  Problem Relation Age of Onset  . COPD Father   . Cancer Father        esophageal  . Kidney disease Mother   . Heart disease Mother   . Kidney disease Sister   . Cancer Brother 82       colon cancer (both brothers)  . Heart attack Brother 52  . Heart disease Brother   . Breast cancer Neg Hx     Social History Social History   Tobacco Use  . Smoking status: Never Smoker  . Smokeless tobacco: Never Used  Substance Use Topics  . Alcohol use: No  . Drug use: No    Allergies  Allergen Reactions  .  Amoxicillin Rash  . Codeine Nausea And Vomiting  . Naprosyn [Naproxen] Swelling  . Orudis [Ketoprofen] Hives  . Plaquenil [Hydroxychloroquine] Hives  . Sulfathiazole Rash    Current Outpatient Medications  Medication Sig Dispense Refill  . aspirin 81 MG tablet Take 81 mg by mouth daily.    . Calcium Carbonate-Vitamin D (CALCIUM + D) 600-200 MG-UNIT TABS Take 2 tablets by mouth 2 (two) times daily.     . cholecalciferol (VITAMIN D) 1000 UNITS tablet Take 2,000 Units by mouth daily.     . diphenhydramine-acetaminophen (TYLENOL PM) 25-500 MG TABS tablet Take 1 tablet by mouth at bedtime as needed.    . folic acid (FOLVITE) 1 MG tablet TAKE 1 TABLET BY MOUTH DAILY EXCEPT ON DAYS WHEN YOU TAKE METHOTREXATE  2  . letrozole (FEMARA) 2.5 MG tablet Take 1 tablet by mouth once daily 90 tablet 1  . levothyroxine (SYNTHROID, LEVOTHROID) 88 MCG tablet Take 1  tablet by mouth daily 90 tablet 0  . losartan (COZAAR) 25 MG tablet Take 1 tablet by mouth daily 90 tablet 1  . methotrexate (RHEUMATREX) 2.5 MG tablet Take 2.5 mg by mouth once a week. Caution:Chemotherapy. Protect from light.    . metoprolol succinate (TOPROL-XL) 25 MG 24 hr tablet Take 1 tablet by mouth daily 90 tablet 1  . polyethylene glycol (MIRALAX / GLYCOLAX) packet Take 17 g by mouth daily.    . mirtazapine (REMERON) 7.5 MG tablet Take 1 tablet by mouth at bedtime (Dose change) (Patient not taking: Reported on 12/02/2017) 90 tablet 1  . omeprazole (PRILOSEC) 20 MG capsule Take 1 capsule by mouth twice a day before a meal (Patient not taking: Reported on 12/02/2017) 180 capsule 1  . tiZANidine (ZANAFLEX) 2 MG tablet Take 1 tablet (2 mg total) by mouth every 6 (six) hours as needed (for jaw pain). (Patient not taking: Reported on 12/02/2017) 90 tablet 0  . traMADol (ULTRAM) 50 MG tablet Take 1 tablet (50 mg total) by mouth every 8 (eight) hours as needed. (Patient not taking: Reported on 12/02/2017) 60 tablet 2   No current facility-administered medications for this visit.     Review of Systems Review of Systems  Constitutional: Negative.   Respiratory: Negative.   Cardiovascular: Negative.     Blood pressure 132/80, pulse 70, resp. rate 12, height 5\' 4"  (1.626 m), weight 137 lb (62.1 kg), SpO2 94 %.  Physical Exam Physical Exam  Constitutional: She is oriented to person, place, and time. She appears well-developed and well-nourished.  HENT:  Mouth/Throat: Oropharynx is clear and moist.  Eyes: Conjunctivae are normal. No scleral icterus.  Neck: Neck supple.  Cardiovascular: Normal rate, regular rhythm and normal heart sounds.  Pulmonary/Chest: Effort normal and breath sounds normal. Right breast exhibits no inverted nipple, no mass, no nipple discharge, no skin change and no tenderness.  Left mastectomy site well healed   Lymphadenopathy:    She has no cervical adenopathy.    She  has no axillary adenopathy.  Neurological: She is alert and oriented to person, place, and time.  Skin: Skin is warm and dry.  Psychiatric: Her behavior is normal.    Data Reviewed November 25, 2017 right breast screening mammogram based bar was reviewed: BI-RADS-1.  Assessment    No evidence of recurrent cancer.  Doing well 2 years status post mastectomy.  Bone density scheduled for December 07, 2017.    Plan  The patient has been asked to return to the office in one year  with a unilateral right breast screening mammogram.   HPI, Physical Exam, Assessment and Plan have been scribed under the direction and in the presence of Robert Bellow, MD. Karie Fetch, RN  I have completed the exam and reviewed the above documentation for accuracy and completeness.  I agree with the above.  Haematologist has been used and any errors in dictation or transcription are unintentional.  Hervey Ard, M.D., F.A.C.S.  Forest Gleason Naren Benally 12/03/2017, 5:38 PM

## 2017-12-07 ENCOUNTER — Other Ambulatory Visit: Payer: Self-pay | Admitting: Hematology and Oncology

## 2017-12-07 ENCOUNTER — Ambulatory Visit
Admission: RE | Admit: 2017-12-07 | Discharge: 2017-12-07 | Disposition: A | Discharge status: Home / Self Care | Payer: PPO | Source: Ambulatory Visit | Attending: Urgent Care | Admitting: Urgent Care

## 2017-12-07 DIAGNOSIS — C50912 Malignant neoplasm of unspecified site of left female breast: Secondary | ICD-10-CM | POA: Insufficient documentation

## 2017-12-07 DIAGNOSIS — M85851 Other specified disorders of bone density and structure, right thigh: Secondary | ICD-10-CM | POA: Insufficient documentation

## 2017-12-07 DIAGNOSIS — M85852 Other specified disorders of bone density and structure, left thigh: Secondary | ICD-10-CM

## 2017-12-07 DIAGNOSIS — Z780 Asymptomatic menopausal state: Secondary | ICD-10-CM | POA: Diagnosis not present

## 2017-12-07 DIAGNOSIS — Z170 Estrogen receptor positive status [ER+]: Principal | ICD-10-CM

## 2017-12-08 ENCOUNTER — Inpatient Hospital Stay: Payer: PPO

## 2017-12-08 ENCOUNTER — Inpatient Hospital Stay: Payer: PPO | Attending: Hematology and Oncology | Admitting: Hematology and Oncology

## 2017-12-08 VITALS — BP 130/75 | HR 79 | Temp 97.6°F | Wt 138.0 lb

## 2017-12-08 DIAGNOSIS — Z85850 Personal history of malignant neoplasm of thyroid: Secondary | ICD-10-CM

## 2017-12-08 DIAGNOSIS — Z9012 Acquired absence of left breast and nipple: Secondary | ICD-10-CM | POA: Diagnosis not present

## 2017-12-08 DIAGNOSIS — Z170 Estrogen receptor positive status [ER+]: Secondary | ICD-10-CM | POA: Diagnosis not present

## 2017-12-08 DIAGNOSIS — Z85820 Personal history of malignant melanoma of skin: Secondary | ICD-10-CM | POA: Diagnosis not present

## 2017-12-08 DIAGNOSIS — L931 Subacute cutaneous lupus erythematosus: Secondary | ICD-10-CM | POA: Diagnosis not present

## 2017-12-08 DIAGNOSIS — D649 Anemia, unspecified: Secondary | ICD-10-CM | POA: Diagnosis not present

## 2017-12-08 DIAGNOSIS — Z853 Personal history of malignant neoplasm of breast: Secondary | ICD-10-CM

## 2017-12-08 DIAGNOSIS — D72819 Decreased white blood cell count, unspecified: Secondary | ICD-10-CM | POA: Diagnosis not present

## 2017-12-08 DIAGNOSIS — C50912 Malignant neoplasm of unspecified site of left female breast: Secondary | ICD-10-CM | POA: Insufficient documentation

## 2017-12-08 DIAGNOSIS — I252 Old myocardial infarction: Secondary | ICD-10-CM | POA: Insufficient documentation

## 2017-12-08 DIAGNOSIS — D702 Other drug-induced agranulocytosis: Secondary | ICD-10-CM

## 2017-12-08 DIAGNOSIS — Z79811 Long term (current) use of aromatase inhibitors: Secondary | ICD-10-CM | POA: Diagnosis not present

## 2017-12-08 DIAGNOSIS — R599 Enlarged lymph nodes, unspecified: Secondary | ICD-10-CM | POA: Insufficient documentation

## 2017-12-08 DIAGNOSIS — M85852 Other specified disorders of bone density and structure, left thigh: Secondary | ICD-10-CM | POA: Insufficient documentation

## 2017-12-08 DIAGNOSIS — D709 Neutropenia, unspecified: Secondary | ICD-10-CM | POA: Diagnosis not present

## 2017-12-08 DIAGNOSIS — D696 Thrombocytopenia, unspecified: Secondary | ICD-10-CM | POA: Diagnosis not present

## 2017-12-08 LAB — CBC WITH DIFFERENTIAL/PLATELET
Basophils Absolute: 0 10*3/uL (ref 0–0.1)
Basophils Relative: 1 %
Eosinophils Absolute: 0.1 10*3/uL (ref 0–0.7)
Eosinophils Relative: 4 %
HCT: 31.6 % — ABNORMAL LOW (ref 35.0–47.0)
Hemoglobin: 11 g/dL — ABNORMAL LOW (ref 12.0–16.0)
Lymphocytes Relative: 28 %
Lymphs Abs: 0.7 10*3/uL — ABNORMAL LOW (ref 1.0–3.6)
MCH: 34.6 pg — ABNORMAL HIGH (ref 26.0–34.0)
MCHC: 34.9 g/dL (ref 32.0–36.0)
MCV: 99.2 fL (ref 80.0–100.0)
Monocytes Absolute: 0.2 10*3/uL (ref 0.2–0.9)
Monocytes Relative: 10 %
Neutro Abs: 1.3 10*3/uL — ABNORMAL LOW (ref 1.4–6.5)
Neutrophils Relative %: 57 %
Platelets: 135 10*3/uL — ABNORMAL LOW (ref 150–440)
RBC: 3.18 MIL/uL — ABNORMAL LOW (ref 3.80–5.20)
RDW: 16.8 % — ABNORMAL HIGH (ref 11.5–14.5)
WBC: 2.4 10*3/uL — ABNORMAL LOW (ref 3.6–11.0)

## 2017-12-08 LAB — COMPREHENSIVE METABOLIC PANEL
ALT: 8 U/L — ABNORMAL LOW (ref 14–54)
AST: 21 U/L (ref 15–41)
Albumin: 3.7 g/dL (ref 3.5–5.0)
Alkaline Phosphatase: 69 U/L (ref 38–126)
Anion gap: 5 (ref 5–15)
BUN: 13 mg/dL (ref 6–20)
CO2: 28 mmol/L (ref 22–32)
Calcium: 8.6 mg/dL — ABNORMAL LOW (ref 8.9–10.3)
Chloride: 101 mmol/L (ref 101–111)
Creatinine, Ser: 0.69 mg/dL (ref 0.44–1.00)
GFR calc Af Amer: >60 mL/min (ref >60)
GFR calc non Af Amer: >60 mL/min (ref >60)
Glucose, Bld: 99 mg/dL (ref 65–99)
Potassium: 4.5 mmol/L (ref 3.5–5.1)
Sodium: 134 mmol/L — ABNORMAL LOW (ref 135–145)
Total Bilirubin: 0.6 mg/dL (ref 0.3–1.2)
Total Protein: 6.9 g/dL (ref 6.5–8.1)

## 2017-12-08 NOTE — Progress Notes (Signed)
[facility] day:  12/08/2017  Chief Complaint: Kim Santiago is a 83 y.o. female with stage IIB left breast cancer who is seen for 5 month assessment on Femara.  HPI:  The patient was last seen in the medical oncology clinic on 07/14/2017.  At that time,  she denied any breast concerns.  Exam revealed a stable breast exam and cutaneous lupus.  CBC and CMP were normal.  CA27.29 was 45.7.  Sed rate was 35.  Chest CT on 09/23/2017 revealed stable mild mediastinal adenopathy (1.1 cm precarinal and 0.9 cm pretracheal lymph node).  There was no new or progressive disease.  Right unilateral mammogram on 11/23/2017 revealed no evidence of malignancy.  She saw Dr Bary Castilla on 12/02/2017.  Exam revealed no evidence of recurrent disease.  Bone density study on 12/07/2017 revealed osteopenia with a T-score of -1.6 in the right femoral neck.  During the interim, she denies any health issues except for allergies due to pollens.  She has not been on any antibiotics or had issues with infections.  She is trying to walk every day.  She notes cramps in her legs.  She has come off Prilosec without any issues except for mild heartburn.  She was told to take Zantac, but has not taken it.  She notes a lupus skin outbreak in the past few weeks.  Dr. Jefm Bryant put her on Plaquenil (developed a reaction).  She saw Dr.Arin Isenstein, dermatologist.  She was then put on methotrexate 6 tablets/week.  Her dose was increased to 8 tablets weekly about 3-4 weeks ago.   Past Medical History:  Diagnosis Date  . Arthritis   . Breast cancer ([place]) 2017   left mastectomy done 11/2015  . Breast cancer in female [facility]) 11/18/2015   Left: 3.9 cm tumor, T2, 1/2 sentinel nodes positive for macro metastatic disease, N1, 3 negative nodes in the axillary tail, ER+,PR+, Her 2 neu, low Mammoprint score  . Cancer [facility])    thyroid takes levothyroxine  . Chronic kidney disease    UTI  . Genetic  screening March 2017.   Mammoprint of left breast cancer: Low risk for recurrence.   . Hypertension   . hypothyroidism    secondary to thyroidectomy for thyroid ca  . Hypothyroidism   . Lupus    subcutaneous  . Menopause 40s   natural, hot flashes and mood lability now gone, off prempro 7 months  . Myocardial infarction ([place]) 2013  . Osteoporosis    Osteopenia  . Rosacea   . Sjoegren syndrome ([place])   . Stress-induced cardiomyopathy September of 2013   EF 35%. Peak troponin was 1.8.    Past Surgical History:  Procedure Laterality Date  . BACK SURGERY    . BREAST BIOPSY Left 10/30/15   positive, done in Dr. Dwyane Luo office  . CARDIAC CATHETERIZATION  05/2012   [facility]. No significant CAD. Ejection fraction of 35% due to stress-induced cardiomyopathy.  . CHOLECYSTECTOMY    . COLONOSCOPY    . DILATION AND CURETTAGE OF UTERUS    . KYPHOSIS SURGERY  Feb 2008   L1, Dr. Mauri Pole  . LUMBAR DISC SURGERY     L4-L5  . MASTECTOMY Left 11/18/2015   positive  . SENTINEL NODE BIOPSY Left 11/18/2015   Procedure: SENTINEL NODE BIOPSY;  Surgeon: Robert Bellow, MD;  Location: [facility] ORS;  Service: General;  Laterality: Left;  . SHOULDER ARTHROSCOPY  2004   Left, Dr. Jefm Bryant  .  SIMPLE MASTECTOMY WITH AXILLARY SENTINEL NODE BIOPSY Left 11/18/2015   Procedure: SIMPLE MASTECTOMY;  Surgeon: Robert Bellow, MD;  Location: [facility] ORS;  Service: General;  Laterality: Left;  . SPINE SURGERY     L4-5 diskectomy  . THYROIDECTOMY     Thyroid Cancer  . TONSILLECTOMY    . TUBAL LIGATION      Family History  Problem Relation Age of Onset  . COPD Father   . Cancer Father        esophageal  . Kidney disease Mother   . Heart disease Mother   . Kidney disease Sister   . Cancer Brother 73       colon cancer (both brothers)  . Heart attack Brother 52  . Heart disease Brother   . Breast cancer Neg Hx     Social History:  reports that she has never smoked. She has never used smokeless tobacco. She  reports that she does not drink alcohol or use drugs.  She is from [place], [place].  She was married for 6 years.  Her husband died 49 months ago.  She lives alone in [place].  She has an adopted daughter and 2 grandchildren.  Her brother died of a MI following a cholecystectomy in 12/2015.  The patient is alone today.  Allergies:  Allergies  Allergen Reactions  . Amoxicillin Rash  . Codeine Nausea And Vomiting  . Naprosyn [Naproxen] Swelling  . Orudis [Ketoprofen] Hives  . Plaquenil [Hydroxychloroquine] Hives  . Sulfathiazole Rash    Current Medications: Current Outpatient Medications  Medication Sig Dispense Refill  . aspirin 81 MG tablet Take 81 mg by mouth daily.    . Calcium Carbonate-Vitamin D (CALCIUM + D) 600-200 MG-UNIT TABS Take 2 tablets by mouth 2 (two) times daily.     . cholecalciferol (VITAMIN D) 1000 UNITS tablet Take 2,000 Units by mouth daily.     . diphenhydramine-acetaminophen (TYLENOL PM) 25-500 MG TABS tablet Take 1 tablet by mouth at bedtime as needed.    . folic acid (FOLVITE) 1 MG tablet TAKE 1 TABLET BY MOUTH DAILY EXCEPT ON DAYS WHEN YOU TAKE METHOTREXATE  2  . letrozole (FEMARA) 2.5 MG tablet Take 1 tablet by mouth once daily 90 tablet 1  . levothyroxine (SYNTHROID, LEVOTHROID) 88 MCG tablet Take 1 tablet by mouth daily 90 tablet 0  . losartan (COZAAR) 25 MG tablet Take 1 tablet by mouth daily 90 tablet 1  . methotrexate (RHEUMATREX) 2.5 MG tablet Take 2.5 mg by mouth once a week. Caution:Chemotherapy. Protect from light.    . metoprolol succinate (TOPROL-XL) 25 MG 24 hr tablet Take 1 tablet by mouth daily 90 tablet 1  . polyethylene glycol (MIRALAX / GLYCOLAX) packet Take 17 g by mouth daily.    . mirtazapine (REMERON) 7.5 MG tablet Take 1 tablet by mouth at bedtime (Dose change) (Patient not taking: Reported on 12/02/2017) 90 tablet 1  . traMADol (ULTRAM) 50 MG tablet Take 1 tablet (50 mg total) by mouth every 8 (eight) hours as needed. (Patient not taking:  Reported on 12/02/2017) 60 tablet 2   No current facility-administered medications for this visit.     Review of Systems:  GENERAL:  Feels "ok".  No fevers or sweats.  Weight down 5 pounds. PERFORMANCE STATUS (ECOG):  1 HEENT:  Allergies.  Crumbled tooth per Dr. Phillis Knack (dentist).  No visual changes, runny nose, sore throat, mouth sores or tenderness. Lungs: No shortness of breath or cough.  No hemoptysis. Cardiac:  No chest pain, palpitations, orthopnea, or PND. GI:  Mild heartburn.  No nausea, vomiting, diarrhea, constipation, melena or hematochezia. GU:  No urgency, frequency, dysuria, or hematuria. Musculoskeletal:  Leg cramps.  No back pain.  Little joint pain.  No muscle tenderness. Extremities:  No pain or swelling. Skin:  Hair thinning.  Cutaneous lupus. Neuro:  No headache, numbness or weakness, balance or coordination issues. Endocrine:  No diabetes.  Thyroid disease on Synthroid.  Few hot flashes.  No night sweats. Psych:   No mood changes, anxiety or depression.  Pain:  No focal pain. Review of systems:  All other systems reviewed and found to be negative.  Physical Exam: Blood pressure 130/75, pulse 79, temperature 97.6 F (36.4 C), temperature source Tympanic, weight 138 lb 0.1 oz (62.6 kg). GENERAL:  Well developed, well nourished, woman sitting comfortably in the exam room in no acute distress. MENTAL STATUS:  Alert and oriented to person, place and time. HEAD:  Short gray hair.  Normocephalic, atraumatic, face symmetric, no Cushingoid features. EYES:  Blue rimmed glasses.  Blue eyes.  Pupils equal round and reactive to light and accomodation. No conjunctivitis or scleral icterus. ENT: Oropharynx clear without lesion. Tongue normal. Mucous membranes moist.  RESPIRATORY: Clear to auscultation without rales, wheezes or rhonchi. CARDIOVASCULAR: Regular rate and rhythm without murmur, rub or gallop. No JVD. BREASTS:  Left mastectomy without erythema or nodularity.   Tender right sided fibrocystic changes.  Right breast without masses, skin changes or nipple discharge. ABDOMEN: Soft, non-tender, with active bowel sounds, and no hepatosplenomegaly. SKIN: Rash upper extremities secondary to lupus.  EXTREMITIES: No edema, no skin discoloration or tenderness. No palpable cords. LYMPH NODES:  No palpable cervical, supraclavicular, axillary or inguinal adenopathy  NEUROLOGICAL: Unremarkable. PSYCH: Appropriate.  Labs: Appointment on 12/08/2017  Component Date Value Ref Range Status  . CA 27.29 12/08/2017 43.4* 0.0 - 38.6 U/mL Final   Comment: (NOTE) Siemens Centaur Immunochemiluminometric Methodology [facility]) Values obtained with different assay methods or kits cannot be used interchangeably. Results cannot be interpreted as absolute evidence of the presence or absence of malignant disease. Performed At: [facility] [place], [place] [id number] Rush Farmer MD PO:[id number] Performed at [facility], [address], [place], [place] [address]   . Sodium 12/08/2017 134* 135 - 145 mmol/L Final  . Potassium 12/08/2017 4.5  3.5 - 5.1 mmol/L Final  . Chloride 12/08/2017 101  101 - 111 mmol/L Final  . CO2 12/08/2017 28  22 - 32 mmol/L Final  . Glucose, Bld 12/08/2017 99  65 - 99 mg/dL Final  . BUN 12/08/2017 13  6 - 20 mg/dL Final  . Creatinine, Ser 12/08/2017 0.69  0.44 - 1.00 mg/dL Final  . Calcium 12/08/2017 8.6* 8.9 - 10.3 mg/dL Final  . Total Protein 12/08/2017 6.9  6.5 - 8.1 g/dL Final  . Albumin 12/08/2017 3.7  3.5 - 5.0 g/dL Final  . AST 12/08/2017 21  15 - 41 U/L Final  . ALT 12/08/2017 8* 14 - 54 U/L Final  . Alkaline Phosphatase 12/08/2017 69  38 - 126 U/L Final  . Total Bilirubin 12/08/2017 0.6  0.3 - 1.2 mg/dL Final  . GFR calc non Af Amer 12/08/2017 >60  >60 mL/min Final  . GFR calc Af Amer 12/08/2017 >60  >60 mL/min Final   Comment: (NOTE) The eGFR has been calculated using the CKD EPI  equation. This calculation has not been validated in all clinical situations. eGFR's persistently <60 mL/min  signify possible Chronic Kidney Disease.   Georgiann Hahn gap 12/08/2017 5  5 - 15 Final   Performed at [facility], [address], [place], [place] [address]  . WBC 12/08/2017 2.4* 3.6 - 11.0 K/uL Final  . RBC 12/08/2017 3.18* 3.80 - 5.20 MIL/uL Final  . Hemoglobin 12/08/2017 11.0* 12.0 - 16.0 g/dL Final  . HCT 12/08/2017 31.6* 35.0 - 47.0 % Final  . MCV 12/08/2017 99.2  80.0 - 100.0 fL Final  . MCH 12/08/2017 34.6* 26.0 - 34.0 pg Final  . MCHC 12/08/2017 34.9  32.0 - 36.0 g/dL Final  . RDW 12/08/2017 16.8* 11.5 - 14.5 % Final  . Platelets 12/08/2017 135* 150 - 440 K/uL Final  . Neutrophils Relative % 12/08/2017 57  % Final  . Neutro Abs 12/08/2017 1.3* 1.4 - 6.5 K/uL Final  . Lymphocytes Relative 12/08/2017 28  % Final  . Lymphs Abs 12/08/2017 0.7* 1.0 - 3.6 K/uL Final  . Monocytes Relative 12/08/2017 10  % Final  . Monocytes Absolute 12/08/2017 0.2  0.2 - 0.9 K/uL Final  . Eosinophils Relative 12/08/2017 4  % Final  . Eosinophils Absolute 12/08/2017 0.1  0 - 0.7 K/uL Final  . Basophils Relative 12/08/2017 1  % Final  . Basophils Absolute 12/08/2017 0.0  0 - 0.1 K/uL Final   Performed at [facility], [address] [place], [place] [address]    No visits with results within 3 Day(s) from this visit. Latest known visit with results is:  Admission on 11/18/2015, Discharged on 11/18/2015  Component Date Value Ref Range Status  . SURGICAL PATHOLOGY 11/18/2015    Final-Edited                   Value:Surgical Pathology THIS IS AN ADDENDUM REPORT CASE: [id number] PATIENT: [facility] Surgical Pathology Report Addendum  Reason for Addendum #1:  Molecular studies  SPECIMEN SUBMITTED: A. Breast, left B. Sentinel lymph node #1  #2  CLINICAL HISTORY: None provided  PRE-OPERATIVE DIAGNOSIS: Left breast cancer  POST-OPERATIVE  DIAGNOSIS: Same as pre-op     DIAGNOSIS: A. LEFT BREAST; TOTAL MASTECTOMY: - SOLITARY INVASIVE MAMMARY CARCINOMA OF NO SPECIAL TYPE, 3.9 CM. - DUCTAL CARCINOMA IN SITU. - LYMPH VASCULAR AND DERMAL LYMPHATIC INVASION IDENTIFIED. - THE SURGICAL MARGINS ARE NEGATIVE. - MICROCALCIFICATIONS IN BENIGN AND MALIGNANT TISSUE. - THREE BENIGN AXILLARY LYMPH NODES (0/3). - SEE SUMMARY BELOW.  B. SENTINEL LYMPH NODE #1 AND 2; EXCISION: - ONE OF TWO LYMPH NODES POSITIVE FOR MACRO METASTASES (1/2).   INVASIVE CARCINOMA OF THE BREAST: Complete Excision Specimens InvolvedA: Breast, left B: Sentinel lymph nod                         e #1  #2  SPECIMEN Procedure:     Total mastectomy (including nipple and skin) Lymph Node Sampling:     Sentinel lymph node(s) Specimen Laterality:     Left TUMOR Presence of Invasive Carcinoma:    Histologic Type: Invasive mammary carcinoma of no special type (ductal, not otherwise specified) Histologic Grade:   Glandular (acinar) / tubular differentiation: Score 3 (< 10% of tumor area forming glandular / tubular structures) Nuclear pleomorphism: Score 2 (cells larger than normal with open vesicular nuclei, visible nucleoli, and moderate variability in both size and shape) Mitotic rate:  Score 1 (<=3 mitoses per mm2) Overall grade: Grade 2 (scores of 6 or 7) Ductal Carcinoma In Situ (DCIS):  DCIS is present Negative for extensive intraductal component (EIC) Tumor Size / Focality:   Tumor size: size of largest invasive carcinoma: Greatest dimension of largest focus of invasion > 1 mm: 98m Tumor focality:     Single focus of invasive carcinoma Tumor Extent Macr                         oscopic and Microscopic Extent of Tumor Skin:     Skin invasion: Invasive carcinoma directly invades into the dermis or epidermis without skin ulceration (this does not change the T stage) Skin satellite foci:     Negative for satellite foci Nipple DCIS:   DCIS does not  involve the nipple epidermis Skeletal muscle:    Specimen does not contain skeletal muscle Accessory Tumor Findings Lymph-Vascular Invasion: Present Treatment Effect: Response to Presurgical (Neoadjuvant) Therapy: No known presurgical therapy MARGINS Invasive Carcinoma: Margins negative for invasive carcinoma Distance from closest margin: Distance is > 10 mm Closest Negative Margin(s):   Posterior Ductal Carcinoma In Situ (DCIS):   Margins negative for DCIS (DCIS present in specimen) Distance of DCIS from closest margin:   Distance is > 10 mm Closest Negative Margin(s):   Posterior LYMPH NODES Regional Lymph Nodes:    Number of Sentinel Nodes Examined: Specify number 2 Number of lymph node(s) exam                         ined:  Number 5 Lymph node involvement:  Number of lymph nodes with macrometastases (> 2 mm): Number 1 Number of lymph nodes with micrometastases: Number 0 Number of lymph nodes with isolated tumor cells (<= 0.2 mm and <= 200 cells):   Number 0 STAGE (pTNM) TNM Descriptors:    Not applicable n/a Primary Tumor (Invasive Carcinoma) (pT):     pT2:  tumor > 20 mm but <= 50 mm in greatest dimension Regional lymph nodes (pN) (choose a category based on lymph nodes received with the specimen;  immunohistochemistry and / or molecular studies are not required) Category (pN): pN1a: metastases in 1 to 3 axillary lymph nodes, at least 1 metastasis greater than 2 mm Distant Metastasis (pM): Not applicable - pM cannot be determined from the submitted specimen(s) Comment(s): Invasive carcinoma is identified in tissue between the two masses. The tumor is entirely submitted and is present in 13 slides.   GROSS DESCRIPTION: A. Labeled: Left breast  Time in fixative: 3:10                          PM on 11/18/2015  Cold ischemic time: 40 minutes  Total fixation time: 6 1/2 Hours  Type of mastectomy: Simple  Laterality: Left  Weight of specimen: 707  grams  Size of specimen: 23.5 x 20.5 x 3.9 cm  Orientation of specimen: The letters M and L are written in surgical marker on the skin surface to designate the medial and lateral aspects specimen. Anterior half specimen inked blue, posterior specimen inked black.  Skin ellipse dimensions  description: 15.5 x 11.2 cm, unremarkable  Nipple/ areola: Nipple 1 cm in diameter, flattened. Areola 4.3 cm in diameter, unremarkable  Axillary tail: Absent  Biopsy site(s): 2 discrete biopsy sites identified  Number of discrete masses: 2  Location of mass(es): One mass is located approximately at the 6:00 position behind the nipple areolar complex, and the other is located in the approximate 1:00 position  in the upper outer quadrant.  Distance between masses: Approximately 1.8 cm  Size of mass(es)/biopsy site(s):                          First mass approximately 1 x 0.9 x 0.5 cm, and second mass approximately 1 x 0.7 x 0.8 cm  Description of mass(es)/biopsy site(s): The first mass is surrounded by a large amount of fibrous tissue measures approximately 2.5 x 2.5 x 1.5 cm. Both the first and second masses are extremely poorly defined, stellate, pale tan to white.  Margins: The first mass is grossly located 3.5 cm from the deep margin and the second mass is grossly located 3 cm from the deep margin  Gross involvement of skin/fascia/muscle by tumor: Absent  Description of remaining breast: The rest the breast tissue is predominantly fatty with interspersed areas of dense white fibrous tissue  Lymph nodes: On dissection 3 lymph node candidates identified from 0.5 cm to 1.2 x 0.8 x 0.5 cm.  Block Summary: 1-skin 2-nipple 3-deep margin of resection 4-9 entire first mass including biopsy cavity and adjacent fibrous tissue at the left breast mass 10-12-entire second mass 13-16-breast tissue between                          first and second mass 17-representative section upper inner  quadrant 18-representative section lower inner quadrant 19-representative section upper outer quadrant 20-representative section lower outer quadrant 21- 2 lymph node candidates submitted on 11/20/15 22-1 sectioned lymph node candidate submitted on 11/20/15  B. Labeled: Sentinel node #1 and 2  Tissue fragment(s): 2  Size: 0.4 x 0.3 x 0.2 cm and 1.2 x 0.6 x 0.6 cm  Description: 2 pink red lymph node candidates covered by adipose tissue, smaller inked blue  Entirely submitted in one cassette(s).         Final Diagnosis performed by Delorse Lek, MD.  Electronically signed 11/21/2015 2:59:43PM   The electronic signature indicates that the named Attending Pathologist has evaluated the specimen  Technical component performed at [facility], [address] [place], [place] [address] Lab: [phone] Dir: Darrick Penna. Evette Doffing, MD  Professional component performed at [facility], [facility],                          [place], [place], [place] [address] Lab: [phone] Dir: Dellia Nims. Reuel Derby, MD   ADDENDUM: At Dr. Kem Parkinson request, FFPE tumor tissue (block A8) was submitted to [place], [place], for MammaPrint testing, a 70-gene expression profile. The assay result is reported as Low Risk, +0.192. Please see separate report, requisition number [id number], in [place].    Addendum #1 performed by Bryan Lemma, MD.  Electronically signed 12/24/2015 12:25:01PM    Technical component performed at [place], [address] [place], [place] [address] Lab: [phone] Dir: Darrick Penna. Evette Doffing, MD  Professional component performed at [facility], [facility], [place], [place], [place] [address] Lab: [phone] Dir: Dellia Nims. Rubinas, MD      Assessment:  Kim Santiago is a 83 y.o. female with stage IIB left breast cancer s/p mastectomy on 11/18/2015.  Pathology revealed a 3.9 cm grade II invasive mammary carcinoma.  There was ductal  carcinoma in situ (DCIS).  There was lymphvascular and dermal lymphatic invasion.  Margins were negative.  One of 2 sentinel lymph nodes were positive for macrometastasis.  In addition,  there were 3 benign axillary lymph nodes (total 5 lymph nodes).  Tumor was estrogen receptor positive (100%), progesterone receptor positive (70%), and Her2/neu negative.  Pathologic stage was T2N1a.   Bilateral mammogram and ultrasound on 11/11/2015 revealed a 3 cm left breast mass at the 6 o'clock position (1 cm from the nipple) and a 1.6 cm irregular hypoechoic left breast mass at 1 o'clock position (5 cm from the nipple) with overlying skin thickening.  Right mammogram on 11/18/2016 revealed no evidence of malignancy.  Right mammogram on 11/23/2017 revealed no evidence of malignancy.  MammaPrint on 12/18/2015 revealed a low risk of recurrence with a 5% risk of distant recurrence of 5 years and a 10% recurrence at 10 years.  She declined radiation.  She began Femara on 01/09/2016.  She has a little joint pain and a few hot flashes.  PET scan on 12/23/2016 revealed slightly enlarged right lower paratracheal lymph node and some clustered small AP window lymph nodes with metabolic activity very minimally above background in the mediastinum.  There were no other findings of hypermetabolic lesions in the neck, chest, abdomen, or pelvis.  Chest CT on 03/16/2017 revealed stable mild mediastinal lymphadenopathy.  Largest node was 1.0 cm in the right paratracheal region.  There was no new or progressive disease within thorax.  Chest CT on 09/23/2017 revealed stable mild mediastinal adenopathy (1.1 cm precarinal and 0.9 cm pretracheal lymph node).  There was no new or progressive disease.  CA27.29 has been followed: 30.4 on 11/15/2015, 32.5 on 05/13/2016, 19.0 on 08/12/2016, 40.8 on 11/11/2016, 45.8 on 12/14/2016, 31.6 on 03/17/2017, 45.7 on 07/14/2017, and 43.4 on 12/08/2017.  Bone density study on 12/05/2015 revealed  osteopenia with a T-score of -1.5 in the left femoral neck.  Bone density study on 12/07/2017 revealed osteopenia with a T-score of -1.6 in the right femoral neck.  She is on calcium and vitamin D.  She has a history of a myocardial infarction in 06/2012.  Echocardiogram revealed an EF of 35%.  Follow-up echo on 09/21/2015 revealed an EF of 55-60%.  Patient is unaware of a history of heart failure.   She has a history of thyroid carcinoma 40 years ago treated with I-131.   She has a mild normocytic anemia.  Diet is good.  Colonoscopy on 07/25/2013 revealed 3 small polyps (21m proximal descending colon, 3 mm splenic flexure, 1 mm ascending colon).  She denies any melena or hematochezia.  TSH was normal on 12/26/2015.  She has cutaneous lupus and Sjogren's.  She had a reaction to Plaquenil.  She completed an extended steroid taper. She is on methotrexate 8 tablets every Wednesday (increased 3-4 weeks ago).  Symptomatically, she denies any breast concerns.  Exam reveals a stable breast exam and active cutaneous lupus. She is neutropenic.  WBC is 2400 with an ANC of 1300.  Plan: 1.  Labs today:  CBC with diff, CMP, CA27.29. 2.  Review interval chest CT- stable mild mediastinal adenopathy.  Continue to monitor. 3.  Review interval mammogram- no evidence of malignancy. 4.  Review interval bone density study- osteopenia.  Discuss Prolia.  Patient has dental issues and wishes to postpone. 5.  Discuss leukopenia likely secondary to methotrexate.  Other potential etiologies include lupus, vitamin deficiencies, etc.  Patient recently increased her methotrexate dose (she takes on Wednesdays- already took today).  Discuss immediate follow-up with Dr.Arin Isenstein, dermatologist who prescribed medication.  Discuss plan to hold methotrexate until further advised by Dr. IKellie Moor  Discuss neutropenic precautions.  6.  Discuss mild anemia and thrombocytopenia likely secondary to above. 7.  Continue calcium 1200 mg  and vitamin D 800 IU daily.  8.  Continue Femara.  9.  RTC in 4 months for MD assessment and labs (CBC with diff, CMP, CA27.29).    Lequita Asal, MD  12/08/2017, 4:35 PM

## 2017-12-08 NOTE — Progress Notes (Signed)
Patient states she is fatigued, otherwise, no complaints.

## 2017-12-09 LAB — CA 27.29 (SERIAL MONITOR): CA 27.29: 43.4 U/mL — ABNORMAL HIGH (ref 0.0–38.6)

## 2017-12-10 ENCOUNTER — Encounter: Payer: PPO | Admitting: Internal Medicine

## 2017-12-11 ENCOUNTER — Encounter: Payer: Self-pay | Admitting: Hematology and Oncology

## 2017-12-13 ENCOUNTER — Telehealth: Payer: Self-pay | Admitting: *Deleted

## 2017-12-13 NOTE — Telephone Encounter (Signed)
Attempted to call patient but no answer   Will try again later

## 2017-12-13 NOTE — Telephone Encounter (Signed)
-----   Message from Lequita Asal, MD sent at 12/11/2017  6:52 PM EDT ----- Regarding: Please call patient  Ensure she is being follow-up by her dermatologist regarding her low counts after taking methotrexate.  We can follow counts in our clinic if needed.  M

## 2017-12-15 ENCOUNTER — Telehealth: Payer: Self-pay | Admitting: *Deleted

## 2017-12-15 NOTE — Telephone Encounter (Signed)
-----   Message from Lequita Asal, MD sent at 12/11/2017  6:52 PM EDT ----- Regarding: Please call patient  Ensure she is being follow-up by her dermatologist regarding her low counts after taking methotrexate.  We can follow counts in our clinic if needed.  M

## 2017-12-15 NOTE — Telephone Encounter (Signed)
Called patient to inquire if she is being followed by her dermatologist since taking her methotrexate.  Patient states she called yesterday and has an appointment for this Friday.  She will ask if the dermatologist will check her labs or if we need to. She will call back after her appointment to let us know.

## 2017-12-16 NOTE — Telephone Encounter (Signed)
She did say she was off the methotrexate.

## 2017-12-16 NOTE — Telephone Encounter (Signed)
  Did she state that she is holding her methotrexate?  Let's get her dermatologist who prescribed the MTX on the phone today.  M

## 2017-12-17 DIAGNOSIS — L931 Subacute cutaneous lupus erythematosus: Secondary | ICD-10-CM | POA: Diagnosis not present

## 2017-12-20 DIAGNOSIS — L931 Subacute cutaneous lupus erythematosus: Secondary | ICD-10-CM | POA: Diagnosis not present

## 2017-12-29 ENCOUNTER — Telehealth: Payer: Self-pay | Admitting: *Deleted

## 2017-12-29 NOTE — Telephone Encounter (Signed)
-----   Message from Lequita Asal, MD sent at 12/29/2017 12:17 PM EDT ----- Regarding: FW: lab Results  Contact: [phone]   OK to review labs with patient.  I think we reviewed most at her last visit.  Did she follow up with her doctor prescribing methotrexate as she has low counts?  M  ----- Message ----- From: Secundino Ginger Sent: 12/29/2017   9:31 AM To: Karen Kitchens, NP, Lequita Asal, MD, # Subject: lab Results                                    Kim Santiago wants to know the results of her lab tests. Will someone call her.

## 2017-12-29 NOTE — Telephone Encounter (Signed)
Called patient and LVM that I called to review her lab results.  Informed her that her WBC is a little low.  Asked if she has followed up with her MD who is prescribing her methotrexate?  Asked her to call back to let us know.

## 2017-12-29 NOTE — Telephone Encounter (Signed)
Called patient back to inform her that I was able to obtain her labs from [facility] that Dr. Kellie Moor ordered.  Dr. Mike Gip reviewed them and is pleased with the results.  Patent appreciative of call.

## 2017-12-29 NOTE — Telephone Encounter (Signed)
Patient called back to inform me that she has been taken off methotrexate and is now on 40 mg prednisone daily (20 mg twice daily).  Patient states she had labs @ [facility] on 12-20-17 that were ordered by Dr. Kellie Moor but she has not heard from him regarding the results.  I told her I would see if I could get a copy and have Dr. Mike Gip review them.  Patient states she wants Dr. Mike Gip to order to manage all of her blood work because it is easier for her.

## 2017-12-29 NOTE — Telephone Encounter (Signed)
-----   Message from Lequita Asal, MD sent at 12/29/2017 12:17 PM EDT ----- Regarding: FW: lab Results  Contact: [phone]   OK to review labs with patient.  I think we reviewed most at her last visit.  Did she follow up with her doctor prescribing methotrexate as she has low counts?  M  ----- Message ----- From: Secundino Ginger Sent: 12/29/2017   9:31 AM To: Karen Kitchens, NP, Lequita Asal, MD, # Subject: lab Results                                    Kim Santiago wants to know the results of her lab tests. Will someone call her.

## 2017-12-29 NOTE — Telephone Encounter (Signed)
-----   Message from Lequita Asal, MD sent at 12/29/2017 12:17 PM EDT ----- Regarding: FW: lab Results  Contact: [phone]   OK to review labs with patient.  I think we reviewed most at her last visit.  Did she follow up with her doctor prescribing methotrexate as she has low counts?  M  ----- Message ----- From: Secundino Ginger Sent: 12/29/2017   9:31 AM To: Karen Kitchens, NP, Lequita Asal, MD, # Subject: lab Results                                    Kim Santiago wants to know the results of her lab tests. Will someone call her.

## 2018-01-12 ENCOUNTER — Telehealth: Payer: Self-pay | Admitting: *Deleted

## 2018-01-12 NOTE — Telephone Encounter (Signed)
-----   Message from Lequita Asal, MD sent at 01/12/2018 12:01 PM EDT ----- Regarding: FW: new med Contact: [phone]   Please call the patient and get the name of the new medication.  Then do a drug interaction on Uptodate.  M  ----- Message ----- From: Secundino Ginger Sent: 01/12/2018  10:24 AM To: Karen Kitchens, NP, Lequita Asal, MD, # Subject: new med                                        This pt wants to discuss a new med she is on and make sure it won't interfere with what else she is taking.

## 2018-01-12 NOTE — Telephone Encounter (Signed)
Patient had called for information about a new drug Azathiopine that she has been prescribed.  She wants to know if there are interactions with the other meds she is taking.  I ran uptodate report and there are several interactions.  Spoke with Dr. Mike Gip who advised that patient call the provider that prescribed the medication.  Called patient back and instructed her to call the prescriber.  Verbalized  understanding.

## 2018-01-20 ENCOUNTER — Encounter: Payer: Self-pay | Admitting: Dermatology

## 2018-01-21 ENCOUNTER — Other Ambulatory Visit: Payer: Self-pay | Admitting: Internal Medicine

## 2018-01-21 DIAGNOSIS — E034 Atrophy of thyroid (acquired): Secondary | ICD-10-CM

## 2018-01-27 DIAGNOSIS — L931 Subacute cutaneous lupus erythematosus: Secondary | ICD-10-CM | POA: Diagnosis not present

## 2018-02-06 ENCOUNTER — Observation Stay
Admission: EM | Admit: 2018-02-06 | Discharge: 2018-02-08 | Disposition: A | Discharge status: Home / Self Care | Payer: PPO | Source: Home / Self Care | Attending: Emergency Medicine | Admitting: Emergency Medicine

## 2018-02-06 ENCOUNTER — Encounter: Payer: Self-pay | Admitting: Medical Oncology

## 2018-02-06 ENCOUNTER — Other Ambulatory Visit: Payer: Self-pay

## 2018-02-06 DIAGNOSIS — M3500 Sicca syndrome, unspecified: Secondary | ICD-10-CM | POA: Insufficient documentation

## 2018-02-06 DIAGNOSIS — N390 Urinary tract infection, site not specified: Secondary | ICD-10-CM | POA: Insufficient documentation

## 2018-02-06 DIAGNOSIS — Z853 Personal history of malignant neoplasm of breast: Secondary | ICD-10-CM

## 2018-02-06 DIAGNOSIS — Z7982 Long term (current) use of aspirin: Secondary | ICD-10-CM | POA: Insufficient documentation

## 2018-02-06 DIAGNOSIS — E785 Hyperlipidemia, unspecified: Secondary | ICD-10-CM | POA: Insufficient documentation

## 2018-02-06 DIAGNOSIS — M549 Dorsalgia, unspecified: Secondary | ICD-10-CM | POA: Diagnosis not present

## 2018-02-06 DIAGNOSIS — E039 Hypothyroidism, unspecified: Secondary | ICD-10-CM | POA: Diagnosis not present

## 2018-02-06 DIAGNOSIS — R6889 Other general symptoms and signs: Secondary | ICD-10-CM | POA: Diagnosis not present

## 2018-02-06 DIAGNOSIS — Z79899 Other long term (current) drug therapy: Secondary | ICD-10-CM

## 2018-02-06 DIAGNOSIS — Z79811 Long term (current) use of aromatase inhibitors: Secondary | ICD-10-CM

## 2018-02-06 DIAGNOSIS — A419 Sepsis, unspecified organism: Secondary | ICD-10-CM

## 2018-02-06 DIAGNOSIS — Z880 Allergy status to penicillin: Secondary | ICD-10-CM | POA: Diagnosis not present

## 2018-02-06 DIAGNOSIS — I129 Hypertensive chronic kidney disease with stage 1 through stage 4 chronic kidney disease, or unspecified chronic kidney disease: Secondary | ICD-10-CM | POA: Insufficient documentation

## 2018-02-06 DIAGNOSIS — R509 Fever, unspecified: Secondary | ICD-10-CM | POA: Diagnosis not present

## 2018-02-06 DIAGNOSIS — I361 Nonrheumatic tricuspid (valve) insufficiency: Secondary | ICD-10-CM | POA: Diagnosis not present

## 2018-02-06 DIAGNOSIS — Z981 Arthrodesis status: Secondary | ICD-10-CM | POA: Diagnosis not present

## 2018-02-06 DIAGNOSIS — Z85850 Personal history of malignant neoplasm of thyroid: Secondary | ICD-10-CM | POA: Insufficient documentation

## 2018-02-06 DIAGNOSIS — C50919 Malignant neoplasm of unspecified site of unspecified female breast: Secondary | ICD-10-CM | POA: Diagnosis not present

## 2018-02-06 DIAGNOSIS — Z9012 Acquired absence of left breast and nipple: Secondary | ICD-10-CM

## 2018-02-06 DIAGNOSIS — E890 Postprocedural hypothyroidism: Secondary | ICD-10-CM | POA: Insufficient documentation

## 2018-02-06 DIAGNOSIS — Z842 Family history of other diseases of the genitourinary system: Secondary | ICD-10-CM | POA: Diagnosis present

## 2018-02-06 DIAGNOSIS — R531 Weakness: Secondary | ICD-10-CM | POA: Diagnosis not present

## 2018-02-06 DIAGNOSIS — I252 Old myocardial infarction: Secondary | ICD-10-CM | POA: Insufficient documentation

## 2018-02-06 DIAGNOSIS — R51 Headache: Secondary | ICD-10-CM | POA: Diagnosis not present

## 2018-02-06 DIAGNOSIS — Z170 Estrogen receptor positive status [ER+]: Secondary | ICD-10-CM | POA: Diagnosis not present

## 2018-02-06 DIAGNOSIS — N189 Chronic kidney disease, unspecified: Secondary | ICD-10-CM

## 2018-02-06 DIAGNOSIS — E871 Hypo-osmolality and hyponatremia: Secondary | ICD-10-CM | POA: Diagnosis not present

## 2018-02-06 DIAGNOSIS — Z66 Do not resuscitate: Secondary | ICD-10-CM | POA: Insufficient documentation

## 2018-02-06 DIAGNOSIS — A4159 Other Gram-negative sepsis: Secondary | ICD-10-CM | POA: Diagnosis not present

## 2018-02-06 DIAGNOSIS — M810 Age-related osteoporosis without current pathological fracture: Secondary | ICD-10-CM | POA: Insufficient documentation

## 2018-02-06 DIAGNOSIS — C779 Secondary and unspecified malignant neoplasm of lymph node, unspecified: Secondary | ICD-10-CM | POA: Diagnosis not present

## 2018-02-06 DIAGNOSIS — M329 Systemic lupus erythematosus, unspecified: Secondary | ICD-10-CM | POA: Insufficient documentation

## 2018-02-06 DIAGNOSIS — B961 Klebsiella pneumoniae [K. pneumoniae] as the cause of diseases classified elsewhere: Secondary | ICD-10-CM | POA: Diagnosis not present

## 2018-02-06 DIAGNOSIS — L930 Discoid lupus erythematosus: Secondary | ICD-10-CM | POA: Diagnosis not present

## 2018-02-06 DIAGNOSIS — E878 Other disorders of electrolyte and fluid balance, not elsewhere classified: Secondary | ICD-10-CM | POA: Diagnosis not present

## 2018-02-06 DIAGNOSIS — M40209 Unspecified kyphosis, site unspecified: Secondary | ICD-10-CM | POA: Diagnosis not present

## 2018-02-06 DIAGNOSIS — C50912 Malignant neoplasm of unspecified site of left female breast: Secondary | ICD-10-CM | POA: Diagnosis not present

## 2018-02-06 DIAGNOSIS — D899 Disorder involving the immune mechanism, unspecified: Secondary | ICD-10-CM | POA: Diagnosis not present

## 2018-02-06 DIAGNOSIS — R000 Tachycardia, unspecified: Secondary | ICD-10-CM | POA: Diagnosis not present

## 2018-02-06 DIAGNOSIS — I10 Essential (primary) hypertension: Secondary | ICD-10-CM | POA: Diagnosis not present

## 2018-02-06 DIAGNOSIS — L932 Other local lupus erythematosus: Secondary | ICD-10-CM | POA: Diagnosis not present

## 2018-02-06 DIAGNOSIS — M1990 Unspecified osteoarthritis, unspecified site: Secondary | ICD-10-CM | POA: Diagnosis not present

## 2018-02-06 DIAGNOSIS — R4182 Altered mental status, unspecified: Secondary | ICD-10-CM | POA: Diagnosis not present

## 2018-02-06 DIAGNOSIS — M8580 Other specified disorders of bone density and structure, unspecified site: Secondary | ICD-10-CM | POA: Diagnosis not present

## 2018-02-06 LAB — URINALYSIS, COMPLETE (UACMP) WITH MICROSCOPIC
BILIRUBIN URINE: NEGATIVE
Glucose, UA: NEGATIVE mg/dL
Hgb urine dipstick: NEGATIVE
Ketones, ur: NEGATIVE mg/dL
Nitrite: NEGATIVE
Protein, ur: NEGATIVE mg/dL
SPECIFIC GRAVITY, URINE: 1.013 (ref 1.005–1.030)
pH: 6 (ref 5.0–8.0)

## 2018-02-06 LAB — CBC WITH DIFFERENTIAL/PLATELET
Basophils Absolute: 0 10*3/uL (ref 0–0.1)
Basophils Relative: 1 %
EOS PCT: 1 %
Eosinophils Absolute: 0.1 10*3/uL (ref 0–0.7)
HEMATOCRIT: 30.8 % — AB (ref 35.0–47.0)
Hemoglobin: 11 g/dL — ABNORMAL LOW (ref 12.0–16.0)
LYMPHS PCT: 9 %
Lymphs Abs: 0.4 10*3/uL — ABNORMAL LOW (ref 1.0–3.6)
MCH: 35.7 pg — AB (ref 26.0–34.0)
MCHC: 35.6 g/dL (ref 32.0–36.0)
MCV: 100.3 fL — AB (ref 80.0–100.0)
MONO ABS: 0.4 10*3/uL (ref 0.2–0.9)
MONOS PCT: 8 %
NEUTROS ABS: 3.9 10*3/uL (ref 1.4–6.5)
Neutrophils Relative %: 81 %
Platelets: 175 10*3/uL (ref 150–440)
RBC: 3.07 MIL/uL — ABNORMAL LOW (ref 3.80–5.20)
RDW: 16.4 % — AB (ref 11.5–14.5)
WBC: 4.8 10*3/uL (ref 3.6–11.0)

## 2018-02-06 LAB — COMPREHENSIVE METABOLIC PANEL
ALBUMIN: 3.7 g/dL (ref 3.5–5.0)
ALK PHOS: 79 U/L (ref 38–126)
ALT: 9 U/L — ABNORMAL LOW (ref 14–54)
ANION GAP: 8 (ref 5–15)
AST: 36 U/L (ref 15–41)
BUN: 14 mg/dL (ref 6–20)
CO2: 28 mmol/L (ref 22–32)
Calcium: 8.7 mg/dL — ABNORMAL LOW (ref 8.9–10.3)
Chloride: 101 mmol/L (ref 101–111)
Creatinine, Ser: 0.76 mg/dL (ref 0.44–1.00)
GFR calc Af Amer: >60 mL/min (ref >60)
GFR calc non Af Amer: >60 mL/min (ref >60)
GLUCOSE: 121 mg/dL — AB (ref 65–99)
POTASSIUM: 4.4 mmol/L (ref 3.5–5.1)
SODIUM: 137 mmol/L (ref 135–145)
Total Bilirubin: 0.4 mg/dL (ref 0.3–1.2)
Total Protein: 6.6 g/dL (ref 6.5–8.1)

## 2018-02-06 LAB — LACTIC ACID, PLASMA: Lactic Acid, Venous: 1.4 mmol/L (ref 0.5–1.9)

## 2018-02-06 MED ORDER — AZATHIOPRINE 50 MG PO TABS: 75.0000 mg | ORAL_TABLET | Freq: Every day | ORAL | Status: DC

## 2018-02-06 MED ORDER — ASPIRIN EC 81 MG PO TBEC
81.0000 mg | DELAYED_RELEASE_TABLET | Freq: Every day | ORAL | Status: DC
Start: 2018-02-07 — End: 2018-02-08
  Administered 2018-02-07 – 2018-02-08 (×2): 81 mg via ORAL
  Filled 2018-02-06 (×3): qty 1

## 2018-02-06 MED ORDER — LEVOTHYROXINE SODIUM 88 MCG PO TABS
88.0000 ug | ORAL_TABLET | Freq: Every day | ORAL | Status: DC
  Administered 2018-02-06 – 2018-02-07 (×2): 88 ug via ORAL
  Filled 2018-02-06 (×4): qty 1

## 2018-02-06 MED ORDER — DIPHENHYDRAMINE-APAP (SLEEP) 25-500 MG PO TABS: 1.0000 | ORAL_TABLET | Freq: Every day | ORAL | Status: DC

## 2018-02-06 MED ORDER — DIPHENHYDRAMINE HCL 25 MG PO CAPS
25.0000 mg | ORAL_CAPSULE | Freq: Every day | ORAL | Status: DC
  Administered 2018-02-06 – 2018-02-07 (×2): 25 mg via ORAL
  Filled 2018-02-06 (×2): qty 1

## 2018-02-06 MED ORDER — ONDANSETRON HCL 4 MG/2ML IJ SOLN: 4.0000 mg | Freq: Four times a day (QID) | INTRAMUSCULAR | Status: DC | PRN

## 2018-02-06 MED ORDER — VITAMIN D 1000 UNITS PO TABS
2000.0000 [IU] | ORAL_TABLET | Freq: Every day | ORAL | Status: DC
  Administered 2018-02-07: 2000 [IU] via ORAL
  Filled 2018-02-06 (×2): qty 2

## 2018-02-06 MED ORDER — POLYETHYLENE GLYCOL 3350 17 G PO PACK
17.0000 g | PACK | Freq: Every day | ORAL | Status: DC
  Administered 2018-02-06 – 2018-02-07 (×2): 17 g via ORAL
  Filled 2018-02-06 (×3): qty 1

## 2018-02-06 MED ORDER — SODIUM CHLORIDE 0.9 % IV SOLN
1.0000 g | INTRAVENOUS | Status: DC
  Administered 2018-02-07: 1 g via INTRAVENOUS
  Filled 2018-02-06: qty 1
  Filled 2018-02-06: qty 10

## 2018-02-06 MED ORDER — TRAMADOL HCL 50 MG PO TABS
50.0000 mg | ORAL_TABLET | Freq: Three times a day (TID) | ORAL | Status: DC | PRN
Start: 2018-02-06 — End: 2018-02-08

## 2018-02-06 MED ORDER — LOSARTAN POTASSIUM 25 MG PO TABS
25.0000 mg | ORAL_TABLET | Freq: Every day | ORAL | Status: DC
  Administered 2018-02-07 – 2018-02-08 (×2): 25 mg via ORAL
  Filled 2018-02-06 (×2): qty 1

## 2018-02-06 MED ORDER — MIRTAZAPINE 15 MG PO TABS
7.5000 mg | ORAL_TABLET | Freq: Every day | ORAL | Status: DC
  Filled 2018-02-06: qty 0.5

## 2018-02-06 MED ORDER — CALCIUM CARBONATE-VITAMIN D 600-200 MG-UNIT PO TABS: 2.0000 | ORAL_TABLET | Freq: Two times a day (BID) | ORAL | Status: DC

## 2018-02-06 MED ORDER — CALCIUM CARBONATE-VITAMIN D 500-200 MG-UNIT PO TABS
2.0000 | ORAL_TABLET | Freq: Every day | ORAL | Status: DC
  Administered 2018-02-07: 2 via ORAL
  Filled 2018-02-06 (×2): qty 2

## 2018-02-06 MED ORDER — ACETAMINOPHEN 650 MG RE SUPP: 650.0000 mg | Freq: Four times a day (QID) | RECTAL | Status: DC | PRN

## 2018-02-06 MED ORDER — ENOXAPARIN SODIUM 40 MG/0.4ML ~~LOC~~ SOLN
40.0000 mg | SUBCUTANEOUS | Status: DC
  Administered 2018-02-06 – 2018-02-07 (×2): 40 mg via SUBCUTANEOUS
  Filled 2018-02-06 (×2): qty 0.4

## 2018-02-06 MED ORDER — MIRTAZAPINE 15 MG PO TABS: 7.5000 mg | ORAL_TABLET | Freq: Every evening | ORAL | Status: DC | PRN

## 2018-02-06 MED ORDER — ACETAMINOPHEN 325 MG PO TABS
650.0000 mg | ORAL_TABLET | Freq: Four times a day (QID) | ORAL | Status: DC | PRN
  Administered 2018-02-06 – 2018-02-07 (×3): 650 mg via ORAL
  Filled 2018-02-06 (×3): qty 2

## 2018-02-06 MED ORDER — METOPROLOL SUCCINATE ER 25 MG PO TB24
25.0000 mg | ORAL_TABLET | Freq: Every day | ORAL | Status: DC
  Administered 2018-02-07 – 2018-02-08 (×2): 25 mg via ORAL
  Filled 2018-02-06 (×2): qty 1

## 2018-02-06 MED ORDER — ONDANSETRON HCL 4 MG PO TABS: 4.0000 mg | ORAL_TABLET | Freq: Four times a day (QID) | ORAL | Status: DC | PRN

## 2018-02-06 MED ORDER — LETROZOLE 2.5 MG PO TABS
2.5000 mg | ORAL_TABLET | Freq: Every day | ORAL | Status: DC
  Administered 2018-02-06 – 2018-02-07 (×2): 2.5 mg via ORAL
  Filled 2018-02-06 (×4): qty 1

## 2018-02-06 MED ORDER — SODIUM CHLORIDE 0.9 % IV SOLN
1.0000 g | Freq: Once | INTRAVENOUS | Status: AC
  Administered 2018-02-06: 1 g via INTRAVENOUS
  Filled 2018-02-06: qty 10

## 2018-02-06 MED ORDER — SODIUM CHLORIDE 0.9 % IV BOLUS
1000.0000 mL | Freq: Once | INTRAVENOUS | Status: AC
  Administered 2018-02-06: 1000 mL via INTRAVENOUS

## 2018-02-06 NOTE — ED Notes (Signed)
Patient transported to 208

## 2018-02-06 NOTE — ED Triage Notes (Addendum)
Pt reports she began taking a new medication for her lupus 3 weeks ago called azathioprine. Pt reports since she has been placed on this med she has not been feeling "well" and has been feeling fatigued. Pt reports today about 3 hrs pta she began running fever of 100.1. Tylenol taken at 1700. Pt also c/o pain to back of head, pain worsens when she looks downward.

## 2018-02-06 NOTE — H&P (Signed)
[facility] NAME: Kim Santiago    MR#:  [id number]  DATE OF BIRTH:  31-Mar-1935  DATE OF ADMISSION:  02/06/2018  PRIMARY CARE PHYSICIAN: [name], MD   REQUESTING/REFERRING PHYSICIAN: Dr. Nance Pear  CHIEF COMPLAINT:   Chief Complaint  Patient presents with  . Fever  . Fatigue    HISTORY OF PRESENT ILLNESS:  Kim Santiago  is a 83 y.o. female with a known history of breast cancer status post mastectomy, hypothyroidism, cutaneous lupus, history of Sjogren's syndrome, osteoporosis, stress-induced cardiomyopathy who presents to the hospital due to fever, chills and generalized malaise.  Patient says she developed some chills earlier yesterday and today measured her temperature which was initially 100.4 and then went up to as high as 102.  She was a bit concerned and therefore came to the ER for further evaluation.  Patient has been recently started on azathioprine for her cutaneous lupus by her dermatologist.  She thought initially that the drug was causing her symptoms and called her dermatologist who recommended she come to the ER for further evaluation.  Patient denies any cough, congestion, upper respiratory symptoms, she also denies any dysuria, urinary frequency.  In the emergency room patient underwent a urinalysis which was consistent with a UTI.  Hospitalist services were contacted for treatment evaluation.  Patient continued to have a low-grade fever in the ER and was tachycardic and therefore met criteria for sepsis and therefore hospitalist services were contacted for admission.  PAST MEDICAL HISTORY:   Past Medical History:  Diagnosis Date  . Arthritis   . Breast cancer ([employer]) 2017   left mastectomy done 11/2015  . Breast cancer in female [facility]) 11/18/2015   Left: 3.9 cm tumor, T2, 1/2 sentinel nodes positive for macro metastatic disease, N1, 3 negative nodes in the axillary tail, ER+,PR+, Her 2 neu, low Mammoprint  score  . Cancer [facility])    thyroid takes levothyroxine  . Chronic kidney disease    UTI  . Genetic screening March 2017.   Mammoprint of left breast cancer: Low risk for recurrence.   . Hypertension   . hypothyroidism    secondary to thyroidectomy for thyroid ca  . Hypothyroidism   . Lupus ([facility])    subcutaneous  . Menopause 40s   natural, hot flashes and mood lability now gone, off prempro 7 months  . Myocardial infarction ([place]) 2013  . Osteoporosis    Osteopenia  . Rosacea   . Sjoegren syndrome   . Stress-induced cardiomyopathy September of 2013   EF 35%. Peak troponin was 1.8.    PAST SURGICAL HISTORY:   Past Surgical History:  Procedure Laterality Date  . BACK SURGERY    . BREAST BIOPSY Left 10/30/15   positive, done in Dr. Dwyane Luo office  . CARDIAC CATHETERIZATION  05/2012   [facility]. No significant CAD. Ejection fraction of 35% due to stress-induced cardiomyopathy.  . CHOLECYSTECTOMY    . COLONOSCOPY    . DILATION AND CURETTAGE OF UTERUS    . KYPHOSIS SURGERY  Feb 2008   L1, Dr. Mauri Pole  . LUMBAR DISC SURGERY     L4-L5  . MASTECTOMY Left 11/18/2015   positive  . SENTINEL NODE BIOPSY Left 11/18/2015   Procedure: SENTINEL NODE BIOPSY;  Surgeon: Robert Bellow, MD;  Location: [facility] ORS;  Service: General;  Laterality: Left;  . SHOULDER ARTHROSCOPY  2004   Left, Dr. Jefm Bryant  . SIMPLE MASTECTOMY WITH AXILLARY SENTINEL NODE  BIOPSY Left 11/18/2015   Procedure: SIMPLE MASTECTOMY;  Surgeon: Robert Bellow, MD;  Location: [facility] ORS;  Service: General;  Laterality: Left;  . SPINE SURGERY     L4-5 diskectomy  . THYROIDECTOMY     Thyroid Cancer  . TONSILLECTOMY    . TUBAL LIGATION      SOCIAL HISTORY:   Social History   Tobacco Use  . Smoking status: Never Smoker  . Smokeless tobacco: Never Used  Substance Use Topics  . Alcohol use: No    FAMILY HISTORY:   Family History  Problem Relation Age of Onset  . COPD Father   . Cancer Father        esophageal  .  Kidney disease Mother   . Heart disease Mother   . Kidney disease Sister   . Cancer Brother 86       colon cancer (both brothers)  . Heart attack Brother 28  . Heart disease Brother   . Breast cancer Neg Hx     DRUG ALLERGIES:   Allergies  Allergen Reactions  . Amoxicillin Rash    Has patient had a PCN reaction causing immediate rash, facial/tongue/throat swelling, SOB or lightheadedness with hypotension: Unknown Has patient had a PCN reaction causing severe rash involving mucus membranes or skin necrosis: Unknown Has patient had a PCN reaction that required hospitalization: Unknown Has patient had a PCN reaction occurring within the last 10 years: Unknown If all of the above answers are "NO", then may proceed with Cephalosporin use.   . Codeine Nausea And Vomiting  . Naprosyn [Naproxen] Swelling  . Orudis [Ketoprofen] Hives  . Plaquenil [Hydroxychloroquine] Hives  . Sulfathiazole Rash    REVIEW OF SYSTEMS:   Review of Systems  Constitutional: Positive for chills, fever and malaise/fatigue. Negative for weight loss.  HENT: Negative for congestion, nosebleeds and tinnitus.   Eyes: Negative for blurred vision, double vision and redness.  Respiratory: Negative for cough, hemoptysis and shortness of breath.   Cardiovascular: Negative for chest pain, orthopnea, leg swelling and PND.  Gastrointestinal: Negative for abdominal pain, diarrhea, melena, nausea and vomiting.  Genitourinary: Negative for dysuria, hematuria and urgency.  Musculoskeletal: Negative for falls and joint pain.  Neurological: Negative for dizziness, tingling, sensory change, focal weakness, seizures, weakness and headaches.  Endo/Heme/Allergies: Negative for polydipsia. Does not bruise/bleed easily.  Psychiatric/Behavioral: Negative for depression and memory loss. The patient is not nervous/anxious.     MEDICATIONS AT HOME:   Prior to Admission medications   Medication Sig Start Date End Date Taking?  Authorizing Provider  aspirin 81 MG tablet Take 81 mg by mouth daily.   Yes [provider]  AZASAN 75 MG TABS Take 1 tablet by mouth daily. 01/11/18  Yes [provider]  Calcium Carbonate-Vitamin D (CALCIUM + D) 600-200 MG-UNIT TABS Take 2 tablets by mouth 2 (two) times daily.    Yes [provider]  cholecalciferol (VITAMIN D) 1000 UNITS tablet Take 2,000 Units by mouth daily.    Yes [provider]  diphenhydramine-acetaminophen (TYLENOL PM) 25-500 MG TABS tablet Take 1 tablet by mouth at bedtime.    Yes [provider]  letrozole [facility]) 2.5 MG tablet Take 1 tablet by mouth once daily 11/07/17  Yes Corcoran, Drue Second, MD  levothyroxine (SYNTHROID, LEVOTHROID) 88 MCG tablet Take 1 tablet by mouth daily 01/21/18  Yes [name], MD  losartan (COZAAR) 25 MG tablet Take 1 tablet by mouth daily 11/08/17  Yes [name], MD  metoprolol succinate (TOPROL-XL) 25 MG 24 hr tablet Take 1 tablet by mouth daily 11/08/17  Yes [name], MD  mirtazapine (REMERON) 7.5 MG tablet Take 1 tablet by mouth at bedtime (Dose change) 11/08/17  Yes [name], MD  polyethylene glycol (MIRALAX / GLYCOLAX) packet Take 17 g by mouth daily.   Yes [provider]  traMADol (ULTRAM) 50 MG tablet Take 1 tablet (50 mg total) by mouth every 8 (eight) hours as needed. 06/11/17  Yes [name], MD      VITAL SIGNS:  Blood pressure 125/64, pulse (!) 109, temperature 98 F (36.7 C), temperature source Oral, resp. rate 18, height '5\' 4"'$  (1.626 m), weight 61.2 kg (135 lb), SpO2 95 %.  PHYSICAL EXAMINATION:  Physical Exam  GENERAL:  83 y.o.-year-old patient lying in the bed in no acute distress.  EYES: Pupils equal, round, reactive to light and accommodation. No scleral icterus. Extraocular muscles intact.  HEENT: Head atraumatic, normocephalic. Oropharynx and nasopharynx clear. No oropharyngeal erythema, moist oral mucosa  NECK:  Supple, no jugular venous  distention. No thyroid enlargement, no tenderness.  LUNGS: Normal breath sounds bilaterally, no wheezing, rales, rhonchi. No use of accessory muscles of respiration.  CARDIOVASCULAR: S1, S2 RRR. No murmurs, rubs, gallops, clicks.  ABDOMEN: Soft, nontender, nondistended. Bowel sounds present. No organomegaly or mass.  EXTREMITIES: No pedal edema, cyanosis, or clubbing. + 2 pedal & radial pulses b/l.   NEUROLOGIC: Cranial nerves II through XII are intact. No focal Motor or sensory deficits appreciated b/l PSYCHIATRIC: The patient is alert and oriented x 3.  SKIN: No obvious rash, lesion, or ulcer.   LABORATORY PANEL:   CBC Recent Labs  Lab 02/06/18 1734  WBC 4.8  HGB 11.0*  HCT 30.8*  PLT 175   ------------------------------------------------------------------------------------------------------------------  Chemistries  Recent Labs  Lab 02/06/18 1734  NA 137  K 4.4  CL 101  CO2 28  GLUCOSE 121*  BUN 14  CREATININE 0.76  CALCIUM 8.7*  AST 36  ALT 9*  ALKPHOS 79  BILITOT 0.4   ------------------------------------------------------------------------------------------------------------------  Cardiac Enzymes No results for input(s): TROPONINI in the last 168 hours. ------------------------------------------------------------------------------------------------------------------  RADIOLOGY:  No results found.   IMPRESSION AND PLAN:   83 year old female with past medical history of breast cancer, hypothyroidism, Sjogren's syndrome, cutaneous lupus, hypertension, osteoporosis who presents to the hospital due to chills, fever, headache and malaise and noted to have a urinary tract infection.  1.  Sepsis-patient met criteria given her fever, tachycardia and abnormal urinalysis here in the ER. - We will treat the patient supportively with IV fluids, IV ceftriaxone for the UTI, follow cultures and fever curve and her hemodynamics.  2.  Urinary tract infection-we will treat  patient with IV ceftriaxone, follow urine cultures.  3.  History of breast cancer-continue Femara.  4.  Hypothyroidism-continue Synthroid.  5.  Essential hypertension-continue Toprol, losartan.  6.  History of cutaneous lupus-continue her azathioprine.  7.  Osteoporosis-continue calcium with vitamin D supplements.  Patient remains afebrile tomorrow and is hemodynamically stable she can likely be discharged on oral antibiotics.  All the records are reviewed and case discussed with ED provider. Management plans discussed with the patient, family and they are in agreement.  CODE STATUS: Full code  TOTAL TIME TAKING CARE OF THIS PATIENT: 40 minutes.    Henreitta Leber M.D on 02/06/2018 at 7:19 PM  Between 7am to 6pm - Pager - [phone]  After 6pm go to [email or web address] - [place]  [facility]  Office  [phone]  CC: Primary care physician; [name], MD

## 2018-02-06 NOTE — Progress Notes (Signed)
Patient is admitted for fever and fatigue. Patient is tachypneic w/ systolic BP 396'D. UA positive for leukocytes UCx: many bacteria  Patient is on azathioprine 75 mg daily for cutaneous lupus. Spoke to MD to hold AZA for now considering patient has an active infx.  MD notified and agrees w/ plan. Will hold AZA until 06/6.  Tobie Lords, PharmD, BCPS Clinical Pharmacist 02/06/2018

## 2018-02-06 NOTE — ED Notes (Signed)
Pt reports not feeling well this morning, states she took her temp because she felt chills and it was elevated. Pt states she took some Tylenol 1000mg  at home.    Dr. Verdell Carmine in room at this time speaking to patient.  First set of blood cultures drawn at [place].

## 2018-02-06 NOTE — ED Notes (Signed)
EDP in room, pt given cup of water

## 2018-02-06 NOTE — ED Provider Notes (Signed)
[facility] Emergency Department Provider Note  ____________________________________________   I have reviewed the triage vital signs and the nursing notes.   HISTORY  Chief Complaint Fever and Fatigue   History limited by: Not Limited   HPI Terrian R Motter is a 83 y.o. female who presents to the emergency department today because of concerns for fever.  The patient states that she has been feeling fatigued for the past few weeks.  She has been attributed to a new medication she was put on for lupus.  The patient states however today she noticed chills.  When she took her temperature was elevated.  T-max of 102.  Patient did have one episode of headache.  Described as being located in the occiput and started after she lowered her head.  By the time my exam however this had resolved.  She did try taking some Tylenol at home.  She denies any chest pain or cough.  She denies any shortness of breath.  She denies any nausea vomiting or diarrhea.   Per medical record review patient has a history of lupos and breast cancer.   Past Medical History:  Diagnosis Date  . Arthritis   . Breast cancer ([place]) 2017   left mastectomy done 11/2015  . Breast cancer in female [facility]) 11/18/2015   Left: 3.9 cm tumor, T2, 1/2 sentinel nodes positive for macro metastatic disease, N1, 3 negative nodes in the axillary tail, ER+,PR+, Her 2 neu, low Mammoprint score  . Cancer [facility])    thyroid takes levothyroxine  . Chronic kidney disease    UTI  . Genetic screening March 2017.   Mammoprint of left breast cancer: Low risk for recurrence.   . Hypertension   . hypothyroidism    secondary to thyroidectomy for thyroid ca  . Hypothyroidism   . Lupus (HCC)    subcutaneous  . Menopause 40s   natural, hot flashes and mood lability now gone, off prempro 7 months  . Myocardial infarction ([place]) 2013  . Osteoporosis    Osteopenia  . Rosacea   . Sjoegren syndrome   . Stress-induced  cardiomyopathy September of 2013   EF 35%. Peak troponin was 1.8.    Patient Active Problem List   Diagnosis Date Noted  . GERD (gastroesophageal reflux disease) 10/31/2017  . Hand joint pain 06/13/2017  . Vaginal atrophy 05/11/2017  . Lymph node enlargement 03/17/2017  . Venous insufficiency of both lower extremities 01/10/2017  . Discoid lupus 06/26/2016  . TMJ syndrome 03/31/2016  . Vertigo 03/12/2016  . Hearing loss 03/12/2016  . Osteopenia 12/05/2015  . Malignant neoplasm of left female breast ([place]) 11/26/2015  . Breast cancer, female, left 11/05/2015  . Advanced directives, counseling/discussion 09/18/2015  . History of heart attack 06/12/2014  . Hx of thyroid cancer 06/12/2014  . Adenomatous polyp 06/12/2014  . Major depressive disorder, recurrent episode, moderate ([place]) 05/29/2014  . Grief 04/08/2014  . Sjogren's syndrome ([place]) 03/09/2014  . Insomnia due to stress 03/06/2014  . Cervicalgia of occipito-atlanto-axial region 09/22/2013  . Hyperlipidemia 12/28/2012  . Medicare annual wellness visit, subsequent 12/28/2012  . Iatrogenic hypothyroidism 10/02/2011    Past Surgical History:  Procedure Laterality Date  . BACK SURGERY    . BREAST BIOPSY Left 10/30/15   positive, done in Dr. Dwyane Luo office  . CARDIAC CATHETERIZATION  05/2012   [facility]. No significant CAD. Ejection fraction of 35% due to stress-induced cardiomyopathy.  . CHOLECYSTECTOMY    . COLONOSCOPY    . DILATION  AND CURETTAGE OF UTERUS    . KYPHOSIS SURGERY  Feb 2008   L1, Dr. Mauri Pole  . LUMBAR DISC SURGERY     L4-L5  . MASTECTOMY Left 11/18/2015   positive  . SENTINEL NODE BIOPSY Left 11/18/2015   Procedure: SENTINEL NODE BIOPSY;  Surgeon: Robert Bellow, MD;  Location: [facility] ORS;  Service: General;  Laterality: Left;  . SHOULDER ARTHROSCOPY  2004   Left, Dr. Jefm Bryant  . SIMPLE MASTECTOMY WITH AXILLARY SENTINEL NODE BIOPSY Left 11/18/2015   Procedure: SIMPLE MASTECTOMY;  Surgeon: Robert Bellow,  MD;  Location: [facility] ORS;  Service: General;  Laterality: Left;  . SPINE SURGERY     L4-5 diskectomy  . THYROIDECTOMY     Thyroid Cancer  . TONSILLECTOMY    . TUBAL LIGATION      Prior to Admission medications   Medication Sig Start Date End Date Taking? Authorizing Provider  aspirin 81 MG tablet Take 81 mg by mouth daily.    [provider]  Calcium Carbonate-Vitamin D (CALCIUM + D) 600-200 MG-UNIT TABS Take 2 tablets by mouth 2 (two) times daily.     [provider]  cholecalciferol (VITAMIN D) 1000 UNITS tablet Take 2,000 Units by mouth daily.     [provider]  diphenhydramine-acetaminophen (TYLENOL PM) 25-500 MG TABS tablet Take 1 tablet by mouth at bedtime as needed.    [provider]  folic acid (FOLVITE) 1 MG tablet TAKE 1 TABLET BY MOUTH DAILY EXCEPT ON DAYS WHEN YOU TAKE METHOTREXATE 10/22/17   [provider]  letrozole [facility]) 2.5 MG tablet Take 1 tablet by mouth once daily 11/07/17   Lequita Asal, MD  levothyroxine (SYNTHROID, LEVOTHROID) 88 MCG tablet Take 1 tablet by mouth daily 01/21/18   [name], MD  losartan (COZAAR) 25 MG tablet Take 1 tablet by mouth daily 11/08/17   [name], MD  methotrexate (RHEUMATREX) 2.5 MG tablet Take 2.5 mg by mouth once a week. Caution:Chemotherapy. Protect from light.    [provider]  metoprolol succinate (TOPROL-XL) 25 MG 24 hr tablet Take 1 tablet by mouth daily 11/08/17   [name], MD  mirtazapine (REMERON) 7.5 MG tablet Take 1 tablet by mouth at bedtime (Dose change) Patient not taking: Reported on 12/02/2017 11/08/17   [name], MD  polyethylene glycol (MIRALAX / GLYCOLAX) packet Take 17 g by mouth daily.    [provider]  traMADol (ULTRAM) 50 MG tablet Take 1 tablet (50 mg total) by mouth every 8 (eight) hours as needed. Patient not taking: Reported on 12/02/2017 06/11/17   [name], MD    Allergies Amoxicillin; Codeine; Naprosyn  [naproxen]; Orudis [ketoprofen]; Plaquenil [hydroxychloroquine]; and Sulfathiazole  Family History  Problem Relation Age of Onset  . COPD Father   . Cancer Father        esophageal  . Kidney disease Mother   . Heart disease Mother   . Kidney disease Sister   . Cancer Brother 3       colon cancer (both brothers)  . Heart attack Brother 72  . Heart disease Brother   . Breast cancer Neg Hx     Social History Social History   Tobacco Use  . Smoking status: Never Smoker  . Smokeless tobacco: Never Used  Substance Use Topics  . Alcohol use: No  . Drug use: No    Review of Systems Constitutional: Positive for fever and chills.  Eyes: No visual  changes. ENT: No sore throat. Cardiovascular: Denies chest pain. Respiratory: Denies shortness of breath. Gastrointestinal: No abdominal pain.  No nausea, no vomiting.  No diarrhea.   Genitourinary: Negative for dysuria. Musculoskeletal: Positive for back pain ( a couple of weeks after fall) Skin: Negative for rash. Neurological: Positive for headache  ____________________________________________   PHYSICAL EXAM:  VITAL SIGNS: ED Triage Vitals  Enc Vitals Group     BP 02/06/18 1723 (!) 165/58     Pulse Rate 02/06/18 1723 (!) 120     Resp 02/06/18 1723 18     Temp 02/06/18 1723 100.2 F (37.9 C)     Temp Source 02/06/18 1723 Oral     SpO2 02/06/18 1723 99 %     Weight 02/06/18 1724 135 lb (61.2 kg)     Height 02/06/18 1724 5\' 4"  (1.626 m)     Head Circumference --      Peak Flow --      Pain Score 02/06/18 1724 7   Constitutional: Alert and oriented.  Eyes: Conjunctivae are normal.  ENT      Head: Normocephalic and atraumatic.      Nose: No congestion/rhinnorhea.      Mouth/Throat: Mucous membranes are moist.      Neck: No stridor. Hematological/Lymphatic/Immunilogical: No cervical lymphadenopathy. Cardiovascular: Tachycardic, regular rhythm.  No murmurs, rubs, or gallops.  Respiratory: Normal respiratory effort  without tachypnea nor retractions. Breath sounds are clear and equal bilaterally. No wheezes/rales/rhonchi. Gastrointestinal: Soft and non tender. No rebound. No guarding.  Genitourinary: Deferred Musculoskeletal: Normal range of motion in all extremities. No lower extremity edema. Neurologic:  Normal speech and language. No gross focal neurologic deficits are appreciated.  Skin:  Skin is warm, dry and intact. No rash noted. Psychiatric: Mood and affect are normal. Speech and behavior are normal. Patient exhibits appropriate insight and judgment.  ____________________________________________    LABS (pertinent positives/negatives)  UA clear, moderate leukocytes, wbc 21-50, many bacteria, squamous 0-5 Lactic 1.4 CMP na 137, k 4.4, glu 121, cr 0.76 CBC wbc 4.8, hgb 11.0, plt 175 ____________________________________________   EKG  None  ____________________________________________    RADIOLOGY  None  ____________________________________________   PROCEDURES  Procedures  ____________________________________________   INITIAL IMPRESSION / ASSESSMENT AND PLAN / ED COURSE  Pertinent labs & imaging results that were available during my care of the patient were reviewed by me and considered in my medical decision making (see chart for details).   Patient presented to the emergency department today because of concerns for fatigue and fever.  Patient did have a brief headache.  This point concern for infection given the patient is on immunosuppressive drugs.  Concern would be for urinary tract infection, pneumonia, viral infection.  Work-up is consistent with urinary tract infection.  Given that the patient is on immunosuppressive medication for her lupus will plan on IV antibiotics and admission.  Discussed findings and plan with patient.  Will discuss with hospitalist for admission.   ____________________________________________   FINAL CLINICAL IMPRESSION(S) / ED  DIAGNOSES  Final diagnoses:  Lower urinary tract infectious disease     Note: This dictation was prepared with Dragon dictation. Any transcriptional errors that result from this process are unintentional     Nance Pear, MD 02/06/18 [phone]

## 2018-02-07 DIAGNOSIS — I10 Essential (primary) hypertension: Secondary | ICD-10-CM | POA: Diagnosis not present

## 2018-02-07 DIAGNOSIS — L932 Other local lupus erythematosus: Secondary | ICD-10-CM | POA: Diagnosis not present

## 2018-02-07 DIAGNOSIS — N390 Urinary tract infection, site not specified: Secondary | ICD-10-CM | POA: Diagnosis not present

## 2018-02-07 DIAGNOSIS — A419 Sepsis, unspecified organism: Secondary | ICD-10-CM | POA: Diagnosis not present

## 2018-02-07 LAB — CBC
HEMATOCRIT: 26.6 % — AB (ref 35.0–47.0)
Hemoglobin: 9.4 g/dL — ABNORMAL LOW (ref 12.0–16.0)
MCH: 35.1 pg — ABNORMAL HIGH (ref 26.0–34.0)
MCHC: 35.4 g/dL (ref 32.0–36.0)
MCV: 99.3 fL (ref 80.0–100.0)
Platelets: 153 10*3/uL (ref 150–440)
RBC: 2.68 MIL/uL — ABNORMAL LOW (ref 3.80–5.20)
RDW: 16.2 % — AB (ref 11.5–14.5)
WBC: 3.4 10*3/uL — ABNORMAL LOW (ref 3.6–11.0)

## 2018-02-07 NOTE — Care Management Note (Deleted)
Case Management Note  Patient Details  Name: Kim Santiago MRN: [id number] Date of Birth: 05/11/1935  Subjective/Objective:    Patient admitted to [facility] with UTI under observation status.              Action/Plan:   Expected Discharge Date:                  Expected Discharge Plan:     In-House Referral:     Discharge planning Services     Post Acute Care Choice:    Choice offered to:     DME Arranged:    DME Agency:     HH Arranged:    HH Agency:     Status of Service:     If discussed at [employer] of Stay Meetings, dates discussed:    Additional Comments:  Gracin Mcpartland A, RN 02/07/2018, 1:30 PM

## 2018-02-07 NOTE — Progress Notes (Signed)
[place] [facility] [place] NAME: Kim Santiago    MR#:  [id number]  DATE OF BIRTH:  [birth year]  SUBJECTIVE:  CHIEF COMPLAINT:  Pt with no nausea and vomiting, no fever, ch LBP Very tired  REVIEW OF SYSTEMS:  CONSTITUTIONAL: No fever, fatigue or weakness.  EYES: No blurred or double vision.  EARS, NOSE, AND THROAT: No tinnitus or ear pain.  RESPIRATORY: No cough, shortness of breath, wheezing or hemoptysis.  CARDIOVASCULAR: No chest pain, orthopnea, edema.  GASTROINTESTINAL: No nausea, vomiting, diarrhea or abdominal pain.  GENITOURINARY: No dysuria, hematuria.  ENDOCRINE: No polyuria, nocturia,  HEMATOLOGY: No anemia, easy bruising or bleeding SKIN: No rash or lesion. MUSCULOSKELETAL: No joint pain or arthritis.  ch LBP NEUROLOGIC: No tingling, numbness, weakness.  PSYCHIATRY: No anxiety or depression.   DRUG ALLERGIES:   Allergies  Allergen Reactions  . Amoxicillin Rash    Has patient had a PCN reaction causing immediate rash, facial/tongue/throat swelling, SOB or lightheadedness with hypotension: Unknown Has patient had a PCN reaction causing severe rash involving mucus membranes or skin necrosis: Unknown Has patient had a PCN reaction that required hospitalization: Unknown Has patient had a PCN reaction occurring within the last 10 years: Unknown If all of the above answers are "NO", then may proceed with Cephalosporin use.   . Codeine Nausea And Vomiting  . Naprosyn [Naproxen] Swelling  . Orudis [Ketoprofen] Hives  . Plaquenil [Hydroxychloroquine] Hives  . Sulfathiazole Rash    VITALS:  Blood pressure (!) 104/57, pulse 87, temperature 98.1 F (36.7 C), temperature source Oral, resp. rate 16, height 5' 4" (1.626 m), weight 62.9 kg (138 lb 9.6 oz), SpO2 100 %.  PHYSICAL EXAMINATION:  GENERAL:  83 y.o.-year-old patient lying in the bed with no acute distress.  EYES: Pupils equal, round, reactive to light and  accommodation. No scleral icterus. Extraocular muscles intact.  HEENT: Head atraumatic, normocephalic. Oropharynx and nasopharynx clear.  NECK:  Supple, no jugular venous distention. No thyroid enlargement, no tenderness.  LUNGS: Normal breath sounds bilaterally, no wheezing, rales,rhonchi or crepitation. No use of accessory muscles of respiration.  CARDIOVASCULAR: S1, S2 normal. No murmurs, rubs, or gallops.  ABDOMEN: Soft, nontender, nondistended. Bowel sounds present. No organomegaly or mass.  EXTREMITIES: No pedal edema, cyanosis, or clubbing.  NEUROLOGIC: Cranial nerves II through XII are intact. Muscle strength 5/5 in all extremities. Sensation intact. Gait not checked.  PSYCHIATRIC: The patient is alert and oriented x 3.  SKIN: No obvious rash, lesion, or ulcer.    LABORATORY PANEL:   CBC Recent Labs  Lab 02/07/18 0514  WBC 3.4*  HGB 9.4*  HCT 26.6*  PLT 153   ------------------------------------------------------------------------------------------------------------------  Chemistries  Recent Labs  Lab 02/06/18 1734  NA 137  K 4.4  CL 101  CO2 28  GLUCOSE 121*  BUN 14  CREATININE 0.76  CALCIUM 8.7*  AST 36  ALT 9*  ALKPHOS 79  BILITOT 0.4   ------------------------------------------------------------------------------------------------------------------  Cardiac Enzymes No results for input(s): TROPONINI in the last 168 hours. ------------------------------------------------------------------------------------------------------------------  RADIOLOGY:  No results found.  EKG:   Orders placed or performed during the hospital encounter of 08/25/16  . ED EKG within 10 minutes  . ED EKG within 10 minutes  . ED EKG  . ED EKG  . EKG    ASSESSMENT AND PLAN:    83 year old female with past medical history of breast cancer, hypothyroidism, Sjogren's syndrome, cutaneous lupus, hypertension, osteoporosis who presents to the hospital  due to chills, fever,  headache and malaise and noted to have a urinary tract infection.  1.  Sepsis-patient met criteria given her fever, tachycardia and abnormal urinalysis here in the ER. - We will treat the patient supportively with IV fluids, IV ceftriaxone for the UTI, follow up on blood and urine  cultures and fever curve and her hemodynamics.  2.  Urinary tract infection-we will treat patient with IV ceftriaxone, follow urine cultures.  3.  History of breast cancer-continue Femara, OP F/U WITH DR.CORCORANE  4.  Hypothyroidism-continue Synthroid.  5.  Essential hypertension-continue Toprol, losartan.  6.  History of cutaneous lupus-continue her azathioprine, OP f/u with derm and rheumatology  7.  Osteoporosis-continue calcium with vitamin D supplements.       All the records are reviewed and case discussed with Care Management/Social Workerr. Management plans discussed with the patient, family and they are in agreement.  CODE STATUS: dnr   TOTAL TIME TAKING CARE OF THIS PATIENT: 36 minutes.   POSSIBLE D/C IN 1-2 DAYS, DEPENDING ON CLINICAL CONDITION.  Note: This dictation was prepared with Dragon dictation along with smaller phrase technology. Any transcriptional errors that result from this process are unintentional.   Nicholes Mango M.D on 02/07/2018 at 9:49 AM  Between 7am to 6pm - Pager - [phone] After 6pm go to [email or web address] - password [email or web address] [facility]  Office  [phone]  CC: Primary care physician; [name], MD

## 2018-02-07 NOTE — Progress Notes (Signed)
Family Meeting Note  Advance Directive:yes  Today a meeting took place with the Patient.    The following clinical team members were present during this meeting:MD  The following were discussed:Patient's diagnosis: Sepsis with UTI, history of breast cancer, other comorbidities as documented below, treatment plan of care discussed in detail with the patient.  She verbalized understanding of the plan   . Arthritis   . Breast cancer ([place]) 2017   left mastectomy done 11/2015  . Breast cancer in female [facility]) 11/18/2015   Left: 3.9 cm tumor, T2, 1/2 sentinel nodes positive for macro metastatic disease, N1, 3 negative nodes in the axillary tail, ER+,PR+, Her 2 neu, low Mammoprint score  . Cancer [facility])    thyroid takes levothyroxine  . Chronic kidney disease    UTI  . Genetic screening March 2017.   Mammoprint of left breast cancer: Low risk for recurrence.   . Hypertension   . hypothyroidism    secondary to thyroidectomy for thyroid ca  . Hypothyroidism   . Lupus (HCC)    subcutaneous  . Menopause 40s   natural, hot flashes and mood lability now gone, off prempro 7 months  . Myocardial infarction ([place]) 2013  . Osteoporosis    Osteopenia  . Rosacea   . Sjoegren syndrome   . Stress-induced cardiomyopathy          Patient's progosis: Unable to determine and Goals for treatment: Partial code, agreeable with resuscitation, IV medications and shock but refusing intubation.  Daughter [name] is the healthcare POA  Additional follow-up to be provided: Hospitalist  Time spent during discussion:19 min  Nicholes Mango, MD

## 2018-02-07 NOTE — Care Management Obs Status (Signed)
[place] NOTIFICATION   Patient Details  Name: Kim GROSECLOSE MRN: [id number] Date of Birth: [birth year]   Medicare Observation Status Notification Given:  Yes    Ruhani Umland A, RN 02/07/2018, 1:29 PM

## 2018-02-07 NOTE — Consult Note (Addendum)
   [facility] CM Inpatient Consult   02/07/2018  Kim Santiago Jul 31, 1935 [id number]   Referral received from inpatient [facility] for [place] Management services.  Explained [facility] Care Management services to Kim Santiago. She is agreeable and verbal consent obtained.  Kim Santiago reports she lives alone with her daughter's support.  Confirms Primary Care Provider is Dr. Derrel Nip ( practice listed as doing transition of care calls).  Denies concerns with transportation. Her daughter takes her to MD appointments.  Confirms concerns with her lupus medication. States she is not sure what the copay will be and if she can continue to afford it ongoing. Agreeable to [facility] Pharmacist referral.  Discussed EMMI transition calls. States she prefers to speak with at case manager rather than an automated call.  Will make referral for [facility] Pharmacist and [place] Coordinator.  Kim Santiago is admitted with UTI/sepsis. She has a history of breast cancer, hypothyroidism, Sjogren's syndrome, cutaneous lupus, hypertension, osteoporosis.     Marthenia Rolling, MSN-Ed, RN,BSN [facility] Liaison [phone]

## 2018-02-07 NOTE — Care Management Note (Addendum)
Case Management Note  Patient Details  Name: Kim Santiago MRN: [id number] Date of Birth: [birth year]  Subjective/Objective:    Patient admitted to [facility] under observation for UTI. She lives alone but her daughter [name] [phone] is available if she needs help. She is able to complete all of her activities of daily living without issue. Uses no DME. [facility] but sometimes will fill her scrpits at [facility] in [place]. Pt follows with Dr Derrel Nip as her PCP. Pt has history of lupus. She reports no issues except for having to change medications frequently. Pt possibly qualifies for [facility]  Program since she has [employer] insurance.     Action/Plan: Referral and brochure given to pt for [facility]. RNCM will continue to follow for any possible resources.  Expected Discharge Date:                  Expected Discharge Plan:     In-House Referral:     Discharge planning Services     Post Acute Care Choice:    Choice offered to:     DME Arranged:    DME Agency:     HH Arranged:    HH Agency:     Status of Service:     If discussed at [employer] of [employer], dates discussed:    Additional Comments:  Caitlin Hillmer A, RN 02/07/2018, 1:34 PM

## 2018-02-08 DIAGNOSIS — L932 Other local lupus erythematosus: Secondary | ICD-10-CM | POA: Diagnosis not present

## 2018-02-08 DIAGNOSIS — N390 Urinary tract infection, site not specified: Secondary | ICD-10-CM | POA: Diagnosis not present

## 2018-02-08 DIAGNOSIS — A419 Sepsis, unspecified organism: Secondary | ICD-10-CM | POA: Diagnosis not present

## 2018-02-08 DIAGNOSIS — I10 Essential (primary) hypertension: Secondary | ICD-10-CM | POA: Diagnosis not present

## 2018-02-08 MED ORDER — CEPHALEXIN 500 MG PO CAPS: 500.0000 mg | ORAL_CAPSULE | Freq: Two times a day (BID) | ORAL | 0 refills | Status: DC

## 2018-02-08 MED ORDER — DIPHENHYDRAMINE HCL 25 MG PO CAPS: 25.0000 mg | ORAL_CAPSULE | Freq: Every evening | ORAL | 0 refills | Status: DC | PRN

## 2018-02-08 NOTE — Discharge Instructions (Signed)
F/U Primary care physician in a week Follow-up with Dr. Mike Gip oncology as recommended

## 2018-02-08 NOTE — Discharge Summary (Signed)
[facility] NAME: Kim Santiago    MR#:  [id number]  DATE OF BIRTH:  07-11-1935  DATE OF ADMISSION:  02/06/2018 ADMITTING PHYSICIAN: Henreitta Leber, MD  DATE OF DISCHARGE: 02/08/18   PRIMARY CARE PHYSICIAN: [name], MD    ADMISSION DIAGNOSIS:  Lower urinary tract infectious disease [N39.0]  DISCHARGE DIAGNOSIS:  Active Problems:   UTI (urinary tract infection)   SECONDARY DIAGNOSIS:   Past Medical History:  Diagnosis Date  . Arthritis   . Breast cancer ([facility]) 2017   left mastectomy done 11/2015  . Breast cancer in female [facility]) 11/18/2015   Left: 3.9 cm tumor, T2, 1/2 sentinel nodes positive for macro metastatic disease, N1, 3 negative nodes in the axillary tail, ER+,PR+, Her 2 neu, low Mammoprint score  . [facility])    thyroid takes levothyroxine  . Chronic kidney disease    UTI  . Genetic screening March 2017.   Mammoprint of left breast cancer: Low risk for recurrence.   . Hypertension   . hypothyroidism    secondary to thyroidectomy for thyroid ca  . Hypothyroidism   . Lupus ([facility])    subcutaneous  . Menopause 40s   natural, hot flashes and mood lability now gone, off prempro 7 months  . Myocardial infarction ([place]) 2013  . Osteoporosis    Osteopenia  . Rosacea   . Sjoegren syndrome   . Stress-induced cardiomyopathy September of 2013   EF 35%. Peak troponin was 1.8.    HOSPITAL COURSE:   HPI Kim Santiago  is a 83 y.o. female with a known history of breast cancer status post mastectomy, hypothyroidism, cutaneous lupus, history of Sjogren's syndrome, osteoporosis, stress-induced cardiomyopathy who presents to the hospital due to fever, chills and generalized malaise.  Patient says she developed some chills earlier yesterday and today measured her temperature which was initially 100.4 and then went up to as high as 102.  She was a bit concerned and therefore came to the ER for further evaluation.   Patient has been recently started on azathioprine for her cutaneous lupus by her dermatologist.  She thought initially that the drug was causing her symptoms and called her dermatologist who recommended she come to the ER for further evaluation.  Patient denies any cough, congestion, upper respiratory symptoms, she also denies any dysuria, urinary frequency.  In the emergency room patient underwent a urinalysis which was consistent with a UTI.  Hospitalist services were contacted for treatment evaluation.  Patient continued to have a low-grade fever in the ER and was tachycardic and therefore met criteria for sepsis and therefore hospitalist services were contacted for admission.  1.  Sepsis-patient met criteria given her fever, tachycardia and abnormal urinalysis here in the ER. - Clinically improved with IV ceftriaxone and IV fluids.  Patient is feeling fine afebrile, urine culture has revealed greater than 100,000 colonies of gram-negative rods final sensitivities pending.  PCP to follow-up on that and discharge patient with p.o. Keflex patient is hemodynamically stable Patient is allergic to penicillin but tolerating IV ceftriaxone during the hospital course   2.  Urinary tract infection-Clinically improved with IV ceftriaxone and plan of care as dictated above  3.  History of breast cancer-continue Femara.  Outpatient follow-up with Dr. Mike Gip  4.  Hypothyroidism-continue Synthroid.  5.  Essential hypertension-continue Toprol, holding losartan  In view of hypotension PCP to monitor blood pressure and can consider resuming losartan if blood pressure is  stable enough.  6.  History of cutaneous lupus-continue her azathioprine.  7.  Osteoporosis-continue calcium with vitamin D supplements.    DISCHARGE CONDITIONS:   STABLE  CONSULTS OBTAINED:     PROCEDURES NONE   DRUG ALLERGIES:   Allergies  Allergen Reactions  . Amoxicillin Rash    Has patient had a PCN reaction causing  immediate rash, facial/tongue/throat swelling, SOB or lightheadedness with hypotension: Unknown Has patient had a PCN reaction causing severe rash involving mucus membranes or skin necrosis: Unknown Has patient had a PCN reaction that required hospitalization: Unknown Has patient had a PCN reaction occurring within the last 10 years: Unknown If all of the above answers are "NO", then may proceed with Cephalosporin use.   . Codeine Nausea And Vomiting  . Naprosyn [Naproxen] Swelling  . Orudis [Ketoprofen] Hives  . Plaquenil [Hydroxychloroquine] Hives  . Sulfathiazole Rash    DISCHARGE MEDICATIONS:   Allergies as of 02/08/2018      Reactions   Amoxicillin Rash   Has patient had a PCN reaction causing immediate rash, facial/tongue/throat swelling, SOB or lightheadedness with hypotension: Unknown Has patient had a PCN reaction causing severe rash involving mucus membranes or skin necrosis: Unknown Has patient had a PCN reaction that required hospitalization: Unknown Has patient had a PCN reaction occurring within the last 10 years: Unknown If all of the above answers are "NO", then may proceed with Cephalosporin use.   Codeine Nausea And Vomiting   Naprosyn [naproxen] Swelling   Orudis [ketoprofen] Hives   Plaquenil [hydroxychloroquine] Hives   Sulfathiazole Rash      Medication List    STOP taking these medications   losartan 25 MG tablet Commonly known as:  COZAAR     TAKE these medications   aspirin 81 MG tablet Take 81 mg by mouth daily.   AZASAN 75 MG Tabs Generic drug:  Azathioprine Take 1 tablet by mouth daily.   Calcium Carbonate-Vitamin D 600-200 MG-UNIT Tabs Take 2 tablets by mouth 2 (two) times daily.   cephALEXin 500 MG capsule Commonly known as:  KEFLEX Take 1 capsule (500 mg total) by mouth 2 (two) times daily for 10 doses.   cholecalciferol 1000 units tablet Commonly known as:  VITAMIN D Take 2,000 Units by mouth daily.   diphenhydrAMINE 25 mg  capsule Commonly known as:  BENADRYL Take 1 capsule (25 mg total) by mouth at bedtime as needed.   diphenhydramine-acetaminophen 25-500 MG Tabs tablet Commonly known as:  TYLENOL PM Take 1 tablet by mouth at bedtime.   letrozole 2.5 MG tablet Commonly known as:  FEMARA Take 1 tablet by mouth once daily   levothyroxine 88 MCG tablet Commonly known as:  SYNTHROID, LEVOTHROID Take 1 tablet by mouth daily   metoprolol succinate 25 MG 24 hr tablet Commonly known as:  TOPROL-XL Take 1 tablet by mouth daily   mirtazapine 7.5 MG tablet Commonly known as:  REMERON Take 1 tablet by mouth at bedtime (Dose change)   polyethylene glycol packet Commonly known as:  MIRALAX / GLYCOLAX Take 17 g by mouth daily.   traMADol 50 MG tablet Commonly known as:  ULTRAM Take 1 tablet (50 mg total) by mouth every 8 (eight) hours as needed.        DISCHARGE INSTRUCTIONS:   F/U Primary care physician in a week Follow-up with Dr. Mike Gip oncology as recommended   DIET:  Low-salt  DISCHARGE CONDITION:  Stable  ACTIVITY:  Activity as tolerated  OXYGEN:  Home Oxygen: No.  Oxygen Delivery: room air  DISCHARGE LOCATION:  home   If you experience worsening of your admission symptoms, develop shortness of breath, life threatening emergency, suicidal or homicidal thoughts you must seek medical attention immediately by calling 911 or calling your MD immediately  if symptoms less severe.  You Must read complete instructions/literature along with all the possible adverse reactions/side effects for all the Medicines you take and that have been prescribed to you. Take any new Medicines after you have completely understood and accpet all the possible adverse reactions/side effects.   Please note  You were cared for by a hospitalist during your hospital stay. If you have any questions about your discharge medications or the care you received while you were in the hospital after you are  discharged, you can call the unit and asked to speak with the hospitalist on call if the hospitalist that took care of you is not available. Once you are discharged, your primary care physician will handle any further medical issues. Please note that NO REFILLS for any discharge medications will be authorized once you are discharged, as it is imperative that you return to your primary care physician (or establish a relationship with a primary care physician if you do not have one) for your aftercare needs so that they can reassess your need for medications and monitor your lab values.     Today  Chief Complaint  Patient presents with  . Fever  . Fatigue        Patient is resting comfortably with no complaints.  No nausea no vomiting no abdominal pain or back pain.  Wants to go home  Review of systems CONSTITUTIONAL: Denies fevers, chills. Denies any fatigue, weakness.  EYES: Denies blurry vision, double vision, eye pain. EARS, NOSE, THROAT: Denies tinnitus, ear pain, hearing loss. RESPIRATORY: Denies cough, wheeze, shortness of breath.  CARDIOVASCULAR: Denies chest pain, palpitations, edema.  GASTROINTESTINAL: Denies nausea, vomiting, diarrhea, abdominal pain. Denies bright red blood per rectum. GENITOURINARY: Denies dysuria, hematuria. ENDOCRINE: Denies nocturia or thyroid problems. HEMATOLOGIC AND LYMPHATIC: Denies easy bruising or bleeding. SKIN: Denies rash or lesion. MUSCULOSKELETAL: Denies pain in neck, back, shoulder, knees, hips or arthritic symptoms.  NEUROLOGIC: Denies paralysis, paresthesias.  PSYCHIATRIC: Denies anxiety or depressive symptoms.   VITAL SIGNS:  Blood pressure 119/61, pulse 95, temperature 98.8 F (37.1 C), temperature source Oral, resp. rate 18, height _0  (1.626 m), weight 62.9 kg (138 lb 9.6 oz), SpO2 100 %.  I/O:    Intake/Output Summary (Last 24 hours) at 02/08/2018 1410 Last data filed at 02/08/2018 1229 Gross per 24 hour  Intake 360 ml   Output 3250 ml  Net -2890 ml    PHYSICAL EXAMINATION:  GENERAL:  83 y.o.-year-old patient lying in the bed with no acute distress.  EYES: Pupils equal, round, reactive to light and accommodation. No scleral icterus. Extraocular muscles intact.  HEENT: Head atraumatic, normocephalic. Oropharynx and nasopharynx clear.  NECK:  Supple, no jugular venous distention. No thyroid enlargement, no tenderness.  LUNGS: Normal breath sounds bilaterally, no wheezing, rales,rhonchi or crepitation. No use of accessory muscles of respiration.  CARDIOVASCULAR: S1, S2 normal. No murmurs, rubs, or gallops.  ABDOMEN: Soft, non-tender, non-distended. Bowel sounds present. No organomegaly or mass.  EXTREMITIES: No pedal edema, cyanosis, or clubbing.  NEUROLOGIC: Cranial nerves II through XII are intact. Muscle strength 5/5 in all extremities. Sensation intact. Gait not checked.  PSYCHIATRIC: The patient is alert and oriented x 3.  SKIN: No obvious rash, lesion,  or ulcer.   DATA REVIEW:   CBC Recent Labs  Lab 02/07/18 0514  WBC 3.4*  HGB 9.4*  HCT 26.6*  PLT 153    Chemistries  Recent Labs  Lab 02/06/18 1734  NA 137  K 4.4  CL 101  CO2 28  GLUCOSE 121*  BUN 14  CREATININE 0.76  CALCIUM 8.7*  AST 36  ALT 9*  ALKPHOS 79  BILITOT 0.4    Cardiac Enzymes No results for input(s): TROPONINI in the last 168 hours.  Microbiology Results  Results for orders placed or performed during the hospital encounter of 02/06/18  Urine Culture     Status: Abnormal (Preliminary result)   Collection Time: 02/06/18  5:34 PM  Result Value Ref Range Status   Specimen Description   Final    URINE, RANDOM Performed at [facility], [address], [place], [place] [address]    Special Requests   Final    NONE Performed at [facility], [address], [place], [place] [address]    Culture >=100,000 COLONIES/mL KLEBSIELLA PNEUMONIAE (A)  Final   Report Status PENDING  Incomplete   Blood culture (routine x 2)     Status: None (Preliminary result)   Collection Time: 02/06/18  7:46 PM  Result Value Ref Range Status   Specimen Description BLOOD RIGHT ANTECUBITAL  Final   Special Requests   Final    BOTTLES DRAWN AEROBIC AND ANAEROBIC Blood Culture adequate volume   Culture   Final    NO GROWTH 2 DAYS Performed at [facility], [address], [place], [place] [address]    Report Status PENDING  Incomplete  Blood culture (routine x 2)     Status: None (Preliminary result)   Collection Time: 02/06/18  7:46 PM  Result Value Ref Range Status   Specimen Description BLOOD RIGHT ANTECUBITAL  Final   Special Requests   Final    BOTTLES DRAWN AEROBIC AND ANAEROBIC Blood Culture adequate volume   Culture   Final    NO GROWTH 2 DAYS Performed at [facility], [address], [place], [place] [address]    Report Status PENDING  Incomplete    RADIOLOGY:  No results found.  EKG:   Orders placed or performed during the hospital encounter of 08/25/16  . ED EKG within 10 minutes  . ED EKG within 10 minutes  . ED EKG  . ED EKG  . EKG      Management plans discussed with the patient, family and they are in agreement.  CODE STATUS:     Code Status Orders  (From admission, onward)        Start     Ordered   02/07/18 1023  Limited resuscitation (code)  Continuous    Question Answer Comment  In the event of cardiac or respiratory ARREST: Initiate Code Blue, Call Rapid Response Yes   In the event of cardiac or respiratory ARREST: Perform CPR Yes   In the event of cardiac or respiratory ARREST: Perform Intubation/Mechanical Ventilation No   In the event of cardiac or respiratory ARREST: Use NIPPV/BiPAp only if indicated Yes   In the event of cardiac or respiratory ARREST: Administer ACLS medications if indicated Yes   In the event of cardiac or respiratory ARREST: Perform Defibrillation or Cardioversion if indicated Yes      02/07/18 1023     Code Status History    Date Active Date Inactive Code Status Order ID Comments  User Context   02/06/2018 2141 02/07/2018 1022 Full Code [id number]  Henreitta Leber, MD Inpatient    Advance Directive Documentation     Most Recent Value  Type of Advance Directive  Healthcare Power of [place], Living will  Pre-existing out of facility DNR order (yellow form or pink MOST form)  -  "MOST" Form in Place?  -      TOTAL TIME TAKING CARE OF THIS PATIENT: 42  minutes.   Note: This dictation was prepared with Dragon dictation along with smaller phrase technology. Any transcriptional errors that result from this process are unintentional.   _0 @  on 02/08/2018 at 2:10 PM  Between 7am to 6pm - Pager - [phone]  After 6pm go to [email or web address] - password [email or web address] [facility]  Office  [phone]  CC: Primary care physician; [name], MD

## 2018-02-08 NOTE — Progress Notes (Signed)
Discharge teaching given to patient, patient verbalized understanding and had no questions. Patient IV removed. Patient will be transported home by family. All patient belongings gathered prior to leaving.  

## 2018-02-09 ENCOUNTER — Other Ambulatory Visit: Payer: Self-pay

## 2018-02-09 ENCOUNTER — Emergency Department: Payer: PPO

## 2018-02-09 ENCOUNTER — Inpatient Hospital Stay
Admission: EM | Admit: 2018-02-09 | Discharge: 2018-02-11 | DRG: 872 | Disposition: A | Discharge status: Home Health | Payer: PPO | Attending: Internal Medicine | Admitting: Internal Medicine

## 2018-02-09 ENCOUNTER — Encounter: Payer: Self-pay | Admitting: Emergency Medicine

## 2018-02-09 DIAGNOSIS — E890 Postprocedural hypothyroidism: Secondary | ICD-10-CM | POA: Diagnosis present

## 2018-02-09 DIAGNOSIS — E871 Hypo-osmolality and hyponatremia: Secondary | ICD-10-CM | POA: Diagnosis present

## 2018-02-09 DIAGNOSIS — A4159 Other Gram-negative sepsis: Principal | ICD-10-CM | POA: Diagnosis present

## 2018-02-09 DIAGNOSIS — Z886 Allergy status to analgesic agent status: Secondary | ICD-10-CM

## 2018-02-09 DIAGNOSIS — M1990 Unspecified osteoarthritis, unspecified site: Secondary | ICD-10-CM | POA: Diagnosis present

## 2018-02-09 DIAGNOSIS — M810 Age-related osteoporosis without current pathological fracture: Secondary | ICD-10-CM | POA: Diagnosis present

## 2018-02-09 DIAGNOSIS — I252 Old myocardial infarction: Secondary | ICD-10-CM | POA: Diagnosis not present

## 2018-02-09 DIAGNOSIS — R6889 Other general symptoms and signs: Secondary | ICD-10-CM | POA: Diagnosis present

## 2018-02-09 DIAGNOSIS — B961 Klebsiella pneumoniae [K. pneumoniae] as the cause of diseases classified elsewhere: Secondary | ICD-10-CM | POA: Diagnosis present

## 2018-02-09 DIAGNOSIS — Z981 Arthrodesis status: Secondary | ICD-10-CM

## 2018-02-09 DIAGNOSIS — L930 Discoid lupus erythematosus: Secondary | ICD-10-CM | POA: Diagnosis present

## 2018-02-09 DIAGNOSIS — I361 Nonrheumatic tricuspid (valve) insufficiency: Secondary | ICD-10-CM | POA: Diagnosis not present

## 2018-02-09 DIAGNOSIS — M3500 Sicca syndrome, unspecified: Secondary | ICD-10-CM | POA: Diagnosis present

## 2018-02-09 DIAGNOSIS — M40209 Unspecified kyphosis, site unspecified: Secondary | ICD-10-CM | POA: Diagnosis present

## 2018-02-09 DIAGNOSIS — Z85850 Personal history of malignant neoplasm of thyroid: Secondary | ICD-10-CM

## 2018-02-09 DIAGNOSIS — A419 Sepsis, unspecified organism: Secondary | ICD-10-CM | POA: Diagnosis present

## 2018-02-09 DIAGNOSIS — C779 Secondary and unspecified malignant neoplasm of lymph node, unspecified: Secondary | ICD-10-CM | POA: Diagnosis present

## 2018-02-09 DIAGNOSIS — M8580 Other specified disorders of bone density and structure, unspecified site: Secondary | ICD-10-CM | POA: Diagnosis present

## 2018-02-09 DIAGNOSIS — R531 Weakness: Secondary | ICD-10-CM | POA: Diagnosis present

## 2018-02-09 DIAGNOSIS — R519 Headache, unspecified: Secondary | ICD-10-CM

## 2018-02-09 DIAGNOSIS — Z882 Allergy status to sulfonamides status: Secondary | ICD-10-CM

## 2018-02-09 DIAGNOSIS — Z170 Estrogen receptor positive status [ER+]: Secondary | ICD-10-CM | POA: Diagnosis not present

## 2018-02-09 DIAGNOSIS — D899 Disorder involving the immune mechanism, unspecified: Secondary | ICD-10-CM | POA: Diagnosis present

## 2018-02-09 DIAGNOSIS — N390 Urinary tract infection, site not specified: Secondary | ICD-10-CM | POA: Diagnosis present

## 2018-02-09 DIAGNOSIS — C50912 Malignant neoplasm of unspecified site of left female breast: Secondary | ICD-10-CM | POA: Diagnosis present

## 2018-02-09 DIAGNOSIS — Z885 Allergy status to narcotic agent status: Secondary | ICD-10-CM

## 2018-02-09 DIAGNOSIS — Z880 Allergy status to penicillin: Secondary | ICD-10-CM

## 2018-02-09 DIAGNOSIS — Z79811 Long term (current) use of aromatase inhibitors: Secondary | ICD-10-CM

## 2018-02-09 DIAGNOSIS — Z79899 Other long term (current) drug therapy: Secondary | ICD-10-CM

## 2018-02-09 DIAGNOSIS — I129 Hypertensive chronic kidney disease with stage 1 through stage 4 chronic kidney disease, or unspecified chronic kidney disease: Secondary | ICD-10-CM | POA: Diagnosis present

## 2018-02-09 DIAGNOSIS — N189 Chronic kidney disease, unspecified: Secondary | ICD-10-CM | POA: Diagnosis present

## 2018-02-09 DIAGNOSIS — E878 Other disorders of electrolyte and fluid balance, not elsewhere classified: Secondary | ICD-10-CM | POA: Diagnosis present

## 2018-02-09 DIAGNOSIS — Z8679 Personal history of other diseases of the circulatory system: Secondary | ICD-10-CM

## 2018-02-09 DIAGNOSIS — R51 Headache: Secondary | ICD-10-CM

## 2018-02-09 DIAGNOSIS — Z9012 Acquired absence of left breast and nipple: Secondary | ICD-10-CM

## 2018-02-09 DIAGNOSIS — Z7982 Long term (current) use of aspirin: Secondary | ICD-10-CM

## 2018-02-09 LAB — COMPREHENSIVE METABOLIC PANEL
ALK PHOS: 181 U/L — AB (ref 38–126)
ALT: 34 U/L (ref 14–54)
ANION GAP: 8 (ref 5–15)
AST: 124 U/L — ABNORMAL HIGH (ref 15–41)
Albumin: 4 g/dL (ref 3.5–5.0)
BILIRUBIN TOTAL: 0.6 mg/dL (ref 0.3–1.2)
BUN: 16 mg/dL (ref 6–20)
CALCIUM: 8.6 mg/dL — AB (ref 8.9–10.3)
CO2: 26 mmol/L (ref 22–32)
Chloride: 100 mmol/L — ABNORMAL LOW (ref 101–111)
Creatinine, Ser: 0.72 mg/dL (ref 0.44–1.00)
GFR calc Af Amer: >60 mL/min (ref >60)
Glucose, Bld: 110 mg/dL — ABNORMAL HIGH (ref 65–99)
POTASSIUM: 4.6 mmol/L (ref 3.5–5.1)
Sodium: 134 mmol/L — ABNORMAL LOW (ref 135–145)
TOTAL PROTEIN: 7 g/dL (ref 6.5–8.1)

## 2018-02-09 LAB — CBC
HEMATOCRIT: 31.9 % — AB (ref 35.0–47.0)
HEMOGLOBIN: 11.3 g/dL — AB (ref 12.0–16.0)
MCH: 35.5 pg — ABNORMAL HIGH (ref 26.0–34.0)
MCHC: 35.3 g/dL (ref 32.0–36.0)
MCV: 100.4 fL — ABNORMAL HIGH (ref 80.0–100.0)
Platelets: 209 10*3/uL (ref 150–440)
RBC: 3.18 MIL/uL — ABNORMAL LOW (ref 3.80–5.20)
RDW: 16 % — AB (ref 11.5–14.5)
WBC: 6.2 10*3/uL (ref 3.6–11.0)

## 2018-02-09 LAB — URINE CULTURE: Culture: >=100000 — AB

## 2018-02-09 LAB — URINALYSIS, COMPLETE (UACMP) WITH MICROSCOPIC
BILIRUBIN URINE: NEGATIVE
Bacteria, UA: NONE SEEN
GLUCOSE, UA: NEGATIVE mg/dL
Hgb urine dipstick: NEGATIVE
KETONES UR: NEGATIVE mg/dL
LEUKOCYTES UA: NEGATIVE
NITRITE: NEGATIVE
PH: 5 (ref 5.0–8.0)
PROTEIN: NEGATIVE mg/dL
SQUAMOUS EPITHELIAL / LPF: NONE SEEN (ref 0–5)
Specific Gravity, Urine: 1.012 (ref 1.005–1.030)

## 2018-02-09 LAB — PROTIME-INR
INR: 1.07
Prothrombin Time: 13.8 seconds (ref 11.4–15.2)

## 2018-02-09 LAB — LACTIC ACID, PLASMA: LACTIC ACID, VENOUS: 1.7 mmol/L (ref 0.5–1.9)

## 2018-02-09 LAB — PROCALCITONIN: Procalcitonin: 4.31 ng/mL

## 2018-02-09 LAB — APTT: APTT: 38 s — AB (ref 24–36)

## 2018-02-09 LAB — LIPASE, BLOOD: Lipase: 55 U/L — ABNORMAL HIGH (ref 11–51)

## 2018-02-09 MED ORDER — LEVOTHYROXINE SODIUM 88 MCG PO TABS
88.0000 ug | ORAL_TABLET | Freq: Every day | ORAL | Status: DC
  Administered 2018-02-10 – 2018-02-11 (×2): 88 ug via ORAL
  Filled 2018-02-09 (×2): qty 1

## 2018-02-09 MED ORDER — HYDROCODONE-ACETAMINOPHEN 5-325 MG PO TABS
1.0000 | ORAL_TABLET | ORAL | Status: DC | PRN
  Administered 2018-02-09: 2 via ORAL
  Filled 2018-02-09 (×2): qty 2

## 2018-02-09 MED ORDER — POLYETHYLENE GLYCOL 3350 17 G PO PACK: 17.0000 g | PACK | Freq: Every day | ORAL | Status: DC | PRN

## 2018-02-09 MED ORDER — SODIUM CHLORIDE 0.9 % IV SOLN
Freq: Once | INTRAVENOUS | Status: AC
  Administered 2018-02-09: 15:00:00 via INTRAVENOUS

## 2018-02-09 MED ORDER — SODIUM CHLORIDE 0.9 % IV BOLUS (SEPSIS)
1000.0000 mL | Freq: Once | INTRAVENOUS | Status: AC
  Administered 2018-02-09: 1000 mL via INTRAVENOUS

## 2018-02-09 MED ORDER — POLYETHYLENE GLYCOL 3350 17 G PO PACK
17.0000 g | PACK | Freq: Every day | ORAL | Status: DC
  Administered 2018-02-09 – 2018-02-11 (×3): 17 g via ORAL
  Filled 2018-02-09 (×3): qty 1

## 2018-02-09 MED ORDER — ACETAMINOPHEN 325 MG PO TABS
650.0000 mg | ORAL_TABLET | Freq: Once | ORAL | Status: AC
  Administered 2018-02-09: 650 mg via ORAL

## 2018-02-09 MED ORDER — AZTREONAM 1 G IJ SOLR: 1.0000 g | Freq: Three times a day (TID) | INTRAMUSCULAR | Status: DC

## 2018-02-09 MED ORDER — ONDANSETRON HCL 4 MG/2ML IJ SOLN
4.0000 mg | Freq: Four times a day (QID) | INTRAMUSCULAR | Status: DC | PRN
  Administered 2018-02-09 – 2018-02-11 (×2): 4 mg via INTRAVENOUS
  Filled 2018-02-09: qty 2

## 2018-02-09 MED ORDER — TRAMADOL HCL 50 MG PO TABS
50.0000 mg | ORAL_TABLET | ORAL | Status: AC
  Administered 2018-02-09: 50 mg via ORAL
  Filled 2018-02-09 (×2): qty 1

## 2018-02-09 MED ORDER — CALCIUM CARBONATE-VITAMIN D 500-200 MG-UNIT PO TABS
2.0000 | ORAL_TABLET | Freq: Two times a day (BID) | ORAL | Status: DC
  Administered 2018-02-09 – 2018-02-11 (×4): 2 via ORAL
  Filled 2018-02-09 (×4): qty 2

## 2018-02-09 MED ORDER — ACETAMINOPHEN 325 MG PO TABS
ORAL_TABLET | ORAL | Status: AC
  Administered 2018-02-09: 650 mg via ORAL
  Filled 2018-02-09: qty 2

## 2018-02-09 MED ORDER — AZATHIOPRINE 50 MG PO TABS
75.0000 mg | ORAL_TABLET | Freq: Every day | ORAL | Status: DC
  Filled 2018-02-09 (×2): qty 2

## 2018-02-09 MED ORDER — VANCOMYCIN HCL IN DEXTROSE 1-5 GM/200ML-% IV SOLN
1000.0000 mg | Freq: Once | INTRAVENOUS | Status: AC
  Administered 2018-02-09: 1000 mg via INTRAVENOUS
  Filled 2018-02-09: qty 200

## 2018-02-09 MED ORDER — ACETAMINOPHEN 650 MG RE SUPP: 650.0000 mg | Freq: Four times a day (QID) | RECTAL | Status: DC | PRN

## 2018-02-09 MED ORDER — ONDANSETRON HCL 4 MG/2ML IJ SOLN
4.0000 mg | Freq: Once | INTRAMUSCULAR | Status: AC
  Administered 2018-02-09: 4 mg via INTRAVENOUS
  Filled 2018-02-09: qty 2

## 2018-02-09 MED ORDER — ENOXAPARIN SODIUM 40 MG/0.4ML ~~LOC~~ SOLN
40.0000 mg | SUBCUTANEOUS | Status: DC
  Administered 2018-02-09 – 2018-02-10 (×2): 40 mg via SUBCUTANEOUS
  Filled 2018-02-09 (×2): qty 0.4

## 2018-02-09 MED ORDER — ONDANSETRON HCL 4 MG/2ML IJ SOLN
INTRAMUSCULAR | Status: AC
  Administered 2018-02-09: 4 mg via INTRAVENOUS
  Filled 2018-02-09: qty 2

## 2018-02-09 MED ORDER — ASPIRIN EC 81 MG PO TBEC
81.0000 mg | DELAYED_RELEASE_TABLET | Freq: Every day | ORAL | Status: DC
  Administered 2018-02-10 – 2018-02-11 (×2): 81 mg via ORAL
  Filled 2018-02-09 (×2): qty 1

## 2018-02-09 MED ORDER — CEFAZOLIN SODIUM-DEXTROSE 2-4 GM/100ML-% IV SOLN
2.0000 g | Freq: Three times a day (TID) | INTRAVENOUS | Status: DC
  Administered 2018-02-09 – 2018-02-11 (×5): 2 g via INTRAVENOUS
  Filled 2018-02-09 (×9): qty 100

## 2018-02-09 MED ORDER — ONDANSETRON HCL 4 MG PO TABS: 4.0000 mg | ORAL_TABLET | Freq: Four times a day (QID) | ORAL | Status: DC | PRN

## 2018-02-09 MED ORDER — LETROZOLE 2.5 MG PO TABS
2.5000 mg | ORAL_TABLET | Freq: Every day | ORAL | Status: DC
  Administered 2018-02-09 – 2018-02-11 (×3): 2.5 mg via ORAL
  Filled 2018-02-09 (×3): qty 1

## 2018-02-09 MED ORDER — DOCUSATE SODIUM 100 MG PO CAPS
100.0000 mg | ORAL_CAPSULE | Freq: Two times a day (BID) | ORAL | Status: DC
  Administered 2018-02-09 – 2018-02-11 (×4): 100 mg via ORAL
  Filled 2018-02-09 (×4): qty 1

## 2018-02-09 MED ORDER — ACETAMINOPHEN 325 MG PO TABS
650.0000 mg | ORAL_TABLET | Freq: Four times a day (QID) | ORAL | Status: DC | PRN
  Administered 2018-02-10: 650 mg via ORAL
  Filled 2018-02-09: qty 2

## 2018-02-09 MED ORDER — PANTOPRAZOLE SODIUM 40 MG PO TBEC
40.0000 mg | DELAYED_RELEASE_TABLET | Freq: Every day | ORAL | Status: DC
  Administered 2018-02-09 – 2018-02-11 (×3): 40 mg via ORAL
  Filled 2018-02-09 (×3): qty 1

## 2018-02-09 MED ORDER — LEVOFLOXACIN IN D5W 750 MG/150ML IV SOLN
750.0000 mg | Freq: Once | INTRAVENOUS | Status: AC
  Administered 2018-02-09: 750 mg via INTRAVENOUS
  Filled 2018-02-09: qty 150

## 2018-02-09 MED ORDER — VITAMIN D 1000 UNITS PO TABS
2000.0000 [IU] | ORAL_TABLET | Freq: Every day | ORAL | Status: DC
Start: 2018-02-09 — End: 2018-02-11
  Administered 2018-02-10 – 2018-02-11 (×2): 2000 [IU] via ORAL
  Filled 2018-02-09 (×2): qty 2

## 2018-02-09 MED ORDER — SODIUM CHLORIDE 0.9 % IV SOLN
1.0000 g | Freq: Three times a day (TID) | INTRAVENOUS | Status: DC
  Administered 2018-02-09: 1 g via INTRAVENOUS
  Filled 2018-02-09 (×3): qty 1

## 2018-02-09 MED ORDER — DIPHENHYDRAMINE HCL 25 MG PO CAPS: 25.0000 mg | ORAL_CAPSULE | Freq: Every evening | ORAL | Status: DC | PRN

## 2018-02-09 NOTE — Progress Notes (Signed)
Family Meeting Note  Advance Directive:yes  Today a meeting took place with the Patient.  Patient is able to participate   The following clinical team members were present during this meeting:MD  The following were discussed:Patient's diagnosis: History of breast cancer, lupus, sepsis, UTI, hypertension, Patient's progosis: Unable to determine and Goals for treatment: Full Code  Additional follow-up to be provided: prn  Time spent during discussion:20 minutes  Gorden Harms, MD

## 2018-02-09 NOTE — ED Provider Notes (Signed)
Vitals:   02/09/18 1554 02/09/18 1600  BP: (!) 113/91 (!) 120/93  Pulse:  (!) 136  Resp: 16 (!) 25  Temp:    SpO2:  98%    Patient reports she is feeling somewhat improved.  Remains slightly tachycardic.  Labs reviewed.Discussed case and will re-admit to medical service, discussed with Dr. Jerelyn Charles.  I am concerned she could have bacteremia, given her Reiger's and presentation with persistent tachycardia and initial hypotension today.     Delman Kitten, MD 02/09/18 [phone]

## 2018-02-09 NOTE — Progress Notes (Signed)
CODE SEPSIS - PHARMACY COMMUNICATION  **Broad Spectrum Antibiotics should be administered within 1 hour of Sepsis diagnosis**  Time Code Sepsis Called/Page Received: 1433  Antibiotics Ordered: vancomycin, Levaquin, Azactam  Time of 1st antibiotic administration: 1547  Additional action taken by pharmacy: called RN  If necessary, Name of Provider/Nurse Contacted: Kathrynn Humble ,PharmD Clinical Pharmacist  02/09/2018  4:02 PM

## 2018-02-09 NOTE — H&P (Signed)
[facility] [place] NAME: Kim Santiago    MR#:  [id number]  DATE OF BIRTH:  September 26, 1934  DATE OF ADMISSION:  02/09/2018  PRIMARY CARE PHYSICIAN: [name], MD   REQUESTING/REFERRING PHYSICIAN:   CHIEF COMPLAINT:   Chief Complaint  Patient presents with  . Emesis  . Headache    HISTORY OF PRESENT ILLNESS: Kim Santiago  is a 83 y.o. female with a known history of breast cancer status post mastectomy, hypothyroidism, cutaneous lupus, history of Sjogren's syndrome, osteoporosis, stress-induced cardiomyopathy, discharged from hospital on yesterday for sepsis and acute Klebsiella UTI, patient sent home on Keflex, patient returns today with continued ill feeling, chills/Reiger's, generalized weakness, fatigue, ER work-up noted for tachycardia, tachypnea, hypotension with systolic blood pressure in the 70s, EKG noted for sinus tachycardia with heart rate of 126/anterior infarct, lipase 55, AST 124, lactic acid was normal, chest x-ray negative, UA negative, sodium 134, chloride 100, given IV vancomycin/Levaquin in the emergency room for presumed bacteremia,patient evaluated in the emergency room, daughter present, patient is now been admitted for acute sepsis most likely secondary to Klebsiella UTI, case compounded by chronic immunosuppression from known history of breast cancer and chronic lupus on chronic immunosuppression agents.    PAST MEDICAL HISTORY:   Past Medical History:  Diagnosis Date  . Arthritis   . Breast cancer ([place]) 2017   left mastectomy done 11/2015  . Breast cancer in female [facility]) 11/18/2015   Left: 3.9 cm tumor, T2, 1/2 sentinel nodes positive for macro metastatic disease, N1, 3 negative nodes in the axillary tail, ER+,PR+, Her 2 neu, low Mammoprint score  . Cancer [facility])    thyroid takes levothyroxine  . Chronic kidney disease    UTI  . Genetic screening March 2017.   Mammoprint of left breast cancer: Low risk for  recurrence.   . Hypertension   . hypothyroidism    secondary to thyroidectomy for thyroid ca  . Hypothyroidism   . Lupus ([facility])    subcutaneous  . Menopause 40s   natural, hot flashes and mood lability now gone, off prempro 7 months  . Myocardial infarction ([place]) 2013  . Osteoporosis    Osteopenia  . Rosacea   . Sjoegren syndrome   . Stress-induced cardiomyopathy September of 2013   EF 35%. Peak troponin was 1.8.    PAST SURGICAL HISTORY:  Past Surgical History:  Procedure Laterality Date  . BACK SURGERY    . BREAST BIOPSY Left 10/30/15   positive, done in Dr. Dwyane Luo office  . CARDIAC CATHETERIZATION  05/2012   [facility]. No significant CAD. Ejection fraction of 35% due to stress-induced cardiomyopathy.  . CHOLECYSTECTOMY    . COLONOSCOPY    . DILATION AND CURETTAGE OF UTERUS    . KYPHOSIS SURGERY  Feb 2008   L1, Dr. Mauri Pole  . LUMBAR DISC SURGERY     L4-L5  . MASTECTOMY Left 11/18/2015   positive  . SENTINEL NODE BIOPSY Left 11/18/2015   Procedure: SENTINEL NODE BIOPSY;  Surgeon: Robert Bellow, MD;  Location: [facility] ORS;  Service: General;  Laterality: Left;  . SHOULDER ARTHROSCOPY  2004   Left, Dr. Jefm Bryant  . SIMPLE MASTECTOMY WITH AXILLARY SENTINEL NODE BIOPSY Left 11/18/2015   Procedure: SIMPLE MASTECTOMY;  Surgeon: Robert Bellow, MD;  Location: [facility] ORS;  Service: General;  Laterality: Left;  . SPINE SURGERY     L4-5 diskectomy  . THYROIDECTOMY     Thyroid Cancer  .  TONSILLECTOMY    . TUBAL LIGATION      SOCIAL HISTORY:  Social History   Tobacco Use  . Smoking status: Never Smoker  . Smokeless tobacco: Never Used  Substance Use Topics  . Alcohol use: No    FAMILY HISTORY:  Family History  Problem Relation Age of Onset  . COPD Father   . Cancer Father        esophageal  . Kidney disease Mother   . Heart disease Mother   . Kidney disease Sister   . Cancer Brother 83       colon cancer (both brothers)  . Heart attack Brother 1  . Heart disease  Brother   . Breast cancer Neg Hx     DRUG ALLERGIES:  Allergies  Allergen Reactions  . Amoxicillin Rash    Has patient had a PCN reaction causing immediate rash, facial/tongue/throat swelling, SOB or lightheadedness with hypotension: Unknown Has patient had a PCN reaction causing severe rash involving mucus membranes or skin necrosis: Unknown Has patient had a PCN reaction that required hospitalization: Unknown Has patient had a PCN reaction occurring within the last 10 years: Unknown If all of the above answers are "NO", then may proceed with Cephalosporin use.   . Codeine Nausea And Vomiting  . Naprosyn [Naproxen] Swelling  . Orudis [Ketoprofen] Hives  . Plaquenil [Hydroxychloroquine] Hives  . Sulfathiazole Rash    REVIEW OF SYSTEMS:   CONSTITUTIONAL: + fever, fatigue, weakness, rigors.  EYES: No blurred or double vision.  EARS, NOSE, AND THROAT: No tinnitus or ear pain.  RESPIRATORY: No cough, shortness of breath, wheezing or hemoptysis.  CARDIOVASCULAR: No chest pain, orthopnea, edema.  GASTROINTESTINAL: + nausea, vomiting, no diarrhea, + abdominal pain.  GENITOURINARY: No dysuria, hematuria.  ENDOCRINE: No polyuria, nocturia,  HEMATOLOGY: No anemia, easy bruising or bleeding SKIN: No rash or lesion. MUSCULOSKELETAL: No joint pain or arthritis.   NEUROLOGIC: No tingling, numbness, weakness.  PSYCHIATRY: No anxiety or depression.   MEDICATIONS AT HOME:  Prior to Admission medications   Medication Sig Start Date End Date Taking? Authorizing Provider  aspirin 81 MG tablet Take 81 mg by mouth daily.   Yes [provider]  AZASAN 75 MG TABS Take 1 tablet by mouth daily. 01/11/18  Yes [provider]  Calcium Carbonate-Vitamin D (CALCIUM + D) 600-200 MG-UNIT TABS Take 2 tablets by mouth 2 (two) times daily.    Yes [provider]  cephALEXin (KEFLEX) 500 MG capsule Take 1 capsule (500 mg total) by mouth 2 (two) times daily for 10 doses. 02/08/18 02/13/18  Yes Gouru, Illene Silver, MD  cholecalciferol (VITAMIN D) 1000 UNITS tablet Take 2,000 Units by mouth daily.    Yes [provider]  diphenhydramine-acetaminophen (TYLENOL PM) 25-500 MG TABS tablet Take 1 tablet by mouth at bedtime.    Yes [provider]  letrozole [facility]) 2.5 MG tablet Take 1 tablet by mouth once daily 11/07/17  Yes Corcoran, Drue Second, MD  levothyroxine (SYNTHROID, LEVOTHROID) 88 MCG tablet Take 1 tablet by mouth daily 01/21/18  Yes [name], MD  metoprolol succinate (TOPROL-XL) 25 MG 24 hr tablet Take 1 tablet by mouth daily 11/08/17  Yes [name], MD  mirtazapine (REMERON) 7.5 MG tablet Take 1 tablet by mouth at bedtime (Dose change) 11/08/17  Yes [name], MD  omeprazole (PRILOSEC) 20 MG capsule Take 1 capsule by mouth 2 (two) times daily. 01/20/18  Yes [provider]  polyethylene glycol (MIRALAX /  GLYCOLAX) packet Take 17 g by mouth daily.   Yes [provider]  diphenhydrAMINE (BENADRYL) 25 mg capsule Take 1 capsule (25 mg total) by mouth at bedtime as needed. 02/08/18   Gouru, Illene Silver, MD  traMADol (ULTRAM) 50 MG tablet Take 1 tablet (50 mg total) by mouth every 8 (eight) hours as needed. 06/11/17   [name], MD      PHYSICAL EXAMINATION:   VITAL SIGNS: Blood pressure (!) 121/53, pulse (!) 131, temperature 98.9 F (37.2 C), temperature source Oral, resp. rate (!) 28, height 5\' 4"  (1.626 m), weight 61.7 kg (136 lb), SpO2 97 %.  GENERAL:  83 y.o.-year-old patient lying in the bed with no acute distress.  Frail-appearing, diaphoretic EYES: Pupils equal, round, reactive to light and accommodation. No scleral icterus. Extraocular muscles intact.  HEENT: Head atraumatic, normocephalic. Oropharynx and nasopharynx clear.  NECK:  Supple, no jugular venous distention. No thyroid enlargement, no tenderness.  LUNGS: Normal breath sounds bilaterally, no wheezing, rales,rhonchi or crepitation. No use of accessory muscles of respiration.   CARDIOVASCULAR: S1, S2 normal. No murmurs, rubs, or gallops.  ABDOMEN: Soft, nontender, nondistended. Bowel sounds present. No organomegaly or mass.  EXTREMITIES: No pedal edema, cyanosis, or clubbing.  NEUROLOGIC: Cranial nerves II through XII are intact. MAES. Gait not checked.  PSYCHIATRIC: The patient is alert and oriented x 3.  SKIN: No obvious rash, lesion, or ulcer. +pale  LABORATORY PANEL:   CBC Recent Labs  Lab 02/06/18 1734 02/07/18 0514 02/09/18 1453  WBC 4.8 3.4* 6.2  HGB 11.0* 9.4* 11.3*  HCT 30.8* 26.6* 31.9*  PLT 175 153 209  MCV 100.3* 99.3 100.4*  MCH 35.7* 35.1* 35.5*  MCHC 35.6 35.4 35.3  RDW 16.4* 16.2* 16.0*  LYMPHSABS 0.4*  --   --   MONOABS 0.4  --   --   EOSABS 0.1  --   --   BASOSABS 0.0  --   --    ------------------------------------------------------------------------------------------------------------------  Chemistries  Recent Labs  Lab 02/06/18 1734 02/09/18 1453  NA 137 134*  K 4.4 4.6  CL 101 100*  CO2 28 26  GLUCOSE 121* 110*  BUN 14 16  CREATININE 0.76 0.72  CALCIUM 8.7* 8.6*  AST 36 124*  ALT 9* 34  ALKPHOS 79 181*  BILITOT 0.4 0.6   ------------------------------------------------------------------------------------------------------------------ estimated creatinine clearance is 47.6 mL/min (by C-G formula based on SCr of 0.72 mg/dL). ------------------------------------------------------------------------------------------------------------------ No results for input(s): TSH, T4TOTAL, T3FREE, THYROIDAB in the last 72 hours.  Invalid input(s): FREET3   Coagulation profile No results for input(s): INR, PROTIME in the last 168 hours. ------------------------------------------------------------------------------------------------------------------- No results for input(s): DDIMER in the last 72  hours. -------------------------------------------------------------------------------------------------------------------  Cardiac Enzymes No results for input(s): CKMB, TROPONINI, MYOGLOBIN in the last 168 hours.  Invalid input(s): CK ------------------------------------------------------------------------------------------------------------------ Invalid input(s): POCBNP  ---------------------------------------------------------------------------------------------------------------  Urinalysis    Component Value Date/Time   COLORURINE YELLOW (A) 02/09/2018 1453   APPEARANCEUR CLEAR (A) 02/09/2018 1453   LABSPEC 1.012 02/09/2018 1453   PHURINE 5.0 02/09/2018 1453   GLUCOSEU NEGATIVE 02/09/2018 1453   GLUCOSEU NEGATIVE 05/11/2017 1143   HGBUR NEGATIVE 02/09/2018 1453   BILIRUBINUR NEGATIVE 02/09/2018 1453   BILIRUBINUR neg 04/30/2017 1107   KETONESUR NEGATIVE 02/09/2018 1453   PROTEINUR NEGATIVE 02/09/2018 1453   UROBILINOGEN 0.2 05/11/2017 1143   NITRITE NEGATIVE 02/09/2018 1453   LEUKOCYTESUR NEGATIVE 02/09/2018 1453     RADIOLOGY: Ct Head Wo Contrast  Result Date: 02/09/2018 CLINICAL DATA:  Mental status changes. EXAM:  CT HEAD WITHOUT CONTRAST TECHNIQUE: Contiguous axial images were obtained from the base of the skull through the vertex without intravenous contrast. COMPARISON:  04/03/2014 FINDINGS: Brain: There is no evidence for acute hemorrhage, hydrocephalus, mass lesion, or abnormal extra-axial fluid collection. No definite CT evidence for acute infarction. Diffuse loss of parenchymal volume is consistent with atrophy. Patchy low attenuation in the deep hemispheric and periventricular white matter is nonspecific, but likely reflects chronic microvascular ischemic demyelination. High left parietal chronic infarct stable in the interval. Vascular: No hyperdense vessel or unexpected calcification. Skull: No evidence for fracture. No worrisome lytic or sclerotic lesion.  Sinuses/Orbits: The visualized paranasal sinuses and mastoid air cells are clear. Visualized portions of the globes and intraorbital fat are unremarkable. Other: None. IMPRESSION: Stable.  No acute intracranial abnormality. Atrophy with chronic small vessel white matter ischemic disease. Electronically Signed   By: Misty Stanley M.D.   On: 02/09/2018 17:00   Dg Chest Port 1 View  Result Date: 02/09/2018 CLINICAL DATA:  Fevers and recent UTI EXAM: PORTABLE CHEST 1 VIEW COMPARISON:  09/23/2017 FINDINGS: Cardiac shadow is within normal limits. The lungs are well aerated bilaterally. Artifact is noted over right lung base. No acute bony abnormality is noted. IMPRESSION: No active disease. Electronically Signed   By: Inez Catalina M.D.   On: 02/09/2018 15:05    EKG: Orders placed or performed during the hospital encounter of 02/09/18  . ED EKG  . ED EKG  . EKG 12-Lead  . EKG 12-Lead    IMPRESSION AND PLAN: *AcuteSepsis Discharge from hospital on yesterday with similar clinical picture Is in today with fevers, chills, Reiger's, tachycardia, tachypnea, hypotension, elevated liver function tests, hyponatremia, hypochloremia, compounded by immunosuppression from history of breast cancer and chronic lupus on immunosuppressive agents Admit to regular nursing floor bed on our sepsis protocol, IV Ancef given sensitivities from last admission noted for Klebsiella pneumonia on urine culture, check echocardiogram to evaluate for possible endocarditis, IV fluids for rehydration, follow-up on repeat cultures done in the emergency room, consult infectious disease for expert opinion, continue close medical monitoring  *Acute Klebsiella UTI  plan of care as stated above  *Chronic cutaneous lupus  stable Continue azathioprine  *History of breast cancer Stable continue Synthroid  *Acute hypotension with history of hypertension Hold antihypertensives for now, vitals per routine, make changes per  necessary  *History of osteoporosis  Stable  continue calcium with vitamin D supplements.  All the records are reviewed and case discussed with ED provider. Management plans discussed with the patient, family and they are in agreement.  CODE STATUS:full Code Status History    Date Active Date Inactive Code Status Order ID Comments User Context   02/07/2018 1023 02/08/2018 2016 Partial Code [id number]  Nicholes Mango, MD Inpatient   02/06/2018 2141 02/07/2018 1022 Full Code [id number]  Henreitta Leber, MD Inpatient    Questions for Most Recent Historical Code Status (Order [id number])    Question Answer Comment   In the event of cardiac or respiratory ARREST: Initiate Code Blue, Call Rapid Response Yes    In the event of cardiac or respiratory ARREST: Perform CPR Yes    In the event of cardiac or respiratory ARREST: Perform Intubation/Mechanical Ventilation No    In the event of cardiac or respiratory ARREST: Use NIPPV/BiPAp only if indicated Yes    In the event of cardiac or respiratory ARREST: Administer ACLS medications if indicated Yes    In the event of cardiac or respiratory ARREST:  Perform Defibrillation or Cardioversion if indicated Yes        TOTAL TIME TAKING CARE OF THIS PATIENT: 45 minutes.    Avel Peace Samona Chihuahua M.D on 02/09/2018   Between 7am to 6pm - Pager - [phone]  After 6pm go to [email or web address] - password [email or web address] [place] [facility]  Office  [phone]  CC: Primary care physician; [name], MD   Note: This dictation was prepared with Dragon dictation along with smaller phrase technology. Any transcriptional errors that result from this process are unintentional.

## 2018-02-09 NOTE — ED Notes (Signed)
Pt placed on 2L nasal cannula to maintain saturation >90%.

## 2018-02-09 NOTE — Patient Outreach (Signed)
[facility]) Care Management  02/09/2018  Kim Santiago 1935-06-18 [id number]   Telephone Screen  Referral Date: 02/08/18 Referral Source: [facility] Liasion Referral Reason: " lupus, HTN, care coordination, PCP does TOC, [facility] pharmacist for medication costs and medication changes" Insurance: HTA   Outreach attempt # 1 to patient. No answer at present. RN CM left HIPAA compliant voicemail message along with contact info.       Plan: RN CM will make outreach attempt to patient within three business days. RN CM will send unsuccessful outreach letter to patient.    Enzo Montgomery, RN,BSN,CCM [place] [facility] Telephonic Care Management Coordinator Direct Phone: [phone] Toll Free: [phone] Fax: [phone]

## 2018-02-09 NOTE — ED Provider Notes (Signed)
EKG reviewed and entered by me at 1539 heart rate 125 Care is 99 QTC 430 Sinus tachycardia, right axis deviation.  No evidence of acute ST elevation.   Delman Kitten, MD 02/09/18 [phone]

## 2018-02-09 NOTE — ED Provider Notes (Addendum)
[facility] Emergency Department Provider Note       Time seen: ----------------------------------------- 2:41 PM on 02/09/2018 -----------------------------------------   I have reviewed the triage vital signs and the nursing notes.  HISTORY   Chief Complaint Emesis and Headache    HPI Kim Santiago is a 83 y.o. female with a history of chronic kidney disease, hypertension, hypothyroidism, lupus, breast cancer who presents to the ED for general ill feeling.  She has had chills and shivering.  Patient was seen and admitted into the hospital on Sunday and just discharged yesterday.  Patient states her head did not feel quite right upon discharge.  She was diagnosed with Klebsiella pneumonia in her urine and is currently taking oral antibiotics.  Nothing makes her symptoms better.  Past Medical History:  Diagnosis Date  . Arthritis   . [place] 2017   left mastectomy done 11/2015  . Breast cancer in female [facility]) 11/18/2015   Left: 3.9 cm tumor, T2, 1/2 sentinel nodes positive for macro metastatic disease, N1, 3 negative nodes in the axillary tail, ER+,PR+, Her 2 neu, low Mammoprint score  . Cancer [facility])    thyroid takes levothyroxine  . Chronic kidney disease    UTI  . Genetic screening March 2017.   Mammoprint of left breast cancer: Low risk for recurrence.   . Hypertension   . hypothyroidism    secondary to thyroidectomy for thyroid ca  . Hypothyroidism   . Lupus ([facility])    subcutaneous  . Menopause 40s   natural, hot flashes and mood lability now gone, off prempro 7 months  . Myocardial infarction ([place]) 2013  . Osteoporosis    Osteopenia  . Rosacea   . Sjoegren syndrome   . Stress-induced cardiomyopathy September of 2013   EF 35%. Peak troponin was 1.8.    Patient Active Problem List   Diagnosis Date Noted  . UTI (urinary tract infection) 02/06/2018  . GERD (gastroesophageal reflux disease) 10/31/2017  . Hand joint pain  06/13/2017  . Vaginal atrophy 05/11/2017  . Lymph node enlargement 03/17/2017  . Venous insufficiency of both lower extremities 01/10/2017  . Discoid lupus 06/26/2016  . TMJ syndrome 03/31/2016  . Vertigo 03/12/2016  . Hearing loss 03/12/2016  . Osteopenia 12/05/2015  . Malignant neoplasm of left female breast ([place]) 11/26/2015  . Breast cancer, female, left 11/05/2015  . Advanced directives, counseling/discussion 09/18/2015  . History of heart attack 06/12/2014  . Hx of thyroid cancer 06/12/2014  . Adenomatous polyp 06/12/2014  . Major depressive disorder, recurrent episode, moderate ([place]) 05/29/2014  . Grief 04/08/2014  . Sjogren's syndrome ([place]) 03/09/2014  . Insomnia due to stress 03/06/2014  . Cervicalgia of occipito-atlanto-axial region 09/22/2013  . Hyperlipidemia 12/28/2012  . Medicare annual wellness visit, subsequent 12/28/2012  . Iatrogenic hypothyroidism 10/02/2011    Past Surgical History:  Procedure Laterality Date  . BACK SURGERY    . BREAST BIOPSY Left 10/30/15   positive, done in Dr. Dwyane Luo office  . CARDIAC CATHETERIZATION  05/2012   [facility]. No significant CAD. Ejection fraction of 35% due to stress-induced cardiomyopathy.  . CHOLECYSTECTOMY    . COLONOSCOPY    . DILATION AND CURETTAGE OF UTERUS    . KYPHOSIS SURGERY  Feb 2008   L1, Dr. Mauri Pole  . LUMBAR DISC SURGERY     L4-L5  . MASTECTOMY Left 11/18/2015   positive  . SENTINEL NODE BIOPSY Left 11/18/2015   Procedure: SENTINEL NODE BIOPSY;  Surgeon: Robert Bellow,  MD;  Location: [facility] ORS;  Service: General;  Laterality: Left;  . SHOULDER ARTHROSCOPY  2004   Left, Dr. Jefm Bryant  . SIMPLE MASTECTOMY WITH AXILLARY SENTINEL NODE BIOPSY Left 11/18/2015   Procedure: SIMPLE MASTECTOMY;  Surgeon: Robert Bellow, MD;  Location: [facility] ORS;  Service: General;  Laterality: Left;  . SPINE SURGERY     L4-5 diskectomy  . THYROIDECTOMY     Thyroid Cancer  . TONSILLECTOMY    . TUBAL LIGATION       Allergies Amoxicillin; Codeine; Naprosyn [naproxen]; Orudis [ketoprofen]; Plaquenil [hydroxychloroquine]; and Sulfathiazole  Social History Social History   Tobacco Use  . Smoking status: Never Smoker  . Smokeless tobacco: Never Used  Substance Use Topics  . Alcohol use: No  . Drug use: No   Review of Systems Constitutional: Negative for fever. Cardiovascular: Negative for chest pain. Respiratory: Negative for shortness of breath. Gastrointestinal: Negative for abdominal pain, vomiting and diarrhea. Genitourinary: Negative for dysuria. Musculoskeletal: Negative for back pain. Skin: Negative for rash. Neurological: Positive for headache, weakness  All systems negative/normal/unremarkable except as stated in the HPI  ____________________________________________   PHYSICAL EXAM:  VITAL SIGNS: ED Triage Vitals  Enc Vitals Group     BP 02/09/18 1413 (!) 78/66     Pulse Rate 02/09/18 1413 (!) 125     Resp 02/09/18 1413 20     Temp 02/09/18 1413 98.9 F (37.2 C)     Temp Source 02/09/18 1413 Oral     SpO2 02/09/18 1413 (!) 87 %     Weight 02/09/18 1414 136 lb (61.7 kg)     Height 02/09/18 1414 5\' 4"  (1.626 m)     Head Circumference --      Peak Flow --      Pain Score 02/09/18 1414 9     Pain Loc --      Pain Edu? --      Excl. in [place]? --    Constitutional: Alert and oriented.  Mild distress Eyes: Conjunctivae are normal. Normal extraocular movements. ENT   Head: Normocephalic and atraumatic.   Nose: No congestion/rhinnorhea.   Mouth/Throat: Mucous membranes are moist.   Neck: No stridor. Cardiovascular: Rapid rate, regular rhythm. No murmurs, rubs, or gallops. Respiratory: Normal respiratory effort without tachypnea nor retractions. Breath sounds are clear and equal bilaterally. No wheezes/rales/rhonchi. Gastrointestinal: Soft and nontender. Normal bowel sounds Musculoskeletal: Nontender with normal range of motion in extremities. No lower  extremity tenderness nor edema. Neurologic:  Normal speech and language. No gross focal neurologic deficits are appreciated.  Skin:  Skin is warm, dry and intact. No rash noted. Psychiatric: Mood and affect are normal. Speech and behavior are normal.  ____________________________________________  ED COURSE:  As part of my medical decision making, I reviewed the following data within the [place] History obtained from family if available, nursing notes, old chart and ekg, as well as notes from prior ED visits. Patient presented for Rigor's and likely sepsis, we will assess with labs and imaging as indicated at this time.   Procedures ____________________________________________   LABS (pertinent positives/negatives)  Labs Reviewed  CBC - Abnormal; Notable for the following components:      Result Value   RBC 3.18 (*)    Hemoglobin 11.3 (*)    HCT 31.9 (*)    MCV 100.4 (*)    MCH 35.5 (*)    RDW 16.0 (*)    All other components within normal limits  BLOOD GAS, VENOUS -  Abnormal; Notable for the following components:   Bicarbonate 29.0 (*)    Acid-Base Excess 2.2 (*)    All other components within normal limits  CULTURE, BLOOD (ROUTINE X 2)  CULTURE, BLOOD (ROUTINE X 2)  URINE CULTURE  LIPASE, BLOOD  COMPREHENSIVE METABOLIC PANEL  URINALYSIS, COMPLETE (UACMP) WITH MICROSCOPIC  LACTIC ACID, PLASMA  LACTIC ACID, PLASMA  TROPONIN I   CRITICAL CARE Performed by: Laurence Aly   Total critical care time: 30 minutes  Critical care time was exclusive of separately billable procedures and treating other patients.  Critical care was necessary to treat or prevent imminent or life-threatening deterioration.  Critical care was time spent personally by me on the following activities: development of treatment plan with patient and/or surrogate as well as nursing, discussions with consultants, evaluation of patient's response to treatment, examination of  patient, obtaining history from patient or surrogate, ordering and performing treatments and interventions, ordering and review of laboratory studies, ordering and review of radiographic studies, pulse oximetry and re-evaluation of patient's condition.  RADIOLOGY Images were viewed by me  Chest x-ray Does not reveal any acute process ____________________________________________  DIFFERENTIAL DIAGNOSIS   Dehydration, electrolyte abnormality, sepsis, UTI, pneumonia  FINAL ASSESSMENT AND PLAN  Sepsis   Plan: The patient had presented for likely persistent sepsis after recent discharge and treatment for same. Patient's labs are negative at this time. Patient's imaging is also negative.  Clinically she appears to be septic.  Final disposition will likely be for admission.   Laurence Aly, MD   Note: This note was generated in part or whole with voice recognition software. Voice recognition is usually quite accurate but there are transcription errors that can and very often do occur. I apologize for any typographical errors that were not detected and corrected.     Earleen Newport, MD 02/09/18 1527    Earleen Newport, MD 02/19/18 [id number]

## 2018-02-10 ENCOUNTER — Other Ambulatory Visit: Payer: Self-pay

## 2018-02-10 ENCOUNTER — Inpatient Hospital Stay (HOSPITAL_COMMUNITY)
Admit: 2018-02-10 | Discharge: 2018-02-10 | Disposition: A | Discharge status: Home / Self Care | Payer: PPO | Attending: Family Medicine | Admitting: Family Medicine

## 2018-02-10 DIAGNOSIS — I361 Nonrheumatic tricuspid (valve) insufficiency: Secondary | ICD-10-CM

## 2018-02-10 LAB — BASIC METABOLIC PANEL
Anion gap: 5 (ref 5–15)
BUN: 14 mg/dL (ref 6–20)
CHLORIDE: 105 mmol/L (ref 101–111)
CO2: 22 mmol/L (ref 22–32)
Calcium: 7.2 mg/dL — ABNORMAL LOW (ref 8.9–10.3)
Creatinine, Ser: 0.65 mg/dL (ref 0.44–1.00)
GFR calc Af Amer: >60 mL/min (ref >60)
GLUCOSE: 110 mg/dL — AB (ref 65–99)
POTASSIUM: 4.5 mmol/L (ref 3.5–5.1)
Sodium: 132 mmol/L — ABNORMAL LOW (ref 135–145)

## 2018-02-10 LAB — TROPONIN I

## 2018-02-10 LAB — ECHOCARDIOGRAM COMPLETE
Height: 64 in
Weight: 2176 oz

## 2018-02-10 NOTE — Consult Note (Signed)
[facility] Infectious Disease     Reason for Consult:sepsis, UTI   Referring Physician: Clovis Fredrickson Date of Admission:  02/09/2018   Active Problems:   Sepsis ([place])   HPI: Kim Santiago is a 82 y.o. female with recent admission for sepsis and UTI recently dced on keflex for Kleb PNA UTI, now with recurrent sxs, of reported NV and weakness. She states she was doing fine, restarted her imuran that AM and then at lunch developed violent NV. She had no diarrhea or abd pain . No fevers per her report.  On admit no fever, wbc 6. [place] pending. She is on imuran for her lupus.  Repeat UA now with 0-5 wbc, previously 21-50.  Past Medical History:  Diagnosis Date  . Arthritis   . Breast cancer ([place]) 2017   left mastectomy done 11/2015  . Breast cancer in female [facility]) 11/18/2015   Left: 3.9 cm tumor, T2, 1/2 sentinel nodes positive for macro metastatic disease, N1, 3 negative nodes in the axillary tail, ER+,PR+, Her 2 neu, low Mammoprint score  . Cancer [facility])    thyroid takes levothyroxine  . Chronic kidney disease    UTI  . Genetic screening March 2017.   Mammoprint of left breast cancer: Low risk for recurrence.   . Hypertension   . hypothyroidism    secondary to thyroidectomy for thyroid ca  . Hypothyroidism   . Lupus (HCC)    subcutaneous  . Menopause Past Medical History:  Diagnosis Date  . Arthritis   . Breast cancer ([place]) 2017   left mastectomy done 11/2015  . Breast cancer in female [facility]) 11/18/2015   Left: 3.9 cm tumor, T2, 1/2 sentinel nodes positive for macro metastatic disease, N1, 3 negative nodes in the axillary tail, ER+,PR+, Her 2 neu, low Mammoprint score  . Cancer [facility])    thyroid takes levothyroxine  . Chronic kidney disease    UTI  . Genetic screening March 2017.   Mammoprint of left breast cancer: Low risk for recurrence.   . Hypertension   . hypothyroidism    secondary to thyroidectomy for thyroid ca  . Hypothyroidism   . Lupus (HCC)    subcutaneous  .  Menopause 40s   natural, hot flashes and mood lability now gone, off prempro 7 months  . Myocardial infarction ([place]) 2013  . Osteoporosis    Osteopenia  . Rosacea   . Sjoegren syndrome   . Stress-induced cardiomyopathy September of 2013   EF 35%. Peak troponin was 1.8.   40s   natural, hot flashes and mood lability now gone, off prempro 7 months  . Myocardial infarction ([place]) 2013  . Osteoporosis    Osteopenia  . Rosacea   . Sjoegren syndrome   . Stress-induced cardiomyopathy September of 2013   EF 35%. Peak troponin was 1.8.   Past Surgical History:  Procedure Laterality Date  . BACK SURGERY    . BREAST BIOPSY Left 10/30/15   positive, done in Dr. Dwyane Luo office  . CARDIAC CATHETERIZATION  05/2012   [facility]. No significant CAD. Ejection fraction of 35% due to stress-induced cardiomyopathy.  . CHOLECYSTECTOMY    . COLONOSCOPY    . DILATION AND CURETTAGE OF UTERUS    . KYPHOSIS SURGERY  Feb 2008   L1, Dr. Mauri Pole  . LUMBAR DISC SURGERY     L4-L5  . MASTECTOMY Left 11/18/2015   positive  . SENTINEL NODE BIOPSY Left 11/18/2015   Procedure: SENTINEL NODE BIOPSY;  Surgeon: Dellis Filbert  Amedeo Kinsman, MD;  Location: [facility] ORS;  Service: General;  Laterality: Left;  . SHOULDER ARTHROSCOPY  2004   Left, Dr. Jefm Bryant  . SIMPLE MASTECTOMY WITH AXILLARY SENTINEL NODE BIOPSY Left 11/18/2015   Procedure: SIMPLE MASTECTOMY;  Surgeon: Robert Bellow, MD;  Location: [facility] ORS;  Service: General;  Laterality: Left;  . SPINE SURGERY     L4-5 diskectomy  . THYROIDECTOMY     Thyroid Cancer  . TONSILLECTOMY    . TUBAL LIGATION     Social History   Tobacco Use  . Smoking status: Never Smoker  . Smokeless tobacco: Never Used  Substance Use Topics  . Alcohol use: No  . Drug use: No   Family History  Problem Relation Age of Onset  . COPD Father   . Cancer Father        esophageal  . Kidney disease Mother   . Heart disease Mother   . Kidney disease Sister   . Cancer Brother 7       colon  cancer (both brothers)  . Heart attack Brother 80  . Heart disease Brother   . Breast cancer Neg Hx     Allergies:  Allergies  Allergen Reactions  . Amoxicillin Rash    Has patient had a PCN reaction causing immediate rash, facial/tongue/throat swelling, SOB or lightheadedness with hypotension: Unknown Has patient had a PCN reaction causing severe rash involving mucus membranes or skin necrosis: Unknown Has patient had a PCN reaction that required hospitalization: Unknown Has patient had a PCN reaction occurring within the last 10 years: Unknown If all of the above answers are "NO", then may proceed with Cephalosporin use. Tolerates ceftriaxone and Ancef  . Codeine Nausea And Vomiting  . Naprosyn [Naproxen] Swelling  . Orudis [Ketoprofen] Hives  . Plaquenil [Hydroxychloroquine] Hives  . Sulfathiazole Rash    Current antibiotics: Antibiotics Given (last 72 hours)    Date/Time Action Medication Dose Rate   02/09/18 1547 New Bag/Given   vancomycin (VANCOCIN) IVPB 1000 mg/200 mL premix 1,000 mg 200 mL/hr   02/09/18 1547 New Bag/Given   levofloxacin (LEVAQUIN) IVPB 750 mg 750 mg 100 mL/hr   02/09/18 1722 New Bag/Given   aztreonam (AZACTAM) 1 g in sodium chloride 0.9 % 100 mL IVPB 1 g 200 mL/hr   02/09/18 2122 New Bag/Given   ceFAZolin (ANCEF) IVPB 2g/100 mL premix 2 g 200 mL/hr   02/10/18 0517 New Bag/Given   ceFAZolin (ANCEF) IVPB 2g/100 mL premix 2 g 200 mL/hr   02/10/18 1302 New Bag/Given   ceFAZolin (ANCEF) IVPB 2g/100 mL premix 2 g 200 mL/hr      MEDICATIONS: . aspirin EC  81 mg Oral Daily  . azaTHIOprine  75 mg Oral Daily  . calcium-vitamin D  2 tablet Oral BID  . cholecalciferol  2,000 Units Oral Daily  . docusate sodium  100 mg Oral BID  . enoxaparin (LOVENOX) injection  40 mg Subcutaneous Q24H  . letrozole  2.5 mg Oral Daily  . levothyroxine  88 mcg Oral QAC breakfast  . pantoprazole  40 mg Oral Daily  . polyethylene glycol  17 g Oral Daily    Review of  Systems - 11 systems reviewed and negative per HPI   OBJECTIVE: Temp:  [98.1 F (36.7 C)-99.2 F (37.3 C)] 98.2 F (36.8 C) (06/06 1222) Pulse Rate:  [77-136] 87 (06/06 1222) Resp:  [14-30] 17 (06/06 1222) BP: (78-134)/(48-98) 114/55 (06/06 1222) SpO2:  [87 %-100 %] 100 % (06/06 1222) Weight:  [  61.7 kg (136 lb)] 61.7 kg (136 lb) (06/05 1414) Physical Exam  Constitutional:  oriented to person, place, and time. appears well-developed and well-nourished. No distress.  HENT: [place]/AT, PERRLA, no scleral icterus Mouth/Throat: Oropharynx is clear and moist. No oropharyngeal exudate.  Cardiovascular: Normal rate, regular rhythm and normal heart sounds. Exam reveals no gallop and no friction rub.  No murmur heard.  Pulmonary/Chest: Effort normal and breath sounds normal. No respiratory distress.  has no wheezes.  Neck = supple, no nuchal rigidity Abdominal: Soft. Bowel sounds are normal.  exhibits no distension. There is no tenderness.  Lymphadenopathy: no cervical adenopathy. No axillary adenopathy Neurological: alert and oriented to person, place, and time.  Skin: Skin is warm and dry. No rash noted. No erythema.  Psychiatric: a normal mood and affect.  behavior is normal.    LABS: Results for orders placed or performed during the hospital encounter of 02/09/18 (from the past 48 hour(s))  Blood gas, venous ([facility], [facility], [facility])     Status: Abnormal (Preliminary result)   Collection Time: 02/09/18  2:33 PM  Result Value Ref Range   pH, Ven 7.33 7.250 - 7.430   pCO2, Ven 55 44.0 - 60.0 mmHg   pO2, Ven PENDING 32.0 - 45.0 mmHg   Bicarbonate 29.0 (H) 20.0 - 28.0 mmol/L   Acid-Base Excess 2.2 (H) 0.0 - 2.0 mmol/L   O2 Saturation PENDING %   Patient temperature 37.0    Collection site VEIN    Sample type VEIN     Comment: Performed at [facility], [address] [place], [place] [address]  Lipase, blood     Status: Abnormal   Collection Time: 02/09/18  2:53 PM  Result Value Ref  Range   Lipase 55 (H) 11 - 51 U/L    Comment: Performed at [facility], [address] [place], [place] [address]  Comprehensive metabolic panel     Status: Abnormal   Collection Time: 02/09/18  2:53 PM  Result Value Ref Range   Sodium 134 (L) 135 - 145 mmol/L   Potassium 4.6 3.5 - 5.1 mmol/L   Chloride 100 (L) 101 - 111 mmol/L   CO2 26 22 - 32 mmol/L   Glucose, Bld 110 (H) 65 - 99 mg/dL   BUN 16 6 - 20 mg/dL   Creatinine, Ser 0.72 0.44 - 1.00 mg/dL   Calcium 8.6 (L) 8.9 - 10.3 mg/dL   Total Protein 7.0 6.5 - 8.1 g/dL   Albumin 4.0 3.5 - 5.0 g/dL   AST 124 (H) 15 - 41 U/L   ALT 34 14 - 54 U/L   Alkaline Phosphatase 181 (H) 38 - 126 U/L   Total Bilirubin 0.6 0.3 - 1.2 mg/dL   GFR calc non Af Amer >60 >60 mL/min   GFR calc Af Amer >60 >60 mL/min    Comment: (NOTE) The eGFR has been calculated using the CKD EPI equation. This calculation has not been validated in all clinical situations. eGFR's persistently <60 mL/min signify possible Chronic Kidney Disease.    Anion gap 8 5 - 15    Comment: Performed at [facility], [address] [place], [place] [address]  CBC     Status: Abnormal   Collection Time: 02/09/18  2:53 PM  Result Value Ref Range   WBC 6.2 3.6 - 11.0 K/uL   RBC 3.18 (L) 3.80 - 5.20 MIL/uL   Hemoglobin 11.3 (L) 12.0 - 16.0 g/dL   HCT 31.9 (L) 35.0 -  47.0 %   MCV 100.4 (H) 80.0 - 100.0 fL   MCH 35.5 (H) 26.0 - 34.0 pg   MCHC 35.3 32.0 - 36.0 g/dL   RDW 16.0 (H) 11.5 - 14.5 %   Platelets 209 150 - 440 K/uL    Comment: Performed at [facility], [address] [place], [place] [address]  Urinalysis, Complete w Microscopic     Status: Abnormal   Collection Time: 02/09/18  2:53 PM  Result Value Ref Range   Color, Urine YELLOW (A) YELLOW   APPearance CLEAR (A) CLEAR   Specific Gravity, Urine 1.012 1.005 - 1.030   pH 5.0 5.0 - 8.0   Glucose, UA NEGATIVE NEGATIVE mg/dL   Hgb urine dipstick NEGATIVE NEGATIVE   Bilirubin Urine  NEGATIVE NEGATIVE   Ketones, ur NEGATIVE NEGATIVE mg/dL   Protein, ur NEGATIVE NEGATIVE mg/dL   Nitrite NEGATIVE NEGATIVE   Leukocytes, UA NEGATIVE NEGATIVE   WBC, UA 0-5 0 - 5 WBC/hpf   Bacteria, UA NONE SEEN NONE SEEN   Squamous Epithelial / LPF NONE SEEN 0 - 5    Comment: Performed at [facility], [address] [place], [place] [address]  Lactic acid, plasma     Status: None   Collection Time: 02/09/18  2:53 PM  Result Value Ref Range   Lactic Acid, Venous 1.7 0.5 - 1.9 mmol/L    Comment: Performed at [facility], [address] [place], [place] [address]  Blood Culture (routine x 2)     Status: None (Preliminary result)   Collection Time: 02/09/18  2:53 PM  Result Value Ref Range   Specimen Description BLOOD BLOOD RIGHT WRIST    Special Requests      BOTTLES DRAWN AEROBIC AND ANAEROBIC Blood Culture adequate volume   Culture      NO GROWTH < 24 HOURS Performed at [facility], [address] [place], [place] [address]    Report Status PENDING   Blood Culture (routine x 2)     Status: None (Preliminary result)   Collection Time: 02/09/18  2:55 PM  Result Value Ref Range   Specimen Description BLOOD BLOOD RIGHT FOREARM    Special Requests      BOTTLES DRAWN AEROBIC AND ANAEROBIC Blood Culture adequate volume   Culture      NO GROWTH < 24 HOURS Performed at [facility], [address] [place], [place] [address]    Report Status PENDING   Procalcitonin     Status: None   Collection Time: 02/09/18  8:00 PM  Result Value Ref Range   Procalcitonin 4.31 ng/mL    Comment:        Interpretation: PCT > 2 ng/mL: Systemic infection (sepsis) is likely, unless other causes are known. (NOTE)       Sepsis PCT Algorithm           Lower Respiratory Tract                                      Infection PCT Algorithm    ----------------------------     ----------------------------         PCT < 0.25 ng/mL                PCT < 0.10 ng/mL          Strongly encourage  Strongly discourage   discontinuation of antibiotics    initiation of antibiotics    ----------------------------     -----------------------------       PCT 0.25 - 0.50 ng/mL            PCT 0.10 - 0.25 ng/mL               OR       >80% decrease in PCT            Discourage initiation of                                            antibiotics      Encourage discontinuation           of antibiotics    ----------------------------     -----------------------------         PCT >= 0.50 ng/mL              PCT 0.26 - 0.50 ng/mL               AND       <80% decrease in PCT              Encourage initiation of                                             antibiotics       Encourage continuation           of antibiotics    ----------------------------     -----------------------------        PCT >= 0.50 ng/mL                  PCT > 0.50 ng/mL               AND         increase in PCT                  Strongly encourage                                      initiation of antibiotics    Strongly encourage escalation           of antibiotics                                     -----------------------------                                           PCT <= 0.25 ng/mL                                                 OR                                        >  80% decrease in PCT                                     Discontinue / Do not initiate                                             antibiotics Performed at [facility], [address] [place], [place] [address]   Protime-INR     Status: None   Collection Time: 02/09/18  8:00 PM  Result Value Ref Range   Prothrombin Time 13.8 11.4 - 15.2 seconds   INR 1.07     Comment: Performed at [facility], [address] [place], [place] [address]  APTT     Status: Abnormal   Collection Time: 02/09/18  8:00 PM  Result Value Ref Range   aPTT 38 (H) 24 - 36 seconds    Comment:        IF  BASELINE aPTT IS ELEVATED, SUGGEST PATIENT RISK ASSESSMENT BE USED TO DETERMINE APPROPRIATE ANTICOAGULANT THERAPY. Performed at [facility], [address] [place], [place] [address]   Troponin I     Status: None   Collection Time: 02/10/18  6:03 AM  Result Value Ref Range   Troponin I <0.03 <0.03 ng/mL    Comment: Performed at [facility], [address] [place], [place] [address]  Basic metabolic panel     Status: Abnormal   Collection Time: 02/10/18  6:03 AM  Result Value Ref Range   Sodium 132 (L) 135 - 145 mmol/L   Potassium 4.5 3.5 - 5.1 mmol/L   Chloride 105 101 - 111 mmol/L   CO2 22 22 - 32 mmol/L   Glucose, Bld 110 (H) 65 - 99 mg/dL   BUN 14 6 - 20 mg/dL   Creatinine, Ser 0.65 0.44 - 1.00 mg/dL   Calcium 7.2 (L) 8.9 - 10.3 mg/dL   GFR calc non Af Amer >60 >60 mL/min   GFR calc Af Amer >60 >60 mL/min    Comment: (NOTE) The eGFR has been calculated using the CKD EPI equation. This calculation has not been validated in all clinical situations. eGFR's persistently <60 mL/min signify possible Chronic Kidney Disease.    Anion gap 5 5 - 15    Comment: Performed at [facility], [address] [place], [place] [address]   No components found for: ESR, C REACTIVE PROTEIN MICRO: Recent Results (from the past 720 hour(s))  Urine Culture     Status: Abnormal   Collection Time: 02/06/18  5:34 PM  Result Value Ref Range Status   Specimen Description   Final    URINE, RANDOM Performed at [facility], [address], [place], [place] [address]    Special Requests   Final    NONE Performed at [facility], [address] [place], [place] [address]    Culture >=100,000 COLONIES/mL KLEBSIELLA PNEUMONIAE (A)  Final   Report Status 02/09/2018 FINAL  Final   Organism ID, Bacteria KLEBSIELLA PNEUMONIAE (A)  Final      Susceptibility   Klebsiella pneumoniae - MIC*    AMPICILLIN RESISTANT Resistant     CEFAZOLIN <=4  SENSITIVE Sensitive     CEFTRIAXONE <=1 SENSITIVE Sensitive     CIPROFLOXACIN <=0.25 SENSITIVE  Sensitive     GENTAMICIN <=1 SENSITIVE Sensitive     IMIPENEM <=0.25 SENSITIVE Sensitive     NITROFURANTOIN <=16 SENSITIVE Sensitive     TRIMETH/SULFA <=20 SENSITIVE Sensitive     AMPICILLIN/SULBACTAM <=2 SENSITIVE Sensitive     PIP/TAZO <=4 SENSITIVE Sensitive     Extended ESBL NEGATIVE Sensitive     * >=100,000 COLONIES/mL KLEBSIELLA PNEUMONIAE  Blood culture (routine x 2)     Status: None (Preliminary result)   Collection Time: 02/06/18  7:46 PM  Result Value Ref Range Status   Specimen Description BLOOD RIGHT ANTECUBITAL  Final   Special Requests   Final    BOTTLES DRAWN AEROBIC AND ANAEROBIC Blood Culture adequate volume   Culture   Final    NO GROWTH 4 DAYS Performed at [facility], [address] [place], [place] [address]    Report Status PENDING  Incomplete  Blood culture (routine x 2)     Status: None (Preliminary result)   Collection Time: 02/06/18  7:46 PM  Result Value Ref Range Status   Specimen Description BLOOD RIGHT ANTECUBITAL  Final   Special Requests   Final    BOTTLES DRAWN AEROBIC AND ANAEROBIC Blood Culture adequate volume   Culture   Final    NO GROWTH 4 DAYS Performed at [facility], [address] [place], [place] [address]    Report Status PENDING  Incomplete  Blood Culture (routine x 2)     Status: None (Preliminary result)   Collection Time: 02/09/18  2:53 PM  Result Value Ref Range Status   Specimen Description BLOOD BLOOD RIGHT WRIST  Final   Special Requests   Final    BOTTLES DRAWN AEROBIC AND ANAEROBIC Blood Culture adequate volume   Culture   Final    NO GROWTH < 24 HOURS Performed at [facility], [address] [place], [place] [address]    Report Status PENDING  Incomplete  Blood Culture (routine x 2)     Status: None (Preliminary result)   Collection Time: 02/09/18  2:55 PM  Result Value Ref Range Status    Specimen Description BLOOD BLOOD RIGHT FOREARM  Final   Special Requests   Final    BOTTLES DRAWN AEROBIC AND ANAEROBIC Blood Culture adequate volume   Culture   Final    NO GROWTH < 24 HOURS Performed at [facility], [address], [place], [place] [address]    Report Status PENDING  Incomplete    IMAGING: Ct Head Wo Contrast  Result Date: 02/09/2018 CLINICAL DATA:  Mental status changes. EXAM: CT HEAD WITHOUT CONTRAST TECHNIQUE: Contiguous axial images were obtained from the base of the skull through the vertex without intravenous contrast. COMPARISON:  04/03/2014 FINDINGS: Brain: There is no evidence for acute hemorrhage, hydrocephalus, mass lesion, or abnormal extra-axial fluid collection. No definite CT evidence for acute infarction. Diffuse loss of parenchymal volume is consistent with atrophy. Patchy low attenuation in the deep hemispheric and periventricular white matter is nonspecific, but likely reflects chronic microvascular ischemic demyelination. High left parietal chronic infarct stable in the interval. Vascular: No hyperdense vessel or unexpected calcification. Skull: No evidence for fracture. No worrisome lytic or sclerotic lesion. Sinuses/Orbits: The visualized paranasal sinuses and mastoid air cells are clear. Visualized portions of the globes and intraorbital fat are unremarkable. Other: None. IMPRESSION: Stable.  No acute intracranial abnormality. Atrophy with chronic small vessel white matter ischemic disease. Electronically Signed   By: Verda Cumins.D.  On: 02/09/2018 17:00   Dg Chest Port 1 View  Result Date: 02/09/2018 CLINICAL DATA:  Fevers and recent UTI EXAM: PORTABLE CHEST 1 VIEW COMPARISON:  09/23/2017 FINDINGS: Cardiac shadow is within normal limits. The lungs are well aerated bilaterally. Artifact is noted over right lung base. No acute bony abnormality is noted. IMPRESSION: No active disease. Electronically Signed   By: Inez Catalina M.D.   On:  02/09/2018 15:05    Assessment:   Kim Santiago is a 83 y.o. female  history of breast cancer status post mastectomy, hypothyroidism, cutaneous lupus, history of Sjogren's syndrome, osteoporosis, stress-induced cardiomyopathy, with recent admission for fevers and found to have Klebsiella UTI. He was dced on keflex but then yesterday developed sudden onset NV and weakness. She has no fever and now leukocytosis. Clinically improved. She relates sxs to imuran and she may be having an intolerance to it.  Recommendations Cont ancef.  Would rec a 7 day total course (today is day 5) but can change back to keflex at dc to finish the 7 total days. Hold imuran and fu with derm.  Thank you very much for allowing me to participate in the care of this patient. Please call with questions.   Cheral Marker. Ola Spurr, MD

## 2018-02-10 NOTE — Progress Notes (Signed)
*  PRELIMINARY RESULTS* Echocardiogram 2D Echocardiogram has been performed.  Kim Santiago 02/10/2018, 9:44 AM

## 2018-02-10 NOTE — Evaluation (Signed)
Physical Therapy Evaluation Patient Details Name: Kim Santiago MRN: [id number] DOB: [birth year] Today's Date: 02/10/2018   History of Present Illness  Pt admitted for sepsis with complaints of emesis with N/V and headache. HIstory includes breast cancer s/p mastectomy, lupus, Sjogrens syndrome, and cardiomyopathy. Pt with recent discharge yesterday for similar symptoms. Per MD note, may be having reaction to a medicine Imuran. Pt reports feeling much better today  Clinical Impression  Pt is a pleasant 83 year old female who was admitted for sepsis with recent admission for similar symptoms. Pt performs bed mobility with supervision, transfers with cga, and ambulation with cga and use of IV pole. Pt demonstrates deficits with strength/mobility/endurance. Also, due to balance deficits, would recommend use of RW at this time for fall prevention. Is getting close to baseline level. Would benefit from skilled PT to address above deficits and promote optimal return to PLOF. Recommend transition to [place] upon discharge from acute hospitalization.      Follow Up Recommendations Home health PT    Equipment Recommendations  Rolling walker with 5" wheels    Recommendations for Other Services       Precautions / Restrictions Precautions Precautions: Fall Restrictions Weight Bearing Restrictions: No      Mobility  Bed Mobility Overal bed mobility: Needs Assistance Bed Mobility: Supine to Sit     Supine to sit: Supervision     General bed mobility comments: safe technique performed with pt following commands well. No dizziness complaints with exertion  Transfers Overall transfer level: Needs assistance Equipment used: (IV pole) Transfers: Sit to/from Stand Sit to Stand: Min guard         General transfer comment: safe technique, however slight unsteadiness noted. Used IV pole for assistance  Ambulation/Gait Ambulation/Gait assistance: Min guard Ambulation Distance (Feet): 200  Feet Assistive device: IV Pole Gait Pattern/deviations: Step-through pattern     General Gait Details: slow gait pattern and reciprocal steps noted. SLight unsteadiness, however improves with increased distance. Would benefit from further AD training as she relies on B UE for balance  Stairs            Wheelchair Mobility    Modified Rankin (Stroke Patients Only)       Balance Overall balance assessment: Needs assistance;History of Falls Sitting-balance support: Feet supported Sitting balance-Leahy Scale: Good     Standing balance support: Bilateral upper extremity supported Standing balance-Leahy Scale: Fair                               Pertinent Vitals/Pain Pain Assessment: No/denies pain    Home Living Family/patient expects to be discharged to:: Private residence Living Arrangements: Alone Available Help at Discharge: Family(has daughter available) Type of Home: House Home Access: Stairs to enter Entrance Stairs-Rails: None Entrance Stairs-Number of Steps: 2 Home Layout: One level Home Equipment: None      Prior Function Level of Independence: Independent         Comments: was active walking in community, no AD. Reports one fall recently slipping on wet floor     Hand Dominance        Extremity/Trunk Assessment   Upper Extremity Assessment Upper Extremity Assessment: Overall WFL for tasks assessed    Lower Extremity Assessment Lower Extremity Assessment: Generalized weakness(B LE grossly 4/5)       Communication   Communication: No difficulties  Cognition Arousal/Alertness: Awake/alert Behavior During Therapy: WFL for tasks assessed/performed Overall Cognitive Status:  Within Functional Limits for tasks assessed                                        General Comments      Exercises Other Exercises Other Exercises: Pt ambulated to bathroom with cga. Pt with increased urgency and needs min assist for  obstacle avoidance. Once seated on BSC, needs cga for set up and hygiene. Safety maintained   Assessment/Plan    PT Assessment Patient needs continued PT services  PT Problem List Decreased strength;Decreased balance;Decreased mobility;Decreased safety awareness;Decreased knowledge of use of DME       PT Treatment Interventions Gait training;Therapeutic exercise;Stair training;DME instruction    PT Goals (Current goals can be found in the Care Plan section)  Acute Rehab PT Goals Patient Stated Goal: to get stronger PT Goal Formulation: With patient Time For Goal Achievement: 02/24/18 Potential to Achieve Goals: Good    Frequency Min 2X/week   Barriers to discharge        Co-evaluation               AM-PAC PT "6 Clicks" Daily Activity  Outcome Measure Difficulty turning over in bed (including adjusting bedclothes, sheets and blankets)?: A Little Difficulty moving from lying on back to sitting on the side of the bed? : A Little Difficulty sitting down on and standing up from a chair with arms (e.g., wheelchair, bedside commode, etc,.)?: Unable Help needed moving to and from a bed to chair (including a wheelchair)?: A Little Help needed walking in hospital room?: A Little Help needed climbing 3-5 steps with a railing? : A Little 6 Click Score: 16    End of Session Equipment Utilized During Treatment: Gait belt Activity Tolerance: Patient tolerated treatment well Patient left: in chair(alarm pad in chair, however no box in room) Nurse Communication: Mobility status PT Visit Diagnosis: Unsteadiness on feet (R26.81);Muscle weakness (generalized) (M62.81);Difficulty in walking, not elsewhere classified (R26.2)    Time: 4268-3419 PT Time Calculation (min) (ACUTE ONLY): 22 min   Charges:   PT Evaluation $PT Eval Low Complexity: 1 Low PT Treatments $Therapeutic Activity: 8-22 mins   PT G CodesGreggory Stallion, PT,  DPT [phone]   Kyen Taite 02/10/2018, 4:41 PM

## 2018-02-10 NOTE — Progress Notes (Signed)
Per MD okay for RN to DC telemetry and put na order for PT.

## 2018-02-10 NOTE — Patient Outreach (Signed)
[facility]) Care Management  02/10/2018  Kim Santiago Sep 21, 1934 [id number]     Telephone Screen  Referral Date: 02/08/18 Referral Source: [facility] Liasion Referral Reason: " lupus, HTN, care coordination, PCP does TOC, [facility] pharmacist for medication costs and medication changes" Insurance: HTA    Referral received. Patient noted to be readmitted to the hospital on 02/09/18. RN CM will refer case to [facility] hospital liaison for further follow up.    Enzo Montgomery, RN,BSN,CCM [facility] Telephonic Care Management Coordinator Direct Phone: [phone] Toll Free: [phone] Fax: [phone]

## 2018-02-10 NOTE — Progress Notes (Signed)
[place] [facility] [place] NAME: Kim Santiago    MR#:  [id number]  DATE OF BIRTH:  04-Feb-1935  SUBJECTIVE:  CHIEF COMPLAINT: Patient is resting comfortably.  Daughter at bedside. Just got discharged from the hospital on 02/08/2018 Patient strongly believes that azathioprine is causing nausea and vomiting Came back to the hospital and got admitted  REVIEW OF SYSTEMS:  CONSTITUTIONAL: No fever, fatigue or weakness.  EYES: No blurred or double vision.  EARS, NOSE, AND THROAT: No tinnitus or ear pain.  RESPIRATORY: No cough, shortness of breath, wheezing or hemoptysis.  CARDIOVASCULAR: No chest pain, orthopnea, edema.  GASTROINTESTINAL: No nausea, vomiting, diarrhea or abdominal pain.  GENITOURINARY: No dysuria, hematuria.  ENDOCRINE: No polyuria, nocturia,  HEMATOLOGY: No anemia, easy bruising or bleeding SKIN: No rash or lesion. MUSCULOSKELETAL: No joint pain or arthritis.   NEUROLOGIC: No tingling, numbness, weakness.  PSYCHIATRY: No anxiety or depression.   DRUG ALLERGIES:   Allergies  Allergen Reactions  . Amoxicillin Rash    Has patient had a PCN reaction causing immediate rash, facial/tongue/throat swelling, SOB or lightheadedness with hypotension: Unknown Has patient had a PCN reaction causing severe rash involving mucus membranes or skin necrosis: Unknown Has patient had a PCN reaction that required hospitalization: Unknown Has patient had a PCN reaction occurring within the last 10 years: Unknown If all of the above answers are "NO", then may proceed with Cephalosporin use. Tolerates ceftriaxone and Ancef  . Codeine Nausea And Vomiting  . Naprosyn [Naproxen] Swelling  . Orudis [Ketoprofen] Hives  . Plaquenil [Hydroxychloroquine] Hives  . Sulfathiazole Rash    VITALS:  Blood pressure (!) 114/55, pulse 87, temperature 98.2 F (36.8 C), temperature source Oral, resp. rate 17, height 5\' 4"  (1.626 m), weight 61.7 kg (136  lb), SpO2 100 %.  PHYSICAL EXAMINATION:  GENERAL:  83 y.o.-year-old patient lying in the bed with no acute distress.  EYES: Pupils equal, round, reactive to light and accommodation. No scleral icterus. Extraocular muscles intact.  HEENT: Head atraumatic, normocephalic. Oropharynx and nasopharynx clear.  NECK:  Supple, no jugular venous distention. No thyroid enlargement, no tenderness.  LUNGS: Normal breath sounds bilaterally, no wheezing, rales,rhonchi or crepitation. No use of accessory muscles of respiration.  CARDIOVASCULAR: S1, S2 normal. No murmurs, rubs, or gallops.  ABDOMEN: Soft, nontender, nondistended. Bowel sounds present. No organomegaly or mass.  EXTREMITIES: No pedal edema, cyanosis, or clubbing.  NEUROLOGIC: Cranial nerves II through XII are intact. Muscle strength 5/5 in all extremities. Sensation intact. Gait not checked.  PSYCHIATRIC: The patient is alert and oriented x 3.  SKIN: No obvious rash, lesion, or ulcer.    LABORATORY PANEL:   CBC Recent Labs  Lab 02/09/18 1453  WBC 6.2  HGB 11.3*  HCT 31.9*  PLT 209   ------------------------------------------------------------------------------------------------------------------  Chemistries  Recent Labs  Lab 02/09/18 1453 02/10/18 0603  NA 134* 132*  K 4.6 4.5  CL 100* 105  CO2 26 22  GLUCOSE 110* 110*  BUN 16 14  CREATININE 0.72 0.65  CALCIUM 8.6* 7.2*  AST 124*  --   ALT 34  --   ALKPHOS 181*  --   BILITOT 0.6  --    ------------------------------------------------------------------------------------------------------------------  Cardiac Enzymes Recent Labs  Lab 02/10/18 0603  TROPONINI <0.03   ------------------------------------------------------------------------------------------------------------------  RADIOLOGY:  Ct Head Wo Contrast  Result Date: 02/09/2018 CLINICAL DATA:  Mental status changes. EXAM: CT HEAD WITHOUT CONTRAST TECHNIQUE: Contiguous axial images were obtained from the  base of the skull through the vertex without intravenous contrast. COMPARISON:  04/03/2014 FINDINGS: Brain: There is no evidence for acute hemorrhage, hydrocephalus, mass lesion, or abnormal extra-axial fluid collection. No definite CT evidence for acute infarction. Diffuse loss of parenchymal volume is consistent with atrophy. Patchy low attenuation in the deep hemispheric and periventricular white matter is nonspecific, but likely reflects chronic microvascular ischemic demyelination. High left parietal chronic infarct stable in the interval. Vascular: No hyperdense vessel or unexpected calcification. Skull: No evidence for fracture. No worrisome lytic or sclerotic lesion. Sinuses/Orbits: The visualized paranasal sinuses and mastoid air cells are clear. Visualized portions of the globes and intraorbital fat are unremarkable. Other: None. IMPRESSION: Stable.  No acute intracranial abnormality. Atrophy with chronic small vessel white matter ischemic disease. Electronically Signed   By: Misty Stanley M.D.   On: 02/09/2018 17:00   Dg Chest Port 1 View  Result Date: 02/09/2018 CLINICAL DATA:  Fevers and recent UTI EXAM: PORTABLE CHEST 1 VIEW COMPARISON:  09/23/2017 FINDINGS: Cardiac shadow is within normal limits. The lungs are well aerated bilaterally. Artifact is noted over right lung base. No acute bony abnormality is noted. IMPRESSION: No active disease. Electronically Signed   By: Inez Catalina M.D.   On: 02/09/2018 15:05    EKG:   Orders placed or performed during the hospital encounter of 02/09/18  . ED EKG  . ED EKG  . EKG 12-Lead  . EKG 12-Lead    ASSESSMENT AND PLAN:   AcuteSepsis presenting with fever chills-, tachycardia and tachypnea Discharged from hospital on 02/08/18 by me  Previous urine culture and sensitivity from 02/06/2018 has revealed Klebsiella pansensitive patient was discharged with p.o. Keflex, blood cultures were negative Repeat blood and urine cultures collected on 02/09/2018  are pending check echocardiogram to evaluate for possible endocarditis IV fluids for rehydration consult infectious disease for expert opinion  continue close medical monitoring  *Acute Klebsiella UTI  plan of care as stated above  *Elevated LFTs Monitor closely patient is  symptomatic   * hyponatremia and hypochloremia Gentle hydration with IV fluids  *Chronic cutaneous lupus  stable Patient is uncomfortable to continue azathioprine until seen and evaluated by her lupus physician, held azathioprine  *History of breast cancer Stable continue Synthroid  *Acute hypotension with history of hypertension Hold antihypertensives for now, vitals per routine, make changes per necessary  *History of osteoporosis  Stable  continue calcium with vitamin D supplements.   Generalized weakness PT consulted    All the records are reviewed and case discussed with Care Management/Social Workerr. Management plans discussed with the patient, family daughter [name] at bedside and they are in agreement.  CODE STATUS: fc  TOTAL TIME TAKING CARE OF THIS PATIENT: 35 minutes.   POSSIBLE D/C IN 1-2DAYS, DEPENDING ON CLINICAL CONDITION.  Note: This dictation was prepared with Dragon dictation along with smaller phrase technology. Any transcriptional errors that result from this process are unintentional.   Nicholes Mango M.D on 02/10/2018 at 4:56 PM  Between 7am to 6pm - Pager - [phone] After 6pm go to [email or web address] - password [email or web address] [facility]  Office  [phone]  CC: Primary care physician; [name], MD

## 2018-02-11 LAB — CULTURE, BLOOD (ROUTINE X 2)
CULTURE: NO GROWTH
Culture: NO GROWTH
SPECIAL REQUESTS: ADEQUATE
SPECIAL REQUESTS: ADEQUATE

## 2018-02-11 LAB — URINE CULTURE: CULTURE: NO GROWTH

## 2018-02-11 MED ORDER — SODIUM CHLORIDE 1 G PO TABS
2.0000 g | ORAL_TABLET | Freq: Once | ORAL | Status: AC
  Administered 2018-02-11: 2 g via ORAL
  Filled 2018-02-11: qty 2

## 2018-02-11 MED ORDER — ONDANSETRON 4 MG PO TBDP: 4.0000 mg | ORAL_TABLET | Freq: Two times a day (BID) | ORAL | 0 refills | Status: DC

## 2018-02-11 MED ORDER — CEPHALEXIN 500 MG PO CAPS: 500.0000 mg | ORAL_CAPSULE | Freq: Two times a day (BID) | ORAL | 0 refills | Status: DC

## 2018-02-11 MED ORDER — SODIUM CHLORIDE 1 G PO TABS
1.0000 g | ORAL_TABLET | Freq: Once | ORAL | Status: DC
  Filled 2018-02-11: qty 1

## 2018-02-11 MED ORDER — CEPHALEXIN 500 MG PO CAPS: 500.0000 mg | ORAL_CAPSULE | Freq: Two times a day (BID) | ORAL | 0 refills | Status: AC

## 2018-02-11 NOTE — Progress Notes (Signed)
Patient cleared for discharge to home by Dr Margaretmary Eddy        Education complete. AVS printed. Discharge instructions given. All questions answered for patient clarification.  Prescriptions given, pharmacy verified.  IV removed.  Belongings gathered.  Discharged to home Via private vehicle.

## 2018-02-11 NOTE — Discharge Instructions (Signed)
Follow-up with primary care physician in 3 to 5 days Please take nausea medicine zofran  60 minutes prior to antibiotic intake  Health PT, RN, bedside commode arranged

## 2018-02-11 NOTE — Care Management Important Message (Signed)
Copy of signed IM left with patient in room.  

## 2018-02-11 NOTE — Care Management Note (Signed)
Case Management Note  Patient Details  Name: Kim Santiago MRN: [id number] Date of Birth: 01/21/1935   Patient to discharge home today.  Patient lives at home alone. Daughter lives locally for support.  PCP [place].  Patient agreeable to home health services.  Does not have preference of agency.  Referral made to [facility] with [place].  BSC and RW to be delivered to room prior to discharge.  RNCM signing off.   Subjective/Objective:                    Action/Plan:   Expected Discharge Date:  02/11/18               Expected Discharge Plan:  [place]  In-House Referral:     Discharge planning Services  CM Consult  Post Acute Care Choice:  Durable Medical Equipment, Home Health Choice offered to:  Patient  DME Arranged:  Bedside commode, Walker rolling DME Agency:  [place]:  PT [place]:  [place]  Status of Service:  Completed, signed off  If discussed at [place] of Stay Meetings, dates discussed:    Additional Comments:  Beverly Sessions, RN 02/11/2018, 12:01 PM

## 2018-02-11 NOTE — Discharge Summary (Signed)
[facility] NAME: Kim Santiago    MR#:  [id number]  DATE OF BIRTH:  Jan 30, 1935  DATE OF ADMISSION:  02/09/2018 ADMITTING PHYSICIAN: Gorden Harms, MD  DATE OF DISCHARGE:  02/11/18  PRIMARY CARE PHYSICIAN: [name], MD    ADMISSION DIAGNOSIS:  [place] [R68.89] Sepsis, due to unspecified organism ([facility]) [A41.9] Nonintractable headache, unspecified chronicity pattern, unspecified headache type [R51]  DISCHARGE DIAGNOSIS:  Sepsis -probably viral  SECONDARY DIAGNOSIS:   Past Medical History:  Diagnosis Date  . Arthritis   . Breast cancer ([facility]) 2017   left mastectomy done 11/2015  . Breast cancer in female [facility]) 11/18/2015   Left: 3.9 cm tumor, T2, 1/2 sentinel nodes positive for macro metastatic disease, N1, 3 negative nodes in the axillary tail, ER+,PR+, Her 2 neu, low Mammoprint score  . Cancer [facility])    thyroid takes levothyroxine  . Chronic kidney disease    UTI  . Genetic screening March 2017.   Mammoprint of left breast cancer: Low risk for recurrence.   . Hypertension   . hypothyroidism    secondary to thyroidectomy for thyroid ca  . Hypothyroidism   . Lupus ([facility])    subcutaneous  . Menopause 40s   natural, hot flashes and mood lability now gone, off prempro 7 months  . Myocardial infarction ([place]) 2013  . Osteoporosis    Osteopenia  . Rosacea   . Sjoegren syndrome   . Stress-induced cardiomyopathy September of 2013   EF 35%. Peak troponin was 1.8.    HOSPITAL COURSE:   HISTORY OF PRESENT ILLNESS: Kim Santiago  is a 83 y.o. female with a known history of breast cancer status post mastectomy, hypothyroidism, cutaneous lupus, history of Sjogren's syndrome, osteoporosis, stress-induced cardiomyopathy, discharged from hospital on yesterday for sepsis and acute Klebsiella UTI, patient sent home on Keflex, patient returns today with continued ill feeling, chills/Reiger's, generalized weakness, fatigue,  ER work-up noted for tachycardia, tachypnea, hypotension with systolic blood pressure in the 70s, EKG noted for sinus tachycardia with heart rate of 126/anterior infarct, lipase 55, AST 124, lactic acid was normal, chest x-ray negative, UA negative, sodium 134, chloride 100, given IV vancomycin/Levaquin in the emergency room for presumed bacteremia,patient evaluated in the emergency room, daughter present, patient is now been admitted for acute sepsis most likely secondary to Klebsiella UTI, case compounded by chronic immunosuppression from known history of breast cancer and chronic lupus on chronic immunosuppression agents.      AcuteSepsis presenting with fever chills-, tachycardia and tachypnea Discharged from hospital on 02/08/18 by me  Previous urine culture and sensitivity from 02/06/2018 has revealed Klebsiella pansensitive patient was discharged with p.o. Keflex, blood cultures were negative Repeat blood and urine cultures collected on 02/09/2018 are negative nml echocardiogram  IV fluids for rehydration consulted infectious disease for expert opinion, recommending total 7 days of p.o. Keflex, will give 2 more days of p.o. Keflex with nausea medicine(to cover total 7 days of p.o. Keflex  continue close medical monitoring  *Acute Klebsiella UTI  plan of care as stated above  *Elevated LFTs-probably acute phase reactants, primary care physician to repeat LFTs during follow-up visit Monitor closely patient is  asymptomatic   * hyponatremia and hypochloremia Gentle hydration with IV fluids, resume regular diet Sodium 2 g tablet given  *Chronic cutaneous lupus  stable Patient is uncomfortable to continue azathioprine until seen and evaluated by her lupus physician, held azathioprine  *History of breast cancer  Stable continue Synthroid  *Acute hypotension with history of hypertension Blood pressure is at 150 resuming blood pressure medications  *History of osteoporosis   Stable continue calcium with vitamin D supplements.   Generalized weakness PT consulted- home health     DISCHARGE CONDITIONS:   stable  CONSULTS OBTAINED:  Treatment Team:  Leonel Ramsay, MD   PROCEDURES  None   DRUG ALLERGIES:   Allergies  Allergen Reactions  . Amoxicillin Rash    Has patient had a PCN reaction causing immediate rash, facial/tongue/throat swelling, SOB or lightheadedness with hypotension: Unknown Has patient had a PCN reaction causing severe rash involving mucus membranes or skin necrosis: Unknown Has patient had a PCN reaction that required hospitalization: Unknown Has patient had a PCN reaction occurring within the last 10 years: Unknown If all of the above answers are "NO", then may proceed with Cephalosporin use. Tolerates ceftriaxone and Ancef  . Codeine Nausea And Vomiting  . Naprosyn [Naproxen] Swelling  . Orudis [Ketoprofen] Hives  . Plaquenil [Hydroxychloroquine] Hives  . Sulfathiazole Rash    DISCHARGE MEDICATIONS:   Allergies as of 02/11/2018      Reactions   Amoxicillin Rash   Has patient had a PCN reaction causing immediate rash, facial/tongue/throat swelling, SOB or lightheadedness with hypotension: Unknown Has patient had a PCN reaction causing severe rash involving mucus membranes or skin necrosis: Unknown Has patient had a PCN reaction that required hospitalization: Unknown Has patient had a PCN reaction occurring within the last 10 years: Unknown If all of the above answers are "NO", then may proceed with Cephalosporin use. Tolerates ceftriaxone and Ancef   Codeine Nausea And Vomiting   Naprosyn [naproxen] Swelling   Orudis [ketoprofen] Hives   Plaquenil [hydroxychloroquine] Hives   Sulfathiazole Rash      Medication List    STOP taking these medications   AZASAN 75 MG Tabs Generic drug:  Azathioprine     TAKE these medications   aspirin 81 MG tablet Take 81 mg by mouth daily.   Calcium Carbonate-Vitamin D  600-200 MG-UNIT Tabs Take 2 tablets by mouth 2 (two) times daily.   cephALEXin 500 MG capsule Commonly known as:  KEFLEX Take 1 capsule (500 mg total) by mouth 2 (two) times daily for 4 doses.   cholecalciferol 1000 units tablet Commonly known as:  VITAMIN D Take 2,000 Units by mouth daily.   diphenhydrAMINE 25 mg capsule Commonly known as:  BENADRYL Take 1 capsule (25 mg total) by mouth at bedtime as needed.   diphenhydramine-acetaminophen 25-500 MG Tabs tablet Commonly known as:  TYLENOL PM Take 1 tablet by mouth at bedtime.   letrozole 2.5 MG tablet Commonly known as:  FEMARA Take 1 tablet by mouth once daily   levothyroxine 88 MCG tablet Commonly known as:  SYNTHROID, LEVOTHROID Take 1 tablet by mouth daily   metoprolol succinate 25 MG 24 hr tablet Commonly known as:  TOPROL-XL Take 1 tablet by mouth daily   mirtazapine 7.5 MG tablet Commonly known as:  REMERON Take 1 tablet by mouth at bedtime (Dose change)   omeprazole 20 MG capsule Commonly known as:  PRILOSEC Take 1 capsule by mouth 2 (two) times daily.   ondansetron 4 MG disintegrating tablet Commonly known as:  ZOFRAN ODT Take 1 tablet (4 mg total) by mouth 2 (two) times daily. Please take Zofran 1 hour before taking the antibiotic Keflex and 2 times daily as needed   polyethylene glycol packet Commonly known as:  MIRALAX / GLYCOLAX  Take 17 g by mouth daily.   traMADol 50 MG tablet Commonly known as:  ULTRAM Take 1 tablet (50 mg total) by mouth every 8 (eight) hours as needed.            Durable Medical Equipment  (From admission, onward)        Start     Ordered   02/11/18 1231  For home use only DME Bedside commode  Once    Question:  Patient needs a bedside commode to treat with the following condition  Answer:  Weakness   02/11/18 1230   02/11/18 1132  For home use only DME Bedside commode  Once    Question:  Patient needs a bedside commode to treat with the following condition  Answer:   Weakness   02/11/18 1132   02/11/18 1028  For home use only DME Walker rolling  Once    Comments:  Rolling walker with 5 inch wheels  Question:  Patient needs a walker to treat with the following condition  Answer:  Weakness   02/11/18 1028       DISCHARGE INSTRUCTIONS:    Follow-up with primary care physician in 3 to 5 days Please take nausea medicine zofran  60 minutes prior to antibiotic intake  Health PT, RN, bedside commode arranged  DIET:  Cardiac diet  DISCHARGE CONDITION:  Stable  ACTIVITY:  Activity as tolerated and no driving for today  OXYGEN:  Home Oxygen: No.   Oxygen Delivery: room air  DISCHARGE LOCATION:  home   If you experience worsening of your admission symptoms, develop shortness of breath, life threatening emergency, suicidal or homicidal thoughts you must seek medical attention immediately by calling 911 or calling your MD immediately  if symptoms less severe.  You Must read complete instructions/literature along with all the possible adverse reactions/side effects for all the Medicines you take and that have been prescribed to you. Take any new Medicines after you have completely understood and accpet all the possible adverse reactions/side effects.   Please note  You were cared for by a hospitalist during your hospital stay. If you have any questions about your discharge medications or the care you received while you were in the hospital after you are discharged, you can call the unit and asked to speak with the hospitalist on call if the hospitalist that took care of you is not available. Once you are discharged, your primary care physician will handle any further medical issues. Please note that NO REFILLS for any discharge medications will be authorized once you are discharged, as it is imperative that you return to your primary care physician (or establish a relationship with a primary care physician if you do not have one) for your aftercare needs  so that they can reassess your need for medications and monitor your lab values.     Today  Chief Complaint  Patient presents with  . Emesis  . Headache   Patient is feeling fine.  No other episodes of fever.  Okay to discharge patient from ID standpoint holding azathioprine until seen by dermatology. Take nausea medicine prior to antibiotic use  ROS:  CONSTITUTIONAL: Denies fevers, chills. Denies any fatigue, weakness.  EYES: Denies blurry vision, double vision, eye pain. EARS, NOSE, THROAT: Denies tinnitus, ear pain, hearing loss. RESPIRATORY: Denies cough, wheeze, shortness of breath.  CARDIOVASCULAR: Denies chest pain, palpitations, edema.  GASTROINTESTINAL: Denies nausea, vomiting, diarrhea, abdominal pain. Denies bright red blood per rectum. GENITOURINARY: Denies dysuria, hematuria.  ENDOCRINE: Denies nocturia or thyroid problems. HEMATOLOGIC AND LYMPHATIC: Denies easy bruising or bleeding. SKIN: Denies rash or lesion. MUSCULOSKELETAL: Denies pain in neck, back, shoulder, knees, hips or arthritic symptoms.  NEUROLOGIC: Denies paralysis, paresthesias.  PSYCHIATRIC: Denies anxiety or depressive symptoms.   VITAL SIGNS:  Blood pressure (!) 123/58, pulse 86, temperature 98.3 F (36.8 C), temperature source Oral, resp. rate 20, height 5\' 4"  (1.626 m), weight 61.7 kg (136 lb), SpO2 100 %.  I/O:    Intake/Output Summary (Last 24 hours) at 02/11/2018 1359 Last data filed at 02/10/2018 1553 Gross per 24 hour  Intake 240 ml  Output 400 ml  Net -160 ml    PHYSICAL EXAMINATION:  GENERAL:  83 y.o.-year-old patient lying in the bed with no acute distress.  EYES: Pupils equal, round, reactive to light and accommodation. No scleral icterus. Extraocular muscles intact.  HEENT: Head atraumatic, normocephalic. Oropharynx and nasopharynx clear.  NECK:  Supple, no jugular venous distention. No thyroid enlargement, no tenderness.  LUNGS: Normal breath sounds bilaterally, no wheezing,  rales,rhonchi or crepitation. No use of accessory muscles of respiration.  CARDIOVASCULAR: S1, S2 normal. No murmurs, rubs, or gallops.  ABDOMEN: Soft, non-tender, non-distended. Bowel sounds present. No organomegaly or mass.  EXTREMITIES: No pedal edema, cyanosis, or clubbing.  NEUROLOGIC: Cranial nerves II through XII are intact. Muscle strength 5/5 in all extremities. Sensation intact. Gait not checked.  PSYCHIATRIC: The patient is alert and oriented x 3.  SKIN: No obvious rash, lesion, or ulcer.   DATA REVIEW:   CBC Recent Labs  Lab 02/09/18 1453  WBC 6.2  HGB 11.3*  HCT 31.9*  PLT 209    Chemistries  Recent Labs  Lab 02/09/18 1453 02/10/18 0603  NA 134* 132*  K 4.6 4.5  CL 100* 105  CO2 26 22  GLUCOSE 110* 110*  BUN 16 14  CREATININE 0.72 0.65  CALCIUM 8.6* 7.2*  AST 124*  --   ALT 34  --   ALKPHOS 181*  --   BILITOT 0.6  --     Cardiac Enzymes Recent Labs  Lab 02/10/18 0603  TROPONINI <0.03    Microbiology Results  Results for orders placed or performed during the hospital encounter of 02/09/18  Blood Culture (routine x 2)     Status: None (Preliminary result)   Collection Time: 02/09/18  2:53 PM  Result Value Ref Range Status   Specimen Description BLOOD BLOOD RIGHT WRIST  Final   Special Requests   Final    BOTTLES DRAWN AEROBIC AND ANAEROBIC Blood Culture adequate volume   Culture   Final    NO GROWTH 2 DAYS Performed at [facility], [address], [place], [place] [address]    Report Status PENDING  Incomplete  Urine culture     Status: None   Collection Time: 02/09/18  2:53 PM  Result Value Ref Range Status   Specimen Description   Final    URINE, RANDOM Performed at [facility], [address], [place], [place] [address]    Special Requests   Final    NONE Performed at [facility], [address], [place], [place] [address]    Culture   Final    NO GROWTH Performed at [facility], [place]  [address], [place], [place] [address]    Report Status 02/11/2018 FINAL  Final  Blood Culture (routine x 2)     Status: None (Preliminary result)   Collection Time: 02/09/18  2:55  PM  Result Value Ref Range Status   Specimen Description BLOOD BLOOD RIGHT FOREARM  Final   Special Requests   Final    BOTTLES DRAWN AEROBIC AND ANAEROBIC Blood Culture adequate volume   Culture   Final    NO GROWTH 2 DAYS Performed at [facility], [address] [place], [place] [address]    Report Status PENDING  Incomplete    RADIOLOGY:  Ct Head Wo Contrast  Result Date: 02/09/2018 CLINICAL DATA:  Mental status changes. EXAM: CT HEAD WITHOUT CONTRAST TECHNIQUE: Contiguous axial images were obtained from the base of the skull through the vertex without intravenous contrast. COMPARISON:  04/03/2014 FINDINGS: Brain: There is no evidence for acute hemorrhage, hydrocephalus, mass lesion, or abnormal extra-axial fluid collection. No definite CT evidence for acute infarction. Diffuse loss of parenchymal volume is consistent with atrophy. Patchy low attenuation in the deep hemispheric and periventricular white matter is nonspecific, but likely reflects chronic microvascular ischemic demyelination. High left parietal chronic infarct stable in the interval. Vascular: No hyperdense vessel or unexpected calcification. Skull: No evidence for fracture. No worrisome lytic or sclerotic lesion. Sinuses/Orbits: The visualized paranasal sinuses and mastoid air cells are clear. Visualized portions of the globes and intraorbital fat are unremarkable. Other: None. IMPRESSION: Stable.  No acute intracranial abnormality. Atrophy with chronic small vessel white matter ischemic disease. Electronically Signed   By: Misty Stanley M.D.   On: 02/09/2018 17:00   Dg Chest Port 1 View  Result Date: 02/09/2018 CLINICAL DATA:  Fevers and recent UTI EXAM: PORTABLE CHEST 1 VIEW COMPARISON:  09/23/2017 FINDINGS: Cardiac shadow is within normal  limits. The lungs are well aerated bilaterally. Artifact is noted over right lung base. No acute bony abnormality is noted. IMPRESSION: No active disease. Electronically Signed   By: Inez Catalina M.D.   On: 02/09/2018 15:05    EKG:   Orders placed or performed during the hospital encounter of 02/09/18  . ED EKG  . ED EKG  . EKG 12-Lead  . EKG 12-Lead      Management plans discussed with the patient, family and they are in agreement.  CODE STATUS:     Code Status Orders  (From admission, onward)        Start     Ordered   02/09/18 1833  Full code  Continuous     02/09/18 1832    Code Status History    Date Active Date Inactive Code Status Order ID Comments User Context   02/07/2018 1023 02/08/2018 2016 Partial Code [id number]  Nicholes Mango, MD Inpatient   02/06/2018 2141 02/07/2018 1022 Full Code [id number]  Henreitta Leber, MD Inpatient    Advance Directive Documentation     Most Recent Value  Type of Advance Directive  Healthcare Power of [place], Living will  Pre-existing out of facility DNR order (yellow form or pink MOST form)  -  "MOST" Form in Place?  -      TOTAL TIME TAKING CARE OF THIS PATIENT:  42 minutes.   Note: This dictation was prepared with Dragon dictation along with smaller phrase technology. Any transcriptional errors that result from this process are unintentional.   @[facility] @  on 02/11/2018 at 1:59 PM  Between 7am to 6pm - Pager - [phone]  After 6pm go to [email or web address] - password [email or web address] [facility]  Office  [phone]  CC: Primary care physician; [name], MD

## 2018-02-14 ENCOUNTER — Other Ambulatory Visit: Payer: Self-pay | Admitting: Pharmacist

## 2018-02-14 ENCOUNTER — Ambulatory Visit: Payer: Self-pay

## 2018-02-14 LAB — CULTURE, BLOOD (ROUTINE X 2)
CULTURE: NO GROWTH
Culture: NO GROWTH
SPECIAL REQUESTS: ADEQUATE
Special Requests: ADEQUATE

## 2018-02-14 NOTE — Patient Outreach (Signed)
[facility]) Care Management  02/14/2018  Kim Santiago 1934-12-02 [id number]  83 y.o. year old female referred to [facility] for Medication Management (Medication Changes following hospitalization) Referred by Marthenia Rolling, [facility] Liaison for changes in medications and medication assistance.   Hospitalized 02/06/2018-6/42019 for UTI; Hospitalized 02/09/2018-02/11/2018 for Sepsis.   Patient is enrolled in [place].   Was unable to reach patient via telephone today. Unfortunately, voicemail had not been set up. Disconnected call after >12 rings. (unsuccessful outreach #1).  Plan: Will follow-up within 2-4  business days via telephone.  Will send unsuccessful outreach letter and allow patient 10 days to respond prior to closing case.  Ruben Reason, PharmD Clinical Pharmacist, [facility] [phone]

## 2018-02-16 ENCOUNTER — Ambulatory Visit (INDEPENDENT_AMBULATORY_CARE_PROVIDER_SITE_OTHER): Payer: PPO | Admitting: Internal Medicine

## 2018-02-16 ENCOUNTER — Encounter: Payer: Self-pay | Admitting: Internal Medicine

## 2018-02-16 ENCOUNTER — Ambulatory Visit (INDEPENDENT_AMBULATORY_CARE_PROVIDER_SITE_OTHER): Payer: PPO

## 2018-02-16 ENCOUNTER — Telehealth: Payer: Self-pay | Admitting: Internal Medicine

## 2018-02-16 VITALS — BP 150/72 | HR 76 | Temp 98.5°F | Resp 15 | Ht 64.0 in | Wt 140.2 lb

## 2018-02-16 DIAGNOSIS — F331 Major depressive disorder, recurrent, moderate: Secondary | ICD-10-CM | POA: Diagnosis not present

## 2018-02-16 DIAGNOSIS — M546 Pain in thoracic spine: Secondary | ICD-10-CM

## 2018-02-16 DIAGNOSIS — R5383 Other fatigue: Secondary | ICD-10-CM

## 2018-02-16 DIAGNOSIS — S299XXA Unspecified injury of thorax, initial encounter: Secondary | ICD-10-CM | POA: Diagnosis not present

## 2018-02-16 DIAGNOSIS — Z09 Encounter for follow-up examination after completed treatment for conditions other than malignant neoplasm: Secondary | ICD-10-CM | POA: Diagnosis not present

## 2018-02-16 DIAGNOSIS — N3000 Acute cystitis without hematuria: Secondary | ICD-10-CM | POA: Diagnosis not present

## 2018-02-16 DIAGNOSIS — A4151 Sepsis due to Escherichia coli [E. coli]: Secondary | ICD-10-CM | POA: Diagnosis not present

## 2018-02-16 DIAGNOSIS — E8809 Other disorders of plasma-protein metabolism, not elsewhere classified: Secondary | ICD-10-CM | POA: Diagnosis not present

## 2018-02-16 DIAGNOSIS — R609 Edema, unspecified: Secondary | ICD-10-CM | POA: Diagnosis not present

## 2018-02-16 MED ORDER — FUROSEMIDE 20 MG PO TABS: ORAL_TABLET | ORAL | 3 refills | Status: DC

## 2018-02-16 MED ORDER — TIZANIDINE HCL 2 MG PO CAPS: 2.0000 mg | ORAL_CAPSULE | Freq: Three times a day (TID) | ORAL | 0 refills | Status: DC

## 2018-02-16 MED ORDER — TRAMADOL HCL 50 MG PO TABS: 50.0000 mg | ORAL_TABLET | Freq: Three times a day (TID) | ORAL | 2 refills | Status: DC | PRN

## 2018-02-16 NOTE — Telephone Encounter (Signed)
I dont see where Dr. Derrel Nip has prescribed this before but she did mention in her last note that it is ok for the patient to take. Please advise

## 2018-02-16 NOTE — Telephone Encounter (Signed)
furosemide sent to [facility]

## 2018-02-16 NOTE — Telephone Encounter (Unsigned)
Copied from [place] [id number]. Topic: Quick Communication - Rx Refill/Question >> Feb 16, 2018  4:24 PM Neva Seat wrote: Furosemide Pt is needing this filled from today's visit.  [id number] [address], [place] [address] Phone: [phone] Fax: [phone]

## 2018-02-16 NOTE — Progress Notes (Signed)
Subjective:  Patient ID: Kim Santiago, female    DOB: 07-Jul-1935  Age: 83 y.o. MRN: [id number]  CC: The primary encounter diagnosis was Acute left-sided thoracic back pain. Diagnoses of Hypocalcemia, Fatigue, unspecified type, Sepsis due to Escherichia coli [facility]), Major depressive disorder, recurrent episode, moderate ([place]), Edema due to hypoalbuminemia, Acute cystitis without hematuria, and Hospital discharge follow-up were also pertinent to this visit.  HPI Kim Santiago presents for hospital follow up . She has had  2 admissions in the last month.  She is accompanied by her daughter.  Admitted June 2  With sepsis secondary to UTI.   Treated empirically with ceftriaxone and discharged on June 4 with oral Keflex pending urine and blood cultures.  Told to follow up with PCP.   (BCs were negative,  Urine grew Klebsiella  Pan sensitive to cephalosporins)  Readmitted Jun 5 (less than 24 hours later) with recurrent vomiting  And rigors,  Hypotension  And headache.  Admitting diagnosis again Sepsis.  Repeat workup done .  IV fluids given,  Anti hypertensive medications were  suspended.  Repeat blood and urine cultures were negative.   azothiaprine was stopped (started in May by dermatologist for management of cutaneous lupus still taking Placquenil ).  disccharged home on Jun 7   Since discharge she has finished the course of cephalexin without nausea or vomiting.  still feels fatigued and achy,  Noting fluid retention  And muscle cramping. Marland Kitchen Appetite poor.  Taking a probiotic. No diarrhea   Medications, discharge summary,  All recent labs and cultures reviewed.  She has not resumed losartan (stopped during admission).  Apparently she is Not taking remeron for unclear reasons.  Her daughter (thinks she needs it)   [place] saw her yesterday. Joint decision PT not needed . However patient is disinclined and not motivated to regain strength without encouragement due to malaise.   Had 2  falls  prior to her June 2 admission. 1) walking on a Wet kitchen floor,  2) sat down and chair rolled away .  Some back pain since then , saw chiropractor for back pain  Occurring on the left side   [facility] [place] [facility]    Outpatient Medications Prior to Visit  Medication Sig Dispense Refill  . aspirin 81 MG tablet Take 81 mg by mouth daily.    . Calcium Carbonate-Vitamin D (CALCIUM + D) 600-200 MG-UNIT TABS Take 2 tablets by mouth 2 (two) times daily.     . cholecalciferol (VITAMIN D) 1000 UNITS tablet Take 2,000 Units by mouth daily.     . diphenhydramine-acetaminophen (TYLENOL PM) 25-500 MG TABS tablet Take 1 tablet by mouth at bedtime.     Marland Kitchen letrozole (FEMARA) 2.5 MG tablet Take 1 tablet by mouth once daily 90 tablet 1  . levothyroxine (SYNTHROID, LEVOTHROID) 88 MCG tablet Take 1 tablet by mouth daily 90 tablet 0  . metoprolol succinate (TOPROL-XL) 25 MG 24 hr tablet Take 1 tablet by mouth daily 90 tablet 1  . mirtazapine (REMERON) 7.5 MG tablet Take 1 tablet by mouth at bedtime (Dose change) 90 tablet 1  . omeprazole (PRILOSEC) 20 MG capsule Take 1 capsule by mouth 2 (two) times daily.    . polyethylene glycol (MIRALAX / GLYCOLAX) packet Take 17 g by mouth daily.    Marland Kitchen azaTHIOprine (IMURAN) 50 MG tablet Take 50 mg by mouth daily.    . tizanidine (ZANAFLEX) 2  MG capsule Take 2 mg by mouth 3 (three) times daily.    . traMADol (ULTRAM) 50 MG tablet Take 1 tablet (50 mg total) by mouth every 8 (eight) hours as needed. 60 tablet 2  . diphenhydrAMINE (BENADRYL) 25 mg capsule Take 1 capsule (25 mg total) by mouth at bedtime as needed. (Patient not taking: Reported on 02/16/2018) 30 capsule 0  . ondansetron (ZOFRAN ODT) 4 MG disintegrating tablet Take 1 tablet (4 mg total) by mouth 2 (two) times daily. Please take Zofran 1 hour before taking the antibiotic Keflex and 2 times daily as needed (Patient not taking: Reported on 02/16/2018) 10 tablet 0   No  facility-administered medications prior to visit.     Review of Systems;  Patient denies headache, fevers, malaise, unintentional weight loss, skin rash, eye pain, sinus congestion and sinus pain, sore throat, dysphagia,  hemoptysis , cough, dyspnea, wheezing, chest pain, palpitations, orthopnea, edema, abdominal pain, nausea, melena, diarrhea, constipation, flank pain, dysuria, hematuria, urinary  Frequency, nocturia, numbness, tingling, seizures,  Focal weakness, Loss of consciousness,  Tremor, insomnia, depression, anxiety, and suicidal ideation.      Objective:  BP (!) 150/72 (BP Location: Right Arm, Patient Position: Sitting, Cuff Size: Normal)   Pulse 76   Temp 98.5 F (36.9 C) (Oral)   Resp 15   Ht 5\' 4"  (1.626 m)   Wt 140 lb 3.2 oz (63.6 kg)   SpO2 99%   BMI 24.07 kg/m   BP Readings from Last 3 Encounters:  02/16/18 (!) 150/72  02/11/18 (!) 123/58  02/08/18 119/61    Wt Readings from Last 3 Encounters:  02/16/18 140 lb 3.2 oz (63.6 kg)  02/09/18 136 lb (61.7 kg)  02/06/18 138 lb 9.6 oz (62.9 kg)    General appearance: alert, cooperative and appears stated age Ears: normal TM's and external ear canals both ears Throat: lips, mucosa, and tongue normal; teeth and gums normal Neck: no adenopathy, no carotid bruit, supple, symmetrical, trachea midline and thyroid not enlarged, symmetric, no tenderness/mass/nodules Back: symmetric, no curvature. ROM normal. No CVA tenderness. Lungs: clear to auscultation bilaterally Heart: regular rate and rhythm, S1, S2 normal, no murmur, click, rub or gallop Abdomen: soft, non-tender; bowel sounds normal; no masses,  no organomegaly MSK: no spinal tenderness,  No bruises on back or ribs. Not able to rise from chair without use of arms.  Pulses: 2+ and symmetric Skin: Skin color, texture, turgor normal. No rashes or lesions Lymph nodes: Cervical, supraclavicular, and axillary nodes normal.  No results found for: HGBA1C  Lab Results    Component Value Date   CREATININE 0.65 02/10/2018   CREATININE 0.72 02/09/2018   CREATININE 0.76 02/06/2018    Lab Results  Component Value Date   WBC 6.2 02/09/2018   HGB 11.3 (L) 02/09/2018   HCT 31.9 (L) 02/09/2018   PLT 209 02/09/2018   GLUCOSE 110 (H) 02/10/2018   CHOL 170 06/08/2016   TRIG 86.0 06/08/2016   HDL 44.20 06/08/2016   LDLDIRECT 117.6 12/27/2012   LDLCALC 108 (H) 06/08/2016   ALT 34 02/09/2018   AST 124 (H) 02/09/2018   NA 132 (L) 02/10/2018   K 4.5 02/10/2018   CL 105 02/10/2018   CREATININE 0.65 02/10/2018   BUN 14 02/10/2018   CO2 22 02/10/2018   TSH 1.70 02/03/2017   INR 1.07 02/09/2018    No results found.  Assessment & Plan:   Problem List Items Addressed This Visit    UTI (urinary tract  infection)    Klebsiella  sensitive to cephalosporins.  Repeat Urine cultrue during readmission was negative      Sepsis ([place])    Cultures reviewed,  All blood cultures were negative and repeat urine culture suggests that her original Klebsiella UTI was treated to resolution with the appropriate antibiotic.  Sepsis syndrome may have been aggravated by use of azothioprine per PNs from hospitalist and medicatio nwas stopped       Major depressive disorder, recurrent episode, moderate ([place])    Secondary to loss of husband,  Health issues.  Resume remeron       Hospital discharge follow-up    Patient is stable post discharge and has no new issues or questions about discharge plans at the visit today for hospital follow up. All labs , imaging studies and progress notes from admission were reviewed with patient today  .        Edema due to hypoalbuminemia    Secondary to decreased protein stores and aggressive IV fluid resuscitation during recent admission for sepsis .Marland Kitchen  Lasix prescribed,  Increased protein intake recommended.          Other Visit Diagnoses    Acute left-sided thoracic back pain    -  Primary   Relevant Medications   tizanidine  (ZANAFLEX) 2 MG capsule   traMADol (ULTRAM) 50 MG tablet   Other Relevant Orders   DG Thoracic Spine W/Swimmers (Completed)   Hypocalcemia       Relevant Orders   Calcium, ionized   Comprehensive metabolic panel   Fatigue, unspecified type       Relevant Orders   CBC with Differential/Platelet      I have discontinued Aanvi R. Windholz's diphenhydrAMINE, ondansetron, and azaTHIOprine. I have also changed her tizanidine. Additionally, I am having her maintain her cholecalciferol, Calcium Carbonate-Vitamin D, aspirin, polyethylene glycol, diphenhydramine-acetaminophen, mirtazapine, metoprolol succinate, letrozole, levothyroxine, omeprazole, and traMADol.  Meds ordered this encounter  Medications  . tizanidine (ZANAFLEX) 2 MG capsule    Sig: Take 1 capsule (2 mg total) by mouth 3 (three) times daily.    Dispense:  90 capsule    Refill:  0  . DISCONTD: traMADol (ULTRAM) 50 MG tablet    Sig: Take 1 tablet (50 mg total) by mouth every 8 (eight) hours as needed.    Dispense:  60 tablet    Refill:  2  . traMADol (ULTRAM) 50 MG tablet    Sig: Take 1 tablet (50 mg total) by mouth every 8 (eight) hours as needed.    Dispense:  60 tablet    Refill:  2    Medications Discontinued During This Encounter  Medication Reason  . diphenhydrAMINE (BENADRYL) 25 mg capsule Patient has not taken in last 30 days  . ondansetron (ZOFRAN ODT) 4 MG disintegrating tablet Patient has not taken in last 30 days  . azaTHIOprine (IMURAN) 50 MG tablet   . tizanidine (ZANAFLEX) 2 MG capsule Reorder  . traMADol (ULTRAM) 50 MG tablet Reorder  . traMADol (ULTRAM) 50 MG tablet Reorder    Follow-up: Return in about 3 months (around 05/19/2018).   [name], MD

## 2018-02-16 NOTE — Telephone Encounter (Signed)
Medication has been sent to pharmacy.  °

## 2018-02-16 NOTE — Telephone Encounter (Signed)
Please advise. Not sure what mg.

## 2018-02-16 NOTE — Patient Instructions (Addendum)
Please resume the remeron for your appetite  Increase your salt intake .  Your muscles are cramping due to low sodium   You can use lasix every other day until the fluid retention resolves. After that just use as needed   Do not resume losartan until home readings are over 150 for 3 days  In a row  Increase your protein intake .  Continue probiotics for life    Return next week for non fasting labs

## 2018-02-17 ENCOUNTER — Other Ambulatory Visit: Payer: Self-pay | Admitting: Pharmacist

## 2018-02-17 NOTE — Patient Outreach (Signed)
[facility]) Care Management  02/17/2018  Kim Santiago [birth year] [id number]   83 y.o. year old female referred to [facility] for Case Closure Referred by Marthenia Rolling, [facility] Liaison for changes in medications and medication assistance. Hospitalized 02/06/2018 - 02/08/2018 for UTI; Hospitalized 02/09/2018 - 02/11/2018 for sepsis.   Patient with [employer] Medicare Advantage plan.   Patient declines [facility] pharmacy services at this time. I will remove myself from the care team and close out her case as I am the last discipline involved.   Ruben Reason, PharmD Clinical Pharmacist, [facility] [phone]

## 2018-02-18 ENCOUNTER — Encounter: Payer: Self-pay | Admitting: Internal Medicine

## 2018-02-18 DIAGNOSIS — Z8781 Personal history of (healed) traumatic fracture: Secondary | ICD-10-CM | POA: Insufficient documentation

## 2018-02-19 LAB — BLOOD GAS, VENOUS
Acid-Base Excess: 2.2 mmol/L — ABNORMAL HIGH (ref 0.0–2.0)
BICARBONATE: 29 mmol/L — AB (ref 20.0–28.0)
PCO2 VEN: 55 mmHg (ref 44.0–60.0)
Patient temperature: 37
pH, Ven: 7.33 (ref 7.250–7.430)

## 2018-02-20 DIAGNOSIS — E8809 Other disorders of plasma-protein metabolism, not elsewhere classified: Secondary | ICD-10-CM | POA: Insufficient documentation

## 2018-02-20 DIAGNOSIS — R609 Edema, unspecified: Secondary | ICD-10-CM

## 2018-02-20 DIAGNOSIS — Z09 Encounter for follow-up examination after completed treatment for conditions other than malignant neoplasm: Secondary | ICD-10-CM | POA: Insufficient documentation

## 2018-02-20 NOTE — Assessment & Plan Note (Signed)
Secondary to loss of husband,  Health issues.  Resume remeron

## 2018-02-20 NOTE — Assessment & Plan Note (Signed)
Secondary to decreased protein stores and aggressive IV fluid resuscitation during recent admission for sepsis .Marland Kitchen  Lasix prescribed,  Increased protein intake recommended.

## 2018-02-20 NOTE — Assessment & Plan Note (Signed)
Klebsiella  sensitive to cephalosporins.  Repeat Urine cultrue during readmission was negative

## 2018-02-20 NOTE — Assessment & Plan Note (Signed)
Patient is stable post discharge and has no new issues or questions about discharge plans at the visit today for hospital follow up. All labs , imaging studies and progress notes from admission were reviewed with patient today   

## 2018-02-20 NOTE — Assessment & Plan Note (Addendum)
Cultures reviewed,  All blood cultures were negative and repeat urine culture suggests that her original Klebsiella UTI was treated to resolution with the appropriate antibiotic.  Sepsis syndrome may have been aggravated by use of azothioprine per PNs from hospitalist and medicatio nwas stopped

## 2018-02-23 ENCOUNTER — Other Ambulatory Visit: Payer: PPO

## 2018-02-23 ENCOUNTER — Other Ambulatory Visit (INDEPENDENT_AMBULATORY_CARE_PROVIDER_SITE_OTHER): Payer: PPO

## 2018-02-23 ENCOUNTER — Telehealth: Payer: Self-pay | Admitting: *Deleted

## 2018-02-23 DIAGNOSIS — N390 Urinary tract infection, site not specified: Secondary | ICD-10-CM

## 2018-02-23 DIAGNOSIS — R5383 Other fatigue: Secondary | ICD-10-CM

## 2018-02-23 DIAGNOSIS — E032 Hypothyroidism due to medicaments and other exogenous substances: Secondary | ICD-10-CM

## 2018-02-23 LAB — TSH: TSH: 0.81 u[IU]/mL (ref 0.35–4.50)

## 2018-02-23 LAB — COMPREHENSIVE METABOLIC PANEL
ALT: 6 U/L (ref 0–35)
AST: 17 U/L (ref 0–37)
Albumin: 3.8 g/dL (ref 3.5–5.2)
Alkaline Phosphatase: 80 U/L (ref 39–117)
BUN: 14 mg/dL (ref 6–23)
CHLORIDE: 103 meq/L (ref 96–112)
CO2: 32 meq/L (ref 19–32)
Calcium: 9.8 mg/dL (ref 8.4–10.5)
Creatinine, Ser: 0.73 mg/dL (ref 0.40–1.20)
GFR: 81.3 mL/min (ref >60.00)
Glucose, Bld: 93 mg/dL (ref 70–99)
POTASSIUM: 4.9 meq/L (ref 3.5–5.1)
SODIUM: 140 meq/L (ref 135–145)
Total Bilirubin: 0.4 mg/dL (ref 0.2–1.2)
Total Protein: 6.5 g/dL (ref 6.0–8.3)

## 2018-02-23 LAB — CBC WITH DIFFERENTIAL/PLATELET
BASOS ABS: 0.1 10*3/uL (ref 0.0–0.1)
Basophils Relative: 2.1 % (ref 0.0–3.0)
Eosinophils Absolute: 0.1 10*3/uL (ref 0.0–0.7)
Eosinophils Relative: 2.1 % (ref 0.0–5.0)
HEMATOCRIT: 32 % — AB (ref 36.0–46.0)
Hemoglobin: 11.1 g/dL — ABNORMAL LOW (ref 12.0–15.0)
LYMPHS PCT: 41.6 % (ref 12.0–46.0)
Lymphs Abs: 1.3 10*3/uL (ref 0.7–4.0)
MCHC: 34.6 g/dL (ref 30.0–36.0)
MCV: 100.8 fl — ABNORMAL HIGH (ref 78.0–100.0)
MONOS PCT: 11.5 % (ref 3.0–12.0)
Monocytes Absolute: 0.4 10*3/uL (ref 0.1–1.0)
NEUTROS PCT: 42.7 % — AB (ref 43.0–77.0)
Neutro Abs: 1.4 10*3/uL (ref 1.4–7.7)
PLATELETS: 224 10*3/uL (ref 150.0–400.0)
RBC: 3.18 Mil/uL — AB (ref 3.87–5.11)
RDW: 16 % — AB (ref 11.5–15.5)
WBC: 3.2 10*3/uL — ABNORMAL LOW (ref 4.0–10.5)

## 2018-02-23 NOTE — Telephone Encounter (Signed)
I did NOT order a urinalysis.  There is not need unless she has urinary symptoms like burning , frequency or new onset incontinence.

## 2018-02-23 NOTE — Telephone Encounter (Signed)
See below when calling pt with lab results. She will probably ask about her urine.

## 2018-02-23 NOTE — Telephone Encounter (Signed)
Spoke with pt and she stated that she was in the office for a hospital follow up from a UTI. Pt stated that she was told to bring a urine back with her when she came in for her blood work to make sure the UTI had cleared, because when pt went in the hospital she did not even know she had a UTI.

## 2018-02-23 NOTE — Telephone Encounter (Signed)
No urine micro or culture was done due to urine being contaminated because it wasn't sealed correctly by patient and urine was also collected on Monday.

## 2018-02-23 NOTE — Telephone Encounter (Signed)
l urine with micro and culture ordered

## 2018-02-23 NOTE — Addendum Note (Signed)
Addended by: [name] on: 02/23/2018 02:59 PM   Modules accepted: Orders

## 2018-02-23 NOTE — Telephone Encounter (Signed)
Pt came in this morning for labs & brought in a urine specimen. I told pt that no urine was ordered & she said that I was wrong. Please place order if urine is needed.

## 2018-02-23 NOTE — Telephone Encounter (Signed)
I do not recall that but if she was asymptomatic during the first infection,  We can recheck her urine

## 2018-02-24 LAB — CALCIUM, IONIZED: CALCIUM ION: 5.2 mg/dL (ref 4.8–5.6)

## 2018-02-28 ENCOUNTER — Emergency Department: Payer: PPO

## 2018-02-28 ENCOUNTER — Ambulatory Visit (INDEPENDENT_AMBULATORY_CARE_PROVIDER_SITE_OTHER)
Admission: EM | Admit: 2018-02-28 | Discharge: 2018-02-28 | Disposition: A | Discharge status: Home / Self Care | Payer: PPO | Source: Home / Self Care | Attending: Emergency Medicine | Admitting: Emergency Medicine

## 2018-02-28 ENCOUNTER — Encounter: Payer: Self-pay | Admitting: Emergency Medicine

## 2018-02-28 ENCOUNTER — Ambulatory Visit: Payer: PPO

## 2018-02-28 ENCOUNTER — Encounter: Payer: Self-pay | Admitting: *Deleted

## 2018-02-28 ENCOUNTER — Other Ambulatory Visit: Payer: Self-pay

## 2018-02-28 ENCOUNTER — Emergency Department
Admission: EM | Admit: 2018-02-28 | Discharge: 2018-02-28 | Disposition: A | Discharge status: Home / Self Care | Payer: PPO | Attending: Emergency Medicine | Admitting: Emergency Medicine

## 2018-02-28 ENCOUNTER — Ambulatory Visit: Payer: Self-pay | Admitting: Internal Medicine

## 2018-02-28 DIAGNOSIS — R109 Unspecified abdominal pain: Secondary | ICD-10-CM

## 2018-02-28 DIAGNOSIS — Z880 Allergy status to penicillin: Secondary | ICD-10-CM | POA: Insufficient documentation

## 2018-02-28 DIAGNOSIS — Z886 Allergy status to analgesic agent status: Secondary | ICD-10-CM

## 2018-02-28 DIAGNOSIS — I129 Hypertensive chronic kidney disease with stage 1 through stage 4 chronic kidney disease, or unspecified chronic kidney disease: Secondary | ICD-10-CM | POA: Insufficient documentation

## 2018-02-28 DIAGNOSIS — R110 Nausea: Secondary | ICD-10-CM | POA: Diagnosis not present

## 2018-02-28 DIAGNOSIS — Z85850 Personal history of malignant neoplasm of thyroid: Secondary | ICD-10-CM | POA: Insufficient documentation

## 2018-02-28 DIAGNOSIS — N3000 Acute cystitis without hematuria: Secondary | ICD-10-CM | POA: Diagnosis not present

## 2018-02-28 DIAGNOSIS — R509 Fever, unspecified: Secondary | ICD-10-CM

## 2018-02-28 DIAGNOSIS — Z79890 Hormone replacement therapy (postmenopausal): Secondary | ICD-10-CM | POA: Insufficient documentation

## 2018-02-28 DIAGNOSIS — R5383 Other fatigue: Secondary | ICD-10-CM | POA: Diagnosis not present

## 2018-02-28 DIAGNOSIS — R112 Nausea with vomiting, unspecified: Secondary | ICD-10-CM

## 2018-02-28 DIAGNOSIS — R891 Abnormal level of hormones in specimens from other organs, systems and tissues: Secondary | ICD-10-CM | POA: Diagnosis not present

## 2018-02-28 DIAGNOSIS — Z885 Allergy status to narcotic agent status: Secondary | ICD-10-CM

## 2018-02-28 DIAGNOSIS — R1114 Bilious vomiting: Secondary | ICD-10-CM | POA: Diagnosis not present

## 2018-02-28 DIAGNOSIS — R000 Tachycardia, unspecified: Secondary | ICD-10-CM | POA: Diagnosis not present

## 2018-02-28 DIAGNOSIS — N189 Chronic kidney disease, unspecified: Secondary | ICD-10-CM | POA: Diagnosis not present

## 2018-02-28 DIAGNOSIS — E039 Hypothyroidism, unspecified: Secondary | ICD-10-CM | POA: Diagnosis not present

## 2018-02-28 DIAGNOSIS — R5381 Other malaise: Secondary | ICD-10-CM | POA: Diagnosis present

## 2018-02-28 DIAGNOSIS — Z79891 Long term (current) use of opiate analgesic: Secondary | ICD-10-CM | POA: Insufficient documentation

## 2018-02-28 DIAGNOSIS — Z79899 Other long term (current) drug therapy: Secondary | ICD-10-CM | POA: Insufficient documentation

## 2018-02-28 DIAGNOSIS — R946 Abnormal results of thyroid function studies: Secondary | ICD-10-CM | POA: Diagnosis not present

## 2018-02-28 DIAGNOSIS — Z9049 Acquired absence of other specified parts of digestive tract: Secondary | ICD-10-CM | POA: Diagnosis not present

## 2018-02-28 DIAGNOSIS — Z7982 Long term (current) use of aspirin: Secondary | ICD-10-CM | POA: Insufficient documentation

## 2018-02-28 DIAGNOSIS — R1110 Vomiting, unspecified: Secondary | ICD-10-CM | POA: Diagnosis not present

## 2018-02-28 DIAGNOSIS — Z853 Personal history of malignant neoplasm of breast: Secondary | ICD-10-CM | POA: Diagnosis not present

## 2018-02-28 DIAGNOSIS — R7989 Other specified abnormal findings of blood chemistry: Secondary | ICD-10-CM

## 2018-02-28 LAB — COMPREHENSIVE METABOLIC PANEL
ALBUMIN: 4.2 g/dL (ref 3.5–5.0)
ALT: 7 U/L — AB (ref 14–54)
AST: 28 U/L (ref 15–41)
Alkaline Phosphatase: 97 U/L (ref 38–126)
Anion gap: 13 (ref 5–15)
BILIRUBIN TOTAL: 0.7 mg/dL (ref 0.3–1.2)
BUN: 13 mg/dL (ref 6–20)
CO2: 24 mmol/L (ref 22–32)
CREATININE: 0.75 mg/dL (ref 0.44–1.00)
Calcium: 9 mg/dL (ref 8.9–10.3)
Chloride: 100 mmol/L — ABNORMAL LOW (ref 101–111)
GFR calc Af Amer: >60 mL/min (ref >60)
GLUCOSE: 133 mg/dL — AB (ref 65–99)
POTASSIUM: 3.9 mmol/L (ref 3.5–5.1)
Sodium: 137 mmol/L (ref 135–145)
TOTAL PROTEIN: 7.8 g/dL (ref 6.5–8.1)

## 2018-02-28 LAB — CBC WITH DIFFERENTIAL/PLATELET
BASOS PCT: 0 %
Basophils Absolute: 0 10*3/uL (ref 0–0.1)
EOS PCT: 1 %
Eosinophils Absolute: 0 10*3/uL (ref 0–0.7)
HCT: 34.6 % — ABNORMAL LOW (ref 35.0–47.0)
Hemoglobin: 12 g/dL (ref 12.0–16.0)
Lymphocytes Relative: 37 %
Lymphs Abs: 1.4 10*3/uL (ref 1.0–3.6)
MCH: 34.4 pg — ABNORMAL HIGH (ref 26.0–34.0)
MCHC: 34.8 g/dL (ref 32.0–36.0)
MCV: 98.7 fL (ref 80.0–100.0)
MONO ABS: 0 10*3/uL — AB (ref 0.2–0.9)
Monocytes Relative: 1 %
NEUTROS ABS: 2.4 10*3/uL (ref 1.4–6.5)
Neutrophils Relative %: 61 %
Platelets: 232 10*3/uL (ref 150–440)
RBC: 3.5 MIL/uL — ABNORMAL LOW (ref 3.80–5.20)
RDW: 16 % — AB (ref 11.5–14.5)
WBC: 3.9 10*3/uL (ref 3.6–11.0)

## 2018-02-28 LAB — URINALYSIS, COMPLETE (UACMP) WITH MICROSCOPIC
BACTERIA UA: NONE SEEN
Bilirubin Urine: NEGATIVE
Bilirubin Urine: NEGATIVE
GLUCOSE, UA: NEGATIVE mg/dL
Glucose, UA: NEGATIVE mg/dL
Hgb urine dipstick: NEGATIVE
Hgb urine dipstick: NEGATIVE
KETONES UR: NEGATIVE mg/dL
KETONES UR: NEGATIVE mg/dL
LEUKOCYTES UA: NEGATIVE
Nitrite: NEGATIVE
Nitrite: NEGATIVE
PH: 7 (ref 5.0–8.0)
PROTEIN: NEGATIVE mg/dL
PROTEIN: NEGATIVE mg/dL
RBC / HPF: NONE SEEN RBC/hpf (ref 0–5)
Specific Gravity, Urine: <1.005 — ABNORMAL LOW (ref 1.005–1.030)
Specific Gravity, Urine: 1.014 (ref 1.005–1.030)
pH: 5 (ref 5.0–8.0)

## 2018-02-28 LAB — TSH: TSH: 0.093 u[IU]/mL — ABNORMAL LOW (ref 0.350–4.500)

## 2018-02-28 LAB — LIPASE, BLOOD: LIPASE: 22 U/L (ref 11–51)

## 2018-02-28 LAB — TROPONIN I

## 2018-02-28 MED ORDER — ONDANSETRON 4 MG PO TBDP
4.0000 mg | ORAL_TABLET | Freq: Once | ORAL | Status: AC
  Administered 2018-02-28: 4 mg via ORAL

## 2018-02-28 MED ORDER — SODIUM CHLORIDE 0.9 % IV SOLN
1.0000 g | Freq: Once | INTRAVENOUS | Status: AC
  Administered 2018-02-28: 1 g via INTRAVENOUS
  Filled 2018-02-28: qty 10

## 2018-02-28 MED ORDER — IOPAMIDOL (ISOVUE-300) INJECTION 61%
100.0000 mL | Freq: Once | INTRAVENOUS | Status: AC | PRN
  Administered 2018-02-28: 100 mL via INTRAVENOUS
  Filled 2018-02-28: qty 100

## 2018-02-28 MED ORDER — CIPROFLOXACIN HCL 500 MG PO TABS: 500.0000 mg | ORAL_TABLET | Freq: Two times a day (BID) | ORAL | 0 refills | Status: AC

## 2018-02-28 MED ORDER — FENTANYL CITRATE (PF) 100 MCG/2ML IJ SOLN
50.0000 ug | Freq: Once | INTRAMUSCULAR | Status: DC
  Filled 2018-02-28: qty 2

## 2018-02-28 MED ORDER — ONDANSETRON 4 MG PO TBDP: 4.0000 mg | ORAL_TABLET | Freq: Three times a day (TID) | ORAL | 0 refills | Status: DC | PRN

## 2018-02-28 MED ORDER — SODIUM CHLORIDE 0.9 % IV BOLUS
500.0000 mL | Freq: Once | INTRAVENOUS | Status: AC
  Administered 2018-02-28: 500 mL via INTRAVENOUS

## 2018-02-28 MED ORDER — ONDANSETRON HCL 4 MG/2ML IJ SOLN
4.0000 mg | Freq: Once | INTRAMUSCULAR | Status: DC
  Filled 2018-02-28: qty 2

## 2018-02-28 MED ORDER — SODIUM CHLORIDE 0.9 % IV BOLUS
1000.0000 mL | Freq: Once | INTRAVENOUS | Status: AC
  Administered 2018-02-28: 1000 mL via INTRAVENOUS

## 2018-02-28 NOTE — ED Notes (Signed)
Patient transported to CT 

## 2018-02-28 NOTE — ED Notes (Signed)
Pt verbalizes understanding of discharge instructions.

## 2018-02-28 NOTE — ED Triage Notes (Signed)
Pt here with c/o fever, nausea that began yesterday and vomiting that began today. Denies diarrhea, appears pale, no distress noted.

## 2018-02-28 NOTE — ED Notes (Signed)
This RN unsuccessful at blood draw x2. IV established, but no blood for specimen collection.

## 2018-02-28 NOTE — Telephone Encounter (Signed)
I returned her call.   She has been in the hospital twice recently with sepsis from UTI.   She said she did not have any symptoms with her UTI with this last hospitalization.    She is c/o having fever 100.2 that's with taking Tylenol.   Also having nausea.   Denies any other symptoms.  Due to her recent hospitalizations with sepsis and UTI I have instructed her to go to the Urgent care center since all the providers at [facility] are booked.   I attempted to call the flow coordinator 3 times without an answer after letting phone ring multiple times.   Per protocol she needs to be seen within 4 hours.   She has agreed to have her daughter take her to the [facility] urgent care center.   I emphasized it's important she be evaluated without delay due to her recent hospitalizations due to UTI with sepsis.   She agreed with the plan. I let her know I would route a note to Dr. Derrel Nip letting her know about her symptoms and referral to urgent care.  I instructed her to call us back if the urgent care does not work out.  Reason for Disposition . [1] Fever > 100.0 F (37.8 C) AND [2] diabetes mellitus or weak immune system (e.g., HIV positive, cancer chemo, splenectomy, chronic steroids)  Answer Assessment - Initial Assessment Questions 1. TEMPERATURE: "What is the most recent temperature?"  "How was it measured?"      100.2 last night.   I'm also having nausea.   I'm also on Tylenol too.   I was just discharged from the hospital with UTI with sepsis.    2. ONSET: "When did the fever start?"      I started chilling 2-3 nights ago.    3. SYMPTOMS: "Do you have any other symptoms besides the fever?"  (e.g., colds, headache, sore throat, earache, cough, rash, diarrhea, vomiting, abdominal pain)     No burning with urination.  I had no symptoms with UTI that put me in the hospital.   I've been in the hospital twice recently.   I'm not feeling steady.   4. CAUSE: If there are no symptoms, ask:  "What do you think is causing the fever?"      UTI 5. CONTACTS: "Does anyone else in the family have an infection?"     Not asked 6. TREATMENT: "What have you done so far to treat this fever?" (e.g., medications)     Tyelnol 7. IMMUNOCOMPROMISE: "Do you have of the following: diabetes, HIV positive, splenectomy, cancer chemotherapy, chronic steroid treatment, transplant patient, etc."     I had a heart attack a long time ago.  No kidney problems.   I had had a mastectomy 2 years ago.   I also have Lupus. 8. PREGNANCY: "Is there any chance you are pregnant?" "When was your last menstrual period?"     Not asked due to age 83. TRAVEL: "Have you traveled out of the country in the last month?" (e.g., travel history, exposures)     No  Protocols used: FEVER-A-AH

## 2018-02-28 NOTE — ED Triage Notes (Signed)
Abdominal pain described as "cramping."

## 2018-02-28 NOTE — ED Notes (Signed)
Lab will add-on urine culture, per [facility].

## 2018-02-28 NOTE — ED Notes (Signed)
Labs done and resulted at [facility] 2 hours ago, pt is hard stick per daughter, will not redraw at this time.

## 2018-02-28 NOTE — ED Triage Notes (Signed)
Has been seen here twice in the past 3 weeks and hospitalized for UTI, then vomiting, however, the fever is new. Vomiting possibly triggered by new medication she started.

## 2018-02-28 NOTE — Telephone Encounter (Signed)
FYI patient seen at [facility].

## 2018-02-28 NOTE — ED Triage Notes (Signed)
Pt here for nausea and "just not feeling good". Recently admitted at [facility] for UTI. Pt concerned she may have another UTI.

## 2018-02-28 NOTE — ED Notes (Signed)
Pt refused fentanyl and zofran at this time.

## 2018-02-28 NOTE — Telephone Encounter (Signed)
Yes notes  given

## 2018-02-28 NOTE — ED Provider Notes (Signed)
HPI  SUBJECTIVE:  Kim Santiago is a 83 y.o. female who presents with fevers T-max 100.8, chills, constant nausea, fatigue, and "not feeling well" for 3 days.  She reports intermittent lower abdominal pain/pressure.  She restarted her Imuran this morning.  Denies any other medication changes. Denies HA, ear pain, sinus pain or pressure, sore throat, cough.  No wheezing, chest pain, shortness of breath.  No chest pressure or heaviness.  Reports thoracic back pain, but had a recent evaluation for this and was found to have a thoracic fracture.  She states that it is not changed.  She reports intermittent, minutes along low abdominal pressure and is better with rest and seems to be worse with walking.  She denies dysuria, urgency, frequency, cloudy odorous urine, hematuria.  No constipation, diarrhea, rash.  She has not had any vomiting at home.  No altered mental status.  no antipyretic in the past 6 to 8 hours. Past medical history of MI, 2 episodes of urosepsis recently, lupus, breast cancer, on immunotherapy, thyroid cancer.  Most recent admission for possible was on 6/5 was discharged on 6/7 with a diagnosis of sepsis, likely viral..  Patient called nurse triage today, was instructed to come here for evaluation.  Past Medical History:  Diagnosis Date  . Arthritis   . Breast cancer ([place]) 2017   left mastectomy done 11/2015  . Breast cancer in female [facility]) 11/18/2015   Left: 3.9 cm tumor, T2, 1/2 sentinel nodes positive for macro metastatic disease, N1, 3 negative nodes in the axillary tail, ER+,PR+, Her 2 neu, low Mammoprint score  . Cancer [facility])    thyroid takes levothyroxine  . Chronic kidney disease    UTI  . Genetic screening March 2017.   Mammoprint of left breast cancer: Low risk for recurrence.   . Hypertension   . hypothyroidism    secondary to thyroidectomy for thyroid ca  . Hypothyroidism   . Lupus ([facility])    subcutaneous  . Menopause 40s   natural, hot flashes and mood  lability now gone, off prempro 7 months  . Myocardial infarction ([place]) 2013  . Osteoporosis    Osteopenia  . Rosacea   . Sjoegren syndrome   . Stress-induced cardiomyopathy September of 2013   EF 35%. Peak troponin was 1.8.    Past Surgical History:  Procedure Laterality Date  . BACK SURGERY    . BREAST BIOPSY Left 10/30/15   positive, done in Dr. Dwyane Luo office  . CARDIAC CATHETERIZATION  05/2012   [facility]. No significant CAD. Ejection fraction of 35% due to stress-induced cardiomyopathy.  . CHOLECYSTECTOMY    . COLONOSCOPY    . DILATION AND CURETTAGE OF UTERUS    . KYPHOSIS SURGERY  Feb 2008   L1, Dr. Mauri Pole  . LUMBAR DISC SURGERY     L4-L5  . MASTECTOMY Left 11/18/2015   positive  . SENTINEL NODE BIOPSY Left 11/18/2015   Procedure: SENTINEL NODE BIOPSY;  Surgeon: Robert Bellow, MD;  Location: [facility] ORS;  Service: General;  Laterality: Left;  . SHOULDER ARTHROSCOPY  2004   Left, Dr. Jefm Bryant  . SIMPLE MASTECTOMY WITH AXILLARY SENTINEL NODE BIOPSY Left 11/18/2015   Procedure: SIMPLE MASTECTOMY;  Surgeon: Robert Bellow, MD;  Location: [facility] ORS;  Service: General;  Laterality: Left;  . SPINE SURGERY     L4-5 diskectomy  . THYROIDECTOMY     Thyroid Cancer  . TONSILLECTOMY    . TUBAL LIGATION      Family History  Problem Relation Age of Onset  . COPD Father   . Cancer Father        esophageal  . Kidney disease Mother   . Heart disease Mother   . Kidney disease Sister   . Cancer Brother 58       colon cancer (both brothers)  . Heart attack Brother 58  . Heart disease Brother   . Breast cancer Neg Hx     Social History   Tobacco Use  . Smoking status: Never Smoker  . Smokeless tobacco: Never Used  Substance Use Topics  . Alcohol use: No  . Drug use: No    No current facility-administered medications for this encounter.   Current Outpatient Medications:  .  aspirin 81 MG tablet, Take 81 mg by mouth daily., Disp: , Rfl:  .  azathioprine (IMURAN) 100 MG  tablet, Take 75 mg by mouth daily., Disp: , Rfl:  .  Calcium Carbonate-Vitamin D (CALCIUM + D) 600-200 MG-UNIT TABS, Take 2 tablets by mouth 2 (two) times daily. , Disp: , Rfl:  .  cholecalciferol (VITAMIN D) 1000 UNITS tablet, Take 2,000 Units by mouth daily. , Disp: , Rfl:  .  diphenhydramine-acetaminophen (TYLENOL PM) 25-500 MG TABS tablet, Take 1 tablet by mouth at bedtime. , Disp: , Rfl:  .  furosemide (LASIX) 20 MG tablet, 1 tablet every other day as needed, Disp: 30 tablet, Rfl: 3 .  letrozole (FEMARA) 2.5 MG tablet, Take 1 tablet by mouth once daily, Disp: 90 tablet, Rfl: 1 .  levothyroxine (SYNTHROID, LEVOTHROID) 88 MCG tablet, Take 1 tablet by mouth daily, Disp: 90 tablet, Rfl: 0 .  metoprolol succinate (TOPROL-XL) 25 MG 24 hr tablet, Take 1 tablet by mouth daily, Disp: 90 tablet, Rfl: 1 .  mirtazapine (REMERON) 7.5 MG tablet, Take 1 tablet by mouth at bedtime (Dose change), Disp: 90 tablet, Rfl: 1 .  omeprazole (PRILOSEC) 20 MG capsule, Take 1 capsule by mouth 2 (two) times daily., Disp: , Rfl:  .  polyethylene glycol (MIRALAX / GLYCOLAX) packet, Take 17 g by mouth daily., Disp: , Rfl:  .  tizanidine (ZANAFLEX) 2 MG capsule, Take 1 capsule (2 mg total) by mouth 3 (three) times daily., Disp: 90 capsule, Rfl: 0 .  traMADol (ULTRAM) 50 MG tablet, Take 1 tablet (50 mg total) by mouth every 8 (eight) hours as needed., Disp: 60 tablet, Rfl: 2  Allergies  Allergen Reactions  . Amoxicillin Rash    Has patient had a PCN reaction causing immediate rash, facial/tongue/throat swelling, SOB or lightheadedness with hypotension: Unknown Has patient had a PCN reaction causing severe rash involving mucus membranes or skin necrosis: Unknown Has patient had a PCN reaction that required hospitalization: Unknown Has patient had a PCN reaction occurring within the last 10 years: Unknown If all of the above answers are "NO", then may proceed with Cephalosporin use. Tolerates ceftriaxone and Ancef  .  Codeine Nausea And Vomiting  . Naprosyn [Naproxen] Swelling  . Orudis [Ketoprofen] Hives  . Plaquenil [Hydroxychloroquine] Hives  . Sulfathiazole Rash     ROS  As noted in HPI.   Physical Exam  BP 137/67 (BP Location: Right Arm)   Pulse 89   Temp 98.9 F (37.2 C) (Oral)   Resp 16   Ht 5\' 4"  (1.626 m)   Wt 136 lb (61.7 kg)   SpO2 99%   BMI 23.34 kg/m   Constitutional: Well developed, well nourished, vomiting. Eyes: PERRL, EOMI, conjunctiva normal bilaterally HENT: Normocephalic, atraumatic,mucus  membranes moist Respiratory: Clear to auscultation bilaterally, no rales, no wheezing, no rhonchi Cardiovascular: Normal rate and rhythm, no murmurs, no gallops, no rubs GI: Soft, nondistended, normal bowel sounds, nontender, no rebound, no guarding Back: no CVAT skin: No rash, skin intact Musculoskeletal: No edema, no tenderness, no deformities Neurologic: Alert & oriented x 3, CN II-XII grossly intact, no motor deficits, sensation grossly intact Psychiatric: Speech and behavior appropriate   ED Course   Medications  ondansetron (ZOFRAN-ODT) disintegrating tablet 4 mg (4 mg Oral Given 02/28/18 1136)  ondansetron (ZOFRAN-ODT) disintegrating tablet 4 mg (4 mg Oral Given 02/28/18 1136)    Orders Placed This Encounter  Procedures  . Urine culture    Standing Status:   Standing    Number of Occurrences:   1  . DG Chest 2 View    Standing Status:   Standing    Number of Occurrences:   1    Order Specific Question:   Reason for Exam (SYMPTOM  OR DIAGNOSIS REQUIRED)    Answer:   malaise, fever, immunosuppression. r/o PNA  . Urinalysis, Complete w Microscopic    Standing Status:   Standing    Number of Occurrences:   1  . CBC with Differential    Standing Status:   Standing    Number of Occurrences:   1  . Comprehensive metabolic panel    Standing Status:   Standing    Number of Occurrences:   1  . ED EKG    malaise    Standing Status:   Standing    Number of  Occurrences:   1    Order Specific Question:   Reason for Exam    Answer:   Other (See Comments)  . EKG 12-Lead    Standing Status:   Standing    Number of Occurrences:   1   Results for orders placed or performed during the hospital encounter of 02/28/18 (from the past 24 hour(s))  Urinalysis, Complete w Microscopic     Status: Abnormal   Collection Time: 02/28/18 10:35 AM  Result Value Ref Range   Color, Urine STRAW (A) YELLOW   APPearance CLEAR CLEAR   Specific Gravity, Urine <1.005 (L) 1.005 - 1.030   pH 7.0 5.0 - 8.0   Glucose, UA NEGATIVE NEGATIVE mg/dL   Hgb urine dipstick NEGATIVE NEGATIVE   Bilirubin Urine NEGATIVE NEGATIVE   Ketones, ur NEGATIVE NEGATIVE mg/dL   Protein, ur NEGATIVE NEGATIVE mg/dL   Nitrite NEGATIVE NEGATIVE   Leukocytes, UA NEGATIVE NEGATIVE   Squamous Epithelial / LPF 0-5 0 - 5   WBC, UA 0-5 0 - 5 WBC/hpf   RBC / HPF NONE SEEN 0 - 5 RBC/hpf   Bacteria, UA NONE SEEN NONE SEEN  CBC with Differential     Status: Abnormal   Collection Time: 02/28/18 11:41 AM  Result Value Ref Range   WBC 3.9 3.6 - 11.0 K/uL   RBC 3.50 (L) 3.80 - 5.20 MIL/uL   Hemoglobin 12.0 12.0 - 16.0 g/dL   HCT 34.6 (L) 35.0 - 47.0 %   MCV 98.7 80.0 - 100.0 fL   MCH 34.4 (H) 26.0 - 34.0 pg   MCHC 34.8 32.0 - 36.0 g/dL   RDW 16.0 (H) 11.5 - 14.5 %   Platelets 232 150 - 440 K/uL   Neutrophils Relative % 61 %   Neutro Abs 2.4 1.4 - 6.5 K/uL   Lymphocytes Relative 37 %   Lymphs Abs 1.4 1.0 - 3.6  K/uL   Monocytes Relative 1 %   Monocytes Absolute 0.0 (L) 0.2 - 0.9 K/uL   Eosinophils Relative 1 %   Eosinophils Absolute 0.0 0 - 0.7 K/uL   Basophils Relative 0 %   Basophils Absolute 0.0 0 - 0.1 K/uL   No results found.  ED Clinical Impression  Nausea  Bilious vomiting with nausea  Fever, unspecified fever cause   ED Assessment/Plan   Outside and ER records reviewed.  As noted in HPI. EKG: Normal sinus rhythm, rate 90.  Normal axis, normal intervals.  No hypertrophy.   No ST-T wave changes consistent with ischemia.  UA negative for UTI.  Patient started having multiple episodes of bilious emesis and abdominal pain while here in the department.  She was given 8 mg of Zofran.  She had persistent emesis despite the 8 mg of Zofran.  CMP, CBC drawn.  Pending.  Chest x-ray not done as patient had persistent vomiting.  Transferring to the ED for more comprehensive work-up, IV fluids and nausea control.  Feel that she is stable to go by private vehicle.  Discussed labs, MDM,  Rationale for transfer with patient and family member.  They agree with plan.  Meds ordered this encounter  Medications  . ondansetron (ZOFRAN-ODT) disintegrating tablet 4 mg  . ondansetron (ZOFRAN-ODT) disintegrating tablet 4 mg    *This clinic note was created using [employer]. Therefore, there may be occasional mistakes despite careful proofreading.  ?   Melynda Ripple, MD 03/02/18 1529

## 2018-02-28 NOTE — Telephone Encounter (Signed)
I cannot see records from Urgent Care .  Can you?

## 2018-02-28 NOTE — ED Provider Notes (Signed)
[facility] Emergency Department Provider Note  ____________________________________________  Time seen: Approximately 2:23 PM  I have reviewed the triage vital signs and the nursing notes.   HISTORY  Chief Complaint Emesis and Abdominal Pain    HPI Kim Santiago is a 83 y.o. female s/p remote cholecystectomy, BTL, remote breast cancer status post left mastectomy, cutaneous lupus, recurrent UTI, presenting with general malaise, fever, epigastric and right upper quadrant pain, and nausea and vomiting.  The patient states that 2 weeks ago she was hospitalized for urinary tract infection, discharged for 1 day, then rehospitalized for 2 additional days for nausea and vomiting.  At that time, she had been started on new medication for cutaneous lupus, which was discontinued.  Since then, she has been having decreased energy and generally "not feeling good."  Over the last 3 days, the patient has been having fevers up to 100 point 8 in the evening times.  Today, she went to urgent care, and began to have multiple episodes of nausea and vomiting while there.  I have reviewed the patient's chart, including the note from the physician who saw the patient as well as her laboratory studies, which showed a normal UA.  She describes a crampy pain in the epigastrium and right upper quadrant.  She had a normal bowel movement this morning.  She denies any sick contacts or other changes in her medications.  Past Medical History:  Diagnosis Date  . Arthritis   . Breast cancer ([place]) 2017   left mastectomy done 11/2015  . Breast cancer in female [facility]) 11/18/2015   Left: 3.9 cm tumor, T2, 1/2 sentinel nodes positive for macro metastatic disease, N1, 3 negative nodes in the axillary tail, ER+,PR+, Her 2 neu, low Mammoprint score  . Cancer [facility])    thyroid takes levothyroxine  . Chronic kidney disease    UTI  . Genetic screening March 2017.   Mammoprint of left breast cancer: Low risk  for recurrence.   . Hypertension   . hypothyroidism    secondary to thyroidectomy for thyroid ca  . Hypothyroidism   . Lupus ([facility])    subcutaneous  . Menopause 40s   natural, hot flashes and mood lability now gone, off prempro 7 months  . Myocardial infarction ([place]) 2013  . Osteoporosis    Osteopenia  . Rosacea   . Sjoegren syndrome   . Stress-induced cardiomyopathy September of 2013   EF 35%. Peak troponin was 1.8.    Patient Active Problem List   Diagnosis Date Noted  . Edema due to hypoalbuminemia 02/20/2018  . Hospital discharge follow-up 02/20/2018  . History of vertebral fracture 02/18/2018  . Sepsis ([place]) 02/09/2018  . UTI (urinary tract infection) 02/06/2018  . GERD (gastroesophageal reflux disease) 10/31/2017  . Hand joint pain 06/13/2017  . Vaginal atrophy 05/11/2017  . Lymph node enlargement 03/17/2017  . Venous insufficiency of both lower extremities 01/10/2017  . Discoid lupus 06/26/2016  . TMJ syndrome 03/31/2016  . Vertigo 03/12/2016  . Hearing loss 03/12/2016  . Osteopenia 12/05/2015  . Malignant neoplasm of left female breast ([place]) 11/26/2015  . Breast cancer, female, left 11/05/2015  . Advanced directives, counseling/discussion 09/18/2015  . History of heart attack 06/12/2014  . Hx of thyroid cancer 06/12/2014  . Adenomatous polyp 06/12/2014  . Major depressive disorder, recurrent episode, moderate ([place]) 05/29/2014  . Grief 04/08/2014  . Sjogren's syndrome ([place]) 03/09/2014  . Insomnia due to stress 03/06/2014  . Cervicalgia of occipito-atlanto-axial region 09/22/2013  .  Hyperlipidemia 12/28/2012  . Medicare annual wellness visit, subsequent 12/28/2012  . Iatrogenic hypothyroidism 10/02/2011    Past Surgical History:  Procedure Laterality Date  . BACK SURGERY    . BREAST BIOPSY Left 10/30/15   positive, done in Dr. Dwyane Luo office  . CARDIAC CATHETERIZATION  05/2012   [facility]. No significant CAD. Ejection fraction of 35% due to stress-induced  cardiomyopathy.  . CHOLECYSTECTOMY    . COLONOSCOPY    . DILATION AND CURETTAGE OF UTERUS    . KYPHOSIS SURGERY  Feb 2008   L1, Dr. Mauri Pole  . LUMBAR DISC SURGERY     L4-L5  . MASTECTOMY Left 11/18/2015   positive  . SENTINEL NODE BIOPSY Left 11/18/2015   Procedure: SENTINEL NODE BIOPSY;  Surgeon: Robert Bellow, MD;  Location: [facility] ORS;  Service: General;  Laterality: Left;  . SHOULDER ARTHROSCOPY  2004   Left, Dr. Jefm Bryant  . SIMPLE MASTECTOMY WITH AXILLARY SENTINEL NODE BIOPSY Left 11/18/2015   Procedure: SIMPLE MASTECTOMY;  Surgeon: Robert Bellow, MD;  Location: [facility] ORS;  Service: General;  Laterality: Left;  . SPINE SURGERY     L4-5 diskectomy  . THYROIDECTOMY     Thyroid Cancer  . TONSILLECTOMY    . TUBAL LIGATION      Current Outpatient Rx  . Order #: [id number] Class: Historical Med  . Order #: [id number] Class: Historical Med  . Order #: [id number] Class: Historical Med  . Order #: [id number] Class: Historical Med  . Order #: [id number] Class: Print  . Order #: [id number] Class: Historical Med  . Order #: [id number] Class: Normal  . Order #: [id number] Class: Normal  . Order #: [id number] Class: Normal  . Order #: [id number] Class: Normal  . Order #: [id number] Class: Normal  . Order #: [id number] Class: Historical Med  . Order #: [id number] Class: Print  . Order #: [id number] Class: Historical Med  . Order #: [id number] Class: Normal  . Order #: [id number] Class: Print    Allergies Amoxicillin; Codeine; Naprosyn [naproxen]; Orudis [ketoprofen]; Plaquenil [hydroxychloroquine]; and Sulfathiazole  Family History  Problem Relation Age of Onset  . COPD Father   . Cancer Father        esophageal  . Kidney disease Mother   . Heart disease Mother   . Kidney disease Sister   . Cancer Brother 52       colon cancer (both brothers)  . Heart attack Brother 38  . Heart disease Brother   . Breast cancer Neg Hx     Social History Social History   Tobacco Use  . Smoking status: Never  Smoker  . Smokeless tobacco: Never Used  Substance Use Topics  . Alcohol use: No  . Drug use: No    Review of Systems Constitutional: No fever/chills.  No lightheadedness or syncope. Eyes: No visual changes.  No eye discharge. ENT: No sore throat. No congestion or rhinorrhea.  Positive mildly hoarse voice. Cardiovascular: Denies chest pain. Denies palpitations. Respiratory: Denies shortness of breath.  No cough. Gastrointestinal: Positive epigastric and right upper quadrant abdominal pain.  Positive nausea, positive vomiting.  No diarrhea.  No constipation. Genitourinary: Negative for dysuria.  Frequency.  No hematuria. Musculoskeletal: Negative for back pain.  No flank pain. Skin: Negative for rash. Neurological: Negative for headaches. No focal numbness, tingling or weakness.     ____________________________________________   PHYSICAL EXAM:  VITAL SIGNS: ED Triage Vitals  Enc Vitals Group     BP 02/28/18 1303 121/65     Pulse Rate 02/28/18 1303 (!) 102     Resp --  Temp 02/28/18 1303 98.6 F (37 C)     Temp Source 02/28/18 1303 Oral     SpO2 02/28/18 1303 100 %     Weight 02/28/18 1304 136 lb (61.7 kg)     Height 02/28/18 1304 5\' 4"  (1.626 m)     Head Circumference --      Peak Flow --      Pain Score 02/28/18 1317 2     Pain Loc --      Pain Edu? --      Excl. in [place]? --     Constitutional: Alert and oriented. Answers questions appropriately.  Mildly uncomfortable appearing with dry mucous membranes. Eyes: Conjunctivae are normal.  EOMI. No scleral icterus.  No eye discharge. Head: Atraumatic. Nose: No congestion/rhinnorhea. Mouth/Throat: Mucous membranes are dry.  The posterior pharynx is without erythema, tonsillar swelling or exudate.  The posterior palate is symmetric and the uvula is midline.  Neck: No stridor.  Supple.  No JVD.  No meningismus. Cardiovascular: Normal rate, regular rhythm. No murmurs, rubs or gallops.  Respiratory: Normal respiratory  effort.  No accessory muscle use or retractions. Lungs CTAB.  No wheezes, rales or ronchi. Gastrointestinal: Soft, and nondistended.  Tender to palpation in the right upper quadrant only; negative Murphy sign.  No guarding or rebound.  No peritoneal signs. Musculoskeletal: Positive bilateral symmetric LE edema that is pitting around the ankles to the distal tibial shaft. No ttp in the calves or palpable cords.  Negative Homan's sign. Neurologic:  A&Ox3.  Speech is clear.  Face and smile are symmetric.  EOMI.  Moves all extremities well. Skin:  Skin is warm, dry and intact. No rash noted. Psychiatric: Mood and affect are normal. Speech and behavior are normal.  Normal judgement.  ____________________________________________   LABS (all labs ordered are listed, but only abnormal results are displayed)  Labs Reviewed  URINALYSIS, COMPLETE (UACMP) WITH MICROSCOPIC - Abnormal; Notable for the following components:      Result Value   Color, Urine YELLOW (*)    APPearance CLEAR (*)    Leukocytes, UA SMALL (*)    Bacteria, UA FEW (*)    Non Squamous Epithelial 0-5 (*)    All other components within normal limits  TSH - Abnormal; Notable for the following components:   TSH 0.093 (*)    All other components within normal limits  URINE CULTURE  LIPASE, BLOOD  TROPONIN I   ____________________________________________  EKG  ED ECG REPORT I, Eula Listen, the attending physician, personally viewed and interpreted this ECG.   Date: 02/28/2018  EKG Time: 1542  Rate: 105  Rhythm: sinus tachycardia  Axis: normal  Intervals:first-degree A-V block   ST&T Change: No STEMI  ____________________________________________  RADIOLOGY  Ct Abdomen Pelvis W Contrast  Result Date: 02/28/2018 CLINICAL DATA:  Fever, nausea, and vomiting. EXAM: CT ABDOMEN AND PELVIS WITH CONTRAST TECHNIQUE: Multidetector CT imaging of the abdomen and pelvis was performed using the standard protocol following  bolus administration of intravenous contrast. CONTRAST:  115mL ISOVUE-300 IOPAMIDOL (ISOVUE-300) INJECTION 61% COMPARISON:  PET-CT 12/23/2016 FINDINGS: Lower chest: Minimal atelectasis in the lung bases. No pleural effusion. Normal heart size. Status post left mastectomy. Hepatobiliary: No focal liver abnormality is seen. Status post cholecystectomy. No biliary dilatation. Pancreas: Unremarkable. Spleen: Unremarkable. Adrenals/Urinary Tract: Unremarkable adrenal glands. Subcentimeter low-density lesions in both kidneys, too small to fully characterize. No evidence of renal calculi or hydronephrosis. Unremarkable bladder. Stomach/Bowel: There is a small sliding hiatal hernia. There are multiple  fluid-filled loops of small bowel with mildly prominent mucosal enhancement. No bowel dilatation is seen to indicate obstruction. The appendix may be completely collapsed posterior to the cecum, and there is no evidence of acute appendicitis. Vascular/Lymphatic: Abdominal aortic atherosclerosis without aneurysm. No enlarged lymph nodes. Reproductive: Atrophic uterus and ovaries.  No adnexal mass. Other: No intraperitoneal free fluid or free air. No abdominal wall hernia. Musculoskeletal: Prior augmentation of an L1 compression fracture. No suspicious osseous lesion. IMPRESSION: 1. Scattered fluid-filled small bowel loops with mildly prominent enhancement which may reflect enteritis. No bowel obstruction. 2.  Aortic Atherosclerosis (ICD10-I70.0). Electronically Signed   By: Logan Bores M.D.   On: 02/28/2018 15:18    ____________________________________________   PROCEDURES  Procedure(s) performed: None  Procedures  Critical Care performed: No ____________________________________________   INITIAL IMPRESSION / ASSESSMENT AND PLAN / ED COURSE  Pertinent labs & imaging results that were available during my care of the patient were reviewed by me and considered in my medical decision making (see chart for  details).  83 y.o. F, s/p remote chole, presenting with RUQ pain, n/v, general malaise and fever to 100.8 yesterday.  The patient has some mild tachycardia in triage but it has resolved on my exam.  She is afebrile here.  Overall, the patient's laboratory studies from urgent care show normal white count, normal electrolytes, and a mildly elevated glucose.  Her urinalysis does not show evidence of UTI but we will do a full microscopic urinalysis here given the patient's history of recurrent UTI.  In addition, I will evaluate the patient for an intra-abdominal pathology including pancreatitis.  There is no evidence of hepatitis.  Obstruction would be unlikely given the patient's normal bowel movements.  A viral or foodborne GI illness is possible although the patient is describing symptoms that have been going on for 2 weeks which makes this less likely.  A side effect to medication or food allergy is also possible.  Systemic illness from the patient's cutaneous lupus is considered.  Plan reevaluation for final disposition.  ----------------------------------------- 5:28 PM on 02/28/2018 -----------------------------------------  The patient's laboratory studies do show some bacteriuria with elevated white blood cells and leukocytes here.  I will plan to treat the patient with a dose of Rocephin here, and then discharge her home with ciprofloxacin for 7 days.  She will also receive 1500 cc of intravenous fluids to help with her UTI.  A urine culture has been sent and the patient has been instructed to have her primary care physician followed up.  The patient's troponin is reassuring, and her lipase is negative.  She does have a TSH today of 0.093.  She reports to me that she has had 2 prior thyroid surgeries, including thyroid cancer, and her 71s and 31s.  Since then, she has been on oral supplementation and has not had any changes in her dosing; she has been stable on that.  I have asked her to follow-up  with Dr. Derrel Nip for that.  The patient's CT abdomen does show some scattered fluid-filled small bowel loops, which is likely consistent with the patient's vomiting syndrome.  There is no evidence of obstruction.  There are no other acute findings in the abdomen.  At this time, the patient is drinking clear liquids without any difficulty.  She will receive her IV antibiotics and be discharged home.  Follow-up instructions as well as return precautions were discussed.  ____________________________________________  FINAL CLINICAL IMPRESSION(S) / ED DIAGNOSES  Final diagnoses:  Acute cystitis  without hematuria  Abnormal serum thyroid stimulating hormone (TSH) level  Non-intractable vomiting with nausea, unspecified vomiting type         NEW MEDICATIONS STARTED DURING THIS VISIT:  New Prescriptions   CIPROFLOXACIN (CIPRO) 500 MG TABLET    Take 1 tablet (500 mg total) by mouth 2 (two) times daily for 7 days.   ONDANSETRON (ZOFRAN ODT) 4 MG DISINTEGRATING TABLET    Take 1 tablet (4 mg total) by mouth every 8 (eight) hours as needed for nausea or vomiting.      Eula Listen, MD 02/28/18 1731

## 2018-02-28 NOTE — Discharge Instructions (Addendum)
Please drink plenty of fluid to stay well-hydrated.  Please take the entire course of antibiotics, even if you are feeling better.  Zofran is for nausea and vomiting.  Please make a follow-up appoint with your primary care physician for reevaluation, and ask your primary care physician to follow-up the results of your urine culture that was performed today.  Your doctor will also want to reevaluate your thyroid testing.  Return to the emergency department if you develop severe pain, lightheadedness or fainting, abdominal pain, inability to keep down fluids, or any other symptoms concerning to you.

## 2018-03-02 LAB — URINE CULTURE

## 2018-03-09 ENCOUNTER — Encounter: Payer: Self-pay | Admitting: Internal Medicine

## 2018-03-09 ENCOUNTER — Ambulatory Visit (INDEPENDENT_AMBULATORY_CARE_PROVIDER_SITE_OTHER): Payer: PPO | Admitting: Internal Medicine

## 2018-03-09 VITALS — BP 142/74 | HR 88 | Temp 98.3°F | Resp 15 | Ht 64.0 in | Wt 140.0 lb

## 2018-03-09 DIAGNOSIS — R300 Dysuria: Secondary | ICD-10-CM | POA: Diagnosis not present

## 2018-03-09 DIAGNOSIS — E032 Hypothyroidism due to medicaments and other exogenous substances: Secondary | ICD-10-CM | POA: Diagnosis not present

## 2018-03-09 DIAGNOSIS — L930 Discoid lupus erythematosus: Secondary | ICD-10-CM | POA: Diagnosis not present

## 2018-03-09 DIAGNOSIS — E8809 Other disorders of plasma-protein metabolism, not elsewhere classified: Secondary | ICD-10-CM

## 2018-03-09 DIAGNOSIS — R609 Edema, unspecified: Secondary | ICD-10-CM

## 2018-03-09 DIAGNOSIS — F331 Major depressive disorder, recurrent, moderate: Secondary | ICD-10-CM

## 2018-03-09 MED ORDER — METOLAZONE 2.5 MG PO TABS: 2.5000 mg | ORAL_TABLET | Freq: Every day | ORAL | 1 refills | Status: DC

## 2018-03-09 NOTE — Patient Instructions (Addendum)
If you are willing to retry the Imuran, I would take it AFTER LUNCH  And pre treat with zofran 30 minutes before and 3 -4 hours after your Imuran dose.   FOR THE SWELLING,  YOU NEED TO TAKE THE NEW DIURETIC (METOLAZONE) 30 MINUTES BEFORE YOUR FUROSEMIDE TO MAKE IT WORK.  DO NOT USE BOTH FOR MORE THAN 2-3 AYS IN A ROW    FOR YOUR FATIGUE:  RECHECK THYROID IN 2 WEEKS  CONSIDER INCREASING THE REMERON TO 15 MG AT BEDTIME

## 2018-03-09 NOTE — Progress Notes (Signed)
Subjective:  Patient ID: Kim Santiago, female    DOB: Oct 16, 1934  Age: 83 y.o. MRN: [id number]  CC: The primary encounter diagnosis was Dysuria. Diagnoses of Iatrogenic hypothyroidism, Discoid lupus, Edema due to hypoalbuminemia, and Major depressive disorder, recurrent episode, moderate ([place]) were also pertinent to this visit.  HPI Kim Santiago presents for ER follow up after developing  sudden onset of fever and nausea  On June 24 .  She was treated presumptively for a UTI  With a 7 day course of ciprofloxacin.  Review of ER records has been done today and her Urine culture from June 24 grew multiple unnamed species ("recollection recommended") . UA showed few bacteria, small leukocytes , 11-20 WBC,  No RBCs,.  CBC was normal , lipase and troponin was normal.    The previous episode of "sepsis" resulting in hospitalization on June 2 was attributed to a Klebsiella UTI,  But when she was admitted on June 5 the repeat urine was negative   She believes that her recurrent episodes are adverse reactions to Imuran because the symptoms of nausea have occurred several hours after each dose . Last dose was Monday .  She has tried multiple other treatments for discoid lupus (managed by Karle Barr , MD).  She has not taken any Imuran  In 48 hours , and has not tried using zofran  First.   She has not had any nausea since stopping the Imuran, but her appetite remains poor and she has low energy,. She has noted significant fluid retention with lower extremity edema, despite taking furosemide daily . Comparison of weights using only this office scales was done and her weight is unchanged from June 12   Lab Results  Component Value Date   WBC 3.9 02/28/2018   HGB 12.0 02/28/2018   HCT 34.6 (L) 02/28/2018   MCV 98.7 02/28/2018   PLT 232 02/28/2018     Outpatient Medications Prior to Visit  Medication Sig Dispense Refill  . aspirin 81 MG tablet Take 81 mg by mouth daily.    . Calcium  Carbonate-Vitamin D (CALCIUM + D) 600-200 MG-UNIT TABS Take 2 tablets by mouth 2 (two) times daily.     . cholecalciferol (VITAMIN D) 1000 UNITS tablet Take 2,000 Units by mouth daily.     . diphenhydramine-acetaminophen (TYLENOL PM) 25-500 MG TABS tablet Take 1 tablet by mouth at bedtime.     . furosemide (LASIX) 20 MG tablet 1 tablet every other day as needed 30 tablet 3  . letrozole (FEMARA) 2.5 MG tablet Take 1 tablet by mouth once daily 90 tablet 1  . levothyroxine (SYNTHROID, LEVOTHROID) 88 MCG tablet Take 1 tablet by mouth daily 90 tablet 0  . metoprolol succinate (TOPROL-XL) 25 MG 24 hr tablet Take 1 tablet by mouth daily 90 tablet 1  . mirtazapine (REMERON) 7.5 MG tablet Take 1 tablet by mouth at bedtime (Dose change) 90 tablet 1  . omeprazole (PRILOSEC) 20 MG capsule Take 1 capsule by mouth 2 (two) times daily.    . ondansetron (ZOFRAN ODT) 4 MG disintegrating tablet Take 1 tablet (4 mg total) by mouth every 8 (eight) hours as needed for nausea or vomiting. 20 tablet 0  . polyethylene glycol (MIRALAX / GLYCOLAX) packet Take 17 g by mouth daily.    . tizanidine (ZANAFLEX) 2 MG capsule Take 1 capsule (2 mg total) by mouth 3 (three) times daily. 90 capsule 0  . traMADol (ULTRAM) 50 MG tablet Take 1 tablet (  50 mg total) by mouth every 8 (eight) hours as needed. 60 tablet 2  . azathioprine (IMURAN) 100 MG tablet Take 75 mg by mouth daily.     No facility-administered medications prior to visit.     Review of Systems;  Patient denies headache, fevers, malaise, unintentional weight loss, skin rash, eye pain, sinus congestion and sinus pain, sore throat, dysphagia,  hemoptysis , cough, dyspnea, wheezing, chest pain, palpitations, orthopnea, edema, abdominal pain,  melena, diarrhea, constipation, flank pain, dysuria, hematuria, urinary  Frequency, nocturia, numbness, tingling, seizures,  Focal weakness, Loss of consciousness,  Tremor, insomnia, depression, anxiety, and suicidal ideation.       Objective:  BP (!) 142/74 (BP Location: Right Arm, Patient Position: Sitting, Cuff Size: Normal)   Pulse 88   Temp 98.3 F (36.8 C) (Oral)   Resp 15   Ht 5\' 4"  (1.626 m)   Wt 140 lb (63.5 kg)   SpO2 99%   BMI 24.03 kg/m   BP Readings from Last 3 Encounters:  03/09/18 (!) 142/74  02/28/18 132/72  02/28/18 137/67    Wt Readings from Last 3 Encounters:  03/09/18 140 lb (63.5 kg)  02/28/18 136 lb (61.7 kg)  02/28/18 136 lb (61.7 kg)    General appearance: alert, cooperative and appears stated age Ears: normal TM's and external ear canals both ears Throat: lips, mucosa, and tongue normal; teeth and gums normal Neck: no adenopathy, no carotid bruit, supple, symmetrical, trachea midline and thyroid not enlarged, symmetric, no tenderness/mass/nodules Back: symmetric, no curvature. ROM normal. No CVA tenderness. Lungs: clear to auscultation bilaterally Heart: regular rate and rhythm, S1, S2 normal, no murmur, click, rub or gallop Abdomen: soft, non-tender; bowel sounds normal; no masses,  no organomegaly Pulses: 2+ and symmetric Ext:  1+ pitting edema to mid tibia bilaterally.  Lymph nodes: Cervical, supraclavicular, and axillary nodes normal.  No results found for: HGBA1C  Lab Results  Component Value Date   CREATININE 0.75 02/28/2018   CREATININE 0.73 02/23/2018   CREATININE 0.65 02/10/2018    Lab Results  Component Value Date   WBC 3.9 02/28/2018   HGB 12.0 02/28/2018   HCT 34.6 (L) 02/28/2018   PLT 232 02/28/2018   GLUCOSE 133 (H) 02/28/2018   CHOL 170 06/08/2016   TRIG 86.0 06/08/2016   HDL 44.20 06/08/2016   LDLDIRECT 117.6 12/27/2012   LDLCALC 108 (H) 06/08/2016   ALT 7 (L) 02/28/2018   AST 28 02/28/2018   NA 137 02/28/2018   K 3.9 02/28/2018   CL 100 (L) 02/28/2018   CREATININE 0.75 02/28/2018   BUN 13 02/28/2018   CO2 24 02/28/2018   TSH 0.093 (L) 02/28/2018   INR 1.07 02/09/2018    Ct Abdomen Pelvis W Contrast  Result Date:  02/28/2018 CLINICAL DATA:  Fever, nausea, and vomiting. EXAM: CT ABDOMEN AND PELVIS WITH CONTRAST TECHNIQUE: Multidetector CT imaging of the abdomen and pelvis was performed using the standard protocol following bolus administration of intravenous contrast. CONTRAST:  158mL ISOVUE-300 IOPAMIDOL (ISOVUE-300) INJECTION 61% COMPARISON:  PET-CT 12/23/2016 FINDINGS: Lower chest: Minimal atelectasis in the lung bases. No pleural effusion. Normal heart size. Status post left mastectomy. Hepatobiliary: No focal liver abnormality is seen. Status post cholecystectomy. No biliary dilatation. Pancreas: Unremarkable. Spleen: Unremarkable. Adrenals/Urinary Tract: Unremarkable adrenal glands. Subcentimeter low-density lesions in both kidneys, too small to fully characterize. No evidence of renal calculi or hydronephrosis. Unremarkable bladder. Stomach/Bowel: There is a small sliding hiatal hernia. There are multiple fluid-filled loops of small bowel  with mildly prominent mucosal enhancement. No bowel dilatation is seen to indicate obstruction. The appendix may be completely collapsed posterior to the cecum, and there is no evidence of acute appendicitis. Vascular/Lymphatic: Abdominal aortic atherosclerosis without aneurysm. No enlarged lymph nodes. Reproductive: Atrophic uterus and ovaries.  No adnexal mass. Other: No intraperitoneal free fluid or free air. No abdominal wall hernia. Musculoskeletal: Prior augmentation of an L1 compression fracture. No suspicious osseous lesion. IMPRESSION: 1. Scattered fluid-filled small bowel loops with mildly prominent enhancement which may reflect enteritis. No bowel obstruction. 2.  Aortic Atherosclerosis (ICD10-I70.0). Electronically Signed   By: Logan Bores M.D.   On: 02/28/2018 15:18    Assessment & Plan:   Problem List Items Addressed This Visit    Iatrogenic hypothyroidism    During her last ER visit TSH was suppressed, but had been WNL 5 days prior.  Both levels were checked  during acute illness events.  No medication dose changes were made, but she was asked to return in 3 weeks for a tsh   Lab Results  Component Value Date   TSH 0.093 (L) 02/28/2018        Relevant Orders   TSH   Major depressive disorder, recurrent episode, moderate ([place])    Secondary to loss of husband,  Health issues.  Resumed remeron in mid June at 7.5 mg,  encouraged to increase dose to 15 mg qhs       Discoid lupus    currently managed with Imuran after multiple failed trials (placquenil and Methotrexate) . She is apprehensive about resuming Imuran but is willing to try taking it after lunch and pre treating wit hzofran       Edema due to hypoalbuminemia    Aggravated anorexia and decreased oral intake, and  by recurrent aggressive IV hydration during ER visits and hospitalizations (2) in June/July .  Adding metolazone prior to furosemide       Other Visit Diagnoses    Dysuria    -  Primary   Relevant Orders   Urinalysis, Routine w reflex microscopic   Urine Culture     A total of 40 minutes was spent with patient more than half of which was spent in revieweing the ER evaluation with patient , along with priori hospitalizatiosn, counseling patient on the above mentioned issues , reviewing and explaining recent labs and imaging studies done, and coordination of care.  I have discontinued Shalisa R. Widdowson's azathioprine. I am also having her start on metolazone. Additionally, I am having her maintain her cholecalciferol, Calcium Carbonate-Vitamin D, aspirin, polyethylene glycol, diphenhydramine-acetaminophen, mirtazapine, metoprolol succinate, letrozole, levothyroxine, omeprazole, tizanidine, traMADol, furosemide, and ondansetron.  Meds ordered this encounter  Medications  . metolazone (ZAROXOLYN) 2.5 MG tablet    Sig: Take 1 tablet (2.5 mg total) by mouth daily. 30 minutes before furosemide dose    Dispense:  30 tablet    Refill:  1    Medications Discontinued During  This Encounter  Medication Reason  . azathioprine (IMURAN) 100 MG tablet Allergic reaction    Follow-up: Return in about 3 months (around 06/09/2018).   [name], MD

## 2018-03-12 NOTE — Assessment & Plan Note (Addendum)
currently managed with Imuran after multiple failed trials (placquenil and Methotrexate) . She is apprehensive about resuming Imuran but is willing to try taking it after lunch and pre treating wit hzofran

## 2018-03-12 NOTE — Assessment & Plan Note (Signed)
During her last ER visit TSH was suppressed, but had been WNL 5 days prior.  Both levels were checked during acute illness events.  No medication dose changes were made, but she was asked to return in 3 weeks for a tsh   Lab Results  Component Value Date   TSH 0.093 (L) 02/28/2018

## 2018-03-12 NOTE — Assessment & Plan Note (Signed)
Secondary to loss of husband,  Health issues.  Resumed remeron in mid June at 7.5 mg,  encouraged to increase dose to 15 mg qhs

## 2018-03-12 NOTE — Assessment & Plan Note (Signed)
Aggravated anorexia and decreased oral intake, and  by recurrent aggressive IV hydration during ER visits and hospitalizations (2) in June/July .  Adding metolazone prior to furosemide

## 2018-03-31 ENCOUNTER — Ambulatory Visit (INDEPENDENT_AMBULATORY_CARE_PROVIDER_SITE_OTHER): Payer: PPO

## 2018-03-31 ENCOUNTER — Other Ambulatory Visit (INDEPENDENT_AMBULATORY_CARE_PROVIDER_SITE_OTHER): Payer: PPO

## 2018-03-31 ENCOUNTER — Other Ambulatory Visit: Payer: Self-pay | Admitting: Internal Medicine

## 2018-03-31 VITALS — BP 122/62 | HR 84 | Temp 98.3°F | Resp 14 | Ht 63.75 in | Wt 137.4 lb

## 2018-03-31 DIAGNOSIS — R300 Dysuria: Secondary | ICD-10-CM | POA: Diagnosis not present

## 2018-03-31 DIAGNOSIS — E032 Hypothyroidism due to medicaments and other exogenous substances: Secondary | ICD-10-CM | POA: Diagnosis not present

## 2018-03-31 DIAGNOSIS — Z0000 Encounter for general adult medical examination without abnormal findings: Secondary | ICD-10-CM | POA: Diagnosis not present

## 2018-03-31 DIAGNOSIS — L931 Subacute cutaneous lupus erythematosus: Secondary | ICD-10-CM | POA: Diagnosis not present

## 2018-03-31 LAB — TSH: TSH: 0.47 u[IU]/mL (ref 0.35–4.50)

## 2018-03-31 NOTE — Patient Instructions (Addendum)
Kim Santiago , Thank you for taking time to come for your Medicare Wellness Visit. I appreciate your ongoing commitment to your health goals. Please review the following plan we discussed and let me know if I can assist you in the future.   These are the goals we discussed: Goals    . Increase physical activity     Home dvd video (walking/exercise)    . Maintain healthy diet     Lean proteins and drink plenty of water       This is a list of the screening recommended for you and due dates:  Health Maintenance  Topic Date Due  . Flu Shot  04/07/2018  . Tetanus Vaccine  01/06/2023  . DEXA scan (bone density measurement)  Completed  . Pneumonia vaccines  Completed    Hearing Loss Hearing loss is a partial or total loss of the ability to hear. This can be temporary or permanent, and it can happen in one or both ears. Hearing loss may be referred to as deafness. Medical care is necessary to treat hearing loss properly and to prevent the condition from getting worse. Your hearing may partially or completely come back, depending on what caused your hearing loss and how severe it is. In some cases, hearing loss is permanent. What are the causes? Common causes of hearing loss include:  Too much wax in the ear canal.  Infection of the ear canal or middle ear.  Fluid in the middle ear.  Injury to the ear or surrounding area.  An object stuck in the ear.  Prolonged exposure to loud sounds, such as music.  Less common causes of hearing loss include:  Tumors in the ear.  Viral or bacterial infections, such as meningitis.  A hole in the eardrum (perforated eardrum).  Problems with the hearing nerve that sends signals between the brain and the ear.  Certain medicines.  What are the signs or symptoms? Symptoms of this condition may include:  Difficulty telling the difference between sounds.  Difficulty following a conversation when there is background noise.  Lack of  response to sounds in your environment. This may be most noticeable when you do not respond to startling sounds.  Needing to turn up the volume on the television, radio, etc.  Ringing in the ears.  Dizziness.  Pain in the ears.  How is this diagnosed? This condition is diagnosed based on a physical exam and a hearing test (audiometry). The audiometry test will be performed by a hearing specialist (audiologist). You may also be referred to an ear, nose, and throat (ENT) specialist (otolaryngologist). How is this treated? Treatment for recent onset of hearing loss may include:  Ear wax removal.  Being prescribed medicines to prevent infection (antibiotics).  Being prescribed medicines to reduce inflammation (corticosteroids).  Follow these instructions at home:  If you were prescribed an antibiotic medicine, take it as told by your health care provider. Do not stop taking the antibiotic even if you start to feel better.  Take over-the-counter and prescription medicines only as told by your health care provider.  Avoid loud noises.  Return to your normal activities as told by your health care provider. Ask your health care provider what activities are safe for you.  Keep all follow-up visits as told by your health care provider. This is important. Contact a health care provider if:  You feel dizzy.  You develop new symptoms.  You vomit or feel nauseous.  You have a fever.  Get help right away if:  You develop sudden changes in your vision.  You have severe ear pain.  You have new or increased weakness.  You have a severe headache. This information is not intended to replace advice given to you by your health care provider. Make sure you discuss any questions you have with your health care provider. Document Released: 08/24/2005 Document Revised: 01/30/2016 Document Reviewed: 01/09/2015 Elsevier Interactive Patient Education  2018 [employer].

## 2018-03-31 NOTE — Progress Notes (Signed)
Subjective:   Kim KACZMARCZYK is a 83 y.o. female who presents for Medicare Annual (Subsequent) preventive examination.  Review of Systems:  No ROS.  Medicare Wellness Visit. Additional risk factors are reflected in the social history. Cardiac Risk Factors include: advanced age (>13men, >75 women)     Objective:     Vitals: BP 122/62 (BP Location: Right Arm, Patient Position: Sitting, Cuff Size: Normal)   Pulse 84   Temp 98.3 F (36.8 C) (Oral)   Resp 14   Ht 5' 3.75" (1.619 m)   Wt 137 lb 6.4 oz (62.3 kg)   SpO2 99%   BMI 23.77 kg/m   Body mass index is 23.77 kg/m.  Advanced Directives 03/31/2018 02/09/2018 02/06/2018 12/08/2017 07/14/2017 03/17/2017 12/25/2016  Does Patient Have a Medical Advance Directive? Yes Yes Yes Yes Yes Yes Yes  Type of Paramedic of [place];Living will [place];Living will [place];Living will - - - [place];Living will  Does patient want to make changes to medical advance directive? No - Patient declined No - Patient declined No - Patient declined - - - No - Patient declined  Copy of [place] in Chart? Yes No - copy requested No - copy requested - - - No - copy requested  Would patient like information on creating a medical advance directive? - - - - - - -    Tobacco Social History   Tobacco Use  Smoking Status Never Smoker  Smokeless Tobacco Never Used     Counseling given: Not Answered   Clinical Intake:  Pre-visit preparation completed: Yes  Pain : No/denies pain     Nutritional Status: BMI of 19-24  Normal Diabetes: No  How often do you need to have someone help you when you read instructions, pamphlets, or other written materials from your doctor or pharmacy?: 1 - Never  Interpreter Needed?: No     Past Medical History:  Diagnosis Date  . Arthritis   . Breast cancer ([place]) 2017   left mastectomy done 11/2015  . Breast cancer  in female [facility]) 11/18/2015   Left: 3.9 cm tumor, T2, 1/2 sentinel nodes positive for macro metastatic disease, N1, 3 negative nodes in the axillary tail, ER+,PR+, Her 2 neu, low Mammoprint score  . Cancer [facility])    thyroid takes levothyroxine  . Chronic kidney disease    UTI  . Genetic screening March 2017.   Mammoprint of left breast cancer: Low risk for recurrence.   . Hypertension   . hypothyroidism    secondary to thyroidectomy for thyroid ca  . Hypothyroidism   . Lupus ([facility])    subcutaneous  . Menopause 40s   natural, hot flashes and mood lability now gone, off prempro 7 months  . Myocardial infarction ([place]) 2013  . Osteoporosis    Osteopenia  . Rosacea   . Sjoegren syndrome   . Stress-induced cardiomyopathy September of 2013   EF 35%. Peak troponin was 1.8.   Past Surgical History:  Procedure Laterality Date  . BACK SURGERY    . BREAST BIOPSY Left 10/30/15   positive, done in Dr. Dwyane Luo office  . CARDIAC CATHETERIZATION  05/2012   [facility]. No significant CAD. Ejection fraction of 35% due to stress-induced cardiomyopathy.  . CHOLECYSTECTOMY    . COLONOSCOPY    . DILATION AND CURETTAGE OF UTERUS    . KYPHOSIS SURGERY  Feb 2008   L1, Dr. Mauri Pole  .  LUMBAR DISC SURGERY     L4-L5  . MASTECTOMY Left 11/18/2015   positive  . SENTINEL NODE BIOPSY Left 11/18/2015   Procedure: SENTINEL NODE BIOPSY;  Surgeon: Robert Bellow, MD;  Location: [facility] ORS;  Service: General;  Laterality: Left;  . SHOULDER ARTHROSCOPY  2004   Left, Dr. Jefm Bryant  . SIMPLE MASTECTOMY WITH AXILLARY SENTINEL NODE BIOPSY Left 11/18/2015   Procedure: SIMPLE MASTECTOMY;  Surgeon: Robert Bellow, MD;  Location: [facility] ORS;  Service: General;  Laterality: Left;  . SPINE SURGERY     L4-5 diskectomy  . THYROIDECTOMY     Thyroid Cancer  . TONSILLECTOMY    . TUBAL LIGATION     Family History  Problem Relation Age of Onset  . COPD Father   . Cancer Father        esophageal  . Kidney disease Mother   .  Heart disease Mother   . Kidney disease Sister   . Cancer Brother 41       colon cancer (both brothers)  . Heart attack Brother 36  . Heart disease Brother   . Breast cancer Neg Hx    Social History   Socioeconomic History  . Marital status: Widowed    Spouse name: Not on file  . Number of children: 1  . Years of education: college  . Highest education level: Not on file  Occupational History  . Not on file  Social Needs  . Financial resource strain: Not hard at all  . Food insecurity:    Worry: Never true    Inability: Never true  . Transportation needs:    Medical: Not on file    Non-medical: Not on file  Tobacco Use  . Smoking status: Never Smoker  . Smokeless tobacco: Never Used  Substance and Sexual Activity  . Alcohol use: No  . Drug use: No  . Sexual activity: Never  Lifestyle  . Physical activity:    Days per week: 0 days    Minutes per session: Not on file  . Stress: Not at all  Relationships  . Social connections:    Talks on phone: Not on file    Gets together: Not on file    Attends religious service: Not on file    Active member of club or organization: Not on file    Attends meetings of clubs or organizations: Not on file    Relationship status: Widowed  Other Topics Concern  . Not on file  Social History Narrative   Has 1 adopted daughter.    Outpatient Encounter Medications as of 03/31/2018  Medication Sig  . aspirin 81 MG tablet Take 81 mg by mouth daily.  . Calcium Carbonate-Vitamin D (CALCIUM + D) 600-200 MG-UNIT TABS Take 2 tablets by mouth 2 (two) times daily.   . cholecalciferol (VITAMIN D) 1000 UNITS tablet Take 2,000 Units by mouth daily.   . diphenhydramine-acetaminophen (TYLENOL PM) 25-500 MG TABS tablet Take 1 tablet by mouth at bedtime.   . furosemide (LASIX) 20 MG tablet 1 tablet every other day as needed  . letrozole (FEMARA) 2.5 MG tablet Take 1 tablet by mouth once daily  . levothyroxine (SYNTHROID, LEVOTHROID) 88 MCG tablet  Take 1 tablet by mouth daily  . metolazone (ZAROXOLYN) 2.5 MG tablet Take 1 tablet (2.5 mg total) by mouth daily. 30 minutes before furosemide dose  . metoprolol succinate (TOPROL-XL) 25 MG 24 hr tablet Take 1 tablet by mouth daily  . mirtazapine (REMERON) 7.5  MG tablet Take 1 tablet by mouth at bedtime (Dose change)  . omeprazole (PRILOSEC) 20 MG capsule Take 1 capsule by mouth 2 (two) times daily.  . ondansetron (ZOFRAN ODT) 4 MG disintegrating tablet Take 1 tablet (4 mg total) by mouth every 8 (eight) hours as needed for nausea or vomiting.  . polyethylene glycol (MIRALAX / GLYCOLAX) packet Take 17 g by mouth daily.  . tizanidine (ZANAFLEX) 2 MG capsule Take 1 capsule (2 mg total) by mouth 3 (three) times daily.  . traMADol (ULTRAM) 50 MG tablet Take 1 tablet (50 mg total) by mouth every 8 (eight) hours as needed.   No facility-administered encounter medications on file as of 03/31/2018.     Activities of Daily Living In your present state of health, do you have any difficulty performing the following activities: 03/31/2018 02/09/2018  Hearing? N N  Vision? N N  Difficulty concentrating or making decisions? N N  Walking or climbing stairs? N Y  Dressing or bathing? N N  Doing errands, shopping? N N  Preparing Food and eating ? N -  Using the Toilet? N -  In the past six months, have you accidently leaked urine? Y -  Do you have problems with loss of bowel control? N -  Managing your Medications? N -  Managing your Finances? N -  Housekeeping or managing your Housekeeping? N -  Some recent data might be hidden    Patient Care Team: [name], MD as PCP - General (Internal Medicine) [name], MD (Internal Medicine) Bary Castilla Forest Gleason, MD (General Surgery) Oneta Rack, MD (Dermatology)    Assessment:   This is a routine wellness examination for Marthella.  The goal of the wellness visit is to assist the patient how to close the gaps in care and create a preventative  care plan for the patient.   The roster of all physicians providing medical care to patient is listed in the Snapshot section of the chart.  Taking calcium VIT D as appropriate/Osteoporosis risk reviewed.    Safety issues reviewed; Smoke and carbon monoxide detectors in the home. No firearms or firearms locked in a safe within the home. Wears seatbelts when driving or riding with others. No violence in the home.  They do not have excessive sun exposure.  Discussed the need for sun protection: hats, long sleeves and the use of sunscreen if there is significant sun exposure.  Patient is alert, normal appearance, oriented to person/place/and time. Correctly identified the president of the [place] and recalls of 3/3 words.Performs simple calculations and can read correct time from watch face. Displays appropriate judgement.  No new identified risk were noted.  No failures at ADL's or IADL's.    BMI- discussed the importance of a healthy diet, water intake and the benefits of aerobic exercise. Educational material provided.   24 hour diet recall: Regular diet  Dental- every 6 months.  Sleep patterns- Sleeps through the night without issues.   Labs completed.   Health maintenance gaps- closed.  Patient Concerns: None at this time. Follow up with PCP as needed.  Exercise Activities and Dietary recommendations Current Exercise Habits: The patient does not participate in regular exercise at present  Goals    . Increase physical activity     Home dvd video (walking/exercise)    . Maintain healthy diet     Lean proteins and drink plenty of water       Fall Risk Fall Risk  03/31/2018  05/11/2017 03/30/2017 03/30/2016 08/12/2015  Falls in the past year? Yes No No No No  Number falls in past yr: 1 - - - -  Injury with Fall? No - - - -  Comment She missed her seating - - - -  Risk for fall due to : - - - - -  Follow up Falls prevention discussed - - - -   Depression Screen PHQ 2/9  Scores 03/31/2018 05/11/2017 03/30/2017 01/08/2017  PHQ - 2 Score 0 0 0 2  PHQ- 9 Score - - - 5     Cognitive Function MMSE - Mini Mental State Exam 03/31/2018 03/30/2017 08/12/2015  Orientation to time 5 5 5   Orientation to Place 5 5 5   Registration 3 3 3   Attention/ Calculation 5 5 5   Recall 3 3 3   Language- name 2 objects 2 2 2   Language- repeat 1 1 1   Language- follow 3 step command 3 3 3   Language- read & follow direction 1 1 1   Write a sentence 1 1 1   Copy design 1 1 1   Total score 30 30 30         Immunization History  Administered Date(s) Administered  . Influenza Split 07/08/2012  . Influenza, High Dose Seasonal PF 06/08/2016, 06/11/2017  . Influenza,inj,Quad PF,6+ Mos 06/08/2013, 05/04/2014, 05/06/2015  . Pneumococcal Conjugate-13 03/06/2014  . Pneumococcal-Unspecified 10/11/2007  . Tdap 01/05/2013  . Zoster 01/12/2012   Screening Tests Health Maintenance  Topic Date Due  . INFLUENZA VACCINE  04/07/2018  . TETANUS/TDAP  01/06/2023  . DEXA SCAN  Completed  . PNA vac Low Risk Adult  Completed       Plan:    End of life planning; Advance aging; Advanced directives discussed. Copy of current HCPOA/Living Will on file.    I have personally reviewed and noted the following in the patient's chart:   . Medical and social history . Use of alcohol, tobacco or illicit drugs  . Current medications and supplements . Functional ability and status . Nutritional status . Physical activity . Advanced directives . List of other physicians . Hospitalizations, surgeries, and ER visits in previous 12 months . Vitals . Screenings to include cognitive, depression, and falls . Referrals and appointments  In addition, I have reviewed and discussed with patient certain preventive protocols, quality metrics, and best practice recommendations. A written personalized care plan for preventive services as well as general preventive health recommendations were provided to patient.      Varney Biles, LPN  [id number]/[id number]   Reviewed above information.  Agree with assessment and plan.    Dr Nicki Reaper

## 2018-03-31 NOTE — Addendum Note (Signed)
Addended by: Leeanne Rio on: 03/31/2018 11:45 AM   Modules accepted: Orders

## 2018-04-03 ENCOUNTER — Other Ambulatory Visit: Payer: Self-pay | Admitting: Internal Medicine

## 2018-04-03 DIAGNOSIS — E032 Hypothyroidism due to medicaments and other exogenous substances: Secondary | ICD-10-CM

## 2018-04-03 DIAGNOSIS — E034 Atrophy of thyroid (acquired): Secondary | ICD-10-CM

## 2018-04-03 MED ORDER — LEVOTHYROXINE SODIUM 75 MCG PO TABS: 75.0000 ug | ORAL_TABLET | Freq: Every day | ORAL | 0 refills | Status: DC

## 2018-04-03 NOTE — Assessment & Plan Note (Signed)
TSH still suppressed  Reducing dose to 75 mcg daily .

## 2018-04-08 ENCOUNTER — Telehealth: Payer: Self-pay

## 2018-04-08 NOTE — Telephone Encounter (Signed)
Left a detailed message on a secure voicemail for [facility] giving clarification on the correct dose that pt is to be taking. 74mcg was d/c'd and reduced to 98mcg per pt's last lab result. Dr. Lupita Dawn documentation is in the mychart message related to the lab results.

## 2018-04-08 NOTE — Telephone Encounter (Signed)
Copied from [place] [id number]. Topic: General - Other >> Apr 08, 2018 11:06 AM Judyann Munson wrote: Reason for CRM:  Kim Santiago- from [facility]  is calling for verbals that the levothyroxine (SYNTHROID, LEVOTHROID) 88 MCG tablet is discounted and reduced to [facility]. The direct contact number is [phone]. Please advise

## 2018-04-13 ENCOUNTER — Ambulatory Visit: Payer: PPO | Admitting: Hematology and Oncology

## 2018-04-13 ENCOUNTER — Other Ambulatory Visit: Payer: PPO

## 2018-04-26 ENCOUNTER — Other Ambulatory Visit: Payer: Self-pay | Admitting: *Deleted

## 2018-04-26 DIAGNOSIS — Z170 Estrogen receptor positive status [ER+]: Principal | ICD-10-CM

## 2018-04-26 DIAGNOSIS — C50912 Malignant neoplasm of unspecified site of left female breast: Secondary | ICD-10-CM

## 2018-04-27 ENCOUNTER — Other Ambulatory Visit: Payer: Self-pay | Admitting: Hematology and Oncology

## 2018-04-27 ENCOUNTER — Inpatient Hospital Stay: Payer: PPO | Attending: Hematology and Oncology | Admitting: Hematology and Oncology

## 2018-04-27 ENCOUNTER — Inpatient Hospital Stay: Payer: PPO

## 2018-04-27 ENCOUNTER — Encounter: Payer: Self-pay | Admitting: Hematology and Oncology

## 2018-04-27 VITALS — BP 130/74 | HR 87 | Temp 98.0°F | Resp 18 | Wt 135.6 lb

## 2018-04-27 DIAGNOSIS — Z85850 Personal history of malignant neoplasm of thyroid: Secondary | ICD-10-CM | POA: Diagnosis not present

## 2018-04-27 DIAGNOSIS — Z170 Estrogen receptor positive status [ER+]: Secondary | ICD-10-CM | POA: Insufficient documentation

## 2018-04-27 DIAGNOSIS — Z9012 Acquired absence of left breast and nipple: Secondary | ICD-10-CM | POA: Insufficient documentation

## 2018-04-27 DIAGNOSIS — C50912 Malignant neoplasm of unspecified site of left female breast: Secondary | ICD-10-CM

## 2018-04-27 DIAGNOSIS — L931 Subacute cutaneous lupus erythematosus: Secondary | ICD-10-CM | POA: Insufficient documentation

## 2018-04-27 DIAGNOSIS — Z79811 Long term (current) use of aromatase inhibitors: Secondary | ICD-10-CM | POA: Insufficient documentation

## 2018-04-27 DIAGNOSIS — D709 Neutropenia, unspecified: Secondary | ICD-10-CM | POA: Insufficient documentation

## 2018-04-27 DIAGNOSIS — D649 Anemia, unspecified: Secondary | ICD-10-CM | POA: Insufficient documentation

## 2018-04-27 DIAGNOSIS — D696 Thrombocytopenia, unspecified: Secondary | ICD-10-CM | POA: Diagnosis not present

## 2018-04-27 DIAGNOSIS — R599 Enlarged lymph nodes, unspecified: Secondary | ICD-10-CM | POA: Diagnosis not present

## 2018-04-27 DIAGNOSIS — D72819 Decreased white blood cell count, unspecified: Secondary | ICD-10-CM

## 2018-04-27 DIAGNOSIS — M85852 Other specified disorders of bone density and structure, left thigh: Secondary | ICD-10-CM | POA: Diagnosis not present

## 2018-04-27 DIAGNOSIS — R590 Localized enlarged lymph nodes: Secondary | ICD-10-CM

## 2018-04-27 DIAGNOSIS — I252 Old myocardial infarction: Secondary | ICD-10-CM | POA: Insufficient documentation

## 2018-04-27 LAB — CBC WITH DIFFERENTIAL/PLATELET
Basophils Absolute: 0.1 10*3/uL (ref 0–0.1)
Basophils Relative: 2 %
Eosinophils Absolute: 0.1 10*3/uL (ref 0–0.7)
Eosinophils Relative: 4 %
HCT: 34.7 % — ABNORMAL LOW (ref 35.0–47.0)
Hemoglobin: 11.8 g/dL — ABNORMAL LOW (ref 12.0–16.0)
Lymphocytes Relative: 30 %
Lymphs Abs: 1 10*3/uL (ref 1.0–3.6)
MCH: 32.4 pg (ref 26.0–34.0)
MCHC: 34.1 g/dL (ref 32.0–36.0)
MCV: 95.1 fL (ref 80.0–100.0)
Monocytes Absolute: 0.3 10*3/uL (ref 0.2–0.9)
Monocytes Relative: 10 %
Neutro Abs: 1.7 10*3/uL (ref 1.4–6.5)
Neutrophils Relative %: 54 %
Platelets: 198 10*3/uL (ref 150–440)
RBC: 3.65 MIL/uL — ABNORMAL LOW (ref 3.80–5.20)
RDW: 14.2 % (ref 11.5–14.5)
WBC: 3.2 10*3/uL — ABNORMAL LOW (ref 3.6–11.0)

## 2018-04-27 LAB — COMPREHENSIVE METABOLIC PANEL
ALT: 6 U/L (ref 0–44)
AST: 21 U/L (ref 15–41)
Albumin: 3.9 g/dL (ref 3.5–5.0)
Alkaline Phosphatase: 73 U/L (ref 38–126)
Anion gap: 10 (ref 5–15)
BUN: 17 mg/dL (ref 8–23)
CO2: 27 mmol/L (ref 22–32)
Calcium: 9 mg/dL (ref 8.9–10.3)
Chloride: 100 mmol/L (ref 98–111)
Creatinine, Ser: 0.71 mg/dL (ref 0.44–1.00)
GFR calc Af Amer: >60 mL/min (ref >60)
GFR calc non Af Amer: >60 mL/min (ref >60)
Glucose, Bld: 129 mg/dL — ABNORMAL HIGH (ref 70–99)
Potassium: 4 mmol/L (ref 3.5–5.1)
Sodium: 137 mmol/L (ref 135–145)
Total Bilirubin: 0.5 mg/dL (ref 0.3–1.2)
Total Protein: 7.2 g/dL (ref 6.5–8.1)

## 2018-04-27 LAB — FOLATE: Folate: 11.2 ng/mL (ref >5.9)

## 2018-04-27 NOTE — Progress Notes (Signed)
Patient offers no complaints today. 

## 2018-04-27 NOTE — Progress Notes (Signed)
[facility] day:  04/27/2018   Chief Complaint: Kim Santiago is a 83 y.o. female with stage IIB left breast cancer who is seen for 4 month assessment on Femara.  HPI:  The patient was last seen in the medical oncology clinic on 12/08/2017.  At that time, she denied any breast concerns.  Exam revealed a stable breast exam and active cutaneous lupus. She was neutropenic.  WBC was 2400 with an ANC of 1300.  The etiology of her neutropenia was felt likely due to her increase in MTX for her cutaneous lupus.  She was to follow-up with her dermatologist, Dr. Kellie Moor, who prescribed MTX.  The patient was seen in the [facility] ER on 02/28/2018 with vomiting and abdominal pain.  Notes reviewed.  Two weeks prior she had been admitted with a UTI then rehospitalized with nausea and vomiting.  Azathioprine, used for her lupus, was discontinued. She described fevers up to 100.8.  She had crampy epigastric and RUQ pain associated with multiple episodes of nausea and vomiting.  CBC revealed a hematocrit of 34.6, hemoglobin 12.0, MCV 98.7, platelets 232,000, WBC 3900 with an ANC of 2400.  TSH was 0.093 (low).  Urinalysis revealed 11-20 WBC, small amount leukocytes, and few bacteria.  Urine culture revealed multiple species suggesting contamination.  Abdomen and pelvic CT revealed scattered fluid-filled small bowel loops with mildly prominent enhancement suggestive of enteritis.  There was no obstruction.  She received IV fluids, Rocephin and a 7 day course of ciprofloxacin.  TSH was 0.47 (normal) on 03/31/2018.  During the interim, patient notes that things have been going "ok" for the most part. She continues to struggle with control of her cutaneous lupus. Patient has been on several medications, each of which have been ultimately discontinued due to significant side effects. She was initially on Plaquenil, which caused her to have urticaria. She was switched to MTX,  which was implicated in causing her to have significant neutropenia. Most recently, patient was put on Azathioprine, which she notes made her "sick as a dog".  Patient states, "that last medication [Azathioprine] made me think that I was going to immanently die". Patient currently not taking any interventions for her lupus. She advises that a provider from [facility] comes to [place] once a month, and that she is scheduled to see her in 05/2018.  Patient continues to experience sequela of symptoms related to her Sjogren's disease. Patient has significant xerostomia, for which she uses Biotene gel and frequent gum chewing to control. She notes that her eyes are very dry as well. Patient uses cyclosporine ophthalmic drops (Restasis) to help with this symptom.   Patient denies that she has experienced any B symptoms. She denies any interval infections.  Patient does not verbalize any concerns with regards to her breasts today. Patient performs self breast examinations 3-4 times a week. Patient has been experiencing increased edema in her BILATERAL lower extremities.   Patient advises that she maintains an adequate appetite. She is eating well. Weight today is 135 lb 9.3 oz (61.5 kg), which compared to her last visit to the clinic, represents a 3 pound decrease.   Patient denies pain in the clinic today.   Past Medical History:  Diagnosis Date  . Arthritis   . Breast cancer ([place]) 2017   left mastectomy done 11/2015  . Breast cancer in female [facility]) 11/18/2015   Left: 3.9 cm tumor, T2, 1/2 sentinel nodes positive for macro metastatic disease, N1,  3 negative nodes in the axillary tail, ER+,PR+, Her 2 neu, low Mammoprint score  . Cancer [facility])    thyroid takes levothyroxine  . Chronic kidney disease    UTI  . Genetic screening March 2017.   Mammoprint of left breast cancer: Low risk for recurrence.   . Hypertension   . hypothyroidism    secondary to thyroidectomy for thyroid ca  . Hypothyroidism   .  Lupus ([facility])    subcutaneous  . Menopause 40s   natural, hot flashes and mood lability now gone, off prempro 7 months  . Myocardial infarction ([place]) 2013  . Osteoporosis    Osteopenia  . Rosacea   . Sjoegren syndrome   . Stress-induced cardiomyopathy September of 2013   EF 35%. Peak troponin was 1.8.    Past Surgical History:  Procedure Laterality Date  . BACK SURGERY    . BREAST BIOPSY Left 10/30/15   positive, done in Dr. Dwyane Luo office  . CARDIAC CATHETERIZATION  05/2012   [facility]. No significant CAD. Ejection fraction of 35% due to stress-induced cardiomyopathy.  . CHOLECYSTECTOMY    . COLONOSCOPY    . DILATION AND CURETTAGE OF UTERUS    . KYPHOSIS SURGERY  Feb 2008   L1, Dr. Mauri Pole  . LUMBAR DISC SURGERY     L4-L5  . MASTECTOMY Left 11/18/2015   positive  . SENTINEL NODE BIOPSY Left 11/18/2015   Procedure: SENTINEL NODE BIOPSY;  Surgeon: Robert Bellow, MD;  Location: [facility] ORS;  Service: General;  Laterality: Left;  . SHOULDER ARTHROSCOPY  2004   Left, Dr. Jefm Bryant  . SIMPLE MASTECTOMY WITH AXILLARY SENTINEL NODE BIOPSY Left 11/18/2015   Procedure: SIMPLE MASTECTOMY;  Surgeon: Robert Bellow, MD;  Location: [facility] ORS;  Service: General;  Laterality: Left;  . SPINE SURGERY     L4-5 diskectomy  . THYROIDECTOMY     Thyroid Cancer  . TONSILLECTOMY    . TUBAL LIGATION      Family History  Problem Relation Age of Onset  . COPD Father   . Cancer Father        esophageal  . Kidney disease Mother   . Heart disease Mother   . Kidney disease Sister   . Cancer Brother 69       colon cancer (both brothers)  . Heart attack Brother 70  . Heart disease Brother   . Breast cancer Neg Hx     Social History:  reports that she has never smoked. She has never used smokeless tobacco. She reports that she does not drink alcohol or use drugs.  She is from [place].  She was married for 61 years.  Her husband died 2 months ago.  She lives alone in [place].  She has an  adopted daughter and 2 grandchildren.  Her brother died of a MI following a cholecystectomy in 12/2015.  The patient is alone today.  Allergies:  Allergies  Allergen Reactions  . Azathioprine Nausea And Vomiting    Severe vomiting  . Amoxicillin Rash    Has patient had a PCN reaction causing immediate rash, facial/tongue/throat swelling, SOB or lightheadedness with hypotension: Unknown Has patient had a PCN reaction causing severe rash involving mucus membranes or skin necrosis: Unknown Has patient had a PCN reaction that required hospitalization: Unknown Has patient had a PCN reaction occurring within the last 10 years: Unknown If all of the above answers are "NO", then may proceed with Cephalosporin use. Tolerates ceftriaxone and Ancef  .  Codeine Nausea And Vomiting  . Naprosyn [Naproxen] Swelling  . Orudis [Ketoprofen] Hives  . Plaquenil [Hydroxychloroquine] Hives  . Sulfathiazole Rash    Current Medications: Current Outpatient Medications  Medication Sig Dispense Refill  . aspirin 81 MG tablet Take 81 mg by mouth daily.    . Calcium Carbonate-Vitamin D (CALCIUM + D) 600-200 MG-UNIT TABS Take 2 tablets by mouth 2 (two) times daily.     . cholecalciferol (VITAMIN D) 1000 UNITS tablet Take 2,000 Units by mouth daily.     . furosemide (LASIX) 20 MG tablet 1 tablet every other day as needed 30 tablet 3  . letrozole (FEMARA) 2.5 MG tablet Take 1 tablet by mouth once daily 90 tablet 1  . levothyroxine (SYNTHROID, LEVOTHROID) 75 MCG tablet Take 1 tablet (75 mcg total) by mouth daily. 90 tablet 0  . metolazone (ZAROXOLYN) 2.5 MG tablet TAKE 1 TABLET (2.5 MG TOTAL) BY MOUTH DAILY. 30 MINUTES BEFORE FUROSEMIDE DOSE 90 tablet 1  . metoprolol succinate (TOPROL-XL) 25 MG 24 hr tablet Take 1 tablet by mouth daily 90 tablet 1  . mirtazapine (REMERON) 7.5 MG tablet Take 1 tablet by mouth at bedtime (Dose change) 90 tablet 1  . omeprazole (PRILOSEC) 20 MG capsule Take 1 capsule by mouth 2 (two)  times daily.    . polyethylene glycol (MIRALAX / GLYCOLAX) packet Take 17 g by mouth daily.    . Probiotic Product (PROBIOTIC PO) Take 1 tablet by mouth daily.    . tizanidine (ZANAFLEX) 2 MG capsule Take 1 capsule (2 mg total) by mouth 3 (three) times daily. 90 capsule 0  . traMADol (ULTRAM) 50 MG tablet Take 1 tablet (50 mg total) by mouth every 8 (eight) hours as needed. 60 tablet 2  . ondansetron (ZOFRAN ODT) 4 MG disintegrating tablet Take 1 tablet (4 mg total) by mouth every 8 (eight) hours as needed for nausea or vomiting. (Patient not taking: Reported on 04/27/2018) 20 tablet 0  . triamcinolone ointment (KENALOG) 0.1 % Apply 1 application topically daily.  0   No current facility-administered medications for this visit.     Review of Systems  Constitutional: Positive for weight loss (down 3 pound). Negative for diaphoresis, fever and malaise/fatigue.       "I am doing ok"  HENT: Negative for nosebleeds and sore throat.        Xerostomia r/t Sjogren's   Eyes: Negative for pain and redness.       Dry eyes r/t Sjogren's  Respiratory: Negative for cough, hemoptysis, sputum production and shortness of breath.   Cardiovascular: Positive for leg swelling. Negative for chest pain, palpitations, orthopnea and PND.  Gastrointestinal: Positive for heartburn. Negative for abdominal pain, blood in stool, constipation, diarrhea, melena, nausea and vomiting.  Genitourinary: Negative for dysuria, frequency, hematuria and urgency.       Recent UTI  Musculoskeletal: Positive for joint pain. Negative for back pain, falls and myalgias.       Lower extremity cramping  Skin: Positive for rash (cutaneous lupus). Negative for itching.  Neurological: Negative for dizziness, tremors, weakness and headaches.  Endo/Heme/Allergies: Does not bruise/bleed easily.       HYPOthyroidism on levothyroxine  Psychiatric/Behavioral: Negative for depression, memory loss and suicidal ideas. The patient is not  nervous/anxious and does not have insomnia.   All other systems reviewed and are negative.  Performance status (ECOG): 1 - Symptomatic but completely ambulatory  Vital Signs BP 130/74 (BP Location: Right Arm, Patient Position: Sitting)  Pulse 87   Temp 98 F (36.7 C) (Tympanic)   Resp 18   Wt 135 lb 9.3 oz (61.5 kg)   BMI 23.46 kg/m   Physical Exam  Constitutional: She is oriented to person, place, and time and well-developed, well-nourished, and in no distress.  HENT:  Head: Normocephalic and atraumatic.  Short gray hair.  Dry mouth.  Eyes: Pupils are equal, round, and reactive to light. Conjunctivae and EOM are normal. No scleral icterus.  Glasses.  Blue eyes.  Neck: Normal range of motion. Neck supple. No JVD present.  Cardiovascular: Normal rate, regular rhythm and normal heart sounds. Exam reveals no gallop and no friction rub.  No murmur heard. Pulmonary/Chest: Effort normal and breath sounds normal. No respiratory distress. She has no wheezes. She has no rales. Left breast exhibits skin change (fibrocystic changes). Left breast exhibits no inverted nipple, no mass and no nipple discharge. Breasts are asymmetrical (LEFT breast surgically absent).  Abdominal: Soft. Bowel sounds are normal. She exhibits no distension. There is no tenderness.  Musculoskeletal: Normal range of motion. She exhibits no edema or tenderness.  Lymphadenopathy:    She has no cervical adenopathy.    She has no axillary adenopathy.       Right: No inguinal and no supraclavicular adenopathy present.       Left: No inguinal and no supraclavicular adenopathy present.  Neurological: She is alert and oriented to person, place, and time.  Skin: Skin is warm and dry. Rash (cutaneous lupus) noted. No erythema.  Psychiatric: Mood, affect and judgment normal.  Nursing note and vitals reviewed.   Labs: Appointment on 04/27/2018  Component Date Value Ref Range Status  . Sodium 04/27/2018 137  135 - 145  mmol/L Final  . Potassium 04/27/2018 4.0  3.5 - 5.1 mmol/L Final  . Chloride 04/27/2018 100  98 - 111 mmol/L Final  . CO2 04/27/2018 27  22 - 32 mmol/L Final  . Glucose, Bld 04/27/2018 129* 70 - 99 mg/dL Final  . BUN 04/27/2018 17  8 - 23 mg/dL Final  . Creatinine, Ser 04/27/2018 0.71  0.44 - 1.00 mg/dL Final  . Calcium 04/27/2018 9.0  8.9 - 10.3 mg/dL Final  . Total Protein 04/27/2018 7.2  6.5 - 8.1 g/dL Final  . Albumin 04/27/2018 3.9  3.5 - 5.0 g/dL Final  . AST 04/27/2018 21  15 - 41 U/L Final  . ALT 04/27/2018 6  0 - 44 U/L Final  . Alkaline Phosphatase 04/27/2018 73  38 - 126 U/L Final  . Total Bilirubin 04/27/2018 0.5  0.3 - 1.2 mg/dL Final  . GFR calc non Af Amer 04/27/2018 >60  >60 mL/min Final  . GFR calc Af Amer 04/27/2018 >60  >60 mL/min Final   Comment: (NOTE) The eGFR has been calculated using the CKD EPI equation. This calculation has not been validated in all clinical situations. eGFR's persistently <60 mL/min signify possible Chronic Kidney Disease.   Georgiann Hahn gap 04/27/2018 10  5 - 15 Final   Performed at [facility], [address] [place], [place] [address]  . WBC 04/27/2018 3.2* 3.6 - 11.0 K/uL Final  . RBC 04/27/2018 3.65* 3.80 - 5.20 MIL/uL Final  . Hemoglobin 04/27/2018 11.8* 12.0 - 16.0 g/dL Final  . HCT 04/27/2018 34.7* 35.0 - 47.0 % Final  . MCV 04/27/2018 95.1  80.0 - 100.0 fL Final  . MCH 04/27/2018 32.4  26.0 - 34.0 pg Final  . MCHC 04/27/2018 34.1  32.0 -  36.0 g/dL Final  . RDW 04/27/2018 14.2  11.5 - 14.5 % Final  . Platelets 04/27/2018 198  150 - 440 K/uL Final  . Neutrophils Relative % 04/27/2018 54  % Final  . Neutro Abs 04/27/2018 1.7  1.4 - 6.5 K/uL Final  . Lymphocytes Relative 04/27/2018 30  % Final  . Lymphs Abs 04/27/2018 1.0  1.0 - 3.6 K/uL Final  . Monocytes Relative 04/27/2018 10  % Final  . Monocytes Absolute 04/27/2018 0.3  0.2 - 0.9 K/uL Final  . Eosinophils Relative 04/27/2018 4  % Final  . Eosinophils Absolute  04/27/2018 0.1  0 - 0.7 K/uL Final  . Basophils Relative 04/27/2018 2  % Final  . Basophils Absolute 04/27/2018 0.1  0 - 0.1 K/uL Final   Performed at [facility], [address] [place], [place] [address]     Assessment:  Kim Santiago is a 83 y.o. female with stage IIB left breast cancer s/p mastectomy on 11/18/2015.  Pathology revealed a 3.9 cm grade II invasive mammary carcinoma.  There was ductal carcinoma in situ (DCIS).  There was lymphvascular and dermal lymphatic invasion.  Margins were negative.  One of 2 sentinel lymph nodes were positive for macrometastasis.  In addition, there were 3 benign axillary lymph nodes (total 5 lymph nodes).  Tumor was estrogen receptor positive (100%), progesterone receptor positive (70%), and Her2/neu negative.  Pathologic stage was T2N1a.   Bilateral mammogram and ultrasound on 11/11/2015 revealed a 3 cm left breast mass at the 6 o'clock position (1 cm from the nipple) and a 1.6 cm irregular hypoechoic left breast mass at 1 o'clock position (5 cm from the nipple) with overlying skin thickening.  Right mammogram on 11/18/2016 revealed no evidence of malignancy.  Right mammogram on 11/23/2017 revealed no evidence of malignancy.  MammaPrint on 12/18/2015 revealed a low risk of recurrence with a 5% risk of distant recurrence of 5 years and a 10% recurrence at 10 years.  She declined radiation.  She began Femara on 01/09/2016.  She has a little joint pain and a few hot flashes.  PET scan on 12/23/2016 revealed slightly enlarged right lower paratracheal lymph node and some clustered small AP window lymph nodes with metabolic activity very minimally above background in the mediastinum.  There were no other findings of hypermetabolic lesions in the neck, chest, abdomen, or pelvis.  Chest CT on 03/16/2017 revealed stable mild mediastinal lymphadenopathy.  Largest node was 1.0 cm in the right paratracheal region.  There was no new or progressive disease  within thorax.  Chest CT on 09/23/2017 revealed stable mild mediastinal adenopathy (1.1 cm precarinal and 0.9 cm pretracheal lymph node).  There was no new or progressive disease.  CA27.29 has been followed: 30.4 on 11/15/2015, 32.5 on 05/13/2016, 19.0 on 08/12/2016, 40.8 on 11/11/2016, 45.8 on 12/14/2016, 31.6 on 03/17/2017, 45.7 on 07/14/2017, 43.4 on 12/08/2017, and 33.2 on 04/27/2018.  Bone density study on 12/05/2015 revealed osteopenia with a T-score of -1.5 in the left femoral neck.  Bone density study on 12/07/2017 revealed osteopenia with a T-score of -1.6 in the right femoral neck.  She is on calcium and vitamin D.  She has a history of a myocardial infarction in 06/2012.  Echocardiogram revealed an EF of 35%.  Follow-up echo on 09/21/2015 revealed an EF of 55-60%.  Patient is unaware of a history of heart failure.   She has a history of thyroid carcinoma 40 years ago treated with I-131.  She has a mild normocytic anemia.  Diet is good.  Colonoscopy on 07/25/2013 revealed 3 small polyps (54m proximal descending colon, 3 mm splenic flexure, 1 mm ascending colon).  She denies any melena or hematochezia.  TSH was normal on 12/26/2015 and 03/31/2018.  She has cutaneous lupus and Sjogren's.  She had a reaction to Plaquenil.  She completed an extended steroid taper. She is on methotrexate 8 tablets every Wednesday (increased 3-4 weeks ago).  Symptomatically, patient is doing well overall from an oncology stand point. She struggles to control her cutaneous lupus; not currently on intervention. No B symptoms or breast concerns. Exam is stable.   Plan: 1. Labs today:  CBC with diff, CMP, CA27.29, B12, folate. 2. Stage IIB left breast cancer - treatment ongoing  Doing well. No breast concerns. CA27.29 is normal.  Continue Femara as previously prescribed.   Right sided mammogram due 11/24/2018. 3. Leukopenia - stable   WBC 3200 (ANC 1700, ALC 1000).  Discuss possibility of being related to  medications used in the past for lupus.   Check B12 and folate today.  Will add Cu level to next lab draw to further assess.  4. Mediastinal adenopathy - ongoing assessment  Due for repeat CT imaging on 09/23/2018 5. Osteopenia - stable  T-score -1.6. Ten-year fracture probability by FRAX of 3% or greater for hip fracture or 20% or greater for major osteoporotic fracture.  Patient remains on calcium and vitamin D supplementation as prescribed.   Given use of AI therapy for breast cancer, patient should be on bone strengthening medication, whether it be an oral bisphosphonate or Prolia.   Discuss the use of Prolia to prevent bone thinning/loss associated with endocrine therapy. Side effects of this medication reviewed. Discussed the need for dental clearance prior to beginning Prolia, as this medication is associated with an increased risk of dental complications such as osteonecrosis of the jaw.  6. Cutaneous lupus - treatment ongoing  Patient has suffered significant side effects related to the use of medication for this condition. She has been on Plaquenil, MTX, and Azathioprine.   [facility] 7. RTC in 4 months for MD assessment and labs (CBC with diff, CMP, Cu, CA27.29).    BHonor Loh NP  04/27/2018, 5:36 PM  I saw and evaluated the patient, participating in the key portions of the service and reviewing pertinent diagnostic studies and records.  I reviewed the nurse practitioner's note and agree with the findings and the plan.  The assessment and plan were discussed with the patient.  Several questions were asked by the patient and answered.   MNolon Stalls MD 04/27/2018,5:36 PM

## 2018-04-28 LAB — VITAMIN B12: Vitamin B-12: 678 pg/mL (ref 180–914)

## 2018-04-28 LAB — CANCER ANTIGEN 27.29: CA 27.29: 33.2 U/mL (ref 0.0–38.6)

## 2018-04-29 ENCOUNTER — Telehealth: Payer: Self-pay

## 2018-04-29 ENCOUNTER — Encounter: Payer: Self-pay | Admitting: Internal Medicine

## 2018-04-29 ENCOUNTER — Ambulatory Visit (INDEPENDENT_AMBULATORY_CARE_PROVIDER_SITE_OTHER): Payer: PPO | Admitting: Internal Medicine

## 2018-04-29 VITALS — BP 116/70 | HR 78 | Temp 98.5°F | Resp 15 | Ht 63.75 in | Wt 136.8 lb

## 2018-04-29 DIAGNOSIS — L930 Discoid lupus erythematosus: Secondary | ICD-10-CM | POA: Diagnosis not present

## 2018-04-29 DIAGNOSIS — Z170 Estrogen receptor positive status [ER+]: Secondary | ICD-10-CM | POA: Diagnosis not present

## 2018-04-29 DIAGNOSIS — Z0000 Encounter for general adult medical examination without abnormal findings: Secondary | ICD-10-CM

## 2018-04-29 DIAGNOSIS — I872 Venous insufficiency (chronic) (peripheral): Secondary | ICD-10-CM

## 2018-04-29 DIAGNOSIS — N3000 Acute cystitis without hematuria: Secondary | ICD-10-CM | POA: Diagnosis not present

## 2018-04-29 DIAGNOSIS — F331 Major depressive disorder, recurrent, moderate: Secondary | ICD-10-CM | POA: Diagnosis not present

## 2018-04-29 DIAGNOSIS — C50912 Malignant neoplasm of unspecified site of left female breast: Secondary | ICD-10-CM | POA: Diagnosis not present

## 2018-04-29 DIAGNOSIS — M818 Other osteoporosis without current pathological fracture: Secondary | ICD-10-CM | POA: Diagnosis not present

## 2018-04-29 DIAGNOSIS — M810 Age-related osteoporosis without current pathological fracture: Secondary | ICD-10-CM

## 2018-04-29 DIAGNOSIS — F4321 Adjustment disorder with depressed mood: Secondary | ICD-10-CM | POA: Diagnosis not present

## 2018-04-29 LAB — POCT URINALYSIS DIPSTICK
BILIRUBIN UA: NEGATIVE
Blood, UA: NEGATIVE
GLUCOSE UA: NEGATIVE
Ketones, UA: NEGATIVE
Nitrite, UA: NEGATIVE
Protein, UA: NEGATIVE
Spec Grav, UA: 1.01 (ref 1.010–1.025)
Urobilinogen, UA: 0.2 E.U./dL
pH, UA: 7 (ref 5.0–8.0)

## 2018-04-29 NOTE — Progress Notes (Signed)
Patient ID: Kim Santiago, female    DOB: [birth year]  Age: 83 y.o. MRN: [id number]  The patient is here for annual preventive exam and management of other chronic and acute problems.   The risk factors are reflected in the social history.  The roster of all physicians providing medical care to patient - is listed in the Snapshot section of the chart.  Activities of daily living:  The patient is 100% independent in all ADLs: dressing, toileting, feeding as well as independent mobility  Home safety : The patient has smoke detectors in the home. They wear seatbelts.  There are no firearms at home. There is no violence in the home.   There is no risks for hepatitis, STDs or HIV. There is no   history of blood transfusion. They have no travel history to infectious disease endemic areas of the world.  The patient has seen their dentist in the last six month. They have seen their eye doctor in the last year. They admit to slight hearing difficulty with regard to whispered voices and some television programs.  They have deferred audiologic testing in the last year.  They do not  have excessive sun exposure. Discussed the need for sun protection: hats, long sleeves and use of sunscreen if there is significant sun exposure.   Diet: the importance of a healthy diet is discussed. They do have a healthy diet.  The benefits of regular aerobic exercise were discussed. She walks 4 times per week ,  20 minutes.   Depression screen: there are no signs or vegative symptoms of depression- irritability, change in appetite, anhedonia, sadness/tearfullness.  Cognitive assessment: the patient manages all their financial and personal affairs and is actively engaged. They could relate day,date,year and events; recalled 2/3 objects at 3 minutes; performed clock-face test normally.  The following portions of the patient's history were reviewed and updated as appropriate: allergies, current medications, past family  history, past medical history,  past surgical history, past social history  and problem list.  Visual acuity was not assessed per patient preference since she has regular follow up with her ophthalmologist. Hearing and body mass index were assessed and reviewed.   During the course of the visit the patient was educated and counseled about appropriate screening and preventive services including : fall prevention , diabetes screening, nutrition counseling, colorectal cancer screening, and recommended immunizations.    CC: The primary encounter diagnosis was Acute cystitis without hematuria. Diagnoses of Grief, Osteoporosis of lumbar spine, Discoid lupus, Venous insufficiency of both lower extremities, Malignant neoplasm of left breast in female, estrogen receptor positive, unspecified site of breast ([place]), Major depressive disorder, recurrent episode, moderate ([place]), and Encounter for preventive health examination were also pertinent to this visit.   "i'm not doing well."  Multiple issues:  1) she is grieving the unexpected  loss of her sister who died last week.    2) she continues to have lower extremity edema despite use of diuretics,  Although the edema has improved.  She is wearing compression stockings during the day and elevating her  legs.   3) Discoid lupus: her dermatologist has stopped her medication due to recurrent nausea and vomiting .  She has been referred to [facility] for management of discoid SLE  given past reactions  4) she is Having recurrent abnormal urinary symptoms, but denies dysuria .  Requesting a UA     History Nira has a past medical history of Arthritis, Breast cancer ([place]) (2017), Breast  cancer in female [facility]) (11/18/2015), Cancer [facility]), Chronic kidney disease, Genetic screening (March 2017.), Hypertension, hypothyroidism, Hypothyroidism, Lupus ([place]), Menopause (88s), Myocardial infarction ([place]) (2013), Osteoporosis, Rosacea, Sjoegren syndrome, and Stress-induced  cardiomyopathy (September of 2013).   She has a past surgical history that includes Kyphosis surgery (Feb 2008); Cholecystectomy; Tonsillectomy; Tubal ligation; Lumbar disc surgery; Shoulder arthroscopy (2004); Thyroidectomy; Spine surgery; Cardiac catheterization (05/2012); Dilation and curettage of uterus; Back surgery; Simple mastectomy with axillary sentinel node biopsy (Left, 11/18/2015); Sentinel node biopsy (Left, 11/18/2015); Colonoscopy; Breast biopsy (Left, 10/30/15); and Mastectomy (Left, 11/18/2015).   Her family history includes COPD in her father; Cancer in her father; Cancer (age of onset: 54) in her brother; Heart attack (age of onset: 99) in her brother; Heart disease in her brother and mother; Kidney disease in her mother and sister.She reports that she has never smoked. She has never used smokeless tobacco. She reports that she does not drink alcohol or use drugs.  Outpatient Medications Prior to Visit  Medication Sig Dispense Refill  . aspirin 81 MG tablet Take 81 mg by mouth daily.    . Calcium Carbonate-Vitamin D (CALCIUM + D) 600-200 MG-UNIT TABS Take 2 tablets by mouth 2 (two) times daily.     . cholecalciferol (VITAMIN D) 1000 UNITS tablet Take 2,000 Units by mouth daily.     . furosemide (LASIX) 20 MG tablet 1 tablet every other day as needed 30 tablet 3  . letrozole (FEMARA) 2.5 MG tablet Take 1 tablet by mouth once daily 90 tablet 1  . levothyroxine (SYNTHROID, LEVOTHROID) 75 MCG tablet Take 1 tablet (75 mcg total) by mouth daily. 90 tablet 0  . metolazone (ZAROXOLYN) 2.5 MG tablet TAKE 1 TABLET (2.5 MG TOTAL) BY MOUTH DAILY. 30 MINUTES BEFORE FUROSEMIDE DOSE 90 tablet 1  . metoprolol succinate (TOPROL-XL) 25 MG 24 hr tablet Take 1 tablet by mouth daily 90 tablet 1  . mirtazapine (REMERON) 7.5 MG tablet Take 1 tablet by mouth at bedtime (Dose change) 90 tablet 1  . omeprazole (PRILOSEC) 20 MG capsule Take 1 capsule by mouth 2 (two) times daily.    . ondansetron (ZOFRAN ODT)  4 MG disintegrating tablet Take 1 tablet (4 mg total) by mouth every 8 (eight) hours as needed for nausea or vomiting. 20 tablet 0  . polyethylene glycol (MIRALAX / GLYCOLAX) packet Take 17 g by mouth daily.    . Probiotic Product (PROBIOTIC PO) Take 1 tablet by mouth daily.    . tizanidine (ZANAFLEX) 2 MG capsule Take 1 capsule (2 mg total) by mouth 3 (three) times daily. 90 capsule 0  . traMADol (ULTRAM) 50 MG tablet Take 1 tablet (50 mg total) by mouth every 8 (eight) hours as needed. 60 tablet 2  . triamcinolone ointment (KENALOG) 0.1 % Apply 1 application topically daily.  0   No facility-administered medications prior to visit.     Review of Systems   Patient denies headache, fevers, malaise, unintentional weight loss, skin rash, eye pain, sinus congestion and sinus pain, sore throat, dysphagia,  hemoptysis , cough, dyspnea, wheezing, chest pain, palpitations, orthopnea, edema, abdominal pain, nausea, melena, diarrhea, constipation, flank pain, dysuria, hematuria, urinary  Frequency, nocturia, numbness, tingling, seizures,  Focal weakness, Loss of consciousness,  Tremor, insomnia, depression, anxiety, and suicidal ideation.      Objective:  BP 116/70 (BP Location: Right Arm, Patient Position: Sitting, Cuff Size: Normal)   Pulse 78   Temp 98.5 F (36.9 C) (Oral)   Resp 15  Ht 5' 3.75" (1.619 m)   Wt 136 lb 12.8 oz (62.1 kg)   SpO2 98%   BMI 23.67 kg/m   Physical Exam   General appearance: alert, cooperative and appears stated age Head: Normocephalic, without obvious abnormality, atraumatic Eyes: conjunctivae/corneas clear. PERRL, EOM's intact. Fundi benign. Ears: normal TM's and external ear canals both ears Nose: Nares normal. Septum midline. Mucosa normal. No drainage or sinus tenderness. Throat: lips, mucosa, and tongue normal; teeth and gums normal Neck: no adenopathy, no carotid bruit, no JVD, supple, symmetrical, trachea midline and thyroid not enlarged, symmetric, no  tenderness/mass/nodules Lungs: clear to auscultation bilaterally Breasts: normal appearance, no masses or tenderness Heart: regular rate and rhythm, S1, S2 normal, no murmur, click, rub or gallop Abdomen: soft, non-tender; bowel sounds normal; no masses,  no organomegaly Extremities: extremities normal, atraumatic, no cyanosis or edema Pulses: 2+ and symmetric Skin: Skin color, texture, turgor normal. No rashes or lesions Neurologic: Alert and oriented X 3, normal strength and tone. Normal symmetric reflexes. Normal coordination and gait.      Assessment & Plan:   Problem List Items Addressed This Visit    Grief    Patient is dealing with the unexpected loss of her sister , but she has  adequate coping skills and emotional support .  i have asked patient to return in one month to examine for signs of unresolving grief.       Major depressive disorder, recurrent episode, moderate ([place])    Secondary to loss of husband,  Health issues.  Resumed remeron in mid June at 7.5 mg,  encouraged to increase dose to 15 mg qhs but she did not tolerate higher dose.        Encounter for preventive health examination    Annual comprehensive preventive exam was done as well as an evaluation and management of chronic conditions .  During the course of the visit the patient was educated and counseled about appropriate screening and preventive services including :  diabetes screening, lipid analysis with projected  10 year  risk for CAD , nutrition counseling, breast, cervical and colorectal cancer screening, and recommended immunizations.  Printed recommendations for health maintenance screenings was given      Malignant neoplasm of left female breast ([place])    Diagnosed in left breast Feb 2017 during diagnostic mammogram of left breast. Treated with simple mastectomy.  One positive LN> .  Chemo offered but deferred in favor of adjuvant therapy. (Femara).  Annual screening mastectomy has been done in March,   Ordered by [employer].       Osteoporosis of lumbar spine    Diagnosed with L1 vertebral fracture. She has osteopenia by DEXA with No change by repeat DEXA April 2019 .  Femur T score -1.6  Will repeat in 2 year since she is still taking  Femara.       Discoid lupus    Currently un managed due to recurrent demonstrated intolerance to  Imuran,  after multiple failed trials (placquenil and Methotrexate) .  She has been referred to [facility] rheum by her dermatologist       Venous insufficiency of both lower extremities    Managed with compression stockings and every other day use of diuretics.      UTI (urinary tract infection) - Primary    Treated in June for Klebsiella UTI with 3 subsequent cultures that were negative .  Repeat UA ordered today is abnormal,  And culture is suggestive of infection with  GPC .  Will await speciation and sensitivies.       Relevant Orders   POCT urinalysis dipstick (Completed)   Urine Microscopic Only (Completed)   Urine Culture (Completed)      I am having Doria R. Rosier maintain her cholecalciferol, Calcium Carbonate-Vitamin D, aspirin, polyethylene glycol, mirtazapine, metoprolol succinate, letrozole, omeprazole, tizanidine, traMADol, furosemide, ondansetron, metolazone, levothyroxine, triamcinolone ointment, and Probiotic Product (PROBIOTIC PO).  No orders of the defined types were placed in this encounter.   There are no discontinued medications.  Follow-up: No follow-ups on file.   [name], MD

## 2018-04-29 NOTE — Patient Instructions (Signed)
I am so very sorry about the loss of your sister,  But  I know it comforts you to know that you will see her again   Your right ear pain does not appear to be due to an abscess or infection.    I think your cartilage is enflamed from the lupus.  You can try using your steroid cream on the area for a few days to see if it helps.   I am checking your urine today.   IF your urine IS DEFINITELY SUGGESTIVE OF INFECTION ,   I will call you in an antibiotic     We will repeat your labs on h day of your next visit    Health Maintenance for Postmenopausal Women Menopause is a normal process in which your reproductive ability comes to an end. This process happens gradually over a span of months to years, usually between the ages of 74 and 75. Menopause is complete when you have missed 12 consecutive menstrual periods. It is important to talk with your health care provider about some of the most common conditions that affect postmenopausal women, such as heart disease, cancer, and bone loss (osteoporosis). Adopting a healthy lifestyle and getting preventive care can help to promote your health and wellness. Those actions can also lower your chances of developing some of these common conditions. What should I know about menopause? During menopause, you may experience a number of symptoms, such as:  Moderate-to-severe hot flashes.  Night sweats.  Decrease in sex drive.  Mood swings.  Headaches.  Tiredness.  Irritability.  Memory problems.  Insomnia.  Choosing to treat or not to treat menopausal changes is an individual decision that you make with your health care provider. What should I know about hormone replacement therapy and supplements? Hormone therapy products are effective for treating symptoms that are associated with menopause, such as hot flashes and night sweats. Hormone replacement carries certain risks, especially as you become older. If you are thinking about using estrogen or  estrogen with progestin treatments, discuss the benefits and risks with your health care provider. What should I know about heart disease and stroke? Heart disease, heart attack, and stroke become more likely as you age. This may be due, in part, to the hormonal changes that your body experiences during menopause. These can affect how your body processes dietary fats, triglycerides, and cholesterol. Heart attack and stroke are both medical emergencies. There are many things that you can do to help prevent heart disease and stroke:  Have your blood pressure checked at least every 1-2 years. High blood pressure causes heart disease and increases the risk of stroke.  If you are 7-5 years old, ask your health care provider if you should take aspirin to prevent a heart attack or a stroke.  Do not use any tobacco products, including cigarettes, chewing tobacco, or electronic cigarettes. If you need help quitting, ask your health care provider.  It is important to eat a healthy diet and maintain a healthy weight. ? Be sure to include plenty of vegetables, fruits, low-fat dairy products, and lean protein. ? Avoid eating foods that are high in solid fats, added sugars, or salt (sodium).  Get regular exercise. This is one of the most important things that you can do for your health. ? Try to exercise for at least 150 minutes each week. The type of exercise that you do should increase your heart rate and make you sweat. This is known as moderate-intensity exercise. ?  Try to do strengthening exercises at least twice each week. Do these in addition to the moderate-intensity exercise.  Know your numbers.Ask your health care provider to check your cholesterol and your blood glucose. Continue to have your blood tested as directed by your health care provider.  What should I know about cancer screening? There are several types of cancer. Take the following steps to reduce your risk and to catch any cancer  development as early as possible. Breast Cancer  Practice breast self-awareness. ? This means understanding how your breasts normally appear and feel. ? It also means doing regular breast self-exams. Let your health care provider know about any changes, no matter how small.  If you are 10 or older, have a clinician do a breast exam (clinical breast exam or CBE) every year. Depending on your age, family history, and medical history, it may be recommended that you also have a yearly breast X-ray (mammogram).  If you have a family history of breast cancer, talk with your health care provider about genetic screening.  If you are at high risk for breast cancer, talk with your health care provider about having an MRI and a mammogram every year.  Breast cancer (BRCA) gene test is recommended for women who have family members with BRCA-related cancers. Results of the assessment will determine the need for genetic counseling and BRCA1 and for BRCA2 testing. BRCA-related cancers include these types: ? Breast. This occurs in males or females. ? Ovarian. ? Tubal. This may also be called fallopian tube cancer. ? Cancer of the abdominal or pelvic lining (peritoneal cancer). ? Prostate. ? Pancreatic.  Cervical, Uterine, and Ovarian Cancer Your health care provider may recommend that you be screened regularly for cancer of the pelvic organs. These include your ovaries, uterus, and vagina. This screening involves a pelvic exam, which includes checking for microscopic changes to the surface of your cervix (Pap test).  For women ages 21-65, health care providers may recommend a pelvic exam and a Pap test every three years. For women ages 25-65, they may recommend the Pap test and pelvic exam, combined with testing for human papilloma virus (HPV), every five years. Some types of HPV increase your risk of cervical cancer. Testing for HPV may also be done on women of any age who have unclear Pap test  results.  Other health care providers may not recommend any screening for nonpregnant women who are considered low risk for pelvic cancer and have no symptoms. Ask your health care provider if a screening pelvic exam is right for you.  If you have had past treatment for cervical cancer or a condition that could lead to cancer, you need Pap tests and screening for cancer for at least 20 years after your treatment. If Pap tests have been discontinued for you, your risk factors (such as having a new sexual partner) need to be reassessed to determine if you should start having screenings again. Some women have medical problems that increase the chance of getting cervical cancer. In these cases, your health care provider may recommend that you have screening and Pap tests more often.  If you have a family history of uterine cancer or ovarian cancer, talk with your health care provider about genetic screening.  If you have vaginal bleeding after reaching menopause, tell your health care provider.  There are currently no reliable tests available to screen for ovarian cancer.  Lung Cancer Lung cancer screening is recommended for adults 64-7 years old who are  at high risk for lung cancer because of a history of smoking. A yearly low-dose CT scan of the lungs is recommended if you:  Currently smoke.  Have a history of at least 30 pack-years of smoking and you currently smoke or have quit within the past 15 years. A pack-year is smoking an average of one pack of cigarettes per day for one year.  Yearly screening should:  Continue until it has been 15 years since you quit.  Stop if you develop a health problem that would prevent you from having lung cancer treatment.  Colorectal Cancer  This type of cancer can be detected and can often be prevented.  Routine colorectal cancer screening usually begins at age 80 and continues through age 69.  If you have risk factors for colon cancer, your health  care provider may recommend that you be screened at an earlier age.  If you have a family history of colorectal cancer, talk with your health care provider about genetic screening.  Your health care provider may also recommend using home test kits to check for hidden blood in your stool.  A small camera at the end of a tube can be used to examine your colon directly (sigmoidoscopy or colonoscopy). This is done to check for the earliest forms of colorectal cancer.  Direct examination of the colon should be repeated every 5-10 years until age 61. However, if early forms of precancerous polyps or small growths are found or if you have a family history or genetic risk for colorectal cancer, you may need to be screened more often.  Skin Cancer  Check your skin from head to toe regularly.  Monitor any moles. Be sure to tell your health care provider: ? About any new moles or changes in moles, especially if there is a change in a mole's shape or color. ? If you have a mole that is larger than the size of a pencil eraser.  If any of your family members has a history of skin cancer, especially at a young age, talk with your health care provider about genetic screening.  Always use sunscreen. Apply sunscreen liberally and repeatedly throughout the day.  Whenever you are outside, protect yourself by wearing long sleeves, pants, a wide-brimmed hat, and sunglasses.  What should I know about osteoporosis? Osteoporosis is a condition in which bone destruction happens more quickly than new bone creation. After menopause, you may be at an increased risk for osteoporosis. To help prevent osteoporosis or the bone fractures that can happen because of osteoporosis, the following is recommended:  If you are 20-8 years old, get at least 1,000 mg of calcium and at least 600 mg of vitamin D per day.  If you are older than age 98 but younger than age 29, get at least 1,200 mg of calcium and at least 600 mg of  vitamin D per day.  If you are older than age 64, get at least 1,200 mg of calcium and at least 800 mg of vitamin D per day.  Smoking and excessive alcohol intake increase the risk of osteoporosis. Eat foods that are rich in calcium and vitamin D, and do weight-bearing exercises several times each week as directed by your health care provider. What should I know about how menopause affects my mental health? Depression may occur at any age, but it is more common as you become older. Common symptoms of depression include:  Low or sad mood.  Changes in sleep patterns.  Changes  in appetite or eating patterns.  Feeling an overall lack of motivation or enjoyment of activities that you previously enjoyed.  Frequent crying spells.  Talk with your health care provider if you think that you are experiencing depression. What should I know about immunizations? It is important that you get and maintain your immunizations. These include:  Tetanus, diphtheria, and pertussis (Tdap) booster vaccine.  Influenza every year before the flu season begins.  Pneumonia vaccine.  Shingles vaccine.  Your health care provider may also recommend other immunizations. This information is not intended to replace advice given to you by your health care provider. Make sure you discuss any questions you have with your health care provider. Document Released: 10/16/2005 Document Revised: 03/13/2016 Document Reviewed: 05/28/2015 Elsevier Interactive Patient Education  2018 [employer].

## 2018-05-01 LAB — URINE CULTURE
MICRO NUMBER: 91009649
SPECIMEN QUALITY: ADEQUATE

## 2018-05-01 LAB — URINALYSIS, MICROSCOPIC ONLY
Bacteria, UA: NONE SEEN /HPF
Hyaline Cast: NONE SEEN /LPF
RBC / HPF: NONE SEEN /HPF (ref 0–2)
Squamous Epithelial / HPF: NONE SEEN /HPF (ref <=5)

## 2018-05-01 NOTE — Assessment & Plan Note (Signed)
Annual comprehensive preventive exam was done as well as an evaluation and management of chronic conditions .  During the course of the visit the patient was educated and counseled about appropriate screening and preventive services including :  diabetes screening, lipid analysis with projected  10 year  risk for CAD , nutrition counseling, breast, cervical and colorectal cancer screening, and recommended immunizations.  Printed recommendations for health maintenance screenings was given 

## 2018-05-01 NOTE — Assessment & Plan Note (Signed)
Currently un managed due to recurrent demonstrated intolerance to  Imuran,  after multiple failed trials (placquenil and Methotrexate) .  She has been referred to [facility] rheum by her dermatologist

## 2018-05-01 NOTE — Assessment & Plan Note (Signed)
Secondary to loss of husband,  Health issues.  Resumed remeron in mid June at 7.5 mg,  encouraged to increase dose to 15 mg qhs but she did not tolerate higher dose.

## 2018-05-01 NOTE — Assessment & Plan Note (Addendum)
Treated in June for Klebsiella UTI with 3 subsequent cultures that were negative .  Repeat UA ordered today is abnormal,  And culture is suggestive of infection with GPC .  Will await speciation and sensitivies.

## 2018-05-01 NOTE — Assessment & Plan Note (Addendum)
Diagnosed with L1 vertebral fracture. She has osteopenia by DEXA with No change by repeat DEXA April 2019 .  Femur T score -1.6  Will repeat in 2 year since she is still taking  Femara.

## 2018-05-01 NOTE — Assessment & Plan Note (Signed)
Managed with compression stockings and every other day use of diuretics.

## 2018-05-01 NOTE — Assessment & Plan Note (Signed)
Patient is dealing with the unexpected loss of her sister , but she has  adequate coping skills and emotional support .  i have asked patient to return in one month to examine for signs of unresolving grief.

## 2018-05-01 NOTE — Assessment & Plan Note (Addendum)
Diagnosed in left breast Feb 2017 during diagnostic mammogram of left breast. Treated with simple mastectomy.  One positive LN> .  Chemo offered but deferred in favor of adjuvant therapy. (Femara).  Annual screening mastectomy has been done in March,  Ordered by [employer].

## 2018-05-03 ENCOUNTER — Telehealth: Payer: Self-pay

## 2018-05-03 MED ORDER — NITROFURANTOIN MACROCRYSTAL 100 MG PO CAPS: 100.0000 mg | ORAL_CAPSULE | Freq: Four times a day (QID) | ORAL | 0 refills | Status: DC

## 2018-05-03 NOTE — Telephone Encounter (Signed)
error 

## 2018-05-03 NOTE — Telephone Encounter (Signed)
-----   Message from [name], MD sent at 05/03/2018  9:57 AM EDT ----- Your urine culture is growing bacteria that should respond to Macrobid if we use a 7 day course.  Med sent to local pharmacy to start today  Take with food ,  And start/continue a daily probiotic.   Let me know if you are having any problems.  Regards,   Deborra Medina, MD

## 2018-05-03 NOTE — Addendum Note (Signed)
Addended by: [name] on: 05/03/2018 09:59 AM   Modules accepted: Orders

## 2018-05-03 NOTE — Telephone Encounter (Signed)
Patient returned call

## 2018-05-03 NOTE — Telephone Encounter (Signed)
Spoke with pt and she stated that she received her mychart message and has picked up the medication and has started taking them.

## 2018-05-03 NOTE — Telephone Encounter (Signed)
Patient called and unable to contact office so nurse can speak to patient, Please call cell CB# [phone]

## 2018-05-03 NOTE — Telephone Encounter (Signed)
LMTCB. Please transfer pt to our office.  

## 2018-05-11 ENCOUNTER — Other Ambulatory Visit: Payer: Self-pay | Admitting: Internal Medicine

## 2018-05-11 ENCOUNTER — Other Ambulatory Visit: Payer: Self-pay | Admitting: Hematology and Oncology

## 2018-05-11 DIAGNOSIS — C50912 Malignant neoplasm of unspecified site of left female breast: Secondary | ICD-10-CM

## 2018-05-11 DIAGNOSIS — Z170 Estrogen receptor positive status [ER+]: Principal | ICD-10-CM

## 2018-05-27 DIAGNOSIS — L931 Subacute cutaneous lupus erythematosus: Secondary | ICD-10-CM | POA: Diagnosis not present

## 2018-06-10 ENCOUNTER — Other Ambulatory Visit: Payer: Self-pay | Admitting: Hematology and Oncology

## 2018-06-10 ENCOUNTER — Encounter: Payer: Self-pay | Admitting: Internal Medicine

## 2018-06-10 ENCOUNTER — Ambulatory Visit (INDEPENDENT_AMBULATORY_CARE_PROVIDER_SITE_OTHER): Payer: PPO | Admitting: Internal Medicine

## 2018-06-10 VITALS — BP 142/70 | HR 84 | Temp 98.2°F | Resp 14 | Ht 63.75 in | Wt 136.6 lb

## 2018-06-10 DIAGNOSIS — R609 Edema, unspecified: Secondary | ICD-10-CM

## 2018-06-10 DIAGNOSIS — E8809 Other disorders of plasma-protein metabolism, not elsewhere classified: Secondary | ICD-10-CM | POA: Diagnosis not present

## 2018-06-10 DIAGNOSIS — Z842 Family history of other diseases of the genitourinary system: Secondary | ICD-10-CM

## 2018-06-10 DIAGNOSIS — F331 Major depressive disorder, recurrent, moderate: Secondary | ICD-10-CM | POA: Diagnosis not present

## 2018-06-10 DIAGNOSIS — L930 Discoid lupus erythematosus: Secondary | ICD-10-CM | POA: Diagnosis not present

## 2018-06-10 DIAGNOSIS — Z66 Do not resuscitate: Secondary | ICD-10-CM

## 2018-06-10 DIAGNOSIS — Z85850 Personal history of malignant neoplasm of thyroid: Secondary | ICD-10-CM

## 2018-06-10 DIAGNOSIS — Z7189 Other specified counseling: Secondary | ICD-10-CM | POA: Diagnosis not present

## 2018-06-10 MED ORDER — NITROFURANTOIN MONOHYD MACRO 100 MG PO CAPS: 100.0000 mg | ORAL_CAPSULE | Freq: Two times a day (BID) | ORAL | 0 refills | Status: DC

## 2018-06-10 NOTE — Patient Instructions (Signed)
We revised your MOST Form today to reflect your wishes to NOT BE RESUSCITATED    I SENT A PRESCRIPTION FOR MACROBID TO YOUR PHARMACY  KEEP IT ON HAND INCASE YOU GET A FEVER .  IF YOUR FEVERS DO NOT RESOLVE,  YOU WILL NEED TO SUBMIT A URINE SPECIMEN

## 2018-06-10 NOTE — Progress Notes (Signed)
Subjective:  Patient ID: [name], female    DOB: [birth year]  Age: 83 y.o. MRN: [id number]  CC: The primary encounter diagnosis was Do not resuscitate status. Diagnoses of Discoid lupus, Edema due to hypoalbuminemia, Major depressive disorder, recurrent episode, moderate ([place]), Family history of UTI (urinary tract infection), and Do not resuscitate discussion were also pertinent to this visit.  HPI [name] presents for follow up on grief/depression and recurrent uti  Treated for enterococcus  In  late august  With macrobid x 7 days .  She was asymptomatic,  Except for subjective fevers. but had requested evaluation due to fear of sepsis and recurrent admission .  She tolerated the antibiotic without diarrhea or stomach upset.  She is mainly worried about future evaluations since she was unable to get an appointment with me urgently in the past.   Discoid Lupus:  She was referred b local derm to Dr Will Bonnet at [facility] given her intolerance of all previously prescribed medications.  Since her rash was improving with topical triamcinolone,  No changes were made.  '  Needs to have TSH drawn during December blood draw at cancer center.   She has requested changes to her MOST form, which was drawn up in 2017.  She has requested to be changed to DNR status. After a comprehensive discussion of the ramifications of changing her directives,  The  MOST form was revised and a copy will be added to chart along with DNR order.   Outpatient Medications Prior to Visit  Medication Sig Dispense Refill  . aspirin 81 MG tablet Take 81 mg by mouth daily.    . Calcium Carbonate-Vitamin D (CALCIUM + D) 600-200 MG-UNIT TABS Take 2 tablets by mouth 2 (two) times daily.     . cholecalciferol (VITAMIN D) 1000 UNITS tablet Take 2,000 Units by mouth daily.     . furosemide (LASIX) 20 MG tablet 1 tablet every other day as needed 30 tablet 3  . letrozole (FEMARA) 2.5 MG tablet Take 1 tablet by mouth once daily  90 tablet 0  . levothyroxine (SYNTHROID, LEVOTHROID) 75 MCG tablet Take 1 tablet (75 mcg total) by mouth daily. 90 tablet 0  . metolazone (ZAROXOLYN) 2.5 MG tablet TAKE 1 TABLET (2.5 MG TOTAL) BY MOUTH DAILY. 30 MINUTES BEFORE FUROSEMIDE DOSE 90 tablet 1  . metoprolol succinate (TOPROL-XL) 25 MG 24 hr tablet Take 1 tablet by mouth daily 90 tablet 0  . mirtazapine (REMERON) 7.5 MG tablet Take 1 tablet by mouth at bedtime (Dose change) 90 tablet 0  . omeprazole (PRILOSEC) 20 MG capsule Take 1 capsule by mouth twice a day before a meal 180 capsule 0  . ondansetron (ZOFRAN ODT) 4 MG disintegrating tablet Take 1 tablet (4 mg total) by mouth every 8 (eight) hours as needed for nausea or vomiting. 20 tablet 0  . polyethylene glycol (MIRALAX / GLYCOLAX) packet Take 17 g by mouth daily.    . Probiotic Product (PROBIOTIC PO) Take 1 tablet by mouth daily.    . tizanidine (ZANAFLEX) 2 MG capsule Take 1 capsule (2 mg total) by mouth 3 (three) times daily. 90 capsule 0  . traMADol (ULTRAM) 50 MG tablet Take 1 tablet (50 mg total) by mouth every 8 (eight) hours as needed. 60 tablet 2  . triamcinolone ointment (KENALOG) 0.1 % Apply 1 application topically daily.  0  . nitrofurantoin (MACRODANTIN) 100 MG capsule Take 1 capsule (100 mg total) by mouth 4 (four) times daily. (  Patient not taking: Reported on 06/10/2018) 14 capsule 0   No facility-administered medications prior to visit.     Review of Systems;  Patient denies headache, fevers, malaise, unintentional weight loss, skin rash, eye pain, sinus congestion and sinus pain, sore throat, dysphagia,  hemoptysis , cough, dyspnea, wheezing, chest pain, palpitations, orthopnea, edema, abdominal pain, nausea, melena, diarrhea, constipation, flank pain, dysuria, hematuria, urinary  Frequency, nocturia, numbness, tingling, seizures,  Focal weakness, Loss of consciousness,  Tremor, insomnia, depression, anxiety, and suicidal ideation.      Objective:  BP (!) 142/70  (BP Location: Left Arm, Patient Position: Sitting, Cuff Size: Normal)   Pulse 84   Temp 98.2 F (36.8 C) (Oral)   Resp 14   Ht 5' 3.75" (1.619 m)   Wt 136 lb 9.6 oz (62 kg)   SpO2 99%   BMI 23.63 kg/m   BP Readings from Last 3 Encounters:  06/10/18 (!) 142/70  04/29/18 116/70  04/27/18 130/74    Wt Readings from Last 3 Encounters:  06/10/18 136 lb 9.6 oz (62 kg)  04/29/18 136 lb 12.8 oz (62.1 kg)  04/27/18 135 lb 9.3 oz (61.5 kg)    General appearance: alert, cooperative and appears stated age Ears: normal TM's and external ear canals both ears Throat: lips, mucosa, and tongue normal; teeth and gums normal Neck: no adenopathy, no carotid bruit, supple, symmetrical, trachea midline and thyroid not enlarged, symmetric, no tenderness/mass/nodules Back: symmetric, no curvature. ROM normal. No CVA tenderness. Lungs: clear to auscultation bilaterally Heart: regular rate and rhythm, S1, S2 normal, no murmur, click, rub or gallop Abdomen: soft, non-tender; bowel sounds normal; no masses,  no organomegaly Pulses: 2+ and symmetric Skin: Skin color, texture, turgor normal. No rashes or lesions Lymph nodes: Cervical, supraclavicular, and axillary nodes normal.  No results found for: HGBA1C  Lab Results  Component Value Date   CREATININE 0.71 04/27/2018   CREATININE 0.75 02/28/2018   CREATININE 0.73 02/23/2018    Lab Results  Component Value Date   WBC 3.2 (L) 04/27/2018   HGB 11.8 (L) 04/27/2018   HCT 34.7 (L) 04/27/2018   PLT 198 04/27/2018   GLUCOSE 129 (H) 04/27/2018   CHOL 170 06/08/2016   TRIG 86.0 06/08/2016   HDL 44.20 06/08/2016   LDLDIRECT 117.6 12/27/2012   LDLCALC 108 (H) 06/08/2016   ALT 6 04/27/2018   AST 21 04/27/2018   NA 137 04/27/2018   K 4.0 04/27/2018   CL 100 04/27/2018   CREATININE 0.71 04/27/2018   BUN 17 04/27/2018   CO2 27 04/27/2018   TSH 0.47 03/31/2018   INR 1.07 02/09/2018    Ct Abdomen Pelvis W Contrast  Result Date:  02/28/2018 CLINICAL DATA:  Fever, nausea, and vomiting. EXAM: CT ABDOMEN AND PELVIS WITH CONTRAST TECHNIQUE: Multidetector CT imaging of the abdomen and pelvis was performed using the standard protocol following bolus administration of intravenous contrast. CONTRAST:  176mL ISOVUE-300 IOPAMIDOL (ISOVUE-300) INJECTION 61% COMPARISON:  PET-CT 12/23/2016 FINDINGS: Lower chest: Minimal atelectasis in the lung bases. No pleural effusion. Normal heart size. Status post left mastectomy. Hepatobiliary: No focal liver abnormality is seen. Status post cholecystectomy. No biliary dilatation. Pancreas: Unremarkable. Spleen: Unremarkable. Adrenals/Urinary Tract: Unremarkable adrenal glands. Subcentimeter low-density lesions in both kidneys, too small to fully characterize. No evidence of renal calculi or hydronephrosis. Unremarkable bladder. Stomach/Bowel: There is a small sliding hiatal hernia. There are multiple fluid-filled loops of small bowel with mildly prominent mucosal enhancement. No bowel dilatation is seen to indicate obstruction.  The appendix may be completely collapsed posterior to the cecum, and there is no evidence of acute appendicitis. Vascular/Lymphatic: Abdominal aortic atherosclerosis without aneurysm. No enlarged lymph nodes. Reproductive: Atrophic uterus and ovaries.  No adnexal mass. Other: No intraperitoneal free fluid or free air. No abdominal wall hernia. Musculoskeletal: Prior augmentation of an L1 compression fracture. No suspicious osseous lesion. IMPRESSION: 1. Scattered fluid-filled small bowel loops with mildly prominent enhancement which may reflect enteritis. No bowel obstruction. 2.  Aortic Atherosclerosis (ICD10-I70.0). Electronically Signed   By: Logan Bores M.D.   On: 02/28/2018 15:18    Assessment & Plan:   Problem List Items Addressed This Visit    Discoid lupus    Currently managed with TAC cream given multiple intolerances.  Seeing Baxter Kail  At [facility]       Do not resuscitate  discussion    Reviewed patient's  MOST  Directives which were orignally done in 2017 .  Patient expresses continues to desire DNR status and has had her MOST directives revised and a DNR order for  prominent  Placement in the home.       Edema due to hypoalbuminemia    Managed with compression stockings and every other day use of diuretics.      Family history of UTI (urinary tract infection)    Patient given rx for macrobid to start if symptoms reminiscent of prior UTI develop.  encouraged to call for appointment prior to starting antibiotic if possible.       Major depressive disorder, recurrent episode, moderate (HCC)    She is sleeping better and feeling better o n current ose of remeron . No changes today        Other Visit Diagnoses    Do not resuscitate status    -  Primary   Relevant Orders   DNR (Do Not Resuscitate)    A total of 40 minutes was spent with patient more than half of which was spent in counseling patient on the above mentioned issues , reviewing and explaining recent labs and imaging studies done, and coordination of care.   I have discontinued Vasilisa R. Hirt's nitrofurantoin. I am also having her start on nitrofurantoin (macrocrystal-monohydrate). Additionally, I am having her maintain her cholecalciferol, Calcium Carbonate-Vitamin D, aspirin, polyethylene glycol, tizanidine, traMADol, furosemide, ondansetron, metolazone, levothyroxine, triamcinolone ointment, Probiotic Product (PROBIOTIC PO), letrozole, omeprazole, metoprolol succinate, and mirtazapine.  Meds ordered this encounter  Medications  . nitrofurantoin, macrocrystal-monohydrate, (MACROBID) 100 MG capsule    Sig: Take 1 capsule (100 mg total) by mouth 2 (two) times daily.    Dispense:  10 capsule    Refill:  0    Medications Discontinued During This Encounter  Medication Reason  . nitrofurantoin (MACRODANTIN) 100 MG capsule Completed Course    Follow-up: Return in about 6 months (around  12/10/2018).   [name], MD

## 2018-06-12 NOTE — Assessment & Plan Note (Signed)
Reviewed patient's  MOST  Directives which were orignally done in 2017 .  Patient expresses continues to desire DNR status and has had her MOST directives revised and a DNR order for  prominent  Placement in the home.

## 2018-06-12 NOTE — Assessment & Plan Note (Signed)
Managed with compression stockings and every other day use of diuretics.

## 2018-06-12 NOTE — Assessment & Plan Note (Signed)
Currently managed with TAC cream given multiple intolerances.  Seeing Mayetta

## 2018-06-12 NOTE — Assessment & Plan Note (Signed)
She is sleeping better and feeling better o n current ose of remeron . No changes today

## 2018-06-12 NOTE — Assessment & Plan Note (Signed)
Patient given rx for macrobid to start if symptoms reminiscent of prior UTI develop.  encouraged to call for appointment prior to starting antibiotic if possible.

## 2018-07-04 ENCOUNTER — Encounter: Payer: Self-pay | Admitting: Internal Medicine

## 2018-07-04 ENCOUNTER — Ambulatory Visit (INDEPENDENT_AMBULATORY_CARE_PROVIDER_SITE_OTHER): Payer: PPO | Admitting: Internal Medicine

## 2018-07-04 VITALS — BP 126/66 | HR 95 | Temp 98.0°F | Resp 16 | Ht 63.75 in | Wt 137.0 lb

## 2018-07-04 DIAGNOSIS — R079 Chest pain, unspecified: Secondary | ICD-10-CM | POA: Diagnosis not present

## 2018-07-04 DIAGNOSIS — R0789 Other chest pain: Secondary | ICD-10-CM

## 2018-07-04 DIAGNOSIS — F418 Other specified anxiety disorders: Secondary | ICD-10-CM | POA: Diagnosis not present

## 2018-07-04 DIAGNOSIS — R300 Dysuria: Secondary | ICD-10-CM

## 2018-07-04 DIAGNOSIS — N390 Urinary tract infection, site not specified: Secondary | ICD-10-CM

## 2018-07-04 LAB — POCT URINALYSIS DIPSTICK
Bilirubin, UA: NEGATIVE
Blood, UA: NEGATIVE
GLUCOSE UA: NEGATIVE
Ketones, UA: NEGATIVE
Nitrite, UA: NEGATIVE
PROTEIN UA: NEGATIVE
SPEC GRAV UA: 1.015 (ref 1.010–1.025)
Urobilinogen, UA: 0.2 E.U./dL
pH, UA: 6 (ref 5.0–8.0)

## 2018-07-04 LAB — URINALYSIS, MICROSCOPIC ONLY

## 2018-07-04 MED ORDER — SERTRALINE HCL 50 MG PO TABS: 50.0000 mg | ORAL_TABLET | Freq: Every day | ORAL | 3 refills | Status: DC

## 2018-07-04 MED ORDER — NITROGLYCERIN 0.4 MG SL SUBL: 0.4000 mg | SUBLINGUAL_TABLET | SUBLINGUAL | 3 refills | Status: DC | PRN

## 2018-07-04 NOTE — Patient Instructions (Addendum)
For your chest pain:  I am prescribing nitroglycerin sublingual tablets.  Put one under your tongue the  next time you have chest pain.  You can repeat the dose after 5 minutes  if the pain has not resolved.  If you need a 3rd dose,  You need to call 911  Referral to Dr. Fletcher Anon is in progress   For your urinary symptoms   If you have a  Urinary tract infection. It  may be still present  And may due to taking the wrong antibiotic last time (when you treated yourself  On Oct 16)   You should ALWAYS submit a urine sample BEFORE you start an antibiotic, because your symptoms may be due to irritation,  Not infection.  We will not be able to distinguish between the two without a urinalysis AND culture     I am recommending a trial of generic Zoloft to improve your mood and help you stop worrying.  Start with 1/2 tablet after dinner.  After one week you may increase the dose to a full tablet.   Continue the mirtazapine at bedtime     Your back pain is due to muscle strain.  Try using a heating pad or ice when it bothers you

## 2018-07-04 NOTE — Progress Notes (Signed)
Subjective:  Patient ID: [name], female    DOB: 27-Feb-1935  Age: 83 y.o. MRN: [id number]  CC: The primary encounter diagnosis was Dysuria. Diagnoses of Chest pain, unspecified type, Recurrent UTI (urinary tract infection), Chest pain of uncertain etiology, and Anxiety about health were also pertinent to this visit.  HPI [name] presents for follow up and treatment of multiple symptoms   1) recurrent dysuria and decreased urinary stream.  Symptoms started yesterday.  POCT UA is positive for leukocytes. Last confirmed UTI was in , but she self treated for similar symptoms on Oct 16 without submitting an untreated sample of urine .  2) 3 nights recently  of nocturnal chest pain described as severe ,  crushing in quality, which radiated  to upper back.  She has known GERD which is treated with a daily PPI, but states that this pain did NOT  feel like prior episodes of reflux.  on one of the 3 nights she took a Tums,   But all  3 episodes resolved in under 5 minutes and were not accompanied by  Diaphoresis, nausea , or shortness of breath.  Thought about calling 911 but did not , and did not "want to bother" her  daughter.  Does  not exercise. Has coronary calcifications  and cardiomegaly  by Jan 2019 chest CT  With a normal appearing  Aorta and no history of hypertension .   3) left sided back pain,  intermittent present for months .  Dull ache . Tender to palpation of lateral obliques    4) anxiety/depression. Mostly around health issues.avoiding social gatherings.    Taking mirtazipine 7.5 mg  Dail, sleeping better , but higher doses  were not tolerated   5)  had an episoe of feeling woozy that occurred after standig up suddenly  Outpatient Medications Prior to Visit  Medication Sig Dispense Refill  . aspirin 81 MG tablet Take 81 mg by mouth daily.    . Calcium Carbonate-Vitamin D (CALCIUM + D) 600-200 MG-UNIT TABS Take 2 tablets by mouth 2 (two) times daily.     .  cholecalciferol (VITAMIN D) 1000 UNITS tablet Take 2,000 Units by mouth daily.     . furosemide (LASIX) 20 MG tablet 1 tablet every other day as needed 30 tablet 3  . letrozole (FEMARA) 2.5 MG tablet Take 1 tablet by mouth once daily 90 tablet 0  . levothyroxine (SYNTHROID, LEVOTHROID) 75 MCG tablet Take 1 tablet (75 mcg total) by mouth daily. 90 tablet 0  . metolazone (ZAROXOLYN) 2.5 MG tablet TAKE 1 TABLET (2.5 MG TOTAL) BY MOUTH DAILY. 30 MINUTES BEFORE FUROSEMIDE DOSE 90 tablet 1  . metoprolol succinate (TOPROL-XL) 25 MG 24 hr tablet Take 1 tablet by mouth daily 90 tablet 0  . mirtazapine (REMERON) 7.5 MG tablet Take 1 tablet by mouth at bedtime (Dose change) 90 tablet 0  . omeprazole (PRILOSEC) 20 MG capsule Take 1 capsule by mouth twice a day before a meal 180 capsule 0  . ondansetron (ZOFRAN ODT) 4 MG disintegrating tablet Take 1 tablet (4 mg total) by mouth every 8 (eight) hours as needed for nausea or vomiting. 20 tablet 0  . polyethylene glycol (MIRALAX / GLYCOLAX) packet Take 17 g by mouth daily.    . Probiotic Product (PROBIOTIC PO) Take 1 tablet by mouth daily.    . tizanidine (ZANAFLEX) 2 MG capsule Take 1 capsule (2 mg total) by mouth 3 (three) times daily. 90 capsule 0  .  traMADol (ULTRAM) 50 MG tablet Take 1 tablet (50 mg total) by mouth every 8 (eight) hours as needed. 60 tablet 2  . triamcinolone ointment (KENALOG) 0.1 % Apply 1 application topically daily.  0  . nitrofurantoin, macrocrystal-monohydrate, (MACROBID) 100 MG capsule Take 1 capsule (100 mg total) by mouth 2 (two) times daily. (Patient not taking: Reported on 07/04/2018) 10 capsule 0   No facility-administered medications prior to visit.     Review of Systems;  Patient denies headache, fevers, malaise, unintentional weight loss, skin rash, eye pain, sinus congestion and sinus pain, sore throat, dysphagia,  hemoptysis , cough, dyspnea, wheezing, chest pain, palpitations, orthopnea, edema, abdominal pain, nausea,  melena, diarrhea, constipation, flank pain, dysuria, hematuria, urinary  Frequency, nocturia, numbness, tingling, seizures,  Focal weakness, Loss of consciousness,  Tremor, insomnia, depression, anxiety, and suicidal ideation.      Objective:  BP 126/66 (BP Location: Left Arm, Patient Position: Sitting, Cuff Size: Normal)   Pulse 95   Temp 98 F (36.7 C) (Oral)   Resp 16   Ht 5' 3.75" (1.619 m)   Wt 137 lb (62.1 kg)   SpO2 98%   BMI 23.70 kg/m   BP Readings from Last 3 Encounters:  07/04/18 126/66  06/10/18 (!) 142/70  04/29/18 116/70    Wt Readings from Last 3 Encounters:  07/04/18 137 lb (62.1 kg)  06/10/18 136 lb 9.6 oz (62 kg)  04/29/18 136 lb 12.8 oz (62.1 kg)    General appearance: alert, cooperative and appears stated age Ears: normal TM's and external ear canals both ears Throat: lips, mucosa, and tongue normal; teeth and gums normal Neck: no adenopathy, no carotid bruit, supple, symmetrical, trachea midline and thyroid not enlarged, symmetric, no tenderness/mass/nodules Back: symmetric, no curvature. ROM normal. No CVA tenderness. Lungs: clear to auscultation bilaterally Heart: regular rate and rhythm, S1, S2 normal, no murmur, click, rub or gallop Abdomen: soft, non-tender; bowel sounds normal; no masses,  no organomegaly Pulses: 2+ and symmetric Skin: Skin color, texture, turgor normal. No rashes or lesions Lymph nodes: Cervical, supraclavicular, and axillary nodes normal.  No results found for: HGBA1C  Lab Results  Component Value Date   CREATININE 0.71 04/27/2018   CREATININE 0.75 02/28/2018   CREATININE 0.73 02/23/2018    Lab Results  Component Value Date   WBC 3.2 (L) 04/27/2018   HGB 11.8 (L) 04/27/2018   HCT 34.7 (L) 04/27/2018   PLT 198 04/27/2018   GLUCOSE 129 (H) 04/27/2018   CHOL 170 06/08/2016   TRIG 86.0 06/08/2016   HDL 44.20 06/08/2016   LDLDIRECT 117.6 12/27/2012   LDLCALC 108 (H) 06/08/2016   ALT 6 04/27/2018   AST 21 04/27/2018    NA 137 04/27/2018   K 4.0 04/27/2018   CL 100 04/27/2018   CREATININE 0.71 04/27/2018   BUN 17 04/27/2018   CO2 27 04/27/2018   TSH 0.47 03/31/2018   INR 1.07 02/09/2018    Ct Abdomen Pelvis W Contrast  Result Date: 02/28/2018 CLINICAL DATA:  Fever, nausea, and vomiting. EXAM: CT ABDOMEN AND PELVIS WITH CONTRAST TECHNIQUE: Multidetector CT imaging of the abdomen and pelvis was performed using the standard protocol following bolus administration of intravenous contrast. CONTRAST:  161m ISOVUE-300 IOPAMIDOL (ISOVUE-300) INJECTION 61% COMPARISON:  PET-CT 12/23/2016 FINDINGS: Lower chest: Minimal atelectasis in the lung bases. No pleural effusion. Normal heart size. Status post left mastectomy. Hepatobiliary: No focal liver abnormality is seen. Status post cholecystectomy. No biliary dilatation. Pancreas: Unremarkable. Spleen: Unremarkable. Adrenals/Urinary Tract: Unremarkable  adrenal glands. Subcentimeter low-density lesions in both kidneys, too small to fully characterize. No evidence of renal calculi or hydronephrosis. Unremarkable bladder. Stomach/Bowel: There is a small sliding hiatal hernia. There are multiple fluid-filled loops of small bowel with mildly prominent mucosal enhancement. No bowel dilatation is seen to indicate obstruction. The appendix may be completely collapsed posterior to the cecum, and there is no evidence of acute appendicitis. Vascular/Lymphatic: Abdominal aortic atherosclerosis without aneurysm. No enlarged lymph nodes. Reproductive: Atrophic uterus and ovaries.  No adnexal mass. Other: No intraperitoneal free fluid or free air. No abdominal wall hernia. Musculoskeletal: Prior augmentation of an L1 compression fracture. No suspicious osseous lesion. IMPRESSION: 1. Scattered fluid-filled small bowel loops with mildly prominent enhancement which may reflect enteritis. No bowel obstruction. 2.  Aortic Atherosclerosis (ICD10-I70.0). Electronically Signed   By: Logan Bores M.D.    On: 02/28/2018 15:18    Assessment & Plan:   Problem List Items Addressed This Visit    Anxiety about health    Adding 50 mg sertraline,  Continue  remeron at low dose for insomnia.       Relevant Medications   sertraline (ZOLOFT) 50 MG tablet   Chest pain of uncertain etiology    Cardiac risk factors include coronary calcifications noted on prior CT of chest , previous XRT to left chest wall during treatment for BRCA ,  unresolved Inflammatory condition (Sjogren's), and age. Her only workup to date is an ECHO that was done in June during admission for sepsis.  Cardiology referral made.  EKG today is unchanged from prior. Sublingual ntg rx given with instructions       Recurrent UTI (urinary tract infection)    Emphasized need for objective data given concurrent atrophic vaginitis . Urine culture is pending . She is quite anxious about delaying treatment given past hospitalizatios for sepsis syndrome        Other Visit Diagnoses    Dysuria    -  Primary   Relevant Orders   POCT Urinalysis Dipstick (Completed)   Urine Culture   Urine Microscopic Only (Completed)   Chest pain, unspecified type       Relevant Orders   Ambulatory referral to Cardiology   EKG 12-Lead (Completed)    A total of 40 minutes was spent with patient more than half of which was spent in counseling patient on the above mentioned issues , reviewing and explaining recent labs and imaging studies done, and coordination of care.  I have discontinued Ninel R. Pech's nitrofurantoin (macrocrystal-monohydrate). I am also having her start on nitroGLYCERIN and sertraline. Additionally, I am having her maintain her cholecalciferol, Calcium Carbonate-Vitamin D, aspirin, polyethylene glycol, tizanidine, traMADol, furosemide, ondansetron, metolazone, levothyroxine, triamcinolone ointment, Probiotic Product (PROBIOTIC PO), letrozole, omeprazole, metoprolol succinate, and mirtazapine.  Meds ordered this encounter    Medications  . nitroGLYCERIN (NITROSTAT) 0.4 MG SL tablet    Sig: Place 1 tablet (0.4 mg total) under the tongue every 5 (five) minutes as needed for chest pain. Maximum 3 doses    Dispense:  50 tablet    Refill:  3  . sertraline (ZOLOFT) 50 MG tablet    Sig: Take 1 tablet (50 mg total) by mouth daily.    Dispense:  30 tablet    Refill:  3    Medications Discontinued During This Encounter  Medication Reason  . nitrofurantoin, macrocrystal-monohydrate, (MACROBID) 100 MG capsule Completed Course    Follow-up: Return in about 3 months (around 10/04/2018).   [name],  MD 

## 2018-07-05 DIAGNOSIS — R4589 Other symptoms and signs involving emotional state: Secondary | ICD-10-CM | POA: Insufficient documentation

## 2018-07-05 DIAGNOSIS — F418 Other specified anxiety disorders: Secondary | ICD-10-CM | POA: Insufficient documentation

## 2018-07-05 NOTE — Assessment & Plan Note (Signed)
Adding 50 mg sertraline,  Continue  remeron at low dose for insomnia.

## 2018-07-05 NOTE — Assessment & Plan Note (Addendum)
Cardiac risk factors include coronary calcifications noted on prior CT of chest , previous XRT to left chest wall during treatment for BRCA ,  unresolved Inflammatory condition (Sjogren's), and age. Her only workup to date is an ECHO that was done in June during admission for sepsis.  Cardiology referral made.  EKG today is unchanged from prior. Sublingual ntg rx given with instructions

## 2018-07-05 NOTE — Assessment & Plan Note (Signed)
Emphasized need for objective data given concurrent atrophic vaginitis . Urine culture is pending . She is quite anxious about delaying treatment given past hospitalizatios for sepsis syndrome

## 2018-07-06 LAB — URINE CULTURE
MICRO NUMBER:: 91293302
SPECIMEN QUALITY:: ADEQUATE

## 2018-07-07 ENCOUNTER — Telehealth: Payer: Self-pay | Admitting: Internal Medicine

## 2018-07-07 ENCOUNTER — Other Ambulatory Visit: Payer: Self-pay | Admitting: Internal Medicine

## 2018-07-07 DIAGNOSIS — N390 Urinary tract infection, site not specified: Secondary | ICD-10-CM

## 2018-07-07 MED ORDER — CIPROFLOXACIN HCL 250 MG PO TABS: 250.0000 mg | ORAL_TABLET | Freq: Two times a day (BID) | ORAL | 0 refills | Status: DC

## 2018-07-07 NOTE — Telephone Encounter (Signed)
Copied from [place] [phone]. Topic: Quick Communication - See Telephone Encounter >> Jul 07, 2018 11:26 AM Ahmed Prima L wrote: CRM for notification. See Telephone encounter for: 07/07/18.  Patient is calling to see if the culture is back yet from her urine specimen she did on Monday. She was advised that she did have a UTI but Dr Derrel Nip advised her she wanted to wait and see what the culture was before prescribing medication. Please advise.

## 2018-07-07 NOTE — Telephone Encounter (Signed)
Culture has not been resulted yet, need to hold message

## 2018-07-08 NOTE — Telephone Encounter (Signed)
Spoke with pt yesterday evening in regards to urine results. See result note message.

## 2018-07-26 ENCOUNTER — Other Ambulatory Visit: Payer: Self-pay | Admitting: Internal Medicine

## 2018-07-26 DIAGNOSIS — E034 Atrophy of thyroid (acquired): Secondary | ICD-10-CM

## 2018-07-26 NOTE — Telephone Encounter (Signed)
Copied from [place] [phone]. Topic: Quick Communication - See Telephone Encounter >> Jul 26, 2018 12:53 PM Conception Chancy, NT wrote: CRM for notification. See Telephone encounter for: 07/26/18.  Kim Santiago is calling and requesting a refill on levothyroxine (SYNTHROID, LEVOTHROID) 75 MCG tablet for a 90 day supply. Also is requesting authorization to dispense the [employer] Please advise.   Fax# [phone] Cb# [phone]   Okay to leave VM.

## 2018-07-28 ENCOUNTER — Other Ambulatory Visit: Payer: Self-pay | Admitting: Internal Medicine

## 2018-07-28 ENCOUNTER — Other Ambulatory Visit: Payer: Self-pay

## 2018-07-28 DIAGNOSIS — E034 Atrophy of thyroid (acquired): Secondary | ICD-10-CM

## 2018-07-28 DIAGNOSIS — N390 Urinary tract infection, site not specified: Secondary | ICD-10-CM

## 2018-07-28 MED ORDER — LEVOTHYROXINE SODIUM 75 MCG PO TABS: 75.0000 ug | ORAL_TABLET | Freq: Every day | ORAL | 0 refills | Status: DC

## 2018-07-28 NOTE — Telephone Encounter (Signed)
Last office visit 04/29/18 chronic  Next office visit 10/04/18

## 2018-07-29 ENCOUNTER — Other Ambulatory Visit
Admission: RE | Admit: 2018-07-29 | Discharge: 2018-07-29 | Disposition: A | Discharge status: Home / Self Care | Payer: PPO | Source: Ambulatory Visit | Attending: Urology | Admitting: Urology

## 2018-07-29 ENCOUNTER — Other Ambulatory Visit: Payer: Self-pay

## 2018-07-29 ENCOUNTER — Encounter: Payer: Self-pay | Admitting: Urology

## 2018-07-29 ENCOUNTER — Ambulatory Visit (INDEPENDENT_AMBULATORY_CARE_PROVIDER_SITE_OTHER): Payer: PPO | Admitting: Urology

## 2018-07-29 VITALS — BP 155/74 | HR 87 | Wt 136.0 lb

## 2018-07-29 DIAGNOSIS — N390 Urinary tract infection, site not specified: Secondary | ICD-10-CM | POA: Diagnosis not present

## 2018-07-29 DIAGNOSIS — N952 Postmenopausal atrophic vaginitis: Secondary | ICD-10-CM | POA: Diagnosis not present

## 2018-07-29 DIAGNOSIS — I214 Non-ST elevation (NSTEMI) myocardial infarction: Secondary | ICD-10-CM | POA: Insufficient documentation

## 2018-07-29 DIAGNOSIS — I219 Acute myocardial infarction, unspecified: Secondary | ICD-10-CM | POA: Insufficient documentation

## 2018-07-29 NOTE — Addendum Note (Signed)
Addended by: Milbert Coulter on: 07/29/2018 08:12 AM   Modules accepted: Orders

## 2018-07-29 NOTE — Progress Notes (Signed)
07/29/2018 11:17 AM   Kim Santiago Feb 20, 1935 [id number]  Referring provider: [name], MD [place], [place] [address]  Chief Complaint  Patient presents with  . Recurrent UTI    HPI: 83 yo F who presents today for further evaluation of recurrent UTIs.    She reports 5 infections over the past 6 months.  She was admitted 02/2018 for sepsis presenting only with rigors and fever, discharge and almost immediately readmitted for ongoing symptoms.  She denies any significant bladder symptoms including no urgency or burning at that time..   She has had multiple infections over the past 6 months.  She had Klebsiella 02/2018, enterococcus 04/2018, E. Coli (pansensitive) 06/2018.    She was also seen in the emergency room on 02/28/2018 later that month with your GI complaints and fevers.  CT scan was most consistent with enteritis.  She was treated at that time again for presumed UTI although her culture was negative.  On the last 2 occasions, she was just checked for an infection at her request without any specific symptoms of urinary tract infection.  She is treated on both occasions.  She does report that occasionally, she does have lower urinary tract symptoms with dull dysuria and some urgency.  This is not been on every occasion as outlined above.  She was advised by her primary care physician to start a probiotic for 2 weeks but has continue this medication.  She has a personal history of breast cancer on letrozole starting 2 years ago.   She does endorse vaginal dryness.  She is not currently sexually active, widowed.    She does have recent upper tract imaging in the form of CT abdomen pelvis with contrast on 02/28/2018 which was negative without evidence of GU pathology.  PMH: Past Medical History:  Diagnosis Date  . Arthritis   . Breast cancer ([place]) 2017   left mastectomy done 11/2015  . Breast cancer in female [facility]) 11/18/2015   Left: 3.9  cm tumor, T2, 1/2 sentinel nodes positive for macro metastatic disease, N1, 3 negative nodes in the axillary tail, ER+,PR+, Her 2 neu, low Mammoprint score  . Cancer [facility])    thyroid takes levothyroxine  . Chronic kidney disease    UTI  . Genetic screening March 2017.   Mammoprint of left breast cancer: Low risk for recurrence.   . Hypertension   . hypothyroidism    secondary to thyroidectomy for thyroid ca  . Hypothyroidism   . Lupus ([facility])    subcutaneous  . Menopause 40s   natural, hot flashes and mood lability now gone, off prempro 7 months  . Myocardial infarction ([place]) 2013  . Osteoporosis    Osteopenia  . Rosacea   . Sjoegren syndrome   . Stress-induced cardiomyopathy September of 2013   EF 35%. Peak troponin was 1.8.    Surgical History: Past Surgical History:  Procedure Laterality Date  . BACK SURGERY    . BREAST BIOPSY Left 10/30/15   positive, done in Dr. Dwyane Luo office  . CARDIAC CATHETERIZATION  05/2012   [facility]. No significant CAD. Ejection fraction of 35% due to stress-induced cardiomyopathy.  . CHOLECYSTECTOMY    . COLONOSCOPY    . DILATION AND CURETTAGE OF UTERUS    . KYPHOSIS SURGERY  Feb 2008   L1, Dr. Mauri Pole  . LUMBAR DISC SURGERY     L4-L5  . MASTECTOMY Left 11/18/2015   positive  . SENTINEL NODE BIOPSY  Left 11/18/2015   Procedure: SENTINEL NODE BIOPSY;  Surgeon: Robert Bellow, MD;  Location: [facility] ORS;  Service: General;  Laterality: Left;  . SHOULDER ARTHROSCOPY  2004   Left, Dr. Jefm Bryant  . SIMPLE MASTECTOMY WITH AXILLARY SENTINEL NODE BIOPSY Left 11/18/2015   Procedure: SIMPLE MASTECTOMY;  Surgeon: Robert Bellow, MD;  Location: [facility] ORS;  Service: General;  Laterality: Left;  . SPINE SURGERY     L4-5 diskectomy  . THYROIDECTOMY     Thyroid Cancer  . TONSILLECTOMY    . TUBAL LIGATION      Home Medications:  Allergies as of 07/29/2018      Reactions   Azathioprine Nausea And Vomiting   Severe vomiting   Mycophenolate Mofetil Nausea  Only   Amoxicillin Rash   Has patient had a PCN reaction causing immediate rash, facial/tongue/throat swelling, SOB or lightheadedness with hypotension: Unknown Has patient had a PCN reaction causing severe rash involving mucus membranes or skin necrosis: Unknown Has patient had a PCN reaction that required hospitalization: Unknown Has patient had a PCN reaction occurring within the last 10 years: Unknown If all of the above answers are "NO", then may proceed with Cephalosporin use. Tolerates ceftriaxone and Ancef   Codeine Nausea And Vomiting   Naprosyn [naproxen] Swelling   Orudis [ketoprofen] Hives   Plaquenil [hydroxychloroquine] Hives   Sulfathiazole Rash      Medication List        Accurate as of 07/29/18 11:17 AM. Always use your most recent med list.          aspirin 81 MG tablet Take 81 mg by mouth daily.   Calcium Carbonate-Vitamin D 600-200 MG-UNIT Tabs Take 2 tablets by mouth 2 (two) times daily.   cholecalciferol 1000 units tablet Commonly known as:  VITAMIN D Take 2,000 Units by mouth daily.   furosemide 20 MG tablet Commonly known as:  LASIX 1 tablet every other day as needed   letrozole 2.5 MG tablet Commonly known as:  FEMARA Take 1 tablet by mouth once daily   levothyroxine 75 MCG tablet Commonly known as:  SYNTHROID, LEVOTHROID Take 1 tablet (75 mcg total) by mouth daily.   metolazone 2.5 MG tablet Commonly known as:  ZAROXOLYN TAKE 1 TABLET (2.5 MG TOTAL) BY MOUTH DAILY. 30 MINUTES BEFORE FUROSEMIDE DOSE   metoprolol succinate 25 MG 24 hr tablet Commonly known as:  TOPROL-XL Take 1 tablet by mouth daily   mirtazapine 7.5 MG tablet Commonly known as:  REMERON Take 1 tablet by mouth at bedtime (Dose change)   nitroGLYCERIN 0.4 MG SL tablet Commonly known as:  NITROSTAT Place 1 tablet (0.4 mg total) under the tongue every 5 (five) minutes as needed for chest pain. Maximum 3 doses   omeprazole 20 MG capsule Commonly known as:   PRILOSEC Take 1 capsule by mouth twice a day before a meal   ondansetron 4 MG disintegrating tablet Commonly known as:  ZOFRAN-ODT Take 1 tablet (4 mg total) by mouth every 8 (eight) hours as needed for nausea or vomiting.   polyethylene glycol packet Commonly known as:  MIRALAX / GLYCOLAX Take 17 g by mouth daily.   PROBIOTIC PO Take 1 tablet by mouth daily.   sertraline 50 MG tablet Commonly known as:  ZOLOFT TAKE 1 TABLET BY MOUTH EVERY DAY   tizanidine 2 MG capsule Commonly known as:  ZANAFLEX Take 1 capsule (2 mg total) by mouth 3 (three) times daily.   traMADol 50 MG tablet Commonly  known as:  ULTRAM Take 1 tablet (50 mg total) by mouth every 8 (eight) hours as needed.   triamcinolone ointment 0.1 % Commonly known as:  KENALOG Apply 1 application topically daily.       Allergies:  Allergies  Allergen Reactions  . Azathioprine Nausea And Vomiting    Severe vomiting  . Mycophenolate Mofetil Nausea Only  . Amoxicillin Rash    Has patient had a PCN reaction causing immediate rash, facial/tongue/throat swelling, SOB or lightheadedness with hypotension: Unknown Has patient had a PCN reaction causing severe rash involving mucus membranes or skin necrosis: Unknown Has patient had a PCN reaction that required hospitalization: Unknown Has patient had a PCN reaction occurring within the last 10 years: Unknown If all of the above answers are "NO", then may proceed with Cephalosporin use. Tolerates ceftriaxone and Ancef  . Codeine Nausea And Vomiting  . Naprosyn [Naproxen] Swelling  . Orudis [Ketoprofen] Hives  . Plaquenil [Hydroxychloroquine] Hives  . Sulfathiazole Rash    Family History: Family History  Problem Relation Age of Onset  . COPD Father   . Cancer Father        esophageal  . Kidney disease Mother   . Heart disease Mother   . Kidney disease Sister   . Cancer Brother 41       colon cancer (both brothers)  . Heart attack Brother 14  . Heart disease  Brother   . Breast cancer Neg Hx     Social History:  reports that she has never smoked. She has never used smokeless tobacco. She reports that she does not drink alcohol or use drugs.  ROS: UROLOGY Frequent Urination?: No Hard to postpone urination?: No Burning/pain with urination?: No Get up at night to urinate?: No Leakage of urine?: No Urine stream starts and stops?: No Trouble starting stream?: No Do you have to strain to urinate?: No Blood in urine?: No Urinary tract infection?: No Sexually transmitted disease?: No Injury to kidneys or bladder?: No Painful intercourse?: No Weak stream?: No Currently pregnant?: No Vaginal bleeding?: No Last menstrual period?: n  Gastrointestinal Nausea?: No Vomiting?: No Indigestion/heartburn?: No Diarrhea?: No Constipation?: No  Constitutional Fever: No Night sweats?: No Weight loss?: No Fatigue?: Yes  Skin Skin rash/lesions?: No Itching?: No  Eyes Blurred vision?: No Double vision?: No  Ears/Nose/Throat Sore throat?: No Sinus problems?: No  Hematologic/Lymphatic Swollen glands?: No Easy bruising?: No  Cardiovascular Leg swelling?: Yes Chest pain?: No  Respiratory Cough?: No Shortness of breath?: No  Endocrine Excessive thirst?: No  Musculoskeletal Back pain?: No Joint pain?: No  Neurological Headaches?: No Dizziness?: No  Psychologic Depression?: No Anxiety?: No  Physical Exam: BP (!) 155/74   Pulse 87   Wt 136 lb (61.7 kg)   BMI 23.53 kg/m   Constitutional:  Alert and oriented, No acute distress. HEENT: [place] AT, moist mucus membranes.  Trachea midline, no masses. Cardiovascular: No clubbing, cyanosis, or edema. Respiratory: Normal respiratory effort, no increased work of breathing. GI: Abdomen is soft, nontender, nondistended, no abdominal masses GU: No CVA tenderness Pelvic: Normal external genitalia.  Atrophic vaginal tissue appreciated with stage I cystocele with Valsalva, excellent  apical support.  No rectocele.  Patent urethral meatus with minimal hypermobility.  No demonstrable stress urinary incontinence. Skin: No rashes, bruises or suspicious lesions. Neurologic: Grossly intact, no focal deficits, moving all 4 extremities. Psychiatric: Normal mood and affect.  Laboratory Data: Lab Results  Component Value Date   WBC 3.2 (L) 04/27/2018  HGB 11.8 (L) 04/27/2018   HCT 34.7 (L) 04/27/2018   MCV 95.1 04/27/2018   PLT 198 04/27/2018    Lab Results  Component Value Date   CREATININE 0.71 04/27/2018    Urinalysis Asymptomatic today thus UA was not obtained on purpose  Pertinent Imaging: CT abdomen pelvis with contrast from 02/28/2018 personally reviewed today  Assessment & Plan:    1. Recurrent UTI Extensive review of records today as well as history do in fact show history of urinary tract infection in June which was severe with sepsis secondary to Klebsiella All subsequent infections appear to be more consistent with bacterial colonization without any lower urinary tract symptoms or systemic symptoms We discussed the difference between bacterial colonization (asymptomatic bacteriuria) and a true lower urinary tract symptoms requiring treatment We discussed AUA guidelines on diagnosis and treatment of recurrent urinary tract infections I purposefully did not collect a urinalysis today given that she is currently asymptomatic I have encouraged her to come to our office for same-day drop off for UA/urine culture if she has signs or symptoms of urinary tract infection which are reviewed in detail today and typed out for her which include dysuria, urgency, frequency, lower abdominal pain, fevers, altered mental status or fatigue malaise I have encouraged continuation of daily probiotic In addition to this, would recommend cranberry tablets either twice daily or prescription strength cranberry tablets in the form of Ellura  2. Atrophic vaginitis Significant dried  vaginal dryness appreciated This is likely exacerbated by letrozole Given her personal history of breast cancer, topical estrogen is contraindicated  Follow-up as needed  Hollice Espy, MD  [facility] [address], [place], [place] [address] [phone]  I spent 45 min with this patient of which greater than 50% was spent in counseling and coordination of care with the patient.

## 2018-07-29 NOTE — Patient Instructions (Signed)
Probiotics daily  Cranberry tablets at least 2x daily, consider higher strength Ellura ([employer])  Call for same day visit for any concern for UTI (symptoms of fever, bladder pain, burring, urgency/ frequency NOT color or odor)

## 2018-08-01 ENCOUNTER — Other Ambulatory Visit: Payer: Self-pay | Admitting: Internal Medicine

## 2018-08-01 DIAGNOSIS — E034 Atrophy of thyroid (acquired): Secondary | ICD-10-CM

## 2018-08-01 NOTE — Telephone Encounter (Signed)
Copied from [place] [id number]. Topic: Quick Communication - See Telephone Encounter >> Aug 01, 2018  2:32 PM Ivar Drape wrote: CRM for notification. See Telephone encounter for: 08/01/18. Patient stated that her preferred pharmacy [facility] said they did not receive her levothyroxine (SYNTHROID, LEVOTHROID) 75 MCG tablet refill prescription. She also stated she is completely out of her medication.

## 2018-08-02 ENCOUNTER — Other Ambulatory Visit: Payer: Self-pay | Admitting: Internal Medicine

## 2018-08-02 MED ORDER — LEVOTHYROXINE SODIUM 75 MCG PO TABS: 75.0000 ug | ORAL_TABLET | Freq: Every day | ORAL | 0 refills | Status: DC

## 2018-08-03 ENCOUNTER — Other Ambulatory Visit: Payer: Self-pay | Admitting: Urgent Care

## 2018-08-03 DIAGNOSIS — Z170 Estrogen receptor positive status [ER+]: Principal | ICD-10-CM

## 2018-08-03 DIAGNOSIS — C50912 Malignant neoplasm of unspecified site of left female breast: Secondary | ICD-10-CM

## 2018-08-03 MED ORDER — LETROZOLE 2.5 MG PO TABS: 2.5000 mg | ORAL_TABLET | Freq: Every day | ORAL | 3 refills | Status: DC

## 2018-08-15 ENCOUNTER — Other Ambulatory Visit: Payer: Self-pay | Admitting: Internal Medicine

## 2018-08-15 DIAGNOSIS — E034 Atrophy of thyroid (acquired): Secondary | ICD-10-CM

## 2018-08-24 ENCOUNTER — Inpatient Hospital Stay: Payer: PPO | Attending: Hematology and Oncology | Admitting: Hematology and Oncology

## 2018-08-24 ENCOUNTER — Inpatient Hospital Stay: Payer: PPO

## 2018-08-24 ENCOUNTER — Encounter: Payer: Self-pay | Admitting: Hematology and Oncology

## 2018-08-24 VITALS — BP 127/78 | HR 81 | Temp 96.4°F | Resp 18 | Wt 135.4 lb

## 2018-08-24 DIAGNOSIS — M85851 Other specified disorders of bone density and structure, right thigh: Secondary | ICD-10-CM | POA: Diagnosis not present

## 2018-08-24 DIAGNOSIS — E032 Hypothyroidism due to medicaments and other exogenous substances: Secondary | ICD-10-CM

## 2018-08-24 DIAGNOSIS — Z170 Estrogen receptor positive status [ER+]: Secondary | ICD-10-CM

## 2018-08-24 DIAGNOSIS — E039 Hypothyroidism, unspecified: Secondary | ICD-10-CM | POA: Diagnosis not present

## 2018-08-24 DIAGNOSIS — M85852 Other specified disorders of bone density and structure, left thigh: Secondary | ICD-10-CM | POA: Insufficient documentation

## 2018-08-24 DIAGNOSIS — L930 Discoid lupus erythematosus: Secondary | ICD-10-CM | POA: Insufficient documentation

## 2018-08-24 DIAGNOSIS — M3500 Sicca syndrome, unspecified: Secondary | ICD-10-CM | POA: Diagnosis not present

## 2018-08-24 DIAGNOSIS — Z8249 Family history of ischemic heart disease and other diseases of the circulatory system: Secondary | ICD-10-CM | POA: Insufficient documentation

## 2018-08-24 DIAGNOSIS — I252 Old myocardial infarction: Secondary | ICD-10-CM | POA: Diagnosis not present

## 2018-08-24 DIAGNOSIS — R0789 Other chest pain: Secondary | ICD-10-CM | POA: Insufficient documentation

## 2018-08-24 DIAGNOSIS — M2550 Pain in unspecified joint: Secondary | ICD-10-CM | POA: Insufficient documentation

## 2018-08-24 DIAGNOSIS — C50912 Malignant neoplasm of unspecified site of left female breast: Secondary | ICD-10-CM | POA: Diagnosis not present

## 2018-08-24 DIAGNOSIS — Z802 Family history of malignant neoplasm of other respiratory and intrathoracic organs: Secondary | ICD-10-CM | POA: Insufficient documentation

## 2018-08-24 DIAGNOSIS — E890 Postprocedural hypothyroidism: Secondary | ICD-10-CM

## 2018-08-24 DIAGNOSIS — Z800 Family history of malignant neoplasm of digestive organs: Secondary | ICD-10-CM | POA: Insufficient documentation

## 2018-08-24 DIAGNOSIS — Z79811 Long term (current) use of aromatase inhibitors: Secondary | ICD-10-CM | POA: Insufficient documentation

## 2018-08-24 DIAGNOSIS — D649 Anemia, unspecified: Secondary | ICD-10-CM | POA: Diagnosis not present

## 2018-08-24 DIAGNOSIS — R12 Heartburn: Secondary | ICD-10-CM | POA: Insufficient documentation

## 2018-08-24 DIAGNOSIS — Z85850 Personal history of malignant neoplasm of thyroid: Secondary | ICD-10-CM

## 2018-08-24 DIAGNOSIS — Z9012 Acquired absence of left breast and nipple: Secondary | ICD-10-CM

## 2018-08-24 DIAGNOSIS — Z79899 Other long term (current) drug therapy: Secondary | ICD-10-CM | POA: Insufficient documentation

## 2018-08-24 DIAGNOSIS — R590 Localized enlarged lymph nodes: Secondary | ICD-10-CM | POA: Insufficient documentation

## 2018-08-24 DIAGNOSIS — D72819 Decreased white blood cell count, unspecified: Secondary | ICD-10-CM | POA: Insufficient documentation

## 2018-08-24 LAB — COMPREHENSIVE METABOLIC PANEL
ALT: 7 U/L (ref 0–44)
AST: 22 U/L (ref 15–41)
Albumin: 3.5 g/dL (ref 3.5–5.0)
Alkaline Phosphatase: 77 U/L (ref 38–126)
Anion gap: 8 (ref 5–15)
BUN: 16 mg/dL (ref 8–23)
CO2: 29 mmol/L (ref 22–32)
Calcium: 8.8 mg/dL — ABNORMAL LOW (ref 8.9–10.3)
Chloride: 102 mmol/L (ref 98–111)
Creatinine, Ser: 0.74 mg/dL (ref 0.44–1.00)
GFR calc Af Amer: >60 mL/min (ref >60)
GFR calc non Af Amer: >60 mL/min (ref >60)
Glucose, Bld: 106 mg/dL — ABNORMAL HIGH (ref 70–99)
Potassium: 4 mmol/L (ref 3.5–5.1)
Sodium: 139 mmol/L (ref 135–145)
Total Bilirubin: 0.6 mg/dL (ref 0.3–1.2)
Total Protein: 6.6 g/dL (ref 6.5–8.1)

## 2018-08-24 LAB — CBC WITH DIFFERENTIAL/PLATELET
Abs Immature Granulocytes: 0.02 10*3/uL (ref 0.00–0.07)
Basophils Absolute: 0.1 10*3/uL (ref 0.0–0.1)
Basophils Relative: 2 %
Eosinophils Absolute: 0.1 10*3/uL (ref 0.0–0.5)
Eosinophils Relative: 5 %
HCT: 32 % — ABNORMAL LOW (ref 36.0–46.0)
Hemoglobin: 10.5 g/dL — ABNORMAL LOW (ref 12.0–15.0)
Immature Granulocytes: 1 %
Lymphocytes Relative: 37 %
Lymphs Abs: 1.1 10*3/uL (ref 0.7–4.0)
MCH: 32.4 pg (ref 26.0–34.0)
MCHC: 32.8 g/dL (ref 30.0–36.0)
MCV: 98.8 fL (ref 80.0–100.0)
Monocytes Absolute: 0.4 10*3/uL (ref 0.1–1.0)
Monocytes Relative: 13 %
Neutro Abs: 1.3 10*3/uL — ABNORMAL LOW (ref 1.7–7.7)
Neutrophils Relative %: 42 %
Platelets: 157 10*3/uL (ref 150–400)
RBC: 3.24 MIL/uL — ABNORMAL LOW (ref 3.87–5.11)
RDW: 14.8 % (ref 11.5–15.5)
WBC: 2.9 10*3/uL — ABNORMAL LOW (ref 4.0–10.5)
nRBC: 0 % (ref 0.0–0.2)

## 2018-08-24 NOTE — Progress Notes (Signed)
[facility] day:  08/24/2018   Chief Complaint: Kim Santiago is a 83 y.o. female with stage IIB left breast cancer who is seen for 4 month assessment on Femara.  HPI:  The patient was last seen in the medical oncology clinic on 04/27/2018.  At that time, she was doing well overall from an oncology stand point. She struggled to control her cutaneous lupus; not currently on intervention. She denied any B symptoms or breast concerns. Exam was stable. B12 and folate were normal.  CA27.29 was 33.2.  She was seen by Dr. Baxter Kail in dermatology at [facility] for subacute cutaneous lupus erythematosus.  Notes reviewed.  An aggressive topical regimen was to be continued.  She has a follow-up in 4 - 6 months.  During the interim, she has felt "pretty good".  She denies any breast concerns.  She notes that the ointment is "doing a fairly good job".  She is taking her calcium and vitamin D faithfully.  She notes that she needs a dental extraction (left upper tooth).  Prolia is thus on hold.  She wakes up at night with chest pressure for which she is seeing Dr. Fletcher Anon in cardiology.   Past Medical History:  Diagnosis Date  . Arthritis   . Breast cancer ([place]) 2017   left mastectomy done 11/2015  . Breast cancer in female [facility]) 11/18/2015   Left: 3.9 cm tumor, T2, 1/2 sentinel nodes positive for macro metastatic disease, N1, 3 negative nodes in the axillary tail, ER+,PR+, Her 2 neu, low Mammoprint score  . Cancer [facility])    thyroid takes levothyroxine  . Chronic kidney disease    UTI  . Genetic screening March 2017.   Mammoprint of left breast cancer: Low risk for recurrence.   . Hypertension   . hypothyroidism    secondary to thyroidectomy for thyroid ca  . Hypothyroidism   . Lupus ([facility])    subcutaneous  . Menopause 40s   natural, hot flashes and mood lability now gone, off prempro 7 months  . Myocardial infarction ([place]) 2013  . Osteoporosis     Osteopenia  . Rosacea   . Sjoegren syndrome   . Stress-induced cardiomyopathy September of 2013   EF 35%. Peak troponin was 1.8.    Past Surgical History:  Procedure Laterality Date  . BACK SURGERY    . BREAST BIOPSY Left 10/30/15   positive, done in Dr. Dwyane Luo office  . CARDIAC CATHETERIZATION  05/2012   [facility]. No significant CAD. Ejection fraction of 35% due to stress-induced cardiomyopathy.  . CHOLECYSTECTOMY    . COLONOSCOPY    . DILATION AND CURETTAGE OF UTERUS    . KYPHOSIS SURGERY  Feb 2008   L1, Dr. Mauri Pole  . LUMBAR DISC SURGERY     L4-L5  . MASTECTOMY Left 11/18/2015   positive  . SENTINEL NODE BIOPSY Left 11/18/2015   Procedure: SENTINEL NODE BIOPSY;  Surgeon: Robert Bellow, MD;  Location: [facility] ORS;  Service: General;  Laterality: Left;  . SHOULDER ARTHROSCOPY  2004   Left, Dr. Jefm Bryant  . SIMPLE MASTECTOMY WITH AXILLARY SENTINEL NODE BIOPSY Left 11/18/2015   Procedure: SIMPLE MASTECTOMY;  Surgeon: Robert Bellow, MD;  Location: [facility] ORS;  Service: General;  Laterality: Left;  . SPINE SURGERY     L4-5 diskectomy  . THYROIDECTOMY     Thyroid Cancer  . TONSILLECTOMY    . TUBAL LIGATION      Family History  Problem Relation Age of Onset  . COPD Father   . Cancer Father        esophageal  . Kidney disease Mother   . Heart disease Mother   . Kidney disease Sister   . Cancer Brother 70       colon cancer (both brothers)  . Heart attack Brother 60  . Heart disease Brother   . Breast cancer Neg Hx     Social History:  reports that she has never smoked. She has never used smokeless tobacco. She reports that she does not drink alcohol or use drugs.  She is from [place], [place].  She was married for 71 years.  Her husband died 63 months ago.  She lives alone in [place].  She has an adopted daughter and 2 grandchildren.  Her brother died of a MI following a cholecystectomy in 12/2015.  The patient is alone today.  Allergies:  Allergies  Allergen Reactions   . Azathioprine Nausea And Vomiting    Severe vomiting  . Hydroxychloroquine Hives and Nausea And Vomiting  . Mycophenolate Mofetil Nausea Only  . Amoxicillin Rash    Has patient had a PCN reaction causing immediate rash, facial/tongue/throat swelling, SOB or lightheadedness with hypotension: Unknown Has patient had a PCN reaction causing severe rash involving mucus membranes or skin necrosis: Unknown Has patient had a PCN reaction that required hospitalization: Unknown Has patient had a PCN reaction occurring within the last 10 years: Unknown If all of the above answers are "NO", then may proceed with Cephalosporin use. Tolerates ceftriaxone and Ancef  . Codeine Nausea And Vomiting  . Naprosyn [Naproxen] Swelling  . Orudis [Ketoprofen] Hives  . Sulfathiazole Rash    Current Medications: Current Outpatient Medications  Medication Sig Dispense Refill  . Calcium Carbonate-Vitamin D (CALCIUM + D) 600-200 MG-UNIT TABS Take 2 tablets by mouth 2 (two) times daily.     . cholecalciferol (VITAMIN D) 1000 UNITS tablet Take 2,000 Units by mouth daily.     Marland Kitchen CRANBERRY PO Take 1 capsule by mouth 2 (two) times daily.    Marland Kitchen letrozole (FEMARA) 2.5 MG tablet Take 1 tablet (2.5 mg total) by mouth daily. 90 tablet 3  . levothyroxine (SYNTHROID, LEVOTHROID) 75 MCG tablet Take 1 tablet by mouth (61mg total) daily 90 tablet 0  . metoprolol succinate (TOPROL-XL) 25 MG 24 hr tablet Take 1 tablet by mouth daily 90 tablet 0  . omeprazole (PRILOSEC) 20 MG capsule Take 1 capsule by mouth twice a day before a meal 180 capsule 0  . polyethylene glycol (MIRALAX / GLYCOLAX) packet Take 17 g by mouth daily.    . Probiotic Product (PROBIOTIC PO) Take 1 tablet by mouth daily.    .Marland Kitchentriamcinolone ointment (KENALOG) 0.1 % Apply 1 application topically daily.  0  . aspirin EC 81 MG EC tablet Take 1 tablet (81 mg total) by mouth daily. 180 tablet 0  . atorvastatin (LIPITOR) 20 MG tablet Take 1 tablet (20 mg total) by mouth  daily at 6 PM. 90 tablet 0  . clopidogrel (PLAVIX) 75 MG tablet Take 75 mg by mouth daily.    . furosemide (LASIX) 20 MG tablet 1 tablet every other day as needed 30 tablet 3  . losartan (COZAAR) 50 MG tablet Take 1 tablet (50 mg total) by mouth daily. 90 tablet 0  . metolazone (ZAROXOLYN) 2.5 MG tablet TAKE 1 TABLET (2.5 MG TOTAL) BY MOUTH DAILY. 30 MINUTES BEFORE FUROSEMIDE DOSE 90 tablet 1  .  mirtazapine (REMERON) 7.5 MG tablet Take 1 tablet (7.5 mg total) by mouth at bedtime. 90 tablet 0  . nitroGLYCERIN (NITROSTAT) 0.4 MG SL tablet Place 1 tablet (0.4 mg total) under the tongue every 5 (five) minutes as needed for chest pain. Maximum 3 doses 50 tablet 3  . ondansetron (ZOFRAN ODT) 4 MG disintegrating tablet Take 1 tablet (4 mg total) by mouth every 8 (eight) hours as needed for nausea or vomiting. 20 tablet 0  . sertraline (ZOLOFT) 50 MG tablet TAKE 1 TABLET BY MOUTH EVERY DAY 90 tablet 2  . sodium fluoride (DENTA 5000 PLUS) 1.1 % CREA dental cream Take 1 application by mouth daily.    . tizanidine (ZANAFLEX) 2 MG capsule Take 1 capsule (2 mg total) by mouth 3 (three) times daily. 90 capsule 0  . traMADol (ULTRAM) 50 MG tablet Take 1 tablet (50 mg total) by mouth every 8 (eight) hours as needed. 60 tablet 2   No current facility-administered medications for this visit.     Review of Systems  Constitutional: Negative for chills, diaphoresis, fever, malaise/fatigue and weight loss (stable).       Feels "pretty good".  HENT: Negative for congestion, ear discharge, ear pain, nosebleeds, sinus pain and sore throat.        Xerostomia due to Sjogren's.  Tooth extraction needed.  Eyes: Negative for double vision, photophobia and pain.       Dry eyes r/t Sjogren's.  Respiratory: Negative.  Negative for cough, hemoptysis, sputum production and shortness of breath.   Cardiovascular: Negative.  Negative for chest pain, palpitations, orthopnea, leg swelling and PND.       Intermittent chest pressure  at night.  Gastrointestinal: Positive for heartburn. Negative for abdominal pain, blood in stool, constipation, diarrhea, melena, nausea and vomiting.  Genitourinary: Negative.  Negative for dysuria, frequency, hematuria and urgency.  Musculoskeletal: Positive for joint pain. Negative for back pain, falls, myalgias and neck pain.  Skin: Positive for rash (cutaneous lupus). Negative for itching.  Neurological: Negative.  Negative for dizziness, tremors, sensory change, speech change, focal weakness, weakness and headaches.  Endo/Heme/Allergies: Does not bruise/bleed easily.       HYPOthyroidism on levothyroxine.  Psychiatric/Behavioral: Negative.  Negative for depression and memory loss. The patient is not nervous/anxious and does not have insomnia.   All other systems reviewed and are negative.  Performance status (ECOG): 1  Vital Signs BP 127/78 (BP Location: Right Arm, Patient Position: Sitting)   Pulse 81   Temp (!) 96.4 F (35.8 C) (Tympanic)   Resp 18   Wt 135 lb 5.8 oz (61.4 kg)   BMI 23.42 kg/m   Physical Exam  Constitutional: She is oriented to person, place, and time and well-developed, well-nourished, and in no distress. No distress.  HENT:  Head: Normocephalic and atraumatic.  Mouth/Throat: No oropharyngeal exudate.  Short gray hair.  Dry mouth.  Eyes: Pupils are equal, round, and reactive to light. Conjunctivae and EOM are normal. No scleral icterus.  Blue rimmed glasses.  Brown eyes.  Neck: Normal range of motion. Neck supple. No JVD present.  Cardiovascular: Normal rate, regular rhythm and intact distal pulses. Exam reveals no gallop and no friction rub.  No murmur heard. Pulmonary/Chest: Effort normal and breath sounds normal. No respiratory distress. She has no wheezes. She has no rales. Right breast exhibits no inverted nipple, no mass, no nipple discharge and no skin change. Breasts are asymmetrical (LEFT mastectomy.  No erythema or nodularity.).  Abdominal: Soft.  Bowel sounds are normal. She exhibits no distension and no mass. There is no abdominal tenderness. There is no rebound and no guarding.  Musculoskeletal:        General: No tenderness or edema.  Lymphadenopathy:    She has no cervical adenopathy.    She has no axillary adenopathy.       Right: No inguinal and no supraclavicular adenopathy present.       Left: No inguinal and no supraclavicular adenopathy present.  Neurological: She is alert and oriented to person, place, and time. Gait normal.  Skin: Skin is warm and dry. Rash (cutaneous lupus; 2 chest lesions) noted. She is not diaphoretic. No erythema. No pallor.  Psychiatric: Mood, affect and judgment normal.  Nursing note and vitals reviewed.   Labs: Appointment on 08/24/2018  Component Date Value Ref Range Status  . TSH 08/24/2018 0.428  0.350 - 4.500 uIU/mL Final   Comment: Performed by a 3rd Generation assay with a functional sensitivity of <=0.01 uIU/mL. Performed at [facility], [address], [place], [place] [address]   . CA 27.29 08/24/2018 31.3  0.0 - 38.6 U/mL Final   Comment: (NOTE) Siemens Centaur Immunochemiluminometric Methodology [facility]) Values obtained with different assay methods or kits cannot be used interchangeably. Results cannot be interpreted as absolute evidence of the presence or absence of malignant disease. Performed At: [facility] [place], [place] [id number] Rush Farmer MD LY:[id number]   . Copper 08/24/2018 115  72 - 166 ug/dL Final   Comment: (NOTE) This test was developed and its performance characteristics determined by [facility]. It has not been cleared or approved by the Food and Drug Administration.                                Detection Limit = 5 Performed At: [facility] [place], [place] [id number] Rush Farmer MD VC:[id number]   . Sodium 08/24/2018 139  135 - 145 mmol/L Final  . Potassium 08/24/2018 4.0  3.5 - 5.1  mmol/L Final  . Chloride 08/24/2018 102  98 - 111 mmol/L Final  . CO2 08/24/2018 29  22 - 32 mmol/L Final  . Glucose, Bld 08/24/2018 106* 70 - 99 mg/dL Final  . BUN 08/24/2018 16  8 - 23 mg/dL Final  . Creatinine, Ser 08/24/2018 0.74  0.44 - 1.00 mg/dL Final  . Calcium 08/24/2018 8.8* 8.9 - 10.3 mg/dL Final  . Total Protein 08/24/2018 6.6  6.5 - 8.1 g/dL Final  . Albumin 08/24/2018 3.5  3.5 - 5.0 g/dL Final  . AST 08/24/2018 22  15 - 41 U/L Final  . ALT 08/24/2018 7  0 - 44 U/L Final  . Alkaline Phosphatase 08/24/2018 77  38 - 126 U/L Final  . Total Bilirubin 08/24/2018 0.6  0.3 - 1.2 mg/dL Final  . GFR calc non Af Amer 08/24/2018 >60  >60 mL/min Final  . GFR calc Af Amer 08/24/2018 >60  >60 mL/min Final  . Anion gap 08/24/2018 8  5 - 15 Final   Performed at [facility], [address] [place],  [address]  . WBC 08/24/2018 2.9* 4.0 - 10.5 K/uL Final  . RBC 08/24/2018 3.24* 3.87 - 5.11 MIL/uL Final  . Hemoglobin 08/24/2018 10.5* 12.0 - 15.0 g/dL Final  . HCT 08/24/2018 32.0* 36.0 - 46.0 % Final  . MCV 08/24/2018 98.8  80.0 - 100.0 fL Final  .  MCH 08/24/2018 32.4  26.0 - 34.0 pg Final  . MCHC 08/24/2018 32.8  30.0 - 36.0 g/dL Final  . RDW 08/24/2018 14.8  11.5 - 15.5 % Final  . Platelets 08/24/2018 157  150 - 400 K/uL Final  . nRBC 08/24/2018 0.0  0.0 - 0.2 % Final  . Neutrophils Relative % 08/24/2018 42  % Final  . Neutro Abs 08/24/2018 1.3* 1.7 - 7.7 K/uL Final  . Lymphocytes Relative 08/24/2018 37  % Final  . Lymphs Abs 08/24/2018 1.1  0.7 - 4.0 K/uL Final  . Monocytes Relative 08/24/2018 13  % Final  . Monocytes Absolute 08/24/2018 0.4  0.1 - 1.0 K/uL Final  . Eosinophils Relative 08/24/2018 5  % Final  . Eosinophils Absolute 08/24/2018 0.1  0.0 - 0.5 K/uL Final  . Basophils Relative 08/24/2018 2  % Final  . Basophils Absolute 08/24/2018 0.1  0.0 - 0.1 K/uL Final  . Immature Granulocytes 08/24/2018 1  % Final  . Abs Immature Granulocytes 08/24/2018 0.02   0.00 - 0.07 K/uL Final   Performed at [facility], [address] [place], [place] [address]     Assessment:  CERYS WINGET is a 83 y.o. female with stage IIB left breast cancer s/p mastectomy on 11/18/2015.  Pathology revealed a 3.9 cm grade II invasive mammary carcinoma.  There was ductal carcinoma in situ (DCIS).  There was lymphvascular and dermal lymphatic invasion.  Margins were negative.  One of 2 sentinel lymph nodes were positive for macrometastasis.  In addition, there were 3 benign axillary lymph nodes (total 5 lymph nodes).  Tumor was estrogen receptor positive (100%), progesterone receptor positive (70%), and Her2/neu negative.  Pathologic stage was T2N1a.   Bilateral mammogram and ultrasound on 11/11/2015 revealed a 3 cm left breast mass at the 6 o'clock position (1 cm from the nipple) and a 1.6 cm irregular hypoechoic left breast mass at 1 o'clock position (5 cm from the nipple) with overlying skin thickening.  Right mammogram on 11/18/2016 revealed no evidence of malignancy.  Right mammogram on 11/23/2017 revealed no evidence of malignancy.  MammaPrint on 12/18/2015 revealed a low risk of recurrence with a 5% risk of distant recurrence of 5 years and a 10% recurrence at 10 years.  She declined radiation.  She began Femara on 01/09/2016.  She has a little joint pain and a few hot flashes.  PET scan on 12/23/2016 revealed slightly enlarged right lower paratracheal lymph node and some clustered small AP window lymph nodes with metabolic activity very minimally above background in the mediastinum.  There were no other findings of hypermetabolic lesions in the neck, chest, abdomen, or pelvis.  Chest CT on 03/16/2017 revealed stable mild mediastinal lymphadenopathy.  Largest node was 1.0 cm in the right paratracheal region.  There was no new or progressive disease within thorax.  Chest CT on 09/23/2017 revealed stable mild mediastinal adenopathy (1.1 cm precarinal and 0.9 cm  pretracheal lymph node).  There was no new or progressive disease.  CA27.29 has been followed: 30.4 on 11/15/2015, 32.5 on 05/13/2016, 19.0 on 08/12/2016, 40.8 on 11/11/2016, 45.8 on 12/14/2016, 31.6 on 03/17/2017, 45.7 on 07/14/2017, 43.4 on 12/08/2017, 33.2 on 04/27/2018, and 31.3 on 08/24/2018.  Bone density on 12/05/2015 revealed osteopenia with a T-score of -1.5 in the left femoral neck.  Bone density on 12/07/2017 revealed osteopenia with a T-score of -1.6 in the right femoral neck.  She is on calcium and vitamin D.  She has a history  of a myocardial infarction in 06/2012.  Echocardiogram revealed an EF of 35%.  Follow-up echo on 09/21/2015 revealed an EF of 55-60%.  Patient is unaware of a history of heart failure.   She has a history of thyroid carcinoma 40 years ago treated with I-131.   She has a mild normocytic anemia.  Diet is good.  Colonoscopy on 07/25/2013 revealed 3 small polyps (21m proximal descending colon, 3 mm splenic flexure, 1 mm ascending colon).  She denies any melena or hematochezia.  TSH was normal on 12/26/2015 and 03/31/2018.  She has cutaneous lupus and Sjogren's.  She had a reaction to Plaquenil.  She completed an extended steroid taper. She is on methotrexate 8 tablets every Wednesday (increased 3-4 weeks ago).  Symptomatically, she feels "pretty good".  She has active cutaneous lupus.  Breast exam is normal.  Plan: 1. Labs today:  CBC with diff, CMP, CA27.29, TSH, copper. 2. Stage IIB left breast cancer Clinically doing well. CA27.29 is normal. Continue Femara. Right sided mammogram on 11/24/2018 (ordered by Dr. BBary Castilla. Discuss consideration of BCI testing. 3. Leukopenia - stable  WBC 2900 (ANC 1300). Possibly related to lupus. Check TSH and copper today. No interval infections. 4. Mediastinal adenopathy Chest CT on 09/26/2018 5. Osteopenia Patient on calcium and vitamin D. Prolia on hold secondary to upcoming dental extraction.  6. Cutaneous  lupus Patient utilzing topicals [facility]. RTC in 3 months for labs (CBC with diff). 8. RTC in 6 months for MD assess, labs (CBC with diff, CMP, CA27.29), review chest CT and mammogram.    MLequita Asal MD  08/24/2018, 4:35 PM

## 2018-08-24 NOTE — Progress Notes (Signed)
Patient offers no complaints today.  Patient has recently seen urologist and put on cranberry capsules 2 X daily.

## 2018-08-25 LAB — TSH: TSH: 0.428 u[IU]/mL (ref 0.350–4.500)

## 2018-08-25 LAB — CANCER ANTIGEN 27.29: CA 27.29: 31.3 U/mL (ref 0.0–38.6)

## 2018-08-26 LAB — COPPER, SERUM: Copper: 115 ug/dL (ref 72–166)

## 2018-09-12 ENCOUNTER — Telehealth: Payer: Self-pay

## 2018-09-12 NOTE — Telephone Encounter (Signed)
Spoke with patient to reschedule 1/10 ov with Dr. Fletcher Anon due to schedule change .  She declined to be seen sooner than March by another provider.  She has a hx with and wants to see Dr. Fletcher Anon   Added to waitlist

## 2018-09-16 ENCOUNTER — Ambulatory Visit: Payer: PPO | Admitting: Cardiovascular Disease

## 2018-09-23 ENCOUNTER — Ambulatory Visit (INDEPENDENT_AMBULATORY_CARE_PROVIDER_SITE_OTHER): Payer: PPO | Admitting: Urology

## 2018-09-23 ENCOUNTER — Other Ambulatory Visit: Payer: Self-pay

## 2018-09-23 ENCOUNTER — Other Ambulatory Visit
Admission: RE | Admit: 2018-09-23 | Discharge: 2018-09-23 | Disposition: A | Discharge status: Home / Self Care | Payer: PPO | Attending: Urology | Admitting: Urology

## 2018-09-23 ENCOUNTER — Encounter: Payer: Self-pay | Admitting: Urology

## 2018-09-23 VITALS — BP 143/73 | HR 101 | Ht 64.0 in | Wt 132.0 lb

## 2018-09-23 DIAGNOSIS — N390 Urinary tract infection, site not specified: Secondary | ICD-10-CM

## 2018-09-23 DIAGNOSIS — N3000 Acute cystitis without hematuria: Secondary | ICD-10-CM | POA: Diagnosis not present

## 2018-09-23 LAB — URINALYSIS, COMPLETE (UACMP) WITH MICROSCOPIC
BILIRUBIN URINE: NEGATIVE
Glucose, UA: NEGATIVE mg/dL
Ketones, ur: NEGATIVE mg/dL
NITRITE: NEGATIVE
Protein, ur: NEGATIVE mg/dL
SPECIFIC GRAVITY, URINE: 1.01 (ref 1.005–1.030)
pH: 6 (ref 5.0–8.0)

## 2018-09-23 LAB — BLADDER SCAN AMB NON-IMAGING

## 2018-09-23 MED ORDER — CIPROFLOXACIN HCL 500 MG PO TABS: 500.0000 mg | ORAL_TABLET | Freq: Two times a day (BID) | ORAL | 0 refills | Status: DC

## 2018-09-23 NOTE — Progress Notes (Signed)
09/23/2018 6:54 PM   Kim Santiago Dec 23, 1934 [id number]  Referring provider: [name], MD [place], [place] [address]  Chief Complaint  Patient presents with  . Recurrent UTI    follow up    HPI: 84 year old female with a personal history of breast cancer who returns today with several days of UTI-like symptoms.  She reports that she has been having urinary urgency frequency, low back pain and burning both with urination as well as external over the past several days.  No associated fevers or chills.  No nausea or vomiting.  No flank pain.  Continues to be followed by Dr. Mike Gip on letrozole.  Please see previous notes for details.  PMH: Past Medical History:  Diagnosis Date  . Arthritis   . Breast cancer ([place]) 2017   left mastectomy done 11/2015  . Breast cancer in female [facility]) 11/18/2015   Left: 3.9 cm tumor, T2, 1/2 sentinel nodes positive for macro metastatic disease, N1, 3 negative nodes in the axillary tail, ER+,PR+, Her 2 neu, low Mammoprint score  . Cancer [facility])    thyroid takes levothyroxine  . Chronic kidney disease    UTI  . Genetic screening March 2017.   Mammoprint of left breast cancer: Low risk for recurrence.   . Hypertension   . hypothyroidism    secondary to thyroidectomy for thyroid ca  . Hypothyroidism   . Lupus ([facility])    subcutaneous  . Menopause 40s   natural, hot flashes and mood lability now gone, off prempro 7 months  . Myocardial infarction ([place]) 2013  . Osteoporosis    Osteopenia  . Rosacea   . Sjoegren syndrome   . Stress-induced cardiomyopathy September of 2013   EF 35%. Peak troponin was 1.8.    Surgical History: Past Surgical History:  Procedure Laterality Date  . BACK SURGERY    . BREAST BIOPSY Left 10/30/15   positive, done in Dr. Dwyane Luo office  . CARDIAC CATHETERIZATION  05/2012   [facility]. No significant CAD. Ejection fraction of 35% due to stress-induced cardiomyopathy.  .  CHOLECYSTECTOMY    . COLONOSCOPY    . DILATION AND CURETTAGE OF UTERUS    . KYPHOSIS SURGERY  Feb 2008   L1, Dr. Mauri Pole  . LUMBAR DISC SURGERY     L4-L5  . MASTECTOMY Left 11/18/2015   positive  . SENTINEL NODE BIOPSY Left 11/18/2015   Procedure: SENTINEL NODE BIOPSY;  Surgeon: Robert Bellow, MD;  Location: [facility] ORS;  Service: General;  Laterality: Left;  . SHOULDER ARTHROSCOPY  2004   Left, Dr. Jefm Bryant  . SIMPLE MASTECTOMY WITH AXILLARY SENTINEL NODE BIOPSY Left 11/18/2015   Procedure: SIMPLE MASTECTOMY;  Surgeon: Robert Bellow, MD;  Location: [facility] ORS;  Service: General;  Laterality: Left;  . SPINE SURGERY     L4-5 diskectomy  . THYROIDECTOMY     Thyroid Cancer  . TONSILLECTOMY    . TUBAL LIGATION      Home Medications:  Allergies as of 09/23/2018      Reactions   Azathioprine Nausea And Vomiting   Severe vomiting   Mycophenolate Mofetil Nausea Only   Amoxicillin Rash   Has patient had a PCN reaction causing immediate rash, facial/tongue/throat swelling, SOB or lightheadedness with hypotension: Unknown Has patient had a PCN reaction causing severe rash involving mucus membranes or skin necrosis: Unknown Has patient had a PCN reaction that required hospitalization: Unknown Has patient had a PCN reaction occurring within the  last 10 years: Unknown If all of the above answers are "NO", then may proceed with Cephalosporin use. Tolerates ceftriaxone and Ancef   Codeine Nausea And Vomiting   Naprosyn [naproxen] Swelling   Orudis [ketoprofen] Hives   Plaquenil [hydroxychloroquine] Hives   Sulfathiazole Rash      Medication List       Accurate as of September 23, 2018 11:59 PM. Always use your most recent med list.        aspirin 81 MG tablet Take 81 mg by mouth daily.   Calcium Carbonate-Vitamin D 600-200 MG-UNIT Tabs Take 2 tablets by mouth 2 (two) times daily.   cholecalciferol 1000 units tablet Commonly known as:  VITAMIN D Take 2,000 Units by mouth  daily.   ciprofloxacin 500 MG tablet Commonly known as:  CIPRO Take 1 tablet (500 mg total) by mouth 2 (two) times daily.   CRANBERRY PO Take 1 capsule by mouth 2 (two) times daily.   furosemide 20 MG tablet Commonly known as:  LASIX 1 tablet every other day as needed   letrozole 2.5 MG tablet Commonly known as:  FEMARA Take 1 tablet (2.5 mg total) by mouth daily.   levothyroxine 75 MCG tablet Commonly known as:  SYNTHROID, LEVOTHROID Take 1 tablet by mouth (42mcg total) daily   metolazone 2.5 MG tablet Commonly known as:  ZAROXOLYN TAKE 1 TABLET (2.5 MG TOTAL) BY MOUTH DAILY. 30 MINUTES BEFORE FUROSEMIDE DOSE   metoprolol succinate 25 MG 24 hr tablet Commonly known as:  TOPROL-XL Take 1 tablet by mouth daily   mirtazapine 7.5 MG tablet Commonly known as:  REMERON Take 1 tablet by mouth at bedtime   nitroGLYCERIN 0.4 MG SL tablet Commonly known as:  NITROSTAT Place 1 tablet (0.4 mg total) under the tongue every 5 (five) minutes as needed for chest pain. Maximum 3 doses   omeprazole 20 MG capsule Commonly known as:  PRILOSEC Take 1 capsule by mouth twice a day before a meal   ondansetron 4 MG disintegrating tablet Commonly known as:  ZOFRAN ODT Take 1 tablet (4 mg total) by mouth every 8 (eight) hours as needed for nausea or vomiting.   polyethylene glycol packet Commonly known as:  MIRALAX / GLYCOLAX Take 17 g by mouth daily.   PROBIOTIC PO Take 1 tablet by mouth daily.   sertraline 50 MG tablet Commonly known as:  ZOLOFT TAKE 1 TABLET BY MOUTH EVERY DAY   tizanidine 2 MG capsule Commonly known as:  ZANAFLEX Take 1 capsule (2 mg total) by mouth 3 (three) times daily.   traMADol 50 MG tablet Commonly known as:  ULTRAM Take 1 tablet (50 mg total) by mouth every 8 (eight) hours as needed.   triamcinolone ointment 0.1 % Commonly known as:  KENALOG Apply 1 application topically daily.       Allergies:  Allergies  Allergen Reactions  . Azathioprine  Nausea And Vomiting    Severe vomiting  . Mycophenolate Mofetil Nausea Only  . Amoxicillin Rash    Has patient had a PCN reaction causing immediate rash, facial/tongue/throat swelling, SOB or lightheadedness with hypotension: Unknown Has patient had a PCN reaction causing severe rash involving mucus membranes or skin necrosis: Unknown Has patient had a PCN reaction that required hospitalization: Unknown Has patient had a PCN reaction occurring within the last 10 years: Unknown If all of the above answers are "NO", then may proceed with Cephalosporin use. Tolerates ceftriaxone and Ancef  . Codeine Nausea And Vomiting  .  Naprosyn [Naproxen] Swelling  . Orudis [Ketoprofen] Hives  . Plaquenil [Hydroxychloroquine] Hives  . Sulfathiazole Rash    Family History: Family History  Problem Relation Age of Onset  . COPD Father   . Cancer Father        esophageal  . Kidney disease Mother   . Heart disease Mother   . Kidney disease Sister   . Cancer Brother 83       colon cancer (both brothers)  . Heart attack Brother 78  . Heart disease Brother   . Breast cancer Neg Hx     Social History:  reports that she has never smoked. She has never used smokeless tobacco. She reports that she does not drink alcohol or use drugs.  ROS: 12 point review of systems negative other than as per HPI  Physical Exam: BP (!) 143/73   Pulse (!) 101   Ht 5\' 4"  (1.626 m)   Wt 132 lb (59.9 kg)   BMI 22.66 kg/m   Constitutional:  Alert and oriented, No acute distress. HEENT: [place] AT, moist mucus membranes.  Trachea midline, no masses. Cardiovascular: No clubbing, cyanosis, or edema. Respiratory: Normal respiratory effort, no increased work of breathing. Skin: No rashes, bruises or suspicious lesions. Neurologic: Grossly intact, no focal deficits, moving all 4 extremities. Psychiatric: Normal mood and affect.  Laboratory Data: Lab Results  Component Value Date   WBC 3.1 (L) 10/04/2018   HGB 12.2  10/04/2018   HCT 35.0 (L) 10/04/2018   MCV 96.3 10/04/2018   PLT 155.0 10/04/2018    Lab Results  Component Value Date   CREATININE 0.66 10/04/2018    Urinalysis Results for orders placed or performed during the hospital encounter of 09/23/18  Urine Culture  Result Value Ref Range   Specimen Description      URINE, CLEAN CATCH Performed at [facility], [address], [place], [place] [address]    Special Requests      NONE Performed at [facility], [address], [place], [place] [address]    Culture 60,000 COLONIES/mL SERRATIA MARCESCENS (A)    Report Status 09/26/2018 FINAL    Organism ID, Bacteria SERRATIA MARCESCENS (A)       Susceptibility   Serratia marcescens - MIC*    CEFAZOLIN >=64 RESISTANT Resistant     CEFTRIAXONE <=1 SENSITIVE Sensitive     CIPROFLOXACIN <=0.25 SENSITIVE Sensitive     GENTAMICIN <=1 SENSITIVE Sensitive     NITROFURANTOIN 256 RESISTANT Resistant     TRIMETH/SULFA <=20 SENSITIVE Sensitive     * 60,000 COLONIES/mL SERRATIA MARCESCENS  Urinalysis, Complete w Microscopic  Result Value Ref Range   Color, Urine STRAW (A) YELLOW   APPearance HAZY (A) CLEAR   Specific Gravity, Urine 1.010 1.005 - 1.030   pH 6.0 5.0 - 8.0   Glucose, UA NEGATIVE NEGATIVE mg/dL   Hgb urine dipstick TRACE (A) NEGATIVE   Bilirubin Urine NEGATIVE NEGATIVE   Ketones, ur NEGATIVE NEGATIVE mg/dL   Protein, ur NEGATIVE NEGATIVE mg/dL   Nitrite NEGATIVE NEGATIVE   Leukocytes, UA SMALL (A) NEGATIVE   Squamous Epithelial / LPF 0-5 0 - 5   WBC, UA 21-50 0 - 5 WBC/hpf   RBC / HPF 0-5 0 - 5 RBC/hpf   Bacteria, UA RARE (A) NONE SEEN    Assessment & Plan:    1. Recurrent UTI Recurrent UTIs, symptomatic likely perpetuated by vaginal dryness/letrozole Continue cranberry tablets twice daily-transition to prescription strength [place] if  continues to have infections recurrently Daily probiotics Given that she continued to present to urology  either our office in [facility] or [place] for UA/urine culture Consider cystoscopy if she continues to have infections to rule out underlying pathology although not suspected  2. Acute cystitis without hematuria Symptoms and urinalysis consistent with symptomatic UTI Based on previous culture and sensitivity data, will treat with cipro x 7 days    Return if symptoms worsen or fail to improve.  Hollice Espy, MD  [facility] [address], [place], [place] [address] [phone]

## 2018-09-26 ENCOUNTER — Ambulatory Visit
Admission: RE | Admit: 2018-09-26 | Discharge: 2018-09-26 | Disposition: A | Discharge status: Home / Self Care | Payer: PPO | Source: Ambulatory Visit | Attending: Hematology and Oncology | Admitting: Hematology and Oncology

## 2018-09-26 DIAGNOSIS — R918 Other nonspecific abnormal finding of lung field: Secondary | ICD-10-CM | POA: Diagnosis not present

## 2018-09-26 DIAGNOSIS — C50912 Malignant neoplasm of unspecified site of left female breast: Secondary | ICD-10-CM | POA: Diagnosis not present

## 2018-09-26 DIAGNOSIS — R590 Localized enlarged lymph nodes: Secondary | ICD-10-CM

## 2018-09-26 DIAGNOSIS — Z170 Estrogen receptor positive status [ER+]: Secondary | ICD-10-CM | POA: Insufficient documentation

## 2018-09-26 LAB — URINE CULTURE: Culture: 60000 — AB

## 2018-10-04 ENCOUNTER — Ambulatory Visit (INDEPENDENT_AMBULATORY_CARE_PROVIDER_SITE_OTHER): Payer: PPO | Admitting: Internal Medicine

## 2018-10-04 ENCOUNTER — Encounter: Payer: Self-pay | Admitting: Internal Medicine

## 2018-10-04 VITALS — BP 142/78 | HR 78 | Temp 98.1°F | Resp 16 | Ht 64.0 in | Wt 136.4 lb

## 2018-10-04 DIAGNOSIS — E8809 Other disorders of plasma-protein metabolism, not elsewhere classified: Secondary | ICD-10-CM

## 2018-10-04 DIAGNOSIS — E032 Hypothyroidism due to medicaments and other exogenous substances: Secondary | ICD-10-CM | POA: Diagnosis not present

## 2018-10-04 DIAGNOSIS — I252 Old myocardial infarction: Secondary | ICD-10-CM

## 2018-10-04 DIAGNOSIS — R609 Edema, unspecified: Secondary | ICD-10-CM | POA: Diagnosis not present

## 2018-10-04 DIAGNOSIS — N390 Urinary tract infection, site not specified: Secondary | ICD-10-CM

## 2018-10-04 DIAGNOSIS — Z23 Encounter for immunization: Secondary | ICD-10-CM | POA: Diagnosis not present

## 2018-10-04 DIAGNOSIS — D692 Other nonthrombocytopenic purpura: Secondary | ICD-10-CM

## 2018-10-04 DIAGNOSIS — L930 Discoid lupus erythematosus: Secondary | ICD-10-CM | POA: Diagnosis not present

## 2018-10-04 LAB — CBC WITH DIFFERENTIAL/PLATELET
BASOS ABS: 0 10*3/uL (ref 0.0–0.1)
Basophils Relative: 1.5 % (ref 0.0–3.0)
Eosinophils Absolute: 0.1 10*3/uL (ref 0.0–0.7)
Eosinophils Relative: 4.1 % (ref 0.0–5.0)
HEMATOCRIT: 35 % — AB (ref 36.0–46.0)
HEMOGLOBIN: 12.2 g/dL (ref 12.0–15.0)
LYMPHS ABS: 1.2 10*3/uL (ref 0.7–4.0)
LYMPHS PCT: 38.1 % (ref 12.0–46.0)
MCHC: 34.8 g/dL (ref 30.0–36.0)
MCV: 96.3 fl (ref 78.0–100.0)
MONOS PCT: 10.2 % (ref 3.0–12.0)
Monocytes Absolute: 0.3 10*3/uL (ref 0.1–1.0)
NEUTROS PCT: 46.1 % (ref 43.0–77.0)
Neutro Abs: 1.4 10*3/uL (ref 1.4–7.7)
Platelets: 155 10*3/uL (ref 150.0–400.0)
RBC: 3.63 Mil/uL — AB (ref 3.87–5.11)
RDW: 15.4 % (ref 11.5–15.5)
WBC: 3.1 10*3/uL — AB (ref 4.0–10.5)

## 2018-10-04 LAB — COMPREHENSIVE METABOLIC PANEL
ALBUMIN: 3.9 g/dL (ref 3.5–5.2)
ALK PHOS: 86 U/L (ref 39–117)
ALT: 9 U/L (ref 0–35)
AST: 22 U/L (ref 0–37)
BILIRUBIN TOTAL: 0.5 mg/dL (ref 0.2–1.2)
BUN: 13 mg/dL (ref 6–23)
CO2: 30 mEq/L (ref 19–32)
Calcium: 9.5 mg/dL (ref 8.4–10.5)
Chloride: 102 mEq/L (ref 96–112)
Creatinine, Ser: 0.66 mg/dL (ref 0.40–1.20)
GFR: 85.79 mL/min (ref >60.00)
GLUCOSE: 91 mg/dL (ref 70–99)
Potassium: 4.4 mEq/L (ref 3.5–5.1)
Sodium: 137 mEq/L (ref 135–145)
TOTAL PROTEIN: 6.6 g/dL (ref 6.0–8.3)

## 2018-10-04 LAB — TSH: TSH: 0.95 u[IU]/mL (ref 0.35–4.50)

## 2018-10-04 MED ORDER — ZOSTER VAC RECOMB ADJUVANTED 50 MCG/0.5ML IM SUSR: 0.5000 mL | Freq: Once | INTRAMUSCULAR | 1 refills | Status: AC

## 2018-10-04 MED ORDER — PNEUMOCOCCAL VAC POLYVALENT 25 MCG/0.5ML IJ INJ: 0.5000 mL | INJECTION | INTRAMUSCULAR | Status: DC

## 2018-10-04 NOTE — Progress Notes (Signed)
Subjective:  Patient ID: Kim Santiago, female    DOB: Jan 23, 1935  Age: 84 y.o. MRN: [id number]  CC: The primary encounter diagnosis was Purpura ([place]). Diagnoses of Iatrogenic hypothyroidism, Need for Streptococcus pneumoniae vaccination, Discoid lupus, Recurrent UTI (urinary tract infection), Edema due to hypoalbuminemia, and History of heart attack were also pertinent to this visit.  HPI Kim Santiago presents for FOLLOW UP ON chronic issues  Recurrent UTI with history of sepsis:  Now seeing [facility] .  Last Urinalysis was jan 17 , pyuria noted  ,  cipro prescribed,   culture grew 60K serratia sensitive to cipro .  Used it for 7 days and symptoms resolved.   Stage 2B left breast cancer; invasive mammary postive LNs, ER /PR positive T2N1a   Diagnosed in 2017 ecember follow up with oncologist Katharina Caper after 4 months of Femara     Cutaneous lupus and Sjogren's.  Reaction to plaquenil, then took MTX , now on nothing managed by Baxter Kail .  Has developed a few purpura   CAD with temporary depressed EF in 2013,  Normalized by last echo  55-65% in June 2019.  Has not seen cardiology due to recurrent scheduling issues with [facility] .  She has tried 3 times.  She was encouraged to keep her appointment in February es    Moving in with children soon,  Apprehensive about her loss of independence. .   Edema:  Was prescribed furosemide and xaroxolyn in July for prn use . Not using daily   Hypothyroidism iatrogenic:  TSH has been < 1 the last two times despite reducing dose .  tkes he thyroid pill at night with letrozole and mirtazipine   Outpatient Medications Prior to Visit  Medication Sig Dispense Refill  . aspirin 81 MG tablet Take 81 mg by mouth daily.    . Calcium Carbonate-Vitamin D (CALCIUM + D) 600-200 MG-UNIT TABS Take 2 tablets by mouth 2 (two) times daily.     . cholecalciferol (VITAMIN D) 1000 UNITS tablet Take 2,000 Units by mouth daily.     . ciprofloxacin (CIPRO)  500 MG tablet Take 1 tablet (500 mg total) by mouth 2 (two) times daily. 14 tablet 0  . CRANBERRY PO Take 1 capsule by mouth 2 (two) times daily.    . furosemide (LASIX) 20 MG tablet 1 tablet every other day as needed 30 tablet 3  . letrozole (FEMARA) 2.5 MG tablet Take 1 tablet (2.5 mg total) by mouth daily. 90 tablet 3  . levothyroxine (SYNTHROID, LEVOTHROID) 75 MCG tablet Take 1 tablet by mouth (5mcg total) daily 90 tablet 0  . metolazone (ZAROXOLYN) 2.5 MG tablet TAKE 1 TABLET (2.5 MG TOTAL) BY MOUTH DAILY. 30 MINUTES BEFORE FUROSEMIDE DOSE 90 tablet 1  . metoprolol succinate (TOPROL-XL) 25 MG 24 hr tablet Take 1 tablet by mouth daily 90 tablet 0  . mirtazapine (REMERON) 7.5 MG tablet Take 1 tablet by mouth at bedtime 90 tablet 0  . nitroGLYCERIN (NITROSTAT) 0.4 MG SL tablet Place 1 tablet (0.4 mg total) under the tongue every 5 (five) minutes as needed for chest pain. Maximum 3 doses 50 tablet 3  . omeprazole (PRILOSEC) 20 MG capsule Take 1 capsule by mouth twice a day before a meal 180 capsule 0  . ondansetron (ZOFRAN ODT) 4 MG disintegrating tablet Take 1 tablet (4 mg total) by mouth every 8 (eight) hours as needed for nausea or vomiting. 20 tablet 0  . polyethylene glycol (MIRALAX /  GLYCOLAX) packet Take 17 g by mouth daily.    . Probiotic Product (PROBIOTIC PO) Take 1 tablet by mouth daily.    . sertraline (ZOLOFT) 50 MG tablet TAKE 1 TABLET BY MOUTH EVERY DAY 90 tablet 2  . tizanidine (ZANAFLEX) 2 MG capsule Take 1 capsule (2 mg total) by mouth 3 (three) times daily. 90 capsule 0  . traMADol (ULTRAM) 50 MG tablet Take 1 tablet (50 mg total) by mouth every 8 (eight) hours as needed. 60 tablet 2  . triamcinolone ointment (KENALOG) 0.1 % Apply 1 application topically daily.  0   No facility-administered medications prior to visit.     Review of Systems;  Patient denies headache, fevers, malaise, unintentional weight loss, skin rash, eye pain, sinus congestion and sinus pain, sore  throat, dysphagia,  hemoptysis , cough, dyspnea, wheezing, chest pain, palpitations, orthopnea, edema, abdominal pain, nausea, melena, diarrhea, constipation, flank pain, dysuria, hematuria, urinary  Frequency, nocturia, numbness, tingling, seizures,  Focal weakness, Loss of consciousness,  Tremor, insomnia, depression, anxiety, and suicidal ideation.      Objective:  BP (!) 142/78 (BP Location: Left Arm, Patient Position: Sitting, Cuff Size: Normal)   Pulse 78   Temp 98.1 F (36.7 C) (Oral)   Resp 16   Ht 5\' 4"  (1.626 m)   Wt 136 lb 6.4 oz (61.9 kg)   SpO2 98%   BMI 23.41 kg/m   BP Readings from Last 3 Encounters:  10/04/18 (!) 142/78  09/23/18 (!) 143/73  08/24/18 127/78    Wt Readings from Last 3 Encounters:  10/04/18 136 lb 6.4 oz (61.9 kg)  09/23/18 132 lb (59.9 kg)  08/24/18 135 lb 5.8 oz (61.4 kg)    General appearance: alert, cooperative and appears stated age Ears: normal TM's and external ear canals both ears Throat: lips, mucosa, and tongue normal; teeth and gums normal Neck: no adenopathy, no carotid bruit, supple, symmetrical, trachea midline and thyroid not enlarged, symmetric, no tenderness/mass/nodules Back: symmetric, no curvature. ROM normal. No CVA tenderness. Lungs: clear to auscultation bilaterally Heart: regular rate and rhythm, S1, S2 normal, no murmur, click, rub or gallop Abdomen: soft, non-tender; bowel sounds normal; no masses,  no organomegaly Pulses: 2+ and symmetric Skin:  Scattered purpura , few.  Legs covered in diffuse petechial rash.  Lymph nodes: Cervical, supraclavicular, and axillary nodes normal.  No results found for: HGBA1C  Lab Results  Component Value Date   CREATININE 0.66 10/04/2018   CREATININE 0.74 08/24/2018   CREATININE 0.71 04/27/2018    Lab Results  Component Value Date   WBC 3.1 (L) 10/04/2018   HGB 12.2 10/04/2018   HCT 35.0 (L) 10/04/2018   PLT 155.0 10/04/2018   GLUCOSE 91 10/04/2018   CHOL 170 06/08/2016    TRIG 86.0 06/08/2016   HDL 44.20 06/08/2016   LDLDIRECT 117.6 12/27/2012   LDLCALC 108 (H) 06/08/2016   ALT 9 10/04/2018   AST 22 10/04/2018   NA 137 10/04/2018   K 4.4 10/04/2018   CL 102 10/04/2018   CREATININE 0.66 10/04/2018   BUN 13 10/04/2018   CO2 30 10/04/2018   TSH 0.95 10/04/2018   INR 1.07 02/09/2018    Ct Chest Wo Contrast  Result Date: 09/26/2018 CLINICAL DATA:  Mediastinal lymphadenopathy. History of breast cancer. EXAM: CT CHEST WITHOUT CONTRAST TECHNIQUE: Multidetector CT imaging of the chest was performed following the standard protocol without IV contrast. COMPARISON:  09/23/2017 FINDINGS: Cardiovascular: The heart size is normal. No substantial pericardial effusion. Coronary  artery calcification is evident. Atherosclerotic calcification is noted in the wall of the thoracic aorta. Mediastinum/Nodes: 9 mm right paratracheal node measured previously is 8 mm today. The precarinal 11 mm short axis lymph node measured on the prior study is stable at 11 mm today. No new mediastinal lymphadenopathy. No evidence for gross hilar lymphadenopathy although assessment is limited by the lack of intravenous contrast on today's study. The esophagus has normal imaging features. There is no axillary lymphadenopathy. Lungs/Pleura: The central tracheobronchial airways are patent. 4 mm perifissural nodule anterior right lung (65/3) is stable as is the adjacent 3 mm anterior right lung nodule (70/3). No suspicious nodule or mass. No pleural effusion. Upper Abdomen: Unremarkable. Musculoskeletal: No worrisome lytic or sclerotic osseous abnormality. Vertebral augmentation at L1 again noted. IMPRESSION: 1. Stable exam. No change in the previous described mediastinal lymph nodes. 2. Stable tiny right lung nodules consistent with benign etiology. 3.  Aortic Atherosclerois (ICD10-170.0) Electronically Signed   By: Misty Stanley M.D.   On: 09/26/2018 11:51    Assessment & Plan:   Problem List Items  Addressed This Visit    Discoid lupus    Currently untreated due to multiple drug intolerances      Edema due to hypoalbuminemia    imporved with weight gain.  No longer using furosemide daily  Lab Results  Component Value Date   CREATININE 0.66 10/04/2018   Lab Results  Component Value Date   NA 137 10/04/2018   K 4.4 10/04/2018   CL 102 10/04/2018   CO2 30 10/04/2018         History of heart attack    With recent episode of chest pain.  She had a  normal cardiac cath in 2013  But had XRT ochst wall since then for treatment of breast CA .Marland Kitchen  She has follow up with Fletcher Anon in February. She declines statin therapy due to age,  Continue metoprolol, prn ntg given at last visit       Iatrogenic hypothyroidism   Relevant Orders   TSH (Completed)   Purpura ([place]) - Primary    New finding,  In the setting of drop in platelets..repeat level today is stable  Lab Results  Component Value Date   WBC 3.1 (L) 10/04/2018   HGB 12.2 10/04/2018   HCT 35.0 (L) 10/04/2018   MCV 96.3 10/04/2018   PLT 155.0 10/04/2018         Relevant Orders   CBC with Differential/Platelet (Completed)   Comprehensive metabolic panel (Completed)   Recurrent UTI (urinary tract infection)    Treated recently for Serratia UTI  On Jan 17 with cipro.  Symptoms have resolved.        Other Visit Diagnoses    Need for Streptococcus pneumoniae vaccination       Relevant Orders   Pneumococcal polysaccharide vaccine 23-valent greater than or equal to 2yo subcutaneous/IM (Completed)     A total of 25 minutes of face to face time was spent with patient more than half of which was spent in counselling about the above mentioned conditions  and coordination of care  I am having Kim Santiago start on Zoster Vaccine Adjuvanted. I am also having her maintain her cholecalciferol, Calcium Carbonate-Vitamin D, aspirin, polyethylene glycol, tizanidine, traMADol, furosemide, ondansetron, metolazone, triamcinolone  ointment, Probiotic Product (PROBIOTIC PO), nitroGLYCERIN, sertraline, mirtazapine, metoprolol succinate, omeprazole, letrozole, levothyroxine, CRANBERRY PO, and ciprofloxacin.  Meds ordered this encounter  Medications  . Zoster Vaccine Adjuvanted [facility]) injection  Sig: Inject 0.5 mLs into the muscle once for 1 dose.    Dispense:  1 each    Refill:  1  . DISCONTD: pneumococcal 23 valent vaccine (PNU-IMMUNE) injection 0.5 mL    Medications Discontinued During This Encounter  Medication Reason  . pneumococcal 23 valent vaccine (PNU-IMMUNE) injection 0.5 mL     Follow-up: No follow-ups on file.   [name], MD

## 2018-10-04 NOTE — Patient Instructions (Addendum)
   The ShingRx vaccine is now available in local pharmacies and is much more protective than the old one  Zostavax  (it is about 97%  Effective in preventing shingles). .   It is therefore ADVISED for all interested adults over 50 to prevent shingles so I have printed you a prescription for it.  (it requires a 2nd dose 2 to 6 months after the first one) .  It will cause you to have flu  like symptoms for 2 days   Your purple spots are called purpura.  Think of them as blood blisters, and they can be caused by fragile skin,  Low platelets  And other causes.

## 2018-10-06 NOTE — Assessment & Plan Note (Signed)
New finding,  In the setting of drop in platelets..repeat level today is stable  Lab Results  Component Value Date   WBC 3.1 (L) 10/04/2018   HGB 12.2 10/04/2018   HCT 35.0 (L) 10/04/2018   MCV 96.3 10/04/2018   PLT 155.0 10/04/2018

## 2018-10-06 NOTE — Assessment & Plan Note (Signed)
Currently untreated due to multiple drug intolerances

## 2018-10-06 NOTE — Assessment & Plan Note (Signed)
Treated recently for Serratia UTI  On Jan 17 with cipro.  Symptoms have resolved.

## 2018-10-06 NOTE — Assessment & Plan Note (Addendum)
With recent episode of chest pain.  She had a  normal cardiac cath in 2013  But had XRT ochst wall since then for treatment of breast CA .Kim Santiago  She has follow up with Fletcher Anon in February. She declines statin therapy due to age,  Continue metoprolol, prn ntg given at last visit

## 2018-10-06 NOTE — Assessment & Plan Note (Signed)
imporved with weight gain.  No longer using furosemide daily  Lab Results  Component Value Date   CREATININE 0.66 10/04/2018   Lab Results  Component Value Date   NA 137 10/04/2018   K 4.4 10/04/2018   CL 102 10/04/2018   CO2 30 10/04/2018

## 2018-10-07 DIAGNOSIS — L931 Subacute cutaneous lupus erythematosus: Secondary | ICD-10-CM | POA: Diagnosis not present

## 2018-10-07 DIAGNOSIS — T691XXA Chilblains, initial encounter: Secondary | ICD-10-CM | POA: Diagnosis not present

## 2018-10-07 DIAGNOSIS — D692 Other nonthrombocytopenic purpura: Secondary | ICD-10-CM | POA: Diagnosis not present

## 2018-10-08 DIAGNOSIS — I639 Cerebral infarction, unspecified: Secondary | ICD-10-CM

## 2018-10-08 HISTORY — DX: Cerebral infarction, unspecified: I63.9

## 2018-10-10 ENCOUNTER — Other Ambulatory Visit: Payer: Self-pay | Admitting: General Surgery

## 2018-10-10 ENCOUNTER — Other Ambulatory Visit: Payer: Self-pay | Admitting: Internal Medicine

## 2018-10-10 DIAGNOSIS — Z1231 Encounter for screening mammogram for malignant neoplasm of breast: Secondary | ICD-10-CM

## 2018-10-12 ENCOUNTER — Other Ambulatory Visit: Payer: Self-pay | Admitting: Internal Medicine

## 2018-10-12 NOTE — Telephone Encounter (Signed)
Requested medication (s) are due for refill today -yes  Requested medication (s) are on the active medication list -yes  Future visit scheduled -no  Last refill: 2 months ago  Notes to clinic: Patient is requesting RF of medication which should pass protocol- patient has some labs that are out dated- sent for review and refill.  Requested Prescriptions  Pending Prescriptions Disp Refills   mirtazapine (REMERON) 7.5 MG tablet 90 tablet 0    Sig: Take 1 tablet (7.5 mg total) by mouth at bedtime.     Psychiatry: Antidepressants - mirtazapine Failed - 10/12/2018  1:29 PM      Failed - Triglycerides in normal range and within 360 days    Triglycerides  Date Value Ref Range Status  06/08/2016 86.0 0.0 - 149.0 mg/dL Final    Comment:    Normal:  <150 mg/dLBorderline High:  150 - 199 mg/dL  05/31/2012 57 0 - 200 mg/dL Final         Failed - Total Cholesterol in normal range and within 360 days    Cholesterol  Date Value Ref Range Status  06/08/2016 170 0 - 200 mg/dL Final    Comment:    ATP III Classification       Desirable:  < 200 mg/dL               Borderline High:  200 - 239 mg/dL          High:  > = 240 mg/dL  05/31/2012 157 0 - 200 mg/dL Final         Failed - WBC in normal range and within 360 days    WBC  Date Value Ref Range Status  10/04/2018 3.1 (L) 4.0 - 10.5 K/uL Final         Passed - AST in normal range and within 360 days    AST  Date Value Ref Range Status  10/04/2018 22 0 - 37 U/L Final         Passed - ALT in normal range and within 360 days    ALT  Date Value Ref Range Status  10/04/2018 9 0 - 35 U/L Final         Passed - Completed PHQ-2 or PHQ-9 in the last 360 days.      Passed - Valid encounter within last 6 months    Recent Outpatient Visits          1 week ago [facility])   [place] [name], MD   3 months ago [place] [name], MD   4 months ago Do not resuscitate  status   [facility] [place] [name], MD   5 months ago Encounter for preventive health examination   [place] [name], MD   7 months ago Dysuria   [facility] [place] [name], MD      Future Appointments            In 5 months O'Brien-Blaney, Denisa L, LPN [facility] [place], [place]   In 5 months [name], MD [place], [facility]            Requested Prescriptions  Pending Prescriptions Disp Refills   mirtazapine (REMERON) 7.5 MG tablet 90 tablet 0    Sig: Take 1 tablet (7.5 mg total) by mouth at bedtime.     Psychiatry: Antidepressants -  mirtazapine Failed - 10/12/2018  1:29 PM      Failed - Triglycerides in normal range and within 360 days    Triglycerides  Date Value Ref Range Status  06/08/2016 86.0 0.0 - 149.0 mg/dL Final    Comment:    Normal:  <150 mg/dLBorderline High:  150 - 199 mg/dL  05/31/2012 57 0 - 200 mg/dL Final         Failed - Total Cholesterol in normal range and within 360 days    Cholesterol  Date Value Ref Range Status  06/08/2016 170 0 - 200 mg/dL Final    Comment:    ATP III Classification       Desirable:  < 200 mg/dL               Borderline High:  200 - 239 mg/dL          High:  > = 240 mg/dL  05/31/2012 157 0 - 200 mg/dL Final         Failed - WBC in normal range and within 360 days    WBC  Date Value Ref Range Status  10/04/2018 3.1 (L) 4.0 - 10.5 K/uL Final         Passed - AST in normal range and within 360 days    AST  Date Value Ref Range Status  10/04/2018 22 0 - 37 U/L Final         Passed - ALT in normal range and within 360 days    ALT  Date Value Ref Range Status  10/04/2018 9 0 - 35 U/L Final         Passed - Completed PHQ-2 or PHQ-9 in the last 360 days.      Passed - Valid encounter within last 6 months    Recent Outpatient Visits          1 week ago [facility])   [place] [name], MD   3 months ago [place] [name], MD   4 months ago Do not resuscitate status   [facility] [place] [name], MD   5 months ago Encounter for preventive health examination   [place] [name], MD   7 months ago [place] [name], MD      Future Appointments            In 5 months O'Brien-Blaney, Bryson Corona, LPN [place], [place]   In 5 months Derrel Nip, Aris Everts, MD [facility], [place]

## 2018-10-12 NOTE — Telephone Encounter (Signed)
Copied from [place] [phone]. Topic: Quick Communication - Rx Refill/Question >> Oct 12, 2018  1:25 PM [place], [place] D wrote: Medication: mirtazapine (REMERON) 7.5 MG tablet / Pt stated that pharmacy has been trying to reach out to request a refill with no success.  Has the patient contacted their pharmacy? Yes.   (Agent: If no, request that the patient contact the pharmacy for the refill.) (Agent: If yes, when and what did the pharmacy advise?)  Preferred Pharmacy (with phone number or street name): [place] [facility] - [place], [place] [phone] (Phone) [phone] (Fax)  Agent: Please be advised that RX refills may take up to 3 business days. We ask that you follow-up with your pharmacy.

## 2018-10-13 MED ORDER — MIRTAZAPINE 7.5 MG PO TABS: 7.5000 mg | ORAL_TABLET | Freq: Every day | ORAL | 0 refills | Status: DC

## 2018-10-14 ENCOUNTER — Encounter: Payer: Self-pay | Admitting: Emergency Medicine

## 2018-10-14 ENCOUNTER — Other Ambulatory Visit: Payer: Self-pay

## 2018-10-14 ENCOUNTER — Ambulatory Visit
Admission: EM | Admit: 2018-10-14 | Discharge: 2018-10-14 | Disposition: A | Discharge status: Home / Self Care | Payer: PPO | Attending: Family Medicine | Admitting: Family Medicine

## 2018-10-14 DIAGNOSIS — I10 Essential (primary) hypertension: Secondary | ICD-10-CM | POA: Diagnosis not present

## 2018-10-14 MED ORDER — LOSARTAN POTASSIUM 25 MG PO TABS: 25.0000 mg | ORAL_TABLET | Freq: Every day | ORAL | 0 refills | Status: DC

## 2018-10-14 MED ORDER — LOSARTAN POTASSIUM 50 MG PO TABS: 50.0000 mg | ORAL_TABLET | Freq: Every day | ORAL | 0 refills | Status: DC

## 2018-10-14 NOTE — ED Provider Notes (Signed)
[facility]    CSN: [id number] Arrival date & time: 10/14/18  1350     History   Chief Complaint Chief Complaint  Patient presents with  . Hypertension  . Headache    HPI Kim Santiago is a 84 y.o. female.   84 yo female with a h/o hypertension in the past presents with c/o high blood pressure readings. States she was taken off losartan after starting chemo due to low blood pressures but now blood pressure has consistently been high. Denies any chest pains or shortness of breath.   The history is provided by the patient.    Past Medical History:  Diagnosis Date  . Arthritis   . Breast cancer ([place]) 2017   left mastectomy done 11/2015  . Breast cancer in female [facility]) 11/18/2015   Left: 3.9 cm tumor, T2, 1/2 sentinel nodes positive for macro metastatic disease, N1, 3 negative nodes in the axillary tail, ER+,PR+, Her 2 neu, low Mammoprint score  . Cancer [facility])    thyroid takes levothyroxine  . Chronic kidney disease    UTI  . Genetic screening March 2017.   Mammoprint of left breast cancer: Low risk for recurrence.   . Hypertension   . hypothyroidism    secondary to thyroidectomy for thyroid ca  . Hypothyroidism   . Lupus ([facility])    subcutaneous  . Menopause 40s   natural, hot flashes and mood lability now gone, off prempro 7 months  . Myocardial infarction ([place]) 2013  . Osteoporosis    Osteopenia  . Rosacea   . Sjoegren syndrome   . Stress-induced cardiomyopathy September of 2013   EF 35%. Peak troponin was 1.8.    Patient Active Problem List   Diagnosis Date Noted  . Anxiety about health 07/05/2018  . Mediastinal adenopathy 04/27/2018  . Edema due to hypoalbuminemia 02/20/2018  . Hospital discharge follow-up 02/20/2018  . History of vertebral fracture 02/18/2018  . GERD (gastroesophageal reflux disease) 10/31/2017  . Hand joint pain 06/13/2017  . Vaginal atrophy 05/11/2017  . Venous insufficiency of both lower extremities 01/10/2017  . Chest  pain of uncertain etiology 40/98/1191  . Discoid lupus 06/26/2016  . Recurrent UTI (urinary tract infection) 04/29/2016  . TMJ syndrome 03/31/2016  . Hearing loss 03/12/2016  . Osteoporosis of lumbar spine 12/05/2015  . Malignant neoplasm of left female breast ([place]) 11/26/2015  . Breast cancer, female, left 11/05/2015  . Do not resuscitate discussion 09/18/2015  . History of heart attack 06/12/2014  . Hx of thyroid cancer 06/12/2014  . Adenomatous polyp 06/12/2014  . Major depressive disorder, recurrent episode, moderate ([place]) 05/29/2014  . Grief 04/08/2014  . Sjogren's syndrome ([place]) 03/09/2014  . Insomnia due to stress 03/06/2014  . Cervicalgia of occipito-atlanto-axial region 09/22/2013  . Hyperlipidemia 12/28/2012  . Medicare annual wellness visit, subsequent 12/28/2012  . Iatrogenic hypothyroidism 10/02/2011  . [place] ([place]) 05/25/2011    Past Surgical History:  Procedure Laterality Date  . BACK SURGERY    . BREAST BIOPSY Left 10/30/15   positive, done in Dr. Dwyane Luo office  . CARDIAC CATHETERIZATION  05/2012   [facility]. No significant CAD. Ejection fraction of 35% due to stress-induced cardiomyopathy.  . CHOLECYSTECTOMY    . COLONOSCOPY    . DILATION AND CURETTAGE OF UTERUS    . KYPHOSIS SURGERY  Feb 2008   L1, Dr. Mauri Pole  . LUMBAR DISC SURGERY     L4-L5  . MASTECTOMY Left 11/18/2015   positive  . SENTINEL NODE  BIOPSY Left 11/18/2015   Procedure: SENTINEL NODE BIOPSY;  Surgeon: Robert Bellow, MD;  Location: [facility] ORS;  Service: General;  Laterality: Left;  . SHOULDER ARTHROSCOPY  2004   Left, Dr. Jefm Bryant  . SIMPLE MASTECTOMY WITH AXILLARY SENTINEL NODE BIOPSY Left 11/18/2015   Procedure: SIMPLE MASTECTOMY;  Surgeon: Robert Bellow, MD;  Location: [facility] ORS;  Service: General;  Laterality: Left;  . SPINE SURGERY     L4-5 diskectomy  . THYROIDECTOMY     Thyroid Cancer  . TONSILLECTOMY    . TUBAL LIGATION      OB History    Gravida  0   Para  0   Term  0     Preterm  0   AB  0   Living  0     SAB  0   TAB  0   Ectopic  0   Multiple  0   Live Births           Obstetric Comments  1st Menstrual Cycle:  16 1 adopted child           Home Medications    Prior to Admission medications   Medication Sig Start Date End Date Taking? Authorizing Provider  aspirin 81 MG tablet Take 81 mg by mouth daily.   Yes [provider]  Calcium Carbonate-Vitamin D (CALCIUM + D) 600-200 MG-UNIT TABS Take 2 tablets by mouth 2 (two) times daily.    Yes [provider]  cholecalciferol (VITAMIN D) 1000 UNITS tablet Take 2,000 Units by mouth daily.    Yes [provider]  CRANBERRY PO Take 1 capsule by mouth 2 (two) times daily.   Yes [provider]  letrozole (FEMARA) 2.5 MG tablet Take 1 tablet (2.5 mg total) by mouth daily. 08/03/18  Yes Karen Kitchens, NP  levothyroxine (SYNTHROID, LEVOTHROID) 75 MCG tablet Take 1 tablet by mouth (59mcg total) daily 08/15/18  Yes [name], MD  metolazone (ZAROXOLYN) 2.5 MG tablet TAKE 1 TABLET (2.5 MG TOTAL) BY MOUTH DAILY. [place] DOSE 03/31/18  Yes [name], MD  metoprolol succinate (TOPROL-XL) 25 MG 24 hr tablet Take 1 tablet by mouth daily 08/02/18  Yes [name], MD  mirtazapine (REMERON) 7.5 MG tablet Take 1 tablet (7.5 mg total) by mouth at bedtime. 10/13/18  Yes [name], MD  Probiotic Product (PROBIOTIC PO) Take 1 tablet by mouth daily.   Yes [provider]  ciprofloxacin (CIPRO) 500 MG tablet Take 1 tablet (500 mg total) by mouth 2 (two) times daily. 09/23/18   Hollice Espy, MD  furosemide (LASIX) 20 MG tablet 1 tablet every other day as needed 02/16/18   [name], MD  losartan (COZAAR) 25 MG tablet Take 1 tablet (25 mg total) by mouth daily. 10/14/18   Norval Gable, MD  nitroGLYCERIN (NITROSTAT) 0.4 MG SL tablet Place 1 tablet (0.4 mg total) under the tongue every 5 (five) minutes as needed for chest pain.  Maximum 3 doses 07/04/18   [name], MD  omeprazole (PRILOSEC) 20 MG capsule Take 1 capsule by mouth twice a day before a meal 08/02/18   [name], MD  ondansetron (ZOFRAN ODT) 4 MG disintegrating tablet Take 1 tablet (4 mg total) by mouth every 8 (eight) hours as needed for nausea or vomiting. 02/28/18   Eula Listen, MD  polyethylene glycol (MIRALAX / GLYCOLAX) packet Take 17 g by mouth daily.  [provider]  sertraline (ZOLOFT) 50 MG tablet TAKE 1 TABLET BY MOUTH EVERY DAY 07/26/18   [name], MD  tizanidine (ZANAFLEX) 2 MG capsule Take 1 capsule (2 mg total) by mouth 3 (three) times daily. 02/16/18   [name], MD  traMADol (ULTRAM) 50 MG tablet Take 1 tablet (50 mg total) by mouth every 8 (eight) hours as needed. 02/16/18   [name], MD  triamcinolone ointment (KENALOG) 0.1 % Apply 1 application topically daily. 03/31/18   [provider]    Family History Family History  Problem Relation Age of Onset  . COPD Father   . Cancer Father        esophageal  . Kidney disease Mother   . Heart disease Mother   . Kidney disease Sister   . Cancer Brother 90       colon cancer (both brothers)  . Heart attack Brother 52  . Heart disease Brother   . Breast cancer Neg Hx     Social History Social History   Tobacco Use  . Smoking status: Never Smoker  . Smokeless tobacco: Never Used  Substance Use Topics  . Alcohol use: No  . Drug use: No     Allergies   Azathioprine; Mycophenolate mofetil; Amoxicillin; Codeine; Naprosyn [naproxen]; Orudis [ketoprofen]; Plaquenil [hydroxychloroquine]; and Sulfathiazole   Review of Systems Review of Systems   Physical Exam Triage Vital Signs ED Triage Vitals  Enc Vitals Group     BP 10/14/18 1454 (!) 169/73     Pulse Rate 10/14/18 1454 85     Resp 10/14/18 1454 14     Temp 10/14/18 1454 98.4 F (36.9 C)     Temp Source 10/14/18 1454 Oral     SpO2 10/14/18 1454 100 %     Weight  10/14/18 1448 133 lb (60.3 kg)     Height 10/14/18 1448 5\' 4"  (1.626 m)     Head Circumference --      Peak Flow --      Pain Score 10/14/18 1448 3     Pain Loc --      Pain Edu? --      Excl. in [place]? --    No data found.  Updated Vital Signs BP (!) 169/73 (BP Location: Right Arm)   Pulse 85   Temp 98.4 F (36.9 C) (Oral)   Resp 14   Ht 5\' 4"  (1.626 m)   Wt 60.3 kg   SpO2 100%   BMI 22.83 kg/m   Visual Acuity Right Eye Distance:   Left Eye Distance:   Bilateral Distance:    Right Eye Near:   Left Eye Near:    Bilateral Near:     Physical Exam Vitals signs and nursing note reviewed.  Constitutional:      General: She is not in acute distress.    Appearance: She is not toxic-appearing or diaphoretic.  Cardiovascular:     Rate and Rhythm: Normal rate and regular rhythm.     Heart sounds: Normal heart sounds.  Pulmonary:     Effort: Pulmonary effort is normal. No respiratory distress.     Breath sounds: Normal breath sounds. No stridor. No wheezing, rhonchi or rales.  Neurological:     Mental Status: She is alert.      UC Treatments / Results  Labs (all labs ordered are listed, but only abnormal results are displayed) Labs Reviewed - No data to display  EKG None  Radiology  No results found.  Procedures Procedures (including critical care time)  Medications Ordered in UC Medications - No data to display  Initial Impression / Assessment and Plan / UC Course  I have reviewed the triage vital signs and the nursing notes.  Pertinent labs & imaging results that were available during my care of the patient were reviewed by me and considered in my medical decision making (see chart for details).      Final Clinical Impressions(s) / UC Diagnoses   Final diagnoses:  Essential hypertension    ED Prescriptions    Medication Sig Dispense Auth. Provider   losartan (COZAAR) 50 MG tablet  (Status: Discontinued) Take 1 tablet (50 mg total) by mouth daily.  30 tablet Norval Gable, MD   losartan (COZAAR) 25 MG tablet Take 1 tablet (25 mg total) by mouth daily. 30 tablet Norval Gable, MD      1. diagnosis reviewed with patient 2. rx as per orders above; reviewed possible side effects, interactions, risks and benefits  3. Recommend follow up with PCP  Controlled Substance Prescriptions [place] Controlled Substance Registry consulted? Not Applicable   Norval Gable, MD 10/14/18 [phone]

## 2018-10-14 NOTE — ED Triage Notes (Signed)
Patient c/o HA and elevated that started today.  Patient states that she is no longer on BP medicine but has been on Losartan in the past.

## 2018-10-17 ENCOUNTER — Other Ambulatory Visit: Payer: Self-pay | Admitting: Internal Medicine

## 2018-10-17 DIAGNOSIS — I10 Essential (primary) hypertension: Secondary | ICD-10-CM

## 2018-10-17 NOTE — Assessment & Plan Note (Signed)
Losartan started Oct 14 2018 by urgent care for readings of 170/70.  bmet ordered

## 2018-10-18 ENCOUNTER — Ambulatory Visit: Payer: PPO | Admitting: Family Medicine

## 2018-10-21 ENCOUNTER — Other Ambulatory Visit (INDEPENDENT_AMBULATORY_CARE_PROVIDER_SITE_OTHER): Payer: PPO

## 2018-10-21 DIAGNOSIS — I10 Essential (primary) hypertension: Secondary | ICD-10-CM

## 2018-10-21 LAB — BASIC METABOLIC PANEL
BUN: 16 mg/dL (ref 6–23)
CO2: 31 mEq/L (ref 19–32)
Calcium: 8.9 mg/dL (ref 8.4–10.5)
Chloride: 101 mEq/L (ref 96–112)
Creatinine, Ser: 0.68 mg/dL (ref 0.40–1.20)
GFR: 82.88 mL/min (ref >60.00)
GLUCOSE: 83 mg/dL (ref 70–99)
POTASSIUM: 4.8 meq/L (ref 3.5–5.1)
SODIUM: 137 meq/L (ref 135–145)

## 2018-10-26 ENCOUNTER — Other Ambulatory Visit: Payer: Self-pay | Admitting: Family Medicine

## 2018-10-27 ENCOUNTER — Other Ambulatory Visit: Payer: Self-pay | Admitting: Internal Medicine

## 2018-11-04 DIAGNOSIS — L931 Subacute cutaneous lupus erythematosus: Secondary | ICD-10-CM | POA: Diagnosis not present

## 2018-11-04 DIAGNOSIS — T691XXD Chilblains, subsequent encounter: Secondary | ICD-10-CM | POA: Diagnosis not present

## 2018-11-04 DIAGNOSIS — D692 Other nonthrombocytopenic purpura: Secondary | ICD-10-CM | POA: Diagnosis not present

## 2018-11-05 ENCOUNTER — Ambulatory Visit
Admission: EM | Admit: 2018-11-05 | Discharge: 2018-11-05 | Disposition: A | Discharge status: Home / Self Care | Payer: PPO | Source: Home / Self Care | Attending: Family Medicine | Admitting: Family Medicine

## 2018-11-05 ENCOUNTER — Encounter: Payer: Self-pay | Admitting: Emergency Medicine

## 2018-11-05 ENCOUNTER — Other Ambulatory Visit: Payer: Self-pay

## 2018-11-05 DIAGNOSIS — I10 Essential (primary) hypertension: Secondary | ICD-10-CM

## 2018-11-05 MED ORDER — LOSARTAN POTASSIUM 25 MG PO TABS: 50.0000 mg | ORAL_TABLET | Freq: Every day | ORAL | 0 refills | Status: DC

## 2018-11-05 NOTE — ED Triage Notes (Signed)
Patient reports history of HTN.  Patient is currently on BP medicine.  Patient reports elevated BP for the past 2-3 days.

## 2018-11-05 NOTE — ED Provider Notes (Signed)
[facility]    CSN: [id number] Arrival date & time: 11/05/18  0945     History   Chief Complaint Chief Complaint  Patient presents with  . Hypertension    HPI Kim Santiago is a 84 y.o. female.   84 yo female with a c/o "high blood pressure readings" at home. Patient has hypertension and takes losartan 25mg  daily. Brings in bp readings from home and ranging in the systolic 194-174Y. Denies chest pains or shortness of breath.   The history is provided by the patient.  Hypertension     Past Medical History:  Diagnosis Date  . Arthritis   . Breast cancer ([place]) 2017   left mastectomy done 11/2015  . Breast cancer in female [facility]) 11/18/2015   Left: 3.9 cm tumor, T2, 1/2 sentinel nodes positive for macro metastatic disease, N1, 3 negative nodes in the axillary tail, ER+,PR+, Her 2 neu, low Mammoprint score  . Cancer [facility])    thyroid takes levothyroxine  . Chronic kidney disease    UTI  . Genetic screening March 2017.   Mammoprint of left breast cancer: Low risk for recurrence.   . Hypertension   . hypothyroidism    secondary to thyroidectomy for thyroid ca  . Hypothyroidism   . Lupus (HCC)    subcutaneous  . Menopause 40s   natural, hot flashes and mood lability now gone, off prempro 7 months  . Myocardial infarction ([place]) 2013  . Osteoporosis    Osteopenia  . Rosacea   . Sjoegren syndrome   . Stress-induced cardiomyopathy September of 2013   EF 35%. Peak troponin was 1.8.    Patient Active Problem List   Diagnosis Date Noted  . Anxiety about health 07/05/2018  . Mediastinal adenopathy 04/27/2018  . Edema due to hypoalbuminemia 02/20/2018  . Hospital discharge follow-up 02/20/2018  . History of vertebral fracture 02/18/2018  . GERD (gastroesophageal reflux disease) 10/31/2017  . Hand joint pain 06/13/2017  . Vaginal atrophy 05/11/2017  . Venous insufficiency of both lower extremities 01/10/2017  . Chest pain of uncertain etiology 81/44/8185    . Discoid lupus 06/26/2016  . Recurrent UTI (urinary tract infection) 04/29/2016  . TMJ syndrome 03/31/2016  . Hearing loss 03/12/2016  . Osteoporosis of lumbar spine 12/05/2015  . Malignant neoplasm of left female breast ([place]) 11/26/2015  . Breast cancer, female, left 11/05/2015  . Do not resuscitate discussion 09/18/2015  . History of heart attack 06/12/2014  . Hx of thyroid cancer 06/12/2014  . Adenomatous polyp 06/12/2014  . Major depressive disorder, recurrent episode, moderate ([place]) 05/29/2014  . Grief 04/08/2014  . Sjogren's syndrome ([place]) 03/09/2014  . Insomnia due to stress 03/06/2014  . Cervicalgia of occipito-atlanto-axial region 09/22/2013  . Hyperlipidemia 12/28/2012  . Medicare annual wellness visit, subsequent 12/28/2012  . Essential hypertension 10/02/2011  . Iatrogenic hypothyroidism 10/02/2011  . Purpura ([place]) 05/25/2011    Past Surgical History:  Procedure Laterality Date  . BACK SURGERY    . BREAST BIOPSY Left 10/30/15   positive, done in Dr. Dwyane Luo office  . CARDIAC CATHETERIZATION  05/2012   [facility]. No significant CAD. Ejection fraction of 35% due to stress-induced cardiomyopathy.  . CHOLECYSTECTOMY    . COLONOSCOPY    . DILATION AND CURETTAGE OF UTERUS    . KYPHOSIS SURGERY  Feb 2008   L1, Dr. Mauri Pole  . LUMBAR DISC SURGERY     L4-L5  . MASTECTOMY Left 11/18/2015   positive  . SENTINEL NODE BIOPSY Left  11/18/2015   Procedure: SENTINEL NODE BIOPSY;  Surgeon: Robert Bellow, MD;  Location: [facility] ORS;  Service: General;  Laterality: Left;  . SHOULDER ARTHROSCOPY  2004   Left, Dr. Jefm Bryant  . SIMPLE MASTECTOMY WITH AXILLARY SENTINEL NODE BIOPSY Left 11/18/2015   Procedure: SIMPLE MASTECTOMY;  Surgeon: Robert Bellow, MD;  Location: [facility] ORS;  Service: General;  Laterality: Left;  . SPINE SURGERY     L4-5 diskectomy  . THYROIDECTOMY     Thyroid Cancer  . TONSILLECTOMY    . TUBAL LIGATION      OB History    Gravida  0   Para  0   Term  0    Preterm  0   AB  0   Living  0     SAB  0   TAB  0   Ectopic  0   Multiple  0   Live Births           Obstetric Comments  1st Menstrual Cycle:  14 1 adopted child           Home Medications    Prior to Admission medications   Medication Sig Start Date End Date Taking? Authorizing Provider  aspirin 81 MG tablet Take 81 mg by mouth daily.   Yes [provider]  Calcium Carbonate-Vitamin D (CALCIUM + D) 600-200 MG-UNIT TABS Take 2 tablets by mouth 2 (two) times daily.    Yes [provider]  cholecalciferol (VITAMIN D) 1000 UNITS tablet Take 2,000 Units by mouth daily.    Yes [provider]  CRANBERRY PO Take 1 capsule by mouth 2 (two) times daily.   Yes [provider]  letrozole (FEMARA) 2.5 MG tablet Take 1 tablet (2.5 mg total) by mouth daily. 08/03/18  Yes Karen Kitchens, NP  levothyroxine (SYNTHROID, LEVOTHROID) 75 MCG tablet Take 1 tablet by mouth (10mcg total) daily 08/15/18  Yes [name], MD  metolazone (ZAROXOLYN) 2.5 MG tablet TAKE 1 TABLET (2.5 MG TOTAL) BY MOUTH DAILY. [place] DOSE 03/31/18  Yes [name], MD  metoprolol succinate (TOPROL-XL) 25 MG 24 hr tablet Take 1 tablet by mouth daily 08/02/18  Yes [name], MD  mirtazapine (REMERON) 7.5 MG tablet Take 1 tablet (7.5 mg total) by mouth at bedtime. 10/13/18  Yes [name], MD  Probiotic Product (PROBIOTIC PO) Take 1 tablet by mouth daily.   Yes [provider]  sertraline (ZOLOFT) 50 MG tablet TAKE 1 TABLET BY MOUTH EVERY DAY 07/26/18  Yes [name], MD  ciprofloxacin (CIPRO) 500 MG tablet Take 1 tablet (500 mg total) by mouth 2 (two) times daily. 09/23/18   Hollice Espy, MD  furosemide (LASIX) 20 MG tablet 1 tablet every other day as needed 02/16/18   [name], MD  losartan (COZAAR) 25 MG tablet Take 2 tablets (50 mg total) by mouth daily. 11/05/18   Norval Gable, MD  nitroGLYCERIN (NITROSTAT) 0.4 MG  SL tablet Place 1 tablet (0.4 mg total) under the tongue every 5 (five) minutes as needed for chest pain. Maximum 3 doses 07/04/18   [name], MD  omeprazole (PRILOSEC) 20 MG capsule Take 1 capsule by mouth twice a day before a meal 08/02/18   [name], MD  ondansetron (ZOFRAN ODT) 4 MG disintegrating tablet Take 1 tablet (4 mg total) by mouth every 8 (eight) hours as needed for nausea or vomiting. 02/28/18   Eula Listen,  MD  polyethylene glycol (MIRALAX / GLYCOLAX) packet Take 17 g by mouth daily.    [provider]  tizanidine (ZANAFLEX) 2 MG capsule Take 1 capsule (2 mg total) by mouth 3 (three) times daily. 02/16/18   [name], MD  traMADol (ULTRAM) 50 MG tablet Take 1 tablet (50 mg total) by mouth every 8 (eight) hours as needed. 02/16/18   [name], MD  triamcinolone ointment (KENALOG) 0.1 % Apply 1 application topically daily. 03/31/18   [provider]    Family History Family History  Problem Relation Age of Onset  . COPD Father   . Cancer Father        esophageal  . Kidney disease Mother   . Heart disease Mother   . Kidney disease Sister   . Cancer Brother 25       colon cancer (both brothers)  . Heart attack Brother 30  . Heart disease Brother   . Breast cancer Neg Hx     Social History Social History   Tobacco Use  . Smoking status: Never Smoker  . Smokeless tobacco: Never Used  Substance Use Topics  . Alcohol use: No  . Drug use: No     Allergies   Azathioprine; Mycophenolate mofetil; Amoxicillin; Codeine; Naprosyn [naproxen]; Orudis [ketoprofen]; Plaquenil [hydroxychloroquine]; and Sulfathiazole   Review of Systems Review of Systems   Physical Exam Triage Vital Signs ED Triage Vitals  Enc Vitals Group     BP 11/05/18 0959 132/65     Pulse Rate 11/05/18 0959 87     Resp 11/05/18 0959 14     Temp 11/05/18 0959 98.4 F (36.9 C)     Temp Source 11/05/18 0959 Oral     SpO2 11/05/18 0959 100 %      Weight 11/05/18 0953 132 lb (59.9 kg)     Height 11/05/18 0953 5\' 4"  (1.626 m)     Head Circumference --      Peak Flow --      Pain Score 11/05/18 0952 0     Pain Loc --      Pain Edu? --      Excl. in [place]? --    No data found.  Updated Vital Signs BP 132/65 (BP Location: Right Arm)   Pulse 87   Temp 98.4 F (36.9 C) (Oral)   Resp 14   Ht 5\' 4"  (1.626 m)   Wt 59.9 kg   SpO2 100%   BMI 22.66 kg/m   Visual Acuity Right Eye Distance:   Left Eye Distance:   Bilateral Distance:    Right Eye Near:   Left Eye Near:    Bilateral Near:     Physical Exam Vitals signs and nursing note reviewed.  Constitutional:      General: She is not in acute distress.    Appearance: She is not toxic-appearing or diaphoretic.  Cardiovascular:     Rate and Rhythm: Normal rate.  Pulmonary:     Effort: Pulmonary effort is normal. No respiratory distress.  Neurological:     Mental Status: She is alert.      UC Treatments / Results  Labs (all labs ordered are listed, but only abnormal results are displayed) Labs Reviewed - No data to display  EKG None  Radiology No results found.  Procedures Procedures (including critical care time)  Medications Ordered in UC Medications - No data to display  Initial Impression / Assessment and Plan / UC Course  I  have reviewed the triage vital signs and the nursing notes.  Pertinent labs & imaging results that were available during my care of the patient were reviewed by me and considered in my medical decision making (see chart for details).      Final Clinical Impressions(s) / UC Diagnoses   Final diagnoses:  Essential hypertension     Discharge Instructions     Follow up as scheduled with cardiologist and primary care provider    ED Prescriptions    Medication Sig Dispense Auth. Provider   losartan (COZAAR) 25 MG tablet Take 2 tablets (50 mg total) by mouth daily. 30 tablet Norval Gable, MD      1. diagnosis reviewed  with patient 2. rx as per orders above; reviewed possible side effects, interactions, risks and benefits; increase losartan to 2 tabs (50mg ) daily 3. Follow up with PCP for chronic health condition management   Controlled Substance Prescriptions [place] Controlled Substance Registry consulted? Not Applicable   Norval Gable, MD 11/05/18 [phone]

## 2018-11-05 NOTE — Discharge Instructions (Signed)
Follow up as scheduled with cardiologist and primary care provider

## 2018-11-07 ENCOUNTER — Ambulatory Visit: Payer: PPO | Admitting: Family

## 2018-11-07 ENCOUNTER — Inpatient Hospital Stay
Admission: EM | Admit: 2018-11-07 | Discharge: 2018-11-08 | DRG: 065 | Disposition: A | Discharge status: Home / Self Care | Payer: PPO | Attending: Family Medicine | Admitting: Family Medicine

## 2018-11-07 ENCOUNTER — Other Ambulatory Visit: Payer: Self-pay

## 2018-11-07 ENCOUNTER — Telehealth: Payer: Self-pay

## 2018-11-07 ENCOUNTER — Emergency Department: Payer: PPO

## 2018-11-07 ENCOUNTER — Ambulatory Visit: Payer: Self-pay

## 2018-11-07 ENCOUNTER — Encounter: Payer: Self-pay | Admitting: Emergency Medicine

## 2018-11-07 DIAGNOSIS — Z825 Family history of asthma and other chronic lower respiratory diseases: Secondary | ICD-10-CM | POA: Diagnosis not present

## 2018-11-07 DIAGNOSIS — I10 Essential (primary) hypertension: Secondary | ICD-10-CM | POA: Diagnosis not present

## 2018-11-07 DIAGNOSIS — Z79890 Hormone replacement therapy (postmenopausal): Secondary | ICD-10-CM

## 2018-11-07 DIAGNOSIS — L930 Discoid lupus erythematosus: Secondary | ICD-10-CM | POA: Diagnosis present

## 2018-11-07 DIAGNOSIS — Z85850 Personal history of malignant neoplasm of thyroid: Secondary | ICD-10-CM | POA: Diagnosis not present

## 2018-11-07 DIAGNOSIS — N183 Chronic kidney disease, stage 3 (moderate): Secondary | ICD-10-CM | POA: Diagnosis present

## 2018-11-07 DIAGNOSIS — I5022 Chronic systolic (congestive) heart failure: Secondary | ICD-10-CM | POA: Diagnosis not present

## 2018-11-07 DIAGNOSIS — R29700 NIHSS score 0: Secondary | ICD-10-CM | POA: Diagnosis present

## 2018-11-07 DIAGNOSIS — I361 Nonrheumatic tricuspid (valve) insufficiency: Secondary | ICD-10-CM | POA: Diagnosis not present

## 2018-11-07 DIAGNOSIS — E871 Hypo-osmolality and hyponatremia: Secondary | ICD-10-CM

## 2018-11-07 DIAGNOSIS — R531 Weakness: Secondary | ICD-10-CM | POA: Diagnosis not present

## 2018-11-07 DIAGNOSIS — Z885 Allergy status to narcotic agent status: Secondary | ICD-10-CM

## 2018-11-07 DIAGNOSIS — I252 Old myocardial infarction: Secondary | ICD-10-CM | POA: Diagnosis not present

## 2018-11-07 DIAGNOSIS — F419 Anxiety disorder, unspecified: Secondary | ICD-10-CM | POA: Diagnosis present

## 2018-11-07 DIAGNOSIS — Z9012 Acquired absence of left breast and nipple: Secondary | ICD-10-CM | POA: Diagnosis not present

## 2018-11-07 DIAGNOSIS — F329 Major depressive disorder, single episode, unspecified: Secondary | ICD-10-CM | POA: Diagnosis not present

## 2018-11-07 DIAGNOSIS — Z79811 Long term (current) use of aromatase inhibitors: Secondary | ICD-10-CM

## 2018-11-07 DIAGNOSIS — I2510 Atherosclerotic heart disease of native coronary artery without angina pectoris: Secondary | ICD-10-CM | POA: Diagnosis not present

## 2018-11-07 DIAGNOSIS — K219 Gastro-esophageal reflux disease without esophagitis: Secondary | ICD-10-CM | POA: Diagnosis not present

## 2018-11-07 DIAGNOSIS — Z841 Family history of disorders of kidney and ureter: Secondary | ICD-10-CM | POA: Diagnosis not present

## 2018-11-07 DIAGNOSIS — I6389 Other cerebral infarction: Secondary | ICD-10-CM

## 2018-11-07 DIAGNOSIS — G8194 Hemiplegia, unspecified affecting left nondominant side: Secondary | ICD-10-CM | POA: Diagnosis not present

## 2018-11-07 DIAGNOSIS — I639 Cerebral infarction, unspecified: Secondary | ICD-10-CM | POA: Diagnosis not present

## 2018-11-07 DIAGNOSIS — Z8249 Family history of ischemic heart disease and other diseases of the circulatory system: Secondary | ICD-10-CM

## 2018-11-07 DIAGNOSIS — Z79899 Other long term (current) drug therapy: Secondary | ICD-10-CM

## 2018-11-07 DIAGNOSIS — Z66 Do not resuscitate: Secondary | ICD-10-CM | POA: Diagnosis not present

## 2018-11-07 DIAGNOSIS — Z7982 Long term (current) use of aspirin: Secondary | ICD-10-CM

## 2018-11-07 DIAGNOSIS — I130 Hypertensive heart and chronic kidney disease with heart failure and stage 1 through stage 4 chronic kidney disease, or unspecified chronic kidney disease: Secondary | ICD-10-CM | POA: Diagnosis not present

## 2018-11-07 DIAGNOSIS — Z880 Allergy status to penicillin: Secondary | ICD-10-CM

## 2018-11-07 DIAGNOSIS — M3500 Sicca syndrome, unspecified: Secondary | ICD-10-CM | POA: Diagnosis present

## 2018-11-07 DIAGNOSIS — C50919 Malignant neoplasm of unspecified site of unspecified female breast: Secondary | ICD-10-CM | POA: Diagnosis not present

## 2018-11-07 DIAGNOSIS — Z882 Allergy status to sulfonamides status: Secondary | ICD-10-CM

## 2018-11-07 DIAGNOSIS — Z170 Estrogen receptor positive status [ER+]: Secondary | ICD-10-CM | POA: Diagnosis not present

## 2018-11-07 DIAGNOSIS — E890 Postprocedural hypothyroidism: Secondary | ICD-10-CM | POA: Diagnosis present

## 2018-11-07 DIAGNOSIS — C50912 Malignant neoplasm of unspecified site of left female breast: Secondary | ICD-10-CM | POA: Diagnosis present

## 2018-11-07 DIAGNOSIS — M810 Age-related osteoporosis without current pathological fracture: Secondary | ICD-10-CM | POA: Diagnosis present

## 2018-11-07 DIAGNOSIS — Z800 Family history of malignant neoplasm of digestive organs: Secondary | ICD-10-CM

## 2018-11-07 DIAGNOSIS — I371 Nonrheumatic pulmonary valve insufficiency: Secondary | ICD-10-CM | POA: Diagnosis not present

## 2018-11-07 DIAGNOSIS — Z886 Allergy status to analgesic agent status: Secondary | ICD-10-CM

## 2018-11-07 DIAGNOSIS — I6381 Other cerebral infarction due to occlusion or stenosis of small artery: Secondary | ICD-10-CM | POA: Diagnosis not present

## 2018-11-07 DIAGNOSIS — Z9089 Acquired absence of other organs: Secondary | ICD-10-CM | POA: Diagnosis not present

## 2018-11-07 DIAGNOSIS — Z888 Allergy status to other drugs, medicaments and biological substances status: Secondary | ICD-10-CM

## 2018-11-07 DIAGNOSIS — Z79891 Long term (current) use of opiate analgesic: Secondary | ICD-10-CM

## 2018-11-07 LAB — URINALYSIS, COMPLETE (UACMP) WITH MICROSCOPIC
Bacteria, UA: NONE SEEN
Bilirubin Urine: NEGATIVE
Glucose, UA: NEGATIVE mg/dL
Hgb urine dipstick: NEGATIVE
Ketones, ur: NEGATIVE mg/dL
LEUKOCYTE UA: NEGATIVE
Nitrite: NEGATIVE
Protein, ur: NEGATIVE mg/dL
Specific Gravity, Urine: 1.005 (ref 1.005–1.030)
Squamous Epithelial / HPF: NONE SEEN (ref 0–5)
pH: 6 (ref 5.0–8.0)

## 2018-11-07 LAB — LIPID PANEL
Cholesterol: 143 mg/dL (ref 0–200)
HDL: 36 mg/dL — ABNORMAL LOW (ref >40)
LDL Cholesterol: 95 mg/dL (ref 0–99)
Total CHOL/HDL Ratio: 4 RATIO
Triglycerides: 62 mg/dL (ref <150)
VLDL: 12 mg/dL (ref 0–40)

## 2018-11-07 LAB — COMPREHENSIVE METABOLIC PANEL
ALT: 10 U/L (ref 0–44)
AST: 25 U/L (ref 15–41)
Albumin: 3.8 g/dL (ref 3.5–5.0)
Alkaline Phosphatase: 70 U/L (ref 38–126)
Anion gap: 7 (ref 5–15)
BUN: 12 mg/dL (ref 8–23)
CO2: 26 mmol/L (ref 22–32)
Calcium: 8.6 mg/dL — ABNORMAL LOW (ref 8.9–10.3)
Chloride: 99 mmol/L (ref 98–111)
Creatinine, Ser: 0.54 mg/dL (ref 0.44–1.00)
GFR calc non Af Amer: >60 mL/min (ref >60)
Glucose, Bld: 102 mg/dL — ABNORMAL HIGH (ref 70–99)
Potassium: 4.9 mmol/L (ref 3.5–5.1)
Sodium: 132 mmol/L — ABNORMAL LOW (ref 135–145)
Total Bilirubin: 0.6 mg/dL (ref 0.3–1.2)
Total Protein: 6.7 g/dL (ref 6.5–8.1)

## 2018-11-07 LAB — CBC WITH DIFFERENTIAL/PLATELET
Abs Immature Granulocytes: 0.09 10*3/uL — ABNORMAL HIGH (ref 0.00–0.07)
Basophils Absolute: 0.1 10*3/uL (ref 0.0–0.1)
Basophils Relative: 2 %
Eosinophils Absolute: 0.1 10*3/uL (ref 0.0–0.5)
Eosinophils Relative: 3 %
HEMATOCRIT: 32.9 % — AB (ref 36.0–46.0)
Hemoglobin: 11.2 g/dL — ABNORMAL LOW (ref 12.0–15.0)
Immature Granulocytes: 3 %
Lymphocytes Relative: 23 %
Lymphs Abs: 0.8 10*3/uL (ref 0.7–4.0)
MCH: 33 pg (ref 26.0–34.0)
MCHC: 34 g/dL (ref 30.0–36.0)
MCV: 97.1 fL (ref 80.0–100.0)
Monocytes Absolute: 0.4 10*3/uL (ref 0.1–1.0)
Monocytes Relative: 11 %
Neutro Abs: 2 10*3/uL (ref 1.7–7.7)
Neutrophils Relative %: 58 %
Platelets: 152 10*3/uL (ref 150–400)
RBC: 3.39 MIL/uL — ABNORMAL LOW (ref 3.87–5.11)
RDW: 14.6 % (ref 11.5–15.5)
WBC: 3.3 10*3/uL — ABNORMAL LOW (ref 4.0–10.5)
nRBC: 0 % (ref 0.0–0.2)

## 2018-11-07 LAB — PROTIME-INR
INR: 0.9 (ref 0.8–1.2)
Prothrombin Time: 12.1 seconds (ref 11.4–15.2)

## 2018-11-07 LAB — CBC
HCT: 32.4 % — ABNORMAL LOW (ref 36.0–46.0)
Hemoglobin: 11.1 g/dL — ABNORMAL LOW (ref 12.0–15.0)
MCH: 32.9 pg (ref 26.0–34.0)
MCHC: 34.3 g/dL (ref 30.0–36.0)
MCV: 96.1 fL (ref 80.0–100.0)
NRBC: 0 % (ref 0.0–0.2)
Platelets: 141 10*3/uL — ABNORMAL LOW (ref 150–400)
RBC: 3.37 MIL/uL — AB (ref 3.87–5.11)
RDW: 14.3 % (ref 11.5–15.5)
WBC: 3.4 10*3/uL — ABNORMAL LOW (ref 4.0–10.5)

## 2018-11-07 LAB — APTT: aPTT: 30 seconds (ref 24–36)

## 2018-11-07 MED ORDER — SERTRALINE HCL 50 MG PO TABS
50.0000 mg | ORAL_TABLET | Freq: Every day | ORAL | Status: DC
  Administered 2018-11-08: 50 mg via ORAL
  Filled 2018-11-07: qty 1

## 2018-11-07 MED ORDER — HYDRALAZINE HCL 20 MG/ML IJ SOLN: 10.0000 mg | Freq: Four times a day (QID) | INTRAMUSCULAR | Status: DC | PRN

## 2018-11-07 MED ORDER — ACETAMINOPHEN 650 MG RE SUPP: 650.0000 mg | RECTAL | Status: DC | PRN

## 2018-11-07 MED ORDER — NITROGLYCERIN 0.4 MG SL SUBL: 0.4000 mg | SUBLINGUAL_TABLET | SUBLINGUAL | Status: DC | PRN

## 2018-11-07 MED ORDER — ASPIRIN EC 81 MG PO TBEC
81.0000 mg | DELAYED_RELEASE_TABLET | Freq: Every day | ORAL | Status: DC
  Administered 2018-11-08: 81 mg via ORAL
  Filled 2018-11-07: qty 1

## 2018-11-07 MED ORDER — VITAMIN D 25 MCG (1000 UNIT) PO TABS
2000.0000 [IU] | ORAL_TABLET | Freq: Every day | ORAL | Status: DC
  Administered 2018-11-08: 10:00:00 2000 [IU] via ORAL
  Filled 2018-11-07 (×2): qty 2

## 2018-11-07 MED ORDER — LOSARTAN POTASSIUM 50 MG PO TABS
50.0000 mg | ORAL_TABLET | Freq: Every day | ORAL | Status: DC
  Administered 2018-11-08: 10:00:00 50 mg via ORAL
  Filled 2018-11-07: qty 1

## 2018-11-07 MED ORDER — LEVOTHYROXINE SODIUM 50 MCG PO TABS: 75.0000 ug | ORAL_TABLET | Freq: Every day | ORAL | Status: DC

## 2018-11-07 MED ORDER — ACETAMINOPHEN 160 MG/5ML PO SOLN
650.0000 mg | ORAL | Status: DC | PRN
  Filled 2018-11-07: qty 20.3

## 2018-11-07 MED ORDER — CALCIUM CARBONATE-VITAMIN D 500-200 MG-UNIT PO TABS
2.0000 | ORAL_TABLET | Freq: Two times a day (BID) | ORAL | Status: DC
  Administered 2018-11-08: 2 via ORAL
  Filled 2018-11-07: qty 2

## 2018-11-07 MED ORDER — MIRTAZAPINE 15 MG PO TABS
7.5000 mg | ORAL_TABLET | Freq: Every day | ORAL | Status: DC
  Administered 2018-11-07: 23:00:00 7.5 mg via ORAL
  Filled 2018-11-07: qty 1

## 2018-11-07 MED ORDER — ASPIRIN 81 MG PO CHEW
324.0000 mg | CHEWABLE_TABLET | Freq: Once | ORAL | Status: AC
  Administered 2018-11-07: 324 mg via ORAL
  Filled 2018-11-07: qty 4

## 2018-11-07 MED ORDER — ONDANSETRON 4 MG PO TBDP
4.0000 mg | ORAL_TABLET | Freq: Three times a day (TID) | ORAL | Status: DC | PRN
  Filled 2018-11-07: qty 1

## 2018-11-07 MED ORDER — ENOXAPARIN SODIUM 40 MG/0.4ML ~~LOC~~ SOLN
40.0000 mg | SUBCUTANEOUS | Status: DC
  Administered 2018-11-07: 40 mg via SUBCUTANEOUS
  Filled 2018-11-07: qty 0.4

## 2018-11-07 MED ORDER — PANTOPRAZOLE SODIUM 40 MG PO TBEC
40.0000 mg | DELAYED_RELEASE_TABLET | Freq: Every day | ORAL | Status: DC
  Administered 2018-11-08: 40 mg via ORAL
  Filled 2018-11-07: qty 1

## 2018-11-07 MED ORDER — POLYETHYLENE GLYCOL 3350 17 G PO PACK
17.0000 g | PACK | Freq: Every day | ORAL | Status: DC
  Administered 2018-11-07: 23:00:00 17 g via ORAL
  Filled 2018-11-07: qty 1

## 2018-11-07 MED ORDER — STROKE: EARLY STAGES OF RECOVERY BOOK
Freq: Once | Status: AC
  Administered 2018-11-07: 20:00:00

## 2018-11-07 MED ORDER — CLOPIDOGREL BISULFATE 75 MG PO TABS
75.0000 mg | ORAL_TABLET | Freq: Every day | ORAL | Status: DC
  Administered 2018-11-07 – 2018-11-08 (×2): 75 mg via ORAL
  Filled 2018-11-07 (×2): qty 1

## 2018-11-07 MED ORDER — LETROZOLE 2.5 MG PO TABS
2.5000 mg | ORAL_TABLET | Freq: Every day | ORAL | Status: DC
  Filled 2018-11-07: qty 1

## 2018-11-07 MED ORDER — ACETAMINOPHEN 325 MG PO TABS
650.0000 mg | ORAL_TABLET | ORAL | Status: DC | PRN
  Filled 2018-11-07: qty 2

## 2018-11-07 MED ORDER — METOPROLOL SUCCINATE ER 25 MG PO TB24
25.0000 mg | ORAL_TABLET | Freq: Every day | ORAL | Status: DC
  Administered 2018-11-08: 10:00:00 25 mg via ORAL
  Filled 2018-11-07: qty 1

## 2018-11-07 MED ORDER — LEVOTHYROXINE SODIUM 50 MCG PO TABS
75.0000 ug | ORAL_TABLET | Freq: Every day | ORAL | Status: DC
  Administered 2018-11-07: 23:00:00 75 ug via ORAL
  Filled 2018-11-07: qty 1

## 2018-11-07 MED ORDER — SIMVASTATIN 20 MG PO TABS: 20.0000 mg | ORAL_TABLET | Freq: Every day | ORAL | Status: DC

## 2018-11-07 MED ORDER — TIZANIDINE HCL 2 MG PO TABS
2.0000 mg | ORAL_TABLET | Freq: Three times a day (TID) | ORAL | Status: DC
  Administered 2018-11-08: 10:00:00 2 mg via ORAL
  Filled 2018-11-07 (×4): qty 1

## 2018-11-07 NOTE — Progress Notes (Signed)
Family Meeting Note  Advance Directive:yes  Today a meeting took place with the Patient.daughter The following clinical team members were present during this meeting:MD  The following were discussed:Patient's diagnosis: cva, Patient's progosis: > 12 months and Goals for treatment: DNR  Additional follow-up to be provided: dnr No change in AD  Time spent during discussion:16 minutes  Benen Weida, MD

## 2018-11-07 NOTE — ED Notes (Signed)
PAtient reports on Friday having Left arm weakness with c/o not being able to hold anything and then her left leg was weak and not being able to put weight on leg and ambulate. Reports symptoms have resolved mostly but can still feel the weakness in her Left leg. Also c/o high blood pressure and was seen by urgent care on Saturday who increased her losartan. PAtien thas followed up with her PCP on blood pressure medicine/HTN

## 2018-11-07 NOTE — ED Notes (Signed)
ED TO INPATIENT HANDOFF REPORT  ED Nurse Name and Phone #:  Ophelia Charter [phone]  S Name/Age/Gender Kim Santiago 84 y.o. female Room/Bed: ED08A/ED08A  Code Status   Code Status: DNR  Home/SNF/Other home Patient oriented to self, person, place, time Is this baseline?yes Triage Complete: Triage complete  Chief Complaint high bp/left sided weakness  Triage Note Patient presents to the ED with left sided weakness since Friday.  Patient states on Friday, "my arm felt like it wasn't there and my leg didn't feel like it could hold me."  Patient states symptoms have improved somewhat but she is still having some residual weakness.  Patient states she went to urgent care on Saturday and they increased her dose of losartan.  Patient also reports pressure in her head and slight headache.     Allergies Allergies  Allergen Reactions  . Azathioprine Nausea And Vomiting    Severe vomiting  . Hydroxychloroquine Hives and Nausea And Vomiting  . Mycophenolate Mofetil Nausea Only  . Amoxicillin Rash    Has patient had a PCN reaction causing immediate rash, facial/tongue/throat swelling, SOB or lightheadedness with hypotension: Unknown Has patient had a PCN reaction causing severe rash involving mucus membranes or skin necrosis: Unknown Has patient had a PCN reaction that required hospitalization: Unknown Has patient had a PCN reaction occurring within the last 10 years: Unknown If all of the above answers are "NO", then may proceed with Cephalosporin use. Tolerates ceftriaxone and Ancef  . Codeine Nausea And Vomiting  . Naprosyn [Naproxen] Swelling  . Orudis [Ketoprofen] Hives  . Sulfathiazole Rash    Level of Care/Admitting Diagnosis ED Disposition    ED Disposition Condition [facility] Area: [place] [[id number]]  Level of Care: Med-Surg [16]  Diagnosis: CVA (cerebral vascular accident) [facility]) [[id number]]  Admitting Physician: MODY, Ulice Bold [[id number]]   Attending Physician: MODY, Ulice Bold [[id number]]  Estimated length of stay: past midnight tomorrow  Certification:: I certify this patient will need inpatient services for at least 2 midnights  PT Class (Do Not Modify): Inpatient [101]  PT Acc Code (Do Not Modify): Private [1]       B Medical/Surgery History Past Medical History:  Diagnosis Date  . Arthritis   . Breast cancer ([place]) 2017   left mastectomy done 11/2015  . Breast cancer in female [facility]) 11/18/2015   Left: 3.9 cm tumor, T2, 1/2 sentinel nodes positive for macro metastatic disease, N1, 3 negative nodes in the axillary tail, ER+,PR+, Her 2 neu, low Mammoprint score  . Cancer [facility])    thyroid takes levothyroxine  . Chronic kidney disease    UTI  . Genetic screening March 2017.   Mammoprint of left breast cancer: Low risk for recurrence.   . Hypertension   . hypothyroidism    secondary to thyroidectomy for thyroid ca  . Hypothyroidism   . Lupus ([facility])    subcutaneous  . Menopause 40s   natural, hot flashes and mood lability now gone, off prempro 7 months  . Myocardial infarction ([place]) 2013  . Osteoporosis    Osteopenia  . Rosacea   . Sjoegren syndrome   . Stress-induced cardiomyopathy September of 2013   EF 35%. Peak troponin was 1.8.   Past Surgical History:  Procedure Laterality Date  . BACK SURGERY    . BREAST BIOPSY Left 10/30/15   positive, done in Dr. Dwyane Luo office  . CARDIAC CATHETERIZATION  05/2012   [facility]. No significant CAD. Ejection fraction of 35%  due to stress-induced cardiomyopathy.  . CHOLECYSTECTOMY    . COLONOSCOPY    . DILATION AND CURETTAGE OF UTERUS    . KYPHOSIS SURGERY  Feb 2008   L1, Dr. Mauri Pole  . LUMBAR DISC SURGERY     L4-L5  . MASTECTOMY Left 11/18/2015   positive  . SENTINEL NODE BIOPSY Left 11/18/2015   Procedure: SENTINEL NODE BIOPSY;  Surgeon: Robert Bellow, MD;  Location: [facility] ORS;  Service: General;  Laterality: Left;  . SHOULDER ARTHROSCOPY  2004   Left, Dr. Jefm Bryant  .  SIMPLE MASTECTOMY WITH AXILLARY SENTINEL NODE BIOPSY Left 11/18/2015   Procedure: SIMPLE MASTECTOMY;  Surgeon: Robert Bellow, MD;  Location: [facility] ORS;  Service: General;  Laterality: Left;  . SPINE SURGERY     L4-5 diskectomy  . THYROIDECTOMY     Thyroid Cancer  . TONSILLECTOMY    . TUBAL LIGATION       A IV Location/Drains/Wounds Patient Lines/Drains/Airways Status   Active Line/Drains/Airways    Name:   Placement date:   Placement time:   Site:   Days:   Peripheral IV 02/28/18 Right Hand   02/28/18    1443    Hand   252          Intake/Output Last 24 hours No intake or output data in the 24 hours ending 11/07/18 1923  Labs/Imaging Results for orders placed or performed during the hospital encounter of 11/07/18 (from the past 48 hour(s))  CBC     Status: Abnormal   Collection Time: 11/07/18 11:27 AM  Result Value Ref Range   WBC 3.4 (L) 4.0 - 10.5 K/uL   RBC 3.37 (L) 3.87 - 5.11 MIL/uL   Hemoglobin 11.1 (L) 12.0 - 15.0 g/dL   HCT 32.4 (L) 36.0 - 46.0 %   MCV 96.1 80.0 - 100.0 fL   MCH 32.9 26.0 - 34.0 pg   MCHC 34.3 30.0 - 36.0 g/dL   RDW 14.3 11.5 - 15.5 %   Platelets 141 (L) 150 - 400 K/uL   nRBC 0.0 0.0 - 0.2 %    Comment: Performed at [facility], [address] [place], [place] [address]  Protime-INR     Status: None   Collection Time: 11/07/18 11:27 AM  Result Value Ref Range   Prothrombin Time 12.1 11.4 - 15.2 seconds   INR 0.9 0.8 - 1.2    Comment: (NOTE) INR goal varies based on device and disease states. Performed at [facility], [address] [place], [place] [address]   APTT     Status: None   Collection Time: 11/07/18 11:27 AM  Result Value Ref Range   aPTT 30 24 - 36 seconds    Comment: Performed at [facility], [address] [place], [place] [address]  Comprehensive metabolic panel     Status: Abnormal   Collection Time: 11/07/18 11:27 AM  Result Value Ref Range   Sodium 132 (L) 135 - 145 mmol/L    Potassium 4.9 3.5 - 5.1 mmol/L   Chloride 99 98 - 111 mmol/L   CO2 26 22 - 32 mmol/L   Glucose, Bld 102 (H) 70 - 99 mg/dL   BUN 12 8 - 23 mg/dL   Creatinine, Ser 0.54 0.44 - 1.00 mg/dL   Calcium 8.6 (L) 8.9 - 10.3 mg/dL   Total Protein 6.7 6.5 - 8.1 g/dL   Albumin 3.8 3.5 - 5.0 g/dL   AST 25 15 - 41 U/L  ALT 10 0 - 44 U/L   Alkaline Phosphatase 70 38 - 126 U/L   Total Bilirubin 0.6 0.3 - 1.2 mg/dL   GFR calc non Af Amer >60 >60 mL/min   GFR calc Af Amer >60 >60 mL/min   Anion gap 7 5 - 15    Comment: Performed at [facility], [address] [place], [place] [address]  CBC with Differential/Platelet     Status: Abnormal   Collection Time: 11/07/18 11:27 AM  Result Value Ref Range   WBC 3.3 (L) 4.0 - 10.5 K/uL   RBC 3.39 (L) 3.87 - 5.11 MIL/uL   Hemoglobin 11.2 (L) 12.0 - 15.0 g/dL   HCT 32.9 (L) 36.0 - 46.0 %   MCV 97.1 80.0 - 100.0 fL   MCH 33.0 26.0 - 34.0 pg   MCHC 34.0 30.0 - 36.0 g/dL   RDW 14.6 11.5 - 15.5 %   Platelets 152 150 - 400 K/uL   nRBC 0.0 0.0 - 0.2 %   Neutrophils Relative % 58 %   Neutro Abs 2.0 1.7 - 7.7 K/uL   Lymphocytes Relative 23 %   Lymphs Abs 0.8 0.7 - 4.0 K/uL   Monocytes Relative 11 %   Monocytes Absolute 0.4 0.1 - 1.0 K/uL   Eosinophils Relative 3 %   Eosinophils Absolute 0.1 0.0 - 0.5 K/uL   Basophils Relative 2 %   Basophils Absolute 0.1 0.0 - 0.1 K/uL   Immature Granulocytes 3 %   Abs Immature Granulocytes 0.09 (H) 0.00 - 0.07 K/uL    Comment: Performed at [facility], [address] [place], [place] [address]  Urinalysis, Complete w Microscopic     Status: Abnormal   Collection Time: 11/07/18 11:28 AM  Result Value Ref Range   Color, Urine STRAW (A) YELLOW   APPearance CLEAR (A) CLEAR   Specific Gravity, Urine 1.005 1.005 - 1.030   pH 6.0 5.0 - 8.0   Glucose, UA NEGATIVE NEGATIVE mg/dL   Hgb urine dipstick NEGATIVE NEGATIVE   Bilirubin Urine NEGATIVE NEGATIVE   Ketones, ur NEGATIVE NEGATIVE mg/dL   Protein,  ur NEGATIVE NEGATIVE mg/dL   Nitrite NEGATIVE NEGATIVE   Leukocytes,Ua NEGATIVE NEGATIVE   RBC / HPF 0-5 0 - 5 RBC/hpf   WBC, UA 0-5 0 - 5 WBC/hpf   Bacteria, UA NONE SEEN NONE SEEN   Squamous Epithelial / LPF NONE SEEN 0 - 5    Comment: Performed at [facility], [place], [place] [address]   Ct Head Wo Contrast  Result Date: 11/07/2018 CLINICAL DATA:  84 year old with possible stroke. EXAM: CT HEAD WITHOUT CONTRAST TECHNIQUE: Contiguous axial images were obtained from the base of the skull through the vertex without intravenous contrast. COMPARISON:  02/09/2018 FINDINGS: Brain: No evidence for acute hemorrhage, mass lesion, midline shift, hydrocephalus or large infarct. Again noted is low-density in the periventricular and subcortical white matter suggesting chronic changes. Vascular: No hyperdense vessel or unexpected calcification. Skull: Normal. Negative for fracture or focal lesion. Sinuses/Orbits: No acute finding. Other: None. IMPRESSION: 1. No acute intracranial abnormality. 2. Stable areas of low-density in the white matter. Findings are most compatible with chronic small vessel ischemic disease. Electronically Signed   By: Markus Daft M.D.   On: 11/07/2018 11:51    Pending Labs Unresulted Labs (From admission, onward)    Start     Ordered   11/14/18 0500  Creatinine, serum  (enoxaparin (LOVENOX)    CrCl >/= 30  ml/min)  Weekly,   STAT    Comments:  while on enoxaparin therapy    11/07/18 1911   11/08/18 0500  Hemoglobin A1c  Tomorrow morning,   STAT     11/07/18 1911   11/08/18 0500  Lipid panel  Tomorrow morning,   STAT    Comments:  Fasting    11/07/18 1911   11/07/18 1908  CBC  (enoxaparin (LOVENOX)    CrCl >/= 30 ml/min)  Once,   STAT    Comments:  Baseline for enoxaparin therapy IF NOT ALREADY DRAWN.  Notify MD if PLT < 100 K.    11/07/18 1911   11/07/18 1908  Creatinine, serum  (enoxaparin (LOVENOX)    CrCl >/= 30 ml/min)  Once,   STAT     Comments:  Baseline for enoxaparin therapy IF NOT ALREADY DRAWN.    11/07/18 1911          Vitals/Pain Today's Vitals   11/07/18 1700 11/07/18 1730 11/07/18 1800 11/07/18 1900  BP: (!) 162/73 (!) 159/74 (!) 154/76 (!) 159/69  Pulse: 72 78 73 66  Resp: 18 (!) 24 16 14   Temp:      TempSrc:      SpO2: 98% 100% 98% 100%  Weight:      Height:      PainSc:        Isolation Precautions No active isolations  Medications Medications  cholecalciferol (VITAMIN D) tablet 2,000 Units (has no administration in time range)  Calcium Carbonate-Vitamin D 600-200 MG-UNIT TABS 2 tablet (has no administration in time range)  aspirin EC tablet 81 mg (has no administration in time range)  polyethylene glycol (MIRALAX / GLYCOLAX) packet 17 g (has no administration in time range)  tizanidine (ZANAFLEX) capsule 2 mg (has no administration in time range)  ondansetron (ZOFRAN-ODT) disintegrating tablet 4 mg (has no administration in time range)  nitroGLYCERIN (NITROSTAT) SL tablet 0.4 mg (has no administration in time range)  sertraline (ZOLOFT) tablet 50 mg (has no administration in time range)  metoprolol succinate (TOPROL-XL) 24 hr tablet 25 mg (has no administration in time range)  pantoprazole (PROTONIX) EC tablet 40 mg (has no administration in time range)  letrozole [facility]) tablet 2.5 mg (has no administration in time range)  levothyroxine (SYNTHROID, LEVOTHROID) tablet 75 mcg (has no administration in time range)  mirtazapine (REMERON) tablet 7.5 mg (has no administration in time range)  losartan (COZAAR) tablet 50 mg (has no administration in time range)   stroke: mapping our early stages of recovery book (has no administration in time range)  acetaminophen (TYLENOL) tablet 650 mg (has no administration in time range)    Or  acetaminophen (TYLENOL) solution 650 mg (has no administration in time range)    Or  acetaminophen (TYLENOL) suppository 650 mg (has no administration in time  range)  enoxaparin (LOVENOX) injection 40 mg (has no administration in time range)  aspirin chewable tablet 324 mg (324 mg Oral Given 11/07/18 1818)    Mobility walks Low fall risk   Focused Assessments Neuro Assessment Handoff:  Swallow screen pass? Yes  Cardiac Rhythm: Normal sinus rhythm NIH Stroke Scale ( + Modified Stroke Scale Criteria)  Level of Consciousness (1a.)   : Alert, keenly responsive LOC Questions (1b. )   +: Answers both questions correctly LOC Commands (1c. )   + : Performs both tasks correctly Best Gaze (2. )  +: Normal Visual (3. )  +: No visual loss Facial Palsy (4. )    :  Normal symmetrical movements Motor Arm, Left (5a. )   +: No drift Motor Arm, Right (5b. )   +: No drift Motor Leg, Left (6a. )   +: No drift Motor Leg, Right (6b. )   +: No drift Limb Ataxia (7. ): Absent Sensory (8. )   +: Normal, no sensory loss Best Language (9. )   +: No aphasia Dysarthria (10. ): Normal Extinction/Inattention (11.)   +: No Abnormality Modified SS Total  +: 0 Complete NIHSS TOTAL: 0     Neuro Assessment: Within Defined Limits Neuro Checks:      Last Documented NIHSS Modified Score: 0 (11/07/18 1629) Has TPA been given? No If patient is a Neuro Trauma and patient is going to OR before floor call report to [place] nurse: [phone] or [phone]     R Recommendations: See Admitting Provider Note  Report given to:   Additional Notes:  None

## 2018-11-07 NOTE — ED Notes (Signed)
MRI pre-screened patient

## 2018-11-07 NOTE — ED Triage Notes (Signed)
Patient presents to the ED with left sided weakness since Friday.  Patient states on Friday, "my arm felt like it wasn't there and my leg didn't feel like it could hold me."  Patient states symptoms have improved somewhat but she is still having some residual weakness.  Patient states she went to urgent care on Saturday and they increased her dose of losartan.  Patient also reports pressure in her head and slight headache.

## 2018-11-07 NOTE — Telephone Encounter (Signed)
Copied from [place] [phone]. Topic: General - Other >> Nov 07, 2018  4:02 PM Valla Leaver wrote: Reason for CRM: Daughter [name], calling to let Juliann Pulse know that they went to ED but they are just going back to see the doctor so she won't have an update before closing

## 2018-11-07 NOTE — ED Notes (Signed)
Patient being transported to MRI at this time.

## 2018-11-07 NOTE — Consult Note (Signed)
   [facility]  Impression:  Probable lacunar infarct of the right hemispheric deep brain structures. No emergent intervention indicated, as she is well outside the window for a safe stroke intervention.  Recommendations:  Antiplatelet Permissive HTN Statin Euglycemia MRI brain wo Carotid Doppler TTE Monitor on tele Routine consult with neurology for follow up ---------------------------------------------------------------------  CC: STAT consult request by ED for left-sided weakness  History of Present Illness:  Pt with a history of HTN, HLD.  Last normal on 2/29 in the morning, but in the afternoon she developed left leg weakness and numbness in the left arm/leg.  No speech/language changes, vision loss, HA, CP.  Was having trouble walking at first, but now can ambulate, as symptoms have improved a little.  Diagnostic Testing: CT head wo - nothing acute  Vital Signs:   reviewed in EMR  Exam:  Mental Status:  Awake, alert, oriented  Naming: Intact Repetition: Intact   Speech: fluent  Cranial Nerves:  Pupils: Equal round and reactive to light Extraocular movements: Intact in all cardinal gaze Ptosis: Absent Visual fields: Intact to finger counting Facial sensation: Intact to pin and light touch Facial movements: Intact and symmetric    Motor Exam:  Subtle drift LLE, otherwise no drift elsewhere   Tremor/Abnormal Movements:  Resting tremor: Absent Intention tremor: Absent Postural tremor: Absent  Sensory Exam:   Reduced LLE    Coordination:   Finger to nose: Intact Heel to shin: Intact  Medical Decision Making:  - Extensive number of diagnosis or management options are considered above.   - Extensive amount of complex data reviewed.   - High risk of complication and/or morbidity or mortality are associated with differential diagnostic considerations above.  - There may be uncertain outcome and increased probability of  prolonged functional impairment or high probability of severe prolonged functional impairment associated with some of these differential diagnosis.   Medical Data Reviewed:  1.Data reviewed include clinical labs, radiology,  Medical Tests;   2.Tests results discussed w/performing or interpreting physician;   3.Obtaining/reviewing old medical records;  4.Obtaining case history from another source;  5.Independent review of image, tracing or specimen.    Patient was informed the Neurology Consult would happen via telehealth (remote video) and consented to receiving care in this manner.

## 2018-11-07 NOTE — Telephone Encounter (Signed)
Called to speak with patient , and patient stated my daughter is handling this and phone given to daughter, tried to explain that I needed to speak with patient because after reading [facility] triage note , was in agreement patient needs to to go to ER. Patient daughter stated we would rather come to office, advised nurse had spoke with PCP and in agreement patient needs to go to ER for stat CT being symptoms on going of left sided weakness with headache , pressure in head and with increases in BP. Daughter stated she would follow advice and go to ER.

## 2018-11-07 NOTE — Telephone Encounter (Signed)
Daughter called to report that the pt. went to UC this weekend, due to c/o elevated BP and left sided weakness.  Onset of left sided weakness was Friday, 2/28.  Per daughter, advised by UC MD to increase Losartan 25 mg daily to 2 tabs qd, and f/u with her PCP.  Pt. C/o "funny headache"; described as a pressure in head at 2-3/10.  C/o changes in vision left eye over past 4 days.  C/o intermittent left sided weakness and unsteadiness.  Denied dizziness, facial droop, or speech slurring.  Daughter stated my mother also c/o feeling generally weak, and of general malaise."  Stated that her mother also has anxiety, and restarted Zoloft on Saturday.  BP checked at 8:38 AM: 201/74.  Reported she feels shaky, but daughter said she thinks this is related to her anxiety.  Daughter voiced concern that the home BP monitor is not accurate.  Advised with the left sided weakness, left eye vision change, and elevated BP, she really needs to be reevaluated in the ER. Daughter verb. understanding.  Advised will send note to Dr. Derrel Nip.       Reason for Disposition . [1] Weakness of the face, arm / hand, or leg / foot on one side of the body AND [2] gradual onset (e.g., days to weeks) AND [3] present now  Answer Assessment - Initial Assessment Questions 1. SYMPTOM: "What is the main symptom you are concerned about?" (e.g., weakness, numbness)     Intermittent left sides weakness; elevated BP ; 170/69 2. ONSET: "When did this start?" (minutes, hours, days; while sleeping)    On Friday 2/28 3. LAST NORMAL: "When was the last time you were normal (no symptoms)?"     About 4-5 days ago  4. PATTERN "Does this come and go, or has it been constant since it started?"  "Is it present now?"    Left sided weakness comes and goes 5. CARDIAC SYMPTOMS: "Have you had any of the following symptoms: chest pain, difficulty breathing, palpitations?"     Denied chest pain or shortness of breath 6. NEUROLOGIC SYMPTOMS: "Have you had any of  the following symptoms: headache, dizziness, vision loss, double vision, changes in speech, unsteady on your feet?"    Headache; "funny feeling"; describes as pressure at 2-3/10; stated not seeing as well in left eye for 4 days; denied speech slurring or facial droop, feels unsteady with the left sided weakness; grip is equal per daughter   7. OTHER SYMPTOMS: "Do you have any other symptoms?"     Feels shakey; restarted Zoloft on 2/29; hx of anxiety ; BP 201/74 @ 8:38 AM 8. PREGNANCY: "Is there any chance you are pregnant?" "When was your last menstrual period?"     N/a  Protocols used: NEUROLOGIC DEFICIT-A-AH

## 2018-11-07 NOTE — H&P (Signed)
[facility] [place] NAME: Kim Santiago    MR#:  [id number]  DATE OF BIRTH:  [birth year]  DATE OF ADMISSION:  11/07/2018  PRIMARY CARE PHYSICIAN: [name], MD   REQUESTING/REFERRING PHYSICIAN: dr Mariea Clonts*  CHIEF COMPLAINT:   Left leg numbness  HISTORY OF PRESENT ILLNESS:  Kim Santiago  is a 84 y.o. female with a known history of breast cancer, hypertension and chronic kidney disease stage III who presents to the emergency room due to above issue.  Patient reports that on Friday approximately 9:30 AM she developed sudden onset of left leg and arm weakness and numbness without other focal deficits.  She was having difficulty ambulating but apparently now can ambulate.  Her symptoms largely resolved on Friday.  She was told by her primary care physician's office to go to urgent care on Saturday due to her symptoms.  Urgent care told her to follow-up with her PCP on Monday however today her PCP told her to go immediately to the emergency room to be evaluated for her symptoms that occurred on Friday.  She does report that she continues to have some on and off numbness of the left leg and arm however much improved since Friday.  Again she denies any other focal deficits.  She was evaluated by telemetry neuro while in the ER.  MRI does confirm stroke. PAST MEDICAL HISTORY:   Past Medical History:  Diagnosis Date  . Arthritis   . Breast cancer ([place]) 2017   left mastectomy done 11/2015  . Breast cancer in female [facility]) 11/18/2015   Left: 3.9 cm tumor, T2, 1/2 sentinel nodes positive for macro metastatic disease, N1, 3 negative nodes in the axillary tail, ER+,PR+, Her 2 neu, low Mammoprint score  . Cancer [facility])    thyroid takes levothyroxine  . Chronic kidney disease    UTI  . Genetic screening March 2017.   Mammoprint of left breast cancer: Low risk for recurrence.   . Hypertension   . hypothyroidism    secondary to thyroidectomy for thyroid  ca  . Hypothyroidism   . Lupus ([facility])    subcutaneous  . Menopause 40s   natural, hot flashes and mood lability now gone, off prempro 7 months  . Myocardial infarction ([place]) 2013  . Osteoporosis    Osteopenia  . Rosacea   . Sjoegren syndrome   . Stress-induced cardiomyopathy September of 2013   EF 35%. Peak troponin was 1.8.    PAST SURGICAL HISTORY:   Past Surgical History:  Procedure Laterality Date  . BACK SURGERY    . BREAST BIOPSY Left 10/30/15   positive, done in Dr. Dwyane Luo office  . CARDIAC CATHETERIZATION  05/2012   [facility]. No significant CAD. Ejection fraction of 35% due to stress-induced cardiomyopathy.  . CHOLECYSTECTOMY    . COLONOSCOPY    . DILATION AND CURETTAGE OF UTERUS    . KYPHOSIS SURGERY  Feb 2008   L1, Dr. Mauri Pole  . LUMBAR DISC SURGERY     L4-L5  . MASTECTOMY Left 11/18/2015   positive  . SENTINEL NODE BIOPSY Left 11/18/2015   Procedure: SENTINEL NODE BIOPSY;  Surgeon: Robert Bellow, MD;  Location: [facility] ORS;  Service: General;  Laterality: Left;  . SHOULDER ARTHROSCOPY  2004   Left, Dr. Jefm Bryant  . SIMPLE MASTECTOMY WITH AXILLARY SENTINEL NODE BIOPSY Left 11/18/2015   Procedure: SIMPLE MASTECTOMY;  Surgeon: Robert Bellow, MD;  Location: [facility] ORS;  Service: General;  Laterality: Left;  . SPINE SURGERY     L4-5 diskectomy  . THYROIDECTOMY     Thyroid Cancer  . TONSILLECTOMY    . TUBAL LIGATION      SOCIAL HISTORY:   Social History   Tobacco Use  . Smoking status: Never Smoker  . Smokeless tobacco: Never Used  Substance Use Topics  . Alcohol use: No    FAMILY HISTORY:   Family History  Problem Relation Age of Onset  . COPD Father   . Cancer Father        esophageal  . Kidney disease Mother   . Heart disease Mother   . Kidney disease Sister   . Cancer Brother 74       colon cancer (both brothers)  . Heart attack Brother 60  . Heart disease Brother   . Breast cancer Neg Hx     DRUG ALLERGIES:   Allergies  Allergen  Reactions  . Azathioprine Nausea And Vomiting    Severe vomiting  . Hydroxychloroquine Hives and Nausea And Vomiting  . Mycophenolate Mofetil Nausea Only  . Amoxicillin Rash    Has patient had a PCN reaction causing immediate rash, facial/tongue/throat swelling, SOB or lightheadedness with hypotension: Unknown Has patient had a PCN reaction causing severe rash involving mucus membranes or skin necrosis: Unknown Has patient had a PCN reaction that required hospitalization: Unknown Has patient had a PCN reaction occurring within the last 10 years: Unknown If all of the above answers are "NO", then may proceed with Cephalosporin use. Tolerates ceftriaxone and Ancef  . Codeine Nausea And Vomiting  . Naprosyn [Naproxen] Swelling  . Orudis [Ketoprofen] Hives  . Sulfathiazole Rash    REVIEW OF SYSTEMS:   Review of Systems  Constitutional: Negative.  Negative for chills, fever and malaise/fatigue.  HENT: Negative.  Negative for ear discharge, ear pain, hearing loss, nosebleeds and sore throat.   Eyes: Negative.  Negative for blurred vision and pain.  Respiratory: Negative.  Negative for cough, hemoptysis, shortness of breath and wheezing.   Cardiovascular: Negative.  Negative for chest pain, palpitations and leg swelling.  Gastrointestinal: Negative.  Negative for abdominal pain, blood in stool, diarrhea, nausea and vomiting.  Genitourinary: Negative.  Negative for dysuria.  Musculoskeletal: Negative.  Negative for back pain.  Skin: Negative.   Neurological: Positive for sensory change, focal weakness and headaches. Negative for dizziness, tremors, speech change and seizures.  Endo/Heme/Allergies: Negative.  Does not bruise/bleed easily.  Psychiatric/Behavioral: Negative.  Negative for depression, hallucinations and suicidal ideas.    MEDICATIONS AT HOME:   Prior to Admission medications   Medication Sig Start Date End Date Taking? Authorizing Provider  aspirin 81 MG tablet Take 81 mg  by mouth daily.   Yes [provider]  Calcium Carbonate-Vitamin D (CALCIUM + D) 600-200 MG-UNIT TABS Take 2 tablets by mouth 2 (two) times daily.    Yes [provider]  cholecalciferol (VITAMIN D) 1000 UNITS tablet Take 2,000 Units by mouth daily.    Yes [provider]  CRANBERRY PO Take 1 capsule by mouth 2 (two) times daily.   Yes [provider]  letrozole (FEMARA) 2.5 MG tablet Take 1 tablet (2.5 mg total) by mouth daily. 08/03/18  Yes Karen Kitchens, NP  levothyroxine (SYNTHROID, LEVOTHROID) 75 MCG tablet Take 1 tablet by mouth (93mcg total) daily 08/15/18  Yes [name], MD  losartan (COZAAR) 25 MG tablet Take 2 tablets (50 mg total) by mouth daily. 11/05/18  Yes  Norval Gable, MD  metoprolol succinate (TOPROL-XL) 25 MG 24 hr tablet Take 1 tablet by mouth daily 08/02/18  Yes [name], MD  mirtazapine (REMERON) 7.5 MG tablet Take 1 tablet (7.5 mg total) by mouth at bedtime. 10/13/18  Yes [name], MD  Probiotic Product (PROBIOTIC PO) Take 1 tablet by mouth daily.   Yes [provider]  sertraline (ZOLOFT) 50 MG tablet TAKE 1 TABLET BY MOUTH EVERY DAY 07/26/18  Yes [name], MD  sodium fluoride (DENTA 5000 PLUS) 1.1 % CREA dental cream Take 1 application by mouth daily. 09/15/18  Yes [provider]  ciprofloxacin (CIPRO) 500 MG tablet Take 1 tablet (500 mg total) by mouth 2 (two) times daily. Patient not taking: Reported on 11/07/2018 09/23/18   Hollice Espy, MD  furosemide (LASIX) 20 MG tablet 1 tablet every other day as needed 02/16/18   [name], MD  metolazone (ZAROXOLYN) 2.5 MG tablet TAKE 1 TABLET (2.5 MG TOTAL) BY MOUTH DAILY. [place] DOSE 03/31/18   [name], MD  nitroGLYCERIN (NITROSTAT) 0.4 MG SL tablet Place 1 tablet (0.4 mg total) under the tongue every 5 (five) minutes as needed for chest pain. Maximum 3 doses 07/04/18   [name], MD  omeprazole (PRILOSEC) 20 MG  capsule Take 1 capsule by mouth twice a day before a meal 08/02/18   [name], MD  ondansetron (ZOFRAN ODT) 4 MG disintegrating tablet Take 1 tablet (4 mg total) by mouth every 8 (eight) hours as needed for nausea or vomiting. 02/28/18   Eula Listen, MD  polyethylene glycol (MIRALAX / GLYCOLAX) packet Take 17 g by mouth daily.    [provider]  tizanidine (ZANAFLEX) 2 MG capsule Take 1 capsule (2 mg total) by mouth 3 (three) times daily. 02/16/18   [name], MD  traMADol (ULTRAM) 50 MG tablet Take 1 tablet (50 mg total) by mouth every 8 (eight) hours as needed. 02/16/18   [name], MD  triamcinolone ointment (KENALOG) 0.1 % Apply 1 application topically daily. 03/31/18   [provider]      VITAL SIGNS:  Blood pressure (!) 159/69, pulse 66, temperature 98.8 F (37.1 C), temperature source Oral, resp. rate 14, height 5\' 4"  (1.626 m), weight 60.3 kg, SpO2 100 %.  PHYSICAL EXAMINATION:   Physical Exam Constitutional:      General: She is not in acute distress. HENT:     Head: Normocephalic.     Mouth/Throat:     Mouth: Mucous membranes are moist.  Eyes:     General: No scleral icterus. Neck:     Musculoskeletal: Normal range of motion and neck supple.     Vascular: No JVD.     Trachea: No tracheal deviation.  Cardiovascular:     Rate and Rhythm: Normal rate and regular rhythm.     Heart sounds: Normal heart sounds. No murmur. No friction rub. No gallop.   Pulmonary:     Effort: Pulmonary effort is normal. No respiratory distress.     Breath sounds: Normal breath sounds. No wheezing or rales.  Chest:     Chest wall: No tenderness.  Abdominal:     General: Bowel sounds are normal. There is no distension.     Palpations: Abdomen is soft. There is no mass.     Tenderness: There is no abdominal tenderness. There is no guarding or rebound.  Musculoskeletal: Normal range of motion.  Skin:  General: Skin is warm.     Findings: No  erythema or rash.  Neurological:     Mental Status: She is alert and oriented to person, place, and time.     Motor: Weakness present.     Comments: Left LE and left UE  4+/5 decreased sensation  Psychiatric:        Judgment: Judgment normal.       LABORATORY PANEL:   CBC Recent Labs  Lab 11/07/18 1127  WBC 3.3*  3.4*  HGB 11.2*  11.1*  HCT 32.9*  32.4*  PLT 152  141*   ------------------------------------------------------------------------------------------------------------------  Chemistries  Recent Labs  Lab 11/07/18 1127  NA 132*  K 4.9  CL 99  CO2 26  GLUCOSE 102*  BUN 12  CREATININE 0.54  CALCIUM 8.6*  AST 25  ALT 10  ALKPHOS 70  BILITOT 0.6   ------------------------------------------------------------------------------------------------------------------  Cardiac Enzymes No results for input(s): TROPONINI in the last 168 hours. ------------------------------------------------------------------------------------------------------------------  RADIOLOGY:  Ct Head Wo Contrast  Result Date: 11/07/2018 CLINICAL DATA:  84 year old with possible stroke. EXAM: CT HEAD WITHOUT CONTRAST TECHNIQUE: Contiguous axial images were obtained from the base of the skull through the vertex without intravenous contrast. COMPARISON:  02/09/2018 FINDINGS: Brain: No evidence for acute hemorrhage, mass lesion, midline shift, hydrocephalus or large infarct. Again noted is low-density in the periventricular and subcortical white matter suggesting chronic changes. Vascular: No hyperdense vessel or unexpected calcification. Skull: Normal. Negative for fracture or focal lesion. Sinuses/Orbits: No acute finding. Other: None. IMPRESSION: 1. No acute intracranial abnormality. 2. Stable areas of low-density in the white matter. Findings are most compatible with chronic small vessel ischemic disease. Electronically Signed   By: Markus Daft M.D.   On: 11/07/2018 11:51   Mr Jodene Nam Head Wo  Contrast  Result Date: 11/07/2018 CLINICAL DATA:  LEFT-sided numbness. Hypertensive. History of breast cancer, Sjogren syndrome, hypertension and hyperlipidemia. EXAM: MRI HEAD WITHOUT CONTRAST MRA HEAD WITHOUT CONTRAST TECHNIQUE: Multiplanar, multiecho pulse sequences of the brain and surrounding structures were obtained without intravenous contrast. Angiographic images of the head were obtained using MRA technique without contrast. COMPARISON:  CT HEAD November 07, 2018 FINDINGS: MRI HEAD FINDINGS INTRACRANIAL CONTENTS: Faint reduced diffusion RIGHT thalamus with normalized ADC values and no signal abnormality on remaining sequences. No susceptibility artifact to suggest hemorrhage. Confluent supratentorial white matter FLAIR T2 hyperintensities. No midline shift, mass effect or masses. No parenchymal brain volume loss for age. No abnormal extra-axial fluid collections. VASCULAR: Normal major intracranial vascular flow voids present at skull base. SKULL AND UPPER CERVICAL SPINE: No abnormal sellar expansion. No suspicious calvarial bone marrow signal. Craniocervical junction maintained. SINUSES/ORBITS: The mastoid air-cells and included paranasal sinuses are well-aerated.The included ocular globes and orbital contents are non-suspicious. OTHER: None. MRA HEAD FINDINGS ANTERIOR CIRCULATION: Normal flow related enhancement of the included cervical, petrous, cavernous and supraclinoid internal carotid arteries. Patent anterior communicating artery. Patent anterior and middle cerebral arteries. Mild stenosis LEFT M2 origin. No large vessel occlusion, flow limiting stenosis, or aneurysm. POSTERIOR CIRCULATION: Codominant vertebral artery is dominant. Vertebrobasilar arteries are patent, with normal flow related enhancement of the main branch vessels. Patent posterior cerebral arteries. Small LEFT posterior communicating artery present. Fetal origin RIGHT posterior cerebral artery. No large vessel occlusion, flow limiting  stenosis, or aneurysm. ANATOMIC VARIANTS: None. Source data and MIP images were reviewed. IMPRESSION: MRI HEAD: 1. Acute versus subacute faint RIGHT thalamus nonhemorrhagic infarct. 2. Moderate to severe chronic small vessel ischemic changes. MRA HEAD: 1. No  emergent large vessel occlusion or flow-limiting stenosis. Complete circle-of-Willis. Electronically Signed   By: Elon Alas M.D.   On: 11/07/2018 19:24   Mr Brain Wo Contrast  Result Date: 11/07/2018 CLINICAL DATA:  LEFT-sided numbness. Hypertensive. History of breast cancer, Sjogren syndrome, hypertension and hyperlipidemia. EXAM: MRI HEAD WITHOUT CONTRAST MRA HEAD WITHOUT CONTRAST TECHNIQUE: Multiplanar, multiecho pulse sequences of the brain and surrounding structures were obtained without intravenous contrast. Angiographic images of the head were obtained using MRA technique without contrast. COMPARISON:  CT HEAD November 07, 2018 FINDINGS: MRI HEAD FINDINGS INTRACRANIAL CONTENTS: Faint reduced diffusion RIGHT thalamus with normalized ADC values and no signal abnormality on remaining sequences. No susceptibility artifact to suggest hemorrhage. Confluent supratentorial white matter FLAIR T2 hyperintensities. No midline shift, mass effect or masses. No parenchymal brain volume loss for age. No abnormal extra-axial fluid collections. VASCULAR: Normal major intracranial vascular flow voids present at skull base. SKULL AND UPPER CERVICAL SPINE: No abnormal sellar expansion. No suspicious calvarial bone marrow signal. Craniocervical junction maintained. SINUSES/ORBITS: The mastoid air-cells and included paranasal sinuses are well-aerated.The included ocular globes and orbital contents are non-suspicious. OTHER: None. MRA HEAD FINDINGS ANTERIOR CIRCULATION: Normal flow related enhancement of the included cervical, petrous, cavernous and supraclinoid internal carotid arteries. Patent anterior communicating artery. Patent anterior and middle cerebral arteries.  Mild stenosis LEFT M2 origin. No large vessel occlusion, flow limiting stenosis, or aneurysm. POSTERIOR CIRCULATION: Codominant vertebral artery is dominant. Vertebrobasilar arteries are patent, with normal flow related enhancement of the main branch vessels. Patent posterior cerebral arteries. Small LEFT posterior communicating artery present. Fetal origin RIGHT posterior cerebral artery. No large vessel occlusion, flow limiting stenosis, or aneurysm. ANATOMIC VARIANTS: None. Source data and MIP images were reviewed. IMPRESSION: MRI HEAD: 1. Acute versus subacute faint RIGHT thalamus nonhemorrhagic infarct. 2. Moderate to severe chronic small vessel ischemic changes. MRA HEAD: 1. No emergent large vessel occlusion or flow-limiting stenosis. Complete circle-of-Willis. Electronically Signed   By: Elon Alas M.D.   On: 11/07/2018 19:24    EKG:   Normal sinus rhythm no ST elevation or depression first-degree AV block  IMPRESSION AND PLAN:   84 year old female with a history of stress-induced cardiomyopathy in 2013 teen, breast cancer, chronic kidney disease stage III and essential hypertension who developed focal numbness and weakness in the left arm and leg on Friday which had somewhat resolved and presents today for evaluation.  1.  Acute versus subacute faint RIGHT thalamus nonhemorrhagic infarct. Continue aspirin and add Plavix Start statin Check lipid panel, A1c Hold hypertensive medications to allow perfusion Neurology consultation requested and ED physician already spoke with neurology PT, OT  2.  History of breast cancer: Continue Femara  3.  Essential hypertension: Hold blood pressure medications to allow brain perfusion due to #1  4.  Depression/anxiety: Continue Zoloft and Remeron  5.  Mild hyponatremia: Hold metolazone for now  6.  Chronic systolic heart failure: No signs of exacerbation  7.  Chronic kidney disease stage III with stable creatinine   All the records are  reviewed and case discussed with ED provider. Management plans discussed with the patient and daughter and they are in agreement  CODE STATUS: DNR  TOTAL TIME TAKING CARE OF THIS PATIENT: 45 minutes.    Ace Bergfeld M.D on 11/07/2018 at 7:31 PM  Between 7am to 6pm - Pager - [phone]  After 6pm go to [email or web address] - Proofreader  [facility]  Office  [phone]  CC: Primary care physician; Derrel Nip,  Aris Everts, MD

## 2018-11-07 NOTE — ED Provider Notes (Addendum)
[facility] Emergency Department Provider Note  ____________________________________________  Time seen: Approximately 5:16 PM  I have reviewed the triage vital signs and the nursing notes.   HISTORY  Chief Complaint Weakness and Hypertension    HPI Kim Santiago is a 84 y.o. female the history of HTN, HL, CAD status post MI, Sjogren's syndrome, remote breast cancer, presenting with left leg weakness, and left arm and leg numbness.  The patient reports that on Friday, she was unable to get out of her car because of leg weakness on the left.  She also noted left lower extremity numbness and left hand numbness.  No headache, visual changes, speech changes, mental status changes.  No chest pain, shortness of breath or palpitations.  She ambulated with the assistance of her daughter, but would not have been able to ambulate on her own.  On Saturday, she noted that she was significantly hypertensive with systolics in the 767M, and continued to have left leg weakness.  She went to urgent care, and I have reviewed the chart; at that time she presented for her hypertension.  Over the last 3 days, the patient symptoms have significantly improved and now she is ambulating on her own although she does not feel completely back to baseline.  She called her PMD for a follow-up appointment and they referred her to the emergency department.  No back pain or trauma, saddle anesthesia, fecal or urinary incontinence.   Past Medical History:  Diagnosis Date  . Arthritis   . Breast cancer ([place]) 2017   left mastectomy done 11/2015  . Breast cancer in female [facility]) 11/18/2015   Left: 3.9 cm tumor, T2, 1/2 sentinel nodes positive for macro metastatic disease, N1, 3 negative nodes in the axillary tail, ER+,PR+, Her 2 neu, low Mammoprint score  . Cancer [facility])    thyroid takes levothyroxine  . Chronic kidney disease    UTI  . Genetic screening March 2017.   Mammoprint of left breast  cancer: Low risk for recurrence.   . Hypertension   . hypothyroidism    secondary to thyroidectomy for thyroid ca  . Hypothyroidism   . Lupus ([facility])    subcutaneous  . Menopause 40s   natural, hot flashes and mood lability now gone, off prempro 7 months  . Myocardial infarction ([place]) 2013  . Osteoporosis    Osteopenia  . Rosacea   . Sjoegren syndrome   . Stress-induced cardiomyopathy September of 2013   EF 35%. Peak troponin was 1.8.    Patient Active Problem List   Diagnosis Date Noted  . Anxiety about health 07/05/2018  . Mediastinal adenopathy 04/27/2018  . Edema due to hypoalbuminemia 02/20/2018  . Hospital discharge follow-up 02/20/2018  . History of vertebral fracture 02/18/2018  . GERD (gastroesophageal reflux disease) 10/31/2017  . Hand joint pain 06/13/2017  . Vaginal atrophy 05/11/2017  . Venous insufficiency of both lower extremities 01/10/2017  . Chest pain of uncertain etiology 09/47/0962  . Discoid lupus 06/26/2016  . Recurrent UTI (urinary tract infection) 04/29/2016  . TMJ syndrome 03/31/2016  . Hearing loss 03/12/2016  . Osteoporosis of lumbar spine 12/05/2015  . Malignant neoplasm of left female breast ([facility]) 11/26/2015  . Breast cancer, female, left 11/05/2015  . Do not resuscitate discussion 09/18/2015  . History of heart attack 06/12/2014  . Hx of thyroid cancer 06/12/2014  . Adenomatous polyp 06/12/2014  . Major depressive disorder, recurrent episode, moderate ([place]) 05/29/2014  . Grief 04/08/2014  . Sjogren's syndrome (  [place]) 03/09/2014  . Insomnia due to stress 03/06/2014  . Cervicalgia of occipito-atlanto-axial region 09/22/2013  . Hyperlipidemia 12/28/2012  . Medicare annual wellness visit, subsequent 12/28/2012  . Essential hypertension 10/02/2011  . Iatrogenic hypothyroidism 10/02/2011  . Purpura ([place]) 05/25/2011    Past Surgical History:  Procedure Laterality Date  . BACK SURGERY    . BREAST BIOPSY Left 10/30/15   positive, done in Dr.  Dwyane Luo office  . CARDIAC CATHETERIZATION  05/2012   [facility]. No significant CAD. Ejection fraction of 35% due to stress-induced cardiomyopathy.  . CHOLECYSTECTOMY    . COLONOSCOPY    . DILATION AND CURETTAGE OF UTERUS    . KYPHOSIS SURGERY  Feb 2008   L1, Dr. Mauri Pole  . LUMBAR DISC SURGERY     L4-L5  . MASTECTOMY Left 11/18/2015   positive  . SENTINEL NODE BIOPSY Left 11/18/2015   Procedure: SENTINEL NODE BIOPSY;  Surgeon: Robert Bellow, MD;  Location: [facility] ORS;  Service: General;  Laterality: Left;  . SHOULDER ARTHROSCOPY  2004   Left, Dr. Jefm Bryant  . SIMPLE MASTECTOMY WITH AXILLARY SENTINEL NODE BIOPSY Left 11/18/2015   Procedure: SIMPLE MASTECTOMY;  Surgeon: Robert Bellow, MD;  Location: [facility] ORS;  Service: General;  Laterality: Left;  . SPINE SURGERY     L4-5 diskectomy  . THYROIDECTOMY     Thyroid Cancer  . TONSILLECTOMY    . TUBAL LIGATION      Current Outpatient Rx  . Order #: [id number] Class: Historical Med  . Order #: [id number] Class: Historical Med  . Order #: [id number] Class: Historical Med  . Order #: [id number] Class: Normal  . Order #: [id number] Class: Historical Med  . Order #: [id number] Class: Normal  . Order #: [id number] Class: Normal  . Order #: [id number] Class: Normal  . Order #: [id number] Class: Normal  . Order #: [id number] Class: Normal  . Order #: [id number] Class: Normal  . Order #: [id number] Class: Normal  . Order #: [id number] Class: Normal  . Order #: [id number] Class: Normal  . Order #: [id number] Class: Print  . Order #: [id number] Class: Historical Med  . Order #: [id number] Class: Historical Med  . Order #: [id number] Class: Normal  . Order #: [id number] Class: Normal  . Order #: [id number] Class: Print  . Order #: [id number] Class: Historical Med    Allergies Azathioprine; Hydroxychloroquine; Mycophenolate mofetil; Amoxicillin; Codeine; Naprosyn [naproxen]; Orudis [ketoprofen]; and Sulfathiazole  Family History  Problem Relation Age of Onset  . COPD Father   .  Cancer Father        esophageal  . Kidney disease Mother   . Heart disease Mother   . Kidney disease Sister   . Cancer Brother 19       colon cancer (both brothers)  . Heart attack Brother 7  . Heart disease Brother   . Breast cancer Neg Hx     Social History Social History   Tobacco Use  . Smoking status: Never Smoker  . Smokeless tobacco: Never Used  Substance Use Topics  . Alcohol use: No  . Drug use: No    Review of Systems Constitutional: No fever/chills.  No lightheadedness or syncope. Eyes: No visual changes.  No blurred or double vision. ENT: No sore throat. No congestion or rhinorrhea. Cardiovascular: Denies chest pain. Denies palpitations. Respiratory: Denies shortness of breath.  No cough. Gastrointestinal: No abdominal pain.  No nausea, no vomiting.  No diarrhea.  No constipation. Genitourinary: Negative for dysuria. Musculoskeletal: Negative for back pain. Skin: Negative for rash. Neurological: Negative for headaches.  Positive for left hand numbness,  left leg numbness, and left leg weakness.  Positive difficulty walking due to left leg weakness.  No changes in vision, speech or mental status.   ____________________________________________   PHYSICAL EXAM:  VITAL SIGNS: ED Triage Vitals  Enc Vitals Group     BP 11/07/18 1113 (!) 162/64     Pulse Rate 11/07/18 1113 78     Resp 11/07/18 1113 20     Temp 11/07/18 1113 98.8 F (37.1 C)     Temp Source 11/07/18 1113 Oral     SpO2 11/07/18 1113 98 %     Weight 11/07/18 1114 133 lb (60.3 kg)     Height 11/07/18 1114 5\' 4"  (1.626 m)     Head Circumference --      Peak Flow --      Pain Score 11/07/18 1113 0     Pain Loc --      Pain Edu? --      Excl. in [place]? --     Constitutional: Alert and oriented. Answers questions appropriately.  GCS is 15. Eyes: Conjunctivae are normal.  EOMI. PERRLA.  No scleral icterus. Head: Atraumatic. Nose: No congestion/rhinnorhea. Mouth/Throat: Mucous membranes are  moist.  Neck: No stridor.  Supple.  No JVD.  No meningismus. Cardiovascular: Normal rate, regular rhythm. No murmurs, rubs or gallops.  Respiratory: Normal respiratory effort.  No accessory muscle use or retractions. Lungs CTAB.  No wheezes, rales or ronchi. Gastrointestinal: Soft, nontender and nondistended.  No guarding or rebound.  No peritoneal signs. Musculoskeletal: No LE edema. No ttp in the calves or palpable cords.  Negative Homan's sign. Neurologic: Alert and oriented 3. Speech is clear.  The patient has some mild facial asymmetry with possible decreased nasolabial fold on the left and asymmetric smile with small smile but with a large smile she has symmetry.  Her daughter states that her face is unchanged compared to her baseline.. Tongue is midline.  EOMI.  PERRLA.  No vertical or horizontal nystagmus.  No pronator drift. 5 out of 5 grip, biceps, triceps, hip flexors, plantar flexion and dorsiflexion. Normal sensation to light touch in the bilateral upper extremities, and face.  Decreased sensation to light touch diffusely in the left lower extremity.  Normal heel-to-shin.  Normal finger-nose-finger. Skin:  Skin is warm, dry and intact. No rash noted. Psychiatric: Mood and affect are normal. Speech and behavior are normal.  Normal judgement.  ____________________________________________   LABS (all labs ordered are listed, but only abnormal results are displayed)  Labs Reviewed  CBC - Abnormal; Notable for the following components:      Result Value   WBC 3.4 (*)    RBC 3.37 (*)    Hemoglobin 11.1 (*)    HCT 32.4 (*)    Platelets 141 (*)    All other components within normal limits  URINALYSIS, COMPLETE (UACMP) WITH MICROSCOPIC - Abnormal; Notable for the following components:   Color, Urine STRAW (*)    APPearance CLEAR (*)    All other components within normal limits  COMPREHENSIVE METABOLIC PANEL - Abnormal; Notable for the following components:   Sodium 132 (*)     Glucose, Bld 102 (*)    Calcium 8.6 (*)    All other components within normal limits  CBC WITH DIFFERENTIAL/PLATELET - Abnormal; Notable for the following components:   WBC 3.3 (*)    RBC 3.39 (*)    Hemoglobin 11.2 (*)    HCT 32.9 (*)    Abs Immature Granulocytes 0.09 (*)  All other components within normal limits  PROTIME-INR  APTT  CBG MONITORING, ED   ____________________________________________  EKG  ED ECG REPORT I, Anne-Caroline Mariea Clonts, the attending physician, personally viewed and interpreted this ECG.   Date: 11/07/2018  EKG Time: 1121  Rate: 73  Rhythm: normal sinus rhythm  Axis: normal  Intervals:first-degree A-V block   ST&T Change: No STEMI; nonspecific T wave inversions in V1 and V2.  ____________________________________________  RADIOLOGY  Ct Head Wo Contrast  Result Date: 11/07/2018 CLINICAL DATA:  84 year old with possible stroke. EXAM: CT HEAD WITHOUT CONTRAST TECHNIQUE: Contiguous axial images were obtained from the base of the skull through the vertex without intravenous contrast. COMPARISON:  02/09/2018 FINDINGS: Brain: No evidence for acute hemorrhage, mass lesion, midline shift, hydrocephalus or large infarct. Again noted is low-density in the periventricular and subcortical white matter suggesting chronic changes. Vascular: No hyperdense vessel or unexpected calcification. Skull: Normal. Negative for fracture or focal lesion. Sinuses/Orbits: No acute finding. Other: None. IMPRESSION: 1. No acute intracranial abnormality. 2. Stable areas of low-density in the white matter. Findings are most compatible with chronic small vessel ischemic disease. Electronically Signed   By: Markus Daft M.D.   On: 11/07/2018 11:51    ____________________________________________   PROCEDURES  Procedure(s) performed: None  Procedures  Critical Care performed: Yes, see critical care note(s) ____________________________________________   INITIAL IMPRESSION /  ASSESSMENT AND PLAN / ED COURSE  Pertinent labs & imaging results that were available during my care of the patient were reviewed by me and considered in my medical decision making (see chart for details).  84 y.o. female with a history of HTN, HL, CAD presenting with left lower extremity weakness and numbness, left hand numbness, and hypertension.  Overall, the patient is hypertensive here today at 173/89 and states she has taken her medications including her low-dose aspirin today.  I am concerned about CVA versus TIA.  Given that she has ongoing symptoms, we will plan full stroke evaluation.  Her CT scan does not show acute any acute intracranial process, and I have not ordered an MRI of of the brain.  I have also requested a tele-psychiatry consultation.  The patient does not have a UTI, her anemia is stable, and she has no significant electrolyte abnormalities which would be causing the symptoms.  He does have hyponatremia with a sodium of 132 and is receiving fluids.  CRITICAL CARE Performed by: Eula Listen   Total critical care time: 35 minutes  Critical care time was exclusive of separately billable procedures and treating other patients.  Critical care was necessary to treat or prevent imminent or life-threatening deterioration.  Critical care was time spent personally by me on the following activities: development of treatment plan with patient and/or surrogate as well as nursing, discussions with consultants, evaluation of patient's response to treatment, examination of patient, obtaining history from patient or surrogate, ordering and performing treatments and interventions, ordering and review of laboratory studies, ordering and review of radiographic studies, pulse oximetry and re-evaluation of patient's condition.   ____________________________________________  FINAL CLINICAL IMPRESSION(S) / ED DIAGNOSES  Final diagnoses:  Cerebrovascular accident (CVA), unspecified  mechanism ([place])  Hyponatremia  Hypertension, unspecified type         NEW MEDICATIONS STARTED DURING THIS VISIT:  New Prescriptions   No medications on file      Eula Listen, MD 11/07/18 1723    Eula Listen, MD 11/07/18 1725    Eula Listen, MD 11/07/18 [phone]

## 2018-11-07 NOTE — ED Notes (Signed)
Got report from [employer]

## 2018-11-07 NOTE — Telephone Encounter (Signed)
Patient is going to ED.

## 2018-11-08 ENCOUNTER — Inpatient Hospital Stay: Payer: PPO

## 2018-11-08 ENCOUNTER — Inpatient Hospital Stay (HOSPITAL_COMMUNITY)
Admit: 2018-11-08 | Discharge: 2018-11-08 | Disposition: A | Discharge status: Home / Self Care | Payer: PPO | Attending: Internal Medicine | Admitting: Internal Medicine

## 2018-11-08 DIAGNOSIS — R531 Weakness: Secondary | ICD-10-CM

## 2018-11-08 DIAGNOSIS — I371 Nonrheumatic pulmonary valve insufficiency: Secondary | ICD-10-CM

## 2018-11-08 DIAGNOSIS — I361 Nonrheumatic tricuspid (valve) insufficiency: Secondary | ICD-10-CM

## 2018-11-08 LAB — LIPID PANEL
Cholesterol: 137 mg/dL (ref 0–200)
HDL: 30 mg/dL — ABNORMAL LOW (ref >40)
LDL Cholesterol: 90 mg/dL (ref 0–99)
Total CHOL/HDL Ratio: 4.6 RATIO
Triglycerides: 85 mg/dL (ref <150)
VLDL: 17 mg/dL (ref 0–40)

## 2018-11-08 LAB — HEMOGLOBIN A1C
Hgb A1c MFr Bld: 4.9 % (ref 4.8–5.6)
Mean Plasma Glucose: 93.93 mg/dL

## 2018-11-08 LAB — ECHOCARDIOGRAM COMPLETE
Height: 64 in
Weight: 2084.8 oz

## 2018-11-08 MED ORDER — ATORVASTATIN CALCIUM 20 MG PO TABS: 20.0000 mg | ORAL_TABLET | Freq: Every day | ORAL | Status: DC

## 2018-11-08 MED ORDER — ATORVASTATIN CALCIUM 20 MG PO TABS: 20.0000 mg | ORAL_TABLET | Freq: Every day | ORAL | 0 refills | Status: DC

## 2018-11-08 MED ORDER — ASPIRIN 81 MG PO TBEC: 81.0000 mg | DELAYED_RELEASE_TABLET | Freq: Every day | ORAL | 0 refills | Status: DC

## 2018-11-08 NOTE — Progress Notes (Signed)
PT Cancellation Note  Patient Details Name: Kim Santiago MRN: [id number] DOB: [birth year]   Cancelled Treatment:    Reason Eval/Treat Not Completed: (Consult received and chart reviewed.  Patient currently eating breakfast and with SLP; will re-attempt at later time/date as medically appropriate and available.)   Neale Marzette H. Owens Shark, PT, DPT, NCS 11/08/18, 8:58 AM [phone]

## 2018-11-08 NOTE — Evaluation (Signed)
Occupational Therapy Evaluation Patient Details Name: Kim Santiago MRN: [id number] DOB: 12/08/1934 Today's Date: 11/08/2018    History of Present Illness presented to ER secondary to L UE/LE weakness; admitted for TIA/CVA work up.  MRI significant for R thalamic infarct.   Clinical Impression   Pt is 84 year old female who presents with new onset of R thalamic infarct with LUE and LE weakness.  She was independent in ADLs prior to admission and was in process of moving in with her daughter to a new home which she will have the downstairs bedroom with bathroom with walk in shower.  Rec shower chair with back with rail if possible and remove all throw rugs.  She required CGA for ambulating to bathroom and able to complete hygiene and needed CGA for balance.  She has good AROM in LUE and hand with slight decreased in light touch and impaired fine motor skills for buttons, turning to open toothpaste, etc  She is motivated to regain full use of LUE and hand and would benefit from outpatient OT services.  Pt given theraputty with HEP to work on strengthening and fine motor coordination skills exercise sheet.         Follow Up Recommendations  Outpatient OT    Equipment Recommendations  Tub/shower seat    Recommendations for Other Services       Precautions / Restrictions Precautions Precautions: Fall Restrictions Weight Bearing Restrictions: No      Mobility Bed Mobility Overal bed mobility: Modified Independent                Transfers Overall transfer level: Needs assistance Equipment used: None Transfers: Sit to/from Stand Sit to Stand: Min guard         General transfer comment: UE support for optimal stabilization    Balance Overall balance assessment: Needs assistance Sitting-balance support: No upper extremity supported;Feet supported Sitting balance-Leahy Scale: Good     Standing balance support: No upper extremity supported Standing balance-Leahy  Scale: Fair                             ADL either performed or assessed with clinical judgement   ADL Overall ADL's : Modified independent                                       General ADL Comments: Pt is able to ambulate to bathroom with CGA for balance and able to complete hygiene with supervision and LB dressing at chair level with out any assist and CGA for standing for pants over hips.  She is able to use LUE and hand functionally but has decreased fine motor and rapid alternating movement controll  Strength is 4+/5 on R and 4-/5 on L .     Vision Baseline Vision/History: Wears glasses Wears Glasses: At all times Patient Visual Report: No change from baseline       Perception     Praxis      Pertinent Vitals/Pain Pain Assessment: No/denies pain     Hand Dominance Right   Extremity/Trunk Assessment Upper Extremity Assessment Upper Extremity Assessment: LUE deficits/detail LUE Deficits / Details: Pt with full AROM on LUE and hand but has decreased fine motor control in L hand with slight numbness.  Pt to be given theraputty with HEP today before she leaves to go home. LUE  Sensation: decreased light touch LUE Coordination: decreased fine motor   Lower Extremity Assessment Lower Extremity Assessment: Defer to PT evaluation       Communication Communication Communication: No difficulties   Cognition Arousal/Alertness: Awake/alert Behavior During Therapy: WFL for tasks assessed/performed Overall Cognitive Status: Within Functional Limits for tasks assessed                                     General Comments       Exercises Other Exercises Other Exercises: Toilet transfer, ambulatory with RW, cga/close sup; sit/stand, basic transfers and standing balance (for hygiene, clothing management, hand hygiene), cga/close sup Other Exercises: 200' with loftstrand, close sup-improved symmetry, mechanics and overall gait fluidity;  able to complete head turns, changes of direction without LOB.  do recommend continued use of loftstrand with all gait efforts; patient voiced agreement/understanding.   Shoulder Instructions      Home Living Family/patient expects to be discharged to:: Private residence Living Arrangements: Children Available Help at Discharge: Family;Available PRN/intermittently Type of Home: House Home Access: Stairs to enter [employer] of Steps: 3 Entrance Stairs-Rails: Left Home Layout: Two level;Able to live on main level with bedroom/bathroom     Bathroom Shower/Tub: Walk-in shower;Curtain   Bathroom Toilet: Handicapped height(has BSC over toilet) Bathroom Accessibility: Yes How Accessible: Accessible via walker Home Equipment: Bedside commode;Grab bars - tub/shower   Additional Comments: In transition to a new home for patient to live with her daughter and family and she will be on first floor with walk in shower with grab bar and rec shower chair with back with a rail if possible.       Prior Functioning/Environment Level of Independence: Independent        Comments: Indep with ADLs, household and community mobilization; denies fall history; + driving        OT Problem List: Decreased strength;Decreased activity tolerance;Decreased coordination;Impaired UE functional use;Impaired balance (sitting and/or standing)      OT Treatment/Interventions: Self-care/ADL training;Therapeutic activities;Balance training;Patient/family education;Neuromuscular education    OT Goals(Current goals can be found in the care plan section) Acute Rehab OT Goals Patient Stated Goal: to return home OT Goal Formulation: With patient/family Time For Goal Achievement: 11/22/18 Potential to Achieve Goals: Good ADL Goals Pt Will Perform Lower Body Dressing: with set-up;with supervision;sit to/from stand Pt Will Transfer to Toilet: with set-up;with supervision;stand pivot  transfer;ambulating Pt/caregiver will Perform Home Exercise Program: Increased strength;Left upper extremity;With written HEP provided  OT Frequency: Min 2X/week   Barriers to D/C:            Co-evaluation              AM-PAC OT "6 Clicks" Daily Activity     Outcome Measure Help from another person eating meals?: A Little Help from another person taking care of personal grooming?: None Help from another person toileting, which includes using toliet, bedpan, or urinal?: None Help from another person bathing (including washing, rinsing, drying)?: A Little Help from another person to put on and taking off regular upper body clothing?: A Little Help from another person to put on and taking off regular lower body clothing?: None 6 Click Score: 21   End of Session Nurse Communication: Other (comment)  Activity Tolerance: Patient tolerated treatment well Patient left: in chair;with call bell/phone within reach;with family/visitor present(pt's daughter present and chair alarm requested to be off by daughter and will notify  NSG of this)  OT Visit Diagnosis: Muscle weakness (generalized) (M62.81);Hemiplegia and hemiparesis Hemiplegia - Right/Left: Left Hemiplegia - dominant/non-dominant: Non-Dominant Hemiplegia - caused by: Cerebral infarction                Time: 1045-1130 OT Time Calculation (min): 45 min Charges:  OT General Charges $OT Visit: 1 Visit OT Evaluation $OT Eval Low Complexity: 1 Low OT Treatments $Self Care/Home Management : 8-22 mins $Neuromuscular Re-education: 8-22 mins  Chrys Racer, OTR/L ascom [phone] 11/08/18, 11:45 AM

## 2018-11-08 NOTE — Discharge Summary (Signed)
[facility] NAME: Kim Santiago    MR#:  [id number]  DATE OF BIRTH:  Aug 19, 1935  DATE OF ADMISSION:  11/07/2018 ADMITTING PHYSICIAN: Bettey Costa, MD  DATE OF DISCHARGE: No discharge date for patient encounter.  PRIMARY CARE PHYSICIAN: [name], MD    ADMISSION DIAGNOSIS:  Hyponatremia [E87.1] Stroke (cerebrum) ([place]) [I63.9] Cerebrovascular accident (CVA), unspecified mechanism ([place]) [I63.9] Hypertension, unspecified type [I10]  DISCHARGE DIAGNOSIS:  Active Problems:   CVA (cerebral vascular accident) ([place])   SECONDARY DIAGNOSIS:   Past Medical History:  Diagnosis Date  . Arthritis   . Breast cancer ([place]) 2017   left mastectomy done 11/2015  . Breast cancer in female [facility]) 11/18/2015   Left: 3.9 cm tumor, T2, 1/2 sentinel nodes positive for macro metastatic disease, N1, 3 negative nodes in the axillary tail, ER+,PR+, Her 2 neu, low Mammoprint score  . Cancer [facility])    thyroid takes levothyroxine  . Chronic kidney disease    UTI  . Genetic screening March 2017.   Mammoprint of left breast cancer: Low risk for recurrence.   . Hypertension   . hypothyroidism    secondary to thyroidectomy for thyroid ca  . Hypothyroidism   . Lupus ([facility])    subcutaneous  . Menopause 40s   natural, hot flashes and mood lability now gone, off prempro 7 months  . Myocardial infarction ([place]) 2013  . Osteoporosis    Osteopenia  . Rosacea   . Sjoegren syndrome   . Stress-induced cardiomyopathy September of 2013   EF 35%. Peak troponin was 1.8.    HOSPITAL COURSE:  84 year old female with a history of stress-induced cardiomyopathy in 2013 teen, breast cancer, chronic kidney disease stage III and essential hypertension who developed focal numbness and weakness in the left arm and leg on Friday which had somewhat resolved and presents today for evaluation.  *Acute versus subacute faint RIGHT thalamus nonhemorrhagic  infarct Stable Admitted to regular nursing for bed on our CVA protocol, case discussed with neurology/Dr. Irish Elders with nurse practitioner-treat with continued aspirin/statin therapy given probable hypertensive stroke, okay for discharge, will have patient follow-up status post discharge for reevaluation, no needs per speech therapy, for outpatient PT/OT with forearm crutch for mobility  *History of breast cancer Stable continue Femara  *Essential hypertension Continue current regiment  *Chronic depression/anxiety Stable Continue Zoloft and Remeron  * Mild hyponatremia Metolazone held while in house, restarted at discharge  *Chronic systolic heart failure Stable on home regiment which will be continued at discharge  no signs of exacerbation  *Chronic kidney disease stage III  stable creatinine   DISCHARGE CONDITIONS:   stable  CONSULTS OBTAINED:  Treatment Team:  Catarina Hartshorn, MD  DRUG ALLERGIES:   Allergies  Allergen Reactions  . Azathioprine Nausea And Vomiting    Severe vomiting  . Hydroxychloroquine Hives and Nausea And Vomiting  . Mycophenolate Mofetil Nausea Only  . Amoxicillin Rash    Has patient had a PCN reaction causing immediate rash, facial/tongue/throat swelling, SOB or lightheadedness with hypotension: Unknown Has patient had a PCN reaction causing severe rash involving mucus membranes or skin necrosis: Unknown Has patient had a PCN reaction that required hospitalization: Unknown Has patient had a PCN reaction occurring within the last 10 years: Unknown If all of the above answers are "NO", then may proceed with Cephalosporin use. Tolerates ceftriaxone and Ancef  . Codeine Nausea And Vomiting  . Naprosyn [Naproxen] Swelling  . Orudis [Ketoprofen]  Hives  . Sulfathiazole Rash    DISCHARGE MEDICATIONS:   Allergies as of 11/08/2018      Reactions   Azathioprine Nausea And Vomiting   Severe vomiting   Hydroxychloroquine Hives, Nausea And  Vomiting   Mycophenolate Mofetil Nausea Only   Amoxicillin Rash   Has patient had a PCN reaction causing immediate rash, facial/tongue/throat swelling, SOB or lightheadedness with hypotension: Unknown Has patient had a PCN reaction causing severe rash involving mucus membranes or skin necrosis: Unknown Has patient had a PCN reaction that required hospitalization: Unknown Has patient had a PCN reaction occurring within the last 10 years: Unknown If all of the above answers are "NO", then may proceed with Cephalosporin use. Tolerates ceftriaxone and Ancef   Codeine Nausea And Vomiting   Naprosyn [naproxen] Swelling   Orudis [ketoprofen] Hives   Sulfathiazole Rash      Medication List    STOP taking these medications   aspirin 81 MG tablet Replaced by:  aspirin 81 MG EC tablet     TAKE these medications   aspirin 81 MG EC tablet Take 1 tablet (81 mg total) by mouth daily. Start taking on:  November 09, 2018 Replaces:  aspirin 81 MG tablet   atorvastatin 20 MG tablet Commonly known as:  LIPITOR Take 1 tablet (20 mg total) by mouth daily at 6 PM.   Calcium Carbonate-Vitamin D 600-200 MG-UNIT Tabs Take 2 tablets by mouth 2 (two) times daily.   cholecalciferol 1000 units tablet Commonly known as:  VITAMIN D Take 2,000 Units by mouth daily.   ciprofloxacin 500 MG tablet Commonly known as:  CIPRO Take 1 tablet (500 mg total) by mouth 2 (two) times daily.   CRANBERRY PO Take 1 capsule by mouth 2 (two) times daily.   DENTA 5000 PLUS 1.1 % Crea dental cream Generic drug:  sodium fluoride Take 1 application by mouth daily.   furosemide 20 MG tablet Commonly known as:  LASIX 1 tablet every other day as needed   letrozole 2.5 MG tablet Commonly known as:  FEMARA Take 1 tablet (2.5 mg total) by mouth daily.   levothyroxine 75 MCG tablet Commonly known as:  SYNTHROID, LEVOTHROID Take 1 tablet by mouth (12mcg total) daily   losartan 25 MG tablet Commonly known as:   COZAAR Take 2 tablets (50 mg total) by mouth daily.   metolazone 2.5 MG tablet Commonly known as:  ZAROXOLYN TAKE 1 TABLET (2.5 MG TOTAL) BY MOUTH DAILY. 30 MINUTES BEFORE FUROSEMIDE DOSE   metoprolol succinate 25 MG 24 hr tablet Commonly known as:  TOPROL-XL Take 1 tablet by mouth daily   mirtazapine 7.5 MG tablet Commonly known as:  REMERON Take 1 tablet (7.5 mg total) by mouth at bedtime.   nitroGLYCERIN 0.4 MG SL tablet Commonly known as:  NITROSTAT Place 1 tablet (0.4 mg total) under the tongue every 5 (five) minutes as needed for chest pain. Maximum 3 doses   omeprazole 20 MG capsule Commonly known as:  PRILOSEC Take 1 capsule by mouth twice a day before a meal   ondansetron 4 MG disintegrating tablet Commonly known as:  ZOFRAN ODT Take 1 tablet (4 mg total) by mouth every 8 (eight) hours as needed for nausea or vomiting.   polyethylene glycol packet Commonly known as:  MIRALAX / GLYCOLAX Take 17 g by mouth daily.   PROBIOTIC PO Take 1 tablet by mouth daily.   sertraline 50 MG tablet Commonly known as:  ZOLOFT TAKE 1 TABLET BY MOUTH  EVERY DAY   tizanidine 2 MG capsule Commonly known as:  ZANAFLEX Take 1 capsule (2 mg total) by mouth 3 (three) times daily.   traMADol 50 MG tablet Commonly known as:  ULTRAM Take 1 tablet (50 mg total) by mouth every 8 (eight) hours as needed.   triamcinolone ointment 0.1 % Commonly known as:  KENALOG Apply 1 application topically daily.            Durable Medical Equipment  (From admission, onward)         Start     Ordered   11/08/18 0000  DME Crutches    Comments:  Forearm crutches regarding acute right thalamic stroke, left leg weakness   11/08/18 1235           DISCHARGE INSTRUCTIONS:  If you experience worsening of your admission symptoms, develop shortness of breath, life threatening emergency, suicidal or homicidal thoughts you must seek medical attention immediately by calling 911 or calling your MD  immediately  if symptoms less severe.  You Must read complete instructions/literature along with all the possible adverse reactions/side effects for all the Medicines you take and that have been prescribed to you. Take any new Medicines after you have completely understood and accept all the possible adverse reactions/side effects.   Please note  You were cared for by a hospitalist during your hospital stay. If you have any questions about your discharge medications or the care you received while you were in the hospital after you are discharged, you can call the unit and asked to speak with the hospitalist on call if the hospitalist that took care of you is not available. Once you are discharged, your primary care physician will handle any further medical issues. Please note that NO REFILLS for any discharge medications will be authorized once you are discharged, as it is imperative that you return to your primary care physician (or establish a relationship with a primary care physician if you do not have one) for your aftercare needs so that they can reassess your need for medications and monitor your lab values.    Today   CHIEF COMPLAINT:   Chief Complaint  Patient presents with  . Weakness  . Hypertension    HISTORY OF PRESENT ILLNESS:  84 y.o. female with a known history of breast cancer, hypertension and chronic kidney disease stage III who presents to the emergency room due to above issue.  Patient reports that on Friday approximately 9:30 AM she developed sudden onset of left leg and arm weakness and numbness without other focal deficits.  She was having difficulty ambulating but apparently now can ambulate.  Her symptoms largely resolved on Friday.  She was told by her primary care physician's office to go to urgent care on Saturday due to her symptoms.  Urgent care told her to follow-up with her PCP on Monday however today her PCP told her to go immediately to the emergency room to be  evaluated for her symptoms that occurred on Friday.  She does report that she continues to have some on and off numbness of the left leg and arm however much improved since Friday.  Again she denies any other focal deficits.  She was evaluated by telemetry neuro while in the ER.  MRI does confirm stroke. VITAL SIGNS:  Blood pressure 124/66, pulse 70, temperature (!) 97.3 F (36.3 C), temperature source Oral, resp. rate 18, height 5\' 4"  (1.626 m), weight 59.1 kg, SpO2 96 %.  I/O:  No intake or output data in the 24 hours ending 11/08/18 1236  PHYSICAL EXAMINATION:  GENERAL:  84 y.o.-year-old patient lying in the bed with no acute distress.  EYES: Pupils equal, round, reactive to light and accommodation. No scleral icterus. Extraocular muscles intact.  HEENT: Head atraumatic, normocephalic. Oropharynx and nasopharynx clear.  NECK:  Supple, no jugular venous distention. No thyroid enlargement, no tenderness.  LUNGS: Normal breath sounds bilaterally, no wheezing, rales,rhonchi or crepitation. No use of accessory muscles of respiration.  CARDIOVASCULAR: S1, S2 normal. No murmurs, rubs, or gallops.  ABDOMEN: Soft, non-tender, non-distended. Bowel sounds present. No organomegaly or mass.  EXTREMITIES: No pedal edema, cyanosis, or clubbing.  NEUROLOGIC: Cranial nerves II through XII are intact. Muscle strength 5/5 in all extremities. Sensation intact. Gait not checked.  PSYCHIATRIC: The patient is alert and oriented x 3.  SKIN: No obvious rash, lesion, or ulcer.   DATA REVIEW:   CBC Recent Labs  Lab 11/07/18 1127  WBC 3.3*  3.4*  HGB 11.2*  11.1*  HCT 32.9*  32.4*  PLT 152  141*    Chemistries  Recent Labs  Lab 11/07/18 1127  NA 132*  K 4.9  CL 99  CO2 26  GLUCOSE 102*  BUN 12  CREATININE 0.54  CALCIUM 8.6*  AST 25  ALT 10  ALKPHOS 70  BILITOT 0.6    Cardiac Enzymes No results for input(s): TROPONINI in the last 168 hours.  Microbiology Results  Results for  orders placed or performed during the hospital encounter of 09/23/18  Urine Culture     Status: Abnormal   Collection Time: 09/23/18  2:59 PM  Result Value Ref Range Status   Specimen Description   Final    URINE, CLEAN CATCH Performed at [facility], [address] [place], [place] [address]    Special Requests   Final    NONE Performed at [facility], [address] [place], [place] [address]    Culture 60,000 COLONIES/mL SERRATIA MARCESCENS (A)  Final   Report Status 09/26/2018 FINAL  Final   Organism ID, Bacteria SERRATIA MARCESCENS (A)  Final      Susceptibility   Serratia marcescens - MIC*    CEFAZOLIN >=64 RESISTANT Resistant     CEFTRIAXONE <=1 SENSITIVE Sensitive     CIPROFLOXACIN <=0.25 SENSITIVE Sensitive     GENTAMICIN <=1 SENSITIVE Sensitive     NITROFURANTOIN 256 RESISTANT Resistant     TRIMETH/SULFA <=20 SENSITIVE Sensitive     * 60,000 COLONIES/mL SERRATIA MARCESCENS    RADIOLOGY:  Ct Head Wo Contrast  Result Date: 11/07/2018 CLINICAL DATA:  84 year old with possible stroke. EXAM: CT HEAD WITHOUT CONTRAST TECHNIQUE: Contiguous axial images were obtained from the base of the skull through the vertex without intravenous contrast. COMPARISON:  02/09/2018 FINDINGS: Brain: No evidence for acute hemorrhage, mass lesion, midline shift, hydrocephalus or large infarct. Again noted is low-density in the periventricular and subcortical white matter suggesting chronic changes. Vascular: No hyperdense vessel or unexpected calcification. Skull: Normal. Negative for fracture or focal lesion. Sinuses/Orbits: No acute finding. Other: None. IMPRESSION: 1. No acute intracranial abnormality. 2. Stable areas of low-density in the white matter. Findings are most compatible with chronic small vessel ischemic disease. Electronically Signed   By: Markus Daft M.D.   On: 11/07/2018 11:51   Mr Jodene Nam Head Wo Contrast  Result Date: 11/07/2018 CLINICAL DATA:  LEFT-sided  numbness. Hypertensive. History of breast cancer, Sjogren syndrome, hypertension and hyperlipidemia. EXAM: MRI  HEAD WITHOUT CONTRAST MRA HEAD WITHOUT CONTRAST TECHNIQUE: Multiplanar, multiecho pulse sequences of the brain and surrounding structures were obtained without intravenous contrast. Angiographic images of the head were obtained using MRA technique without contrast. COMPARISON:  CT HEAD November 07, 2018 FINDINGS: MRI HEAD FINDINGS INTRACRANIAL CONTENTS: Faint reduced diffusion RIGHT thalamus with normalized ADC values and no signal abnormality on remaining sequences. No susceptibility artifact to suggest hemorrhage. Confluent supratentorial white matter FLAIR T2 hyperintensities. No midline shift, mass effect or masses. No parenchymal brain volume loss for age. No abnormal extra-axial fluid collections. VASCULAR: Normal major intracranial vascular flow voids present at skull base. SKULL AND UPPER CERVICAL SPINE: No abnormal sellar expansion. No suspicious calvarial bone marrow signal. Craniocervical junction maintained. SINUSES/ORBITS: The mastoid air-cells and included paranasal sinuses are well-aerated.The included ocular globes and orbital contents are non-suspicious. OTHER: None. MRA HEAD FINDINGS ANTERIOR CIRCULATION: Normal flow related enhancement of the included cervical, petrous, cavernous and supraclinoid internal carotid arteries. Patent anterior communicating artery. Patent anterior and middle cerebral arteries. Mild stenosis LEFT M2 origin. No large vessel occlusion, flow limiting stenosis, or aneurysm. POSTERIOR CIRCULATION: Codominant vertebral artery is dominant. Vertebrobasilar arteries are patent, with normal flow related enhancement of the main branch vessels. Patent posterior cerebral arteries. Small LEFT posterior communicating artery present. Fetal origin RIGHT posterior cerebral artery. No large vessel occlusion, flow limiting stenosis, or aneurysm. ANATOMIC VARIANTS: None. Source data  and MIP images were reviewed. IMPRESSION: MRI HEAD: 1. Acute versus subacute faint RIGHT thalamus nonhemorrhagic infarct. 2. Moderate to severe chronic small vessel ischemic changes. MRA HEAD: 1. No emergent large vessel occlusion or flow-limiting stenosis. Complete circle-of-Willis. Electronically Signed   By: Elon Alas M.D.   On: 11/07/2018 19:24   Mr Brain Wo Contrast  Result Date: 11/07/2018 CLINICAL DATA:  LEFT-sided numbness. Hypertensive. History of breast cancer, Sjogren syndrome, hypertension and hyperlipidemia. EXAM: MRI HEAD WITHOUT CONTRAST MRA HEAD WITHOUT CONTRAST TECHNIQUE: Multiplanar, multiecho pulse sequences of the brain and surrounding structures were obtained without intravenous contrast. Angiographic images of the head were obtained using MRA technique without contrast. COMPARISON:  CT HEAD November 07, 2018 FINDINGS: MRI HEAD FINDINGS INTRACRANIAL CONTENTS: Faint reduced diffusion RIGHT thalamus with normalized ADC values and no signal abnormality on remaining sequences. No susceptibility artifact to suggest hemorrhage. Confluent supratentorial white matter FLAIR T2 hyperintensities. No midline shift, mass effect or masses. No parenchymal brain volume loss for age. No abnormal extra-axial fluid collections. VASCULAR: Normal major intracranial vascular flow voids present at skull base. SKULL AND UPPER CERVICAL SPINE: No abnormal sellar expansion. No suspicious calvarial bone marrow signal. Craniocervical junction maintained. SINUSES/ORBITS: The mastoid air-cells and included paranasal sinuses are well-aerated.The included ocular globes and orbital contents are non-suspicious. OTHER: None. MRA HEAD FINDINGS ANTERIOR CIRCULATION: Normal flow related enhancement of the included cervical, petrous, cavernous and supraclinoid internal carotid arteries. Patent anterior communicating artery. Patent anterior and middle cerebral arteries. Mild stenosis LEFT M2 origin. No large vessel occlusion,  flow limiting stenosis, or aneurysm. POSTERIOR CIRCULATION: Codominant vertebral artery is dominant. Vertebrobasilar arteries are patent, with normal flow related enhancement of the main branch vessels. Patent posterior cerebral arteries. Small LEFT posterior communicating artery present. Fetal origin RIGHT posterior cerebral artery. No large vessel occlusion, flow limiting stenosis, or aneurysm. ANATOMIC VARIANTS: None. Source data and MIP images were reviewed. IMPRESSION: MRI HEAD: 1. Acute versus subacute faint RIGHT thalamus nonhemorrhagic infarct. 2. Moderate to severe chronic small vessel ischemic changes. MRA HEAD: 1. No emergent large vessel occlusion or flow-limiting stenosis. Complete  circle-of-Willis. Electronically Signed   By: Elon Alas M.D.   On: 11/07/2018 19:24    EKG:   Orders placed or performed during the hospital encounter of 11/07/18  . ED EKG  . ED EKG      Management plans discussed with the patient, family and they are in agreement.  CODE STATUS:     Code Status Orders  (From admission, onward)         Start     Ordered   11/07/18 1911  Do not attempt resuscitation (DNR)  Continuous    Question Answer Comment  In the event of cardiac or respiratory ARREST Do not call a "code blue"   In the event of cardiac or respiratory ARREST Do not perform Intubation, CPR, defibrillation or ACLS   In the event of cardiac or respiratory ARREST Use medication by any route, position, wound care, and other measures to relive pain and suffering. May use oxygen, suction and manual treatment of airway obstruction as needed for comfort.      11/07/18 1911        Code Status History    Date Active Date Inactive Code Status Order ID Comments User Context   11/07/2018 1911 11/07/2018 1911 Full Code [id number]  Bettey Costa, MD ED   06/10/2018 1617 10/14/2018 1351 DNR [id number]  [name], MD Outpatient   02/09/2018 1832 02/11/2018 1855 Full Code [id number]  Iann Rodier, Avel Peace, MD  Inpatient   02/07/2018 1023 02/08/2018 2016 Partial Code [id number]  Nicholes Mango, MD Inpatient   02/06/2018 2141 02/07/2018 1022 Full Code [id number]  Henreitta Leber, MD Inpatient    Advance Directive Documentation     Most Recent Value  Type of Advance Directive  Healthcare Power of Attorney, Living will  Pre-existing out of facility DNR order (yellow form or pink MOST form)  -  "MOST" Form in Place?  -      TOTAL TIME TAKING CARE OF THIS PATIENT: 40 minutes.    Avel Peace Clydean Posas M.D on 11/08/2018 at 12:36 PM  Between 7am to 6pm - Pager - [phone]  After 6pm go to [email or web address] - password [email or web address] [place] [facility]  Office  [phone]  CC: Primary care physician; [name], MD   Note: This dictation was prepared with Dragon dictation along with smaller phrase technology. Any transcriptional errors that result from this process are unintentional.

## 2018-11-08 NOTE — Care Management (Signed)
Approximately 5pm patient relayed "they were going to come and teach me how to use this crutch and they never came."   CM was aware that DME representative was going to contact physical therapist to look at the crutch to make sure it was going to meet the intended needs because it is not ordered frequently..  It does not sound as though this happened. Primary nurse asked the occupational therapist that was on the unit at the time to speak with patient then then she contacted a physical therapist that was still in the building. Therapist ambulated patient with the forearm crutch and a cane.  The patient felt "better" with the cane.  CM was then informed that patient was waiting for a cane.  It is after business hours to have one brought to room.  Family and patient aware they can purchase one at drug store/ [employer].  CM spoke with patient and discussed that the morning therapist recommended the forearm crutch.  Informed the patient that she could discharge with the forearm crutch and obtain a cane later.  She declined the recommended forearm crutch.  Family will purchase patient a cane.  She does have a walker in the home

## 2018-11-08 NOTE — Care Management Note (Signed)
Case Management Note  Patient Details  Name: Kim Santiago MRN: [id number] Date of Birth: November 26, 1934  Subjective/Objective:                 To discharge today after cva. Physical therapy has recommended outpatient therapy and will need a loft strand set of crutches (fore arm crutches.  Action/Plan:  /reached out to [facility] for the dme  Expected Discharge Date:  11/08/18               Expected Discharge Plan:     In-House Referral:     Discharge planning Services     Post Acute Care Choice:    Choice offered to:     DME Arranged:    DME Agency:     HH Arranged:    HH Agency:     Status of Service:     If discussed at [employer], dates discussed:    Additional Comments:  Katrina Stack, RN 11/08/2018, 10:31 AM

## 2018-11-08 NOTE — Consult Note (Addendum)
Referring Physician: [name] Nottingham, MD    Chief Complaint: Left-sided weakness  HPI: Kim Santiago is an 84 y.o. female with past medical history of MI, thyroid cancer, IBS, hyperlipidemia, breast cancer status post left mastectomy, chronic kidney disease, Sjogren's syndrome, hypothyroidism, and hypertension presenting to the ED with chief complaints of left sided weakness and elevated blood pressure. She reports onset of symptoms since 11/04/2018 with worsening symptoms over the weekend.  She described associated symptoms of pressure in her head and vision changes in the left eye for the past 4 days.  Patient's daughter called her PCP with an elevated blood pressure of 201/74 who increased her losartan to 25 mg daily. Denies associated altered sensorium, speech abnormality, cranial nerve deficit, seizures, dizziness,  nausea or vomiting, syncope or LOC.  Due to her persistent symptoms of left-sided weakness, left eye vision change and elevated BP, patient was advised by her PCP to be evaluated in the ER. On arrival to the ED, she was afebrile with blood pressure 181/77 mm Hg and pulse rate 76 beats/min. There were no focal neurological deficits; she was alert and oriented x4, and she did not demonstrate any memory deficits.  Code stroke was initiated and patient was evaluated by tele-neurologist.  Initial NIH stroke scale 0.  A non-contrast head CT showed no acute intracranial abnormality.  Patient was deemed not a candidate for IV alteplase due to being outside of the window period. Follow-up MRI of the brain showed acute versus subacute faint right thalamus nonhemorrhagic infarct.  MRA head showed no emergent or large vessel occlusion or flow-limiting stenosis.  She was admitted for further stroke work-up and management.  Date last known well: Date: 11/04/2018 Time last known well: Unable to determine tPA Given: No: outside window period.  Past Medical History:  Diagnosis Date  . Arthritis   .  Breast cancer ([place]) 2017   left mastectomy done 11/2015  . Breast cancer in female [facility]) 11/18/2015   Left: 3.9 cm tumor, T2, 1/2 sentinel nodes positive for macro metastatic disease, N1, 3 negative nodes in the axillary tail, ER+,PR+, Her 2 neu, low Mammoprint score  . Cancer [facility])    thyroid takes levothyroxine  . Chronic kidney disease    UTI  . Genetic screening March 2017.   Mammoprint of left breast cancer: Low risk for recurrence.   . Hypertension   . hypothyroidism    secondary to thyroidectomy for thyroid ca  . Hypothyroidism   . Lupus ([facility])    subcutaneous  . Menopause 40s   natural, hot flashes and mood lability now gone, off prempro 7 months  . Myocardial infarction ([place]) 2013  . Osteoporosis    Osteopenia  . Rosacea   . Sjoegren syndrome   . Stress-induced cardiomyopathy September of 2013   EF 35%. Peak troponin was 1.8.    Past Surgical History:  Procedure Laterality Date  . BACK SURGERY    . BREAST BIOPSY Left 10/30/15   positive, done in Dr. Dwyane Luo office  . CARDIAC CATHETERIZATION  05/2012   [facility]. No significant CAD. Ejection fraction of 35% due to stress-induced cardiomyopathy.  . CHOLECYSTECTOMY    . COLONOSCOPY    . DILATION AND CURETTAGE OF UTERUS    . KYPHOSIS SURGERY  Feb 2008   L1, Dr. Mauri Pole  . LUMBAR DISC SURGERY     L4-L5  . MASTECTOMY Left 11/18/2015   positive  . SENTINEL NODE BIOPSY Left 11/18/2015   Procedure: SENTINEL NODE BIOPSY;  Surgeon:  Robert Bellow, MD;  Location: [facility] ORS;  Service: General;  Laterality: Left;  . SHOULDER ARTHROSCOPY  2004   Left, Dr. Jefm Bryant  . SIMPLE MASTECTOMY WITH AXILLARY SENTINEL NODE BIOPSY Left 11/18/2015   Procedure: SIMPLE MASTECTOMY;  Surgeon: Robert Bellow, MD;  Location: [facility] ORS;  Service: General;  Laterality: Left;  . SPINE SURGERY     L4-5 diskectomy  . THYROIDECTOMY     Thyroid Cancer  . TONSILLECTOMY    . TUBAL LIGATION      Family History  Problem Relation Age of Onset  . COPD  Father   . Cancer Father        esophageal  . Kidney disease Mother   . Heart disease Mother   . Kidney disease Sister   . Cancer Brother 1       colon cancer (both brothers)  . Heart attack Brother 39  . Heart disease Brother   . Breast cancer Neg Hx    Social History:  reports that she has never smoked. She has never used smokeless tobacco. She reports that she does not drink alcohol or use drugs.  Allergies:  Allergies  Allergen Reactions  . Azathioprine Nausea And Vomiting    Severe vomiting  . Hydroxychloroquine Hives and Nausea And Vomiting  . Mycophenolate Mofetil Nausea Only  . Amoxicillin Rash    Has patient had a PCN reaction causing immediate rash, facial/tongue/throat swelling, SOB or lightheadedness with hypotension: Unknown Has patient had a PCN reaction causing severe rash involving mucus membranes or skin necrosis: Unknown Has patient had a PCN reaction that required hospitalization: Unknown Has patient had a PCN reaction occurring within the last 10 years: Unknown If all of the above answers are "NO", then may proceed with Cephalosporin use. Tolerates ceftriaxone and Ancef  . Codeine Nausea And Vomiting  . Naprosyn [Naproxen] Swelling  . Orudis [Ketoprofen] Hives  . Sulfathiazole Rash    Medications:  I have reviewed the patient's current medications. Prior to Admission:  Medications Prior to Admission  Medication Sig Dispense Refill Last Dose  . aspirin 81 MG tablet Take 81 mg by mouth daily.   11/07/2018 at 0800  . Calcium Carbonate-Vitamin D (CALCIUM + D) 600-200 MG-UNIT TABS Take 2 tablets by mouth 2 (two) times daily.    11/07/2018 at 0800  . cholecalciferol (VITAMIN D) 1000 UNITS tablet Take 2,000 Units by mouth daily.    11/07/2018 at 0800  . CRANBERRY PO Take 1 capsule by mouth 2 (two) times daily.   11/07/2018 at 0800  . letrozole (FEMARA) 2.5 MG tablet Take 1 tablet (2.5 mg total) by mouth daily. 90 tablet 3 11/06/2018 at 2230  . levothyroxine (SYNTHROID,  LEVOTHROID) 75 MCG tablet Take 1 tablet by mouth (71mcg total) daily 90 tablet 0 11/06/2018 at 2230  . losartan (COZAAR) 25 MG tablet Take 2 tablets (50 mg total) by mouth daily. 30 tablet 0 11/07/2018 at 0800  . metoprolol succinate (TOPROL-XL) 25 MG 24 hr tablet Take 1 tablet by mouth daily 90 tablet 0 11/07/2018 at 0800  . mirtazapine (REMERON) 7.5 MG tablet Take 1 tablet (7.5 mg total) by mouth at bedtime. 90 tablet 0 11/06/2018 at 2230  . Probiotic Product (PROBIOTIC PO) Take 1 tablet by mouth daily.   11/07/2018 at 0800  . sertraline (ZOLOFT) 50 MG tablet TAKE 1 TABLET BY MOUTH EVERY DAY 90 tablet 2 11/07/2018 at 0800  . sodium fluoride (DENTA 5000 PLUS) 1.1 % CREA dental cream Take 1  application by mouth daily.   Past Week at Unknown time  . ciprofloxacin (CIPRO) 500 MG tablet Take 1 tablet (500 mg total) by mouth 2 (two) times daily. (Patient not taking: Reported on 11/07/2018) 14 tablet 0 Completed Course at Unknown time  . furosemide (LASIX) 20 MG tablet 1 tablet every other day as needed 30 tablet 3 prn at prn  . metolazone (ZAROXOLYN) 2.5 MG tablet TAKE 1 TABLET (2.5 MG TOTAL) BY MOUTH DAILY. 30 MINUTES BEFORE FUROSEMIDE DOSE 90 tablet 1 prn at prn  . nitroGLYCERIN (NITROSTAT) 0.4 MG SL tablet Place 1 tablet (0.4 mg total) under the tongue every 5 (five) minutes as needed for chest pain. Maximum 3 doses 50 tablet 3 prn at prn  . omeprazole (PRILOSEC) 20 MG capsule Take 1 capsule by mouth twice a day before a meal 180 capsule 0 prn at prn  . ondansetron (ZOFRAN ODT) 4 MG disintegrating tablet Take 1 tablet (4 mg total) by mouth every 8 (eight) hours as needed for nausea or vomiting. 20 tablet 0 prn at prn  . polyethylene glycol (MIRALAX / GLYCOLAX) packet Take 17 g by mouth daily.   prn at prn  . tizanidine (ZANAFLEX) 2 MG capsule Take 1 capsule (2 mg total) by mouth 3 (three) times daily. 90 capsule 0 prn at prn  . traMADol (ULTRAM) 50 MG tablet Take 1 tablet (50 mg total) by mouth every 8 (eight) hours  as needed. 60 tablet 2 prn at prn  . triamcinolone ointment (KENALOG) 0.1 % Apply 1 application topically daily.  0 prn at prn   Scheduled: . aspirin EC  81 mg Oral Daily  . calcium-vitamin D  2 tablet Oral BID  . cholecalciferol  2,000 Units Oral Daily  . clopidogrel  75 mg Oral Daily  . enoxaparin (LOVENOX) injection  40 mg Subcutaneous Q24H  . letrozole  2.5 mg Oral Daily  . levothyroxine  75 mcg Oral QHS  . losartan  50 mg Oral Daily  . metoprolol succinate  25 mg Oral Daily  . mirtazapine  7.5 mg Oral QHS  . pantoprazole  40 mg Oral Daily  . polyethylene glycol  17 g Oral Daily  . sertraline  50 mg Oral Daily  . simvastatin  20 mg Oral q1800  . tiZANidine  2 mg Oral TID    ROS: History obtained from the patient   General ROS: negative for - chills, fatigue, fever, night sweats, weight gain or weight loss Psychological ROS: negative for - behavioral disorder, hallucinations, memory difficulties, mood swings or suicidal ideation Ophthalmic ROS: negative for - blurry vision, double vision, eye pain or loss of vision ENT ROS: negative for - epistaxis, nasal discharge, oral lesions, sore throat, tinnitus or vertigo Allergy and Immunology ROS: negative for - hives or itchy/watery eyes Hematological and Lymphatic ROS: negative for - bleeding problems, bruising or swollen lymph nodes Endocrine ROS: negative for - galactorrhea, hair pattern changes, polydipsia/polyuria or temperature intolerance Respiratory ROS: negative for - cough, hemoptysis, shortness of breath or wheezing Cardiovascular ROS: negative for - chest pain, dyspnea on exertion, edema or irregular heartbeat Gastrointestinal ROS: negative for - abdominal pain, diarrhea, hematemesis, nausea/vomiting or stool incontinence Genito-Urinary ROS: negative for - dysuria, hematuria, incontinence or urinary frequency/urgency Musculoskeletal ROS: negative for - joint swelling or muscular weakness Neurological ROS: as noted in  HPI Dermatological ROS: negative for rash and skin lesion changes  Physical Examination: Blood pressure 124/66, pulse 70, temperature (!) 97.3 F (36.3 C), temperature  source Oral, resp. rate 18, height 5\' 4"  (1.626 m), weight 59.1 kg, SpO2 96 %.   HEENT-  Normocephalic, no lesions, without obvious abnormality.  Normal external eye and conjunctiva.  Normal TM's bilaterally.  Normal auditory canals and external ears. Normal external nose, mucus membranes and septum.  Normal pharynx. Cardiovascular- S1, S2 normal, pulses palpable throughout   Lungs- chest clear, no wheezing, rales, normal symmetric air entry Abdomen- soft, non-tender; bowel sounds normal; no masses,  no organomegaly Extremities- no edema Lymph-no adenopathy palpable Musculoskeletal-no joint tenderness, deformity or swelling Skin-warm and dry, no hyperpigmentation, vitiligo, or suspicious lesions  Neurological Exam   Mental Status: Alert, oriented, thought content appropriate.  Speech fluent without evidence of aphasia.  Able to follow 3 step commands without difficulty. Attention span and concentration seemed appropriate  Cranial Nerves: II: Discs flat bilaterally; Visual fields grossly normal, pupils equal, round, reactive to light and accommodation III,IV, VI: ptosis not present, extra-ocular motions intact bilaterally V,VII: smile symmetric, facial light touch sensation intact VIII: hearing normal bilaterally IX,X: gag reflex present XI: bilateral shoulder shrug XII: midline tongue extension Motor: Right :  Upper extremity   5/5 Without pronator drift      Left: Upper extremity   5/5 without pronator drift Right:   Lower extremity   5/5                                          Left: Lower extremity   4+/5 Tone and bulk:normal tone throughout; no atrophy noted Sensory: Pinprick and light touch intact bilaterally Deep Tendon Reflexes: 2+ and symmetric throughout Plantars: Right: mute                               Left: mute Cerebellar: Finger-to-nose testing intact bilaterally. Heel to shin testing normal bilaterally Gait: not tested due to safety concerns  Data Reviewed  Laboratory Studies:  Basic Metabolic Panel: Recent Labs  Lab 11/07/18 1127  NA 132*  K 4.9  CL 99  CO2 26  GLUCOSE 102*  BUN 12  CREATININE 0.54  CALCIUM 8.6*    Liver Function Tests: Recent Labs  Lab 11/07/18 1127  AST 25  ALT 10  ALKPHOS 70  BILITOT 0.6  PROT 6.7  ALBUMIN 3.8   No results for input(s): LIPASE, AMYLASE in the last 168 hours. No results for input(s): AMMONIA in the last 168 hours.  CBC: Recent Labs  Lab 11/07/18 1127  WBC 3.3*  3.4*  NEUTROABS 2.0  HGB 11.2*  11.1*  HCT 32.9*  32.4*  MCV 97.1  96.1  PLT 152  141*    Cardiac Enzymes: No results for input(s): CKTOTAL, CKMB, CKMBINDEX, TROPONINI in the last 168 hours.  BNP: Invalid input(s): POCBNP  CBG: No results for input(s): GLUCAP in the last 168 hours.  Microbiology: Results for orders placed or performed during the hospital encounter of 09/23/18  Urine Culture     Status: Abnormal   Collection Time: 09/23/18  2:59 PM  Result Value Ref Range Status   Specimen Description   Final    URINE, CLEAN CATCH Performed at [facility], [address], [place], [place] [address]    Special Requests   Final    NONE Performed at [facility], [address] [place], [place] [address]  Culture 60,000 COLONIES/mL SERRATIA MARCESCENS (A)  Final   Report Status 09/26/2018 FINAL  Final   Organism ID, Bacteria SERRATIA MARCESCENS (A)  Final      Susceptibility   Serratia marcescens - MIC*    CEFAZOLIN >=64 RESISTANT Resistant     CEFTRIAXONE <=1 SENSITIVE Sensitive     CIPROFLOXACIN <=0.25 SENSITIVE Sensitive     GENTAMICIN <=1 SENSITIVE Sensitive     NITROFURANTOIN 256 RESISTANT Resistant     TRIMETH/SULFA <=20 SENSITIVE Sensitive     * 60,000 COLONIES/mL SERRATIA MARCESCENS     Coagulation Studies: Recent Labs    11/07/18 1127  LABPROT 12.1  INR 0.9    Urinalysis:  Recent Labs  Lab 11/07/18 1128  COLORURINE STRAW*  LABSPEC 1.005  PHURINE 6.0  GLUCOSEU NEGATIVE  HGBUR NEGATIVE  BILIRUBINUR NEGATIVE  KETONESUR NEGATIVE  PROTEINUR NEGATIVE  NITRITE NEGATIVE  LEUKOCYTESUR NEGATIVE    Lipid Panel:    Component Value Date/Time   CHOL 137 11/08/2018 0426   CHOL 157 05/31/2012 0338   TRIG 85 11/08/2018 0426   TRIG 57 05/31/2012 0338   HDL 30 (L) 11/08/2018 0426   HDL 38 (L) 05/31/2012 0338   CHOLHDL 4.6 11/08/2018 0426   VLDL 17 11/08/2018 0426   VLDL 11 05/31/2012 0338   LDLCALC 90 11/08/2018 0426   LDLCALC 108 (H) 05/31/2012 0338    HgbA1C:  Lab Results  Component Value Date   HGBA1C 4.9 11/08/2018    Urine Drug Screen:  No results found for: LABOPIA, COCAINSCRNUR, LABBENZ, AMPHETMU, THCU, LABBARB  Alcohol Level: No results for input(s): ETH in the last 168 hours.  Other results: EKG: normal EKG, normal sinus rhythm, unchanged from previous tracings.  Imaging: Ct Head Wo Contrast  Result Date: 11/07/2018 CLINICAL DATA:  84 year old with possible stroke. EXAM: CT HEAD WITHOUT CONTRAST TECHNIQUE: Contiguous axial images were obtained from the base of the skull through the vertex without intravenous contrast. COMPARISON:  02/09/2018 FINDINGS: Brain: No evidence for acute hemorrhage, mass lesion, midline shift, hydrocephalus or large infarct. Again noted is low-density in the periventricular and subcortical white matter suggesting chronic changes. Vascular: No hyperdense vessel or unexpected calcification. Skull: Normal. Negative for fracture or focal lesion. Sinuses/Orbits: No acute finding. Other: None. IMPRESSION: 1. No acute intracranial abnormality. 2. Stable areas of low-density in the white matter. Findings are most compatible with chronic small vessel ischemic disease. Electronically Signed   By: Markus Daft M.D.   On: 11/07/2018  11:51   Mr Jodene Nam Head Wo Contrast  Result Date: 11/07/2018 CLINICAL DATA:  LEFT-sided numbness. Hypertensive. History of breast cancer, Sjogren syndrome, hypertension and hyperlipidemia. EXAM: MRI HEAD WITHOUT CONTRAST MRA HEAD WITHOUT CONTRAST TECHNIQUE: Multiplanar, multiecho pulse sequences of the brain and surrounding structures were obtained without intravenous contrast. Angiographic images of the head were obtained using MRA technique without contrast. COMPARISON:  CT HEAD November 07, 2018 FINDINGS: MRI HEAD FINDINGS INTRACRANIAL CONTENTS: Faint reduced diffusion RIGHT thalamus with normalized ADC values and no signal abnormality on remaining sequences. No susceptibility artifact to suggest hemorrhage. Confluent supratentorial white matter FLAIR T2 hyperintensities. No midline shift, mass effect or masses. No parenchymal brain volume loss for age. No abnormal extra-axial fluid collections. VASCULAR: Normal major intracranial vascular flow voids present at skull base. SKULL AND UPPER CERVICAL SPINE: No abnormal sellar expansion. No suspicious calvarial bone marrow signal. Craniocervical junction maintained. SINUSES/ORBITS: The mastoid air-cells and included paranasal sinuses are well-aerated.The included ocular globes and orbital contents are non-suspicious. OTHER: None. MRA  HEAD FINDINGS ANTERIOR CIRCULATION: Normal flow related enhancement of the included cervical, petrous, cavernous and supraclinoid internal carotid arteries. Patent anterior communicating artery. Patent anterior and middle cerebral arteries. Mild stenosis LEFT M2 origin. No large vessel occlusion, flow limiting stenosis, or aneurysm. POSTERIOR CIRCULATION: Codominant vertebral artery is dominant. Vertebrobasilar arteries are patent, with normal flow related enhancement of the main branch vessels. Patent posterior cerebral arteries. Small LEFT posterior communicating artery present. Fetal origin RIGHT posterior cerebral artery. No large vessel  occlusion, flow limiting stenosis, or aneurysm. ANATOMIC VARIANTS: None. Source data and MIP images were reviewed. IMPRESSION: MRI HEAD: 1. Acute versus subacute faint RIGHT thalamus nonhemorrhagic infarct. 2. Moderate to severe chronic small vessel ischemic changes. MRA HEAD: 1. No emergent large vessel occlusion or flow-limiting stenosis. Complete circle-of-Willis. Electronically Signed   By: Elon Alas M.D.   On: 11/07/2018 19:24   Mr Brain Wo Contrast  Result Date: 11/07/2018 CLINICAL DATA:  LEFT-sided numbness. Hypertensive. History of breast cancer, Sjogren syndrome, hypertension and hyperlipidemia. EXAM: MRI HEAD WITHOUT CONTRAST MRA HEAD WITHOUT CONTRAST TECHNIQUE: Multiplanar, multiecho pulse sequences of the brain and surrounding structures were obtained without intravenous contrast. Angiographic images of the head were obtained using MRA technique without contrast. COMPARISON:  CT HEAD November 07, 2018 FINDINGS: MRI HEAD FINDINGS INTRACRANIAL CONTENTS: Faint reduced diffusion RIGHT thalamus with normalized ADC values and no signal abnormality on remaining sequences. No susceptibility artifact to suggest hemorrhage. Confluent supratentorial white matter FLAIR T2 hyperintensities. No midline shift, mass effect or masses. No parenchymal brain volume loss for age. No abnormal extra-axial fluid collections. VASCULAR: Normal major intracranial vascular flow voids present at skull base. SKULL AND UPPER CERVICAL SPINE: No abnormal sellar expansion. No suspicious calvarial bone marrow signal. Craniocervical junction maintained. SINUSES/ORBITS: The mastoid air-cells and included paranasal sinuses are well-aerated.The included ocular globes and orbital contents are non-suspicious. OTHER: None. MRA HEAD FINDINGS ANTERIOR CIRCULATION: Normal flow related enhancement of the included cervical, petrous, cavernous and supraclinoid internal carotid arteries. Patent anterior communicating artery. Patent anterior and  middle cerebral arteries. Mild stenosis LEFT M2 origin. No large vessel occlusion, flow limiting stenosis, or aneurysm. POSTERIOR CIRCULATION: Codominant vertebral artery is dominant. Vertebrobasilar arteries are patent, with normal flow related enhancement of the main branch vessels. Patent posterior cerebral arteries. Small LEFT posterior communicating artery present. Fetal origin RIGHT posterior cerebral artery. No large vessel occlusion, flow limiting stenosis, or aneurysm. ANATOMIC VARIANTS: None. Source data and MIP images were reviewed. IMPRESSION: MRI HEAD: 1. Acute versus subacute faint RIGHT thalamus nonhemorrhagic infarct. 2. Moderate to severe chronic small vessel ischemic changes. MRA HEAD: 1. No emergent large vessel occlusion or flow-limiting stenosis. Complete circle-of-Willis. Electronically Signed   By: Elon Alas M.D.   On: 11/07/2018 19:24   Assessment: 84 y.o. female  with past medical history of MI, thyroid cancer, IBS, hyperlipidemia, breast cancer status post left mastectomy, chronic kidney disease, Sjogren's syndrome, hypothyroidism, and hypertension presenting to the ED with chief complaints of left sided weakness, headache, left vision changes and  elevated blood pressure.reviewed and shows acute versus subacute faint RIGHT thalamus nonhemorrhagic infarct.  Etiology likely small vessel disease in the setting of elevated blood pressure on presentation.  MRA showed no emergent large vessel occlusion or flow-limiting stenosis.  Hemoglobin A1c 4.9, LDL 90.  Patient reports that she was on aspirin 81 mg prior to this event.  Stroke Risk Factors - family history, hyperlipidemia and hypertension  Plan: 1.Continue Aspirin 81 mg/day with intensive management of vascular risk factor to  keep systolic BP (SBP) <578 mm Hg (130 mm Hg if diabetic). 2. Start statin with goal low density lipoprotein (LDL) <70 mg/dl 3. PT consult, OT consult, Speech consult 4. Echocardiogram pending 5.  Carotid dopplers pending 6. Telemetry monitoring 7. Frequent neuro checks 8. Patient to follow-up on an outpatient basis with her neurology/cardiology to follow-up on her pending test results.   This patient was staffed with Dr. Irish Elders, Alease Frame who personally evaluated patient, reviewed documentation and agreed with assessment and plan of care as above.  Rufina Falco, DNP, FNP-BC Board certified Nurse Practitioner Neurology Department  11/08/2018, 11:19 AM

## 2018-11-08 NOTE — Progress Notes (Signed)
Chart reviewed, pt visited. No speech, language or swallowing deficits reported or noted. Speech is fluent and appropriate. ST observed with sips of liquid with no dysphagia. Nsg reports Pt passed swallow screen and has not shown any difficulty. No ST eval indicated at this time.

## 2018-11-08 NOTE — Progress Notes (Signed)
*  PRELIMINARY RESULTS* Echocardiogram 2D Echocardiogram has been performed.  Wallie Char Juno Alers 11/08/2018, 12:39 PM

## 2018-11-08 NOTE — Evaluation (Signed)
Physical Therapy Evaluation Patient Details Name: Kim Santiago MRN: [id number] DOB: Jul 08, 1935 Today's Date: 11/08/2018   History of Present Illness  presented to ER secondary to L UE/LE weakness; admitted for TIA/CVA work up.  MRI significant for R thalamic infarct.  Clinical Impression  Upon evaluation, patient alert and oriented; follows commands and demonstrates good effort with all mobility tasks. Mild L UE/LE strength and coordination deficits noted; mild sensory deficit with confrontational testing.  Able to complete bed mobility indep; sit/stand, basic transfers and gait (200') without assist device, min assist.  Excessive L lateral sway with intermittent L LOB during head turns/dynamic gait components, cga/min assist for recovery.  Did trial lofstrand (200'), close sup, noted with improved symmetry, mechanics and overall gait fluidity; able to complete head turns, changes of direction without LOB.  do recommend continued use of loftstrand with all gait efforts; patient voiced agreement/understanding. Would benefit from skilled PT to address above deficits and promote optimal return to PLOF;  Recommend transition to home with outpatient PT/OT follow up as appropriate.    Follow Up Recommendations Outpatient PT    Equipment Recommendations  (loftstrand crutches)    Recommendations for Other Services       Precautions / Restrictions Precautions Precautions: Fall Restrictions Weight Bearing Restrictions: No      Mobility  Bed Mobility Overal bed mobility: Modified Independent                Transfers Overall transfer level: Needs assistance Equipment used: None Transfers: Sit to/from Stand Sit to Stand: Min guard         General transfer comment: UE support for optimal stabilization  Ambulation/Gait Ambulation/Gait assistance: Min guard;Min assist Gait Distance (Feet): 200 Feet Assistive device: None       General Gait Details: narrowed BOS with  inconsistent L LE foot placement; excessive L LE sway; guarded trunk posturing with limited arm swing.  L lateral LOB with dynamic gait components, min assist to recover  Stairs            Wheelchair Mobility    Modified Rankin (Stroke Patients Only)       Balance Overall balance assessment: Needs assistance Sitting-balance support: No upper extremity supported;Feet supported Sitting balance-Leahy Scale: Good     Standing balance support: No upper extremity supported Standing balance-Leahy Scale: Fair                               Pertinent Vitals/Pain Pain Assessment: No/denies pain    Home Living Family/patient expects to be discharged to:: Private residence Living Arrangements: Children Available Help at Discharge: Family;Available PRN/intermittently Type of Home: House Home Access: Stairs to enter Entrance Stairs-Rails: Left Entrance Stairs-Number of Steps: 3 Home Layout: Two level;Able to live on main level with bedroom/bathroom Home Equipment: None      Prior Function Level of Independence: Independent         Comments: Indep with ADLs, household and community mobilization; denies fall history; + driving     Hand Dominance        Extremity/Trunk Assessment   Upper Extremity Assessment Upper Extremity Assessment: (L UE grossly 4-/5 throughout; mild sensory deficit and coordination/fine motor deficit)    Lower Extremity Assessment Lower Extremity Assessment: (L UE grossly 4-/5 throughout; mild sensory deficit and coordination/fine motor deficit)       Communication   Communication: No difficulties  Cognition Arousal/Alertness: Awake/alert Behavior During Therapy: WFL for tasks assessed/performed  Overall Cognitive Status: Within Functional Limits for tasks assessed                                        General Comments      Exercises Other Exercises Other Exercises: Toilet transfer, ambulatory with RW,  cga/close sup; sit/stand, basic transfers and standing balance (for hygiene, clothing management, hand hygiene), cga/close sup Other Exercises: 200' with loftstrand, close sup-improved symmetry, mechanics and overall gait fluidity; able to complete head turns, changes of direction without LOB.  do recommend continued use of loftstrand with all gait efforts; patient voiced agreement/understanding.   Assessment/Plan    PT Assessment Patient needs continued PT services  PT Problem List Decreased strength;Decreased activity tolerance;Decreased balance;Decreased mobility;Decreased coordination;Decreased knowledge of use of DME;Decreased safety awareness;Decreased knowledge of precautions;Impaired sensation       PT Treatment Interventions DME instruction;Gait training;Therapeutic exercise;Stair training;Functional mobility training;Therapeutic activities;Balance training;Patient/family education    PT Goals (Current goals can be found in the Care Plan section)  Acute Rehab PT Goals Patient Stated Goal: to return home PT Goal Formulation: With patient Time For Goal Achievement: 11/22/18 Potential to Achieve Goals: Good    Frequency 7X/week   Barriers to discharge        Co-evaluation               AM-PAC PT "6 Clicks" Mobility  Outcome Measure Help needed turning from your back to your side while in a flat bed without using bedrails?: None Help needed moving from lying on your back to sitting on the side of a flat bed without using bedrails?: None Help needed moving to and from a bed to a chair (including a wheelchair)?: A Little Help needed standing up from a chair using your arms (e.g., wheelchair or bedside chair)?: A Little Help needed to walk in hospital room?: A Little Help needed climbing 3-5 steps with a railing? : A Little 6 Click Score: 20    End of Session Equipment Utilized During Treatment: Gait belt Activity Tolerance: Patient tolerated treatment well Patient  left: in chair;with call bell/phone within reach;with chair alarm set Nurse Communication: Mobility status PT Visit Diagnosis: Hemiplegia and hemiparesis Hemiplegia - Right/Left: Left Hemiplegia - dominant/non-dominant: Non-dominant Hemiplegia - caused by: Cerebral infarction    Time: 7062-3762 PT Time Calculation (min) (ACUTE ONLY): 32 min   Charges:   PT Evaluation $PT Eval Moderate Complexity: 1 Mod PT Treatments $Gait Training: 8-22 mins $Therapeutic Activity: 8-22 mins        Estiven Kohan H. Owens Shark, PT, DPT, NCS 11/08/18, 10:39 AM [phone]

## 2018-11-08 NOTE — Plan of Care (Signed)
  Problem: Education: Goal: Knowledge of secondary prevention will improve Outcome: Progressing Goal: Knowledge of patient specific risk factors addressed and post discharge goals established will improve Outcome: Progressing   Problem: Health Behavior/Discharge Planning: Goal: Ability to manage health-related needs will improve Outcome: Progressing   Problem: Self-Care: Goal: Ability to participate in self-care as condition permits will improve Outcome: Progressing Goal: Ability to communicate needs accurately will improve Outcome: Progressing   Problem: Nutrition: Goal: Risk of aspiration will decrease Outcome: Progressing   Problem: Health Behavior/Discharge Planning: Goal: Ability to manage health-related needs will improve Outcome: Progressing   Problem: Clinical Measurements: Goal: Ability to maintain clinical measurements within normal limits will improve Outcome: Progressing Goal: Will remain free from infection Outcome: Progressing Goal: Respiratory complications will improve Outcome: Progressing   Problem: Activity: Goal: Risk for activity intolerance will decrease Outcome: Progressing   Problem: Nutrition: Goal: Adequate nutrition will be maintained Outcome: Progressing   Problem: Coping: Goal: Level of anxiety will decrease Outcome: Progressing   Problem: Elimination: Goal: Will not experience complications related to bowel motility Outcome: Progressing

## 2018-11-09 NOTE — Telephone Encounter (Signed)
Transition Care Management Follow-up Telephone Call  How have you been since you were released from the hospital? Feeling better but still has left sided weakness.   Do you understand why you were in the hospital? yes   Do you understand the discharge instrcutions? yes  Items Reviewed:  Medications reviewed: yes  Allergies reviewed: yes  Dietary changes reviewed: yes  Referrals reviewed: yes   Functional Questionnaire:   Activities of Daily Living (ADLs):   She states they are independent in the following: feeding, continence, grooming, toileting and dressing States they require assistance with the following: ambulation and bathing and hygiene   Any transportation issues/concerns?: no   Any patient concerns? no   Confirmed importance and date/time of follow-up visits scheduled: yes   Confirmed with patient if condition begins to worsen call PCP or go to the ER.  Patient was given the Call-a-Nurse line [phone]: yes

## 2018-11-09 NOTE — Progress Notes (Signed)
Cardiology Office Note   Date:  11/11/2018   ID:  Kim Santiago, DOB [birth year], MRN [id number]  PCP:  [name], MD  Cardiologist:  Kathlyn Sacramento, MD   Chief Complaint  Patient presents with  . New Patient (Initial Visit)    referred by Dr. Derrel Nip for chest pain. Patient states she recently has a stroke. Meds reviewed verbally with patient.       History of Present Illness: Kim Santiago is a 84 y.o. female who is referred by Dr. Helene Kelp L. Derrel Nip, MD for chest pain. Previously last seen by me in 2014 for hypertension, NSTEMI, and stress-induced cardiomyopathy. She has a history of HTN, HL,  Sjogren's syndrome, remote breast cancer and chronic kidney disease stage III.   She had cardiac catheterization done in September 2013 in the setting of a small non-ST elevation myocardial infarction.  It showed minimal coronary artery disease with EF of 35% with classic wall motion abnormalities of stress-induced cardiomyopathy.  Ejection fraction subsequently normalized.  She had few episodes of substernal chest pain that happened few months ago.  All these episodes happened at night or early morning hours and woke her up from sleep.  It was described as burning and tightness feeling very similar to her reflux symptoms.  She has not had any recent symptoms and reports no exertional chest pain or shortness of breath.  She presented to the ED on 11/07/2018 with left leg weakness, left arm and leg numbness, and high blood pressure (162/64). CT head scan w/o contrast was performed which demonstrated no acute intracranial abnormality and stable areas of low-density in the white matter. She was diagnosed with cerebrovascular accident (CVA), unspecified mechanism, hyponatremia, and hypertension. She had an echocardiogram on 11/08/2018 which showed an LV EF: 60 - 65% and mild mitral annular calcification present.   The patient is doing well today. She is accompanied by her daughter. Three months  ago she woke up with chest pain, but she believed it was from acid reflux and she has not had any recent episodes in the last month. She felt pressure and burning in her chest which only lasted a few minutes. A neurologist started her on Plavix on 11/10/2018. She has recently not been physically active due to illness. Will be starting physical therapy on 11/21/2018. They deny chest pain, SOB, palpitations or any other related symptoms or complaints at this time.    Past Medical History:  Diagnosis Date  . Arthritis   . Breast cancer ([place]) 2017   left mastectomy done 11/2015  . Breast cancer in female [facility]) 11/18/2015   Left: 3.9 cm tumor, T2, 1/2 sentinel nodes positive for macro metastatic disease, N1, 3 negative nodes in the axillary tail, ER+,PR+, Her 2 neu, low Mammoprint score  . Cancer [facility])    thyroid takes levothyroxine  . Chronic kidney disease    UTI  . Genetic screening March 2017.   Mammoprint of left breast cancer: Low risk for recurrence.   . Hypertension   . hypothyroidism    secondary to thyroidectomy for thyroid ca  . Hypothyroidism   . Lupus ([facility])    subcutaneous  . Menopause 40s   natural, hot flashes and mood lability now gone, off prempro 7 months  . Myocardial infarction ([place]) 2013  . Osteoporosis    Osteopenia  . Rosacea   . Sjoegren syndrome   . Stress-induced cardiomyopathy September of 2013   EF 35%. Peak troponin was 1.8.  Past Surgical History:  Procedure Laterality Date  . BACK SURGERY    . BREAST BIOPSY Left 10/30/15   positive, done in Dr. Dwyane Luo office  . CARDIAC CATHETERIZATION  05/2012   [facility]. No significant CAD. Ejection fraction of 35% due to stress-induced cardiomyopathy.  . CHOLECYSTECTOMY    . COLONOSCOPY    . DILATION AND CURETTAGE OF UTERUS    . KYPHOSIS SURGERY  Feb 2008   L1, Dr. Mauri Pole  . LUMBAR DISC SURGERY     L4-L5  . MASTECTOMY Left 11/18/2015   positive  . SENTINEL NODE BIOPSY Left 11/18/2015   Procedure: SENTINEL NODE  BIOPSY;  Surgeon: Robert Bellow, MD;  Location: [facility] ORS;  Service: General;  Laterality: Left;  . SHOULDER ARTHROSCOPY  2004   Left, Dr. Jefm Bryant  . SIMPLE MASTECTOMY WITH AXILLARY SENTINEL NODE BIOPSY Left 11/18/2015   Procedure: SIMPLE MASTECTOMY;  Surgeon: Robert Bellow, MD;  Location: [facility] ORS;  Service: General;  Laterality: Left;  . SPINE SURGERY     L4-5 diskectomy  . THYROIDECTOMY     Thyroid Cancer  . TONSILLECTOMY    . TUBAL LIGATION       Current Outpatient Medications  Medication Sig Dispense Refill  . aspirin EC 81 MG EC tablet Take 1 tablet (81 mg total) by mouth daily. 180 tablet 0  . atorvastatin (LIPITOR) 20 MG tablet Take 1 tablet (20 mg total) by mouth daily at 6 PM. 90 tablet 0  . Calcium Carbonate-Vitamin D (CALCIUM + D) 600-200 MG-UNIT TABS Take 2 tablets by mouth 2 (two) times daily.     . cholecalciferol (VITAMIN D) 1000 UNITS tablet Take 2,000 Units by mouth daily.     . clopidogrel (PLAVIX) 75 MG tablet Take 75 mg by mouth daily.    Marland Kitchen CRANBERRY PO Take 1 capsule by mouth 2 (two) times daily.    . furosemide (LASIX) 20 MG tablet 1 tablet every other day as needed 30 tablet 3  . letrozole (FEMARA) 2.5 MG tablet Take 1 tablet (2.5 mg total) by mouth daily. 90 tablet 3  . levothyroxine (SYNTHROID, LEVOTHROID) 75 MCG tablet Take 1 tablet by mouth (32mcg total) daily 90 tablet 0  . losartan (COZAAR) 50 MG tablet Take 1 tablet (50 mg total) by mouth daily. 90 tablet 0  . metolazone (ZAROXOLYN) 2.5 MG tablet TAKE 1 TABLET (2.5 MG TOTAL) BY MOUTH DAILY. 30 MINUTES BEFORE FUROSEMIDE DOSE 90 tablet 1  . metoprolol succinate (TOPROL-XL) 25 MG 24 hr tablet Take 1 tablet by mouth daily 90 tablet 0  . mirtazapine (REMERON) 7.5 MG tablet Take 1 tablet (7.5 mg total) by mouth at bedtime. 90 tablet 0  . nitroGLYCERIN (NITROSTAT) 0.4 MG SL tablet Place 1 tablet (0.4 mg total) under the tongue every 5 (five) minutes as needed for chest pain. Maximum 3 doses 50 tablet 3  .  omeprazole (PRILOSEC) 20 MG capsule Take 1 capsule by mouth twice a day before a meal 180 capsule 0  . ondansetron (ZOFRAN ODT) 4 MG disintegrating tablet Take 1 tablet (4 mg total) by mouth every 8 (eight) hours as needed for nausea or vomiting. 20 tablet 0  . polyethylene glycol (MIRALAX / GLYCOLAX) packet Take 17 g by mouth daily.    . Probiotic Product (PROBIOTIC PO) Take 1 tablet by mouth daily.    . sertraline (ZOLOFT) 50 MG tablet TAKE 1 TABLET BY MOUTH EVERY DAY 90 tablet 2  . sodium fluoride (DENTA 5000 PLUS) 1.1 %  CREA dental cream Take 1 application by mouth daily.    . tizanidine (ZANAFLEX) 2 MG capsule Take 1 capsule (2 mg total) by mouth 3 (three) times daily. 90 capsule 0  . traMADol (ULTRAM) 50 MG tablet Take 1 tablet (50 mg total) by mouth every 8 (eight) hours as needed. 60 tablet 2  . triamcinolone ointment (KENALOG) 0.1 % Apply 1 application topically daily.  0   No current facility-administered medications for this visit.     Allergies:   Azathioprine; Hydroxychloroquine; Mycophenolate mofetil; Amoxicillin; Codeine; Naprosyn [naproxen]; Orudis [ketoprofen]; and Sulfathiazole    Social History:  The patient  reports that she has never smoked. She has never used smokeless tobacco. She reports that she does not drink alcohol or use drugs.   Family History:  The patient's family history includes COPD in her father; Cancer in her father; Cancer (age of onset: 31) in her brother; Heart attack (age of onset: 66) in her brother; Heart disease in her brother and mother; Kidney disease in her mother and sister.    ROS:  Please see the history of present illness.   Otherwise, review of systems are positive for none.  All other systems are reviewed and negative.    PHYSICAL EXAM: VS:  BP 124/66 (BP Location: Right Arm, Patient Position: Sitting, Cuff Size: Normal)   Pulse 77   Ht 5\' 4"  (1.626 m)   Wt 133 lb (60.3 kg)   BMI 22.83 kg/m  , BMI Body mass index is 22.83 kg/m. GEN:  Well nourished, well developed, in no acute distress  HEENT: normal  Neck: no JVD, carotid bruits, or masses Cardiac: RRR; no murmurs, rubs, or gallops,no edema  Respiratory:  clear to auscultation bilaterally, normal work of breathing GI: soft, nontender, nondistended, + BS MS: no deformity or atrophy  Skin: warm and dry, no rash Neuro:  Strength and sensation are intact Psych: euthymic mood, full affect   EKG:  EKG is ordered today. The ekg ordered today demonstrates sinus rhythm with first-degree AV block.  Old septal infarct.   Recent Labs: 10/04/2018: TSH 0.95 11/07/2018: ALT 10; BUN 12; Creatinine, Ser 0.54; Hemoglobin 11.1; Hemoglobin 11.2; Platelets 141; Platelets 152; Potassium 4.9; Sodium 132    Lipid Panel    Component Value Date/Time   CHOL 137 11/08/2018 0426   CHOL 157 05/31/2012 0338   TRIG 85 11/08/2018 0426   TRIG 57 05/31/2012 0338   HDL 30 (L) 11/08/2018 0426   HDL 38 (L) 05/31/2012 0338   CHOLHDL 4.6 11/08/2018 0426   VLDL 17 11/08/2018 0426   VLDL 11 05/31/2012 0338   LDLCALC 90 11/08/2018 0426   LDLCALC 108 (H) 05/31/2012 0338   LDLDIRECT 117.6 12/27/2012 1630      Wt Readings from Last 3 Encounters:  11/11/18 133 lb (60.3 kg)  11/11/18 132 lb 6.4 oz (60.1 kg)  11/07/18 130 lb 4.8 oz (59.1 kg)       ASSESSMENT AND PLAN:  1.  Atypical chest pain likely due to GERD: She has not had any recent symptoms in the last few months and she denies any exertional symptoms.  I reviewed previous cardiac catheterization with her that was done in 2013 which showed minimal coronary artery disease.  Due to that and given no recurrent symptoms and relatively benign EKG, I do not recommend further cardiac work-up at the present time. If he develops recurrent chest pain, I recommend a Lexiscan Myoview.  2.  Recent stroke: Treated by neurology.  No evidence of atrial fibrillation.  Carotid Doppler showed nonobstructive disease and echo showed normal ejection  fraction.  3.  Essential hypertension: Blood pressure is now controlled after increasing losartan.  4.  Hyperlipidemia: Currently on atorvastatin 20 mg daily.    Disposition:   FU with me PRN  I, Jesus Reyes am acting as a Education administrator for Kathlyn Sacramento, M.D.  I have reviewed the above documentation for accuracy and completeness, and I agree with the above.    Signed, Kathlyn Sacramento, MD 11/11/18 [place], [place]

## 2018-11-10 DIAGNOSIS — I639 Cerebral infarction, unspecified: Secondary | ICD-10-CM | POA: Diagnosis not present

## 2018-11-10 NOTE — Telephone Encounter (Signed)
Opened in error. See previous phone note. 

## 2018-11-11 ENCOUNTER — Ambulatory Visit (INDEPENDENT_AMBULATORY_CARE_PROVIDER_SITE_OTHER): Payer: PPO | Admitting: Internal Medicine

## 2018-11-11 ENCOUNTER — Ambulatory Visit: Payer: PPO | Admitting: Cardiovascular Disease

## 2018-11-11 ENCOUNTER — Encounter: Payer: Self-pay | Admitting: Cardiovascular Disease

## 2018-11-11 ENCOUNTER — Encounter: Payer: Self-pay | Admitting: Internal Medicine

## 2018-11-11 VITALS — BP 124/66 | HR 77 | Ht 64.0 in | Wt 133.0 lb

## 2018-11-11 VITALS — BP 140/80 | HR 73 | Temp 98.4°F | Resp 16 | Ht 64.0 in | Wt 132.4 lb

## 2018-11-11 DIAGNOSIS — R2689 Other abnormalities of gait and mobility: Secondary | ICD-10-CM | POA: Diagnosis not present

## 2018-11-11 DIAGNOSIS — I69398 Other sequelae of cerebral infarction: Secondary | ICD-10-CM

## 2018-11-11 DIAGNOSIS — I10 Essential (primary) hypertension: Secondary | ICD-10-CM

## 2018-11-11 DIAGNOSIS — I69359 Hemiplegia and hemiparesis following cerebral infarction affecting unspecified side: Secondary | ICD-10-CM

## 2018-11-11 DIAGNOSIS — E782 Mixed hyperlipidemia: Secondary | ICD-10-CM

## 2018-11-11 DIAGNOSIS — R079 Chest pain, unspecified: Secondary | ICD-10-CM

## 2018-11-11 DIAGNOSIS — I6930 Unspecified sequelae of cerebral infarction: Secondary | ICD-10-CM

## 2018-11-11 DIAGNOSIS — Z09 Encounter for follow-up examination after completed treatment for conditions other than malignant neoplasm: Secondary | ICD-10-CM

## 2018-11-11 DIAGNOSIS — D72819 Decreased white blood cell count, unspecified: Secondary | ICD-10-CM | POA: Insufficient documentation

## 2018-11-11 DIAGNOSIS — F331 Major depressive disorder, recurrent, moderate: Secondary | ICD-10-CM | POA: Diagnosis not present

## 2018-11-11 MED ORDER — LOSARTAN POTASSIUM 50 MG PO TABS: 50.0000 mg | ORAL_TABLET | Freq: Every day | ORAL | 0 refills | Status: DC

## 2018-11-11 NOTE — Progress Notes (Signed)
Subjective:  Patient ID: Kim Santiago, female    DOB: Aug 20, 1935  Age: 84 y.o. MRN: [id number]  CC: The primary encounter diagnosis was Impaired balance as late effect of cerebrovascular accident. Diagnoses of Mixed hyperlipidemia, Hemiplegia affecting dominant side, post-stroke [facility]), Essential hypertension, Major depressive disorder, recurrent episode, moderate ([place]), Hospital discharge follow-up, and History of CVA with residual deficit were also pertinent to this visit.  HPI Kim Santiago presents for hospital follow up.  Patient was admitted on  March 2    To   [facility].  The   admitting diagnosis was a Right thalamic nonhemorrhagic  infarct .  Presented with numbness and weakness of left arm/left leg .  Symptoms resolved.   She was discharged home  on  March 3  With a forearm crutch for mobility and outpatient PT/OT ordered.    HPI:  She was treated at urgent Care (see ER note) on Saturday Feb 29 for uncontrolled  hypertension . Losartan dose was increased to 50 mg . No mention by MD of left sided weakness in MD note, no neurologic exam documented   Daughter called the office on March 2 with history of Left sided weakness since Friday (the day prior to being seen in the ER ), headache  And increased BP  and told to go to ER for urgent imaging. She was admitted and CVA workup was done.  ECHO was done without a bubble study: normal EF,  No WMA. Carotid ultrasounds noted < 50% stenosis bilaterally.  The following medications were newly prescribed:  Atorvastatin added at discharge, and plavix added  By NP Neurology for a period of 3 months  Needs to resume zoloft  At lower dose. Currently nauseated,  Reduce dose to 25 mg now and continue mirtazipine   Patient is now living with her daughter,  Who is with her today She is requesting a handicapped permit as she has deficits from the CVA which make walking long distances tiring   All labs , imaging studies and progress notes from  admission were reviewed with patient and daughter  today   Outpatient Medications Prior to Visit  Medication Sig Dispense Refill  . aspirin EC 81 MG EC tablet Take 1 tablet (81 mg total) by mouth daily. 180 tablet 0  . atorvastatin (LIPITOR) 20 MG tablet Take 1 tablet (20 mg total) by mouth daily at 6 PM. 90 tablet 0  . Calcium Carbonate-Vitamin D (CALCIUM + D) 600-200 MG-UNIT TABS Take 2 tablets by mouth 2 (two) times daily.     . cholecalciferol (VITAMIN D) 1000 UNITS tablet Take 2,000 Units by mouth daily.     Marland Kitchen CRANBERRY PO Take 1 capsule by mouth 2 (two) times daily.    . furosemide (LASIX) 20 MG tablet 1 tablet every other day as needed 30 tablet 3  . letrozole (FEMARA) 2.5 MG tablet Take 1 tablet (2.5 mg total) by mouth daily. 90 tablet 3  . levothyroxine (SYNTHROID, LEVOTHROID) 75 MCG tablet Take 1 tablet by mouth (73mcg total) daily 90 tablet 0  . metolazone (ZAROXOLYN) 2.5 MG tablet TAKE 1 TABLET (2.5 MG TOTAL) BY MOUTH DAILY. 30 MINUTES BEFORE FUROSEMIDE DOSE 90 tablet 1  . metoprolol succinate (TOPROL-XL) 25 MG 24 hr tablet Take 1 tablet by mouth daily 90 tablet 0  . mirtazapine (REMERON) 7.5 MG tablet Take 1 tablet (7.5 mg total) by mouth at bedtime. 90 tablet 0  . nitroGLYCERIN (NITROSTAT) 0.4 MG SL tablet  Place 1 tablet (0.4 mg total) under the tongue every 5 (five) minutes as needed for chest pain. Maximum 3 doses 50 tablet 3  . omeprazole (PRILOSEC) 20 MG capsule Take 1 capsule by mouth twice a day before a meal 180 capsule 0  . ondansetron (ZOFRAN ODT) 4 MG disintegrating tablet Take 1 tablet (4 mg total) by mouth every 8 (eight) hours as needed for nausea or vomiting. 20 tablet 0  . polyethylene glycol (MIRALAX / GLYCOLAX) packet Take 17 g by mouth daily.    . Probiotic Product (PROBIOTIC PO) Take 1 tablet by mouth daily.    . sertraline (ZOLOFT) 50 MG tablet TAKE 1 TABLET BY MOUTH EVERY DAY 90 tablet 2  . sodium fluoride (DENTA 5000 PLUS) 1.1 % CREA dental cream Take 1  application by mouth daily.    . tizanidine (ZANAFLEX) 2 MG capsule Take 1 capsule (2 mg total) by mouth 3 (three) times daily. 90 capsule 0  . traMADol (ULTRAM) 50 MG tablet Take 1 tablet (50 mg total) by mouth every 8 (eight) hours as needed. 60 tablet 2  . triamcinolone ointment (KENALOG) 0.1 % Apply 1 application topically daily.  0  . losartan (COZAAR) 25 MG tablet Take 2 tablets (50 mg total) by mouth daily. 30 tablet 0  . ciprofloxacin (CIPRO) 500 MG tablet Take 1 tablet (500 mg total) by mouth 2 (two) times daily. (Patient not taking: Reported on 11/07/2018) 14 tablet 0   No facility-administered medications prior to visit.     Review of Systems;  Patient denies headache, fevers, malaise, unintentional weight loss, skin rash, eye pain, sinus congestion and sinus pain, sore throat, dysphagia,  hemoptysis , cough, dyspnea, wheezing, chest pain, palpitations, orthopnea, edema, abdominal pain, nausea, melena, diarrhea, constipation, flank pain, dysuria, hematuria, urinary  Frequency, nocturia, numbness, tingling, seizures,  Focal weakness, Loss of consciousness,  Tremor, insomnia, depression, anxiety, and suicidal ideation.      Objective:  BP 140/80 (BP Location: Left Arm, Patient Position: Sitting, Cuff Size: Normal)   Pulse 73   Temp 98.4 F (36.9 C) (Oral)   Resp 16   Ht 5\' 4"  (1.626 m)   Wt 132 lb 6.4 oz (60.1 kg)   SpO2 99%   BMI 22.73 kg/m   BP Readings from Last 3 Encounters:  11/11/18 124/66  11/11/18 140/80  11/08/18 (!) 111/51    Wt Readings from Last 3 Encounters:  11/11/18 133 lb (60.3 kg)  11/11/18 132 lb 6.4 oz (60.1 kg)  11/07/18 130 lb 4.8 oz (59.1 kg)    General appearance: alert, cooperative and appears stated age Ears: normal TM's and external ear canals both ears Throat: lips, mucosa, and tongue normal; teeth and gums normal Neck: no adenopathy, no carotid bruit, supple, symmetrical, trachea midline and thyroid not enlarged, symmetric, no  tenderness/mass/nodules Back: symmetric, no curvature. ROM normal. No CVA tenderness. Lungs: clear to auscultation bilaterally Heart: regular rate and rhythm, S1, S2 normal, no murmur, click, rub or gallop Abdomen: soft, non-tender; bowel sounds normal; no masses,  no organomegaly Pulses: 2+ and symmetric Skin: Skin color, texture, turgor normal. No rashes or lesions Lymph nodes: Cervical, supraclavicular, and axillary nodes normal.  Lab Results  Component Value Date   HGBA1C 4.9 11/08/2018    Lab Results  Component Value Date   CREATININE 0.54 11/07/2018   CREATININE 0.68 10/21/2018   CREATININE 0.66 10/04/2018    Lab Results  Component Value Date   WBC 3.4 (L) 11/07/2018   WBC  3.3 (L) 11/07/2018   HGB 11.1 (L) 11/07/2018   HGB 11.2 (L) 11/07/2018   HCT 32.4 (L) 11/07/2018   HCT 32.9 (L) 11/07/2018   PLT 141 (L) 11/07/2018   PLT 152 11/07/2018   GLUCOSE 102 (H) 11/07/2018   CHOL 137 11/08/2018   TRIG 85 11/08/2018   HDL 30 (L) 11/08/2018   LDLDIRECT 117.6 12/27/2012   LDLCALC 90 11/08/2018   ALT 10 11/07/2018   AST 25 11/07/2018   NA 132 (L) 11/07/2018   K 4.9 11/07/2018   CL 99 11/07/2018   CREATININE 0.54 11/07/2018   BUN 12 11/07/2018   CO2 26 11/07/2018   TSH 0.95 10/04/2018   INR 0.9 11/07/2018   HGBA1C 4.9 11/08/2018    US Carotid Bilateral  Result Date: 11/08/2018 CLINICAL DATA:  Stroke-like symptoms EXAM: BILATERAL CAROTID DUPLEX ULTRASOUND TECHNIQUE: Pearline Cables scale imaging, color Doppler and duplex ultrasound were performed of bilateral carotid and vertebral arteries in the neck. COMPARISON:  None. FINDINGS: Criteria: Quantification of carotid stenosis is based on velocity parameters that correlate the residual internal carotid diameter with NASCET-based stenosis levels, using the diameter of the distal internal carotid lumen as the denominator for stenosis measurement. The following velocity measurements were obtained: RIGHT ICA: 104 cm/sec CCA: 81 cm/sec  SYSTOLIC ICA/CCA RATIO:  1.3 ECA: 150 cm/sec LEFT ICA: 76 cm/sec CCA: 98 cm/sec SYSTOLIC ICA/CCA RATIO:  0.8 ECA: 111 cm/sec RIGHT CAROTID ARTERY: Little if any plaque in the bulb. Low resistance internal carotid Doppler pattern is preserved. RIGHT VERTEBRAL ARTERY:  Antegrade. LEFT CAROTID ARTERY: Mild irregular mixed plaque in the bulb. Low resistance internal carotid Doppler pattern is preserved. LEFT VERTEBRAL ARTERY:  Antegrade. IMPRESSION: Less than 50% stenosis in the right and left internal carotid arteries. Electronically Signed   By: Marybelle Killings M.D.   On: 11/08/2018 14:26    Assessment & Plan:   Problem List Items Addressed This Visit    Essential hypertension    Improving controll with higher losartan dose of 50 mg daily.  No changes today      Relevant Medications   losartan (COZAAR) 50 MG tablet   Hyperlipidemia    peeviously untreated du to age and LDL < 100.  Now s/p CVA taking atorvastatin .   Lab Results  Component Value Date   CHOL 137 11/08/2018   HDL 30 (L) 11/08/2018   LDLCALC 90 11/08/2018   LDLDIRECT 117.6 12/27/2012   TRIG 85 11/08/2018   CHOLHDL 4.6 11/08/2018         Relevant Medications   losartan (COZAAR) 50 MG tablet   Major depressive disorder, recurrent episode, moderate (HCC)    She is sleeping better and feeling better on current dose of Remeron . She has resumed Zoloft but at the 50 mg dose which is causing nausea.  reduce dose to 25 mg daily       Hospital discharge follow-up    Patient is stable post discharge and has no new issues or questions about her discharge plans, All labs , imaging studies and progress notes from admission were reviewed with patinet today       Impaired balance as late effect of cerebrovascular accident - Primary    PT has been ordered and she is using a forearm crutch for mobility.  Handicapped application signed for parking permit       Relevant Orders   DME Other see comment   DME Other see comment   History  of CVA with residual  deficit    Right thalamic ,  Nonhemorrhagic, presenning with left sided weakness and hypertensive crisis. Now taking statin asa and plavix        Other Visit Diagnoses    Hemiplegia affecting dominant side, post-stroke ([place])       Relevant Orders   DME Other see comment   DME Other see comment      I have discontinued Dailynn R. Scarber's ciprofloxacin. I have also changed her losartan. Additionally, I am having her maintain her cholecalciferol, Calcium Carbonate-Vitamin D, polyethylene glycol, tizanidine, traMADol, furosemide, ondansetron, metolazone, triamcinolone ointment, Probiotic Product (PROBIOTIC PO), nitroGLYCERIN, sertraline, metoprolol succinate, omeprazole, letrozole, levothyroxine, CRANBERRY PO, mirtazapine, sodium fluoride, aspirin, and atorvastatin.  Meds ordered this encounter  Medications  . losartan (COZAAR) 50 MG tablet    Sig: Take 1 tablet (50 mg total) by mouth daily.    Dispense:  90 tablet    Refill:  0    Medications Discontinued During This Encounter  Medication Reason  . ciprofloxacin (CIPRO) 500 MG tablet Completed Course  . losartan (COZAAR) 25 MG tablet     Follow-up: No follow-ups on file.   [name], MD

## 2018-11-11 NOTE — Patient Instructions (Signed)
Medication Instructions:  No changes If you need a refill on your cardiac medications before your next appointment, please call your pharmacy.   Lab work: None ordered  Testing/Procedures: None ordered  Follow-Up: As needed with Dr. Fletcher Anon

## 2018-11-11 NOTE — Assessment & Plan Note (Addendum)
peeviously untreated du to age and LDL < 100.  Now s/p CVA taking atorvastatin .   Lab Results  Component Value Date   CHOL 137 11/08/2018   HDL 30 (L) 11/08/2018   LDLCALC 90 11/08/2018   LDLDIRECT 117.6 12/27/2012   TRIG 85 11/08/2018   CHOLHDL 4.6 11/08/2018

## 2018-11-11 NOTE — Patient Instructions (Addendum)
I agree with reducing the dose of  zoloft to  25 mg daily. You can  Increase the dose to 50 mg daily  Once the nausea resolves. MAKE SURE YOU TAKE IT AFTER A FULL MEAL IN THE EVENING   Do not take tizanidine except for muscle spasms  I agree with taking 50  Mg losartan  Daily but TAKE IT AT BEDTIME.

## 2018-11-13 DIAGNOSIS — R2689 Other abnormalities of gait and mobility: Secondary | ICD-10-CM | POA: Insufficient documentation

## 2018-11-13 DIAGNOSIS — I6930 Unspecified sequelae of cerebral infarction: Secondary | ICD-10-CM | POA: Insufficient documentation

## 2018-11-13 DIAGNOSIS — I69398 Other sequelae of cerebral infarction: Secondary | ICD-10-CM | POA: Insufficient documentation

## 2018-11-13 NOTE — Assessment & Plan Note (Addendum)
Right thalamic ,  Nonhemorrhagic, presenning with left sided weakness and hypertensive crisis. Now taking statin asa and plavix  

## 2018-11-13 NOTE — Assessment & Plan Note (Signed)
Patient is stable post discharge and has no new issues or questions about her discharge plans, All labs , imaging studies and progress notes from admission were reviewed with patinet today

## 2018-11-13 NOTE — Assessment & Plan Note (Signed)
PT has been ordered and she is using a forearm crutch for mobility.  Handicapped application signed for parking permit

## 2018-11-13 NOTE — Assessment & Plan Note (Signed)
Improving controll with higher losartan dose of 50 mg daily.  No changes today

## 2018-11-13 NOTE — Assessment & Plan Note (Signed)
She is sleeping better and feeling better on current dose of Remeron . She has resumed Zoloft but at the 50 mg dose which is causing nausea.  reduce dose to 25 mg daily

## 2018-11-15 ENCOUNTER — Telehealth: Payer: Self-pay | Admitting: Internal Medicine

## 2018-11-15 NOTE — Telephone Encounter (Signed)
Left a detailed message letting Kim Santiago at [facility] know that the pt is supposed to be taking Losartan 50mg  according to Dr. Lupita Dawn last office note with pt.

## 2018-11-15 NOTE — Telephone Encounter (Signed)
Copied from [place] [id number]. Topic: Quick Communication - Rx Refill/Question >> Nov 15, 2018 10:13 AM Rayann Heman wrote: Medication:losartan (COZAAR) 50 MG tablet [[id number]]  Marcello Moores calling from [employer] called and stated that he noticed where the patient had recently refilled 25mg  locally and wanted to confirm that this was the right mg. Marcello Moores states that we can leave a detailed message but states that we have to say first and last name. Please advise [phone]

## 2018-11-21 ENCOUNTER — Telehealth: Payer: Self-pay

## 2018-11-21 NOTE — Telephone Encounter (Signed)
Copied from [place] [id number]. Topic: General - Inquiry >> Nov 21, 2018  1:11 PM Vernona Rieger wrote: Reason for CRM: Patient's son in law, Corene Cornea would like to know does she still need to come on Wednesday, March 18th since Dr Derrel Nip seen her 3/6 for her hospital fu. Corene Cornea said she is feeling better and he is scared to bring her back if she is just going to catch something else.

## 2018-11-21 NOTE — Telephone Encounter (Signed)
HE CAN CANCEL THE MARCH 18TH APPT

## 2018-11-21 NOTE — Telephone Encounter (Signed)
Spoke with pt to let her know that Dr. Derrel Nip is okay for her to cancel her appt on March 18th. The pt gave a verbal understanding and stated that she is improving day by day. Appt has been canceled.

## 2018-11-23 ENCOUNTER — Other Ambulatory Visit: Payer: PPO

## 2018-11-23 ENCOUNTER — Inpatient Hospital Stay: Payer: PPO | Admitting: Internal Medicine

## 2018-11-29 ENCOUNTER — Ambulatory Visit: Payer: PPO

## 2018-11-29 ENCOUNTER — Other Ambulatory Visit: Payer: Self-pay

## 2018-11-29 MED ORDER — LOSARTAN POTASSIUM 50 MG PO TABS: 50.0000 mg | ORAL_TABLET | Freq: Every day | ORAL | 0 refills | Status: DC

## 2018-11-30 ENCOUNTER — Ambulatory Visit: Payer: PPO | Admitting: Internal Medicine

## 2018-12-01 ENCOUNTER — Telehealth: Payer: Self-pay | Admitting: Internal Medicine

## 2018-12-01 NOTE — Telephone Encounter (Signed)
Copied from [place] [id number]. Topic: Quick Communication - See Telephone Encounter >> Dec 01, 2018  3:59 PM Rayann Heman wrote: CRM for notification. See Telephone encounter for: 12/01/18. Sandy NP  with [facility] called and left a voicemail and wanted to let us know that she will be following the patient and making sure she is taking her mediation. Lovey Newcomer states they go In the home for care. She states that she will not be taking over her care. She would like [place] to know that we can call her with any questions.

## 2018-12-06 ENCOUNTER — Ambulatory Visit: Payer: PPO | Admitting: General Surgery

## 2018-12-16 ENCOUNTER — Other Ambulatory Visit: Payer: PPO

## 2018-12-16 ENCOUNTER — Inpatient Hospital Stay: Payer: PPO | Attending: Hematology and Oncology | Admitting: Hematology and Oncology

## 2018-12-16 ENCOUNTER — Ambulatory Visit: Payer: Self-pay | Admitting: *Deleted

## 2018-12-16 DIAGNOSIS — R590 Localized enlarged lymph nodes: Secondary | ICD-10-CM | POA: Diagnosis not present

## 2018-12-16 DIAGNOSIS — Z170 Estrogen receptor positive status [ER+]: Secondary | ICD-10-CM | POA: Diagnosis not present

## 2018-12-16 DIAGNOSIS — C50912 Malignant neoplasm of unspecified site of left female breast: Secondary | ICD-10-CM | POA: Diagnosis not present

## 2018-12-16 DIAGNOSIS — D72819 Decreased white blood cell count, unspecified: Secondary | ICD-10-CM

## 2018-12-16 DIAGNOSIS — Z79811 Long term (current) use of aromatase inhibitors: Secondary | ICD-10-CM

## 2018-12-16 DIAGNOSIS — M85851 Other specified disorders of bone density and structure, right thigh: Secondary | ICD-10-CM | POA: Diagnosis not present

## 2018-12-16 DIAGNOSIS — Z79899 Other long term (current) drug therapy: Secondary | ICD-10-CM

## 2018-12-16 NOTE — Telephone Encounter (Signed)
Put on four new medicaitons. In the hospital due to a stroke about a month ago. One of the medications was Losartan and BP was really high before the stroke. i've passed out twice aabout a week and half ago and last night. Pt states that she was on Losartan previously at 25mg  and went to urgent care 50 mg, an hout to an hour half of taking Losartan and stat

## 2018-12-16 NOTE — Progress Notes (Signed)
Confirmed name, DOB, and address. Patient reports having 2 episodes of syncope with last one occurring last night. States she feels this could be related to her losartan but is unsure.

## 2018-12-16 NOTE — Progress Notes (Signed)
[facility]  [address] [place], [place] [address] Phone: [phone]  Fax: [phone]   Telephone Office Visit:  12/16/2018  Referring physician: [name], MD  I connected with Kim Santiago on 12/16/18 at 4:09 PM EDT by phone and verified that I was speaking with the correct person using 2 identifiers.  The patient was at her daughter's house.  I discussed the limitations, risk, security and privacy concerns of performing an evaluation and management service by telephone and the availability of in person appointments.  I also discussed with the patient that there may be a patient responsible charge related to this service.  The patient expressed understanding and agreed to proceed.   Chief Complaint: Kim Santiago is a 84 y.o. female with stage IIB left breast cancer who is evaluated for 4 month assessment on Femara.  HPI:  The patient was last seen in the medical oncology clinic on 08/24/2018.  At that time,  she felt "pretty good".  She had active cutaneous lupus.  Breast exam was normal.  CA27.29 was normal.  TSH and copper were normal.  Chest CT on 09/26/2018 revealed a stable exam.  There was no change in the previous described mediastinal lymph nodes.  There were stable tiny right lung nodules consistent with benign etiology  She was admitted to [facility] from 11/07/2018 - 11/08/2018 with a CVA.  She presented with left sided focal numbness and weakness.  Head MRI and MRA without contrast revealed an acute versus subacute faint right almost nonhemorrhagic infarct.  There was moderate to severe chronic small vessel ischemic changes.  There was no large vessel flow-limiting stenosis. Bilateral carotid ultrasound revealed < 50% stenosis in the right and left internal carotid arteries  Symptoms had resolved prior to presentation.  Plan was to continue her aspirin and statin therapy.  Etiology was felt due to a hypertensive CVA.  Her  medications were adjusted.  States that she is on "for new meds".  She is concerned about her increased dose of losartan, which is currently 50 mg a day.  Her old dose of losartan was 25 mg.   She describes 2 syncopal events in the recent past.  She is unaware of her blood pressure.  She denied any chest pain or shortness of breath.  She denied any palpitations.  She denies any breast concerns.  She notes that her cutaneous lupus is "ugly".  She is using ointments.  She states that her mammogram was "pushed off" secondary to COVID-19.   Past Medical History:  Diagnosis Date   Arthritis    Breast cancer ([facility]) 2017   left mastectomy done 11/2015   Breast cancer in female [facility]) 11/18/2015   Left: 3.9 cm tumor, T2, 1/2 sentinel nodes positive for macro metastatic disease, N1, 3 negative nodes in the axillary tail, ER+,PR+, Her 2 neu, low Mammoprint score   Cancer (HCC)    thyroid takes levothyroxine   Chronic kidney disease    UTI   Genetic screening March 2017.   Mammoprint of left breast cancer: Low risk for recurrence.    Hypertension    hypothyroidism    secondary to thyroidectomy for thyroid ca   Hypothyroidism    Lupus (HCC)    subcutaneous   Menopause 40s   natural, hot flashes and mood lability now gone, off prempro 7 months   Myocardial infarction [facility]) 2013   Osteoporosis    Osteopenia   Rosacea  Sjoegren syndrome    Stress-induced cardiomyopathy September of 2013   EF 35%. Peak troponin was 1.8.    Past Surgical History:  Procedure Laterality Date   BACK SURGERY     BREAST BIOPSY Left 10/30/15   positive, done in Dr. Dwyane Luo office   CARDIAC CATHETERIZATION  05/2012   [place]. No significant CAD. Ejection fraction of 35% due to stress-induced cardiomyopathy.   CHOLECYSTECTOMY     COLONOSCOPY     DILATION AND CURETTAGE OF UTERUS     KYPHOSIS SURGERY  Feb 2008   L1, Dr. Mauri Pole   LUMBAR DISC SURGERY     L4-L5   MASTECTOMY Left 11/18/2015    positive   SENTINEL NODE BIOPSY Left 11/18/2015   Procedure: SENTINEL NODE BIOPSY;  Surgeon: Robert Bellow, MD;  Location: [facility] ORS;  Service: General;  Laterality: Left;   SHOULDER ARTHROSCOPY  2004   Left, Dr. Jefm Bryant   SIMPLE MASTECTOMY WITH AXILLARY SENTINEL NODE BIOPSY Left 11/18/2015   Procedure: SIMPLE MASTECTOMY;  Surgeon: Robert Bellow, MD;  Location: [facility] ORS;  Service: General;  Laterality: Left;   SPINE SURGERY     L4-5 diskectomy   THYROIDECTOMY     Thyroid Cancer   TONSILLECTOMY     TUBAL LIGATION      Family History  Problem Relation Age of Onset   COPD Father    Cancer Father        esophageal   Kidney disease Mother    Heart disease Mother    Kidney disease Sister    Cancer Brother 63       colon cancer (both brothers)   Heart attack Brother 82   Heart disease Brother    Breast cancer Neg Hx     Social History:  reports that she has never smoked. She has never used smokeless tobacco. She reports that she does not drink alcohol or use drugs.  She is from [place], [place].  She was married for 22 years.  Her husband died 31 months ago.  She lives alone in [place].  She has an adopted daughter and 2 grandchildren.  Her brother died of a MI following a cholecystectomy in 12/2015.  The patient is living at her daughter's house since her CVA in 11/2018.  Participants in the patient's visit and their role in the encounter included the patient and Waymon Budge, RN, today.  The intake visit was provided by Waymon Budge, RN.    Allergies:  Allergies  Allergen Reactions   Azathioprine Nausea And Vomiting    Severe vomiting   Hydroxychloroquine Hives and Nausea And Vomiting   Mycophenolate Mofetil Nausea Only   Amoxicillin Rash    Has patient had a PCN reaction causing immediate rash, facial/tongue/throat swelling, SOB or lightheadedness with hypotension: Unknown Has patient had a PCN reaction causing severe rash involving mucus  membranes or skin necrosis: Unknown Has patient had a PCN reaction that required hospitalization: Unknown Has patient had a PCN reaction occurring within the last 10 years: Unknown If all of the above answers are "NO", then may proceed with Cephalosporin use. Tolerates ceftriaxone and Ancef   Codeine Nausea And Vomiting   Naprosyn [Naproxen] Swelling   Orudis [Ketoprofen] Hives   Sulfathiazole Rash    Current Medications: Current Outpatient Medications  Medication Sig Dispense Refill   atorvastatin (LIPITOR) 20 MG tablet Take 1 tablet (20 mg total) by mouth daily at 6 PM. 90 tablet 0   Calcium Carbonate-Vitamin D (CALCIUM + D) 600-200  MG-UNIT TABS Take 2 tablets by mouth daily.      cholecalciferol (VITAMIN D) 1000 UNITS tablet Take 2,000 Units by mouth daily.      clopidogrel (PLAVIX) 75 MG tablet Take 75 mg by mouth daily.     CRANBERRY PO Take 1 capsule by mouth 2 (two) times daily.     letrozole (FEMARA) 2.5 MG tablet Take 1 tablet (2.5 mg total) by mouth daily. 90 tablet 3   levothyroxine (SYNTHROID, LEVOTHROID) 75 MCG tablet Take 1 tablet by mouth (22mg total) daily 90 tablet 0   losartan (COZAAR) 50 MG tablet Take 1 tablet (50 mg total) by mouth daily. 90 tablet 0   metoprolol succinate (TOPROL-XL) 25 MG 24 hr tablet Take 1 tablet by mouth daily 90 tablet 0   mirtazapine (REMERON) 7.5 MG tablet Take 1 tablet (7.5 mg total) by mouth at bedtime. 90 tablet 0   omeprazole (PRILOSEC) 20 MG capsule Take 1 capsule by mouth twice a day before a meal 180 capsule 0   ondansetron (ZOFRAN ODT) 4 MG disintegrating tablet Take 1 tablet (4 mg total) by mouth every 8 (eight) hours as needed for nausea or vomiting. 20 tablet 0   polyethylene glycol (MIRALAX / GLYCOLAX) packet Take 17 g by mouth daily.     Probiotic Product (PROBIOTIC PO) Take 1 tablet by mouth daily.     sertraline (ZOLOFT) 50 MG tablet TAKE 1 TABLET BY MOUTH EVERY DAY (Patient taking differently: Take 25 mg by  mouth at bedtime. ) 90 tablet 2   sodium fluoride (DENTA 5000 PLUS) 1.1 % CREA dental cream Take 1 application by mouth daily.     tizanidine (ZANAFLEX) 2 MG capsule Take 1 capsule (2 mg total) by mouth 3 (three) times daily. (Patient taking differently: Take 2 mg by mouth 3 (three) times daily as needed. ) 90 capsule 0   traMADol (ULTRAM) 50 MG tablet Take 1 tablet (50 mg total) by mouth every 8 (eight) hours as needed. (Patient taking differently: Take 50 mg by mouth every 6 (six) hours as needed. ) 60 tablet 2   triamcinolone ointment (KENALOG) 0.1 % Apply 1 application topically daily.  0   aspirin EC 81 MG EC tablet Take 1 tablet (81 mg total) by mouth daily. (Patient not taking: Reported on 12/16/2018) 180 tablet 0   furosemide (LASIX) 20 MG tablet 1 tablet every other day as needed (Patient not taking: Reported on 12/16/2018) 30 tablet 3   metolazone (ZAROXOLYN) 2.5 MG tablet TAKE 1 TABLET (2.5 MG TOTAL) BY MOUTH DAILY. 3[place](Patient not taking: Reported on 12/16/2018) 90 tablet 1   nitroGLYCERIN (NITROSTAT) 0.4 MG SL tablet Place 1 tablet (0.4 mg total) under the tongue every 5 (five) minutes as needed for chest pain. Maximum 3 doses (Patient not taking: Reported on 12/16/2018) 50 tablet 3   No current facility-administered medications for this visit.     Review of Systems  Constitutional: Positive for weight loss (initial weight loss, improving at her daughter's house). Negative for chills, diaphoresis, fever and malaise/fatigue.       Notes "hard times" secondary to health issues.  HENT: Negative for congestion, ear discharge, ear pain, nosebleeds, sinus pain, sore throat and tinnitus.        Xerostomia due to Sjogren's.  Eyes: Negative for double vision, photophobia and pain.       Dry eyes secondary to Sjogren's.  Respiratory: Negative.  Negative for cough, hemoptysis, sputum production and shortness  of breath.   Cardiovascular: Negative.  Negative  for chest pain, palpitations, orthopnea, leg swelling and PND.  Gastrointestinal: Negative.  Negative for abdominal pain, blood in stool, constipation, diarrhea, heartburn, melena, nausea and vomiting.  Genitourinary: Negative.  Negative for dysuria, frequency, hematuria and urgency.  Musculoskeletal: Positive for joint pain. Negative for back pain, falls, myalgias and neck pain.  Skin: Positive for rash (cutaneous lupus "ugly", using ointments). Negative for itching.  Neurological: Negative for dizziness, tingling, tremors, sensory change, speech change, focal weakness, weakness and headaches.       Interval CVA.  Recent syncopal episodes.  Endo/Heme/Allergies: Does not bruise/bleed easily.       HYPOthyroidism on levothyroxine.  Psychiatric/Behavioral: Negative.  Negative for depression and memory loss. The patient is not nervous/anxious and does not have insomnia.   All other systems reviewed and are negative.  Performance status (ECOG): 1   Labs: No visits with results within 3 Day(s) from this visit.  Latest known visit with results is:  Admission on 11/07/2018, Discharged on 11/08/2018  Component Date Value Ref Range Status   WBC 11/07/2018 3.4* 4.0 - 10.5 K/uL Final   RBC 11/07/2018 3.37* 3.87 - 5.11 MIL/uL Final   Hemoglobin 11/07/2018 11.1* 12.0 - 15.0 g/dL Final   HCT 11/07/2018 32.4* 36.0 - 46.0 % Final   MCV 11/07/2018 96.1  80.0 - 100.0 fL Final   MCH 11/07/2018 32.9  26.0 - 34.0 pg Final   MCHC 11/07/2018 34.3  30.0 - 36.0 g/dL Final   RDW 11/07/2018 14.3  11.5 - 15.5 % Final   Platelets 11/07/2018 141* 150 - 400 K/uL Final   nRBC 11/07/2018 0.0  0.0 - 0.2 % Final   Performed at [facility], [address] [place], [place] [address]   Color, Urine 11/07/2018 STRAW* YELLOW Final   APPearance 11/07/2018 CLEAR* CLEAR Final   Specific Gravity, Urine 11/07/2018 1.005  1.005 - 1.030 Final   pH 11/07/2018 6.0  5.0 - 8.0 Final   Glucose, UA  11/07/2018 NEGATIVE  NEGATIVE mg/dL Final   Hgb urine dipstick 11/07/2018 NEGATIVE  NEGATIVE Final   Bilirubin Urine 11/07/2018 NEGATIVE  NEGATIVE Final   Ketones, ur 11/07/2018 NEGATIVE  NEGATIVE mg/dL Final   Protein, ur 11/07/2018 NEGATIVE  NEGATIVE mg/dL Final   Nitrite 11/07/2018 NEGATIVE  NEGATIVE Final   Leukocytes,Ua 11/07/2018 NEGATIVE  NEGATIVE Final   RBC / HPF 11/07/2018 0-5  0 - 5 RBC/hpf Final   WBC, UA 11/07/2018 0-5  0 - 5 WBC/hpf Final   Bacteria, UA 11/07/2018 NONE SEEN  NONE SEEN Final   Squamous Epithelial / LPF 11/07/2018 NONE SEEN  0 - 5 Final   Performed at [facility], [address] [place], [place] [address]   Prothrombin Time 11/07/2018 12.1  11.4 - 15.2 seconds Final   INR 11/07/2018 0.9  0.8 - 1.2 Final   Comment: (NOTE) INR goal varies based on device and disease states. Performed at [facility], [address] [place], [place] [address]    aPTT 11/07/2018 30  24 - 36 seconds Final   Performed at [facility], [place], [place] [address]   Sodium 11/07/2018 132* 135 - 145 mmol/L Final   Potassium 11/07/2018 4.9  3.5 - 5.1 mmol/L Final   Chloride 11/07/2018 99  98 - 111 mmol/L Final   CO2 11/07/2018 26  22 - 32 mmol/L Final   Glucose, Bld 11/07/2018 102* 70 - 99 mg/dL Final  BUN 11/07/2018 12  8 - 23 mg/dL Final   Creatinine, Ser 11/07/2018 0.54  0.44 - 1.00 mg/dL Final   Calcium 11/07/2018 8.6* 8.9 - 10.3 mg/dL Final   Total Protein 11/07/2018 6.7  6.5 - 8.1 g/dL Final   Albumin 11/07/2018 3.8  3.5 - 5.0 g/dL Final   AST 11/07/2018 25  15 - 41 U/L Final   ALT 11/07/2018 10  0 - 44 U/L Final   Alkaline Phosphatase 11/07/2018 70  38 - 126 U/L Final   Total Bilirubin 11/07/2018 0.6  0.3 - 1.2 mg/dL Final   GFR calc non Af Amer 11/07/2018 >60  >60 mL/min Final   GFR calc Af Amer 11/07/2018 >60  >60 mL/min Final   Anion gap 11/07/2018 7  5 - 15 Final   Performed at [facility], [place], [place] [address]   WBC 11/07/2018 3.3* 4.0 - 10.5 K/uL Final   RBC 11/07/2018 3.39* 3.87 - 5.11 MIL/uL Final   Hemoglobin 11/07/2018 11.2* 12.0 - 15.0 g/dL Final   HCT 11/07/2018 32.9* 36.0 - 46.0 % Final   MCV 11/07/2018 97.1  80.0 - 100.0 fL Final   MCH 11/07/2018 33.0  26.0 - 34.0 pg Final   MCHC 11/07/2018 34.0  30.0 - 36.0 g/dL Final   RDW 11/07/2018 14.6  11.5 - 15.5 % Final   Platelets 11/07/2018 152  150 - 400 K/uL Final   nRBC 11/07/2018 0.0  0.0 - 0.2 % Final   Neutrophils Relative % 11/07/2018 58  % Final   Neutro Abs 11/07/2018 2.0  1.7 - 7.7 K/uL Final   Lymphocytes Relative 11/07/2018 23  % Final   Lymphs Abs 11/07/2018 0.8  0.7 - 4.0 K/uL Final   Monocytes Relative 11/07/2018 11  % Final   Monocytes Absolute 11/07/2018 0.4  0.1 - 1.0 K/uL Final   Eosinophils Relative 11/07/2018 3  % Final   Eosinophils Absolute 11/07/2018 0.1  0.0 - 0.5 K/uL Final   Basophils Relative 11/07/2018 2  % Final   Basophils Absolute 11/07/2018 0.1  0.0 - 0.1 K/uL Final   Immature Granulocytes 11/07/2018 3  % Final   Abs Immature Granulocytes 11/07/2018 0.09* 0.00 - 0.07 K/uL Final   Performed at [facility], [place], [place] [address]   Hgb A1c MFr Bld 11/08/2018 4.9  4.8 - 5.6 % Final   Comment: (NOTE) Pre diabetes:          5.7%-6.4% Diabetes:              >6.4% Glycemic control for   <7.0% adults with diabetes    Mean Plasma Glucose 11/08/2018 93.93  mg/dL Final   Performed at [facility], [place] [address] [place], [place] [address]   Cholesterol 11/08/2018 137  0 - 200 mg/dL Final   Triglycerides 11/08/2018 85  <150 mg/dL Final   HDL 11/08/2018 30* >40 mg/dL Final   Total CHOL/HDL Ratio 11/08/2018 4.6  RATIO Final   VLDL 11/08/2018 17  0 - 40 mg/dL Final   LDL Cholesterol 11/08/2018 90  0 - 99 mg/dL Final   Comment:        Total Cholesterol/HDL:CHD Risk Coronary Heart Disease  Risk Table                     Men   Women  1/2 Average Risk   3.4   3.3  Average Risk  5.0   4.4  2 X Average Risk   9.6   7.1  3 X Average Risk  23.4   11.0        Use the calculated Patient Ratio above and the CHD Risk Table to determine the patient's CHD Risk.        ATP III CLASSIFICATION (LDL):  <100     mg/dL   Optimal  100-129  mg/dL   Near or Above                    Optimal  130-159  mg/dL   Borderline  160-189  mg/dL   High  >190     mg/dL   Very High Performed at [facility], [address] [place], [place] [address]    Weight 11/08/2018 2,084.8  oz Final   Height 11/08/2018 64  in Final   BP 11/08/2018 124/66  mmHg Final   Cholesterol 11/07/2018 143  0 - 200 mg/dL Final   Triglycerides 11/07/2018 62  <150 mg/dL Final   HDL 11/07/2018 36* >40 mg/dL Final   Total CHOL/HDL Ratio 11/07/2018 4.0  RATIO Final   VLDL 11/07/2018 12  0 - 40 mg/dL Final   LDL Cholesterol 11/07/2018 95  0 - 99 mg/dL Final   Comment:        Total Cholesterol/HDL:CHD Risk Coronary Heart Disease Risk Table                     Men   Women  1/2 Average Risk   3.4   3.3  Average Risk       5.0   4.4  2 X Average Risk   9.6   7.1  3 X Average Risk  23.4   11.0        Use the calculated Patient Ratio above and the CHD Risk Table to determine the patient's CHD Risk.        ATP III CLASSIFICATION (LDL):  <100     mg/dL   Optimal  100-129  mg/dL   Near or Above                    Optimal  130-159  mg/dL   Borderline  160-189  mg/dL   High  >190     mg/dL   Very High Performed at [facility], [address] [place], [place] [address]      Assessment:  Kim Santiago is a 84 y.o. female with stage IIB left breast cancer s/p mastectomy on 11/18/2015.  Pathology revealed a 3.9 cm grade II invasive mammary carcinoma.  There was ductal carcinoma in situ (DCIS).  There was lymphvascular and dermal lymphatic invasion.  Margins were negative.  One of 2  sentinel lymph nodes were positive for macrometastasis.  In addition, there were 3 benign axillary lymph nodes (total 5 lymph nodes).  Tumor was estrogen receptor positive (100%), progesterone receptor positive (70%), and Her2/neu negative.  Pathologic stage was T2N1a.   Bilateral mammogram and ultrasound on 11/11/2015 revealed a 3 cm left breast mass at the 6 o'clock position (1 cm from the nipple) and a 1.6 cm irregular hypoechoic left breast mass at 1 o'clock position (5 cm from the nipple) with overlying skin thickening.  Right mammogram on 11/18/2016 revealed no evidence of malignancy.  Right mammogram on 11/23/2017 revealed no evidence of malignancy.  MammaPrint on 12/18/2015 revealed a low risk of recurrence with a 5%  risk of distant recurrence of 5 years and a 10% recurrence at 10 years.  She declined radiation.  She began Femara on 01/09/2016.  She has a little joint pain and a few hot flashes.  PET scan on 12/23/2016 revealed slightly enlarged right lower paratracheal lymph node and some clustered small AP window lymph nodes with metabolic activity very minimally above background in the mediastinum.  There were no other findings of hypermetabolic lesions in the neck, chest, abdomen, or pelvis.  Chest CT on 03/16/2017 revealed stable mild mediastinal lymphadenopathy.  Largest node was 1.0 cm in the right paratracheal region.  There was no new or progressive disease within thorax.  Chest CT on 09/23/2017 revealed stable mild mediastinal adenopathy (1.1 cm precarinal and 0.9 cm pretracheal lymph node).  There was no new or progressive disease.  Chest CT on 09/26/2018 revealed a stable exam.  There was no change in the previous described mediastinal lymph nodes.  There were stable tiny right lung nodules consistent with benign etiology  CA27.29 has been followed: 30.4 on 11/15/2015, 32.5 on 05/13/2016, 19.0 on 08/12/2016, 40.8 on 11/11/2016, 45.8 on 12/14/2016, 31.6 on 03/17/2017, 45.7 on 07/14/2017,  43.4 on 12/08/2017, 33.2 on 04/27/2018, and 31.3 on 08/24/2018.  Bone density on 12/05/2015 revealed osteopenia with a T-score of -1.5 in the left femoral neck.  Bone density on 12/07/2017 revealed osteopenia with a T-score of -1.6 in the right femoral neck.  She is on calcium and vitamin D.  She has a history of a myocardial infarction in 06/2012.  Echocardiogram revealed an EF of 35%.  Follow-up echo on 09/21/2015 revealed an EF of 55-60%.  Patient is unaware of a history of heart failure.   She has a history of thyroid carcinoma 40 years ago treated with I-131.   She has a mild normocytic anemia.  Diet is good.  Colonoscopy on 07/25/2013 revealed 3 small polyps (51m proximal descending colon, 3 mm splenic flexure, 1 mm ascending colon).  She denies any melena or hematochezia.  TSH was normal on 12/26/2015 and 08/24/2018.  Copper was normal on 08/24/2018.  She has cutaneous lupus and Sjogren's.  She had a reaction to Plaquenil.  She completed an extended steroid taper. She is on methotrexate 8 tablets every Wednesday (increased 3-4 weeks ago).  [facility].  She presented with left sided focal numbness and weakness.  Head MRI and MRA without contrast revealed an acute versus subacute faint right almost nonhemorrhagic infarct.  There was moderate to severe chronic small vessel ischemic changes.  There was no large vessel flow-limiting stenosis. Bilateral carotid ultrasound revealed < 50% stenosis in the right and left internal carotid arteries  Symptomatically, she notes 2 recent syncopal episodes.  She is concerned about being on losartan.  She denies any breast concerns.  Plan: 1. Labs today:  CBC with diff, CMP, CA27.29. 2. Stage IIB left breast cancer Clinically doing well. No recent labs. Continue Femara. Right sided mammogram delayed secondary to COVID-19. Address BCI testing at next visit. 3. Leukopenia - stable  WBC 3300 with an  ANC of 2000 on 11/07/2018. Possibly related to lupus. TSH was normal on 10/04/2018.  Copper was normal on 08/24/2018. No interval infections. Check flow cytometry with next lab draw. 4. Mediastinal adenopathy Chest CT on 09/26/2018 personally reviewed. Mediastinal adenopathy stable. 5. Osteopenia Patient on calcium and vitamin D. Prolia currently on hold. 6. Cutaneous lupus Patient continues to use topical ointments per  Dr. Baxter Kail in dermatology at [facility]. 7. Syncope   Patient reports recent syncope x 2.   Patient concerned about dosing of losartan and blood pressure.  Patient denies any arrhythmia or other symptoms.   Discuss evaluation by Dr Derrel Nip.  Encouraged her to call today for an immediate evaluation. 8.   RTC for labs (CBC with diff, CMP, flow cytometry). 9.   RTC on 02/22/2019 for MD assessment, labs (CBC with diff, CMP, CA27.29), and review of mammogram.  I discussed the assessment and treatment plan with the patient.  The patient was provided an opportunity to ask questions and all were answered.  The patient agreed with the plan and demonstrated an understanding of the instructions.  The patient was advised to call back or seek an in person evaluation if the symptoms worsen or if the condition fails to improve as anticipated.  I provided 15 minutes (4:09 PM - 4:24 PM) of non-face-to-face time during this encounter.  I provided these services from the [facility] office.   Lequita Asal, MD, PhD  12/16/2018, 4:12 PM

## 2018-12-16 NOTE — Telephone Encounter (Signed)
Patient calling stating that she passed out last night for an unknown amount of time maybe 10-20 min per pt. Pt states the last thing that she remembers is checking her emails around 11 am and feeling sleepy and then she woke up on the floor against the baseboard behind her chair. Pt states she had to crawl on the floor to get to the phone so that she could call her daughter who was in the home for assistance. Pt states she could not get up. Pt states she also experienced a syncopal episode days prior to the episode last night. Pt thinks syncopal episode may be related to taking Losartan 50 mg tab at night.Pt states she was previously prescribed 25 mg. Pt states she was prescribed this medication after having a stroke. Pt states that both times she experienced the syncopal episode she had taken Losartan 50 mg approximately an hour before. Pt states she feels fine at this time and has no symptoms. Pt also seen be oncologist today via virtual visit and was advised to contact PCP. Pt states she does not check BP at home because it causes her to become more stressed. Pt advised to be seen in the ED for evaluation. Pt verbalized understanding and states she will talk with her daughter about going tonight. Pt can be contacted at   [phone] .                                  Reason for Disposition . [1] Age > 50 years  AND [2] now alert and feels fine  Answer Assessment - Initial Assessment Questions 1. ONSET: "How long were you unconscious?" (minutes) "When did it happen?"     Maybe like 10 min -20 min but not sure 2. CONTENT: "What happened during period of unconsciousness?" (e.g., seizure activity)      Unsure, pt states she remembers sitting at her computer checking her emails around 11 am and feeling sleepy, pt states that the next thing she remembers is being on the baseboard by her computer chair and having to crawl to get to the phone to cal her daughter who was in the home 3. MENTAL STATUS: "Alert and  oriented now?" (oriented x 3 = name, month, location)      Oriented x 3, pt states she feels fine at this time 4. TRIGGER: "What do you think caused the fainting?" "What were you doing just before you fainted?"  (e.g., exercise, sudden standing up, prolonged standing)     Possibly due to taking Losartan 5. RECURRENT SYMPTOM: "Have you ever passed out before?" If so, ask: "When was the last time?" and "What happened that time?"      Happened last night and also a time before 6. INJURY: "Did you sustain any injury during the fall?"      Shoulder is really bruised 7. CARDIAC SYMPTOMS: "Have you had any of the following symptoms: chest pain, difficulty breathing, palpitations?"     No 8. NEUROLOGIC SYMPTOMS: "Have you had any of the following symptoms: headache, numbness, vertigo, weakness?"     No 9. GI SYMPTOMS: "Have you had any of the following symptoms: abdominal pain, vomiting, diarrhea, blood in stools?"    No 10. OTHER SYMPTOMS: "Do you have any other symptoms?"  Protocols used: [facility]

## 2018-12-17 ENCOUNTER — Encounter: Payer: Self-pay | Admitting: Hematology and Oncology

## 2018-12-19 ENCOUNTER — Inpatient Hospital Stay: Payer: PPO

## 2018-12-19 ENCOUNTER — Telehealth: Payer: Self-pay

## 2018-12-19 ENCOUNTER — Other Ambulatory Visit: Payer: Self-pay

## 2018-12-19 DIAGNOSIS — D72819 Decreased white blood cell count, unspecified: Secondary | ICD-10-CM

## 2018-12-19 DIAGNOSIS — C50912 Malignant neoplasm of unspecified site of left female breast: Secondary | ICD-10-CM

## 2018-12-19 DIAGNOSIS — M85851 Other specified disorders of bone density and structure, right thigh: Secondary | ICD-10-CM

## 2018-12-19 DIAGNOSIS — Z170 Estrogen receptor positive status [ER+]: Secondary | ICD-10-CM

## 2018-12-19 DIAGNOSIS — R590 Localized enlarged lymph nodes: Secondary | ICD-10-CM

## 2018-12-19 LAB — COMPREHENSIVE METABOLIC PANEL
ALT: 10 U/L (ref 0–44)
AST: 21 U/L (ref 15–41)
Albumin: 3.7 g/dL (ref 3.5–5.0)
Alkaline Phosphatase: 79 U/L (ref 38–126)
Anion gap: 5 (ref 5–15)
BUN: 14 mg/dL (ref 8–23)
CO2: 28 mmol/L (ref 22–32)
Calcium: 9 mg/dL (ref 8.9–10.3)
Chloride: 101 mmol/L (ref 98–111)
Creatinine, Ser: 0.78 mg/dL (ref 0.44–1.00)
GFR calc Af Amer: >60 mL/min (ref >60)
GFR calc non Af Amer: >60 mL/min (ref >60)
Glucose, Bld: 110 mg/dL — ABNORMAL HIGH (ref 70–99)
Potassium: 4 mmol/L (ref 3.5–5.1)
Sodium: 134 mmol/L — ABNORMAL LOW (ref 135–145)
Total Bilirubin: 0.7 mg/dL (ref 0.3–1.2)
Total Protein: 6.9 g/dL (ref 6.5–8.1)

## 2018-12-19 LAB — CBC WITH DIFFERENTIAL/PLATELET
Abs Immature Granulocytes: 0.04 10*3/uL (ref 0.00–0.07)
Basophils Absolute: 0.1 10*3/uL (ref 0.0–0.1)
Basophils Relative: 1 %
Eosinophils Absolute: 0.2 10*3/uL (ref 0.0–0.5)
Eosinophils Relative: 4 %
HCT: 31.7 % — ABNORMAL LOW (ref 36.0–46.0)
Hemoglobin: 10.7 g/dL — ABNORMAL LOW (ref 12.0–15.0)
Immature Granulocytes: 1 %
Lymphocytes Relative: 32 %
Lymphs Abs: 1.1 10*3/uL (ref 0.7–4.0)
MCH: 34.1 pg — ABNORMAL HIGH (ref 26.0–34.0)
MCHC: 33.8 g/dL (ref 30.0–36.0)
MCV: 101 fL — ABNORMAL HIGH (ref 80.0–100.0)
Monocytes Absolute: 0.4 10*3/uL (ref 0.1–1.0)
Monocytes Relative: 11 %
Neutro Abs: 1.7 10*3/uL (ref 1.7–7.7)
Neutrophils Relative %: 51 %
Platelets: 129 10*3/uL — ABNORMAL LOW (ref 150–400)
RBC: 3.14 MIL/uL — ABNORMAL LOW (ref 3.87–5.11)
RDW: 15.8 % — ABNORMAL HIGH (ref 11.5–15.5)
WBC: 3.5 10*3/uL — ABNORMAL LOW (ref 4.0–10.5)
nRBC: 0 % (ref 0.0–0.2)

## 2018-12-19 NOTE — Telephone Encounter (Signed)
Spoke with pt's daughter to inform her that reducing the losartan to 25mg  once daily is right. Pt's daughter gave a verbal understanding.

## 2018-12-19 NOTE — Telephone Encounter (Signed)
Contacted Kim Santiago to inquire on symptoms since having syncopy episodes last week. Patient states she is doing much better and denies having any more episodes. Reports she has spoken with [facility] (benefit through her insurance) and they informed her to decrease Losartan dose to 25 MG. Patient is to come in today to have labs drawn.

## 2018-12-19 NOTE — Telephone Encounter (Signed)
Attempted to call pt no answer no voicemail.  

## 2018-12-19 NOTE — Telephone Encounter (Signed)
Reduce the losartan to 25 mg ONCE DAILY .  TAKING IT TWICE DAILY IS THE SAME AS 50 MG DAILY

## 2018-12-19 NOTE — Telephone Encounter (Signed)
Called pt's daughter(on DPR) to see how pt was doing and to see if the pt went to the ED over the weekend. The daughter stated that they did not go to the ED but that they called [facility] and spoke with a NP. The NP was in agreement with the pt thinking that since these syncopal episodes occurred about one hour after taking the Losartan 50mg  that they should reduce the dose to 25mg . The pt has reduced the dose to 25mg  and has not had any further problems. However the pt's daughter is wanting to know if they should have just reduced the dose to 25mg  or if they should have split up the 50mg  to 25mg  in the am and 25mg  in the pm.

## 2018-12-21 LAB — COMP PANEL: LEUKEMIA/LYMPHOMA

## 2019-01-03 ENCOUNTER — Ambulatory Visit: Payer: PPO

## 2019-01-09 ENCOUNTER — Telehealth: Payer: Self-pay | Admitting: Internal Medicine

## 2019-01-09 ENCOUNTER — Other Ambulatory Visit: Payer: Self-pay

## 2019-01-09 MED ORDER — METOPROLOL SUCCINATE ER 25 MG PO TB24: 25.0000 mg | ORAL_TABLET | Freq: Every day | ORAL | 0 refills | Status: DC

## 2019-01-09 NOTE — Telephone Encounter (Signed)
Medication refill sent to pharmacy.  Nina,cma

## 2019-01-09 NOTE — Telephone Encounter (Signed)
Copied from [place] [phone]. Topic: Quick Communication - Rx Refill/Question >> Jan 09, 2019 12:15 PM Pauline Good wrote: Medication: metoprolol 25mg   Has the patient contacted their pharmacy? yes (Agent: If no, request that the patient contact the pharmacy for the refill.) (Agent: If yes, when and what did the pharmacy advise?)  Preferred Pharmacy (with phone number or street name): [facility]  Agent: Please be advised that RX refills may take up to 3 business days. We ask that you follow-up with your pharmacy.

## 2019-01-17 ENCOUNTER — Ambulatory Visit: Payer: PPO | Admitting: General Surgery

## 2019-01-20 DIAGNOSIS — H52223 Regular astigmatism, bilateral: Secondary | ICD-10-CM | POA: Diagnosis not present

## 2019-01-20 DIAGNOSIS — H2513 Age-related nuclear cataract, bilateral: Secondary | ICD-10-CM | POA: Diagnosis not present

## 2019-01-20 DIAGNOSIS — H5213 Myopia, bilateral: Secondary | ICD-10-CM | POA: Diagnosis not present

## 2019-01-20 DIAGNOSIS — H524 Presbyopia: Secondary | ICD-10-CM | POA: Diagnosis not present

## 2019-01-31 ENCOUNTER — Other Ambulatory Visit: Payer: Self-pay | Admitting: Internal Medicine

## 2019-01-31 ENCOUNTER — Telehealth: Payer: Self-pay | Admitting: Internal Medicine

## 2019-01-31 MED ORDER — ATORVASTATIN CALCIUM 20 MG PO TABS: 20.0000 mg | ORAL_TABLET | Freq: Every day | ORAL | 1 refills | Status: DC

## 2019-01-31 MED ORDER — SERTRALINE HCL 50 MG PO TABS: 50.0000 mg | ORAL_TABLET | Freq: Every day | ORAL | 1 refills | Status: DC

## 2019-01-31 NOTE — Telephone Encounter (Signed)
Medications have been refilled ?

## 2019-01-31 NOTE — Telephone Encounter (Signed)
Copied from [place] [phone]. Topic: Quick Communication - Rx Refill/Question >> Jan 31, 2019 11:04 AM Waylan Rocher, Lumin L wrote: Medication: atorvastatin (LIPITOR) 20 MG tablet (prescribed in the hospital), sertraline (ZOLOFT) 50 MG tablet  (90 days supply if she is supposed to use either of these long term)  Has the patient contacted their pharmacy? yes  (Agent: If no, request that the patient contact the pharmacy for the refill.) (Agent: If yes, when and what did the pharmacy advise?)  Preferred Pharmacy (with phone number or street name): If short term [id number] [address], [place] [address] Phone: [phone] Fax: [phone]  if long term [facility]  Agent: Please be advised that RX refills may take up to 3 business days. We ask that you follow-up with your pharmacy.   Patient is unsure if Dr. Derrel Nip wants her still taking zoloft.

## 2019-02-04 ENCOUNTER — Other Ambulatory Visit: Payer: Self-pay | Admitting: Internal Medicine

## 2019-02-16 DIAGNOSIS — I639 Cerebral infarction, unspecified: Secondary | ICD-10-CM | POA: Diagnosis not present

## 2019-02-22 ENCOUNTER — Ambulatory Visit: Payer: PPO | Admitting: Hematology and Oncology

## 2019-02-22 ENCOUNTER — Other Ambulatory Visit: Payer: PPO

## 2019-02-28 ENCOUNTER — Other Ambulatory Visit: Payer: Self-pay

## 2019-02-28 ENCOUNTER — Ambulatory Visit
Admission: RE | Admit: 2019-02-28 | Discharge: 2019-02-28 | Disposition: A | Discharge status: Home / Self Care | Payer: PPO | Source: Ambulatory Visit | Attending: Internal Medicine | Admitting: Internal Medicine

## 2019-02-28 DIAGNOSIS — H9319 Tinnitus, unspecified ear: Secondary | ICD-10-CM | POA: Diagnosis not present

## 2019-02-28 DIAGNOSIS — H903 Sensorineural hearing loss, bilateral: Secondary | ICD-10-CM | POA: Diagnosis not present

## 2019-02-28 DIAGNOSIS — Z1231 Encounter for screening mammogram for malignant neoplasm of breast: Secondary | ICD-10-CM | POA: Diagnosis not present

## 2019-02-28 NOTE — Progress Notes (Signed)
[place]  [address], [address] [place], [place] [address] Phone: [phone]  Fax: [phone]   S[place] Day:  03/01/2019  Referring physician: [name], MD  Chief Complaint: Kim Santiago is a 84 y.o. female with stage IIB left breast cancer who is seen for 6 month assessment on Femara.  HPI: The patient was last seen in the medical oncology [facility] on 12/16/2018. At that time, she noted 2 recent syncopal episodes. She was concerned about being on losartan. She denied any breast concerns.  She continued Femara.  Patient has been having repeated syncopal episodes on 12/16/2018, which she attributes to taking Losartan 50 mg. She was previously prescribed 25 mg.  Labs on 12/19/2018: WBC 3,500, ANC 1,700, hemoglobin 10.7, hematocrit 31.7, platelets 129,000.  Flow cytometry revealed no significant immunophenotypic abnormality.  She was seen by Chipper Herb, NP in the [place] on 02/16/2019 for a history of a CVA.  She had some mild left sided weakness.  She was advised to take aspirin 81 mg on Mon, Wed, and Friday.   Right mammogram on 02/28/2019 revealed no malignancy.  During the interim, the patient states she is "feeling good."  She has had a lot of fluids recently; at the end of the day she has edema in her legs. The edema leaves her legs feeling tight and tender the next day. She is wearing a compression stocking.   She still has dry eyes secondary to Sjogern's. She reports being off balance, and decrease in hearing. She is having another flare up from lupus, and she is using ointment. She denies any other symptoms. She denies any breast concerns and she is doing breast exams as recommended. Her weight is up 6 lbs.   The patient noted when taking aspirin and Plavix together gave her nose bleeds in the past.The patient notes she recently came off of Plavix after 90 days this week. She is taking Femara and 81 mg aspirin daily.   Past  Medical History:  Diagnosis Date   Arthritis    Breast cancer ([place]) 2017   left mastectomy done 11/2015   Breast cancer in female [place]) 11/18/2015   Left: 3.9 cm tumor, T2, 1/2 sentinel nodes positive for macro metastatic disease, N1, 3 negative nodes in the axillary tail, ER+,PR+, Her 2 neu, low Mammoprint score   Cancer ([facility])    thyroid takes levothyroxine   Chronic kidney disease    UTI   Genetic screening March 2017.   Mammoprint of left breast cancer: Low risk for recurrence.    Hypertension    hypothyroidism    secondary to thyroidectomy for thyroid ca   Hypothyroidism    Lupus ([place])    subcutaneous   Menopause 40s   natural, hot flashes and mood lability now gone, off prempro 7 months   Myocardial infarction [facility]) 2013   Osteoporosis    Osteopenia   Rosacea    Sjoegren syndrome    Stress-induced cardiomyopathy September of 2013   EF 35%. Peak troponin was 1.8.    Past Surgical History:  Procedure Laterality Date   BACK SURGERY     BREAST BIOPSY Left 10/30/15   positive, done in Dr. Dwyane Luo office   CARDIAC CATHETERIZATION  05/2012   [place]. No significant CAD. Ejection fraction of 35% due to stress-induced cardiomyopathy.   CHOLECYSTECTOMY     COLONOSCOPY     DILATION AND CURETTAGE OF UTERUS     KYPHOSIS SURGERY  Feb 2008  L1, Dr. Mauri Pole   LUMBAR DISC SURGERY     L4-L5   MASTECTOMY Left 11/18/2015   positive   SENTINEL NODE BIOPSY Left 11/18/2015   Procedure: SENTINEL NODE BIOPSY;  Surgeon: Robert Bellow, MD;  Location: [facility] ORS;  Service: General;  Laterality: Left;   SHOULDER ARTHROSCOPY  2004   Left, Dr. Jefm Bryant   SIMPLE MASTECTOMY WITH AXILLARY SENTINEL NODE BIOPSY Left 11/18/2015   Procedure: SIMPLE MASTECTOMY;  Surgeon: Robert Bellow, MD;  Location: [facility] ORS;  Service: General;  Laterality: Left;   SPINE SURGERY     L4-5 diskectomy   THYROIDECTOMY     Thyroid Cancer   TONSILLECTOMY     TUBAL LIGATION       Family History  Problem Relation Age of Onset   COPD Father    Cancer Father        esophageal   Kidney disease Mother    Heart disease Mother    Kidney disease Sister    Cancer Brother 42       colon cancer (both brothers)   Heart attack Brother 57   Heart disease Brother    Breast cancer Neg Hx     Social History:  reports that she has never smoked. She has never used smokeless tobacco. She reports that she does not drink alcohol or use drugs.She is from [place], [place].  She was married for 10 years.  Her husband died 30 months ago.  She lives alone in [place].  She has an adopted daughter and 2 grandchildren.  Her brother died of a MI following a cholecystectomy in 12/2015.  The patient is living at her daughter's house since her CVA in 11/2018.The patient is alone today.  Allergies:  Allergies  Allergen Reactions   Azathioprine Nausea And Vomiting    Severe vomiting   Hydroxychloroquine Hives and Nausea And Vomiting   Mycophenolate Mofetil Nausea Only   Amoxicillin Rash    Has patient had a PCN reaction causing immediate rash, facial/tongue/throat swelling, SOB or lightheadedness with hypotension: Unknown Has patient had a PCN reaction causing severe rash involving mucus membranes or skin necrosis: Unknown Has patient had a PCN reaction that required hospitalization: Unknown Has patient had a PCN reaction occurring within the last 10 years: Unknown If all of the above answers are "NO", then may proceed with Cephalosporin use. Tolerates ceftriaxone and Ancef   Codeine Nausea And Vomiting   Naprosyn [Naproxen] Swelling   Orudis [Ketoprofen] Hives   Sulfa Antibiotics Rash   Sulfathiazole Rash    Current Medications: Current Outpatient Medications  Medication Sig Dispense Refill   aspirin EC 81 MG EC tablet Take 1 tablet (81 mg total) by mouth daily. 180 tablet 0   atorvastatin (LIPITOR) 20 MG tablet TAKE 1 TABLET BY MOUTH DAILY AT 6PM 90 tablet 1    Calcium Carbonate-Vitamin D (CALCIUM + D) 600-200 MG-UNIT TABS Take 2 tablets by mouth daily.      cholecalciferol (VITAMIN D) 1000 UNITS tablet Take 2,000 Units by mouth daily.      CRANBERRY PO Take 1 capsule by mouth 2 (two) times daily.     furosemide (LASIX) 20 MG tablet 1 tablet every other day as needed 30 tablet 3   letrozole (FEMARA) 2.5 MG tablet Take 1 tablet (2.5 mg total) by mouth daily. 90 tablet 3   levothyroxine (SYNTHROID, LEVOTHROID) 75 MCG tablet Take 1 tablet by mouth (13mg total) daily 90 tablet 0   losartan (COZAAR) 50 MG tablet  Take 1 tablet (50 mg total) by mouth daily. (Patient taking differently: Take 25 mg by mouth daily. ) 90 tablet 0   metolazone (ZAROXOLYN) 2.5 MG tablet TAKE 1 TABLET (2.5 MG TOTAL) BY MOUTH DAILY. 30 MINUTES BEFORE FUROSEMIDE DOSE 90 tablet 1   metoprolol succinate (TOPROL-XL) 25 MG 24 hr tablet Take 1 tablet (25 mg total) by mouth daily. 90 tablet 0   mirtazapine (REMERON) 7.5 MG tablet Take 1 tablet (7.5 mg total) by mouth at bedtime. 90 tablet 0   nitroGLYCERIN (NITROSTAT) 0.4 MG SL tablet Place 1 tablet (0.4 mg total) under the tongue every 5 (five) minutes as needed for chest pain. Maximum 3 doses 50 tablet 3   omeprazole (PRILOSEC) 20 MG capsule Take 1 capsule by mouth twice a day before a meal 180 capsule 0   polyethylene glycol (MIRALAX / GLYCOLAX) packet Take 17 g by mouth daily.     Probiotic Product (PROBIOTIC PO) Take 1 tablet by mouth daily.     sertraline (ZOLOFT) 25 MG tablet TAKE 1 TABLET BY MOUTH EVERY DAY 90 tablet 1   sodium fluoride (DENTA 5000 PLUS) 1.1 % CREA dental cream Take 1 application by mouth daily.     traMADol (ULTRAM) 50 MG tablet Take 1 tablet (50 mg total) by mouth every 8 (eight) hours as needed. 60 tablet 2   triamcinolone ointment (KENALOG) 0.1 % Apply 1 application topically daily.  0   ondansetron (ZOFRAN ODT) 4 MG disintegrating tablet Take 1 tablet (4 mg total) by mouth every 8 (eight)  hours as needed for nausea or vomiting. (Patient not taking: Reported on 03/01/2019) 20 tablet 0   tizanidine (ZANAFLEX) 2 MG capsule Take 1 capsule (2 mg total) by mouth 3 (three) times daily. (Patient not taking: Reported on 03/01/2019) 90 capsule 0   No current facility-administered medications for this visit.     Review of Systems  Constitutional: Negative for chills, diaphoresis, fever, malaise/fatigue and weight loss (up 6 lbs.).       Feeling "good".  HENT: Positive for hearing loss. Negative for congestion, ear discharge, ear pain, nosebleeds, sinus pain, sore throat and tinnitus.        Xerostomia due to Sjogren's.  Eyes: Negative for double vision, photophobia and pain.       Dry eyes secondary to Sjogren's.  Respiratory: Negative.  Negative for cough, hemoptysis, sputum production and shortness of breath.   Cardiovascular: Negative.  Negative for chest pain, palpitations, orthopnea, leg swelling and PND.  Gastrointestinal: Negative.  Negative for abdominal pain, blood in stool, constipation, diarrhea, heartburn, melena, nausea and vomiting.  Genitourinary: Negative.  Negative for dysuria, frequency, hematuria and urgency.  Musculoskeletal: Positive for joint pain. Negative for back pain, falls, myalgias and neck pain.  Skin: Positive for rash (cutaneous lupus "ugly", using ointments). Negative for itching.  Neurological: Negative for dizziness, tingling, tremors, sensory change, speech change, focal weakness, weakness and headaches.       Interval CVA.  Recent syncopal episodes. Off balance.  Endo/Heme/Allergies: Does not bruise/bleed easily.       HYPOthyroidism on levothyroxine.  Psychiatric/Behavioral: Negative.  Negative for depression and memory loss. The patient is not nervous/anxious and does not have insomnia.   All other systems reviewed and are negative.  Performance status (ECOG): 1  Blood pressure 122/74, pulse 78, temperature 98.3 F (36.8 C), temperature source  Tympanic, resp. rate 16, weight 139 lb 7.1 oz (63.3 kg), SpO2 100 %.  Physical Exam  Constitutional: She is  oriented to person, place, and time. She appears well-developed and well-nourished. No distress.  HENT:  Head: Normocephalic and atraumatic.  Mouth/Throat: Oropharynx is clear and moist.  Short styled gray hair.  Eyes: Pupils are equal, round, and reactive to light. Conjunctivae and EOM are normal. No scleral icterus.  Glasses.  Neck: Normal range of motion. Neck supple.  Cardiovascular: Normal rate, regular rhythm and normal heart sounds.  No murmur heard. Pulmonary/Chest: Effort normal and breath sounds normal. No respiratory distress. Right breast exhibits tenderness (fibrocytic changes).  Abdominal: Soft. Bowel sounds are normal. There is no abdominal tenderness.  Musculoskeletal: Normal range of motion.        General: No edema.  Lymphadenopathy:    She has no cervical adenopathy.    She has no axillary adenopathy.       Right: No supraclavicular adenopathy present.       Left: No supraclavicular adenopathy present.  Neurological: She is alert and oriented to person, place, and time.  Skin: Skin is warm and dry. She is not diaphoretic.  Upper extremity bruising.  Psychiatric: She has a normal mood and affect. Her behavior is normal. Judgment and thought content normal.  Nursing note and vitals reviewed.    Appointment on 03/01/2019  Component Date Value Ref Range Status   Sodium 03/01/2019 136  135 - 145 mmol/L Final   Potassium 03/01/2019 4.1  3.5 - 5.1 mmol/L Final   Chloride 03/01/2019 101  98 - 111 mmol/L Final   CO2 03/01/2019 28  22 - 32 mmol/L Final   Glucose, Bld 03/01/2019 104* 70 - 99 mg/dL Final   BUN 03/01/2019 16  8 - 23 mg/dL Final   Creatinine, Ser 03/01/2019 0.68  0.44 - 1.00 mg/dL Final   Calcium 03/01/2019 8.5* 8.9 - 10.3 mg/dL Final   Total Protein 03/01/2019 6.3* 6.5 - 8.1 g/dL Final   Albumin 03/01/2019 3.6  3.5 - 5.0 g/dL Final    AST 03/01/2019 22  15 - 41 U/L Final   ALT 03/01/2019 9  0 - 44 U/L Final   Alkaline Phosphatase 03/01/2019 83  38 - 126 U/L Final   Total Bilirubin 03/01/2019 0.6  0.3 - 1.2 mg/dL Final   GFR calc non Af Amer 03/01/2019 >60  >60 mL/min Final   GFR calc Af Amer 03/01/2019 >60  >60 mL/min Final   Anion gap 03/01/2019 7  5 - 15 Final   Performed at [facility], [address], [place], [place] [address]   WBC 03/01/2019 3.3* 4.0 - 10.5 K/uL Final   RBC 03/01/2019 3.06* 3.87 - 5.11 MIL/uL Final   Hemoglobin 03/01/2019 10.6* 12.0 - 15.0 g/dL Final   HCT 03/01/2019 30.7* 36.0 - 46.0 % Final   MCV 03/01/2019 100.3* 80.0 - 100.0 fL Final   MCH 03/01/2019 34.6* 26.0 - 34.0 pg Final   MCHC 03/01/2019 34.5  30.0 - 36.0 g/dL Final   RDW 03/01/2019 15.1  11.5 - 15.5 % Final   Platelets 03/01/2019 129* 150 - 400 K/uL Final   nRBC 03/01/2019 0.0  0.0 - 0.2 % Final   Neutrophils Relative % 03/01/2019 50  % Final   Neutro Abs 03/01/2019 1.7  1.7 - 7.7 K/uL Final   Lymphocytes Relative 03/01/2019 30  % Final   Lymphs Abs 03/01/2019 1.0  0.7 - 4.0 K/uL Final   Monocytes Relative 03/01/2019 10  % Final   Monocytes Absolute 03/01/2019 0.3  0.1 - 1.0 K/uL Final   Eosinophils  Relative 03/01/2019 7  % Final   Eosinophils Absolute 03/01/2019 0.2  0.0 - 0.5 K/uL Final   Basophils Relative 03/01/2019 2  % Final   Basophils Absolute 03/01/2019 0.1  0.0 - 0.1 K/uL Final   Immature Granulocytes 03/01/2019 1  % Final   Abs Immature Granulocytes 03/01/2019 0.04  0.00 - 0.07 K/uL Final   Performed at [facility], [address], [place], [place] [address]    Assessment:  Kim Santiago is a 84 y.o. female with stage IIB left breast cancer s/p mastectomy on 11/18/2015.  Pathology revealed a 3.9 cm grade II invasive mammary carcinoma.  There was ductal carcinoma in situ (DCIS).  There was lymphvascular and dermal lymphatic invasion.  Margins were negative.   One of 2 sentinel lymph nodes were positive for macrometastasis.  In addition, there were 3 benign axillary lymph nodes (total 5 lymph nodes).  Tumor was estrogen receptor positive (100%), progesterone receptor positive (70%), and Her2/neu negative.  Pathologic stage was T2N1a.   Bilateral mammogram and ultrasound on 11/11/2015 revealed a 3 cm left breast mass at the 6 o'clock position (1 cm from the nipple) and a 1.6 cm irregular hypoechoic left breast mass at 1 o'clock position (5 cm from the nipple) with overlying skin thickening.  Right mammogram on 11/18/2016 revealed no evidence of malignancy.  Right mammogram on 11/23/2017 revealed no evidence of malignancy.  MammaPrint on 12/18/2015 revealed a low risk of recurrence with a 5% risk of distant recurrence of 5 years and a 10% recurrence at 10 years.  She declined radiation.  She began Femara on 01/09/2016.  She has a little joint pain and a few hot flashes.  PET scan on 12/23/2016 revealed slightly enlarged right lower paratracheal lymph node and some clustered small AP window lymph nodes with metabolic activity very minimally above background in the mediastinum.  There were no other findings of hypermetabolic lesions in the neck, chest, abdomen, or pelvis.  Chest CT on 03/16/2017 revealed stable mild mediastinal lymphadenopathy.  Largest node was 1.0 cm in the right paratracheal region.  There was no new or progressive disease within thorax.  Chest CT on 09/23/2017 revealed stable mild mediastinal adenopathy (1.1 cm precarinal and 0.9 cm pretracheal lymph node).  There was no new or progressive disease.  Chest CT on 09/26/2018 revealed a stable exam.  There was no change in the previous described mediastinal lymph nodes.  There were stable tiny right lung nodules consistent with benign etiology  CA27.29 has been followed: 30.4 on 11/15/2015, 32.5 on 05/13/2016, 19.0 on 08/12/2016, 40.8 on 11/11/2016, 45.8 on 12/14/2016, 31.6 on 03/17/2017, 45.7  on 07/14/2017, 43.4 on 12/08/2017, 33.2 on 04/27/2018, 31.3 on 08/24/2018, and 43.1 on 03/01/2019.  Bone density on 12/05/2015 revealed osteopenia with a T-score of -1.5 in the left femoral neck.  Bone density on 12/07/2017 revealed osteopenia with a T-score of -1.6 in the right femoral neck.  She is on calcium and vitamin D.  She has a history of a myocardial infarction in 06/2012.  Echocardiogram revealed an EF of 35%.  Follow-up echo on 09/21/2015 revealed an EF of 55-60%.  Patient is unaware of a history of heart failure.   She has a history of thyroid carcinoma 40 years ago treated with I-131.   She has a mild normocytic anemia.  Diet is good.  Colonoscopy on 07/25/2013 revealed 3 small polyps (10m proximal descending colon, 3 mm splenic flexure, 1 mm ascending colon).  She denies any  melena or hematochezia.  TSH was normal on 12/26/2015 and 08/24/2018.  Copper was normal on 08/24/2018.  She has cutaneous lupus and Sjogren's.  She had a reaction to Plaquenil.  She completed an extended steroid taper. She is on methotrexate 8 tablets every Wednesday (increased 3-4 weeks ago).  She was admitted to [facility] from 11/07/2018 - 11/08/2018 with a CVA.  She presented with left sided focal numbness and weakness.  Head MRI and MRA without contrast revealed an acute versus subacute faint right almost nonhemorrhagic infarct.  There was moderate to severe chronic small vessel ischemic changes.  There was no large vessel flow-limiting stenosis.  Bilateral carotid ultrasound revealed < 50% stenosis in the right and left internal carotid arteries  Symptomatically, she is having issues with fluid in her legs.  She has had ongoing lupus flare and is using topical ointments.  She denies any breast concerns.  Exam is stable.  Plan: 1.   Labs today:  CBC with diff, CMP, CA27.29. 2.   Stage IIB left breast cancer Clinically, she continues to do well.   She voices no breast concerns.   Exam revealed stable  fibrocystic changes.   Continue Femara.   Follow-up regarding scheduled mammogram (RN to call). 3.   Leukopenia - stable  WBC 3300.  ANC 1700. Etiology possibly related to lupus. TSH and copper have been normal. Flow cytometry on 12/19/2018 revealed no abnormality.  She has had no interval infections. Re-review neutropenic precautions. 4.   Mediastinal adenopathy Chest CT on 09/26/2018 revealed stable mediastinal adenopathy.   Consider repeat imaging in 1 year. 5.   Osteopenia Patient remains on calcium and vitamin D.  Corrected calcium 8.84. Prolia on hold. 6.   Cutaneous lupus Patient with a current lupus flare.    She is using topical ointments per [facility] dermatology. 7.   RTC in 3 months for labs (CBC with diff). 8.   RTC in 6 months for MD assessment and labs (CBC with diff, CMP, CA27.29).  I discussed the assessment and treatment plan with the patient.  The patient was provided an opportunity to ask questions and all were answered.  The patient agreed with the plan and demonstrated an understanding of the instructions.  The patient was advised to call back if the symptoms worsen or if the condition fails to improve as anticipated.  I provided 25 minutes of face-to-face time during this this encounter and > 50% was spent counseling as documented under my assessment and plan.    Lequita Asal, MD, PhD    03/01/2019, 9:10 AM  I, Selena Batten, am acting as scribe for [employer]. Mike Gip, MD, PhD.  I, Jazmine Longshore C. Mike Gip, MD, have reviewed the above documentation for accuracy and completeness, and I agree with the above.

## 2019-03-01 ENCOUNTER — Other Ambulatory Visit: Payer: Self-pay

## 2019-03-01 ENCOUNTER — Encounter: Payer: Self-pay | Admitting: Hematology and Oncology

## 2019-03-01 ENCOUNTER — Inpatient Hospital Stay (HOSPITAL_BASED_OUTPATIENT_CLINIC_OR_DEPARTMENT_OTHER): Payer: PPO | Admitting: Hematology and Oncology

## 2019-03-01 ENCOUNTER — Inpatient Hospital Stay: Payer: PPO | Attending: Hematology and Oncology

## 2019-03-01 VITALS — BP 122/74 | HR 78 | Temp 98.3°F | Resp 16 | Wt 139.4 lb

## 2019-03-01 DIAGNOSIS — Z9012 Acquired absence of left breast and nipple: Secondary | ICD-10-CM

## 2019-03-01 DIAGNOSIS — D72819 Decreased white blood cell count, unspecified: Secondary | ICD-10-CM | POA: Diagnosis not present

## 2019-03-01 DIAGNOSIS — Z170 Estrogen receptor positive status [ER+]: Secondary | ICD-10-CM

## 2019-03-01 DIAGNOSIS — D649 Anemia, unspecified: Secondary | ICD-10-CM | POA: Insufficient documentation

## 2019-03-01 DIAGNOSIS — M85851 Other specified disorders of bone density and structure, right thigh: Secondary | ICD-10-CM

## 2019-03-01 DIAGNOSIS — Z79811 Long term (current) use of aromatase inhibitors: Secondary | ICD-10-CM | POA: Diagnosis not present

## 2019-03-01 DIAGNOSIS — C50912 Malignant neoplasm of unspecified site of left female breast: Secondary | ICD-10-CM

## 2019-03-01 DIAGNOSIS — R590 Localized enlarged lymph nodes: Secondary | ICD-10-CM

## 2019-03-01 DIAGNOSIS — N189 Chronic kidney disease, unspecified: Secondary | ICD-10-CM | POA: Insufficient documentation

## 2019-03-01 DIAGNOSIS — Z79899 Other long term (current) drug therapy: Secondary | ICD-10-CM | POA: Insufficient documentation

## 2019-03-01 DIAGNOSIS — Z8673 Personal history of transient ischemic attack (TIA), and cerebral infarction without residual deficits: Secondary | ICD-10-CM | POA: Insufficient documentation

## 2019-03-01 LAB — CBC WITH DIFFERENTIAL/PLATELET
Abs Immature Granulocytes: 0.04 10*3/uL (ref 0.00–0.07)
Basophils Absolute: 0.1 10*3/uL (ref 0.0–0.1)
Basophils Relative: 2 %
Eosinophils Absolute: 0.2 10*3/uL (ref 0.0–0.5)
Eosinophils Relative: 7 %
HCT: 30.7 % — ABNORMAL LOW (ref 36.0–46.0)
Hemoglobin: 10.6 g/dL — ABNORMAL LOW (ref 12.0–15.0)
Immature Granulocytes: 1 %
Lymphocytes Relative: 30 %
Lymphs Abs: 1 10*3/uL (ref 0.7–4.0)
MCH: 34.6 pg — ABNORMAL HIGH (ref 26.0–34.0)
MCHC: 34.5 g/dL (ref 30.0–36.0)
MCV: 100.3 fL — ABNORMAL HIGH (ref 80.0–100.0)
Monocytes Absolute: 0.3 10*3/uL (ref 0.1–1.0)
Monocytes Relative: 10 %
Neutro Abs: 1.7 10*3/uL (ref 1.7–7.7)
Neutrophils Relative %: 50 %
Platelets: 129 10*3/uL — ABNORMAL LOW (ref 150–400)
RBC: 3.06 MIL/uL — ABNORMAL LOW (ref 3.87–5.11)
RDW: 15.1 % (ref 11.5–15.5)
WBC: 3.3 10*3/uL — ABNORMAL LOW (ref 4.0–10.5)
nRBC: 0 % (ref 0.0–0.2)

## 2019-03-01 LAB — FOLATE: Folate: 14.5 ng/mL (ref >5.9)

## 2019-03-01 LAB — TSH: TSH: 0.961 u[IU]/mL (ref 0.350–4.500)

## 2019-03-01 LAB — IRON AND TIBC
Iron: 49 ug/dL (ref 28–170)
Saturation Ratios: 19 % (ref 10.4–31.8)
TIBC: 253 ug/dL (ref 250–450)
UIBC: 204 ug/dL

## 2019-03-01 LAB — COMPREHENSIVE METABOLIC PANEL
ALT: 9 U/L (ref 0–44)
AST: 22 U/L (ref 15–41)
Albumin: 3.6 g/dL (ref 3.5–5.0)
Alkaline Phosphatase: 83 U/L (ref 38–126)
Anion gap: 7 (ref 5–15)
BUN: 16 mg/dL (ref 8–23)
CO2: 28 mmol/L (ref 22–32)
Calcium: 8.5 mg/dL — ABNORMAL LOW (ref 8.9–10.3)
Chloride: 101 mmol/L (ref 98–111)
Creatinine, Ser: 0.68 mg/dL (ref 0.44–1.00)
GFR calc Af Amer: >60 mL/min (ref >60)
GFR calc non Af Amer: >60 mL/min (ref >60)
Glucose, Bld: 104 mg/dL — ABNORMAL HIGH (ref 70–99)
Potassium: 4.1 mmol/L (ref 3.5–5.1)
Sodium: 136 mmol/L (ref 135–145)
Total Bilirubin: 0.6 mg/dL (ref 0.3–1.2)
Total Protein: 6.3 g/dL — ABNORMAL LOW (ref 6.5–8.1)

## 2019-03-01 LAB — RETICULOCYTES
Immature Retic Fract: 11.8 % (ref 2.3–15.9)
RBC.: 3.17 MIL/uL — ABNORMAL LOW (ref 3.87–5.11)
Retic Count, Absolute: 46.3 10*3/uL (ref 19.0–186.0)
Retic Ct Pct: 1.5 % (ref 0.4–3.1)

## 2019-03-01 LAB — VITAMIN B12: Vitamin B-12: 904 pg/mL (ref 180–914)

## 2019-03-01 LAB — FERRITIN: Ferritin: 141 ng/mL (ref 11–307)

## 2019-03-01 NOTE — Progress Notes (Signed)
Pt here for follow up. Denies any concerns at this time.  

## 2019-03-02 ENCOUNTER — Telehealth: Payer: Self-pay

## 2019-03-02 ENCOUNTER — Other Ambulatory Visit: Payer: Self-pay

## 2019-03-02 ENCOUNTER — Ambulatory Visit: Payer: PPO | Admitting: General Surgery

## 2019-03-02 ENCOUNTER — Encounter: Payer: Self-pay | Admitting: General Surgery

## 2019-03-02 VITALS — BP 143/64 | HR 86 | Temp 97.9°F | Resp 16 | Ht 64.0 in | Wt 141.2 lb

## 2019-03-02 DIAGNOSIS — C50112 Malignant neoplasm of central portion of left female breast: Secondary | ICD-10-CM | POA: Diagnosis not present

## 2019-03-02 DIAGNOSIS — Z170 Estrogen receptor positive status [ER+]: Secondary | ICD-10-CM | POA: Diagnosis not present

## 2019-03-02 DIAGNOSIS — C50912 Malignant neoplasm of unspecified site of left female breast: Secondary | ICD-10-CM

## 2019-03-02 LAB — CANCER ANTIGEN 27.29: CA 27.29: 43.1 U/mL — ABNORMAL HIGH (ref 0.0–38.6)

## 2019-03-02 NOTE — Telephone Encounter (Signed)
VM left requesting patient to call back. (This is regarding elevated CA27.29. Dr. Mike Gip would like to recheck in 1 month)

## 2019-03-02 NOTE — Progress Notes (Signed)
Patient ID: Kim Santiago, female   DOB: 08-Nov-1934, 84 y.o.   MRN: [id number]  Chief Complaint  Patient presents with  . Follow-up    Right screening mammogram 1 year    HPI Kim Santiago is a 84 y.o. female.  Here today for right breast screening mammogram. No breast complaints.  Patient recently had a stroke March 2020.  She feels that she has made a good recovery.  HPI  Past Medical History:  Diagnosis Date  . Arthritis   . Breast cancer ([place]) 2017   left mastectomy done 11/2015  . Breast cancer in female [facility]) 11/18/2015   Left: 3.9 cm tumor, T2, 1/2 sentinel nodes positive for macro metastatic disease, N1, 3 negative nodes in the axillary tail, ER+,PR+, Her 2 neu, low Mammoprint score  . Cancer [facility])    thyroid takes levothyroxine  . Chronic kidney disease    UTI  . Genetic screening March 2017.   Mammoprint of left breast cancer: Low risk for recurrence.   . Hypertension   . hypothyroidism    secondary to thyroidectomy for thyroid ca  . Hypothyroidism   . Lupus ([facility])    subcutaneous  . Menopause 40s   natural, hot flashes and mood lability now gone, off prempro 7 months  . Myocardial infarction ([place]) 2013  . Osteoporosis    Osteopenia  . Rosacea   . Sjoegren syndrome   . Stress-induced cardiomyopathy September of 2013   EF 35%. Peak troponin was 1.8.    Past Surgical History:  Procedure Laterality Date  . BACK SURGERY    . BREAST BIOPSY Left 10/30/15   positive, done in Dr. Dwyane Luo office  . CARDIAC CATHETERIZATION  05/2012   [facility]. No significant CAD. Ejection fraction of 35% due to stress-induced cardiomyopathy.  . CHOLECYSTECTOMY    . COLONOSCOPY    . DILATION AND CURETTAGE OF UTERUS    . KYPHOSIS SURGERY  Feb 2008   L1, Dr. Mauri Pole  . LUMBAR DISC SURGERY     L4-L5  . MASTECTOMY Left 11/18/2015   positive  . SENTINEL NODE BIOPSY Left 11/18/2015   Procedure: SENTINEL NODE BIOPSY;  Surgeon: Robert Bellow, MD;  Location: [facility] ORS;  Service:  General;  Laterality: Left;  . SHOULDER ARTHROSCOPY  2004   Left, Dr. Jefm Bryant  . SIMPLE MASTECTOMY WITH AXILLARY SENTINEL NODE BIOPSY Left 11/18/2015   Procedure: SIMPLE MASTECTOMY;  Surgeon: Robert Bellow, MD;  Location: [facility] ORS;  Service: General;  Laterality: Left;  . SPINE SURGERY     L4-5 diskectomy  . THYROIDECTOMY     Thyroid Cancer  . TONSILLECTOMY    . TUBAL LIGATION      Family History  Problem Relation Age of Onset  . COPD Father   . Cancer Father        esophageal  . Kidney disease Mother   . Heart disease Mother   . Kidney disease Sister   . Cancer Brother 36       colon cancer (both brothers)  . Heart attack Brother 38  . Heart disease Brother   . Breast cancer Neg Hx     Social History Social History   Tobacco Use  . Smoking status: Never Smoker  . Smokeless tobacco: Never Used  Substance Use Topics  . Alcohol use: No  . Drug use: No    Allergies  Allergen Reactions  . Azathioprine Nausea And Vomiting    Severe vomiting  . Hydroxychloroquine Hives and  Nausea And Vomiting  . Mycophenolate Mofetil Nausea Only  . Amoxicillin Rash    Has patient had a PCN reaction causing immediate rash, facial/tongue/throat swelling, SOB or lightheadedness with hypotension: Unknown Has patient had a PCN reaction causing severe rash involving mucus membranes or skin necrosis: Unknown Has patient had a PCN reaction that required hospitalization: Unknown Has patient had a PCN reaction occurring within the last 10 years: Unknown If all of the above answers are "NO", then may proceed with Cephalosporin use. Tolerates ceftriaxone and Ancef  . Codeine Nausea And Vomiting  . Naprosyn [Naproxen] Swelling  . Orudis [Ketoprofen] Hives  . Sulfa Antibiotics Rash  . Sulfathiazole Rash    Current Outpatient Medications  Medication Sig Dispense Refill  . aspirin EC 81 MG EC tablet Take 1 tablet (81 mg total) by mouth daily. 180 tablet 0  . atorvastatin (LIPITOR) 20 MG  tablet TAKE 1 TABLET BY MOUTH DAILY AT 6PM 90 tablet 1  . Calcium Carbonate-Vitamin D (CALCIUM + D) 600-200 MG-UNIT TABS Take 2 tablets by mouth daily.     . cholecalciferol (VITAMIN D) 1000 UNITS tablet Take 2,000 Units by mouth daily.     Marland Kitchen CRANBERRY PO Take 1 capsule by mouth 2 (two) times daily.    . furosemide (LASIX) 20 MG tablet 1 tablet every other day as needed 30 tablet 3  . letrozole (FEMARA) 2.5 MG tablet Take 1 tablet (2.5 mg total) by mouth daily. 90 tablet 3  . losartan (COZAAR) 50 MG tablet Take 1 tablet (50 mg total) by mouth daily. (Patient taking differently: Take 25 mg by mouth daily. ) 90 tablet 0  . metolazone (ZAROXOLYN) 2.5 MG tablet TAKE 1 TABLET (2.5 MG TOTAL) BY MOUTH DAILY. 30 MINUTES BEFORE FUROSEMIDE DOSE 90 tablet 1  . metoprolol succinate (TOPROL-XL) 25 MG 24 hr tablet Take 1 tablet (25 mg total) by mouth daily. 90 tablet 0  . mirtazapine (REMERON) 7.5 MG tablet Take 1 tablet (7.5 mg total) by mouth at bedtime. 90 tablet 0  . nitroGLYCERIN (NITROSTAT) 0.4 MG SL tablet Place 1 tablet (0.4 mg total) under the tongue every 5 (five) minutes as needed for chest pain. Maximum 3 doses 50 tablet 3  . omeprazole (PRILOSEC) 20 MG capsule Take 1 capsule by mouth twice a day before a meal 180 capsule 0  . ondansetron (ZOFRAN ODT) 4 MG disintegrating tablet Take 1 tablet (4 mg total) by mouth every 8 (eight) hours as needed for nausea or vomiting. 20 tablet 0  . polyethylene glycol (MIRALAX / GLYCOLAX) packet Take 17 g by mouth daily.    . Probiotic Product (PROBIOTIC PO) Take 1 tablet by mouth daily.    . sertraline (ZOLOFT) 25 MG tablet TAKE 1 TABLET BY MOUTH EVERY DAY 90 tablet 1  . sodium fluoride (DENTA 5000 PLUS) 1.1 % CREA dental cream Take 1 application by mouth daily.    . traMADol (ULTRAM) 50 MG tablet Take 1 tablet (50 mg total) by mouth every 8 (eight) hours as needed. 60 tablet 2  . triamcinolone ointment (KENALOG) 0.1 % Apply 1 application topically daily.  0  .  levothyroxine (SYNTHROID) 75 MCG tablet Take 1 tablet by mouth (33mcg total) daily 90 tablet 2  . tizanidine (ZANAFLEX) 2 MG capsule Take 1 capsule (2 mg total) by mouth 3 (three) times daily. (Patient not taking: Reported on 03/01/2019) 90 capsule 0   No current facility-administered medications for this visit.     Review of Systems Review  of Systems  Constitutional: Negative.   Respiratory: Negative.   Cardiovascular: Negative.     Blood pressure (!) 143/64, pulse 86, temperature 97.9 F (36.6 C), temperature source Temporal, resp. rate 16, height 5\' 4"  (1.626 m), weight 141 lb 3.2 oz (64 kg), SpO2 97 %.  Physical Exam Physical Exam Exam conducted with a chaperone present.  Constitutional:      Appearance: She is well-developed.  Eyes:     General: No scleral icterus.    Conjunctiva/sclera: Conjunctivae normal.  Neck:     Musculoskeletal: Normal range of motion and neck supple.  Cardiovascular:     Rate and Rhythm: Normal rate and regular rhythm.     Heart sounds: Normal heart sounds.  Pulmonary:     Effort: Pulmonary effort is normal.     Breath sounds: Normal breath sounds.  Chest:     Breasts:        Right: No inverted nipple, mass, nipple discharge, skin change or tenderness.        Left: Absent.  Lymphadenopathy:     Cervical: No cervical adenopathy.     Upper Body:     Right upper body: No supraclavicular or axillary adenopathy.     Left upper body: No supraclavicular or axillary adenopathy.  Skin:    General: Skin is warm and dry.  Neurological:     Mental Status: She is alert and oriented to person, place, and time.  Psychiatric:        Behavior: Behavior normal.     Data Reviewed February 28, 2019 right breast screening mammogram was independently reviewed.  There was a small area of microcalcifications, but on review with Shon Hale, MD from radiology there had been no interval change.  BI-RADS-1  December 07, 2017 bone density test showed the AP spine normal,  stable osteopenia of the femur.  Dr. Mike Gip has been ordering the patient's bone density.  The next one is due April 2021.  Assessment No evidence of recurrent cancer.    Plan  The patient is aware to call back for any questions or new concerns.  She will continue on letrozole for the present.  Continue present calcium supplement.     The patient has been asked to return to the office in one year with a unilateral right breast screening mammogram.  (Patient request).  A prescription for new breast prosthesis and surgical bras provided.  HPI, Physical Exam, Assessment and Plan have been scribed under the direction and in the presence of Robert Bellow, MD. Jonnie Finner, CMA HPI, assessment, plan and physical exam has been scribed under the direction and in the presence of Robert Bellow, MD. Karie Fetch, RN  Forest Gleason Neyla Gauntt 03/03/2019, 6:27 PM

## 2019-03-02 NOTE — Telephone Encounter (Signed)
-----   Message from Lequita Asal, MD sent at 03/02/2019  6:00 AM EDT ----- Regarding: Please call patient  CA27.29 slightly elevated.  Unclear significance.  Recheck in 1 month.  M ----- Message ----- From: Buel Ream, [facility] [place] Sent: 03/01/2019   8:49 AM EDT To: Lequita Asal, MD

## 2019-03-02 NOTE — Telephone Encounter (Signed)
-----   Message from Lequita Asal, MD sent at 03/02/2019  6:00 AM EDT ----- Regarding: Please call patient  CA27.29 slightly elevated.  Unclear significance.  Recheck in 1 month.  M ----- Message ----- From: Buel Ream, Lab In [place] Sent: 03/01/2019   8:49 AM EDT To: Lequita Asal, MD

## 2019-03-02 NOTE — Telephone Encounter (Signed)
Informed patient of elevated CA27.29. Advised Dr. Mike Gip would like to recheck in one month. Patient verbalizes understanding and denies any further questions or concerns.

## 2019-03-02 NOTE — Patient Instructions (Addendum)
The patient is aware to call back for any questions or new concerns. The patient has been asked to return to the office in one year with a unilateral right breast screening mammogram.

## 2019-03-03 ENCOUNTER — Other Ambulatory Visit: Payer: Self-pay | Admitting: Internal Medicine

## 2019-03-03 DIAGNOSIS — E034 Atrophy of thyroid (acquired): Secondary | ICD-10-CM

## 2019-03-03 MED ORDER — LEVOTHYROXINE SODIUM 75 MCG PO TABS: ORAL_TABLET | ORAL | 0 refills | Status: DC

## 2019-03-03 MED ORDER — LEVOTHYROXINE SODIUM 75 MCG PO TABS: ORAL_TABLET | ORAL | 2 refills | Status: DC

## 2019-03-03 NOTE — Telephone Encounter (Signed)
Medication Refill - Medication:  levothyroxine (SYNTHROID, LEVOTHROID) 75 MCG tablet Patient would like 90 day for a year  Has the patient contacted their pharmacy? Yes advised to contact. Patient stated she has about 7-8 left.  Preferred Pharmacy (with phone number or street name):  [facility]) - [place], [place] [phone] (Phone) [phone] (Fax)   Agent: Please be advised that RX refills may take up to 3 business days. We ask that you follow-up with your pharmacy.

## 2019-03-03 NOTE — Telephone Encounter (Signed)
refilled 

## 2019-03-21 ENCOUNTER — Other Ambulatory Visit: Payer: Self-pay

## 2019-03-21 ENCOUNTER — Other Ambulatory Visit: Payer: Self-pay | Admitting: Internal Medicine

## 2019-03-21 MED ORDER — METOPROLOL SUCCINATE ER 25 MG PO TB24: 25.0000 mg | ORAL_TABLET | Freq: Every day | ORAL | 1 refills | Status: DC

## 2019-03-27 ENCOUNTER — Other Ambulatory Visit: Payer: Self-pay | Admitting: Internal Medicine

## 2019-03-30 ENCOUNTER — Other Ambulatory Visit: Payer: Self-pay

## 2019-03-30 DIAGNOSIS — M3500 Sicca syndrome, unspecified: Secondary | ICD-10-CM

## 2019-03-31 ENCOUNTER — Other Ambulatory Visit: Payer: Self-pay

## 2019-04-03 ENCOUNTER — Other Ambulatory Visit: Payer: Self-pay

## 2019-04-03 ENCOUNTER — Ambulatory Visit (INDEPENDENT_AMBULATORY_CARE_PROVIDER_SITE_OTHER): Payer: PPO

## 2019-04-03 ENCOUNTER — Encounter: Payer: PPO | Admitting: Internal Medicine

## 2019-04-03 ENCOUNTER — Inpatient Hospital Stay: Payer: PPO | Attending: Hematology and Oncology

## 2019-04-03 DIAGNOSIS — C50912 Malignant neoplasm of unspecified site of left female breast: Secondary | ICD-10-CM | POA: Diagnosis not present

## 2019-04-03 DIAGNOSIS — M3500 Sicca syndrome, unspecified: Secondary | ICD-10-CM

## 2019-04-03 DIAGNOSIS — Z0000 Encounter for general adult medical examination without abnormal findings: Secondary | ICD-10-CM

## 2019-04-03 LAB — CBC WITH DIFFERENTIAL/PLATELET
Abs Immature Granulocytes: 0.03 10*3/uL (ref 0.00–0.07)
Basophils Absolute: 0.1 10*3/uL (ref 0.0–0.1)
Basophils Relative: 2 %
Eosinophils Absolute: 0.2 10*3/uL (ref 0.0–0.5)
Eosinophils Relative: 6 %
HCT: 31.8 % — ABNORMAL LOW (ref 36.0–46.0)
Hemoglobin: 10.8 g/dL — ABNORMAL LOW (ref 12.0–15.0)
Immature Granulocytes: 1 %
Lymphocytes Relative: 33 %
Lymphs Abs: 1 10*3/uL (ref 0.7–4.0)
MCH: 34.2 pg — ABNORMAL HIGH (ref 26.0–34.0)
MCHC: 34 g/dL (ref 30.0–36.0)
MCV: 100.6 fL — ABNORMAL HIGH (ref 80.0–100.0)
Monocytes Absolute: 0.3 10*3/uL (ref 0.1–1.0)
Monocytes Relative: 9 %
Neutro Abs: 1.5 10*3/uL — ABNORMAL LOW (ref 1.7–7.7)
Neutrophils Relative %: 49 %
Platelets: 135 10*3/uL — ABNORMAL LOW (ref 150–400)
RBC: 3.16 MIL/uL — ABNORMAL LOW (ref 3.87–5.11)
RDW: 14.8 % (ref 11.5–15.5)
WBC: 3.1 10*3/uL — ABNORMAL LOW (ref 4.0–10.5)
nRBC: 0 % (ref 0.0–0.2)

## 2019-04-03 NOTE — Patient Instructions (Addendum)
  Kim Santiago ,  Thank you for taking time to come for your Medicare Wellness Visit. I appreciate your ongoing commitment to your health goals. Please review the following plan we discussed and let me know if I can assist you in the future.   These are the goals we discussed: Goals      Patient Stated   . Increase physical activity (pt-stated)     Increase walking up to 4 miles daily as tolerated       This is a list of the screening recommended for you and due dates:  Health Maintenance  Topic Date Due  . Flu Shot  04/08/2019  . Tetanus Vaccine  01/06/2023  . DEXA scan (bone density measurement)  Completed  . Pneumonia vaccines  Completed

## 2019-04-03 NOTE — Progress Notes (Signed)
Subjective:   Kim Santiago is a 84 y.o. female who presents for Medicare Annual (Subsequent) preventive examination.  Review of Systems:  No ROS.  Medicare Wellness Virtual Visit.  Visual/audio telehealth visit, UTA vital signs.   See social history for additional risk factors.   Cardiac Risk Factors include: advanced age (>75men, >82 women);hypertension     Objective:     Vitals: There were no vitals taken for this visit.  There is no height or weight on file to calculate BMI.  Advanced Directives 04/03/2019 03/01/2019 12/16/2018 11/07/2018 11/07/2018 11/05/2018 10/14/2018  Does Patient Have a Medical Advance Directive? Yes Yes Yes No;Yes No Yes Yes  Type of Paramedic of [place];Living will [place];Living will;Out of facility DNR (pink MOST or yellow form) [place];Living will;Out of facility DNR (pink MOST or yellow form) [place];Living will [place];Living will [place];Living will [place];Living will  Does patient want to make changes to medical advance directive? No - Patient declined - - No - Patient declined - - -  Copy of [place] in Chart? Yes - validated most recent copy scanned in chart (See row information) Yes - validated most recent copy scanned in chart (See row information) Yes - validated most recent copy scanned in chart (See row information) No - copy requested No - copy requested - -  Would patient like information on creating a medical advance directive? - - - No - Patient declined No - Patient declined - -    Tobacco Social History   Tobacco Use  Smoking Status Never Smoker  Smokeless Tobacco Never Used     Counseling given: Not Answered   Clinical Intake:  Pre-visit preparation completed: Yes        Diabetes: No  How often do you need to have someone help you when you read  instructions, pamphlets, or other written materials from your doctor or pharmacy?: 1 - Never  Interpreter Needed?: No     Past Medical History:  Diagnosis Date  . Arthritis   . Breast cancer ([place]) 2017   left mastectomy done 11/2015  . Breast cancer in female [facility]) 11/18/2015   Left: 3.9 cm tumor, T2, 1/2 sentinel nodes positive for macro metastatic disease, N1, 3 negative nodes in the axillary tail, ER+,PR+, Her 2 neu, low Mammoprint score  . Cancer [facility])    thyroid takes levothyroxine  . Chronic kidney disease    UTI  . Genetic screening March 2017.   Mammoprint of left breast cancer: Low risk for recurrence.   . Hypertension   . hypothyroidism    secondary to thyroidectomy for thyroid ca  . Hypothyroidism   . Lupus (HCC)    subcutaneous  . Menopause 40s   natural, hot flashes and mood lability now gone, off prempro 7 months  . Myocardial infarction ([place]) 2013  . Osteoporosis    Osteopenia  . Rosacea   . Sjoegren syndrome   . Stress-induced cardiomyopathy September of 2013   EF 35%. Peak troponin was 1.8.   Past Surgical History:  Procedure Laterality Date  . BACK SURGERY    . BREAST BIOPSY Left 10/30/15   positive, done in Dr. Dwyane Luo office  . CARDIAC CATHETERIZATION  05/2012   [facility]. No significant CAD. Ejection fraction of 35% due to stress-induced cardiomyopathy.  . CHOLECYSTECTOMY    . COLONOSCOPY    . DILATION AND CURETTAGE  OF UTERUS    . KYPHOSIS SURGERY  Feb 2008   L1, Dr. Mauri Pole  . LUMBAR DISC SURGERY     L4-L5  . MASTECTOMY Left 11/18/2015   positive  . SENTINEL NODE BIOPSY Left 11/18/2015   Procedure: SENTINEL NODE BIOPSY;  Surgeon: Robert Bellow, MD;  Location: [facility] ORS;  Service: General;  Laterality: Left;  . SHOULDER ARTHROSCOPY  2004   Left, Dr. Jefm Bryant  . SIMPLE MASTECTOMY WITH AXILLARY SENTINEL NODE BIOPSY Left 11/18/2015   Procedure: SIMPLE MASTECTOMY;  Surgeon: Robert Bellow, MD;  Location: [facility] ORS;  Service: General;  Laterality:  Left;  . SPINE SURGERY     L4-5 diskectomy  . THYROIDECTOMY     Thyroid Cancer  . TONSILLECTOMY    . TUBAL LIGATION     Family History  Problem Relation Age of Onset  . COPD Father   . Cancer Father        esophageal  . Kidney disease Mother   . Heart disease Mother   . Kidney disease Sister   . Cancer Brother 66       colon cancer (both brothers)  . Heart attack Brother 90  . Heart disease Brother   . Breast cancer Neg Hx    Social History   Socioeconomic History  . Marital status: Widowed    Spouse name: Not on file  . Number of children: 1  . Years of education: college  . Highest education level: Not on file  Occupational History  . Not on file  Social Needs  . Financial resource strain: Not hard at all  . Food insecurity    Worry: Never true    Inability: Never true  . Transportation needs    Medical: No    Non-medical: No  Tobacco Use  . Smoking status: Never Smoker  . Smokeless tobacco: Never Used  Substance and Sexual Activity  . Alcohol use: No  . Drug use: No  . Sexual activity: Never  Lifestyle  . Physical activity    Days per week: 3 days    Minutes per session: 30 min  . Stress: Not at all  Relationships  . Social Herbalist on phone: Not on file    Gets together: Not on file    Attends religious service: Not on file    Active member of club or organization: Not on file    Attends meetings of clubs or organizations: Not on file    Relationship status: Widowed  Other Topics Concern  . Not on file  Social History Narrative   Has 1 adopted daughter.    Outpatient Encounter Medications as of 04/03/2019  Medication Sig  . aspirin EC 81 MG EC tablet Take 1 tablet (81 mg total) by mouth daily.  Marland Kitchen atorvastatin (LIPITOR) 20 MG tablet TAKE 1 TABLET BY MOUTH DAILY AT 6PM  . Calcium Carbonate-Vitamin D (CALCIUM + D) 600-200 MG-UNIT TABS Take 2 tablets by mouth daily.   . cholecalciferol (VITAMIN D) 1000 UNITS tablet Take 2,000 Units by  mouth daily.   Marland Kitchen CRANBERRY PO Take 1 capsule by mouth 2 (two) times daily.  . furosemide (LASIX) 20 MG tablet 1 tablet every other day as needed  . letrozole (FEMARA) 2.5 MG tablet Take 1 tablet (2.5 mg total) by mouth daily.  Marland Kitchen levothyroxine (SYNTHROID) 75 MCG tablet Take 1 tablet by mouth (3mcg total) daily  . losartan (COZAAR) 50 MG tablet Take 1 tablet by mouth once  daily  . metolazone (ZAROXOLYN) 2.5 MG tablet TAKE 1 TABLET (2.5 MG TOTAL) BY MOUTH DAILY. 30 MINUTES BEFORE FUROSEMIDE DOSE  . metoprolol succinate (TOPROL-XL) 25 MG 24 hr tablet Take 1 tablet (25 mg total) by mouth daily.  . mirtazapine (REMERON) 7.5 MG tablet Take 1 tablet by mouth at bedtime  . nitroGLYCERIN (NITROSTAT) 0.4 MG SL tablet Place 1 tablet (0.4 mg total) under the tongue every 5 (five) minutes as needed for chest pain. Maximum 3 doses  . omeprazole (PRILOSEC) 20 MG capsule Take 1 capsule by mouth twice a day before a meal  . ondansetron (ZOFRAN ODT) 4 MG disintegrating tablet Take 1 tablet (4 mg total) by mouth every 8 (eight) hours as needed for nausea or vomiting.  . polyethylene glycol (MIRALAX / GLYCOLAX) packet Take 17 g by mouth daily.  . Probiotic Product (PROBIOTIC PO) Take 1 tablet by mouth daily.  . sertraline (ZOLOFT) 25 MG tablet TAKE 1 TABLET BY MOUTH EVERY DAY  . sodium fluoride (DENTA 5000 PLUS) 1.1 % CREA dental cream Take 1 application by mouth daily.  . tizanidine (ZANAFLEX) 2 MG capsule Take 1 capsule (2 mg total) by mouth 3 (three) times daily. (Patient not taking: Reported on 03/01/2019)  . traMADol (ULTRAM) 50 MG tablet Take 1 tablet (50 mg total) by mouth every 8 (eight) hours as needed.  . triamcinolone ointment (KENALOG) 0.1 % Apply 1 application topically daily.   No facility-administered encounter medications on file as of 04/03/2019.     Activities of Daily Living In your present state of health, do you have any difficulty performing the following activities: 04/03/2019 11/07/2018   Hearing? Y N  Comment Difficulty hearing. She does not wear hearing aids. -  Vision? N N  Difficulty concentrating or making decisions? N N  Comment Age appropriate -  Walking or climbing stairs? Y N  Dressing or bathing? N N  Doing errands, shopping? N N  Preparing Food and eating ? N -  Using the Toilet? N -  In the past six months, have you accidently leaked urine? N -  Comment Managed with daily liner -  Do you have problems with loss of bowel control? N -  Managing your Medications? N -  Managing your Finances? N -  Housekeeping or managing your Housekeeping? N -  Some recent data might be hidden    Patient Care Team: [name], MD as PCP - General (Internal Medicine) [name], MD (Internal Medicine) Bary Castilla Forest Gleason, MD (General Surgery) Oneta Rack, MD (Dermatology)    Assessment:   This is a routine wellness examination for Jaymee.  I connected with patient 04/03/19 at  9:30 AM EDT by an audio enabled telemedicine application and verified that I am speaking with the correct person using two identifiers. Patient stated full name and DOB. Patient gave permission to continue with virtual visit. Patient's location was at home and Nurse's location was at [place] office.   Patient states she has signed on with [facility].   Keep all routine maintenance scheduled appointments. Next scheduled with pcp 04/06/19 at 11:00 telephone visit.   Health Screenings  Mammogram 3D screen breast uni right - 02/2019 Colonoscopy - 10/2013 Bone Density - 12/2017 Glaucoma -none Hearing -Followed by ENT. Some difficulty hearing conversational tones. She does not wear hearing aids. Appointment scheduled with audiology.  Hemoglobin A1C - 11/2018 (4.9) Cholesterol - 11/2018 Dental- visits every 6 months Vision- visits within the last 12 months.  Social  Alcohol intake - no       Smoking history- never   Smokers in home? none Illicit drug use? none Exercise  - walking 1 mile, 3-4 days per week, 30 minutes Diet - low sodium Sexually Active -never BMI- discussed the importance of a healthy diet, water intake and the benefits of aerobic exercise.  Educational material provided.   Safety  Patient feels safe at home- yes. Lives with son in law and daughter.  Patient does have smoke detectors at home- yes Patient does wear sunscreen or protective clothing when in direct sunlight -yes Patient does wear seat belt when in a moving vehicle -yes Patient drives- yes  [id number]-[id number] precautions and sickness symptoms discussed.   Activities of Daily Living Patient denies needing assistance with: driving, household chores, feeding themselves, getting from bed to chair, getting to the toilet, bathing/showering, dressing, managing money, or preparing meals.  No new identified risk were noted.    Depression Screen Patient denies losing interest in daily life, feeling hopeless, or crying easily over simple problems.   Medication-taking as directed and without issues.   Fall Screen Patient has had no falls since the one her pcp is aware of in the last year, about 3-4 months ago.   Memory Screen Patient is alert.  Correctly identified the president of the [place], season and recall. Patient likes to read, plays solitaire and works crossword puzzles for brain stimulation.  Immunizations The following Immunizations were discussed: Influenza, shingles, pneumonia, and tetanus.   Other Providers Patient Care Team: [name], MD as PCP - General (Internal Medicine) [name], MD (Internal Medicine) Bary Castilla [place] Gleason, MD (General Surgery) Oneta Rack, MD (Dermatology)  Exercise Activities and Dietary recommendations Current Exercise Habits: Home exercise routine, Type of exercise: walking, Time (Minutes): 30, Frequency (Times/Week): 6, Weekly Exercise (Minutes/Week): 180, Intensity: Mild  Goals      Patient Stated   . Increase physical  activity (pt-stated)     Increase walking up to 4 miles daily as tolerated       Fall Risk Fall Risk  04/03/2019 03/02/2019 10/06/2018 03/31/2018 05/11/2017  Falls in the past year? 1 0 - Yes No  Number falls in past yr: 0 - 0 1 -  Comment No falls in the last 3-4 months. She sought medical care. - - - -  Injury with Fall? - - - No -  Comment - - - She missed her seating -  Risk for fall due to : - - - - -  Follow up - - - Falls prevention discussed -  Is the patient's home free of loose throw rugs in walkways, pet beds, electrical cords, etc? yes      Grab bars in the bathroom? yes      Handrails on the stairs? yes      Adequate lighting? yes  Depression Screen PHQ 2/9 Scores 04/03/2019 03/31/2018 05/11/2017 03/30/2017  PHQ - 2 Score 0 0 0 0  PHQ- 9 Score - - - -     Cognitive Function MMSE - Mini Mental State Exam 03/31/2018 03/30/2017 08/12/2015  Orientation to time 5 5 5   Orientation to Place 5 5 5   Registration 3 3 3   Attention/ Calculation 5 5 5   Recall 3 3 3   Language- name 2 objects 2 2 2   Language- repeat 1 1 1   Language- follow 3 step command 3 3 3   Language- read & follow direction 1 1 1   Write a  sentence 1 1 1   Copy design 1 1 1   Total score 30 30 30      6CIT Screen 04/03/2019  What Year? 0 points  What month? 0 points  What time? 0 points  Count back from 20 0 points  Months in reverse 0 points  Repeat phrase 0 points  Total Score 0    Immunization History  Administered Date(s) Administered  . Influenza Split 07/08/2012  . Influenza, High Dose Seasonal PF 06/08/2016, 06/11/2017  . Influenza,inj,Quad PF,6+ Mos 06/08/2013, 05/04/2014, 05/06/2015  . Influenza-Unspecified 05/04/2018  . Pneumococcal Conjugate-13 03/06/2014  . Pneumococcal Polysaccharide-23 10/04/2018  . Pneumococcal-Unspecified 10/11/2007  . Tdap 01/05/2013  . Zoster 01/12/2012   Screening Tests Health Maintenance  Topic Date Due  . INFLUENZA VACCINE  04/08/2019  . TETANUS/TDAP  01/06/2023   . DEXA SCAN  Completed  . PNA vac Low Risk Adult  Completed      Plan:    End of life planning; Advance aging; Advanced directives discussed.  Copy of current HCPOA/Living Will on file.    I have personally reviewed and noted the following in the patient's chart:   . Medical and social history . Use of alcohol, tobacco or illicit drugs  . Current medications and supplements . Functional ability and status . Nutritional status . Physical activity . Advanced directives . List of other physicians . Hospitalizations, surgeries, and ER visits in previous 12 months . Vitals . Screenings to include cognitive, depression, and falls . Referrals and appointments  In addition, I have reviewed and discussed with patient certain preventive protocols, quality metrics, and best practice recommendations. A written personalized care plan for preventive services as well as general preventive health recommendations were provided to patient.     Varney Biles, LPN  09/25/4172

## 2019-04-04 LAB — CANCER ANTIGEN 27.29: CA 27.29: 35.2 U/mL (ref 0.0–38.6)

## 2019-04-06 ENCOUNTER — Encounter: Payer: Self-pay | Admitting: Internal Medicine

## 2019-04-06 ENCOUNTER — Other Ambulatory Visit: Payer: Self-pay

## 2019-04-06 ENCOUNTER — Telehealth: Payer: Self-pay

## 2019-04-06 ENCOUNTER — Ambulatory Visit (INDEPENDENT_AMBULATORY_CARE_PROVIDER_SITE_OTHER): Payer: PPO | Admitting: Internal Medicine

## 2019-04-06 VITALS — BP 139/57 | HR 74

## 2019-04-06 DIAGNOSIS — F331 Major depressive disorder, recurrent, moderate: Secondary | ICD-10-CM | POA: Diagnosis not present

## 2019-04-06 DIAGNOSIS — E034 Atrophy of thyroid (acquired): Secondary | ICD-10-CM

## 2019-04-06 DIAGNOSIS — I10 Essential (primary) hypertension: Secondary | ICD-10-CM

## 2019-04-06 DIAGNOSIS — F418 Other specified anxiety disorders: Secondary | ICD-10-CM | POA: Diagnosis not present

## 2019-04-06 DIAGNOSIS — E032 Hypothyroidism due to medicaments and other exogenous substances: Secondary | ICD-10-CM | POA: Diagnosis not present

## 2019-04-06 DIAGNOSIS — I69398 Other sequelae of cerebral infarction: Secondary | ICD-10-CM

## 2019-04-06 DIAGNOSIS — R4589 Other symptoms and signs involving emotional state: Secondary | ICD-10-CM

## 2019-04-06 DIAGNOSIS — R2689 Other abnormalities of gait and mobility: Secondary | ICD-10-CM | POA: Diagnosis not present

## 2019-04-06 DIAGNOSIS — N390 Urinary tract infection, site not specified: Secondary | ICD-10-CM | POA: Diagnosis not present

## 2019-04-06 MED ORDER — LOSARTAN POTASSIUM 25 MG PO TABS: 25.0000 mg | ORAL_TABLET | Freq: Every day | ORAL | 1 refills | Status: DC

## 2019-04-06 MED ORDER — FUROSEMIDE 20 MG PO TABS: ORAL_TABLET | ORAL | 3 refills | Status: DC

## 2019-04-06 MED ORDER — LEVOTHYROXINE SODIUM 75 MCG PO TABS: ORAL_TABLET | ORAL | 2 refills | Status: DC

## 2019-04-06 NOTE — Progress Notes (Signed)
Telephone Visit   This visit type was conducted due to national recommendations for restrictions regarding the COVID-19 pandemic (e.g. social distancing).  This format is felt to be most appropriate for this patient at this time.  All issues noted in this document were discussed and addressed.  No physical exam was performed (except for noted visual exam findings with Video Visits).   I connected with@ on 04/06/19 at 11:00 AM EDT by telephone and verified that I am speaking with the correct person using two identifiers. Location patient: home Location provider: work or home office Persons participating in the virtual visit: patient, provider  I discussed the limitations, risks, security and privacy concerns of performing an evaluation and management service by telephone and the availability of in person appointments. I also discussed with the patient that there may be a patient responsible charge related to this service. The patient expressed understanding and agreed to proceed.  Reason for visit: follow up   HPI:  84 yr old female with history of residual balance deficits secondary to CVA in   Recurrent major depressive disorder without psychosis , ,Sjogren's Syndrome  recurrent UTI,  Discoid LUPUS, breast cancer diagnosed in 2017 and CAD with first degree AV block, normal cath 2013   Since her last visit she has completed her transition from living independently living  With her  daughter,  She states that she is slowly adjusting to the change and enjoying the  semi suite but the COVID 19 pandemic restrictions on her social life are frustrating.   No UTI's since starting  Cranberry tablets RECOMMENDED BY UROLOGY .  Has signed up with [facility] in the hopes that they will be able to treat her UTis at home.  Discussed the limitations of the service.   Taking zoloft and remeron and feeling better   Taking losartan for hypertension dose was   lowered to 25 mg  Due to recurrent syncope.  bp  readings at home have been 137/68,  139/57  , 130/57  Discoid lupus acting up .  No itching but rash is "ugly" using triamcinolone bid dur to multiple drug intolerances.     Has purpura , bothered by appearance of purpura    The patient has no signs or symptoms of COVID 19 infection (fever, cough, sore throat  or shortness of breath beyond what is typical for patient).  Patient denies contact with other persons with the above mentioned symptoms or with anyone confirmed to have COVID 19    ROS: See pertinent positives and negatives per HPI.  Past Medical History:  Diagnosis Date  . Arthritis   . Breast cancer ([place]) 2017   left mastectomy done 11/2015  . Breast cancer in female [facility]) 11/18/2015   Left: 3.9 cm tumor, T2, 1/2 sentinel nodes positive for macro metastatic disease, N1, 3 negative nodes in the axillary tail, ER+,PR+, Her 2 neu, low Mammoprint score  . Cancer [facility])    thyroid takes levothyroxine  . Chronic kidney disease    UTI  . Genetic screening March 2017.   Mammoprint of left breast cancer: Low risk for recurrence.   Marland Kitchen History of heart attack 06/12/2014  . Hypertension   . hypothyroidism    secondary to thyroidectomy for thyroid ca  . Hypothyroidism   . Lupus (HCC)    subcutaneous  . Menopause 40s   natural, hot flashes and mood lability now gone, off prempro 7 months  . Myocardial infarction ([place]) 2013  . Osteoporosis  Osteopenia  . Rosacea   . Sjoegren syndrome   . Stress-induced cardiomyopathy September of 2013   EF 35%. Peak troponin was 1.8.    Past Surgical History:  Procedure Laterality Date  . BACK SURGERY    . BREAST BIOPSY Left 10/30/15   positive, done in Dr. Dwyane Luo office  . CARDIAC CATHETERIZATION  05/2012   [facility]. No significant CAD. Ejection fraction of 35% due to stress-induced cardiomyopathy.  . CHOLECYSTECTOMY    . COLONOSCOPY    . DILATION AND CURETTAGE OF UTERUS    . KYPHOSIS SURGERY  Feb 2008   L1, Dr. Mauri Pole  . LUMBAR DISC  SURGERY     L4-L5  . MASTECTOMY Left 11/18/2015   positive  . SENTINEL NODE BIOPSY Left 11/18/2015   Procedure: SENTINEL NODE BIOPSY;  Surgeon: Robert Bellow, MD;  Location: [facility] ORS;  Service: General;  Laterality: Left;  . SHOULDER ARTHROSCOPY  2004   Left, Dr. Jefm Bryant  . SIMPLE MASTECTOMY WITH AXILLARY SENTINEL NODE BIOPSY Left 11/18/2015   Procedure: SIMPLE MASTECTOMY;  Surgeon: Robert Bellow, MD;  Location: [facility] ORS;  Service: General;  Laterality: Left;  . SPINE SURGERY     L4-5 diskectomy  . THYROIDECTOMY     Thyroid Cancer  . TONSILLECTOMY    . TUBAL LIGATION      Family History  Problem Relation Age of Onset  . COPD Father   . Cancer Father        esophageal  . Kidney disease Mother   . Heart disease Mother   . Kidney disease Sister   . Cancer Brother 33       colon cancer (both brothers)  . Heart attack Brother 102  . Heart disease Brother   . Breast cancer Neg Hx     SOCIAL HX: IADLS but living with daughter due to frequent falls since her CVA in March 2020    Current Outpatient Medications:  .  aspirin EC 81 MG EC tablet, Take 1 tablet (81 mg total) by mouth daily., Disp: 180 tablet, Rfl: 0 .  atorvastatin (LIPITOR) 20 MG tablet, TAKE 1 TABLET BY MOUTH DAILY AT 6PM, Disp: 90 tablet, Rfl: 1 .  Calcium Carbonate-Vitamin D (CALCIUM + D) 600-200 MG-UNIT TABS, Take 2 tablets by mouth daily. , Disp: , Rfl:  .  cholecalciferol (VITAMIN D) 1000 UNITS tablet, Take 2,000 Units by mouth daily. , Disp: , Rfl:  .  CRANBERRY PO, Take 1 capsule by mouth 2 (two) times daily., Disp: , Rfl:  .  furosemide (LASIX) 20 MG tablet, 1 tablet every other day as needed, Disp: 30 tablet, Rfl: 3 .  letrozole (FEMARA) 2.5 MG tablet, Take 1 tablet (2.5 mg total) by mouth daily., Disp: 90 tablet, Rfl: 3 .  levothyroxine (SYNTHROID) 75 MCG tablet, Take 1 tablet by mouth (41mcg total) daily, Disp: 90 tablet, Rfl: 2 .  losartan (COZAAR) 25 MG tablet, Take 1 tablet (25 mg total) by mouth  daily., Disp: 90 tablet, Rfl: 1 .  metoprolol succinate (TOPROL-XL) 25 MG 24 hr tablet, Take 1 tablet (25 mg total) by mouth daily., Disp: 90 tablet, Rfl: 1 .  mirtazapine (REMERON) 7.5 MG tablet, Take 1 tablet by mouth at bedtime, Disp: 90 tablet, Rfl: 1 .  nitroGLYCERIN (NITROSTAT) 0.4 MG SL tablet, Place 1 tablet (0.4 mg total) under the tongue every 5 (five) minutes as needed for chest pain. Maximum 3 doses, Disp: 50 tablet, Rfl: 3 .  omeprazole (PRILOSEC) 20 MG capsule,  Take 1 capsule by mouth twice a day before a meal, Disp: 180 capsule, Rfl: 0 .  ondansetron (ZOFRAN ODT) 4 MG disintegrating tablet, Take 1 tablet (4 mg total) by mouth every 8 (eight) hours as needed for nausea or vomiting., Disp: 20 tablet, Rfl: 0 .  polyethylene glycol (MIRALAX / GLYCOLAX) packet, Take 17 g by mouth daily., Disp: , Rfl:  .  Probiotic Product (PROBIOTIC PO), Take 1 tablet by mouth daily., Disp: , Rfl:  .  sertraline (ZOLOFT) 25 MG tablet, TAKE 1 TABLET BY MOUTH EVERY DAY, Disp: 90 tablet, Rfl: 1 .  sodium fluoride (DENTA 5000 PLUS) 1.1 % CREA dental cream, Take 1 application by mouth daily., Disp: , Rfl:  .  tizanidine (ZANAFLEX) 2 MG capsule, Take 1 capsule (2 mg total) by mouth 3 (three) times daily., Disp: 90 capsule, Rfl: 0 .  traMADol (ULTRAM) 50 MG tablet, Take 1 tablet (50 mg total) by mouth every 8 (eight) hours as needed., Disp: 60 tablet, Rfl: 2 .  triamcinolone ointment (KENALOG) 0.1 %, Apply 1 application topically daily., Disp: , Rfl: 0  EXAM:  VITALS per patient if applicable:  GENERAL: alert, oriented, appears well and in no acute distress  HEENT: atraumatic, conjunttiva clear, no obvious abnormalities on inspection of external nose and ears  NECK: normal movements of the head and neck  LUNGS: on inspection no signs of respiratory distress, breathing rate appears normal, no obvious gross SOB, gasping or wheezing  CV: no obvious cyanosis  MS: moves all visible extremities without  noticeable abnormality  PSYCH/NEURO: pleasant and cooperative, no obvious depression or anxiety, speech and thought processing grossly intact  ASSESSMENT AND PLAN:   Anxiety about health Continue 25 mg sertraline,  Continue  remeron at low dose for insomnia.   Major depressive disorder, recurrent episode, moderate ([place]) She is sleeping better and feeling better on current dose of Remeron . She has resumed Zoloft but at the 25  mg dose due to recurrent nausea at higher doses   Impaired balance as late effect of cerebrovascular accident PT has been completed and she is using a forearm crutch for mobility.  Handicapped application signed for parking permit . Now living semi independently with daughter   Essential hypertension Losartan  dose was reduced due to orthostasis and syncope  Home readsings are now 923-300 systolic  Recurrent UTI No recurrence in several moths sine starting cranberry tablets.  She has a Standing order for testing with symptoms due to recurrence causing encephalopathy   Iatrogenic hypothyroidism Thyroid function is WNL on current dose.  No current changes needed.   Lab Results  Component Value Date   TSH 0.961 03/01/2019       I discussed the assessment and treatment plan with the patient. The patient was provided an opportunity to ask questions and all were answered. The patient agreed with the plan and demonstrated an understanding of the instructions.   The patient was advised to call back or seek an in-person evaluation if the symptoms worsen or if the condition fails to improve as anticipated.  I provided 40 minutes of non-face-to-face time during this encounter.   [name], MD

## 2019-04-06 NOTE — Telephone Encounter (Signed)
-----   Message from Secundino Ginger sent at 04/06/2019  3:44 PM EDT ----- Regarding: Tumor Markers She said she had labs done and would like to know what her tumor Marker is. She said it was a little high.

## 2019-04-06 NOTE — Telephone Encounter (Signed)
Contacted patient and informed her CA 27.29 was WNL. Patient verbalizes understanding and denies any further questions or concerns.

## 2019-04-07 ENCOUNTER — Encounter: Payer: Self-pay | Admitting: General Surgery

## 2019-04-08 ENCOUNTER — Encounter: Payer: Self-pay | Admitting: Internal Medicine

## 2019-04-08 DIAGNOSIS — E034 Atrophy of thyroid (acquired): Secondary | ICD-10-CM | POA: Insufficient documentation

## 2019-04-08 NOTE — Assessment & Plan Note (Signed)
PT has been completed and she is using a forearm crutch for mobility.  Handicapped application signed for parking permit . Now living semi independently with daughter

## 2019-04-08 NOTE — Assessment & Plan Note (Signed)
Thyroid function is WNL on current dose.  No current changes needed.   Lab Results  Component Value Date   TSH 0.961 03/01/2019

## 2019-04-08 NOTE — Assessment & Plan Note (Signed)
Losartan  dose was reduced due to orthostasis and syncope  Home readsings are now 122-583 systolic

## 2019-04-08 NOTE — Assessment & Plan Note (Signed)
She is sleeping better and feeling better on current dose of Remeron . She has resumed Zoloft but at the 25  mg dose due to recurrent nausea at higher doses

## 2019-04-08 NOTE — Assessment & Plan Note (Signed)
No recurrence in several moths sine starting cranberry tablets.  She has a Standing order for testing with symptoms due to recurrence causing encephalopathy

## 2019-04-08 NOTE — Assessment & Plan Note (Addendum)
Continue 25 mg sertraline,  Continue  remeron at low dose for insomnia.

## 2019-04-24 ENCOUNTER — Other Ambulatory Visit: Payer: Self-pay | Admitting: Internal Medicine

## 2019-04-24 MED ORDER — LOSARTAN POTASSIUM 25 MG PO TABS: 25.0000 mg | ORAL_TABLET | Freq: Every day | ORAL | 1 refills | Status: DC

## 2019-04-24 NOTE — Telephone Encounter (Signed)
Pt called in to request a refill for losartan (COZAAR) 25 MG tablet - 90 day supply if possible   Pharmacy: [id number] [place], [place]

## 2019-05-02 ENCOUNTER — Encounter: Payer: PPO | Admitting: Internal Medicine

## 2019-05-02 DIAGNOSIS — C50112 Malignant neoplasm of central portion of left female breast: Secondary | ICD-10-CM | POA: Diagnosis not present

## 2019-05-02 DIAGNOSIS — Z4432 Encounter for fitting and adjustment of external left breast prosthesis: Secondary | ICD-10-CM | POA: Diagnosis not present

## 2019-05-11 DIAGNOSIS — C50112 Malignant neoplasm of central portion of left female breast: Secondary | ICD-10-CM | POA: Diagnosis not present

## 2019-05-11 DIAGNOSIS — Z4432 Encounter for fitting and adjustment of external left breast prosthesis: Secondary | ICD-10-CM | POA: Diagnosis not present

## 2019-05-31 ENCOUNTER — Other Ambulatory Visit: Payer: Self-pay

## 2019-05-31 ENCOUNTER — Telehealth: Payer: Self-pay

## 2019-05-31 ENCOUNTER — Inpatient Hospital Stay: Payer: PPO | Attending: Hematology and Oncology | Admitting: Hematology and Oncology

## 2019-05-31 ENCOUNTER — Other Ambulatory Visit: Payer: Self-pay | Admitting: Internal Medicine

## 2019-05-31 DIAGNOSIS — C50912 Malignant neoplasm of unspecified site of left female breast: Secondary | ICD-10-CM | POA: Diagnosis not present

## 2019-05-31 DIAGNOSIS — Z170 Estrogen receptor positive status [ER+]: Secondary | ICD-10-CM

## 2019-05-31 LAB — CBC WITH DIFFERENTIAL/PLATELET
Abs Immature Granulocytes: 0.04 10*3/uL (ref 0.00–0.07)
Basophils Absolute: 0.1 10*3/uL (ref 0.0–0.1)
Basophils Relative: 2 %
Eosinophils Absolute: 0.2 10*3/uL (ref 0.0–0.5)
Eosinophils Relative: 6 %
HCT: 31.5 % — ABNORMAL LOW (ref 36.0–46.0)
Hemoglobin: 10.5 g/dL — ABNORMAL LOW (ref 12.0–15.0)
Immature Granulocytes: 1 %
Lymphocytes Relative: 31 %
Lymphs Abs: 1 10*3/uL (ref 0.7–4.0)
MCH: 33.2 pg (ref 26.0–34.0)
MCHC: 33.3 g/dL (ref 30.0–36.0)
MCV: 99.7 fL (ref 80.0–100.0)
Monocytes Absolute: 0.4 10*3/uL (ref 0.1–1.0)
Monocytes Relative: 12 %
Neutro Abs: 1.5 10*3/uL — ABNORMAL LOW (ref 1.7–7.7)
Neutrophils Relative %: 48 %
Platelets: 120 10*3/uL — ABNORMAL LOW (ref 150–400)
RBC: 3.16 MIL/uL — ABNORMAL LOW (ref 3.87–5.11)
RDW: 15.2 % (ref 11.5–15.5)
WBC: 3.1 10*3/uL — ABNORMAL LOW (ref 4.0–10.5)
nRBC: 0 % (ref 0.0–0.2)

## 2019-05-31 LAB — COMPREHENSIVE METABOLIC PANEL
ALT: 9 U/L (ref 0–44)
AST: 21 U/L (ref 15–41)
Albumin: 3.6 g/dL (ref 3.5–5.0)
Alkaline Phosphatase: 90 U/L (ref 38–126)
Anion gap: 8 (ref 5–15)
BUN: 14 mg/dL (ref 8–23)
CO2: 27 mmol/L (ref 22–32)
Calcium: 8.3 mg/dL — ABNORMAL LOW (ref 8.9–10.3)
Chloride: 100 mmol/L (ref 98–111)
Creatinine, Ser: 0.69 mg/dL (ref 0.44–1.00)
GFR calc Af Amer: >60 mL/min (ref >60)
GFR calc non Af Amer: >60 mL/min (ref >60)
Glucose, Bld: 102 mg/dL — ABNORMAL HIGH (ref 70–99)
Potassium: 4.3 mmol/L (ref 3.5–5.1)
Sodium: 135 mmol/L (ref 135–145)
Total Bilirubin: 0.4 mg/dL (ref 0.3–1.2)
Total Protein: 6.4 g/dL — ABNORMAL LOW (ref 6.5–8.1)

## 2019-05-31 NOTE — Telephone Encounter (Signed)
Left a message to inform the patient that her calcium was low and and to see if she is currently taking calcium.I left the phone number for the patient to return the call.

## 2019-05-31 NOTE — Telephone Encounter (Signed)
-----   Message from Lequita Asal, MD sent at 05/31/2019 10:28 AM EDT ----- Regarding: Please call patient  Calcium is a little low.  Is she taking calcium?  M ----- Message ----- From: Buel Ream, Lab In [place] Sent: 05/31/2019  10:19 AM EDT To: Lequita Asal, MD

## 2019-06-01 LAB — CANCER ANTIGEN 27.29: CA 27.29: 34 U/mL (ref 0.0–38.6)

## 2019-06-24 ENCOUNTER — Other Ambulatory Visit: Payer: Self-pay | Admitting: Internal Medicine

## 2019-06-24 DIAGNOSIS — E034 Atrophy of thyroid (acquired): Secondary | ICD-10-CM

## 2019-07-11 ENCOUNTER — Other Ambulatory Visit: Payer: Self-pay

## 2019-07-11 MED ORDER — ATORVASTATIN CALCIUM 20 MG PO TABS: ORAL_TABLET | ORAL | 1 refills | Status: DC

## 2019-07-12 ENCOUNTER — Other Ambulatory Visit: Payer: Self-pay

## 2019-07-14 ENCOUNTER — Other Ambulatory Visit: Payer: Self-pay

## 2019-07-14 ENCOUNTER — Encounter: Payer: Self-pay | Admitting: Internal Medicine

## 2019-07-14 ENCOUNTER — Ambulatory Visit (INDEPENDENT_AMBULATORY_CARE_PROVIDER_SITE_OTHER): Payer: PPO | Admitting: Internal Medicine

## 2019-07-14 VITALS — BP 140/78 | HR 82 | Temp 96.9°F | Ht 63.78 in | Wt 146.0 lb

## 2019-07-14 DIAGNOSIS — D692 Other nonthrombocytopenic purpura: Secondary | ICD-10-CM | POA: Diagnosis not present

## 2019-07-14 DIAGNOSIS — I10 Essential (primary) hypertension: Secondary | ICD-10-CM

## 2019-07-14 DIAGNOSIS — D72819 Decreased white blood cell count, unspecified: Secondary | ICD-10-CM | POA: Diagnosis not present

## 2019-07-14 DIAGNOSIS — E782 Mixed hyperlipidemia: Secondary | ICD-10-CM

## 2019-07-14 DIAGNOSIS — D649 Anemia, unspecified: Secondary | ICD-10-CM

## 2019-07-14 DIAGNOSIS — Z0000 Encounter for general adult medical examination without abnormal findings: Secondary | ICD-10-CM | POA: Diagnosis not present

## 2019-07-14 DIAGNOSIS — Z974 Presence of external hearing-aid: Secondary | ICD-10-CM

## 2019-07-14 DIAGNOSIS — F331 Major depressive disorder, recurrent, moderate: Secondary | ICD-10-CM | POA: Diagnosis not present

## 2019-07-14 DIAGNOSIS — E032 Hypothyroidism due to medicaments and other exogenous substances: Secondary | ICD-10-CM | POA: Diagnosis not present

## 2019-07-14 MED ORDER — METOLAZONE 2.5 MG PO TABS: 2.5000 mg | ORAL_TABLET | Freq: Every day | ORAL | 1 refills | Status: DC

## 2019-07-14 NOTE — Patient Instructions (Addendum)
I want you to increase your sertraline to 50 mg daily after dinner for your anxiety  You can try the purpura pills . They may help,  But the condition is a result of using aspirin and triamcinolone  You can suspend the Mirtazapine since your appetite is fine  Add the metolazone 30 minutes prior to furosemide dose for the fluid retention   The ShingRx vaccine is now available in local pharmacies and is much more protective than the old one  Zostavax  (it is about 97%  Effective in preventing shingles). .   It is therefore ADVISED for all interested adults over 50 to prevent shingles so I have printed you a prescription for it.  (it requires a 2nd dose 2 to 6 months after the first one) .  It will cause you to have flu  like symptoms for 2 days If your pharmacy is not available to give it to you,  The [facility] will give it to you.  They are located on the ground floor of the Center Maintenance After Age 79 After age 51, you are at a higher risk for certain long-term diseases and infections as well as injuries from falls. Falls are a major cause of broken bones and head injuries in people who are older than age 77. Getting regular preventive care can help to keep you healthy and well. Preventive care includes getting regular testing and making lifestyle changes as recommended by your health care provider. Talk with your health care provider about:  Which screenings and tests you should have. A screening is a test that checks for a disease when you have no symptoms.  A diet and exercise plan that is right for you. What should I know about screenings and tests to prevent falls? Screening and testing are the best ways to find a health problem early. Early diagnosis and treatment give you the best chance of managing medical conditions that are common after age 21. Certain conditions and lifestyle choices may make you more likely to have a fall. Your health  care provider may recommend:  Regular vision checks. Poor vision and conditions such as cataracts can make you more likely to have a fall. If you wear glasses, make sure to get your prescription updated if your vision changes.  Medicine review. Work with your health care provider to regularly review all of the medicines you are taking, including over-the-counter medicines. Ask your health care provider about any side effects that may make you more likely to have a fall. Tell your health care provider if any medicines that you take make you feel dizzy or sleepy.  Osteoporosis screening. Osteoporosis is a condition that causes the bones to get weaker. This can make the bones weak and cause them to break more easily.  Blood pressure screening. Blood pressure changes and medicines to control blood pressure can make you feel dizzy.  Strength and balance checks. Your health care provider may recommend certain tests to check your strength and balance while standing, walking, or changing positions.  Foot health exam. Foot pain and numbness, as well as not wearing proper footwear, can make you more likely to have a fall.  Depression screening. You may be more likely to have a fall if you have a fear of falling, feel emotionally low, or feel unable to do activities that you used to do.  Alcohol use screening. Using too much alcohol can affect your balance and may  make you more likely to have a fall. What actions can I take to lower my risk of falls? General instructions  Talk with your health care provider about your risks for falling. Tell your health care provider if: ? You fall. Be sure to tell your health care provider about all falls, even ones that seem minor. ? You feel dizzy, sleepy, or off-balance.  Take over-the-counter and prescription medicines only as told by your health care provider. These include any supplements.  Eat a healthy diet and maintain a healthy weight. A healthy diet  includes low-fat dairy products, low-fat (lean) meats, and fiber from whole grains, beans, and lots of fruits and vegetables. Home safety  Remove any tripping hazards, such as rugs, cords, and clutter.  Install safety equipment such as grab bars in bathrooms and safety rails on stairs.  Keep rooms and walkways well-lit. Activity   Follow a regular exercise program to stay fit. This will help you maintain your balance. Ask your health care provider what types of exercise are appropriate for you.  If you need a cane or walker, use it as recommended by your health care provider.  Wear supportive shoes that have nonskid soles. Lifestyle  Do not drink alcohol if your health care provider tells you not to drink.  If you drink alcohol, limit how much you have: ? 0-1 drink a day for women. ? 0-2 drinks a day for men.  Be aware of how much alcohol is in your drink. In the [place] one drink equals one typical bottle of beer (12 oz), one-half glass of wine (5 oz), or one shot of hard liquor (1 oz).  Do not use any products that contain nicotine or tobacco, such as cigarettes and e-cigarettes. If you need help quitting, ask your health care provider. Summary  Having a healthy lifestyle and getting preventive care can help to protect your health and wellness after age 55.  Screening and testing are the best way to find a health problem early and help you avoid having a fall. Early diagnosis and treatment give you the best chance for managing medical conditions that are more common for people who are older than age 62.  Falls are a major cause of broken bones and head injuries in people who are older than age 21. Take precautions to prevent a fall at home.  Work with your health care provider to learn what changes you can make to improve your health and wellness and to prevent falls. This information is not intended to replace advice given to you by your health care provider. Make sure you  discuss any questions you have with your health care provider. Document Released: 07/07/2017 Document Revised: 12/15/2018 Document Reviewed: 07/07/2017 Elsevier Patient Education  2020 [employer].

## 2019-07-14 NOTE — Progress Notes (Signed)
Patient ID: Kim Santiago, female    DOB: 1934/10/17  Age: 84 y.o. MRN: [id number]  The patient is here for annual preventive examination and management of other chronic and acute problems.    DEXA 2019: OSTEOPENIA WITH HISTOR YOF L1 FRACTURE REMOTE HISTORY OF THYROID  CA REMOTE PRIOR TO 2006 MAMMOGRAM AND BREAST EXAMS BY cANCER cENTER  The risk factors are reflected in the social history.  The roster of all physicians providing medical care to patient - is listed in the Snapshot section of the chart.  Activities of daily living:  The patient is 100% independent in all ADLs: dressing, toileting, feeding as well as independent mobility  Home safety : The patient has smoke detectors in the home. They wear seatbelts.  There are no firearms at home. There is no violence in the home.   There is no risks for hepatitis, STDs or HIV. There is no   history of blood transfusion. They have no travel history to infectious disease endemic areas of the world.  The patient has seen their dentist in the last six month. she has acquired hearing aides,  And is hearing much better  whispered voices and some television programs.  .   Last eye exam May 2020 dr Kim Santiago  They do not  have excessive sun exposure. Discussed the need for sun protection: hats, long sleeves and use of sunscreen if there is significant sun exposure.   Diet: the importance of a healthy diet is discussed. They do have a healthy diet.  The benefits of regular aerobic exercise were discussed. She walks 4 times per week ,  20 minutes.   Depression screen: there are no signs or vegatative symptoms of depression- irritability, change in appetite, anhedonia, sadness/tearfullness. She does have increased anxiety   Cognitive assessment: the patient manages all their financial and personal affairs and is actively engaged. They could relate day,date,year and events; recalled 2/3 objects at 3 minutes; performed clock-face test normally.  The  following portions of the patient's history were reviewed and updated as appropriate: allergies, current medications, past family history, past medical history,  past surgical history, past social history  and problem list.  Visual acuity was not assessed per patient preference since she has regular follow up with her ophthalmologist. Hearing and body mass index were assessed and reviewed.   During the course of the visit the patient was educated and counseled about appropriate screening and preventive services including : fall prevention , diabetes screening, nutrition counseling, colorectal cancer screening, and recommended immunizations.    CC: The primary encounter diagnosis was Mixed hyperlipidemia. Diagnoses of Does use hearing aid, Leukopenia, unspecified type, Iatrogenic hypothyroidism, Essential hypertension, Major depressive disorder, recurrent episode, moderate ([place]), Purpura ([place]), Anemia, unspecified type, and Encounter for preventive health examination were also pertinent to this visit.  1) BOTHERED BY PURPURA covering arms and chest .  Uses triamcinolone frequently to manage discoid lupus  2) BP at home has been [id number] TO [id number] SYSTOLIC  o n losartan 25 mg daily.  Had 2 syncopal episodes on 50 mg losartan dose   3) pitting edema taking lasix daily ,  discussed adding/Using  metolazone   4) GERD aggravated  By eating chocolate not taking prilosec daily just prn   5) no longer taking plavix,  Just asa three times per week  6) Depression/GAD: anxiety worse in the eveinng.  Occasional insonnia  , appetite excellent on Remeron    History Kim Santiago has a past medical history  of Arthritis, Breast cancer ([place]) (2017), Breast cancer in female [facility]) (11/18/2015), Cancer [facility]), Chronic kidney disease, Genetic screening (March 2017.), History of heart attack (06/12/2014), Hypertension, hypothyroidism, Hypothyroidism, Lupus ([place]), Menopause (82s), Myocardial infarction ([place]) (2013), Osteoporosis,  Rosacea, Sjoegren syndrome, and Stress-induced cardiomyopathy (September of 2013).   She has a past surgical history that includes Kyphosis surgery (Feb 2008); Cholecystectomy; Tonsillectomy; Tubal ligation; Lumbar disc surgery; Shoulder arthroscopy (2004); Thyroidectomy; Spine surgery; Cardiac catheterization (05/2012); Dilation and curettage of uterus; Back surgery; Simple mastectomy with axillary sentinel node biopsy (Left, 11/18/2015); Sentinel node biopsy (Left, 11/18/2015); Colonoscopy; Breast biopsy (Left, 10/30/15); and Mastectomy (Left, 11/18/2015).   Her family history includes COPD in her father; Cancer in her father; Cancer (age of onset: 68) in her brother; Heart attack (age of onset: 52) in her brother; Heart disease in her brother and mother; Kidney disease in her mother and sister.She reports that she has never smoked. She has never used smokeless tobacco. She reports that she does not drink alcohol or use drugs.  Outpatient Medications Prior to Visit  Medication Sig Dispense Refill  . aspirin EC 81 MG EC tablet Take 1 tablet (81 mg total) by mouth daily. 180 tablet 0  . atorvastatin (LIPITOR) 20 MG tablet TAKE 1 TABLET BY MOUTH DAILY AT 6PM 90 tablet 1  . Calcium Carbonate-Vitamin D (CALCIUM + D) 600-200 MG-UNIT TABS Take 2 tablets by mouth daily.     . cholecalciferol (VITAMIN D) 1000 UNITS tablet Take 2,000 Units by mouth daily.     Marland Kitchen CRANBERRY PO Take 1 capsule by mouth 2 (two) times daily.    . furosemide (LASIX) 20 MG tablet 1 tablet every other day as needed 30 tablet 3  . letrozole (FEMARA) 2.5 MG tablet Take 1 tablet (2.5 mg total) by mouth daily. 90 tablet 3  . levothyroxine (SYNTHROID) 75 MCG tablet TAKE 1 TABLET BY MOUTH (75MCG TOTAL) DAILY 90 tablet 2  . losartan (COZAAR) 25 MG tablet Take 1 tablet (25 mg total) by mouth daily. 90 tablet 1  . metoprolol succinate (TOPROL-XL) 25 MG 24 hr tablet Take 1 tablet by mouth every day 90 tablet 3  . nitroGLYCERIN (NITROSTAT) 0.4 MG  SL tablet Place 1 tablet (0.4 mg total) under the tongue every 5 (five) minutes as needed for chest pain. Maximum 3 doses 50 tablet 3  . omeprazole (PRILOSEC) 20 MG capsule Take 1 capsule by mouth twice a day before a meal 180 capsule 0  . ondansetron (ZOFRAN ODT) 4 MG disintegrating tablet Take 1 tablet (4 mg total) by mouth every 8 (eight) hours as needed for nausea or vomiting. 20 tablet 0  . polyethylene glycol (MIRALAX / GLYCOLAX) packet Take 17 g by mouth daily.    . Probiotic Product (PROBIOTIC PO) Take 1 tablet by mouth daily.    . sertraline (ZOLOFT) 25 MG tablet TAKE 1 TABLET BY MOUTH EVERY DAY 90 tablet 1  . sodium fluoride (DENTA 5000 PLUS) 1.1 % CREA dental cream Take 1 application by mouth daily.    . tizanidine (ZANAFLEX) 2 MG capsule Take 1 capsule (2 mg total) by mouth 3 (three) times daily. 90 capsule 0  . traMADol (ULTRAM) 50 MG tablet Take 1 tablet (50 mg total) by mouth every 8 (eight) hours as needed. 60 tablet 2  . triamcinolone ointment (KENALOG) 0.1 % Apply 1 application topically daily.  0  . mirtazapine (REMERON) 7.5 MG tablet Take 1 tablet by mouth at bedtime 90 tablet 1  No facility-administered medications prior to visit.     Review of Systems  Patient denies headache, fevers, malaise, unintentional weight loss, , eye pain, sinus congestion and sinus pain, sore throat, dysphagia,  hemoptysis , cough, dyspnea, wheezing, chest pain, palpitations, orthopnea,  abdominal pain, nausea, melena, diarrhea, constipation, flank pain, dysuria, hematuria, urinary  Frequency, nocturia, numbness, tingling, seizures,  Focal weakness, Loss of consciousness,  Tremor, insomnia, depression, anxiety, and suicidal ideation.     Objective:  BP 140/78 (BP Location: Right Arm, Patient Position: Sitting, Cuff Size: Normal)   Pulse 82   Temp (!) 96.9 F (36.1 C) (Temporal)   Ht 5' 3.78" (1.62 m)   Wt 146 lb (66.2 kg)   SpO2 98%   BMI 25.23 kg/m   Physical Exam  General  appearance: alert, cooperative and appears stated age Ears: normal TM's and external ear canals both ears Throat: lips, mucosa, and tongue normal; teeth and gums normal Neck: no adenopathy, no carotid bruit, supple, symmetrical, trachea midline and thyroid not enlarged, symmetric, no tenderness/mass/nodules Back: symmetric, no curvature. ROM normal. No CVA tenderness. Lungs: clear to auscultation bilaterally Heart: regular rate and rhythm, S1, S2 normal, no murmur, click, rub or gallop Abdomen: soft, non-tender; bowel sounds normal; no masses,  no organomegaly Pulses: 2+ and symmetric Skin: multiple diffuse purpura covering arms and chest wall .   rashes or lesions Lymph nodes: Cervical, supraclavicular, and axillary nodes normal.  Assessment & Plan:   Problem List Items Addressed This Visit      Unprioritized   Purpura ([place])    recurrent finding,  In the setting of drop in platelets..repeat level today is stable  Lab Results  Component Value Date   WBC 3.3 (L) 07/14/2019   HGB 10.6 (L) 07/14/2019   HCT 30.7 (L) 07/14/2019   MCV 95.9 07/14/2019   PLT 138 (L) 07/14/2019         Essential hypertension    Well controlled on current regimen. Renal function stable, no changes today.      Relevant Medications   metolazone (ZAROXOLYN) 2.5 MG tablet   Other Relevant Orders   Comprehensive metabolic panel (Completed)   Iatrogenic hypothyroidism    Goal is TSH 10  No changes today Lab Results  Component Value Date   TSH 0.59 07/14/2019         Relevant Orders   TSH (Completed)   Major depressive disorder, recurrent episode, moderate ([place])    She is sleeping better and feeling better on current dose of Remeron . She has resumed Zoloft but at the 25  mg dose due to recurrent nausea at higher doses       Encounter for preventive health examination    age appropriate education and counseling updated, referrals for preventative services and immunizations addressed, dietary and  smoking counseling addressed, most recent labs reviewed.  I have personally reviewed and have noted:  1) the patient's medical and social history 2) The pt's use of alcohol, tobacco, and illicit drugs 3) The patient's current medications and supplements 4) Functional ability including ADL's, fall risk, home safety risk, hearing and visual impairment 5) Diet and physical activities 6) Evidence for depression or mood disorder 7) The patient's height, weight, and BMI have been recorded in the chart  I have made referrals, and provided counseling and education based on review of the above      Anemia    Chronic ,  secondary to chronic  inflammatory states.  She [place] up to  date on colon CA screening  Lab Results  Component Value Date   [id number] 03/01/2019         Does use hearing aid   Hyperlipidemia - Primary   Relevant Medications   metolazone (ZAROXOLYN) 2.5 MG tablet   Other Relevant Orders   Lipid panel (Completed)   Leukopenia   Relevant Orders   CBC with Differential/Platelet (Completed)      I have discontinued Gaynelle R. Idris "Emma"'s mirtazapine. I am also having her maintain her cholecalciferol, Calcium Carbonate-Vitamin D, polyethylene glycol, tizanidine, traMADol, ondansetron, triamcinolone ointment, Probiotic Product (PROBIOTIC PO), nitroGLYCERIN, omeprazole, letrozole, CRANBERRY PO, sodium fluoride, aspirin, sertraline, furosemide, losartan, metoprolol succinate, levothyroxine, atorvastatin, and metolazone.  Meds ordered this encounter  Medications  . metolazone (ZAROXOLYN) 2.5 MG tablet    Sig: Take 1 tablet (2.5 mg total) by mouth daily. 30 minutes before furosemide dose    Dispense:  30 tablet    Refill:  1    Medications Discontinued During This Encounter  Medication Reason  . mirtazapine (REMERON) 7.5 MG tablet     Follow-up: No follow-ups on file.   [name], MD

## 2019-07-15 LAB — COMPREHENSIVE METABOLIC PANEL
AG Ratio: 1.6 (calc) (ref 1.0–2.5)
ALT: 7 U/L (ref 6–29)
AST: 19 U/L (ref 10–35)
Albumin: 3.8 g/dL (ref 3.6–5.1)
Alkaline phosphatase (APISO): 99 U/L (ref 37–153)
BUN: 17 mg/dL (ref 7–25)
CO2: 28 mmol/L (ref 20–32)
Calcium: 9.5 mg/dL (ref 8.6–10.4)
Chloride: 99 mmol/L (ref 98–110)
Creat: 0.73 mg/dL (ref 0.60–0.88)
Globulin: 2.4 g/dL (calc) (ref 1.9–3.7)
Glucose, Bld: 90 mg/dL (ref 65–99)
Potassium: 4.7 mmol/L (ref 3.5–5.3)
Sodium: 136 mmol/L (ref 135–146)
Total Bilirubin: 0.6 mg/dL (ref 0.2–1.2)
Total Protein: 6.2 g/dL (ref 6.1–8.1)

## 2019-07-15 LAB — CBC WITH DIFFERENTIAL/PLATELET
Absolute Monocytes: 347 cells/uL (ref 200–950)
Basophils Absolute: 59 cells/uL (ref 0–200)
Basophils Relative: 1.8 %
Eosinophils Absolute: 198 cells/uL (ref 15–500)
Eosinophils Relative: 6 %
HCT: 30.7 % — ABNORMAL LOW (ref 35.0–45.0)
Hemoglobin: 10.6 g/dL — ABNORMAL LOW (ref 11.7–15.5)
Lymphs Abs: 1036 cells/uL (ref 850–3900)
MCH: 33.1 pg — ABNORMAL HIGH (ref 27.0–33.0)
MCHC: 34.5 g/dL (ref 32.0–36.0)
MCV: 95.9 fL (ref 80.0–100.0)
MPV: 9.6 fL (ref 7.5–12.5)
Monocytes Relative: 10.5 %
Neutro Abs: 1660 cells/uL (ref 1500–7800)
Neutrophils Relative %: 50.3 %
Platelets: 138 10*3/uL — ABNORMAL LOW (ref 140–400)
RBC: 3.2 10*6/uL — ABNORMAL LOW (ref 3.80–5.10)
RDW: 13.8 % (ref 11.0–15.0)
Total Lymphocyte: 31.4 %
WBC: 3.3 10*3/uL — ABNORMAL LOW (ref 3.8–10.8)

## 2019-07-15 LAB — LIPID PANEL
Cholesterol: 97 mg/dL (ref <200)
HDL: 34 mg/dL — ABNORMAL LOW (ref >=50)
LDL Cholesterol (Calc): 47 mg/dL (calc)
Non-HDL Cholesterol (Calc): 63 mg/dL (calc) (ref <130)
Total CHOL/HDL Ratio: 2.9 (calc) (ref <5.0)
Triglycerides: 79 mg/dL (ref <150)

## 2019-07-15 LAB — TSH: TSH: 0.59 mIU/L (ref 0.40–4.50)

## 2019-07-16 NOTE — Assessment & Plan Note (Signed)
She is sleeping better and feeling better on current dose of Remeron . She has resumed Zoloft but at the 25  mg dose due to recurrent nausea at higher doses  

## 2019-07-16 NOTE — Assessment & Plan Note (Signed)
recurrent finding,  In the setting of drop in platelets..repeat level today is stable  Lab Results  Component Value Date   WBC 3.3 (L) 07/14/2019   HGB 10.6 (L) 07/14/2019   HCT 30.7 (L) 07/14/2019   MCV 95.9 07/14/2019   PLT 138 (L) 07/14/2019

## 2019-07-16 NOTE — Assessment & Plan Note (Signed)
Goal is TSH 10  No changes today Lab Results  Component Value Date   TSH 0.59 07/14/2019

## 2019-07-16 NOTE — Assessment & Plan Note (Signed)
Well controlled on current regimen. Renal function stable, no changes today. 

## 2019-07-16 NOTE — Assessment & Plan Note (Signed)
Chronic ,  secondary to chronic  inflammatory states.  She [place] up to date on colon CA screening  Lab Results  Component Value Date   [id number] 03/01/2019

## 2019-07-16 NOTE — Assessment & Plan Note (Signed)

## 2019-07-26 ENCOUNTER — Other Ambulatory Visit: Payer: Self-pay | Admitting: Internal Medicine

## 2019-07-26 MED ORDER — SERTRALINE HCL 25 MG PO TABS: 25.0000 mg | ORAL_TABLET | Freq: Every day | ORAL | 1 refills | Status: DC

## 2019-07-26 NOTE — Telephone Encounter (Signed)
Medication Refill - Medication:  sertraline (ZOLOFT) 50 MG tablet  Has the patient contacted their pharmacy? Yes advised to call office.   Preferred Pharmacy (with phone number or street name):  [id number] [place], [place] [phone] (Phone) [phone] (Fax)   Agent: Please be advised that RX refills may take up to 3 business days. We ask that you follow-up with your pharmacy.

## 2019-08-24 ENCOUNTER — Other Ambulatory Visit: Payer: Self-pay

## 2019-08-24 DIAGNOSIS — C50912 Malignant neoplasm of unspecified site of left female breast: Secondary | ICD-10-CM

## 2019-08-25 MED ORDER — LETROZOLE 2.5 MG PO TABS: 2.5000 mg | ORAL_TABLET | Freq: Every day | ORAL | 3 refills | Status: DC

## 2019-09-04 ENCOUNTER — Other Ambulatory Visit: Payer: Self-pay

## 2019-09-04 NOTE — Progress Notes (Signed)
[facility]  [address] [place], [place] [address] Phone: [phone]  Fax: [phone]   Telemedicine Office Visit:  09/06/2019  Referring physician: [name], MD  I connected with [name] on 09/06/19 at 12:06 PM by videoconferencing and verified that I was speaking with the correct person using 2 identifiers.  The patient was at home.  I discussed the limitations, risk, security and privacy concerns of performing an evaluation and management service by videoconferencing and the availability of in person appointments.  I also discussed with the patient that there may be a patient responsible charge related to this service.  The patient expressed understanding and agreed to proceed.   Chief Complaint: Kim Santiago is a 84 y.o. female with stage IIB left breast cancer who is seen for 6 month assessment.   HPI: The patient was last seen in the medical oncology clinic on 03/01/2019. At that time, she was having issues with fluid in her legs. She had ongoing lupus flare and was using topical ointments. She denied any breast concerns. Exam was stable.  CBC followed: 04/03/2019:  Hematocrit 31.8, hemoglobin 10.8, MCV 100.6, platelets 135,000, WBC 3100, ANC 1500. CA27.29 was 35.2. 05/31/2019:  Hematocrit 31.5, hemoglobin 10.5, MCV 99.7, platelets 120,000, WBC 3100, ANC 1500.   CA27.29 was 34.0. 07/14/2019:  Hematocrit 30.7, hemoglobin 10.6, MCV 95.9, platelets 138,000, WBC 3300, ANC 1660.    09/05/2019:  Hematocrit 29.4, hemoglobin 9.9, MCV 99.3, platelets 146,000, WBC 3200, ANC 1500.     CA27.29 was 29.1.  During the interim, she notes fluid in her legs. She wear compression socks and will take Lasix PRN.  She describes chronic bruising and bleeding under the skin that looks bad. She will scrape a doorknob and will bruise easily. She hasn't taken Plavix in the past 6 months. She takes 81 mg aspirin 3 days a week. She scrapped her left arm  on a doorknob and tore her skin. She was prescribed clindamycin.   Her daughter called [facility] and a home nurse came out and dressed her wound. Daughter states she was on antibiotics when she blood work drawn yesterday.  Her diet is normal.    Past Medical History:  Diagnosis Date  . Arthritis   . Breast cancer ([place]) 2017   left mastectomy done 11/2015  . Breast cancer in female [facility]) 11/18/2015   Left: 3.9 cm tumor, T2, 1/2 sentinel nodes positive for macro metastatic disease, N1, 3 negative nodes in the axillary tail, ER+,PR+, Her 2 neu, low Mammoprint score  . Cancer [facility])    thyroid takes levothyroxine  . Chronic kidney disease    UTI  . Genetic screening March 2017.   Mammoprint of left breast cancer: Low risk for recurrence.   Marland Kitchen History of heart attack 06/12/2014  . Hypertension   . hypothyroidism    secondary to thyroidectomy for thyroid ca  . Hypothyroidism   . Lupus ([facility])    subcutaneous  . Menopause 40s   natural, hot flashes and mood lability now gone, off prempro 7 months  . Myocardial infarction ([place]) 2013  . Osteoporosis    Osteopenia  . Rosacea   . Sjoegren syndrome   . Stress-induced cardiomyopathy September of 2013   EF 35%. Peak troponin was 1.8.    Past Surgical History:  Procedure Laterality Date  . BACK SURGERY    . BREAST BIOPSY Left 10/30/15   positive, done in Dr. Dwyane Luo office  .  CARDIAC CATHETERIZATION  05/2012   [facility]. No significant CAD. Ejection fraction of 35% due to stress-induced cardiomyopathy.  . CHOLECYSTECTOMY    . COLONOSCOPY    . DILATION AND CURETTAGE OF UTERUS    . KYPHOSIS SURGERY  Feb 2008   L1, Dr. Mauri Pole  . LUMBAR DISC SURGERY     L4-L5  . MASTECTOMY Left 11/18/2015   positive  . SENTINEL NODE BIOPSY Left 11/18/2015   Procedure: SENTINEL NODE BIOPSY;  Surgeon: Robert Bellow, MD;  Location: [facility] ORS;  Service: General;  Laterality: Left;  . SHOULDER ARTHROSCOPY  2004   Left, Dr. Jefm Bryant  . SIMPLE  MASTECTOMY WITH AXILLARY SENTINEL NODE BIOPSY Left 11/18/2015   Procedure: SIMPLE MASTECTOMY;  Surgeon: Robert Bellow, MD;  Location: [facility] ORS;  Service: General;  Laterality: Left;  . SPINE SURGERY     L4-5 diskectomy  . THYROIDECTOMY     Thyroid Cancer  . TONSILLECTOMY    . TUBAL LIGATION      Family History  Problem Relation Age of Onset  . COPD Father   . Cancer Father        esophageal  . Kidney disease Mother   . Heart disease Mother   . Kidney disease Sister   . Cancer Brother 98       colon cancer (both brothers)  . Heart attack Brother 85  . Heart disease Brother   . Breast cancer Neg Hx     Social History:  reports that she has never smoked. She has never used smokeless tobacco. She reports that she does not drink alcohol or use drugs. She is from [place], [place]. She was married for 12 years. Her husband died 63 months ago. She has an adopted daughter and 2 grandchildren.Her brother died of a MI following a cholecystectomy in 12/2015. The patient is living at her daughter's house since her CVA in 11/2018. The patient is alone today.  Participants in the patient's visit and their role in the encounter included the patient, Samul Dada, scribe, and [employer], CMA, today.  The intake visit was provided by Samul Dada, scribe, and Vito Berger, CMA.    Allergies:  Allergies  Allergen Reactions  . Azathioprine Nausea And Vomiting    Severe vomiting  . Hydroxychloroquine Hives and Nausea And Vomiting  . Mycophenolate Mofetil Nausea Only  . Amoxicillin Rash    Has patient had a PCN reaction causing immediate rash, facial/tongue/throat swelling, SOB or lightheadedness with hypotension: Unknown Has patient had a PCN reaction causing severe rash involving mucus membranes or skin necrosis: Unknown Has patient had a PCN reaction that required hospitalization: Unknown Has patient had a PCN reaction occurring within the last 10 years: Unknown If all of  the above answers are "NO", then may proceed with Cephalosporin use. Tolerates ceftriaxone and Ancef  . Codeine Nausea And Vomiting  . Naprosyn [Naproxen] Swelling  . Orudis [Ketoprofen] Hives  . Sulfa Antibiotics Rash  . Sulfathiazole Rash    Current Medications: Current Outpatient Medications  Medication Sig Dispense Refill  . aspirin EC 81 MG EC tablet Take 1 tablet (81 mg total) by mouth daily. 180 tablet 0  . atorvastatin (LIPITOR) 20 MG tablet TAKE 1 TABLET BY MOUTH DAILY AT 6PM 90 tablet 1  . Calcium Carbonate-Vitamin D (CALCIUM + D) 600-200 MG-UNIT TABS Take 2 tablets by mouth daily.     . cholecalciferol (VITAMIN D) 1000 UNITS tablet Take 2,000 Units by mouth daily.     Marland Kitchen  CRANBERRY PO Take 1 capsule by mouth 2 (two) times daily.    . furosemide (LASIX) 20 MG tablet 1 TABLET EVERY OTHER DAY AS NEEDED 45 tablet 2  . letrozole (FEMARA) 2.5 MG tablet Take 1 tablet (2.5 mg total) by mouth daily. 90 tablet 3  . levothyroxine (SYNTHROID) 75 MCG tablet TAKE 1 TABLET BY MOUTH (75MCG TOTAL) DAILY 90 tablet 2  . losartan (COZAAR) 25 MG tablet Take 1 tablet (25 mg total) by mouth daily. 90 tablet 1  . metolazone (ZAROXOLYN) 2.5 MG tablet Take 1 tablet (2.5 mg total) by mouth daily. 30 minutes before furosemide dose 30 tablet 1  . metoprolol succinate (TOPROL-XL) 25 MG 24 hr tablet Take 1 tablet by mouth every day 90 tablet 3  . nitroGLYCERIN (NITROSTAT) 0.4 MG SL tablet Place 1 tablet (0.4 mg total) under the tongue every 5 (five) minutes as needed for chest pain. Maximum 3 doses 50 tablet 3  . omeprazole (PRILOSEC) 20 MG capsule Take 1 capsule by mouth twice a day before a meal 180 capsule 0  . ondansetron (ZOFRAN ODT) 4 MG disintegrating tablet Take 1 tablet (4 mg total) by mouth every 8 (eight) hours as needed for nausea or vomiting. 20 tablet 0  . polyethylene glycol (MIRALAX / GLYCOLAX) packet Take 17 g by mouth daily.    . Probiotic Product (PROBIOTIC PO) Take 1 tablet by mouth daily.     . sertraline (ZOLOFT) 25 MG tablet Take 1 tablet (25 mg total) by mouth daily. 90 tablet 1  . sodium fluoride (DENTA 5000 PLUS) 1.1 % CREA dental cream Take 1 application by mouth daily.    . tizanidine (ZANAFLEX) 2 MG capsule Take 1 capsule (2 mg total) by mouth 3 (three) times daily. 90 capsule 0  . triamcinolone ointment (KENALOG) 0.1 % Apply 1 application topically daily.  0  . clindamycin (CLEOCIN) 300 MG capsule     . traMADol (ULTRAM) 50 MG tablet Take 1 tablet (50 mg total) by mouth every 8 (eight) hours as needed. 60 tablet 2   No current facility-administered medications for this visit.    Review of Systems  Constitutional: Positive for malaise/fatigue. Negative for chills, diaphoresis, fever and weight loss (up 6 lbs.).       Feeling "good".  HENT: Positive for hearing loss. Negative for congestion, ear discharge, ear pain, nosebleeds, sinus pain, sore throat and tinnitus.        Xerostomia due to Sjogren's.  Eyes: Negative for double vision, photophobia and pain.       Dry eyes secondary to Sjogren's.  Respiratory: Negative.  Negative for cough, hemoptysis, sputum production and shortness of breath.   Cardiovascular: Negative.  Negative for chest pain, palpitations, orthopnea, leg swelling and PND.  Gastrointestinal: Negative.  Negative for abdominal pain, blood in stool, constipation, diarrhea, heartburn, melena, nausea and vomiting.  Genitourinary: Negative.  Negative for dysuria, frequency, hematuria and urgency.  Musculoskeletal: Positive for joint pain. Negative for back pain, falls, myalgias and neck pain.  Skin: Positive for rash (cutaneous lupus "ugly", using ointments). Negative for itching.  Neurological: Negative for dizziness, tingling, tremors, sensory change, speech change, focal weakness, seizures, loss of consciousness, weakness and headaches.       Interval CVA.  No recent syncopal episodes. Still off balance.  Endo/Heme/Allergies: Bruises/bleeds easily  (scrapped doorknob and had open wound).       HYPOthyroidism on levothyroxine.  Psychiatric/Behavioral: Negative.  Negative for depression and memory loss. The patient is not nervous/anxious  and does not have insomnia.   All other systems reviewed and are negative.   Performance status (ECOG): 1 - Symptomatic but completely ambulatory  Vitals There were no vitals taken for this visit.   Physical Exam  Constitutional: She is oriented to person, place, and time.  HENT:  Short styled gray hair.  Eyes:  Glasses.  Pulmonary/Chest: Right breast exhibits tenderness (fibrocytic changes).  Lymphadenopathy:    She has no axillary adenopathy.       Right: No supraclavicular adenopathy present.       Left: No supraclavicular adenopathy present.  Neurological: She is alert and oriented to person, place, and time.  Skin:  Upper extremity bruising.  Psychiatric: She has a normal mood and affect. Her behavior is normal. Judgment and thought content normal.  Nursing note and vitals reviewed.   Appointment on 09/05/2019  Component Date Value Ref Range Status  . CA 27.29 09/05/2019 29.1  0.0 - 38.6 U/mL Final   Comment: (NOTE) Siemens Centaur Immunochemiluminometric Methodology [facility]) Values obtained with different assay methods or kits cannot be used interchangeably. Results cannot be interpreted as absolute evidence of the presence or absence of malignant disease. Performed At: [facility] [place], [place] [id number] Rush Farmer MD [id number]   . Sodium 09/05/2019 131* 135 - 145 mmol/L Final  . Potassium 09/05/2019 4.3  3.5 - 5.1 mmol/L Final  . Chloride 09/05/2019 97* 98 - 111 mmol/L Final  . CO2 09/05/2019 29  22 - 32 mmol/L Final  . Glucose, Bld 09/05/2019 103* 70 - 99 mg/dL Final  . BUN 09/05/2019 20  8 - 23 mg/dL Final  . Creatinine, Ser 09/05/2019 1.01* 0.44 - 1.00 mg/dL Final  . Calcium 09/05/2019 8.6* 8.9 - 10.3 mg/dL Final  . Total Protein  09/05/2019 6.4* 6.5 - 8.1 g/dL Final  . Albumin 09/05/2019 3.6  3.5 - 5.0 g/dL Final  . AST 09/05/2019 30  15 - 41 U/L Final  . ALT 09/05/2019 15  0 - 44 U/L Final  . Alkaline Phosphatase 09/05/2019 90  38 - 126 U/L Final  . Total Bilirubin 09/05/2019 0.9  0.3 - 1.2 mg/dL Final  . GFR calc non Af Amer 09/05/2019 52* >60 mL/min Final  . GFR calc Af Amer 09/05/2019 >60  >60 mL/min Final  . Anion gap 09/05/2019 5  5 - 15 Final   Performed at [facility], [address], [place], [place] [address]  . WBC 09/05/2019 3.2* 4.0 - 10.5 K/uL Final  . RBC 09/05/2019 2.96* 3.87 - 5.11 MIL/uL Final  . Hemoglobin 09/05/2019 9.9* 12.0 - 15.0 g/dL Final  . HCT 09/05/2019 29.4* 36.0 - 46.0 % Final  . MCV 09/05/2019 99.3  80.0 - 100.0 fL Final  . MCH 09/05/2019 33.4  26.0 - 34.0 pg Final  . MCHC 09/05/2019 33.7  30.0 - 36.0 g/dL Final  . RDW 09/05/2019 15.9* 11.5 - 15.5 % Final  . Platelets 09/05/2019 146* 150 - 400 K/uL Final   Comment: Immature Platelet Fraction may be clinically indicated, consider ordering this additional test [id number]   . nRBC 09/05/2019 0.0  0.0 - 0.2 % Final  . Neutrophils Relative % 09/05/2019 46  % Final  . Neutro Abs 09/05/2019 1.5* 1.7 - 7.7 K/uL Final  . Lymphocytes Relative 09/05/2019 38  % Final  . Lymphs Abs 09/05/2019 1.2  0.7 - 4.0 K/uL Final  . Monocytes Relative 09/05/2019 8  % Final  . Monocytes Absolute 09/05/2019 0.3  0.1 - 1.0 K/uL Final  . Eosinophils Relative 09/05/2019 6  % Final  . Eosinophils Absolute 09/05/2019 0.2  0.0 - 0.5 K/uL Final  . Basophils Relative 09/05/2019 2  % Final  . Basophils Absolute 09/05/2019 0.1  0.0 - 0.1 K/uL Final  . Immature Granulocytes 09/05/2019 0  % Final  . Abs Immature Granulocytes 09/05/2019 0.01  0.00 - 0.07 K/uL Final   Performed at [facility], [address] [place], [place] [address]    Assessment:  DEDRA MATSUO is a 84 y.o. female stage IIB left breast cancers/p mastectomy on  11/18/2015. Pathology revealed a 3.9 cm grade II invasive mammary carcinoma. There was ductal carcinoma in situ (DCIS). There was lymphvascular and dermal lymphatic invasion. Margins were negative. One of 2 sentinel lymph nodes were positive for macrometastasis. In addition, there were 3 benign axillary lymph nodes (total 5 lymph nodes). Tumor was estrogen receptor positive (100%), progesterone receptor positive (70%), and Her2/neu negative. Pathologic stage was T2N1a.   Bilateral mammogram and ultrasound on 11/11/2015 revealed a 3 cm left breast mass at the 6 o'clock position (1 cm from the nipple) and a 1.6 cm irregular hypoechoicleft breast mass at 1 o'clock position (5 cm from the nipple) with overlying skin thickening. Right mammogramon 11/18/2016 revealed no evidence of malignancy. Right mammogramon 11/23/2017 revealed no evidence of malignancy.  Right mammogram on 02/28/2019 revealed no evidence of malignancy.  MammaPrinton 12/18/2015 revealed a low risk of recurrence with a 5% risk of distant recurrence of 5 years and a 10% recurrence at 10 years.  She declined radiation. She began [facility] 01/09/2016. She has a little joint pain and a few hot flashes.  PET scanon 12/23/2016 revealed slightly enlarged right lower paratracheal lymph node and some clustered small AP window lymph nodes with metabolic activity very minimally above background in the mediastinum. There were no other findings of hypermetabolic lesions in the neck, chest, abdomen, or pelvis. Chest CTon 03/16/2017 revealed stable mild mediastinal lymphadenopathy. Largest node was 1.0 cm in the right paratracheal region. There was no new or progressive disease within thorax. Chest CTon 09/23/2017 revealed stable mild mediastinal adenopathy (1.1 cm precarinal and 0.9 cm pretracheal lymph node). There was no new or progressive disease. Chest CTon 09/26/2018 revealed a stable exam.There was no change in the  previous described mediastinal lymph nodes.There were stable tiny right lung nodules consistent with benign etiology  CA27.29 has been followed: 30.4 on 11/15/2015, 32.5 on 05/13/2016, 19.0 on 08/12/2016, 40.8 on 11/11/2016, 45.8 on 12/14/2016, 31.6 on 03/17/2017, 45.7 on 07/14/2017, 43.4 on 12/08/2017, 33.2 on 04/27/2018, 31.3 on 08/24/2018, 43.1 on 03/01/2019, 35.2 on 04/03/2019, 34.0 on 05/31/2019, and 29.1 on 09/05/2019.  Bone density on 12/05/2015 revealed osteopeniawith a T-score of -1.5 in the left femoral neck. Bone density on 12/07/2017 revealed osteopenia with a T-score of -1.6 in the right femoral neck. She is on calcium and vitamin D.  She has a history of a myocardial infarctionin 06/2012. Echocardiogram revealed an EF of 35%. Follow-up echo on 09/21/2015 revealed an EF of 55-60%. Patient is unaware of a history of heart failure. She has a history of thyroid carcinoma40 years ago treated with I-131.  She has a mild normocytic anemia. Diet is good. Colonoscopyon 07/25/2013 revealed 3 small polyps (33m proximal descending colon, 3 mm splenic flexure, 1 mm ascending colon). She denies any melena or hematochezia. TSH was normal on 12/26/2015 and 08/24/2018.Copper was normal on 08/24/2018.  She has cutaneous lupusand Sjogren's. She had  a reaction to Plaquenil. She completed an extended steroid taper. She is on methotrexate8 tablets every Wednesday (increased 3-4 weeks ago).  She was admitted to [facility] 11/07/2018 - 11/08/2018 with a CVA. She presented with left sided focal numbness and weakness. Head MRI and MRAwithout contrast revealed an acute versus subacute faint right almost nonhemorrhagic infarct. There was moderate to severe chronic small vessel ischemic changes. There was no large vesselflow-limiting stenosis.  Bilateralcarotid ultrasound revealed<50% stenosis in the right and left internal carotid arteries  Symptomatically, she feels "pretty  good".  She has fragile skin and recently tore the skin on her arm.  She is on antibiotics.  She denies any breast concerns.  Plan: 1.   Review labs from 09/05/2019. 2.   Stage IIB left breast cancer Clinically, she is doing well.   She denies any breast concerns.   Mammogram on 02/28/2019 revealed no evidence of malignancy. Continue Femara.  3.   Leukopenia- stable  [id number].  ANC 1500. Etiology possibly related to lupus. TSH and copper have been normal. Flow cytometry on 12/19/2018 revealed no abnormality.  She has had no interval infections. Continue neutropenic precuations. 4.   Anemia  Hematocrit 29.4.  Hemoglobin 9.9.  MCV 99.3. B12 was 904 and folate 14.5 on 03/01/2019. Ferritin was 141 with an iron saturation 19% and a TIBC 253 on 03/01/2019. Continue to monitor. 4.   Mediastinal adenopathy Chest CT on 09/26/2018 revealed stable mediastinal adenopathy.   Schedule chest CT on 09/28/2019. 5.   Osteopenia Patient on calcium and vitamin D.  Calcium 8.6 (corrected 8.94). Prolia on hold. 6.   Cutaneous lupus Patient with current mild lupus.              She is followed by [facility] dermatology. 7.   Mild renal insufficiency  Creatinine 1.01.  Baseline creatinine 0.54 - 0.78 in past year.  Encourage hydration.  Follow-up with Dr. Derrel Nip. 8.   Schedule mammogram on 02/29/2020. 9.   RTC in 1 month for MD assessment, labs (CBC, ferritin, PT, PTT), and review of chest CT.  I discussed the assessment and treatment plan with the patient.  The patient was provided an opportunity to ask questions and all were answered.  The patient agreed with the plan and demonstrated an understanding of the instructions.  The patient was advised to call back if the symptoms worsen or if the condition fails to improve as anticipated.  I provided 25 minutes (12:06 PM - 12:31 PM) of face-to-face time during this this encounter and > 50% was spent counseling as documented under my assessment and plan.     Lequita Asal, MD, PhD    09/06/2019, 12:31 PM  I, Samul Dada, am acting as a scribe for Lequita Asal, MD.  I, [place] Mike Gip, MD, have reviewed the above documentation for accuracy and completeness, and I agree with the above.

## 2019-09-05 ENCOUNTER — Encounter: Payer: Self-pay | Admitting: Hematology and Oncology

## 2019-09-05 ENCOUNTER — Inpatient Hospital Stay: Payer: PPO | Attending: Hematology and Oncology

## 2019-09-05 ENCOUNTER — Other Ambulatory Visit: Payer: Self-pay

## 2019-09-05 DIAGNOSIS — Z170 Estrogen receptor positive status [ER+]: Secondary | ICD-10-CM

## 2019-09-05 DIAGNOSIS — C50912 Malignant neoplasm of unspecified site of left female breast: Secondary | ICD-10-CM | POA: Diagnosis not present

## 2019-09-05 LAB — CBC WITH DIFFERENTIAL/PLATELET
Abs Immature Granulocytes: 0.01 10*3/uL (ref 0.00–0.07)
Basophils Absolute: 0.1 10*3/uL (ref 0.0–0.1)
Basophils Relative: 2 %
Eosinophils Absolute: 0.2 10*3/uL (ref 0.0–0.5)
Eosinophils Relative: 6 %
HCT: 29.4 % — ABNORMAL LOW (ref 36.0–46.0)
Hemoglobin: 9.9 g/dL — ABNORMAL LOW (ref 12.0–15.0)
Immature Granulocytes: 0 %
Lymphocytes Relative: 38 %
Lymphs Abs: 1.2 10*3/uL (ref 0.7–4.0)
MCH: 33.4 pg (ref 26.0–34.0)
MCHC: 33.7 g/dL (ref 30.0–36.0)
MCV: 99.3 fL (ref 80.0–100.0)
Monocytes Absolute: 0.3 10*3/uL (ref 0.1–1.0)
Monocytes Relative: 8 %
Neutro Abs: 1.5 10*3/uL — ABNORMAL LOW (ref 1.7–7.7)
Neutrophils Relative %: 46 %
Platelets: 146 10*3/uL — ABNORMAL LOW (ref 150–400)
RBC: 2.96 MIL/uL — ABNORMAL LOW (ref 3.87–5.11)
RDW: 15.9 % — ABNORMAL HIGH (ref 11.5–15.5)
WBC: 3.2 10*3/uL — ABNORMAL LOW (ref 4.0–10.5)
nRBC: 0 % (ref 0.0–0.2)

## 2019-09-05 LAB — COMPREHENSIVE METABOLIC PANEL
ALT: 15 U/L (ref 0–44)
AST: 30 U/L (ref 15–41)
Albumin: 3.6 g/dL (ref 3.5–5.0)
Alkaline Phosphatase: 90 U/L (ref 38–126)
Anion gap: 5 (ref 5–15)
BUN: 20 mg/dL (ref 8–23)
CO2: 29 mmol/L (ref 22–32)
Calcium: 8.6 mg/dL — ABNORMAL LOW (ref 8.9–10.3)
Chloride: 97 mmol/L — ABNORMAL LOW (ref 98–111)
Creatinine, Ser: 1.01 mg/dL — ABNORMAL HIGH (ref 0.44–1.00)
GFR calc Af Amer: >60 mL/min (ref >60)
GFR calc non Af Amer: 52 mL/min — ABNORMAL LOW (ref >60)
Glucose, Bld: 103 mg/dL — ABNORMAL HIGH (ref 70–99)
Potassium: 4.3 mmol/L (ref 3.5–5.1)
Sodium: 131 mmol/L — ABNORMAL LOW (ref 135–145)
Total Bilirubin: 0.9 mg/dL (ref 0.3–1.2)
Total Protein: 6.4 g/dL — ABNORMAL LOW (ref 6.5–8.1)

## 2019-09-05 NOTE — Progress Notes (Signed)
No new changes noted today. The patient Name and DOB has been verified in office today. 

## 2019-09-06 ENCOUNTER — Inpatient Hospital Stay (HOSPITAL_BASED_OUTPATIENT_CLINIC_OR_DEPARTMENT_OTHER): Payer: PPO | Admitting: Hematology and Oncology

## 2019-09-06 ENCOUNTER — Encounter: Payer: Self-pay | Admitting: Hematology and Oncology

## 2019-09-06 ENCOUNTER — Other Ambulatory Visit: Payer: PPO

## 2019-09-06 DIAGNOSIS — C50912 Malignant neoplasm of unspecified site of left female breast: Secondary | ICD-10-CM | POA: Diagnosis not present

## 2019-09-06 DIAGNOSIS — D649 Anemia, unspecified: Secondary | ICD-10-CM | POA: Diagnosis not present

## 2019-09-06 DIAGNOSIS — D72819 Decreased white blood cell count, unspecified: Secondary | ICD-10-CM

## 2019-09-06 DIAGNOSIS — R590 Localized enlarged lymph nodes: Secondary | ICD-10-CM

## 2019-09-06 DIAGNOSIS — M85851 Other specified disorders of bone density and structure, right thigh: Secondary | ICD-10-CM | POA: Diagnosis not present

## 2019-09-06 DIAGNOSIS — Z170 Estrogen receptor positive status [ER+]: Secondary | ICD-10-CM

## 2019-09-06 LAB — CANCER ANTIGEN 27.29: CA 27.29: 29.1 U/mL (ref 0.0–38.6)

## 2019-09-18 MED ORDER — ATORVASTATIN CALCIUM 20 MG PO TABS: ORAL_TABLET | ORAL | 1 refills | Status: DC

## 2019-09-20 ENCOUNTER — Other Ambulatory Visit: Payer: Self-pay

## 2019-09-28 ENCOUNTER — Ambulatory Visit
Admission: RE | Admit: 2019-09-28 | Discharge: 2019-09-28 | Disposition: A | Discharge status: Home / Self Care | Payer: PPO | Source: Ambulatory Visit | Attending: Hematology and Oncology | Admitting: Hematology and Oncology

## 2019-09-28 ENCOUNTER — Other Ambulatory Visit: Payer: Self-pay

## 2019-09-28 DIAGNOSIS — R590 Localized enlarged lymph nodes: Secondary | ICD-10-CM | POA: Diagnosis not present

## 2019-09-28 DIAGNOSIS — R918 Other nonspecific abnormal finding of lung field: Secondary | ICD-10-CM | POA: Diagnosis not present

## 2019-10-09 ENCOUNTER — Encounter: Payer: Self-pay | Admitting: Hematology and Oncology

## 2019-10-09 NOTE — Progress Notes (Signed)
[facility]  [address] [place], [place] [address] Phone: [phone]  Fax: [phone]   [place] Office Visit:  10/11/2019  Referring physician: [name], MD  I connected with Kim Santiago on 10/11/19 at 3:16 PM by videoconferencing and verified that I was speaking with the correct person using 2 identifiers.  The patient was at home.  I discussed the limitations, risk, security and privacy concerns of performing an evaluation and management service by videoconferencing and the availability of in person appointments.  I also discussed with the patient that there may be a patient responsible charge related to this service.  The patient expressed understanding and agreed to proceed.   Chief Complaint: Kim Santiago is a 85 y.o. female with stage IIB left breast cancer on letrozole who is seen for 2 month assessment and review of interval chest CT.  HPI: The patient was last seen in the medical oncology clinic on 09/06/2019. At that time,  she felt "pretty good".  She had fragile skin and recently tore the skin on her arm.  She was on antibiotics.  She denied any breast concerns.  CA27.29 was 29.1.  Chest CT wo contrast on 09/28/2019 revealed no acute cardiopulmonary disease.  There was subtle chronic stable bibasilar interstitial prominence.  There were a couple of tiny stable right lung nodules with the larger measuring 4 mm unchanged for 2 years and therefore considered benign.  There was a stable 1.1 cm precarinal lymph node likely reactive.  There was mild anterior wedging of 2 adjacent midthoracic vertebral bodies unchanged. Previous kyphoplasty in the region of the thoracolumbar junction was unchanged.  Labs on 10/10/2019 revealed a hematocrit 32.6, hemoglobin 10.8, MCV 100.6, platelets 122,000, WBC 3600, ANC 1600. Ferritin was 237,  PT 13.1 with an INR of 1.0. PTT was 29.  During the interim, she has been the same; she has been trying to  do some exercise. She notes having energy keeping up while exercising. Weigh is stable  Symptomatically, she feels the same.  Her fatigue is the most troubling issue. She does most of her errands but due to getting very tired, she has to be caution of what activities she does so she won't become so fatigued.  She notes that her purpura is very bad. If she has blood drawn bruising while show up around the site with some bleeding under the skin.   She is no longer taking baby aspirin. She voices no breast concerns.   Her Prolia is on hold. She is taking her calcium and vitamin D.   She is scheduled for 3D unilateral right breast mammogram on 02/29/2020.  She takes a cranberry capsule everyday and hasn't had UTI since being on it.    Past Medical History:  Diagnosis Date  . Arthritis   . Breast cancer ([place]) 2017   left mastectomy done 11/2015  . Breast cancer in female [facility]) 11/18/2015   Left: 3.9 cm tumor, T2, 1/2 sentinel nodes positive for macro metastatic disease, N1, 3 negative nodes in the axillary tail, ER+,PR+, Her 2 neu, low Mammoprint score  . Cancer [facility])    thyroid takes levothyroxine  . Chronic kidney disease    UTI  . Genetic screening March 2017.   Mammoprint of left breast cancer: Low risk for recurrence.   Marland Kitchen History of heart attack 06/12/2014  . Hypertension   . hypothyroidism    secondary to thyroidectomy for thyroid ca  . Hypothyroidism   .  Lupus ([facility])    subcutaneous  . Menopause 40s   natural, hot flashes and mood lability now gone, off prempro 7 months  . Myocardial infarction ([place]) 2013  . Osteoporosis    Osteopenia  . Rosacea   . Sjoegren syndrome   . Stress-induced cardiomyopathy September of 2013   EF 35%. Peak troponin was 1.8.    Past Surgical History:  Procedure Laterality Date  . BACK SURGERY    . BREAST BIOPSY Left 10/30/15   positive, done in Dr. Dwyane Luo office  . CARDIAC CATHETERIZATION  05/2012   [facility]. No significant CAD. Ejection fraction of  35% due to stress-induced cardiomyopathy.  . CHOLECYSTECTOMY    . COLONOSCOPY    . DILATION AND CURETTAGE OF UTERUS    . KYPHOSIS SURGERY  Feb 2008   L1, Dr. Mauri Pole  . LUMBAR DISC SURGERY     L4-L5  . MASTECTOMY Left 11/18/2015   positive  . SENTINEL NODE BIOPSY Left 11/18/2015   Procedure: SENTINEL NODE BIOPSY;  Surgeon: Robert Bellow, MD;  Location: [facility] ORS;  Service: General;  Laterality: Left;  . SHOULDER ARTHROSCOPY  2004   Left, Dr. Jefm Bryant  . SIMPLE MASTECTOMY WITH AXILLARY SENTINEL NODE BIOPSY Left 11/18/2015   Procedure: SIMPLE MASTECTOMY;  Surgeon: Robert Bellow, MD;  Location: [facility] ORS;  Service: General;  Laterality: Left;  . SPINE SURGERY     L4-5 diskectomy  . THYROIDECTOMY     Thyroid Cancer  . TONSILLECTOMY    . TUBAL LIGATION      Family History  Problem Relation Age of Onset  . COPD Father   . Cancer Father        esophageal  . Kidney disease Mother   . Heart disease Mother   . Kidney disease Sister   . Cancer Brother 109       colon cancer (both brothers)  . Heart attack Brother 50  . Heart disease Brother   . Breast cancer Neg Hx     Social History:  reports that she has never smoked. She has never used smokeless tobacco. She reports that she does not drink alcohol or use drugs. She is from [place], [place]. She was married for 81 years. She has an adopted daughter and 2 grandchildren.Her brother died of a MI following a cholecystectomy in 12/2015. The patient is living at her daughter's house since her CVA in 11/2018.  The patient is alone today.  Participants in the patient's visit and their role in the encounter included the patient, Samul Dada, scribe, and [employer], CMA, today.  The intake visit was provided by Samul Dada, scribe and Vito Berger, [place]in[place].   Allergies:  Allergies  Allergen Reactions  . Azathioprine Nausea And Vomiting    Severe vomiting  . Hydroxychloroquine Hives and Nausea And Vomiting  . Mycophenolate  Mofetil Nausea Only  . Amoxicillin Rash    Has patient had a PCN reaction causing immediate rash, facial/tongue/throat swelling, SOB or lightheadedness with hypotension: Unknown Has patient had a PCN reaction causing severe rash involving mucus membranes or skin necrosis: Unknown Has patient had a PCN reaction that required hospitalization: Unknown Has patient had a PCN reaction occurring within the last 10 years: Unknown If all of the above answers are "NO", then may proceed with Cephalosporin use. Tolerates ceftriaxone and Ancef  . Codeine Nausea And Vomiting  . Naprosyn [Naproxen] Swelling  . Orudis [Ketoprofen] Hives  . Sulfa Antibiotics Rash  . Sulfathiazole Rash  Current Medications: Current Outpatient Medications  Medication Sig Dispense Refill  . aspirin EC 81 MG EC tablet Take 1 tablet (81 mg total) by mouth daily. 180 tablet 0  . atorvastatin (LIPITOR) 20 MG tablet TAKE 1 TABLET BY MOUTH DAILY AT 6PM 90 tablet 1  . Calcium Carbonate-Vitamin D (CALCIUM + D) 600-200 MG-UNIT TABS Take 2 tablets by mouth daily.     . cholecalciferol (VITAMIN D) 1000 UNITS tablet Take 2,000 Units by mouth daily.     . clindamycin (CLEOCIN) 300 MG capsule     . CRANBERRY PO Take 1 capsule by mouth 2 (two) times daily.    . furosemide (LASIX) 20 MG tablet 1 TABLET EVERY OTHER DAY AS NEEDED 45 tablet 2  . letrozole (FEMARA) 2.5 MG tablet Take 1 tablet (2.5 mg total) by mouth daily. 90 tablet 3  . levothyroxine (SYNTHROID) 75 MCG tablet TAKE 1 TABLET BY MOUTH (75MCG TOTAL) DAILY 90 tablet 2  . losartan (COZAAR) 25 MG tablet Take 1 tablet (25 mg total) by mouth daily. 90 tablet 1  . metolazone (ZAROXOLYN) 2.5 MG tablet Take 1 tablet (2.5 mg total) by mouth daily. 30 minutes before furosemide dose 30 tablet 1  . metoprolol succinate (TOPROL-XL) 25 MG 24 hr tablet Take 1 tablet by mouth every day 90 tablet 3  . omeprazole (PRILOSEC) 20 MG capsule Take 1 capsule by mouth twice a day before a meal 180  capsule 0  . polyethylene glycol (MIRALAX / GLYCOLAX) packet Take 17 g by mouth daily.    . Probiotic Product (PROBIOTIC PO) Take 1 tablet by mouth daily.    . sertraline (ZOLOFT) 25 MG tablet Take 1 tablet (25 mg total) by mouth daily. 90 tablet 1  . sodium fluoride (DENTA 5000 PLUS) 1.1 % CREA dental cream Take 1 application by mouth daily.    . tizanidine (ZANAFLEX) 2 MG capsule Take 1 capsule (2 mg total) by mouth 3 (three) times daily. 90 capsule 0  . traMADol (ULTRAM) 50 MG tablet Take 1 tablet (50 mg total) by mouth every 8 (eight) hours as needed. 60 tablet 2  . triamcinolone ointment (KENALOG) 0.1 % Apply 1 application topically daily.  0  . nitroGLYCERIN (NITROSTAT) 0.4 MG SL tablet Place 1 tablet (0.4 mg total) under the tongue every 5 (five) minutes as needed for chest pain. Maximum 3 doses (Patient not taking: Reported on 10/09/2019) 50 tablet 3  . ondansetron (ZOFRAN ODT) 4 MG disintegrating tablet Take 1 tablet (4 mg total) by mouth every 8 (eight) hours as needed for nausea or vomiting. (Patient not taking: Reported on 10/09/2019) 20 tablet 0   No current facility-administered medications for this visit.    Review of Systems  Constitutional: Positive for malaise/fatigue. Negative for chills, diaphoresis, fever and weight loss (up 6 lbs).       Feeling "the same".  HENT: Positive for hearing loss. Negative for congestion, ear discharge, ear pain, nosebleeds, sinus pain, sore throat and tinnitus.        Xerostomia due to Sjogren's.  Eyes: Negative for double vision, photophobia and pain.       Dry eyes secondary to Sjogren's.  Respiratory: Negative.  Negative for cough, hemoptysis, sputum production and shortness of breath.   Cardiovascular: Negative.  Negative for chest pain, palpitations, orthopnea, leg swelling and PND.  Gastrointestinal: Negative.  Negative for abdominal pain, blood in stool, constipation, diarrhea, heartburn, melena, nausea and vomiting.  Genitourinary:  Negative.  Negative for dysuria, frequency, hematuria  and urgency.       No interval UTIs.  Musculoskeletal: Positive for joint pain. Negative for back pain, falls, myalgias and neck pain.  Skin: Positive for rash (cutaneous lupus "ugly", using ointments). Negative for itching.  Neurological: Negative for dizziness, tingling, tremors, sensory change, speech change, focal weakness, seizures, loss of consciousness, weakness and headaches.       Interval CVA.  No recent syncopal episodes. Still off balance.  Endo/Heme/Allergies: Bruises/bleeds easily (purpura).       HYPOthyroidism on levothyroxine.  Psychiatric/Behavioral: Negative.  Negative for depression and memory loss. The patient is not nervous/anxious and does not have insomnia.   All other systems reviewed and are negative.  Performance status (ECOG):  1  Vitals There were no vitals taken for this visit.   Physical Exam  Constitutional: She is oriented to person, place, and time. She appears well-developed and well-nourished.  HENT:  Head: Normocephalic and atraumatic.  Short gray hair.  Eyes: Conjunctivae and EOM are normal. No scleral icterus.  Brown eyes.  Neurological: She is alert and oriented to person, place, and time.  Psychiatric: She has a normal mood and affect. Her behavior is normal. Judgment and thought content normal.  Nursing note reviewed.   Appointment on 10/10/2019  Component Date Value Ref Range Status  . Prothrombin Time 10/10/2019 13.1  11.4 - 15.2 seconds Final  . INR 10/10/2019 1.0  0.8 - 1.2 Final   Comment: (NOTE) INR goal varies based on device and disease states. Performed at [facility], [address], [place], [place] [address]   . aPTT 10/10/2019 29  24 - 36 seconds Final   Performed at [facility], [address] [place], [place] [address]  . Ferritin 10/10/2019 237  11 - 307 ng/mL Final   Performed at [facility], [address] [place], [place]  [address]  . WBC 10/10/2019 3.6* 4.0 - 10.5 K/uL Final  . RBC 10/10/2019 3.24* 3.87 - 5.11 MIL/uL Final  . Hemoglobin 10/10/2019 10.8* 12.0 - 15.0 g/dL Final  . HCT 10/10/2019 32.6* 36.0 - 46.0 % Final  . MCV 10/10/2019 100.6* 80.0 - 100.0 fL Final  . MCH 10/10/2019 33.3  26.0 - 34.0 pg Final  . MCHC 10/10/2019 33.1  30.0 - 36.0 g/dL Final  . RDW 10/10/2019 15.7* 11.5 - 15.5 % Final  . Platelets 10/10/2019 122* 150 - 400 K/uL Final  . nRBC 10/10/2019 0.0  0.0 - 0.2 % Final  . Neutrophils Relative % 10/10/2019 44  % Final  . Neutro Abs 10/10/2019 1.6* 1.7 - 7.7 K/uL Final  . Lymphocytes Relative 10/10/2019 37  % Final  . Lymphs Abs 10/10/2019 1.3  0.7 - 4.0 K/uL Final  . Monocytes Relative 10/10/2019 11  % Final  . Monocytes Absolute 10/10/2019 0.4  0.1 - 1.0 K/uL Final  . Eosinophils Relative 10/10/2019 5  % Final  . Eosinophils Absolute 10/10/2019 0.2  0.0 - 0.5 K/uL Final  . Basophils Relative 10/10/2019 2  % Final  . Basophils Absolute 10/10/2019 0.1  0.0 - 0.1 K/uL Final  . Immature Granulocytes 10/10/2019 1  % Final  . Abs Immature Granulocytes 10/10/2019 0.04  0.00 - 0.07 K/uL Final   Performed at [facility], [address] [place], [place] [address]    Assessment:  Kim Santiago is a 85 y.o. female  stage IIB left breast cancers/p mastectomy on 11/18/2015. Pathology revealed a 3.9 cm grade II invasive mammary carcinoma. There  was ductal carcinoma in situ (DCIS). There was lymphvascular and dermal lymphatic invasion. Margins were negative. One of 2 sentinel lymph nodes were positive for macrometastasis. In addition, there were 3 benign axillary lymph nodes (total 5 lymph nodes). Tumor was estrogen receptor positive (100%), progesterone receptor positive (70%), and Her2/neu negative. Pathologic stage was T2N1a.   Bilateral mammogram and ultrasound on 11/11/2015 revealed a 3 cm left breast mass at the 6 o'clock position (1 cm from the nipple) and a 1.6 cm  irregular hypoechoicleft breast mass at 1 o'clock position (5 cm from the nipple) with overlying skin thickening. Right mammogramon 11/18/2016 revealed no evidence of malignancy. Right mammogramon 11/23/2017 revealed no evidence of malignancy.  Right mammogram on 02/28/2019 revealed no evidence of malignancy.  Mamma[place] 12/18/2015 revealed a low risk of recurrence with a 5% risk of distant recurrence of 5 years and a 10% recurrence at 10 years.  She declined radiation. She began [facility] 01/09/2016. She has a little joint pain and a few hot flashes.  PET scanon 12/23/2016 revealed slightly enlarged right lower paratracheal lymph node and some clustered small AP window lymph nodes with metabolic activity very minimally above background in the mediastinum. There were no other findings of hypermetabolic lesions in the neck, chest, abdomen, or pelvis. Chest CTon 03/16/2017 revealed stable mild mediastinal lymphadenopathy. Largest node was 1.0 cm in the right paratracheal region. There was no new or progressive disease within thorax. Chest CTon 09/23/2017 revealed stable mild mediastinal adenopathy (1.1 cm precarinal and 0.9 cm pretracheal lymph node). There was no new or progressive disease. Chest CTon 09/26/2018 revealed a stable exam.There was no change in the previous described mediastinal lymph nodes.There were stable tiny right lung nodules consistent with benign etiology.  Chest CT on 09/28/2019 revealed no acute cardiopulmonary disease.  There was subtle chronic stable bibasilar interstitial prominence.  There were a couple of tiny stable right lung nodules with the larger measuring 4 mm unchanged for 2 years and therefore considered benign.  There was a stable 1.1 cm precarinal lymph node likely reactive.  There was mild anterior wedging of 2 adjacent midthoracic vertebral bodies unchanged and previous kyphoplasty in the region of the thoracolumbar junction, unchanged.   CA27.29 has been followed: 30.4 on 11/15/2015, 32.5 on 05/13/2016, 19.0 on 08/12/2016, 40.8 on 11/11/2016, 45.8 on 12/14/2016, 31.6 on 03/17/2017, 45.7 on 07/14/2017, 43.4 on 12/08/2017, 33.2 on 04/27/2018, 31.3 on 08/24/2018, 43.1 on 03/01/2019, 35.2 on 04/03/2019, 34.0 on 05/31/2019, and 29.1 on 09/05/2019.  Bone density on 12/05/2015 revealed osteopeniawith a T-score of -1.5 in the left femoral neck. Bone density on 12/07/2017 revealed osteopenia with a T-score of -1.6 in the right femoral neck. She is on calcium and vitamin D.  She has a history of a myocardial infarctionin 06/2012. Echocardiogram revealed an EF of 35%. Follow-up echo on 09/21/2015 revealed an EF of 55-60%. Patient is unaware of a history of heart failure. She has a history of thyroid carcinoma40 years ago treated with I-131.  She has a mild normocytic anemia. Diet is good. Colonoscopyon 07/25/2013 revealed 3 small polyps (56m proximal descending colon, 3 mm splenic flexure, 1 mm ascending colon). She denies any melena or hematochezia. TSH was normal on 12/26/2015 and 08/24/2018.Copper was normal on 08/24/2018.  She has cutaneous lupusand Sjogren's. She had a reaction to Plaquenil. She completed an extended steroid taper. She is on methotrexate8 tablets every Wednesday (increased 3-4 weeks ago).  [facility] with a CVA. She presented with  left sided focal numbness and weakness. Head MRI and MRAwithout contrast revealed an acute versus subacute faint right almost nonhemorrhagic infarct. There was moderate to severe chronic small vessel ischemic changes. There was no large vesselflow-limiting stenosis. Bilateralcarotid ultrasound revealed<50% stenosis in the right and left internal carotid arteries  Symptomatically, she denies any breast concerns.  She has bruising.  She is not on aspirin.  Plan: 1.   Review labs from 10/10/2019. 2.Stage IIB left  breast cancer Clinically,  she continues to do well. She denies any breast concerns.  Breast exam on 03/01/2019 revealed no evidence of recurrent disease. Mammogram on 02/28/2019 revealed no evidence of malignancy. Continue Femara (began 01/09/2016).  Mammogram scheduled for 02/29/2020. 3.Leukopenia- improved  [id number]. ANC 1600. Etiology possibly related to lupus. TSH and copper are normal. Flow cytometry on04/13/2020 revealed noabnormality.  She has had no infections. Patient to contact office if any fever, chills or not feeling well. 4.Anemia  Hematocrit 32.6.  Hemoglobin 10.8.  MCV 100.6. B12 was 904 and folate 14.5 on 03/01/2019. Ferritin 237 today. Continue to monitor. 5.Mediastinal adenopathy Chest CT on 09/26/2018 revealed stable mediastinal adenopathy. Review chest CT on 09/28/2019.  Images personally reviewed.  Agree with radiology interpretation.  There was subtle chronic stable bibasilar interstitial prominence.    There were a couple of tiny stable right lung nodules with the largest 4 mm unchanged for 2 years (benign).    There was a stable 1.1 cm precarinal lymph node, likely reactive.    There was mild anterior wedging of 2 adjacent midthoracic vertebral bodies unchanged. 5.Osteopenia Patient  remains on calcium and vitamin D.. Prolia on hold. 6.   Ecchymosis  Patient off aspirin.  Check platelet function assay. 7.   RTC in 6 months for MD assessment, labs (CBC with diff, CMP, CA 27.29, ferritin, platelet function assay) and review of mammogram.   I discussed the assessment and treatment plan with the patient.  The patient was provided an opportunity to ask questions and all were answered.  The patient agreed with the plan and demonstrated an understanding of the instructions.  The patient was advised to call back if the symptoms worsen or if the condition fails to improve as anticipated.   Lequita Asal, MD, PhD    10/11/2019, 3:30 PM   I, Samul Dada, am acting as a scribe for Lequita Asal, MD.  I, [place] Mike Gip, MD, have reviewed the above documentation for accuracy and completeness, and I agree with the above.

## 2019-10-09 NOTE — Progress Notes (Signed)
No new changes noted today. The patient name and DOB has been verified by phone today. 

## 2019-10-10 ENCOUNTER — Other Ambulatory Visit: Payer: Self-pay

## 2019-10-10 ENCOUNTER — Inpatient Hospital Stay: Payer: PPO | Attending: Hematology and Oncology

## 2019-10-10 DIAGNOSIS — C50912 Malignant neoplasm of unspecified site of left female breast: Secondary | ICD-10-CM

## 2019-10-10 LAB — CBC WITH DIFFERENTIAL/PLATELET
Abs Immature Granulocytes: 0.04 10*3/uL (ref 0.00–0.07)
Basophils Absolute: 0.1 10*3/uL (ref 0.0–0.1)
Basophils Relative: 2 %
Eosinophils Absolute: 0.2 10*3/uL (ref 0.0–0.5)
Eosinophils Relative: 5 %
HCT: 32.6 % — ABNORMAL LOW (ref 36.0–46.0)
Hemoglobin: 10.8 g/dL — ABNORMAL LOW (ref 12.0–15.0)
Immature Granulocytes: 1 %
Lymphocytes Relative: 37 %
Lymphs Abs: 1.3 10*3/uL (ref 0.7–4.0)
MCH: 33.3 pg (ref 26.0–34.0)
MCHC: 33.1 g/dL (ref 30.0–36.0)
MCV: 100.6 fL — ABNORMAL HIGH (ref 80.0–100.0)
Monocytes Absolute: 0.4 10*3/uL (ref 0.1–1.0)
Monocytes Relative: 11 %
Neutro Abs: 1.6 10*3/uL — ABNORMAL LOW (ref 1.7–7.7)
Neutrophils Relative %: 44 %
Platelets: 122 10*3/uL — ABNORMAL LOW (ref 150–400)
RBC: 3.24 MIL/uL — ABNORMAL LOW (ref 3.87–5.11)
RDW: 15.7 % — ABNORMAL HIGH (ref 11.5–15.5)
WBC: 3.6 10*3/uL — ABNORMAL LOW (ref 4.0–10.5)
nRBC: 0 % (ref 0.0–0.2)

## 2019-10-10 LAB — FERRITIN: Ferritin: 237 ng/mL (ref 11–307)

## 2019-10-10 LAB — PROTIME-INR
INR: 1 (ref 0.8–1.2)
Prothrombin Time: 13.1 seconds (ref 11.4–15.2)

## 2019-10-10 LAB — APTT: aPTT: 29 seconds (ref 24–36)

## 2019-10-11 ENCOUNTER — Other Ambulatory Visit: Payer: PPO

## 2019-10-11 ENCOUNTER — Inpatient Hospital Stay (HOSPITAL_BASED_OUTPATIENT_CLINIC_OR_DEPARTMENT_OTHER): Payer: PPO | Admitting: Hematology and Oncology

## 2019-10-11 ENCOUNTER — Encounter: Payer: Self-pay | Admitting: Hematology and Oncology

## 2019-10-11 DIAGNOSIS — R590 Localized enlarged lymph nodes: Secondary | ICD-10-CM | POA: Diagnosis not present

## 2019-10-11 DIAGNOSIS — Z170 Estrogen receptor positive status [ER+]: Secondary | ICD-10-CM | POA: Diagnosis not present

## 2019-10-11 DIAGNOSIS — D649 Anemia, unspecified: Secondary | ICD-10-CM

## 2019-10-11 DIAGNOSIS — D72819 Decreased white blood cell count, unspecified: Secondary | ICD-10-CM | POA: Diagnosis not present

## 2019-10-11 DIAGNOSIS — M85851 Other specified disorders of bone density and structure, right thigh: Secondary | ICD-10-CM

## 2019-10-11 DIAGNOSIS — C50912 Malignant neoplasm of unspecified site of left female breast: Secondary | ICD-10-CM | POA: Diagnosis not present

## 2019-11-20 ENCOUNTER — Other Ambulatory Visit: Payer: Self-pay | Admitting: Internal Medicine

## 2019-12-06 DIAGNOSIS — H6122 Impacted cerumen, left ear: Secondary | ICD-10-CM | POA: Diagnosis not present

## 2019-12-06 DIAGNOSIS — H903 Sensorineural hearing loss, bilateral: Secondary | ICD-10-CM | POA: Diagnosis not present

## 2020-01-05 DIAGNOSIS — L931 Subacute cutaneous lupus erythematosus: Secondary | ICD-10-CM | POA: Diagnosis not present

## 2020-01-05 DIAGNOSIS — L710 Perioral dermatitis: Secondary | ICD-10-CM | POA: Diagnosis not present

## 2020-01-05 DIAGNOSIS — D692 Other nonthrombocytopenic purpura: Secondary | ICD-10-CM | POA: Diagnosis not present

## 2020-01-05 DIAGNOSIS — T691XXD Chilblains, subsequent encounter: Secondary | ICD-10-CM | POA: Diagnosis not present

## 2020-01-15 ENCOUNTER — Ambulatory Visit: Payer: PPO | Admitting: Internal Medicine

## 2020-01-16 ENCOUNTER — Other Ambulatory Visit: Payer: Self-pay

## 2020-01-16 ENCOUNTER — Encounter: Payer: Self-pay | Admitting: Internal Medicine

## 2020-01-16 ENCOUNTER — Ambulatory Visit (INDEPENDENT_AMBULATORY_CARE_PROVIDER_SITE_OTHER): Payer: PPO | Admitting: Internal Medicine

## 2020-01-16 VITALS — BP 160/84 | HR 83 | Temp 97.1°F | Resp 16 | Ht 63.75 in | Wt 151.4 lb

## 2020-01-16 DIAGNOSIS — I69359 Hemiplegia and hemiparesis following cerebral infarction affecting unspecified side: Secondary | ICD-10-CM | POA: Insufficient documentation

## 2020-01-16 DIAGNOSIS — C773 Secondary and unspecified malignant neoplasm of axilla and upper limb lymph nodes: Secondary | ICD-10-CM

## 2020-01-16 DIAGNOSIS — E032 Hypothyroidism due to medicaments and other exogenous substances: Secondary | ICD-10-CM

## 2020-01-16 DIAGNOSIS — E8809 Other disorders of plasma-protein metabolism, not elsewhere classified: Secondary | ICD-10-CM

## 2020-01-16 DIAGNOSIS — D702 Other drug-induced agranulocytosis: Secondary | ICD-10-CM | POA: Insufficient documentation

## 2020-01-16 DIAGNOSIS — F331 Major depressive disorder, recurrent, moderate: Secondary | ICD-10-CM | POA: Diagnosis not present

## 2020-01-16 DIAGNOSIS — D692 Other nonthrombocytopenic purpura: Secondary | ICD-10-CM

## 2020-01-16 DIAGNOSIS — I10 Essential (primary) hypertension: Secondary | ICD-10-CM

## 2020-01-16 DIAGNOSIS — M3500 Sicca syndrome, unspecified: Secondary | ICD-10-CM

## 2020-01-16 DIAGNOSIS — R609 Edema, unspecified: Secondary | ICD-10-CM

## 2020-01-16 NOTE — Assessment & Plan Note (Signed)
recurrent finding,  In the setting of drop in platelets..repeat level today is stable  Lab Results  Component Value Date   WBC 3.6 (L) 10/10/2019   HGB 10.8 (L) 10/10/2019   HCT 32.6 (L) 10/10/2019   MCV 100.6 (H) 10/10/2019   PLT 122 (L) 10/10/2019

## 2020-01-16 NOTE — Assessment & Plan Note (Signed)
Well controlled on current regimen. Renal function stable, no changes today.her deficits have nearly resolved

## 2020-01-16 NOTE — Assessment & Plan Note (Signed)
Thyroid function has been  WNL on current dose.  No current changes needed.   Lab Results  Component Value Date   TSH 0.59 07/14/2019

## 2020-01-16 NOTE — Assessment & Plan Note (Signed)
Well controlled on current regimen. Renal function stable, no changes today.  Lab Results  Component Value Date   CREATININE 1.01 (H) 09/05/2019   Lab Results  Component Value Date   NA 131 (L) 09/05/2019   K 4.3 09/05/2019   CL 97 (L) 09/05/2019   CO2 29 09/05/2019

## 2020-01-16 NOTE — Assessment & Plan Note (Signed)
Recommend using compression knee socks to manage edema.

## 2020-01-16 NOTE — Assessment & Plan Note (Signed)
Improved, with weight gain attriuted to increased appetite and edema.  Stopping mirtazapine

## 2020-01-16 NOTE — Patient Instructions (Addendum)
Your weight loss appears to be some from fluid and some from your health APPETITE!   1)  monitor your weight daily and confirm whether using  EVERY OTHER DAY USE OF The fluid pills effects a weight loss overnight   2) Continue to wear compression stockings from early morning until bedtime   3) I RECOMMEND GOING TO [place]  For your compression stockings     Send me blood pressure readings in one week.  Check it  once daily   If all readings are  over 130/80,  I will add daily hctz to the losartan in a combination pill  To take in the morning and  Switch the metoprolol to nighttime    Let's stop the appetite stimulating medication (MIrtazipine):  If your are taking 15 mg mirtazapine,  Take 1/2 tablet daily   For one  week , then stop  If already on 7.5 mg mirtazipine you can jsut stop it,

## 2020-01-16 NOTE — Progress Notes (Signed)
Subjective:  Patient ID: [name], female    DOB: March 11, 1935  Age: 85 y.o. MRN: [id number]  CC: The primary encounter diagnosis was Edema, unspecified type. Diagnoses of Secondary and unspecified malignant neoplasm of axilla and upper limb lymph nodes ([place]), Other drug-induced neutropenia ([place]), Hemiplegia affecting dominant side, post-stroke ([place]), Sicca syndrome, unspecified ([place]), Major depressive disorder, recurrent episode, moderate ([place]), Purpura ([place]), Essential hypertension, Edema due to hypoalbuminemia, and Iatrogenic hypothyroidism were also pertinent to this visit.  HPI [name] presents for follow up on multiple issues.   Patient has received both doses of the available COVID 19 vaccine without complications.  Patient continues to mask when outside of the home except when walking in yard or at safe distances from others .  Patient denies any change in mood or development of unhealthy behaviors resuting from the pandemic's restriction of activities and socialization.    This visit occurred during the SARS-CoV-2 public health emergency.  Safety protocols were in place, including screening questions prior to the visit, additional usage of staff PPE, and extensive cleaning of exam room while observing appropriate contact time as indicated for disinfecting solutions.   Seeing Baxter Kail for cutaneous lupus   Oncology scheduling her mammogram   Edema lower extremity, using metolazone. Lasix every other for the last 2 months  Prescribed metronidazole cream for perioral dermatitis $80 small bottle   BP :  Home readings on e every 2 weeks normal.     Outpatient Medications Prior to Visit  Medication Sig Dispense Refill  . atorvastatin (LIPITOR) 20 MG tablet TAKE 1 TABLET BY MOUTH DAILY AT 6PM 90 tablet 1  . Calcium Carbonate-Vitamin D (CALCIUM + D) 600-200 MG-UNIT TABS Take 2 tablets by mouth daily.     . cholecalciferol (VITAMIN D) 1000 UNITS tablet Take 2,000 Units  by mouth daily.     Marland Kitchen CRANBERRY PO Take 1 capsule by mouth 2 (two) times daily.    . furosemide (LASIX) 20 MG tablet 1 TABLET EVERY OTHER DAY AS NEEDED 45 tablet 2  . letrozole (FEMARA) 2.5 MG tablet Take 1 tablet (2.5 mg total) by mouth daily. 90 tablet 3  . levothyroxine (SYNTHROID) 75 MCG tablet TAKE 1 TABLET BY MOUTH (75MCG TOTAL) DAILY 90 tablet 2  . losartan (COZAAR) 25 MG tablet TAKE 1 TABLET BY MOUTH EVERY DAY 90 tablet 1  . metolazone (ZAROXOLYN) 2.5 MG tablet Take 1 tablet (2.5 mg total) by mouth daily. 30 minutes before furosemide dose 30 tablet 1  . metoprolol succinate (TOPROL-XL) 25 MG 24 hr tablet Take 1 tablet by mouth every day 90 tablet 3  . metroNIDAZOLE (METROCREAM) 0.75 % cream Apply 1 application topically 2 (two) times daily.    . nitroGLYCERIN (NITROSTAT) 0.4 MG SL tablet Place 1 tablet (0.4 mg total) under the tongue every 5 (five) minutes as needed for chest pain. Maximum 3 doses 50 tablet 3  . omeprazole (PRILOSEC) 20 MG capsule Take 1 capsule by mouth twice a day before a meal 180 capsule 0  . polyethylene glycol (MIRALAX / GLYCOLAX) packet Take 17 g by mouth daily.    . Probiotic Product (PROBIOTIC PO) Take 1 tablet by mouth daily.    . sertraline (ZOLOFT) 25 MG tablet TAKE 1 TABLET BY MOUTH EVERY DAY 90 tablet 1  . sodium fluoride (DENTA 5000 PLUS) 1.1 % CREA dental cream Take 1 application by mouth daily.    . tizanidine (ZANAFLEX) 2 MG capsule Take 1 capsule (2 mg  total) by mouth 3 (three) times daily. 90 capsule 0  . traMADol (ULTRAM) 50 MG tablet Take 1 tablet (50 mg total) by mouth every 8 (eight) hours as needed. 60 tablet 2  . triamcinolone ointment (KENALOG) 0.1 % Apply 1 application topically daily.  0  . ondansetron (ZOFRAN ODT) 4 MG disintegrating tablet Take 1 tablet (4 mg total) by mouth every 8 (eight) hours as needed for nausea or vomiting. (Patient not taking: Reported on 10/09/2019) 20 tablet 0  . aspirin EC 81 MG EC tablet Take 1 tablet (81 mg total)  by mouth daily. (Patient not taking: Reported on 01/16/2020) 180 tablet 0  . clindamycin (CLEOCIN) 300 MG capsule      No facility-administered medications prior to visit.    Review of Systems;  Patient denies headache, fevers, malaise, unintentional weight loss, skin rash, eye pain, sinus congestion and sinus pain, sore throat, dysphagia,  hemoptysis , cough, dyspnea, wheezing, chest pain, palpitations, orthopnea, edema, abdominal pain, nausea, melena, diarrhea, constipation, flank pain, dysuria, hematuria, urinary  Frequency, nocturia, numbness, tingling, seizures,  Focal weakness, Loss of consciousness,  Tremor, insomnia, depression, anxiety, and suicidal ideation.      Objective:  BP (!) 160/84 (BP Location: Right Arm, Patient Position: Sitting, Cuff Size: Normal)   Pulse 83   Temp (!) 97.1 F (36.2 C) (Temporal)   Resp 16   Ht 5' 3.75" (1.619 m)   Wt 151 lb 6.4 oz (68.7 kg)   SpO2 99%   BMI 26.19 kg/m   BP Readings from Last 3 Encounters:  01/16/20 (!) 160/84  07/14/19 140/78  04/06/19 (!) 139/57    Wt Readings from Last 3 Encounters:  01/16/20 151 lb 6.4 oz (68.7 kg)  07/14/19 146 lb (66.2 kg)  03/02/19 141 lb 3.2 oz (64 kg)    General appearance: alert, cooperative and appears stated age Ears: normal TM's and external ear canals both ears Throat: lips, mucosa, and tongue normal; teeth and gums normal Neck: no adenopathy, no carotid bruit, supple, symmetrical, trachea midline and thyroid not enlarged, symmetric, no tenderness/mass/nodules Back: symmetric, no curvature. ROM normal. No CVA tenderness. Lungs: clear to auscultation bilaterally Heart: regular rate and rhythm, S1, S2 normal, no murmur, click, rub or gallop Abdomen: soft, non-tender; bowel sounds normal; no masses,  no organomegaly Pulses: 2+ and symmetric Skin: Skin examination was notable for the following: -Left eye and left nares with small 1 mm pinpoint erythematous papules in the left periocular and  left perinasal distribution -Chest and bilateral upper arms with purpuric macules, irregular in shape -Bilateral toes and plantar feet with purpuric macules and patches All areas not commented on are within normal limits or unremarkable.  Lymph nodes: Cervical, supraclavicular, and axillary nodes normal.  Lab Results  Component Value Date   HGBA1C 4.9 11/08/2018    Lab Results  Component Value Date   CREATININE 1.01 (H) 09/05/2019   CREATININE 0.73 07/14/2019   CREATININE 0.69 05/31/2019    Lab Results  Component Value Date   WBC 3.6 (L) 10/10/2019   HGB 10.8 (L) 10/10/2019   HCT 32.6 (L) 10/10/2019   PLT 122 (L) 10/10/2019   GLUCOSE 103 (H) 09/05/2019   CHOL 97 07/14/2019   TRIG 79 07/14/2019   HDL 34 (L) 07/14/2019   LDLDIRECT 117.6 12/27/2012   LDLCALC 47 07/14/2019   ALT 15 09/05/2019   AST 30 09/05/2019   NA 131 (L) 09/05/2019   K 4.3 09/05/2019   CL 97 (L) 09/05/2019  CREATININE 1.01 (H) 09/05/2019   BUN 20 09/05/2019   CO2 29 09/05/2019   TSH 0.59 07/14/2019   INR 1.0 10/10/2019   HGBA1C 4.9 11/08/2018    CT CHEST WO CONTRAST  Result Date: 09/28/2019 CLINICAL DATA:  Follow-up mediastinal adenopathy. History of breast cancer 2017 post mastectomy. EXAM: CT CHEST WITHOUT CONTRAST TECHNIQUE: Multidetector CT imaging of the chest was performed following the standard protocol without IV contrast. COMPARISON:  09/26/2018 and 09/23/2017 FINDINGS: Cardiovascular: Heart is normal size. Calcified plaque is present over the left anterior descending and right coronary arteries. Thoracic aorta is normal caliber with mild calcified plaque present. Remaining vascular structures are unremarkable. Mediastinum/Nodes: Stable 9 mm and 1.1 cm precarinal lymph nodes. No hilar adenopathy. Remaining mediastinal structures are unremarkable. Lungs/Pleura: Lungs are adequately inflated. Linear scarring over the anteromedial right middle lobe. Stable 4 mm nodular density over the anterior  right upper lobe with smaller adjacent peripheral nodule unchanged. No new right lung nodules. No left lung nodules identified. Subtle peripheral increased interstitial markings over the lung bases right worse than left unchanged. Possible tiny amount of aspirate material over the dependent portion of the origin of the left mainstem bronchus. Airways are otherwise unremarkable Upper Abdomen: Previous cholecystectomy no acute findings. Calcified plaque is present over the abdominal aorta. Musculoskeletal: Degenerative changes of the spine. Previous kyphoplasty in the region of the thoracolumbar junction unchanged. Mild loss of anterior vertebral body height of 2 adjacent midthoracic vertebral bodies unchanged. IMPRESSION: 1. No acute cardiopulmonary disease. Subtle chronic stable bibasilar interstitial prominence. 2. Couple tiny stable right lung nodules with the larger measuring 4 mm unchanged for 2 years and therefore considered benign. Stable 1.1 cm precarinal lymph node likely reactive. 3. Aortic Atherosclerosis (ICD10-I70.0). Atherosclerotic coronary artery disease. 4. Mild anterior wedging of 2 adjacent midthoracic vertebral bodies unchanged. Previous kyphoplasty in the region of the thoracolumbar junction unchanged. Electronically Signed   By: Marin Olp M.D.   On: 09/28/2019 11:58    Assessment & Plan:   Problem List Items Addressed This Visit      Unprioritized   Edema due to hypoalbuminemia    Recommend using compression knee socks to manage edema.      Essential hypertension    Well controlled on current regimen. Renal function stable, no changes today.  Lab Results  Component Value Date   CREATININE 1.01 (H) 09/05/2019   Lab Results  Component Value Date   NA 131 (L) 09/05/2019   K 4.3 09/05/2019   CL 97 (L) 09/05/2019   CO2 29 09/05/2019         Hemiplegia affecting dominant side, post-stroke (HCC)    Well controlled on current regimen. Renal function stable, no changes  today.her deficits have nearly resolved       Iatrogenic hypothyroidism    Thyroid function has been  WNL on current dose.  No current changes needed.   Lab Results  Component Value Date   TSH 0.59 07/14/2019         Major depressive disorder, recurrent episode, moderate ([facility])    Improved, with weight gain attriuted to increased appetite and edema.  Stopping mirtazapine       Other drug-induced neutropenia ([facility])   Purpura ([facility])    recurrent finding,  In the setting of drop in platelets..repeat level today is stable  Lab Results  Component Value Date   WBC 3.6 (L) 10/10/2019   HGB 10.8 (L) 10/10/2019   HCT 32.6 (L) 10/10/2019   MCV 100.6 (  H) 10/10/2019   PLT 122 (L) 10/10/2019         Secondary and unspecified malignant neoplasm of axilla and upper limb lymph nodes ([place])    Other Visit Diagnoses    Edema, unspecified type    -  Primary   Relevant Orders   For home use only DME Other see comment   Sicca syndrome, unspecified ([place])   (Chronic)        I have discontinued Euphemia R. Sconyers "Emma"'s aspirin and clindamycin. I am also having her maintain her cholecalciferol, Calcium Carbonate-Vitamin D, polyethylene glycol, tizanidine, traMADol, ondansetron, triamcinolone ointment, Probiotic Product (PROBIOTIC PO), nitroGLYCERIN, omeprazole, CRANBERRY PO, sodium fluoride, metoprolol succinate, levothyroxine, metolazone, furosemide, letrozole, atorvastatin, sertraline, losartan, and metroNIDAZOLE.  No orders of the defined types were placed in this encounter.   Medications Discontinued During This Encounter  Medication Reason  . clindamycin (CLEOCIN) 300 MG capsule Completed Course  . aspirin EC 81 MG EC tablet Discontinued by provider    Follow-up: Return in about 6 months (around 07/18/2020).   [name], MD

## 2020-01-24 ENCOUNTER — Telehealth: Payer: Self-pay | Admitting: Internal Medicine

## 2020-01-24 NOTE — Telephone Encounter (Signed)
Patient called to give BP reading and weight for the last week   5/12- 147/67  W-147 5/13- 141/61  W-146.6 5/14- 137/58  W-145.4 5/15- 123/71  W-146.4 5/16- 159/74  W-146.6 5/17- 130/64  W-146  5/18- 120/60  W-146    Pt also needs refills on furosemide (LASIX) 20 MG tablet and omeprazole (PRILOSEC) 20 MG capsule Sent to [facility] in [place]

## 2020-01-25 MED ORDER — OMEPRAZOLE 20 MG PO CPDR: DELAYED_RELEASE_CAPSULE | ORAL | 0 refills | Status: DC

## 2020-01-25 MED ORDER — FUROSEMIDE 20 MG PO TABS: ORAL_TABLET | ORAL | 2 refills | Status: DC

## 2020-01-25 NOTE — Addendum Note (Signed)
Addended by: [name] on: 01/25/2020 01:24 PM   Modules accepted: Orders

## 2020-01-25 NOTE — Telephone Encounter (Signed)
Spoke with pt and informed her that her bp and weight readings are good and that Dr. Derrel Nip has refilled her medication.

## 2020-01-25 NOTE — Telephone Encounter (Signed)
Her BPs are fine  Weight is stable.  meds refilled

## 2020-02-09 DIAGNOSIS — H52223 Regular astigmatism, bilateral: Secondary | ICD-10-CM | POA: Diagnosis not present

## 2020-02-09 DIAGNOSIS — H5213 Myopia, bilateral: Secondary | ICD-10-CM | POA: Diagnosis not present

## 2020-02-09 DIAGNOSIS — H2513 Age-related nuclear cataract, bilateral: Secondary | ICD-10-CM | POA: Diagnosis not present

## 2020-02-09 DIAGNOSIS — H524 Presbyopia: Secondary | ICD-10-CM | POA: Diagnosis not present

## 2020-02-15 DIAGNOSIS — I639 Cerebral infarction, unspecified: Secondary | ICD-10-CM | POA: Diagnosis not present

## 2020-02-29 ENCOUNTER — Ambulatory Visit
Admission: RE | Admit: 2020-02-29 | Discharge: 2020-02-29 | Disposition: A | Discharge status: Home / Self Care | Payer: PPO | Source: Ambulatory Visit | Attending: Hematology and Oncology | Admitting: Hematology and Oncology

## 2020-02-29 ENCOUNTER — Other Ambulatory Visit: Payer: Self-pay

## 2020-02-29 DIAGNOSIS — C50912 Malignant neoplasm of unspecified site of left female breast: Secondary | ICD-10-CM | POA: Diagnosis not present

## 2020-02-29 DIAGNOSIS — Z170 Estrogen receptor positive status [ER+]: Secondary | ICD-10-CM | POA: Diagnosis not present

## 2020-02-29 DIAGNOSIS — Z1231 Encounter for screening mammogram for malignant neoplasm of breast: Secondary | ICD-10-CM | POA: Insufficient documentation

## 2020-03-06 ENCOUNTER — Ambulatory Visit: Payer: PPO | Admitting: Surgery

## 2020-03-08 DIAGNOSIS — H2512 Age-related nuclear cataract, left eye: Secondary | ICD-10-CM | POA: Diagnosis not present

## 2020-03-08 DIAGNOSIS — I10 Essential (primary) hypertension: Secondary | ICD-10-CM | POA: Diagnosis not present

## 2020-03-08 DIAGNOSIS — H2513 Age-related nuclear cataract, bilateral: Secondary | ICD-10-CM | POA: Diagnosis not present

## 2020-03-21 DIAGNOSIS — H25012 Cortical age-related cataract, left eye: Secondary | ICD-10-CM | POA: Diagnosis not present

## 2020-03-21 DIAGNOSIS — H2512 Age-related nuclear cataract, left eye: Secondary | ICD-10-CM | POA: Diagnosis not present

## 2020-03-22 DIAGNOSIS — H2511 Age-related nuclear cataract, right eye: Secondary | ICD-10-CM | POA: Diagnosis not present

## 2020-04-03 ENCOUNTER — Ambulatory Visit (INDEPENDENT_AMBULATORY_CARE_PROVIDER_SITE_OTHER): Payer: PPO

## 2020-04-03 VITALS — Ht 63.75 in | Wt 151.0 lb

## 2020-04-03 DIAGNOSIS — Z0000 Encounter for general adult medical examination without abnormal findings: Secondary | ICD-10-CM | POA: Diagnosis not present

## 2020-04-03 NOTE — Progress Notes (Addendum)
Subjective:   Kim Santiago is a 85 y.o. female who presents for Medicare Annual (Subsequent) preventive examination.  Review of Systems    No ROS.  Medicare Wellness Virtual Visit.    Cardiac Risk Factors include: advanced age (>44men, >63 women);hypertension     Objective:    Today's Vitals   04/03/20 0934  Weight: 151 lb (68.5 kg)  Height: 5' 3.75" (1.619 m)   Body mass index is 26.12 kg/m.  Advanced Directives 04/03/2020 10/09/2019 09/05/2019 04/03/2019 03/01/2019 12/16/2018 11/07/2018  Does Patient Have a Medical Advance Directive? Yes Yes No Yes Yes Yes No;Yes  Type of Paramedic of [place];Living will;Out of facility DNR (pink MOST or yellow form) Living will;Healthcare Power of Kim Santiago;Living will [place];Living will [place];Living will;Out of facility DNR (pink MOST or yellow form) [place];Living will;Out of facility DNR (pink MOST or yellow form) [place];Living will  Does patient want to make changes to medical advance directive? No - Patient declined No - Patient declined No - Patient declined No - Patient declined - - No - Patient declined  Copy of [place] in Chart? Yes - validated most recent copy scanned in chart (See row information) Yes - validated most recent copy scanned in chart (See row information) No - copy requested Yes - validated most recent copy scanned in chart (See row information) Yes - validated most recent copy scanned in chart (See row information) Yes - validated most recent copy scanned in chart (See row information) No - copy requested  Would patient like information on creating a medical advance directive? - No - Patient declined No - Patient declined - - - No - Patient declined    Current Medications (verified) Outpatient Encounter Medications as of 04/03/2020  Medication Sig   atorvastatin  (LIPITOR) 20 MG tablet TAKE 1 TABLET BY MOUTH DAILY AT 6PM   Calcium Carbonate-Vitamin D (CALCIUM + D) 600-200 MG-UNIT TABS Take 2 tablets by mouth daily.    cholecalciferol (VITAMIN D) 1000 UNITS tablet Take 2,000 Units by mouth daily.    CRANBERRY PO Take 1 capsule by mouth 2 (two) times daily.   furosemide (LASIX) 20 MG tablet 1 TABLET EVERY OTHER DAY AS NEEDED   letrozole (FEMARA) 2.5 MG tablet Take 1 tablet (2.5 mg total) by mouth daily.   levothyroxine (SYNTHROID) 75 MCG tablet TAKE 1 TABLET BY MOUTH (75MCG TOTAL) DAILY   losartan (COZAAR) 25 MG tablet TAKE 1 TABLET BY MOUTH EVERY DAY   metolazone (ZAROXOLYN) 2.5 MG tablet Take 1 tablet (2.5 mg total) by mouth daily. 30 minutes before furosemide dose   metoprolol succinate (TOPROL-XL) 25 MG 24 hr tablet Take 1 tablet by mouth every day   metroNIDAZOLE (METROCREAM) 0.75 % cream Apply 1 application topically 2 (two) times daily.   nitroGLYCERIN (NITROSTAT) 0.4 MG SL tablet Place 1 tablet (0.4 mg total) under the tongue every 5 (five) minutes as needed for chest pain. Maximum 3 doses   omeprazole (PRILOSEC) 20 MG capsule Take 1 capsule by mouth twice a day before a meal   ondansetron (ZOFRAN ODT) 4 MG disintegrating tablet Take 1 tablet (4 mg total) by mouth every 8 (eight) hours as needed for nausea or vomiting. (Patient not taking: Reported on 10/09/2019)   polyethylene glycol (MIRALAX / GLYCOLAX) packet Take 17 g by mouth daily.   Probiotic Product (PROBIOTIC PO) Take 1 tablet by mouth  daily.   sertraline (ZOLOFT) 25 MG tablet TAKE 1 TABLET BY MOUTH EVERY DAY   sodium fluoride (DENTA 5000 PLUS) 1.1 % CREA dental cream Take 1 application by mouth daily.   tizanidine (ZANAFLEX) 2 MG capsule Take 1 capsule (2 mg total) by mouth 3 (three) times daily.   traMADol (ULTRAM) 50 MG tablet Take 1 tablet (50 mg total) by mouth every 8 (eight) hours as needed.   triamcinolone ointment (KENALOG) 0.1 % Apply 1 application topically daily.   No  facility-administered encounter medications on file as of 04/03/2020.    Allergies (verified) Azathioprine, Hydroxychloroquine, Mycophenolate mofetil, Amoxicillin, Codeine, Naprosyn [naproxen], Orudis [ketoprofen], Sulfa antibiotics, and Sulfathiazole   History: Past Medical History:  Diagnosis Date   Arthritis    Breast cancer ([place]) 2017   left mastectomy done 11/2015   Breast cancer in female [facility]) 11/18/2015   Left: 3.9 cm tumor, T2, 1/2 sentinel nodes positive for macro metastatic disease, N1, 3 negative nodes in the axillary tail, ER+,PR+, Her 2 neu, low Mammoprint score   Cancer (H[facility])    thyroid takes levothyroxine   Chronic kidney disease    UTI   Genetic screening March 2017.   Mammoprint of left breast cancer: Low risk for recurrence.    History of heart attack 06/12/2014   Hypertension    hypothyroidism    secondary to thyroidectomy for thyroid ca   Hypothyroidism    Lupus ([place])    subcutaneous   Menopause 40s   natural, hot flashes and mood lability now gone, off prempro 7 months   Myocardial infarction [facility]) 2013   Osteoporosis    Osteopenia   Rosacea    Sjoegren syndrome    Stress-induced cardiomyopathy September of 2013   EF 35%. Peak troponin was 1.8.   Past Surgical History:  Procedure Laterality Date   BACK SURGERY     BREAST BIOPSY Left 10/30/15   positive, done in Dr. Dwyane Luo office   CARDIAC CATHETERIZATION  05/2012   [place]. No significant CAD. Ejection fraction of 35% due to stress-induced cardiomyopathy.   CHOLECYSTECTOMY     COLONOSCOPY     DILATION AND CURETTAGE OF UTERUS     KYPHOSIS SURGERY  Feb 2008   L1, Dr. Mauri Pole   LUMBAR DISC SURGERY     L4-L5   MASTECTOMY Left 11/18/2015   positive   SENTINEL NODE BIOPSY Left 11/18/2015   Procedure: SENTINEL NODE BIOPSY;  Surgeon: Robert Bellow, MD;  Location: [facility] ORS;  Service: General;  Laterality: Left;   SHOULDER ARTHROSCOPY  2004   Left, Dr. Jefm Bryant   SIMPLE MASTECTOMY WITH AXILLARY  SENTINEL NODE BIOPSY Left 11/18/2015   Procedure: SIMPLE MASTECTOMY;  Surgeon: Robert Bellow, MD;  Location: [facility] ORS;  Service: General;  Laterality: Left;   SPINE SURGERY     L4-5 diskectomy   THYROIDECTOMY     Thyroid Cancer   TONSILLECTOMY     TUBAL LIGATION     Family History  Problem Relation Age of Onset   COPD Father    Cancer Father        esophageal   Kidney disease Mother    Heart disease Mother    Kidney disease Sister    Cancer Brother 46       colon cancer (both brothers)   Heart attack Brother 71   Heart disease Brother    Breast cancer Neg Hx    Social History   Socioeconomic History   Marital status: Widowed  Spouse name: Not on file   Number of children: 1   Years of education: college   Highest education level: Not on file  Occupational History   Not on file  Tobacco Use   Smoking status: Never Smoker   Smokeless tobacco: Never Used  Vaping Use   Vaping Use: Never used  Substance and Sexual Activity   Alcohol use: No   Drug use: No   Sexual activity: Never  Other Topics Concern   Not on file  Social History Narrative   Has 1 adopted daughter.   Social Determinants of Health   Financial Resource Strain: Low Risk    Difficulty of Paying Living Expenses: Not hard at all  Food Insecurity: No Food Insecurity   Worried About Charity fundraiser in the Last Year: Never true   [place] in the Last Year: Never true  Transportation Needs: No Transportation Needs   Lack of Transportation (Medical): No   Lack of Transportation (Non-Medical): No  Physical Activity:    Days of Exercise per Week:    Minutes of Exercise per Session:   Stress: No Stress Concern Present   Feeling of Stress : Not at all  Social Connections:    Frequency of Communication with Friends and Family:    Frequency of Social Gatherings with Friends and Family:    Attends Religious Services:    Active Member of Clubs or Organizations:    Attends Programme researcher, broadcasting/film/video:    Marital Status:     Tobacco Counseling Counseling given: Not Answered   Clinical Intake:  Pre-visit preparation completed: Yes        Diabetes: No  How often do you need to have someone help you when you read instructions, pamphlets, or other written materials from your doctor or pharmacy?: 1 - Never  Interpreter Needed?: No      Activities of Daily Living In your present state of health, do you have any difficulty performing the following activities: 04/03/2020  Hearing? Y  Comment Hearing aids  Vision? N  Difficulty concentrating or making decisions? N  Walking or climbing stairs? Y  Dressing or bathing? N  Doing errands, shopping? Y  Comment Limited to local town  Conservation officer, nature and eating ? Y  Comment Daughter assist with meal prep. Self feeds.  Using the Toilet? N  In the past six months, have you accidently leaked urine? N  Do you have problems with loss of bowel control? N  Managing your Medications? Y  Managing your Finances? N  Housekeeping or managing your Housekeeping? N  Some recent data might be hidden    Patient Care Team: [name], MD as PCP - General (Internal Medicine) [name], MD (Internal Medicine) Bary Castilla Forest Gleason, MD (General Surgery) Oneta Rack, MD (Dermatology)  Indicate any recent Medical Services you may have received from other than [facility] providers in the past year (date may be approximate).     Assessment:   This is a routine wellness examination for Kim Santiago.  I connected with Joelene today by telephone and verified that I am speaking with the correct person using two identifiers. Location patient: home Location provider: work Persons participating in the virtual visit: patient, Marine scientist.    I discussed the limitations, risks, security and privacy concerns of performing an evaluation and management service by telephone and the availability of in person appointments. I also discussed  with the patient that there may be a patient  responsible charge related to this service. The patient expressed understanding and verbally consented to this telephonic visit.    Interactive audio and video telecommunications were attempted between this provider and patient, however failed, due to patient having technical difficulties OR patient did not have access to video capability.  We continued and completed visit with audio only.  Some vital signs may be absent or patient reported.   Hearing/Vision screen  Hearing Screening   125Hz  250Hz  500Hz  1000Hz  2000Hz  3000Hz  4000Hz  6000Hz  8000Hz   Right ear:           Left ear:           Comments: Hearing aid, bilateral   Vision Screening Comments: Followed by Dr. Matilde Sprang Wears corrective lenses Cataract extraction, bilateral Visual acuity not assessed, virtual visit.  They have seen their ophthalmologist in the last 12 months.     Dietary issues and exercise activities discussed:  Healthy diet Good water intake  Goals       Patient Stated     Increase physical activity (pt-stated)      Plans to start physical therapy for balance       Depression Screen PHQ 2/9 Scores 04/03/2020 07/14/2019 04/03/2019 03/31/2018 05/11/2017 03/30/2017 01/08/2017  PHQ - 2 Score 0 0 0 0 0 0 2  PHQ- 9 Score - - - - - - 5    Fall Risk Fall Risk  04/03/2020 01/16/2020 07/14/2019 04/03/2019 03/02/2019  Falls in the past year? 0 0 0 1 0  Number falls in past yr: 0 - - 0 -  Comment - - - No falls in the last 3-4 months. She sought medical care. -  Injury with Fall? - - - - -  [place] for fall due to : - - - - -  Follow up Falls evaluation completed Falls evaluation completed Falls evaluation completed - -   Handrails in use when climbing stairs? Yes  Home free of loose throw rugs in walkways, pet beds, electrical cords, etc? Yes  Adequate lighting in your home to reduce risk of falls? Yes   ASSISTIVE DEVICES UTILIZED TO PREVENT FALLS: Life alert?  No  Use of a cane, walker or w/c? Yes , walker/cane as needed Grab bars in the bathroom? Yes  Shower chair or bench in shower? Yes  Elevated toilet seat or a handicapped toilet? Yes   TIMED UP AND GO:  Was the test performed? No . Virtual visit.    Cognitive Function: MMSE - Mini Mental State Exam 03/31/2018 03/30/2017 08/12/2015  Orientation to time 5 5 5   Orientation to Place 5 5 5   Registration 3 3 3   Attention/ Calculation 5 5 5   Recall 3 3 3   Language- name 2 objects 2 2 2   Language- repeat 1 1 1   Language- follow 3 step command 3 3 3   Language- read & follow direction 1 1 1   Write a sentence 1 1 1   Copy design 1 1 1   Total score 30 30 30      6CIT Screen 04/03/2020 04/03/2019  What Year? 0 points 0 points  What month? 0 points 0 points  What time? 0 points 0 points  Count back from 20 0 points 0 points  Months in reverse 0 points 0 points  Repeat phrase 0 points 0 points  Total Score 0 0    Immunizations Immunization History  Administered Date(s) Administered   Influenza Split 07/08/2012   Influenza, High Dose  Seasonal PF 06/08/2016, 06/11/2017, 06/06/2019   Influenza,inj,Quad PF,6+ Mos 06/08/2013, 05/04/2014, 05/06/2015   Influenza-Unspecified 05/04/2018   PFIZER SARS-COV-2 Vaccination 09/27/2019, 10/21/2019   Pneumococcal Conjugate-13 03/06/2014   Pneumococcal Polysaccharide-23 10/04/2018   Pneumococcal-Unspecified 10/11/2007   Tdap 01/05/2013   Zoster 01/12/2012    Health Maintenance There are no preventive care reminders to display for this patient. Health Maintenance  Topic Date Due   INFLUENZA VACCINE  04/07/2020   TETANUS/TDAP  01/06/2023   DEXA SCAN  Completed   COVID-19 Vaccine  Completed   PNA vac Low Risk Adult  Completed   Dental Screening: Recommended annual dental exams for proper oral hygiene  Community Resource Referral / Chronic Care Management: CRR required this visit?  No   CCM required this visit?  No      Plan:   Keep all  routine maintenance appointments.   Follow up 07/19/20 @ 3:00   I have personally reviewed and noted the following in the patient's chart:   Medical and social history Use of alcohol, tobacco or illicit drugs  Current medications and supplements Functional ability and status Nutritional status Physical activity Advanced directives List of other physicians Hospitalizations, surgeries, and ER visits in previous 12 months Vitals Screenings to include cognitive, depression, and falls Referrals and appointments  In addition, I have reviewed and discussed with patient certain preventive protocols, quality metrics, and best practice recommendations. A written personalized care plan for preventive services as well as general preventive health recommendations were provided to patient via mychart.     OBrien-Blaney, Teejay Meader L, LPN   11/19/9456      I have reviewed the above information and agree with above.   Deborra Medina, MD

## 2020-04-03 NOTE — Patient Instructions (Addendum)
Kim Santiago , Thank you for taking time to come for your Medicare Wellness Visit. I appreciate your ongoing commitment to your health goals. Please review the following plan we discussed and let me know if I can assist you in the future.   These are the goals we discussed: Goals      Patient Stated   .  Increase physical activity (pt-stated)      Plans to start physical therapy for balance       This is a list of the screening recommended for you and due dates:  Health Maintenance  Topic Date Due  . Flu Shot  04/07/2020  . Tetanus Vaccine  01/06/2023  . DEXA scan (bone density measurement)  Completed  . COVID-19 Vaccine  Completed  . Pneumonia vaccines  Completed    Immunizations Immunization History  Administered Date(s) Administered  . Influenza Split 07/08/2012  . Influenza, High Dose Seasonal PF 06/08/2016, 06/11/2017, 06/06/2019  . Influenza,inj,Quad PF,6+ Mos 06/08/2013, 05/04/2014, 05/06/2015  . Influenza-Unspecified 05/04/2018  . PFIZER SARS-COV-2 Vaccination 09/27/2019, 10/21/2019  . Pneumococcal Conjugate-13 03/06/2014  . Pneumococcal Polysaccharide-23 10/04/2018  . Pneumococcal-Unspecified 10/11/2007  . Tdap 01/05/2013  . Zoster 01/12/2012   Keep all routine maintenance appointments.   Follow up 07/19/20 @ 3:00   Advanced directives: on file  Conditions/risks identified: none new  Follow up in one year for your annual wellness visit    Preventive Care 65 Years and Older, Female Preventive care refers to lifestyle choices and visits with your health care provider that can promote health and wellness. What does preventive care include?  A yearly physical exam. This is also called an annual well check.  Dental exams once or twice a year.  Routine eye exams. Ask your health care provider how often you should have your eyes checked.  Personal lifestyle choices, including:  Daily care of your teeth and gums.  Regular physical activity.  Eating a  healthy diet.  Avoiding tobacco and drug use.  Limiting alcohol use.  Practicing safe sex.  Taking low-dose aspirin every day.  Taking vitamin and mineral supplements as recommended by your health care provider. What happens during an annual well check? The services and screenings done by your health care provider during your annual well check will depend on your age, overall health, lifestyle risk factors, and family history of disease. Counseling  Your health care provider may ask you questions about your:  Alcohol use.  Tobacco use.  Drug use.  Emotional well-being.  Home and relationship well-being.  Sexual activity.  Eating habits.  History of falls.  Memory and ability to understand (cognition).  Work and work Statistician.  Reproductive health. Screening  You may have the following tests or measurements:  Height, weight, and BMI.  Blood pressure.  Lipid and cholesterol levels. These may be checked every 5 years, or more frequently if you are over 83 years old.  Skin check.  Lung cancer screening. You may have this screening every year starting at age 83 if you have a 30-pack-year history of smoking and currently smoke or have quit within the past 15 years.  Fecal occult blood test (FOBT) of the stool. You may have this test every year starting at age 83.  Flexible sigmoidoscopy or colonoscopy. You may have a sigmoidoscopy every 5 years or a colonoscopy every 10 years starting at age 83.  Hepatitis C blood test.  Hepatitis B blood test.  Sexually transmitted disease (STD) testing.  Diabetes screening. This is  done by checking your blood sugar (glucose) after you have not eaten for a while (fasting). You may have this done every 1-3 years.  Bone density scan. This is done to screen for osteoporosis. You may have this done starting at age 83.  Mammogram. This may be done every 1-2 years. Talk to your health care provider about how often you should  have regular mammograms. Talk with your health care provider about your test results, treatment options, and if necessary, the need for more tests. Vaccines  Your health care provider may recommend certain vaccines, such as:  Influenza vaccine. This is recommended every 83 year.  Tetanus, diphtheria, and acellular pertussis (Tdap, Td) vaccine. You may need a Td booster every 10 years.  Zoster vaccine. You may need this after age 83.  Pneumococcal 13-valent conjugate (PCV13) vaccine. One dose is recommended after age 83.  Pneumococcal polysaccharide (PPSV23) vaccine. One dose is recommended after age 83. Talk to your health care provider about which screenings and vaccines you need and how often you need them. This information is not intended to replace advice given to you by your health care provider. Make sure you discuss any questions you have with your health care provider. Document Released: 09/20/2015 Document Revised: 05/13/2016 Document Reviewed: 06/25/2015 Elsevier Interactive Patient Education  2017 [employer] Prevention in the Home Falls can cause injuries. They can happen to people of all ages. There are many things you can do to make your home safe and to help prevent falls. What can I do on the outside of my home?  Regularly fix the edges of walkways and driveways and fix any cracks.  Remove anything that might make you trip as you walk through a door, such as a raised step or threshold.  Trim any bushes or trees on the path to your home.  Use bright outdoor lighting.  Clear any walking paths of anything that might make someone trip, such as rocks or tools.  Regularly check to see if handrails are loose or broken. Make sure that both sides of any steps have handrails.  Any raised decks and porches should have guardrails on the edges.  Have any leaves, snow, or ice cleared regularly.  Use sand or salt on walking paths during winter.  Clean up any spills in  your garage right away. This includes oil or grease spills. What can I do in the bathroom?  Use night lights.  Install grab bars by the toilet and in the tub and shower. Do not use towel bars as grab bars.  Use non-skid mats or decals in the tub or shower.  If you need to sit down in the shower, use a plastic, non-slip stool.  Keep the floor dry. Clean up any water that spills on the floor as soon as it happens.  Remove soap buildup in the tub or shower regularly.  Attach bath mats securely with double-sided non-slip rug tape.  Do not have throw rugs and other things on the floor that can make you trip. What can I do in the bedroom?  Use night lights.  Make sure that you have a light by your bed that is easy to reach.  Do not use any sheets or blankets that are too big for your bed. They should not hang down onto the floor.  Have a firm chair that has side arms. You can use this for support while you get dressed.  Do not have throw rugs and other things  on the floor that can make you trip. What can I do in the kitchen?  Clean up any spills right away.  Avoid walking on wet floors.  Keep items that you use a lot in easy-to-reach places.  If you need to reach something above you, use a strong step stool that has a grab bar.  Keep electrical cords out of the way.  Do not use floor polish or wax that makes floors slippery. If you must use wax, use non-skid floor wax.  Do not have throw rugs and other things on the floor that can make you trip. What can I do with my stairs?  Do not leave any items on the stairs.  Make sure that there are handrails on both sides of the stairs and use them. Fix handrails that are broken or loose. Make sure that handrails are as long as the stairways.  Check any carpeting to make sure that it is firmly attached to the stairs. Fix any carpet that is loose or worn.  Avoid having throw rugs at the top or bottom of the stairs. If you do have  throw rugs, attach them to the floor with carpet tape.  Make sure that you have a light switch at the top of the stairs and the bottom of the stairs. If you do not have them, ask someone to add them for you. What else can I do to help prevent falls?  Wear shoes that:  Do not have high heels.  Have rubber bottoms.  Are comfortable and fit you well.  Are closed at the toe. Do not wear sandals.  If you use a stepladder:  Make sure that it is fully opened. Do not climb a closed stepladder.  Make sure that both sides of the stepladder are locked into place.  Ask someone to hold it for you, if possible.  Clearly mark and make sure that you can see:  Any grab bars or handrails.  First and last steps.  Where the edge of each step is.  Use tools that help you move around (mobility aids) if they are needed. These include:  Canes.  Walkers.  Scooters.  Crutches.  Turn on the lights when you go into a dark area. Replace any light bulbs as soon as they burn out.  Set up your furniture so you have a clear path. Avoid moving your furniture around.  If any of your floors are uneven, fix them.  If there are any pets around you, be aware of where they are.  Review your medicines with your doctor. Some medicines can make you feel dizzy. This can increase your chance of falling. Ask your doctor what other things that you can do to help prevent falls. This information is not intended to replace advice given to you by your health care provider. Make sure you discuss any questions you have with your health care provider. Document Released: 06/20/2009 Document Revised: 01/30/2016 Document Reviewed: 09/28/2014 Elsevier Interactive Patient Education  2017 [employer].

## 2020-04-04 DIAGNOSIS — H2511 Age-related nuclear cataract, right eye: Secondary | ICD-10-CM | POA: Diagnosis not present

## 2020-04-04 DIAGNOSIS — H25011 Cortical age-related cataract, right eye: Secondary | ICD-10-CM | POA: Diagnosis not present

## 2020-04-09 ENCOUNTER — Other Ambulatory Visit: Payer: Self-pay | Admitting: Hematology and Oncology

## 2020-04-09 ENCOUNTER — Telehealth: Payer: Self-pay

## 2020-04-09 ENCOUNTER — Inpatient Hospital Stay: Payer: PPO | Attending: Hematology and Oncology

## 2020-04-09 ENCOUNTER — Encounter: Payer: Self-pay | Admitting: Hematology and Oncology

## 2020-04-09 ENCOUNTER — Other Ambulatory Visit: Payer: Self-pay

## 2020-04-09 ENCOUNTER — Inpatient Hospital Stay (HOSPITAL_BASED_OUTPATIENT_CLINIC_OR_DEPARTMENT_OTHER): Payer: PPO | Admitting: Hematology and Oncology

## 2020-04-09 VITALS — BP 137/62 | HR 67 | Temp 97.2°F | Resp 18 | Ht 63.75 in | Wt 147.5 lb

## 2020-04-09 DIAGNOSIS — M8580 Other specified disorders of bone density and structure, unspecified site: Secondary | ICD-10-CM | POA: Diagnosis not present

## 2020-04-09 DIAGNOSIS — Z170 Estrogen receptor positive status [ER+]: Secondary | ICD-10-CM | POA: Insufficient documentation

## 2020-04-09 DIAGNOSIS — M3500 Sicca syndrome, unspecified: Secondary | ICD-10-CM | POA: Diagnosis not present

## 2020-04-09 DIAGNOSIS — Z79811 Long term (current) use of aromatase inhibitors: Secondary | ICD-10-CM | POA: Diagnosis not present

## 2020-04-09 DIAGNOSIS — M85851 Other specified disorders of bone density and structure, right thigh: Secondary | ICD-10-CM

## 2020-04-09 DIAGNOSIS — C50912 Malignant neoplasm of unspecified site of left female breast: Secondary | ICD-10-CM

## 2020-04-09 DIAGNOSIS — Z800 Family history of malignant neoplasm of digestive organs: Secondary | ICD-10-CM | POA: Insufficient documentation

## 2020-04-09 DIAGNOSIS — D72819 Decreased white blood cell count, unspecified: Secondary | ICD-10-CM | POA: Diagnosis not present

## 2020-04-09 DIAGNOSIS — Z86010 Personal history of colonic polyps: Secondary | ICD-10-CM | POA: Diagnosis not present

## 2020-04-09 DIAGNOSIS — I252 Old myocardial infarction: Secondary | ICD-10-CM | POA: Insufficient documentation

## 2020-04-09 DIAGNOSIS — Z9012 Acquired absence of left breast and nipple: Secondary | ICD-10-CM | POA: Insufficient documentation

## 2020-04-09 DIAGNOSIS — Z87440 Personal history of urinary (tract) infections: Secondary | ICD-10-CM | POA: Diagnosis not present

## 2020-04-09 DIAGNOSIS — D649 Anemia, unspecified: Secondary | ICD-10-CM

## 2020-04-09 DIAGNOSIS — D692 Other nonthrombocytopenic purpura: Secondary | ICD-10-CM

## 2020-04-09 DIAGNOSIS — R590 Localized enlarged lymph nodes: Secondary | ICD-10-CM | POA: Diagnosis not present

## 2020-04-09 DIAGNOSIS — R233 Spontaneous ecchymoses: Secondary | ICD-10-CM | POA: Insufficient documentation

## 2020-04-09 DIAGNOSIS — Z85850 Personal history of malignant neoplasm of thyroid: Secondary | ICD-10-CM | POA: Diagnosis not present

## 2020-04-09 LAB — COMPREHENSIVE METABOLIC PANEL
ALT: 11 U/L (ref 0–44)
AST: 22 U/L (ref 15–41)
Albumin: 3.7 g/dL (ref 3.5–5.0)
Alkaline Phosphatase: 79 U/L (ref 38–126)
Anion gap: 8 (ref 5–15)
BUN: 16 mg/dL (ref 8–23)
CO2: 28 mmol/L (ref 22–32)
Calcium: 8.6 mg/dL — ABNORMAL LOW (ref 8.9–10.3)
Chloride: 93 mmol/L — ABNORMAL LOW (ref 98–111)
Creatinine, Ser: 0.63 mg/dL (ref 0.44–1.00)
GFR calc Af Amer: >60 mL/min (ref >60)
GFR calc non Af Amer: >60 mL/min (ref >60)
Glucose, Bld: 102 mg/dL — ABNORMAL HIGH (ref 70–99)
Potassium: 4.6 mmol/L (ref 3.5–5.1)
Sodium: 129 mmol/L — ABNORMAL LOW (ref 135–145)
Total Bilirubin: 0.9 mg/dL (ref 0.3–1.2)
Total Protein: 6.8 g/dL (ref 6.5–8.1)

## 2020-04-09 LAB — CBC WITH DIFFERENTIAL/PLATELET
Abs Immature Granulocytes: 0.11 10*3/uL — ABNORMAL HIGH (ref 0.00–0.07)
Basophils Absolute: 0.1 10*3/uL (ref 0.0–0.1)
Basophils Relative: 2 %
Eosinophils Absolute: 0.2 10*3/uL (ref 0.0–0.5)
Eosinophils Relative: 6 %
HCT: 30.7 % — ABNORMAL LOW (ref 36.0–46.0)
Hemoglobin: 10.6 g/dL — ABNORMAL LOW (ref 12.0–15.0)
Immature Granulocytes: 3 %
Lymphocytes Relative: 21 %
Lymphs Abs: 0.8 10*3/uL (ref 0.7–4.0)
MCH: 33.8 pg (ref 26.0–34.0)
MCHC: 34.5 g/dL (ref 30.0–36.0)
MCV: 97.8 fL (ref 80.0–100.0)
Monocytes Absolute: 0.4 10*3/uL (ref 0.1–1.0)
Monocytes Relative: 9 %
Neutro Abs: 2.4 10*3/uL (ref 1.7–7.7)
Neutrophils Relative %: 59 %
Platelets: 147 10*3/uL — ABNORMAL LOW (ref 150–400)
RBC: 3.14 MIL/uL — ABNORMAL LOW (ref 3.87–5.11)
RDW: 16.3 % — ABNORMAL HIGH (ref 11.5–15.5)
WBC: 4 10*3/uL (ref 4.0–10.5)
nRBC: 0 % (ref 0.0–0.2)

## 2020-04-09 LAB — FERRITIN: Ferritin: 264 ng/mL (ref 11–307)

## 2020-04-09 LAB — PLATELET FUNCTION ASSAY: Collagen / Epinephrine: 155 seconds (ref 0–193)

## 2020-04-09 NOTE — Progress Notes (Signed)
[facility]  [address], [address] [place], [place] [address] Phone: [phone]  Fax: [phone]   Clinic Day:  04/09/2020  Referring physician: [name], MD  Chief Complaint: Kim Santiago is a 85 y.o. female with stage IIB left breast cancer on letrozole who is seen for 6 month assessment.  HPI: The patient was last seen in the medical oncology clinic on 10/11/2019 via telemedicine. At that time, she denied any breast concerns.  She had bruising.  She was not on aspirin. Hematocrit was 32.6, hemoglobin 10.8, MCV 100.6, platelets 122,000, WBC 3,600 (ANC 1,600). Ferritin was 237. INR was 1.0 and PTT 29.  She continued Femara.  Screening right mammogram on 02/29/2020 revealed no evidence of malignancy.  During the interim, she has been "pretty good." She still bruises easily and has many bruises on her arms and chest. She has not been using Aspirin or Ibuprofen. She is still on Femara. She performs monthly breast self-exams and has no concerns. Denies any recent infections.  The patient notes that she has been unstable on her feet lately and has been losing her balance. She has not fallen. She states that she needs to go to physical therapy but has not because of COVID-19.  She recently had cataract surgery and is using three types of eye drops. One is an antibiotic, one is an anti-inflammatory, and the last might be a steroid, but she is not sure.   The patient has never been on Prolia. She states that her dentist would not clear her.  The patient is interested in information about BCI testing.   Past Medical History:  Diagnosis Date  . Arthritis   . Breast cancer ([place]) 2017   left mastectomy done 11/2015  . Breast cancer in female [facility]) 11/18/2015   Left: 3.9 cm tumor, T2, 1/2 sentinel nodes positive for macro metastatic disease, N1, 3 negative nodes in the axillary tail, ER+,PR+, Her 2 neu, low Mammoprint score  . Cancer [facility])    thyroid  takes levothyroxine  . Chronic kidney disease    UTI  . Genetic screening March 2017.   Mammoprint of left breast cancer: Low risk for recurrence.   Marland Kitchen History of heart attack 06/12/2014  . Hypertension   . hypothyroidism    secondary to thyroidectomy for thyroid ca  . Hypothyroidism   . Lupus ([facility])    subcutaneous  . Menopause 40s   natural, hot flashes and mood lability now gone, off prempro 7 months  . Myocardial infarction ([place]) 2013  . Osteoporosis    Osteopenia  . Rosacea   . Sjoegren syndrome   . Stress-induced cardiomyopathy September of 2013   EF 35%. Peak troponin was 1.8.    Past Surgical History:  Procedure Laterality Date  . BACK SURGERY    . BREAST BIOPSY Left 10/30/15   positive, done in Dr. Dwyane Luo office  . CARDIAC CATHETERIZATION  05/2012   [facility]. No significant CAD. Ejection fraction of 35% due to stress-induced cardiomyopathy.  . CHOLECYSTECTOMY    . COLONOSCOPY    . DILATION AND CURETTAGE OF UTERUS    . KYPHOSIS SURGERY  Feb 2008   L1, Dr. Mauri Pole  . LUMBAR DISC SURGERY     L4-L5  . MASTECTOMY Left 11/18/2015   positive  . SENTINEL NODE BIOPSY Left 11/18/2015   Procedure: SENTINEL NODE BIOPSY;  Surgeon: Robert Bellow, MD;  Location: [facility] ORS;  Service: General;  Laterality: Left;  . SHOULDER ARTHROSCOPY  2004   Left, Dr. Jefm Bryant  . SIMPLE MASTECTOMY WITH AXILLARY SENTINEL NODE BIOPSY Left 11/18/2015   Procedure: SIMPLE MASTECTOMY;  Surgeon: Robert Bellow, MD;  Location: [facility] ORS;  Service: General;  Laterality: Left;  . SPINE SURGERY     L4-5 diskectomy  . THYROIDECTOMY     Thyroid Cancer  . TONSILLECTOMY    . TUBAL LIGATION      Family History  Problem Relation Age of Onset  . COPD Father   . Cancer Father        esophageal  . Kidney disease Mother   . Heart disease Mother   . Kidney disease Sister   . Cancer Brother 59       colon cancer (both brothers)  . Heart attack Brother 53  . Heart disease Brother   . Breast cancer Neg  Hx     Social History:  reports that she has never smoked. She has never used smokeless tobacco. She reports that she does not drink alcohol and does not use drugs. She is from [place], [place]. She was married for 69 years. She has an adopted daughter and 2 grandchildren.Her brother died of a MI following a cholecystectomy in 12/2015. The patient is living at her daughter's house since her CVA in 11/2018.  The patient is alone today  Allergies:  Allergies  Allergen Reactions  . Azathioprine Nausea And Vomiting    Severe vomiting  . Hydroxychloroquine Hives and Nausea And Vomiting  . Mycophenolate Mofetil Nausea Only  . Amoxicillin Rash    Has patient had a PCN reaction causing immediate rash, facial/tongue/throat swelling, SOB or lightheadedness with hypotension: Unknown Has patient had a PCN reaction causing severe rash involving mucus membranes or skin necrosis: Unknown Has patient had a PCN reaction that required hospitalization: Unknown Has patient had a PCN reaction occurring within the last 10 years: Unknown If all of the above answers are "NO", then may proceed with Cephalosporin use. Tolerates ceftriaxone and Ancef  . Codeine Nausea And Vomiting  . Naprosyn [Naproxen] Swelling  . Orudis [Ketoprofen] Hives  . Sulfa Antibiotics Rash  . Sulfathiazole Rash    Current Medications: Current Outpatient Medications  Medication Sig Dispense Refill  . atorvastatin (LIPITOR) 20 MG tablet TAKE 1 TABLET BY MOUTH DAILY AT 6PM 90 tablet 1  . BESIVANCE 0.6 % SUSP Place 1 drop into the left eye 3 (three) times daily.    . betamethasone dipropionate (DIPROLENE) 0.05 % ointment Apply topically 2 (two) times daily.    . Calcium Carbonate-Vitamin D (CALCIUM + D) 600-200 MG-UNIT TABS Take 2 tablets by mouth daily.     . cholecalciferol (VITAMIN D) 1000 UNITS tablet Take 2,000 Units by mouth daily.     . clobetasol ointment (TEMOVATE) 0.05 % Apply topically.    Marland Kitchen CRANBERRY PO Take 1 capsule by  mouth 2 (two) times daily.    . DUREZOL 0.05 % EMUL Place 1 drop into the right eye 3 (three) times daily.    . furosemide (LASIX) 20 MG tablet 1 TABLET EVERY OTHER DAY AS NEEDED 45 tablet 2  . letrozole (FEMARA) 2.5 MG tablet Take 1 tablet (2.5 mg total) by mouth daily. 90 tablet 3  . levothyroxine (SYNTHROID) 75 MCG tablet TAKE 1 TABLET BY MOUTH (75MCG TOTAL) DAILY 90 tablet 2  . losartan (COZAAR) 25 MG tablet TAKE 1 TABLET BY MOUTH EVERY DAY 90 tablet 1  . metolazone (ZAROXOLYN) 2.5 MG tablet Take 1 tablet (2.5 mg total) by  mouth daily. 30 minutes before furosemide dose 30 tablet 1  . metoprolol succinate (TOPROL-XL) 25 MG 24 hr tablet Take 1 tablet by mouth every day 90 tablet 3  . omeprazole (PRILOSEC) 20 MG capsule Take 1 capsule by mouth twice a day before a meal 180 capsule 0  . polyethylene glycol (MIRALAX / GLYCOLAX) packet Take 17 g by mouth daily.    . Probiotic Product (PROBIOTIC PO) Take 1 tablet by mouth daily.    Marland Kitchen PROLENSA 0.07 % SOLN SMARTSIG:1 Drop(s) Left Eye Every Evening    . sertraline (ZOLOFT) 25 MG tablet TAKE 1 TABLET BY MOUTH EVERY DAY 90 tablet 1  . tizanidine (ZANAFLEX) 2 MG capsule Take 1 capsule (2 mg total) by mouth 3 (three) times daily. 90 capsule 0  . traMADol (ULTRAM) 50 MG tablet Take 1 tablet (50 mg total) by mouth every 8 (eight) hours as needed. 60 tablet 2  . triamcinolone ointment (KENALOG) 0.1 % Apply 1 application topically daily.  0  . metroNIDAZOLE (METROCREAM) 0.75 % cream Apply 1 application topically 2 (two) times daily. (Patient not taking: Reported on 04/09/2020)    . nitroGLYCERIN (NITROSTAT) 0.4 MG SL tablet Place 1 tablet (0.4 mg total) under the tongue every 5 (five) minutes as needed for chest pain. Maximum 3 doses (Patient not taking: Reported on 04/09/2020) 50 tablet 3  . ondansetron (ZOFRAN ODT) 4 MG disintegrating tablet Take 1 tablet (4 mg total) by mouth every 8 (eight) hours as needed for nausea or vomiting. (Patient not taking: Reported on  10/09/2019) 20 tablet 0  . sodium fluoride (DENTA 5000 PLUS) 1.1 % CREA dental cream Take 1 application by mouth daily. (Patient not taking: Reported on 04/09/2020)     No current facility-administered medications for this visit.    Review of Systems  Constitutional: Positive for malaise/fatigue. Negative for chills, diaphoresis and fever.       Feeling "pretty good".  HENT: Positive for hearing loss. Negative for congestion, ear discharge, ear pain, nosebleeds, sinus pain, sore throat and tinnitus.        Xerostomia due to Sjogren's.  Eyes: Negative for double vision, photophobia and pain.       Recent cataract surgery  Respiratory: Negative.  Negative for cough, hemoptysis, sputum production and shortness of breath.   Cardiovascular: Negative.  Negative for chest pain, palpitations, orthopnea, leg swelling and PND.  Gastrointestinal: Negative.  Negative for abdominal pain, blood in stool, constipation, diarrhea, heartburn, melena, nausea and vomiting.  Genitourinary: Negative.  Negative for dysuria, frequency, hematuria and urgency.  Musculoskeletal: Positive for joint pain. Negative for back pain, falls, myalgias and neck pain.  Skin: Negative for itching and rash.  Neurological: Negative for dizziness, tingling, tremors, sensory change, speech change, focal weakness, seizures, loss of consciousness, weakness and headaches.       Unstable on her feet.  Endo/Heme/Allergies: Bruises/bleeds easily (bruising on arms and chest).       HYPOthyroidism on levothyroxine.  Psychiatric/Behavioral: Negative.  Negative for depression and memory loss. The patient is not nervous/anxious and does not have insomnia.   All other systems reviewed and are negative.  Performance status (ECOG):  1  Vitals Blood pressure 137/62, pulse 67, temperature (!) 97.2 F (36.2 C), temperature source Tympanic, resp. rate 18, height 5' 3.75" (1.619 m), weight 147 lb 7.8 oz (66.9 kg), SpO2 100 %.   Physical Exam Vitals  and nursing note reviewed.  Constitutional:      General: She is not in acute  distress.    Appearance: Normal appearance. She is normal weight. She is not ill-appearing.  HENT:     Head: Normocephalic and atraumatic.     Comments: Short styled gray hair.    Mouth/Throat:     Mouth: Mucous membranes are moist.     Pharynx: Oropharynx is clear. No oropharyngeal exudate or posterior oropharyngeal erythema.  Eyes:     General: No scleral icterus.    Extraocular Movements: Extraocular movements intact.     Conjunctiva/sclera: Conjunctivae normal.     Pupils: Pupils are equal, round, and reactive to light.     Comments: Brown eyes.  Cardiovascular:     Rate and Rhythm: Normal rate and regular rhythm.     Pulses: Normal pulses.     Heart sounds: No murmur heard.  No gallop.   Pulmonary:     Effort: Pulmonary effort is normal. No respiratory distress.     Breath sounds: Normal breath sounds. No wheezing, rhonchi or rales.  Chest:     Breasts:        Left: Absent.     Comments: Right breast with fibrocystic changes at 3 o'clock and 12 o'clock with associated tenderness.  Left mastectomy without erythema or nodularity. Abdominal:     General: Bowel sounds are normal. There is no distension.     Palpations: Abdomen is soft. There is no hepatomegaly, splenomegaly or mass.     Tenderness: There is no abdominal tenderness.  Musculoskeletal:        General: No swelling.     Cervical back: Normal range of motion.     Right lower leg: No edema.     Left lower leg: No edema.  Lymphadenopathy:     Cervical: No cervical adenopathy.  Skin:    General: Skin is warm and dry.     Comments: Multiple small areas of ecchymosis on her arms.  Neurological:     General: No focal deficit present.     Mental Status: She is alert and oriented to person, place, and time. Mental status is at baseline.     Cranial Nerves: No cranial nerve deficit.  Psychiatric:        Mood and Affect: Mood normal.         Behavior: Behavior normal.        Thought Content: Thought content normal.        Judgment: Judgment normal.     Appointment on 04/09/2020  Component Date Value Ref Range Status  . WBC 04/09/2020 4.0  4.0 - 10.5 K/uL Final  . RBC 04/09/2020 3.14* 3.87 - 5.11 MIL/uL Final  . Hemoglobin 04/09/2020 10.6* 12.0 - 15.0 g/dL Final  . HCT 04/09/2020 30.7* 36 - 46 % Final  . MCV 04/09/2020 97.8  80.0 - 100.0 fL Final  . MCH 04/09/2020 33.8  26.0 - 34.0 pg Final  . MCHC 04/09/2020 34.5  30.0 - 36.0 g/dL Final  . RDW 04/09/2020 16.3* 11.5 - 15.5 % Final  . Platelets 04/09/2020 147* 150 - 400 K/uL Final  . nRBC 04/09/2020 0.0  0.0 - 0.2 % Final  . Neutrophils Relative % 04/09/2020 59  % Final  . Neutro Abs 04/09/2020 2.4  1.7 - 7.7 K/uL Final  . Lymphocytes Relative 04/09/2020 21  % Final  . Lymphs Abs 04/09/2020 0.8  0.7 - 4.0 K/uL Final  . Monocytes Relative 04/09/2020 9  % Final  . Monocytes Absolute 04/09/2020 0.4  0 - 1 K/uL Final  . Eosinophils Relative  04/09/2020 6  % Final  . Eosinophils Absolute 04/09/2020 0.2  0 - 0 K/uL Final  . Basophils Relative 04/09/2020 2  % Final  . Basophils Absolute 04/09/2020 0.1  0 - 0 K/uL Final  . Immature Granulocytes 04/09/2020 3  % Final  . Abs Immature Granulocytes 04/09/2020 0.11* 0.00 - 0.07 K/uL Final   Performed at [facility], [address], [place],  [address]  . Sodium 04/09/2020 129* 135 - 145 mmol/L Final  . Potassium 04/09/2020 4.6  3.5 - 5.1 mmol/L Final  . Chloride 04/09/2020 93* 98 - 111 mmol/L Final  . CO2 04/09/2020 28  22 - 32 mmol/L Final  . Glucose, Bld 04/09/2020 102* 70 - 99 mg/dL Final   Glucose reference range applies only to samples taken after fasting for at least 8 hours.  . BUN 04/09/2020 16  8 - 23 mg/dL Final  . Creatinine, Ser 04/09/2020 0.63  0.44 - 1.00 mg/dL Final  . Calcium 04/09/2020 8.6* 8.9 - 10.3 mg/dL Final  . Total Protein 04/09/2020 6.8  6.5 - 8.1 g/dL Final  . Albumin 04/09/2020 3.7   3.5 - 5.0 g/dL Final  . AST 04/09/2020 22  15 - 41 U/L Final  . ALT 04/09/2020 11  0 - 44 U/L Final  . Alkaline Phosphatase 04/09/2020 79  38 - 126 U/L Final  . Total Bilirubin 04/09/2020 0.9  0.3 - 1.2 mg/dL Final  . GFR calc non Af Amer 04/09/2020 >60  >60 mL/min Final  . GFR calc Af Amer 04/09/2020 >60  >60 mL/min Final  . Anion gap 04/09/2020 8  5 - 15 Final   Performed at [facility], [address] [place],  [address]    Assessment:  Kim Santiago is a 85 y.o. female  stage IIB left breast cancers/p mastectomy on 11/18/2015. Pathology revealed a 3.9 cm grade II invasive mammary carcinoma. There was ductal carcinoma in situ (DCIS). There was lymphvascular and dermal lymphatic invasion. Margins were negative. One of 2 sentinel lymph nodes were positive for macrometastasis. In addition, there were 3 benign axillary lymph nodes (total 5 lymph nodes). Tumor was estrogen receptor positive (100%), progesterone receptor positive (70%), and Her2/neu negative. Pathologic stage was T2N1a.   Bilateral mammogram and ultrasound on 11/11/2015 revealed a 3 cm left breast mass at the 6 o'clock position (1 cm from the nipple) and a 1.6 cm irregular hypoechoicleft breast mass at 1 o'clock position (5 cm from the nipple) with overlying skin thickening. Right mammogramon 11/18/2016 revealed no evidence of malignancy. Right mammogramon 11/23/2017 revealed no evidence of malignancy.  Right mammogram on 02/28/2019 revealed no evidence of malignancy.  Screening right mammogram on 02/29/2020 revealed no evidence of malignancy.  Mamma[place] 12/18/2015 revealed a low risk of recurrence with a 5% risk of distant recurrence of 5 years and a 10% recurrence at 10 years.  She declined radiation. She began [facility] 01/09/2016. She has a little joint pain and a few hot flashes.  PET scanon 12/23/2016 revealed slightly enlarged right lower paratracheal lymph node and some  clustered small AP window lymph nodes with metabolic activity very minimally above background in the mediastinum. There were no other findings of hypermetabolic lesions in the neck, chest, abdomen, or pelvis. Chest CTon 03/16/2017 revealed stable mild mediastinal lymphadenopathy. Largest node was 1.0 cm in the right paratracheal region. There was no new or progressive disease within thorax. Chest CTon 09/23/2017 revealed stable mild mediastinal adenopathy (1.1 cm precarinal and  0.9 cm pretracheal lymph node). There was no new or progressive disease. Chest CTon 09/26/2018 revealed a stable exam.There was no change in the previous described mediastinal lymph nodes.There were stable tiny right lung nodules consistent with benign etiology.  Chest CT on 09/28/2019 revealed no acute cardiopulmonary disease.  There was subtle chronic stable bibasilar interstitial prominence.  There were a couple of tiny stable right lung nodules with the larger measuring 4 mm unchanged for 2 years and therefore considered benign.  There was a stable 1.1 cm precarinal lymph node likely reactive.  There was mild anterior wedging of 2 adjacent midthoracic vertebral bodies unchanged and previous kyphoplasty in the region of the thoracolumbar junction, unchanged.  CA27.29 has been followed: 30.4 on 11/15/2015, 32.5 on 05/13/2016, 19.0 on 08/12/2016, 40.8 on 11/11/2016, 45.8 on 12/14/2016, 31.6 on 03/17/2017, 45.7 on 07/14/2017, 43.4 on 12/08/2017, 33.2 on 04/27/2018, 31.3 on 08/24/2018, 43.1 on 03/01/2019, 35.2 on 04/03/2019, 34.0 on 05/31/2019, 29.1 on 09/05/2019, and 25.0 on 04/09/2020.  Bone density on 12/05/2015 revealed osteopeniawith a T-score of -1.5 in the left femoral neck. Bone density on 12/07/2017 revealed osteopenia with a T-score of -1.6 in the right femoral neck. She is on calcium and vitamin D.  She has a history of a myocardial infarctionin 06/2012. Echocardiogram revealed an EF of 35%. Follow-up  echo on 09/21/2015 revealed an EF of 55-60%. Patient is unaware of a history of heart failure. She has a history of thyroid carcinoma40 years ago treated with I-131.  She has a mild normocytic anemia. Diet is good. Colonoscopyon 07/25/2013 revealed 3 small polyps (22m proximal descending colon, 3 mm splenic flexure, 1 mm ascending colon). She denies any melena or hematochezia. TSH was normal on 12/26/2015 and 08/24/2018.Copper was normal on 08/24/2018.  She has cutaneous lupusand Sjogren's. She had a reaction to Plaquenil. She completed an extended steroid taper. She is on methotrexate8 tablets every Wednesday (increased 3-4 weeks ago).  [place] [facility]. She presented with left sided focal numbness and weakness. Head MRI and MRAwithout contrast revealed an acute versus subacute faint right almost nonhemorrhagic infarct. There was moderate to severe chronic small vessel ischemic changes. There was no large vesselflow-limiting stenosis. Bilateralcarotid ultrasound revealed<50% stenosis in the right and left internal carotid arteries  Symptomatically, she feels pretty good.  Exam feels fibrocystic changes in the right breast.  She likely has senile ecchymosis  Plan: 1.   Labs today: CBC with diff, CMP, CA 27.29, ferritin, platelet function assay 2.Stage IIB left breast cancer Clinically,  she is doing well Exam reveals no evidence of recurrent disease. [place] mammogram on 02/29/2020 reveals no evidence of recurrence. CA 27.29 is normal.   Continue Femara (began 01/09/2016).  Discuss BCI testing.  Patient requests further information. 3.Leukopenia, resolved WBC 4000 with an ANC of 2400. Etiology felt secondary to lupus. TSH and copper are normal. Flow cytometry on04/13/2020 revealed noabnormality.  She has had no infections 4.Anemia  Hematocrit 30.7.  Hemoglobin 1.6.  MCV 97.8. B12 was 904 and folate 14.5  on 03/01/2019. Ferritin 264 today. Suspect anemia of chronic disease. 5.Mediastinal adenopathy Chest CT on 09/26/2018 revealed stable mediastinal adenopathy. Chest CT on 09/28/2019 revealed subtle chronic stable bibasilar interstitial prominence.    There were a couple of tiny stable right lung nodules with the largest 4 mm unchanged for 2 years (benign).    There was a stable 1.1 cm precarinal lymph node, likely reactive.    There was mild anterior  wedging of 2 adjacent midthoracic vertebral bodies unchanged. 6.Osteopenia Continue calcium and vitamin D. Patient's dentist declines parents for Prolia. Follow-up bone density. 7.   Ecchymosis  Patient off aspirin.  Check platelet function assay.  Etiology felt secondary to senile ecchymosis. 8.   Bone density next week. 9.   RN:  patient requests information on BCI. 10.   RTC in 6 months for MD assessment, labs (CBC with diff, CMP, CA 27.29, ferritin) and review of bone density.   I discussed the assessment and treatment plan with the patient.  The patient was provided an opportunity to ask questions and all were answered.  The patient agreed with the plan and demonstrated an understanding of the instructions.  The patient was advised to call back if the symptoms worsen or if the condition fails to improve as anticipated.   Lequita Asal, MD, PhD 04/09/2020, 10:24 AM  I, Mirian Mo Tufford, am acting as a Education administrator for Lequita Asal, MD.  I, [place] Mike Gip, MD, have reviewed the above documentation for accuracy and completeness, and I agree with the above.

## 2020-04-09 NOTE — Patient Instructions (Signed)
Denosumab injection What is this medicine? DENOSUMAB (den oh sue mab) slows bone breakdown. Prolia is used to treat osteoporosis in women after menopause and in men, and in people who are taking corticosteroids for 6 months or more. Xgeva is used to treat a high calcium level due to cancer and to prevent bone fractures and other bone problems caused by multiple myeloma or cancer bone metastases. Xgeva is also used to treat giant cell tumor of the bone. This medicine may be used for other purposes; ask your health care provider or pharmacist if you have questions. COMMON BRAND NAME(S): Prolia, XGEVA What should I tell my health care provider before I take this medicine? They need to know if you have any of these conditions:  dental disease  having surgery or tooth extraction  infection  kidney disease  low levels of calcium or Vitamin D in the blood  malnutrition  on hemodialysis  skin conditions or sensitivity  thyroid or parathyroid disease  an unusual reaction to denosumab, other medicines, foods, dyes, or preservatives  pregnant or trying to get pregnant  breast-feeding How should I use this medicine? This medicine is for injection under the skin. It is given by a health care professional in a hospital or clinic setting. A special MedGuide will be given to you before each treatment. Be sure to read this information carefully each time. For Prolia, talk to your pediatrician regarding the use of this medicine in children. Special care may be needed. For Xgeva, talk to your pediatrician regarding the use of this medicine in children. While this drug may be prescribed for children as young as 13 years for selected conditions, precautions do apply. Overdosage: If you think you have taken too much of this medicine contact a poison control center or emergency room at once. NOTE: This medicine is only for you. Do not share this medicine with others. What if I miss a dose? It is  important not to miss your dose. Call your doctor or health care professional if you are unable to keep an appointment. What may interact with this medicine? Do not take this medicine with any of the following medications:  other medicines containing denosumab This medicine may also interact with the following medications:  medicines that lower your chance of fighting infection  steroid medicines like prednisone or cortisone This list may not describe all possible interactions. Give your health care provider a list of all the medicines, herbs, non-prescription drugs, or dietary supplements you use. Also tell them if you smoke, drink alcohol, or use illegal drugs. Some items may interact with your medicine. What should I watch for while using this medicine? Visit your doctor or health care professional for regular checks on your progress. Your doctor or health care professional may order blood tests and other tests to see how you are doing. Call your doctor or health care professional for advice if you get a fever, chills or sore throat, or other symptoms of a cold or flu. Do not treat yourself. This drug may decrease your body's ability to fight infection. Try to avoid being around people who are sick. You should make sure you get enough calcium and vitamin D while you are taking this medicine, unless your doctor tells you not to. Discuss the foods you eat and the vitamins you take with your health care professional. See your dentist regularly. Brush and floss your teeth as directed. Before you have any dental work done, tell your dentist you are   receiving this medicine. Do not become pregnant while taking this medicine or for 5 months after stopping it. Talk with your doctor or health care professional about your birth control options while taking this medicine. Women should inform their doctor if they wish to become pregnant or think they might be pregnant. There is a potential for serious side  effects to an unborn child. Talk to your health care professional or pharmacist for more information. What side effects may I notice from receiving this medicine? Side effects that you should report to your doctor or health care professional as soon as possible:  allergic reactions like skin rash, itching or hives, swelling of the face, lips, or tongue  bone pain  breathing problems  dizziness  jaw pain, especially after dental work  redness, blistering, peeling of the skin  signs and symptoms of infection like fever or chills; cough; sore throat; pain or trouble passing urine  signs of low calcium like fast heartbeat, muscle cramps or muscle pain; pain, tingling, numbness in the hands or feet; seizures  unusual bleeding or bruising  unusually weak or tired Side effects that usually do not require medical attention (report to your doctor or health care professional if they continue or are bothersome):  constipation  diarrhea  headache  joint pain  loss of appetite  muscle pain  runny nose  tiredness  upset stomach This list may not describe all possible side effects. Call your doctor for medical advice about side effects. You may report side effects to FDA at 1-800-FDA-1088. Where should I keep my medicine? This medicine is only given in a clinic, doctor's office, or other health care setting and will not be stored at home. NOTE: This sheet is a summary. It may not cover all possible information. If you have questions about this medicine, talk to your doctor, pharmacist, or health care provider.  2020 Elsevier/Gold Standard (2017-12-31 16:10:44)

## 2020-04-09 NOTE — Telephone Encounter (Signed)
Labs has been faxed to the patient PCP office.

## 2020-04-09 NOTE — Progress Notes (Signed)
No new changes noted today 

## 2020-04-09 NOTE — Telephone Encounter (Signed)
Routed labs to PCP °

## 2020-04-09 NOTE — Telephone Encounter (Signed)
-----   Message from Lequita Asal, MD sent at 04/09/2020 12:31 PM EDT ----- Regarding: Please send CMP to PCP- low sodium  ----- Message ----- From: Interface, Lab In [place] Sent: 04/09/2020   9:17 AM EDT To: Lequita Asal, MD

## 2020-04-10 LAB — CANCER ANTIGEN 27.29: CA 27.29: 25 U/mL (ref 0.0–38.6)

## 2020-04-17 ENCOUNTER — Other Ambulatory Visit: Payer: Self-pay | Admitting: Internal Medicine

## 2020-04-23 ENCOUNTER — Other Ambulatory Visit: Payer: Self-pay

## 2020-04-23 ENCOUNTER — Ambulatory Visit
Admission: RE | Admit: 2020-04-23 | Discharge: 2020-04-23 | Disposition: A | Discharge status: Home / Self Care | Payer: PPO | Source: Ambulatory Visit | Attending: Hematology and Oncology | Admitting: Hematology and Oncology

## 2020-04-23 DIAGNOSIS — Z85850 Personal history of malignant neoplasm of thyroid: Secondary | ICD-10-CM | POA: Diagnosis not present

## 2020-04-23 DIAGNOSIS — M85851 Other specified disorders of bone density and structure, right thigh: Secondary | ICD-10-CM

## 2020-04-23 DIAGNOSIS — R29890 Loss of height: Secondary | ICD-10-CM | POA: Diagnosis not present

## 2020-04-23 DIAGNOSIS — Z853 Personal history of malignant neoplasm of breast: Secondary | ICD-10-CM | POA: Diagnosis not present

## 2020-05-16 DIAGNOSIS — S51811A Laceration without foreign body of right forearm, initial encounter: Secondary | ICD-10-CM | POA: Diagnosis not present

## 2020-05-16 DIAGNOSIS — W228XXA Striking against or struck by other objects, initial encounter: Secondary | ICD-10-CM | POA: Diagnosis not present

## 2020-05-22 ENCOUNTER — Other Ambulatory Visit: Payer: Self-pay | Admitting: Internal Medicine

## 2020-06-27 ENCOUNTER — Other Ambulatory Visit: Payer: Self-pay | Admitting: Internal Medicine

## 2020-07-18 ENCOUNTER — Other Ambulatory Visit: Payer: Self-pay

## 2020-07-18 ENCOUNTER — Other Ambulatory Visit: Payer: Self-pay | Admitting: Internal Medicine

## 2020-07-18 DIAGNOSIS — E034 Atrophy of thyroid (acquired): Secondary | ICD-10-CM

## 2020-07-19 ENCOUNTER — Ambulatory Visit: Payer: PPO | Admitting: Internal Medicine

## 2020-07-23 ENCOUNTER — Other Ambulatory Visit: Payer: Self-pay

## 2020-07-23 ENCOUNTER — Encounter: Payer: Self-pay | Admitting: Internal Medicine

## 2020-07-23 ENCOUNTER — Ambulatory Visit (INDEPENDENT_AMBULATORY_CARE_PROVIDER_SITE_OTHER): Payer: PPO | Admitting: Internal Medicine

## 2020-07-23 VITALS — BP 146/78 | HR 70 | Temp 98.0°F | Resp 15 | Ht 63.75 in | Wt 142.4 lb

## 2020-07-23 DIAGNOSIS — E782 Mixed hyperlipidemia: Secondary | ICD-10-CM

## 2020-07-23 DIAGNOSIS — I6930 Unspecified sequelae of cerebral infarction: Secondary | ICD-10-CM

## 2020-07-23 DIAGNOSIS — D692 Other nonthrombocytopenic purpura: Secondary | ICD-10-CM | POA: Diagnosis not present

## 2020-07-23 DIAGNOSIS — Z23 Encounter for immunization: Secondary | ICD-10-CM | POA: Diagnosis not present

## 2020-07-23 DIAGNOSIS — I872 Venous insufficiency (chronic) (peripheral): Secondary | ICD-10-CM | POA: Diagnosis not present

## 2020-07-23 DIAGNOSIS — I10 Essential (primary) hypertension: Secondary | ICD-10-CM

## 2020-07-23 DIAGNOSIS — Z0000 Encounter for general adult medical examination without abnormal findings: Secondary | ICD-10-CM | POA: Diagnosis not present

## 2020-07-23 DIAGNOSIS — I69359 Hemiplegia and hemiparesis following cerebral infarction affecting unspecified side: Secondary | ICD-10-CM

## 2020-07-23 DIAGNOSIS — E8809 Other disorders of plasma-protein metabolism, not elsewhere classified: Secondary | ICD-10-CM

## 2020-07-23 DIAGNOSIS — E034 Atrophy of thyroid (acquired): Secondary | ICD-10-CM

## 2020-07-23 MED ORDER — TRAZODONE HCL 50 MG PO TABS: 50.0000 mg | ORAL_TABLET | Freq: Every day | ORAL | 1 refills | Status: DC

## 2020-07-23 NOTE — Assessment & Plan Note (Signed)
Patient is taking her medications as prescribed and notes no adverse effects.  Home BP readings have been done about once per week and are  generally < 130/80 .  She is avoiding added salt in her diet and walking regularly about 3 times per week for exercise  .

## 2020-07-23 NOTE — Assessment & Plan Note (Signed)
Right thalamic ,  Nonhemorrhagic, presenning with left sided weakness and hypertensive crisis. Now taking statin asa and plavix

## 2020-07-23 NOTE — Assessment & Plan Note (Signed)
Encouraged her to increase her protein intake,  And not to use furosemide more than once daily

## 2020-07-23 NOTE — Assessment & Plan Note (Signed)
Advised to continue use of compression knee highs,  Leg elevation and every other day lasix

## 2020-07-23 NOTE — Assessment & Plan Note (Signed)
recurrent finding,  In the setting of drop in platelets..repeat level today is stable  Lab Results  Component Value Date   WBC 4.0 04/09/2020   HGB 10.6 (L) 04/09/2020   HCT 30.7 (L) 04/09/2020   MCV 97.8 04/09/2020   PLT 147 (L) 04/09/2020

## 2020-07-23 NOTE — Patient Instructions (Addendum)
° °  I recommend a trial of trazodone to help you sleep instead of tylenol PM   Start with the 1/2 tablet 1 hor before bedtime.   If not sleepy by bedtime take the other half.

## 2020-07-23 NOTE — Assessment & Plan Note (Signed)

## 2020-07-23 NOTE — Assessment & Plan Note (Addendum)
She has developed weakness in the affected leg and wants to resume PT in [place] .

## 2020-07-23 NOTE — Progress Notes (Signed)
Patient ID: Kim Santiago, female    DOB: 08/20/1935  Age: 85 y.o. MRN: [id number]  The patient is here for  Follow up and management of other chronic and acute problems.   The risk factors are reflected in the social history.  The roster of all physicians providing medical care to patient - is listed in the Snapshot section of the chart.  Activities of daily living:  The patient is 100% independent in all ADLs: dressing, toileting, feeding as well as independent mobility  Home safety : The patient has smoke detectors in the home. They wear seatbelts.  There are no firearms at home. There is no violence in the home.   There is no risks for hepatitis, STDs or HIV. There is no   history of blood transfusion. They have no travel history to infectious disease endemic areas of the world.  The patient has seen their dentist in the last six month. They have seen their eye doctor in the last year. They admit to slight hearing difficulty with regard to whispered voices and some television programs.  They have deferred audiologic testing in the last year.  They do not  have excessive sun exposure. Discussed the need for sun protection: hats, long sleeves and use of sunscreen if there is significant sun exposure.   Diet: the importance of a healthy diet is discussed. They do have a healthy diet.  The benefits of regular aerobic exercise were discussed. She walks 4 times per week ,  20 minutes.   Depression screen: there are no signs or vegative symptoms of depression- irritability, change in appetite, anhedonia, sadness/tearfullness.  Cognitive assessment: the patient manages all their financial and personal affairs and is actively engaged. They could relate day,date,year and events; recalled 2/3 objects at 3 minutes; performed clock-face test normally.  The following portions of the patient's history were reviewed and updated as appropriate: allergies, current medications, past family history, past  medical history,  past surgical history, past social history  and problem list.  Visual acuity was not assessed per patient preference since she has regular follow up with her ophthalmologist. Hearing and body mass index were assessed and reviewed.   During the course of the visit the patient was educated and counseled about appropriate screening and preventive services including : fall prevention , diabetes screening, nutrition counseling, colorectal cancer screening, and recommended immunizations.    CC: The primary encounter diagnosis was Essential hypertension. Diagnoses of Hypothyroidism due to acquired atrophy of thyroid, Mixed hyperlipidemia, COVID-19 vaccine regimen to maintain immunity completed, Hemiplegia affecting dominant side, post-stroke ([place]), Edema due to hypoalbuminemia, Venous insufficiency of both lower extremities, Purpura ([place]), Encounter for preventive health examination, and History of CVA with residual deficit were also pertinent to this visit.  Reviewed her last visit with Oncology.   Plans to dc [facility] in March.   Osteoporosis . Patient does NOT WANT more testing and has declined Prolia  After hearing about the potential side effects    Insomnia: reviewed previous trial of mirtazipine weight  gain.  currently and using tylenol PM .  Has chronic dry mouth   Lower extremity Edema R > L,  Chronic.  Using furosemide every other day . Edema is better at night .  Reviewed causes.  Using compression socks almost daily .  Advised against using lasix daily  Vaccine booster discussed  Post stroke fatigue.  Still having left leg weakness worse by end of day  Wants to resume PT oupatient. Doing  a balance DVD at home. doesn't go out at night due to fear of falling   Depression:  Did not tolerate higher dose of zoloft  Intentional weight loss  Of 9 lbs since last visit    History Kiriana has a past medical history of Arthritis, Breast cancer ([place]) (2017), Breast cancer in  female [facility]) (11/18/2015), Cancer [facility]), Chronic kidney disease, Genetic screening (March 2017.), History of heart attack (06/12/2014), Hypertension, hypothyroidism, Hypothyroidism, Lupus ([place]), Menopause (32s), Myocardial infarction ([place]) (2013), Osteoporosis, Rosacea, Sjoegren syndrome, and Stress-induced cardiomyopathy (September of 2013).   She has a past surgical history that includes Kyphosis surgery (Feb 2008); Cholecystectomy; Tonsillectomy; Tubal ligation; Lumbar disc surgery; Shoulder arthroscopy (2004); Thyroidectomy; Spine surgery; Cardiac catheterization (05/2012); Dilation and curettage of uterus; Back surgery; Simple mastectomy with axillary sentinel node biopsy (Left, 11/18/2015); Sentinel node biopsy (Left, 11/18/2015); Colonoscopy; Breast biopsy (Left, 10/30/15); and Mastectomy (Left, 11/18/2015).   Her family history includes COPD in her father; Cancer in her father; Cancer (age of onset: 28) in her brother; Heart attack (age of onset: 16) in her brother; Heart disease in her brother and mother; Kidney disease in her mother and sister.She reports that she has never smoked. She has never used smokeless tobacco. She reports that she does not drink alcohol and does not use drugs.  Outpatient Medications Prior to Visit  Medication Sig Dispense Refill  . atorvastatin (LIPITOR) 20 MG tablet TAKE 1 TABLET BY MOUTH DAILY AT 6PM 90 tablet 1  . Calcium Carbonate-Vitamin D (CALCIUM + D) 600-200 MG-UNIT TABS Take 2 tablets by mouth daily.     . cholecalciferol (VITAMIN D) 1000 UNITS tablet Take 2,000 Units by mouth daily.     Marland Kitchen CRANBERRY PO Take 1 capsule by mouth 2 (two) times daily.    . furosemide (LASIX) 20 MG tablet 1 TABLET EVERY OTHER DAY AS NEEDED 45 tablet 2  . letrozole (FEMARA) 2.5 MG tablet Take 1 tablet (2.5 mg total) by mouth daily. 90 tablet 3  . levothyroxine (SYNTHROID) 75 MCG tablet TAKE 1 TABLET BY MOUTH (75MCG TOTAL) DAILY 90 tablet 2  . losartan (COZAAR) 25 MG tablet TAKE 1  TABLET BY MOUTH EVERY DAY 90 tablet 1  . metolazone (ZAROXOLYN) 2.5 MG tablet Take 1 tablet (2.5 mg total) by mouth daily. 30 minutes before furosemide dose 30 tablet 1  . metoprolol succinate (TOPROL-XL) 25 MG 24 hr tablet TAKE 1 TABLET BY MOUTH EVERY DAY 90 tablet 3  . nitroGLYCERIN (NITROSTAT) 0.4 MG SL tablet Place 1 tablet (0.4 mg total) under the tongue every 5 (five) minutes as needed for chest pain. Maximum 3 doses 50 tablet 3  . omeprazole (PRILOSEC) 20 MG capsule TAKE 1 CAPSULE BY MOUTH TWICE A DAY BEFORE A MEAL 180 capsule 0  . polyethylene glycol (MIRALAX / GLYCOLAX) packet Take 17 g by mouth daily.    . Probiotic Product (PROBIOTIC PO) Take 1 tablet by mouth daily.    . sertraline (ZOLOFT) 25 MG tablet TAKE 1 TABLET BY MOUTH EVERY DAY 90 tablet 1  . sodium fluoride (DENTA 5000 PLUS) 1.1 % CREA dental cream Take 1 application by mouth daily.     . tizanidine (ZANAFLEX) 2 MG capsule Take 1 capsule (2 mg total) by mouth 3 (three) times daily. 90 capsule 0  . triamcinolone ointment (KENALOG) 0.1 % Apply 1 application topically daily.  0  . traMADol (ULTRAM) 50 MG tablet Take 1 tablet (50 mg total) by mouth every 8 (eight) hours as needed.  60 tablet 2  . ondansetron (ZOFRAN ODT) 4 MG disintegrating tablet Take 1 tablet (4 mg total) by mouth every 8 (eight) hours as needed for nausea or vomiting. (Patient not taking: Reported on 10/09/2019) 20 tablet 0  . BESIVANCE 0.6 % SUSP Place 1 drop into the left eye 3 (three) times daily. (Patient not taking: Reported on 07/23/2020)    . betamethasone dipropionate (DIPROLENE) 0.05 % ointment Apply topically 2 (two) times daily. (Patient not taking: Reported on 07/23/2020)    . clobetasol ointment (TEMOVATE) 0.05 % Apply topically. (Patient not taking: Reported on 07/23/2020)    . DUREZOL 0.05 % EMUL Place 1 drop into the right eye 3 (three) times daily. (Patient not taking: Reported on 07/23/2020)    . metroNIDAZOLE (METROCREAM) 0.75 % cream Apply 1  application topically 2 (two) times daily. (Patient not taking: Reported on 04/09/2020)    . PROLENSA 0.07 % SOLN SMARTSIG:1 Drop(s) Left Eye Every Evening (Patient not taking: Reported on 07/23/2020)     No facility-administered medications prior to visit.    Review of Systems   Patient denies headache, fevers, malaise, unintentional weight loss, skin rash, eye pain, sinus congestion and sinus pain, sore throat, dysphagia,  hemoptysis , cough, dyspnea, wheezing, chest pain, palpitations, orthopnea, edema, abdominal pain, nausea, melena, diarrhea, constipation, flank pain, dysuria, hematuria, urinary  Frequency, nocturia, numbness, tingling, seizures,  Focal weakness, Loss of consciousness,  Tremor, insomnia, depression, anxiety, and suicidal ideation.      Objective:  BP (!) 146/78 (BP Location: Left Arm, Patient Position: Sitting, Cuff Size: Normal)   Pulse 70   Temp 98 F (36.7 C) (Oral)   Resp 15   Ht 5' 3.75" (1.619 m)   Wt 142 lb 6.4 oz (64.6 kg)   SpO2 98%   BMI 24.63 kg/m   Physical Exam  General appearance: alert, cooperative and appears stated age Ears: normal TM's and external ear canals both ears Throat: lips, mucosa, and tongue normal; teeth and gums normal Neck: no adenopathy, no carotid bruit, supple, symmetrical, trachea midline and thyroid not enlarged, symmetric, no tenderness/mass/nodules Back: symmetric, no curvature. ROM normal. No CVA tenderness. Lungs: clear to auscultation bilaterally Heart: regular rate and rhythm, S1, S2 normal, no murmur, click, rub or gallop Abdomen: soft, non-tender; bowel sounds normal; no masses,  no organomegaly Pulses: 2+ and symmetric Skin: diffuse purpura covering arms and legs  Lymph nodes: Cervical, supraclavicular, and axillary nodes normal.   Assessment & Plan:   Problem List Items Addressed This Visit      Unprioritized   Edema due to hypoalbuminemia    Encouraged her to increase her protein intake,  And not to use  furosemide more than once daily       RESOLVED: Encounter for preventive health examination    age appropriate education and counseling updated, referrals for preventative services and immunizations addressed, dietary and smoking counseling addressed, most recent labs reviewed.  I have personally reviewed and have noted:  1) the patient's medical and social history 2) The pt's use of alcohol, tobacco, and illicit drugs 3) The patient's current medications and supplements 4) Functional ability including ADL's, fall risk, home safety risk, hearing and visual impairment 5) Diet and physical activities 6) Evidence for depression or mood disorder 7) The patient's height, weight, and BMI have been recorded in the chart  I have made referrals, and provided counseling and education based on review of the above      Essential hypertension - Primary  Patient is taking her medications as prescribed and notes no adverse effects.  Home BP readings have been done about once per week and are  generally < 130/80 .  She is avoiding added salt in her diet and walking regularly about 3 times per week for exercise  .      Relevant Orders   Comprehensive metabolic panel   Hemiplegia affecting dominant side, post-stroke ([place])    She has developed weakness in the affected leg and wants to resume PT in [place] .       History of CVA with residual deficit    Right thalamic ,  Nonhemorrhagic, presenning with left sided weakness and hypertensive crisis. Now taking statin asa and plavix       Hyperlipidemia   Relevant Orders   Lipid panel   Hypothyroidism due to acquired atrophy of thyroid   Relevant Orders   TSH   Purpura ([place])    recurrent finding,  In the setting of drop in platelets..repeat level today is stable  Lab Results  Component Value Date   WBC 4.0 04/09/2020   HGB 10.6 (L) 04/09/2020   HCT 30.7 (L) 04/09/2020   MCV 97.8 04/09/2020   PLT 147 (L) 04/09/2020         Venous  insufficiency of both lower extremities    Advised to continue use of compression knee highs,  Leg elevation and every other day lasix       Other Visit Diagnoses    COVID-19 vaccine regimen to maintain immunity completed       Relevant Orders   SARS-CoV-2 Semi-Quantitative Total Antibody, Spike      I have discontinued Zela R. Luck "Emma"'s traMADol, metroNIDAZOLE, Durezol, clobetasol ointment, Prolensa, betamethasone dipropionate, and Besivance. I am also having her start on traZODone. Additionally, I am having her maintain her cholecalciferol, Calcium Carbonate-Vitamin D, polyethylene glycol, tizanidine, ondansetron, triamcinolone ointment, Probiotic Product (PROBIOTIC PO), nitroGLYCERIN, CRANBERRY PO, sodium fluoride, metolazone, letrozole, atorvastatin, sertraline, furosemide, omeprazole, losartan, metoprolol succinate, and levothyroxine.  Meds ordered this encounter  Medications  . traZODone (DESYREL) 50 MG tablet    Sig: Take 1 tablet (50 mg total) by mouth at bedtime.    Dispense:  90 tablet    Refill:  1    Medications Discontinued During This Encounter  Medication Reason  . BESIVANCE 0.6 % SUSP Completed Course  . betamethasone dipropionate (DIPROLENE) 0.05 % ointment   . clobetasol ointment (TEMOVATE) 0.05 %   . DUREZOL 0.05 % EMUL Completed Course  . metroNIDAZOLE (METROCREAM) 0.75 % cream   . PROLENSA 0.07 % SOLN   . traMADol (ULTRAM) 50 MG tablet     Follow-up: Return in about 4 weeks (around 08/20/2020).   [name], MD

## 2020-07-24 LAB — COMPREHENSIVE METABOLIC PANEL
ALT: 4 U/L (ref 0–35)
AST: 16 U/L (ref 0–37)
Albumin: 3.8 g/dL (ref 3.5–5.2)
Alkaline Phosphatase: 78 U/L (ref 39–117)
BUN: 22 mg/dL (ref 6–23)
CO2: 30 mEq/L (ref 19–32)
Calcium: 9.1 mg/dL (ref 8.4–10.5)
Chloride: 94 mEq/L — ABNORMAL LOW (ref 96–112)
Creatinine, Ser: 0.72 mg/dL (ref 0.40–1.20)
GFR: 77.25 mL/min (ref >60.00)
Glucose, Bld: 87 mg/dL (ref 70–99)
Potassium: 4.5 mEq/L (ref 3.5–5.1)
Sodium: 130 mEq/L — ABNORMAL LOW (ref 135–145)
Total Bilirubin: 0.7 mg/dL (ref 0.2–1.2)
Total Protein: 6.3 g/dL (ref 6.0–8.3)

## 2020-07-24 LAB — LIPID PANEL
Cholesterol: 92 mg/dL (ref 0–200)
HDL: 31.4 mg/dL — ABNORMAL LOW (ref >39.00)
LDL Cholesterol: 39 mg/dL (ref 0–99)
NonHDL: 60.82
Total CHOL/HDL Ratio: 3
Triglycerides: 110 mg/dL (ref 0.0–149.0)
VLDL: 22 mg/dL (ref 0.0–40.0)

## 2020-07-24 LAB — SARS-COV-2 SEMI-QUANTITATIVE TOTAL ANTIBODY, SPIKE
SARS-CoV-2 Semi-Quant Total Ab: 55.4 U/mL (ref <0.8)
SARS-CoV-2 Spike Ab Interp: POSITIVE

## 2020-07-24 LAB — TSH: TSH: 0.72 u[IU]/mL (ref 0.35–4.50)

## 2020-07-26 DIAGNOSIS — T691XXD Chilblains, subsequent encounter: Secondary | ICD-10-CM | POA: Diagnosis not present

## 2020-07-26 DIAGNOSIS — D692 Other nonthrombocytopenic purpura: Secondary | ICD-10-CM | POA: Diagnosis not present

## 2020-07-26 DIAGNOSIS — L710 Perioral dermatitis: Secondary | ICD-10-CM | POA: Diagnosis not present

## 2020-07-26 DIAGNOSIS — L932 Other local lupus erythematosus: Secondary | ICD-10-CM | POA: Diagnosis not present

## 2020-07-26 DIAGNOSIS — Z79899 Other long term (current) drug therapy: Secondary | ICD-10-CM | POA: Diagnosis not present

## 2020-07-29 NOTE — Progress Notes (Signed)
Your sodium level is low but stable.  No action is needed Your covid antibody test is low.  I recommend getting the booster Your other labs are normal.  Continue your current medications and plan to repeat in 6 months.   Hoping you have a wonderful Thanksgiving   Regards,   Deborra Medina, MD

## 2020-08-07 ENCOUNTER — Other Ambulatory Visit: Payer: Self-pay | Admitting: Hematology and Oncology

## 2020-08-07 DIAGNOSIS — C50912 Malignant neoplasm of unspecified site of left female breast: Secondary | ICD-10-CM

## 2020-08-19 ENCOUNTER — Telehealth: Payer: Self-pay

## 2020-08-19 NOTE — Telephone Encounter (Signed)
Are you okay with switching her appt at 4:30 tomorrow from virtual to in office?

## 2020-08-19 NOTE — Telephone Encounter (Signed)
Pt states that she got covid booster on 12/7. She states that she got a little rash around injection site. She states that her arm is not beet red down to elbow. She states that she does not want to go to ER or [place] urgent care close to where she lives. She has a virtual appt with Dr Derrel Nip on 12/14 at 4:30 but wants it to be in office. Please advise

## 2020-08-19 NOTE — Telephone Encounter (Signed)
Yes that's fine to switch to in house

## 2020-08-19 NOTE — Telephone Encounter (Signed)
Pt is aware that appt can be in the office.

## 2020-08-20 ENCOUNTER — Other Ambulatory Visit: Payer: Self-pay

## 2020-08-20 ENCOUNTER — Ambulatory Visit (INDEPENDENT_AMBULATORY_CARE_PROVIDER_SITE_OTHER): Payer: PPO | Admitting: Internal Medicine

## 2020-08-20 ENCOUNTER — Encounter: Payer: Self-pay | Admitting: Internal Medicine

## 2020-08-20 DIAGNOSIS — T50Z95A Adverse effect of other vaccines and biological substances, initial encounter: Secondary | ICD-10-CM

## 2020-08-20 DIAGNOSIS — L930 Discoid lupus erythematosus: Secondary | ICD-10-CM | POA: Diagnosis not present

## 2020-08-20 DIAGNOSIS — I872 Venous insufficiency (chronic) (peripheral): Secondary | ICD-10-CM | POA: Diagnosis not present

## 2020-08-20 NOTE — Patient Instructions (Signed)
I am recommending that you postpone your blood draw until Friday or morning,  Because you had an intense inflammatory response involving that arm and I do not want to puncture your vein until it has improved.  SEND A PICTURE OF YOUR ARM AND YOUR NECK TO DR CULTON    Continue the furosemide not more than every other day.    Continue to use compression stockings daily.  PUT THEM ON FIRST THING EVERY MORNING AND TAKE THEM OFF AT NIGHT

## 2020-08-20 NOTE — Progress Notes (Signed)
Subjective:  Patient ID: Kim Santiago, female    DOB: 01-01-1935  Age: 85 y.o. MRN: [id number]  CC: Diagnoses of Vaccine reaction, initial encounter, Venous insufficiency of both lower extremities, and Discoid lupus were pertinent to this visit.  HPI Kim Santiago presents for diffuse rash involving the left upper arm   This visit occurred during the SARS-CoV-2 public health emergency.  Safety protocols were in place, including screening questions prior to the visit, additional usage of staff PPE, and extensive cleaning of exam room while observing appropriate contact time as indicated for disinfecting solutions.   Intense erythematous reaction to right upper arm after receiving COVID booster on Dec 7  Also has developed  a wax paper y texture to her neck skin for the past month   preceeded the initiation of mtx therapy, and has been applying Curel moisturizer  Less red than in the beginning .  Taking 25 mg trazodone for insomnia since last visit.  Averaging 6 to 8 hours ,  Not groggy in the morning .Marland Kitchen  Trouble going back  To sleep.          Outpatient Medications Prior to Visit  Medication Sig Dispense Refill   atorvastatin (LIPITOR) 20 MG tablet TAKE 1 TABLET BY MOUTH DAILY AT 6PM 90 tablet 1   Calcium Carbonate-Vitamin D 600-200 MG-UNIT TABS Take 2 tablets by mouth daily.     cholecalciferol (VITAMIN D) 1000 UNITS tablet Take 2,000 Units by mouth daily.     CRANBERRY PO Take 1 capsule by mouth 2 (two) times daily.     folic acid (FOLVITE) 1 MG tablet Take by mouth.     furosemide (LASIX) 20 MG tablet 1 TABLET EVERY OTHER DAY AS NEEDED 45 tablet 2   letrozole (FEMARA) 2.5 MG tablet TAKE 1 TABLET BY MOUTH EVERY DAY 90 tablet 3   levothyroxine (SYNTHROID) 75 MCG tablet TAKE 1 TABLET BY MOUTH (75MCG TOTAL) DAILY 90 tablet 2   losartan (COZAAR) 25 MG tablet TAKE 1 TABLET BY MOUTH EVERY DAY 90 tablet 1   methotrexate (RHEUMATREX) 2.5 MG tablet Take by mouth.      metolazone (ZAROXOLYN) 2.5 MG tablet Take 1 tablet (2.5 mg total) by mouth daily. 30 minutes before furosemide dose 30 tablet 1   metoprolol succinate (TOPROL-XL) 25 MG 24 hr tablet TAKE 1 TABLET BY MOUTH EVERY DAY 90 tablet 3   nitroGLYCERIN (NITROSTAT) 0.4 MG SL tablet Place 1 tablet (0.4 mg total) under the tongue every 5 (five) minutes as needed for chest pain. Maximum 3 doses 50 tablet 3   omeprazole (PRILOSEC) 20 MG capsule TAKE 1 CAPSULE BY MOUTH TWICE A DAY BEFORE A MEAL 180 capsule 0   ondansetron (ZOFRAN ODT) 4 MG disintegrating tablet Take 1 tablet (4 mg total) by mouth every 8 (eight) hours as needed for nausea or vomiting. 20 tablet 0   polyethylene glycol (MIRALAX / GLYCOLAX) packet Take 17 g by mouth daily.     Probiotic Product (PROBIOTIC PO) Take 1 tablet by mouth daily.     sertraline (ZOLOFT) 25 MG tablet TAKE 1 TABLET BY MOUTH EVERY DAY 90 tablet 1   sodium fluoride (PREVIDENT 5000 PLUS) 1.1 % CREA dental cream Take 1 application by mouth daily.      tizanidine (ZANAFLEX) 2 MG capsule Take 1 capsule (2 mg total) by mouth 3 (three) times daily. 90 capsule 0   traZODone (DESYREL) 50 MG tablet Take 1 tablet (50 mg total) by mouth at  bedtime. 90 tablet 1   triamcinolone ointment (KENALOG) 0.1 % Apply 1 application topically daily.  0   No facility-administered medications prior to visit.    Review of Systems;  Patient denies headache, fevers, malaise, unintentional weight loss, skin rash, eye pain, sinus congestion and sinus pain, sore throat, dysphagia,  hemoptysis , cough, dyspnea, wheezing, chest pain, palpitations, orthopnea, edema, abdominal pain, nausea, melena, diarrhea, constipation, flank pain, dysuria, hematuria, urinary  Frequency, nocturia, numbness, tingling, seizures,  Focal weakness, Loss of consciousness,  Tremor, insomnia, depression, anxiety, and suicidal ideation.      Objective:  Pulse 96    Temp 98.7 F (37.1 C)    Ht 5' 3.74" (1.619 m)    Wt  145 lb 6.4 oz (66 kg)    SpO2 97%    BMI 25.16 kg/m   BP Readings from Last 3 Encounters:  07/23/20 (!) 146/78  04/09/20 137/62  01/16/20 (!) 160/84    Wt Readings from Last 3 Encounters:  08/20/20 145 lb 6.4 oz (66 kg)  07/23/20 142 lb 6.4 oz (64.6 kg)  04/09/20 147 lb 7.8 oz (66.9 kg)    General appearance: alert, cooperative and appears stated age Ears: normal TM's and external ear canals both ears Throat: lips, mucosa, and tongue normal; teeth and gums normal Neck: no adenopathy, no carotid bruit, supple, symmetrical, trachea midline and thyroid not enlarged, symmetric, no tenderness/mass/nodules Back: symmetric, no curvature. ROM normal. No CVA tenderness. Lungs: clear to auscultation bilaterally Heart: regular rate and rhythm, S1, S2 normal, no murmur, click, rub or gallop Abdomen: soft, non-tender; bowel sounds normal; no masses,  no organomegaly Pulses: 2+ and symmetric Skin: Skin color, texture, turgor normal. No rashes or lesions Lymph nodes: Cervical, supraclavicular, and axillary nodes normal.  Lab Results  Component Value Date   HGBA1C 4.9 11/08/2018    Lab Results  Component Value Date   CREATININE 0.72 07/23/2020   CREATININE 0.63 04/09/2020   CREATININE 1.01 (H) 09/05/2019    Lab Results  Component Value Date   WBC 4.0 04/09/2020   HGB 10.6 (L) 04/09/2020   HCT 30.7 (L) 04/09/2020   PLT 147 (L) 04/09/2020   GLUCOSE 87 07/23/2020   CHOL 92 07/23/2020   TRIG 110.0 07/23/2020   HDL 31.40 (L) 07/23/2020   LDLDIRECT 117.6 12/27/2012   LDLCALC 39 07/23/2020   ALT 4 07/23/2020   AST 16 07/23/2020   NA 130 (L) 07/23/2020   K 4.5 07/23/2020   CL 94 (L) 07/23/2020   CREATININE 0.72 07/23/2020   BUN 22 07/23/2020   CO2 30 07/23/2020   TSH 0.72 07/23/2020   INR 1.0 10/10/2019   HGBA1C 4.9 11/08/2018    DG Bone Density  Result Date: 04/23/2020 EXAM: DUAL X-RAY ABSORPTIOMETRY (DXA) FOR BONE MINERAL DENSITY IMPRESSION: crr Your patient Kim Santiago  completed a BMD test on 04/23/2020 using the [employer] (analysis version: 14.10) manufactured by [employer]. The following summarizes the results of our evaluation. PATIENT BIOGRAPHICAL: Name: Kim Santiago, Kim Santiago Patient ID: [id number] Birth Date: October 18, 1934 Height: 63.0 in. Gender: Female Exam Date: 04/23/2020 Weight: 138.8 lbs. Indications: Advanced Age, Caucasian, Early Menopause, Height Loss, high risk meds, hx breast ca, hx thyroid ca, lupus, osteopenia, POSTmenopausal Fractures: Treatments: calcium /vit d, letrozole, levothyroxine ASSESSMENT: The BMD measured at Femur Neck Right is 0.811 g/cm2 with a T-score of -1.6. This patient is considered osteopenic according to [facility]) criteria. L-2 was excluded due to degenerative changes. The scan  quality is limited by exclusion of L-2. Site Region Measured Measured WHO Young Adult BMD Date       Age      Classification T-score AP Spine L1-L4 (L2) 6/23/[age] 83.3 Normal -0.4 1.124 g/cm2 AP Spine L1-L4 (L2) 12/07/2017 80.9 Normal -0.3 1.134 g/cm2 AP Spine L1-L4 (L2) 12/05/2015 78.9 Normal -0.3 1.134 g/cm2 DualFemur Neck Right 04/23/2020 83.3 Osteopenia -1.6 0.811 g/cm2 DualFemur Neck Right 12/07/2017 80.9 Osteopenia -1.6 0.813 g/cm2 DualFemur Neck Right 12/05/2015 78.9 Osteopenia -1.5 0.829 g/cm2 DualFemur Total Mean 04/23/2020 83.3 Osteopenia -1.2 0.857 g/cm2 DualFemur Total Mean 12/07/2017 80.9 Osteopenia -1.1 0.872 g/cm2 DualFemur Total Mean 12/05/2015 78.9 Osteopenia -1.1 0.867 g/cm2 World Health Organization [facility]) criteria for post-menopausal, Caucasian Women: Normal:       T-score at or above -1 SD Osteopenia:   T-score between -1 and -2.5 SD Osteoporosis: T-score at or below -2.5 SD RECOMMENDATIONS: 1. All patients should optimize calcium and vitamin D intake. 2. Consider FDA-approved medical therapies in postmenopausal women and men aged 39 years and older, based on the following: a. A hip or vertebral (clinical or  morphometric) fracture b. T-score < -2.5 at the femoral neck or spine after appropriate evaluation to exclude secondary causes c. Low bone mass (T-score between -1.0 and -2.5 at the femoral neck or spine) and a 10-year probability of a hip fracture > 3% or a 10-year probability of a major osteoporosis-related fracture > 20% based on the [place]-adapted WHO algorithm d. Clinician judgment and/or patient preferences may indicate treatment for people with 10-year fracture probabilities above or below these levels FOLLOW-UP: People with diagnosed cases of osteoporosis or at high risk for fracture should have regular bone mineral density tests. For patients eligible for Medicare, routine testing is allowed once every 2 years. The testing frequency can be increased to one year for patients who have rapidly progressing disease, those who are receiving or discontinuing medical therapy to restore bone mass, or have additional risk factors. I have reviewed this report, and agree with the above findings. [facility] Radiology crr Your patient Kim Santiago completed a FRAX assessment on 04/23/2020 using the [employer] (analysis version: 14.10) manufactured by [employer]. The following summarizes the results of our evaluation. PATIENT BIOGRAPHICAL: Name: Kim Santiago, Kim Santiago Patient ID: [id number] Birth Date: November 04, 1934 Height:    63.0 in. Gender:     [age]    [age]       Weight:    138.8 lbs. Ethnicity:  White                            Exam Date: 04/23/2020 FRAX* RESULTS:  (version: 3.5) 10-year Probability of Fracture1 Major Osteoporotic Fracture2 Hip Fracture 14.2% 4.0% Population: [place] (Caucasian) Risk Factors: None Based on Femur (Right) Neck BMD 1 -The 10-year probability of fracture may be lower than reported if the patient has received treatment. 2 -Major Osteoporotic Fracture: Clinical Spine, Forearm, Hip or Shoulder *FRAX is a Materials engineer of the [employer] for Metabolic Bone Disease, a [place] (WHO) [facility]. ASSESSMENT: The probability of a major osteoporotic fracture is 14.2% within the next ten years. The probability of a hip fracture is 4.0% within the next ten years. Electronically Signed   By: Lowella Grip III M.D.   On: 04/23/2020 11:49    Assessment & Plan:   Problem List Items Addressed This  Visit      Unprioritized   Discoid lupus    Now managed with MTX and folic acid.  No changes today ; delaying today's labs for a week givne hr current inflammatory state       Venous insufficiency of both lower extremities    Without evidenc of systoldi dysfunction.  Limit lasix to every other day       Vaccine reaction, initial encounter    She has had an intense inflammatory reaction to the COVID booster which she received on Dec 7 .  The arm is warm and red but per patient it is steadily improving.         A total of 45 minutes was spent with patient more than half of which was spent in counseling patient on the above mentioned issues , reviewing and explaining recent labs and imaging studies done, and coordination of care.  I am having Aanshi R. Bradham "[name]" maintain her cholecalciferol, Calcium Carbonate-Vitamin D, polyethylene glycol, tizanidine, ondansetron, triamcinolone ointment, Probiotic Product (PROBIOTIC PO), nitroGLYCERIN, CRANBERRY PO, sodium fluoride, metolazone, atorvastatin, sertraline, furosemide, omeprazole, losartan, metoprolol succinate, levothyroxine, traZODone, letrozole, folic acid, and methotrexate.  No orders of the defined types were placed in this encounter.   There are no discontinued medications.  Follow-up: No follow-ups on file.   [name], MD

## 2020-08-20 NOTE — Assessment & Plan Note (Signed)
Now managed with MTX and folic acid.  No changes today ; delaying today's labs for a week givne hr current inflammatory state

## 2020-08-20 NOTE — Assessment & Plan Note (Signed)
Without evidenc of systoldi dysfunction.  Limit lasix to every other day

## 2020-08-20 NOTE — Assessment & Plan Note (Signed)
She has had an intense inflammatory reaction to the COVID booster which she received on Dec 7 .  The arm is warm and red but per patient it is steadily improving.

## 2020-08-23 DIAGNOSIS — N390 Urinary tract infection, site not specified: Secondary | ICD-10-CM | POA: Diagnosis not present

## 2020-08-26 ENCOUNTER — Telehealth: Payer: Self-pay | Admitting: *Deleted

## 2020-08-26 DIAGNOSIS — L930 Discoid lupus erythematosus: Secondary | ICD-10-CM

## 2020-08-26 DIAGNOSIS — Z79899 Other long term (current) drug therapy: Secondary | ICD-10-CM

## 2020-08-26 DIAGNOSIS — D702 Other drug-induced agranulocytosis: Secondary | ICD-10-CM

## 2020-08-26 NOTE — Telephone Encounter (Signed)
Please place future orders for lab appt.  

## 2020-08-27 ENCOUNTER — Other Ambulatory Visit (INDEPENDENT_AMBULATORY_CARE_PROVIDER_SITE_OTHER): Payer: PPO

## 2020-08-27 ENCOUNTER — Other Ambulatory Visit: Payer: Self-pay

## 2020-08-27 DIAGNOSIS — Z79899 Other long term (current) drug therapy: Secondary | ICD-10-CM

## 2020-08-27 DIAGNOSIS — D702 Other drug-induced agranulocytosis: Secondary | ICD-10-CM

## 2020-08-27 DIAGNOSIS — L930 Discoid lupus erythematosus: Secondary | ICD-10-CM

## 2020-08-27 LAB — COMPREHENSIVE METABOLIC PANEL
ALT: 7 U/L (ref 0–35)
AST: 18 U/L (ref 0–37)
Albumin: 3.7 g/dL (ref 3.5–5.2)
Alkaline Phosphatase: 78 U/L (ref 39–117)
BUN: 18 mg/dL (ref 6–23)
CO2: 30 mEq/L (ref 19–32)
Calcium: 8.5 mg/dL (ref 8.4–10.5)
Chloride: 100 mEq/L (ref 96–112)
Creatinine, Ser: 0.77 mg/dL (ref 0.40–1.20)
GFR: 71.22 mL/min (ref >60.00)
Glucose, Bld: 79 mg/dL (ref 70–99)
Potassium: 4.4 mEq/L (ref 3.5–5.1)
Sodium: 133 mEq/L — ABNORMAL LOW (ref 135–145)
Total Bilirubin: 0.6 mg/dL (ref 0.2–1.2)
Total Protein: 6.1 g/dL (ref 6.0–8.3)

## 2020-08-27 LAB — CBC WITH DIFFERENTIAL/PLATELET
Basophils Absolute: 0.1 10*3/uL (ref 0.0–0.1)
Basophils Relative: 2.4 % (ref 0.0–3.0)
Eosinophils Absolute: 0.1 10*3/uL (ref 0.0–0.7)
Eosinophils Relative: 3.9 % (ref 0.0–5.0)
HCT: 28.9 % — ABNORMAL LOW (ref 36.0–46.0)
Hemoglobin: 10.1 g/dL — ABNORMAL LOW (ref 12.0–15.0)
Lymphocytes Relative: 27.8 % (ref 12.0–46.0)
Lymphs Abs: 1 10*3/uL (ref 0.7–4.0)
MCHC: 34.9 g/dL (ref 30.0–36.0)
MCV: 99.2 fl (ref 78.0–100.0)
Monocytes Absolute: 0.4 10*3/uL (ref 0.1–1.0)
Monocytes Relative: 10.2 % (ref 3.0–12.0)
Neutro Abs: 1.9 10*3/uL (ref 1.4–7.7)
Neutrophils Relative %: 55.7 % (ref 43.0–77.0)
Platelets: 161 10*3/uL (ref 150.0–400.0)
RBC: 2.91 Mil/uL — ABNORMAL LOW (ref 3.87–5.11)
RDW: 17.5 % — ABNORMAL HIGH (ref 11.5–15.5)
WBC: 3.4 10*3/uL — ABNORMAL LOW (ref 4.0–10.5)

## 2020-08-27 LAB — SEDIMENTATION RATE: Sed Rate: 17 mm/hr (ref 0–30)

## 2020-08-27 NOTE — Addendum Note (Signed)
Addended by: [name] on: 08/27/2020 07:50 AM   Modules accepted: Orders

## 2020-09-05 ENCOUNTER — Other Ambulatory Visit: Payer: Self-pay | Admitting: Internal Medicine

## 2020-10-07 ENCOUNTER — Other Ambulatory Visit: Payer: Self-pay

## 2020-10-07 DIAGNOSIS — D649 Anemia, unspecified: Secondary | ICD-10-CM

## 2020-10-07 DIAGNOSIS — D72819 Decreased white blood cell count, unspecified: Secondary | ICD-10-CM

## 2020-10-07 DIAGNOSIS — C50912 Malignant neoplasm of unspecified site of left female breast: Secondary | ICD-10-CM

## 2020-10-09 NOTE — Progress Notes (Signed)
[facility]  [address], [address] [place], [place] [address] Phone: [phone]  Fax: [phone]   Clinic Day:  10/10/2020  Referring physician: [name], MD  Chief Complaint: Kim Santiago is a 86 y.o. female with stage IIB left breast cancer on letrozole who is seen for 6 month assessment.  HPI: The patient was last seen in the medical oncology clinic on 04/09/2020. At that time, she felt pretty good.  She noted bruising on her arms.  Exam felt fibrocystic changes in the right breast.  She appeared to have senile ecchymosis.  Hematocrit was 30.7, hemoglobin 10.6, MCV 97.8, platelets 147,000, WBC 4,000. Platelet function assay was normal. Sodium was 129. Calcium was 8.6 and albumin 3.7. Ferritin was 264. CA27.29 was 25.0. She continued Femara, calcium, and vitamin D. She was given information about BCI testing.  Bone density on 04/23/2020 revealed osteopenia with a T score of -1.6 at the right femur neck.  During the interim, she has been "good." She has purpura on her arms that comes and goes. Her mouth is still dry. Her balance has improved and she has not had any falls. Her joint pain has resolved.  She has a dry rash on her anterior neck. The skin peeled away and the rash came back again. She tried several medications. She sees Dr. Veronia Beets in dermatology. The patient's daughter recently sent Dr. Veronia Beets a photo of the rash.  The patient takes Synthroid for hypothyroidism. She is having another lupus outbreak. She recently lost a tooth and needs a bridge. She denies shortness of breath, chest pain, and palpitations.  She performs weekly breast self exams. She will decrease the frequency of the exams to once per month.  The patient would like to postpone a chest CT.   Past Medical History:  Diagnosis Date  . Arthritis   . Breast cancer ([facility]) 2017   left mastectomy done 11/2015  . Breast cancer in female [facility]) 11/18/2015   Left: 3.9 cm tumor,  T2, 1/2 sentinel nodes positive for macro metastatic disease, N1, 3 negative nodes in the axillary tail, ER+,PR+, Her 2 neu, low Mammoprint score  . Cancer [facility])    thyroid takes levothyroxine  . Chronic kidney disease    UTI  . Genetic screening March 2017.   Mammoprint of left breast cancer: Low risk for recurrence.   Marland Kitchen History of heart attack 06/12/2014  . Hypertension   . hypothyroidism    secondary to thyroidectomy for thyroid ca  . Hypothyroidism   . Lupus ([facility])    subcutaneous  . Menopause 40s   natural, hot flashes and mood lability now gone, off prempro 7 months  . Myocardial infarction ([place]) 2013  . Osteoporosis    Osteopenia  . Rosacea   . Sjoegren syndrome   . Stress-induced cardiomyopathy September of 2013   EF 35%. Peak troponin was 1.8.    Past Surgical History:  Procedure Laterality Date  . BACK SURGERY    . BREAST BIOPSY Left 10/30/15   positive, done in Dr. Dwyane Luo office  . CARDIAC CATHETERIZATION  05/2012   [facility]. No significant CAD. Ejection fraction of 35% due to stress-induced cardiomyopathy.  . CHOLECYSTECTOMY    . COLONOSCOPY    . DILATION AND CURETTAGE OF UTERUS    . KYPHOSIS SURGERY  Feb 2008   L1, Dr. Mauri Pole  . LUMBAR DISC SURGERY     L4-L5  . MASTECTOMY Left 11/18/2015   positive  . SENTINEL NODE  BIOPSY Left 11/18/2015   Procedure: SENTINEL NODE BIOPSY;  Surgeon: Robert Bellow, MD;  Location: [facility] ORS;  Service: General;  Laterality: Left;  . SHOULDER ARTHROSCOPY  2004   Left, Dr. Jefm Bryant  . SIMPLE MASTECTOMY WITH AXILLARY SENTINEL NODE BIOPSY Left 11/18/2015   Procedure: SIMPLE MASTECTOMY;  Surgeon: Robert Bellow, MD;  Location: [facility] ORS;  Service: General;  Laterality: Left;  . SPINE SURGERY     L4-5 diskectomy  . THYROIDECTOMY     Thyroid Cancer  . TONSILLECTOMY    . TUBAL LIGATION      Family History  Problem Relation Age of Onset  . COPD Father   . Cancer Father        esophageal  . Kidney disease Mother   . Heart  disease Mother   . Kidney disease Sister   . Cancer Brother 71       colon cancer (both brothers)  . Heart attack Brother 15  . Heart disease Brother   . Breast cancer Neg Hx     Social History:  reports that she has never smoked. She has never used smokeless tobacco. She reports that she does not drink alcohol and does not use drugs. She is from [place], [place]. She was married for 76 years. She has an adopted daughter and 2 grandchildren.Her brother died of a MI following a cholecystectomy in 12/2015. The patient is living at her daughter's house since her CVA in 11/2018.  The patient is alone today  Allergies:  Allergies  Allergen Reactions  . Azathioprine Nausea And Vomiting    Severe vomiting  . Hydroxychloroquine Hives and Nausea And Vomiting  . Mycophenolate Mofetil Nausea Only  . Amoxicillin Rash    Has patient had a PCN reaction causing immediate rash, facial/tongue/throat swelling, SOB or lightheadedness with hypotension: Unknown Has patient had a PCN reaction causing severe rash involving mucus membranes or skin necrosis: Unknown Has patient had a PCN reaction that required hospitalization: Unknown Has patient had a PCN reaction occurring within the last 10 years: Unknown If all of the above answers are "NO", then may proceed with Cephalosporin use. Tolerates ceftriaxone and Ancef  . Codeine Nausea And Vomiting  . Naprosyn [Naproxen] Swelling  . Orudis [Ketoprofen] Hives  . Sulfa Antibiotics Rash  . Sulfathiazole Rash    Current Medications: Current Outpatient Medications  Medication Sig Dispense Refill  . atorvastatin (LIPITOR) 20 MG tablet TAKE 1 TABLET BY MOUTH DAILY AT 6PM 90 tablet 1  . Calcium Carbonate-Vitamin D 600-200 MG-UNIT TABS Take 2 tablets by mouth daily.    . cholecalciferol (VITAMIN D) 1000 UNITS tablet Take 2,000 Units by mouth daily.    Marland Kitchen CRANBERRY PO Take 1 capsule by mouth 2 (two) times daily.    . folic acid (FOLVITE) 1 MG tablet Take by  mouth.    . furosemide (LASIX) 20 MG tablet 1 TABLET EVERY OTHER DAY AS NEEDED 45 tablet 2  . letrozole (FEMARA) 2.5 MG tablet TAKE 1 TABLET BY MOUTH EVERY DAY 90 tablet 3  . levothyroxine (SYNTHROID) 75 MCG tablet TAKE 1 TABLET BY MOUTH (75MCG TOTAL) DAILY 90 tablet 2  . losartan (COZAAR) 25 MG tablet TAKE 1 TABLET BY MOUTH EVERY DAY 90 tablet 1  . methotrexate (RHEUMATREX) 2.5 MG tablet Take by mouth.    . metolazone (ZAROXOLYN) 2.5 MG tablet Take 1 tablet (2.5 mg total) by mouth daily. 30 minutes before furosemide dose 30 tablet 1  . metoprolol succinate (TOPROL-XL) 25 MG  24 hr tablet TAKE 1 TABLET BY MOUTH EVERY DAY 90 tablet 3  . nitroGLYCERIN (NITROSTAT) 0.4 MG SL tablet Place 1 tablet (0.4 mg total) under the tongue every 5 (five) minutes as needed for chest pain. Maximum 3 doses 50 tablet 3  . omeprazole (PRILOSEC) 20 MG capsule TAKE 1 CAPSULE BY MOUTH TWICE A DAY BEFORE A MEAL 180 capsule 0  . ondansetron (ZOFRAN ODT) 4 MG disintegrating tablet Take 1 tablet (4 mg total) by mouth every 8 (eight) hours as needed for nausea or vomiting. 20 tablet 0  . polyethylene glycol (MIRALAX / GLYCOLAX) packet Take 17 g by mouth daily.    . Probiotic Product (PROBIOTIC PO) Take 1 tablet by mouth daily.    . sertraline (ZOLOFT) 25 MG tablet TAKE 1 TABLET BY MOUTH EVERY DAY 90 tablet 1  . sodium fluoride (PREVIDENT 5000 PLUS) 1.1 % CREA dental cream Take 1 application by mouth daily.     . SODIUM FLUORIDE 5000 PPM 1.1 % PSTE Take by mouth.    . tizanidine (ZANAFLEX) 2 MG capsule Take 1 capsule (2 mg total) by mouth 3 (three) times daily. 90 capsule 0  . traZODone (DESYREL) 50 MG tablet Take 1 tablet (50 mg total) by mouth at bedtime. 90 tablet 1  . triamcinolone ointment (KENALOG) 0.1 % Apply 1 application topically daily.  0   No current facility-administered medications for this visit.    Review of Systems  Constitutional: Positive for weight loss (down 2 lbs). Negative for chills, diaphoresis,  fever and malaise/fatigue.       Feels "good."  HENT: Positive for hearing loss. Negative for congestion, ear discharge, ear pain, nosebleeds, sinus pain, sore throat and tinnitus.        Xerostomia due to Sjogren's.  Eyes: Negative for blurred vision.  Respiratory: Negative for cough, hemoptysis, sputum production and shortness of breath.   Cardiovascular: Negative for chest pain, palpitations and leg swelling.  Gastrointestinal: Negative for abdominal pain, blood in stool, constipation, diarrhea, heartburn, melena, nausea and vomiting.  Genitourinary: Negative for dysuria, frequency, hematuria and urgency.  Musculoskeletal: Negative for back pain, joint pain, myalgias and neck pain.  Skin: Positive for rash (dry, anterior neck). Negative for itching.  Neurological: Negative for dizziness, tingling, sensory change, weakness and headaches.       Unstable on her feet, improving  Endo/Heme/Allergies: Bruises/bleeds easily (purpura on arms).       HYPOthyroidism on levothyroxine.   Psychiatric/Behavioral: Negative for depression and memory loss. The patient is not nervous/anxious and does not have insomnia.   All other systems reviewed and are negative.  Performance status (ECOG):  1  Vitals Blood pressure (!) 145/70, pulse 78, temperature 97.7 F (36.5 C), temperature source Tympanic, resp. rate 16, weight 145 lb 13.4 oz (66.2 kg), SpO2 100 %.   Physical Exam Vitals and nursing note reviewed.  Constitutional:      General: She is not in acute distress.    Appearance: Normal appearance. She is normal weight. She is not ill-appearing.     Comments: Requires assistance onto exam table.  HENT:     Head: Normocephalic and atraumatic.     Comments: Short styled gray hair.    Mouth/Throat:     Mouth: Mucous membranes are moist.     Pharynx: Oropharynx is clear. No oropharyngeal exudate or posterior oropharyngeal erythema.  Eyes:     General: No scleral icterus.    Extraocular Movements:  Extraocular movements intact.  Conjunctiva/sclera: Conjunctivae normal.     Pupils: Pupils are equal, round, and reactive to light.     Comments: Brown eyes.  Cardiovascular:     Rate and Rhythm: Normal rate and regular rhythm.     Pulses: Normal pulses.     Heart sounds: No murmur heard. No gallop.   Pulmonary:     Effort: Pulmonary effort is normal. No respiratory distress.     Breath sounds: Normal breath sounds. No wheezing, rhonchi or rales.  Chest:  Breasts:     Right: No swelling, bleeding, mass, skin change or tenderness.     Left: Absent. No swelling, bleeding, mass or tenderness.    Abdominal:     General: Bowel sounds are normal. There is no distension.     Palpations: Abdomen is soft. There is no hepatomegaly, splenomegaly or mass.     Tenderness: There is no abdominal tenderness.  Musculoskeletal:        General: No swelling.     Cervical back: Normal range of motion.     Right lower leg: No edema.     Left lower leg: No edema.  Lymphadenopathy:     Cervical: No cervical adenopathy.  Skin:    General: Skin is warm and dry.     Comments: Multiple small areas of ecchymosis on her arms, tender.  Dry rash on anterior neck- see photo.  Neurological:     General: No focal deficit present.     Mental Status: She is alert and oriented to person, place, and time. Mental status is at baseline.     Cranial Nerves: No cranial nerve deficit.  Psychiatric:        Mood and Affect: Mood normal.        Behavior: Behavior normal.        Thought Content: Thought content normal.        Judgment: Judgment normal.    10/10/2020, anterior neck rash     Appointment on 10/10/2020  Component Date Value Ref Range Status  . Sodium 10/10/2020 127* 135 - 145 mmol/L Final  . Potassium 10/10/2020 4.2  3.5 - 5.1 mmol/L Final  . Chloride 10/10/2020 93* 98 - 111 mmol/L Final  . CO2 10/10/2020 27  22 - 32 mmol/L Final  . Glucose, Bld 10/10/2020 99  70 - 99 mg/dL Final   Glucose  reference range applies only to samples taken after fasting for at least 8 hours.  . BUN 10/10/2020 18  8 - 23 mg/dL Final  . Creatinine, Ser 10/10/2020 0.76  0.44 - 1.00 mg/dL Final  . Calcium 10/10/2020 8.1* 8.9 - 10.3 mg/dL Final  . Total Protein 10/10/2020 6.6  6.5 - 8.1 g/dL Final  . Albumin 10/10/2020 3.5  3.5 - 5.0 g/dL Final  . AST 10/10/2020 21  15 - 41 U/L Final  . ALT 10/10/2020 9  0 - 44 U/L Final  . Alkaline Phosphatase 10/10/2020 78  38 - 126 U/L Final  . Total Bilirubin 10/10/2020 0.8  0.3 - 1.2 mg/dL Final  . GFR, Estimated 10/10/2020 >60  >60 mL/min Final   Comment: (NOTE) Calculated using the CKD-EPI Creatinine Equation (2021)   . Anion gap 10/10/2020 7  5 - 15 Final   Performed at [facility], [address] [place], [place] [address]  . WBC 10/10/2020 2.9* 4.0 - 10.5 K/uL Final  . RBC 10/10/2020 2.94* 3.87 - 5.11 MIL/uL Final  . Hemoglobin 10/10/2020 10.1* 12.0 - 15.0 g/dL Final  .  HCT 10/10/2020 30.2* 36.0 - 46.0 % Final  . MCV 10/10/2020 102.7* 80.0 - 100.0 fL Final  . MCH 10/10/2020 34.4* 26.0 - 34.0 pg Final  . MCHC 10/10/2020 33.4  30.0 - 36.0 g/dL Final  . RDW 10/10/2020 16.5* 11.5 - 15.5 % Final  . Platelets 10/10/2020 131* 150 - 400 K/uL Final  . nRBC 10/10/2020 0.0  0.0 - 0.2 % Final  . Neutrophils Relative % 10/10/2020 54  % Final  . Neutro Abs 10/10/2020 1.6* 1.7 - 7.7 K/uL Final  . Lymphocytes Relative 10/10/2020 26  % Final  . Lymphs Abs 10/10/2020 0.8  0.7 - 4.0 K/uL Final  . Monocytes Relative 10/10/2020 11  % Final  . Monocytes Absolute 10/10/2020 0.3  0.1 - 1.0 K/uL Final  . Eosinophils Relative 10/10/2020 5  % Final  . Eosinophils Absolute 10/10/2020 0.1  0.0 - 0.5 K/uL Final  . Basophils Relative 10/10/2020 2  % Final  . Basophils Absolute 10/10/2020 0.1  0.0 - 0.1 K/uL Final  . Immature Granulocytes 10/10/2020 2  % Final  . Abs Immature Granulocytes 10/10/2020 0.05  0.00 - 0.07 K/uL Final   Performed at [facility], [address], [place], [place] [address]    Assessment:  JOSEFINE FUHR is a 86 y.o. female  stage IIB left breast cancers/p mastectomy on 11/18/2015. Pathology revealed a 3.9 cm grade II invasive mammary carcinoma. There was ductal carcinoma in situ (DCIS). There was lymphvascular and dermal lymphatic invasion. Margins were negative. One of 2 sentinel lymph nodes were positive for macrometastasis. In addition, there were 3 benign axillary lymph nodes (total 5 lymph nodes). Tumor was estrogen receptor positive (100%), progesterone receptor positive (70%), and Her2/neu negative. Pathologic stage was T2N1a.   Bilateral mammogram and ultrasound on 11/11/2015 revealed a 3 cm left breast mass at the 6 o'clock position (1 cm from the nipple) and a 1.6 cm irregular hypoechoicleft breast mass at 1 o'clock position (5 cm from the nipple) with overlying skin thickening. Right mammogramon 11/18/2016 revealed no evidence of malignancy. Right mammogramon 11/23/2017 revealed no evidence of malignancy.  Right mammogram on 02/28/2019 revealed no evidence of malignancy.  Screening right mammogram on 02/29/2020 revealed no evidence of malignancy.  [place] 12/18/2015 revealed a low risk of recurrence with a 5% risk of distant recurrence of 5 years and a 10% recurrence at 10 years.  She declined radiation. She began [facility] 01/09/2016. She has a little joint pain and a few hot flashes.  PET scanon 12/23/2016 revealed slightly enlarged right lower paratracheal lymph node and some clustered small AP window lymph nodes with metabolic activity very minimally above background in the mediastinum. There were no other findings of hypermetabolic lesions in the neck, chest, abdomen, or pelvis. Chest CTon 03/16/2017 revealed stable mild mediastinal lymphadenopathy. Largest node was 1.0 cm in the right paratracheal region. There was no new or progressive disease within thorax. Chest CTon  09/23/2017 revealed stable mild mediastinal adenopathy (1.1 cm precarinal and 0.9 cm pretracheal lymph node). There was no new or progressive disease. Chest CTon 09/26/2018 revealed a stable exam.There was no change in the previous described mediastinal lymph nodes.There were stable tiny right lung nodules consistent with benign etiology.  Chest CT on 09/28/2019 revealed no acute cardiopulmonary disease.  There was subtle chronic stable bibasilar interstitial prominence.  There were a couple of tiny stable right lung nodules with the larger measuring 4 mm unchanged for 2 years and therefore considered benign.  There  was a stable 1.1 cm precarinal lymph node likely reactive.  There was mild anterior wedging of 2 adjacent midthoracic vertebral bodies unchanged and previous kyphoplasty in the region of the thoracolumbar junction, unchanged.  CA27.29 has been followed: 30.4 on 11/15/2015, 32.5 on 05/13/2016, 19.0 on 08/12/2016, 40.8 on 11/11/2016, 45.8 on 12/14/2016, 31.6 on 03/17/2017, 45.7 on 07/14/2017, 43.4 on 12/08/2017, 33.2 on 04/27/2018, 31.3 on 08/24/2018, 43.1 on 03/01/2019, 35.2 on 04/03/2019, 34.0 on 05/31/2019, 29.1 on 09/05/2019, and 25.0 on 04/09/2020.  Bone density on 12/05/2015 revealed osteopeniawith a T-score of -1.5 in the left femoral neck. Bone density on 12/07/2017 revealed osteopenia with a T-score of -1.6 in the right femoral neck. Bone density on 04/23/2020 revealed osteopenia with a T score of -1.6 at the right femur neck.  She is on calcium and vitamin D.  She has a history of a myocardial infarctionin 06/2012. Echocardiogram revealed an EF of 35%. Follow-up echo on 09/21/2015 revealed an EF of 55-60%. Patient is unaware of a history of heart failure. She has a history of thyroid carcinoma40 years ago treated with I-131.  She has a mild normocytic anemia. Diet is good. Colonoscopyon 07/25/2013 revealed 3 small polyps (33m proximal descending colon, 3 mm splenic  flexure, 1 mm ascending colon). She denies any melena or hematochezia. TSH was normal on 12/26/2015 and 08/24/2018.Copper was normal on 08/24/2018.  She has cutaneous lupusand Sjogren's. She had a reaction to Plaquenil. She completed an extended steroid taper. She is on methotrexate8 tablets every Wednesday (increased 3-4 weeks ago).  [facility]. She presented with left sided focal numbness and weakness. Head MRI and MRAwithout contrast revealed an acute versus subacute faint right almost nonhemorrhagic infarct. There was moderate to severe chronic small vessel ischemic changes. There was no large vesselflow-limiting stenosis. Bilateralcarotid ultrasound revealed<50% stenosis in the right and left internal carotid arteries  Symptomatically, she feels "good." She has upper extremity purpura that comes and goes. Her balance has improved and she has not had any falls. Her joint pain has resolved.  She has a dry rash on her anterior neck. She is having another lupus outbreak.  Exam is stable.  Plan: 1.   Labs today: CBC with diff, CMP, CA 27.29, ferritin, TSH, free T4, B12, folate. 2.Stage IIB left breast cancer Clinically,  she is doing well.   Exam reveals no evidence of recurrent disease. [place] mammogram on 02/29/2020 feels no evidence of recurrent disease. CA 27.29 is 6.4 (normal) today.   She is on Femara (began 01/09/2016).  BCI testing previously discussed with patient.  Patient declines. Anticipate discontinuation of Femara on 01/08/2021. 3.Leukopenia WBC 2900 with an ANC of 1600. Etiology felt likely secondary to lupus. TSH normal today.  Copper normal on 08/24/2018. B12 and folate normal today. Flow cytometry on04/13/2020 revealed noabnormality.  She has had no interval infections. Continue to monitor. 4.Anemia  Hematocrit 30.2.  Hemoglobin 10.1.  MCV 102.7. B12 and folate normal today. Ferritin  222 today. Suspect anemia of chronic disease. Patient with macrocytic indices- consider bone marrow depending on follow-up blood counts. 5.Mediastinal adenopathy Chest CT on 09/26/2018 revealed stable mediastinal adenopathy. Chest CT on 09/28/2019 revealed subtle chronic stable bibasilar interstitial prominence.    There were a couple of tiny stable right lung nodules with the largest 4 mm unchanged for 2 years (benign).    There was a stable 1.1 cm precarinal lymph node, likely reactive.    There was mild anterior wedging  of 2 adjacent midthoracic vertebral bodies unchanged. No current plan for reimaging. 6.Osteopenia  Bone density on 04/23/2020 revealed a T score of -1.6 at the right femur neck.    She is on calcium and vitamin D. Calcium 8.6 (corrected 9.0). Continue calcium and vitamin D. Patient's dentist declines approval for Prolia. Continue to monitor bone density every 2 years. 7.   Right mammogram on 02/28/2021. 8.   RN:  Call patient with corrected calcium. 9.   RTC in 1 month for MD assessment and labs (CBC with diff, copper, +/- others).  I discussed the assessment and treatment plan with the patient.  The patient was provided an opportunity to ask questions and all were answered.  The patient agreed with the plan and demonstrated an understanding of the instructions.  The patient was advised to call back if the symptoms worsen or if the condition fails to improve as anticipated.  I provided 21 minutes of face-to-face time during this this encounter and > 50% was spent counseling as documented under my assessment and plan.  An additional 8-10 minutes were spent reviewing her chart (Epic and Care Everywhere) including notes, labs, and imaging studies.    Lequita Asal, MD, PhD 10/10/2020, 10:52 AM  I, Mirian Mo Tufford, am acting as a Education administrator for Lequita Asal, MD.  I, [place] Mike Gip, MD, have reviewed the above documentation for accuracy and completeness,  and I agree with the above.

## 2020-10-10 ENCOUNTER — Telehealth: Payer: Self-pay

## 2020-10-10 ENCOUNTER — Other Ambulatory Visit: Payer: Self-pay

## 2020-10-10 ENCOUNTER — Inpatient Hospital Stay: Payer: PPO

## 2020-10-10 ENCOUNTER — Encounter: Payer: Self-pay | Admitting: Hematology and Oncology

## 2020-10-10 ENCOUNTER — Inpatient Hospital Stay: Payer: PPO | Attending: Hematology and Oncology | Admitting: Hematology and Oncology

## 2020-10-10 VITALS — BP 145/70 | HR 78 | Temp 97.7°F | Resp 16 | Wt 145.8 lb

## 2020-10-10 DIAGNOSIS — Z8673 Personal history of transient ischemic attack (TIA), and cerebral infarction without residual deficits: Secondary | ICD-10-CM | POA: Diagnosis not present

## 2020-10-10 DIAGNOSIS — D72819 Decreased white blood cell count, unspecified: Secondary | ICD-10-CM | POA: Insufficient documentation

## 2020-10-10 DIAGNOSIS — R918 Other nonspecific abnormal finding of lung field: Secondary | ICD-10-CM | POA: Insufficient documentation

## 2020-10-10 DIAGNOSIS — D539 Nutritional anemia, unspecified: Secondary | ICD-10-CM | POA: Diagnosis not present

## 2020-10-10 DIAGNOSIS — E039 Hypothyroidism, unspecified: Secondary | ICD-10-CM | POA: Diagnosis not present

## 2020-10-10 DIAGNOSIS — I252 Old myocardial infarction: Secondary | ICD-10-CM | POA: Diagnosis not present

## 2020-10-10 DIAGNOSIS — Z780 Asymptomatic menopausal state: Secondary | ICD-10-CM | POA: Diagnosis not present

## 2020-10-10 DIAGNOSIS — M85851 Other specified disorders of bone density and structure, right thigh: Secondary | ICD-10-CM

## 2020-10-10 DIAGNOSIS — M3500 Sicca syndrome, unspecified: Secondary | ICD-10-CM | POA: Diagnosis not present

## 2020-10-10 DIAGNOSIS — D649 Anemia, unspecified: Secondary | ICD-10-CM | POA: Insufficient documentation

## 2020-10-10 DIAGNOSIS — M8580 Other specified disorders of bone density and structure, unspecified site: Secondary | ICD-10-CM | POA: Diagnosis not present

## 2020-10-10 DIAGNOSIS — C50912 Malignant neoplasm of unspecified site of left female breast: Secondary | ICD-10-CM | POA: Diagnosis not present

## 2020-10-10 DIAGNOSIS — L931 Subacute cutaneous lupus erythematosus: Secondary | ICD-10-CM | POA: Insufficient documentation

## 2020-10-10 DIAGNOSIS — Z9012 Acquired absence of left breast and nipple: Secondary | ICD-10-CM | POA: Diagnosis not present

## 2020-10-10 DIAGNOSIS — D692 Other nonthrombocytopenic purpura: Secondary | ICD-10-CM | POA: Insufficient documentation

## 2020-10-10 DIAGNOSIS — Z79811 Long term (current) use of aromatase inhibitors: Secondary | ICD-10-CM | POA: Insufficient documentation

## 2020-10-10 DIAGNOSIS — Z170 Estrogen receptor positive status [ER+]: Secondary | ICD-10-CM | POA: Diagnosis not present

## 2020-10-10 DIAGNOSIS — R590 Localized enlarged lymph nodes: Secondary | ICD-10-CM | POA: Diagnosis not present

## 2020-10-10 DIAGNOSIS — C50911 Malignant neoplasm of unspecified site of right female breast: Secondary | ICD-10-CM | POA: Diagnosis not present

## 2020-10-10 DIAGNOSIS — Z85850 Personal history of malignant neoplasm of thyroid: Secondary | ICD-10-CM | POA: Diagnosis not present

## 2020-10-10 DIAGNOSIS — R21 Rash and other nonspecific skin eruption: Secondary | ICD-10-CM | POA: Diagnosis not present

## 2020-10-10 LAB — CBC WITH DIFFERENTIAL/PLATELET
Abs Immature Granulocytes: 0.05 10*3/uL (ref 0.00–0.07)
Basophils Absolute: 0.1 10*3/uL (ref 0.0–0.1)
Basophils Relative: 2 %
Eosinophils Absolute: 0.1 10*3/uL (ref 0.0–0.5)
Eosinophils Relative: 5 %
HCT: 30.2 % — ABNORMAL LOW (ref 36.0–46.0)
Hemoglobin: 10.1 g/dL — ABNORMAL LOW (ref 12.0–15.0)
Immature Granulocytes: 2 %
Lymphocytes Relative: 26 %
Lymphs Abs: 0.8 10*3/uL (ref 0.7–4.0)
MCH: 34.4 pg — ABNORMAL HIGH (ref 26.0–34.0)
MCHC: 33.4 g/dL (ref 30.0–36.0)
MCV: 102.7 fL — ABNORMAL HIGH (ref 80.0–100.0)
Monocytes Absolute: 0.3 10*3/uL (ref 0.1–1.0)
Monocytes Relative: 11 %
Neutro Abs: 1.6 10*3/uL — ABNORMAL LOW (ref 1.7–7.7)
Neutrophils Relative %: 54 %
Platelets: 131 10*3/uL — ABNORMAL LOW (ref 150–400)
RBC: 2.94 MIL/uL — ABNORMAL LOW (ref 3.87–5.11)
RDW: 16.5 % — ABNORMAL HIGH (ref 11.5–15.5)
WBC: 2.9 10*3/uL — ABNORMAL LOW (ref 4.0–10.5)
nRBC: 0 % (ref 0.0–0.2)

## 2020-10-10 LAB — FERRITIN: Ferritin: 222 ng/mL (ref 11–307)

## 2020-10-10 LAB — TSH: TSH: 0.742 u[IU]/mL (ref 0.350–4.500)

## 2020-10-10 LAB — T4, FREE: Free T4: 1.17 ng/dL — ABNORMAL HIGH (ref 0.61–1.12)

## 2020-10-10 LAB — FOLATE: Folate: 41 ng/mL (ref >5.9)

## 2020-10-10 LAB — VITAMIN B12: Vitamin B-12: 541 pg/mL (ref 180–914)

## 2020-10-10 NOTE — Patient Instructions (Signed)
  Stop Femara on 01/08/2021.

## 2020-10-11 ENCOUNTER — Telehealth: Payer: Self-pay

## 2020-10-11 LAB — COMPREHENSIVE METABOLIC PANEL
ALT: 9 U/L (ref 0–44)
AST: 21 U/L (ref 15–41)
Albumin: 3.5 g/dL (ref 3.5–5.0)
Alkaline Phosphatase: 78 U/L (ref 38–126)
Anion gap: 7 (ref 5–15)
BUN: 18 mg/dL (ref 8–23)
CO2: 27 mmol/L (ref 22–32)
Calcium: 8.6 mg/dL — ABNORMAL LOW (ref 8.9–10.3)
Chloride: 93 mmol/L — ABNORMAL LOW (ref 98–111)
Creatinine, Ser: 0.76 mg/dL (ref 0.44–1.00)
GFR, Estimated: >60 mL/min (ref >60)
Glucose, Bld: 99 mg/dL (ref 70–99)
Potassium: 4.2 mmol/L (ref 3.5–5.1)
Sodium: 127 mmol/L — ABNORMAL LOW (ref 135–145)
Total Bilirubin: 0.8 mg/dL (ref 0.3–1.2)
Total Protein: 6.6 g/dL (ref 6.5–8.1)

## 2020-10-11 LAB — CANCER ANTIGEN 27.29: CA 27.29: 26.4 U/mL (ref 0.0–38.6)

## 2020-10-11 NOTE — Telephone Encounter (Signed)
I CALLED AND SPOKE WITH PATIENT IN REGARDS TO her CALCIUM LEVEL FROM YESTERDAY. Calcium level was rechecked and everything turned out to be fine. Called to inform patient to disregard any instruction regarding calcium medication given yesterday 02/03.

## 2020-10-14 ENCOUNTER — Telehealth: Payer: Self-pay

## 2020-10-16 ENCOUNTER — Telehealth: Payer: Self-pay | Admitting: Hematology and Oncology

## 2020-10-16 NOTE — Telephone Encounter (Signed)
Pt returned call about receiving results from clinical staff. Would like the call return today please.

## 2020-10-18 ENCOUNTER — Encounter: Payer: Self-pay | Admitting: Hematology and Oncology

## 2020-10-19 ENCOUNTER — Other Ambulatory Visit: Payer: Self-pay | Admitting: Internal Medicine

## 2020-10-24 ENCOUNTER — Other Ambulatory Visit: Payer: Self-pay

## 2020-10-24 ENCOUNTER — Inpatient Hospital Stay: Payer: PPO

## 2020-10-24 DIAGNOSIS — C50912 Malignant neoplasm of unspecified site of left female breast: Secondary | ICD-10-CM

## 2020-10-24 DIAGNOSIS — C50911 Malignant neoplasm of unspecified site of right female breast: Secondary | ICD-10-CM | POA: Diagnosis not present

## 2020-10-24 LAB — BASIC METABOLIC PANEL
Anion gap: 8 (ref 5–15)
BUN: 15 mg/dL (ref 8–23)
CO2: 27 mmol/L (ref 22–32)
Calcium: 8.8 mg/dL — ABNORMAL LOW (ref 8.9–10.3)
Chloride: 97 mmol/L — ABNORMAL LOW (ref 98–111)
Creatinine, Ser: 0.72 mg/dL (ref 0.44–1.00)
GFR, Estimated: >60 mL/min (ref >60)
Glucose, Bld: 120 mg/dL — ABNORMAL HIGH (ref 70–99)
Potassium: 4.6 mmol/L (ref 3.5–5.1)
Sodium: 132 mmol/L — ABNORMAL LOW (ref 135–145)

## 2020-10-25 DIAGNOSIS — D692 Other nonthrombocytopenic purpura: Secondary | ICD-10-CM | POA: Diagnosis not present

## 2020-10-25 DIAGNOSIS — Z79899 Other long term (current) drug therapy: Secondary | ICD-10-CM | POA: Diagnosis not present

## 2020-10-25 DIAGNOSIS — L932 Other local lupus erythematosus: Secondary | ICD-10-CM | POA: Diagnosis not present

## 2020-10-31 ENCOUNTER — Other Ambulatory Visit: Payer: Self-pay

## 2020-10-31 DIAGNOSIS — C50912 Malignant neoplasm of unspecified site of left female breast: Secondary | ICD-10-CM

## 2020-10-31 DIAGNOSIS — D539 Nutritional anemia, unspecified: Secondary | ICD-10-CM

## 2020-11-07 ENCOUNTER — Other Ambulatory Visit: Payer: PPO

## 2020-11-07 ENCOUNTER — Ambulatory Visit: Payer: PPO | Admitting: Hematology and Oncology

## 2020-11-10 DIAGNOSIS — N390 Urinary tract infection, site not specified: Secondary | ICD-10-CM | POA: Diagnosis not present

## 2020-11-11 ENCOUNTER — Telehealth: Payer: Self-pay | Admitting: Internal Medicine

## 2020-11-11 NOTE — Telephone Encounter (Signed)
Recvd records from [employer] [facility] forwarded 4 pages to Dr. Deborra Medina 3/7/22fbg

## 2020-11-13 ENCOUNTER — Other Ambulatory Visit: Payer: Self-pay | Admitting: Internal Medicine

## 2020-11-20 ENCOUNTER — Other Ambulatory Visit: Payer: Self-pay

## 2020-11-20 ENCOUNTER — Encounter: Payer: Self-pay | Admitting: Internal Medicine

## 2020-11-20 ENCOUNTER — Ambulatory Visit (INDEPENDENT_AMBULATORY_CARE_PROVIDER_SITE_OTHER): Payer: PPO | Admitting: Internal Medicine

## 2020-11-20 DIAGNOSIS — D692 Other nonthrombocytopenic purpura: Secondary | ICD-10-CM

## 2020-11-20 DIAGNOSIS — I69359 Hemiplegia and hemiparesis following cerebral infarction affecting unspecified side: Secondary | ICD-10-CM | POA: Diagnosis not present

## 2020-11-20 DIAGNOSIS — E034 Atrophy of thyroid (acquired): Secondary | ICD-10-CM

## 2020-11-20 DIAGNOSIS — R5383 Other fatigue: Secondary | ICD-10-CM

## 2020-11-20 DIAGNOSIS — F418 Other specified anxiety disorders: Secondary | ICD-10-CM

## 2020-11-20 DIAGNOSIS — L930 Discoid lupus erythematosus: Secondary | ICD-10-CM | POA: Diagnosis not present

## 2020-11-20 DIAGNOSIS — M3500 Sicca syndrome, unspecified: Secondary | ICD-10-CM | POA: Diagnosis not present

## 2020-11-20 DIAGNOSIS — D702 Other drug-induced agranulocytosis: Secondary | ICD-10-CM

## 2020-11-20 DIAGNOSIS — R4589 Other symptoms and signs involving emotional state: Secondary | ICD-10-CM

## 2020-11-20 DIAGNOSIS — I10 Essential (primary) hypertension: Secondary | ICD-10-CM

## 2020-11-20 DIAGNOSIS — N390 Urinary tract infection, site not specified: Secondary | ICD-10-CM | POA: Diagnosis not present

## 2020-11-20 DIAGNOSIS — I69398 Other sequelae of cerebral infarction: Secondary | ICD-10-CM

## 2020-11-20 DIAGNOSIS — F331 Major depressive disorder, recurrent, moderate: Secondary | ICD-10-CM

## 2020-11-20 DIAGNOSIS — D369 Benign neoplasm, unspecified site: Secondary | ICD-10-CM | POA: Diagnosis not present

## 2020-11-20 DIAGNOSIS — D638 Anemia in other chronic diseases classified elsewhere: Secondary | ICD-10-CM

## 2020-11-20 DIAGNOSIS — R2689 Other abnormalities of gait and mobility: Secondary | ICD-10-CM

## 2020-11-20 NOTE — Progress Notes (Signed)
Subjective:  Patient ID: Kim Santiago, female    DOB: [birth year]  Age: 86 y.o. MRN: [id number]  CC: Diagnoses of Other drug-induced neutropenia ([place]), Hemiplegia affecting dominant side, post-stroke (HCC), Sicca syndrome ([place]), Major depressive disorder, recurrent episode, moderate ([place]), Purpura ([place]), Adenomatous polyp, Anxiety about health, Anemia in other chronic diseases classified elsewhere, Discoid lupus, Hypothyroidism due to acquired atrophy of thyroid, Impaired balance as late effect of cerebrovascular accident, Fatigue, unspecified type, and Essential hypertension were pertinent to this visit.  HPI Kim Santiago presents for follow up on hyperlipidemia and hypertension    This visit occurred during the SARS-CoV-2 public health emergency.  Safety protocols were in place, including screening questions prior to the visit, additional usage of staff PPE, and extensive cleaning of exam room while observing appropriate contact time as indicated for disinfecting solutions.   Patient is taking her medications as prescribed and notes no adverse effects  Taking losartan 25 mg in the morning .  Home BP readings have been done about two times per  week and are  generally < 130/80 .  She is avoiding added salt in her diet and walking regularly , weather permitting about 3 times per week for exercise  .  Had an episode of extreme fatigue  while visiting her  [place] in [place]. The fatigue was accompanied by transient left  lateral flank pain , now resolved (pulled muscle).  She denies abdominal pain,  Change in color of stools or urine,  Fevers,  Chest pain and shortness of breath. When she returned home she had a urinalysis checked  Negative    She reports intermittent episodes of vertigo that occur when lying down with change of head position.  She is not unsteady on her feet .    Has a list of concerns and questions,  All of which were addressed today :  1) Fatigue "is it my anemia?"  10.2    2) BRCA  5 yr anniversary April 2022 3) nonhealing lesion on right ear:  4) discoid lupus:  MTX stopped by [facility] due to chest wall erythema . Has not had MTX since Feb 13 (per Feb 19 note) does not have a follow up the chest wall erythema has not improved, but her skin is sore to touch    Outpatient Medications Prior to Visit  Medication Sig Dispense Refill  . atorvastatin (LIPITOR) 20 MG tablet TAKE 1 TABLET BY MOUTH DAILY AT 6PM 90 tablet 1  . Calcium Carbonate-Vitamin D 600-200 MG-UNIT TABS Take 2 tablets by mouth daily.    . cholecalciferol (VITAMIN D) 1000 UNITS tablet Take 2,000 Units by mouth daily.    Mar[place] Kitchen CRANBERRY PO Take 1 capsule by mouth 2 (two) times daily.    . furosemide (LASIX) 20 MG tablet 1 TABLET EVERY OTHER DAY AS NEEDED 45 tablet 2  . letrozole (FEMARA) 2.5 MG tablet TAKE 1 TABLET BY MOUTH EVERY DAY 90 tablet 3  . levothyroxine (SYNTHROID) 75 MCG tablet TAKE 1 TABLET BY MOUTH (75MCG TOTAL) DAILY 90 tablet 2  . losartan (COZAAR) 25 MG tablet TAKE 1 TABLET BY MOUTH EVERY DAY 90 tablet 1  . metoprolol succinate (TOPROL-XL) 25 MG 24 hr tablet TAKE 1 TABLET BY MOUTH EVERY DAY 90 tablet 3  . nitrofurantoin (MACRODANTIN) 100 MG capsule Take 100 mg by mouth 2 (two) times daily.    . nitroGLYCERIN (NITROSTAT) 0.4 MG SL tablet Place 1 tablet (0.4 mg total) under the tongue every 5 (five) minutes  as needed for chest pain. Maximum 3 doses 50 tablet 3  . omeprazole (PRILOSEC) 20 MG capsule TAKE 1 CAPSULE BY MOUTH TWICE A DAY BEFORE A MEAL 180 capsule 0  . polyethylene glycol (MIRALAX / GLYCOLAX) packet Take 17 g by mouth daily.    . Probiotic Product (PROBIOTIC PO) Take 1 tablet by mouth daily.    . sodium fluoride (PREVIDENT 5000 PLUS) 1.1 % CREA dental cream Take 1 application by mouth daily.     . SODIUM FLUORIDE 5000 PPM 1.1 % PSTE Take by mouth.    . traZODone (DESYREL) 50 MG tablet Take 1 tablet (50 mg total) by mouth at bedtime. 90 tablet 1  . triamcinolone ointment (KENALOG)  0.1 % Apply 1 application topically daily.  0  . sertraline (ZOLOFT) 25 MG tablet TAKE 1 TABLET BY MOUTH EVERY DAY 90 tablet 1  . folic acid (FOLVITE) 1 MG tablet Take by mouth. (Patient not taking: Reported on 11/20/2020)    . methotrexate (RHEUMATREX) 2.5 MG tablet Take by mouth. (Patient not taking: Reported on 11/20/2020)    . ondansetron (ZOFRAN ODT) 4 MG disintegrating tablet Take 1 tablet (4 mg total) by mouth every 8 (eight) hours as needed for nausea or vomiting. (Patient not taking: Reported on 11/20/2020) 20 tablet 0  . tizanidine (ZANAFLEX) 2 MG capsule Take 1 capsule (2 mg total) by mouth 3 (three) times daily. (Patient not taking: Reported on 11/20/2020) 90 capsule 0  . metolazone (ZAROXOLYN) 2.5 MG tablet Take 1 tablet (2.5 mg total) by mouth daily. 30 minutes before furosemide dose (Patient not taking: Reported on 11/20/2020) 30 tablet 1   No facility-administered medications prior to visit.    Review of Systems;  Patient denies headache, fevers, malaise, unintentional weight loss, skin rash, eye pain, sinus congestion and sinus pain, sore throat, dysphagia,  hemoptysis , cough, dyspnea, wheezing, chest pain, palpitations, orthopnea, edema, abdominal pain, nausea, melena, diarrhea, constipation, flank pain, dysuria, hematuria, urinary  Frequency, nocturia, numbness, tingling, seizures,  Focal weakness, Loss of consciousness,  Tremor, insomnia, depression, anxiety, and suicidal ideation.      Objective:  BP (!) 148/82 (BP Location: Right Arm, Patient Position: Sitting, Cuff Size: Normal)   Pulse 72   Temp 98.3 F (36.8 C) (Oral)   Resp 15   Ht 5' 3"  (1.6 m)   Wt 141 lb 12.8 oz (64.3 kg)   SpO2 99%   BMI 25.12 kg/m   BP Readings from Last 3 Encounters:  11/20/20 (!) 148/82  10/10/20 (!) 145/70  07/23/20 (!) 146/78    Wt Readings from Last 3 Encounters:  11/20/20 141 lb 12.8 oz (64.3 kg)  10/10/20 145 lb 13.4 oz (66.2 kg)  08/20/20 145 lb 6.4 oz (66 kg)    General  appearance: alert, cooperative and appears stated age Ears: normal TM's and external ear canals both ears Throat: lips, mucosa, and tongue normal; teeth and gums normal Neck: no adenopathy, no carotid bruit, supple, symmetrical, trachea midline and thyroid not enlarged, symmetric, no tenderness/mass/nodules Back: symmetric, no curvature. ROM normal. No CVA tenderness. Lungs: clear to auscultation bilaterally Heart: regular rate and rhythm, S1, S2 normal, no murmur, click, rub or gallop Abdomen: soft, non-tender; bowel sounds normal; no masses,  no organomegaly Pulses: 2+ and symmetric Skin: Skin color : mild erythema of neck and chest wall.  Arms and upper thighs with scattered purpura .   Lymph nodes: Cervical, supraclavicular, and axillary nodes normal. Neuro:  awake and interactive with normal mood  and affect. Higher cortical functions are normal. Speech is clear without word-finding difficulty or dysarthria. Extraocular movements are intact. Visual fields of both eyes are grossly intact. Sensation to light touch is grossly intact bilaterally of upper and lower extremities. Motor examination shows 4+/5 symmetric hand grip and upper extremity and 5/5 lower extremity strength. There is no pronation or drift. Gait is non-ataxic    Lab Results  Component Value Date   HGBA1C 4.9 11/08/2018    Lab Results  Component Value Date   CREATININE 0.72 10/24/2020   CREATININE 0.76 10/10/2020   CREATININE 0.77 08/27/2020    Lab Results  Component Value Date   WBC 2.9 (L) 10/10/2020   HGB 10.1 (L) 10/10/2020   HCT 30.2 (L) 10/10/2020   PLT 131 (L) 10/10/2020   GLUCOSE 120 (H) 10/24/2020   CHOL 92 07/23/2020   TRIG 110.0 07/23/2020   HDL 31.40 (L) 07/23/2020   LDLDIRECT 117.6 12/27/2012   LDLCALC 39 07/23/2020   ALT 9 10/10/2020   AST 21 10/10/2020   NA 132 (L) 10/24/2020   K 4.6 10/24/2020   CL 97 (L) 10/24/2020   CREATININE 0.72 10/24/2020   BUN 15 10/24/2020   CO2 27 10/24/2020    TSH 0.742 10/10/2020   INR 1.0 10/10/2019   HGBA1C 4.9 11/08/2018    DG Bone Density  Result Date: 04/23/2020 EXAM: DUAL X-RAY ABSORPTIOMETRY (DXA) FOR BONE MINERAL DENSITY IMPRESSION: crr Your patient Nyliah Nierenberg completed a BMD test on 04/23/2020 using the [place] (analysis version: 14.10) manufactured by [employer]. The following summarizes the results of our evaluation. PATIENT BIOGRAPHICAL: Name: Bryanna, Yim Patient ID: [id number] Birth Date: [birth year] Height: 63.0 in. Gender: Female Exam Date: 04/23/2020 Weight: 138.8 lbs. Indications: Advanced Age, Caucasian, Early Menopause, Height Loss, high risk meds, hx breast ca, hx thyroid ca, lupus, osteopenia, POSTmenopausal Fractures: Treatments: calcium /vit d, letrozole, levothyroxine ASSESSMENT: The BMD measured at Femur Neck Right is 0.811 g/cm2 with a T-score of -1.6. This patient is considered osteopenic according to [facility]) criteria. L-2 was excluded due to degenerative changes. The scan quality is limited by exclusion of L-2. Site Region Measured Measured WHO Young Adult BMD Date       Age      Classification T-score AP Spine L1-L4 (L2) 04/23/2020 83.3 Normal -0.4 1.124 g/cm2 AP Spine L1-L4 (L2) 12/07/2017 80.9 Normal -0.3 1.134 g/cm2 AP Spine L1-L4 (L2) 12/05/2015 78.9 Normal -0.3 1.134 g/cm2 DualFemur Neck Right 04/23/2020 83.3 Osteopenia -1.6 0.811 g/cm2 DualFemur Neck Right 12/07/2017 80.9 Osteopenia -1.6 0.813 g/cm2 DualFemur Neck Right 12/05/2015 78.9 Osteopenia -1.5 0.829 g/cm2 DualFemur Total Mean 04/23/2020 83.3 Osteopenia -1.2 0.857 g/cm2 DualFemur Total Mean 12/07/2017 80.9 Osteopenia -1.1 0.872 g/cm2 DualFemur Total Mean 12/05/2015 78.9 Osteopenia -1.1 0.867 g/cm2 World Health Organization [facility]) criteria for post-menopausal, Caucasian Women: Normal:       T-score at or above -1 SD Osteopenia:   T-score between -1 and -2.5 SD Osteoporosis: T-score at or below -2.5 SD  RECOMMENDATIONS: 1. All patients should optimize calcium and vitamin D intake. 2. Consider FDA-approved medical therapies in postmenopausal women and men aged 51 years and older, based on the following: a. A hip or vertebral (clinical or morphometric) fracture b. T-score < -2.5 at the femoral neck or spine after appropriate evaluation to exclude secondary causes c. Low bone mass (T-score between -1.0 and -2.5 at the femoral neck or spine) and a 10-year probability of a hip fracture > 3% or  a 10-year probability of a major osteoporosis-related fracture > 20% based on the [place]-adapted WHO algorithm d. Clinician judgment and/or patient preferences may indicate treatment for people with 10-year fracture probabilities above or below these levels FOLLOW-UP: People with diagnosed cases of osteoporosis or at high risk for fracture should have regular bone mineral density tests. For patients eligible for Medicare, routine testing is allowed once every 2 years. The testing frequency can be increased to one year for patients who have rapidly progressing disease, those who are receiving or discontinuing medical therapy to restore bone mass, or have additional risk factors. I have reviewed this report, and agree with the above findings. [facility] crr Your patient JURY CASERTA completed a FRAX assessment on 04/23/2020 using the [place] (analysis version: 14.10) manufactured by [employer]. The following summarizes the results of our evaluation. PATIENT BIOGRAPHICAL: Name: Shonica, Weier Patient ID: [id number] Birth Date: May 13, 1935 Height:    63.0 in. Gender:     [age]    [age]       Weight:    138.8 lbs. Ethnicity:  White                            Exam Date: 04/23/2020 FRAX* RESULTS:  (version: 3.5) 10-year Probability of Fracture1 Major Osteoporotic Fracture2 Hip Fracture 14.2% 4.0% Population: [place] (Caucasian) Risk Factors: None Based on Femur (Right) Neck BMD 1 -The  10-year probability of fracture may be lower than reported if the patient has received treatment. 2 -Major Osteoporotic Fracture: Clinical Spine, Forearm, Hip or Shoulder *FRAX is a Materials engineer of the [employer] for Metabolic Bone Disease, a [place] (WHO) [facility]. ASSESSMENT: The probability of a major osteoporotic fracture is 14.2% within the next ten years. The probability of a hip fracture is 4.0% within the next ten years. Electronically Signed   By: Lowella Grip III M.D.   On: 04/23/2020 11:49    Assessment & Plan:   Problem List Items Addressed This Visit      Unprioritized   Adenomatous polyp    Recovered during 2014 colonoscopy.  Follow up (5 yr ) was deferred in 2019 due to multiple comobordities.  Will revisit at next follow up      Anemia    With leukopenia, anemia and thrombocytopenia by recent CBC.  Felt to be secondary to lupus. Following up with hematology/ Oncology   Lab Results  Component Value Date   WBC 2.9 (L) 10/10/2020   HGB 10.1 (L) 10/10/2020   HCT 30.2 (L) 10/10/2020   MCV 102.7 (H) 10/10/2020   PLT 131 (L) 10/10/2020         Anxiety about health    She is sleeping better and no longer taking mirtazipine or sertraline       Discoid lupus    Currently managed with MTX by Baxter Kail ,  [facility].  Previous intolerances to Plazquenil, azothioprine, Orudis.       Essential hypertension    No changes to regimen today. Patient is taking her medications as prescribed and notes no adverse effects.        Fatigue    Likely due to excessive exertion,  As her hemoglobin is stable and > 10.  All other screening tests reviewed and noncontributory      Hemiplegia affecting dominant side, post-stroke ([place])  She requires mild assistance with stepping up onto the exam table  Due to left leg weakness, but is managing her ADLS without assistance       Hypothyroidism due to acquired  atrophy of thyroid    Thyroid function has been  WNL on current dose of 75 mcg daily .  No current changes needed.   Lab Results  Component Value Date   TSH 0.742 10/10/2020         Impaired balance as late effect of cerebrovascular accident    reassured that her brief episodes of vertigo while in bed turning head are due to BPV.          Major depressive disorder, recurrent episode, moderate (HCC)    Symptoms have not returned and she remains positive and optimistic due to her strong [place] faith, which was shared today with me.  She has a strong support system       Other drug-induced neutropenia (HCC)   Purpura ([place])    Other Visit Diagnoses    Sicca syndrome ([place])   (Chronic)        I have discontinued Rukaya R. Konecny "Emma"'s metolazone and sertraline. I am also having her maintain her cholecalciferol, Calcium Carbonate-Vitamin D, polyethylene glycol, tizanidine, ondansetron, triamcinolone ointment, Probiotic Product (PROBIOTIC PO), nitroGLYCERIN, CRANBERRY PO, sodium fluoride, omeprazole, metoprolol succinate, levothyroxine, traZODone, letrozole, folic acid, methotrexate, atorvastatin, Sodium Fluoride 5000 PPM, furosemide, losartan, and nitrofurantoin.  No orders of the defined types were placed in this encounter. A total of 40 minutes was spent in reviewing patient's chart,  in face to face counseling patient on the above mentioned issues in person,  , reviewing and explaining recent labs and imaging studies done, and coordination of care.  Medications Discontinued During This Encounter  Medication Reason  . metolazone (ZAROXOLYN) 2.5 MG tablet   . sertraline (ZOLOFT) 25 MG tablet     Follow-up: Return in about 3 months (around 02/20/2021).   [name], MD

## 2020-11-20 NOTE — Patient Instructions (Signed)
Your fatigue may be due to your conditions,  Not the anemia  If your hemoglobin hasn't dropped any more,  I will recommend Physical Therapy    For your blood pressure  Continue losartan 25 mg daily and metoprolol 25 mg daily   check BP once daily  At various times and send me a week's worth of readings    Benign Positional Vertigo Vertigo is the feeling that you or your surroundings are moving when they are not. Benign positional vertigo is the most common form of vertigo. This is usually a harmless condition (benign). This condition is positional. This means that symptoms are triggered by certain movements and positions. This condition can be dangerous if it occurs while you are doing something that could cause harm to you or others. This includes activities such as driving or operating machinery. What are the causes? The inner ear has fluid-filled canals that help your brain sense movement and balance. When the fluid moves, the brain receives messages about your body's position. With benign positional vertigo, crystals in the inner ear break free and disturb the inner ear area. This causes your brain to receive confusing messages about your body's position. What increases the risk? You are more likely to develop this condition if:  You are a woman.  You are 61 years of age or older.  You have recently had a head injury.  You have an inner ear disease. What are the signs or symptoms? Symptoms of this condition usually happen when you move your head or your eyes in different directions. Symptoms may start suddenly, and usually last for less than a minute. They include:  Loss of balance and falling.  Feeling like you are spinning or moving.  Feeling like your surroundings are spinning or moving.  Nausea and vomiting.  Blurred vision.  Dizziness.  Involuntary eye movement (nystagmus). Symptoms can be mild and cause only minor problems, or they can be severe and interfere  with daily life. Episodes of benign positional vertigo may return (recur) over time. Symptoms may improve over time. How is this diagnosed? This condition may be diagnosed based on:  Your medical history.  Physical exam of the head, neck, and ears.  Positional tests to check for or stimulate vertigo. You may be asked to turn your head and change positions, such as going from sitting to lying down. A health care provider will watch for symptoms of vertigo. You may be referred to a health care provider who specializes in ear, nose, and throat problems (ENT, or otolaryngologist) or a provider who specializes in disorders of the nervous system (neurologist). How is this treated? This condition may be treated in a session in which your health care provider moves your head in specific positions to help the displaced crystals in your inner ear move. Treatment for this condition may take several sessions. Surgery may be needed in severe cases, but this is rare. In some cases, benign positional vertigo may resolve on its own in 2-4 weeks.   Follow these instructions at home: Safety  Move slowly. Avoid sudden body or head movements or certain positions, as told by your health care provider.  Avoid driving until your health care provider says it is safe for you to do so.  Avoid operating heavy machinery until your health care provider says it is safe for you to do so.  Avoid doing any tasks that would be dangerous to you or others if vertigo occurs.  If you have trouble  walking or keeping your balance, try using a cane for stability. If you feel dizzy or unstable, sit down right away.  Return to your normal activities as told by your health care provider. Ask your health care provider what activities are safe for you. General instructions  Take over-the-counter and prescription medicines only as told by your health care provider.  Drink enough fluid to keep your urine pale yellow.  Keep all  follow-up visits as told by your health care provider. This is important. Contact a health care provider if:  You have a fever.  Your condition gets worse or you develop new symptoms.  Your family or friends notice any behavioral changes.  You have nausea or vomiting that gets worse.  You have numbness or a prickling and tingling sensation. Get help right away if you:  Have difficulty speaking or moving.  Are always dizzy.  Faint.  Develop severe headaches.  Have weakness in your legs or arms.  Have changes in your hearing or vision.  Develop a stiff neck.  Develop sensitivity to light. Summary  Vertigo is the feeling that you or your surroundings are moving when they are not. Benign positional vertigo is the most common form of vertigo.  This condition is caused by crystals in the inner ear that become displaced. This causes a disturbance in an area of the inner ear that helps your brain sense movement and balance.  Symptoms include loss of balance and falling, feeling that you or your surroundings are moving, nausea and vomiting, and blurred vision.  This condition can be diagnosed based on symptoms, a physical exam, and positional tests.  Follow safety instructions as told by your health care provider. You will also be told when to contact your health care provider in case of problems. This information is not intended to replace advice given to you by your health care provider. Make sure you discuss any questions you have with your health care provider. Document Revised: 07/18/2019 Document Reviewed: 02/02/2018 Elsevier Patient Education  2021 [employer].

## 2020-11-21 ENCOUNTER — Telehealth: Payer: Self-pay

## 2020-11-21 NOTE — Telephone Encounter (Signed)
Rec'd records from [facility] forwarded 4 pages to Dr. Deborra Medina

## 2020-11-23 DIAGNOSIS — R5383 Other fatigue: Secondary | ICD-10-CM | POA: Insufficient documentation

## 2020-11-23 DIAGNOSIS — R531 Weakness: Secondary | ICD-10-CM | POA: Insufficient documentation

## 2020-11-23 NOTE — Assessment & Plan Note (Addendum)
Recovered during 2014 colonoscopy.  Follow up (5 yr ) was deferred in 2019 due to multiple comobordities.  Will revisit at next follow up

## 2020-11-23 NOTE — Assessment & Plan Note (Signed)
Thyroid function has been  WNL on current dose of 75 mcg daily .  No current changes needed.   Lab Results  Component Value Date   TSH 0.742 10/10/2020

## 2020-11-23 NOTE — Assessment & Plan Note (Signed)
She requires mild assistance with stepping up onto the exam table  Due to left leg weakness, but is managing her ADLS without assistance

## 2020-11-23 NOTE — Assessment & Plan Note (Signed)
No changes to regimen today. Patient is taking her medications as prescribed and notes no adverse effects.

## 2020-11-23 NOTE — Assessment & Plan Note (Signed)
She is sleeping better and no longer taking mirtazipine or sertraline

## 2020-11-23 NOTE — Assessment & Plan Note (Signed)
Likely due to excessive exertion,  As her hemoglobin is stable and > 10.  All other screening tests reviewed and noncontributory

## 2020-11-23 NOTE — Assessment & Plan Note (Addendum)
With leukopenia, anemia and thrombocytopenia by recent CBC.  Felt to be secondary to lupus. Following up with hematology/ Oncology   Lab Results  Component Value Date   WBC 2.9 (L) 10/10/2020   HGB 10.1 (L) 10/10/2020   HCT 30.2 (L) 10/10/2020   MCV 102.7 (H) 10/10/2020   PLT 131 (L) 10/10/2020

## 2020-11-23 NOTE — Assessment & Plan Note (Signed)
Currently managed with MTX by Baxter Kail ,  [facility].  Previous intolerances to Plazquenil, azothioprine, Orudis.

## 2020-11-23 NOTE — Assessment & Plan Note (Signed)
Symptoms have not returned and she remains positive and optimistic due to her strong [place], which was shared today with me.  She has a strong support system

## 2020-11-23 NOTE — Assessment & Plan Note (Signed)
reassured that her brief episodes of vertigo while in bed turning head are due to BPV.

## 2020-11-27 ENCOUNTER — Other Ambulatory Visit: Payer: Self-pay | Admitting: *Deleted

## 2020-11-27 DIAGNOSIS — D72819 Decreased white blood cell count, unspecified: Secondary | ICD-10-CM

## 2020-11-27 DIAGNOSIS — D539 Nutritional anemia, unspecified: Secondary | ICD-10-CM

## 2020-11-27 DIAGNOSIS — Z170 Estrogen receptor positive status [ER+]: Secondary | ICD-10-CM

## 2020-11-27 DIAGNOSIS — C50912 Malignant neoplasm of unspecified site of left female breast: Secondary | ICD-10-CM

## 2020-11-27 NOTE — Progress Notes (Signed)
[facility]  [address], [address] [place], [place] [address] Phone: [phone]  Fax: [phone]   Clinic Day:  11/28/2020  Referring physician: [name], MD  Chief Complaint: Kim Santiago is a 86 y.o. female with stage IIB left breast cancer on letrozole who is seen for 7 week assessment.  HPI: The patient was last seen in the medical oncology clinic on 10/10/2020. At that time, she felt "good." She had intermittent upper extremity purpura. Balance had improved; she has not had any falls. Her joint pain had resolved.  She has a dry rash on her anterior neck. She was having another lupus outbreak.  Exam was stable.  Hematocrit was 30.2, hemoglobin 10.1, MCV 102.7, platelets 131,000, WBC 2,900 (ANC 1,600). Sodium was 127. Calcium was 8.6 and albumin 3.5. Ferritin 222. Vitamin B12 was 541 and folate 41.0. TSH was 0.742 and free T4 was 1.17 (0.61-1.12). CA27.29 was 26.4. She continued Femara, calcium, and vitamin D.  BMP on 10/24/2020 revealed a sodium of 132 and calcium 8.8.  During the interim, she has been "fine." She stopped methotrexate about a month ago. The rash on her neck/chest is stable. She thinks the rash may be due to the sun. She still bruises on her arms.  Otherwise, she feels that she is doing very well overall. She is taking a supplement that is improving her energy.   Past Medical History:  Diagnosis Date  . Arthritis   . Breast cancer ([place]) 2017   left mastectomy done 11/2015  . Breast cancer in female [facility]) 11/18/2015   Left: 3.9 cm tumor, T2, 1/2 sentinel nodes positive for macro metastatic disease, N1, 3 negative nodes in the axillary tail, ER+,PR+, Her 2 neu, low Mammoprint score  . Cancer [facility])    thyroid takes levothyroxine  . Chronic kidney disease    UTI  . Genetic screening March 2017.   Mammoprint of left breast cancer: Low risk for recurrence.   Marland Kitchen History of heart attack 06/12/2014  . Hypertension   . hypothyroidism     secondary to thyroidectomy for thyroid ca  . Hypothyroidism   . Lupus ([facility])    subcutaneous  . Menopause 40s   natural, hot flashes and mood lability now gone, off prempro 7 months  . Myocardial infarction ([place]) 2013  . Osteoporosis    Osteopenia  . Rosacea   . Sjoegren syndrome   . Stress-induced cardiomyopathy September of 2013   EF 35%. Peak troponin was 1.8.    Past Surgical History:  Procedure Laterality Date  . BACK SURGERY    . BREAST BIOPSY Left 10/30/15   positive, done in Dr. Dwyane Luo office  . CARDIAC CATHETERIZATION  05/2012   [facility]. No significant CAD. Ejection fraction of 35% due to stress-induced cardiomyopathy.  . CHOLECYSTECTOMY    . COLONOSCOPY    . DILATION AND CURETTAGE OF UTERUS    . KYPHOSIS SURGERY  Feb 2008   L1, Dr. Mauri Pole  . LUMBAR DISC SURGERY     L4-L5  . MASTECTOMY Left 11/18/2015   positive  . SENTINEL NODE BIOPSY Left 11/18/2015   Procedure: SENTINEL NODE BIOPSY;  Surgeon: Robert Bellow, MD;  Location: [facility] ORS;  Service: General;  Laterality: Left;  . SHOULDER ARTHROSCOPY  2004   Left, Dr. Jefm Bryant  . SIMPLE MASTECTOMY WITH AXILLARY SENTINEL NODE BIOPSY Left 11/18/2015   Procedure: SIMPLE MASTECTOMY;  Surgeon: Robert Bellow, MD;  Location: [facility] ORS;  Service: General;  Laterality:  Left;  . SPINE SURGERY     L4-5 diskectomy  . THYROIDECTOMY     Thyroid Cancer  . TONSILLECTOMY    . TUBAL LIGATION      Family History  Problem Relation Age of Onset  . COPD Father   . Cancer Father        esophageal  . Kidney disease Mother   . Heart disease Mother   . Kidney disease Sister   . Cancer Brother 6       colon cancer (both brothers)  . Heart attack Brother 11  . Heart disease Brother   . Breast cancer Neg Hx     Social History:  reports that she has never smoked. She has never used smokeless tobacco. She reports that she does not drink alcohol and does not use drugs. She is from [place], [place]. She was married for 7  years. She has an adopted daughter and 2 grandchildren.Her brother died of a MI following a cholecystectomy in 12/2015. The patient is living at her daughter's house since her CVA in 11/2018.  The patient is alone today  Allergies:  Allergies  Allergen Reactions  . Azathioprine Nausea And Vomiting    Severe vomiting  . Hydroxychloroquine Hives and Nausea And Vomiting  . Mycophenolate Mofetil Nausea Only  . Amoxicillin Rash    Has patient had a PCN reaction causing immediate rash, facial/tongue/throat swelling, SOB or lightheadedness with hypotension: Unknown Has patient had a PCN reaction causing severe rash involving mucus membranes or skin necrosis: Unknown Has patient had a PCN reaction that required hospitalization: Unknown Has patient had a PCN reaction occurring within the last 10 years: Unknown If all of the above answers are "NO", then may proceed with Cephalosporin use. Tolerates ceftriaxone and Ancef  . Codeine Nausea And Vomiting  . Naprosyn [Naproxen] Swelling  . Orudis [Ketoprofen] Hives  . Sulfa Antibiotics Rash  . Sulfathiazole Rash    Current Medications: Current Outpatient Medications  Medication Sig Dispense Refill  . atorvastatin (LIPITOR) 20 MG tablet TAKE 1 TABLET BY MOUTH DAILY AT 6PM 90 tablet 1  . Calcium Carbonate-Vitamin D 600-200 MG-UNIT TABS Take 2 tablets by mouth daily.    . cholecalciferol (VITAMIN D) 1000 UNITS tablet Take 2,000 Units by mouth daily.    Marland Kitchen CRANBERRY PO Take 1 capsule by mouth 2 (two) times daily.    . furosemide (LASIX) 20 MG tablet 1 TABLET EVERY OTHER DAY AS NEEDED 45 tablet 2  . letrozole (FEMARA) 2.5 MG tablet TAKE 1 TABLET BY MOUTH EVERY DAY 90 tablet 3  . levothyroxine (SYNTHROID) 75 MCG tablet TAKE 1 TABLET BY MOUTH (75MCG TOTAL) DAILY 90 tablet 2  . losartan (COZAAR) 25 MG tablet TAKE 1 TABLET BY MOUTH EVERY DAY 90 tablet 1  . metoprolol succinate (TOPROL-XL) 25 MG 24 hr tablet TAKE 1 TABLET BY MOUTH EVERY DAY 90 tablet 3  .  omeprazole (PRILOSEC) 20 MG capsule TAKE 1 CAPSULE BY MOUTH TWICE A DAY BEFORE A MEAL 180 capsule 0  . polyethylene glycol (MIRALAX / GLYCOLAX) packet Take 17 g by mouth daily.    . Probiotic Product (PROBIOTIC PO) Take 1 tablet by mouth daily.    . sodium fluoride (PREVIDENT 5000 PLUS) 1.1 % CREA dental cream Take 1 application by mouth daily.     . SODIUM FLUORIDE 5000 PPM 1.1 % PSTE Take by mouth.    . traZODone (DESYREL) 50 MG tablet Take 1 tablet (50 mg total) by mouth at bedtime.  90 tablet 1  . triamcinolone ointment (KENALOG) 0.1 % Apply 1 application topically daily.  0  . folic acid (FOLVITE) 1 MG tablet Take by mouth. (Patient not taking: No sig reported)    . methotrexate (RHEUMATREX) 2.5 MG tablet Take by mouth. (Patient not taking: No sig reported)    . nitrofurantoin (MACRODANTIN) 100 MG capsule Take 100 mg by mouth 2 (two) times daily. (Patient not taking: Reported on 11/28/2020)    . nitroGLYCERIN (NITROSTAT) 0.4 MG SL tablet Place 1 tablet (0.4 mg total) under the tongue every 5 (five) minutes as needed for chest pain. Maximum 3 doses (Patient not taking: Reported on 11/28/2020) 50 tablet 3  . ondansetron (ZOFRAN ODT) 4 MG disintegrating tablet Take 1 tablet (4 mg total) by mouth every 8 (eight) hours as needed for nausea or vomiting. (Patient not taking: No sig reported) 20 tablet 0  . tizanidine (ZANAFLEX) 2 MG capsule Take 1 capsule (2 mg total) by mouth 3 (three) times daily. (Patient not taking: No sig reported) 90 capsule 0   No current facility-administered medications for this visit.    Review of Systems  Constitutional: Positive for weight loss (down 2 lbs). Negative for chills, diaphoresis, fever and malaise/fatigue.       Feels "fine."  HENT: Positive for hearing loss. Negative for congestion, ear discharge, ear pain, nosebleeds, sinus pain, sore throat and tinnitus.        Xerostomia due to Sjogren's.  Eyes: Negative for blurred vision.  Respiratory: Negative for  cough, hemoptysis, sputum production and shortness of breath.   Cardiovascular: Negative for chest pain, palpitations and leg swelling.  Gastrointestinal: Negative for abdominal pain, blood in stool, constipation, diarrhea, heartburn, melena, nausea and vomiting.  Genitourinary: Negative for dysuria, frequency, hematuria and urgency.  Musculoskeletal: Negative for back pain, joint pain, myalgias and neck pain.  Skin: Positive for rash (dry, anterior neck). Negative for itching.  Neurological: Negative for dizziness, tingling, sensory change, weakness and headaches.  Endo/Heme/Allergies: Bruises/bleeds easily (purpura on arms).       HYPOthyroidism on levothyroxine.   Psychiatric/Behavioral: Negative for depression and memory loss. The patient is not nervous/anxious and does not have insomnia.   All other systems reviewed and are negative.  Performance status (ECOG):  1  Vitals Blood pressure (!) 149/80, pulse 73, temperature 97.7 F (36.5 C), temperature source Tympanic, resp. rate 18, weight 142 lb 8.4 oz (64.7 kg), SpO2 100 %.   Physical Exam Vitals and nursing note reviewed.  Constitutional:      General: She is not in acute distress.    Appearance: She is not diaphoretic.  Eyes:     General: No scleral icterus.    Conjunctiva/sclera: Conjunctivae normal.  Neurological:     Mental Status: She is alert and oriented to person, place, and time.  Psychiatric:        Behavior: Behavior normal.        Thought Content: Thought content normal.        Judgment: Judgment normal.    10/10/2020, anterior neck rash     Appointment on 11/28/2020  Component Date Value Ref Range Status  . WBC 11/28/2020 3.3* 4.0 - 10.5 K/uL Final  . RBC 11/28/2020 3.05* 3.87 - 5.11 MIL/uL Final  . Hemoglobin 11/28/2020 10.3* 12.0 - 15.0 g/dL Final  . HCT 11/28/2020 30.7* 36.0 - 46.0 % Final  . MCV 11/28/2020 100.7* 80.0 - 100.0 fL Final  . MCH 11/28/2020 33.8  26.0 - 34.0 pg Final  .  MCHC 11/28/2020  33.6  30.0 - 36.0 g/dL Final  . RDW 11/28/2020 15.4  11.5 - 15.5 % Final  . Platelets 11/28/2020 129* 150 - 400 K/uL Final  . nRBC 11/28/2020 0.0  0.0 - 0.2 % Final  . Neutrophils Relative % 11/28/2020 40  % Final  . Neutro Abs 11/28/2020 1.3* 1.7 - 7.7 K/uL Final  . Lymphocytes Relative 11/28/2020 39  % Final  . Lymphs Abs 11/28/2020 1.3  0.7 - 4.0 K/uL Final  . Monocytes Relative 11/28/2020 12  % Final  . Monocytes Absolute 11/28/2020 0.4  0.1 - 1.0 K/uL Final  . Eosinophils Relative 11/28/2020 6  % Final  . Eosinophils Absolute 11/28/2020 0.2  0.0 - 0.5 K/uL Final  . Basophils Relative 11/28/2020 2  % Final  . Basophils Absolute 11/28/2020 0.1  0.0 - 0.1 K/uL Final  . Immature Granulocytes 11/28/2020 1  % Final  . Abs Immature Granulocytes 11/28/2020 0.04  0.00 - 0.07 K/uL Final   Performed at [facility], [address] [place], [place] [address]    Assessment:  Kim Santiago is a 86 y.o. female  stage IIB left breast cancers/p mastectomy on 11/18/2015. Pathology revealed a 3.9 cm grade II invasive mammary carcinoma. There was ductal carcinoma in situ (DCIS). There was lymphvascular and dermal lymphatic invasion. Margins were negative. One of 2 sentinel lymph nodes were positive for macrometastasis. In addition, there were 3 benign axillary lymph nodes (total 5 lymph nodes). Tumor was estrogen receptor positive (100%), progesterone receptor positive (70%), and Her2/neu negative. Pathologic stage was T2N1a.   Bilateral mammogram and ultrasound on 11/11/2015 revealed a 3 cm left breast mass at the 6 o'clock position (1 cm from the nipple) and a 1.6 cm irregular hypoechoicleft breast mass at 1 o'clock position (5 cm from the nipple) with overlying skin thickening. Right mammogramon 11/18/2016 revealed no evidence of malignancy. Right mammogramon 11/23/2017 revealed no evidence of malignancy.  Right mammogram on 02/28/2019 revealed no evidence of malignancy.   Screening right mammogram on 02/29/2020 revealed no evidence of malignancy.  MammaPrinton 12/18/2015 revealed a low risk of recurrence with a 5% risk of distant recurrence of 5 years and a 10% recurrence at 10 years.  She declined radiation. She began [facility] 01/09/2016. She has a little joint pain and a few hot flashes.  PET scanon 12/23/2016 revealed slightly enlarged right lower paratracheal lymph node and some clustered small AP window lymph nodes with metabolic activity very minimally above background in the mediastinum. There were no other findings of hypermetabolic lesions in the neck, chest, abdomen, or pelvis. Chest CTon 03/16/2017 revealed stable mild mediastinal lymphadenopathy. Largest node was 1.0 cm in the right paratracheal region. There was no new or progressive disease within thorax. Chest CTon 09/23/2017 revealed stable mild mediastinal adenopathy (1.1 cm precarinal and 0.9 cm pretracheal lymph node). There was no new or progressive disease. Chest CTon 09/26/2018 revealed a stable exam.There was no change in the previous described mediastinal lymph nodes.There were stable tiny right lung nodules consistent with benign etiology.  Chest CT on 09/28/2019 revealed no acute cardiopulmonary disease.  There was subtle chronic stable bibasilar interstitial prominence.  There were a couple of tiny stable right lung nodules with the larger measuring 4 mm unchanged for 2 years and therefore considered benign.  There was a stable 1.1 cm precarinal lymph node likely reactive.  There was mild anterior wedging of 2 adjacent midthoracic vertebral bodies unchanged and previous kyphoplasty in the region  of the thoracolumbar junction, unchanged.  CA27.29 has been followed: 30.4 on 11/15/2015, 32.5 on 05/13/2016, 19.0 on 08/12/2016, 40.8 on 11/11/2016, 45.8 on 12/14/2016, 31.6 on 03/17/2017, 45.7 on 07/14/2017, 43.4 on 12/08/2017, 33.2 on 04/27/2018, 31.3 on 08/24/2018, 43.1 on  03/01/2019, 35.2 on 04/03/2019, 34.0 on 05/31/2019, 29.1 on 09/05/2019, 25.0 on 04/09/2020, and 26.4 on 10/10/2020.  Bone density on 12/05/2015 revealed osteopeniawith a T-score of -1.5 in the left femoral neck. Bone density on 12/07/2017 revealed osteopenia with a T-score of -1.6 in the right femoral neck. Bone density on 04/23/2020 revealed osteopenia with a T score of -1.6 at the right femur neck.  She is on calcium and vitamin D.  She has a history of a myocardial infarctionin 06/2012. Echocardiogram revealed an EF of 35%. Follow-up echo on 09/21/2015 revealed an EF of 55-60%. Patient is unaware of a history of heart failure. She has a history of thyroid carcinoma40 years ago treated with I-131.  She has a mild normocytic anemia. Diet is good. Colonoscopyon 07/25/2013 revealed 3 small polyps (89m proximal descending colon, 3 mm splenic flexure, 1 mm ascending colon). She denies any melena or hematochezia. TSH was normal on 12/26/2015 and 08/24/2018.Copper was normal on 08/24/2018.  She has cutaneous lupusand Sjogren's. She had a reaction to Plaquenil. She completed an extended steroid taper. She is on methotrexate8 tablets every Wednesday (increased 3-4 weeks ago).  [facility]. She presented with left sided focal numbness and weakness. Head MRI and MRAwithout contrast revealed an acute versus subacute faint right almost nonhemorrhagic infarct. There was moderate to severe chronic small vessel ischemic changes. There was no large vesselflow-limiting stenosis. Bilateralcarotid ultrasound revealed<50% stenosis in the right and left internal carotid arteries  Symptomatically, she has been "fine." She stopped methotrexate about a month ago. The rash on her neck/chest is stable. She still bruises on her arms.    Plan: 1.   Labs today: CBC with diff, copper. 2.Stage IIB left breast cancer Clinically,  she is  doing well Exam on 10/10/2020 revealed no evidence of recurrent disease. [place] mammogram on 02/29/2020 feels no evidence of recurrent disease. CA 27.29 was 26.4 (normal) on 10/10/2020.   She is on Femara (began 01/09/2016).  Patient declined BCI testing. Anticipate discontinuation of Femara on 01/08/2021. 3.Leukopenia WBC 2900 with an AGurdonof 1600 on 10/10/2020. WBC 3300 with an AOelweinof 1300 on 11/28/2020. Etiology felt likely secondary to lupus and methotrexate.  Counts have improved since discontinuation of MTX 1 month ago. TSH normal today.  Copper 98 (normal) on 11/28/2020. B12 and folate normal on 10/10/2020. Flow cytometry on04/13/2020 revealed noabnormality.  Continue to monitor. 4.Anemia  Hematocrit 30.2.  Hemoglobin 10.1.  MCV 102.7 on 10/10/2020. Hematocrit 30.7.  Hemoglobin 10.7.  MCV 100.7 on 11/28/2020. B12 and folate normal on 10/10/2020. Ferritin 222 on 10/10/2020. Suspect anemia of chronic disease. Patient with macrocytic indices- consider bone marrow depending on follow-up blood counts. 5.Mediastinal adenopathy Chest CT on 09/26/2018 revealed stable mediastinal adenopathy. Chest CT on 09/28/2019 revealed subtle chronic stable bibasilar interstitial prominence.    There were a couple of tiny stable right lung nodules with the largest 4 mm unchanged for 2 years (benign).    There was a stable 1.1 cm precarinal lymph node, likely reactive.    There was mild anterior wedging of 2 adjacent midthoracic vertebral bodies unchanged. No current plan for reimaging. 6.Osteopenia  Bone density on 04/23/2020 revealed a T score of -1.6 at the  right femur neck.    She is on calcium and vitamin D. Calcium 8.8 (corrected 8.9). Continue calcium and vitamin D. Patient's dentist declines approval for Prolia. Continue to monitor bone density every 2 years. 7.   RTC after mammogram on 02/28/2021 for MD assessment, including breast exam, labs (CBC with diff, CMP,  CA27.29, ferritin, iron studies, sed rate).  I discussed the assessment and treatment plan with the patient.  The patient was provided an opportunity to ask questions and all were answered.  The patient agreed with the plan and demonstrated an understanding of the instructions.  The patient was advised to call back if the symptoms worsen or if the condition fails to improve as anticipated.  I provided 17 minutes of face-to-face time during this this encounter and > 50% was spent counseling as documented under my assessment and plan.  An additional 5 minutes were spent reviewing her chart (Epic and Care Everywhere) including notes, labs, and imaging studies.    Lequita Asal, MD, PhD 11/28/2020, 3:52 PM  I, Evert Kohl, am acting as a Education administrator for Lequita Asal, MD.  I, [place] Mike Gip, MD, have reviewed the above documentation for accuracy and completeness, and I agree with the above.

## 2020-11-28 ENCOUNTER — Inpatient Hospital Stay: Payer: PPO | Attending: Hematology and Oncology

## 2020-11-28 ENCOUNTER — Encounter: Payer: Self-pay | Admitting: Hematology and Oncology

## 2020-11-28 ENCOUNTER — Inpatient Hospital Stay (HOSPITAL_BASED_OUTPATIENT_CLINIC_OR_DEPARTMENT_OTHER): Payer: PPO | Admitting: Hematology and Oncology

## 2020-11-28 ENCOUNTER — Other Ambulatory Visit: Payer: Self-pay

## 2020-11-28 VITALS — BP 149/80 | HR 73 | Temp 97.7°F | Resp 18 | Wt 142.5 lb

## 2020-11-28 DIAGNOSIS — Z170 Estrogen receptor positive status [ER+]: Secondary | ICD-10-CM

## 2020-11-28 DIAGNOSIS — R590 Localized enlarged lymph nodes: Secondary | ICD-10-CM | POA: Diagnosis not present

## 2020-11-28 DIAGNOSIS — C50912 Malignant neoplasm of unspecified site of left female breast: Secondary | ICD-10-CM

## 2020-11-28 DIAGNOSIS — D649 Anemia, unspecified: Secondary | ICD-10-CM | POA: Diagnosis not present

## 2020-11-28 DIAGNOSIS — Z79811 Long term (current) use of aromatase inhibitors: Secondary | ICD-10-CM | POA: Insufficient documentation

## 2020-11-28 DIAGNOSIS — D539 Nutritional anemia, unspecified: Secondary | ICD-10-CM

## 2020-11-28 DIAGNOSIS — D72819 Decreased white blood cell count, unspecified: Secondary | ICD-10-CM

## 2020-11-28 DIAGNOSIS — Z9012 Acquired absence of left breast and nipple: Secondary | ICD-10-CM | POA: Insufficient documentation

## 2020-11-28 DIAGNOSIS — C50412 Malignant neoplasm of upper-outer quadrant of left female breast: Secondary | ICD-10-CM | POA: Insufficient documentation

## 2020-11-28 DIAGNOSIS — M85851 Other specified disorders of bone density and structure, right thigh: Secondary | ICD-10-CM

## 2020-11-28 LAB — CBC WITH DIFFERENTIAL/PLATELET
Abs Immature Granulocytes: 0.04 10*3/uL (ref 0.00–0.07)
Basophils Absolute: 0.1 10*3/uL (ref 0.0–0.1)
Basophils Relative: 2 %
Eosinophils Absolute: 0.2 10*3/uL (ref 0.0–0.5)
Eosinophils Relative: 6 %
HCT: 30.7 % — ABNORMAL LOW (ref 36.0–46.0)
Hemoglobin: 10.3 g/dL — ABNORMAL LOW (ref 12.0–15.0)
Immature Granulocytes: 1 %
Lymphocytes Relative: 39 %
Lymphs Abs: 1.3 10*3/uL (ref 0.7–4.0)
MCH: 33.8 pg (ref 26.0–34.0)
MCHC: 33.6 g/dL (ref 30.0–36.0)
MCV: 100.7 fL — ABNORMAL HIGH (ref 80.0–100.0)
Monocytes Absolute: 0.4 10*3/uL (ref 0.1–1.0)
Monocytes Relative: 12 %
Neutro Abs: 1.3 10*3/uL — ABNORMAL LOW (ref 1.7–7.7)
Neutrophils Relative %: 40 %
Platelets: 129 10*3/uL — ABNORMAL LOW (ref 150–400)
RBC: 3.05 MIL/uL — ABNORMAL LOW (ref 3.87–5.11)
RDW: 15.4 % (ref 11.5–15.5)
WBC: 3.3 10*3/uL — ABNORMAL LOW (ref 4.0–10.5)
nRBC: 0 % (ref 0.0–0.2)

## 2020-11-28 NOTE — Progress Notes (Signed)
Patient here for oncology follow-up appointment, expresses concerns of frequent bruising

## 2020-12-03 LAB — COPPER, SERUM: Copper: 98 ug/dL (ref 80–158)

## 2020-12-09 DIAGNOSIS — I69354 Hemiplegia and hemiparesis following cerebral infarction affecting left non-dominant side: Secondary | ICD-10-CM | POA: Diagnosis not present

## 2020-12-09 DIAGNOSIS — E261 Secondary hyperaldosteronism: Secondary | ICD-10-CM | POA: Diagnosis not present

## 2020-12-09 DIAGNOSIS — M3500 Sicca syndrome, unspecified: Secondary | ICD-10-CM | POA: Diagnosis not present

## 2020-12-09 DIAGNOSIS — C50912 Malignant neoplasm of unspecified site of left female breast: Secondary | ICD-10-CM | POA: Diagnosis not present

## 2020-12-09 DIAGNOSIS — F3340 Major depressive disorder, recurrent, in remission, unspecified: Secondary | ICD-10-CM | POA: Diagnosis not present

## 2020-12-09 DIAGNOSIS — E1151 Type 2 diabetes mellitus with diabetic peripheral angiopathy without gangrene: Secondary | ICD-10-CM | POA: Diagnosis not present

## 2020-12-09 DIAGNOSIS — I509 Heart failure, unspecified: Secondary | ICD-10-CM | POA: Diagnosis not present

## 2020-12-09 DIAGNOSIS — D709 Neutropenia, unspecified: Secondary | ICD-10-CM | POA: Diagnosis not present

## 2020-12-11 DIAGNOSIS — Z961 Presence of intraocular lens: Secondary | ICD-10-CM | POA: Diagnosis not present

## 2020-12-11 DIAGNOSIS — H52223 Regular astigmatism, bilateral: Secondary | ICD-10-CM | POA: Diagnosis not present

## 2020-12-11 DIAGNOSIS — H524 Presbyopia: Secondary | ICD-10-CM | POA: Diagnosis not present

## 2020-12-11 DIAGNOSIS — H5203 Hypermetropia, bilateral: Secondary | ICD-10-CM | POA: Diagnosis not present

## 2020-12-11 DIAGNOSIS — M3500 Sicca syndrome, unspecified: Secondary | ICD-10-CM | POA: Diagnosis not present

## 2020-12-31 DIAGNOSIS — M9903 Segmental and somatic dysfunction of lumbar region: Secondary | ICD-10-CM | POA: Diagnosis not present

## 2020-12-31 DIAGNOSIS — M546 Pain in thoracic spine: Secondary | ICD-10-CM | POA: Diagnosis not present

## 2020-12-31 DIAGNOSIS — M9902 Segmental and somatic dysfunction of thoracic region: Secondary | ICD-10-CM | POA: Diagnosis not present

## 2021-01-06 ENCOUNTER — Telehealth: Payer: Self-pay | Admitting: Oncology

## 2021-01-06 NOTE — Telephone Encounter (Signed)
Patient called and states that she would like to cancel her future appointments as she was supposed to be finished with seeing Dr. Mike Gip in May.  She will get Dr. Derrel Nip to manage her mammograms moving forward.  I informed patient that the upcoming mammogram order was entered by Dr. Mike Gip and she will need to discuss that with Dr. Derrel Nip as the results will come directly to Dr. Mike Gip if that order is not changed.  She states understanding.

## 2021-01-15 ENCOUNTER — Other Ambulatory Visit: Payer: Self-pay | Admitting: Internal Medicine

## 2021-01-17 DIAGNOSIS — L986 Other infiltrative disorders of the skin and subcutaneous tissue: Secondary | ICD-10-CM | POA: Diagnosis not present

## 2021-01-17 DIAGNOSIS — Z79899 Other long term (current) drug therapy: Secondary | ICD-10-CM | POA: Diagnosis not present

## 2021-01-17 DIAGNOSIS — D692 Other nonthrombocytopenic purpura: Secondary | ICD-10-CM | POA: Diagnosis not present

## 2021-01-17 DIAGNOSIS — L932 Other local lupus erythematosus: Secondary | ICD-10-CM | POA: Diagnosis not present

## 2021-02-13 ENCOUNTER — Telehealth: Payer: Self-pay

## 2021-02-13 DIAGNOSIS — Z79899 Other long term (current) drug therapy: Secondary | ICD-10-CM

## 2021-02-13 NOTE — Telephone Encounter (Signed)
Received a fax from [facility] requesting that pt have some labs done in our office at pt's next appt. Dr. Derrel Nip gave a verbal okay to order and have done in our office. Labs have been ordered.

## 2021-02-24 ENCOUNTER — Ambulatory Visit: Payer: PPO | Admitting: Internal Medicine

## 2021-02-25 ENCOUNTER — Ambulatory Visit (INDEPENDENT_AMBULATORY_CARE_PROVIDER_SITE_OTHER): Payer: PPO | Admitting: Internal Medicine

## 2021-02-25 ENCOUNTER — Encounter: Payer: Self-pay | Admitting: Internal Medicine

## 2021-02-25 ENCOUNTER — Other Ambulatory Visit: Payer: Self-pay

## 2021-02-25 VITALS — BP 142/72 | HR 82 | Temp 96.4°F | Resp 15 | Ht 63.0 in | Wt 142.0 lb

## 2021-02-25 DIAGNOSIS — L930 Discoid lupus erythematosus: Secondary | ICD-10-CM

## 2021-02-25 DIAGNOSIS — E032 Hypothyroidism due to medicaments and other exogenous substances: Secondary | ICD-10-CM | POA: Diagnosis not present

## 2021-02-25 DIAGNOSIS — Z79899 Other long term (current) drug therapy: Secondary | ICD-10-CM | POA: Diagnosis not present

## 2021-02-25 DIAGNOSIS — E034 Atrophy of thyroid (acquired): Secondary | ICD-10-CM | POA: Diagnosis not present

## 2021-02-25 DIAGNOSIS — Z853 Personal history of malignant neoplasm of breast: Secondary | ICD-10-CM

## 2021-02-25 DIAGNOSIS — I10 Essential (primary) hypertension: Secondary | ICD-10-CM | POA: Diagnosis not present

## 2021-02-25 DIAGNOSIS — F331 Major depressive disorder, recurrent, moderate: Secondary | ICD-10-CM | POA: Diagnosis not present

## 2021-02-25 MED ORDER — OMEPRAZOLE 20 MG PO CPDR: 20.0000 mg | DELAYED_RELEASE_CAPSULE | Freq: Two times a day (BID) | ORAL | 1 refills | Status: DC

## 2021-02-25 NOTE — Progress Notes (Signed)
Subjective:  Patient ID: [name], female    DOB: Oct 11, 1934  Age: 86 y.o. MRN: [id number]  CC: The primary encounter diagnosis was Discoid lupus. Diagnoses of Hypothyroidism due to acquired atrophy of thyroid, History of breast cancer, Long-term use of high-risk medication, Essential hypertension, Iatrogenic hypothyroidism, and Major depressive disorder, recurrent episode, moderate ([place]) were also pertinent to this visit.  HPI [name] presents for  follow up o n hypertension insomnia and hyperlipidemia   This visit occurred during the SARS-CoV-2 public health emergency.  Safety protocols were in place, including screening questions prior to the visit, additional usage of staff PPE, and extensive cleaning of exam room while observing appropriate contact time as indicated for disinfecting solutions.   She has been having a lupus flare and her skin and fingernails have become tender . Reviewed last visit with Dr Will Bonnet [facility]) Punch biopsy of toe done to rule out vasculitis.   Lupus confirmed,  no vasculitis.   Methotrexate was started in May (see below)   Cutaneous lupus, chronic illness with exacerbation, progression or side effects of treatment: -Flaring over the bilateral dorsal hands, forearms, upper back, upper chest and lower extremities along with violaceous erythema over the plantar surfaces of the toes -Diagnosis, treatment options, prognosis, risk/ benefit, and side effects were discussed with the patient  -I do suspect this is a flare of her cutaneous lupus, but the changes on the plantar surfaces of the toes/feet are concerning for vasculitis -Patient agreeable to biopsy for diagnostic clarification; see biopsy procedure note below -Patient medication:Restart methotrexate 5 mg weekly with folic acid supplementation of 1 mg on nonmethotrexate days; refill sent  -Continue triamcinolone to active cutaneous lesions -Continue strict sun avoidance and sun  protection as radiation recall may be possible    Patient is scheduled for mammogram ordered by Dr Mike Gip who is no longer with [facility].  Patient prefers to follow up with me instead of a new oncologist. .  Will need  breast exam, labs (CBC with diff, CMP, CA27.29, ferritin, iron studies, sed rate).  She finished 5 years of adjuvant therapy with letrozole which was to be stopped in June   Cc:  sleep issues,  better with use of tylenol PM. Had hallucinations with trazodone   Hypertension: patient checks blood pressure twice weekly at home.  Readings have been for the most part < 130/80 at rest . Patient is following a reduced salt diet most days and is taking medications as prescribed    Outpatient Medications Prior to Visit  Medication Sig Dispense Refill   atorvastatin (LIPITOR) 20 MG tablet TAKE 1 TABLET BY MOUTH DAILY AT 6PM 90 tablet 1   Calcium Carbonate-Vitamin D 600-200 MG-UNIT TABS Take 2 tablets by mouth daily.     cholecalciferol (VITAMIN D) 1000 UNITS tablet Take 2,000 Units by mouth daily.     CRANBERRY PO Take 1 capsule by mouth 2 (two) times daily.     folic acid (FOLVITE) 1 MG tablet Take by mouth.     furosemide (LASIX) 20 MG tablet 1 TABLET EVERY OTHER DAY AS NEEDED 45 tablet 2   levothyroxine (SYNTHROID) 75 MCG tablet TAKE 1 TABLET BY MOUTH (75MCG TOTAL) DAILY 90 tablet 2   losartan (COZAAR) 25 MG tablet TAKE 1 TABLET BY MOUTH EVERY DAY 90 tablet 1   methotrexate (RHEUMATREX) 2.5 MG tablet Take by mouth.     metoprolol succinate (TOPROL-XL) 25 MG 24 hr tablet TAKE 1 TABLET BY MOUTH EVERY  DAY 90 tablet 3   nitroGLYCERIN (NITROSTAT) 0.4 MG SL tablet Place 1 tablet (0.4 mg total) under the tongue every 5 (five) minutes as needed for chest pain. Maximum 3 doses 50 tablet 3   polyethylene glycol (MIRALAX / GLYCOLAX) packet Take 17 g by mouth daily.     Probiotic Product (PROBIOTIC PO) Take 1 tablet by mouth daily.     sodium fluoride (PREVIDENT 5000 PLUS) 1.1 % CREA dental cream  Take 1 application by mouth daily.      SODIUM FLUORIDE 5000 PPM 1.1 % PSTE Take by mouth.     triamcinolone ointment (KENALOG) 0.1 % Apply 1 application topically daily.  0   omeprazole (PRILOSEC) 20 MG capsule TAKE 1 CAPSULE BY MOUTH TWICE A DAY BEFORE A MEAL 180 capsule 0   traZODone (DESYREL) 50 MG tablet TAKE 1 TABLET BY MOUTH EVERYDAY AT BEDTIME 90 tablet 1   letrozole (FEMARA) 2.5 MG tablet TAKE 1 TABLET BY MOUTH EVERY DAY (Patient not taking: Reported on 02/25/2021) 90 tablet 3   ondansetron (ZOFRAN ODT) 4 MG disintegrating tablet Take 1 tablet (4 mg total) by mouth every 8 (eight) hours as needed for nausea or vomiting. (Patient not taking: No sig reported) 20 tablet 0   tizanidine (ZANAFLEX) 2 MG capsule Take 1 capsule (2 mg total) by mouth 3 (three) times daily. (Patient not taking: No sig reported) 90 capsule 0   nitrofurantoin (MACRODANTIN) 100 MG capsule Take 100 mg by mouth 2 (two) times daily. (Patient not taking: No sig reported)     No facility-administered medications prior to visit.    Review of Systems;  Patient denies headache, fevers, malaise, unintentional weight loss, skin rash, eye pain, sinus congestion and sinus pain, sore throat, dysphagia,  hemoptysis , cough, dyspnea, wheezing, chest pain, palpitations, orthopnea, edema, abdominal pain, nausea, melena, diarrhea, constipation, flank pain, dysuria, hematuria, urinary  Frequency, nocturia, numbness, tingling, seizures,  Focal weakness, Loss of consciousness,  Tremor, insomnia, depression, anxiety, and suicidal ideation.      Objective:  BP (!) 142/72 (BP Location: Left Arm, Patient Position: Sitting, Cuff Size: Normal)   Pulse 82   Temp (!) 96.4 F (35.8 C) (Temporal)   Resp 15   Ht 5\' 3"  (1.6 m)   Wt 142 lb (64.4 kg)   SpO2 98%   BMI 25.15 kg/m   BP Readings from Last 3 Encounters:  02/25/21 (!) 142/72  11/28/20 (!) 149/80  11/20/20 (!) 148/82    Wt Readings from Last 3 Encounters:  02/25/21 142 lb  (64.4 kg)  11/28/20 142 lb 8.4 oz (64.7 kg)  11/20/20 141 lb 12.8 oz (64.3 kg)    General appearance: alert, cooperative and appears stated age Ears: normal TM's and external ear canals both ears Throat: lips, mucosa, and tongue normal; teeth and gums normal Neck: no adenopathy, no carotid bruit, supple, symmetrical, trachea midline and thyroid not enlarged, symmetric, no tenderness/mass/nodules Back: symmetric, no curvature. ROM normal. No CVA tenderness. Lungs: clear to auscultation bilaterally Heart: regular rate and rhythm, S1, S2 normal, no murmur, click, rub or gallop Abdomen: soft, non-tender; bowel sounds normal; no masses,  no organomegaly Pulses: 2+ and symmetric Skin: Skin color, texture, turgor normal. No rashes or lesions Lymph nodes: Cervical, supraclavicular, and axillary nodes normal.  Lab Results  Component Value Date   HGBA1C 4.9 11/08/2018    Lab Results  Component Value Date   CREATININE 0.78 02/25/2021   CREATININE 0.72 10/24/2020   CREATININE 0.76 10/10/2020  Lab Results  Component Value Date   WBC 3.0 (L) 02/25/2021   HGB 11.0 (L) 02/25/2021   HCT 31.0 (L) 02/25/2021   PLT 146.0 (L) 02/25/2021   GLUCOSE 95 02/25/2021   CHOL 92 07/23/2020   TRIG 110.0 07/23/2020   HDL 31.40 (L) 07/23/2020   LDLDIRECT 117.6 12/27/2012   LDLCALC 39 07/23/2020   ALT 6 02/25/2021   AST 17 02/25/2021   NA 133 (L) 02/25/2021   K 5.0 02/25/2021   CL 99 02/25/2021   CREATININE 0.78 02/25/2021   BUN 20 02/25/2021   CO2 29 02/25/2021   TSH 0.742 10/10/2020   INR 1.0 10/10/2019   HGBA1C 4.9 11/08/2018    DG Bone Density  Result Date: 04/23/2020 EXAM: DUAL X-RAY ABSORPTIOMETRY (DXA) FOR BONE MINERAL DENSITY IMPRESSION: crr Your patient Alaiya Martindelcampo completed a BMD test on 04/23/2020 using the [place] (analysis version: 14.10) manufactured by [employer]. The following summarizes the results of our evaluation. PATIENT BIOGRAPHICAL: Name:  Jaquelyn, Sakamoto Patient ID: [id number] Birth Date: 1935/03/16 Height: 63.0 in. Gender: Female Exam Date: 04/23/2020 Weight: 138.8 lbs. Indications: Advanced Age, Caucasian, Early Menopause, Height Loss, high risk meds, hx breast ca, hx thyroid ca, lupus, osteopenia, POSTmenopausal Fractures: Treatments: calcium /vit d, letrozole, levothyroxine ASSESSMENT: The BMD measured at Femur Neck Right is 0.811 g/cm2 with a T-score of -1.6. This patient is considered osteopenic according to [place] [facility]) criteria. L-2 was excluded due to degenerative changes. The scan quality is limited by exclusion of L-2. Site Region Measured Measured WHO Young Adult BMD Date       Age      Classification T-score AP Spine L1-L4 (L2) 6/23/2020 83.3 Normal -0.4 1.124 g/cm2 AP Spine L1-L4 (L2) 12/07/2017 80.9 Normal -0.3 1.134 g/cm2 AP Spine L1-L4 (L2) 12/05/2015 78.9 Normal -0.3 1.134 g/cm2 DualFemur Neck Right 04/23/2020 83.3 Osteopenia -1.6 0.811 g/cm2 DualFemur Neck Right 12/07/2017 80.9 Osteopenia -1.6 0.813 g/cm2 DualFemur Neck Right 12/05/2015 78.9 Osteopenia -1.5 0.829 g/cm2 DualFemur Total Mean 04/23/2020 83.3 Osteopenia -1.2 0.857 g/cm2 DualFemur Total Mean 12/07/2017 80.9 Osteopenia -1.1 0.872 g/cm2 DualFemur Total Mean 12/05/2015 78.9 Osteopenia -1.1 0.867 g/cm2 World Health Organization [facility]) criteria for post-menopausal, Caucasian Women: Normal:       T-score at or above -1 SD Osteopenia:   T-score between -1 and -2.5 SD Osteoporosis: T-score at or below -2.5 SD RECOMMENDATIONS: 1. All patients should optimize calcium and vitamin D intake. 2. Consider FDA-approved medical therapies in postmenopausal women and men aged 38 years and older, based on the following: a. A hip or vertebral (clinical or morphometric) fracture b. T-score < -2.5 at the femoral neck or spine after appropriate evaluation to exclude secondary causes c. Low bone mass (T-score between -1.0 and -2.5 at the femoral neck or spine) and a 10-year  probability of a hip fracture > 3% or a 10-year probability of a major osteoporosis-related fracture > 20% based on the [place]-adapted WHO algorithm d. Clinician judgment and/or patient preferences may indicate treatment for people with 10-year fracture probabilities above or below these levels FOLLOW-UP: People with diagnosed cases of osteoporosis or at high risk for fracture should have regular bone mineral density tests. For patients eligible for Medicare, routine testing is allowed once every 2 years. The testing frequency can be increased to one year for patients who have rapidly progressing disease, those who are receiving or discontinuing medical therapy to restore bone mass, or have additional risk factors. I have reviewed this report,  and agree with the above findings. [facility] crr Your patient MARRA FRAGA completed a FRAX assessment on 04/23/2020 using the [place] (analysis version: 14.10) manufactured by [employer]. The following summarizes the results of our evaluation. PATIENT BIOGRAPHICAL: Name: Natika, Geyer Patient ID: [id number] Birth Date: 1935/05/29 Height:    63.0 in. Gender:     [age]    [age]       Weight:    138.8 lbs. Ethnicity:  White                            Exam Date: 04/23/2020 FRAX* RESULTS:  (version: 3.5) 10-year Probability of Fracture1 Major Osteoporotic Fracture2 Hip Fracture 14.2% 4.0% Population: [place] (Caucasian) Risk Factors: None Based on Femur (Right) Neck BMD 1 -The 10-year probability of fracture may be lower than reported if the patient has received treatment. 2 -Major Osteoporotic Fracture: Clinical Spine, Forearm, Hip or Shoulder *FRAX is a Materials engineer of the [employer] for Metabolic Bone Disease, a [place] (WHO) [facility]. ASSESSMENT: The probability of a major osteoporotic fracture is 14.2% within the next ten years. The probability of a hip fracture  is 4.0% within the next ten years. Electronically Signed   By: Lowella Grip III M.D.   On: 04/23/2020 11:49    Assessment & Plan:   Problem List Items Addressed This Visit       Unprioritized   Discoid lupus - Primary    confirmed with repeat biopsy.  She has resumed methotrexate and will need surveillance labs every 6 weeks       Relevant Orders   Iron, TIBC and Ferritin Panel (Completed)   Sedimentation rate (Completed)   Essential hypertension    Well controlled No changes to regimen today. Patient is taking her medications as prescribed and notes no adverse effects.         Hypothyroidism due to acquired atrophy of thyroid   Iatrogenic hypothyroidism    Thyroid function has been  WNL on current dose of 75 mcg daily .  No current changes needed.   Lab Results  Component Value Date   TSH 0.742 10/10/2020          Major depressive disorder, recurrent episode, moderate (HCC)    Symptoms have not returned and she remains positive and optimistic due to her strong [place] faith, which was shared today with me.  She has a strong support system        Other Visit Diagnoses     History of breast cancer       Relevant Orders   Cancer antigen 27.29 (Completed)   Long-term use of high-risk medication       Relevant Orders   Comprehensive metabolic panel (Completed)   CBC with Differential/Platelet (Completed)       I have discontinued Exa R. Schlegel "Emma"'s nitrofurantoin and traZODone. I have also changed her omeprazole. Additionally, I am having her maintain her cholecalciferol, Calcium Carbonate-Vitamin D, polyethylene glycol, tizanidine, ondansetron, triamcinolone ointment, Probiotic Product (PROBIOTIC PO), nitroGLYCERIN, CRANBERRY PO, sodium fluoride, metoprolol succinate, levothyroxine, letrozole, folic acid, methotrexate, atorvastatin, Sodium Fluoride 5000 PPM, furosemide, and losartan.  Meds ordered this encounter  Medications   omeprazole (PRILOSEC)  20 MG capsule    Sig: Take 1 capsule (20 mg total) by mouth 2 (two) times daily before a meal.  Dispense:  180 capsule    Refill:  1    Medications Discontinued During This Encounter  Medication Reason   nitrofurantoin (MACRODANTIN) 100 MG capsule    omeprazole (PRILOSEC) 20 MG capsule Reorder   traZODone (DESYREL) 50 MG tablet     Follow-up: Return in about 6 months (around 08/27/2021).   [name], MD

## 2021-02-26 LAB — CBC WITH DIFFERENTIAL/PLATELET
Basophils Absolute: 0 10*3/uL (ref 0.0–0.1)
Basophils Relative: 1.1 % (ref 0.0–3.0)
Eosinophils Absolute: 0.2 10*3/uL (ref 0.0–0.7)
Eosinophils Relative: 6.5 % — ABNORMAL HIGH (ref 0.0–5.0)
HCT: 31 % — ABNORMAL LOW (ref 36.0–46.0)
Hemoglobin: 11 g/dL — ABNORMAL LOW (ref 12.0–15.0)
Lymphocytes Relative: 34 % (ref 12.0–46.0)
Lymphs Abs: 1 10*3/uL (ref 0.7–4.0)
MCHC: 35.4 g/dL (ref 30.0–36.0)
MCV: 99.3 fl (ref 78.0–100.0)
Monocytes Absolute: 0.2 10*3/uL (ref 0.1–1.0)
Monocytes Relative: 8.2 % (ref 3.0–12.0)
Neutro Abs: 1.5 10*3/uL (ref 1.4–7.7)
Neutrophils Relative %: 50.2 % (ref 43.0–77.0)
Platelets: 146 10*3/uL — ABNORMAL LOW (ref 150.0–400.0)
RBC: 3.12 Mil/uL — ABNORMAL LOW (ref 3.87–5.11)
RDW: 17.2 % — ABNORMAL HIGH (ref 11.5–15.5)
WBC: 3 10*3/uL — ABNORMAL LOW (ref 4.0–10.5)

## 2021-02-26 LAB — COMPREHENSIVE METABOLIC PANEL
ALT: 6 U/L (ref 0–35)
AST: 17 U/L (ref 0–37)
Albumin: 4 g/dL (ref 3.5–5.2)
Alkaline Phosphatase: 78 U/L (ref 39–117)
BUN: 20 mg/dL (ref 6–23)
CO2: 29 mEq/L (ref 19–32)
Calcium: 9.2 mg/dL (ref 8.4–10.5)
Chloride: 99 mEq/L (ref 96–112)
Creatinine, Ser: 0.78 mg/dL (ref 0.40–1.20)
GFR: 69.88 mL/min (ref >60.00)
Glucose, Bld: 95 mg/dL (ref 70–99)
Potassium: 5 mEq/L (ref 3.5–5.1)
Sodium: 133 mEq/L — ABNORMAL LOW (ref 135–145)
Total Bilirubin: 0.6 mg/dL (ref 0.2–1.2)
Total Protein: 6.7 g/dL (ref 6.0–8.3)

## 2021-02-26 LAB — CANCER ANTIGEN 27.29: CA 27.29: 32 U/mL (ref <38)

## 2021-02-26 LAB — IRON,TIBC AND FERRITIN PANEL
%SAT: 27 % (calc) (ref 16–45)
Ferritin: 252 ng/mL (ref 16–288)
Iron: 64 ug/dL (ref 45–160)
TIBC: 238 mcg/dL (calc) — ABNORMAL LOW (ref 250–450)

## 2021-02-26 LAB — SEDIMENTATION RATE: Sed Rate: 9 mm/hr (ref 0–30)

## 2021-02-26 NOTE — Assessment & Plan Note (Signed)
Symptoms have not returned and she remains positive and optimistic due to her strong [place], which was shared today with me.  She has a strong support system

## 2021-02-26 NOTE — Assessment & Plan Note (Signed)
Well controlled No changes to regimen today. Patient is taking her medications as prescribed and notes no adverse effects.

## 2021-02-26 NOTE — Assessment & Plan Note (Signed)
confirmed with repeat biopsy.  She has resumed methotrexate and will need surveillance labs every 6 weeks

## 2021-02-26 NOTE — Assessment & Plan Note (Signed)
Thyroid function has been  WNL on current dose of 75 mcg daily .  No current changes needed.   Lab Results  Component Value Date   TSH 0.742 10/10/2020

## 2021-03-03 ENCOUNTER — Ambulatory Visit
Admission: RE | Admit: 2021-03-03 | Discharge: 2021-03-03 | Disposition: A | Discharge status: Home / Self Care | Payer: PPO | Source: Ambulatory Visit | Attending: Hematology and Oncology | Admitting: Hematology and Oncology

## 2021-03-03 ENCOUNTER — Other Ambulatory Visit: Payer: Self-pay

## 2021-03-03 DIAGNOSIS — C50912 Malignant neoplasm of unspecified site of left female breast: Secondary | ICD-10-CM | POA: Diagnosis not present

## 2021-03-03 DIAGNOSIS — Z1231 Encounter for screening mammogram for malignant neoplasm of breast: Secondary | ICD-10-CM | POA: Insufficient documentation

## 2021-03-03 DIAGNOSIS — Z170 Estrogen receptor positive status [ER+]: Secondary | ICD-10-CM | POA: Diagnosis not present

## 2021-03-04 ENCOUNTER — Ambulatory Visit: Payer: PPO | Admitting: Oncology

## 2021-03-04 ENCOUNTER — Other Ambulatory Visit: Payer: PPO

## 2021-03-09 ENCOUNTER — Other Ambulatory Visit: Payer: Self-pay | Admitting: Internal Medicine

## 2021-03-21 DIAGNOSIS — L932 Other local lupus erythematosus: Secondary | ICD-10-CM | POA: Diagnosis not present

## 2021-03-21 DIAGNOSIS — Z79899 Other long term (current) drug therapy: Secondary | ICD-10-CM | POA: Diagnosis not present

## 2021-04-04 ENCOUNTER — Ambulatory Visit: Payer: PPO

## 2021-04-10 ENCOUNTER — Telehealth: Payer: Self-pay

## 2021-04-10 ENCOUNTER — Ambulatory Visit (INDEPENDENT_AMBULATORY_CARE_PROVIDER_SITE_OTHER): Payer: PPO

## 2021-04-10 VITALS — Ht 63.0 in | Wt 142.0 lb

## 2021-04-10 DIAGNOSIS — Z0000 Encounter for general adult medical examination without abnormal findings: Secondary | ICD-10-CM | POA: Diagnosis not present

## 2021-04-10 NOTE — Telephone Encounter (Signed)
Opened in error

## 2021-04-10 NOTE — Progress Notes (Addendum)
Subjective:   Kim Santiago is a 86 y.o. female who presents for Medicare Annual (Subsequent) preventive examination.  Review of Systems    No ROS.  Medicare Wellness Virtual Visit.  Visual/audio telehealth visit, UTA vital signs.   See social history for additional risk factors.   Cardiac Risk Factors include: advanced age (>49mn, >>79women);hypertension     Objective:    Today's Vitals   04/10/21 0951  Weight: 142 lb (64.4 kg)  Height: '5\' 3"'$  (1.6 m)   Body mass index is 25.15 kg/m.  Advanced Directives 04/10/2021 10/10/2020 04/09/2020 04/03/2020 10/09/2019 09/05/2019 04/03/2019  Does Patient Have a Medical Advance Directive? Yes Yes Yes Yes Yes No Yes  Type of AParamedicof AMorrisvilleLiving will HMount Gay-ShamrockLiving will Living will;Healthcare Power of [place]Living will;Out of facility DNR (pink MOST or yellow form) Living will;Healthcare Power of [place]eroLiving will [place]wayLiving will  Does patient want to make changes to medical advance directive? No - Patient declined Yes (MAU/Ambulatory/Procedural Areas - Information given) No - Patient declined No - Patient declined No - Patient declined No - Patient declined No - Patient declined  Copy of [place] Chart? Yes - validated most recent copy scanned in chart (See row information) - Yes - validated most recent copy scanned in chart (See row information) Yes - validated most recent copy scanned in chart (See row information) Yes - validated most recent copy scanned in chart (See row information) No - copy requested Yes - validated most recent copy scanned in chart (See row information)  Would patient like information on creating a medical advance directive? - Yes (ED - Information included in AVS) No - Patient declined - No - Patient declined No - Patient declined -    Current Medications  (verified) Outpatient Encounter Medications as of 04/10/2021  Medication Sig   atorvastatin (LIPITOR) 20 MG tablet TAKE 1 TABLET BY MOUTH DAILY AT 6PM   Calcium Carbonate-Vitamin D 600-200 MG-UNIT TABS Take 2 tablets by mouth daily.   cholecalciferol (VITAMIN D) 1000 UNITS tablet Take 2,000 Units by mouth daily.   CRANBERRY PO Take 1 capsule by mouth 2 (two) times daily.   folic acid (FOLVITE) 1 MG tablet Take by mouth.   furosemide (LASIX) 20 MG tablet 1 TABLET EVERY OTHER DAY AS NEEDED   letrozole (FEMARA) 2.5 MG tablet TAKE 1 TABLET BY MOUTH EVERY DAY (Patient not taking: Reported on 02/25/2021)   levothyroxine (SYNTHROID) 75 MCG tablet TAKE 1 TABLET BY MOUTH (75MCG TOTAL) DAILY   losartan (COZAAR) 25 MG tablet TAKE 1 TABLET BY MOUTH EVERY DAY   methotrexate (RHEUMATREX) 2.5 MG tablet Take by mouth.   metoprolol succinate (TOPROL-XL) 25 MG 24 hr tablet TAKE 1 TABLET BY MOUTH EVERY DAY   nitroGLYCERIN (NITROSTAT) 0.4 MG SL tablet Place 1 tablet (0.4 mg total) under the tongue every 5 (five) minutes as needed for chest pain. Maximum 3 doses   omeprazole (PRILOSEC) 20 MG capsule Take 1 capsule (20 mg total) by mouth 2 (two) times daily before a meal.   ondansetron (ZOFRAN ODT) 4 MG disintegrating tablet Take 1 tablet (4 mg total) by mouth every 8 (eight) hours as needed for nausea or vomiting. (Patient not taking: No sig reported)   polyethylene glycol (MIRALAX / GLYCOLAX) packet Take 17 g by mouth daily.   Probiotic Product (PROBIOTIC PO) Take 1 tablet by mouth daily.  sodium fluoride (PREVIDENT 5000 PLUS) 1.1 % CREA dental cream Take 1 application by mouth daily.    SODIUM FLUORIDE 5000 PPM 1.1 % PSTE Take by mouth.   tizanidine (ZANAFLEX) 2 MG capsule Take 1 capsule (2 mg total) by mouth 3 (three) times daily. (Patient not taking: No sig reported)   triamcinolone ointment (KENALOG) 0.1 % Apply 1 application topically daily.   No facility-administered encounter medications on file as of  04/10/2021.    Allergies (verified) Azathioprine, Hydroxychloroquine, Mycophenolate mofetil, Amoxicillin, Codeine, Naprosyn [naproxen], Orudis [ketoprofen], Sulfa antibiotics, and Sulfathiazole   History: Past Medical History:  Diagnosis Date   Arthritis    Breast cancer ([place]) 2017   left mastectomy done 11/2015   Breast cancer in female [facility]) 11/18/2015   Left: 3.9 cm tumor, T2, 1/2 sentinel nodes positive for macro metastatic disease, N1, 3 negative nodes in the axillary tail, ER+,PR+, Her 2 neu, low Mammoprint score   Cancer (HCC)    thyroid takes levothyroxine   Chronic kidney disease    UTI   Genetic screening March 2017.   Mammoprint of left breast cancer: Low risk for recurrence.    History of heart attack 06/12/2014   Hypertension    hypothyroidism    secondary to thyroidectomy for thyroid ca   Hypothyroidism    Lupus ([place])    subcutaneous   Menopause 40s   natural, hot flashes and mood lability now gone, off prempro 7 months   Myocardial infarction [facility]) 2013   Osteoporosis    Osteopenia   Rosacea    Sjoegren syndrome    Stress-induced cardiomyopathy September of 2013   EF 35%. Peak troponin was 1.8.   Past Surgical History:  Procedure Laterality Date   BACK SURGERY     BREAST BIOPSY Left 10/30/15   positive, done in Dr. Dwyane Luo office   CARDIAC CATHETERIZATION  05/2012   [place]. No significant CAD. Ejection fraction of 35% due to stress-induced cardiomyopathy.   CHOLECYSTECTOMY     COLONOSCOPY     DILATION AND CURETTAGE OF UTERUS     KYPHOSIS SURGERY  Feb 2008   L1, Dr. Mauri Pole   LUMBAR DISC SURGERY     L4-L5   MASTECTOMY Left 11/18/2015   positive   SENTINEL NODE BIOPSY Left 11/18/2015   Procedure: SENTINEL NODE BIOPSY;  Surgeon: Robert Bellow, MD;  Location: [facility] ORS;  Service: General;  Laterality: Left;   SHOULDER ARTHROSCOPY  2004   Left, Dr. Jefm Bryant   SIMPLE MASTECTOMY WITH AXILLARY SENTINEL NODE BIOPSY Left 11/18/2015   Procedure: SIMPLE  MASTECTOMY;  Surgeon: Robert Bellow, MD;  Location: [facility] ORS;  Service: General;  Laterality: Left;   SPINE SURGERY     L4-5 diskectomy   THYROIDECTOMY     Thyroid Cancer   TONSILLECTOMY     TUBAL LIGATION     Family History  Problem Relation Age of Onset   Kidney disease Mother    Heart disease Mother    COPD Father    Cancer Father        esophageal   Kidney disease Sister    Cancer Brother 36       colon cancer (both brothers)   Heart attack Brother 65   Heart disease Brother    Breast cancer Neg Hx    Social History   Socioeconomic History   Marital status: Widowed    Spouse name: Not on file   Number of children: 1   Years of education: college  Highest education level: Not on file  Occupational History   Not on file  Tobacco Use   Smoking status: Never   Smokeless tobacco: Never  Vaping Use   Vaping Use: Never used  Substance and Sexual Activity   Alcohol use: No   Drug use: No   Sexual activity: Never  Other Topics Concern   Not on file  Social History Narrative   Has 1 adopted daughter.   Social Determinants of Health   Financial Resource Strain: Low Risk    Difficulty of Paying Living Expenses: Not hard at all  Food Insecurity: No Food Insecurity   Worried About Charity fundraiser in the Last Year: Never true   [place] in the Last Year: Never true  Transportation Needs: No Transportation Needs   Lack of Transportation (Medical): No   Lack of Transportation (Non-Medical): No  Physical Activity: Insufficiently Active   Days of Exercise per Week: 3 days   Minutes of Exercise per Session: 30 min  Stress: No Stress Concern Present   Feeling of Stress : Not at all  Social Connections: Unknown   Frequency of Communication with Friends and Family: More than three times a week   Frequency of Social Gatherings with Friends and Family: More than three times a week   Attends Religious Services: Not on Electrical engineer or  Organizations: Not on file   Attends Archivist Meetings: Not on file   Marital Status: Not on file    Tobacco Counseling Counseling given: Not Answered   Clinical Intake:  Pre-visit preparation completed: Yes        Diabetes: No  How often do you need to have someone help you when you read instructions, pamphlets, or other written materials from your doctor or pharmacy?: 1 - Never   Interpreter Needed?: No      Activities of Daily Living In your present state of health, do you have any difficulty performing the following activities: 04/10/2021  Hearing? Y  Comment Hearing aids  Vision? N  Difficulty concentrating or making decisions? N  Walking or climbing stairs? Y  Dressing or bathing? N  Doing errands, shopping? Y  Preparing Food and eating ? N  Using the Toilet? N  In the past six months, have you accidently leaked urine? N  Do you have problems with loss of bowel control? N  Managing your Medications? N  Managing your Finances? N  Housekeeping or managing your Housekeeping? N  Some recent data might be hidden    Patient Care Team: [name], MD as PCP - General (Internal Medicine) [name], MD (Internal Medicine) Bary Castilla Forest Gleason, MD (General Surgery) Oneta Rack, MD (Dermatology)  Indicate any recent Medical Services you may have received from other than [facility] providers in the past year (date may be approximate).     Assessment:   This is a routine wellness examination for Kim Santiago.  I connected with Kim Santiago today by telephone and verified that I am speaking with the correct person using two identifiers. Location patient: home Location provider: work Persons participating in the virtual visit: patient, Marine scientist.    I discussed the limitations, risks, security and privacy concerns of performing an evaluation and management service by telephone and the availability of in person appointments. The patient expressed  understanding and verbally consented to this telephonic visit.    Interactive audio and video telecommunications were attempted between this provider and patient,  however failed, due to patient having technical difficulties OR patient did not have access to video capability.  We continued and completed visit with audio only.  Some vital signs may be absent or patient reported.   Hearing/Vision screen Hearing Screening - Comments:: Hearing aid, bilateral  Vision Screening - Comments:: Followed by Dr. Matilde Sprang  Wears corrective lenses Cataract extraction, bilateral  They have seen their ophthalmologist in the last 12 months.  Dietary issues and exercise activities discussed: Current Exercise Habits: Home exercise routine, Type of exercise: walking, Intensity: Mild   Goals Addressed               This Visit's Progress     Patient Stated     Increase physical activity (pt-stated)        Stay active       Depression Screen PHQ 2/9 Scores 04/10/2021 11/20/2020 08/20/2020 07/23/2020 04/03/2020 07/14/2019 04/03/2019  PHQ - 2 Score 0 '1 2 2 '$ 0 0 0  PHQ- 9 Score - '6 8 7 '$ - - -    Fall Risk Fall Risk  02/25/2021 11/20/2020 08/20/2020 07/23/2020 04/03/2020  Falls in the past year? 1 0 0 0 0  Number falls in past yr: 1 - 0 - 0  Comment - - - - -  Injury with Fall? 1 - 0 - -  Comment - - - - -  Risk for fall due to : - - - - -  Follow up Falls evaluation completed Falls evaluation completed Falls evaluation completed Falls evaluation completed Falls evaluation completed    [place]: Adequate lighting in your home to reduce risk of falls? Yes   ASSISTIVE DEVICES UTILIZED TO PREVENT FALLS: Use of a cane, walker or w/c? No   TIMED UP AND GO: Was the test performed? No .   Cognitive Function: Patient is alert and oriented x3.  Enjoys brain health activities.  MMSE/6CIT deferred. Normal by direct communication/observation.   MMSE - Mini Mental State Exam  03/31/2018 03/30/2017 08/12/2015  Orientation to time '5 5 5  '$ Orientation to Place '5 5 5  '$ Registration '3 3 3  '$ Attention/ Calculation '5 5 5  '$ Recall '3 3 3  '$ Language- name 2 objects '2 2 2  '$ Language- repeat '1 1 1  '$ Language- follow 3 step command '3 3 3  '$ Language- read & follow direction '1 1 1  '$ Write a sentence '1 1 1  '$ Copy design '1 1 1  '$ Total score '30 30 30     '$ 6CIT Screen 04/03/2020 04/03/2019  What Year? 0 points 0 points  What month? 0 points 0 points  What time? 0 points 0 points  Count back from 20 0 points 0 points  Months in reverse 0 points 0 points  Repeat phrase 0 points 0 points  Total Score 0 0    Immunizations Immunization History  Administered Date(s) Administered   Influenza Split 07/08/2012   Influenza, High Dose Seasonal PF 06/08/2016, 06/11/2017, 06/06/2019   Influenza,inj,Quad PF,6+ Mos 06/08/2013, 05/04/2014, 05/06/2015   Influenza-Unspecified 05/04/2018, 06/04/2020   Moderna Sars-Covid-2 Vaccination 08/13/2020   PFIZER(Purple Top)SARS-COV-2 Vaccination 09/27/2019, 10/21/2019   Pneumococcal Conjugate-13 03/06/2014   Pneumococcal Polysaccharide-23 10/04/2018   Pneumococcal-Unspecified 10/11/2007   Tdap 01/05/2013   Zoster, Live 01/12/2012   Health Maintenance Health Maintenance  Topic Date Due   COVID-19 Vaccine (4 - Booster) 04/26/2021 (Originally 11/11/2020)   INFLUENZA VACCINE  05/27/2021 (Originally 04/07/2021)   Zoster Vaccines- Shingrix (1 of 2) 08/26/2021 (Originally  01/04/1954)   TETANUS/TDAP  01/06/2023   DEXA SCAN  Completed   PNA vac Low Risk Adult  Completed   HPV VACCINES  Aged Out   Mammogram status: Completed 02/2021. Repeat every year  Lung Cancer Screening: (Low Dose CT Chest recommended if Age 59-80 years, 30 pack-year currently smoking OR have quit w/in 15years.) does not qualify.   Vision Screening: Recommended annual ophthalmology exams for early detection of glaucoma and other disorders of the eye.  Dental Screening: Recommended annual  dental exams for proper oral hygiene  Community Resource Referral / Chronic Care Management: CRR required this visit?  No   CCM required this visit?  No      Plan:   Keep all routine maintenance appointments.   I have personally reviewed and noted the following in the patient's chart:   Medical and social history Use of alcohol, tobacco or illicit drugs  Current medications and supplements including opioid prescriptions. Patient is currently not taking opioid.  Functional ability and status Nutritional status Physical activity Advanced directives List of other physicians Hospitalizations, surgeries, and ER visits in previous 12 months Vitals Screenings to include cognitive, depression, and falls Referrals and appointments  In addition, I have reviewed and discussed with patient certain preventive protocols, quality metrics, and best practice recommendations. A written personalized care plan for preventive services as well as general preventive health recommendations were provided to patient via mychart.     OBrien-Blaney, Jahquez Steffler L, LPN   [id number]    I have reviewed the above information and agree with above.   Deborra Medina, MD

## 2021-04-10 NOTE — Patient Instructions (Addendum)
  Kim Santiago , Thank you for taking time to come for your Medicare Wellness Visit. I appreciate your ongoing commitment to your health goals. Please review the following plan we discussed and let me know if I can assist you in the future.   These are the goals we discussed:  Goals       Patient Stated     Increase physical activity (pt-stated)      Stay active        This is a list of the screening recommended for you and due dates:  Health Maintenance  Topic Date Due   COVID-19 Vaccine (4 - Booster) 04/26/2021*   Flu Shot  05/27/2021*   Zoster (Shingles) Vaccine (1 of 2) 08/26/2021*   Tetanus Vaccine  01/06/2023   DEXA scan (bone density measurement)  Completed   Pneumonia vaccines  Completed   HPV Vaccine  Aged Out  *Topic was postponed. The date shown is not the original due date.

## 2021-04-13 ENCOUNTER — Other Ambulatory Visit: Payer: Self-pay | Admitting: Internal Medicine

## 2021-04-13 DIAGNOSIS — E034 Atrophy of thyroid (acquired): Secondary | ICD-10-CM

## 2021-04-18 ENCOUNTER — Telehealth: Payer: Self-pay

## 2021-04-18 ENCOUNTER — Encounter: Payer: Self-pay | Admitting: Internal Medicine

## 2021-04-18 ENCOUNTER — Other Ambulatory Visit: Payer: Self-pay | Admitting: Internal Medicine

## 2021-04-18 ENCOUNTER — Telehealth (INDEPENDENT_AMBULATORY_CARE_PROVIDER_SITE_OTHER): Payer: PPO | Admitting: Internal Medicine

## 2021-04-18 DIAGNOSIS — U071 COVID-19: Secondary | ICD-10-CM

## 2021-04-18 MED ORDER — DOXYCYCLINE HYCLATE 100 MG PO TABS: 100.0000 mg | ORAL_TABLET | Freq: Two times a day (BID) | ORAL | 0 refills | Status: DC

## 2021-04-18 MED ORDER — PROMETHAZINE-PHENYLEPHRINE 6.25-5 MG/5ML PO SYRP
5.0000 mL | ORAL_SOLUTION | ORAL | 0 refills | Status: DC | PRN
Start: 2021-04-18 — End: 2021-07-08

## 2021-04-18 MED ORDER — AMOXICILLIN-POT CLAVULANATE 875-125 MG PO TABS: 1.0000 | ORAL_TABLET | Freq: Two times a day (BID) | ORAL | 0 refills | Status: DC

## 2021-04-18 MED ORDER — MOLNUPIRAVIR EUA 200MG CAPSULE: 4.0000 | ORAL_CAPSULE | Freq: Two times a day (BID) | ORAL | 0 refills | Status: AC

## 2021-04-18 NOTE — Patient Instructions (Signed)
The couhh medicine has a decongestant in it and an antinausea medication  you can use it every 4 hours as needed   The COVID anti viral needs to be started TONIGHT.    DO NOT START THE amoxicillin right away;  only start if your sinus drainage becomes green/brown/bloody OR you develop ear pain

## 2021-04-18 NOTE — Progress Notes (Signed)
Virtual Visit via [place] Note  This visit type was conducted due to national recommendations for restrictions regarding the COVID-19 pandemic (e.g. social distancing).  This format is felt to be most appropriate for this patient at this time.  All issues noted in this document were discussed and addressed.  No physical exam was performed (except for noted visual exam findings with Video Visits).   I connected withNAME@ on 04/18/21 at  1:15 PM EDT by a video enabled telemedicine application  and verified that I am speaking with the correct person using two identifiers. Location patient: home Location provider: work or home office Persons participating in the virtual visit: patient, provider  I discussed the limitations, risks, security and privacy concerns of performing an evaluation and management service by telephone and the availability of in person appointments. I also discussed with the patient that there may be a patient responsible charge related to this service. The patient expressed understanding and agreed to proceed.  Reason for visit: COVID INFECTION  HPI:  Martin Majestic to a wedding on Friday August 5.  Symptoms began on Tuesday august 9,  tested positive august 11 with a home test.  Intermittent nausea,  persistent sinus pain ,  dry cough has become productive.    Denies sob, wheezing and chest pain .  Pulse ox monitor ,  levesl have been 98%   Patient currently taking 30 mg prednisone and MTX dose recently increased to manage her cutaneous lupus.     ROS: See pertinent positives and negatives per HPI.  Past Medical History:  Diagnosis Date   Arthritis    Breast cancer ([place]) 2017   left mastectomy done 11/2015   Breast cancer in female [facility]) 11/18/2015   Left: 3.9 cm tumor, T2, 1/2 sentinel nodes positive for macro metastatic disease, N1, 3 negative nodes in the axillary tail, ER+,PR+, Her 2 neu, low Mammoprint score   Cancer (HCC)    thyroid takes levothyroxine   Chronic kidney  disease    UTI   Genetic screening March 2017.   Mammoprint of left breast cancer: Low risk for recurrence.    History of heart attack 06/12/2014   Hypertension    hypothyroidism    secondary to thyroidectomy for thyroid ca   Hypothyroidism    Lupus ([place])    subcutaneous   Menopause 40s   natural, hot flashes and mood lability now gone, off prempro 7 months   Myocardial infarction [facility]) 2013   Osteoporosis    Osteopenia   Rosacea    Sjoegren syndrome    Stress-induced cardiomyopathy September of 2013   EF 35%. Peak troponin was 1.8.    Past Surgical History:  Procedure Laterality Date   BACK SURGERY     BREAST BIOPSY Left 10/30/15   positive, done in Dr. Dwyane Luo office   CARDIAC CATHETERIZATION  05/2012   [place]. No significant CAD. Ejection fraction of 35% due to stress-induced cardiomyopathy.   CHOLECYSTECTOMY     COLONOSCOPY     DILATION AND CURETTAGE OF UTERUS     KYPHOSIS SURGERY  Feb 2008   L1, Dr. Mauri Pole   LUMBAR DISC SURGERY     L4-L5   MASTECTOMY Left 11/18/2015   positive   SENTINEL NODE BIOPSY Left 11/18/2015   Procedure: SENTINEL NODE BIOPSY;  Surgeon: Robert Bellow, MD;  Location: [facility] ORS;  Service: General;  Laterality: Left;   SHOULDER ARTHROSCOPY  2004   Left, Dr. Jefm Bryant   SIMPLE MASTECTOMY WITH AXILLARY SENTINEL NODE BIOPSY  Left 11/18/2015   Procedure: SIMPLE MASTECTOMY;  Surgeon: Robert Bellow, MD;  Location: [facility] ORS;  Service: General;  Laterality: Left;   SPINE SURGERY     L4-5 diskectomy   THYROIDECTOMY     Thyroid Cancer   TONSILLECTOMY     TUBAL LIGATION      Family History  Problem Relation Age of Onset   Kidney disease Mother    Heart disease Mother    COPD Father    Cancer Father        esophageal   Kidney disease Sister    Cancer Brother 1       colon cancer (both brothers)   Heart attack Brother 57   Heart disease Brother    Breast cancer Neg Hx     SOCIAL HX:  reports that she has never smoked. She has never used  smokeless tobacco. She reports that she does not drink alcohol and does not use drugs.    Current Outpatient Medications:    atorvastatin (LIPITOR) 20 MG tablet, TAKE 1 TABLET BY MOUTH DAILY AT 6PM, Disp: 90 tablet, Rfl: 1   Calcium Carbonate-Vitamin D 600-200 MG-UNIT TABS, Take 2 tablets by mouth daily., Disp: , Rfl:    cholecalciferol (VITAMIN D) 1000 UNITS tablet, Take 2,000 Units by mouth daily., Disp: , Rfl:    CRANBERRY PO, Take 1 capsule by mouth 2 (two) times daily., Disp: , Rfl:    folic acid (FOLVITE) 1 MG tablet, Take by mouth., Disp: , Rfl:    furosemide (LASIX) 20 MG tablet, 1 TABLET EVERY OTHER DAY AS NEEDED, Disp: 45 tablet, Rfl: 2   levothyroxine (SYNTHROID) 75 MCG tablet, TAKE 1 TABLET BY MOUTH (75MCG TOTAL) DAILY, Disp: 90 tablet, Rfl: 2   losartan (COZAAR) 25 MG tablet, TAKE 1 TABLET BY MOUTH EVERY DAY, Disp: 90 tablet, Rfl: 1   methotrexate (RHEUMATREX) 2.5 MG tablet, Take by mouth. Pt is taking 6 tablets daily., Disp: , Rfl:    metoprolol succinate (TOPROL-XL) 25 MG 24 hr tablet, TAKE 1 TABLET BY MOUTH EVERY DAY, Disp: 90 tablet, Rfl: 3   molnupiravir EUA 200 mg CAPS, Take 4 capsules (800 mg total) by mouth 2 (two) times daily for 5 days., Disp: 40 capsule, Rfl: 0   nitroGLYCERIN (NITROSTAT) 0.4 MG SL tablet, Place 1 tablet (0.4 mg total) under the tongue every 5 (five) minutes as needed for chest pain. Maximum 3 doses, Disp: 50 tablet, Rfl: 3   omeprazole (PRILOSEC) 20 MG capsule, Take 1 capsule (20 mg total) by mouth 2 (two) times daily before a meal., Disp: 180 capsule, Rfl: 1   ondansetron (ZOFRAN ODT) 4 MG disintegrating tablet, Take 1 tablet (4 mg total) by mouth every 8 (eight) hours as needed for nausea or vomiting., Disp: 20 tablet, Rfl: 0   polyethylene glycol (MIRALAX / GLYCOLAX) packet, Take 17 g by mouth daily., Disp: , Rfl:    predniSONE (DELTASONE) 20 MG tablet, PLEASE SEE ATTACHED FOR DETAILED DIRECTIONS, Disp: , Rfl:    Probiotic Product (PROBIOTIC PO), Take  1 tablet by mouth daily., Disp: , Rfl:    promethazine-phenylephrine 6.25-5 MG/5ML SYRP, Take 5 mLs by mouth every 4 (four) hours as needed for congestion., Disp: 180 mL, Rfl: 0   sodium fluoride (PREVIDENT 5000 PLUS) 1.1 % CREA dental cream, Take 1 application by mouth daily. , Disp: , Rfl:    SODIUM FLUORIDE 5000 PPM 1.1 % PSTE, Take by mouth., Disp: , Rfl:    tizanidine (ZANAFLEX) 2  MG capsule, Take 1 capsule (2 mg total) by mouth 3 (three) times daily., Disp: 90 capsule, Rfl: 0   traZODone (DESYREL) 50 MG tablet, Take 50 mg by mouth at bedtime., Disp: , Rfl:    triamcinolone cream (KENALOG) 0.1 %, APPLY TO AFFECTED AREA TWICE A DAY, Disp: , Rfl:    triamcinolone ointment (KENALOG) 0.1 %, Apply 1 application topically daily., Disp: , Rfl: 0   doxycycline (VIBRA-TABS) 100 MG tablet, Take 1 tablet (100 mg total) by mouth 2 (two) times daily., Disp: 14 tablet, Rfl: 0  EXAM:  VITALS per patient if applicable:  GENERAL: alert, oriented, appears mildly ill,   in no acute distress  HEENT: atraumatic, conjunttiva clear, no obvious abnormalities on inspection of external nose and ears  NECK: normal movements of the head and neck  LUNGS: on inspection no signs of respiratory distress, breathing rate appears normal, no obvious gross SOB, gasping or wheezing  CV: no obvious cyanosis  MS: moves all visible extremities without noticeable abnormality  PSYCH/NEURO: pleasant and cooperative, no obvious depression or anxiety, speech and thought processing grossly intact  ASSESSMENT AND PLAN:  Discussed the following assessment and plan:  COVID-19 virus infection  COVID-19 virus infection molnupiravir prescribed for patient along with cough suppressant.  Adding  antibiotic to use prn sinusitis.     I discussed the assessment and treatment plan with the patient. The patient was provided an opportunity to ask questions and all were answered. The patient agreed with the plan and demonstrated an  understanding of the instructions.   The patient was advised to call back or seek an in-person evaluation if the symptoms worsen or if the condition fails to improve as anticipated.   I spent 30 minutes dedicated to the care of this patient on the date of this encounter to include pre-visit review of his medical history,  Face-to-face time with the patient , and post visit ordering of testing and therapeutics.    Kim Mc, MD

## 2021-04-18 NOTE — Telephone Encounter (Signed)
Pt sent a mychart stating:   [facility] called to say that Augmentin has amoxycillin in it, which I'm allergic to.  They suggested a Z-pack or doxycycline. Thanks so much, [name]

## 2021-04-20 DIAGNOSIS — U071 COVID-19: Secondary | ICD-10-CM | POA: Insufficient documentation

## 2021-04-20 NOTE — Assessment & Plan Note (Addendum)
molnupiravir prescribed for patient along with cough suppressant.  Adding  antibiotic to use prn sinusitis.

## 2021-04-22 MED ORDER — PROMETHAZINE-DM 6.25-15 MG/5ML PO SYRP: 5.0000 mL | ORAL_SOLUTION | Freq: Four times a day (QID) | ORAL | 0 refills | Status: DC | PRN

## 2021-04-22 NOTE — Telephone Encounter (Signed)
Kim Santiago.  I was on vacation yesterday.  In the future.  there is  a time sensitive medication issue please send to Doc of the Day so patient doesn't go without meds .  Thanks  TT

## 2021-04-29 ENCOUNTER — Telehealth: Payer: Self-pay | Admitting: Internal Medicine

## 2021-04-29 ENCOUNTER — Other Ambulatory Visit: Payer: Self-pay | Admitting: Internal Medicine

## 2021-04-29 DIAGNOSIS — Z79899 Other long term (current) drug therapy: Secondary | ICD-10-CM

## 2021-04-29 NOTE — Telephone Encounter (Signed)
patient is due for non fasting labs needed by her dermatologist.  Can you  Confrim that she has an appt this week

## 2021-04-29 NOTE — Telephone Encounter (Signed)
Spoke with pt's daughter and and she stated that the pt's dermatology appt isn't until 05/10/2021 and pt has no appts with [place] this week. Does the non fasting lab appt need to be closer to the dermatology appt?    Also daughter wanted to let you know that the pt is still very sick from covid. She stated that she seemed much better towards the end of last week but then woke Saturday morning and had spiked a fever so pt started taking the antibiotics. She stated pt seemed to be getting better getting better but then she woke up this morning stating that she felt like she was getting a "new cold". Pt is still taking the antibiotics and Quercitin.

## 2021-04-30 NOTE — Telephone Encounter (Signed)
Spoke with pt and scheduled her for a lab appt. Pt stated that she is still not feeling back to normal yet so she asked to schedule the appt for the week of labor day.

## 2021-05-05 ENCOUNTER — Inpatient Hospital Stay
Admission: EM | Admit: 2021-05-05 | Discharge: 2021-05-13 | DRG: 177 | Disposition: A | Discharge status: Home Health | Payer: PPO | Attending: Family Medicine | Admitting: Family Medicine

## 2021-05-05 ENCOUNTER — Other Ambulatory Visit: Payer: Self-pay

## 2021-05-05 ENCOUNTER — Emergency Department: Payer: PPO

## 2021-05-05 DIAGNOSIS — Z85850 Personal history of malignant neoplasm of thyroid: Secondary | ICD-10-CM | POA: Diagnosis not present

## 2021-05-05 DIAGNOSIS — R000 Tachycardia, unspecified: Secondary | ICD-10-CM | POA: Diagnosis not present

## 2021-05-05 DIAGNOSIS — Z853 Personal history of malignant neoplasm of breast: Secondary | ICD-10-CM

## 2021-05-05 DIAGNOSIS — E785 Hyperlipidemia, unspecified: Secondary | ICD-10-CM | POA: Diagnosis not present

## 2021-05-05 DIAGNOSIS — E039 Hypothyroidism, unspecified: Secondary | ICD-10-CM | POA: Diagnosis not present

## 2021-05-05 DIAGNOSIS — E890 Postprocedural hypothyroidism: Secondary | ICD-10-CM | POA: Diagnosis not present

## 2021-05-05 DIAGNOSIS — R531 Weakness: Secondary | ICD-10-CM | POA: Diagnosis not present

## 2021-05-05 DIAGNOSIS — Z79890 Hormone replacement therapy (postmenopausal): Secondary | ICD-10-CM

## 2021-05-05 DIAGNOSIS — E871 Hypo-osmolality and hyponatremia: Secondary | ICD-10-CM | POA: Diagnosis not present

## 2021-05-05 DIAGNOSIS — U071 COVID-19: Principal | ICD-10-CM | POA: Diagnosis present

## 2021-05-05 DIAGNOSIS — Z66 Do not resuscitate: Secondary | ICD-10-CM | POA: Diagnosis not present

## 2021-05-05 DIAGNOSIS — Z9049 Acquired absence of other specified parts of digestive tract: Secondary | ICD-10-CM | POA: Diagnosis not present

## 2021-05-05 DIAGNOSIS — J189 Pneumonia, unspecified organism: Secondary | ICD-10-CM

## 2021-05-05 DIAGNOSIS — Z8673 Personal history of transient ischemic attack (TIA), and cerebral infarction without residual deficits: Secondary | ICD-10-CM

## 2021-05-05 DIAGNOSIS — E872 Acidosis: Secondary | ICD-10-CM | POA: Diagnosis present

## 2021-05-05 DIAGNOSIS — D649 Anemia, unspecified: Secondary | ICD-10-CM | POA: Diagnosis not present

## 2021-05-05 DIAGNOSIS — J1282 Pneumonia due to coronavirus disease 2019: Secondary | ICD-10-CM | POA: Diagnosis present

## 2021-05-05 DIAGNOSIS — F419 Anxiety disorder, unspecified: Secondary | ICD-10-CM | POA: Diagnosis present

## 2021-05-05 DIAGNOSIS — D849 Immunodeficiency, unspecified: Secondary | ICD-10-CM | POA: Diagnosis present

## 2021-05-05 DIAGNOSIS — K120 Recurrent oral aphthae: Secondary | ICD-10-CM | POA: Diagnosis present

## 2021-05-05 DIAGNOSIS — R918 Other nonspecific abnormal finding of lung field: Secondary | ICD-10-CM | POA: Diagnosis not present

## 2021-05-05 DIAGNOSIS — D539 Nutritional anemia, unspecified: Secondary | ICD-10-CM | POA: Diagnosis not present

## 2021-05-05 DIAGNOSIS — R609 Edema, unspecified: Secondary | ICD-10-CM

## 2021-05-05 DIAGNOSIS — D696 Thrombocytopenia, unspecified: Secondary | ICD-10-CM | POA: Diagnosis not present

## 2021-05-05 DIAGNOSIS — E861 Hypovolemia: Secondary | ICD-10-CM | POA: Diagnosis present

## 2021-05-05 DIAGNOSIS — Z800 Family history of malignant neoplasm of digestive organs: Secondary | ICD-10-CM

## 2021-05-05 DIAGNOSIS — J9601 Acute respiratory failure with hypoxia: Secondary | ICD-10-CM | POA: Diagnosis not present

## 2021-05-05 DIAGNOSIS — Z170 Estrogen receptor positive status [ER+]: Secondary | ICD-10-CM

## 2021-05-05 DIAGNOSIS — F32A Depression, unspecified: Secondary | ICD-10-CM | POA: Diagnosis present

## 2021-05-05 DIAGNOSIS — M3500 Sicca syndrome, unspecified: Secondary | ICD-10-CM | POA: Diagnosis not present

## 2021-05-05 DIAGNOSIS — R627 Adult failure to thrive: Secondary | ICD-10-CM | POA: Diagnosis not present

## 2021-05-05 DIAGNOSIS — D7589 Other specified diseases of blood and blood-forming organs: Secondary | ICD-10-CM | POA: Diagnosis not present

## 2021-05-05 DIAGNOSIS — Z8249 Family history of ischemic heart disease and other diseases of the circulatory system: Secondary | ICD-10-CM

## 2021-05-05 DIAGNOSIS — D72819 Decreased white blood cell count, unspecified: Secondary | ICD-10-CM | POA: Diagnosis not present

## 2021-05-05 DIAGNOSIS — N189 Chronic kidney disease, unspecified: Secondary | ICD-10-CM | POA: Diagnosis not present

## 2021-05-05 DIAGNOSIS — K219 Gastro-esophageal reflux disease without esophagitis: Secondary | ICD-10-CM | POA: Diagnosis present

## 2021-05-05 DIAGNOSIS — R9389 Abnormal findings on diagnostic imaging of other specified body structures: Secondary | ICD-10-CM | POA: Diagnosis not present

## 2021-05-05 DIAGNOSIS — I700 Atherosclerosis of aorta: Secondary | ICD-10-CM | POA: Diagnosis not present

## 2021-05-05 DIAGNOSIS — Z9012 Acquired absence of left breast and nipple: Secondary | ICD-10-CM | POA: Diagnosis not present

## 2021-05-05 DIAGNOSIS — I252 Old myocardial infarction: Secondary | ICD-10-CM

## 2021-05-05 DIAGNOSIS — W19XXXA Unspecified fall, initial encounter: Secondary | ICD-10-CM | POA: Diagnosis not present

## 2021-05-05 DIAGNOSIS — L932 Other local lupus erythematosus: Secondary | ICD-10-CM | POA: Diagnosis not present

## 2021-05-05 DIAGNOSIS — I130 Hypertensive heart and chronic kidney disease with heart failure and stage 1 through stage 4 chronic kidney disease, or unspecified chronic kidney disease: Secondary | ICD-10-CM | POA: Diagnosis not present

## 2021-05-05 DIAGNOSIS — M810 Age-related osteoporosis without current pathological fracture: Secondary | ICD-10-CM | POA: Diagnosis present

## 2021-05-05 DIAGNOSIS — I5042 Chronic combined systolic (congestive) and diastolic (congestive) heart failure: Secondary | ICD-10-CM | POA: Diagnosis not present

## 2021-05-05 DIAGNOSIS — L930 Discoid lupus erythematosus: Secondary | ICD-10-CM | POA: Diagnosis not present

## 2021-05-05 DIAGNOSIS — J90 Pleural effusion, not elsewhere classified: Secondary | ICD-10-CM | POA: Diagnosis not present

## 2021-05-05 DIAGNOSIS — I2510 Atherosclerotic heart disease of native coronary artery without angina pectoris: Secondary | ICD-10-CM | POA: Diagnosis present

## 2021-05-05 DIAGNOSIS — I5021 Acute systolic (congestive) heart failure: Secondary | ICD-10-CM | POA: Diagnosis not present

## 2021-05-05 DIAGNOSIS — J811 Chronic pulmonary edema: Secondary | ICD-10-CM | POA: Diagnosis not present

## 2021-05-05 DIAGNOSIS — E878 Other disorders of electrolyte and fluid balance, not elsewhere classified: Secondary | ICD-10-CM | POA: Diagnosis present

## 2021-05-05 HISTORY — DX: Pneumonia, unspecified organism: J18.9

## 2021-05-05 LAB — URINALYSIS, COMPLETE (UACMP) WITH MICROSCOPIC
Bacteria, UA: NONE SEEN
Bilirubin Urine: NEGATIVE
Glucose, UA: NEGATIVE mg/dL
Hgb urine dipstick: NEGATIVE
Ketones, ur: 5 mg/dL — AB
Leukocytes,Ua: NEGATIVE
Nitrite: NEGATIVE
Protein, ur: NEGATIVE mg/dL
Specific Gravity, Urine: 1.016 (ref 1.005–1.030)
pH: 6 (ref 5.0–8.0)

## 2021-05-05 LAB — CBC
HCT: 25 % — ABNORMAL LOW (ref 36.0–46.0)
Hemoglobin: 9 g/dL — ABNORMAL LOW (ref 12.0–15.0)
MCH: 37.3 pg — ABNORMAL HIGH (ref 26.0–34.0)
MCHC: 36 g/dL (ref 30.0–36.0)
MCV: 103.7 fL — ABNORMAL HIGH (ref 80.0–100.0)
Platelets: 138 10*3/uL — ABNORMAL LOW (ref 150–400)
RBC: 2.41 MIL/uL — ABNORMAL LOW (ref 3.87–5.11)
RDW: 17.7 % — ABNORMAL HIGH (ref 11.5–15.5)
WBC: 4.1 10*3/uL (ref 4.0–10.5)
nRBC: 0 % (ref 0.0–0.2)

## 2021-05-05 LAB — BASIC METABOLIC PANEL
Anion gap: 7 (ref 5–15)
BUN: 20 mg/dL (ref 8–23)
CO2: 29 mmol/L (ref 22–32)
Calcium: 8.7 mg/dL — ABNORMAL LOW (ref 8.9–10.3)
Chloride: 93 mmol/L — ABNORMAL LOW (ref 98–111)
Creatinine, Ser: 0.92 mg/dL (ref 0.44–1.00)
GFR, Estimated: >60 mL/min (ref >60)
Glucose, Bld: 103 mg/dL — ABNORMAL HIGH (ref 70–99)
Potassium: 4.4 mmol/L (ref 3.5–5.1)
Sodium: 129 mmol/L — ABNORMAL LOW (ref 135–145)

## 2021-05-05 LAB — RESP PANEL BY RT-PCR (FLU A&B, COVID) ARPGX2
Influenza A by PCR: NEGATIVE
Influenza B by PCR: NEGATIVE
SARS Coronavirus 2 by RT PCR: POSITIVE — AB

## 2021-05-05 LAB — LACTIC ACID, PLASMA
Lactic Acid, Venous: 1.1 mmol/L (ref 0.5–1.9)
Lactic Acid, Venous: 1.2 mmol/L (ref 0.5–1.9)

## 2021-05-05 MED ORDER — SERTRALINE HCL 50 MG PO TABS
25.0000 mg | ORAL_TABLET | Freq: Every day | ORAL | Status: DC
  Administered 2021-05-06 – 2021-05-13 (×8): 25 mg via ORAL
  Filled 2021-05-05 (×8): qty 1

## 2021-05-05 MED ORDER — RISAQUAD PO CAPS
1.0000 | ORAL_CAPSULE | Freq: Every day | ORAL | Status: DC
  Administered 2021-05-06 – 2021-05-13 (×8): 1 via ORAL
  Filled 2021-05-05 (×8): qty 1

## 2021-05-05 MED ORDER — CALCIUM CARBONATE-VITAMIN D 500-200 MG-UNIT PO TABS
2.0000 | ORAL_TABLET | Freq: Every day | ORAL | Status: DC
  Administered 2021-05-06 – 2021-05-13 (×8): 2 via ORAL
  Filled 2021-05-05 (×8): qty 2

## 2021-05-05 MED ORDER — ACETAMINOPHEN 650 MG RE SUPP: 650.0000 mg | Freq: Four times a day (QID) | RECTAL | Status: DC | PRN

## 2021-05-05 MED ORDER — TRAZODONE HCL 50 MG PO TABS
50.0000 mg | ORAL_TABLET | Freq: Every day | ORAL | Status: DC
  Administered 2021-05-06 – 2021-05-12 (×8): 50 mg via ORAL
  Filled 2021-05-05 (×8): qty 1

## 2021-05-05 MED ORDER — PANTOPRAZOLE SODIUM 40 MG PO TBEC
40.0000 mg | DELAYED_RELEASE_TABLET | Freq: Every day | ORAL | Status: DC
  Administered 2021-05-06 – 2021-05-13 (×8): 40 mg via ORAL
  Filled 2021-05-05 (×8): qty 1

## 2021-05-05 MED ORDER — CRANBERRY 250 MG PO TABS: ORAL_TABLET | Freq: Two times a day (BID) | ORAL | Status: DC

## 2021-05-05 MED ORDER — ACETAMINOPHEN 325 MG PO TABS
650.0000 mg | ORAL_TABLET | Freq: Four times a day (QID) | ORAL | Status: DC | PRN
  Administered 2021-05-07 – 2021-05-09 (×7): 650 mg via ORAL
  Filled 2021-05-05 (×7): qty 2

## 2021-05-05 MED ORDER — NITROGLYCERIN 0.4 MG SL SUBL: 0.4000 mg | SUBLINGUAL_TABLET | SUBLINGUAL | Status: DC | PRN

## 2021-05-05 MED ORDER — POLYETHYLENE GLYCOL 3350 17 G PO PACK
17.0000 g | PACK | Freq: Every day | ORAL | Status: DC
  Administered 2021-05-06 – 2021-05-12 (×5): 17 g via ORAL
  Filled 2021-05-05 (×6): qty 1

## 2021-05-05 MED ORDER — ONDANSETRON HCL 4 MG/2ML IJ SOLN
4.0000 mg | Freq: Four times a day (QID) | INTRAMUSCULAR | Status: DC | PRN
  Administered 2021-05-08: 08:00:00 4 mg via INTRAVENOUS
  Filled 2021-05-05: qty 2

## 2021-05-05 MED ORDER — TIZANIDINE HCL 2 MG PO CAPS: 2.0000 mg | ORAL_CAPSULE | Freq: Three times a day (TID) | ORAL | Status: DC

## 2021-05-05 MED ORDER — VITAMIN D 25 MCG (1000 UNIT) PO TABS
2000.0000 [IU] | ORAL_TABLET | Freq: Every day | ORAL | Status: DC
  Administered 2021-05-06 – 2021-05-13 (×8): 2000 [IU] via ORAL
  Filled 2021-05-05 (×7): qty 2

## 2021-05-05 MED ORDER — ONDANSETRON HCL 4 MG PO TABS: 4.0000 mg | ORAL_TABLET | Freq: Four times a day (QID) | ORAL | Status: DC | PRN

## 2021-05-05 MED ORDER — SODIUM CHLORIDE 0.9 % IV SOLN: INTRAVENOUS | Status: DC

## 2021-05-05 MED ORDER — TRAZODONE HCL 50 MG PO TABS: 25.0000 mg | ORAL_TABLET | Freq: Every evening | ORAL | Status: DC | PRN

## 2021-05-05 MED ORDER — LOSARTAN POTASSIUM 25 MG PO TABS
25.0000 mg | ORAL_TABLET | Freq: Every day | ORAL | Status: DC
  Administered 2021-05-06 – 2021-05-13 (×8): 25 mg via ORAL
  Filled 2021-05-05 (×8): qty 1

## 2021-05-05 MED ORDER — SODIUM CHLORIDE 0.9 % IV SOLN
2.0000 g | INTRAVENOUS | Status: AC
  Administered 2021-05-06 – 2021-05-10 (×4): 2 g via INTRAVENOUS
  Filled 2021-05-05: qty 2
  Filled 2021-05-05 (×3): qty 20

## 2021-05-05 MED ORDER — SODIUM CHLORIDE 0.9 % IV SOLN
500.0000 mg | Freq: Once | INTRAVENOUS | Status: DC
  Filled 2021-05-05: qty 500

## 2021-05-05 MED ORDER — SODIUM CHLORIDE 0.9 % IV SOLN
500.0000 mg | INTRAVENOUS | Status: AC
  Administered 2021-05-06 – 2021-05-10 (×5): 500 mg via INTRAVENOUS
  Filled 2021-05-05 (×4): qty 500

## 2021-05-05 MED ORDER — METOPROLOL SUCCINATE ER 25 MG PO TB24
25.0000 mg | ORAL_TABLET | Freq: Every day | ORAL | Status: DC
  Administered 2021-05-06 – 2021-05-13 (×8): 25 mg via ORAL
  Filled 2021-05-05 (×8): qty 1

## 2021-05-05 MED ORDER — METHOTREXATE 2.5 MG PO TABS: 15.0000 mg | ORAL_TABLET | ORAL | Status: DC

## 2021-05-05 MED ORDER — MAGNESIUM HYDROXIDE 400 MG/5ML PO SUSP
30.0000 mL | Freq: Every day | ORAL | Status: DC | PRN
  Filled 2021-05-05: qty 30

## 2021-05-05 MED ORDER — ATORVASTATIN CALCIUM 20 MG PO TABS
20.0000 mg | ORAL_TABLET | Freq: Every day | ORAL | Status: DC
  Administered 2021-05-06 – 2021-05-13 (×8): 20 mg via ORAL
  Filled 2021-05-05 (×8): qty 1

## 2021-05-05 MED ORDER — SODIUM CHLORIDE 0.9 % IV SOLN
1.0000 g | Freq: Once | INTRAVENOUS | Status: DC
  Administered 2021-05-05: 1 g via INTRAVENOUS
  Filled 2021-05-05: qty 10

## 2021-05-05 MED ORDER — LEVOTHYROXINE SODIUM 50 MCG PO TABS
75.0000 ug | ORAL_TABLET | Freq: Every day | ORAL | Status: DC
  Administered 2021-05-06 – 2021-05-13 (×8): 75 ug via ORAL
  Filled 2021-05-05 (×8): qty 1

## 2021-05-05 MED ORDER — ENOXAPARIN SODIUM 40 MG/0.4ML IJ SOSY
40.0000 mg | PREFILLED_SYRINGE | INTRAMUSCULAR | Status: DC
  Administered 2021-05-06 – 2021-05-12 (×7): 40 mg via SUBCUTANEOUS
  Filled 2021-05-05 (×8): qty 0.4

## 2021-05-05 MED ORDER — FOLIC ACID 1 MG PO TABS
1.0000 mg | ORAL_TABLET | Freq: Every day | ORAL | Status: DC
  Administered 2021-05-06 – 2021-05-13 (×8): 1 mg via ORAL
  Filled 2021-05-05 (×8): qty 1

## 2021-05-05 MED ORDER — GUAIFENESIN ER 600 MG PO TB12
600.0000 mg | ORAL_TABLET | Freq: Two times a day (BID) | ORAL | Status: DC
  Administered 2021-05-06 – 2021-05-13 (×15): 600 mg via ORAL
  Filled 2021-05-05 (×15): qty 1

## 2021-05-05 NOTE — ED Provider Notes (Signed)
[facility] Emergency Department Provider Note ____________________________________________   Event Date/Time   First MD Initiated Contact with Patient 05/05/21 2007     (approximate)  I have reviewed the triage vital signs and the nursing notes.  HISTORY  Chief Complaint Weakness   HPI Kim Santiago is a 86 y.o. femalewho presents to the ED for evaluation of generalized weakness.   Chart review indicates history of lupus on methotrexate. Patient lives at home with her daughter and son-in-law.  Typically ambulates independently without assistance device.  She tested positive for COVID-19 about 4 weeks ago, at the beginning of August.  Patient is brought to the ED, accompanied by her daughter, for evaluation of worsening weakness, failure to thrive in the setting of persistent cough.  She reports her cough is improved drastically from when she originally had COVID, but still has a persistent cough with sputum production of thick mucus.  She reports no fevers, chest pain, syncopal episodes, abdominal pain or emesis.  While she typically ambulates independently, she has quickly gotten weaker over the past few weeks.  Was using a walker and cane for the past couple weeks, but today could not even get up and stand.  No falls or significant injuries.  Past Medical History:  Diagnosis Date   Arthritis    Breast cancer ([place]) 2017   left mastectomy done 11/2015   Breast cancer in female [facility]) 11/18/2015   Left: 3.9 cm tumor, T2, 1/2 sentinel nodes positive for macro metastatic disease, N1, 3 negative nodes in the axillary tail, ER+,PR+, Her 2 neu, low Mammoprint score   Cancer (HCC)    thyroid takes levothyroxine   Chronic kidney disease    UTI   Genetic screening March 2017.   Mammoprint of left breast cancer: Low risk for recurrence.    History of heart attack 06/12/2014   Hypertension    hypothyroidism    secondary to thyroidectomy for thyroid ca    Hypothyroidism    Lupus ([place])    subcutaneous   Menopause 40s   natural, hot flashes and mood lability now gone, off prempro 7 months   Myocardial infarction [facility]) 2013   Osteoporosis    Osteopenia   Rosacea    Sjoegren syndrome    Stress-induced cardiomyopathy September of 2013   EF 35%. Peak troponin was 1.8.    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[place]) 01/16/2020   Other drug-induced neutropenia ([place]) 01/16/2020   Hemiplegia affecting dominant side, post-stroke ([place]) 01/16/2020   Does use hearing aid 07/14/2019   Hypothyroidism due to acquired atrophy of thyroid 04/08/2019   Impaired balance as late effect of cerebrovascular accident 11/13/2018   History of CVA with residual deficit 11/13/2018   Leukopenia 11/11/2018   Anxiety about health 07/05/2018   Mediastinal adenopathy 04/27/2018   Edema due to hypoalbuminemia 02/20/2018   Hospital discharge follow-up 02/20/2018   History of vertebral fracture 02/18/2018   GERD (gastroesophageal reflux disease) 10/31/2017   Hand joint pain 06/13/2017   Vaginal atrophy 05/11/2017   Venous insufficiency of both lower extremities 01/10/2017   Discoid lupus 06/26/2016   Anemia 05/24/2016   Recurrent UTI 04/29/2016   TMJ syndrome 03/31/2016   Hearing loss 03/12/2016   Osteopenia 12/05/2015  Malignant neoplasm of left female breast ([place]) 11/26/2015   Hx of thyroid cancer 06/12/2014   Adenomatous polyp 06/12/2014   Major depressive disorder, recurrent episode, moderate ([place]) 05/29/2014   Grief 04/08/2014   Sjogren's syndrome ([place]) 03/09/2014   Insomnia due to stress 03/06/2014   Hyperlipidemia 12/28/2012   Medicare annual wellness visit, subsequent 12/28/2012   Essential  hypertension 10/02/2011   Iatrogenic hypothyroidism 10/02/2011   Purpura ([place]) 05/25/2011    Past Surgical History:  Procedure Laterality Date   BACK SURGERY     BREAST BIOPSY Left 10/30/15   positive, done in Dr. Dwyane Luo office   CARDIAC CATHETERIZATION  05/2012   [address]. No significant CAD. Ejection fraction of 35% due to stress-induced cardiomyopathy.   CHOLECYSTECTOMY     COLONOSCOPY     DILATION AND CURETTAGE OF UTERUS     KYPHOSIS SURGERY  Feb 2008   L1, Dr. Mauri Pole   LUMBAR DISC SURGERY     L4-L5   MASTECTOMY Left 11/18/2015   positive   SENTINEL NODE BIOPSY Left 11/18/2015   Procedure: SENTINEL NODE BIOPSY;  Surgeon: Robert Bellow, MD;  Location: [facility] ORS;  Service: General;  Laterality: Left;   SHOULDER ARTHROSCOPY  2004   Left, Dr. Jefm Bryant   SIMPLE MASTECTOMY WITH AXILLARY SENTINEL NODE BIOPSY Left 11/18/2015   Procedure: SIMPLE MASTECTOMY;  Surgeon: Robert Bellow, MD;  Location: [facility] ORS;  Service: General;  Laterality: Left;   SPINE SURGERY     L4-5 diskectomy   THYROIDECTOMY     Thyroid Cancer   TONSILLECTOMY     TUBAL LIGATION      Prior to Admission medications   Medication Sig Start Date End Date Taking? Authorizing Provider  atorvastatin (LIPITOR) 20 MG tablet TAKE 1 TABLET BY MOUTH DAILY AT 6PM 03/11/21  Yes [name], MD  Calcium Carbonate-Vitamin D 600-200 MG-UNIT TABS Take 2 tablets by mouth daily.   Yes [provider]  cholecalciferol (VITAMIN D) 1000 UNITS tablet Take 2,000 Units by mouth daily.   Yes [provider]  CRANBERRY PO Take 1 capsule by mouth 2 (two) times daily.   Yes [provider]  folic acid (FOLVITE) 1 MG tablet Take by mouth. 07/26/20 07/26/21 Yes [provider]  furosemide (LASIX) 20 MG tablet 1 TABLET EVERY OTHER DAY AS NEEDED 10/21/20  Yes [name], MD  levothyroxine (SYNTHROID) 75 MCG tablet TAKE 1 TABLET BY MOUTH (75MCG TOTAL) DAILY 04/14/21  Yes [name], MD  losartan  (COZAAR) 25 MG tablet TAKE 1 TABLET BY MOUTH EVERY DAY 11/13/20  Yes [name], MD  methotrexate (RHEUMATREX) 2.5 MG tablet Take 15 mg by mouth once a week. Pt is taking 6 tablets daily. 07/26/20  Yes [provider]  metoprolol succinate (TOPROL-XL) 25 MG 24 hr tablet TAKE 1 TABLET BY MOUTH EVERY DAY 06/27/20  Yes [name], MD  omeprazole (PRILOSEC) 20 MG capsule Take 1 capsule (20 mg total) by mouth 2 (two) times daily before a meal. 02/25/21  Yes [name], MD  polyethylene glycol (MIRALAX / GLYCOLAX) packet Take 17 g by mouth daily.   Yes [provider]  Probiotic Product (PROBIOTIC PO) Take 1 tablet by mouth daily.   Yes [provider]  sertraline (ZOLOFT) 25 MG tablet Take 25 mg by mouth daily. 01/04/21  Yes [provider]  sodium fluoride (PREVIDENT 5000 PLUS) 1.1 % CREA dental cream Take 1 application by mouth daily.  09/15/18  Yes [provider]  SODIUM FLUORIDE 5000 PPM 1.1 % PSTE Take by mouth. 09/17/20  Yes [provider]  tizanidine (ZANAFLEX) 2 MG capsule Take 1 capsule (2 mg total) by mouth 3 (three) times daily. 02/16/18  Yes [name], MD  traZODone (DESYREL) 50 MG tablet Take 50 mg by mouth at bedtime. 04/16/21  Yes [provider]  triamcinolone cream (KENALOG) 0.1 % APPLY TO AFFECTED AREA TWICE A DAY 03/21/21  Yes [provider]  doxycycline (VIBRA-TABS) 100 MG tablet Take 1 tablet (100 mg total) by mouth 2 (two) times daily. Patient not taking: No sig reported 04/18/21   [name], MD  nitroGLYCERIN (NITROSTAT) 0.4 MG SL tablet Place 1 tablet (0.4 mg total) under the tongue every 5 (five) minutes as needed for chest pain. Maximum 3 doses 07/04/18   [name], MD  predniSONE (DELTASONE) 20 MG tablet PLEASE SEE ATTACHED FOR DETAILED DIRECTIONS Patient not taking: Reported on 05/05/2021 03/21/21   [provider]  promethazine-dextromethorphan (PROMETHAZINE-DM) 6.25-15  MG/5ML syrup Take 5 mLs by mouth 4 (four) times daily as needed for cough. 04/22/21   [name], MD  promethazine-phenylephrine 6.25-5 MG/5ML SYRP Take 5 mLs by mouth every 4 (four) hours as needed for congestion. 04/18/21   [name], MD    Allergies Azathioprine, Hydroxychloroquine, Mycophenolate mofetil, Amoxicillin, Codeine, Naprosyn [naproxen], Orudis [ketoprofen], Sulfa antibiotics, and Sulfathiazole  Family History  Problem Relation Age of Onset   Kidney disease Mother    Heart disease Mother    COPD Father    Cancer Father        esophageal   Kidney disease Sister    Cancer Brother 81       colon cancer (both brothers)   Heart attack Brother 61   Heart disease Brother    Breast cancer Neg Hx     Social History Social History   Tobacco Use   Smoking status: Never   Smokeless tobacco: Never  Vaping Use   Vaping Use: Never used  Substance Use Topics   Alcohol use: No   Drug use: No    Review of Systems  Constitutional: No fever/chills.  Positive for generalized weakness. Eyes: No visual changes. ENT: No sore throat. Cardiovascular: Denies chest pain. Respiratory: Denies shortness of breath.  Positive for productive cough Gastrointestinal: No abdominal pain.  No nausea, no vomiting.  No diarrhea.  No constipation. Genitourinary: Negative for dysuria. Musculoskeletal: Negative for back pain. Skin: Negative for rash. Neurological: Negative for headaches, focal weakness or numbness.  ____________________________________________   PHYSICAL EXAM:  VITAL SIGNS: Vitals:   05/05/21 2152 05/05/21 2152  BP:  121/61  Pulse:  97  Resp: 17 17  Temp:    SpO2:  95%    Constitutional: Alert and oriented. Well appearing and in no acute distress. Eyes: Conjunctivae are normal. PERRL. EOMI. Head: Atraumatic. Nose: No congestion/rhinnorhea. Mouth/Throat: Mucous membranes are moist.  Oropharynx non-erythematous. Neck: No stridor. No cervical spine  tenderness to palpation. Cardiovascular: Tachycardic rate, regular rhythm. Grossly normal heart sounds.  Good peripheral circulation. Respiratory: Normal respiratory effort.  No retractions. Gastrointestinal: Soft , nondistended, nontender to palpation. No CVA tenderness. Musculoskeletal: No lower extremity edema. No signs of acute trauma. Neurologic:  Normal speech and language. No gross focal neurologic deficits are appreciated. Skin:  Skin is warm, dry and intact. No rash noted. Psychiatric: Mood and affect are normal. Speech and behavior are normal. ____________________________________________   LABS (all labs ordered  are listed, but only abnormal results are displayed)  Labs Reviewed  BASIC METABOLIC PANEL - Abnormal; Notable for the following components:      Result Value   Sodium 129 (*)    Chloride 93 (*)    Glucose, Bld 103 (*)    Calcium 8.7 (*)    All other components within normal limits  CBC - Abnormal; Notable for the following components:   RBC 2.41 (*)    Hemoglobin 9.0 (*)    HCT 25.0 (*)    MCV 103.7 (*)    MCH 37.3 (*)    RDW 17.7 (*)    Platelets 138 (*)    All other components within normal limits  URINALYSIS, COMPLETE (UACMP) WITH MICROSCOPIC - Abnormal; Notable for the following components:   Color, Urine YELLOW (*)    APPearance HAZY (*)    Ketones, ur 5 (*)    All other components within normal limits  RESP PANEL BY RT-PCR (FLU A&B, COVID) ARPGX2  CULTURE, BLOOD (ROUTINE X 2)  CULTURE, BLOOD (ROUTINE X 2)  LACTIC ACID, PLASMA  LACTIC ACID, PLASMA   ____________________________________________  12 Lead EKG  Sinus tachycardia with a rate of 105 bpm.  Rightward axis and incomplete right bundle.  No evidence of acute ischemia. ____________________________________________  RADIOLOGY  ED MD interpretation: 1 view CXR with streaky opacities to right-sided lungs  Official radiology report(s): DG Chest Portable 1 View  Result Date:  05/05/2021 CLINICAL DATA:  Weakness, COVID positive EXAM: PORTABLE CHEST 1 VIEW COMPARISON:  02/09/2018 FINDINGS: Mild patchy opacities in the right upper lobe and right mid lung. Mild left basilar opacity, possibly atelectasis. No pleural effusion or pneumothorax. Partial eventration of the right hemidiaphragm. The heart is normal in size. Suspected healed right lateral clavicle fracture. IMPRESSION: Mild patchy opacities in the right lung, compatible with pneumonia in this patient with known COVID. Electronically Signed   By: Julian Hy M.D.   On: 05/05/2021 21:22    ____________________________________________   PROCEDURES and INTERVENTIONS  Procedure(s) performed (including Critical Care):  .1-3 Lead EKG Interpretation  Date/Time: 05/05/2021 11:13 PM Performed by: Vladimir Crofts, MD Authorized by: Vladimir Crofts, MD     Interpretation: abnormal     ECG rate:  101   ECG rate assessment: tachycardic     Rhythm: sinus tachycardia     Ectopy: none     Conduction: normal    Medications  cefTRIAXone (ROCEPHIN) 1 g in sodium chloride 0.9 % 100 mL IVPB (has no administration in time range)  azithromycin (ZITHROMAX) 500 mg in sodium chloride 0.9 % 250 mL IVPB (has no administration in time range)    ____________________________________________   MDM / ED COURSE   86 year old woman presents to the ED with worsening weakness, found to have evidence of pneumonia, requiring medical admission.  Tachycardic in triage, otherwise hemodynamically stable.  No hypoxia.  CXR with right-sided infiltrates concerning for pneumonia considering her progressive weakness and persistent cough.  Blood work with mild hyponatremia, and is largely unremarkable.  No leukocytosis or lactic acidosis.  No signs of sepsis.  No evidence of acute cystitis contributing to her worsening weakness.  We will start her on antibiotics and admit to medicine for further work-up and management.      ____________________________________________   FINAL CLINICAL IMPRESSION(S) / ED DIAGNOSES  Final diagnoses:  Generalized weakness  FTT (failure to thrive) in adult  Community acquired pneumonia of right middle lobe of lung     ED Discharge Orders  None        Vladimir Crofts   Note:  This document was prepared using Systems analyst and may include unintentional dictation errors.    Vladimir Crofts, MD 05/05/21 [phone]

## 2021-05-05 NOTE — Progress Notes (Signed)
PHARMACIST - PHYSICIAN ORDER COMMUNICATION  CONCERNING: P&T Medication Policy on Herbal Medications  DESCRIPTION:  This patient's order for:  Cranberry tabs  has been noted.  This product(s) is classified as an "herbal" or natural product. Due to a lack of definitive safety studies or FDA approval, nonstandard manufacturing practices, plus the potential risk of unknown drug-drug interactions while on inpatient medications, the Pharmacy and Therapeutics Committee does not permit the use of "herbal" or natural products of this type within [facility].   ACTION TAKEN: The pharmacy department is unable to verify this order at this time and your patient has been informed of this safety policy. Please reevaluate patient's clinical condition at discharge and address if the herbal or natural product(s) should be resumed at that time.

## 2021-05-05 NOTE — ED Triage Notes (Signed)
Pt to ED with daughter, reports weakness and unable to stand without assistance. Daughter reports she is usually weak in mornings but today is worse.  Daughter reports has been weak since COVID on first week of august.  Denies falls.  Daughter reports has been c/o low back pain and general aches, unsure if UTI

## 2021-05-05 NOTE — ED Triage Notes (Signed)
Pt comes via EMs with c/o weakness. Pt states she had COVID 2 weeks ago. EMS reports VSS

## 2021-05-05 NOTE — H&P (Addendum)
PATIENT NAME: Kim Santiago    MR#:  [id number]  DATE OF BIRTH:  August 17, 1935  DATE OF ADMISSION:  05/05/2021  PRIMARY CARE PHYSICIAN: [name], MD   Patient is coming from: Home  REQUESTING/REFERRING PHYSICIAN: Vladimir Crofts, MD  CHIEF COMPLAINT:   Chief Complaint  Patient presents with   Weakness    HISTORY OF PRESENT ILLNESS:  Kim Santiago is a 86 y.o. Caucasian female with medical history significant for hypertension, hypothyroidism, cutaneous lupus, coronary artery disease, Sjogren's syndrome and breast cancer status post left mastectomy, who presented to the ER with acute onset of significant generalized weakness.  Was diagnosed with COVID-19 about 4 weeks ago.  It started with URI symptoms and irritant cough and symptoms get better within a couple of weeks.  Later on her cough started getting worse with expectoration of yellowish sputum without significant dyspnea and without wheezing.  She admitted to fever and chills.  She denied any nausea or vomiting or diarrhea.  No chest pain or palpitations.  No dysuria, oliguria or hematuria or flank pain.  She has been having significant weakness that it became difficult to ambulate with a walker.  No headache or dizziness or blurred vision.  No focal muscle weakness.  The patient had 2 COVID vaccines and a booster.  ED Course: When she came to the ER heart rate was 106 and vital signs were otherwise within normal.  Labs revealed mild hyponatremia 129 with hypochloremia of 93.  Lactic acid was 1.1 and CBC showed anemia with hemoglobin 9 and hematocrit 25 compared to 11/31 on 6/21 and 10.3/30.7 on 11/28/2020.  Platelets were 138.  Urinalysis was unremarkable.  Blood culture was drawn.  COVID-19 PCR came back positive.  EKG as reviewed by me : showed sinus tachycardia with rate 105 with right axis deviation and low voltage QRS. Imaging: Portable chest ray showed mild patchy opacities in the white line compatible  with pneumonia.  The patient was given IV Rocephin and Zithromax.  She will be admitted to a medical bed for further evaluation and management. PAST MEDICAL HISTORY:   Past Medical History:  Diagnosis Date   Arthritis    Breast cancer ([place]) 2017   left mastectomy done 11/2015   Breast cancer in female [facility]) 11/18/2015   Left: 3.9 cm tumor, T2, 1/2 sentinel nodes positive for macro metastatic disease, N1, 3 negative nodes in the axillary tail, ER+,PR+, Her 2 neu, low Mammoprint score   Cancer ([facility])    thyroid takes levothyroxine   Chronic kidney disease    UTI   Genetic screening March 2017.   Mammoprint of left breast cancer: Low risk for recurrence.    History of heart attack 06/12/2014   Hypertension    hypothyroidism    secondary to thyroidectomy for thyroid ca   Hypothyroidism    Lupus ([place])    subcutaneous   Menopause 40s   natural, hot flashes and mood lability now gone, off prempro 7 months   Myocardial infarction [facility]) 2013   Osteoporosis    Osteopenia   Rosacea    Sjoegren syndrome    Stress-induced cardiomyopathy September of 2013   EF 35%. Peak troponin was 1.8.    PAST SURGICAL HISTORY:   Past Surgical History:  Procedure Laterality Date   BACK SURGERY     BREAST BIOPSY Left 10/30/15   positive, done in Dr. Dwyane Luo office   CARDIAC CATHETERIZATION  05/2012   [place].  No significant CAD. Ejection fraction of 35% due to stress-induced cardiomyopathy.   CHOLECYSTECTOMY     COLONOSCOPY     DILATION AND CURETTAGE OF UTERUS     KYPHOSIS SURGERY  Feb 2008   L1, Dr. Mauri Pole   LUMBAR DISC SURGERY     L4-L5   MASTECTOMY Left 11/18/2015   positive   SENTINEL NODE BIOPSY Left 11/18/2015   Procedure: SENTINEL NODE BIOPSY;  Surgeon: Robert Bellow, MD;  Location: [facility] ORS;  Service: General;  Laterality: Left;   SHOULDER ARTHROSCOPY  2004   Left, Dr. Jefm Bryant   SIMPLE MASTECTOMY WITH AXILLARY SENTINEL NODE BIOPSY Left 11/18/2015   Procedure: SIMPLE MASTECTOMY;   Surgeon: Robert Bellow, MD;  Location: [facility] ORS;  Service: General;  Laterality: Left;   SPINE SURGERY     L4-5 diskectomy   THYROIDECTOMY     Thyroid Cancer   TONSILLECTOMY     TUBAL LIGATION      SOCIAL HISTORY:   Social History   Tobacco Use   Smoking status: Never   Smokeless tobacco: Never  Substance Use Topics   Alcohol use: No    FAMILY HISTORY:   Family History  Problem Relation Age of Onset   Kidney disease Mother    Heart disease Mother    COPD Father    Cancer Father        esophageal   Kidney disease Sister    Cancer Brother 30       colon cancer (both brothers)   Heart attack Brother 47   Heart disease Brother    Breast cancer Neg Hx     DRUG ALLERGIES:   Allergies  Allergen Reactions   Azathioprine Nausea And Vomiting    Severe vomiting   Hydroxychloroquine Hives and Nausea And Vomiting   Mycophenolate Mofetil Nausea Only   Amoxicillin Rash    Has patient had a PCN reaction causing immediate rash, facial/tongue/throat swelling, SOB or lightheadedness with hypotension: Unknown Has patient had a PCN reaction causing severe rash involving mucus membranes or skin necrosis: Unknown Has patient had a PCN reaction that required hospitalization: Unknown Has patient had a PCN reaction occurring within the last 10 years: Unknown If all of the above answers are "NO", then may proceed with Cephalosporin use. Tolerates ceftriaxone and Ancef   Codeine Nausea And Vomiting   Naprosyn [Naproxen] Swelling   Orudis [Ketoprofen] Hives   Sulfa Antibiotics Rash   Sulfathiazole Rash    REVIEW OF SYSTEMS:   ROS As per history of present illness. All pertinent systems were reviewed above. Constitutional, HEENT, cardiovascular, respiratory, GI, GU, musculoskeletal, neuro, psychiatric, endocrine, integumentary and hematologic systems were reviewed and are otherwise negative/unremarkable except for positive findings mentioned above in the HPI.   MEDICATIONS AT  HOME:   Prior to Admission medications   Medication Sig Start Date End Date Taking? Authorizing Provider  atorvastatin (LIPITOR) 20 MG tablet TAKE 1 TABLET BY MOUTH DAILY AT 6PM 03/11/21  Yes [name], MD  Calcium Carbonate-Vitamin D 600-200 MG-UNIT TABS Take 2 tablets by mouth daily.   Yes [provider]  cholecalciferol (VITAMIN D) 1000 UNITS tablet Take 2,000 Units by mouth daily.   Yes [provider]  CRANBERRY PO Take 1 capsule by mouth 2 (two) times daily.   Yes [provider]  folic acid (FOLVITE) 1 MG tablet Take by mouth. 07/26/20 07/26/21 Yes [provider]  furosemide (LASIX) 20 MG tablet 1 TABLET EVERY OTHER DAY AS NEEDED  10/21/20  Yes [name], MD  levothyroxine (SYNTHROID) 75 MCG tablet TAKE 1 TABLET BY MOUTH (75MCG TOTAL) DAILY 04/14/21  Yes [name], MD  losartan (COZAAR) 25 MG tablet TAKE 1 TABLET BY MOUTH EVERY DAY 11/13/20  Yes [name], MD  methotrexate (RHEUMATREX) 2.5 MG tablet Take 15 mg by mouth once a week. Pt is taking 6 tablets daily. 07/26/20  Yes [provider]  metoprolol succinate (TOPROL-XL) 25 MG 24 hr tablet TAKE 1 TABLET BY MOUTH EVERY DAY 06/27/20  Yes [name], MD  omeprazole (PRILOSEC) 20 MG capsule Take 1 capsule (20 mg total) by mouth 2 (two) times daily before a meal. 02/25/21  Yes [name], MD  polyethylene glycol (MIRALAX / GLYCOLAX) packet Take 17 g by mouth daily.   Yes [provider]  Probiotic Product (PROBIOTIC PO) Take 1 tablet by mouth daily.   Yes [provider]  sertraline (ZOLOFT) 25 MG tablet Take 25 mg by mouth daily. 01/04/21  Yes [provider]  sodium fluoride (PREVIDENT 5000 PLUS) 1.1 % CREA dental cream Take 1 application by mouth daily.  09/15/18  Yes [provider]  SODIUM FLUORIDE 5000 PPM 1.1 % PSTE Take by mouth. 09/17/20  Yes [provider]  tizanidine (ZANAFLEX) 2 MG capsule Take 1 capsule (2 mg total)  by mouth 3 (three) times daily. 02/16/18  Yes [name], MD  traZODone (DESYREL) 50 MG tablet Take 50 mg by mouth at bedtime. 04/16/21  Yes [provider]  triamcinolone cream (KENALOG) 0.1 % APPLY TO AFFECTED AREA TWICE A DAY 03/21/21  Yes [provider]  doxycycline (VIBRA-TABS) 100 MG tablet Take 1 tablet (100 mg total) by mouth 2 (two) times daily. Patient not taking: No sig reported 04/18/21   [name], MD  nitroGLYCERIN (NITROSTAT) 0.4 MG SL tablet Place 1 tablet (0.4 mg total) under the tongue every 5 (five) minutes as needed for chest pain. Maximum 3 doses 07/04/18   [name], MD  predniSONE (DELTASONE) 20 MG tablet PLEASE SEE ATTACHED FOR DETAILED DIRECTIONS Patient not taking: Reported on 05/05/2021 03/21/21   [provider]  promethazine-dextromethorphan (PROMETHAZINE-DM) 6.25-15 MG/5ML syrup Take 5 mLs by mouth 4 (four) times daily as needed for cough. 04/22/21   [name], MD  promethazine-phenylephrine 6.25-5 MG/5ML SYRP Take 5 mLs by mouth every 4 (four) hours as needed for congestion. 04/18/21   [name], MD      VITAL SIGNS:  Blood pressure 121/61, pulse 97, temperature 99.8 F (37.7 C), temperature source Oral, resp. rate 17, height '5\' 3"'$  (1.6 m), weight 65 kg, SpO2 95 %.  PHYSICAL EXAMINATION:  Physical Exam  GENERAL:  86 y.o.-year-old Caucasian female patient lying in the bed with no acute distress.  EYES: Pupils equal, round, reactive to light and accommodation. No scleral icterus. Extraocular muscles intact.  HEENT: Head atraumatic, normocephalic. Oropharynx and nasopharynx clear.  NECK:  Supple, no jugular venous distention. No thyroid enlargement, no tenderness.  LUNGS: Diminished bibasal breath sounds with right basal/mid lung zone crackles. CARDIOVASCULAR: Regular rate and rhythm, S1, S2 normal. No murmurs, rubs, or gallops.  ABDOMEN: Soft, nondistended, nontender. Bowel sounds present. No organomegaly or mass.   EXTREMITIES: No pedal edema, cyanosis, or clubbing.  NEUROLOGIC: Cranial nerves II through XII are intact. Muscle strength 5/5 in all extremities. Sensation intact. Gait not checked.  PSYCHIATRIC: The patient is alert and oriented x 3.  Normal affect and  good eye contact. SKIN: No obvious rash, lesion, or ulcer.   LABORATORY PANEL:   CBC Recent Labs  Lab 05/05/21 1734  WBC 4.1  HGB 9.0*  HCT 25.0*  PLT 138*   ------------------------------------------------------------------------------------------------------------------  Chemistries  Recent Labs  Lab 05/05/21 1734  NA 129*  K 4.4  CL 93*  CO2 29  GLUCOSE 103*  BUN 20  CREATININE 0.92  CALCIUM 8.7*   ------------------------------------------------------------------------------------------------------------------  Cardiac Enzymes No results for input(s): TROPONINI in the last 168 hours. ------------------------------------------------------------------------------------------------------------------  RADIOLOGY:  DG Chest Portable 1 View  Result Date: 05/05/2021 CLINICAL DATA:  Weakness, COVID positive EXAM: PORTABLE CHEST 1 VIEW COMPARISON:  02/09/2018 FINDINGS: Mild patchy opacities in the right upper lobe and right mid lung. Mild left basilar opacity, possibly atelectasis. No pleural effusion or pneumothorax. Partial eventration of the right hemidiaphragm. The heart is normal in size. Suspected healed right lateral clavicle fracture. IMPRESSION: Mild patchy opacities in the right lung, compatible with pneumonia in this patient with known COVID. Electronically Signed   By: Julian Hy M.D.   On: 05/05/2021 21:22      IMPRESSION AND PLAN:  Active Problems:   CAP (community acquired pneumonia)  1.  Right-sided likely bacterial community-acquired pneumonia with recent diagnosis of COVID-19 4 weeks ago.  The patient is still positive for COVID-19. - The patient will be admitted to a medical bed. - We will  continue antibiotic therapy with IV Rocephin and Zithromax. - Mucolytic therapy will be provided as well as as needed DuoNebs. - We will follow sputum and blood cultures. - I do not believe that her pneumonia is due to COVID-19 and it has been 4 weeks since she had it.  I do not believe that she needs isolation at this time.  2.  Generalized weakness and failure to thrive. - This could be secondary to #1 and subsequent to COVID-19. - Physical therapy consult to be obtained.  3.  Dyslipidemia. - We will continue statin therapy.  4.  Cutaneous discoid lupus and Sjogren's syndrome. - We will continue her methotrexate.  5.  Hypothyroidism. - We will continue Synthroid.  6.  Essential hypertension. - We will continue her Cozaar and Toprol-XL.  7.  GERD. - We will continue PPI therapy.  8.  Depression. - We will continue Zoloft.  9.  Coronary artery disease. - We will continue Toprol-XL and as needed sublingual nitroglycerin.  DVT prophylaxis: Lovenox. Code Status: full code. Family Communication:  The plan of care was discussed in details with the patient (and her daughter who was with her in the room.). I answered all questions. The patient agreed to proceed with the above mentioned plan. Further management will depend upon hospital course. Disposition Plan: Back to previous home environment Consults called: none. All the records are reviewed and case discussed with ED provider.  Status is: Inpatient  Remains inpatient appropriate because:Ongoing diagnostic testing needed not appropriate for outpatient work up, Unsafe d/c plan, IV treatments appropriate due to intensity of illness or inability to take PO, and Inpatient level of care appropriate due to severity of illness  Dispo: The patient is from: Home              Anticipated d/c is to: Home              Patient currently is not medically stable to d/c.   Difficult to place patient No  TOTAL TIME TAKING CARE OF THIS  PATIENT: 55 minutes.    Samanvitha Germany  Alfredo Martinez M.D on 05/05/2021 at 11:21 PM  [facility]   From 7 PM-7 AM, contact night-coverage [email or web address]  CC: Primary care physician; [name], MD

## 2021-05-06 LAB — CBC
HCT: 23.4 % — ABNORMAL LOW (ref 36.0–46.0)
Hemoglobin: 8.3 g/dL — ABNORMAL LOW (ref 12.0–15.0)
MCH: 37.6 pg — ABNORMAL HIGH (ref 26.0–34.0)
MCHC: 35.5 g/dL (ref 30.0–36.0)
MCV: 105.9 fL — ABNORMAL HIGH (ref 80.0–100.0)
Platelets: 121 10*3/uL — ABNORMAL LOW (ref 150–400)
RBC: 2.21 MIL/uL — ABNORMAL LOW (ref 3.87–5.11)
RDW: 17.9 % — ABNORMAL HIGH (ref 11.5–15.5)
WBC: 3.2 10*3/uL — ABNORMAL LOW (ref 4.0–10.5)
nRBC: 0 % (ref 0.0–0.2)

## 2021-05-06 LAB — BASIC METABOLIC PANEL
Anion gap: 8 (ref 5–15)
BUN: 15 mg/dL (ref 8–23)
CO2: 26 mmol/L (ref 22–32)
Calcium: 8 mg/dL — ABNORMAL LOW (ref 8.9–10.3)
Chloride: 96 mmol/L — ABNORMAL LOW (ref 98–111)
Creatinine, Ser: 0.76 mg/dL (ref 0.44–1.00)
GFR, Estimated: >60 mL/min (ref >60)
Glucose, Bld: 91 mg/dL (ref 70–99)
Potassium: 3.8 mmol/L (ref 3.5–5.1)
Sodium: 130 mmol/L — ABNORMAL LOW (ref 135–145)

## 2021-05-06 MED ORDER — METHOTREXATE 2.5 MG PO TABS
15.0000 mg | ORAL_TABLET | ORAL | Status: DC
Start: 2021-05-11 — End: 2021-05-07

## 2021-05-06 MED ORDER — TIZANIDINE HCL 2 MG PO TABS
2.0000 mg | ORAL_TABLET | Freq: Three times a day (TID) | ORAL | Status: DC | PRN
  Filled 2021-05-06: qty 1

## 2021-05-06 MED ORDER — METHOTREXATE 2.5 MG PO TABS: 15.0000 mg | ORAL_TABLET | ORAL | Status: DC

## 2021-05-06 NOTE — ED Notes (Signed)
Pt sleeping. 

## 2021-05-06 NOTE — Progress Notes (Signed)
PROGRESS NOTE    Kim Santiago   [id number]  DOB: 1934/11/10  PCP: [name], MD    DOA: 05/05/2021 LOS: 1    Brief Narrative / Hospital Course to Date:   86 y.o. female with PMHx of HTN, hypothyroidism, cutaneous lupus, CAD, Sjogren's syndrome and breast cancer status post left mastectomy, who presented to the ER with worsening generalized weakness in the setting of Covid-19 infection, diagnosed 4 weeks ago.  It started with URI symptoms, had a  cough that improved within a couple of weeks.  Later on her cough started getting worse and more productive.  She became progressively weak and unable to ambulate with her walker.  Admitted and started on IV antibiotics for bacterial pneumonia, Covid-specific treatments deferred given diagnosis a month ago.  Assessment & Plan   Active Problems:   CAP (community acquired pneumonia)  Community-acquired bacterial pneumonia secondary to recent COVID-19 infection - still positive for COVID-19. Continue empiric IV Rocephin and Zithromax. Duonebs, mucolytics. Follow sputum and blood cultures Pneumonia this far out from Covid diagnosis more likely a secondary bacterial process. No need for isolation precautions with diagnosis 4 weeks PTA. Monitor O2 and supplement if spO2<90% Stop IV fluids.  Hyponatremia, chronic - POA with Na 129.   Per chart review, appears baseline is low 130's Likely hypovolemic. Started on IV fluids on admission. Sodium improving. D/C fluids as pt eating and drinking better. Monitor BMP.  Thrombocytopenia - POA, possibly due to infection but on chart review, pt's platelet count chronically low.   Follow CBC.  Generalized weakness and failure to thrive. Due to prolonged course of illness over past month. PT and OT evaluations.   Dyslipidemia - on statin  Cutaneous discoid lupus and Sjogren's syndrome - continue methotrexate  Hypothyroidism - continue Synthroid.  Essential hypertension -  continue Cozaar and Toprol-XL  GERD - continue PPI   Depression - continue Zoloft  Coronary artery disease - continue Toprol-XL, PRN nitro   Patient BMI: Body mass index is 25.38 kg/m.   DVT prophylaxis: enoxaparin (LOVENOX) injection 40 mg Start: 05/05/21 2330   Diet:  Diet Orders (From admission, onward)     Start     Ordered   05/05/21 2319  Diet Heart Room service appropriate? Yes; Fluid consistency: Thin  Diet effective now       Question Answer Comment  Room service appropriate? Yes   Fluid consistency: Thin      05/05/21 2320              Code Status: DNR   Subjective 05/06/21    Pt seen in ED on hold for a bed, daughter at bedside.  She reports feeling slightly better.  Does not use O2 at home, on 2 L/min now.  Daughter reports pt's been told of sodium running a bit low.  Patient endorses profound weakness, unable to tolerating standing up.  No fever/chills, CP, or SOB at rest.  No N/V/D.   Disposition Plan & Communication   Status is: Inpatient  Remains inpatient appropriate because:IV treatments appropriate due to intensity of illness or inability to take PO  Dispo: The patient is from: Home              Anticipated d/c is to: Home              Patient currently is not medically stable to d/c.   Difficult to place patient No  Family Communication: daughter at bedside on rounds today  Consults, Procedures, Significant Events   Consultants:  None  Procedures:  None   Antimicrobials:  Anti-infectives (From admission, onward)    Start     Dose/Rate Route Frequency Ordered Stop   05/05/21 2330  cefTRIAXone (ROCEPHIN) 2 g in sodium chloride 0.9 % 100 mL IVPB        2 g 200 mL/hr over 30 Minutes Intravenous Every 24 hours 05/05/21 2320 05/10/21 2329   05/05/21 2330  azithromycin (ZITHROMAX) 500 mg in sodium chloride 0.9 % 250 mL IVPB        500 mg 250 mL/hr over 60 Minutes Intravenous Every 24 hours 05/05/21 2320 05/10/21 2329   05/05/21 2230   cefTRIAXone (ROCEPHIN) 1 g in sodium chloride 0.9 % 100 mL IVPB  Status:  Discontinued        1 g 200 mL/hr over 30 Minutes Intravenous  Once 05/05/21 2226 05/05/21 2359   05/05/21 2230  azithromycin (ZITHROMAX) 500 mg in sodium chloride 0.9 % 250 mL IVPB  Status:  Discontinued        500 mg 250 mL/hr over 60 Minutes Intravenous  Once 05/05/21 2226 05/05/21 2324         Micro    Objective   Vitals:   05/06/21 1559 05/06/21 1600 05/06/21 1601 05/06/21 1803  BP:    125/62  Pulse:  99  (!) 108  Resp:   19 16  Temp: 98.9 F (37.2 C)   97.8 F (36.6 C)  TempSrc: Oral     SpO2:  100%  99%  Weight:      Height:        Intake/Output Summary (Last 24 hours) at 05/06/2021 1810 Last data filed at 05/06/2021 1023 Gross per 24 hour  Intake 1250 ml  Output --  Net 1250 ml   Filed Weights   05/05/21 1731  Weight: 65 kg    Physical Exam:  General exam: awake, alert, no acute distress, mildly ill-appearing Respiratory system: right crackles, no wheezes, normal respiratory effort, on 2 L/min [place] O2. Cardiovascular system: normal S1/S2, RRR, 2+ b/l LE edema.   Gastrointestinal system: soft, NT, ND Central nervous system: A&O x4. no gross focal neurologic deficits, normal speech Skin: dry, intact, normal temperature Psychiatry: normal mood, congruent affect, judgement and insight appear normal  Labs   Data Reviewed: I have personally reviewed following labs and imaging studies  CBC: Recent Labs  Lab 05/05/21 1734 05/06/21 0522  WBC 4.1 3.2*  HGB 9.0* 8.3*  HCT 25.0* 23.4*  MCV 103.7* 105.9*  PLT 138* 123XX123*   Basic Metabolic Panel: Recent Labs  Lab 05/05/21 1734 05/06/21 0522  NA 129* 130*  K 4.4 3.8  CL 93* 96*  CO2 29 26  GLUCOSE 103* 91  BUN 20 15  CREATININE 0.92 0.76  CALCIUM 8.7* 8.0*   GFR: Estimated Creatinine Clearance: 47.4 mL/min (by C-G formula based on SCr of 0.76 mg/dL). Liver Function Tests: No results for input(s): AST, ALT, ALKPHOS, BILITOT,  PROT, ALBUMIN in the last 168 hours. No results for input(s): LIPASE, AMYLASE in the last 168 hours. No results for input(s): AMMONIA in the last 168 hours. Coagulation Profile: No results for input(s): INR, PROTIME in the last 168 hours. Cardiac Enzymes: No results for input(s): CKTOTAL, CKMB, CKMBINDEX, TROPONINI in the last 168 hours. BNP (last 3 results) No results for input(s): PROBNP in the last 8760 hours. HbA1C: No results for input(s): HGBA1C in the last 72 hours. CBG: No results for input(s): GLUCAP  in the last 168 hours. Lipid Profile: No results for input(s): CHOL, HDL, LDLCALC, TRIG, CHOLHDL, LDLDIRECT in the last 72 hours. Thyroid Function Tests: No results for input(s): TSH, T4TOTAL, FREET4, T3FREE, THYROIDAB in the last 72 hours. Anemia Panel: No results for input(s): VITAMINB12, FOLATE, FERRITIN, TIBC, IRON, RETICCTPCT in the last 72 hours. Sepsis Labs: Recent Labs  Lab 05/05/21 2149 05/05/21 2317  LATICACIDVEN 1.1 1.2    Recent Results (from the past 240 hour(s))  Resp Panel by RT-PCR (Flu A&B, Covid) Nasopharyngeal Swab     Status: Abnormal   Collection Time: 05/05/21  9:49 PM   Specimen: Nasopharyngeal Swab; Nasopharyngeal(NP) swabs in vial transport medium  Result Value Ref Range Status   SARS Coronavirus 2 by RT PCR POSITIVE (A) NEGATIVE Final    Comment: RESULT CALLED TO, READ BACK BY AND VERIFIED WITH: SANTIA BULLOCK'@2330'$  05/05/21 RH (NOTE) SARS-CoV-2 target nucleic acids are DETECTED.  The SARS-CoV-2 RNA is generally detectable in upper respiratory specimens during the acute phase of infection. Positive results are indicative of the presence of the identified virus, but do not rule out bacterial infection or co-infection with other pathogens not detected by the test. Clinical correlation with patient history and other diagnostic information is necessary to determine patient infection status. The expected result is Negative.  Fact Sheet for  Patients: EntrepreneurPulse.com.au  Fact Sheet for Healthcare Providers: IncredibleEmployment.be  This test is not yet approved or cleared by the [place] FDA and  has been authorized for detection and/or diagnosis of SARS-CoV-2 by FDA under an Emergency Use Authorization (EUA).  This EUA will remain in effect (meaning this test can be [place] ed) for the duration of  the COVID-19 declaration under Section 564(b)(1) of the Act, 21 U.S.C. section 360bbb-3(b)(1), unless the authorization is terminated or revoked sooner.     Influenza A by PCR NEGATIVE NEGATIVE Final   Influenza B by PCR NEGATIVE NEGATIVE Final    Comment: (NOTE) The Xpert Xpress SARS-CoV-2/FLU/RSV plus assay is intended as an aid in the diagnosis of influenza from Nasopharyngeal swab specimens and should not be used as a sole basis for treatment. Nasal washings and aspirates are unacceptable for Xpert Xpress SARS-CoV-2/FLU/RSV testing.  Fact Sheet for Patients: EntrepreneurPulse.com.au  Fact Sheet for Healthcare Providers: IncredibleEmployment.be  This test is not yet approved or cleared by the [place] FDA and has been authorized for detection and/or diagnosis of SARS-CoV-2 by FDA under an Emergency Use Authorization (EUA). This EUA will remain in effect (meaning this test can be used) for the duration of the COVID-19 declaration under Section 564(b)(1) of the Act, 21 U.S.C. section 360bbb-3(b)(1), unless the authorization is terminated or revoked.  Performed at [facility], [address] [place], [place] [address]   Blood culture (routine x 2)     Status: None (Preliminary result)   Collection Time: 05/05/21 10:18 PM   Specimen: BLOOD  Result Value Ref Range Status   Specimen Description BLOOD RIGHT HAND  Final   Special Requests   Final    BOTTLES DRAWN AEROBIC AND ANAEROBIC Blood Culture adequate volume   Culture    Final    NO GROWTH < 12 HOURS Performed at [facility], [address] [place], [place] [address]    Report Status PENDING  Incomplete  Blood culture (routine x 2)     Status: None (Preliminary result)   Collection Time: 05/05/21 11:17 PM   Specimen: BLOOD  Result Value Ref Range Status   Specimen  Description BLOOD RIGHT ARM  Final   Special Requests   Final    BOTTLES DRAWN AEROBIC AND ANAEROBIC Blood Culture adequate volume   Culture   Final    NO GROWTH < 12 HOURS Performed at [facility], [address] [place], [place] [address]    Report Status PENDING  Incomplete      Imaging Studies   DG Chest Portable 1 View  Result Date: 05/05/2021 CLINICAL DATA:  Weakness, COVID positive EXAM: PORTABLE CHEST 1 VIEW COMPARISON:  02/09/2018 FINDINGS: Mild patchy opacities in the right upper lobe and right mid lung. Mild left basilar opacity, possibly atelectasis. No pleural effusion or pneumothorax. Partial eventration of the right hemidiaphragm. The heart is normal in size. Suspected healed right lateral clavicle fracture. IMPRESSION: Mild patchy opacities in the right lung, compatible with pneumonia in this patient with known COVID. Electronically Signed   By: Julian Hy M.D.   On: 05/05/2021 21:22     Medications   Scheduled Meds:  acidophilus  1 capsule Oral Daily   atorvastatin  20 mg Oral Daily   calcium-vitamin D  2 tablet Oral Daily   cholecalciferol  2,000 Units Oral Daily   enoxaparin (LOVENOX) injection  40 mg Subcutaneous [id number]   folic acid  1 mg Oral Daily   guaiFENesin  600 mg Oral BID   levothyroxine  75 mcg Oral Q0600   losartan  25 mg Oral Daily   [START ON 05/11/2021] methotrexate  15 mg Oral Q Sun   metoprolol succinate  25 mg Oral Daily   pantoprazole  40 mg Oral Daily   polyethylene glycol  17 g Oral Daily   sertraline  25 mg Oral Daily   traZODone  50 mg Oral QHS   Continuous Infusions:  sodium chloride 100 mL/hr at 05/06/21  1024   azithromycin Stopped (05/06/21 0125)   cefTRIAXone (ROCEPHIN)  IV Stopped (05/06/21 0002)       LOS: 1 day    Time spent: 30 minutes    Ezekiel Slocumb, DO [facility]  05/06/2021, 6:10 PM      If 7PM-7AM, please contact night-coverage. How to contact the [facility] Attending or Consulting provider [place] or covering provider during after hours [place], for this patient?    Check the care team in [facility] and look for a) attending/consulting [facility] provider listed and b) the [facility] team listed Log into [email or web address] and use [place]'s universal password to access. If you do not have the password, please contact the hospital operator. Locate the [facility] provider you are looking for under [facility] and page to a number that you can be directly reached. If you still have difficulty reaching the provider, please page the [facility] (Director on Call) for the Hospitalists listed on amion for assistance.

## 2021-05-06 NOTE — ED Notes (Signed)
Pt assisted to [facility] & back to bed. Pts HR increased to 130's with exertion. RN remained with pt while pt used BSC. Pt changed into hospital gown & repositioned for comfort. No additional needs verbalized at this time. Bed low & locked, call light within reach. Family at [facility].

## 2021-05-07 LAB — RESPIRATORY PANEL BY PCR

## 2021-05-07 LAB — BASIC METABOLIC PANEL
Anion gap: 5 (ref 5–15)
BUN: 15 mg/dL (ref 8–23)
CO2: 26 mmol/L (ref 22–32)
Calcium: 7.6 mg/dL — ABNORMAL LOW (ref 8.9–10.3)
Chloride: 99 mmol/L (ref 98–111)
Creatinine, Ser: 0.67 mg/dL (ref 0.44–1.00)
GFR, Estimated: >60 mL/min (ref >60)
Glucose, Bld: 94 mg/dL (ref 70–99)
Potassium: 3.9 mmol/L (ref 3.5–5.1)
Sodium: 130 mmol/L — ABNORMAL LOW (ref 135–145)

## 2021-05-07 LAB — CBC
HCT: 20.4 % — ABNORMAL LOW (ref 36.0–46.0)
Hemoglobin: 7.2 g/dL — ABNORMAL LOW (ref 12.0–15.0)
MCH: 38.5 pg — ABNORMAL HIGH (ref 26.0–34.0)
MCHC: 35.3 g/dL (ref 30.0–36.0)
MCV: 109.1 fL — ABNORMAL HIGH (ref 80.0–100.0)
Platelets: 131 10*3/uL — ABNORMAL LOW (ref 150–400)
RBC: 1.87 MIL/uL — ABNORMAL LOW (ref 3.87–5.11)
RDW: 18.1 % — ABNORMAL HIGH (ref 11.5–15.5)
WBC: 2.8 10*3/uL — ABNORMAL LOW (ref 4.0–10.5)
nRBC: 0 % (ref 0.0–0.2)

## 2021-05-07 LAB — EXPECTORATED SPUTUM ASSESSMENT W GRAM STAIN, RFLX TO RESP C

## 2021-05-07 LAB — PROCALCITONIN: Procalcitonin: 0.1 ng/mL

## 2021-05-07 LAB — MRSA NEXT GEN BY PCR, NASAL: MRSA by PCR Next Gen: NOT DETECTED

## 2021-05-07 LAB — MAGNESIUM: Magnesium: 1.9 mg/dL (ref 1.7–2.4)

## 2021-05-07 MED ORDER — SODIUM CHLORIDE 0.9 % IV SOLN: INTRAVENOUS | Status: DC

## 2021-05-07 MED ORDER — IBUPROFEN 400 MG PO TABS
400.0000 mg | ORAL_TABLET | Freq: Once | ORAL | Status: AC
  Administered 2021-05-07: 400 mg via ORAL
  Filled 2021-05-07: qty 1

## 2021-05-07 NOTE — Progress Notes (Signed)
Noted anemia progressing today.  Firstly, her Sjogrens is quiescent, not symptomatic/active.  CLE is active, evidently first diagnosed with cutaneous lupus in 2017, at which time, per derm notes: "Patient describes a 4-5 year history of a rash on the extremities and upper back.  Clinical suspicion was that of subacute cutaneous lupus; biopsy was performed and was confirmatory.  She was started on triamcinolone 0.1% ointment, but given considerable BSA involved, topicals were not felt to be a realistic option.  Patient has ultimately failed treatment with systemic agents: plaquenil (side effect of hives), methotrexate (lack of efficacy), Cellcept (GI distress), and most recently Imuran (marked improvement of her skin, but vomiting after about 1 month)."     More recently, the patient flared up this year, was restarted on Methotrexate in July, then had the dose increased.    Here, with worsening macrocytic anemia, fatigue, fever.

## 2021-05-07 NOTE — Progress Notes (Signed)
   05/07/21 1506  Assess: MEWS Score  Temp (!) 101.2 F (38.4 C)  BP (!) 92/43 (MAP of 58)  Pulse Rate (!) 106  Resp 17  Level of Consciousness Alert  SpO2 96 %  O2 Device Nasal Cannula  O2 Flow Rate (L/min) 2 L/min  Assess: MEWS Score  MEWS Temp 1  MEWS Systolic 1  MEWS Pulse 1  MEWS RR 0  MEWS LOC 0  MEWS Score 3  MEWS Score Color Yellow  Assess: if the MEWS score is Yellow or Red  Were vital signs taken at a resting state? Yes  Focused Assessment Change from prior assessment (see assessment flowsheet)  MEWS guidelines implemented *See Row Information* Yes  Treat  MEWS Interventions Escalated (See documentation below)  Pain Scale 0-10  Pain Score 0  Take Vital Signs  Increase Vital Sign Frequency  Yellow: Q 2hr X 2 then Q 4hr X 2, if remains yellow, continue Q 4hrs  Escalate  MEWS: Escalate Yellow: discuss with charge nurse/RN and consider discussing with provider and RRT  Notify: Charge Nurse/RN  Name of Charge Nurse/RN Notified Maurene Capes  Date Charge Nurse/RN Notified 05/07/21  Time Charge Nurse/RN Notified [id number]  Notify: Provider  Provider Name/Title Dr  Loleta Books  Date Provider Notified 05/07/21  Time Provider Notified 1456  Notification Type  (text)  Notification Reason Change in status  Provider response See new orders;Other (Comment) (MD also is going to come by   and see patient)  Date of Provider Response 05/07/21  Time of Provider Response 1506  Assess: SIRS CRITERIA  SIRS Temperature  1  SIRS Pulse 1  SIRS Respirations  0  SIRS WBC 1  SIRS Score Sum  3

## 2021-05-07 NOTE — Progress Notes (Signed)
Kim Santiago PROGRESS NOTE    Kim Santiago  MRN:[id number] DOB: 02/17/1935 DOA: 05/05/2021 PCP: Tullo, Teresa L, MD      Brief Narrative:  Kim Santiago is a 86 y.o. F with Sjogrens and cutaneous lupus on MTX, HTN, hypothytrodism, anemia, hx BrCA with mastectomy who presented with generalized weakness.  In the ER, HR 106, and CXR showed patchy pneumonia.  Admitted for treatment of pneumonia.         Assessment & Plan:  Community-acquired pneumonia right upper and middle lobes PSI 134, risk class 5, still fevering and intermittently confused today.  - Obtain sputum - Check MRSA nares - Follow cultures - Continue Rocephin and Zithromax - Flutter and IS - Hold Methotrexate   Recent COVID No active infection.  Infected one month ago, this is resolved.  Acute on chronic anemia CHronic anemia ~10 g/dL, probably from MTX.   Here, acutely worse.  No bleeding. - Check haptoglobin, LDH, bili, smear -Continue folate -Hold MTX  Leukopenia Due to MTX likely.  Will monitor - Trend CBC  Hyponatremia Stable, asymptomatic  Sjogren's syndrome and lupus cutaneous -Hold MTX  History of breast cancer  Hypothyroidism -Continue levothyorinxe  Hypertension Coronary disease -Continue atorvastatin -Continue losartant, metoprolol -Hold Lasix   Mood disorder -Continue sertraline, trazodone  GERD -Continue PPI            Disposition: Status is: Inpatient  Remains inpatient appropriate because:Inpatient level of care appropriate due to severity of illness  Dispo: The patient is from: Home              Anticipated d/c is to: SNF              Patient currently is not medically stable to d/c.   Difficult to place patient No       Level of care: Med-Surg       MDM: The below labs and imaging reports were reviewed and summarized above.  Medication management as above.    DVT prophylaxis: enoxaparin (LOVENOX) injection 40 mg  Start: 05/05/21 2330  Code Status: DNR Family Communication: daughter              Subjective: She is little bit confused with her fever this afternoon.  No headache.  No chest pain.  She still has a cough.  She is still very weak.  Objective: Vitals:   05/07/21 1304 05/07/21 1447 05/07/21 1500 05/07/21 1506  BP: (!) 117/57     Pulse: 99   (!) 106  Resp:      Temp: (!) 100.8 F (38.2 C) (!) 101.2 F (38.4 C)    TempSrc: Oral Oral    SpO2: 92%  (!) 88% 96%  Weight:      Height:        Intake/Output Summary (Last 24 hours) at 05/07/2021 1520 Last data filed at 05/07/2021 1402 Gross per 24 hour  Intake 590 ml  Output --  Net 590 ml   Filed Weights   05/05/21 1731  Weight: 65 kg    Examination: General appearance:  adult female, alert and in no distress.  No obvious HEENT: Anicteric, conjunctiva pink, lids and lashes normal. No nasal deformity, discharge, epistaxis.  Lips moist.   Skin: Warm and dry.  no jaundice.  No suspicious rashes or lesions. Cardiac: RRR, nl S1-S2, no murmurs appreciated.  Capillary refill is brisk.  JVP normal No LE edema.  Radial  pulses 2+ and symmetric. Respiratory: Normal respiratory rate and   rhythm.  Rales on the left Abdomen: Abdomen soft.  no TTP. No ascites, distension, hepatosplenomegaly.   MSK: No deformities or effusions. Neuro: Awake and alert.  EOMI, moves all extremities with severe generalized weakness. Speech fluent.    Psych: Sensorium intact and responding to questions, much, affect blunted, judgment insight appear impaired      Data Reviewed: I have personally reviewed following labs and imaging studies:  CBC: Recent Labs  Lab 05/05/21 1734 05/06/21 0522 05/07/21 0551  WBC 4.1 3.2* 2.8*  HGB 9.0* 8.3* 7.2*  HCT 25.0* 23.4* 20.4*  MCV 103.7* 105.9* 109.1*  PLT 138* 121* 131*   Basic Metabolic Panel: Recent Labs  Lab 05/05/21 1734 05/06/21 0522 05/07/21 0551  NA 129* 130* 130*  K 4.4 3.8 3.9  CL 93*  96* 99  CO2 29 26 26  GLUCOSE 103* 91 94  BUN 20 15 15  CREATININE 0.92 0.76 0.67  CALCIUM 8.7* 8.0* 7.6*  MG  --   --  1.9   GFR: Estimated Creatinine Clearance: 47.4 mL/min (by C-G formula based on SCr of 0.67 mg/dL). Liver Function Tests: No results for input(s): AST, ALT, ALKPHOS, BILITOT, PROT, ALBUMIN in the last 168 hours. No results for input(s): LIPASE, AMYLASE in the last 168 hours. No results for input(s): AMMONIA in the last 168 hours. Coagulation Profile: No results for input(s): INR, PROTIME in the last 168 hours. Cardiac Enzymes: No results for input(s): CKTOTAL, CKMB, CKMBINDEX, TROPONINI in the last 168 hours. BNP (last 3 results) No results for input(s): PROBNP in the last 8760 hours. HbA1C: No results for input(s): HGBA1C in the last 72 hours. CBG: No results for input(s): GLUCAP in the last 168 hours. Lipid Profile: No results for input(s): CHOL, HDL, LDLCALC, TRIG, CHOLHDL, LDLDIRECT in the last 72 hours. Thyroid Function Tests: No results for input(s): TSH, T4TOTAL, FREET4, T3FREE, THYROIDAB in the last 72 hours. Anemia Panel: No results for input(s): VITAMINB12, FOLATE, FERRITIN, TIBC, IRON, RETICCTPCT in the last 72 hours. Urine analysis:    Component Value Date/Time   COLORURINE YELLOW (A) 05/05/2021 2149   APPEARANCEUR HAZY (A) 05/05/2021 2149   LABSPEC 1.016 05/05/2021 2149   PHURINE 6.0 05/05/2021 2149   GLUCOSEU NEGATIVE 05/05/2021 2149   GLUCOSEU NEGATIVE 05/11/2017 1143   HGBUR NEGATIVE 05/05/2021 2149   BILIRUBINUR NEGATIVE 05/05/2021 2149   BILIRUBINUR negative 07/04/2018 1134   KETONESUR 5 (A) 05/05/2021 2149   PROTEINUR NEGATIVE 05/05/2021 2149   UROBILINOGEN 0.2 07/04/2018 1134   UROBILINOGEN 0.2 05/11/2017 1143   NITRITE NEGATIVE 05/05/2021 2149   LEUKOCYTESUR NEGATIVE 05/05/2021 2149   Sepsis Labs: @LABRCNTIP(procalcitonin:4,lacticacidven:4)  ) Recent Results (from the past 240 hour(s))  Resp Panel by RT-PCR (Flu A&B, Covid)  Nasopharyngeal Swab     Status: Abnormal   Collection Time: 05/05/21  9:49 PM   Specimen: Nasopharyngeal Swab; Nasopharyngeal(NP) swabs in vial transport medium  Result Value Ref Range Status   SARS Coronavirus 2 by RT PCR POSITIVE (A) NEGATIVE Final    Comment: RESULT CALLED TO, READ BACK BY AND VERIFIED WITH: SANTIA BULLOCK@2330 05/05/21 RH (NOTE) SARS-CoV-2 target nucleic acids are DETECTED.  The SARS-CoV-2 RNA is generally detectable in upper respiratory specimens during the acute phase of infection. Positive results are indicative of the presence of the identified virus, but do not rule out bacterial infection or co-infection with other pathogens not detected by the test. Clinical correlation with patient history and other diagnostic information is necessary to determine patient infection status. The expected   result is Negative.  Fact Sheet for Patients: EntrepreneurPulse.com.au  Fact Sheet for Healthcare Providers: IncredibleEmployment.be  This test is not yet approved or cleared by the [place] FDA and  has been authorized for detection and/or diagnosis of SARS-CoV-2 by FDA under an Emergency Use Authorization (EUA).  This EUA will remain in effect (meaning this test can be [place] ed) for the duration of  the COVID-19 declaration under Section 564(b)(1) of the Act, 21 U.S.C. section 360bbb-3(b)(1), unless the authorization is terminated or revoked sooner.     Influenza A by PCR NEGATIVE NEGATIVE Final   Influenza B by PCR NEGATIVE NEGATIVE Final    Comment: (NOTE) The Xpert Xpress SARS-CoV-2/FLU/RSV plus assay is intended as an aid in the diagnosis of influenza from Nasopharyngeal swab specimens and should not be used as a sole basis for treatment. Nasal washings and aspirates are unacceptable for Xpert Xpress SARS-CoV-2/FLU/RSV testing.  Fact Sheet for Patients: EntrepreneurPulse.com.au  Fact Sheet for Healthcare  Providers: IncredibleEmployment.be  This test is not yet approved or cleared by the [place] FDA and has been authorized for detection and/or diagnosis of SARS-CoV-2 by FDA under an Emergency Use Authorization (EUA). This EUA will remain in effect (meaning this test can be used) for the duration of the COVID-19 declaration under Section 564(b)(1) of the Act, 21 U.S.C. section 360bbb-3(b)(1), unless the authorization is terminated or revoked.  Performed at [facility], [address] [place], [place] [address]   Blood culture (routine x 2)     Status: None (Preliminary result)   Collection Time: 05/05/21 10:18 PM   Specimen: BLOOD  Result Value Ref Range Status   Specimen Description BLOOD RIGHT HAND  Final   Special Requests   Final    BOTTLES DRAWN AEROBIC AND ANAEROBIC Blood Culture adequate volume   Culture   Final    NO GROWTH 2 DAYS Performed at [facility], [address] [place], [place] [address]    Report Status PENDING  Incomplete  Blood culture (routine x 2)     Status: None (Preliminary result)   Collection Time: 05/05/21 11:17 PM   Specimen: BLOOD  Result Value Ref Range Status   Specimen Description BLOOD RIGHT ARM  Final   Special Requests   Final    BOTTLES DRAWN AEROBIC AND ANAEROBIC Blood Culture adequate volume   Culture   Final    NO GROWTH 2 DAYS Performed at [facility], [address] [place], [place] [address]    Report Status PENDING  Incomplete  MRSA Next Gen by PCR, Nasal     Status: None   Collection Time: 05/07/21  9:16 AM   Specimen: Nasal Mucosa; Nasal Swab  Result Value Ref Range Status   MRSA by PCR Next Gen NOT DETECTED NOT DETECTED Final    Comment: (NOTE) The GeneXpert MRSA Assay (FDA approved for NASAL specimens only), is one component of a comprehensive MRSA colonization surveillance program. It is not intended to diagnose MRSA infection nor to guide or monitor treatment for  MRSA infections. Test performance is not FDA approved in patients less than 38 years old. Performed at [facility], [address] [place], [place] [address]   Expectorated Sputum Assessment w Gram Stain, Rflx to Resp Cult     Status: None   Collection Time: 05/07/21  1:18 PM   Specimen: Sputum  Result Value Ref Range Status   Specimen Description SPUTUM  Final   Special Requests NONE  Final  Sputum evaluation   Final    Sputum specimen not acceptable for testing.  Please recollect.   RESULT CALLED TO, READ BACK BY AND VERIFIED WITH: Pat Patrick 05/07/21 Ruth at [facility], [address], [place], [place] [address]    Report Status 05/07/2021 FINAL  Final         Radiology Studies: DG Chest Portable 1 View  Result Date: 05/05/2021 CLINICAL DATA:  Weakness, COVID positive EXAM: PORTABLE CHEST 1 VIEW COMPARISON:  02/09/2018 FINDINGS: Mild patchy opacities in the right upper lobe and right mid lung. Mild left basilar opacity, possibly atelectasis. No pleural effusion or pneumothorax. Partial eventration of the right hemidiaphragm. The heart is normal in size. Suspected healed right lateral clavicle fracture. IMPRESSION: Mild patchy opacities in the right lung, compatible with pneumonia in this patient with known COVID. Electronically Signed   By: Julian Hy M.D.   On: 05/05/2021 21:22        Scheduled Meds:  acidophilus  1 capsule Oral Daily   atorvastatin  20 mg Oral Daily   calcium-vitamin D  2 tablet Oral Daily   cholecalciferol  2,000 Units Oral Daily   enoxaparin (LOVENOX) injection  40 mg Subcutaneous E83T   folic acid  1 mg Oral Daily   guaiFENesin  600 mg Oral BID   ibuprofen  400 mg Oral Once   levothyroxine  75 mcg Oral Q0600   losartan  25 mg Oral Daily   [START ON 05/11/2021] methotrexate  15 mg Oral Q Sun   metoprolol succinate  25 mg Oral Daily   pantoprazole  40 mg Oral Daily   polyethylene glycol  17 g Oral Daily    sertraline  25 mg Oral Daily   traZODone  50 mg Oral QHS   Continuous Infusions:  azithromycin Stopped (05/07/21 0135)   cefTRIAXone (ROCEPHIN)  IV Stopped (05/07/21 0000)     LOS: 2 days    Time spent: 25 minutes    Edwin Dada, MD [facility] 05/07/2021, 3:20 PM     Please page though [place] or Epic secure chat:  For [employer], Adult nurse

## 2021-05-07 NOTE — Evaluation (Signed)
Physical Therapy Evaluation Patient Details Name: Kim Santiago MRN: [id number] DOB: 1935-02-03 Today's Date: 05/07/2021   History of Present Illness  86 y.o. Caucasian female with medical history significant for hypertension, hypothyroidism, cutaneous lupus, coronary artery disease, Sjogren's syndrome and breast cancer status post left mastectomy, who presented to the ER with acute onset of significant generalized weakness.  Was diagnosed with COVID-19 about 4 weeks prior to admission.  Clinical Impression  Pt did well with PT exam and was able to ambulate around the nurses' station.  We had some issue getting consistent O2 sats but she appeared to stay in the mid/high 90s on the 2L.  She was able to mobilize in bed relatively well but did have a little issue getting to standing, needing heavy cuing and UE use to attain upright.  Pt had no LOBs or safety issues but had inconsistent cadence with varying appropriate and choppy steps.  Pt is safe to go home at discharge but will benefit from HHPT to increase gait, tolerance and general safety with transition home.     Follow Up Recommendations Home health PT    Equipment Recommendations  None recommended by PT    Recommendations for Other Services       Precautions / Restrictions Precautions Precautions: Fall Restrictions Weight Bearing Restrictions: No      Mobility  Bed Mobility Overal bed mobility: Modified Independent             General bed mobility comments: Pt able to get herself to EOB with minimal extra time but no direct assist    Transfers Overall transfer level: Needs assistance Equipment used: Rolling walker (2 wheeled) Transfers: Sit to/from Stand Sit to Stand: Min guard;Min assist         General transfer comment: Pt struggled to stand on her own on first 2 attempts, increasing cues for set up and sequencing each time, did rise on final attempt with only very light guidance/cues to insure hips stayed  forward  Ambulation/Gait Ambulation/Gait assistance: Supervision Gait Distance (Feet): 200 Feet Assistive device: Rolling walker (2 wheeled) Gait Pattern/deviations: Step-to pattern     General Gait Details: Pt with inconsistent cadence, generally self selecting short/choppy steps (daughter reports this started with CVA 2 years ago but much more prevelant in the last month).  Pt able to make short term improvments with consistent cadence with cuing, but consistently reverted back to truncated steps.  2L O2 used t/o the session with stats seemingly staying in the mid/upper 90s.  Stairs            Wheelchair Mobility    Modified Rankin (Stroke Patients Only)       Balance Overall balance assessment: Needs assistance Sitting-balance support: No upper extremity supported Sitting balance-Leahy Scale: Fair   Postural control: Posterior lean Standing balance support: Bilateral upper extremity supported Standing balance-Leahy Scale: Fair                               Pertinent Vitals/Pain Pain Assessment: No/denies pain    Home Living Family/patient expects to be discharged to:: Private residence Living Arrangements: Children Available Help at Discharge: Available PRN/intermittently;Family;Friend(s) (can provide 24/7 if needed)   Home Access: Level entry     Home Layout: One level Home Equipment: [place] - 2 wheels;Cane - single point      Prior Function Level of Independence: Independent         Comments: Until the  last month pt had been able to ambulate ad lib in the home, often w/o AD but using cane/walker more in the last month     Hand Dominance        Extremity/Trunk Assessment   Upper Extremity Assessment Upper Extremity Assessment: Generalized weakness    Lower Extremity Assessment Lower Extremity Assessment: Generalized weakness       Communication   Communication: No difficulties  Cognition Arousal/Alertness:  Awake/alert Behavior During Therapy: WFL for tasks assessed/performed Overall Cognitive Status: Within Functional Limits for tasks assessed                                        General Comments General comments (skin integrity, edema, etc.): Pt pleasant and motivated t/o the session    Exercises     Assessment/Plan    PT Assessment Patient needs continued PT services  PT Problem List Decreased strength;Decreased range of motion;Decreased activity tolerance;Decreased balance;Decreased mobility       PT Treatment Interventions DME instruction;Functional mobility training;Gait training;Therapeutic activities;Therapeutic exercise;Balance training    PT Goals (Current goals can be found in the Care Plan section)  Acute Rehab PT Goals Patient Stated Goal: Go home PT Goal Formulation: With patient/family Time For Goal Achievement: 05/21/21 Potential to Achieve Goals: Good    Frequency Min 2X/week   Barriers to discharge        Co-evaluation               AM-PAC PT "6 Clicks" Mobility  Outcome Measure Help needed turning from your back to your side while in a flat bed without using bedrails?: None Help needed moving from lying on your back to sitting on the side of a flat bed without using bedrails?: None Help needed moving to and from a bed to a chair (including a wheelchair)?: A Little Help needed standing up from a chair using your arms (e.g., wheelchair or bedside chair)?: A Little Help needed to walk in hospital room?: A Little Help needed climbing 3-5 steps with a railing? : A Little 6 Click Score: 20    End of Session Equipment Utilized During Treatment: Gait belt;Oxygen (2L) Activity Tolerance: Patient tolerated treatment well Patient left: with chair alarm set;with call bell/phone within reach;with family/visitor present Nurse Communication: Mobility status (O2 status with activity) PT Visit Diagnosis: Muscle weakness (generalized)  (M62.81);Difficulty in walking, not elsewhere classified (R26.2);Unsteadiness on feet (R26.81)    Time: 1005-1000 PT Time Calculation (min) (ACUTE ONLY): 1435 min   Charges:   PT Evaluation $PT Eval Low Complexity: 1 Low PT Treatments $Gait Training: 8-22 mins        Kreg Shropshire, DPT 05/07/2021, 12:16 PM

## 2021-05-08 ENCOUNTER — Inpatient Hospital Stay: Payer: PPO

## 2021-05-08 DIAGNOSIS — D696 Thrombocytopenia, unspecified: Secondary | ICD-10-CM

## 2021-05-08 DIAGNOSIS — M3500 Sicca syndrome, unspecified: Secondary | ICD-10-CM

## 2021-05-08 DIAGNOSIS — D72819 Decreased white blood cell count, unspecified: Secondary | ICD-10-CM

## 2021-05-08 DIAGNOSIS — D649 Anemia, unspecified: Secondary | ICD-10-CM | POA: Diagnosis not present

## 2021-05-08 DIAGNOSIS — D7589 Other specified diseases of blood and blood-forming organs: Secondary | ICD-10-CM

## 2021-05-08 DIAGNOSIS — L930 Discoid lupus erythematosus: Secondary | ICD-10-CM

## 2021-05-08 DIAGNOSIS — J189 Pneumonia, unspecified organism: Secondary | ICD-10-CM

## 2021-05-08 LAB — HEPATIC FUNCTION PANEL
ALT: 8 U/L (ref 0–44)
AST: 22 U/L (ref 15–41)
Albumin: 2.2 g/dL — ABNORMAL LOW (ref 3.5–5.0)
Alkaline Phosphatase: 37 U/L — ABNORMAL LOW (ref 38–126)
Bilirubin, Direct: 0.1 mg/dL (ref 0.0–0.2)
Indirect Bilirubin: 0.5 mg/dL (ref 0.3–0.9)
Total Bilirubin: 0.6 mg/dL (ref 0.3–1.2)
Total Protein: 4.6 g/dL — ABNORMAL LOW (ref 6.5–8.1)

## 2021-05-08 LAB — RETIC PANEL
Immature Retic Fract: 12.3 % (ref 2.3–15.9)
RBC.: 1.87 MIL/uL — ABNORMAL LOW (ref 3.87–5.11)
Retic Count, Absolute: 32.5 10*3/uL (ref 19.0–186.0)
Retic Ct Pct: 1.7 % (ref 0.4–3.1)
Reticulocyte Hemoglobin: 36.9 pg (ref >27.9)

## 2021-05-08 LAB — PROTEIN / CREATININE RATIO, URINE
Creatinine, Urine: 66 mg/dL
Protein Creatinine Ratio: 0.45 mg/mg{Cre} — ABNORMAL HIGH (ref 0.00–0.15)
Total Protein, Urine: 30 mg/dL

## 2021-05-08 LAB — BASIC METABOLIC PANEL
Anion gap: 4 — ABNORMAL LOW (ref 5–15)
BUN: 17 mg/dL (ref 8–23)
CO2: 26 mmol/L (ref 22–32)
Calcium: 7.7 mg/dL — ABNORMAL LOW (ref 8.9–10.3)
Chloride: 100 mmol/L (ref 98–111)
Creatinine, Ser: 0.82 mg/dL (ref 0.44–1.00)
GFR, Estimated: >60 mL/min (ref >60)
Glucose, Bld: 103 mg/dL — ABNORMAL HIGH (ref 70–99)
Potassium: 4.1 mmol/L (ref 3.5–5.1)
Sodium: 130 mmol/L — ABNORMAL LOW (ref 135–145)

## 2021-05-08 LAB — URINALYSIS, COMPLETE (UACMP) WITH MICROSCOPIC
Bacteria, UA: NONE SEEN
Bilirubin Urine: NEGATIVE
Glucose, UA: NEGATIVE mg/dL
Hgb urine dipstick: NEGATIVE
Ketones, ur: 5 mg/dL — AB
Leukocytes,Ua: NEGATIVE
Nitrite: NEGATIVE
Protein, ur: NEGATIVE mg/dL
Specific Gravity, Urine: 1.044 — ABNORMAL HIGH (ref 1.005–1.030)
pH: 5 (ref 5.0–8.0)

## 2021-05-08 LAB — CBC
HCT: 20.2 % — ABNORMAL LOW (ref 36.0–46.0)
Hemoglobin: 7.1 g/dL — ABNORMAL LOW (ref 12.0–15.0)
MCH: 37 pg — ABNORMAL HIGH (ref 26.0–34.0)
MCHC: 35.1 g/dL (ref 30.0–36.0)
MCV: 105.2 fL — ABNORMAL HIGH (ref 80.0–100.0)
Platelets: 137 10*3/uL — ABNORMAL LOW (ref 150–400)
RBC: 1.92 MIL/uL — ABNORMAL LOW (ref 3.87–5.11)
RDW: 18.2 % — ABNORMAL HIGH (ref 11.5–15.5)
WBC: 2.3 10*3/uL — ABNORMAL LOW (ref 4.0–10.5)
nRBC: 0 % (ref 0.0–0.2)

## 2021-05-08 LAB — LACTATE DEHYDROGENASE: LDH: 174 U/L (ref 98–192)

## 2021-05-08 LAB — PROCALCITONIN: Procalcitonin: 0.11 ng/mL

## 2021-05-08 LAB — PATHOLOGIST SMEAR REVIEW

## 2021-05-08 MED ORDER — IOHEXOL 350 MG/ML SOLN
75.0000 mL | Freq: Once | INTRAVENOUS | Status: AC | PRN
  Administered 2021-05-08: 14:00:00 75 mL via INTRAVENOUS

## 2021-05-08 NOTE — Consult Note (Signed)
[place]  [phone] [phone]  ID: [name] OB: May 24, 1935  MR#: [id number]  WCB#:[id number]  Patient Care Team: [name], MD as PCP - General (Internal Medicine) [name], MD (Internal Medicine) Bary Castilla Forest Gleason, MD (General Surgery) Oneta Rack, MD (Dermatology)  CHIEF COMPLAINT: Worsening anemia.  INTERVAL HISTORY: Patient is an 86 year old female who initially presented to the emergency room with weakness and is found to have community acquired pneumonia.  She has a history of Sojourn syndrome and is taking methotrexate.  She has a baseline anemia of approximately 10, but has trended down to approximately 7.  She feels improved since admission, but continues to have weakness and fatigue.  She denies any fevers.  She has no neurologic complaints.  She denies any chest pain, shortness of breath, cough, or hemoptysis.  She has no nausea, vomiting, constipation, diarrhea.  She has no urinary complaints.  Patient offers no further specific complaints today.  REVIEW OF SYSTEMS:   Review of Systems  Constitutional:  Positive for malaise/fatigue. Negative for fever and weight loss.  Respiratory:  Positive for shortness of breath. Negative for cough and hemoptysis.   Cardiovascular: Negative.  Negative for chest pain and leg swelling.  Gastrointestinal: Negative.  Negative for abdominal pain, blood in stool and melena.  Genitourinary: Negative.  Negative for hematuria.  Musculoskeletal: Negative.  Negative for back pain.  Skin: Negative.  Negative for rash.  Neurological:  Positive for weakness. Negative for dizziness, focal weakness and headaches.  Psychiatric/Behavioral: Negative.  The patient is not nervous/anxious.    As per HPI. Otherwise, a complete review of systems is negative.  PAST MEDICAL HISTORY: Past Medical History:  Diagnosis Date   Arthritis    Breast cancer ([place]) 2017   left mastectomy done  11/2015   Breast cancer in female [facility]) 11/18/2015   Left: 3.9 cm tumor, T2, 1/2 sentinel nodes positive for macro metastatic disease, N1, 3 negative nodes in the axillary tail, ER+,PR+, Her 2 neu, low Mammoprint score   Cancer (HCC)    thyroid takes levothyroxine   Chronic kidney disease    UTI   Genetic screening March 2017.   Mammoprint of left breast cancer: Low risk for recurrence.    History of heart attack 06/12/2014   Hypertension    hypothyroidism    secondary to thyroidectomy for thyroid ca   Hypothyroidism    Lupus ([place])    subcutaneous   Menopause 40s   natural, hot flashes and mood lability now gone, off prempro 7 months   Myocardial infarction [facility]) 2013   Osteoporosis    Osteopenia   Rosacea    Sjoegren syndrome    Stress-induced cardiomyopathy September of 2013   EF 35%. Peak troponin was 1.8.    PAST SURGICAL HISTORY: Past Surgical History:  Procedure Laterality Date   BACK SURGERY     BREAST BIOPSY Left 10/30/15   positive, done in Dr. Dwyane Luo office   CARDIAC CATHETERIZATION  05/2012   [place]. No significant CAD. Ejection fraction of 35% due to stress-induced cardiomyopathy.   CHOLECYSTECTOMY     COLONOSCOPY     DILATION AND CURETTAGE OF UTERUS     KYPHOSIS SURGERY  Feb 2008   L1, Dr. Mauri Pole   LUMBAR DISC SURGERY     L4-L5   MASTECTOMY Left 11/18/2015   positive   SENTINEL NODE BIOPSY Left 11/18/2015   Procedure: SENTINEL NODE BIOPSY;  Surgeon: Robert Bellow, MD;  Location:  [facility] ORS;  Service: General;  Laterality: Left;   SHOULDER ARTHROSCOPY  2004   Left, Dr. Jefm Bryant   SIMPLE MASTECTOMY WITH AXILLARY SENTINEL NODE BIOPSY Left 11/18/2015   Procedure: SIMPLE MASTECTOMY;  Surgeon: Robert Bellow, MD;  Location: [facility] ORS;  Service: General;  Laterality: Left;   SPINE SURGERY     L4-5 diskectomy   THYROIDECTOMY     Thyroid Cancer   TONSILLECTOMY     TUBAL LIGATION      FAMILY HISTORY: Family History  Problem Relation Age of Onset   Kidney  disease Mother    Heart disease Mother    COPD Father    Cancer Father        esophageal   Kidney disease Sister    Cancer Brother 66       colon cancer (both brothers)   Heart attack Brother 25   Heart disease Brother    Breast cancer Neg Hx     ADVANCED DIRECTIVES (Y/N):  @ADVDIR @  HEALTH MAINTENANCE: Social History   Tobacco Use   Smoking status: Never   Smokeless tobacco: Never  Vaping Use   Vaping Use: Never used  Substance Use Topics   Alcohol use: No   Drug use: No     Colonoscopy:  PAP:  Bone density:  Lipid panel:  Allergies  Allergen Reactions   Azathioprine Nausea And Vomiting    Severe vomiting   Hydroxychloroquine Hives and Nausea And Vomiting   Mycophenolate Mofetil Nausea Only   Amoxicillin Rash    Has patient had a PCN reaction causing immediate rash, facial/tongue/throat swelling, SOB or lightheadedness with hypotension: Unknown Has patient had a PCN reaction causing severe rash involving mucus membranes or skin necrosis: Unknown Has patient had a PCN reaction that required hospitalization: Unknown Has patient had a PCN reaction occurring within the last 10 years: Unknown If all of the above answers are "NO", then may proceed with Cephalosporin use. Tolerates ceftriaxone and Ancef   Codeine Nausea And Vomiting   Naprosyn [Naproxen] Swelling   Orudis [Ketoprofen] Hives   Sulfa Antibiotics Rash   Sulfathiazole Rash    Current Facility-Administered Medications  Medication Dose Route Frequency Provider Last Rate Last Admin   0.9 %  sodium chloride infusion   Intravenous Continuous Danford, Suann Larry, MD 100 mL/hr at 05/08/21 0642 New Bag at 05/08/21 0642   acetaminophen (TYLENOL) tablet 650 mg  650 mg Oral Q6H PRN Mansy, Jan A, MD   650 mg at 05/08/21 1819   Or   acetaminophen (TYLENOL) suppository 650 mg  650 mg Rectal Q6H PRN Mansy, Jan A, MD       acidophilus (RISAQUAD) capsule 1 capsule  1 capsule Oral Daily Mansy, Jan A, MD   1 capsule  at 05/08/21 1135   atorvastatin (LIPITOR) tablet 20 mg  20 mg Oral Daily Mansy, Jan A, MD   20 mg at 05/08/21 1135   azithromycin (ZITHROMAX) 500 mg in sodium chloride 0.9 % 250 mL IVPB  500 mg Intravenous Q24H Mansy, Jan A, MD   Stopped at 05/08/21 0127   calcium-vitamin D (OSCAL WITH D) 500-200 MG-UNIT per tablet 2 tablet  2 tablet Oral Daily Mansy, Jan A, MD   2 tablet at 05/08/21 1135   cefTRIAXone (ROCEPHIN) 2 g in sodium chloride 0.9 % 100 mL IVPB  2 g Intravenous Q24H Mansy, Jan A, MD 200 mL/hr at 05/08/21 2155 2 g at 05/08/21 2155   cholecalciferol (VITAMIN D3) tablet 2,000 Units  2,000 Units Oral Daily Mansy, Jan A, MD   2,000 Units at 05/08/21 1135   enoxaparin (LOVENOX) injection 40 mg  40 mg Subcutaneous Q24H Mansy, Jan A, MD   40 mg at 90/30/09 2330   folic acid (FOLVITE) tablet 1 mg  1 mg Oral Daily Mansy, Jan A, MD   1 mg at 05/08/21 1135   guaiFENesin (MUCINEX) 12 hr tablet 600 mg  600 mg Oral BID Mansy, Jan A, MD   600 mg at 05/08/21 2142   levothyroxine (SYNTHROID) tablet 75 mcg  75 mcg Oral Q0600 Mansy, Jan A, MD   75 mcg at 05/08/21 0639   losartan (COZAAR) tablet 25 mg  25 mg Oral Daily Mansy, Jan A, MD   25 mg at 05/08/21 1135   magnesium hydroxide (MILK OF MAGNESIA) suspension 30 mL  30 mL Oral Daily PRN Mansy, Jan A, MD       metoprolol succinate (TOPROL-XL) 24 hr tablet 25 mg  25 mg Oral Daily Mansy, Jan A, MD   25 mg at 05/08/21 1135   nitroGLYCERIN (NITROSTAT) SL tablet 0.4 mg  0.4 mg Sublingual Q5 min PRN Mansy, Jan A, MD       ondansetron [facility]) tablet 4 mg  4 mg Oral Q6H PRN Mansy, Jan A, MD       Or   ondansetron [facility]) injection 4 mg  4 mg Intravenous Q6H PRN Mansy, Jan A, MD   4 mg at 05/08/21 0738   pantoprazole (PROTONIX) EC tablet 40 mg  40 mg Oral Daily Mansy, Jan A, MD   40 mg at 05/08/21 1135   polyethylene glycol (MIRALAX / GLYCOLAX) packet 17 g  17 g Oral Daily Mansy, Jan A, MD   17 g at 05/08/21 1135   sertraline (ZOLOFT) tablet 25 mg  25 mg Oral Daily  Mansy, Jan A, MD   25 mg at 05/08/21 1135   tiZANidine (ZANAFLEX) tablet 2 mg  2 mg Oral TID PRN Nicole Kindred A, DO       traZODone (DESYREL) tablet 25 mg  25 mg Oral QHS PRN Mansy, Jan A, MD       traZODone (DESYREL) tablet 50 mg  50 mg Oral QHS Mansy, Jan A, MD   50 mg at 05/08/21 2142    OBJECTIVE: Vitals:   05/08/21 1814 05/08/21 1948  BP:  (!) 111/50  Pulse:  100  Resp:  17  Temp: (!) 100.5 F (38.1 C) 100.1 F (37.8 C)  SpO2:  98%     Body mass index is 25.38 kg/m.    ECOG FS:2 - Symptomatic, <50% confined to bed  General: Well-developed, well-nourished, no acute distress. Eyes: Pink conjunctiva, anicteric sclera. HEENT: Normocephalic, moist mucous membranes. Lungs: No audible wheezing or coughing. Heart: Regular rate and rhythm. Abdomen: Soft, nontender, no obvious distention. Musculoskeletal: No edema, cyanosis, or clubbing. Neuro: Alert, answering all questions appropriately. Cranial nerves grossly intact. Skin: No rashes or petechiae noted. Psych: Normal affect.   LAB RESULTS:  Lab Results  Component Value Date   NA 130 (L) 05/08/2021   K 4.1 05/08/2021   CL 100 05/08/2021   CO2 26 05/08/2021   GLUCOSE 103 (H) 05/08/2021   BUN 17 05/08/2021   CREATININE 0.82 05/08/2021   CALCIUM 7.7 (L) 05/08/2021   PROT 4.6 (L) 05/08/2021   ALBUMIN 2.2 (L) 05/08/2021   AST 22 05/08/2021   ALT 8 05/08/2021   ALKPHOS 37 (L) 05/08/2021   BILITOT 0.6 05/08/2021  GFRNONAA >60 05/08/2021   GFRAA >60 04/09/2020    Lab Results  Component Value Date   WBC 2.3 (L) 05/08/2021   NEUTROABS 1.5 02/25/2021   HGB 7.1 (L) 05/08/2021   HCT 20.2 (L) 05/08/2021   MCV 105.2 (H) 05/08/2021   PLT 137 (L) 05/08/2021   Lab Results  Component Value Date   IRON 64 02/25/2021   TIBC 238 (L) 02/25/2021   IRONPCTSAT 27 02/25/2021   Lab Results  Component Value Date   FERRITIN 252 02/25/2021     STUDIES: CT CHEST W CONTRAST  Result Date: 05/08/2021 CLINICAL DATA:   86 year old female with history of pneumonia noted on recent chest x-ray. COVID positive patient. Evaluate for pneumonia versus interstitial lung disease. History of Sjogren's disease and cutaneous lupus. EXAM: CT CHEST WITH CONTRAST TECHNIQUE: Multidetector CT imaging of the chest was performed during intravenous contrast administration. CONTRAST:  16mL OMNIPAQUE IOHEXOL 350 MG/ML SOLN COMPARISON:  Chest CT 05/05/2021. FINDINGS: Cardiovascular: Heart size is normal. There is no significant pericardial fluid, thickening or pericardial calcification. There is aortic atherosclerosis, as well as atherosclerosis of the great vessels of the mediastinum and the coronary arteries, including calcified atherosclerotic plaque in the left anterior descending and right coronary arteries. Calcifications of the mitral annulus. Mediastinum/Nodes: Multiple prominent borderline enlarged mediastinal lymph nodes are noted, nonspecific and likely reactive. No definite pathologically enlarged mediastinal or hilar lymph nodes. Small hiatal hernia. No axillary lymphadenopathy. Lungs/Pleura: Widespread but patchy areas of ground-glass attenuation, interlobular septal thickening and thickening of the peribronchovascular interstitium are noted throughout all aspects of the lungs bilaterally, indicative of severe multilobar bilateral bronchopneumonia, presumably secondary to reported COVID infection. Trace bilateral pleural effusions layer dependently. Upper Abdomen: Aortic atherosclerosis.  Status post cholecystectomy. Musculoskeletal: Status post left modified radical mastectomy. Chronic compression fracture of L1 with 25% loss of anterior vertebral body height and post vertebroplasty changes. There are no aggressive appearing lytic or blastic lesions noted in the visualized portions of the skeleton. IMPRESSION: 1. Severe multilobar bilateral bronchopneumonia, presumably secondary to reported COVID infection. 2. Trace bilateral pleural  effusions. 3. Small hiatal hernia. 4. Aortic atherosclerosis, in addition to 2 vessel coronary artery disease. Aortic Atherosclerosis (ICD10-I70.0). Electronically Signed   By: Vinnie Langton M.D.   On: 05/08/2021 16:15   DG Chest Portable 1 View  Result Date: 05/05/2021 CLINICAL DATA:  Weakness, COVID positive EXAM: PORTABLE CHEST 1 VIEW COMPARISON:  02/09/2018 FINDINGS: Mild patchy opacities in the right upper lobe and right mid lung. Mild left basilar opacity, possibly atelectasis. No pleural effusion or pneumothorax. Partial eventration of the right hemidiaphragm. The heart is normal in size. Suspected healed right lateral clavicle fracture. IMPRESSION: Mild patchy opacities in the right lung, compatible with pneumonia in this patient with known COVID. Electronically Signed   By: Julian Hy M.D.   On: 05/05/2021 21:22    ASSESSMENT: Worsening anemia.  PLAN:    1.  Anemia: Patient's baseline hemoglobin is approximately 10 and she has trended down to 7.1 during this hospital admission.  Iron stores are within normal limits.  Reticulocyte count is inappropriately normal.  B12 and folate are pending at time of dictation.  She has a normal LDH therefore likely there is no hemolysis, but haptoglobin is pending.  Pathologist smear review was unrevealing other than macrocytosis.  Possibly related to poor bone marrow reserve and use of methotrexate, which has been discontinued.  No intervention is needed.  Continue to monitor CBC and if no improvement can consider a  bone marrow biopsy as an outpatient. 2.  Macrocytosis: B12 and folate are pending.  Possibly related to methotrexate use. 3.  Community-acquired pneumonia: Continue current antibiotics as ordered. 4.  Leukopenia: Mild, monitor.  Consider outpatient bone marrow biopsy if no improvement. 5.  Thrombocytopenia: Mild, monitor.  Consider outpatient bone marrow biopsy if no improvement. 6.  Cutaneous lupus erythematosus/Sjogren's syndrome:  Methotrexate on hold.  Appreciate consult, will follow.     Lloyd Huger, MD   05/08/2021 10:04 PM

## 2021-05-08 NOTE — Plan of Care (Signed)
  Problem: Education: Goal: Knowledge of General Education information will improve Description: Including pain rating scale, medication(s)/side effects and non-pharmacologic comfort measures Outcome: Progressing   Problem: Health Behavior/Discharge Planning: Goal: Ability to manage health-related needs will improve Outcome: Progressing   Problem: Clinical Measurements: Goal: Ability to maintain clinical measurements within normal limits will improve Outcome: Progressing Goal: Will remain free from infection Outcome: Progressing Goal: Diagnostic test results will improve Outcome: Progressing Goal: Respiratory complications will improve Outcome: Progressing Goal: Cardiovascular complication will be avoided Outcome: Progressing   Problem: Nutrition: Goal: Adequate nutrition will be maintained Outcome: Progressing   Problem: Coping: Goal: Level of anxiety will decrease Outcome: Progressing   Problem: Elimination: Goal: Will not experience complications related to bowel motility Outcome: Progressing Goal: Will not experience complications related to urinary retention Outcome: Progressing   Problem: Pain Managment: Goal: General experience of comfort will improve Outcome: Progressing   Problem: Safety: Goal: Ability to remain free from injury will improve Outcome: Progressing   Problem: Skin Integrity: Goal: Risk for impaired skin integrity will decrease Outcome: Progressing   Problem: Education: Goal: Knowledge of risk factors and measures for prevention of condition will improve Outcome: Progressing   Problem: Coping: Goal: Psychosocial and spiritual needs will be supported Outcome: Progressing   Problem: Respiratory: Goal: Will maintain a patent airway Outcome: Progressing Goal: Complications related to the disease process, condition or treatment will be avoided or minimized Outcome: Progressing   Problem: Clinical Measurements: Goal: Ability to maintain a  body temperature in the normal range will improve Outcome: Progressing   Problem: Respiratory: Goal: Ability to maintain adequate ventilation will improve Outcome: Progressing Goal: Ability to maintain a clear airway will improve Outcome: Progressing

## 2021-05-08 NOTE — Progress Notes (Signed)
PT Cancellation Note  Patient Details Name: Kim Santiago MRN: [id number] DOB: 10-01-1934   Cancelled Treatment:    Reason Eval/Treat Not Completed: Medical issues which prohibited therapy. Patient with fever of 103 this am, HR 114, Hgb 7.1. Will hold PT this morning. Re-attempt when medically appropriate.    Ladavion Savitz 05/08/2021, 9:23 AM

## 2021-05-08 NOTE — Progress Notes (Signed)
[facility] PROGRESS NOTE    Kim Santiago  ASN:[id number] DOB: 10-26-1934 DOA: 05/05/2021 PCP: [name], MD      Brief Narrative:  Kim Santiago is a 86 y.o. 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  In the ER, HR 106, and CXR showed patchy pneumonia.  Admitted for treatment of pneumonia.         Assessment & Plan:  Suspected community-acquired pneumonia right upper and middle lobes Admitted and started on antibiotics, PSI 134, risk class 5  However she had no clinical response to antibiotics.  MRSA negative.  Sputum culture pending.  Respiratory virus panel negative.  Will broaden differential to include methotrexate pneumnitis, fungal pneumonitis.  Doubt CLE -> SLE because I wouldn't expect pnemonia from SLE.    - Follow sputum - Obtain CT chest - Follow blood cultures - Continue Rocephin and Zithromax - Continue flutter and incentive spirometer - Hold methotrexate   Acute respiratory failure with hypoxia Patient presented with respiratory rates 21 to 33 over the first 2 days in the hospital, as well as recently taken off oxygen.  Recent COVID No active infection.  Infected one month ago, this is resolved.  Acute on chronic anemia Chronic anemia ~10 g/dL   Here, acutely worse.  Still no bleeding, hemoglobin stable from yesterday, 7.1 g/dL. No evidence of hemolysis - Check B12 and folate - Continue folate supplement - Hold methotrexate - Consult hematology  Leukopenia - Consult hematology  Hyponatremia Stable, asymptomatic  Cutaneous lupus erythematosus - Hold MTX  History of breast cancer  Hypothyroidism - Continue levothyroxine  Hypertension Coronary disease BP normal - Continue atorvastatin, losartan, metoprolol - Hold Lasix   Depression - Continue sertraline and  trazodone  GERD -Continue PPI            Disposition: Status is: Inpatient  Remains inpatient appropriate because:Inpatient level of care appropriate due to severity of illness  Dispo: The patient is from: Home              Anticipated d/c is to: SNF              Patient currently is not medically stable to d/c.   Difficult to place patient No       Level of care: Med-Surg       MDM: The below labs and imaging reports were reviewed and summarized above.  Medication management as above.    DVT prophylaxis: enoxaparin (LOVENOX) injection 40 mg Start: 05/05/21 2330  Code Status: DNR Family Communication: Son at the bedside             Subjective: Fever yesterday.  No confusion.  Still out of breath.  Still with a cough.  No change in her rash, no joint pains, no oral lesions.  No chest pain.      Objective: Vitals:   05/08/21 0644 05/08/21 0832 05/08/21 1011 05/08/21 1323  BP:  (!) 132/56  114/67  Pulse:  (!) 114 (!) 107 98  Resp:  18  18  Temp: 100 F (37.8 C) (!) 103.1 F (39.5 C) (!) 101.4 F (38.6 C) 99.1 F (37.3 C)  TempSrc:   Oral Oral  SpO2:  99%  99%  Weight:      Height:        Intake/Output Summary (Last 24 hours) at 05/08/2021 1516 Last data filed at 05/08/2021  1121 Gross per 24 hour  Intake 1111.87 ml  Output 1950 ml  Net -838.13 ml   Filed Weights   05/05/21 1731  Weight: 65 kg    Examination: General appearance: Elderly adult female, appears tired and sick    , lying in bed HEENT: Anicteric, conjunctive a pink, lids and lashes normal.  No nasal deformity, discharge, or epistaxis.  Lips moist, dentition in good repair, oropharynx moist, no oral lesions or ulcers Skin:  Cardiac: RRR, no murmurs, no lower extremity edema Respiratory: Rales in the right base, nothing on the left.  No wheezing.  Respiratory effort normal at rest, appears dyspneic with exertion Abdomen: Abdomen soft no tenderness palpation or guarding,  no ascites or distention MSK:  Neuro: Extraocular movements intact, moves all extremities with generalized weakness, speech fluent Psych: Blunted, affect appropriate, judgment is fair slightly impaired      Data Reviewed: I have personally reviewed following labs and imaging studies:  CBC: Recent Labs  Lab 05/05/21 1734 05/06/21 0522 05/07/21 0551 05/08/21 0436  WBC 4.1 3.2* 2.8* 2.3*  HGB 9.0* 8.3* 7.2* 7.1*  HCT 25.0* 23.4* 20.4* 20.2*  MCV 103.7* 105.9* 109.1* 105.2*  PLT 138* 121* 131* 287*   Basic Metabolic Panel: Recent Labs  Lab 05/05/21 1734 05/06/21 0522 05/07/21 0551 05/08/21 0436  NA 129* 130* 130* 130*  K 4.4 3.8 3.9 4.1  CL 93* 96* 99 100  CO2 _0 GLUCOSE 103* 91 94 103*  BUN _1 CREATININE 0.92 0.76 0.67 0.82  CALCIUM 8.7* 8.0* 7.6* 7.7*  MG  --   --  1.9  --    GFR: Estimated Creatinine Clearance: 46.3 mL/min (by C-G formula based on SCr of 0.82 mg/dL). Liver Function Tests: Recent Labs  Lab 05/08/21 0436  AST 22  ALT 8  ALKPHOS 37*  BILITOT 0.6  PROT 4.6*  ALBUMIN 2.2*   No results for input(s): LIPASE, AMYLASE in the last 168 hours. No results for input(s): AMMONIA in the last 168 hours. Coagulation Profile: No results for input(s): INR, PROTIME in the last 168 hours. Cardiac Enzymes: No results for input(s): CKTOTAL, CKMB, CKMBINDEX, TROPONINI in the last 168 hours. BNP (last 3 results) No results for input(s): PROBNP in the last 8760 hours. HbA1C: No results for input(s): HGBA1C in the last 72 hours. CBG: No results for input(s): GLUCAP in the last 168 hours. Lipid Profile: No results for input(s): CHOL, HDL, LDLCALC, TRIG, CHOLHDL, LDLDIRECT in the last 72 hours. Thyroid Function Tests: No results for input(s): TSH, T4TOTAL, FREET4, T3FREE, THYROIDAB in the last 72 hours. Anemia Panel: Recent Labs    05/08/21 0436  RETICCTPCT 1.7   Urine analysis:    Component Value Date/Time   COLORURINE YELLOW (A)  05/05/2021 2149   APPEARANCEUR HAZY (A) 05/05/2021 2149   LABSPEC 1.016 05/05/2021 2149   PHURINE 6.0 05/05/2021 2149   GLUCOSEU NEGATIVE 05/05/2021 2149   GLUCOSEU NEGATIVE 05/11/2017 1143   HGBUR NEGATIVE 05/05/2021 2149   BILIRUBINUR NEGATIVE 05/05/2021 2149   BILIRUBINUR negative 07/04/2018 1134   KETONESUR 5 (A) 05/05/2021 2149   PROTEINUR NEGATIVE 05/05/2021 2149   UROBILINOGEN 0.2 07/04/2018 1134   UROBILINOGEN 0.2 05/11/2017 1143   NITRITE NEGATIVE 05/05/2021 2149   LEUKOCYTESUR NEGATIVE 05/05/2021 2149   Sepsis Labs: _2 (procalcitonin:4,lacticacidven:4)  ) Recent Results (from the past 240 hour(s))  Resp Panel by RT-PCR (Flu A&B, Covid) Nasopharyngeal Swab     Status: Abnormal   Collection Time:  05/05/21  9:49 PM   Specimen: Nasopharyngeal Swab; Nasopharyngeal(NP) swabs in vial transport medium  Result Value Ref Range Status   SARS Coronavirus 2 by RT PCR POSITIVE (A) NEGATIVE Final    Comment: RESULT CALLED TO, READ BACK BY AND VERIFIED WITH: SANTIA [email or web address]  05/05/21 RH (NOTE) SARS-CoV-2 target nucleic acids are DETECTED.  The SARS-CoV-2 RNA is generally detectable in upper respiratory specimens during the acute phase of infection. Positive results are indicative of the presence of the identified virus, but do not rule out bacterial infection or co-infection with other pathogens not detected by the test. Clinical correlation with patient history and other diagnostic information is necessary to determine patient infection status. The expected result is Negative.  Fact Sheet for Patients: EntrepreneurPulse.com.au  Fact Sheet for Healthcare Providers: IncredibleEmployment.be  This test is not yet approved or cleared by the [place] FDA and  has been authorized for detection and/or diagnosis of SARS-CoV-2 by FDA under an Emergency Use Authorization (EUA).  This EUA will remain in effect (meaning this test can be  [place] ed) for the duration of  the COVID-19 declaration under Section 564(b)(1) of the Act, 21 U.S.C. section 360bbb-3(b)(1), unless the authorization is terminated or revoked sooner.     Influenza A by PCR NEGATIVE NEGATIVE Final   Influenza B by PCR NEGATIVE NEGATIVE Final    Comment: (NOTE) The Xpert Xpress SARS-CoV-2/FLU/RSV plus assay is intended as an aid in the diagnosis of influenza from Nasopharyngeal swab specimens and should not be used as a sole basis for treatment. Nasal washings and aspirates are unacceptable for Xpert Xpress SARS-CoV-2/FLU/RSV testing.  Fact Sheet for Patients: EntrepreneurPulse.com.au  Fact Sheet for Healthcare Providers: IncredibleEmployment.be  This test is not yet approved or cleared by the [place] FDA and has been authorized for detection and/or diagnosis of SARS-CoV-2 by FDA under an Emergency Use Authorization (EUA). This EUA will remain in effect (meaning this test can be used) for the duration of the COVID-19 declaration under Section 564(b)(1) of the Act, 21 U.S.C. section 360bbb-3(b)(1), unless the authorization is terminated or revoked.  Performed at [facility], [address] [place], [place] [address]   Blood culture (routine x 2)     Status: None (Preliminary result)   Collection Time: 05/05/21 10:18 PM   Specimen: BLOOD  Result Value Ref Range Status   Specimen Description BLOOD RIGHT HAND  Final   Special Requests   Final    BOTTLES DRAWN AEROBIC AND ANAEROBIC Blood Culture adequate volume   Culture   Final    NO GROWTH 3 DAYS Performed at [facility], [address], [place], [place] [address]    Report Status PENDING  Incomplete  Blood culture (routine x 2)     Status: None (Preliminary result)   Collection Time: 05/05/21 11:17 PM   Specimen: BLOOD  Result Value Ref Range Status   Specimen Description BLOOD RIGHT ARM  Final   Special Requests   Final     BOTTLES DRAWN AEROBIC AND ANAEROBIC Blood Culture adequate volume   Culture   Final    NO GROWTH 3 DAYS Performed at [facility], [address], [place], [place] [address]    Report Status PENDING  Incomplete  MRSA Next Gen by PCR, Nasal     Status: None   Collection Time: 05/07/21  9:16 AM   Specimen: Nasal Mucosa; Nasal Swab  Result Value Ref Range Status   MRSA by PCR Next Gen NOT DETECTED  NOT DETECTED Final    Comment: (NOTE) The GeneXpert MRSA Assay (FDA approved for NASAL specimens only), is one component of a comprehensive MRSA colonization surveillance program. It is not intended to diagnose MRSA infection nor to guide or monitor treatment for MRSA infections. Test performance is not FDA approved in patients less than 14 years old. Performed at [facility], [place], [place] [address]   Expectorated Sputum Assessment w Gram Stain, Rflx to Resp Cult     Status: None   Collection Time: 05/07/21  1:18 PM   Specimen: Sputum  Result Value Ref Range Status   Specimen Description SPUTUM  Final   Special Requests NONE  Final   Sputum evaluation   Final    Sputum specimen not acceptable for testing.  Please recollect.   RESULT CALLED TO, READ BACK BY AND VERIFIED WITH: Pat Patrick 05/07/21 1411 KLW Performed at [facility], [address] [place], [place] [address]    Report Status 05/07/2021 FINAL  Final  Respiratory (~20 pathogens) panel by PCR     Status: None   Collection Time: 05/07/21  4:59 PM   Specimen: Nasopharyngeal Swab; Respiratory  Result Value Ref Range Status   Adenovirus NOT DETECTED NOT DETECTED Final   Coronavirus 229E NOT DETECTED NOT DETECTED Final    Comment: (NOTE) The Coronavirus on the Respiratory Panel, DOES NOT test for the novel  Coronavirus (2019 nCoV)    Coronavirus HKU1 NOT DETECTED NOT DETECTED Final   Coronavirus NL63 NOT DETECTED NOT DETECTED Final   Coronavirus OC43 NOT DETECTED NOT DETECTED  Final   Metapneumovirus NOT DETECTED NOT DETECTED Final   Rhinovirus / Enterovirus NOT DETECTED NOT DETECTED Final   Influenza A NOT DETECTED NOT DETECTED Final   Influenza B NOT DETECTED NOT DETECTED Final   Parainfluenza Virus 1 NOT DETECTED NOT DETECTED Final   Parainfluenza Virus 2 NOT DETECTED NOT DETECTED Final   Parainfluenza Virus 3 NOT DETECTED NOT DETECTED Final   Parainfluenza Virus 4 NOT DETECTED NOT DETECTED Final   Respiratory Syncytial Virus NOT DETECTED NOT DETECTED Final   Bordetella pertussis NOT DETECTED NOT DETECTED Final   Bordetella Parapertussis NOT DETECTED NOT DETECTED Final   Chlamydophila pneumoniae NOT DETECTED NOT DETECTED Final   Mycoplasma pneumoniae NOT DETECTED NOT DETECTED Final    Comment: Performed at [facility], [address], [place], [place] [address]  Expectorated Sputum Assessment w Gram Stain, Rflx to Resp Cult     Status: None   Collection Time: 05/07/21  7:19 PM   Specimen: Expectorated Sputum  Result Value Ref Range Status   Specimen Description EXPECTORATED SPUTUM  Final   Special Requests NONE  Final   Sputum evaluation   Final    THIS SPECIMEN IS ACCEPTABLE FOR SPUTUM CULTURE Performed at [facility], [address], [place], [place] [address]    Report Status 05/07/2021 FINAL  Final  Culture, Respiratory w Gram Stain     Status: None (Preliminary result)   Collection Time: 05/07/21  7:19 PM  Result Value Ref Range Status   Specimen Description   Final    EXPECTORATED SPUTUM Performed at [facility], [address] [place], [place] [address]    Special Requests   Final    NONE Reflexed from [id number] Performed at [facility], [address] [place], [place] [address]    Gram Stain   Final    NO WBC SEEN RARE GRAM POSITIVE  COCCI IN PAIRS RARE GRAM POSITIVE RODS RARE BUDDING YEAST SEEN Performed at [facility], [place] [address], [place], [place] [address]    Culture PENDING   Incomplete   Report Status PENDING  Incomplete         Radiology Studies: No results found.      Scheduled Meds:  acidophilus  1 capsule Oral Daily   atorvastatin  20 mg Oral Daily   calcium-vitamin D  2 tablet Oral Daily   cholecalciferol  2,000 Units Oral Daily   enoxaparin (LOVENOX) injection  40 mg Subcutaneous K71U   folic acid  1 mg Oral Daily   guaiFENesin  600 mg Oral BID   levothyroxine  75 mcg Oral Q0600   losartan  25 mg Oral Daily   metoprolol succinate  25 mg Oral Daily   pantoprazole  40 mg Oral Daily   polyethylene glycol  17 g Oral Daily   sertraline  25 mg Oral Daily   traZODone  50 mg Oral QHS   Continuous Infusions:  sodium chloride 100 mL/hr at 05/08/21 0642   azithromycin Stopped (05/08/21 0127)   cefTRIAXone (ROCEPHIN)  IV Stopped (05/07/21 2215)     LOS: 3 days    Time spent: 35 minutes    Edwin Dada, MD [facility] 05/08/2021, 3:16 PM     Please page though [place] or Epic secure chat:  For [employer], Adult nurse

## 2021-05-09 DIAGNOSIS — U071 COVID-19: Principal | ICD-10-CM

## 2021-05-09 DIAGNOSIS — L932 Other local lupus erythematosus: Secondary | ICD-10-CM

## 2021-05-09 DIAGNOSIS — J9601 Acute respiratory failure with hypoxia: Secondary | ICD-10-CM | POA: Diagnosis not present

## 2021-05-09 DIAGNOSIS — J1282 Pneumonia due to coronavirus disease 2019: Secondary | ICD-10-CM | POA: Diagnosis not present

## 2021-05-09 LAB — BASIC METABOLIC PANEL
Anion gap: 11 (ref 5–15)
BUN: 41 mg/dL — ABNORMAL HIGH (ref 8–23)
CO2: 23 mmol/L (ref 22–32)
Calcium: 10.3 mg/dL (ref 8.9–10.3)
Chloride: 103 mmol/L (ref 98–111)
Creatinine, Ser: 1.06 mg/dL — ABNORMAL HIGH (ref 0.44–1.00)
GFR, Estimated: 52 mL/min — ABNORMAL LOW (ref >60)
Glucose, Bld: 176 mg/dL — ABNORMAL HIGH (ref 70–99)
Potassium: 4.7 mmol/L (ref 3.5–5.1)
Sodium: 137 mmol/L (ref 135–145)

## 2021-05-09 LAB — HIV ANTIBODY (ROUTINE TESTING W REFLEX): HIV Screen 4th Generation wRfx: NONREACTIVE

## 2021-05-09 LAB — HAPTOGLOBIN: Haptoglobin: 229 mg/dL (ref 41–333)

## 2021-05-09 LAB — PROCALCITONIN: Procalcitonin: 0.41 ng/mL

## 2021-05-09 LAB — CBC
HCT: 19.4 % — ABNORMAL LOW (ref 36.0–46.0)
Hemoglobin: 6.9 g/dL — ABNORMAL LOW (ref 12.0–15.0)
MCH: 38.1 pg — ABNORMAL HIGH (ref 26.0–34.0)
MCHC: 35.6 g/dL (ref 30.0–36.0)
MCV: 107.2 fL — ABNORMAL HIGH (ref 80.0–100.0)
Platelets: 153 10*3/uL (ref 150–400)
RBC: 1.81 MIL/uL — ABNORMAL LOW (ref 3.87–5.11)
RDW: 18.1 % — ABNORMAL HIGH (ref 11.5–15.5)
WBC: 2.8 10*3/uL — ABNORMAL LOW (ref 4.0–10.5)
nRBC: 0 % (ref 0.0–0.2)

## 2021-05-09 LAB — ABO/RH: ABO/RH(D): O POS

## 2021-05-09 LAB — TSH: TSH: 1.63 u[IU]/mL (ref 0.350–4.500)

## 2021-05-09 LAB — PREPARE RBC (CROSSMATCH)

## 2021-05-09 LAB — FOLATE: Folate: 17.5 ng/mL (ref >5.9)

## 2021-05-09 LAB — VITAMIN B12: Vitamin B-12: 4824 pg/mL — ABNORMAL HIGH (ref 180–914)

## 2021-05-09 LAB — BRAIN NATRIURETIC PEPTIDE: B Natriuretic Peptide: 86.6 pg/mL (ref 0.0–100.0)

## 2021-05-09 LAB — CORTISOL: Cortisol, Plasma: 14.5 ug/dL

## 2021-05-09 MED ORDER — IBUPROFEN 400 MG PO TABS
400.0000 mg | ORAL_TABLET | Freq: Four times a day (QID) | ORAL | Status: DC | PRN
  Administered 2021-05-09: 18:00:00 400 mg via ORAL
  Filled 2021-05-09: qty 1

## 2021-05-09 MED ORDER — SODIUM CHLORIDE 0.9% IV SOLUTION: Freq: Once | INTRAVENOUS | Status: AC

## 2021-05-09 MED ORDER — SODIUM CHLORIDE 0.9 % IV SOLN
100.0000 mg | Freq: Every day | INTRAVENOUS | Status: DC
  Filled 2021-05-09: qty 20

## 2021-05-09 MED ORDER — METHYLPREDNISOLONE SODIUM SUCC 40 MG IJ SOLR
40.0000 mg | Freq: Every day | INTRAMUSCULAR | Status: DC
  Administered 2021-05-09 – 2021-05-11 (×3): 40 mg via INTRAVENOUS
  Filled 2021-05-09 (×3): qty 1

## 2021-05-09 MED ORDER — SODIUM CHLORIDE 0.9 % IV SOLN
200.0000 mg | Freq: Once | INTRAVENOUS | Status: DC
  Filled 2021-05-09: qty 40

## 2021-05-09 MED ORDER — SODIUM CHLORIDE 0.9 % IV SOLN
100.0000 mg | Freq: Once | INTRAVENOUS | Status: AC
  Administered 2021-05-10: 100 mg via INTRAVENOUS
  Filled 2021-05-09: qty 100

## 2021-05-09 MED ORDER — SODIUM CHLORIDE 0.9 % IV SOLN
100.0000 mg | Freq: Every day | INTRAVENOUS | Status: AC
  Administered 2021-05-10 – 2021-05-13 (×4): 100 mg via INTRAVENOUS
  Filled 2021-05-09: qty 100
  Filled 2021-05-09: qty 20
  Filled 2021-05-09 (×3): qty 100
  Filled 2021-05-09: qty 20

## 2021-05-09 NOTE — Progress Notes (Signed)
Remdesivir - Pharmacy Brief Note    Patient diagnosed with COVID 4 weeks ago, though residual cough and phlegm caused patient to present to ED. Patient is chronically immunosuppressed on methotrexate. Cycle threshold 30.3 indicative of active replication of virus and ongoing infection. Per pulmonology, plan is to start COVID treatment.   O:  ALT: 22 CXR: Mild patchy opacities in the right lung, compatible with pneumonia in this patient with known COVID. SpO2: 94% on [place] 2 L/min   A/P:  Remdesivir 200 mg IVPB once followed by 100 mg IVPB daily x 4 days.   Wynelle Cleveland, PharmD Pharmacy Resident  05/09/2021 3:00 PM

## 2021-05-09 NOTE — Progress Notes (Signed)
[facility] PROGRESS NOTE    Kim Santiago  JOI:[id number] DOB: 12/30/1934 DOA: 05/05/2021 PCP: [name], MD      Brief Narrative:  Kim Santiago is a 86 y.o. 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  In the ER, HR 106, and CXR showed patchy pneumonia.  Admitted for treatment of pneumonia.         Assessment & Plan:  Pneumonia, NOS Possible COVID pneumonitis Admitted and chest imaging showed RUL/RML patchy opacities, started on antibiotics.  COVID doubted given initial symptoms 8/9, molnupiravir and resolution of symptoms, well before presentation.    However, she has now not responded to appropriate Abx therapy.  RVP negative.  Sputum culture pending.    CT yesterday shows a diffuse GGO opacity, consistent with COVID, or atypical pneumonia or other pneumonitis.    - Consult Pulmonology and ID  Pulm and ID favor that this is "rebound" COVID after molnupiravir in this clearly immunocompromised patient  - Defer remdesivir, steroids, convalescent plasma to Pulm/ID  - Follow blood and sputum cultures  - Stop antibiotics today, completed 5 day course no improvement - Obtain echo - Follow cortisol - Hold MTX - Continue flutter and incentive spirometer     Acute respiratory failure with hypoxia Patient presented with respiratory rates 21 to 33 over the first 2 days in the hospita, now persistently requiring 2L O2   Acute on chronic macrocytic anemia Chronic anemia ~10 g/dL   Here, acutely worse, down to 6.9 g/dL today.  No evidence of hemolysis, no bleeding.  - Follow B12 and folate - Check TSH - Hold methotrexate - Consult hematology, appreciate cares - Trend CBC - Continue folate  - Transfuse 1 unit   Leukopenia - Consult hematology, they recommend watchful waiting, biopsy if worsening over  weekend  Hyponatremia Stable, asymptomatic  Cutaneous lupus erythematosus - Hold MTX  History of breast cancer  Hypothyroidism - Continue levothyroxine  Hypertension Coronary disease BP normal - Continue atorvastatin, losartan, metoprolol - Hold Lasix   Depression - Continue sertraline and trazodone  GERD - Continue PPI            Disposition: Status is: Inpatient  Remains inpatient appropriate because:Inpatient level of care appropriate due to severity of illness and ongoing work up requiring inpatient care  Dispo: The patient is from: Home              Anticipated d/c is to: SNF              Patient currently is not medically stable to d/c.   Difficult to place patient No   Level of care: Med-Surg             MDM: The below labs and imaging reports were reviewed and summarized above.  Medication management as above.    DVT prophylaxis: enoxaparin (LOVENOX) injection 40 mg Start: 05/05/21 2330  Code Status: DNR Family Communication: Son at the bedside             Subjective: Fever again yesterday and today, still with a cough, feeling fatigued, malaise.  No confusion.  No chest pain.  Some aphthous ulcers in her mouth.         Objective: Vitals:   05/09/21 0502 05/09/21 0509 05/09/21 1007 05/09/21 1213  BP:  (!) 140/58 (!) 152/64 132/80  Pulse:  98 96 90  Resp:  _0 Temp:  (!) 100.8 F (38.2 C) 98.7 F (37.1 C) 98.2 F (36.8 C)  TempSrc:  Oral Oral Oral  SpO2: 92% 92% 96% 94%  Weight:      Height:        Intake/Output Summary (Last 24 hours) at 05/09/2021 1257 Last data filed at 05/09/2021 6153 Gross per 24 hour  Intake 800 ml  Output 175 ml  Net 625 ml   Filed Weights   05/05/21 1731  Weight: 65 kg    Examination: General appearance: Elderly female, lying in bed, appears tired and debilitated     HEENT: Oropharynx moist, small aphthous ulcers on cheeks and bilateral sides, no other suspicious  ulcers, no nasal deformity, discharge, or epistaxis.  Conjunctive are normal, lids and lashes normal Skin: Numerous purpura, some scattered red macules, which she indicates is her chronic cutaneous lupus, no new rashes Cardiac: RRR, no murmurs, no lower extremity edema Respiratory: Normal respiratory rate and rhythm, still rales on the right, may be better than yesterday, no wheezing Abdomen: Abdomen soft no tenderness palpation or ascites MSK:  Neuro: Awake and alert, extraocular movements intact, moves all extremities with generalized weakness, speech fluent Psych: Sleepy, attention somewhat diminished, affect appropriate, judgment insight appear slightly impaired      Data Reviewed: I have personally reviewed following labs and imaging studies:  CBC: Recent Labs  Lab 05/05/21 1734 05/06/21 0522 05/07/21 0551 05/08/21 0436 05/09/21 1201  WBC 4.1 3.2* 2.8* 2.3* 2.8*  HGB 9.0* 8.3* 7.2* 7.1* 6.9*  HCT 25.0* 23.4* 20.4* 20.2* 19.4*  MCV 103.7* 105.9* 109.1* 105.2* 107.2*  PLT 138* 121* 131* 137* 794   Basic Metabolic Panel: Recent Labs  Lab 05/05/21 1734 05/06/21 0522 05/07/21 0551 05/08/21 0436  NA 129* 130* 130* 130*  K 4.4 3.8 3.9 4.1  CL 93* 96* 99 100  CO2 _1 GLUCOSE 103* 91 94 103*  BUN _2 CREATININE 0.92 0.76 0.67 0.82  CALCIUM 8.7* 8.0* 7.6* 7.7*  MG  --   --  1.9  --    GFR: Estimated Creatinine Clearance: 46.3 mL/min (by C-G formula based on SCr of 0.82 mg/dL). Liver Function Tests: Recent Labs  Lab 05/08/21 0436  AST 22  ALT 8  ALKPHOS 37*  BILITOT 0.6  PROT 4.6*  ALBUMIN 2.2*   No results for input(s): LIPASE, AMYLASE in the last 168 hours. No results for input(s): AMMONIA in the last 168 hours. Coagulation Profile: No results for input(s): INR, PROTIME in the last 168 hours. Cardiac Enzymes: No results for input(s): CKTOTAL, CKMB, CKMBINDEX, TROPONINI in the last 168 hours. BNP (last 3 results) No results for input(s):  PROBNP in the last 8760 hours. HbA1C: No results for input(s): HGBA1C in the last 72 hours. CBG: No results for input(s): GLUCAP in the last 168 hours. Lipid Profile: No results for input(s): CHOL, HDL, LDLCALC, TRIG, CHOLHDL, LDLDIRECT in the last 72 hours. Thyroid Function Tests: No results for input(s): TSH, T4TOTAL, FREET4, T3FREE, THYROIDAB in the last 72 hours. Anemia Panel: Recent Labs    05/08/21 0436  RETICCTPCT 1.7   Urine analysis:    Component Value Date/Time   COLORURINE YELLOW (A) 05/08/2021 1810   APPEARANCEUR CLEAR (A) 05/08/2021 1810   LABSPEC 1.044 (H) 05/08/2021 1810   PHURINE 5.0 05/08/2021 1810   GLUCOSEU NEGATIVE 05/08/2021 1810   GLUCOSEU NEGATIVE 05/11/2017 1143   HGBUR NEGATIVE 05/08/2021 1810   BILIRUBINUR NEGATIVE 05/08/2021  [place] negative 07/04/2018 1134   KETONESUR 5 (A) 05/08/2021 1810   PROTEINUR NEGATIVE 05/08/2021 1810   UROBILINOGEN 0.2 07/04/2018 1134   UROBILINOGEN 0.2 05/11/2017 1143   NITRITE NEGATIVE 05/08/2021 1810   LEUKOCYTESUR NEGATIVE 05/08/2021 1810   Sepsis Labs: @LABRCNTIP (procalcitonin:4,lacticacidven:4)  ) Recent Results (from the past 240 hour(s))  Resp Panel by RT-PCR (Flu A&B, Covid) Nasopharyngeal Swab     Status: Abnormal   Collection Time: 05/05/21  9:49 PM   Specimen: Nasopharyngeal Swab; Nasopharyngeal(NP) swabs in vial transport medium  Result Value Ref Range Status   SARS Coronavirus 2 by RT PCR POSITIVE (A) NEGATIVE Final    Comment: RESULT CALLED TO, READ BACK BY AND VERIFIED WITH: SANTIA BULLOCK@2330  05/05/21 RH (NOTE) SARS-CoV-2 target nucleic acids are DETECTED.  The SARS-CoV-2 RNA is generally detectable in upper respiratory specimens during the acute phase of infection. Positive results are indicative of the presence of the identified virus, but do not rule out bacterial infection or co-infection with other pathogens not detected by the test. Clinical correlation with patient history  and other diagnostic information is necessary to determine patient infection status. The expected result is Negative.  Fact Sheet for Patients: EntrepreneurPulse.com.au  Fact Sheet for Healthcare Providers: IncredibleEmployment.be  This test is not yet approved or cleared by the [place] FDA and  has been authorized for detection and/or diagnosis of SARS-CoV-2 by FDA under an Emergency Use Authorization (EUA).  This EUA will remain in effect (meaning this test can be [place] ed) for the duration of  the COVID-19 declaration under Section 564(b)(1) of the Act, 21 U.S.C. section 360bbb-3(b)(1), unless the authorization is terminated or revoked sooner.     Influenza A by PCR NEGATIVE NEGATIVE Final   Influenza B by PCR NEGATIVE NEGATIVE Final    Comment: (NOTE) The Xpert Xpress SARS-CoV-2/FLU/RSV plus assay is intended as an aid in the diagnosis of influenza from Nasopharyngeal swab specimens and should not be used as a sole basis for treatment. Nasal washings and aspirates are unacceptable for Xpert Xpress SARS-CoV-2/FLU/RSV testing.  Fact Sheet for Patients: EntrepreneurPulse.com.au  Fact Sheet for Healthcare Providers: IncredibleEmployment.be  This test is not yet approved or cleared by the [place] FDA and has been authorized for detection and/or diagnosis of SARS-CoV-2 by FDA under an Emergency Use Authorization (EUA). This EUA will remain in effect (meaning this test can be used) for the duration of the COVID-19 declaration under Section 564(b)(1) of the Act, 21 U.S.C. section 360bbb-3(b)(1), unless the authorization is terminated or revoked.  Performed at [facility], [address] [place], [place] [address]   Blood culture (routine x 2)     Status: None (Preliminary result)   Collection Time: 05/05/21 10:18 PM   Specimen: BLOOD  Result Value Ref Range Status   Specimen  Description BLOOD RIGHT HAND  Final   Special Requests   Final    BOTTLES DRAWN AEROBIC AND ANAEROBIC Blood Culture adequate volume   Culture   Final    NO GROWTH 4 DAYS Performed at [facility], [address], [place], [place] [address]    Report Status PENDING  Incomplete  Blood culture (routine x 2)     Status: None (Preliminary result)   Collection Time: 05/05/21 11:17 PM   Specimen: BLOOD  Result Value Ref Range Status   Specimen Description BLOOD RIGHT ARM  Final   Special Requests   Final    BOTTLES DRAWN AEROBIC AND ANAEROBIC Blood Culture  adequate volume   Culture   Final    NO GROWTH 4 DAYS Performed at [facility], [address] [place], [place] [address]    Report Status PENDING  Incomplete  MRSA Next Gen by PCR, Nasal     Status: None   Collection Time: 05/07/21  9:16 AM   Specimen: Nasal Mucosa; Nasal Swab  Result Value Ref Range Status   MRSA by PCR Next Gen NOT DETECTED NOT DETECTED Final    Comment: (NOTE) The GeneXpert MRSA Assay (FDA approved for NASAL specimens only), is one component of a comprehensive MRSA colonization surveillance program. It is not intended to diagnose MRSA infection nor to guide or monitor treatment for MRSA infections. Test performance is not FDA approved in patients less than 56 years old. Performed at [facility], [place], [place] [address]   Expectorated Sputum Assessment w Gram Stain, Rflx to Resp Cult     Status: None   Collection Time: 05/07/21  1:18 PM   Specimen: Sputum  Result Value Ref Range Status   Specimen Description SPUTUM  Final   Special Requests NONE  Final   Sputum evaluation   Final    Sputum specimen not acceptable for testing.  Please recollect.   RESULT CALLED TO, READ BACK BY AND VERIFIED WITH: Pat Patrick 05/07/21 1411 KLW Performed at [facility], [address] [place], [place] [address]    Report Status 05/07/2021 FINAL  Final  Respiratory  (~20 pathogens) panel by PCR     Status: None   Collection Time: 05/07/21  4:59 PM   Specimen: Nasopharyngeal Swab; Respiratory  Result Value Ref Range Status   Adenovirus NOT DETECTED NOT DETECTED Final   Coronavirus 229E NOT DETECTED NOT DETECTED Final    Comment: (NOTE) The Coronavirus on the Respiratory Panel, DOES NOT test for the novel  Coronavirus (2019 nCoV)    Coronavirus HKU1 NOT DETECTED NOT DETECTED Final   Coronavirus NL63 NOT DETECTED NOT DETECTED Final   Coronavirus OC43 NOT DETECTED NOT DETECTED Final   Metapneumovirus NOT DETECTED NOT DETECTED Final   Rhinovirus / Enterovirus NOT DETECTED NOT DETECTED Final   Influenza A NOT DETECTED NOT DETECTED Final   Influenza B NOT DETECTED NOT DETECTED Final   Parainfluenza Virus 1 NOT DETECTED NOT DETECTED Final   Parainfluenza Virus 2 NOT DETECTED NOT DETECTED Final   Parainfluenza Virus 3 NOT DETECTED NOT DETECTED Final   Parainfluenza Virus 4 NOT DETECTED NOT DETECTED Final   Respiratory Syncytial Virus NOT DETECTED NOT DETECTED Final   Bordetella pertussis NOT DETECTED NOT DETECTED Final   Bordetella Parapertussis NOT DETECTED NOT DETECTED Final   Chlamydophila pneumoniae NOT DETECTED NOT DETECTED Final   Mycoplasma pneumoniae NOT DETECTED NOT DETECTED Final    Comment: Performed at [facility], [address], [place], [place] [address]  Expectorated Sputum Assessment w Gram Stain, Rflx to Resp Cult     Status: None   Collection Time: 05/07/21  7:19 PM   Specimen: Expectorated Sputum  Result Value Ref Range Status   Specimen Description EXPECTORATED SPUTUM  Final   Special Requests NONE  Final   Sputum evaluation   Final    THIS SPECIMEN IS ACCEPTABLE FOR SPUTUM CULTURE Performed at [facility], [address] [place], [place] [address]    Report Status 05/07/2021 FINAL  Final  Culture, Respiratory w Gram Stain     Status: None (Preliminary result)   Collection Time:  05/07/21  7:19 PM  Result  Value Ref Range Status   Specimen Description   Final    EXPECTORATED SPUTUM Performed at [facility], [address] [place], [place] [address]    Special Requests   Final    NONE Reflexed from [id number] Performed at [address] [place], [place] [address]    Gram Stain   Final    NO WBC SEEN RARE GRAM POSITIVE COCCI IN PAIRS RARE GRAM POSITIVE RODS RARE BUDDING YEAST SEEN    Culture   Final    CULTURE REINCUBATED FOR BETTER GROWTH Performed at [facility], [place] [address], [place], [place] [address]    Report Status PENDING  Incomplete         Radiology Studies: CT CHEST W CONTRAST  Result Date: 05/08/2021 CLINICAL DATA:  86 year old female with history of pneumonia noted on recent chest x-ray. COVID positive patient. Evaluate for pneumonia versus interstitial lung disease. History of Sjogren's disease and cutaneous lupus. EXAM: CT CHEST WITH CONTRAST TECHNIQUE: Multidetector CT imaging of the chest was performed during intravenous contrast administration. CONTRAST:  4mL OMNIPAQUE IOHEXOL 350 MG/ML SOLN COMPARISON:  Chest CT 05/05/2021. FINDINGS: Cardiovascular: Heart size is normal. There is no significant pericardial fluid, thickening or pericardial calcification. There is aortic atherosclerosis, as well as atherosclerosis of the great vessels of the mediastinum and the coronary arteries, including calcified atherosclerotic plaque in the left anterior descending and right coronary arteries. Calcifications of the mitral annulus. Mediastinum/Nodes: Multiple prominent borderline enlarged mediastinal lymph nodes are noted, nonspecific and likely reactive. No definite pathologically enlarged mediastinal or hilar lymph nodes. Small hiatal hernia. No axillary lymphadenopathy. Lungs/Pleura: Widespread but patchy areas of ground-glass attenuation, interlobular septal thickening and thickening of the peribronchovascular interstitium are noted  throughout all aspects of the lungs bilaterally, indicative of severe multilobar bilateral bronchopneumonia, presumably secondary to reported COVID infection. Trace bilateral pleural effusions layer dependently. Upper Abdomen: Aortic atherosclerosis.  Status post cholecystectomy. Musculoskeletal: Status post left modified radical mastectomy. Chronic compression fracture of L1 with 25% loss of anterior vertebral body height and post vertebroplasty changes. There are no aggressive appearing lytic or blastic lesions noted in the visualized portions of the skeleton. IMPRESSION: 1. Severe multilobar bilateral bronchopneumonia, presumably secondary to reported COVID infection. 2. Trace bilateral pleural effusions. 3. Small hiatal hernia. 4. Aortic atherosclerosis, in addition to 2 vessel coronary artery disease. Aortic Atherosclerosis (ICD10-I70.0). Electronically Signed   By: Vinnie Langton M.D.   On: 05/08/2021 16:15        Scheduled Meds:  sodium chloride   Intravenous Once   acidophilus  1 capsule Oral Daily   atorvastatin  20 mg Oral Daily   calcium-vitamin D  2 tablet Oral Daily   cholecalciferol  2,000 Units Oral Daily   enoxaparin (LOVENOX) injection  40 mg Subcutaneous S01U   folic acid  1 mg Oral Daily   guaiFENesin  600 mg Oral BID   levothyroxine  75 mcg Oral Q0600   losartan  25 mg Oral Daily   metoprolol succinate  25 mg Oral Daily   pantoprazole  40 mg Oral Daily   polyethylene glycol  17 g Oral Daily   sertraline  25 mg Oral Daily   traZODone  50 mg Oral QHS   Continuous Infusions:  sodium chloride 100 mL/hr at 05/08/21 0642   azithromycin 500 mg (05/08/21 2314)   cefTRIAXone (ROCEPHIN)  IV Stopped (05/08/21 2225)     LOS: 4 days  Time spent: 35 minutes    Edwin Dada, MD [facility] 05/09/2021, 12:57 PM     Please page though [place] or Epic secure chat:  For [employer], Adult nurse

## 2021-05-09 NOTE — Consult Note (Signed)
Pulmonary Medicine          Date: 05/09/2021,   MRN# [id number] Kim Santiago March 22, 1935     AdmissionWeight: 65 kg                 CurrentWeight: 65 kg   Referring physician: Dr. Loleta Books   CHIEF COMPLAINT:   Refractory pneumonia with with complex comorbid history   HISTORY OF PRESENT ILLNESS   This is a pleasant 86 year old female she has a history of osteoarthritis, breast cancer with left mastectomy in 2017 as well as lymph node resection, chronic kidney disease, history of MI, essential hypertension, hypothyroidism post thyroidectomy due to thyroid cancer, has a history of lupus with Sjogren's overlap, she is postmenopausal, she does have osteoarthritis, systolic heart failure with ejection fraction of 35%, who initially came into the emergency room with complaints of malaise post COVID-19 4 weeks ago.  She does have residual cough and states that intermittent expectoration of phlegm with yellow discoloration is ongoing with possible progression.  She denied GI symptoms specifically nausea vomiting and diarrhea was absent, she denied chest pain falls, confusion, seizure activity, visual impairment.  She did have immunization with COVID-19 with a total of 3 doses.  She was tachycardic on arrival with hyponatremia at 129 mild lactic acidosis chronic anemia with a hemoglobin of 9 and had another COVID-19 test when she came in which was positive still from previous.  For her systemic inflammatory autoimmune disease she does take methotrexate 15 mg once weekly.  Respiratory cultures were done which are negative at this time, also patient had non-COVID respiratory viral panel with 20 organisms reviewed all negative.  There is serum Fungitell in process. Patient remains febrile. Procalcitonin was also done which was essentially normal.  PCCM consultation placed for additional evaluation management.   PAST MEDICAL HISTORY   Past Medical History:  Diagnosis Date   Arthritis     Breast cancer ([place]) 2017   left mastectomy done 11/2015   Breast cancer in female [facility]) 11/18/2015   Left: 3.9 cm tumor, T2, 1/2 sentinel nodes positive for macro metastatic disease, N1, 3 negative nodes in the axillary tail, ER+,PR+, Her 2 neu, low Mammoprint score   Cancer ([facility]CC)    thyroid takes levothyroxine   Chronic kidney disease    UTI   Genetic screening March 2017.   Mammoprint of left breast cancer: Low risk for recurrence.    History of heart attack 06/12/2014   Hypertension    hypothyroidism    secondary to thyroidectomy for thyroid ca   Hypothyroidism    Lupus ([place])    subcutaneous   Menopause 40s   natural, hot flashes and mood lability now gone, off prempro 7 months   Myocardial infarction [facility]) 2013   Osteoporosis    Osteopenia   Rosacea    Sjoegren syndrome    Stress-induced cardiomyopathy September of 2013   EF 35%. Peak troponin was 1.8.     SURGICAL HISTORY   Past Surgical History:  Procedure Laterality Date   BACK SURGERY     BREAST BIOPSY Left 10/30/15   positive, done in Dr. Dwyane Luo office   CARDIAC CATHETERIZATION  05/2012   [place]. No significant CAD. Ejection fraction of 35% due to stress-induced cardiomyopathy.   CHOLECYSTECTOMY     COLONOSCOPY     DILATION AND CURETTAGE OF UTERUS     KYPHOSIS SURGERY  Feb 2008   L1, Dr. Mauri Pole   LUMBAR DISC SURGERY  L4-L5   MASTECTOMY Left 11/18/2015   positive   SENTINEL NODE BIOPSY Left 11/18/2015   Procedure: SENTINEL NODE BIOPSY;  Surgeon: Robert Bellow, MD;  Location: [facility] ORS;  Service: General;  Laterality: Left;   SHOULDER ARTHROSCOPY  2004   Left, Dr. Jefm Bryant   SIMPLE MASTECTOMY WITH AXILLARY SENTINEL NODE BIOPSY Left 11/18/2015   Procedure: SIMPLE MASTECTOMY;  Surgeon: Robert Bellow, MD;  Location: [facility] ORS;  Service: General;  Laterality: Left;   SPINE SURGERY     L4-5 diskectomy   THYROIDECTOMY     Thyroid Cancer   TONSILLECTOMY     TUBAL LIGATION       FAMILY HISTORY    Family History  Problem Relation Age of Onset   Kidney disease Mother    Heart disease Mother    COPD Father    Cancer Father        esophageal   Kidney disease Sister    Cancer Brother 39       colon cancer (both brothers)   Heart attack Brother 52   Heart disease Brother    Breast cancer Neg Hx      SOCIAL HISTORY   Social History   Tobacco Use   Smoking status: Never   Smokeless tobacco: Never  Vaping Use   Vaping Use: Never used  Substance Use Topics   Alcohol use: No   Drug use: No     MEDICATIONS    Home Medication:    Current Medication:  Current Facility-Administered Medications:    0.9 %  sodium chloride infusion, , Intravenous, Continuous, Danford, Suann Larry, MD, Last Rate: 100 mL/hr at 05/08/21 0642, New Bag at 05/08/21 [id number]   acetaminophen (TYLENOL) tablet 650 mg, 650 mg, Oral, Q6H PRN, 650 mg at 05/09/21 0436 **OR** acetaminophen (TYLENOL) suppository 650 mg, 650 mg, Rectal, Q6H PRN, Mansy, Jan A, MD   acidophilus (RISAQUAD) capsule 1 capsule, 1 capsule, Oral, Daily, Mansy, Jan A, MD, 1 capsule at 05/08/21 1135   atorvastatin (LIPITOR) tablet 20 mg, 20 mg, Oral, Daily, Mansy, Jan A, MD, 20 mg at 05/08/21 1135   azithromycin (ZITHROMAX) 500 mg in sodium chloride 0.9 % 250 mL IVPB, 500 mg, Intravenous, Q24H, Mansy, Jan A, MD, Last Rate: 250 mL/hr at 05/08/21 2314, 500 mg at 05/08/21 2314   calcium-vitamin D (OSCAL WITH D) 500-200 MG-UNIT per tablet 2 tablet, 2 tablet, Oral, Daily, Mansy, Jan A, MD, 2 tablet at 05/08/21 1135   cefTRIAXone (ROCEPHIN) 2 g in sodium chloride 0.9 % 100 mL IVPB, 2 g, Intravenous, Q24H, Mansy, Jan A, MD, Stopped at 05/08/21 2225   cholecalciferol (VITAMIN D3) tablet 2,000 Units, 2,000 Units, Oral, Daily, Mansy, Jan A, MD, 2,000 Units at 05/08/21 1135   enoxaparin (LOVENOX) injection 40 mg, 40 mg, Subcutaneous, Q24H, Mansy, Jan A, MD, 40 mg at [id number]   folic acid (FOLVITE) tablet 1 mg, 1 mg, Oral, Daily, Mansy, Jan A,  MD, 1 mg at 05/08/21 1135   guaiFENesin (MUCINEX) 12 hr tablet 600 mg, 600 mg, Oral, BID, Mansy, Jan A, MD, 600 mg at 05/08/21 2142   levothyroxine (SYNTHROID) tablet 75 mcg, 75 mcg, Oral, Q0600, Mansy, Jan A, MD, 75 mcg at 05/09/21 0514   losartan (COZAAR) tablet 25 mg, 25 mg, Oral, Daily, Mansy, Jan A, MD, 25 mg at 05/08/21 1135   magnesium hydroxide (MILK OF MAGNESIA) suspension 30 mL, 30 mL, Oral, Daily PRN, Mansy, Jan A, MD   metoprolol succinate (TOPROL-XL) 24 hr  tablet 25 mg, 25 mg, Oral, Daily, Mansy, Jan A, MD, 25 mg at 05/08/21 1135   nitroGLYCERIN (NITROSTAT) SL tablet 0.4 mg, 0.4 mg, Sublingual, Q5 min PRN, Mansy, Jan A, MD   ondansetron (ZOFRAN) tablet 4 mg, 4 mg, Oral, Q6H PRN **OR** ondansetron (ZOFRAN) injection 4 mg, 4 mg, Intravenous, Q6H PRN, Mansy, Jan A, MD, 4 mg at 05/08/21 [id number]   pantoprazole (PROTONIX) EC tablet 40 mg, 40 mg, Oral, Daily, Mansy, Jan A, MD, 40 mg at 05/08/21 1135   polyethylene glycol (MIRALAX / GLYCOLAX) packet 17 g, 17 g, Oral, Daily, Mansy, Jan A, MD, 17 g at 05/08/21 1135   sertraline (ZOLOFT) tablet 25 mg, 25 mg, Oral, Daily, Mansy, Jan A, MD, 25 mg at 05/08/21 1135   tiZANidine (ZANAFLEX) tablet 2 mg, 2 mg, Oral, TID PRN, Ezekiel Slocumb, DO   traZODone (DESYREL) tablet 25 mg, 25 mg, Oral, QHS PRN, Mansy, Jan A, MD   traZODone (DESYREL) tablet 50 mg, 50 mg, Oral, QHS, Mansy, Jan A, MD, 50 mg at 05/08/21 2142    ALLERGIES   Azathioprine, Hydroxychloroquine, Mycophenolate mofetil, Amoxicillin, Codeine, Naprosyn [naproxen], Orudis [ketoprofen], Sulfa antibiotics, and Sulfathiazole     REVIEW OF SYSTEMS    Review of Systems:  Gen:  Denies  fever, sweats, chills weigh loss  HEENT: Denies blurred vision, double vision, ear pain, eye pain, hearing loss, nose bleeds, sore throat Cardiac:  No dizziness, chest pain or heaviness, chest tightness,edema Resp:   Denies cough or sputum porduction, shortness of breath,wheezing, hemoptysis,  Gi: Denies  swallowing difficulty, stomach pain, nausea or vomiting, diarrhea, constipation, bowel incontinence Gu:  Denies bladder incontinence, burning urine Ext:   Denies Joint pain, stiffness or swelling Skin: Denies  skin rash, easy bruising or bleeding or hives Endoc:  Denies polyuria, polydipsia , polyphagia or weight change Psych:   Denies depression, insomnia or hallucinations   Other:  All other systems negative   VS: BP (!) 140/58 (BP Location: Right Arm)   Pulse 98   Temp (!) 100.8 F (38.2 C) (Oral)   Resp 18   Ht '5\' 3"'$  (1.6 m)   Wt 65 kg   SpO2 92%   BMI 25.38 kg/m      PHYSICAL EXAM    GENERAL:NAD, no fevers, chills, no weakness no fatigue HEAD: Normocephalic, atraumatic.  EYES: Pupils equal, round, reactive to light. Extraocular muscles intact. No scleral icterus.  MOUTH: Moist mucosal membrane. Dentition intact. No abscess noted.  EAR, NOSE, THROAT: Clear without exudates. No external lesions.  NECK: Supple. No thyromegaly. No nodules. No JVD.  PULMONARY: Crackles and mild rhonchorous sounds worse at left lower base CARDIOVASCULAR: S1 and S2. Regular rate and rhythm. No murmurs, rubs, or gallops. No edema. Pedal pulses 2+ bilaterally.  GASTROINTESTINAL: Soft, nontender, nondistended. No masses. Positive bowel sounds. No hepatosplenomegaly.  MUSCULOSKELETAL: No swelling, clubbing, or edema. Range of motion full in all extremities.  NEUROLOGIC: Cranial nerves II through XII are intact. No gross focal neurological deficits. Sensation intact. Reflexes intact.  SKIN: No ulceration, lesions, rashes, or cyanosis. Skin warm and dry. Turgor intact.  PSYCHIATRIC: Mood, affect within normal limits. The patient is awake, alert and oriented x 3. Insight, judgment intact.       IMAGING    CT CHEST W CONTRAST  Result Date: 05/08/2021 CLINICAL DATA:  86 year old female with history of pneumonia noted on recent chest x-ray. COVID positive patient. Evaluate for pneumonia versus  interstitial lung disease. History of Sjogren's disease and  cutaneous lupus. EXAM: CT CHEST WITH CONTRAST TECHNIQUE: Multidetector CT imaging of the chest was performed during intravenous contrast administration. CONTRAST:  1m OMNIPAQUE IOHEXOL 350 MG/ML SOLN COMPARISON:  Chest CT 05/05/2021. FINDINGS: Cardiovascular: Heart size is normal. There is no significant pericardial fluid, thickening or pericardial calcification. There is aortic atherosclerosis, as well as atherosclerosis of the great vessels of the mediastinum and the coronary arteries, including calcified atherosclerotic plaque in the left anterior descending and right coronary arteries. Calcifications of the mitral annulus. Mediastinum/Nodes: Multiple prominent borderline enlarged mediastinal lymph nodes are noted, nonspecific and likely reactive. No definite pathologically enlarged mediastinal or hilar lymph nodes. Small hiatal hernia. No axillary lymphadenopathy. Lungs/Pleura: Widespread but patchy areas of ground-glass attenuation, interlobular septal thickening and thickening of the peribronchovascular interstitium are noted throughout all aspects of the lungs bilaterally, indicative of severe multilobar bilateral bronchopneumonia, presumably secondary to reported COVID infection. Trace bilateral pleural effusions layer dependently. Upper Abdomen: Aortic atherosclerosis.  Status post cholecystectomy. Musculoskeletal: Status post left modified radical mastectomy. Chronic compression fracture of L1 with 25% loss of anterior vertebral body height and post vertebroplasty changes. There are no aggressive appearing lytic or blastic lesions noted in the visualized portions of the skeleton. IMPRESSION: 1. Severe multilobar bilateral bronchopneumonia, presumably secondary to reported COVID infection. 2. Trace bilateral pleural effusions. 3. Small hiatal hernia. 4. Aortic atherosclerosis, in addition to 2 vessel coronary artery disease. Aortic  Atherosclerosis (ICD10-I70.0). Electronically Signed   By: DVinnie LangtonM.D.   On: 05/08/2021 16:15   DG Chest Portable 1 View  Result Date: 05/05/2021 CLINICAL DATA:  Weakness, COVID positive EXAM: PORTABLE CHEST 1 VIEW COMPARISON:  02/09/2018 FINDINGS: Mild patchy opacities in the right upper lobe and right mid lung. Mild left basilar opacity, possibly atelectasis. No pleural effusion or pneumothorax. Partial eventration of the right hemidiaphragm. The heart is normal in size. Suspected healed right lateral clavicle fracture. IMPRESSION: Mild patchy opacities in the right lung, compatible with pneumonia in this patient with known COVID. Electronically Signed   By: SJulian HyM.D.   On: 05/05/2021 21:22      ASSESSMENT/PLAN   Acute hypoxemic respiratory failure -Patient had COVID-19 4 weeks ago.  Due to relative immunocompromise state with methotrexate she is at high risk for long COVID syndrome, additionally cycle threshold value is 30.3 indicative of active replication of virus and ongoing infection.  Recommendation for repeated treatment and consideration of high titer convalescent plasma available for these cases -Agree with Fungitell testing as patient is at increased risk for fungal infection -Review of CT chest above does show bilateral pulmonary congestion with edema and atelectasis worse at the left lower lobe.  Patient has history of CHF with previous echo showing EF of 35 as well as another echo showing EF of 65 with diastolic dysfunction.  We will repeat transthoracic echo as well as obtain BNP today.  Patient may benefit from diuresis if possible. -Discussed with ID and placed consultation for High titer convalescent plasma   Bibasilar atelectasis -upgrade recruitment to metaNEB with albuterol BID with RT -may not be able to do due to COVID precautions but can attempt VEST -continue IS and flutter    Chronic systolic and diastolic CHF   - patient with 1+ pedal edema    -will repeat TTE and BNP today   Thank you for allowing me to participate in the care of this patient.  Total face to face encounter time for this patient visit was 45 min. >50% of the time  was  spent in counseling and coordination of care.   Patient/Family are satisfied with care plan and all questions have been answered.  This document was prepared using Dragon voice recognition software and may include unintentional dictation errors.     Ottie Glazier, M.D.  Division of [place]

## 2021-05-09 NOTE — Progress Notes (Signed)
   05/09/21 1514  Assess: MEWS Score  Temp (!) 100.8 F (38.2 C)  BP (!) 160/62  Pulse Rate (!) 122  Resp 16  Level of Consciousness Alert  SpO2 94 %  O2 Device Nasal Cannula  Assess: MEWS Score  MEWS Temp 1  MEWS Systolic 0  MEWS Pulse 2  MEWS RR 0  MEWS LOC 0  MEWS Score 3  MEWS Score Color Yellow  Assess: if the MEWS score is Yellow or Red  Were vital signs taken at a resting state? Yes  Focused Assessment No change from prior assessment  Does the patient meet 2 or more of the SIRS criteria? No  Does the patient have a confirmed or suspected source of infection? Yes  Provider and Rapid Response Notified? No  MEWS guidelines implemented *See Row Information* No, previously yellow, continue vital signs every 4 hours  Treat  Pain Scale 0-10  Pain Score 0  Document  Patient Outcome Stabilized after interventions  Progress note created (see row info) Yes  Assess: SIRS CRITERIA  SIRS Temperature  0  SIRS Pulse 1  SIRS Respirations  0  SIRS WBC 0  SIRS Score Sum  1

## 2021-05-09 NOTE — Consult Note (Addendum)
NAME: Kim Santiago  DOB: [birth year]  MRN: [id number]  Date/Time: 05/09/2021 12:25 PM  REQUESTING PROVIDER: Deniece Portela Subjective:  REASON FOR CONSULT: Fever/covid/pulmonary infiltrate ? Kim Santiago is a 86 y.o. female with a history of cutaneous lupus on methotrexate and just finished a 7 week prednisone taper, c breast s/p mastectomy and was on letrizole for 5 years Recent covid illness presented to the ED on 05/05/21 with severe weakness As per daughter at bedside and chart review and talking to patient she tested positive for COVID by home test on 04/15/21- Her PCP did a video visit on 8/11 and prescribed Molnupravir which she took for 5 days. She was also taking a tapering dose of high dose steroids which her dermatologist gave on 03/21/21 for a cutaneous lupus flare up and she completed that 05/02/21. She took a dose of her weekly methotrexate on 05/04/21. She has been feeling up and down since covid- some day are good and others are bad- She did not have fever at home , but since being in the hospital she has had fever,worsening SOB No travel She has the priamry series and 1 booster covid vaccine- last dose was nearly a year ago CT scan showed GGO- seen by [facility] pulmonologist who would like to start treatment for covid He has consulted me for the fever and pulmonary infiltrate    Past Medical History:  Diagnosis Date   Arthritis    Breast cancer ([place]) 2017   left mastectomy done 11/2015   Breast cancer in female [facility]) 11/18/2015   Left: 3.9 cm tumor, T2, 1/2 sentinel nodes positive for macro metastatic disease, N1, 3 negative nodes in the axillary tail, ER+,PR+, Her 2 neu, low Mammoprint score   Cancer ([place])    thyroid takes levothyroxine   Chronic kidney disease    UTI   Genetic screening March 2017.   Mammoprint of left breast cancer: Low risk for recurrence.    History of heart attack 06/12/2014   Hypertension    hypothyroidism    secondary to thyroidectomy for  thyroid ca   Hypothyroidism    Lupus ([place])    subcutaneous   Menopause 40s   natural, hot flashes and mood lability now gone, off prempro 7 months   Myocardial infarction [facility]) 2013   Osteoporosis    Osteopenia   Rosacea    Sjoegren syndrome    Stress-induced cardiomyopathy September of 2013   EF 35%. Peak troponin was 1.8.    Past Surgical History:  Procedure Laterality Date   BACK SURGERY     BREAST BIOPSY Left 10/30/15   positive, done in Dr. Dwyane Luo office   CARDIAC CATHETERIZATION  05/2012   [place]. No significant CAD. Ejection fraction of 35% due to stress-induced cardiomyopathy.   CHOLECYSTECTOMY     COLONOSCOPY     DILATION AND CURETTAGE OF UTERUS     KYPHOSIS SURGERY  Feb 2008   L1, Dr. Mauri Pole   LUMBAR DISC SURGERY     L4-L5   MASTECTOMY Left 11/18/2015   positive   SENTINEL NODE BIOPSY Left 11/18/2015   Procedure: SENTINEL NODE BIOPSY;  Surgeon: Robert Bellow, MD;  Location: [facility] ORS;  Service: General;  Laterality: Left;   SHOULDER ARTHROSCOPY  2004   Left, Dr. Jefm Bryant   SIMPLE MASTECTOMY WITH AXILLARY SENTINEL NODE BIOPSY Left 11/18/2015   Procedure: SIMPLE MASTECTOMY;  Surgeon: Robert Bellow, MD;  Location: [facility] ORS;  Service: General;  Laterality: Left;   SPINE SURGERY  L4-5 diskectomy   THYROIDECTOMY     Thyroid Cancer   TONSILLECTOMY     TUBAL LIGATION      Social History   Socioeconomic History   Marital status: Widowed    Spouse name: Not on file   Number of children: 1   Years of education: college   Highest education level: Not on file  Occupational History   Not on file  Tobacco Use   Smoking status: Never   Smokeless tobacco: Never  Vaping Use   Vaping Use: Never used  Substance and Sexual Activity   Alcohol use: No   Drug use: No   Sexual activity: Never  Other Topics Concern   Not on file  Social History Narrative   Has 1 adopted daughter.   Social Determinants of Health   Financial Resource Strain: Low Risk     Difficulty of Paying Living Expenses: Not hard at all  Food Insecurity: No Food Insecurity   Worried About Charity fundraiser in the Last Year: Never true   [place] in the Last Year: Never true  Transportation Needs: No Transportation Needs   Lack of Transportation (Medical): No   Lack of Transportation (Non-Medical): No  Physical Activity: Insufficiently Active   Days of Exercise per Week: 3 days   Minutes of Exercise per Session: 30 min  Stress: No Stress Concern Present   Feeling of Stress : Not at all  Social Connections: Unknown   Frequency of Communication with Friends and Family: More than three times a week   Frequency of Social Gatherings with Friends and Family: More than three times a week   Attends Religious Services: Not on Electrical engineer or Organizations: Not on file   Attends Archivist Meetings: Not on file   Marital Status: Not on file  Intimate Partner Violence: Not At Risk   Fear of Current or Ex-Partner: No   Emotionally Abused: No   Physically Abused: No   Sexually Abused: No    Family History  Problem Relation Age of Onset   Kidney disease Mother    Heart disease Mother    COPD Father    Cancer Father        esophageal   Kidney disease Sister    Cancer Brother 33       colon cancer (both brothers)   Heart attack Brother 57   Heart disease Brother    Breast cancer Neg Hx    Allergies  Allergen Reactions   Azathioprine Nausea And Vomiting    Severe vomiting   Hydroxychloroquine Hives and Nausea And Vomiting   Mycophenolate Mofetil Nausea Only   Amoxicillin Rash    Has patient had a PCN reaction causing immediate rash, facial/tongue/throat swelling, SOB or lightheadedness with hypotension: Unknown Has patient had a PCN reaction causing severe rash involving mucus membranes or skin necrosis: Unknown Has patient had a PCN reaction that required hospitalization: Unknown Has patient had a PCN reaction occurring within  the last 10 years: Unknown If all of the above answers are "NO", then may proceed with Cephalosporin use. Tolerates ceftriaxone and Ancef   Codeine Nausea And Vomiting   Naprosyn [Naproxen] Swelling   Orudis [Ketoprofen] Hives   Sulfa Antibiotics Rash   Sulfathiazole Rash   I? Current Facility-Administered Medications  Medication Dose Route Frequency Provider Last Rate Last Admin   0.9 %  sodium chloride infusion (Manually program via Guardrails IV Fluids)  Intravenous Once Ottie Glazier, MD       0.9 %  sodium chloride infusion   Intravenous Continuous Edwin Dada, MD 100 mL/hr at 05/08/21 0642 New Bag at 05/08/21 0642   acetaminophen (TYLENOL) tablet 650 mg  650 mg Oral Q6H PRN Mansy, Jan A, MD   650 mg at 05/09/21 0436   Or   acetaminophen (TYLENOL) suppository 650 mg  650 mg Rectal Q6H PRN Mansy, Jan A, MD       acidophilus (RISAQUAD) capsule 1 capsule  1 capsule Oral Daily Mansy, Jan A, MD   1 capsule at 05/09/21 1004   atorvastatin (LIPITOR) tablet 20 mg  20 mg Oral Daily Mansy, Jan A, MD   20 mg at 05/09/21 1004   azithromycin (ZITHROMAX) 500 mg in sodium chloride 0.9 % 250 mL IVPB  500 mg Intravenous Q24H Mansy, Jan A, MD 250 mL/hr at 05/08/21 2314 500 mg at 05/08/21 2314   calcium-vitamin D (OSCAL WITH D) 500-200 MG-UNIT per tablet 2 tablet  2 tablet Oral Daily Mansy, Jan A, MD   2 tablet at 05/09/21 1006   cefTRIAXone (ROCEPHIN) 2 g in sodium chloride 0.9 % 100 mL IVPB  2 g Intravenous Q24H Mansy, Jan A, MD   Stopped at 05/08/21 2225   cholecalciferol (VITAMIN D3) tablet 2,000 Units  2,000 Units Oral Daily Mansy, Jan A, MD   2,000 Units at 05/09/21 1004   enoxaparin (LOVENOX) injection 40 mg  40 mg Subcutaneous Q24H Mansy, Jan A, MD   40 mg at 38/46/65 9935   folic acid (FOLVITE) tablet 1 mg  1 mg Oral Daily Mansy, Jan A, MD   1 mg at 05/09/21 1007   guaiFENesin (MUCINEX) 12 hr tablet 600 mg  600 mg Oral BID Mansy, Jan A, MD   600 mg at 05/09/21 1007   levothyroxine  (SYNTHROID) tablet 75 mcg  75 mcg Oral Q0600 Mansy, Jan A, MD   75 mcg at 05/09/21 0514   losartan (COZAAR) tablet 25 mg  25 mg Oral Daily Mansy, Jan A, MD   25 mg at 05/09/21 1004   magnesium hydroxide (MILK OF MAGNESIA) suspension 30 mL  30 mL Oral Daily PRN Mansy, Jan A, MD       metoprolol succinate (TOPROL-XL) 24 hr tablet 25 mg  25 mg Oral Daily Mansy, Jan A, MD   25 mg at 05/09/21 1007   nitroGLYCERIN (NITROSTAT) SL tablet 0.4 mg  0.4 mg Sublingual Q5 min PRN Mansy, Jan A, MD       ondansetron [facility]) tablet 4 mg  4 mg Oral Q6H PRN Mansy, Jan A, MD       Or   ondansetron [facility]) injection 4 mg  4 mg Intravenous Q6H PRN Mansy, Jan A, MD   4 mg at 05/08/21 0738   pantoprazole (PROTONIX) EC tablet 40 mg  40 mg Oral Daily Mansy, Jan A, MD   40 mg at 05/09/21 1004   polyethylene glycol (MIRALAX / GLYCOLAX) packet 17 g  17 g Oral Daily Mansy, Jan A, MD   17 g at 05/09/21 1003   sertraline (ZOLOFT) tablet 25 mg  25 mg Oral Daily Mansy, Jan A, MD   25 mg at 05/09/21 1007   tiZANidine (ZANAFLEX) tablet 2 mg  2 mg Oral TID PRN Nicole Kindred A, DO       traZODone (DESYREL) tablet 25 mg  25 mg Oral QHS PRN Mansy, Arvella Merles, MD  traZODone (DESYREL) tablet 50 mg  50 mg Oral QHS Mansy, Jan A, MD   50 mg at 05/08/21 2142     Abtx:  Anti-infectives (From admission, onward)    Start     Dose/Rate Route Frequency Ordered Stop   05/05/21 2330  cefTRIAXone (ROCEPHIN) 2 g in sodium chloride 0.9 % 100 mL IVPB        2 g 200 mL/hr over 30 Minutes Intravenous Every 24 hours 05/05/21 2320 05/10/21 2329   05/05/21 2330  azithromycin (ZITHROMAX) 500 mg in sodium chloride 0.9 % 250 mL IVPB        500 mg 250 mL/hr over 60 Minutes Intravenous Every 24 hours 05/05/21 2320 05/10/21 2329   05/05/21 2230  cefTRIAXone (ROCEPHIN) 1 g in sodium chloride 0.9 % 100 mL IVPB  Status:  Discontinued        1 g 200 mL/hr over 30 Minutes Intravenous  Once 05/05/21 2226 05/05/21 2359   05/05/21 2230  azithromycin  (ZITHROMAX) 500 mg in sodium chloride 0.9 % 250 mL IVPB  Status:  Discontinued        500 mg 250 mL/hr over 60 Minutes Intravenous  Once 05/05/21 2226 05/05/21 2324       REVIEW OF SYSTEMS:  Const:  fever, chills, negative weight loss Eyes: negative diplopia or visual changes, negative eye pain ENT: negative coryza, negative sore throat Resp:  cough, h, dyspnea Cards: negative for chest pain, palpitations, lower extremity edema GU: negative for frequency, dysuria and hematuria GI: Negative for abdominal pain, diarrhea, bleeding, constipation Skin: negative for rash and pruritus Heme: negative for easy bruising and gum/nose bleeding MS: generalized weakness Neurolo:negative for headaches, dizziness, vertigo, memory problems  Psych: negative for feelings of anxiety, depression  Endocrine: hypothyroidism following thyroidectomy for cancer Allergy/Immunology- many drugs including amoxicillin and azithromycin Objective:  VITALS:  BP 132/80 (BP Location: Right Arm)   Pulse 90   Temp 98.2 F (36.8 C) (Oral)   Resp 18   Ht 5' 3" (1.6 m)   Wt 65 kg   SpO2 94%   BMI 25.38 kg/m  PHYSICAL EXAM:  General: Alert, cooperative, no distress at rest , pale weak  Head: Normocephalic, without obvious abnormality, atraumatic. Eyes: Conjunctivae clear, anicteric sclerae. Pupils are equal ENT Nares normal. No drainage or sinus tenderness. Lips, mucosa, and tongue normal. No Thrush Neck: Supple, symmetrical, no adenopathy, thyroid: non tender no carotid bruit and no JVD. Back: No CVA tenderness. Lungs: b/l air entry- few rhonchi and crepts Heart: s1s2 Abdomen: Soft, non-tender,not distended. Bowel sounds normal. No masses Extremities: atraumatic, no cyanosis. No edema. No clubbing Skin: purpura and bruising arms  Neck fading erythematous rash Lymph: Cervical, supraclavicular normal. Neurologic: Grossly non-focal Pertinent Labs Lab Results CBC    Component Value Date/Time   WBC 2.3  (L) 05/08/2021 0436   RBC 1.87 (L) 05/08/2021 0436   RBC 1.92 (L) 05/08/2021 0436   HGB 7.1 (L) 05/08/2021 0436   HGB 13.2 05/31/2012 0338   HCT 20.2 (L) 05/08/2021 0436   HCT 38.6 05/31/2012 0338   PLT 137 (L) 05/08/2021 0436   PLT 229 05/31/2012 0338   MCV 105.2 (H) 05/08/2021 0436   MCV 87 05/31/2012 0338   MCH 37.0 (H) 05/08/2021 0436   MCHC 35.1 05/08/2021 0436   RDW 18.2 (H) 05/08/2021 0436   RDW 13.3 05/31/2012 0338   LYMPHSABS 1.0 02/25/2021 1643   LYMPHSABS 2.5 05/31/2012 0338   MONOABS 0.2 02/25/2021 1643   MONOABS 0.7  05/31/2012 0338   EOSABS 0.2 02/25/2021 1643   EOSABS 0.3 05/31/2012 0338   BASOSABS 0.0 02/25/2021 1643   BASOSABS 0.1 05/31/2012 0338    CMP Latest Ref Rng & Units 05/08/2021 05/07/2021 05/06/2021  Glucose 70 - 99 mg/dL 103(H) 94 91  BUN 8 - 23 mg/dL _0 Creatinine 0.44 - 1.00 mg/dL 0.82 0.67 0.76  Sodium 135 - 145 mmol/L 130(L) 130(L) 130(L)  Potassium 3.5 - 5.1 mmol/L 4.1 3.9 3.8  Chloride 98 - 111 mmol/L 100 99 96(L)  CO2 22 - 32 mmol/L _1 Calcium 8.9 - 10.3 mg/dL 7.7(L) 7.6(L) 8.0(L)  Total Protein 6.5 - 8.1 g/dL 4.6(L) - -  Total Bilirubin 0.3 - 1.2 mg/dL 0.6 - -  Alkaline Phos 38 - 126 U/L 37(L) - -  AST 15 - 41 U/L 22 - -  ALT 0 - 44 U/L 8 - -      Microbiology: Recent Results (from the past 240 hour(s))  Resp Panel by RT-PCR (Flu A&B, Covid) Nasopharyngeal Swab     Status: Abnormal   Collection Time: 05/05/21  9:49 PM   Specimen: Nasopharyngeal Swab; Nasopharyngeal(NP) swabs in vial transport medium  Result Value Ref Range Status   SARS Coronavirus 2 by RT PCR POSITIVE (A) NEGATIVE Final    Comment: RESULT CALLED TO, READ BACK BY AND VERIFIED WITH: SANTIA [email or web address]  05/05/21 RH (NOTE) SARS-CoV-2 target nucleic acids are DETECTED.  The SARS-CoV-2 RNA is generally detectable in upper respiratory specimens during the acute phase of infection. Positive results are indicative of the presence of the identified virus, but  do not rule out bacterial infection or co-infection with other pathogens not detected by the test. Clinical correlation with patient history and other diagnostic information is necessary to determine patient infection status. The expected result is Negative.  Fact Sheet for Patients: EntrepreneurPulse.com.au  Fact Sheet for Healthcare Providers: IncredibleEmployment.be  This test is not yet approved or cleared by the [place] FDA and  has been authorized for detection and/or diagnosis of SARS-CoV-2 by FDA under an Emergency Use Authorization (EUA).  This EUA will remain in effect (meaning this test can be [place] ed) for the duration of  the COVID-19 declaration under Section 564(b)(1) of the Act, 21 U.S.C. section 360bbb-3(b)(1), unless the authorization is terminated or revoked sooner.     Influenza A by PCR NEGATIVE NEGATIVE Final   Influenza B by PCR NEGATIVE NEGATIVE Final    Comment: (NOTE) The Xpert Xpress SARS-CoV-2/FLU/RSV plus assay is intended as an aid in the diagnosis of influenza from Nasopharyngeal swab specimens and should not be used as a sole basis for treatment. Nasal washings and aspirates are unacceptable for Xpert Xpress SARS-CoV-2/FLU/RSV testing.  Fact Sheet for Patients: EntrepreneurPulse.com.au  Fact Sheet for Healthcare Providers: IncredibleEmployment.be  This test is not yet approved or cleared by the [place] FDA and has been authorized for detection and/or diagnosis of SARS-CoV-2 by FDA under an Emergency Use Authorization (EUA). This EUA will remain in effect (meaning this test can be used) for the duration of the COVID-19 declaration under Section 564(b)(1) of the Act, 21 U.S.C. section 360bbb-3(b)(1), unless the authorization is terminated or revoked.  Performed at [facility], [address] [place], [place] [address]   Blood culture (routine x 2)      Status: None (Preliminary result)   Collection Time: 05/05/21 10:18 PM   Specimen: BLOOD  Result Value Ref Range Status   Specimen Description BLOOD  RIGHT HAND  Final   Special Requests   Final    BOTTLES DRAWN AEROBIC AND ANAEROBIC Blood Culture adequate volume   Culture   Final    NO GROWTH 4 DAYS Performed at [facility], [address] [place], [place] [address]    Report Status PENDING  Incomplete  Blood culture (routine x 2)     Status: None (Preliminary result)   Collection Time: 05/05/21 11:17 PM   Specimen: BLOOD  Result Value Ref Range Status   Specimen Description BLOOD RIGHT ARM  Final   Special Requests   Final    BOTTLES DRAWN AEROBIC AND ANAEROBIC Blood Culture adequate volume   Culture   Final    NO GROWTH 4 DAYS Performed at [facility], [address] [place], [place] [address]    Report Status PENDING  Incomplete  MRSA Next Gen by PCR, Nasal     Status: None   Collection Time: 05/07/21  9:16 AM   Specimen: Nasal Mucosa; Nasal Swab  Result Value Ref Range Status   MRSA by PCR Next Gen NOT DETECTED NOT DETECTED Final    Comment: (NOTE) The GeneXpert MRSA Assay (FDA approved for NASAL specimens only), is one component of a comprehensive MRSA colonization surveillance program. It is not intended to diagnose MRSA infection nor to guide or monitor treatment for MRSA infections. Test performance is not FDA approved in patients less than 44 years old. Performed at [facility], [place], [place] [address]   Expectorated Sputum Assessment w Gram Stain, Rflx to Resp Cult     Status: None   Collection Time: 05/07/21  1:18 PM   Specimen: Sputum  Result Value Ref Range Status   Specimen Description SPUTUM  Final   Special Requests NONE  Final   Sputum evaluation   Final    Sputum specimen not acceptable for testing.  Please recollect.   RESULT CALLED TO, READ BACK BY AND VERIFIED WITH: Pat Patrick 05/07/21 1411  KLW Performed at [facility], [address] [place], [place] [address]    Report Status 05/07/2021 FINAL  Final  Respiratory (~20 pathogens) panel by PCR     Status: None   Collection Time: 05/07/21  4:59 PM   Specimen: Nasopharyngeal Swab; Respiratory  Result Value Ref Range Status   Adenovirus NOT DETECTED NOT DETECTED Final   Coronavirus 229E NOT DETECTED NOT DETECTED Final    Comment: (NOTE) The Coronavirus on the Respiratory Panel, DOES NOT test for the novel  Coronavirus (2019 nCoV)    Coronavirus HKU1 NOT DETECTED NOT DETECTED Final   Coronavirus NL63 NOT DETECTED NOT DETECTED Final   Coronavirus OC43 NOT DETECTED NOT DETECTED Final   Metapneumovirus NOT DETECTED NOT DETECTED Final   Rhinovirus / Enterovirus NOT DETECTED NOT DETECTED Final   Influenza A NOT DETECTED NOT DETECTED Final   Influenza B NOT DETECTED NOT DETECTED Final   Parainfluenza Virus 1 NOT DETECTED NOT DETECTED Final   Parainfluenza Virus 2 NOT DETECTED NOT DETECTED Final   Parainfluenza Virus 3 NOT DETECTED NOT DETECTED Final   Parainfluenza Virus 4 NOT DETECTED NOT DETECTED Final   Respiratory Syncytial Virus NOT DETECTED NOT DETECTED Final   Bordetella pertussis NOT DETECTED NOT DETECTED Final   Bordetella Parapertussis NOT DETECTED NOT DETECTED Final   Chlamydophila pneumoniae NOT DETECTED NOT DETECTED Final   Mycoplasma pneumoniae NOT DETECTED NOT DETECTED Final    Comment: Performed at [facility], [address], [place], [place] [address]  Expectorated Sputum Assessment w Gram Stain, Rflx to Resp Cult     Status: None   Collection Time: 05/07/21  7:19 PM   Specimen: Expectorated Sputum  Result Value Ref Range Status   Specimen Description EXPECTORATED SPUTUM  Final   Special Requests NONE  Final   Sputum evaluation   Final    THIS SPECIMEN IS ACCEPTABLE FOR SPUTUM CULTURE Performed at [facility], [address] [place], [place] [address]    Report Status  05/07/2021 FINAL  Final  Culture, Respiratory w Gram Stain     Status: None (Preliminary result)   Collection Time: 05/07/21  7:19 PM  Result Value Ref Range Status   Specimen Description   Final    EXPECTORATED SPUTUM Performed at [facility], [address] [place], [place] [address]    Special Requests   Final    NONE Reflexed from [id number] Performed at [facility], [address] [place], [place] [address]    Gram Stain   Final    NO WBC SEEN RARE GRAM POSITIVE COCCI IN PAIRS RARE GRAM POSITIVE RODS RARE BUDDING YEAST SEEN    Culture   Final    CULTURE REINCUBATED FOR BETTER GROWTH Performed at [facility], [place] [address], [place],  [address]    Report Status PENDING  Incomplete    IMAGING RESULTS:  I have personally reviewed the films ?Severe multilobar bilateral bronchopneumonia, presumably secondary to reported COVID infection  Impression/Recommendation ?86 yr female with complicated medical history presenting with generalized weakness following COVID infection  Has Sjogrens and cutaneous lupus and on methotrexate and recent 7 week prednisone taper Fever and sob with CT chest showing b/l multilobar bronchopneumonia and ground glass interstitial infiltrate COVID PCR positive -ct value 30  Is this long covid because of her immune compromised state? Could she have opportunistic infections because of methotrexate and steroids? Could she have methotrexate induced pneumonitis? Could she have adrenal insufficiency causing fever and weakness Agree with starting remdisivir and steroid If no response to above can do conval plasma Will do tests to check for any Opportunistic infection( PCP, fungal, CMV) Day 5 of ceftriaxone/azithromycin- no response will complete today  Bicytopenia with leucopenia and macrocytic anemia - r/o MTX induced bone marrow suppression. ?may consider  Bone marrow biopsy to r/o myelodysplasia as OP  H/O ca breast  s/p left mastectomy in 2017 was on letrozole for 5 years for ER/PR + malignancy  /o thyroid cancer- s/p thyroidectomy, radiation  Hypothyroidism on synthroid ? ?Discussed the management with patient, her daughter and hospitalist and pulmonologist ___________________________________________________ Discussed with patient, requesting provider Note:  This document was prepared using Dragon voice recognition software and may include unintentional dictation errors.

## 2021-05-09 NOTE — Progress Notes (Signed)
Physical Therapy Treatment Patient Details Name: Kim Santiago MRN: [id number] DOB: Jan 31, 1935 Today's Date: 05/09/2021    History of Present Illness 86 y.o. Caucasian female with medical history significant for hypertension, hypothyroidism, cutaneous lupus, coronary artery disease, Sjogren's syndrome and breast cancer status post left mastectomy, who presented to the ER with acute onset of significant generalized weakness.  Was diagnosed with COVID-19 about 4 weeks prior to admission.    PT Comments    Pt received seated at EOB agreeable to PT services.  Resting at 2L/min with SPO2 at 90-94% and HR 110-112 BPM. Pt educated and performed seated There.ex with good form/technique. HR elevated to low 120's with there.ex and desat to 88%. Supervision to STS to RW with minguard with VC's for hand placement. Pt amb x2, 20' bouts in room due to COVID + with seated rest b/t bouts. HR trending still mid 120's with amb and desatting SPO2 to 83-84%. Able to return to > 90% after seated and standing rest after ~30 sec with education and cuing on PLB. Pt returned to seated in recliner with additional 10' amb with same level of assist. Pt re-educated on seated therex pt can perform during her acute stay, on reps, sets, frequency. Pt and pt's daughter verbalized understanding. Pt remains deconditioned however requires little to non physical assist. Pt remains having adequate family support and current D/c recs to [facility] PT remain appropriate.    Follow Up Recommendations  Home health PT     Equipment Recommendations  None recommended by PT    Recommendations for Other Services       Precautions / Restrictions Precautions Precautions: Fall Restrictions Weight Bearing Restrictions: No    Mobility  Bed Mobility Overal bed mobility: Modified Independent             General bed mobility comments: Pt able to get herself to EOB with minimal extra time but no direct assist    Transfers Overall  transfer level: Needs assistance Equipment used: Rolling walker (2 wheeled) Transfers: Sit to/from Stand Sit to Stand: Min guard         General transfer comment: Only required VC's for safe hand placement.  Ambulation/Gait Ambulation/Gait assistance: Min guard Gait Distance (Feet): 50 Feet Assistive device: Rolling walker (2 wheeled)       General Gait Details: Had to walk in room due to covid +   Stairs             Wheelchair Mobility    Modified Rankin (Stroke Patients Only)       Balance Overall balance assessment: Needs assistance Sitting-balance support: No upper extremity supported Sitting balance-Leahy Scale: Good     Standing balance support: Bilateral upper extremity supported;During functional activity Standing balance-Leahy Scale: Fair Standing balance comment: Requires UE support on RW due to LE weakness.                            Cognition Arousal/Alertness: Awake/alert Behavior During Therapy: WFL for tasks assessed/performed Overall Cognitive Status: Within Functional Limits for tasks assessed                                        Exercises General Exercises - Lower Extremity Ankle Circles/Pumps: AROM;Seated;Both;20 reps;Strengthening Long Arc Quad: AROM;Seated;Both;20 reps;Strengthening Hip ABduction/ADduction: AROM;10 reps;Seated;Strengthening Hip Flexion/Marching: AROM;Seated;Both;20 reps Toe Raises: AROM;Strengthening;20 reps;Seated;Both Heel Raises: AROM;Strengthening;Seated;Both;20 reps  Other Exercises Other Exercises: Educated on pursed lip breathing    General Comments General comments (skin integrity, edema, etc.): Pt remains extremely motivated      Pertinent Vitals/Pain Pain Assessment: No/denies pain    Home Living                      Prior Function            PT Goals (current goals can now be found in the care plan section) Acute Rehab PT Goals Patient Stated Goal: Go  home PT Goal Formulation: With patient/family Time For Goal Achievement: 05/21/21 Potential to Achieve Goals: Good Progress towards PT goals: Progressing toward goals    Frequency    Min 2X/week      PT Plan Current plan remains appropriate    Co-evaluation              AM-PAC PT "6 Clicks" Mobility   Outcome Measure  Help needed turning from your back to your side while in a flat bed without using bedrails?: None Help needed moving from lying on your back to sitting on the side of a flat bed without using bedrails?: None Help needed moving to and from a bed to a chair (including a wheelchair)?: A Little Help needed standing up from a chair using your arms (e.g., wheelchair or bedside chair)?: A Little Help needed to walk in hospital room?: A Little Help needed climbing 3-5 steps with a railing? : A Little 6 Click Score: 20    End of Session Equipment Utilized During Treatment: Gait belt;Oxygen Activity Tolerance: Patient tolerated treatment well Patient left: in chair;with call bell/phone within reach;with family/visitor present Nurse Communication: Mobility status PT Visit Diagnosis: Muscle weakness (generalized) (M62.81);Difficulty in walking, not elsewhere classified (R26.2);Unsteadiness on feet (R26.81)     Time: [id number] PT Time Calculation (min) (ACUTE ONLY): 24 min  Charges:  $Gait Training: 8-22 mins $Therapeutic Exercise: 8-22 mins                     Marlene Beidler M. Fairly IV, PT, DPT Physical Therapist- [facility]  05/09/2021, 12:16 PM

## 2021-05-09 NOTE — Progress Notes (Signed)
Lab states patient's CT value on PCR covid test performed on 05/05/21 was 30.3, Dr Lanney Gins notified via secure chat.

## 2021-05-09 NOTE — Progress Notes (Signed)
Late note: see flowsheets and MAR from 05/08/21 morning. Patient stabilized after interventions.    05/08/21 0832  Assess: MEWS Score  Temp (!) 103.1 F (39.5 C)  BP (!) 132/56  Pulse Rate (!) 114  Resp 18  SpO2 99 %  O2 Device Nasal Cannula  O2 Flow Rate (L/min) 2 L/min  Assess: MEWS Score  MEWS Temp 2  MEWS Systolic 0  MEWS Pulse 2  MEWS RR 0  MEWS LOC 0  MEWS Score 4  MEWS Score Color Red  Assess: if the MEWS score is Yellow or Red  Were vital signs taken at a resting state? Yes  Focused Assessment Change from prior assessment (see assessment flowsheet)  Does the patient meet 2 or more of the SIRS criteria? Yes  Does the patient have a confirmed or suspected source of infection? Yes  Provider and Rapid Response Notified? Yes  MEWS guidelines implemented *See Row Information* Yes  Treat  MEWS Interventions Administered prn meds/treatments  Pain Scale 0-10  Take Vital Signs  Increase Vital Sign Frequency  Red: Q 1hr X 4 then Q 4hr X 4, if remains red, continue Q 4hrs  Escalate  MEWS: Escalate Yellow: discuss with charge nurse/RN and consider discussing with provider and RRT  Notify: Charge Nurse/RN  Name of Charge Nurse/RN Notified Leanora Cover  Date Charge Nurse/RN Notified 05/08/21  Time Charge Nurse/RN Notified [id number]  Notify: Provider  Provider Name/Title Dr. Loleta Books  Date Provider Notified 05/08/21  Time Provider Notified [phone]  Notification Type Face-to-face  Notification Reason Other (Comment) (red mews)  Provider response No new orders  Date of Provider Response 05/08/21  Time of Provider Response 0849  Notify: Rapid Response  Name of Rapid Response RN Notified Charlie Fleetwood  Date Rapid Response Notified 05/08/21  Time Rapid Response Notified 0832  Document  Patient Outcome Stabilized after interventions  Progress note created (see row info) Yes  Assess: SIRS CRITERIA  SIRS Temperature  1  SIRS Pulse 1  SIRS Respirations  0  SIRS WBC 0  SIRS Score  Sum  2

## 2021-05-10 ENCOUNTER — Inpatient Hospital Stay (HOSPITAL_COMMUNITY)
Admit: 2021-05-10 | Discharge: 2021-05-10 | Disposition: A | Discharge status: Home / Self Care | Payer: PPO | Attending: Pulmonary Disease | Admitting: Pulmonary Disease

## 2021-05-10 ENCOUNTER — Encounter: Payer: Self-pay | Admitting: Family Medicine

## 2021-05-10 ENCOUNTER — Inpatient Hospital Stay: Payer: PPO

## 2021-05-10 DIAGNOSIS — U071 COVID-19: Secondary | ICD-10-CM | POA: Diagnosis not present

## 2021-05-10 DIAGNOSIS — I5021 Acute systolic (congestive) heart failure: Secondary | ICD-10-CM

## 2021-05-10 DIAGNOSIS — R531 Weakness: Secondary | ICD-10-CM | POA: Diagnosis not present

## 2021-05-10 DIAGNOSIS — J1282 Pneumonia due to coronavirus disease 2019: Secondary | ICD-10-CM | POA: Diagnosis not present

## 2021-05-10 LAB — CBC
HCT: 23.8 % — ABNORMAL LOW (ref 36.0–46.0)
Hemoglobin: 8.6 g/dL — ABNORMAL LOW (ref 12.0–15.0)
MCH: 36.6 pg — ABNORMAL HIGH (ref 26.0–34.0)
MCHC: 36.1 g/dL — ABNORMAL HIGH (ref 30.0–36.0)
MCV: 101.3 fL — ABNORMAL HIGH (ref 80.0–100.0)
Platelets: 140 10*3/uL — ABNORMAL LOW (ref 150–400)
RBC: 2.35 MIL/uL — ABNORMAL LOW (ref 3.87–5.11)
RDW: 18.9 % — ABNORMAL HIGH (ref 11.5–15.5)
WBC: 3.2 10*3/uL — ABNORMAL LOW (ref 4.0–10.5)
nRBC: 0.6 % — ABNORMAL HIGH (ref 0.0–0.2)

## 2021-05-10 LAB — ECHOCARDIOGRAM COMPLETE
AR max vel: 2.11 cm2
AV Peak grad: 6.8 mmHg
Ao pk vel: 1.3 m/s
Area-P 1/2: 5.16 cm2
Height: 63 in
S' Lateral: 2.5 cm
Weight: 2292.78 oz

## 2021-05-10 LAB — CBC WITH DIFFERENTIAL/PLATELET
Abs Immature Granulocytes: 0.07 10*3/uL (ref 0.00–0.07)
Basophils Absolute: 0 10*3/uL (ref 0.0–0.1)
Basophils Relative: 1 %
Eosinophils Absolute: 0 10*3/uL (ref 0.0–0.5)
Eosinophils Relative: 0 %
HCT: 26.5 % — ABNORMAL LOW (ref 36.0–46.0)
Hemoglobin: 9.4 g/dL — ABNORMAL LOW (ref 12.0–15.0)
Immature Granulocytes: 2 %
Lymphocytes Relative: 11 %
Lymphs Abs: 0.3 10*3/uL — ABNORMAL LOW (ref 0.7–4.0)
MCH: 35.2 pg — ABNORMAL HIGH (ref 26.0–34.0)
MCHC: 35.5 g/dL (ref 30.0–36.0)
MCV: 99.3 fL (ref 80.0–100.0)
Monocytes Absolute: 0.2 10*3/uL (ref 0.1–1.0)
Monocytes Relative: 6 %
Neutro Abs: 2.4 10*3/uL (ref 1.7–7.7)
Neutrophils Relative %: 80 %
Platelets: 140 10*3/uL — ABNORMAL LOW (ref 150–400)
RBC: 2.67 MIL/uL — ABNORMAL LOW (ref 3.87–5.11)
RDW: 18.6 % — ABNORMAL HIGH (ref 11.5–15.5)
Smear Review: NORMAL
WBC: 3.1 10*3/uL — ABNORMAL LOW (ref 4.0–10.5)
nRBC: 0 % (ref 0.0–0.2)

## 2021-05-10 LAB — TYPE AND SCREEN
ABO/RH(D): O POS
Antibody Screen: NEGATIVE
Unit division: 0

## 2021-05-10 LAB — BPAM FFP
Blood Product Expiration Date: 202209031412
ISSUE DATE / TIME: 202209021525
Unit Type and Rh: 5100

## 2021-05-10 LAB — BASIC METABOLIC PANEL
Anion gap: 6 (ref 5–15)
BUN: 12 mg/dL (ref 8–23)
CO2: 25 mmol/L (ref 22–32)
Calcium: 8 mg/dL — ABNORMAL LOW (ref 8.9–10.3)
Chloride: 103 mmol/L (ref 98–111)
Creatinine, Ser: 0.65 mg/dL (ref 0.44–1.00)
GFR, Estimated: >60 mL/min (ref >60)
Glucose, Bld: 133 mg/dL — ABNORMAL HIGH (ref 70–99)
Potassium: 4.1 mmol/L (ref 3.5–5.1)
Sodium: 134 mmol/L — ABNORMAL LOW (ref 135–145)

## 2021-05-10 LAB — BPAM RBC
Blood Product Expiration Date: 202210052359
ISSUE DATE / TIME: 202209021550
Unit Type and Rh: 5100

## 2021-05-10 LAB — CULTURE, RESPIRATORY W GRAM STAIN: Gram Stain: NONE SEEN

## 2021-05-10 LAB — CULTURE, BLOOD (ROUTINE X 2)
Culture: NO GROWTH
Culture: NO GROWTH
Special Requests: ADEQUATE
Special Requests: ADEQUATE

## 2021-05-10 LAB — CRYPTOCOCCAL ANTIGEN: Crypto Ag: NEGATIVE

## 2021-05-10 LAB — PREPARE FRESH FROZEN PLASMA

## 2021-05-10 LAB — CORTISOL-AM, BLOOD: Cortisol - AM: 11.6 ug/dL (ref 6.7–22.6)

## 2021-05-10 MED ORDER — FUROSEMIDE 10 MG/ML IJ SOLN
20.0000 mg | Freq: Two times a day (BID) | INTRAMUSCULAR | Status: DC
  Administered 2021-05-10 – 2021-05-12 (×4): 20 mg via INTRAVENOUS
  Filled 2021-05-10 (×4): qty 2

## 2021-05-10 NOTE — Progress Notes (Signed)
*  PRELIMINARY RESULTS* Echocardiogram 2D Echocardiogram has been performed.  Kim Santiago 05/10/2021, 2:33 PM

## 2021-05-10 NOTE — TOC Initial Note (Signed)
Transition of Care [facility]) - Initial/Assessment Note    Patient Details  Name: Kim Santiago MRN: [id number] Date of Birth: June 02, 1935  Transition of Care [facility]) CM/SW Contact:    Kim Masson, RN Phone Number:[phone] 05/10/2021, 10:12 AM  Clinical Narrative:                 Pt will need HHPT upon her discharge. Spoke with daughter via bedside Kim Santiago) who was receptive to the recommendations of HHPT. Pt has a history with [facility]. Spoke with Kim Santiago for HHPT at the pt's [place] address and [employer]) for a rollator with seat.   Pt lives with her daughter and other family members in the home with a good support system. Pt has a walk-in shower, no steps in the home, received transportation services from her family and uses [facility] for all medications. Pt has a 3-1 commode and walker but request a rollator for ambulating securely throughout the home.   Pt not medical stable for discharge today and TOC team will continue to follow for any other request prior to discharge.   Expected Discharge Plan: [place] Barriers to Discharge: Continued Medical Work up   Patient Goals and CMS Choice        Expected Discharge Plan and Services Expected Discharge Plan: [place]   Discharge Planning Services: CM Consult   Living arrangements for the past 2 months: Single Family Home                 DME Arranged: Walker rolling with seat DME Agency: [employer] Date DME Agency Contacted: 05/10/21 Time DME Agency Contacted: 1010 Representative spoke with at DME Agency: Kim Santiago: PT [place]: [place] ([place]) Date [place]: 05/10/21 Time [place]: 1010 Representative spoke with at [place]: Kim Santiago  Prior Living Arrangements/Services Living arrangements for the past 2 months: [place] with:: Adult Children Patient language and need for interpreter reviewed::  Yes Do you feel safe going back to the place where you live?: Yes      Need for Family Participation in Patient Care: Yes (Comment) Care giver support system in place?: Yes (comment) Current home services: DME Criminal Activity/Legal Involvement Pertinent to Current Situation/Hospitalization: No - Comment as needed  Activities of Daily Living Home Assistive Devices/Equipment: Gilford Rile (specify type) ADL Screening (condition at time of admission) Patient's cognitive ability adequate to safely complete daily activities?: Yes Is the patient deaf or have difficulty hearing?: Yes Does the patient have difficulty seeing, even when wearing glasses/contacts?: No Does the patient have difficulty concentrating, remembering, or making decisions?: No Patient able to express need for assistance with ADLs?: Yes Does the patient have difficulty dressing or bathing?: No Independently performs ADLs?: No Communication: Independent Dressing (OT): Independent Grooming: Independent Feeding: Independent Bathing: Needs assistance Is this a change from baseline?: Change from baseline, expected to last <3 days Toileting: Independent In/Out Bed: Needs assistance Is this a change from baseline?: Change from baseline, expected to last <3 days Walks in Home: Independent Does the patient have difficulty walking or climbing stairs?: Yes Weakness of Legs: None Weakness of Arms/Hands: None  Permission Sought/Granted   Permission granted to share information with : Yes, Verbal Permission Granted              Emotional Assessment Appearance:: Appears stated age Attitude/Demeanor/Rapport: Engaged Affect (typically observed): Accepting Orientation: : Oriented to Self, Oriented to Place, Oriented  to  Time, Oriented to Situation Alcohol / Substance Use: Not Applicable Psych Involvement: No (comment)  Admission diagnosis:  CAP (community acquired pneumonia) [J18.9] FTT (failure to thrive) in adult  [R62.7] Generalized weakness [R53.1] Community acquired pneumonia of right middle lobe of lung [J18.9] 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[place]) 01/16/2020   Other drug-induced neutropenia ([place]) 01/16/2020   Hemiplegia affecting dominant side, post-stroke ([place]) 01/16/2020   Does use hearing aid 07/14/2019   Hypothyroidism due to acquired atrophy of thyroid 04/08/2019   Impaired balance as late effect of cerebrovascular accident 11/13/2018   History of CVA with residual deficit 11/13/2018   Leukopenia 11/11/2018   Anxiety about health 07/05/2018   Mediastinal adenopathy 04/27/2018   Edema due to hypoalbuminemia 02/20/2018   Hospital discharge follow-up 02/20/2018   History of vertebral fracture 02/18/2018   GERD (gastroesophageal reflux disease) 10/31/2017   Hand joint pain 06/13/2017   Vaginal atrophy 05/11/2017   Venous insufficiency of both lower extremities 01/10/2017   Discoid lupus 06/26/2016   Anemia 05/24/2016   Recurrent UTI 04/29/2016   TMJ syndrome 03/31/2016   Hearing loss 03/12/2016   Osteopenia 12/05/2015   Malignant neoplasm of left female breast ([place]) 11/26/2015   Hx of thyroid cancer 06/12/2014   Adenomatous polyp 06/12/2014   Major depressive disorder, recurrent episode, moderate ([place]) 05/29/2014   Grief 04/08/2014   Sjogren's syndrome ([place]) 03/09/2014   Insomnia due to stress 03/06/2014   Hyperlipidemia 12/28/2012   Medicare annual wellness visit, subsequent 12/28/2012   Essential hypertension 10/02/2011   Iatrogenic hypothyroidism 10/02/2011   Purpura ([place]) 05/25/2011   PCP:  [name], MD Pharmacy[facility]   [id number] [place] [id number]:  [phone]: [phone]    Social Determinants of Health (SDOH) Interventions    Readmission Risk Interventions No flowsheet data found.

## 2021-05-10 NOTE — Progress Notes (Signed)
Pulmonary Medicine          Date: 05/10/2021,   MRN# [id number] Kim Santiago 11-07-1934     AdmissionWeight: 65 kg                 CurrentWeight: 65 kg   Referring physician: Dr. Loleta Books   CHIEF COMPLAINT:   Refractory pneumonia with with complex comorbid history   HISTORY OF PRESENT ILLNESS   This is a pleasant 86 year old female she has a history of osteoarthritis, breast cancer with left mastectomy in 2017 as well as lymph node resection, chronic kidney disease, history of MI, essential hypertension, hypothyroidism post thyroidectomy due to thyroid cancer, has a history of lupus with Sjogren's overlap, she is postmenopausal, she does have osteoarthritis, systolic heart failure with ejection fraction of 35%, who initially came into the emergency room with complaints of malaise post COVID-19 4 weeks ago.  She does have residual cough and states that intermittent expectoration of phlegm with yellow discoloration is ongoing with possible progression.  She denied GI symptoms specifically nausea vomiting and diarrhea was absent, she denied chest pain falls, confusion, seizure activity, visual impairment.  She did have immunization with COVID-19 with a total of 3 doses.  She was tachycardic on arrival with hyponatremia at 129 mild lactic acidosis chronic anemia with a hemoglobin of 9 and had another COVID-19 test when she came in which was positive still from previous.  For her systemic inflammatory autoimmune disease she does take methotrexate 15 mg once weekly.  Respiratory cultures were done which are negative at this time, also patient had non-COVID respiratory viral panel with 20 organisms reviewed all negative.  There is serum Fungitell in process. Patient remains febrile. Procalcitonin was also done which was essentially normal.  PCCM consultation placed for additional evaluation management.   05/10/21- Patient evaluated at bedside. She had TTE which is essentially normal, BNP  also wnl so unlikely cardiac dysfunction playing a role here. We discussed gentle diuresis today and continued bronchopulmonary hyginene and PT as previous.  Will dc IVF today as patient does have mild pedal edema now and pulmonary congestion. Repeat CXR. Pathology PBF due to relative tricytopenia. Kim Santiago her daugher was present and shares patient has been weaker post CVA 2019. She would benefit from PT  PAST MEDICAL HISTORY   Past Medical History:  Diagnosis Date   Arthritis    Breast cancer ([place]) 2017   left mastectomy done 11/2015   Breast cancer in female [facility]) 11/18/2015   Left: 3.9 cm tumor, T2, 1/2 sentinel nodes positive for macro metastatic disease, N1, 3 negative nodes in the axillary tail, ER+,PR+, Her 2 neu, low Mammoprint score   Cancer (HCC)    thyroid takes levothyroxine   Chronic kidney disease    UTI   Genetic screening March 2017.   Mammoprint of left breast cancer: Low risk for recurrence.    History of heart attack 06/12/2014   Hypertension    hypothyroidism    secondary to thyroidectomy for thyroid ca   Hypothyroidism    Lupus ([place])    subcutaneous   Menopause 40s   natural, hot flashes and mood lability now gone, off prempro 7 months   Myocardial infarction [facility]) 2013   Osteoporosis    Osteopenia   Rosacea    Sjoegren syndrome    Stress-induced cardiomyopathy September of 2013   EF 35%. Peak troponin was 1.8.     SURGICAL HISTORY   Past Surgical History:  Procedure  Laterality Date   BACK SURGERY     BREAST BIOPSY Left 10/30/15   positive, done in Dr. Dwyane Luo office   CARDIAC CATHETERIZATION  05/2012   [place]. No significant CAD. Ejection fraction of 35% due to stress-induced cardiomyopathy.   CHOLECYSTECTOMY     COLONOSCOPY     DILATION AND CURETTAGE OF UTERUS     KYPHOSIS SURGERY  Feb 2008   L1, Dr. Mauri Pole   LUMBAR DISC SURGERY     L4-L5   MASTECTOMY Left 11/18/2015   positive   SENTINEL NODE BIOPSY Left 11/18/2015   Procedure: SENTINEL NODE  BIOPSY;  Surgeon: Robert Bellow, MD;  Location: [facility] ORS;  Service: General;  Laterality: Left;   SHOULDER ARTHROSCOPY  2004   Left, Dr. Jefm Bryant   SIMPLE MASTECTOMY WITH AXILLARY SENTINEL NODE BIOPSY Left 11/18/2015   Procedure: SIMPLE MASTECTOMY;  Surgeon: Robert Bellow, MD;  Location: [facility] ORS;  Service: General;  Laterality: Left;   SPINE SURGERY     L4-5 diskectomy   THYROIDECTOMY     Thyroid Cancer   TONSILLECTOMY     TUBAL LIGATION       FAMILY HISTORY   Family History  Problem Relation Age of Onset   Kidney disease Mother    Heart disease Mother    COPD Father    Cancer Father        esophageal   Kidney disease Sister    Cancer Brother 37       colon cancer (both brothers)   Heart attack Brother 72   Heart disease Brother    Breast cancer Neg Hx      SOCIAL HISTORY   Social History   Tobacco Use   Smoking status: Never   Smokeless tobacco: Never  Vaping Use   Vaping Use: Never used  Substance Use Topics   Alcohol use: No   Drug use: No     MEDICATIONS    Home Medication:    Current Medication:  Current Facility-Administered Medications:    0.9 %  sodium chloride infusion, , Intravenous, Continuous, Danford, Suann Larry, MD, Last Rate: 100 mL/hr at 05/08/21 0642, New Bag at 05/08/21 [id number]   acetaminophen (TYLENOL) tablet 650 mg, 650 mg, Oral, Q6H PRN, 650 mg at 05/09/21 1546 **OR** acetaminophen (TYLENOL) suppository 650 mg, 650 mg, Rectal, Q6H PRN, Mansy, Jan A, MD   acidophilus (RISAQUAD) capsule 1 capsule, 1 capsule, Oral, Daily, Mansy, Jan A, MD, 1 capsule at 05/10/21 1023   atorvastatin (LIPITOR) tablet 20 mg, 20 mg, Oral, Daily, Mansy, Jan A, MD, 20 mg at 05/10/21 1024   calcium-vitamin D (OSCAL WITH D) 500-200 MG-UNIT per tablet 2 tablet, 2 tablet, Oral, Daily, Mansy, Jan A, MD, 2 tablet at 05/10/21 1023   cefTRIAXone (ROCEPHIN) 2 g in sodium chloride 0.9 % 100 mL IVPB, 2 g, Intravenous, Q24H, Mansy, Jan A, MD, Stopped at 05/10/21  1224   cholecalciferol (VITAMIN D3) tablet 2,000 Units, 2,000 Units, Oral, Daily, Mansy, Jan A, MD, 2,000 Units at 05/10/21 1025   enoxaparin (LOVENOX) injection 40 mg, 40 mg, Subcutaneous, Q24H, Mansy, Jan A, MD, 40 mg at [id number]   folic acid (FOLVITE) tablet 1 mg, 1 mg, Oral, Daily, Mansy, Jan A, MD, 1 mg at 05/10/21 1023   guaiFENesin (MUCINEX) 12 hr tablet 600 mg, 600 mg, Oral, BID, Mansy, Jan A, MD, 600 mg at 05/10/21 1024   ibuprofen (ADVIL) tablet 400 mg, 400 mg, Oral, Q6H PRN, Danford, Suann Larry, MD, 400 mg  at 05/09/21 1815   levothyroxine (SYNTHROID) tablet 75 mcg, 75 mcg, Oral, Q0600, Mansy, Jan A, MD, 75 mcg at 05/10/21 0540   losartan (COZAAR) tablet 25 mg, 25 mg, Oral, Daily, Mansy, Jan A, MD, 25 mg at 05/10/21 1024   magnesium hydroxide (MILK OF MAGNESIA) suspension 30 mL, 30 mL, Oral, Daily PRN, Mansy, Jan A, MD   methylPREDNISolone sodium succinate (SOLU-MEDROL) 40 mg/mL injection 40 mg, 40 mg, Intravenous, Daily, Danford, Suann Larry, MD, 40 mg at 05/10/21 1025   metoprolol succinate (TOPROL-XL) 24 hr tablet 25 mg, 25 mg, Oral, Daily, Mansy, Jan A, MD, 25 mg at 05/10/21 1024   nitroGLYCERIN (NITROSTAT) SL tablet 0.4 mg, 0.4 mg, Sublingual, Q5 min PRN, Mansy, Jan A, MD   ondansetron (ZOFRAN) tablet 4 mg, 4 mg, Oral, Q6H PRN **OR** ondansetron (ZOFRAN) injection 4 mg, 4 mg, Intravenous, Q6H PRN, Mansy, Jan A, MD, 4 mg at 05/08/21 [id number]   pantoprazole (PROTONIX) EC tablet 40 mg, 40 mg, Oral, Daily, Mansy, Jan A, MD, 40 mg at 05/10/21 1024   polyethylene glycol (MIRALAX / GLYCOLAX) packet 17 g, 17 g, Oral, Daily, Mansy, Jan A, MD, 17 g at 05/09/21 1003   remdesivir 100 mg in sodium chloride 0.9 % 100 mL IVPB, 100 mg, Intravenous, Daily, Danford, Suann Larry, MD, Last Rate: 200 mL/hr at 05/10/21 1224, 100 mg at 05/10/21 1224   sertraline (ZOLOFT) tablet 25 mg, 25 mg, Oral, Daily, Mansy, Jan A, MD, 25 mg at 05/10/21 1024   tiZANidine (ZANAFLEX) tablet 2 mg, 2 mg, Oral, TID  PRN, Ezekiel Slocumb, DO   traZODone (DESYREL) tablet 25 mg, 25 mg, Oral, QHS PRN, Mansy, Jan A, MD   traZODone (DESYREL) tablet 50 mg, 50 mg, Oral, QHS, Mansy, Jan A, MD, 50 mg at 05/09/21 2138    ALLERGIES   Azathioprine, Hydroxychloroquine, Mycophenolate mofetil, Amoxicillin, Codeine, Naprosyn [naproxen], Orudis [ketoprofen], Sulfa antibiotics, and Sulfathiazole     REVIEW OF SYSTEMS    Review of Systems:  Gen:  Denies  fever, sweats, chills weigh loss  HEENT: Denies blurred vision, double vision, ear pain, eye pain, hearing loss, nose bleeds, sore throat Cardiac:  No dizziness, chest pain or heaviness, chest tightness,edema Resp:   Denies cough or sputum porduction, shortness of breath,wheezing, hemoptysis,  Gi: Denies swallowing difficulty, stomach pain, nausea or vomiting, diarrhea, constipation, bowel incontinence Gu:  Denies bladder incontinence, burning urine Ext:   Denies Joint pain, stiffness or swelling Skin: Denies  skin rash, easy bruising or bleeding or hives Endoc:  Denies polyuria, polydipsia , polyphagia or weight change Psych:   Denies depression, insomnia or hallucinations   Other:  All other systems negative   VS: BP (!) 172/78 (BP Location: Right Leg)   Pulse 97   Temp 98.4 F (36.9 C) (Oral)   Resp 18   Ht '5\' 3"'$  (1.6 m)   Wt 65 kg   SpO2 97%   BMI 25.38 kg/m      PHYSICAL EXAM    GENERAL:NAD, no fevers, chills, no weakness no fatigue HEAD: Normocephalic, atraumatic.  EYES: Pupils equal, round, reactive to light. Extraocular muscles intact. No scleral icterus.  MOUTH: Moist mucosal membrane. Dentition intact. No abscess noted.  EAR, NOSE, THROAT: Clear without exudates. No external lesions.  NECK: Supple. No thyromegaly. No nodules. No JVD.  PULMONARY: Crackles and mild rhonchorous sounds worse at left lower base CARDIOVASCULAR: S1 and S2. Regular rate and rhythm. No murmurs, rubs, or gallops. No edema. Pedal pulses 2+ bilaterally.  GASTROINTESTINAL: Soft, nontender, nondistended. No masses. Positive bowel sounds. No hepatosplenomegaly.  MUSCULOSKELETAL: No swelling, clubbing, or edema. Range of motion full in all extremities.  NEUROLOGIC: Cranial nerves II through XII are intact. No gross focal neurological deficits. Sensation intact. Reflexes intact.  SKIN: No ulceration, lesions, rashes, or cyanosis. Skin warm and dry. Turgor intact.  PSYCHIATRIC: Mood, affect within normal limits. The patient is awake, alert and oriented x 3. Insight, judgment intact.       IMAGING    CT CHEST W CONTRAST  Result Date: 05/08/2021 CLINICAL DATA:  86 year old female with history of pneumonia noted on recent chest x-ray. COVID positive patient. Evaluate for pneumonia versus interstitial lung disease. History of Sjogren's disease and cutaneous lupus. EXAM: CT CHEST WITH CONTRAST TECHNIQUE: Multidetector CT imaging of the chest was performed during intravenous contrast administration. CONTRAST:  70m OMNIPAQUE IOHEXOL 350 MG/ML SOLN COMPARISON:  Chest CT 05/05/2021. FINDINGS: Cardiovascular: Heart size is normal. There is no significant pericardial fluid, thickening or pericardial calcification. There is aortic atherosclerosis, as well as atherosclerosis of the great vessels of the mediastinum and the coronary arteries, including calcified atherosclerotic plaque in the left anterior descending and right coronary arteries. Calcifications of the mitral annulus. Mediastinum/Nodes: Multiple prominent borderline enlarged mediastinal lymph nodes are noted, nonspecific and likely reactive. No definite pathologically enlarged mediastinal or hilar lymph nodes. Small hiatal hernia. No axillary lymphadenopathy. Lungs/Pleura: Widespread but patchy areas of ground-glass attenuation, interlobular septal thickening and thickening of the peribronchovascular interstitium are noted throughout all aspects of the lungs bilaterally, indicative of severe multilobar  bilateral bronchopneumonia, presumably secondary to reported COVID infection. Trace bilateral pleural effusions layer dependently. Upper Abdomen: Aortic atherosclerosis.  Status post cholecystectomy. Musculoskeletal: Status post left modified radical mastectomy. Chronic compression fracture of L1 with 25% loss of anterior vertebral body height and post vertebroplasty changes. There are no aggressive appearing lytic or blastic lesions noted in the visualized portions of the skeleton. IMPRESSION: 1. Severe multilobar bilateral bronchopneumonia, presumably secondary to reported COVID infection. 2. Trace bilateral pleural effusions. 3. Small hiatal hernia. 4. Aortic atherosclerosis, in addition to 2 vessel coronary artery disease. Aortic Atherosclerosis (ICD10-I70.0). Electronically Signed   By: DVinnie LangtonM.D.   On: 05/08/2021 16:15   DG Chest Portable 1 View  Result Date: 05/05/2021 CLINICAL DATA:  Weakness, COVID positive EXAM: PORTABLE CHEST 1 VIEW COMPARISON:  02/09/2018 FINDINGS: Mild patchy opacities in the right upper lobe and right mid lung. Mild left basilar opacity, possibly atelectasis. No pleural effusion or pneumothorax. Partial eventration of the right hemidiaphragm. The heart is normal in size. Suspected healed right lateral clavicle fracture. IMPRESSION: Mild patchy opacities in the right lung, compatible with pneumonia in this patient with known COVID. Electronically Signed   By: SJulian HyM.D.   On: 05/05/2021 21:22   ECHOCARDIOGRAM COMPLETE  Result Date: 05/10/2021    ECHOCARDIOGRAM REPORT   Patient Name:   Kim ZEILINGERDate of Exam: 05/10/2021 Medical Rec #:  [id number]       Height:       63.0 in Accession #:    [id number]      Weight:       143.3 lb Date of Birth:  [id number]       BSA:          1.678 m Patient Age:    89years         BP:           169/79 mmHg Patient  Gender: F                HR:           99 bpm. Exam Location:  [facility] Procedure: 2D Echo and Strain  Analysis Indications:     CHF Acute Systolic [id number]  History:         Patient has prior history of Echocardiogram examinations, most                  recent 11/08/2018.  Sonographer:     Kathlen Brunswick RDCS Referring Phys:  [id number] Ottie Glazier Diagnosing Phys: Kate Sable MD  Sonographer Comments: Global longitudinal strain was attempted. IMPRESSIONS  1. Left ventricular ejection fraction, by estimation, is 55 to 60%. The left ventricle has normal function. The left ventricle has no regional wall motion abnormalities. Left ventricular diastolic parameters were normal.  2. Right ventricular systolic function is normal. The right ventricular size is normal.  3. The mitral valve is normal in structure. No evidence of mitral valve regurgitation.  4. The aortic valve is tricuspid. Aortic valve regurgitation is not visualized.  5. The inferior vena cava is normal in size with greater than 50% respiratory variability, suggesting right atrial pressure of 3 mmHg. FINDINGS  Left Ventricle: Left ventricular ejection fraction, by estimation, is 55 to 60%. The left ventricle has normal function. The left ventricle has no regional wall motion abnormalities. Global longitudinal strain performed but not reported based on interpreter judgement due to suboptimal tracking. The left ventricular internal cavity size was normal in size. There is no left ventricular hypertrophy. Left ventricular diastolic parameters were normal. Right Ventricle: The right ventricular size is normal. No increase in right ventricular wall thickness. Right ventricular systolic function is normal. Left Atrium: Left atrial size was normal in size. Right Atrium: Right atrial size was normal in size. Pericardium: There is no evidence of pericardial effusion. Mitral Valve: The mitral valve is normal in structure. No evidence of mitral valve regurgitation. Tricuspid Valve: The tricuspid valve is normal in structure. Tricuspid valve regurgitation is mild.  Aortic Valve: The aortic valve is tricuspid. Aortic valve regurgitation is not visualized. Aortic valve peak gradient measures 6.8 mmHg. Pulmonic Valve: The pulmonic valve was normal in structure. Pulmonic valve regurgitation is trivial. Aorta: The aortic root and ascending aorta are structurally normal, with no evidence of dilitation. Venous: The inferior vena cava is normal in size with greater than 50% respiratory variability, suggesting right atrial pressure of 3 mmHg. IAS/Shunts: No atrial level shunt detected by color flow Doppler.  LEFT VENTRICLE PLAX 2D LVIDd:         3.50 cm  Diastology LVIDs:         2.50 cm  LV e' medial:    14.10 cm/s LV PW:         1.00 cm  LV E/e' medial:  7.6 LV IVS:        1.00 cm  LV e' lateral:   15.30 cm/s LVOT diam:     1.90 cm  LV E/e' lateral: 7.0 LV SV:         53 LV SV Index:   32 LVOT Area:     2.84 cm  RIGHT VENTRICLE RV Basal diam:  2.90 cm RV S prime:     15.10 cm/s TAPSE (M-mode): 2.1 cm LEFT ATRIUM             Index       RIGHT ATRIUM  Index LA diam:        3.00 cm 1.79 cm/m  RA Area:     9.68 cm LA Vol (A2C):   22.6 ml 13.47 ml/m RA Volume:   20.40 ml 12.16 ml/m LA Vol (A4C):   20.6 ml 12.28 ml/m LA Biplane Vol: 22.5 ml 13.41 ml/m  AORTIC VALVE                PULMONIC VALVE AV Area (Vmax): 2.11 cm    PV Vmax:       0.95 m/s AV Vmax:        130.00 cm/s PV Peak grad:  3.6 mmHg AV Peak Grad:   6.8 mmHg LVOT Vmax:      96.60 cm/s LVOT Vmean:     59.600 cm/s LVOT VTI:       0.187 m  AORTA Ao Root diam: 2.90 cm Ao Asc diam:  3.20 cm MITRAL VALVE                TRICUSPID VALVE MV Area (PHT): 5.16 cm     TV Peak grad:   27.1 mmHg MV Decel Time: 147 msec     TV Vmax:        2.61 m/s MV E velocity: 107.00 cm/s MV A velocity: 33.40 cm/s   SHUNTS MV E/A ratio:  3.20         Systemic VTI:  0.19 m                             Systemic Diam: 1.90 cm Kate Sable MD Electronically signed by Kate Sable MD Signature Date/Time: 05/10/2021/3:32:19 PM    Final        ASSESSMENT/PLAN   Acute hypoxemic respiratory failure -Patient had COVID-19 4 weeks ago.  Due to relative immunocompromise state with methotrexate she is at high risk for long COVID syndrome, additionally cycle threshold value is 30.3 indicative of active replication of virus and ongoing infection.  Recommendation for repeated treatment and consideration of high titer convalescent plasma available for these cases -Agree with Fungitell testing as patient is at increased risk for fungal infection -Review of CT chest above does show bilateral pulmonary congestion with edema and atelectasis worse at the left lower lobe.  Patient has history of CHF with previous echo showing EF of 35 as well as another echo showing EF of 65 with diastolic dysfunction.  We will repeat transthoracic echo as well as obtain BNP today.  Patient may benefit from diuresis if possible. -Discussed with ID and placed consultation for High titer convalescent plasma -continue current therapy with solumedrol and Remdesevir.  PT/OT   Bibasilar atelectasis -upgrade recruitment to metaNEB with albuterol BID with RT -may not be able to do due to COVID precautions but can attempt VEST -continue IS and flutter      Chronic systolic and diastolic CHF-RESOLVED  S/P TTE 05/09/21- NORMAL   - patient with 1+ pedal edema   -will repeat TTE and BNP today  -    Thank you for allowing me to participate in the care of this patient.    Patient/Family are satisfied with care plan and all questions have been answered.  This document was prepared using Dragon voice recognition software and may include unintentional dictation errors.     Ottie Glazier, M.D.  Division of [place]

## 2021-05-10 NOTE — Progress Notes (Signed)
Date of Admission:  05/05/2021    ID: Kim Santiago is a 86 y.o. female  Active Problems:   CAP (community acquired pneumonia)    Subjective: Doing much better Playing scrabbles with daughter Has some sweating Breathing better No fever C/o dry mouth   Medications:   acidophilus  1 capsule Oral Daily   atorvastatin  20 mg Oral Daily   calcium-vitamin D  2 tablet Oral Daily   cholecalciferol  2,000 Units Oral Daily   enoxaparin (LOVENOX) injection  40 mg Subcutaneous [id number]   folic acid  1 mg Oral Daily   furosemide  20 mg Intravenous BID   guaiFENesin  600 mg Oral BID   levothyroxine  75 mcg Oral Q0600   losartan  25 mg Oral Daily   methylPREDNISolone (SOLU-MEDROL) injection  40 mg Intravenous Daily   metoprolol succinate  25 mg Oral Daily   pantoprazole  40 mg Oral Daily   polyethylene glycol  17 g Oral Daily   sertraline  25 mg Oral Daily   traZODone  50 mg Oral QHS  Patient Vitals for the past 24 hrs:  BP Temp Temp src Pulse Resp SpO2  05/10/21 1600 (!) 151/78 98.5 F (36.9 C) -- 100 18 98 %  05/10/21 1321 (!) 172/78 98.4 F (36.9 C) Oral 97 18 97 %  05/10/21 0903 (!) 169/79 97.8 F (36.6 C) -- (!) 105 18 (!) 87 %  05/10/21 0605 (!) 169/89 97.9 F (36.6 C) -- 100 18 97 %  05/09/21 2228 (!) 132/49 98 F (36.7 C) -- 85 18 97 %  05/09/21 2040 (!) 134/50 98.2 F (36.8 C) Oral 100 18 97 %     Objective: Vital signs in last 24 hours: Temp:  [97.8 F (36.6 C)-98.5 F (36.9 C)] 98.5 F (36.9 C) (09/03 1600) Pulse Rate:  [85-105] 100 (09/03 1600) Resp:  [18] 18 (09/03 1600) BP: (132-172)/(49-89) 151/78 (09/03 1600) SpO2:  [87 %-98 %] 98 % (09/03 1600)  PHYSICAL EXAM:  General: Alert, cooperative, no distress,  ENT Nares normal. No drainage or sinus tenderness. Lips, mucosa, and tongue normal. No Thrush Neck: Supple, symmetrical, no adenopathy, thyroid: non tender no carotid bruit and no JVD. Back: No CVA tenderness. Lungs: b./l air entry- crepts  bases Heart: Regular rate and rhythm, no murmur, rub or gallop. Abdomen: Soft, non-tender,not distended. Bowel sounds normal. No masses Extremities: atraumatic, no cyanosis. No edema. No clubbing Skin: extensive brusing Lymph: Cervical, supraclavicular normal. Neurologic: Grossly non-focal  Lab Results Recent Labs    05/09/21 0740 05/09/21 1201 05/10/21 0620  WBC  --  2.8* 3.2*  HGB  --  6.9* 8.6*  HCT  --  19.4* 23.8*  NA 137  --  134*  K 4.7  --  4.1  CL 103  --  103  CO2 23  --  25  BUN 41*  --  12  CREATININE 1.06*  --  0.65   Liver Panel Recent Labs    05/08/21 0436  PROT 4.6*  ALBUMIN 2.2*  AST 22  ALT 8  ALKPHOS 37*  BILITOT 0.6  BILIDIR 0.1  IBILI 0.5   Sedimentation Rate No results for input(s): ESRSEDRATE in the last 72 hours. C-Reactive Protein No results for input(s): CRP in the last 72 hours.  Microbiology:  Studies/Results: DG Chest Port 1 View  Result Date: 05/10/2021 CLINICAL DATA:  Abnormal chest x-ray. EXAM: PORTABLE CHEST 1 VIEW COMPARISON:  Radiograph 05/05/2021.  CT 05/08/2021 FINDINGS: Worsening  lung aeration with progressive patchy opacities involving the right greater than left lung. Small pleural effusions have increased. Heart is normal in size, stable mediastinal contours. No pneumothorax. Again seen eventration of right hemidiaphragm. IMPRESSION: Worsening lung aeration with progressive patchy opacities involving the right greater than left lung. Findings may favor multifocal pneumonia over pulmonary edema. Small bilateral pleural effusions. Electronically Signed   By: Keith Rake M.D.   On: 05/10/2021 17:25   ECHOCARDIOGRAM COMPLETE  Result Date: 05/10/2021    ECHOCARDIOGRAM REPORT   Patient Name:   Kim Santiago Date of Exam: 05/10/2021 Medical Rec #:  [id number]        Height:       63.0 in Accession #:    [id number]       Weight:       143.3 lb Date of Birth:  28-Dec-1934        BSA:          1.678 m Patient Age:    60 years          BP:           169/79 mmHg Patient Gender: F                HR:           99 bpm. Exam Location:  [facility] Procedure: 2D Echo and Strain Analysis Indications:     CHF Acute Systolic [id number]  History:         Patient has prior history of Echocardiogram examinations, most                  recent 11/08/2018.  Sonographer:     Kathlen Brunswick RDCS Referring Phys:  [id number] Ottie Glazier Diagnosing Phys: Kate Sable MD  Sonographer Comments: Global longitudinal strain was attempted. IMPRESSIONS  1. Left ventricular ejection fraction, by estimation, is 55 to 60%. The left ventricle has normal function. The left ventricle has no regional wall motion abnormalities. Left ventricular diastolic parameters were normal.  2. Right ventricular systolic function is normal. The right ventricular size is normal.  3. The mitral valve is normal in structure. No evidence of mitral valve regurgitation.  4. The aortic valve is tricuspid. Aortic valve regurgitation is not visualized.  5. The inferior vena cava is normal in size with greater than 50% respiratory variability, suggesting right atrial pressure of 3 mmHg. FINDINGS  Left Ventricle: Left ventricular ejection fraction, by estimation, is 55 to 60%. The left ventricle has normal function. The left ventricle has no regional wall motion abnormalities. Global longitudinal strain performed but not reported based on interpreter judgement due to suboptimal tracking. The left ventricular internal cavity size was normal in size. There is no left ventricular hypertrophy. Left ventricular diastolic parameters were normal. Right Ventricle: The right ventricular size is normal. No increase in right ventricular wall thickness. Right ventricular systolic function is normal. Left Atrium: Left atrial size was normal in size. Right Atrium: Right atrial size was normal in size. Pericardium: There is no evidence of pericardial effusion. Mitral Valve: The mitral valve is normal in structure. No evidence  of mitral valve regurgitation. Tricuspid Valve: The tricuspid valve is normal in structure. Tricuspid valve regurgitation is mild. Aortic Valve: The aortic valve is tricuspid. Aortic valve regurgitation is not visualized. Aortic valve peak gradient measures 6.8 mmHg. Pulmonic Valve: The pulmonic valve was normal in structure. Pulmonic valve regurgitation is trivial. Aorta: The aortic root and ascending aorta are structurally normal, with no evidence  of dilitation. Venous: The inferior vena cava is normal in size with greater than 50% respiratory variability, suggesting right atrial pressure of 3 mmHg. IAS/Shunts: No atrial level shunt detected by color flow Doppler.  LEFT VENTRICLE PLAX 2D LVIDd:         3.50 cm  Diastology LVIDs:         2.50 cm  LV e' medial:    14.10 cm/s LV PW:         1.00 cm  LV E/e' medial:  7.6 LV IVS:        1.00 cm  LV e' lateral:   15.30 cm/s LVOT diam:     1.90 cm  LV E/e' lateral: 7.0 LV SV:         53 LV SV Index:   32 LVOT Area:     2.84 cm  RIGHT VENTRICLE RV Basal diam:  2.90 cm RV S prime:     15.10 cm/s TAPSE (M-mode): 2.1 cm LEFT ATRIUM             Index       RIGHT ATRIUM          Index LA diam:        3.00 cm 1.79 cm/m  RA Area:     9.68 cm LA Vol (A2C):   22.6 ml 13.47 ml/m RA Volume:   20.40 ml 12.16 ml/m LA Vol (A4C):   20.6 ml 12.28 ml/m LA Biplane Vol: 22.5 ml 13.41 ml/m  AORTIC VALVE                PULMONIC VALVE AV Area (Vmax): 2.11 cm    PV Vmax:       0.95 m/s AV Vmax:        130.00 cm/s PV Peak grad:  3.6 mmHg AV Peak Grad:   6.8 mmHg LVOT Vmax:      96.60 cm/s LVOT Vmean:     59.600 cm/s LVOT VTI:       0.187 m  AORTA Ao Root diam: 2.90 cm Ao Asc diam:  3.20 cm MITRAL VALVE                TRICUSPID VALVE MV Area (PHT): 5.16 cm     TV Peak grad:   27.1 mmHg MV Decel Time: 147 msec     TV Vmax:        2.61 m/s MV E velocity: 107.00 cm/s MV A velocity: 33.40 cm/s   SHUNTS MV E/A ratio:  3.20         Systemic VTI:  0.19 m                             Systemic  Diam: 1.90 cm Kate Sable MD Electronically signed by Kate Sable MD Signature Date/Time: 05/10/2021/3:32:19 PM    Final      Assessment/Plan: 86 yr female with cutaneous lupus, sjogrens on methotrexate presented with weakness . Recently diagnosed with COVD on 8/11 and took molnupravir  1) Covid pneumonia - because of immune compromised state it is prolonged and she could have rebound covid because of molnupravir On remdisivir and steroids Acute hypoxic resp failure  with b/l Ggo and infiltrates on oxygen and steroids   Other opportunistic infections workup sent  Anemia and leucopenia    H/O ca breast s/p left mastectomy in 2017 was on letrozole for 5 years for ER/PR + malignancy   /o thyroid cancer-  s/p thyroidectomy, radiation  Hypothyroidism on synthroid  Discussed the management with the patient, daughter and pulmonologist

## 2021-05-10 NOTE — Progress Notes (Signed)
[facility] PROGRESS NOTE    Kim Santiago  VXB:[id number] DOB: 1935/02/01 DOA: 05/05/2021 PCP: [name], MD      Brief Narrative:  Kim Santiago is a 86 y.o. 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  In the ER, HR 106, and CXR showed patchy pneumonia.  Admitted for treatment of pneumonia.         Assessment & Plan:  Pneumonia Possible COVID pneumonitis See summary from 9/2  Crypto antigen negative.   AM cortisol normal.  - Continue remdesivir, steroids - Consult Pulmonlogy, ID  - Follow sputum, fungitell, histo, aspergillus Ag, CMV, fungal antibodies, COVID Ab profile - Hold MTX - Continue flutter and incentive spirometer     Acute respiratory failure with hypoxia Patient presented with respiratory rates 21 to 33 over the first 2 days in the hospita, now persistently requiring 2L O2 Still on O2  Acute on chronic macrocytic anemia Chronic anemia ~10 g/dL   Here, acutely worse, down to 6.9 g/dL on 9/2.  Transfused 1 unit yesterday, posttransfusion hemoglobin today up to 8 No hemolysis or bleeding B12 and folate normal, TSH normal - Hold methotrexate - Trend CBC - Consult hematology, appreciate cares - Continue Folate    Leukopenia - Consult hematology, they recommend watchful waiting, biopsy if worsening over weekend  Hyponatremia Stable, asymptomatic  Cutaneous lupus - Hold methotrexate  History of breast cancer  Hypothyroidism - Continue levothyroxine  Hypertension Coronary disease Blood pressure control - Continue atorvastatin, losartan, metoprolol - Hold Lasix    Depression - Continue sertraline and trazodone  GERD - Continue PPI            Disposition: Status is: Inpatient  Remains inpatient appropriate because:Inpatient level of care appropriate due to severity of  illness and ongoing work up requiring inpatient care  Dispo: The patient is from: Home              Anticipated d/c is to: SNF              Patient currently is not medically stable to d/c.   Difficult to place patient No   Level of care: Med-Surg             MDM: The below labs and imaging reports were reviewed and summarized above.  Medication management as above.    DVT prophylaxis: enoxaparin (LOVENOX) injection 40 mg Start: 05/05/21 2330  Code Status: DNR Family Communication: Daughter at the bedside             Subjective: Fever yesterday, none yet this morning.  Cough and fatigue really unchanged, no chest pain.  Hematochezia, nosebleeds, melena.  Aphthous ulcers in her mouth unchanged.         Objective: Vitals:   05/09/21 2040 05/09/21 2228 05/10/21 0605 05/10/21 0903  BP: (!) 134/50 (!) 132/49 (!) 169/89 (!) 169/79  Pulse: 100 85 100 (!) 105  Resp: 18 18 18 18   Temp: 98.2 F (36.8 C) 98 F (36.7 C) 97.9 F (36.6 C) 97.8 F (36.6 C)  TempSrc: Oral     SpO2: 97% 97% 97% (!) 87%  Weight:      Height:        Intake/Output Summary (Last 24 hours) at 05/10/2021 1152 Last data filed at 05/10/2021 0300 Gross per 24 hour  Intake 566.67 ml  Output 300 ml  Net 266.67 ml  Filed Weights   05/05/21 1731  Weight: 65 kg    Examination: General appearance: Elderly female, lying in bed, no acute distress, appears tired     HEENT:    Skin:  Cardiac: RRR, no murmurs, no lower extremity edema Respiratory: Rales in the right, nothing on the left, no wheezing, respiratory effort appears normal, nasal cannula in place Abdomen: Abdomen soft no tenderness palpation or guarding, no ascites or distention MSK:  Neuro: Awake and alert, extraocular movements intact, moves upper extremities with generalized weakness, speech fluent Psych: Sleepy, attention normal, affect judgment appear normal      Data Reviewed: I have personally reviewed following  labs and imaging studies:  CBC: Recent Labs  Lab 05/06/21 0522 05/07/21 0551 05/08/21 0436 05/09/21 1201 05/10/21 0620  WBC 3.2* 2.8* 2.3* 2.8* 3.2*  HGB 8.3* 7.2* 7.1* 6.9* 8.6*  HCT 23.4* 20.4* 20.2* 19.4* 23.8*  MCV 105.9* 109.1* 105.2* 107.2* 101.3*  PLT 121* 131* 137* 153 459*   Basic Metabolic Panel: Recent Labs  Lab 05/06/21 0522 05/07/21 0551 05/08/21 0436 05/09/21 0740 05/10/21 0620  NA 130* 130* 130* 137 134*  K 3.8 3.9 4.1 4.7 4.1  CL 96* 99 100 103 103  CO2 26 26 26 23 25   GLUCOSE 91 94 103* 176* 133*  BUN 15 15 17  41* 12  CREATININE 0.76 0.67 0.82 1.06* 0.65  CALCIUM 8.0* 7.6* 7.7* 10.3 8.0*  MG  --  1.9  --   --   --    GFR: Estimated Creatinine Clearance: 47.4 mL/min (by C-G formula based on SCr of 0.65 mg/dL). Liver Function Tests: Recent Labs  Lab 05/08/21 0436  AST 22  ALT 8  ALKPHOS 37*  BILITOT 0.6  PROT 4.6*  ALBUMIN 2.2*   No results for input(s): LIPASE, AMYLASE in the last 168 hours. No results for input(s): AMMONIA in the last 168 hours. Coagulation Profile: No results for input(s): INR, PROTIME in the last 168 hours. Cardiac Enzymes: No results for input(s): CKTOTAL, CKMB, CKMBINDEX, TROPONINI in the last 168 hours. BNP (last 3 results) No results for input(s): PROBNP in the last 8760 hours. HbA1C: No results for input(s): HGBA1C in the last 72 hours. CBG: No results for input(s): GLUCAP in the last 168 hours. Lipid Profile: No results for input(s): CHOL, HDL, LDLCALC, TRIG, CHOLHDL, LDLDIRECT in the last 72 hours. Thyroid Function Tests: Recent Labs    05/09/21 0740  TSH 1.630   Anemia Panel: Recent Labs    05/08/21 0436 05/09/21 0740  VITAMINB12  --  4,824*  FOLATE  --  17.5  RETICCTPCT 1.7  --    Urine analysis:    Component Value Date/Time   COLORURINE YELLOW (A) 05/08/2021 1810   APPEARANCEUR CLEAR (A) 05/08/2021 1810   LABSPEC 1.044 (H) 05/08/2021 1810   PHURINE 5.0 05/08/2021 1810   GLUCOSEU NEGATIVE  05/08/2021 1810   GLUCOSEU NEGATIVE 05/11/2017 1143   HGBUR NEGATIVE 05/08/2021 1810   BILIRUBINUR NEGATIVE 05/08/2021 1810   BILIRUBINUR negative 07/04/2018 1134   KETONESUR 5 (A) 05/08/2021 1810   PROTEINUR NEGATIVE 05/08/2021 1810   UROBILINOGEN 0.2 07/04/2018 1134   UROBILINOGEN 0.2 05/11/2017 1143   NITRITE NEGATIVE 05/08/2021 1810   LEUKOCYTESUR NEGATIVE 05/08/2021 1810   Sepsis Labs: @LABRCNTIP (procalcitonin:4,lacticacidven:4)  ) Recent Results (from the past 240 hour(s))  Resp Panel by RT-PCR (Flu A&B, Covid) Nasopharyngeal Swab     Status: Abnormal   Collection Time: 05/05/21  9:49 PM   Specimen: Nasopharyngeal Swab; Nasopharyngeal(NP) swabs  in vial transport medium  Result Value Ref Range Status   SARS Coronavirus 2 by RT PCR POSITIVE (A) NEGATIVE Final    Comment: RESULT CALLED TO, READ BACK BY AND VERIFIED WITH: SANTIA BULLOCK@2330  05/05/21 RH (NOTE) SARS-CoV-2 target nucleic acids are DETECTED.  The SARS-CoV-2 RNA is generally detectable in upper respiratory specimens during the acute phase of infection. Positive results are indicative of the presence of the identified virus, but do not rule out bacterial infection or co-infection with other pathogens not detected by the test. Clinical correlation with patient history and other diagnostic information is necessary to determine patient infection status. The expected result is Negative.  Fact Sheet for Patients: EntrepreneurPulse.com.au  Fact Sheet for Healthcare Providers: IncredibleEmployment.be  This test is not yet approved or cleared by the [place] FDA and  has been authorized for detection and/or diagnosis of SARS-CoV-2 by FDA under an Emergency Use Authorization (EUA).  This EUA will remain in effect (meaning this test can be [place] ed) for the duration of  the COVID-19 declaration under Section 564(b)(1) of the Act, 21 U.S.C. section 360bbb-3(b)(1), unless the  authorization is terminated or revoked sooner.     Influenza A by PCR NEGATIVE NEGATIVE Final   Influenza B by PCR NEGATIVE NEGATIVE Final    Comment: (NOTE) The Xpert Xpress SARS-CoV-2/FLU/RSV plus assay is intended as an aid in the diagnosis of influenza from Nasopharyngeal swab specimens and should not be used as a sole basis for treatment. Nasal washings and aspirates are unacceptable for Xpert Xpress SARS-CoV-2/FLU/RSV testing.  Fact Sheet for Patients: EntrepreneurPulse.com.au  Fact Sheet for Healthcare Providers: IncredibleEmployment.be  This test is not yet approved or cleared by the [place] FDA and has been authorized for detection and/or diagnosis of SARS-CoV-2 by FDA under an Emergency Use Authorization (EUA). This EUA will remain in effect (meaning this test can be used) for the duration of the COVID-19 declaration under Section 564(b)(1) of the Act, 21 U.S.C. section 360bbb-3(b)(1), unless the authorization is terminated or revoked.  Performed at [facility], [address] [place], [place] [address]   Blood culture (routine x 2)     Status: None   Collection Time: 05/05/21 10:18 PM   Specimen: BLOOD  Result Value Ref Range Status   Specimen Description BLOOD RIGHT HAND  Final   Special Requests   Final    BOTTLES DRAWN AEROBIC AND ANAEROBIC Blood Culture adequate volume   Culture   Final    NO GROWTH 5 DAYS Performed at [facility], [address] [place], [place] [address]    Report Status 05/10/2021 FINAL  Final  Blood culture (routine x 2)     Status: None   Collection Time: 05/05/21 11:17 PM   Specimen: BLOOD  Result Value Ref Range Status   Specimen Description BLOOD RIGHT ARM  Final   Special Requests   Final    BOTTLES DRAWN AEROBIC AND ANAEROBIC Blood Culture adequate volume   Culture   Final    NO GROWTH 5 DAYS Performed at [facility], [address],  [place], [place] [address]    Report Status 05/10/2021 FINAL  Final  MRSA Next Gen by PCR, Nasal     Status: None   Collection Time: 05/07/21  9:16 AM   Specimen: Nasal Mucosa; Nasal Swab  Result Value Ref Range Status   MRSA by PCR Next Gen NOT DETECTED NOT DETECTED Final    Comment: (NOTE) The GeneXpert MRSA Assay (FDA  approved for NASAL specimens only), is one component of a comprehensive MRSA colonization surveillance program. It is not intended to diagnose MRSA infection nor to guide or monitor treatment for MRSA infections. Test performance is not FDA approved in patients less than 32 years old. Performed at [facility], [place], [place] [address]   Expectorated Sputum Assessment w Gram Stain, Rflx to Resp Cult     Status: None   Collection Time: 05/07/21  1:18 PM   Specimen: Sputum  Result Value Ref Range Status   Specimen Description SPUTUM  Final   Special Requests NONE  Final   Sputum evaluation   Final    Sputum specimen not acceptable for testing.  Please recollect.   RESULT CALLED TO, READ BACK BY AND VERIFIED WITH: Pat Patrick 05/07/21 1411 KLW Performed at [facility], [address] [place], [place] [address]    Report Status 05/07/2021 FINAL  Final  Respiratory (~20 pathogens) panel by PCR     Status: None   Collection Time: 05/07/21  4:59 PM   Specimen: Nasopharyngeal Swab; Respiratory  Result Value Ref Range Status   Adenovirus NOT DETECTED NOT DETECTED Final   Coronavirus 229E NOT DETECTED NOT DETECTED Final    Comment: (NOTE) The Coronavirus on the Respiratory Panel, DOES NOT test for the novel  Coronavirus (2019 nCoV)    Coronavirus HKU1 NOT DETECTED NOT DETECTED Final   Coronavirus NL63 NOT DETECTED NOT DETECTED Final   Coronavirus OC43 NOT DETECTED NOT DETECTED Final   Metapneumovirus NOT DETECTED NOT DETECTED Final   Rhinovirus / Enterovirus NOT DETECTED NOT DETECTED Final   Influenza A NOT DETECTED NOT DETECTED  Final   Influenza B NOT DETECTED NOT DETECTED Final   Parainfluenza Virus 1 NOT DETECTED NOT DETECTED Final   Parainfluenza Virus 2 NOT DETECTED NOT DETECTED Final   Parainfluenza Virus 3 NOT DETECTED NOT DETECTED Final   Parainfluenza Virus 4 NOT DETECTED NOT DETECTED Final   Respiratory Syncytial Virus NOT DETECTED NOT DETECTED Final   Bordetella pertussis NOT DETECTED NOT DETECTED Final   Bordetella Parapertussis NOT DETECTED NOT DETECTED Final   Chlamydophila pneumoniae NOT DETECTED NOT DETECTED Final   Mycoplasma pneumoniae NOT DETECTED NOT DETECTED Final    Comment: Performed at [facility], [address] [place], [place] [address]  Expectorated Sputum Assessment w Gram Stain, Rflx to Resp Cult     Status: None   Collection Time: 05/07/21  7:19 PM   Specimen: Expectorated Sputum  Result Value Ref Range Status   Specimen Description EXPECTORATED SPUTUM  Final   Special Requests NONE  Final   Sputum evaluation   Final    THIS SPECIMEN IS ACCEPTABLE FOR SPUTUM CULTURE Performed at [facility], [address], [place], [place] [address]    Report Status 05/07/2021 FINAL  Final  Culture, Respiratory w Gram Stain     Status: None (Preliminary result)   Collection Time: 05/07/21  7:19 PM  Result Value Ref Range Status   Specimen Description   Final    EXPECTORATED SPUTUM Performed at [facility], [address] [place], [place] [address]    Special Requests   Final    NONE Reflexed from [id number] Performed at [facility], [place], [place] [address]    Gram Stain   Final    NO WBC SEEN RARE GRAM POSITIVE COCCI IN PAIRS RARE GRAM POSITIVE RODS RARE BUDDING YEAST SEEN  Culture   Final    CULTURE REINCUBATED FOR BETTER GROWTH Performed at [facility], [place] [address], [place], [place] [address]    Report Status PENDING  Incomplete         Radiology Studies: CT CHEST W CONTRAST  Result Date:  05/08/2021 CLINICAL DATA:  86 year old female with history of pneumonia noted on recent chest x-ray. COVID positive patient. Evaluate for pneumonia versus interstitial lung disease. History of Sjogren's disease and cutaneous lupus. EXAM: CT CHEST WITH CONTRAST TECHNIQUE: Multidetector CT imaging of the chest was performed during intravenous contrast administration. CONTRAST:  68m OMNIPAQUE IOHEXOL 350 MG/ML SOLN COMPARISON:  Chest CT 05/05/2021. FINDINGS: Cardiovascular: Heart size is normal. There is no significant pericardial fluid, thickening or pericardial calcification. There is aortic atherosclerosis, as well as atherosclerosis of the great vessels of the mediastinum and the coronary arteries, including calcified atherosclerotic plaque in the left anterior descending and right coronary arteries. Calcifications of the mitral annulus. Mediastinum/Nodes: Multiple prominent borderline enlarged mediastinal lymph nodes are noted, nonspecific and likely reactive. No definite pathologically enlarged mediastinal or hilar lymph nodes. Small hiatal hernia. No axillary lymphadenopathy. Lungs/Pleura: Widespread but patchy areas of ground-glass attenuation, interlobular septal thickening and thickening of the peribronchovascular interstitium are noted throughout all aspects of the lungs bilaterally, indicative of severe multilobar bilateral bronchopneumonia, presumably secondary to reported COVID infection. Trace bilateral pleural effusions layer dependently. Upper Abdomen: Aortic atherosclerosis.  Status post cholecystectomy. Musculoskeletal: Status post left modified radical mastectomy. Chronic compression fracture of L1 with 25% loss of anterior vertebral body height and post vertebroplasty changes. There are no aggressive appearing lytic or blastic lesions noted in the visualized portions of the skeleton. IMPRESSION: 1. Severe multilobar bilateral bronchopneumonia, presumably secondary to reported COVID infection. 2.  Trace bilateral pleural effusions. 3. Small hiatal hernia. 4. Aortic atherosclerosis, in addition to 2 vessel coronary artery disease. Aortic Atherosclerosis (ICD10-I70.0). Electronically Signed   By: DVinnie LangtonM.D.   On: 05/08/2021 16:15        Scheduled Meds:  acidophilus  1 capsule Oral Daily   atorvastatin  20 mg Oral Daily   calcium-vitamin D  2 tablet Oral Daily   cholecalciferol  2,000 Units Oral Daily   enoxaparin (LOVENOX) injection  40 mg Subcutaneous QT53U  folic acid  1 mg Oral Daily   guaiFENesin  600 mg Oral BID   levothyroxine  75 mcg Oral Q0600   losartan  25 mg Oral Daily   methylPREDNISolone (SOLU-MEDROL) injection  40 mg Intravenous Daily   metoprolol succinate  25 mg Oral Daily   pantoprazole  40 mg Oral Daily   polyethylene glycol  17 g Oral Daily   sertraline  25 mg Oral Daily   traZODone  50 mg Oral QHS   Continuous Infusions:  sodium chloride 100 mL/hr at 05/08/21 0642   cefTRIAXone (ROCEPHIN)  IV 2 g (05/10/21 0250)   remdesivir 100 mg in NS 100 mL       LOS: 5 days    Time spent: 25 minutes    CEdwin Dada MD [facility] 05/10/2021, 11:52 AM     Please page though AShea Evansor Epic secure chat:  For [employer] cAdult nurse

## 2021-05-11 LAB — COMPREHENSIVE METABOLIC PANEL
ALT: 8 U/L (ref 0–44)
AST: 23 U/L (ref 15–41)
Albumin: 2.3 g/dL — ABNORMAL LOW (ref 3.5–5.0)
Alkaline Phosphatase: 49 U/L (ref 38–126)
Anion gap: 7 (ref 5–15)
BUN: 23 mg/dL (ref 8–23)
CO2: 29 mmol/L (ref 22–32)
Calcium: 8.4 mg/dL — ABNORMAL LOW (ref 8.9–10.3)
Chloride: 97 mmol/L — ABNORMAL LOW (ref 98–111)
Creatinine, Ser: 0.75 mg/dL (ref 0.44–1.00)
GFR, Estimated: >60 mL/min (ref >60)
Glucose, Bld: 133 mg/dL — ABNORMAL HIGH (ref 70–99)
Potassium: 3.8 mmol/L (ref 3.5–5.1)
Sodium: 133 mmol/L — ABNORMAL LOW (ref 135–145)
Total Bilirubin: 0.7 mg/dL (ref 0.3–1.2)
Total Protein: 5 g/dL — ABNORMAL LOW (ref 6.5–8.1)

## 2021-05-11 LAB — CBC
HCT: 24.6 % — ABNORMAL LOW (ref 36.0–46.0)
Hemoglobin: 8.8 g/dL — ABNORMAL LOW (ref 12.0–15.0)
MCH: 35.8 pg — ABNORMAL HIGH (ref 26.0–34.0)
MCHC: 35.8 g/dL (ref 30.0–36.0)
MCV: 100 fL (ref 80.0–100.0)
Platelets: 136 10*3/uL — ABNORMAL LOW (ref 150–400)
RBC: 2.46 MIL/uL — ABNORMAL LOW (ref 3.87–5.11)
RDW: 18.5 % — ABNORMAL HIGH (ref 11.5–15.5)
WBC: 3 10*3/uL — ABNORMAL LOW (ref 4.0–10.5)
nRBC: 0 % (ref 0.0–0.2)

## 2021-05-11 LAB — C-REACTIVE PROTEIN: CRP: 13.7 mg/dL — ABNORMAL HIGH (ref <1.0)

## 2021-05-11 MED ORDER — METHYLPREDNISOLONE SODIUM SUCC 40 MG IJ SOLR
20.0000 mg | Freq: Every day | INTRAMUSCULAR | Status: DC
  Administered 2021-05-12 – 2021-05-13 (×2): 20 mg via INTRAVENOUS
  Filled 2021-05-11 (×2): qty 1

## 2021-05-11 NOTE — Evaluation (Signed)
Occupational Therapy Evaluation Patient Details Name: Kim Santiago MRN: [id number] DOB: [birth year] Today's Date: 05/11/2021    History of Present Illness 86 y.o.female with medical history significant for hypertension, hypothyroidism, cutaneous lupus, coronary artery disease, Sjogren's syndrome and breast cancer status post left mastectomy, who presented to the ER with acute onset of significant generalized weakness.  Was diagnosed with COVID-19 about 4 weeks prior to admission.   Clinical Impression   Pt seen for OT evaluation this date. Upon arrival to room, pt awake in bed, with daughter present. Pt agreeable to OT eval/tx. Until the last month, pt was independent with functional mobility and ADLs. Within last month, pt was using cane/walker d/t fatigue. Pt currently requires SUPERVISION for seated LB dressing, SUPERVISION for functional mobility of short household distances with 4ww, and SUPERVISION for standing grooming tasks due to current functional impairments (See OT Problem List below). Pt educated in energy conservation strategies including pursed lip breathing, activity pacing, and positioning (I.e., seated rest breaks). Pt verbalized understanding and would benefit from additional skilled OT services to maximize recall and carryover of learned techniques and facilitate implementation of learned techniques into daily routines. Upon discharge, recommend [facility] services.       Follow Up Recommendations  Home health OT;Supervision - Intermittent    Equipment Recommendations  None recommended by OT       Precautions / Restrictions Precautions Precautions: Fall Restrictions Weight Bearing Restrictions: No      Mobility Bed Mobility Overal bed mobility: Modified Independent             General bed mobility comments: With use of bedrails, pt able to perform without physical assist    Transfers Overall transfer level: Needs assistance Equipment used: 4-wheeled  walker Transfers: Sit to/from Stand Sit to Stand: Min guard         General transfer comment: Verbal cues for safe hand placement    Balance Overall balance assessment: Needs assistance Sitting-balance support: No upper extremity supported;Feet supported Sitting balance-Leahy Scale: Good Sitting balance - Comments: good sitting balance reachingout outside BOS to don/doff socks   Standing balance support: Bilateral upper extremity supported;During functional activity Standing balance-Leahy Scale: Good Standing balance comment: SUPERVISION for performing bimanual grooming tasks                           ADL either performed or assessed with clinical judgement   ADL Overall ADL's : Needs assistance/impaired     Grooming: Wash/dry hands;Wash/dry face;Oral care;Supervision/safety;Set up;Standing Grooming Details (indicate cue type and reason): SUPERVISION d/t decreased endurance             Lower Body Dressing: Supervision/safety;Sitting/lateral leans Lower Body Dressing Details (indicate cue type and reason): to don/doff socks             Functional mobility during ADLs: Supervision/safety;Rolling walker       Vision Baseline Vision/History: 1 Wears glasses Ability to See in Adequate Light: 0 Adequate              Pertinent Vitals/Pain Pain Assessment: No/denies pain        Extremity/Trunk Assessment Upper Extremity Assessment Upper Extremity Assessment: Generalized weakness;LUE deficits/detail RUE Deficits / Details: Grossly 4/5 in all movements RUE Sensation: WNL RUE Coordination: WNL LUE Deficits / Details: Pt endorsing decreased sensation and strength compared to right at baseline. Grossly 4-/5 in all movements. Decreased light touch compared to RUE LUE Sensation: decreased light touch LUE Coordination: decreased fine  motor   Lower Extremity Assessment Lower Extremity Assessment: Generalized weakness       Communication  Communication Communication: No difficulties   Cognition Arousal/Alertness: Awake/alert Behavior During Therapy: WFL for tasks assessed/performed Overall Cognitive Status: Within Functional Limits for tasks assessed                                     General Comments  Pt on 1L of O2 via [place], SpO2 > 91% throughout session    Exercises Other Exercises Other Exercises: Education on energy conservation strategies including positioning, pacing, and purse lip breathing, with pt verbalizing understanding   Shoulder Instructions      Home Living Family/patient expects to be discharged to:: Private residence Living Arrangements: Children Available Help at Discharge: Available PRN/intermittently;Family;Friend(s) (can provide 24/7 if needed) Type of Home: House Home Access: Level entry     Home Layout: Multi-level;Able to live on main level with bedroom/bathroom     Bathroom Shower/Tub: Walk-in shower         Home Equipment: Environmental consultant - 2 wheels;Cane - single point;Bedside commode;Shower seat - built in;Grab bars - tub/shower;Walker - 4 wheels          Prior Functioning/Environment Level of Independence: Independent        Comments: Until the last month, pt was independent with functional mobility and ADLs. Within last month, was using cane/walker d/t fatigue        OT Problem List: Decreased strength;Decreased activity tolerance;Impaired balance (sitting and/or standing);Decreased knowledge of use of DME or AE;Impaired sensation      OT Treatment/Interventions: Self-care/ADL training;Therapeutic exercise;Energy conservation;DME and/or AE instruction;Therapeutic activities;Patient/family education;Balance training    OT Goals(Current goals can be found in the care plan section) Acute Rehab OT Goals Patient Stated Goal: to go home OT Goal Formulation: With patient Time For Goal Achievement: 05/25/21 Potential to Achieve Goals: Good ADL Goals Pt Will Perform  Grooming: with modified independence;standing Pt Will Perform Lower Body Dressing: with supervision;sit to/from stand Pt Will Transfer to Toilet: with modified independence;ambulating;regular height toilet  OT Frequency: Min 1X/week    AM-PAC OT "6 Clicks" Daily Activity     Outcome Measure Help from another person eating meals?: None Help from another person taking care of personal grooming?: A Little Help from another person toileting, which includes using toliet, bedpan, or urinal?: A Little Help from another person bathing (including washing, rinsing, drying)?: A Little Help from another person to put on and taking off regular upper body clothing?: None Help from another person to put on and taking off regular lower body clothing?: A Little 6 Click Score: 20   End of Session Equipment Utilized During Treatment: Rolling walker Nurse Communication: Mobility status  Activity Tolerance: Patient tolerated treatment well Patient left: in bed;with call bell/phone within reach;with bed alarm set;with family/visitor present  OT Visit Diagnosis: Muscle weakness (generalized) (M62.81);History of falling (Z91.81)                Time: [id number] OT Time Calculation (min): 34 min Charges:  OT General Charges $OT Visit: 1 Visit OT Evaluation $OT Eval Moderate Complexity: 1 Mod OT Treatments $Self Care/Home Management : 23-37 mins  Fredirick Maudlin, OTR/L [place]

## 2021-05-11 NOTE — Progress Notes (Signed)
Pulmonary Medicine          Date: 05/11/2021,   MRN# [id number] Kim Santiago [birth year]     AdmissionWeight: 65 kg                 CurrentWeight: 65 kg   Referring physician: Dr. Loleta Books   CHIEF COMPLAINT:   Refractory pneumonia with with complex comorbid history   HISTORY OF PRESENT ILLNESS   This is a pleasant 86 year old female she has a history of osteoarthritis, breast cancer with left mastectomy in 2017 as well as lymph node resection, chronic kidney disease, history of MI, essential hypertension, hypothyroidism post thyroidectomy due to thyroid cancer, has a history of lupus with Sjogren's overlap, she is postmenopausal, she does have osteoarthritis, systolic heart failure with ejection fraction of 35%, who initially came into the emergency room with complaints of malaise post COVID-19 4 weeks ago.  She does have residual cough and states that intermittent expectoration of phlegm with yellow discoloration is ongoing with possible progression.  She denied GI symptoms specifically nausea vomiting and diarrhea was absent, she denied chest pain falls, confusion, seizure activity, visual impairment.  She did have immunization with COVID-19 with a total of 3 doses.  She was tachycardic on arrival with hyponatremia at 129 mild lactic acidosis chronic anemia with a hemoglobin of 9 and had another COVID-19 test when she came in which was positive still from previous.  For her systemic inflammatory autoimmune disease she does take methotrexate 15 mg once weekly.  Respiratory cultures were done which are negative at this time, also patient had non-COVID respiratory viral panel with 20 organisms reviewed all negative.  There is serum Fungitell in process. Patient remains febrile. Procalcitonin was also done which was essentially normal.  PCCM consultation placed for additional evaluation management.   05/10/21- Patient evaluated at bedside. She had TTE which is essentially normal, BNP  also wnl so unlikely cardiac dysfunction playing a role here. We discussed gentle diuresis today and continued bronchopulmonary hyginene and PT as previous.  Will dc IVF today as patient does have mild pedal edema now and pulmonary congestion. Repeat CXR. Pathology PBF due to relative tricytopenia. [name] her daugher was present and shares patient has been weaker post CVA 2019. She would benefit from PT  05/11/21- patient reports clinical improvement.  She had loose stools with improvement today.  WE are weaning Oxygen therapy and patient is eating now. Urine output is not documented but wall suction container had 1Liter. Solumderol decreased to '20mg'$  today. CXR reviewed with multifocal pneumonia. Patient had no fever overnight. Continue lasix 20 bid  PAST MEDICAL HISTORY   Past Medical History:  Diagnosis Date   Arthritis    Breast cancer ([place]) 2017   left mastectomy done 11/2015   Breast cancer in female [facility]) 11/18/2015   Left: 3.9 cm tumor, T2, 1/2 sentinel nodes positive for macro metastatic disease, N1, 3 negative nodes in the axillary tail, ER+,PR+, Her 2 neu, low Mammoprint score   Cancer (HCC)    thyroid takes levothyroxine   Chronic kidney disease    UTI   Genetic screening March 2017.   Mammoprint of left breast cancer: Low risk for recurrence.    History of heart attack 06/12/2014   Hypertension    hypothyroidism    secondary to thyroidectomy for thyroid ca   Hypothyroidism    Lupus (HCC)    subcutaneous   Menopause 40s   natural, hot flashes and mood lability now  gone, off prempro 7 months   Myocardial infarction [facility]) 2013   Osteoporosis    Osteopenia   Rosacea    Sjoegren syndrome    Stress-induced cardiomyopathy September of 2013   EF 35%. Peak troponin was 1.8.     SURGICAL HISTORY   Past Surgical History:  Procedure Laterality Date   BACK SURGERY     BREAST BIOPSY Left 10/30/15   positive, done in Dr. Dwyane Luo office   CARDIAC CATHETERIZATION  05/2012   [place].  No significant CAD. Ejection fraction of 35% due to stress-induced cardiomyopathy.   CHOLECYSTECTOMY     COLONOSCOPY     DILATION AND CURETTAGE OF UTERUS     KYPHOSIS SURGERY  Feb 2008   L1, Dr. Mauri Pole   LUMBAR DISC SURGERY     L4-L5   MASTECTOMY Left 11/18/2015   positive   SENTINEL NODE BIOPSY Left 11/18/2015   Procedure: SENTINEL NODE BIOPSY;  Surgeon: Robert Bellow, MD;  Location: [facility] ORS;  Service: General;  Laterality: Left;   SHOULDER ARTHROSCOPY  2004   Left, Dr. Jefm Bryant   SIMPLE MASTECTOMY WITH AXILLARY SENTINEL NODE BIOPSY Left 11/18/2015   Procedure: SIMPLE MASTECTOMY;  Surgeon: Robert Bellow, MD;  Location: [facility] ORS;  Service: General;  Laterality: Left;   SPINE SURGERY     L4-5 diskectomy   THYROIDECTOMY     Thyroid Cancer   TONSILLECTOMY     TUBAL LIGATION       FAMILY HISTORY   Family History  Problem Relation Age of Onset   Kidney disease Mother    Heart disease Mother    COPD Father    Cancer Father        esophageal   Kidney disease Sister    Cancer Brother 72       colon cancer (both brothers)   Heart attack Brother 78   Heart disease Brother    Breast cancer Neg Hx      SOCIAL HISTORY   Social History   Tobacco Use   Smoking status: Never   Smokeless tobacco: Never  Vaping Use   Vaping Use: Never used  Substance Use Topics   Alcohol use: No   Drug use: No     MEDICATIONS    Home Medication:    Current Medication:  Current Facility-Administered Medications:    acetaminophen (TYLENOL) tablet 650 mg, 650 mg, Oral, Q6H PRN, 650 mg at 05/09/21 1546 **OR** acetaminophen (TYLENOL) suppository 650 mg, 650 mg, Rectal, Q6H PRN, Mansy, Jan A, MD   acidophilus (RISAQUAD) capsule 1 capsule, 1 capsule, Oral, Daily, Mansy, Jan A, MD, 1 capsule at 05/11/21 0915   atorvastatin (LIPITOR) tablet 20 mg, 20 mg, Oral, Daily, Mansy, Jan A, MD, 20 mg at 05/11/21 0915   calcium-vitamin D (OSCAL WITH D) 500-200 MG-UNIT per tablet 2 tablet, 2  tablet, Oral, Daily, Mansy, Jan A, MD, 2 tablet at 05/11/21 0915   cholecalciferol (VITAMIN D3) tablet 2,000 Units, 2,000 Units, Oral, Daily, Mansy, Jan A, MD, 2,000 Units at 05/11/21 0914   enoxaparin (LOVENOX) injection 40 mg, 40 mg, Subcutaneous, Q24H, Mansy, Jan A, MD, 40 mg at [id number]   folic acid (FOLVITE) tablet 1 mg, 1 mg, Oral, Daily, Mansy, Jan A, MD, 1 mg at 05/10/21 1023   furosemide (LASIX) injection 20 mg, 20 mg, Intravenous, BID, Letizia Hook, MD, 20 mg at 05/11/21 0915   guaiFENesin (MUCINEX) 12 hr tablet 600 mg, 600 mg, Oral, BID, Mansy, Jan A, MD, 600 mg  at 05/11/21 0915   ibuprofen (ADVIL) tablet 400 mg, 400 mg, Oral, Q6H PRN, Edwin Dada, MD, 400 mg at 05/09/21 1815   levothyroxine (SYNTHROID) tablet 75 mcg, 75 mcg, Oral, Q0600, Mansy, Jan A, MD, 75 mcg at 05/11/21 0601   losartan (COZAAR) tablet 25 mg, 25 mg, Oral, Daily, Mansy, Jan A, MD, 25 mg at 05/11/21 0915   magnesium hydroxide (MILK OF MAGNESIA) suspension 30 mL, 30 mL, Oral, Daily PRN, Mansy, Jan A, MD   methylPREDNISolone sodium succinate (SOLU-MEDROL) 40 mg/mL injection 40 mg, 40 mg, Intravenous, Daily, Danford, Suann Larry, MD, 40 mg at 05/11/21 [id number]   metoprolol succinate (TOPROL-XL) 24 hr tablet 25 mg, 25 mg, Oral, Daily, Mansy, Jan A, MD, 25 mg at 05/11/21 0915   nitroGLYCERIN (NITROSTAT) SL tablet 0.4 mg, 0.4 mg, Sublingual, Q5 min PRN, Mansy, Jan A, MD   ondansetron (ZOFRAN) tablet 4 mg, 4 mg, Oral, Q6H PRN **OR** ondansetron (ZOFRAN) injection 4 mg, 4 mg, Intravenous, Q6H PRN, Mansy, Jan A, MD, 4 mg at 05/08/21 [id number]   pantoprazole (PROTONIX) EC tablet 40 mg, 40 mg, Oral, Daily, Mansy, Jan A, MD, 40 mg at 05/11/21 0915   polyethylene glycol (MIRALAX / GLYCOLAX) packet 17 g, 17 g, Oral, Daily, Mansy, Jan A, MD, 17 g at 05/09/21 1003   remdesivir 100 mg in sodium chloride 0.9 % 100 mL IVPB, 100 mg, Intravenous, Daily, Danford, Suann Larry, MD, Stopped at 05/11/21 0959   sertraline (ZOLOFT)  tablet 25 mg, 25 mg, Oral, Daily, Mansy, Jan A, MD, 25 mg at 05/11/21 0915   tiZANidine (ZANAFLEX) tablet 2 mg, 2 mg, Oral, TID PRN, Ezekiel Slocumb, DO   traZODone (DESYREL) tablet 25 mg, 25 mg, Oral, QHS PRN, Mansy, Jan A, MD   traZODone (DESYREL) tablet 50 mg, 50 mg, Oral, QHS, Mansy, Jan A, MD, 50 mg at 05/10/21 2237    ALLERGIES   Azathioprine, Hydroxychloroquine, Mycophenolate mofetil, Amoxicillin, Codeine, Naprosyn [naproxen], Orudis [ketoprofen], Sulfa antibiotics, and Sulfathiazole     REVIEW OF SYSTEMS    Review of Systems:  Gen:  Denies  fever, sweats, chills weigh loss  HEENT: Denies blurred vision, double vision, ear pain, eye pain, hearing loss, nose bleeds, sore throat Cardiac:  No dizziness, chest pain or heaviness, chest tightness,edema Resp:   Denies cough or sputum porduction, shortness of breath,wheezing, hemoptysis,  Gi: Denies swallowing difficulty, stomach pain, nausea or vomiting, diarrhea, constipation, bowel incontinence Gu:  Denies bladder incontinence, burning urine Ext:   Denies Joint pain, stiffness or swelling Skin: Denies  skin rash, easy bruising or bleeding or hives Endoc:  Denies polyuria, polydipsia , polyphagia or weight change Psych:   Denies depression, insomnia or hallucinations   Other:  All other systems negative   VS: BP (!) 142/68 (BP Location: Right Arm)   Pulse 83   Temp 98.1 F (36.7 C)   Resp 18   Ht '5\' 3"'$  (1.6 m)   Wt 65 kg   SpO2 100%   BMI 25.38 kg/m      PHYSICAL EXAM    GENERAL:NAD, no fevers, chills, no weakness no fatigue HEAD: Normocephalic, atraumatic.  EYES: Pupils equal, round, reactive to light. Extraocular muscles intact. No scleral icterus.  MOUTH: Moist mucosal membrane. Dentition intact. No abscess noted.  EAR, NOSE, THROAT: Clear without exudates. No external lesions.  NECK: Supple. No thyromegaly. No nodules. No JVD.  PULMONARY: clear to auscultation CARDIOVASCULAR: S1 and S2. Regular rate and  rhythm. No murmurs, rubs, or gallops.  No edema. Pedal pulses 2+ bilaterally.  GASTROINTESTINAL: Soft, nontender, nondistended. No masses. Positive bowel sounds. No hepatosplenomegaly.  MUSCULOSKELETAL: No swelling, clubbing, or edema. Range of motion full in all extremities.  NEUROLOGIC: Cranial nerves II through XII are intact. No gross focal neurological deficits. Sensation intact. Reflexes intact.  SKIN: No ulceration, lesions, rashes, or cyanosis. Skin warm and dry. Turgor intact.  PSYCHIATRIC: Mood, affect within normal limits. The patient is awake, alert and oriented x 3. Insight, judgment intact.       IMAGING    CT CHEST W CONTRAST  Result Date: 05/08/2021 CLINICAL DATA:  86 year old female with history of pneumonia noted on recent chest x-ray. COVID positive patient. Evaluate for pneumonia versus interstitial lung disease. History of Sjogren's disease and cutaneous lupus. EXAM: CT CHEST WITH CONTRAST TECHNIQUE: Multidetector CT imaging of the chest was performed during intravenous contrast administration. CONTRAST:  28m OMNIPAQUE IOHEXOL 350 MG/ML SOLN COMPARISON:  Chest CT 05/05/2021. FINDINGS: Cardiovascular: Heart size is normal. There is no significant pericardial fluid, thickening or pericardial calcification. There is aortic atherosclerosis, as well as atherosclerosis of the great vessels of the mediastinum and the coronary arteries, including calcified atherosclerotic plaque in the left anterior descending and right coronary arteries. Calcifications of the mitral annulus. Mediastinum/Nodes: Multiple prominent borderline enlarged mediastinal lymph nodes are noted, nonspecific and likely reactive. No definite pathologically enlarged mediastinal or hilar lymph nodes. Small hiatal hernia. No axillary lymphadenopathy. Lungs/Pleura: Widespread but patchy areas of ground-glass attenuation, interlobular septal thickening and thickening of the peribronchovascular interstitium are noted  throughout all aspects of the lungs bilaterally, indicative of severe multilobar bilateral bronchopneumonia, presumably secondary to reported COVID infection. Trace bilateral pleural effusions layer dependently. Upper Abdomen: Aortic atherosclerosis.  Status post cholecystectomy. Musculoskeletal: Status post left modified radical mastectomy. Chronic compression fracture of L1 with 25% loss of anterior vertebral body height and post vertebroplasty changes. There are no aggressive appearing lytic or blastic lesions noted in the visualized portions of the skeleton. IMPRESSION: 1. Severe multilobar bilateral bronchopneumonia, presumably secondary to reported COVID infection. 2. Trace bilateral pleural effusions. 3. Small hiatal hernia. 4. Aortic atherosclerosis, in addition to 2 vessel coronary artery disease. Aortic Atherosclerosis (ICD10-I70.0). Electronically Signed   By: DVinnie LangtonM.D.   On: 05/08/2021 16:15   DG Chest Port 1 View  Result Date: 05/10/2021 CLINICAL DATA:  Abnormal chest x-ray. EXAM: PORTABLE CHEST 1 VIEW COMPARISON:  Radiograph 05/05/2021.  CT 05/08/2021 FINDINGS: Worsening lung aeration with progressive patchy opacities involving the right greater than left lung. Small pleural effusions have increased. Heart is normal in size, stable mediastinal contours. No pneumothorax. Again seen eventration of right hemidiaphragm. IMPRESSION: Worsening lung aeration with progressive patchy opacities involving the right greater than left lung. Findings may favor multifocal pneumonia over pulmonary edema. Small bilateral pleural effusions. Electronically Signed   By: MKeith RakeM.D.   On: 05/10/2021 17:25   DG Chest Portable 1 View  Result Date: 05/05/2021 CLINICAL DATA:  Weakness, COVID positive EXAM: PORTABLE CHEST 1 VIEW COMPARISON:  02/09/2018 FINDINGS: Mild patchy opacities in the right upper lobe and right mid lung. Mild left basilar opacity, possibly atelectasis. No pleural effusion or  pneumothorax. Partial eventration of the right hemidiaphragm. The heart is normal in size. Suspected healed right lateral clavicle fracture. IMPRESSION: Mild patchy opacities in the right lung, compatible with pneumonia in this patient with known COVID. Electronically Signed   By: SJulian HyM.D.   On: 05/05/2021 21:22   ECHOCARDIOGRAM COMPLETE  Result Date:  05/10/2021    ECHOCARDIOGRAM REPORT   Patient Name:   Kim Santiago Date of Exam: 05/10/2021 Medical Rec #:  [id number]        Height:       63.0 in Accession #:    [id number]       Weight:       143.3 lb Date of Birth:  04-May-1935        BSA:          1.678 m Patient Age:    89 years         BP:           169/79 mmHg Patient Gender: F                HR:           99 bpm. Exam Location:  [facility] Procedure: 2D Echo and Strain Analysis Indications:     CHF Acute Systolic [id number]  History:         Patient has prior history of Echocardiogram examinations, most                  recent 11/08/2018.  Sonographer:     Kathlen Brunswick RDCS Referring Phys:  [id number] Ottie Glazier Diagnosing Phys: Kate Sable MD  Sonographer Comments: Global longitudinal strain was attempted. IMPRESSIONS  1. Left ventricular ejection fraction, by estimation, is 55 to 60%. The left ventricle has normal function. The left ventricle has no regional wall motion abnormalities. Left ventricular diastolic parameters were normal.  2. Right ventricular systolic function is normal. The right ventricular size is normal.  3. The mitral valve is normal in structure. No evidence of mitral valve regurgitation.  4. The aortic valve is tricuspid. Aortic valve regurgitation is not visualized.  5. The inferior vena cava is normal in size with greater than 50% respiratory variability, suggesting right atrial pressure of 3 mmHg. FINDINGS  Left Ventricle: Left ventricular ejection fraction, by estimation, is 55 to 60%. The left ventricle has normal function. The left ventricle has no regional wall  motion abnormalities. Global longitudinal strain performed but not reported based on interpreter judgement due to suboptimal tracking. The left ventricular internal cavity size was normal in size. There is no left ventricular hypertrophy. Left ventricular diastolic parameters were normal. Right Ventricle: The right ventricular size is normal. No increase in right ventricular wall thickness. Right ventricular systolic function is normal. Left Atrium: Left atrial size was normal in size. Right Atrium: Right atrial size was normal in size. Pericardium: There is no evidence of pericardial effusion. Mitral Valve: The mitral valve is normal in structure. No evidence of mitral valve regurgitation. Tricuspid Valve: The tricuspid valve is normal in structure. Tricuspid valve regurgitation is mild. Aortic Valve: The aortic valve is tricuspid. Aortic valve regurgitation is not visualized. Aortic valve peak gradient measures 6.8 mmHg. Pulmonic Valve: The pulmonic valve was normal in structure. Pulmonic valve regurgitation is trivial. Aorta: The aortic root and ascending aorta are structurally normal, with no evidence of dilitation. Venous: The inferior vena cava is normal in size with greater than 50% respiratory variability, suggesting right atrial pressure of 3 mmHg. IAS/Shunts: No atrial level shunt detected by color flow Doppler.  LEFT VENTRICLE PLAX 2D LVIDd:         3.50 cm  Diastology LVIDs:         2.50 cm  LV e' medial:    14.10 cm/s LV PW:         1.00  cm  LV E/e' medial:  7.6 LV IVS:        1.00 cm  LV e' lateral:   15.30 cm/s LVOT diam:     1.90 cm  LV E/e' lateral: 7.0 LV SV:         53 LV SV Index:   32 LVOT Area:     2.84 cm  RIGHT VENTRICLE RV Basal diam:  2.90 cm RV S prime:     15.10 cm/s TAPSE (M-mode): 2.1 cm LEFT ATRIUM             Index       RIGHT ATRIUM          Index LA diam:        3.00 cm 1.79 cm/m  RA Area:     9.68 cm LA Vol (A2C):   22.6 ml 13.47 ml/m RA Volume:   20.40 ml 12.16 ml/m LA Vol  (A4C):   20.6 ml 12.28 ml/m LA Biplane Vol: 22.5 ml 13.41 ml/m  AORTIC VALVE                PULMONIC VALVE AV Area (Vmax): 2.11 cm    PV Vmax:       0.95 m/s AV Vmax:        130.00 cm/s PV Peak grad:  3.6 mmHg AV Peak Grad:   6.8 mmHg LVOT Vmax:      96.60 cm/s LVOT Vmean:     59.600 cm/s LVOT VTI:       0.187 m  AORTA Ao Root diam: 2.90 cm Ao Asc diam:  3.20 cm MITRAL VALVE                TRICUSPID VALVE MV Area (PHT): 5.16 cm     TV Peak grad:   27.1 mmHg MV Decel Time: 147 msec     TV Vmax:        2.61 m/s MV E velocity: 107.00 cm/s MV A velocity: 33.40 cm/s   SHUNTS MV E/A ratio:  3.20         Systemic VTI:  0.19 m                             Systemic Diam: 1.90 cm Kate Sable MD Electronically signed by Kate Sable MD Signature Date/Time: 05/10/2021/3:32:19 PM    Final       ASSESSMENT/PLAN   Acute hypoxemic respiratory failure -Patient had COVID-19 4 weeks ago.  Due to relative immunocompromise state with methotrexate she is at high risk for long COVID syndrome, additionally cycle threshold value is 30.3 indicative of active replication of virus and ongoing infection.  Recommendation for repeated treatment and consideration of high titer convalescent plasma available for these cases -Agree with Fungitell testing as patient is at increased risk for fungal infection -Review of CT chest above does show bilateral pulmonary congestion with edema and atelectasis worse at the left lower lobe.  Patient has history of CHF with previous echo showing EF of 35 as well as another echo showing EF of 65 with diastolic dysfunction.  We will repeat transthoracic echo as well as obtain BNP today.  Patient may benefit from diuresis if possible. -Discussed with ID and placed consultation for High titer convalescent plasma -continue current therapy with solumedrol and Remdesevir.  PT/OT   Bibasilar atelectasis -upgrade recruitment to metaNEB with albuterol BID with RT -may not be able to do due to  COVID  precautions but can attempt VEST -continue IS and flutter      Chronic systolic and diastolic CHF-RESOLVED  S/P TTE 05/09/21- NORMAL   - patient with 1+ pedal edema   -will repeat TTE and BNP today  -    Thank you for allowing me to participate in the care of this patient.    Patient/Family are satisfied with care plan and all questions have been answered.  This document was prepared using Dragon voice recognition software and may include unintentional dictation errors.     Ottie Glazier, M.D.  Division of [facility]

## 2021-05-11 NOTE — Progress Notes (Signed)
Physical Therapy Treatment Patient Details Name: Kim Santiago MRN: [id number] DOB: 23-Dec-1934 Today's Date: 05/11/2021    History of Present Illness 86 y.o.female with medical history significant for hypertension, hypothyroidism, cutaneous lupus, coronary artery disease, Sjogren's syndrome and breast cancer status post left mastectomy, who presented to the ER with acute onset of significant generalized weakness.  Was diagnosed with COVID-19 about 4 weeks prior to admission.    PT Comments    Pt eager to work with PT and get up to recliner.  New 4WW in room and PT instructed on appropriate use and set to correct height.  Pt ultimately did very well and showed good awareness/safety using walker and on 2L O2 was able to maintain sats in the 90s during <100 ft of ambulation.    Follow Up Recommendations  Home health PT;Supervision - Intermittent     Equipment Recommendations   [id number])    Recommendations for Other Services       Precautions / Restrictions Precautions Precautions: Fall Restrictions Weight Bearing Restrictions: No    Mobility  Bed Mobility Overal bed mobility: Modified Independent             General bed mobility comments: Pt able to get herself to EOB w/o direct assist    Transfers Overall transfer level: Needs assistance Equipment used: 4-wheeled walker Transfers: Sit to/from Stand Sit to Stand: Min guard         General transfer comment: New 4WW: set height oriented (brakes, etc) required VC's for safe hand placement but no direct assist on multiple sit to stand efforts  Ambulation/Gait Ambulation/Gait assistance: Min guard Gait Distance (Feet): 125 Feet Assistive device: 4-wheeled walker       General Gait Details: 8x loops in Covid room.  On 2L t/o the effort with sats staying in the 90s and no overt LOBs, DOE or overt safety issues.  Pt showed good relative confidence and overall did well, better than she had expected.   Stairs              Wheelchair Mobility    Modified Rankin (Stroke Patients Only)       Balance Overall balance assessment: Needs assistance Sitting-balance support: No upper extremity supported Sitting balance-Leahy Scale: Good     Standing balance support: Bilateral upper extremity supported;During functional activity Standing balance-Leahy Scale: Good Standing balance comment: Pt using rollator (does not use AD at baseline) but with no LOBs and good general control/awareness with new AD                            Cognition Arousal/Alertness: Awake/alert Behavior During Therapy: WFL for tasks assessed/performed Overall Cognitive Status: Within Functional Limits for tasks assessed                                        Exercises      General Comments General comments (skin integrity, edema, etc.): Pt was initially unsure she would be able to do a lot, but ultimately she walked well over 100 ft with good safety, confidence and minimal fatigue      Pertinent Vitals/Pain Pain Assessment: No/denies pain    Home Living                      Prior Function  PT Goals (current goals can now be found in the care plan section) Progress towards PT goals: Progressing toward goals    Frequency    Min 2X/week      PT Plan Current plan remains appropriate    Co-evaluation              AM-PAC PT "6 Clicks" Mobility   Outcome Measure  Help needed turning from your back to your side while in a flat bed without using bedrails?: None Help needed moving from lying on your back to sitting on the side of a flat bed without using bedrails?: None Help needed moving to and from a bed to a chair (including a wheelchair)?: A Little Help needed standing up from a chair using your arms (e.g., wheelchair or bedside chair)?: A Little Help needed to walk in hospital room?: A Little Help needed climbing 3-5 steps with a railing? : A Little 6  Click Score: 20    End of Session Equipment Utilized During Treatment: Oxygen Activity Tolerance: Patient tolerated treatment well Patient left: in chair;with call bell/phone within reach;with family/visitor present Nurse Communication: Mobility status PT Visit Diagnosis: Muscle weakness (generalized) (M62.81);Difficulty in walking, not elsewhere classified (R26.2);Unsteadiness on feet (R26.81)     Time: [id number] PT Time Calculation (min) (ACUTE ONLY): 27 min  Charges:  $Gait Training: 8-22 mins $Therapeutic Activity: 8-22 mins                     Kreg Shropshire, DPT 05/11/2021, 2:04 PM

## 2021-05-11 NOTE — Progress Notes (Signed)
[facility] PROGRESS NOTE    Kim Santiago  ZHG:[id number] DOB: [birth year] DOA: 05/05/2021 PCP: [name], MD      Brief Narrative:  Kim Santiago is a 86 y.o. 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  In the ER, HR 106, and CXR showed patchy pneumonia.  Admitted for treatment of pneumonia.         Assessment & Plan:  Pneumonia Suspected COVID pneumonia See summary from 9/2  Crypto antigen negative.   AM cortisol normal. Seems better today, no fever last 24 hours  -Continue remdesivir, steroids - Consult Pulmonlogy, ID  - Follow sputum, fungitell, histo, aspergillus Ag, CMV, fungal antibodies, COVID Ab profile - Hold MTX - Continue flutter and incentive spirometer     Acute respiratory failure with hypoxia Patient presented with respiratory rates 21 to 33 over the first 2 days in the hospita, now persistently requiring 2L O2 Still on O2  Acute on chronic macrocytic anemia Chronic anemia ~10 g/dL   Here, acutely worse, down to 6.9 g/dL on 9/2.  Transfused 1 unit that day, posttransfusion hemoglobin today up and stable today from yesterday No hemolysis or bleeding B12 and folate normal, TSH normal - Hold methotrexate - Trend CBC - Consult hematology, appreciate cares - Continue Folate    Leukopenia - Consult hematology, they recommend watchful waiting, biopsy if worsening over weekend  Hyponatremia Stable, asymptomatic  Cutaneous lupus - Hold methotrexate  History of breast cancer  Hypothyroidism - Continue levothyroxine  Hypertension Coronary disease Blood pressure control - Continue atorvastatin, losartan, metoprolol - Hold Lasix    Depression - Continue sertraline and trazodone  GERD - Continue PPI            Disposition: Status is: Inpatient  Remains inpatient  appropriate because:Inpatient level of care appropriate due to severity of illness and ongoing work up requiring inpatient care  Dispo: The patient is from: Home              Anticipated d/c is to: SNF              Patient currently is not medically stable to d/c.   Difficult to place patient No   Level of care: Med-Surg             MDM: The below labs and imaging reports were reviewed and summarized above.  Medication management as above.    DVT prophylaxis: enoxaparin (LOVENOX) injection 40 mg Start: 05/05/21 2330  Code Status: DNR Family Communication: Daughter at the bedside             Subjective: No fever since starting remdesivir and steroids.  Cough still present, no malaise.  Still tired.       Objective: Vitals:   05/10/21 1600 05/10/21 2208 05/11/21 0537 05/11/21 0820  BP: (!) 151/78 (!) 126/94 136/64 (!) 155/91  Pulse: 100 (!) 102 90 (!) 104  Resp: _0 Temp: 98.5 F (36.9 C) 98.7 F (37.1 C) (!) 97.5 F (36.4 C) 97.8 F (36.6 C)  TempSrc:      SpO2: 98% 98% 95% 100%  Weight:      Height:        Intake/Output Summary (Last 24 hours) at 05/11/2021 1055 Last data filed at 05/11/2021 0400 Gross per 24 hour  Intake 100 ml  Output --  Net 100 ml  Filed Weights   05/05/21 1731  Weight: 65 kg    Examination: General appearance: Elderly female, lying in bed, no acute distress, appears tired     HEENT:    Skin:  Cardiac: RRR, no murmurs, no lower extremity edema Respiratory: Rales in the right, nothing on the left, no wheezing, respiratory effort appears normal, nasal cannula in place Abdomen: Abdomen soft no tenderness palpation or guarding, no ascites or distention MSK:  Neuro: Awake and alert, extraocular movements intact, moves upper extremities with generalized weakness, speech fluent Psych: Sleepy, attention normal, affect judgment appear normal      Data Reviewed: I have personally reviewed following labs and  imaging studies:  CBC: Recent Labs  Lab 05/08/21 0436 05/09/21 1201 05/10/21 0620 05/10/21 1849 05/11/21 0511  WBC 2.3* 2.8* 3.2* 3.1* 3.0*  NEUTROABS  --   --   --  2.4  --   HGB 7.1* 6.9* 8.6* 9.4* 8.8*  HCT 20.2* 19.4* 23.8* 26.5* 24.6*  MCV 105.2* 107.2* 101.3* 99.3 100.0  PLT 137* 153 140* 140* 093*   Basic Metabolic Panel: Recent Labs  Lab 05/07/21 0551 05/08/21 0436 05/09/21 0740 05/10/21 0620 05/11/21 0511  NA 130* 130* 137 134* 133*  K 3.9 4.1 4.7 4.1 3.8  CL 99 100 103 103 97*  CO2 _0 GLUCOSE 94 103* 176* 133* 133*  BUN 15 17 41* 12 23  CREATININE 0.67 0.82 1.06* 0.65 0.75  CALCIUM 7.6* 7.7* 10.3 8.0* 8.4*  MG 1.9  --   --   --   --    GFR: Estimated Creatinine Clearance: 47.4 mL/min (by C-G formula based on SCr of 0.75 mg/dL). Liver Function Tests: Recent Labs  Lab 05/08/21 0436 05/11/21 0511  AST 22 23  ALT 8 8  ALKPHOS 37* 49  BILITOT 0.6 0.7  PROT 4.6* 5.0*  ALBUMIN 2.2* 2.3*   No results for input(s): LIPASE, AMYLASE in the last 168 hours. No results for input(s): AMMONIA in the last 168 hours. Coagulation Profile: No results for input(s): INR, PROTIME in the last 168 hours. Cardiac Enzymes: No results for input(s): CKTOTAL, CKMB, CKMBINDEX, TROPONINI in the last 168 hours. BNP (last 3 results) No results for input(s): PROBNP in the last 8760 hours. HbA1C: No results for input(s): HGBA1C in the last 72 hours. CBG: No results for input(s): GLUCAP in the last 168 hours. Lipid Profile: No results for input(s): CHOL, HDL, LDLCALC, TRIG, CHOLHDL, LDLDIRECT in the last 72 hours. Thyroid Function Tests: Recent Labs    05/09/21 0740  TSH 1.630   Anemia Panel: Recent Labs    05/09/21 0740  VITAMINB12 4,824*  FOLATE 17.5   Urine analysis:    Component Value Date/Time   COLORURINE YELLOW (A) 05/08/2021 1810   APPEARANCEUR CLEAR (A) 05/08/2021 1810   LABSPEC 1.044 (H) 05/08/2021 1810   PHURINE 5.0 05/08/2021 1810    GLUCOSEU NEGATIVE 05/08/2021 1810   GLUCOSEU NEGATIVE 05/11/2017 1143   HGBUR NEGATIVE 05/08/2021 1810   BILIRUBINUR NEGATIVE 05/08/2021 1810   BILIRUBINUR negative 07/04/2018 1134   KETONESUR 5 (A) 05/08/2021 1810   PROTEINUR NEGATIVE 05/08/2021 1810   UROBILINOGEN 0.2 07/04/2018 1134   UROBILINOGEN 0.2 05/11/2017 1143   NITRITE NEGATIVE 05/08/2021 1810   LEUKOCYTESUR NEGATIVE 05/08/2021 1810   Sepsis Labs: _1 (procalcitonin:4,lacticacidven:4)  ) Recent Results (from the past 240 hour(s))  Resp Panel by RT-PCR (Flu A&B, Covid) Nasopharyngeal Swab     Status: Abnormal   Collection Time: 05/05/21  9:49 PM   Specimen: Nasopharyngeal Swab; Nasopharyngeal(NP) swabs in vial transport medium  Result Value Ref Range Status   SARS Coronavirus 2 by RT PCR POSITIVE (A) NEGATIVE Final    Comment: RESULT CALLED TO, READ BACK BY AND VERIFIED WITH: SANTIA [email or web address]  05/05/21 RH (NOTE) SARS-CoV-2 target nucleic acids are DETECTED.  The SARS-CoV-2 RNA is generally detectable in upper respiratory specimens during the acute phase of infection. Positive results are indicative of the presence of the identified virus, but do not rule out bacterial infection or co-infection with other pathogens not detected by the test. Clinical correlation with patient history and other diagnostic information is necessary to determine patient infection status. The expected result is Negative.  Fact Sheet for Patients: EntrepreneurPulse.com.au  Fact Sheet for Healthcare Providers: IncredibleEmployment.be  This test is not yet approved or cleared by the [place] FDA and  has been authorized for detection and/or diagnosis of SARS-CoV-2 by FDA under an Emergency Use Authorization (EUA).  This EUA will remain in effect (meaning this test can be [place] ed) for the duration of  the COVID-19 declaration under Section 564(b)(1) of the Act, 21 U.S.C. section 360bbb-3(b)(1),  unless the authorization is terminated or revoked sooner.     Influenza A by PCR NEGATIVE NEGATIVE Final   Influenza B by PCR NEGATIVE NEGATIVE Final    Comment: (NOTE) The Xpert Xpress SARS-CoV-2/FLU/RSV plus assay is intended as an aid in the diagnosis of influenza from Nasopharyngeal swab specimens and should not be used as a sole basis for treatment. Nasal washings and aspirates are unacceptable for Xpert Xpress SARS-CoV-2/FLU/RSV testing.  Fact Sheet for Patients: EntrepreneurPulse.com.au  Fact Sheet for Healthcare Providers: IncredibleEmployment.be  This test is not yet approved or cleared by the [place] FDA and has been authorized for detection and/or diagnosis of SARS-CoV-2 by FDA under an Emergency Use Authorization (EUA). This EUA will remain in effect (meaning this test can be used) for the duration of the COVID-19 declaration under Section 564(b)(1) of the Act, 21 U.S.C. section 360bbb-3(b)(1), unless the authorization is terminated or revoked.  Performed at [facility], [address] [place], [place] [address]   Blood culture (routine x 2)     Status: None   Collection Time: 05/05/21 10:18 PM   Specimen: BLOOD  Result Value Ref Range Status   Specimen Description BLOOD RIGHT HAND  Final   Special Requests   Final    BOTTLES DRAWN AEROBIC AND ANAEROBIC Blood Culture adequate volume   Culture   Final    NO GROWTH 5 DAYS Performed at [facility], [address] [place], [place] [address]    Report Status 05/10/2021 FINAL  Final  Blood culture (routine x 2)     Status: None   Collection Time: 05/05/21 11:17 PM   Specimen: BLOOD  Result Value Ref Range Status   Specimen Description BLOOD RIGHT ARM  Final   Special Requests   Final    BOTTLES DRAWN AEROBIC AND ANAEROBIC Blood Culture adequate volume   Culture   Final    NO GROWTH 5 DAYS Performed at [facility], [address]  [place], [place] [address]    Report Status 05/10/2021 FINAL  Final  MRSA Next Gen by PCR, Nasal     Status: None   Collection Time: 05/07/21  9:16 AM   Specimen: Nasal Mucosa; Nasal Swab  Result Value Ref Range Status   MRSA by PCR Next Gen NOT DETECTED NOT DETECTED Final  Comment: (NOTE) The GeneXpert MRSA Assay (FDA approved for NASAL specimens only), is one component of a comprehensive MRSA colonization surveillance program. It is not intended to diagnose MRSA infection nor to guide or monitor treatment for MRSA infections. Test performance is not FDA approved in patients less than 54 years old. Performed at [facility], [place], [place] [address]   Expectorated Sputum Assessment w Gram Stain, Rflx to Resp Cult     Status: None   Collection Time: 05/07/21  1:18 PM   Specimen: Sputum  Result Value Ref Range Status   Specimen Description SPUTUM  Final   Special Requests NONE  Final   Sputum evaluation   Final    Sputum specimen not acceptable for testing.  Please recollect.   RESULT CALLED TO, READ BACK BY AND VERIFIED WITH: Pat Patrick 05/07/21 1411 KLW Performed at [facility], [address] [place], [place] [address]    Report Status 05/07/2021 FINAL  Final  Respiratory (~20 pathogens) panel by PCR     Status: None   Collection Time: 05/07/21  4:59 PM   Specimen: Nasopharyngeal Swab; Respiratory  Result Value Ref Range Status   Adenovirus NOT DETECTED NOT DETECTED Final   Coronavirus 229E NOT DETECTED NOT DETECTED Final    Comment: (NOTE) The Coronavirus on the Respiratory Panel, DOES NOT test for the novel  Coronavirus (2019 nCoV)    Coronavirus HKU1 NOT DETECTED NOT DETECTED Final   Coronavirus NL63 NOT DETECTED NOT DETECTED Final   Coronavirus OC43 NOT DETECTED NOT DETECTED Final   Metapneumovirus NOT DETECTED NOT DETECTED Final   Rhinovirus / Enterovirus NOT DETECTED NOT DETECTED Final   Influenza A NOT DETECTED NOT DETECTED  Final   Influenza B NOT DETECTED NOT DETECTED Final   Parainfluenza Virus 1 NOT DETECTED NOT DETECTED Final   Parainfluenza Virus 2 NOT DETECTED NOT DETECTED Final   Parainfluenza Virus 3 NOT DETECTED NOT DETECTED Final   Parainfluenza Virus 4 NOT DETECTED NOT DETECTED Final   Respiratory Syncytial Virus NOT DETECTED NOT DETECTED Final   Bordetella pertussis NOT DETECTED NOT DETECTED Final   Bordetella Parapertussis NOT DETECTED NOT DETECTED Final   Chlamydophila pneumoniae NOT DETECTED NOT DETECTED Final   Mycoplasma pneumoniae NOT DETECTED NOT DETECTED Final    Comment: Performed at [facility], [address], [place], [place] [address]  Expectorated Sputum Assessment w Gram Stain, Rflx to Resp Cult     Status: None   Collection Time: 05/07/21  7:19 PM   Specimen: Expectorated Sputum  Result Value Ref Range Status   Specimen Description EXPECTORATED SPUTUM  Final   Special Requests NONE  Final   Sputum evaluation   Final    THIS SPECIMEN IS ACCEPTABLE FOR SPUTUM CULTURE Performed at [facility], [address], [place], [place] [address]    Report Status 05/07/2021 FINAL  Final  Culture, Respiratory w Gram Stain     Status: None   Collection Time: 05/07/21  7:19 PM  Result Value Ref Range Status   Specimen Description   Final    EXPECTORATED SPUTUM Performed at [facility], [address] [place], [place] [address]    Special Requests   Final    NONE Reflexed from [id number] Performed at [facility], [address] [place], [place] [address]    Gram Stain   Final    NO WBC SEEN RARE GRAM POSITIVE COCCI IN PAIRS RARE GRAM POSITIVE RODS RARE  BUDDING YEAST SEEN Performed at [facility], [place] [address] [place], [place] [address]    Culture RARE CANDIDA ALBICANS  Final   Report Status 05/10/2021 FINAL  Final         Radiology Studies: DG Chest Port 1 View  Result Date: 05/10/2021 CLINICAL DATA:  Abnormal chest x-ray. EXAM:  PORTABLE CHEST 1 VIEW COMPARISON:  Radiograph 05/05/2021.  CT 05/08/2021 FINDINGS: Worsening lung aeration with progressive patchy opacities involving the right greater than left lung. Small pleural effusions have increased. Heart is normal in size, stable mediastinal contours. No pneumothorax. Again seen eventration of right hemidiaphragm. IMPRESSION: Worsening lung aeration with progressive patchy opacities involving the right greater than left lung. Findings may favor multifocal pneumonia over pulmonary edema. Small bilateral pleural effusions. Electronically Signed   By: Keith Rake M.D.   On: 05/10/2021 17:25   ECHOCARDIOGRAM COMPLETE  Result Date: 05/10/2021    ECHOCARDIOGRAM REPORT   Patient Name:   Kim Santiago Date of Exam: 05/10/2021 Medical Rec #:  [id number]        Height:       63.0 in Accession #:    [id number]       Weight:       143.3 lb Date of Birth:  13-Feb-1935        BSA:          1.678 m Patient Age:    30 years         BP:           169/79 mmHg Patient Gender: F                HR:           99 bpm. Exam Location:  [facility] Procedure: 2D Echo and Strain Analysis Indications:     CHF Acute Systolic K74.25  History:         Patient has prior history of Echocardiogram examinations, most                  recent 11/08/2018.  Sonographer:     Kathlen Brunswick RDCS Referring Phys:  [id number] Ottie Glazier Diagnosing Phys: Kate Sable MD  Sonographer Comments: Global longitudinal strain was attempted. IMPRESSIONS  1. Left ventricular ejection fraction, by estimation, is 55 to 60%. The left ventricle has normal function. The left ventricle has no regional wall motion abnormalities. Left ventricular diastolic parameters were normal.  2. Right ventricular systolic function is normal. The right ventricular size is normal.  3. The mitral valve is normal in structure. No evidence of mitral valve regurgitation.  4. The aortic valve is tricuspid. Aortic valve regurgitation is not visualized.  5. The  inferior vena cava is normal in size with greater than 50% respiratory variability, suggesting right atrial pressure of 3 mmHg. FINDINGS  Left Ventricle: Left ventricular ejection fraction, by estimation, is 55 to 60%. The left ventricle has normal function. The left ventricle has no regional wall motion abnormalities. Global longitudinal strain performed but not reported based on interpreter judgement due to suboptimal tracking. The left ventricular internal cavity size was normal in size. There is no left ventricular hypertrophy. Left ventricular diastolic parameters were normal. Right Ventricle: The right ventricular size is normal. No increase in right ventricular wall thickness. Right ventricular systolic function is normal. Left Atrium: Left atrial size was normal in size. Right Atrium: Right atrial size was normal in size. Pericardium: There is no evidence of pericardial effusion. Mitral Valve: The mitral  valve is normal in structure. No evidence of mitral valve regurgitation. Tricuspid Valve: The tricuspid valve is normal in structure. Tricuspid valve regurgitation is mild. Aortic Valve: The aortic valve is tricuspid. Aortic valve regurgitation is not visualized. Aortic valve peak gradient measures 6.8 mmHg. Pulmonic Valve: The pulmonic valve was normal in structure. Pulmonic valve regurgitation is trivial. Aorta: The aortic root and ascending aorta are structurally normal, with no evidence of dilitation. Venous: The inferior vena cava is normal in size with greater than 50% respiratory variability, suggesting right atrial pressure of 3 mmHg. IAS/Shunts: No atrial level shunt detected by color flow Doppler.  LEFT VENTRICLE PLAX 2D LVIDd:         3.50 cm  Diastology LVIDs:         2.50 cm  LV e' medial:    14.10 cm/s LV PW:         1.00 cm  LV E/e' medial:  7.6 LV IVS:        1.00 cm  LV e' lateral:   15.30 cm/s LVOT diam:     1.90 cm  LV E/e' lateral: 7.0 LV SV:         53 LV SV Index:   32 LVOT Area:      2.84 cm  RIGHT VENTRICLE RV Basal diam:  2.90 cm RV S prime:     15.10 cm/s TAPSE (M-mode): 2.1 cm LEFT ATRIUM             Index       RIGHT ATRIUM          Index LA diam:        3.00 cm 1.79 cm/m  RA Area:     9.68 cm LA Vol (A2C):   22.6 ml 13.47 ml/m RA Volume:   20.40 ml 12.16 ml/m LA Vol (A4C):   20.6 ml 12.28 ml/m LA Biplane Vol: 22.5 ml 13.41 ml/m  AORTIC VALVE                PULMONIC VALVE AV Area (Vmax): 2.11 cm    PV Vmax:       0.95 m/s AV Vmax:        130.00 cm/s PV Peak grad:  3.6 mmHg AV Peak Grad:   6.8 mmHg LVOT Vmax:      96.60 cm/s LVOT Vmean:     59.600 cm/s LVOT VTI:       0.187 m  AORTA Ao Root diam: 2.90 cm Ao Asc diam:  3.20 cm MITRAL VALVE                TRICUSPID VALVE MV Area (PHT): 5.16 cm     TV Peak grad:   27.1 mmHg MV Decel Time: 147 msec     TV Vmax:        2.61 m/s MV E velocity: 107.00 cm/s MV A velocity: 33.40 cm/s   SHUNTS MV E/A ratio:  3.20         Systemic VTI:  0.19 m                             Systemic Diam: 1.90 cm Kate Sable MD Electronically signed by Kate Sable MD Signature Date/Time: 05/10/2021/3:32:19 PM    Final         Scheduled Meds:  acidophilus  1 capsule Oral Daily   atorvastatin  20 mg Oral Daily   calcium-vitamin D  2 tablet Oral  Daily   cholecalciferol  2,000 Units Oral Daily   enoxaparin (LOVENOX) injection  40 mg Subcutaneous I35W   folic acid  1 mg Oral Daily   furosemide  20 mg Intravenous BID   guaiFENesin  600 mg Oral BID   levothyroxine  75 mcg Oral Q0600   losartan  25 mg Oral Daily   methylPREDNISolone (SOLU-MEDROL) injection  40 mg Intravenous Daily   metoprolol succinate  25 mg Oral Daily   pantoprazole  40 mg Oral Daily   polyethylene glycol  17 g Oral Daily   sertraline  25 mg Oral Daily   traZODone  50 mg Oral QHS   Continuous Infusions:  remdesivir 100 mg in NS 100 mL 100 mg (05/11/21 0929)     LOS: 6 days    Time spent: 25 minutes    Edwin Dada, MD [facility] 05/11/2021,  10:55 AM     Please page though [place] or Epic secure chat:  For [employer], Adult nurse

## 2021-05-12 DIAGNOSIS — J1282 Pneumonia due to coronavirus disease 2019: Secondary | ICD-10-CM | POA: Diagnosis not present

## 2021-05-12 DIAGNOSIS — U071 COVID-19: Secondary | ICD-10-CM | POA: Diagnosis not present

## 2021-05-12 LAB — CBC
HCT: 26.3 % — ABNORMAL LOW (ref 36.0–46.0)
Hemoglobin: 9.2 g/dL — ABNORMAL LOW (ref 12.0–15.0)
MCH: 34.5 pg — ABNORMAL HIGH (ref 26.0–34.0)
MCHC: 35 g/dL (ref 30.0–36.0)
MCV: 98.5 fL (ref 80.0–100.0)
Platelets: 171 10*3/uL (ref 150–400)
RBC: 2.67 MIL/uL — ABNORMAL LOW (ref 3.87–5.11)
RDW: 18.4 % — ABNORMAL HIGH (ref 11.5–15.5)
WBC: 4.3 10*3/uL (ref 4.0–10.5)
nRBC: 0 % (ref 0.0–0.2)

## 2021-05-12 LAB — C-REACTIVE PROTEIN: CRP: 7.4 mg/dL — ABNORMAL HIGH (ref <1.0)

## 2021-05-12 MED ORDER — ALBUTEROL SULFATE HFA 108 (90 BASE) MCG/ACT IN AERS
2.0000 | INHALATION_SPRAY | Freq: Four times a day (QID) | RESPIRATORY_TRACT | Status: DC
  Administered 2021-05-12 – 2021-05-13 (×4): 2 via RESPIRATORY_TRACT
  Filled 2021-05-12: qty 6.7

## 2021-05-12 NOTE — Progress Notes (Signed)
Pulmonary Medicine          Date: 05/12/2021,   MRN# [id number] Kim Santiago 08-Aug-1935     AdmissionWeight: 65 kg                 CurrentWeight: 65 kg   Referring physician: Dr. Loleta Books   CHIEF COMPLAINT:   Refractory pneumonia with with complex comorbid history   HISTORY OF PRESENT ILLNESS   This is a pleasant 86 year old female she has a history of osteoarthritis, breast cancer with left mastectomy in 2017 as well as lymph node resection, chronic kidney disease, history of MI, essential hypertension, hypothyroidism post thyroidectomy due to thyroid cancer, has a history of lupus with Sjogren's overlap, she is postmenopausal, she does have osteoarthritis, systolic heart failure with ejection fraction of 35%, who initially came into the emergency room with complaints of malaise post COVID-19 4 weeks ago.  She does have residual cough and states that intermittent expectoration of phlegm with yellow discoloration is ongoing with possible progression.  She denied GI symptoms specifically nausea vomiting and diarrhea was absent, she denied chest pain falls, confusion, seizure activity, visual impairment.  She did have immunization with COVID-19 with a total of 3 doses.  She was tachycardic on arrival with hyponatremia at 129 mild lactic acidosis chronic anemia with a hemoglobin of 9 and had another COVID-19 test when she came in which was positive still from previous.  For her systemic inflammatory autoimmune disease she does take methotrexate 15 mg once weekly.  Respiratory cultures were done which are negative at this time, also patient had non-COVID respiratory viral panel with 20 organisms reviewed all negative.  There is serum Fungitell in process. Patient remains febrile. Procalcitonin was also done which was essentially normal.  PCCM consultation placed for additional evaluation management.   05/10/21- Patient evaluated at bedside. She had TTE which is essentially normal, BNP  also wnl so unlikely cardiac dysfunction playing a role here. We discussed gentle diuresis today and continued bronchopulmonary hyginene and PT as previous.  Will dc IVF today as patient does have mild pedal edema now and pulmonary congestion. Repeat CXR. Pathology PBF due to relative tricytopenia. [name] her daugher was present and shares patient has been weaker post CVA 2019. She would benefit from PT  05/11/21- patient reports clinical improvement.  She had loose stools with improvement today.  WE are weaning Oxygen therapy and patient is eating now. Urine output is not documented but wall suction container had 1Liter. Solumderol decreased to '20mg'$  today. CXR reviewed with multifocal pneumonia. Patient had no fever overnight. Continue lasix 20 bid  05/12/21- patient feels improved.  She did require O2 again but shares overall shes doing well and ambulated with PT in room yesterday. Plan for dc tommorow.    PAST MEDICAL HISTORY   Past Medical History:  Diagnosis Date   Arthritis    Breast cancer ([place]) 2017   left mastectomy done 11/2015   Breast cancer in female [facility]) 11/18/2015   Left: 3.9 cm tumor, T2, 1/2 sentinel nodes positive for macro metastatic disease, N1, 3 negative nodes in the axillary tail, ER+,PR+, Her 2 neu, low Mammoprint score   Cancer (HCC)    thyroid takes levothyroxine   Chronic kidney disease    UTI   Genetic screening March 2017.   Mammoprint of left breast cancer: Low risk for recurrence.    History of heart attack 06/12/2014   Hypertension    hypothyroidism    secondary  to thyroidectomy for thyroid ca   Hypothyroidism    Lupus ([place])    subcutaneous   Menopause 40s   natural, hot flashes and mood lability now gone, off prempro 7 months   Myocardial infarction [facility]) 2013   Osteoporosis    Osteopenia   Rosacea    Sjoegren syndrome    Stress-induced cardiomyopathy September of 2013   EF 35%. Peak troponin was 1.8.     SURGICAL HISTORY   Past Surgical History:   Procedure Laterality Date   BACK SURGERY     BREAST BIOPSY Left 10/30/15   positive, done in Dr. Dwyane Luo office   CARDIAC CATHETERIZATION  05/2012   [place]. No significant CAD. Ejection fraction of 35% due to stress-induced cardiomyopathy.   CHOLECYSTECTOMY     COLONOSCOPY     DILATION AND CURETTAGE OF UTERUS     KYPHOSIS SURGERY  Feb 2008   L1, Dr. Mauri Pole   LUMBAR DISC SURGERY     L4-L5   MASTECTOMY Left 11/18/2015   positive   SENTINEL NODE BIOPSY Left 11/18/2015   Procedure: SENTINEL NODE BIOPSY;  Surgeon: Robert Bellow, MD;  Location: [facility] ORS;  Service: General;  Laterality: Left;   SHOULDER ARTHROSCOPY  2004   Left, Dr. Jefm Bryant   SIMPLE MASTECTOMY WITH AXILLARY SENTINEL NODE BIOPSY Left 11/18/2015   Procedure: SIMPLE MASTECTOMY;  Surgeon: Robert Bellow, MD;  Location: [facility] ORS;  Service: General;  Laterality: Left;   SPINE SURGERY     L4-5 diskectomy   THYROIDECTOMY     Thyroid Cancer   TONSILLECTOMY     TUBAL LIGATION       FAMILY HISTORY   Family History  Problem Relation Age of Onset   Kidney disease Mother    Heart disease Mother    COPD Father    Cancer Father        esophageal   Kidney disease Sister    Cancer Brother 43       colon cancer (both brothers)   Heart attack Brother 3   Heart disease Brother    Breast cancer Neg Hx      SOCIAL HISTORY   Social History   Tobacco Use   Smoking status: Never   Smokeless tobacco: Never  Vaping Use   Vaping Use: Never used  Substance Use Topics   Alcohol use: No   Drug use: No     MEDICATIONS    Home Medication:    Current Medication:  Current Facility-Administered Medications:    acetaminophen (TYLENOL) tablet 650 mg, 650 mg, Oral, Q6H PRN, 650 mg at 05/09/21 1546 **OR** acetaminophen (TYLENOL) suppository 650 mg, 650 mg, Rectal, Q6H PRN, Mansy, Jan A, MD   acidophilus (RISAQUAD) capsule 1 capsule, 1 capsule, Oral, Daily, Mansy, Jan A, MD, 1 capsule at 05/11/21 0915   atorvastatin  (LIPITOR) tablet 20 mg, 20 mg, Oral, Daily, Mansy, Jan A, MD, 20 mg at 05/11/21 0915   calcium-vitamin D (OSCAL WITH D) 500-200 MG-UNIT per tablet 2 tablet, 2 tablet, Oral, Daily, Mansy, Jan A, MD, 2 tablet at 05/11/21 0915   cholecalciferol (VITAMIN D3) tablet 2,000 Units, 2,000 Units, Oral, Daily, Mansy, Jan A, MD, 2,000 Units at 05/11/21 0914   enoxaparin (LOVENOX) injection 40 mg, 40 mg, Subcutaneous, Q24H, Mansy, Jan A, MD, 40 mg at [id number]   folic acid (FOLVITE) tablet 1 mg, 1 mg, Oral, Daily, Mansy, Jan A, MD, 1 mg at 05/11/21 1243   furosemide (LASIX) injection 20 mg, 20  mg, Intravenous, BID, Ottie Glazier, MD, 20 mg at 05/11/21 1757   guaiFENesin (MUCINEX) 12 hr tablet 600 mg, 600 mg, Oral, BID, Mansy, Jan A, MD, 600 mg at 05/11/21 2134   ibuprofen (ADVIL) tablet 400 mg, 400 mg, Oral, Q6H PRN, Edwin Dada, MD, 400 mg at 05/09/21 1815   levothyroxine (SYNTHROID) tablet 75 mcg, 75 mcg, Oral, Q0600, Mansy, Jan A, MD, 75 mcg at 05/12/21 0550   losartan (COZAAR) tablet 25 mg, 25 mg, Oral, Daily, Mansy, Jan A, MD, 25 mg at 05/11/21 0915   magnesium hydroxide (MILK OF MAGNESIA) suspension 30 mL, 30 mL, Oral, Daily PRN, Mansy, Jan A, MD   methylPREDNISolone sodium succinate (SOLU-MEDROL) 40 mg/mL injection 20 mg, 20 mg, Intravenous, Daily, Lanney Gins, Romulo Okray, MD   metoprolol succinate (TOPROL-XL) 24 hr tablet 25 mg, 25 mg, Oral, Daily, Mansy, Jan A, MD, 25 mg at 05/11/21 0915   nitroGLYCERIN (NITROSTAT) SL tablet 0.4 mg, 0.4 mg, Sublingual, Q5 min PRN, Mansy, Jan A, MD   ondansetron (ZOFRAN) tablet 4 mg, 4 mg, Oral, Q6H PRN **OR** ondansetron (ZOFRAN) injection 4 mg, 4 mg, Intravenous, Q6H PRN, Mansy, Jan A, MD, 4 mg at 05/08/21 [id number]   pantoprazole (PROTONIX) EC tablet 40 mg, 40 mg, Oral, Daily, Mansy, Jan A, MD, 40 mg at 05/11/21 0915   polyethylene glycol (MIRALAX / GLYCOLAX) packet 17 g, 17 g, Oral, Daily, Mansy, Jan A, MD, 17 g at 05/09/21 1003   remdesivir 100 mg in sodium  chloride 0.9 % 100 mL IVPB, 100 mg, Intravenous, Daily, Danford, Suann Larry, MD, Stopped at 05/11/21 0959   sertraline (ZOLOFT) tablet 25 mg, 25 mg, Oral, Daily, Mansy, Jan A, MD, 25 mg at 05/11/21 0915   tiZANidine (ZANAFLEX) tablet 2 mg, 2 mg, Oral, TID PRN, Ezekiel Slocumb, DO   traZODone (DESYREL) tablet 25 mg, 25 mg, Oral, QHS PRN, Mansy, Jan A, MD   traZODone (DESYREL) tablet 50 mg, 50 mg, Oral, QHS, Mansy, Jan A, MD, 50 mg at 05/11/21 2134    ALLERGIES   Azathioprine, Hydroxychloroquine, Mycophenolate mofetil, Amoxicillin, Codeine, Naprosyn [naproxen], Orudis [ketoprofen], Sulfa antibiotics, and Sulfathiazole     REVIEW OF SYSTEMS    Review of Systems:  Gen:  Denies  fever, sweats, chills weigh loss  HEENT: Denies blurred vision, double vision, ear pain, eye pain, hearing loss, nose bleeds, sore throat Cardiac:  No dizziness, chest pain or heaviness, chest tightness,edema Resp:   Denies cough or sputum porduction, shortness of breath,wheezing, hemoptysis,  Gi: Denies swallowing difficulty, stomach pain, nausea or vomiting, diarrhea, constipation, bowel incontinence Gu:  Denies bladder incontinence, burning urine Ext:   Denies Joint pain, stiffness or swelling Skin: Denies  skin rash, easy bruising or bleeding or hives Endoc:  Denies polyuria, polydipsia , polyphagia or weight change Psych:   Denies depression, insomnia or hallucinations   Other:  All other systems negative   VS: BP (!) 140/55 (BP Location: Right Arm)   Pulse 90   Temp 98.1 F (36.7 C)   Resp 16   Ht '5\' 3"'$  (1.6 m)   Wt 65 kg   SpO2 95%   BMI 25.38 kg/m      PHYSICAL EXAM    GENERAL:NAD, no fevers, chills, no weakness no fatigue HEAD: Normocephalic, atraumatic.  EYES: Pupils equal, round, reactive to light. Extraocular muscles intact. No scleral icterus.  MOUTH: Moist mucosal membrane. Dentition intact. No abscess noted.  EAR, NOSE, THROAT: Clear without exudates. No external lesions.   NECK: Supple.  No thyromegaly. No nodules. No JVD.  PULMONARY: clear to auscultation CARDIOVASCULAR: S1 and S2. Regular rate and rhythm. No murmurs, rubs, or gallops. No edema. Pedal pulses 2+ bilaterally.  GASTROINTESTINAL: Soft, nontender, nondistended. No masses. Positive bowel sounds. No hepatosplenomegaly.  MUSCULOSKELETAL: No swelling, clubbing, or edema. Range of motion full in all extremities.  NEUROLOGIC: Cranial nerves II through XII are intact. No gross focal neurological deficits. Sensation intact. Reflexes intact.  SKIN: No ulceration, lesions, rashes, or cyanosis. Skin warm and dry. Turgor intact.  PSYCHIATRIC: Mood, affect within normal limits. The patient is awake, alert and oriented x 3. Insight, judgment intact.       IMAGING    CT CHEST W CONTRAST  Result Date: 05/08/2021 CLINICAL DATA:  86 year old female with history of pneumonia noted on recent chest x-ray. COVID positive patient. Evaluate for pneumonia versus interstitial lung disease. History of Sjogren's disease and cutaneous lupus. EXAM: CT CHEST WITH CONTRAST TECHNIQUE: Multidetector CT imaging of the chest was performed during intravenous contrast administration. CONTRAST:  27m OMNIPAQUE IOHEXOL 350 MG/ML SOLN COMPARISON:  Chest CT 05/05/2021. FINDINGS: Cardiovascular: Heart size is normal. There is no significant pericardial fluid, thickening or pericardial calcification. There is aortic atherosclerosis, as well as atherosclerosis of the great vessels of the mediastinum and the coronary arteries, including calcified atherosclerotic plaque in the left anterior descending and right coronary arteries. Calcifications of the mitral annulus. Mediastinum/Nodes: Multiple prominent borderline enlarged mediastinal lymph nodes are noted, nonspecific and likely reactive. No definite pathologically enlarged mediastinal or hilar lymph nodes. Small hiatal hernia. No axillary lymphadenopathy. Lungs/Pleura: Widespread but patchy areas  of ground-glass attenuation, interlobular septal thickening and thickening of the peribronchovascular interstitium are noted throughout all aspects of the lungs bilaterally, indicative of severe multilobar bilateral bronchopneumonia, presumably secondary to reported COVID infection. Trace bilateral pleural effusions layer dependently. Upper Abdomen: Aortic atherosclerosis.  Status post cholecystectomy. Musculoskeletal: Status post left modified radical mastectomy. Chronic compression fracture of L1 with 25% loss of anterior vertebral body height and post vertebroplasty changes. There are no aggressive appearing lytic or blastic lesions noted in the visualized portions of the skeleton. IMPRESSION: 1. Severe multilobar bilateral bronchopneumonia, presumably secondary to reported COVID infection. 2. Trace bilateral pleural effusions. 3. Small hiatal hernia. 4. Aortic atherosclerosis, in addition to 2 vessel coronary artery disease. Aortic Atherosclerosis (ICD10-I70.0). Electronically Signed   By: DVinnie LangtonM.D.   On: 05/08/2021 16:15   DG Chest Port 1 View  Result Date: 05/10/2021 CLINICAL DATA:  Abnormal chest x-ray. EXAM: PORTABLE CHEST 1 VIEW COMPARISON:  Radiograph 05/05/2021.  CT 05/08/2021 FINDINGS: Worsening lung aeration with progressive patchy opacities involving the right greater than left lung. Small pleural effusions have increased. Heart is normal in size, stable mediastinal contours. No pneumothorax. Again seen eventration of right hemidiaphragm. IMPRESSION: Worsening lung aeration with progressive patchy opacities involving the right greater than left lung. Findings may favor multifocal pneumonia over pulmonary edema. Small bilateral pleural effusions. Electronically Signed   By: MKeith RakeM.D.   On: 05/10/2021 17:25   DG Chest Portable 1 View  Result Date: 05/05/2021 CLINICAL DATA:  Weakness, COVID positive EXAM: PORTABLE CHEST 1 VIEW COMPARISON:  02/09/2018 FINDINGS: Mild patchy  opacities in the right upper lobe and right mid lung. Mild left basilar opacity, possibly atelectasis. No pleural effusion or pneumothorax. Partial eventration of the right hemidiaphragm. The heart is normal in size. Suspected healed right lateral clavicle fracture. IMPRESSION: Mild patchy opacities in the right lung, compatible with pneumonia in this patient  with known COVID. Electronically Signed   By: Julian Hy M.D.   On: 05/05/2021 21:22   ECHOCARDIOGRAM COMPLETE  Result Date: 05/10/2021    ECHOCARDIOGRAM REPORT   Patient Name:   Kim Santiago Date of Exam: 05/10/2021 Medical Rec #:  [id number]        Height:       63.0 in Accession #:    [id number]       Weight:       143.3 lb Date of Birth:  1935/04/06        BSA:          1.678 m Patient Age:    89 years         BP:           169/79 mmHg Patient Gender: F                HR:           99 bpm. Exam Location:  [facility] Procedure: 2D Echo and Strain Analysis Indications:     CHF Acute Systolic [id number]  History:         Patient has prior history of Echocardiogram examinations, most                  recent 11/08/2018.  Sonographer:     Kathlen Brunswick RDCS Referring Phys:  [id number] Ottie Glazier Diagnosing Phys: Kate Sable MD  Sonographer Comments: Global longitudinal strain was attempted. IMPRESSIONS  1. Left ventricular ejection fraction, by estimation, is 55 to 60%. The left ventricle has normal function. The left ventricle has no regional wall motion abnormalities. Left ventricular diastolic parameters were normal.  2. Right ventricular systolic function is normal. The right ventricular size is normal.  3. The mitral valve is normal in structure. No evidence of mitral valve regurgitation.  4. The aortic valve is tricuspid. Aortic valve regurgitation is not visualized.  5. The inferior vena cava is normal in size with greater than 50% respiratory variability, suggesting right atrial pressure of 3 mmHg. FINDINGS  Left Ventricle: Left ventricular  ejection fraction, by estimation, is 55 to 60%. The left ventricle has normal function. The left ventricle has no regional wall motion abnormalities. Global longitudinal strain performed but not reported based on interpreter judgement due to suboptimal tracking. The left ventricular internal cavity size was normal in size. There is no left ventricular hypertrophy. Left ventricular diastolic parameters were normal. Right Ventricle: The right ventricular size is normal. No increase in right ventricular wall thickness. Right ventricular systolic function is normal. Left Atrium: Left atrial size was normal in size. Right Atrium: Right atrial size was normal in size. Pericardium: There is no evidence of pericardial effusion. Mitral Valve: The mitral valve is normal in structure. No evidence of mitral valve regurgitation. Tricuspid Valve: The tricuspid valve is normal in structure. Tricuspid valve regurgitation is mild. Aortic Valve: The aortic valve is tricuspid. Aortic valve regurgitation is not visualized. Aortic valve peak gradient measures 6.8 mmHg. Pulmonic Valve: The pulmonic valve was normal in structure. Pulmonic valve regurgitation is trivial. Aorta: The aortic root and ascending aorta are structurally normal, with no evidence of dilitation. Venous: The inferior vena cava is normal in size with greater than 50% respiratory variability, suggesting right atrial pressure of 3 mmHg. IAS/Shunts: No atrial level shunt detected by color flow Doppler.  LEFT VENTRICLE PLAX 2D LVIDd:         3.50 cm  Diastology LVIDs:  2.50 cm  LV e' medial:    14.10 cm/s LV PW:         1.00 cm  LV E/e' medial:  7.6 LV IVS:        1.00 cm  LV e' lateral:   15.30 cm/s LVOT diam:     1.90 cm  LV E/e' lateral: 7.0 LV SV:         53 LV SV Index:   32 LVOT Area:     2.84 cm  RIGHT VENTRICLE RV Basal diam:  2.90 cm RV S prime:     15.10 cm/s TAPSE (M-mode): 2.1 cm LEFT ATRIUM             Index       RIGHT ATRIUM          Index LA diam:         3.00 cm 1.79 cm/m  RA Area:     9.68 cm LA Vol (A2C):   22.6 ml 13.47 ml/m RA Volume:   20.40 ml 12.16 ml/m LA Vol (A4C):   20.6 ml 12.28 ml/m LA Biplane Vol: 22.5 ml 13.41 ml/m  AORTIC VALVE                PULMONIC VALVE AV Area (Vmax): 2.11 cm    PV Vmax:       0.95 m/s AV Vmax:        130.00 cm/s PV Peak grad:  3.6 mmHg AV Peak Grad:   6.8 mmHg LVOT Vmax:      96.60 cm/s LVOT Vmean:     59.600 cm/s LVOT VTI:       0.187 m  AORTA Ao Root diam: 2.90 cm Ao Asc diam:  3.20 cm MITRAL VALVE                TRICUSPID VALVE MV Area (PHT): 5.16 cm     TV Peak grad:   27.1 mmHg MV Decel Time: 147 msec     TV Vmax:        2.61 m/s MV E velocity: 107.00 cm/s MV A velocity: 33.40 cm/s   SHUNTS MV E/A ratio:  3.20         Systemic VTI:  0.19 m                             Systemic Diam: 1.90 cm Kate Sable MD Electronically signed by Kate Sable MD Signature Date/Time: 05/10/2021/3:32:19 PM    Final       ASSESSMENT/PLAN   Acute hypoxemic respiratory failure -Patient had COVID-19 4 weeks ago.  Due to relative immunocompromise state with methotrexate she is at high risk for long COVID syndrome, additionally cycle threshold value is 30.3 indicative of active replication of virus and ongoing infection.  Recommendation for repeated treatment and consideration of high titer convalescent plasma available for these cases -Agree with Fungitell testing as patient is at increased risk for fungal infection -Review of CT chest above does show bilateral pulmonary congestion with edema and atelectasis worse at the left lower lobe.  Patient has history of CHF with previous echo showing EF of 35 as well as another echo showing EF of 65 with diastolic dysfunction.  We will repeat transthoracic echo as well as obtain BNP today.  Patient may benefit from diuresis if possible. -Discussed with ID and placed consultation for High titer convalescent plasma -continue current therapy with solumedrol and Remdesevir.   PT/OT  Fungitell in process, histoplasma still in process  Bibasilar atelectasis -upgrade recruitment to metaNEB with albuterol BID with RT -may not be able to do due to COVID precautions but can attempt VEST -continue IS and flutter      Chronic systolic and diastolic CHF-RESOLVED  S/P TTE 05/09/21- NORMAL   - patient with 1+ pedal edema   -will repeat TTE and BNP today  -BNP >300    Thank you for allowing me to participate in the care of this patient.    Patient/Family are satisfied with care plan and all questions have been answered.  This document was prepared using Dragon voice recognition software and may include unintentional dictation errors.     Ottie Glazier, M.D.  Division of [place]

## 2021-05-12 NOTE — Progress Notes (Signed)
[facility] PROGRESS NOTE    Kim Santiago  TKP:[id number] DOB: 1934-09-24 DOA: 05/05/2021 PCP: [name], MD      Brief Narrative:  Kim Santiago is a 86 y.o. 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  In the ER, HR 106, and CXR showed patchy pneumonia.  Admitted for treatment of pneumonia.         Assessment & Plan:  Likely delayed COVID pneumonitis See summary from 9/2  Crypto antigen negative.   AM cortisol normal.  Fever curve resolved after starting remdesivir and steorids  - Continue remdesivir, steroids, day 4 - Consult Pulmonlogy, ID  - Follow sputum, fungitell, histo, aspergillus Ag, CMV, fungal antibodies, COVID Ab profile - Hold methotrexate - Continue flutter and incentive spirometer     Acute respiratory failure with hypoxia Patient presented with respiratory rates 21 to 33 over the first 2 days in the hospita, now persistently requiring 2L O2 Still on O2, attempt to wean as able  Acute on chronic macrocytic anemia Chronic anemia ~10 g/dL   Here, acutely worse, down to 6.9 g/dL on 9/2.  Transfused 1 unit that day, posttransfusion hemoglobin today up and stable today from yesterday No hemolysis or bleeding B12 and folate normal, TSH normal  Hemoglobin slightly better - Hold methotrexate  - Consult hematology, [facility] - Continue Folate    Leukopenia Resolved with starting remdesivir and resolution of COVID  Hyponatremia Stable, asymptomatic  Cutaneous lupus - Hold methotrexate  History of breast cancer  Hypothyroidism -Continue levothyroxine  Hypertension Coronary disease Blood pressure normal - Continue atorvastatin, losartan, metoprolol - Hold Lasix   Depression/anxiety - Continue sertraline and trazodone  GERD - Continue PPI             Disposition: Status is: Inpatient  Remains inpatient appropriate because:Inpatient level of care appropriate due to severity of illness and ongoing work up requiring inpatient care and IV remdesivir  Dispo: The patient is from: Home              Anticipated d/c is to: SNF              Patient currently is not medically stable to d/c.   Difficult to place patient No   Level of care: Med-Surg             MDM: The below labs and imaging reports were reviewed and summarized above.  Medication management as above.    DVT prophylaxis: enoxaparin (LOVENOX) injection 40 mg Start: 05/05/21 2330  Code Status: DNR Family Communication: Daughter at the bedside             Subjective: Still feels tired, but somewhat better.  No new pain complaints, no new fever or confusion.       Objective: Vitals:   05/12/21 0457 05/12/21 0805 05/12/21 0907 05/12/21 1136  BP: (!) 140/55 127/70  131/60  Pulse: 90 94 97 91  Resp: 16 18  18   Temp: 98.1 F (36.7 C) 98.2 F (36.8 C)  97.8 F (36.6 C)  TempSrc:      SpO2: 95% (!) 87% 94% 91%  Weight:      Height:        Intake/Output Summary (Last 24 hours) at 05/12/2021 1418 Last data filed at 05/12/2021 0455 Gross per 24 hour  Intake --  Output 300 ml  Net -300 ml  Filed Weights   05/05/21 1731  Weight: 65 kg    Examination: General appearance: Elderly female, lying in bed, no acute distress     HEENT: Anicteric, conjunctive a pink, lids and lashes normal.  No nasal deformity, discharge Skin: More than average senile purpura, as well as admixed reddish macular rash, which she indicates is her cutaneous lupus Cardiac: RRR, no murmurs, no lower extremity edema Respiratory: Normal respiratory rate and rhythm, lungs clear without rales or wheezes Abdomen: Abdomen soft without tenderness palpation or guarding, no ascites or distention MSK:  Neuro: Awake and alert, extraocular movements intact, face symmetric,  speech fluent, moves all extremities with generalized weakness Psych: Sleepy but attention normal, affect and judgment appear to      Data Reviewed: I have personally reviewed following labs and imaging studies:  CBC: Recent Labs  Lab 05/09/21 1201 05/10/21 0620 05/10/21 1849 05/11/21 0511 05/12/21 0458  WBC 2.8* 3.2* 3.1* 3.0* 4.3  NEUTROABS  --   --  2.4  --   --   HGB 6.9* 8.6* 9.4* 8.8* 9.2*  HCT 19.4* 23.8* 26.5* 24.6* 26.3*  MCV 107.2* 101.3* 99.3 100.0 98.5  PLT 153 140* 140* 136* 161   Basic Metabolic Panel: Recent Labs  Lab 05/07/21 0551 05/08/21 0436 05/09/21 0740 05/10/21 0620 05/11/21 0511  NA 130* 130* 137 134* 133*  K 3.9 4.1 4.7 4.1 3.8  CL 99 100 103 103 97*  CO2 26 26 23 25 29   GLUCOSE 94 103* 176* 133* 133*  BUN 15 17 41* 12 23  CREATININE 0.67 0.82 1.06* 0.65 0.75  CALCIUM 7.6* 7.7* 10.3 8.0* 8.4*  MG 1.9  --   --   --   --    GFR: Estimated Creatinine Clearance: 47.4 mL/min (by C-G formula based on SCr of 0.75 mg/dL). Liver Function Tests: Recent Labs  Lab 05/08/21 0436 05/11/21 0511  AST 22 23  ALT 8 8  ALKPHOS 37* 49  BILITOT 0.6 0.7  PROT 4.6* 5.0*  ALBUMIN 2.2* 2.3*   No results for input(s): LIPASE, AMYLASE in the last 168 hours. No results for input(s): AMMONIA in the last 168 hours. Coagulation Profile: No results for input(s): INR, PROTIME in the last 168 hours. Cardiac Enzymes: No results for input(s): CKTOTAL, CKMB, CKMBINDEX, TROPONINI in the last 168 hours. BNP (last 3 results) No results for input(s): PROBNP in the last 8760 hours. HbA1C: No results for input(s): HGBA1C in the last 72 hours. CBG: No results for input(s): GLUCAP in the last 168 hours. Lipid Profile: No results for input(s): CHOL, HDL, LDLCALC, TRIG, CHOLHDL, LDLDIRECT in the last 72 hours. Thyroid Function Tests: No results for input(s): TSH, T4TOTAL, FREET4, T3FREE, THYROIDAB in the last 72 hours.  Anemia Panel: No results for input(s):  VITAMINB12, FOLATE, FERRITIN, TIBC, IRON, RETICCTPCT in the last 72 hours.  Urine analysis:    Component Value Date/Time   COLORURINE YELLOW (A) 05/08/2021 1810   APPEARANCEUR CLEAR (A) 05/08/2021 1810   LABSPEC 1.044 (H) 05/08/2021 1810   PHURINE 5.0 05/08/2021 1810   GLUCOSEU NEGATIVE 05/08/2021 1810   GLUCOSEU NEGATIVE 05/11/2017 1143   HGBUR NEGATIVE 05/08/2021 1810   BILIRUBINUR NEGATIVE 05/08/2021 1810   BILIRUBINUR negative 07/04/2018 1134   KETONESUR 5 (A) 05/08/2021 1810   PROTEINUR NEGATIVE 05/08/2021 1810   UROBILINOGEN 0.2 07/04/2018 1134   UROBILINOGEN 0.2 05/11/2017 1143   NITRITE NEGATIVE 05/08/2021 1810   LEUKOCYTESUR NEGATIVE 05/08/2021 1810   Sepsis Labs: @LABRCNTIP (procalcitonin:4,lacticacidven:4)  ) Recent Results (  from the past 240 hour(s))  Resp Panel by RT-PCR (Flu A&B, Covid) Nasopharyngeal Swab     Status: Abnormal   Collection Time: 05/05/21  9:49 PM   Specimen: Nasopharyngeal Swab; Nasopharyngeal(NP) swabs in vial transport medium  Result Value Ref Range Status   SARS Coronavirus 2 by RT PCR POSITIVE (A) NEGATIVE Final    Comment: RESULT CALLED TO, READ BACK BY AND VERIFIED WITH: SANTIA BULLOCK@2330  05/05/21 RH (NOTE) SARS-CoV-2 target nucleic acids are DETECTED.  The SARS-CoV-2 RNA is generally detectable in upper respiratory specimens during the acute phase of infection. Positive results are indicative of the presence of the identified virus, but do not rule out bacterial infection or co-infection with other pathogens not detected by the test. Clinical correlation with patient history and other diagnostic information is necessary to determine patient infection status. The expected result is Negative.  Fact Sheet for Patients: EntrepreneurPulse.com.au  Fact Sheet for Healthcare Providers: IncredibleEmployment.be  This test is not yet approved or cleared by the [place] FDA and  has been authorized  for detection and/or diagnosis of SARS-CoV-2 by FDA under an Emergency Use Authorization (EUA).  This EUA will remain in effect (meaning this test can be [place] ed) for the duration of  the COVID-19 declaration under Section 564(b)(1) of the Act, 21 U.S.C. section 360bbb-3(b)(1), unless the authorization is terminated or revoked sooner.     Influenza A by PCR NEGATIVE NEGATIVE Final   Influenza B by PCR NEGATIVE NEGATIVE Final    Comment: (NOTE) The Xpert Xpress SARS-CoV-2/FLU/RSV plus assay is intended as an aid in the diagnosis of influenza from Nasopharyngeal swab specimens and should not be used as a sole basis for treatment. Nasal washings and aspirates are unacceptable for Xpert Xpress SARS-CoV-2/FLU/RSV testing.  Fact Sheet for Patients: EntrepreneurPulse.com.au  Fact Sheet for Healthcare Providers: IncredibleEmployment.be  This test is not yet approved or cleared by the [place] FDA and has been authorized for detection and/or diagnosis of SARS-CoV-2 by FDA under an Emergency Use Authorization (EUA). This EUA will remain in effect (meaning this test can be used) for the duration of the COVID-19 declaration under Section 564(b)(1) of the Act, 21 U.S.C. section 360bbb-3(b)(1), unless the authorization is terminated or revoked.  Performed at [facility], [address] [place], [place] [address]   Blood culture (routine x 2)     Status: None   Collection Time: 05/05/21 10:18 PM   Specimen: BLOOD  Result Value Ref Range Status   Specimen Description BLOOD RIGHT HAND  Final   Special Requests   Final    BOTTLES DRAWN AEROBIC AND ANAEROBIC Blood Culture adequate volume   Culture   Final    NO GROWTH 5 DAYS Performed at [facility], [address] [place], [place] [address]    Report Status 05/10/2021 FINAL  Final  Blood culture (routine x 2)     Status: None   Collection Time: 05/05/21 11:17 PM   Specimen:  BLOOD  Result Value Ref Range Status   Specimen Description BLOOD RIGHT ARM  Final   Special Requests   Final    BOTTLES DRAWN AEROBIC AND ANAEROBIC Blood Culture adequate volume   Culture   Final    NO GROWTH 5 DAYS Performed at [facility], [address] [place], [place] [address]    Report Status 05/10/2021 FINAL  Final  MRSA Next Gen by PCR, Nasal     Status: None   Collection Time: 05/07/21  9:16  AM   Specimen: Nasal Mucosa; Nasal Swab  Result Value Ref Range Status   MRSA by PCR Next Gen NOT DETECTED NOT DETECTED Final    Comment: (NOTE) The GeneXpert MRSA Assay (FDA approved for NASAL specimens only), is one component of a comprehensive MRSA colonization surveillance program. It is not intended to diagnose MRSA infection nor to guide or monitor treatment for MRSA infections. Test performance is not FDA approved in patients less than 31 years old. Performed at [facility], [place], [place] [address]   Expectorated Sputum Assessment w Gram Stain, Rflx to Resp Cult     Status: None   Collection Time: 05/07/21  1:18 PM   Specimen: Sputum  Result Value Ref Range Status   Specimen Description SPUTUM  Final   Special Requests NONE  Final   Sputum evaluation   Final    Sputum specimen not acceptable for testing.  Please recollect.   RESULT CALLED TO, READ BACK BY AND VERIFIED WITH: Pat Patrick 05/07/21 1411 KLW Performed at [facility], [address] [place], [place] [address]    Report Status 05/07/2021 FINAL  Final  Respiratory (~20 pathogens) panel by PCR     Status: None   Collection Time: 05/07/21  4:59 PM   Specimen: Nasopharyngeal Swab; Respiratory  Result Value Ref Range Status   Adenovirus NOT DETECTED NOT DETECTED Final   Coronavirus 229E NOT DETECTED NOT DETECTED Final    Comment: (NOTE) The Coronavirus on the Respiratory Panel, DOES NOT test for the novel  Coronavirus (2019 nCoV)    Coronavirus HKU1 NOT  DETECTED NOT DETECTED Final   Coronavirus NL63 NOT DETECTED NOT DETECTED Final   Coronavirus OC43 NOT DETECTED NOT DETECTED Final   Metapneumovirus NOT DETECTED NOT DETECTED Final   Rhinovirus / Enterovirus NOT DETECTED NOT DETECTED Final   Influenza A NOT DETECTED NOT DETECTED Final   Influenza B NOT DETECTED NOT DETECTED Final   Parainfluenza Virus 1 NOT DETECTED NOT DETECTED Final   Parainfluenza Virus 2 NOT DETECTED NOT DETECTED Final   Parainfluenza Virus 3 NOT DETECTED NOT DETECTED Final   Parainfluenza Virus 4 NOT DETECTED NOT DETECTED Final   Respiratory Syncytial Virus NOT DETECTED NOT DETECTED Final   Bordetella pertussis NOT DETECTED NOT DETECTED Final   Bordetella Parapertussis NOT DETECTED NOT DETECTED Final   Chlamydophila pneumoniae NOT DETECTED NOT DETECTED Final   Mycoplasma pneumoniae NOT DETECTED NOT DETECTED Final    Comment: Performed at [facility], [address] [place], [place] [address]  Expectorated Sputum Assessment w Gram Stain, Rflx to Resp Cult     Status: None   Collection Time: 05/07/21  7:19 PM   Specimen: Expectorated Sputum  Result Value Ref Range Status   Specimen Description EXPECTORATED SPUTUM  Final   Special Requests NONE  Final   Sputum evaluation   Final    THIS SPECIMEN IS ACCEPTABLE FOR SPUTUM CULTURE Performed at [facility], [address], [place], [place] [address]    Report Status 05/07/2021 FINAL  Final  Culture, Respiratory w Gram Stain     Status: None   Collection Time: 05/07/21  7:19 PM  Result Value Ref Range Status   Specimen Description   Final    EXPECTORATED SPUTUM Performed at [facility], [address], [place],  [address]    Special Requests   Final    NONE Reflexed from [id number] Performed at [facility], [address] [place], [place] [address]    Gram Stain   Final    NO WBC SEEN RARE GRAM POSITIVE COCCI IN PAIRS RARE GRAM POSITIVE RODS RARE BUDDING YEAST  SEEN Performed at [facility], [place] [address] [place], [place] [address]    Culture RARE CANDIDA ALBICANS  Final   Report Status 05/10/2021 FINAL  Final         Radiology Studies: DG Chest Port 1 View  Result Date: 05/10/2021 CLINICAL DATA:  Abnormal chest x-ray. EXAM: PORTABLE CHEST 1 VIEW COMPARISON:  Radiograph 05/05/2021.  CT 05/08/2021 FINDINGS: Worsening lung aeration with progressive patchy opacities involving the right greater than left lung. Small pleural effusions have increased. Heart is normal in size, stable mediastinal contours. No pneumothorax. Again seen eventration of right hemidiaphragm. IMPRESSION: Worsening lung aeration with progressive patchy opacities involving the right greater than left lung. Findings may favor multifocal pneumonia over pulmonary edema. Small bilateral pleural effusions. Electronically Signed   By: Keith Rake M.D.   On: 05/10/2021 17:25        Scheduled Meds:  acidophilus  1 capsule Oral Daily   atorvastatin  20 mg Oral Daily   calcium-vitamin D  2 tablet Oral Daily   cholecalciferol  2,000 Units Oral Daily   enoxaparin (LOVENOX) injection  40 mg Subcutaneous P80D   folic acid  1 mg Oral Daily   furosemide  20 mg Intravenous BID   guaiFENesin  600 mg Oral BID   levothyroxine  75 mcg Oral Q0600   losartan  25 mg Oral Daily   methylPREDNISolone (SOLU-MEDROL) injection  20 mg Intravenous Daily   metoprolol succinate  25 mg Oral Daily   pantoprazole  40 mg Oral Daily   polyethylene glycol  17 g Oral Daily   sertraline  25 mg Oral Daily   traZODone  50 mg Oral QHS   Continuous Infusions:  remdesivir 100 mg in NS 100 mL 100 mg (05/12/21 0954)     LOS: 7 days    Time spent: 25 minutes    Edwin Dada, MD [facility] 05/12/2021, 2:18 PM     Please page though Speed or Epic secure chat:  For [employer], Adult nurse

## 2021-05-12 NOTE — Progress Notes (Signed)
Date of Admission:  05/05/2021      ID:     Subjective: Pt says she is not back to her normal self- today was a rough day Her breathing was rough No fever but feels hot   Medications:   acidophilus  1 capsule Oral Daily   atorvastatin  20 mg Oral Daily   calcium-vitamin D  2 tablet Oral Daily   cholecalciferol  2,000 Units Oral Daily   enoxaparin (LOVENOX) injection  40 mg Subcutaneous [id number]   folic acid  1 mg Oral Daily   furosemide  20 mg Intravenous BID   guaiFENesin  600 mg Oral BID   levothyroxine  75 mcg Oral Q0600   losartan  25 mg Oral Daily   methylPREDNISolone (SOLU-MEDROL) injection  20 mg Intravenous Daily   metoprolol succinate  25 mg Oral Daily   pantoprazole  40 mg Oral Daily   polyethylene glycol  17 g Oral Daily   sertraline  25 mg Oral Daily   traZODone  50 mg Oral QHS    Objective: Vital signs in last 24 hours: Temp:  [97.8 F (36.6 C)-98.2 F (36.8 C)] 97.8 F (36.6 C) (09/05 1136) Pulse Rate:  [76-97] 91 (09/05 1136) Resp:  [16-18] 18 (09/05 1136) BP: (127-148)/(55-70) 131/60 (09/05 1136) SpO2:  [87 %-95 %] 91 % (09/05 1136)  PHYSICAL EXAM:  General: Alert, cooperative, no distress, appears stated age.  Head: Normocephalic, without obvious abnormality, atraumatic. Eyes: Conjunctivae clear, anicteric sclerae. Pupils are equal ENT Nares normal. No drainage or sinus tenderness. Lips, mucosa, and tongue normal. No Thrush Neck: Supple, symmetrical, no adenopathy, thyroid: non tender no carotid bruit and no JVD. Back: No CVA tenderness. Lungs: b/l air entry- few basal crepts. Heart:s1s2 Abdomen: Soft, non-tender,not distended. Bowel sounds normal. No masses Extremities: atraumatic, no cyanosis. No edema. No clubbing Skin: No rashes or lesions. Or bruising Lymph: Cervical, supraclavicular normal. Neurologic: Grossly non-focal  Lab Results Recent Labs    05/10/21 0620 05/10/21 1849 05/11/21 0511 05/12/21 0458  WBC 3.2*   < > 3.0* 4.3   HGB 8.6*   < > 8.8* 9.2*  HCT 23.8*   < > 24.6* 26.3*  NA 134*  --  133*  --   K 4.1  --  3.8  --   CL 103  --  97*  --   CO2 25  --  29  --   BUN 12  --  23  --   CREATININE 0.65  --  0.75  --    < > = values in this interval not displayed.   Liver Panel Recent Labs    05/11/21 0511  PROT 5.0*  ALBUMIN 2.3*  AST 23  ALT 8  ALKPHOS 49  BILITOT 0.7   Sedimentation Rate No results for input(s): ESRSEDRATE in the last 72 hours. C-Reactive Protein Recent Labs    05/11/21 0511  CRP 13.7*    Microbiology:  Studies/Results: DG Chest Port 1 View  Result Date: 05/10/2021 CLINICAL DATA:  Abnormal chest x-ray. EXAM: PORTABLE CHEST 1 VIEW COMPARISON:  Radiograph 05/05/2021.  CT 05/08/2021 FINDINGS: Worsening lung aeration with progressive patchy opacities involving the right greater than left lung. Small pleural effusions have increased. Heart is normal in size, stable mediastinal contours. No pneumothorax. Again seen eventration of right hemidiaphragm. IMPRESSION: Worsening lung aeration with progressive patchy opacities involving the right greater than left lung. Findings may favor multifocal pneumonia over pulmonary edema. Small bilateral pleural effusions. Electronically Signed  By: Keith Rake M.D.   On: 05/10/2021 17:25     Assessment/Plan:  B/l pulmonary infiltrate with fever and  acute hypoxia , treated initially like CAP with ceftriaxone and azithromycin with no response  COVID infection on 04/17/21  received molnupiravir as OP. She likely has long covid because of immunecompromised state  started remdisivir and prednisone on 05/09/21 Has improved Extensive lab work up for opportunistic infection of the lung sent- pending ( CMV, Beta D glucan, fungal antibodies, aspergillus, Crypto( neg)   Cutaneous lupus on methotrate- recent flare was treated with a 7 week prednisone taper    Borderline Pancytopenia  Discussed the management with patient and care team

## 2021-05-13 ENCOUNTER — Other Ambulatory Visit: Payer: PPO

## 2021-05-13 ENCOUNTER — Inpatient Hospital Stay: Payer: PPO

## 2021-05-13 ENCOUNTER — Other Ambulatory Visit: Payer: Self-pay | Admitting: Internal Medicine

## 2021-05-13 LAB — HAPTOGLOBIN: Haptoglobin: 316 mg/dL (ref 41–333)

## 2021-05-13 MED ORDER — DEXAMETHASONE 6 MG PO TABS: 6.0000 mg | ORAL_TABLET | Freq: Every day | ORAL | 0 refills | Status: DC

## 2021-05-13 MED ORDER — PREDNISONE 20 MG PO TABS: 40.0000 mg | ORAL_TABLET | Freq: Every day | ORAL | Status: DC

## 2021-05-13 NOTE — TOC Transition Note (Signed)
Transition of Care [facility]) - CM/SW Discharge Note   Patient Details  Name: Kim Santiago MRN: [id number] Date of Birth: [birth year]  Transition of Care [facility] CM/SW Contact:  Shelbie Hutching, RN Phone Number: 05/13/2021, 10:26 AM   Clinical Narrative:     Patient is medically cleared for discharge home with home health services today through [facility].  Corene Cornea with [facility] is aware of discharge today, orders for [facility] PT and OT in.  Patient needs oxygen for home, daughter has no preference in DME agency.  [facility] given oxygen referral and it will be delivered to the room before discharge.  Patient's daughter [name] is at the bedside and will transport patient home.   [place] previously delivered to room by [place].    Final next level of care: [place] Barriers to Discharge: Barriers Resolved   Patient Goals and CMS Choice Patient states their goals for this hospitalization and ongoing recovery are:: Glad to be going home today CMS Medicare.gov Compare Post Acute Care list provided to:: Patient Represenative (must comment) Choice offered to / list presented to : Adult Children  Discharge Placement                       Discharge Plan and Services   Discharge Planning Services: CM Consult            DME Arranged: Oxygen DME Agency: [facility] Date DME Agency Contacted: 05/13/21 Time DME Agency Contacted: [phone] Representative spoke with at DME Agency: [place]: PT, OT [place] Agency: [place] ([place]) Date [place]: 05/13/21 Time [place]: 1025 Representative spoke with at [place]: [place] ([place]) Interventions     Readmission Risk Interventions No flowsheet data found.

## 2021-05-13 NOTE — Discharge Summary (Addendum)
Physician Discharge Summary  Kim Santiago JSE:[id number] DOB: Mar 02, 1935 DOA: 05/05/2021  PCP: [name], MD  Admit date: 05/05/2021 Discharge date: 05/13/2021  Admitted From: Home  Disposition:  Home with [facility]   Recommendations for Outpatient Follow-up:  Follow up with Dr. Derrel Nip PCP in 1 week Follow up with Dr. Lanney Gins in 4 weeks Dr. Derrel Nip: Please obtain CBC in one week and refer to hematology if indices worsening again Dr. Lanney Gins: Please follow up on the following pending results: Methylmalonic acid CMV quant Antifungal antibodies (9/3 misc sendout) SARS COV 2 Antibody profile (9/2 misc sendout) Fungitell Histoplasma antigen      Home Health: PT/OT due to shortness of breath Equipment/Devices: Walker   Discharge Condition: Fair  CODE STATUS: DNR Diet recommendation: Regular  Brief/Interim Summary: Kim Santiago is a 86 y.o. 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   In the ER, HR 106, and CXR showed patchy pneumonia.  Admitted for treatment of pneumonia.         PRINCIPAL HOSPITAL DIAGNOSIS: Delayed COVID pneumonia     Discharge Diagnoses:   Likely delayed COVID pneumonitis Admitted with chest imaging showed RUL/RML patchy opacities, started on antibiotics.  Active COVID was initially doubted given symptom initiation all the way back on 8/9, completed molnupiravir and resolution of symptoms, well before presentation.     However, in the hospital, she did not respond to appropriate Abx therapy.  RVP negative.  Sputum culture negative.     CT chest obtained in the hospital on day 4 of antibiotics showed diffuse GGO opacity, consistent with COVID, or atypical pneumonia or other pneumonitis.   Crypto antigen negative.  AM cortisol normal.  ID and Pulmonology were consulted, and felt that delayed COVID was  likely.   Remdesivir and steroids were started.    Fever curve resolved after starting remdesivir and steorids.  Completed 5 days remdesivir and felt well.  Discharged to complete 10 days dexamethasone.     Hold methotrexate.  Follow up with Pulmonology and PCP.         Acute respiratory failure with hypoxia Patient presented with respiratory rates 21 to 33 over the first 2 days in the hospita, now persistently requiring 2L O2   Acute on chronic macrocytic anemia Chronic anemia ~10 g/dL    Here, acutely worse, down to 6.9 g/dL on 9/2.  Transfused 1 unit that day, posttransfusion hemoglobin up and stable.  Improved with remdesivir.  No hemolysis or bleeding. B12 and folate normal, TSH normal.    Leukopenia Resolved with starting remdesivir and resolution of COVID   Hyponatremia  Cutaneous lupus Hold methotrexate  History of breast cancer  Hypothyroidism  Hypertension Coronary disease   Depression/anxiety   GERD             Discharge Instructions  Discharge Instructions     Diet - low sodium heart healthy   Complete by: As directed    Discharge instructions   Complete by: As directed    From Dr. Loleta Books: You were admitted for fever and cough.  Here, we initially thought you had a normal garden variety pneumonia, and we started antibiotics for bacterial pneumonia.  However, you didn't improve, and on further review, we felt that this was likely COVID pneumonia, just prolonged due to your immune suppression.  We started remdesivir and steroids, and you improved!  You completed a  five day course of remdesivir You should continue steroids for 5 more days with dexamethasone 6 mg once daily in the morning  You should isolate for 5 more days, then you can come off isolation if you have no further fever.  Go see Dr. Derrel Nip in 10 days Have her check your blood levels and follow with her until they normalize  Other than the steroid, you should take your  furosemide 20 mg once daily for the next 4-5 days or until your foot swelling is gone   Also, do NOT take methotrexate for now, until you have followed up with Dr. Lanney Gins And do not take losartan, for now, until you see Dr. Derrel Nip and she tells you to restart   Go see Dr. Lanney Gins, Pulmonology, in 4 weeks for follow up of your illness   Use the oxygen all the time until you see your primary care doctor. The purpose of the  oxygen is to keep your oxygen level at 88% or above Use a pulse oximeter to measure your oxygen level.  If the home health agency doesn't bring you one of these, you can find them at any drug store.   Increase activity slowly   Complete by: As directed       Allergies as of 05/13/2021       Reactions   Azathioprine Nausea And Vomiting   Severe vomiting   Hydroxychloroquine Hives, Nausea And Vomiting   Mycophenolate Mofetil Nausea Only   Amoxicillin Rash   Has patient had a PCN reaction causing immediate rash, facial/tongue/throat swelling, SOB or lightheadedness with hypotension: Unknown Has patient had a PCN reaction causing severe rash involving mucus membranes or skin necrosis: Unknown Has patient had a PCN reaction that required hospitalization: Unknown Has patient had a PCN reaction occurring within the last 10 years: Unknown If all of the above answers are "NO", then may proceed with Cephalosporin use. Tolerates ceftriaxone and Ancef   Codeine Nausea And Vomiting   Naprosyn [naproxen] Swelling   Orudis [ketoprofen] Hives   Sulfa Antibiotics Rash   Sulfathiazole Rash        Medication List     STOP taking these medications    doxycycline 100 MG tablet Commonly known as: VIBRA-TABS   losartan 25 MG tablet Commonly known as: COZAAR   methotrexate 2.5 MG tablet Commonly known as: RHEUMATREX   predniSONE 20 MG tablet Commonly known as: DELTASONE       TAKE these medications    atorvastatin 20 MG tablet Commonly known as:  LIPITOR TAKE 1 TABLET BY MOUTH DAILY AT 6PM   Calcium Carbonate-Vitamin D 600-200 MG-UNIT Tabs Take 2 tablets by mouth daily.   cholecalciferol 1000 units tablet Commonly known as: VITAMIN D Take 2,000 Units by mouth daily.   CRANBERRY PO Take 1 capsule by mouth 2 (two) times daily.   dexamethasone 6 MG tablet Commonly known as: DECADRON Take 1 tablet (6 mg total) by mouth daily.   folic acid 1 MG tablet Commonly known as: FOLVITE Take by mouth.   furosemide 20 MG tablet Commonly known as: LASIX 1 TABLET EVERY OTHER DAY AS NEEDED   levothyroxine 75 MCG tablet Commonly known as: SYNTHROID TAKE 1 TABLET BY MOUTH (75MCG TOTAL) DAILY   metoprolol succinate 25 MG 24 hr tablet Commonly known as: TOPROL-XL TAKE 1 TABLET BY MOUTH EVERY DAY   nitroGLYCERIN 0.4 MG SL tablet Commonly known as: NITROSTAT Place 1 tablet (0.4 mg total) under the tongue every 5 (five) minutes  as needed for chest pain. Maximum 3 doses   omeprazole 20 MG capsule Commonly known as: PRILOSEC Take 1 capsule (20 mg total) by mouth 2 (two) times daily before a meal.   polyethylene glycol 17 g packet Commonly known as: MIRALAX / GLYCOLAX Take 17 g by mouth daily.   PROBIOTIC PO Take 1 tablet by mouth daily.   promethazine-dextromethorphan 6.25-15 MG/5ML syrup Commonly known as: PROMETHAZINE-DM Take 5 mLs by mouth 4 (four) times daily as needed for cough.   promethazine-phenylephrine 6.25-5 MG/5ML Syrp Commonly known as: promethazine-phenylephrine Take 5 mLs by mouth every 4 (four) hours as needed for congestion.   sertraline 25 MG tablet Commonly known as: ZOLOFT Take 25 mg by mouth daily.   sodium fluoride 1.1 % Crea dental cream Commonly known as: PREVIDENT 7893 PLUS Take 1 application by mouth daily.   Sodium Fluoride 5000 PPM 1.1 % Pste Generic drug: Sodium Fluoride Take by mouth.   tizanidine 2 MG capsule Commonly known as: ZANAFLEX Take 1 capsule (2 mg total) by mouth 3 (three)  times daily.   traZODone 50 MG tablet Commonly known as: DESYREL Take 50 mg by mouth at bedtime.   triamcinolone cream 0.1 % Commonly known as: KENALOG APPLY TO AFFECTED AREA TWICE A DAY               Durable Medical Equipment  (From admission, onward)           Start     Ordered   05/13/21 0949  DME Oxygen  Once       Question Answer Comment  Length of Need 6 Months   Mode or (Route) Nasal cannula   Liters per Minute 2   Frequency Continuous (stationary and portable oxygen unit needed)   Oxygen delivery system Gas      05/13/21 0953   05/10/21 1025  DME Walker rolling  (Discharge Planning)  Once       Question Answer Comment  Walker: With [place]   Patient needs a walker to treat with the following condition Pneumonia      05/10/21 1024            Follow-up Information     [name], MD. Go on 05/20/2021.   Specialty: Internal Medicine Why: @ 9am Contact information: [place] [address] [phone]         Ottie Glazier, MD. Go on 05/27/2021.   Specialty: Pulmonary Disease Why: @ 2:30pm Contact information: [place] [address] [phone]                Allergies  Allergen Reactions   Azathioprine Nausea And Vomiting    Severe vomiting   Hydroxychloroquine Hives and Nausea And Vomiting   Mycophenolate Mofetil Nausea Only   Amoxicillin Rash    Has patient had a PCN reaction causing immediate rash, facial/tongue/throat swelling, SOB or lightheadedness with hypotension: Unknown Has patient had a PCN reaction causing severe rash involving mucus membranes or skin necrosis: Unknown Has patient had a PCN reaction that required hospitalization: Unknown Has patient had a PCN reaction occurring within the last 10 years: Unknown If all of the above answers are "NO", then may proceed with Cephalosporin use. Tolerates ceftriaxone and Ancef   Codeine Nausea And Vomiting    Naprosyn [Naproxen] Swelling   Orudis [Ketoprofen] Hives   Sulfa Antibiotics Rash   Sulfathiazole Rash    Consultations: Puljmonology  Infectious idsease Hematology  Procedures/Studies: CT CHEST W CONTRAST  Result Date: 05/08/2021 CLINICAL DATA:  86 year old female with history of pneumonia noted on recent chest x-ray. COVID positive patient. Evaluate for pneumonia versus interstitial lung disease. History of Sjogren's disease and cutaneous lupus. EXAM: CT CHEST WITH CONTRAST TECHNIQUE: Multidetector CT imaging of the chest was performed during intravenous contrast administration. CONTRAST:  58m OMNIPAQUE IOHEXOL 350 MG/ML SOLN COMPARISON:  Chest CT 05/05/2021. FINDINGS: Cardiovascular: Heart size is normal. There is no significant pericardial fluid, thickening or pericardial calcification. There is aortic atherosclerosis, as well as atherosclerosis of the great vessels of the mediastinum and the coronary arteries, including calcified atherosclerotic plaque in the left anterior descending and right coronary arteries. Calcifications of the mitral annulus. Mediastinum/Nodes: Multiple prominent borderline enlarged mediastinal lymph nodes are noted, nonspecific and likely reactive. No definite pathologically enlarged mediastinal or hilar lymph nodes. Small hiatal hernia. No axillary lymphadenopathy. Lungs/Pleura: Widespread but patchy areas of ground-glass attenuation, interlobular septal thickening and thickening of the peribronchovascular interstitium are noted throughout all aspects of the lungs bilaterally, indicative of severe multilobar bilateral bronchopneumonia, presumably secondary to reported COVID infection. Trace bilateral pleural effusions layer dependently. Upper Abdomen: Aortic atherosclerosis.  Status post cholecystectomy. Musculoskeletal: Status post left modified radical mastectomy. Chronic compression fracture of L1 with 25% loss of anterior vertebral body height and post  vertebroplasty changes. There are no aggressive appearing lytic or blastic lesions noted in the visualized portions of the skeleton. IMPRESSION: 1. Severe multilobar bilateral bronchopneumonia, presumably secondary to reported COVID infection. 2. Trace bilateral pleural effusions. 3. Small hiatal hernia. 4. Aortic atherosclerosis, in addition to 2 vessel coronary artery disease. Aortic Atherosclerosis (ICD10-I70.0). Electronically Signed   By: DVinnie LangtonM.D.   On: 05/08/2021 16:15   DG Chest Port 1 View  Result Date: 05/13/2021 CLINICAL DATA:  Interstitial edema R60.9 (ICD-10-CM) EXAM: PORTABLE CHEST 1 VIEW COMPARISON:  05/10/2021. FINDINGS: Similar patchy bilateral airspace opacities, further characterized on prior CT chest. No visible pneumothorax. Similar versus slightly increased small left greater than right pleural effusions. Similar cardiomediastinal silhouette. No evidence of acute osseous abnormality. Prior kyphoplasty in the region of the thoracolumbar junction, not well characterized. Cholecystectomy clips. Similar elevation of the right hemidiaphragm. IMPRESSION: 1. Similar patchy bilateral lung opacities, favor multifocal pneumonia over edema. 2. Similar versus slightly increased small left greater than right pleural effusions. Electronically Signed   By: FMargaretha SheffieldM.D.   On: 05/13/2021 06:43   DG Chest Port 1 View  Result Date: 05/10/2021 CLINICAL DATA:  Abnormal chest x-ray. EXAM: PORTABLE CHEST 1 VIEW COMPARISON:  Radiograph 05/05/2021.  CT 05/08/2021 FINDINGS: Worsening lung aeration with progressive patchy opacities involving the right greater than left lung. Small pleural effusions have increased. Heart is normal in size, stable mediastinal contours. No pneumothorax. Again seen eventration of right hemidiaphragm. IMPRESSION: Worsening lung aeration with progressive patchy opacities involving the right greater than left lung. Findings may favor multifocal pneumonia over  pulmonary edema. Small bilateral pleural effusions. Electronically Signed   By: MKeith RakeM.D.   On: 05/10/2021 17:25   DG Chest Portable 1 View  Result Date: 05/05/2021 CLINICAL DATA:  Weakness, COVID positive EXAM: PORTABLE CHEST 1 VIEW COMPARISON:  02/09/2018 FINDINGS: Mild patchy opacities in the right upper lobe and right mid lung. Mild left basilar opacity, possibly atelectasis. No pleural effusion or pneumothorax. Partial eventration of the right hemidiaphragm. The heart is normal in size. Suspected healed right lateral clavicle fracture. IMPRESSION: Mild patchy opacities in the right lung, compatible with pneumonia  in this patient with known COVID. Electronically Signed   By: Julian Hy M.D.   On: 05/05/2021 21:22   ECHOCARDIOGRAM COMPLETE  Result Date: 05/10/2021    ECHOCARDIOGRAM REPORT   Patient Name:   MONDA CHASTAIN Date of Exam: 05/10/2021 Medical Rec #:  [id number]        Height:       63.0 in Accession #:    [id number]       Weight:       143.3 lb Date of Birth:  04-27-1935        BSA:          1.678 m Patient Age:    40 years         BP:           169/79 mmHg Patient Gender: F                HR:           99 bpm. Exam Location:  [facility] Procedure: 2D Echo and Strain Analysis Indications:     CHF Acute Systolic Y60.60  History:         Patient has prior history of Echocardiogram examinations, most                  recent 11/08/2018.  Sonographer:     Kathlen Brunswick RDCS Referring Phys:  [id number] Ottie Glazier Diagnosing Phys: Kate Sable MD  Sonographer Comments: Global longitudinal strain was attempted. IMPRESSIONS  1. Left ventricular ejection fraction, by estimation, is 55 to 60%. The left ventricle has normal function. The left ventricle has no regional wall motion abnormalities. Left ventricular diastolic parameters were normal.  2. Right ventricular systolic function is normal. The right ventricular size is normal.  3. The mitral valve is normal in structure. No evidence  of mitral valve regurgitation.  4. The aortic valve is tricuspid. Aortic valve regurgitation is not visualized.  5. The inferior vena cava is normal in size with greater than 50% respiratory variability, suggesting right atrial pressure of 3 mmHg. FINDINGS  Left Ventricle: Left ventricular ejection fraction, by estimation, is 55 to 60%. The left ventricle has normal function. The left ventricle has no regional wall motion abnormalities. Global longitudinal strain performed but not reported based on interpreter judgement due to suboptimal tracking. The left ventricular internal cavity size was normal in size. There is no left ventricular hypertrophy. Left ventricular diastolic parameters were normal. Right Ventricle: The right ventricular size is normal. No increase in right ventricular wall thickness. Right ventricular systolic function is normal. Left Atrium: Left atrial size was normal in size. Right Atrium: Right atrial size was normal in size. Pericardium: There is no evidence of pericardial effusion. Mitral Valve: The mitral valve is normal in structure. No evidence of mitral valve regurgitation. Tricuspid Valve: The tricuspid valve is normal in structure. Tricuspid valve regurgitation is mild. Aortic Valve: The aortic valve is tricuspid. Aortic valve regurgitation is not visualized. Aortic valve peak gradient measures 6.8 mmHg. Pulmonic Valve: The pulmonic valve was normal in structure. Pulmonic valve regurgitation is trivial. Aorta: The aortic root and ascending aorta are structurally normal, with no evidence of dilitation. Venous: The inferior vena cava is normal in size with greater than 50% respiratory variability, suggesting right atrial pressure of 3 mmHg. IAS/Shunts: No atrial level shunt detected by color flow Doppler.  LEFT VENTRICLE PLAX 2D LVIDd:         3.50 cm  Diastology LVIDs:  2.50 cm  LV e' medial:    14.10 cm/s LV PW:         1.00 cm  LV E/e' medial:  7.6 LV IVS:        1.00 cm  LV e'  lateral:   15.30 cm/s LVOT diam:     1.90 cm  LV E/e' lateral: 7.0 LV SV:         53 LV SV Index:   32 LVOT Area:     2.84 cm  RIGHT VENTRICLE RV Basal diam:  2.90 cm RV S prime:     15.10 cm/s TAPSE (M-mode): 2.1 cm LEFT ATRIUM             Index       RIGHT ATRIUM          Index LA diam:        3.00 cm 1.79 cm/m  RA Area:     9.68 cm LA Vol (A2C):   22.6 ml 13.47 ml/m RA Volume:   20.40 ml 12.16 ml/m LA Vol (A4C):   20.6 ml 12.28 ml/m LA Biplane Vol: 22.5 ml 13.41 ml/m  AORTIC VALVE                PULMONIC VALVE AV Area (Vmax): 2.11 cm    PV Vmax:       0.95 m/s AV Vmax:        130.00 cm/s PV Peak grad:  3.6 mmHg AV Peak Grad:   6.8 mmHg LVOT Vmax:      96.60 cm/s LVOT Vmean:     59.600 cm/s LVOT VTI:       0.187 m  AORTA Ao Root diam: 2.90 cm Ao Asc diam:  3.20 cm MITRAL VALVE                TRICUSPID VALVE MV Area (PHT): 5.16 cm     TV Peak grad:   27.1 mmHg MV Decel Time: 147 msec     TV Vmax:        2.61 m/s MV E velocity: 107.00 cm/s MV A velocity: 33.40 cm/s   SHUNTS MV E/A ratio:  3.20         Systemic VTI:  0.19 m                             Systemic Diam: 1.90 cm Kate Sable MD Electronically signed by Kate Sable MD Signature Date/Time: 05/10/2021/3:32:19 PM    Final       Subjective: Feeling more strength.  Still some cough and fatigue, but improving.  No fever, no confusion.  No bleeding.  Discharge Exam: Vitals:   05/13/21 1018 05/13/21 1215  BP:  (!) 112/57  Pulse:  90  Resp:  18  Temp:  99.2 F (37.3 C)  SpO2: 94% 97%   Vitals:   05/13/21 1015 05/13/21 1017 05/13/21 1018 05/13/21 1215  BP:    (!) 112/57  Pulse:    90  Resp:    18  Temp:    99.2 F (37.3 C)  TempSrc:      SpO2: (!) 88% (!) 84% 94% 97%  Weight:      Height:        General: Pt is alert, awake, not in acute distress Cardiovascular: RRR, nl S1-S2, no murmurs appreciated.   No LE edema.   Respiratory: Normal respiratory rate and rhythm.  CTAB without rales or wheezes. Abdominal:  Abdomen  soft and non-tender.  No distension or HSM.   Neuro/Psych: Strength symmetric in upper and lower extremities.  Judgment and insight appear normal.   The results of significant diagnostics from this hospitalization (including imaging, microbiology, ancillary and laboratory) are listed below for reference.     Microbiology: Recent Results (from the past 240 hour(s))  Resp Panel by RT-PCR (Flu A&B, Covid) Nasopharyngeal Swab     Status: Abnormal   Collection Time: 05/05/21  9:49 PM   Specimen: Nasopharyngeal Swab; Nasopharyngeal(NP) swabs in vial transport medium  Result Value Ref Range Status   SARS Coronavirus 2 by RT PCR POSITIVE (A) NEGATIVE Final    Comment: RESULT CALLED TO, READ BACK BY AND VERIFIED WITH: SANTIA [id number]  05/05/21 RH (NOTE) SARS-CoV-2 target nucleic acids are DETECTED.  The SARS-CoV-2 RNA is generally detectable in upper respiratory specimens during the acute phase of infection. Positive results are indicative of the presence of the identified virus, but do not rule out bacterial infection or co-infection with other pathogens not detected by the test. Clinical correlation with patient history and other diagnostic information is necessary to determine patient infection status. The expected result is Negative.  Fact Sheet for Patients: EntrepreneurPulse.com.au  Fact Sheet for Healthcare Providers: IncredibleEmployment.be  This test is not yet approved or cleared by the [place] FDA and  has been authorized for detection and/or diagnosis of SARS-CoV-2 by FDA under an Emergency Use Authorization (EUA).  This EUA will remain in effect (meaning this test can be [place] ed) for the duration of  the COVID-19 declaration under Section 564(b)(1) of the Act, 21 U.S.C. section 360bbb-3(b)(1), unless the authorization is terminated or revoked sooner.     Influenza A by PCR NEGATIVE NEGATIVE Final   Influenza B by PCR NEGATIVE  NEGATIVE Final    Comment: (NOTE) The Xpert Xpress SARS-CoV-2/FLU/RSV plus assay is intended as an aid in the diagnosis of influenza from Nasopharyngeal swab specimens and should not be used as a sole basis for treatment. Nasal washings and aspirates are unacceptable for Xpert Xpress SARS-CoV-2/FLU/RSV testing.  Fact Sheet for Patients: EntrepreneurPulse.com.au  Fact Sheet for Healthcare Providers: IncredibleEmployment.be  This test is not yet approved or cleared by the [place] FDA and has been authorized for detection and/or diagnosis of SARS-CoV-2 by FDA under an Emergency Use Authorization (EUA). This EUA will remain in effect (meaning this test can be used) for the duration of the COVID-19 declaration under Section 564(b)(1) of the Act, 21 U.S.C. section 360bbb-3(b)(1), unless the authorization is terminated or revoked.  Performed at [facility], [address] [place], [place] [address]   Blood culture (routine x 2)     Status: None   Collection Time: 05/05/21 10:18 PM   Specimen: BLOOD  Result Value Ref Range Status   Specimen Description BLOOD RIGHT HAND  Final   Special Requests   Final    BOTTLES DRAWN AEROBIC AND ANAEROBIC Blood Culture adequate volume   Culture   Final    NO GROWTH 5 DAYS Performed at [facility], [address], [place], [place] [address]    Report Status 05/10/2021 FINAL  Final  Blood culture (routine x 2)     Status: None   Collection Time: 05/05/21 11:17 PM   Specimen: BLOOD  Result Value Ref Range Status   Specimen Description BLOOD RIGHT ARM  Final   Special Requests   Final    BOTTLES DRAWN AEROBIC AND ANAEROBIC Blood Culture adequate volume  Culture   Final    NO GROWTH 5 DAYS Performed at [facility], [address] [place], [place] [address]    Report Status 05/10/2021 FINAL  Final  MRSA Next Gen by PCR, Nasal     Status: None   Collection Time: 05/07/21   9:16 AM   Specimen: Nasal Mucosa; Nasal Swab  Result Value Ref Range Status   MRSA by PCR Next Gen NOT DETECTED NOT DETECTED Final    Comment: (NOTE) The GeneXpert MRSA Assay (FDA approved for NASAL specimens only), is one component of a comprehensive MRSA colonization surveillance program. It is not intended to diagnose MRSA infection nor to guide or monitor treatment for MRSA infections. Test performance is not FDA approved in patients less than 56 years old. Performed at [facility], [place], [place] [address]   Expectorated Sputum Assessment w Gram Stain, Rflx to Resp Cult     Status: None   Collection Time: 05/07/21  1:18 PM   Specimen: Sputum  Result Value Ref Range Status   Specimen Description SPUTUM  Final   Special Requests NONE  Final   Sputum evaluation   Final    Sputum specimen not acceptable for testing.  Please recollect.   RESULT CALLED TO, READ BACK BY AND VERIFIED WITH: Pat Patrick 05/07/21 1411 KLW Performed at [facility], [address] [place], [place] [address]    Report Status 05/07/2021 FINAL  Final  Respiratory (~20 pathogens) panel by PCR     Status: None   Collection Time: 05/07/21  4:59 PM   Specimen: Nasopharyngeal Swab; Respiratory  Result Value Ref Range Status   Adenovirus NOT DETECTED NOT DETECTED Final   Coronavirus 229E NOT DETECTED NOT DETECTED Final    Comment: (NOTE) The Coronavirus on the Respiratory Panel, DOES NOT test for the novel  Coronavirus (2019 nCoV)    Coronavirus HKU1 NOT DETECTED NOT DETECTED Final   Coronavirus NL63 NOT DETECTED NOT DETECTED Final   Coronavirus OC43 NOT DETECTED NOT DETECTED Final   Metapneumovirus NOT DETECTED NOT DETECTED Final   Rhinovirus / Enterovirus NOT DETECTED NOT DETECTED Final   Influenza A NOT DETECTED NOT DETECTED Final   Influenza B NOT DETECTED NOT DETECTED Final   Parainfluenza Virus 1 NOT DETECTED NOT DETECTED Final   Parainfluenza Virus 2 NOT  DETECTED NOT DETECTED Final   Parainfluenza Virus 3 NOT DETECTED NOT DETECTED Final   Parainfluenza Virus 4 NOT DETECTED NOT DETECTED Final   Respiratory Syncytial Virus NOT DETECTED NOT DETECTED Final   Bordetella pertussis NOT DETECTED NOT DETECTED Final   Bordetella Parapertussis NOT DETECTED NOT DETECTED Final   Chlamydophila pneumoniae NOT DETECTED NOT DETECTED Final   Mycoplasma pneumoniae NOT DETECTED NOT DETECTED Final    Comment: Performed at [facility], [address], [place], [place] [address]  Expectorated Sputum Assessment w Gram Stain, Rflx to Resp Cult     Status: None   Collection Time: 05/07/21  7:19 PM   Specimen: Expectorated Sputum  Result Value Ref Range Status   Specimen Description EXPECTORATED SPUTUM  Final   Special Requests NONE  Final   Sputum evaluation   Final    THIS SPECIMEN IS ACCEPTABLE FOR SPUTUM CULTURE Performed at [facility], [address] [place], [place] [address]    Report Status 05/07/2021 FINAL  Final  Culture, Respiratory w Gram Stain     Status: None   Collection Time: 05/07/21  7:19 PM  Result Value Ref Range Status   Specimen Description   Final    EXPECTORATED SPUTUM Performed at [facility], [address] [place], [place] [address]    Special Requests   Final    NONE Reflexed from [phone] Performed at [place], [place] [address]    Gram Stain   Final    NO WBC SEEN RARE GRAM POSITIVE COCCI IN PAIRS RARE GRAM POSITIVE RODS RARE BUDDING YEAST SEEN Performed at [facility], [place] [address], [place], [place] [address]    Culture RARE CANDIDA ALBICANS  Final   Report Status 05/10/2021 FINAL  Final     Labs: BNP (last 3 results) Recent Labs    05/09/21 0919  BNP 56.3   Basic Metabolic Panel: Recent Labs  Lab 05/07/21 0551 05/08/21 0436 05/09/21 0740 05/10/21 0620 05/11/21 0511  NA 130* 130* 137 134* 133*  K 3.9 4.1 4.7 4.1 3.8  CL 99 100  103 103 97*  CO2 _0 GLUCOSE 94 103* 176* 133* 133*  BUN 15 17 41* 12 23  CREATININE 0.67 0.82 1.06* 0.65 0.75  CALCIUM 7.6* 7.7* 10.3 8.0* 8.4*  MG 1.9  --   --   --   --    Liver Function Tests: Recent Labs  Lab 05/08/21 0436 05/11/21 0511  AST 22 23  ALT 8 8  ALKPHOS 37* 49  BILITOT 0.6 0.7  PROT 4.6* 5.0*  ALBUMIN 2.2* 2.3*   No results for input(s): LIPASE, AMYLASE in the last 168 hours. No results for input(s): AMMONIA in the last 168 hours. CBC: Recent Labs  Lab 05/09/21 1201 05/10/21 0620 05/10/21 1849 05/11/21 0511 05/12/21 0458  WBC 2.8* 3.2* 3.1* 3.0* 4.3  NEUTROABS  --   --  2.4  --   --   HGB 6.9* 8.6* 9.4* 8.8* 9.2*  HCT 19.4* 23.8* 26.5* 24.6* 26.3*  MCV 107.2* 101.3* 99.3 100.0 98.5  PLT 153 140* 140* 136* 171   Cardiac Enzymes: No results for input(s): CKTOTAL, CKMB, CKMBINDEX, TROPONINI in the last 168 hours. BNP: Invalid input(s): POCBNP CBG: No results for input(s): GLUCAP in the last 168 hours. D-Dimer No results for input(s): DDIMER in the last 72 hours. Hgb A1c No results for input(s): HGBA1C in the last 72 hours. Lipid Profile No results for input(s): CHOL, HDL, LDLCALC, TRIG, CHOLHDL, LDLDIRECT in the last 72 hours. Thyroid function studies No results for input(s): TSH, T4TOTAL, T3FREE, THYROIDAB in the last 72 hours.  Invalid input(s): FREET3 Anemia work up No results for input(s): VITAMINB12, FOLATE, FERRITIN, TIBC, IRON, RETICCTPCT in the last 72 hours. Urinalysis    Component Value Date/Time   COLORURINE YELLOW (A) 05/08/2021 1810   APPEARANCEUR CLEAR (A) 05/08/2021 1810   LABSPEC 1.044 (H) 05/08/2021 1810   PHURINE 5.0 05/08/2021 1810   GLUCOSEU NEGATIVE 05/08/2021 1810   GLUCOSEU NEGATIVE 05/11/2017 1143   HGBUR NEGATIVE 05/08/2021 1810   BILIRUBINUR NEGATIVE 05/08/2021 1810   BILIRUBINUR negative 07/04/2018 1134   KETONESUR 5 (A) 05/08/2021 1810   PROTEINUR NEGATIVE 05/08/2021 1810   UROBILINOGEN 0.2  07/04/2018 1134   UROBILINOGEN 0.2 05/11/2017 1143   NITRITE NEGATIVE 05/08/2021 1810   LEUKOCYTESUR NEGATIVE 05/08/2021 1810   Sepsis Labs Invalid input(s): PROCALCITONIN,  WBC,  LACTICIDVEN Microbiology Recent Results (from the past 240 hour(s))  Resp Panel by RT-PCR (Flu A&B, Covid) Nasopharyngeal Swab     Status: Abnormal   Collection Time:  05/05/21  9:49 PM   Specimen: Nasopharyngeal Swab; Nasopharyngeal(NP) swabs in vial transport medium  Result Value Ref Range Status   SARS Coronavirus 2 by RT PCR POSITIVE (A) NEGATIVE Final    Comment: RESULT CALLED TO, READ BACK BY AND VERIFIED WITH: SANTIA [email or web address]  05/05/21 RH (NOTE) SARS-CoV-2 target nucleic acids are DETECTED.  The SARS-CoV-2 RNA is generally detectable in upper respiratory specimens during the acute phase of infection. Positive results are indicative of the presence of the identified virus, but do not rule out bacterial infection or co-infection with other pathogens not detected by the test. Clinical correlation with patient history and other diagnostic information is necessary to determine patient infection status. The expected result is Negative.  Fact Sheet for Patients: EntrepreneurPulse.com.au  Fact Sheet for Healthcare Providers: IncredibleEmployment.be  This test is not yet approved or cleared by the [place] FDA and  has been authorized for detection and/or diagnosis of SARS-CoV-2 by FDA under an Emergency Use Authorization (EUA).  This EUA will remain in effect (meaning this test can be [place] ed) for the duration of  the COVID-19 declaration under Section 564(b)(1) of the Act, 21 U.S.C. section 360bbb-3(b)(1), unless the authorization is terminated or revoked sooner.     Influenza A by PCR NEGATIVE NEGATIVE Final   Influenza B by PCR NEGATIVE NEGATIVE Final    Comment: (NOTE) The Xpert Xpress SARS-CoV-2/FLU/RSV plus assay is intended as an aid in the diagnosis  of influenza from Nasopharyngeal swab specimens and should not be used as a sole basis for treatment. Nasal washings and aspirates are unacceptable for Xpert Xpress SARS-CoV-2/FLU/RSV testing.  Fact Sheet for Patients: EntrepreneurPulse.com.au  Fact Sheet for Healthcare Providers: IncredibleEmployment.be  This test is not yet approved or cleared by the [place] FDA and has been authorized for detection and/or diagnosis of SARS-CoV-2 by FDA under an Emergency Use Authorization (EUA). This EUA will remain in effect (meaning this test can be used) for the duration of the COVID-19 declaration under Section 564(b)(1) of the Act, 21 U.S.C. section 360bbb-3(b)(1), unless the authorization is terminated or revoked.  Performed at [facility], [address] [place], [place] [address]   Blood culture (routine x 2)     Status: None   Collection Time: 05/05/21 10:18 PM   Specimen: BLOOD  Result Value Ref Range Status   Specimen Description BLOOD RIGHT HAND  Final   Special Requests   Final    BOTTLES DRAWN AEROBIC AND ANAEROBIC Blood Culture adequate volume   Culture   Final    NO GROWTH 5 DAYS Performed at [facility] [address], [place], [place] [address]    Report Status 05/10/2021 FINAL  Final  Blood culture (routine x 2)     Status: None   Collection Time: 05/05/21 11:17 PM   Specimen: BLOOD  Result Value Ref Range Status   Specimen Description BLOOD RIGHT ARM  Final   Special Requests   Final    BOTTLES DRAWN AEROBIC AND ANAEROBIC Blood Culture adequate volume   Culture   Final    NO GROWTH 5 DAYS Performed at [facility], [address] [place], [place] [address]    Report Status 05/10/2021 FINAL  Final  MRSA Next Gen by PCR, Nasal     Status: None   Collection Time: 05/07/21  9:16 AM   Specimen: Nasal Mucosa; Nasal Swab  Result Value Ref Range Status   MRSA by PCR Next Gen NOT DETECTED NOT  DETECTED  Final    Comment: (NOTE) The GeneXpert MRSA Assay (FDA approved for NASAL specimens only), is one component of a comprehensive MRSA colonization surveillance program. It is not intended to diagnose MRSA infection nor to guide or monitor treatment for MRSA infections. Test performance is not FDA approved in patients less than 1 years old. Performed at [facility] [place], [place] [address]   Expectorated Sputum Assessment w Gram Stain, Rflx to Resp Cult     Status: None   Collection Time: 05/07/21  1:18 PM   Specimen: Sputum  Result Value Ref Range Status   Specimen Description SPUTUM  Final   Special Requests NONE  Final   Sputum evaluation   Final    Sputum specimen not acceptable for testing.  Please recollect.   RESULT CALLED TO, READ BACK BY AND VERIFIED WITH: Pat Patrick 05/07/21 1411 KLW Performed at [facility], [address] [place], [place] [address]    Report Status 05/07/2021 FINAL  Final  Respiratory (~20 pathogens) panel by PCR     Status: None   Collection Time: 05/07/21  4:59 PM   Specimen: Nasopharyngeal Swab; Respiratory  Result Value Ref Range Status   Adenovirus NOT DETECTED NOT DETECTED Final   Coronavirus 229E NOT DETECTED NOT DETECTED Final    Comment: (NOTE) The Coronavirus on the Respiratory Panel, DOES NOT test for the novel  Coronavirus (2019 nCoV)    Coronavirus HKU1 NOT DETECTED NOT DETECTED Final   Coronavirus NL63 NOT DETECTED NOT DETECTED Final   Coronavirus OC43 NOT DETECTED NOT DETECTED Final   Metapneumovirus NOT DETECTED NOT DETECTED Final   Rhinovirus / Enterovirus NOT DETECTED NOT DETECTED Final   Influenza A NOT DETECTED NOT DETECTED Final   Influenza B NOT DETECTED NOT DETECTED Final   Parainfluenza Virus 1 NOT DETECTED NOT DETECTED Final   Parainfluenza Virus 2 NOT DETECTED NOT DETECTED Final   Parainfluenza Virus 3 NOT DETECTED NOT DETECTED Final   Parainfluenza Virus 4 NOT DETECTED  NOT DETECTED Final   Respiratory Syncytial Virus NOT DETECTED NOT DETECTED Final   Bordetella pertussis NOT DETECTED NOT DETECTED Final   Bordetella Parapertussis NOT DETECTED NOT DETECTED Final   Chlamydophila pneumoniae NOT DETECTED NOT DETECTED Final   Mycoplasma pneumoniae NOT DETECTED NOT DETECTED Final    Comment: Performed at [facility], [address], [place], [place] [address]  Expectorated Sputum Assessment w Gram Stain, Rflx to Resp Cult     Status: None   Collection Time: 05/07/21  7:19 PM   Specimen: Expectorated Sputum  Result Value Ref Range Status   Specimen Description EXPECTORATED SPUTUM  Final   Special Requests NONE  Final   Sputum evaluation   Final    THIS SPECIMEN IS ACCEPTABLE FOR SPUTUM CULTURE Performed at [facility], [address] [place], [place] [address]    Report Status 05/07/2021 FINAL  Final  Culture, Respiratory w Gram Stain     Status: None   Collection Time: 05/07/21  7:19 PM  Result Value Ref Range Status   Specimen Description   Final    EXPECTORATED SPUTUM Performed at [facility], [address], [place], [place] [address]    Special Requests   Final    NONE Reflexed from [phone] Performed at [facility], [address] [place],  [address]    Gram Stain   Final    NO WBC SEEN RARE GRAM POSITIVE COCCI IN PAIRS RARE  GRAM POSITIVE RODS RARE BUDDING YEAST SEEN Performed at [facility], [place] [address], [place], [place] [address]    Culture RARE CANDIDA ALBICANS  Final   Report Status 05/10/2021 FINAL  Final     Time coordinating discharge: 35 minutes The [place] controlled substances registry was reviewed for this patient    30 Day Unplanned Readmission Risk Score    Flowsheet Row ED to Hosp-Admission (Discharged) from 05/05/2021 in [place] (1C)  30 Day Unplanned Readmission Risk Score (%) 20.5 Filed at 05/13/2021 1200       This score is the  patient's risk of an unplanned readmission within 30 days of being discharged (0 -100%). The score is based on dignosis, age, lab data, medications, orders, and past utilization.   Low:  0-14.9   Medium: 15-21.9   High: 22-29.9   Extreme: 30 and above            SIGNED:   Edwin Dada, MD  [facility] 05/13/2021, 5:11 PM

## 2021-05-13 NOTE — Progress Notes (Signed)
Pulmonary Medicine          Date: 05/13/2021,   MRN# [id number] Kim Santiago 1935/04/10     AdmissionWeight: 65 kg                 CurrentWeight: 65 kg   Referring physician: Dr. Loleta Books   CHIEF COMPLAINT:   Refractory pneumonia with with complex comorbid history   HISTORY OF PRESENT ILLNESS   This is a pleasant 86 year old female she has a history of osteoarthritis, breast cancer with left mastectomy in 2017 as well as lymph node resection, chronic kidney disease, history of MI, essential hypertension, hypothyroidism post thyroidectomy due to thyroid cancer, has a history of lupus with Sjogren's overlap, she is postmenopausal, she does have osteoarthritis, systolic heart failure with ejection fraction of 35%, who initially came into the emergency room with complaints of malaise post COVID-19 4 weeks ago.  She does have residual cough and states that intermittent expectoration of phlegm with yellow discoloration is ongoing with possible progression.  She denied GI symptoms specifically nausea vomiting and diarrhea was absent, she denied chest pain falls, confusion, seizure activity, visual impairment.  She did have immunization with COVID-19 with a total of 3 doses.  She was tachycardic on arrival with hyponatremia at 129 mild lactic acidosis chronic anemia with a hemoglobin of 9 and had another COVID-19 test when she came in which was positive still from previous.  For her systemic inflammatory autoimmune disease she does take methotrexate 15 mg once weekly.  Respiratory cultures were done which are negative at this time, also patient had non-COVID respiratory viral panel with 20 organisms reviewed all negative.  There is serum Fungitell in process. Patient remains febrile. Procalcitonin was also done which was essentially normal.  PCCM consultation placed for additional evaluation management.   05/10/21- Patient evaluated at bedside. She had TTE which is essentially normal, BNP  also wnl so unlikely cardiac dysfunction playing a role here. We discussed gentle diuresis today and continued bronchopulmonary hyginene and PT as previous.  Will dc IVF today as patient does have mild pedal edema now and pulmonary congestion. Repeat CXR. Pathology PBF due to relative tricytopenia. Kim Santiago her daugher was present and shares patient has been weaker post CVA 2019. She would benefit from PT  05/11/21- patient reports clinical improvement.  She had loose stools with improvement today.  WE are weaning Oxygen therapy and patient is eating now. Urine output is not documented but wall suction container had 1Liter. Solumderol decreased to '20mg'$  today. CXR reviewed with multifocal pneumonia. Patient had no fever overnight. Continue lasix 20 bid  05/12/21- patient feels improved.  She did require O2 again but shares overall shes doing well and ambulated with PT in room yesterday. Plan for dc tommorow.    05/13/21- patient is improved she had OT today with normoxia.  She may need low dose O2 for few months.  She may be dcd home from pulmonary perspective with outpatient follow up.  Discussed with patient this morning. CXR reviewed interval stable.     PAST MEDICAL HISTORY   Past Medical History:  Diagnosis Date   Arthritis    Breast cancer ([place]) 2017   left mastectomy done 11/2015   Breast cancer in female [facility]) 11/18/2015   Left: 3.9 cm tumor, T2, 1/2 sentinel nodes positive for macro metastatic disease, N1, 3 negative nodes in the axillary tail, ER+,PR+, Her 2 neu, low Mammoprint score   Cancer (HCC)    thyroid  takes levothyroxine   Chronic kidney disease    UTI   Genetic screening March 2017.   Mammoprint of left breast cancer: Low risk for recurrence.    History of heart attack 06/12/2014   Hypertension    hypothyroidism    secondary to thyroidectomy for thyroid ca   Hypothyroidism    Lupus ([place])    subcutaneous   Menopause 40s   natural, hot flashes and mood lability now gone, off  prempro 7 months   Myocardial infarction [facility]) 2013   Osteoporosis    Osteopenia   Rosacea    Sjoegren syndrome    Stress-induced cardiomyopathy September of 2013   EF 35%. Peak troponin was 1.8.     SURGICAL HISTORY   Past Surgical History:  Procedure Laterality Date   BACK SURGERY     BREAST BIOPSY Left 10/30/15   positive, done in Dr. Dwyane Luo office   CARDIAC CATHETERIZATION  05/2012   [place]. No significant CAD. Ejection fraction of 35% due to stress-induced cardiomyopathy.   CHOLECYSTECTOMY     COLONOSCOPY     DILATION AND CURETTAGE OF UTERUS     KYPHOSIS SURGERY  Feb 2008   L1, Dr. Mauri Pole   LUMBAR DISC SURGERY     L4-L5   MASTECTOMY Left 11/18/2015   positive   SENTINEL NODE BIOPSY Left 11/18/2015   Procedure: SENTINEL NODE BIOPSY;  Surgeon: Robert Bellow, MD;  Location: [facility] ORS;  Service: General;  Laterality: Left;   SHOULDER ARTHROSCOPY  2004   Left, Dr. Jefm Bryant   SIMPLE MASTECTOMY WITH AXILLARY SENTINEL NODE BIOPSY Left 11/18/2015   Procedure: SIMPLE MASTECTOMY;  Surgeon: Robert Bellow, MD;  Location: [facility] ORS;  Service: General;  Laterality: Left;   SPINE SURGERY     L4-5 diskectomy   THYROIDECTOMY     Thyroid Cancer   TONSILLECTOMY     TUBAL LIGATION       FAMILY HISTORY   Family History  Problem Relation Age of Onset   Kidney disease Mother    Heart disease Mother    COPD Father    Cancer Father        esophageal   Kidney disease Sister    Cancer Brother 43       colon cancer (both brothers)   Heart attack Brother 60   Heart disease Brother    Breast cancer Neg Hx      SOCIAL HISTORY   Social History   Tobacco Use   Smoking status: Never   Smokeless tobacco: Never  Vaping Use   Vaping Use: Never used  Substance Use Topics   Alcohol use: No   Drug use: No     MEDICATIONS    Home Medication:    Current Medication:  Current Facility-Administered Medications:    acetaminophen (TYLENOL) tablet 650 mg, 650 mg, Oral, Q6H  PRN, 650 mg at 05/09/21 1546 **OR** acetaminophen (TYLENOL) suppository 650 mg, 650 mg, Rectal, Q6H PRN, Mansy, Jan A, MD   acidophilus (RISAQUAD) capsule 1 capsule, 1 capsule, Oral, Daily, Mansy, Jan A, MD, 1 capsule at 05/12/21 0851   albuterol (VENTOLIN HFA) 108 (90 Base) MCG/ACT inhaler 2 puff, 2 puff, Inhalation, Q6H, Kirah Stice, MD, 2 puff at 05/13/21 0309   atorvastatin (LIPITOR) tablet 20 mg, 20 mg, Oral, Daily, Mansy, Jan A, MD, 20 mg at 05/12/21 [id number]   calcium-vitamin D (OSCAL WITH D) 500-200 MG-UNIT per tablet 2 tablet, 2 tablet, Oral, Daily, Mansy, Jan A, MD, 2 tablet at 05/12/21  0852   cholecalciferol (VITAMIN D3) tablet 2,000 Units, 2,000 Units, Oral, Daily, Mansy, Jan A, MD, 2,000 Units at 05/12/21 0853   enoxaparin (LOVENOX) injection 40 mg, 40 mg, Subcutaneous, Q24H, Mansy, Jan A, MD, 40 mg at [id number]   folic acid (FOLVITE) tablet 1 mg, 1 mg, Oral, Daily, Mansy, Jan A, MD, 1 mg at 05/12/21 [id number]   guaiFENesin (MUCINEX) 12 hr tablet 600 mg, 600 mg, Oral, BID, Mansy, Jan A, MD, 600 mg at 05/12/21 2117   ibuprofen (ADVIL) tablet 400 mg, 400 mg, Oral, Q6H PRN, Edwin Dada, MD, 400 mg at 05/09/21 1815   levothyroxine (SYNTHROID) tablet 75 mcg, 75 mcg, Oral, Q0600, Mansy, Jan A, MD, 75 mcg at 05/13/21 [id number]   losartan (COZAAR) tablet 25 mg, 25 mg, Oral, Daily, Mansy, Jan A, MD, 25 mg at 05/12/21 [id number]   magnesium hydroxide (MILK OF MAGNESIA) suspension 30 mL, 30 mL, Oral, Daily PRN, Mansy, Jan A, MD   methylPREDNISolone sodium succinate (SOLU-MEDROL) 40 mg/mL injection 20 mg, 20 mg, Intravenous, Daily, Lanney Gins, Jerone Cudmore, MD, 20 mg at 05/12/21 0856   metoprolol succinate (TOPROL-XL) 24 hr tablet 25 mg, 25 mg, Oral, Daily, Mansy, Jan A, MD, 25 mg at 05/12/21 [id number]   nitroGLYCERIN (NITROSTAT) SL tablet 0.4 mg, 0.4 mg, Sublingual, Q5 min PRN, Mansy, Jan A, MD   ondansetron (ZOFRAN) tablet 4 mg, 4 mg, Oral, Q6H PRN **OR** ondansetron (ZOFRAN) injection 4 mg, 4 mg, Intravenous,  Q6H PRN, Mansy, Jan A, MD, 4 mg at 05/08/21 [id number]   pantoprazole (PROTONIX) EC tablet 40 mg, 40 mg, Oral, Daily, Mansy, Jan A, MD, 40 mg at 05/12/21 [id number]   polyethylene glycol (MIRALAX / GLYCOLAX) packet 17 g, 17 g, Oral, Daily, Mansy, Jan A, MD, 17 g at 05/12/21 [id number]   remdesivir 100 mg in sodium chloride 0.9 % 100 mL IVPB, 100 mg, Intravenous, Daily, Danford, Suann Larry, MD, Last Rate: 200 mL/hr at 05/12/21 0954, 100 mg at 05/12/21 0954   sertraline (ZOLOFT) tablet 25 mg, 25 mg, Oral, Daily, Mansy, Jan A, MD, 25 mg at 05/12/21 [id number]   tiZANidine (ZANAFLEX) tablet 2 mg, 2 mg, Oral, TID PRN, Ezekiel Slocumb, DO   traZODone (DESYREL) tablet 25 mg, 25 mg, Oral, QHS PRN, Mansy, Jan A, MD   traZODone (DESYREL) tablet 50 mg, 50 mg, Oral, QHS, Mansy, Jan A, MD, 50 mg at 05/12/21 2117    ALLERGIES   Azathioprine, Hydroxychloroquine, Mycophenolate mofetil, Amoxicillin, Codeine, Naprosyn [naproxen], Orudis [ketoprofen], Sulfa antibiotics, and Sulfathiazole     REVIEW OF SYSTEMS    Review of Systems:  Gen:  Denies  fever, sweats, chills weigh loss  HEENT: Denies blurred vision, double vision, ear pain, eye pain, hearing loss, nose bleeds, sore throat Cardiac:  No dizziness, chest pain or heaviness, chest tightness,edema Resp:   Denies cough or sputum porduction, shortness of breath,wheezing, hemoptysis,  Gi: Denies swallowing difficulty, stomach pain, nausea or vomiting, diarrhea, constipation, bowel incontinence Gu:  Denies bladder incontinence, burning urine Ext:   Denies Joint pain, stiffness or swelling Skin: Denies  skin rash, easy bruising or bleeding or hives Endoc:  Denies polyuria, polydipsia , polyphagia or weight change Psych:   Denies depression, insomnia or hallucinations   Other:  All other systems negative   VS: BP (!) 125/55 (BP Location: Right Arm)   Pulse 99   Temp 99 F (37.2 C)   Resp 16   Ht '5\' 3"'$  (1.6 m)   Wt 65 kg   SpO2  94%   BMI 25.38 kg/m       PHYSICAL EXAM    GENERAL:NAD, no fevers, chills, no weakness no fatigue HEAD: Normocephalic, atraumatic.  EYES: Pupils equal, round, reactive to light. Extraocular muscles intact. No scleral icterus.  MOUTH: Moist mucosal membrane. Dentition intact. No abscess noted.  EAR, NOSE, THROAT: Clear without exudates. No external lesions.  NECK: Supple. No thyromegaly. No nodules. No JVD.  PULMONARY: clear to auscultation CARDIOVASCULAR: S1 and S2. Regular rate and rhythm. No murmurs, rubs, or gallops. No edema. Pedal pulses 2+ bilaterally.  GASTROINTESTINAL: Soft, nontender, nondistended. No masses. Positive bowel sounds. No hepatosplenomegaly.  MUSCULOSKELETAL: No swelling, clubbing, or edema. Range of motion full in all extremities.  NEUROLOGIC: Cranial nerves II through XII are intact. No gross focal neurological deficits. Sensation intact. Reflexes intact.  SKIN: No ulceration, lesions, rashes, or cyanosis. Skin warm and dry. Turgor intact.  PSYCHIATRIC: Mood, affect within normal limits. The patient is awake, alert and oriented x 3. Insight, judgment intact.       IMAGING    CT CHEST W CONTRAST  Result Date: 05/08/2021 CLINICAL DATA:  85 year old female with history of pneumonia noted on recent chest x-ray. COVID positive patient. Evaluate for pneumonia versus interstitial lung disease. History of Sjogren's disease and cutaneous lupus. EXAM: CT CHEST WITH CONTRAST TECHNIQUE: Multidetector CT imaging of the chest was performed during intravenous contrast administration. CONTRAST:  44m OMNIPAQUE IOHEXOL 350 MG/ML SOLN COMPARISON:  Chest CT 05/05/2021. FINDINGS: Cardiovascular: Heart size is normal. There is no significant pericardial fluid, thickening or pericardial calcification. There is aortic atherosclerosis, as well as atherosclerosis of the great vessels of the mediastinum and the coronary arteries, including calcified atherosclerotic plaque in the left anterior descending and  right coronary arteries. Calcifications of the mitral annulus. Mediastinum/Nodes: Multiple prominent borderline enlarged mediastinal lymph nodes are noted, nonspecific and likely reactive. No definite pathologically enlarged mediastinal or hilar lymph nodes. Small hiatal hernia. No axillary lymphadenopathy. Lungs/Pleura: Widespread but patchy areas of ground-glass attenuation, interlobular septal thickening and thickening of the peribronchovascular interstitium are noted throughout all aspects of the lungs bilaterally, indicative of severe multilobar bilateral bronchopneumonia, presumably secondary to reported COVID infection. Trace bilateral pleural effusions layer dependently. Upper Abdomen: Aortic atherosclerosis.  Status post cholecystectomy. Musculoskeletal: Status post left modified radical mastectomy. Chronic compression fracture of L1 with 25% loss of anterior vertebral body height and post vertebroplasty changes. There are no aggressive appearing lytic or blastic lesions noted in the visualized portions of the skeleton. IMPRESSION: 1. Severe multilobar bilateral bronchopneumonia, presumably secondary to reported COVID infection. 2. Trace bilateral pleural effusions. 3. Small hiatal hernia. 4. Aortic atherosclerosis, in addition to 2 vessel coronary artery disease. Aortic Atherosclerosis (ICD10-I70.0). Electronically Signed   By: DVinnie LangtonM.D.   On: 05/08/2021 16:15   DG Chest Port 1 View  Result Date: 05/13/2021 CLINICAL DATA:  Interstitial edema R60.9 (ICD-10-CM) EXAM: PORTABLE CHEST 1 VIEW COMPARISON:  05/10/2021. FINDINGS: Similar patchy bilateral airspace opacities, further characterized on prior CT chest. No visible pneumothorax. Similar versus slightly increased small left greater than right pleural effusions. Similar cardiomediastinal silhouette. No evidence of acute osseous abnormality. Prior kyphoplasty in the region of the thoracolumbar junction, not well characterized. Cholecystectomy  clips. Similar elevation of the right hemidiaphragm. IMPRESSION: 1. Similar patchy bilateral lung opacities, favor multifocal pneumonia over edema. 2. Similar versus slightly increased small left greater than right pleural effusions. Electronically Signed   By: FMargaretha SheffieldM.D.   On: 05/13/2021 06:43   DG  Chest Port 1 View  Result Date: 05/10/2021 CLINICAL DATA:  Abnormal chest x-ray. EXAM: PORTABLE CHEST 1 VIEW COMPARISON:  Radiograph 05/05/2021.  CT 05/08/2021 FINDINGS: Worsening lung aeration with progressive patchy opacities involving the right greater than left lung. Small pleural effusions have increased. Heart is normal in size, stable mediastinal contours. No pneumothorax. Again seen eventration of right hemidiaphragm. IMPRESSION: Worsening lung aeration with progressive patchy opacities involving the right greater than left lung. Findings may favor multifocal pneumonia over pulmonary edema. Small bilateral pleural effusions. Electronically Signed   By: Keith Rake M.D.   On: 05/10/2021 17:25   DG Chest Portable 1 View  Result Date: 05/05/2021 CLINICAL DATA:  Weakness, COVID positive EXAM: PORTABLE CHEST 1 VIEW COMPARISON:  02/09/2018 FINDINGS: Mild patchy opacities in the right upper lobe and right mid lung. Mild left basilar opacity, possibly atelectasis. No pleural effusion or pneumothorax. Partial eventration of the right hemidiaphragm. The heart is normal in size. Suspected healed right lateral clavicle fracture. IMPRESSION: Mild patchy opacities in the right lung, compatible with pneumonia in this patient with known COVID. Electronically Signed   By: Julian Hy M.D.   On: 05/05/2021 21:22   ECHOCARDIOGRAM COMPLETE  Result Date: 05/10/2021    ECHOCARDIOGRAM REPORT   Patient Name:   HENNESY ICENHOWER Date of Exam: 05/10/2021 Medical Rec #:  [id number]        Height:       63.0 in Accession #:    [id number]       Weight:       143.3 lb Date of Birth:  [birth year]        BSA:           1.678 m Patient Age:    3 years         BP:           169/79 mmHg Patient Gender: F                HR:           99 bpm. Exam Location:  [facility] Procedure: 2D Echo and Strain Analysis Indications:     CHF Acute Systolic [id number]  History:         Patient has prior history of Echocardiogram examinations, most                  recent 11/08/2018.  Sonographer:     Kathlen Brunswick RDCS Referring Phys:  [id number] Ottie Glazier Diagnosing Phys: Kate Sable MD  Sonographer Comments: Global longitudinal strain was attempted. IMPRESSIONS  1. Left ventricular ejection fraction, by estimation, is 55 to 60%. The left ventricle has normal function. The left ventricle has no regional wall motion abnormalities. Left ventricular diastolic parameters were normal.  2. Right ventricular systolic function is normal. The right ventricular size is normal.  3. The mitral valve is normal in structure. No evidence of mitral valve regurgitation.  4. The aortic valve is tricuspid. Aortic valve regurgitation is not visualized.  5. The inferior vena cava is normal in size with greater than 50% respiratory variability, suggesting right atrial pressure of 3 mmHg. FINDINGS  Left Ventricle: Left ventricular ejection fraction, by estimation, is 55 to 60%. The left ventricle has normal function. The left ventricle has no regional wall motion abnormalities. Global longitudinal strain performed but not reported based on interpreter judgement due to suboptimal tracking. The left ventricular internal cavity size was normal in size. There is no left ventricular hypertrophy. Left ventricular diastolic parameters  were normal. Right Ventricle: The right ventricular size is normal. No increase in right ventricular wall thickness. Right ventricular systolic function is normal. Left Atrium: Left atrial size was normal in size. Right Atrium: Right atrial size was normal in size. Pericardium: There is no evidence of pericardial effusion. Mitral Valve: The mitral  valve is normal in structure. No evidence of mitral valve regurgitation. Tricuspid Valve: The tricuspid valve is normal in structure. Tricuspid valve regurgitation is mild. Aortic Valve: The aortic valve is tricuspid. Aortic valve regurgitation is not visualized. Aortic valve peak gradient measures 6.8 mmHg. Pulmonic Valve: The pulmonic valve was normal in structure. Pulmonic valve regurgitation is trivial. Aorta: The aortic root and ascending aorta are structurally normal, with no evidence of dilitation. Venous: The inferior vena cava is normal in size with greater than 50% respiratory variability, suggesting right atrial pressure of 3 mmHg. IAS/Shunts: No atrial level shunt detected by color flow Doppler.  LEFT VENTRICLE PLAX 2D LVIDd:         3.50 cm  Diastology LVIDs:         2.50 cm  LV e' medial:    14.10 cm/s LV PW:         1.00 cm  LV E/e' medial:  7.6 LV IVS:        1.00 cm  LV e' lateral:   15.30 cm/s LVOT diam:     1.90 cm  LV E/e' lateral: 7.0 LV SV:         53 LV SV Index:   32 LVOT Area:     2.84 cm  RIGHT VENTRICLE RV Basal diam:  2.90 cm RV S prime:     15.10 cm/s TAPSE (M-mode): 2.1 cm LEFT ATRIUM             Index       RIGHT ATRIUM          Index LA diam:        3.00 cm 1.79 cm/m  RA Area:     9.68 cm LA Vol (A2C):   22.6 ml 13.47 ml/m RA Volume:   20.40 ml 12.16 ml/m LA Vol (A4C):   20.6 ml 12.28 ml/m LA Biplane Vol: 22.5 ml 13.41 ml/m  AORTIC VALVE                PULMONIC VALVE AV Area (Vmax): 2.11 cm    PV Vmax:       0.95 m/s AV Vmax:        130.00 cm/s PV Peak grad:  3.6 mmHg AV Peak Grad:   6.8 mmHg LVOT Vmax:      96.60 cm/s LVOT Vmean:     59.600 cm/s LVOT VTI:       0.187 m  AORTA Ao Root diam: 2.90 cm Ao Asc diam:  3.20 cm MITRAL VALVE                TRICUSPID VALVE MV Area (PHT): 5.16 cm     TV Peak grad:   27.1 mmHg MV Decel Time: 147 msec     TV Vmax:        2.61 m/s MV E velocity: 107.00 cm/s MV A velocity: 33.40 cm/s   SHUNTS MV E/A ratio:  3.20         Systemic VTI:  0.19  m  Systemic Diam: 1.90 cm Kate Sable MD Electronically signed by Kate Sable MD Signature Date/Time: 05/10/2021/3:32:19 PM    Final       ASSESSMENT/PLAN   Acute hypoxemic respiratory failure -Patient had COVID-19 4 weeks ago.  Due to relative immunocompromise state with methotrexate she is at high risk for long COVID syndrome, additionally cycle threshold value is 30.3 indicative of active replication of virus and ongoing infection.  Recommendation for repeated treatment and consideration of high titer convalescent plasma available for these cases -Agree with Fungitell testing as patient is at increased risk for fungal infection -Review of CT chest above does show bilateral pulmonary congestion with edema and atelectasis worse at the left lower lobe.  Patient has history of CHF with previous echo showing EF of 35 as well as another echo showing EF of 65 with diastolic dysfunction.  We will repeat transthoracic echo as well as obtain BNP today.  Patient may benefit from diuresis if possible. -Discussed with ID and placed consultation for High titer convalescent plasma -continue current therapy with solumedrol and Remdesevir.  PT/OT Fungitell in process, histoplasma still in process  Bibasilar atelectasis -upgrade recruitment to metaNEB with albuterol BID with RT -may not be able to do due to COVID precautions but can attempt VEST -continue IS and flutter      Chronic systolic and diastolic CHF-RESOLVED  S/P TTE 05/09/21- NORMAL   - patient with 1+ pedal edema   -will repeat TTE and BNP today  -BNP >300    Thank you for allowing me to participate in the care of this patient.    Patient/Family are satisfied with care plan and all questions have been answered.  This document was prepared using Dragon voice recognition software and may include unintentional dictation errors.     Ottie Glazier, M.D.  Division of [facility]

## 2021-05-13 NOTE — Progress Notes (Signed)
Pt being discharged home, discharge instructions reviewed with pt and daughter, states understanding, no complaints noted, o2 in place

## 2021-05-13 NOTE — Progress Notes (Signed)
Occupational Therapy Treatment Patient Details Name: Kim Santiago MRN: [id number] DOB: 02-Sep-1935 Today's Date: 05/13/2021    History of present illness 86 y.o.female with medical history significant for hypertension, hypothyroidism, cutaneous lupus, coronary artery disease, Sjogren's syndrome and breast cancer status post left mastectomy, who presented to the ER with acute onset of significant generalized weakness.  Was diagnosed with COVID-19 about 4 weeks prior to admission.   OT comments  Pt seen for OT treatment on this date. Upon arrival to room, pt awake, seated in semi-fowler's position in bed, and on 1L/min of O2 via [place]. Pt reporting no pain and agreeable to OT tx. Pt currently presents with decreased activity tolerance and decreased strength. Due to these functional impairments, pt requires SUPERVISION for seated LB dressing, SUPERVISION for standing grooming tasks, and SUPERVISION for functional mobility of short household distances (19f) with rollator.   Following bed mobility, SpO2 88-90% sitting EOB, with SpO2 able to increased to 92% within 30 sec of PLB. During functional mobility, SpO2 88% and SpO2 able to increased to 92% within 30 sec of PLB. At end of session, pt left seated upright in recliner with SpO2 93%. Pt continues to benefit from skilled OT services to maximize return to PLOF and minimize risk of future falls, injury, caregiver burden, and readmission. Will continue to follow POC. Discharge recommendation remains appropriate.     Follow Up Recommendations  Home health OT;Supervision - Intermittent    Equipment Recommendations  None recommended by OT       Precautions / Restrictions Precautions Precautions: Fall Restrictions Weight Bearing Restrictions: No       Mobility Bed Mobility Overal bed mobility: Modified Independent             General bed mobility comments: With use of bedrails, pt able to perform without physical assist     Transfers Overall transfer level: Needs assistance Equipment used: 4-wheeled walker Transfers: Sit to/from Stand Sit to Stand: Supervision         General transfer comment: Demonstrated safe hand placement and independently locked 4ww brakes    Balance Overall balance assessment: Needs assistance Sitting-balance support: No upper extremity supported;Feet supported   Sitting balance - Comments: good sitting balance reaching outside BOS to don/doff socks   Standing balance support: No upper extremity supported;During functional activity Standing balance-Leahy Scale: Good Standing balance comment: SUPERVISION for performing bimanual grooming tasks                           ADL either performed or assessed with clinical judgement   ADL Overall ADL's : Needs assistance/impaired     Grooming: Wash/dry hands;Wash/dry face;Supervision/safety;Set up Grooming Details (indicate cue type and reason): SUPERVISION d/t decreased endurance             Lower Body Dressing: Supervision/safety;Sitting/lateral leans Lower Body Dressing Details (indicate cue type and reason): to don/doff socks             Functional mobility during ADLs: Supervision/safety;Rolling walker        Cognition Arousal/Alertness: Awake/alert Behavior During Therapy: WFL for tasks assessed/performed Overall Cognitive Status: Within Functional Limits for tasks assessed                                          Exercises Other Exercises Other Exercises: Pt re-educated on energy conservation strategies including positioning,  pacing, and purse lip breathing, with pt verbalizing understanding      General Comments Pt on 1L of O2 via [place]. SpO2 88-90% sitting EOB and 88% during functional mobility of short household distance (able to increased to 92% following PLB). SpO2 93% at end of session with pt sitting upright in recliner    Pertinent Vitals/ Pain       Pain Assessment:  No/denies pain         Frequency  Min 1X/week        Progress Toward Goals  OT Goals(current goals can now be found in the care plan section)  Progress towards OT goals: Progressing toward goals  Acute Rehab OT Goals Patient Stated Goal: to go home OT Goal Formulation: With patient Time For Goal Achievement: 05/25/21 Potential to Achieve Goals: Good  Plan Discharge plan remains appropriate;Frequency remains appropriate       AM-PAC OT "6 Clicks" Daily Activity     Outcome Measure   Help from another person eating meals?: None Help from another person taking care of personal grooming?: A Little Help from another person toileting, which includes using toliet, bedpan, or urinal?: A Little Help from another person bathing (including washing, rinsing, drying)?: A Little Help from another person to put on and taking off regular upper body clothing?: None Help from another person to put on and taking off regular lower body clothing?: A Little 6 Click Score: 20    End of Session Equipment Utilized During Treatment: Rolling walker  OT Visit Diagnosis: Muscle weakness (generalized) (M62.81);History of falling (Z91.81)   Activity Tolerance Patient tolerated treatment well   Patient Left in chair;with call bell/phone within reach;with chair alarm set;Other (comment) (with MD present)   Nurse Communication Mobility status        Time: [id number] OT Time Calculation (min): 25 min  Charges: OT General Charges $OT Visit: 1 Visit OT Treatments $Self Care/Home Management : 8-22 mins $Therapeutic Activity: 8-22 mins  Fredirick Maudlin, OTR/L [place]

## 2021-05-13 NOTE — Care Management Important Message (Signed)
Important Message  Patient Details  Name: Kim Santiago MRN: [id number] Date of Birth: October 17, 1934   Medicare Important Message Given:  Yes  I reviewed the Important Message from Medicare with [name], daughter [phone]) by phone and she is in agreement with the discharge plan for today. I asked if she would like me to send her a copy and she replied, no.  I wished her mother well and thanked her for her time.   [place] 05/13/2021, 11:40 AM

## 2021-05-13 NOTE — Progress Notes (Signed)
SATURATION QUALIFICATIONS: (This note is used to comply with regulatory documentation for home oxygen)  Patient Saturations on Room Air at Rest = 88%  Patient Saturations on Room Air while Ambulating = 84%  Patient Saturations on 2 Liters of oxygen while Ambulating = 94%  Please briefly explain why patient needs home oxygen: Patient's oxygen levels dropped during ambulation on room air to 84%. Patient was also symptomatic of dyspneic on exertion without oxygen.

## 2021-05-14 DIAGNOSIS — K219 Gastro-esophageal reflux disease without esophagitis: Secondary | ICD-10-CM | POA: Diagnosis not present

## 2021-05-14 DIAGNOSIS — M1990 Unspecified osteoarthritis, unspecified site: Secondary | ICD-10-CM | POA: Diagnosis not present

## 2021-05-14 DIAGNOSIS — A419 Sepsis, unspecified organism: Secondary | ICD-10-CM | POA: Diagnosis not present

## 2021-05-14 DIAGNOSIS — Z7952 Long term (current) use of systemic steroids: Secondary | ICD-10-CM | POA: Diagnosis not present

## 2021-05-14 DIAGNOSIS — I5020 Unspecified systolic (congestive) heart failure: Secondary | ICD-10-CM | POA: Diagnosis not present

## 2021-05-14 DIAGNOSIS — F419 Anxiety disorder, unspecified: Secondary | ICD-10-CM | POA: Diagnosis not present

## 2021-05-14 DIAGNOSIS — I252 Old myocardial infarction: Secondary | ICD-10-CM | POA: Diagnosis not present

## 2021-05-14 DIAGNOSIS — M6281 Muscle weakness (generalized): Secondary | ICD-10-CM | POA: Diagnosis not present

## 2021-05-14 DIAGNOSIS — M3500 Sicca syndrome, unspecified: Secondary | ICD-10-CM | POA: Diagnosis not present

## 2021-05-14 DIAGNOSIS — E890 Postprocedural hypothyroidism: Secondary | ICD-10-CM | POA: Diagnosis not present

## 2021-05-14 DIAGNOSIS — R627 Adult failure to thrive: Secondary | ICD-10-CM | POA: Diagnosis not present

## 2021-05-14 DIAGNOSIS — N390 Urinary tract infection, site not specified: Secondary | ICD-10-CM | POA: Diagnosis not present

## 2021-05-14 DIAGNOSIS — I2510 Atherosclerotic heart disease of native coronary artery without angina pectoris: Secondary | ICD-10-CM | POA: Diagnosis not present

## 2021-05-14 DIAGNOSIS — L931 Subacute cutaneous lupus erythematosus: Secondary | ICD-10-CM | POA: Diagnosis not present

## 2021-05-14 DIAGNOSIS — Z87440 Personal history of urinary (tract) infections: Secondary | ICD-10-CM | POA: Diagnosis not present

## 2021-05-14 DIAGNOSIS — E871 Hypo-osmolality and hyponatremia: Secondary | ICD-10-CM | POA: Diagnosis not present

## 2021-05-14 DIAGNOSIS — Z9181 History of falling: Secondary | ICD-10-CM | POA: Diagnosis not present

## 2021-05-14 DIAGNOSIS — Z9981 Dependence on supplemental oxygen: Secondary | ICD-10-CM | POA: Diagnosis not present

## 2021-05-14 DIAGNOSIS — Z79899 Other long term (current) drug therapy: Secondary | ICD-10-CM | POA: Diagnosis not present

## 2021-05-14 DIAGNOSIS — J189 Pneumonia, unspecified organism: Secondary | ICD-10-CM | POA: Diagnosis not present

## 2021-05-14 DIAGNOSIS — M810 Age-related osteoporosis without current pathological fracture: Secondary | ICD-10-CM | POA: Diagnosis not present

## 2021-05-14 DIAGNOSIS — E785 Hyperlipidemia, unspecified: Secondary | ICD-10-CM | POA: Diagnosis not present

## 2021-05-14 DIAGNOSIS — U071 COVID-19: Secondary | ICD-10-CM | POA: Diagnosis not present

## 2021-05-14 DIAGNOSIS — J1282 Pneumonia due to coronavirus disease 2019: Secondary | ICD-10-CM | POA: Diagnosis not present

## 2021-05-14 DIAGNOSIS — L719 Rosacea, unspecified: Secondary | ICD-10-CM | POA: Diagnosis not present

## 2021-05-14 DIAGNOSIS — D539 Nutritional anemia, unspecified: Secondary | ICD-10-CM | POA: Diagnosis not present

## 2021-05-14 DIAGNOSIS — J9601 Acute respiratory failure with hypoxia: Secondary | ICD-10-CM | POA: Diagnosis not present

## 2021-05-14 DIAGNOSIS — I119 Hypertensive heart disease without heart failure: Secondary | ICD-10-CM | POA: Diagnosis not present

## 2021-05-14 DIAGNOSIS — Z853 Personal history of malignant neoplasm of breast: Secondary | ICD-10-CM | POA: Diagnosis not present

## 2021-05-14 LAB — MISC LABCORP TEST (SEND OUT): Labcorp test code: 160236

## 2021-05-14 LAB — METHYLMALONIC ACID, SERUM: Methylmalonic Acid, Quantitative: 244 nmol/L (ref 0–378)

## 2021-05-14 LAB — CMV DNA, QUANTITATIVE, PCR
CMV DNA Quant: POSITIVE IU/mL
Log10 CMV Qn DNA Pl: UNDETERMINED log10 IU/mL

## 2021-05-15 ENCOUNTER — Telehealth: Payer: Self-pay

## 2021-05-15 LAB — FUNGITELL, SERUM: Fungitell Result: <31 pg/mL (ref <80)

## 2021-05-15 NOTE — Telephone Encounter (Signed)
Transition Care Management Unsuccessful Follow-up Telephone Call  Date of discharge and from where:  05/13/2021-[facility]  Attempts:  2nd Attempt  Reason for unsuccessful TCM follow-up call:  No answer/busy.  Attempted to reach patient at home number & cell number.

## 2021-05-15 NOTE — Telephone Encounter (Signed)
Transition Care Management Follow-up Telephone Call Date of discharge and from where: 05/13/2021-[facility] How have you been since you were released from the hospital? Doing ok but still very tired. Any questions or concerns? No  Items Reviewed: Did the pt receive and understand the discharge instructions provided? Yes  Medications obtained and verified? Yes  Other? Yes  Any new allergies since your discharge? No  Dietary orders reviewed? Yes Do you have support at home? No   Home Care and Equipment/Supplies: Were home health services ordered? yes If so, what is the name of the agency? Patient unsure of name  Has the agency set up a time to come to the patient's home? yes Were any new equipment or medical supplies ordered?  Yes: oxygen & Rollator walker What is the name of the medical supply agency? Pt unsure of name Were you able to get the supplies/equipment? yes Do you have any questions related to the use of the equipment or supplies? No  Functional Questionnaire: (I = Independent and D = Dependent) ADLs: I with assistance  Bathing/Dressing- I with assistance  Meal Prep- D  Eating- I  Maintaining continence- I  Transferring/Ambulation- I with Rollator  Managing Meds- D  Follow up appointments reviewed:  PCP Hospital f/u appt confirmed? Yes  Scheduled to see Dr. Charlesetta Shanks on 05/21/2019 @ 9:00. [facility] f/u appt confirmed? Yes  Scheduled to see Dr. Lanney Gins  on 05/27/21 @ 2:30. Are transportation arrangements needed? No  If their condition worsens, is the pt aware to call PCP or go to the Emergency Dept.? Yes Was the patient provided with contact information for the PCP's office or ED? Yes Was to pt encouraged to call back with questions or concerns? Yes

## 2021-05-16 NOTE — Telephone Encounter (Signed)
Pt's daughter Kim Santiago called and states that the 13th at 22 would work but the schedule would not allow me to put it in. Please advise.

## 2021-05-16 NOTE — Telephone Encounter (Signed)
Appt has been scheduled.

## 2021-05-20 ENCOUNTER — Telehealth: Payer: Self-pay | Admitting: Internal Medicine

## 2021-05-20 ENCOUNTER — Inpatient Hospital Stay: Payer: PPO | Admitting: Family Medicine

## 2021-05-20 ENCOUNTER — Other Ambulatory Visit: Payer: Self-pay

## 2021-05-20 ENCOUNTER — Ambulatory Visit (INDEPENDENT_AMBULATORY_CARE_PROVIDER_SITE_OTHER): Payer: PPO | Admitting: Internal Medicine

## 2021-05-20 VITALS — BP 124/72 | HR 105 | Temp 97.8°F | Resp 18 | Ht 63.0 in | Wt 135.2 lb

## 2021-05-20 DIAGNOSIS — R609 Edema, unspecified: Secondary | ICD-10-CM

## 2021-05-20 DIAGNOSIS — D638 Anemia in other chronic diseases classified elsewhere: Secondary | ICD-10-CM | POA: Diagnosis not present

## 2021-05-20 DIAGNOSIS — I872 Venous insufficiency (chronic) (peripheral): Secondary | ICD-10-CM | POA: Diagnosis not present

## 2021-05-20 DIAGNOSIS — E782 Mixed hyperlipidemia: Secondary | ICD-10-CM

## 2021-05-20 DIAGNOSIS — E8809 Other disorders of plasma-protein metabolism, not elsewhere classified: Secondary | ICD-10-CM

## 2021-05-20 DIAGNOSIS — Z09 Encounter for follow-up examination after completed treatment for conditions other than malignant neoplasm: Secondary | ICD-10-CM | POA: Diagnosis not present

## 2021-05-20 DIAGNOSIS — E032 Hypothyroidism due to medicaments and other exogenous substances: Secondary | ICD-10-CM

## 2021-05-20 DIAGNOSIS — J189 Pneumonia, unspecified organism: Secondary | ICD-10-CM | POA: Diagnosis not present

## 2021-05-20 DIAGNOSIS — F331 Major depressive disorder, recurrent, moderate: Secondary | ICD-10-CM | POA: Diagnosis not present

## 2021-05-20 DIAGNOSIS — I10 Essential (primary) hypertension: Secondary | ICD-10-CM

## 2021-05-20 LAB — CBC WITH DIFFERENTIAL/PLATELET
Basophils Absolute: 0 10*3/uL (ref 0.0–0.1)
Basophils Relative: 0.4 % (ref 0.0–3.0)
Eosinophils Absolute: 0 10*3/uL (ref 0.0–0.7)
Eosinophils Relative: 0.5 % (ref 0.0–5.0)
HCT: 32 % — ABNORMAL LOW (ref 36.0–46.0)
Hemoglobin: 11.1 g/dL — ABNORMAL LOW (ref 12.0–15.0)
Lymphocytes Relative: 10.5 % — ABNORMAL LOW (ref 12.0–46.0)
Lymphs Abs: 0.9 10*3/uL (ref 0.7–4.0)
MCHC: 34.8 g/dL (ref 30.0–36.0)
MCV: 100.7 fl — ABNORMAL HIGH (ref 78.0–100.0)
Monocytes Absolute: 1.2 10*3/uL — ABNORMAL HIGH (ref 0.1–1.0)
Monocytes Relative: 14.4 % — ABNORMAL HIGH (ref 3.0–12.0)
Neutro Abs: 6.4 10*3/uL (ref 1.4–7.7)
Neutrophils Relative %: 74.2 % (ref 43.0–77.0)
Platelets: 240 10*3/uL (ref 150.0–400.0)
RBC: 3.18 Mil/uL — ABNORMAL LOW (ref 3.87–5.11)
RDW: 20 % — ABNORMAL HIGH (ref 11.5–15.5)
WBC: 8.7 10*3/uL (ref 4.0–10.5)

## 2021-05-20 LAB — BASIC METABOLIC PANEL
BUN: 21 mg/dL (ref 6–23)
CO2: 31 mEq/L (ref 19–32)
Calcium: 8.7 mg/dL (ref 8.4–10.5)
Chloride: 91 mEq/L — ABNORMAL LOW (ref 96–112)
Creatinine, Ser: 0.82 mg/dL (ref 0.40–1.20)
GFR: 65.71 mL/min (ref >60.00)
Glucose, Bld: 91 mg/dL (ref 70–99)
Potassium: 4.2 mEq/L (ref 3.5–5.1)
Sodium: 131 mEq/L — ABNORMAL LOW (ref 135–145)

## 2021-05-20 MED ORDER — FLUCONAZOLE 150 MG PO TABS
150.0000 mg | ORAL_TABLET | Freq: Every day | ORAL | 0 refills | Status: DC
Start: 2021-05-20 — End: 2021-07-08

## 2021-05-20 MED ORDER — ALBUTEROL SULFATE HFA 108 (90 BASE) MCG/ACT IN AERS: 2.0000 | INHALATION_SPRAY | Freq: Four times a day (QID) | RESPIRATORY_TRACT | 11 refills | Status: DC | PRN

## 2021-05-20 NOTE — Assessment & Plan Note (Signed)
Secondary to recent COVID infection.  Recovering ventilation evidenced by good ambulatory sats .  Continue nocturnal oxygen

## 2021-05-20 NOTE — Assessment & Plan Note (Signed)
Patient is stable post discharge and has no new issues or questions about discharge plans at the visit today for hospital follow up. All labs , imaging studies and progress notes from admission were reviewed with patient today   

## 2021-05-20 NOTE — Assessment & Plan Note (Signed)
Using furosemide daily since discharge .  Aggravated by loss of protein

## 2021-05-20 NOTE — Progress Notes (Signed)
Subjective:  Patient ID: Kim Santiago, female    DOB: 09/01/1935  Age: 86 y.o. MRN: [id number]  CC: The primary encounter diagnosis was Edema, unspecified type. Diagnoses of Anemia, chronic disease, Venous insufficiency of both lower extremities, Major depressive disorder, recurrent episode, moderate ([place]), Anemia in other chronic diseases classified elsewhere, Essential hypertension, Iatrogenic hypothyroidism, Mixed hyperlipidemia, Edema due to hypoalbuminemia, Community acquired pneumonia of right lung, unspecified part of lung, and Hospital discharge follow-up were also pertinent to this visit.  HPI [name] presents for  Chief Complaint  Patient presents with   Hospitalization Follow-up   Accompanied by daughter [name] .  Kim Santiago is an 86 yr old female with discoid lupus, poorly controlled due to medication intolerances ,  recent COVID infection who was admitted to hospital on August 29 with hypoxic respiratory failure and generalized weakness. She was noted to have a RML and RLL pneumonia which was attributed to her recent COVID infection., an severely anemic, hgb 6.9 She received Remdesivir and steroids, was transfused one unit of blood  and was discharged on dexamethasone and supplemental oxygen on Sept 6.   Medication changes included suspension of losartan and methotrexate.   She states that since discharge she is"Not doing well. It's too much." She is referring to overwhelming fatigue,, complicated by proximal leg weakness, anorexia  Wearing oxygen 90% of the time. But readings are staying above 90% without oxygen.  No appetite.  Forcing herself to eat.  Sense of smell and taste slowly returning. No nausea.  Moving bowels without miralax. Stools have been liquid  but are becoming more formed. Takiing probiotics.    Only having one  stool daily.  Thrush on tongue from steroids  Today was the first day she went to the kitchen unassisted.  Sleeping more,  starts to feel "sick"  (weak all over "" after a few hours of being up.  PT and  OT were ordered,  neither have started,but PT evaluatipn was done and she was given a few exercises to do at home.  She is   trying to get up and down "very hard" Does not have a lift recliner, doesn't want one.   Outpatient Medications Prior to Visit  Medication Sig Dispense Refill   atorvastatin (LIPITOR) 20 MG tablet TAKE 1 TABLET BY MOUTH DAILY AT 6PM 90 tablet 1   Calcium Carbonate-Vitamin D 600-200 MG-UNIT TABS Take 2 tablets by mouth daily.     cholecalciferol (VITAMIN D) 1000 UNITS tablet Take 2,000 Units by mouth daily.     CRANBERRY PO Take 1 capsule by mouth 2 (two) times daily.     dexamethasone (DECADRON) 6 MG tablet Take 1 tablet (6 mg total) by mouth daily. 5 tablet 0   folic acid (FOLVITE) 1 MG tablet Take by mouth.     furosemide (LASIX) 20 MG tablet 1 TABLET EVERY OTHER DAY AS NEEDED 45 tablet 2   levothyroxine (SYNTHROID) 75 MCG tablet TAKE 1 TABLET BY MOUTH (75MCG TOTAL) DAILY 90 tablet 2   metoprolol succinate (TOPROL-XL) 25 MG 24 hr tablet TAKE 1 TABLET BY MOUTH EVERY DAY 90 tablet 3   nitroGLYCERIN (NITROSTAT) 0.4 MG SL tablet Place 1 tablet (0.4 mg total) under the tongue every 5 (five) minutes as needed for chest pain. Maximum 3 doses 50 tablet 3   omeprazole (PRILOSEC) 20 MG capsule Take 1 capsule (20 mg total) by mouth 2 (two) times daily before a meal. 180 capsule 1   polyethylene glycol (MIRALAX /  GLYCOLAX) packet Take 17 g by mouth daily.     Probiotic Product (PROBIOTIC PO) Take 1 tablet by mouth daily.     promethazine-dextromethorphan (PROMETHAZINE-DM) 6.25-15 MG/5ML syrup Take 5 mLs by mouth 4 (four) times daily as needed for cough. 180 mL 0   promethazine-phenylephrine 6.25-5 MG/5ML SYRP Take 5 mLs by mouth every 4 (four) hours as needed for congestion. 180 mL 0   sertraline (ZOLOFT) 25 MG tablet Take 25 mg by mouth daily.     sodium fluoride (PREVIDENT 5000 PLUS) 1.1 % CREA dental cream Take 1  application by mouth daily.      SODIUM FLUORIDE 5000 PPM 1.1 % PSTE Take by mouth.     tizanidine (ZANAFLEX) 2 MG capsule Take 1 capsule (2 mg total) by mouth 3 (three) times daily. 90 capsule 0   traZODone (DESYREL) 50 MG tablet Take 50 mg by mouth at bedtime.     triamcinolone cream (KENALOG) 0.1 % APPLY TO AFFECTED AREA TWICE A DAY     No facility-administered medications prior to visit.    Review of Systems;  Patient denies headache, fevers, malaise, unintentional weight loss, skin rash, eye pain, sinus congestion and sinus pain, sore throat, dysphagia,  hemoptysis , cough, dyspnea, wheezing, chest pain, palpitations, orthopnea, edema, abdominal pain, nausea, melena, diarrhea, constipation, flank pain, dysuria, hematuria, urinary  Frequency, nocturia, numbness, tingling, seizures,  Focal weakness, Loss of consciousness,  Tremor, insomnia, depression, anxiety, and suicidal ideation.      Objective:  BP 124/72   Pulse (!) 105   Temp 97.8 F (36.6 C)   Resp 18   Ht '5\' 3"'$  (1.6 m)   Wt 135 lb 3.2 oz (61.3 kg)   SpO2 95%   BMI 23.95 kg/m   BP Readings from Last 3 Encounters:  05/20/21 124/72  05/13/21 (!) 112/57  02/25/21 (!) 142/72    Wt Readings from Last 3 Encounters:  05/20/21 135 lb 3.2 oz (61.3 kg)  05/05/21 143 lb 4.8 oz (65 kg)  04/18/21 142 lb (64.4 kg)    General appearance: alert, cooperative and appears stated age Ears: normal TM's and external ear canals both ears Throat: lips, mucosa, and tongue normal; teeth and gums normal Neck: no adenopathy, no carotid bruit, supple, symmetrical, trachea midline and thyroid not enlarged, symmetric, no tenderness/mass/nodules Back: symmetric, no curvature. ROM normal. No CVA tenderness. Lungs: clear to auscultation bilaterally Heart: regular rate and rhythm, S1, S2 normal, no murmur, click, rub or gallop Abdomen: soft, non-tender; bowel sounds normal; no masses,  no organomegaly Pulses: 2+ and symmetric Skin: Skin  color, texture, turgor normal. No rashes or lesions Lymph nodes: Cervical, supraclavicular, and axillary nodes normal.  Lab Results  Component Value Date   HGBA1C 4.9 11/08/2018    Lab Results  Component Value Date   CREATININE 0.82 05/20/2021   CREATININE 0.75 05/11/2021   CREATININE 0.65 05/10/2021    Lab Results  Component Value Date   WBC 8.7 05/20/2021   HGB 11.1 (L) 05/20/2021   HCT 32.0 (L) 05/20/2021   PLT 240.0 05/20/2021   GLUCOSE 91 05/20/2021   CHOL 92 07/23/2020   TRIG 110.0 07/23/2020   HDL 31.40 (L) 07/23/2020   LDLDIRECT 117.6 12/27/2012   LDLCALC 39 07/23/2020   ALT 8 05/11/2021   AST 23 05/11/2021   NA 131 (L) 05/20/2021   K 4.2 05/20/2021   CL 91 (L) 05/20/2021   CREATININE 0.82 05/20/2021   BUN 21 05/20/2021   CO2 31 05/20/2021  TSH 1.630 05/09/2021   INR 1.0 10/10/2019   HGBA1C 4.9 11/08/2018    DG Chest Port 1 View  Result Date: 05/10/2021 CLINICAL DATA:  Abnormal chest x-ray. EXAM: PORTABLE CHEST 1 VIEW COMPARISON:  Radiograph 05/05/2021.  CT 05/08/2021 FINDINGS: Worsening lung aeration with progressive patchy opacities involving the right greater than left lung. Small pleural effusions have increased. Heart is normal in size, stable mediastinal contours. No pneumothorax. Again seen eventration of right hemidiaphragm. IMPRESSION: Worsening lung aeration with progressive patchy opacities involving the right greater than left lung. Findings may favor multifocal pneumonia over pulmonary edema. Small bilateral pleural effusions. Electronically Signed   By: Keith Rake M.D.   On: 05/10/2021 17:25   ECHOCARDIOGRAM COMPLETE  Result Date: 05/10/2021    ECHOCARDIOGRAM REPORT   Patient Name:   HARLEY ANDRESEN Date of Exam: 05/10/2021 Medical Rec #:  [id number]        Height:       63.0 in Accession #:    [id number]       Weight:       143.3 lb Date of Birth:  01/28/1935        BSA:          1.678 m Patient Age:    78 years         BP:           169/79 mmHg  Patient Gender: F                HR:           99 bpm. Exam Location:  [facility] Procedure: 2D Echo and Strain Analysis Indications:     CHF Acute Systolic [id number]  History:         Patient has prior history of Echocardiogram examinations, most                  recent 11/08/2018.  Sonographer:     Kathlen [place] RDCS Referring Phys:  [id number] Ottie Glazier Diagnosing Phys: Kate Sable MD  Sonographer Comments: Global longitudinal strain was attempted. IMPRESSIONS  1. Left ventricular ejection fraction, by estimation, is 55 to 60%. The left ventricle has normal function. The left ventricle has no regional wall motion abnormalities. Left ventricular diastolic parameters were normal.  2. Right ventricular systolic function is normal. The right ventricular size is normal.  3. The mitral valve is normal in structure. No evidence of mitral valve regurgitation.  4. The aortic valve is tricuspid. Aortic valve regurgitation is not visualized.  5. The inferior vena cava is normal in size with greater than 50% respiratory variability, suggesting right atrial pressure of 3 mmHg. FINDINGS  Left Ventricle: Left ventricular ejection fraction, by estimation, is 55 to 60%. The left ventricle has normal function. The left ventricle has no regional wall motion abnormalities. Global longitudinal strain performed but not reported based on interpreter judgement due to suboptimal tracking. The left ventricular internal cavity size was normal in size. There is no left ventricular hypertrophy. Left ventricular diastolic parameters were normal. Right Ventricle: The right ventricular size is normal. No increase in right ventricular wall thickness. Right ventricular systolic function is normal. Left Atrium: Left atrial size was normal in size. Right Atrium: Right atrial size was normal in size. Pericardium: There is no evidence of pericardial effusion. Mitral Valve: The mitral valve is normal in structure. No evidence of mitral valve  regurgitation. Tricuspid Valve: The tricuspid valve is normal in structure. Tricuspid valve regurgitation is mild. Aortic Valve:  The aortic valve is tricuspid. Aortic valve regurgitation is not visualized. Aortic valve peak gradient measures 6.8 mmHg. Pulmonic Valve: The pulmonic valve was normal in structure. Pulmonic valve regurgitation is trivial. Aorta: The aortic root and ascending aorta are structurally normal, with no evidence of dilitation. Venous: The inferior vena cava is normal in size with greater than 50% respiratory variability, suggesting right atrial pressure of 3 mmHg. IAS/Shunts: No atrial level shunt detected by color flow Doppler.  LEFT VENTRICLE PLAX 2D LVIDd:         3.50 cm  Diastology LVIDs:         2.50 cm  LV e' medial:    14.10 cm/s LV PW:         1.00 cm  LV E/e' medial:  7.6 LV IVS:        1.00 cm  LV e' lateral:   15.30 cm/s LVOT diam:     1.90 cm  LV E/e' lateral: 7.0 LV SV:         53 LV SV Index:   32 LVOT Area:     2.84 cm  RIGHT VENTRICLE RV Basal diam:  2.90 cm RV S prime:     15.10 cm/s TAPSE (M-mode): 2.1 cm LEFT ATRIUM             Index       RIGHT ATRIUM          Index LA diam:        3.00 cm 1.79 cm/m  RA Area:     9.68 cm LA Vol (A2C):   22.6 ml 13.47 ml/m RA Volume:   20.40 ml 12.16 ml/m LA Vol (A4C):   20.6 ml 12.28 ml/m LA Biplane Vol: 22.5 ml 13.41 ml/m  AORTIC VALVE                PULMONIC VALVE AV Area (Vmax): 2.11 cm    PV Vmax:       0.95 m/s AV Vmax:        130.00 cm/s PV Peak grad:  3.6 mmHg AV Peak Grad:   6.8 mmHg LVOT Vmax:      96.60 cm/s LVOT Vmean:     59.600 cm/s LVOT VTI:       0.187 m  AORTA Ao Root diam: 2.90 cm Ao Asc diam:  3.20 cm MITRAL VALVE                TRICUSPID VALVE MV Area (PHT): 5.16 cm     TV Peak grad:   27.1 mmHg MV Decel Time: 147 msec     TV Vmax:        2.61 m/s MV E velocity: 107.00 cm/s MV A velocity: 33.40 cm/s   SHUNTS MV E/A ratio:  3.20         Systemic VTI:  0.19 m                             Systemic Diam: 1.90 cm Kate Sable MD Electronically signed by Kate Sable MD Signature Date/Time: 05/10/2021/3:32:19 PM    Final     Assessment & Plan:   Problem List Items Addressed This Visit       Unprioritized   Essential hypertension    Losartan held during Sept 2022 admission, due to hypotension.  Continue to hold medication until sbp is > 150      Iatrogenic hypothyroidism    Thyroid function  has been  WNL on current dose of 75 mcg daily .  No current changes needed.   Lab Results  Component Value Date   TSH 1.630 05/09/2021         Hyperlipidemia    Managed with atorvastatin  Lab Results  Component Value Date   CHOL 92 07/23/2020   HDL 31.40 (L) 07/23/2020   LDLCALC 39 07/23/2020   LDLDIRECT 117.6 12/27/2012   TRIG 110.0 07/23/2020   CHOLHDL 3 07/23/2020    Lab Results  Component Value Date   ALT 8 05/11/2021   AST 23 05/11/2021   ALKPHOS 49 05/11/2021   BILITOT 0.7 05/11/2021         Major depressive disorder, recurrent episode, moderate ([facility])    aggravated by chronic illness and recent hospitalization . Continue sertraline for now at 25 mg daily and return in one month      Anemia    With severe drop noted during admission for COVID pneumonia with respiratory failure.. s/p transfusion x 1 unit for hgb 6.9,  Repeat needed today. MTX was stopped  During admission and held at discharge until she follows up with [facility]      Venous insufficiency of both lower extremities    Using furosemide daily since discharge .  Aggravated by loss of protein       Edema due to hypoalbuminemia    Continue leg elevation . Compression stockings,  And increased protein intake      Hospital discharge follow-up    Patient is stable post discharge and has no new issues or questions about discharge plans at the visit today for hospital follow up. All labs , imaging studies and progress notes from admission were reviewed with patient today        CAP  (community acquired pneumonia)    Secondary to recent COVID infection.  Recovering ventilation evidenced by good ambulatory sats .  Continue nocturnal oxygen      Relevant Medications   fluconazole (DIFLUCAN) 150 MG tablet   albuterol (VENTOLIN HFA) 108 (90 Base) MCG/ACT inhaler   Other Visit Diagnoses     Edema, unspecified type    -  Primary   Relevant Orders   Basic metabolic panel (Completed)   Anemia, chronic disease       Relevant Orders   CBC with Differential/Platelet (Completed)

## 2021-05-20 NOTE — Assessment & Plan Note (Signed)
With severe drop noted during admission for COVID pneumonia with respiratory failure.. s/p transfusion x 1 unit for hgb 6.9,  Repeat needed today. MTX was stopped  During admission and held at discharge until she follows up with [facility]

## 2021-05-20 NOTE — Telephone Encounter (Signed)
Received records from [facility] forwarded 5 pages to Dr. Deborra Medina 9/13/22fbg

## 2021-05-20 NOTE — Assessment & Plan Note (Signed)
Losartan held during Sept 2022 admission, due to hypotension.  Continue to hold medication until sbp is > 150

## 2021-05-20 NOTE — Assessment & Plan Note (Addendum)
aggravated by chronic illness and recent hospitalization . Continue sertraline for now at 25 mg daily and return in one month

## 2021-05-20 NOTE — Assessment & Plan Note (Signed)
Continue leg elevation . Compression stockings,  And increased protein intake

## 2021-05-20 NOTE — Patient Instructions (Signed)
Fluconazole and albuterol MDI sent to [facility]  If the albuterol is too $$$$,  let me know and I will order a nebulizer and liquid albuterol   I think you need a recliner that CAN LIFT YOU .  Check 02 sats while sleeping 1) in recliner 2) in bed

## 2021-05-20 NOTE — Assessment & Plan Note (Signed)
Thyroid function has been  WNL on current dose of 75 mcg daily .  No current changes needed.   Lab Results  Component Value Date   TSH 1.630 05/09/2021

## 2021-05-20 NOTE — Assessment & Plan Note (Signed)
Managed with atorvastatin  Lab Results  Component Value Date   CHOL 92 07/23/2020   HDL 31.40 (L) 07/23/2020   LDLCALC 39 07/23/2020   LDLDIRECT 117.6 12/27/2012   TRIG 110.0 07/23/2020   CHOLHDL 3 07/23/2020    Lab Results  Component Value Date   ALT 8 05/11/2021   AST 23 05/11/2021   ALKPHOS 49 05/11/2021   BILITOT 0.7 05/11/2021

## 2021-05-21 DIAGNOSIS — R627 Adult failure to thrive: Secondary | ICD-10-CM | POA: Diagnosis not present

## 2021-05-21 DIAGNOSIS — J9601 Acute respiratory failure with hypoxia: Secondary | ICD-10-CM | POA: Diagnosis not present

## 2021-05-21 DIAGNOSIS — I119 Hypertensive heart disease without heart failure: Secondary | ICD-10-CM | POA: Diagnosis not present

## 2021-05-21 DIAGNOSIS — M6281 Muscle weakness (generalized): Secondary | ICD-10-CM | POA: Diagnosis not present

## 2021-05-21 DIAGNOSIS — E890 Postprocedural hypothyroidism: Secondary | ICD-10-CM | POA: Diagnosis not present

## 2021-05-21 DIAGNOSIS — Z853 Personal history of malignant neoplasm of breast: Secondary | ICD-10-CM

## 2021-05-21 DIAGNOSIS — F419 Anxiety disorder, unspecified: Secondary | ICD-10-CM

## 2021-05-21 DIAGNOSIS — Z9981 Dependence on supplemental oxygen: Secondary | ICD-10-CM

## 2021-05-21 DIAGNOSIS — D539 Nutritional anemia, unspecified: Secondary | ICD-10-CM | POA: Diagnosis not present

## 2021-05-21 DIAGNOSIS — I2510 Atherosclerotic heart disease of native coronary artery without angina pectoris: Secondary | ICD-10-CM | POA: Diagnosis not present

## 2021-05-21 DIAGNOSIS — M3500 Sicca syndrome, unspecified: Secondary | ICD-10-CM

## 2021-05-21 DIAGNOSIS — Z9181 History of falling: Secondary | ICD-10-CM

## 2021-05-21 DIAGNOSIS — E871 Hypo-osmolality and hyponatremia: Secondary | ICD-10-CM | POA: Diagnosis not present

## 2021-05-21 DIAGNOSIS — M1990 Unspecified osteoarthritis, unspecified site: Secondary | ICD-10-CM

## 2021-05-21 DIAGNOSIS — M810 Age-related osteoporosis without current pathological fracture: Secondary | ICD-10-CM

## 2021-05-21 DIAGNOSIS — U071 COVID-19: Secondary | ICD-10-CM | POA: Diagnosis not present

## 2021-05-21 DIAGNOSIS — L719 Rosacea, unspecified: Secondary | ICD-10-CM

## 2021-05-21 DIAGNOSIS — K219 Gastro-esophageal reflux disease without esophagitis: Secondary | ICD-10-CM

## 2021-05-21 DIAGNOSIS — Z7952 Long term (current) use of systemic steroids: Secondary | ICD-10-CM

## 2021-05-21 DIAGNOSIS — I252 Old myocardial infarction: Secondary | ICD-10-CM | POA: Diagnosis not present

## 2021-05-21 DIAGNOSIS — E785 Hyperlipidemia, unspecified: Secondary | ICD-10-CM | POA: Diagnosis not present

## 2021-05-21 DIAGNOSIS — Z9012 Acquired absence of left breast and nipple: Secondary | ICD-10-CM

## 2021-05-21 DIAGNOSIS — Z79899 Other long term (current) drug therapy: Secondary | ICD-10-CM

## 2021-05-21 DIAGNOSIS — Z87440 Personal history of urinary (tract) infections: Secondary | ICD-10-CM

## 2021-05-21 DIAGNOSIS — L931 Subacute cutaneous lupus erythematosus: Secondary | ICD-10-CM

## 2021-05-21 DIAGNOSIS — J1282 Pneumonia due to coronavirus disease 2019: Secondary | ICD-10-CM | POA: Diagnosis not present

## 2021-05-21 LAB — MISC LABCORP TEST (SEND OUT): Labcorp test code: 164284

## 2021-05-23 DIAGNOSIS — R627 Adult failure to thrive: Secondary | ICD-10-CM | POA: Diagnosis not present

## 2021-05-23 DIAGNOSIS — K219 Gastro-esophageal reflux disease without esophagitis: Secondary | ICD-10-CM | POA: Diagnosis not present

## 2021-05-23 DIAGNOSIS — Z7952 Long term (current) use of systemic steroids: Secondary | ICD-10-CM | POA: Diagnosis not present

## 2021-05-23 DIAGNOSIS — I252 Old myocardial infarction: Secondary | ICD-10-CM | POA: Diagnosis not present

## 2021-05-23 DIAGNOSIS — J1282 Pneumonia due to coronavirus disease 2019: Secondary | ICD-10-CM | POA: Diagnosis not present

## 2021-05-23 DIAGNOSIS — M810 Age-related osteoporosis without current pathological fracture: Secondary | ICD-10-CM | POA: Diagnosis not present

## 2021-05-23 DIAGNOSIS — I119 Hypertensive heart disease without heart failure: Secondary | ICD-10-CM | POA: Diagnosis not present

## 2021-05-23 DIAGNOSIS — L719 Rosacea, unspecified: Secondary | ICD-10-CM | POA: Diagnosis not present

## 2021-05-23 DIAGNOSIS — L931 Subacute cutaneous lupus erythematosus: Secondary | ICD-10-CM | POA: Diagnosis not present

## 2021-05-23 DIAGNOSIS — M6281 Muscle weakness (generalized): Secondary | ICD-10-CM | POA: Diagnosis not present

## 2021-05-23 DIAGNOSIS — F419 Anxiety disorder, unspecified: Secondary | ICD-10-CM | POA: Diagnosis not present

## 2021-05-23 DIAGNOSIS — E785 Hyperlipidemia, unspecified: Secondary | ICD-10-CM | POA: Diagnosis not present

## 2021-05-23 DIAGNOSIS — Z9981 Dependence on supplemental oxygen: Secondary | ICD-10-CM | POA: Diagnosis not present

## 2021-05-23 DIAGNOSIS — Z9181 History of falling: Secondary | ICD-10-CM | POA: Diagnosis not present

## 2021-05-23 DIAGNOSIS — Z87440 Personal history of urinary (tract) infections: Secondary | ICD-10-CM | POA: Diagnosis not present

## 2021-05-23 DIAGNOSIS — D539 Nutritional anemia, unspecified: Secondary | ICD-10-CM | POA: Diagnosis not present

## 2021-05-23 DIAGNOSIS — J9601 Acute respiratory failure with hypoxia: Secondary | ICD-10-CM | POA: Diagnosis not present

## 2021-05-23 DIAGNOSIS — M3500 Sicca syndrome, unspecified: Secondary | ICD-10-CM | POA: Diagnosis not present

## 2021-05-23 DIAGNOSIS — M1990 Unspecified osteoarthritis, unspecified site: Secondary | ICD-10-CM | POA: Diagnosis not present

## 2021-05-23 DIAGNOSIS — E890 Postprocedural hypothyroidism: Secondary | ICD-10-CM | POA: Diagnosis not present

## 2021-05-23 DIAGNOSIS — E871 Hypo-osmolality and hyponatremia: Secondary | ICD-10-CM | POA: Diagnosis not present

## 2021-05-23 DIAGNOSIS — U071 COVID-19: Secondary | ICD-10-CM | POA: Diagnosis not present

## 2021-05-23 DIAGNOSIS — Z79899 Other long term (current) drug therapy: Secondary | ICD-10-CM | POA: Diagnosis not present

## 2021-05-23 DIAGNOSIS — Z853 Personal history of malignant neoplasm of breast: Secondary | ICD-10-CM | POA: Diagnosis not present

## 2021-05-23 DIAGNOSIS — I2510 Atherosclerotic heart disease of native coronary artery without angina pectoris: Secondary | ICD-10-CM | POA: Diagnosis not present

## 2021-05-27 ENCOUNTER — Inpatient Hospital Stay: Payer: PPO | Admitting: Infectious Diseases

## 2021-05-27 DIAGNOSIS — J811 Chronic pulmonary edema: Secondary | ICD-10-CM | POA: Diagnosis not present

## 2021-05-28 ENCOUNTER — Telehealth: Payer: Self-pay | Admitting: Internal Medicine

## 2021-05-28 NOTE — Telephone Encounter (Signed)
[facility] calling in about [facility]ational Therapy orders on 05/22/21. They state they have not gotten these orders back yet. States this is urgent.   Please advise   Call back [phone]

## 2021-05-28 NOTE — Telephone Encounter (Signed)
LMTCB

## 2021-05-29 DIAGNOSIS — Z79899 Other long term (current) drug therapy: Secondary | ICD-10-CM | POA: Diagnosis not present

## 2021-05-29 DIAGNOSIS — L931 Subacute cutaneous lupus erythematosus: Secondary | ICD-10-CM | POA: Diagnosis not present

## 2021-05-29 DIAGNOSIS — L932 Other local lupus erythematosus: Secondary | ICD-10-CM | POA: Diagnosis not present

## 2021-05-30 DIAGNOSIS — F419 Anxiety disorder, unspecified: Secondary | ICD-10-CM | POA: Diagnosis not present

## 2021-05-30 DIAGNOSIS — Z9981 Dependence on supplemental oxygen: Secondary | ICD-10-CM | POA: Diagnosis not present

## 2021-05-30 DIAGNOSIS — J9601 Acute respiratory failure with hypoxia: Secondary | ICD-10-CM | POA: Diagnosis not present

## 2021-05-30 DIAGNOSIS — I252 Old myocardial infarction: Secondary | ICD-10-CM | POA: Diagnosis not present

## 2021-05-30 DIAGNOSIS — E871 Hypo-osmolality and hyponatremia: Secondary | ICD-10-CM | POA: Diagnosis not present

## 2021-05-30 DIAGNOSIS — I119 Hypertensive heart disease without heart failure: Secondary | ICD-10-CM | POA: Diagnosis not present

## 2021-05-30 DIAGNOSIS — I2510 Atherosclerotic heart disease of native coronary artery without angina pectoris: Secondary | ICD-10-CM | POA: Diagnosis not present

## 2021-05-30 DIAGNOSIS — L719 Rosacea, unspecified: Secondary | ICD-10-CM | POA: Diagnosis not present

## 2021-05-30 DIAGNOSIS — M6281 Muscle weakness (generalized): Secondary | ICD-10-CM | POA: Diagnosis not present

## 2021-05-30 DIAGNOSIS — Z853 Personal history of malignant neoplasm of breast: Secondary | ICD-10-CM | POA: Diagnosis not present

## 2021-05-30 DIAGNOSIS — M1990 Unspecified osteoarthritis, unspecified site: Secondary | ICD-10-CM | POA: Diagnosis not present

## 2021-05-30 DIAGNOSIS — E785 Hyperlipidemia, unspecified: Secondary | ICD-10-CM | POA: Diagnosis not present

## 2021-05-30 DIAGNOSIS — U071 COVID-19: Secondary | ICD-10-CM | POA: Diagnosis not present

## 2021-05-30 DIAGNOSIS — Z87440 Personal history of urinary (tract) infections: Secondary | ICD-10-CM | POA: Diagnosis not present

## 2021-05-30 DIAGNOSIS — Z79899 Other long term (current) drug therapy: Secondary | ICD-10-CM | POA: Diagnosis not present

## 2021-05-30 DIAGNOSIS — J1282 Pneumonia due to coronavirus disease 2019: Secondary | ICD-10-CM | POA: Diagnosis not present

## 2021-05-30 DIAGNOSIS — R627 Adult failure to thrive: Secondary | ICD-10-CM | POA: Diagnosis not present

## 2021-05-30 DIAGNOSIS — Z7952 Long term (current) use of systemic steroids: Secondary | ICD-10-CM | POA: Diagnosis not present

## 2021-05-30 DIAGNOSIS — K219 Gastro-esophageal reflux disease without esophagitis: Secondary | ICD-10-CM | POA: Diagnosis not present

## 2021-05-30 DIAGNOSIS — D539 Nutritional anemia, unspecified: Secondary | ICD-10-CM | POA: Diagnosis not present

## 2021-05-30 DIAGNOSIS — E890 Postprocedural hypothyroidism: Secondary | ICD-10-CM | POA: Diagnosis not present

## 2021-05-30 DIAGNOSIS — L931 Subacute cutaneous lupus erythematosus: Secondary | ICD-10-CM | POA: Diagnosis not present

## 2021-05-30 DIAGNOSIS — M3500 Sicca syndrome, unspecified: Secondary | ICD-10-CM | POA: Diagnosis not present

## 2021-05-30 DIAGNOSIS — Z9181 History of falling: Secondary | ICD-10-CM | POA: Diagnosis not present

## 2021-05-30 DIAGNOSIS — M810 Age-related osteoporosis without current pathological fracture: Secondary | ICD-10-CM | POA: Diagnosis not present

## 2021-06-02 NOTE — Telephone Encounter (Signed)
Spoke with Kim Santiago and she stated that she received these orders on Friday last week.

## 2021-06-03 DIAGNOSIS — I2510 Atherosclerotic heart disease of native coronary artery without angina pectoris: Secondary | ICD-10-CM | POA: Diagnosis not present

## 2021-06-03 DIAGNOSIS — U071 COVID-19: Secondary | ICD-10-CM | POA: Diagnosis not present

## 2021-06-03 DIAGNOSIS — J1282 Pneumonia due to coronavirus disease 2019: Secondary | ICD-10-CM | POA: Diagnosis not present

## 2021-06-03 DIAGNOSIS — E871 Hypo-osmolality and hyponatremia: Secondary | ICD-10-CM | POA: Diagnosis not present

## 2021-06-03 DIAGNOSIS — E785 Hyperlipidemia, unspecified: Secondary | ICD-10-CM | POA: Diagnosis not present

## 2021-06-03 DIAGNOSIS — Z9981 Dependence on supplemental oxygen: Secondary | ICD-10-CM | POA: Diagnosis not present

## 2021-06-03 DIAGNOSIS — F419 Anxiety disorder, unspecified: Secondary | ICD-10-CM | POA: Diagnosis not present

## 2021-06-03 DIAGNOSIS — M810 Age-related osteoporosis without current pathological fracture: Secondary | ICD-10-CM | POA: Diagnosis not present

## 2021-06-03 DIAGNOSIS — Z87440 Personal history of urinary (tract) infections: Secondary | ICD-10-CM | POA: Diagnosis not present

## 2021-06-03 DIAGNOSIS — D539 Nutritional anemia, unspecified: Secondary | ICD-10-CM | POA: Diagnosis not present

## 2021-06-03 DIAGNOSIS — L719 Rosacea, unspecified: Secondary | ICD-10-CM | POA: Diagnosis not present

## 2021-06-03 DIAGNOSIS — Z853 Personal history of malignant neoplasm of breast: Secondary | ICD-10-CM | POA: Diagnosis not present

## 2021-06-03 DIAGNOSIS — Z7952 Long term (current) use of systemic steroids: Secondary | ICD-10-CM | POA: Diagnosis not present

## 2021-06-03 DIAGNOSIS — Z79899 Other long term (current) drug therapy: Secondary | ICD-10-CM | POA: Diagnosis not present

## 2021-06-03 DIAGNOSIS — R627 Adult failure to thrive: Secondary | ICD-10-CM | POA: Diagnosis not present

## 2021-06-03 DIAGNOSIS — K219 Gastro-esophageal reflux disease without esophagitis: Secondary | ICD-10-CM | POA: Diagnosis not present

## 2021-06-03 DIAGNOSIS — L931 Subacute cutaneous lupus erythematosus: Secondary | ICD-10-CM | POA: Diagnosis not present

## 2021-06-03 DIAGNOSIS — M6281 Muscle weakness (generalized): Secondary | ICD-10-CM | POA: Diagnosis not present

## 2021-06-03 DIAGNOSIS — E890 Postprocedural hypothyroidism: Secondary | ICD-10-CM | POA: Diagnosis not present

## 2021-06-03 DIAGNOSIS — I252 Old myocardial infarction: Secondary | ICD-10-CM | POA: Diagnosis not present

## 2021-06-03 DIAGNOSIS — M3500 Sicca syndrome, unspecified: Secondary | ICD-10-CM | POA: Diagnosis not present

## 2021-06-03 DIAGNOSIS — I119 Hypertensive heart disease without heart failure: Secondary | ICD-10-CM | POA: Diagnosis not present

## 2021-06-03 DIAGNOSIS — M1990 Unspecified osteoarthritis, unspecified site: Secondary | ICD-10-CM | POA: Diagnosis not present

## 2021-06-03 DIAGNOSIS — Z9181 History of falling: Secondary | ICD-10-CM | POA: Diagnosis not present

## 2021-06-03 DIAGNOSIS — J9601 Acute respiratory failure with hypoxia: Secondary | ICD-10-CM | POA: Diagnosis not present

## 2021-06-05 DIAGNOSIS — I119 Hypertensive heart disease without heart failure: Secondary | ICD-10-CM | POA: Diagnosis not present

## 2021-06-05 DIAGNOSIS — M3500 Sicca syndrome, unspecified: Secondary | ICD-10-CM | POA: Diagnosis not present

## 2021-06-05 DIAGNOSIS — M1990 Unspecified osteoarthritis, unspecified site: Secondary | ICD-10-CM | POA: Diagnosis not present

## 2021-06-05 DIAGNOSIS — Z87440 Personal history of urinary (tract) infections: Secondary | ICD-10-CM | POA: Diagnosis not present

## 2021-06-05 DIAGNOSIS — Z79899 Other long term (current) drug therapy: Secondary | ICD-10-CM | POA: Diagnosis not present

## 2021-06-05 DIAGNOSIS — I2510 Atherosclerotic heart disease of native coronary artery without angina pectoris: Secondary | ICD-10-CM | POA: Diagnosis not present

## 2021-06-05 DIAGNOSIS — E890 Postprocedural hypothyroidism: Secondary | ICD-10-CM | POA: Diagnosis not present

## 2021-06-05 DIAGNOSIS — J9601 Acute respiratory failure with hypoxia: Secondary | ICD-10-CM | POA: Diagnosis not present

## 2021-06-05 DIAGNOSIS — U071 COVID-19: Secondary | ICD-10-CM | POA: Diagnosis not present

## 2021-06-05 DIAGNOSIS — J1282 Pneumonia due to coronavirus disease 2019: Secondary | ICD-10-CM | POA: Diagnosis not present

## 2021-06-05 DIAGNOSIS — L931 Subacute cutaneous lupus erythematosus: Secondary | ICD-10-CM | POA: Diagnosis not present

## 2021-06-05 DIAGNOSIS — M810 Age-related osteoporosis without current pathological fracture: Secondary | ICD-10-CM | POA: Diagnosis not present

## 2021-06-05 DIAGNOSIS — Z853 Personal history of malignant neoplasm of breast: Secondary | ICD-10-CM | POA: Diagnosis not present

## 2021-06-05 DIAGNOSIS — F419 Anxiety disorder, unspecified: Secondary | ICD-10-CM | POA: Diagnosis not present

## 2021-06-05 DIAGNOSIS — Z7952 Long term (current) use of systemic steroids: Secondary | ICD-10-CM | POA: Diagnosis not present

## 2021-06-05 DIAGNOSIS — L719 Rosacea, unspecified: Secondary | ICD-10-CM | POA: Diagnosis not present

## 2021-06-05 DIAGNOSIS — R627 Adult failure to thrive: Secondary | ICD-10-CM | POA: Diagnosis not present

## 2021-06-05 DIAGNOSIS — Z9181 History of falling: Secondary | ICD-10-CM | POA: Diagnosis not present

## 2021-06-05 DIAGNOSIS — I252 Old myocardial infarction: Secondary | ICD-10-CM | POA: Diagnosis not present

## 2021-06-05 DIAGNOSIS — D539 Nutritional anemia, unspecified: Secondary | ICD-10-CM | POA: Diagnosis not present

## 2021-06-05 DIAGNOSIS — M6281 Muscle weakness (generalized): Secondary | ICD-10-CM | POA: Diagnosis not present

## 2021-06-05 DIAGNOSIS — K219 Gastro-esophageal reflux disease without esophagitis: Secondary | ICD-10-CM | POA: Diagnosis not present

## 2021-06-05 DIAGNOSIS — E871 Hypo-osmolality and hyponatremia: Secondary | ICD-10-CM | POA: Diagnosis not present

## 2021-06-05 DIAGNOSIS — E785 Hyperlipidemia, unspecified: Secondary | ICD-10-CM | POA: Diagnosis not present

## 2021-06-05 DIAGNOSIS — Z9981 Dependence on supplemental oxygen: Secondary | ICD-10-CM | POA: Diagnosis not present

## 2021-06-11 DIAGNOSIS — U071 COVID-19: Secondary | ICD-10-CM | POA: Diagnosis not present

## 2021-06-11 DIAGNOSIS — E890 Postprocedural hypothyroidism: Secondary | ICD-10-CM | POA: Diagnosis not present

## 2021-06-11 DIAGNOSIS — M1990 Unspecified osteoarthritis, unspecified site: Secondary | ICD-10-CM | POA: Diagnosis not present

## 2021-06-11 DIAGNOSIS — F419 Anxiety disorder, unspecified: Secondary | ICD-10-CM | POA: Diagnosis not present

## 2021-06-11 DIAGNOSIS — E785 Hyperlipidemia, unspecified: Secondary | ICD-10-CM | POA: Diagnosis not present

## 2021-06-11 DIAGNOSIS — Z9981 Dependence on supplemental oxygen: Secondary | ICD-10-CM | POA: Diagnosis not present

## 2021-06-11 DIAGNOSIS — Z87440 Personal history of urinary (tract) infections: Secondary | ICD-10-CM | POA: Diagnosis not present

## 2021-06-11 DIAGNOSIS — M810 Age-related osteoporosis without current pathological fracture: Secondary | ICD-10-CM | POA: Diagnosis not present

## 2021-06-11 DIAGNOSIS — I252 Old myocardial infarction: Secondary | ICD-10-CM | POA: Diagnosis not present

## 2021-06-11 DIAGNOSIS — I2510 Atherosclerotic heart disease of native coronary artery without angina pectoris: Secondary | ICD-10-CM | POA: Diagnosis not present

## 2021-06-11 DIAGNOSIS — Z7952 Long term (current) use of systemic steroids: Secondary | ICD-10-CM | POA: Diagnosis not present

## 2021-06-11 DIAGNOSIS — M6281 Muscle weakness (generalized): Secondary | ICD-10-CM | POA: Diagnosis not present

## 2021-06-11 DIAGNOSIS — L931 Subacute cutaneous lupus erythematosus: Secondary | ICD-10-CM | POA: Diagnosis not present

## 2021-06-11 DIAGNOSIS — K219 Gastro-esophageal reflux disease without esophagitis: Secondary | ICD-10-CM | POA: Diagnosis not present

## 2021-06-11 DIAGNOSIS — M3500 Sicca syndrome, unspecified: Secondary | ICD-10-CM | POA: Diagnosis not present

## 2021-06-11 DIAGNOSIS — R627 Adult failure to thrive: Secondary | ICD-10-CM | POA: Diagnosis not present

## 2021-06-11 DIAGNOSIS — Z79899 Other long term (current) drug therapy: Secondary | ICD-10-CM | POA: Diagnosis not present

## 2021-06-11 DIAGNOSIS — J1282 Pneumonia due to coronavirus disease 2019: Secondary | ICD-10-CM | POA: Diagnosis not present

## 2021-06-11 DIAGNOSIS — L719 Rosacea, unspecified: Secondary | ICD-10-CM | POA: Diagnosis not present

## 2021-06-11 DIAGNOSIS — Z9181 History of falling: Secondary | ICD-10-CM | POA: Diagnosis not present

## 2021-06-11 DIAGNOSIS — Z853 Personal history of malignant neoplasm of breast: Secondary | ICD-10-CM | POA: Diagnosis not present

## 2021-06-11 DIAGNOSIS — E871 Hypo-osmolality and hyponatremia: Secondary | ICD-10-CM | POA: Diagnosis not present

## 2021-06-11 DIAGNOSIS — I119 Hypertensive heart disease without heart failure: Secondary | ICD-10-CM | POA: Diagnosis not present

## 2021-06-11 DIAGNOSIS — J9601 Acute respiratory failure with hypoxia: Secondary | ICD-10-CM | POA: Diagnosis not present

## 2021-06-11 DIAGNOSIS — D539 Nutritional anemia, unspecified: Secondary | ICD-10-CM | POA: Diagnosis not present

## 2021-06-13 DIAGNOSIS — L719 Rosacea, unspecified: Secondary | ICD-10-CM | POA: Diagnosis not present

## 2021-06-13 DIAGNOSIS — E871 Hypo-osmolality and hyponatremia: Secondary | ICD-10-CM | POA: Diagnosis not present

## 2021-06-13 DIAGNOSIS — I119 Hypertensive heart disease without heart failure: Secondary | ICD-10-CM | POA: Diagnosis not present

## 2021-06-13 DIAGNOSIS — Z79899 Other long term (current) drug therapy: Secondary | ICD-10-CM | POA: Diagnosis not present

## 2021-06-13 DIAGNOSIS — U071 COVID-19: Secondary | ICD-10-CM | POA: Diagnosis not present

## 2021-06-13 DIAGNOSIS — L931 Subacute cutaneous lupus erythematosus: Secondary | ICD-10-CM | POA: Diagnosis not present

## 2021-06-13 DIAGNOSIS — K219 Gastro-esophageal reflux disease without esophagitis: Secondary | ICD-10-CM | POA: Diagnosis not present

## 2021-06-13 DIAGNOSIS — J9601 Acute respiratory failure with hypoxia: Secondary | ICD-10-CM | POA: Diagnosis not present

## 2021-06-13 DIAGNOSIS — E785 Hyperlipidemia, unspecified: Secondary | ICD-10-CM | POA: Diagnosis not present

## 2021-06-13 DIAGNOSIS — R627 Adult failure to thrive: Secondary | ICD-10-CM | POA: Diagnosis not present

## 2021-06-13 DIAGNOSIS — Z9981 Dependence on supplemental oxygen: Secondary | ICD-10-CM | POA: Diagnosis not present

## 2021-06-13 DIAGNOSIS — A419 Sepsis, unspecified organism: Secondary | ICD-10-CM | POA: Diagnosis not present

## 2021-06-13 DIAGNOSIS — M3500 Sicca syndrome, unspecified: Secondary | ICD-10-CM | POA: Diagnosis not present

## 2021-06-13 DIAGNOSIS — I5020 Unspecified systolic (congestive) heart failure: Secondary | ICD-10-CM | POA: Diagnosis not present

## 2021-06-13 DIAGNOSIS — J189 Pneumonia, unspecified organism: Secondary | ICD-10-CM | POA: Diagnosis not present

## 2021-06-13 DIAGNOSIS — M810 Age-related osteoporosis without current pathological fracture: Secondary | ICD-10-CM | POA: Diagnosis not present

## 2021-06-13 DIAGNOSIS — Z87440 Personal history of urinary (tract) infections: Secondary | ICD-10-CM | POA: Diagnosis not present

## 2021-06-13 DIAGNOSIS — D539 Nutritional anemia, unspecified: Secondary | ICD-10-CM | POA: Diagnosis not present

## 2021-06-13 DIAGNOSIS — I2510 Atherosclerotic heart disease of native coronary artery without angina pectoris: Secondary | ICD-10-CM | POA: Diagnosis not present

## 2021-06-13 DIAGNOSIS — J1282 Pneumonia due to coronavirus disease 2019: Secondary | ICD-10-CM | POA: Diagnosis not present

## 2021-06-13 DIAGNOSIS — Z9181 History of falling: Secondary | ICD-10-CM | POA: Diagnosis not present

## 2021-06-13 DIAGNOSIS — Z7952 Long term (current) use of systemic steroids: Secondary | ICD-10-CM | POA: Diagnosis not present

## 2021-06-13 DIAGNOSIS — Z853 Personal history of malignant neoplasm of breast: Secondary | ICD-10-CM | POA: Diagnosis not present

## 2021-06-13 DIAGNOSIS — F419 Anxiety disorder, unspecified: Secondary | ICD-10-CM | POA: Diagnosis not present

## 2021-06-13 DIAGNOSIS — M1990 Unspecified osteoarthritis, unspecified site: Secondary | ICD-10-CM | POA: Diagnosis not present

## 2021-06-13 DIAGNOSIS — E890 Postprocedural hypothyroidism: Secondary | ICD-10-CM | POA: Diagnosis not present

## 2021-06-13 DIAGNOSIS — I252 Old myocardial infarction: Secondary | ICD-10-CM | POA: Diagnosis not present

## 2021-06-13 DIAGNOSIS — N390 Urinary tract infection, site not specified: Secondary | ICD-10-CM | POA: Diagnosis not present

## 2021-06-13 DIAGNOSIS — M6281 Muscle weakness (generalized): Secondary | ICD-10-CM | POA: Diagnosis not present

## 2021-06-18 ENCOUNTER — Ambulatory Visit (INDEPENDENT_AMBULATORY_CARE_PROVIDER_SITE_OTHER): Payer: PPO | Admitting: Internal Medicine

## 2021-06-18 ENCOUNTER — Encounter: Payer: Self-pay | Admitting: Internal Medicine

## 2021-06-18 ENCOUNTER — Other Ambulatory Visit: Payer: Self-pay

## 2021-06-18 VITALS — BP 128/66 | HR 85 | Temp 95.3°F | Ht 62.99 in | Wt 135.2 lb

## 2021-06-18 DIAGNOSIS — I5042 Chronic combined systolic (congestive) and diastolic (congestive) heart failure: Secondary | ICD-10-CM

## 2021-06-18 DIAGNOSIS — R531 Weakness: Secondary | ICD-10-CM

## 2021-06-18 DIAGNOSIS — E8809 Other disorders of plasma-protein metabolism, not elsewhere classified: Secondary | ICD-10-CM

## 2021-06-18 DIAGNOSIS — Z23 Encounter for immunization: Secondary | ICD-10-CM

## 2021-06-18 DIAGNOSIS — M3500 Sicca syndrome, unspecified: Secondary | ICD-10-CM | POA: Diagnosis not present

## 2021-06-18 LAB — BASIC METABOLIC PANEL
BUN: 14 mg/dL (ref 6–23)
CO2: 30 mEq/L (ref 19–32)
Calcium: 8.8 mg/dL (ref 8.4–10.5)
Chloride: 98 mEq/L (ref 96–112)
Creatinine, Ser: 0.73 mg/dL (ref 0.40–1.20)
GFR: 75.5 mL/min (ref >60.00)
Glucose, Bld: 88 mg/dL (ref 70–99)
Potassium: 4.4 mEq/L (ref 3.5–5.1)
Sodium: 133 mEq/L — ABNORMAL LOW (ref 135–145)

## 2021-06-18 NOTE — Progress Notes (Signed)
Subjective:  Patient ID: Kim Santiago, female    DOB: 01-07-1935  Age: 86 y.o. MRN: [id number]  CC: The primary encounter diagnosis was Chronic combined systolic and diastolic heart failure (Kim Santiago). Diagnoses of Need for immunization against influenza, Sjogren's syndrome, with unspecified organ involvement (Kim Santiago), Edema due to hypoalbuminemia, and Generalized weakness were also pertinent to this visit.  HPI Kim Santiago presents for follow up on chronic issues  Chief Complaint  Patient presents with   Follow-up    This visit occurred during the SARS-CoV-2 public health emergency.  Safety protocols were in place, including screening questions prior to the visit, additional usage of staff PPE, and extensive cleaning of exam room while observing appropriate contact time as indicated for disinfecting solutions.   Kim Santiago is an 86 yr old female with hard to control discoid lupus managed  by [facility], Sjogren's Syndrome, unilateral hemiplegia secondary to CVA,  and history of thyroid and breast cancer who was recently hospitalized for hypoxic respiratory failure secondary to community acquired pneumonia following a COVID 19 infection. .  She has had a slow recovery due to fatigue and generalized weakness but has been  recuperating at home with home health  PT and weaning from supplemental oxygen .     Energy level improved.  Now walking without a walker.  Getting PT   Using oxygen at night for nocturnal hypoxia.  Daytime readings 90% . Spent one night without it and did not notice any change.  Wants to d/c the oxygen from the house. Her daughter has checked her pulse ox while asleep in her bed and the reading was 95%    Hypoxic respiratory failure resolved.  She has had   Pulmonary follow up with Dr Kim Santiago since discharge . On Sept 20 per his notes,  aldactone added  to supplement her daily dose of 20 mg furosemide She is confused about her medications and has been taking furosemide  every other day . Thanks she is taking aldcatone daily, but not sure why it was prescribed. She feels the combination is aggravating her dry mouth.    Outpatient Medications Prior to Visit  Medication Sig Dispense Refill   albuterol (VENTOLIN HFA) 108 (90 Base) MCG/ACT inhaler Inhale 2 puffs into the lungs every 6 (six) hours as needed for wheezing or shortness of breath. 3.7 g 11   atorvastatin (LIPITOR) 20 MG tablet TAKE 1 TABLET BY MOUTH DAILY AT 6PM 90 tablet 1   Calcium Carbonate-Vitamin D 600-200 MG-UNIT TABS Take 2 tablets by mouth daily.     cholecalciferol (VITAMIN D) 1000 UNITS tablet Take 2,000 Units by mouth daily.     CRANBERRY PO Take 1 capsule by mouth 2 (two) times daily.     dexamethasone (DECADRON) 6 MG tablet Take 1 tablet (6 mg total) by mouth daily. 5 tablet 0   fluconazole (DIFLUCAN) 150 MG tablet Take 1 tablet (150 mg total) by mouth daily. 2 tablet 0   folic acid (FOLVITE) 1 MG tablet Take by mouth.     furosemide (LASIX) 20 MG tablet 1 TABLET EVERY OTHER DAY AS NEEDED 45 tablet 2   levothyroxine (SYNTHROID) 75 MCG tablet TAKE 1 TABLET BY MOUTH (75MCG TOTAL) DAILY 90 tablet 2   metoprolol succinate (TOPROL-XL) 25 MG 24 hr tablet TAKE 1 TABLET BY MOUTH EVERY DAY 90 tablet 3   nitroGLYCERIN (NITROSTAT) 0.4 MG SL tablet Place 1 tablet (0.4 mg total) under the tongue every 5 (five) minutes as needed  for chest pain. Maximum 3 doses 50 tablet 3   omeprazole (PRILOSEC) 20 MG capsule Take 1 capsule (20 mg total) by mouth 2 (two) times daily before a meal. 180 capsule 1   polyethylene glycol (MIRALAX / GLYCOLAX) packet Take 17 g by mouth daily.     Probiotic Product (PROBIOTIC PO) Take 1 tablet by mouth daily.     sertraline (ZOLOFT) 25 MG tablet Take 25 mg by mouth daily.     sodium fluoride (PREVIDENT 5000 PLUS) 1.1 % CREA dental cream Take 1 application by mouth daily.      SODIUM FLUORIDE 5000 PPM 1.1 % PSTE Take by mouth.     tizanidine (ZANAFLEX) 2 MG capsule Take 1 capsule  (2 mg total) by mouth 3 (three) times daily. 90 capsule 0   traZODone (DESYREL) 50 MG tablet Take 50 mg by mouth at bedtime.     triamcinolone cream (KENALOG) 0.1 % APPLY TO AFFECTED AREA TWICE A DAY     promethazine-dextromethorphan (PROMETHAZINE-DM) 6.25-15 MG/5ML syrup Take 5 mLs by mouth 4 (four) times daily as needed for cough. (Patient not taking: Reported on 06/18/2021) 180 mL 0   promethazine-phenylephrine 6.25-5 MG/5ML SYRP Take 5 mLs by mouth every 4 (four) hours as needed for congestion. (Patient not taking: Reported on 06/18/2021) 180 mL 0   No facility-administered medications prior to visit.    Review of Systems;  Patient denies headache, fevers, malaise, unintentional weight loss, skin rash, eye pain, sinus congestion and sinus pain, sore throat, dysphagia,  hemoptysis , cough, dyspnea, wheezing, chest pain, palpitations, orthopnea, edema, abdominal pain, nausea, melena, diarrhea, constipation, flank pain, dysuria, hematuria, urinary  Frequency, nocturia, numbness, tingling, seizures,  Focal weakness, Loss of consciousness,  Tremor, insomnia, depression, anxiety, and suicidal ideation.      Objective:  BP 128/66   Pulse 85   Temp (!) 95.3 F (35.2 C)   Ht 5' 2.99" (1.6 m)   Wt 135 lb 3.2 oz (61.3 kg)   SpO2 96%   BMI 23.96 kg/m   BP Readings from Last 3 Encounters:  06/18/21 128/66  05/20/21 124/72  05/13/21 (!) 112/57    Wt Readings from Last 3 Encounters:  06/18/21 135 lb 3.2 oz (61.3 kg)  05/20/21 135 lb 3.2 oz (61.3 kg)  05/05/21 143 lb 4.8 oz (65 kg)    General appearance: alert, cooperative and appears stated age Ears: normal TM's and external ear canals both ears Throat: lips, mucosa, and tongue normal; teeth and gums normal Neck: no adenopathy, no carotid bruit, supple, symmetrical, trachea midline and thyroid not enlarged, symmetric, no tenderness/mass/nodules Back: symmetric, no curvature. ROM normal. No CVA tenderness. Lungs: clear to auscultation  bilaterally Heart: regular rate and rhythm, S1, S2 normal, no murmur, click, rub or gallop Abdomen: soft, non-tender; bowel sounds normal; no masses,  no organomegaly Pulses: 2+ and symmetric Skin: Skin color, texture, turgor normal. No rashes or lesions Lymph nodes: Cervical, supraclavicular, and axillary nodes normal.  Lab Results  Component Value Date   HGBA1C 4.9 11/08/2018    Lab Results  Component Value Date   CREATININE 0.73 06/18/2021   CREATININE 0.82 05/20/2021   CREATININE 0.75 05/11/2021    Lab Results  Component Value Date   WBC 8.7 05/20/2021   HGB 11.1 (L) 05/20/2021   HCT 32.0 (L) 05/20/2021   PLT 240.0 05/20/2021   GLUCOSE 88 06/18/2021   CHOL 92 07/23/2020   TRIG 110.0 07/23/2020   HDL 31.40 (L) 07/23/2020   LDLDIRECT  117.6 12/27/2012   LDLCALC 39 07/23/2020   ALT 8 05/11/2021   AST 23 05/11/2021   NA 133 (L) 06/18/2021   K 4.4 06/18/2021   CL 98 06/18/2021   CREATININE 0.73 06/18/2021   BUN 14 06/18/2021   CO2 30 06/18/2021   TSH 1.630 05/09/2021   INR 1.0 10/10/2019   HGBA1C 4.9 11/08/2018    DG Chest Port 1 View  Result Date: 05/10/2021 CLINICAL DATA:  Abnormal chest x-ray. EXAM: PORTABLE CHEST 1 VIEW COMPARISON:  Radiograph 05/05/2021.  CT 05/08/2021 FINDINGS: Worsening lung aeration with progressive patchy opacities involving the right greater than left lung. Small pleural effusions have increased. Heart is normal in size, stable mediastinal contours. No pneumothorax. Again seen eventration of right hemidiaphragm. IMPRESSION: Worsening lung aeration with progressive patchy opacities involving the right greater than left lung. Findings may favor multifocal pneumonia over pulmonary edema. Small bilateral pleural effusions. Electronically Signed   By: Keith Rake M.D.   On: 05/10/2021 17:25   ECHOCARDIOGRAM COMPLETE  Result Date: 05/10/2021    ECHOCARDIOGRAM REPORT   Patient Name:   Kim Santiago Date of Exam: 05/10/2021 Medical Rec #:   [id number]        Height:       63.0 in Accession #:    [id number]       Weight:       143.3 lb Date of Birth:  1935/02/21        BSA:          1.678 m Patient Age:    77 years         BP:           169/79 mmHg Patient Gender: F                HR:           99 bpm. Exam Location:  [facility] Procedure: 2D Echo and Strain Analysis Indications:     CHF Acute Systolic W29.56  History:         Patient has prior history of Echocardiogram examinations, most                  recent 11/08/2018.  Sonographer:     Kathlen Brunswick RDCS Referring Phys:  [id number] Ottie Glazier Diagnosing Phys: Kate Sable MD  Sonographer Comments: Global longitudinal strain was attempted. IMPRESSIONS  1. Left ventricular ejection fraction, by estimation, is 55 to 60%. The left ventricle has normal function. The left ventricle has no regional wall motion abnormalities. Left ventricular diastolic parameters were normal.  2. Right ventricular systolic function is normal. The right ventricular size is normal.  3. The mitral valve is normal in structure. No evidence of mitral valve regurgitation.  4. The aortic valve is tricuspid. Aortic valve regurgitation is not visualized.  5. The inferior vena cava is normal in size with greater than 50% respiratory variability, suggesting right atrial pressure of 3 mmHg. FINDINGS  Left Ventricle: Left ventricular ejection fraction, by estimation, is 55 to 60%. The left ventricle has normal function. The left ventricle has no regional wall motion abnormalities. Global longitudinal strain performed but not reported based on interpreter judgement due to suboptimal tracking. The left ventricular internal cavity size was normal in size. There is no left ventricular hypertrophy. Left ventricular diastolic parameters were normal. Right Ventricle: The right ventricular size is normal. No increase in right ventricular wall thickness. Right ventricular systolic function is normal. Left Atrium: Left atrial size was normal in  size. Right Atrium: Right atrial size was normal in size. Pericardium: There is no evidence of pericardial effusion. Mitral Valve: The mitral valve is normal in structure. No evidence of mitral valve regurgitation. Tricuspid Valve: The tricuspid valve is normal in structure. Tricuspid valve regurgitation is mild. Aortic Valve: The aortic valve is tricuspid. Aortic valve regurgitation is not visualized. Aortic valve peak gradient measures 6.8 mmHg. Pulmonic Valve: The pulmonic valve was normal in structure. Pulmonic valve regurgitation is trivial. Aorta: The aortic root and ascending aorta are structurally normal, with no evidence of dilitation. Venous: The inferior vena cava is normal in size with greater than 50% respiratory variability, suggesting right atrial pressure of 3 mmHg. IAS/Shunts: No atrial level shunt detected by color flow Doppler.  LEFT VENTRICLE PLAX 2D LVIDd:         3.50 cm  Diastology LVIDs:         2.50 cm  LV e' medial:    14.10 cm/s LV PW:         1.00 cm  LV E/e' medial:  7.6 LV IVS:        1.00 cm  LV e' lateral:   15.30 cm/s LVOT diam:     1.90 cm  LV E/e' lateral: 7.0 LV SV:         53 LV SV Index:   32 LVOT Area:     2.84 cm  RIGHT VENTRICLE RV Basal diam:  2.90 cm RV S prime:     15.10 cm/s TAPSE (M-mode): 2.1 cm LEFT ATRIUM             Index       RIGHT ATRIUM          Index LA diam:        3.00 cm 1.79 cm/m  RA Area:     9.68 cm LA Vol (A2C):   22.6 ml 13.47 ml/m RA Volume:   20.40 ml 12.16 ml/m LA Vol (A4C):   20.6 ml 12.28 ml/m LA Biplane Vol: 22.5 ml 13.41 ml/m  AORTIC VALVE                PULMONIC VALVE AV Area (Vmax): 2.11 cm    PV Vmax:       0.95 m/s AV Vmax:        130.00 cm/s PV Peak grad:  3.6 mmHg AV Peak Grad:   6.8 mmHg LVOT Vmax:      96.60 cm/s LVOT Vmean:     59.600 cm/s LVOT VTI:       0.187 m  AORTA Ao Root diam: 2.90 cm Ao Asc diam:  3.20 cm MITRAL VALVE                TRICUSPID VALVE MV Area (PHT): 5.16 cm     TV Peak grad:   27.1 mmHg MV Decel Time: 147  msec     TV Vmax:        2.61 m/s MV E velocity: 107.00 cm/s MV A velocity: 33.40 cm/s   SHUNTS MV E/A ratio:  3.20         Systemic VTI:  0.19 m                             Systemic Diam: 1.90 cm Kate Sable MD Electronically signed by Kate Sable MD Signature Date/Time: 05/10/2021/3:32:19 PM    Final     Assessment & Plan:   Problem List  Items Addressed This Visit       Unprioritized   Edema due to hypoalbuminemia    Continue leg elevation . Compression stockings,  And increased protein intake      Chronic combined systolic and diastolic heart failure ([place]) - Primary    Previously low EF of 35% now resolved.  Pulmonary has reduced diuresis with low dose  aldactone added to preserve potassium balance.       Relevant Orders   Basic metabolic panel (Completed)   AMB Referral to [place]   Sjogren's syndrome [facility])    Previously managed by Dr Jefm Bryant,   With dry eye, dry mouth,  Managed with Restasis and prevident dental cream.  Did not tolerate Placquenil due to concurrent development of a diffuse rash which turned out be discoid lupus,  Managing symptoms , currently on prednisone        Relevant Orders   AMB Referral to [place]   Generalized weakness    Post COVID infection complicated by pneumonia and respiratory failure.  improving with PT and improved nutritional status       Relevant Orders   AMB Referral to [place]   Other Visit Diagnoses     Need for immunization against influenza       Relevant Orders   Flu Vaccine QUAD High Dose(Fluad) (Completed)       I provided  22 minutes    reviewing patient's current problems and recent encounters with his specialists, reviewing and ordering  labs and imaging studies, providing counseling on the above mentioned problems , and evaluating patient  In a face to face visit  .   There are no discontinued medications.  Follow-up: Return in about 3 months (around  09/18/2021).   [name], MD

## 2021-06-18 NOTE — Patient Instructions (Addendum)
You can stop using the oxygen at night. I will need to write a letter to the company supplying it   If you start feeling more tired or start waking up with a headache,  let me know and we will order a home sleep study to determine if you still need supplemental oxygen   I'm glad the physical therapy is helping !   Continue the aldactone and the furosemide.we are checkinn your potassium level today

## 2021-06-19 ENCOUNTER — Encounter: Payer: Self-pay | Admitting: Internal Medicine

## 2021-06-19 DIAGNOSIS — I5032 Chronic diastolic (congestive) heart failure: Secondary | ICD-10-CM | POA: Insufficient documentation

## 2021-06-19 DIAGNOSIS — I5042 Chronic combined systolic (congestive) and diastolic (congestive) heart failure: Secondary | ICD-10-CM | POA: Insufficient documentation

## 2021-06-19 NOTE — Assessment & Plan Note (Signed)
Post COVID infection complicated by pneumonia and respiratory failure.  improving with PT and improved nutritional status

## 2021-06-19 NOTE — Assessment & Plan Note (Addendum)
Previously low EF of 35% now resolved.  Pulmonary has reduced diuresis with low dose  aldactone added to preserve potassium balance.

## 2021-06-19 NOTE — Assessment & Plan Note (Addendum)
Previously managed by Dr Jefm Bryant,   With dry eye, dry mouth,  Managed with Restasis and prevident dental cream.  Did not tolerate Placquenil due to concurrent development of a diffuse rash which turned out be discoid lupus,  Managing symptoms , currently on prednisone

## 2021-06-19 NOTE — Assessment & Plan Note (Signed)
Continue leg elevation . Compression stockings,  And increased protein intake

## 2021-06-20 ENCOUNTER — Telehealth: Payer: Self-pay

## 2021-06-20 NOTE — Chronic Care Management (AMB) (Signed)
  Chronic Care Management   Note  06/20/2021 Name: Kim Santiago MRN: [id number] DOB: October 09, 1934  Kim Santiago is a 86 y.o. year old female who is a primary care patient of Tullo, Aris Everts, MD. I reached out to [name] by phone today in response to a referral sent by Kim Santiago's PCP.  Kim Santiago was given information about Chronic Care Management services today including:  CCM service includes personalized support from designated clinical staff supervised by her physician, including individualized plan of care and coordination with other care providers 24/7 contact phone numbers for assistance for urgent and routine care needs. Service will only be billed when office clinical staff spend 20 minutes or more in a month to coordinate care. Only one practitioner may furnish and bill the service in a calendar month. The patient may stop CCM services at any time (effective at the end of the month) by phone call to the office staff. The patient is responsible for co-pay (up to 20% after annual deductible is met) if co-pay is required by the individual health plan.   Patient agreed to services and verbal consent obtained.   Follow up plan: Telephone appointment with care management team member scheduled for: RN CM 06/30/2021 Pharm D 07/08/2021  Kim Santiago, [name], [name], [name] [address] Direct Dial: [phone] [email or web address] Website: [email or web address]

## 2021-06-23 NOTE — Telephone Encounter (Signed)
Please send the "D/c oxygen" letter to [employer]

## 2021-06-27 DIAGNOSIS — M1990 Unspecified osteoarthritis, unspecified site: Secondary | ICD-10-CM | POA: Diagnosis not present

## 2021-06-27 DIAGNOSIS — I119 Hypertensive heart disease without heart failure: Secondary | ICD-10-CM | POA: Diagnosis not present

## 2021-06-27 DIAGNOSIS — F419 Anxiety disorder, unspecified: Secondary | ICD-10-CM | POA: Diagnosis not present

## 2021-06-27 DIAGNOSIS — Z79899 Other long term (current) drug therapy: Secondary | ICD-10-CM | POA: Diagnosis not present

## 2021-06-27 DIAGNOSIS — Z853 Personal history of malignant neoplasm of breast: Secondary | ICD-10-CM | POA: Diagnosis not present

## 2021-06-27 DIAGNOSIS — E785 Hyperlipidemia, unspecified: Secondary | ICD-10-CM | POA: Diagnosis not present

## 2021-06-27 DIAGNOSIS — I2510 Atherosclerotic heart disease of native coronary artery without angina pectoris: Secondary | ICD-10-CM | POA: Diagnosis not present

## 2021-06-27 DIAGNOSIS — Z7952 Long term (current) use of systemic steroids: Secondary | ICD-10-CM | POA: Diagnosis not present

## 2021-06-27 DIAGNOSIS — L719 Rosacea, unspecified: Secondary | ICD-10-CM | POA: Diagnosis not present

## 2021-06-27 DIAGNOSIS — J9601 Acute respiratory failure with hypoxia: Secondary | ICD-10-CM | POA: Diagnosis not present

## 2021-06-27 DIAGNOSIS — U071 COVID-19: Secondary | ICD-10-CM | POA: Diagnosis not present

## 2021-06-27 DIAGNOSIS — Z9981 Dependence on supplemental oxygen: Secondary | ICD-10-CM | POA: Diagnosis not present

## 2021-06-27 DIAGNOSIS — L931 Subacute cutaneous lupus erythematosus: Secondary | ICD-10-CM | POA: Diagnosis not present

## 2021-06-27 DIAGNOSIS — R627 Adult failure to thrive: Secondary | ICD-10-CM | POA: Diagnosis not present

## 2021-06-27 DIAGNOSIS — I252 Old myocardial infarction: Secondary | ICD-10-CM | POA: Diagnosis not present

## 2021-06-27 DIAGNOSIS — K219 Gastro-esophageal reflux disease without esophagitis: Secondary | ICD-10-CM | POA: Diagnosis not present

## 2021-06-27 DIAGNOSIS — M3500 Sicca syndrome, unspecified: Secondary | ICD-10-CM | POA: Diagnosis not present

## 2021-06-27 DIAGNOSIS — D539 Nutritional anemia, unspecified: Secondary | ICD-10-CM | POA: Diagnosis not present

## 2021-06-27 DIAGNOSIS — M810 Age-related osteoporosis without current pathological fracture: Secondary | ICD-10-CM | POA: Diagnosis not present

## 2021-06-27 DIAGNOSIS — E890 Postprocedural hypothyroidism: Secondary | ICD-10-CM | POA: Diagnosis not present

## 2021-06-27 DIAGNOSIS — E871 Hypo-osmolality and hyponatremia: Secondary | ICD-10-CM | POA: Diagnosis not present

## 2021-06-27 DIAGNOSIS — J1282 Pneumonia due to coronavirus disease 2019: Secondary | ICD-10-CM | POA: Diagnosis not present

## 2021-06-27 DIAGNOSIS — M6281 Muscle weakness (generalized): Secondary | ICD-10-CM | POA: Diagnosis not present

## 2021-06-27 DIAGNOSIS — Z9181 History of falling: Secondary | ICD-10-CM | POA: Diagnosis not present

## 2021-06-27 DIAGNOSIS — Z87440 Personal history of urinary (tract) infections: Secondary | ICD-10-CM | POA: Diagnosis not present

## 2021-06-30 ENCOUNTER — Ambulatory Visit (INDEPENDENT_AMBULATORY_CARE_PROVIDER_SITE_OTHER): Payer: PPO | Admitting: *Deleted

## 2021-06-30 DIAGNOSIS — I10 Essential (primary) hypertension: Secondary | ICD-10-CM

## 2021-06-30 DIAGNOSIS — I5042 Chronic combined systolic (congestive) and diastolic (congestive) heart failure: Secondary | ICD-10-CM

## 2021-07-01 DIAGNOSIS — H903 Sensorineural hearing loss, bilateral: Secondary | ICD-10-CM | POA: Diagnosis not present

## 2021-07-01 DIAGNOSIS — H6121 Impacted cerumen, right ear: Secondary | ICD-10-CM | POA: Diagnosis not present

## 2021-07-01 NOTE — Patient Instructions (Signed)
Visit Information   PATIENT GOALS:   Goals Addressed             This Visit's Progress    (RNCM)Track and Manage Fluids and Swelling-Heart Failure       Timeframe:  Long-Range Goal Priority:  Medium Start Date: 06/30/21                             Expected End Date: 12/05/21                      Follow Up Date 07/28/21    Call office if I gain more than 2 pounds in one day or 5 pounds in one week Keep legs up while sitting Use salt in moderation Watch for swelling in feet, ankles and legs every day Weigh myself daily writing in log Check blood pressures at least once a week keeping log for provider review   Why is this important?   It is important to check your weight daily and watch how much salt and liquids you have.  It will help you to manage your heart failure.    Notes:         Consent to CCM Services: Ms. Gardenhire was given information about Chronic Care Management services including:  CCM service includes personalized support from designated clinical staff supervised by her physician, including individualized plan of care and coordination with other care providers 24/7 contact phone numbers for assistance for urgent and routine care needs. Service will only be billed when office clinical staff spend 20 minutes or more in a month to coordinate care. Only one practitioner may furnish and bill the service in a calendar month. The patient may stop CCM services at any time (effective at the end of the month) by phone call to the office staff. The patient will be responsible for cost sharing (co-pay) of up to 20% of the service fee (after annual deductible is met).  Patient agreed to services and verbal consent obtained.   Patient verbalizes understanding of instructions provided today and agrees to view in [place].   The care management team will reach out to the patient again over the next 30 days.   Hubert Azure RN, MSN RN Care Management Coordinator [place] [phone] [email or web address]   CLINICAL CARE PLAN: Patient Care Plan: CCM RNCM Care Plane     Problem Identified: Knowledge deficiet related to management of CHF, HTN   Priority: Medium     Long-Range Goal: Patient will work with CM team to gain knowledge of self health care knowledge and management   Start Date: 06/30/2021  Expected End Date: 12/05/2021  Priority: Medium  Note:   Current Barriers:  Knowledge Deficits related to plan of care for management of CHF and HTN   RNCM Clinical Goal(s):  Patient will verbalize understanding of plan for management of CHF and HTN  verbalize basic understanding of CHF and HTN disease process and self health management plan as previous take all medications exactly as prescribed and will call provider for medication related questions through collaboration with RN Care manager, provider, and care team.   Interventions: 1:1 collaboration with primary care provider regarding development and update of comprehensive plan of care as evidenced by provider attestation and co-signature Inter-disciplinary care team collaboration (see longitudinal plan of care) Evaluation of current treatment plan related to  self management and patient's adherence to plan as established by provider  Heart Failure Interventions:  (Status: New goal.) Basic overview and discussion of pathophysiology of Heart Failure reviewed; Provided education on low sodium diet; Reviewed Heart Failure Action Plan in depth and provided written copy; Assessed need for readable accurate scales in home; Provided education about placing scale on hard, flat surface; Advised patient to weigh each morning after emptying bladder; Discussed importance of daily weight and advised patient to weigh and record daily; Reviewed role of diuretics in prevention of fluid overload and management of heart failure; Discussed the importance of keeping all  appointments with provider;  Hypertension: (Status: New goal.) Last practice recorded BP readings:  BP Readings from Last 3 Encounters:  06/18/21 128/66  05/20/21 124/72  05/13/21 (!) 112/57  Most recent eGFR/CrCl: No results found for: EGFR  No components found for: CRCL  Evaluation of current treatment plan related to hypertension self management and patient's adherence to plan as established by provider;   Provided education to patient re: stroke prevention, s/s of heart attack and stroke; Reviewed prescribed diet low salt Reviewed medications with patient and discussed importance of compliance;  Counseled on adverse effects of illicit drug and excessive alcohol use in patients with high blood pressure;  Counseled on the importance of exercise goals with target of 150 minutes per week Discussed plans with patient for ongoing care management follow up and provided patient with direct contact information for care management team; Screening for signs and symptoms of depression related to chronic disease state;  Assessed social determinant of health barriers;   Patient Goals/Self-Care Activities: Patient will self administer medications as prescribed as evidenced by self report/primary caregiver report  Patient will attend all scheduled provider appointments as evidenced by clinician review of documented attendance to scheduled appointments and patient/caregiver report Call office if I gain more than 2 pounds in one day or 5 pounds in one week Keep legs up while sitting Use salt in moderation Watch for swelling in feet, ankles and legs every day Weigh myself daily writing in log Check blood pressures at least once a week keeping log for provider review

## 2021-07-01 NOTE — Chronic Care Management (AMB) (Signed)
Chronic Care Management   CCM RN Visit Note  07/01/2021 Name: Kim Santiago MRN: [id number] DOB: 12-08-1934  Subjective: Kim Santiago is a 86 y.o. year old female who is a primary care patient of [name], MD. The care management team was consulted for assistance with disease management and care coordination needs.    Engaged with patient by telephone for follow up visit in response to provider referral for case management and/or care coordination services.   Consent to Services:  The patient was given information about Chronic Care Management services, agreed to services, and gave verbal consent prior to initiation of services.  Please see initial visit note for detailed documentation.   Patient agreed to services and verbal consent obtained.   Assessment: Review of patient past medical history, allergies, medications, health status, including review of consultants reports, laboratory and other test data, was performed as part of comprehensive evaluation and provision of chronic care management services.   SDOH (Social Determinants of Health) assessments and interventions performed:  SDOH Interventions    Flowsheet Row Most Recent Value  SDOH Interventions   Food Insecurity Interventions Intervention Not Indicated  Financial Strain Interventions Intervention Not Indicated  Housing Interventions Intervention Not Indicated  Intimate Partner Violence Interventions Intervention Not Indicated  Transportation Interventions Intervention Not Indicated        CCM Care Plan  Allergies  Allergen Reactions   Azathioprine Nausea And Vomiting    Severe vomiting   Hydroxychloroquine Hives and Nausea And Vomiting   Mycophenolate Mofetil Nausea Only   Amoxicillin Rash    Has patient had a PCN reaction causing immediate rash, facial/tongue/throat swelling, SOB or lightheadedness with hypotension: Unknown Has patient had a PCN reaction causing severe rash involving mucus  membranes or skin necrosis: Unknown Has patient had a PCN reaction that required hospitalization: Unknown Has patient had a PCN reaction occurring within the last 10 years: Unknown If all of the above answers are "NO", then may proceed with Cephalosporin use. Tolerates ceftriaxone and Ancef   Codeine Nausea And Vomiting   Naprosyn [Naproxen] Swelling   Orudis [Ketoprofen] Hives   Sulfa Antibiotics Rash   Sulfathiazole Rash    Outpatient Encounter Medications as of 06/30/2021  Medication Sig Note   albuterol (VENTOLIN HFA) 108 (90 Base) MCG/ACT inhaler Inhale 2 puffs into the lungs every 6 (six) hours as needed for wheezing or shortness of breath.    atorvastatin (LIPITOR) 20 MG tablet TAKE 1 TABLET BY MOUTH DAILY AT 6PM    Calcium Carbonate-Vitamin D 600-200 MG-UNIT TABS Take 2 tablets by mouth daily.    CRANBERRY PO Take 1 capsule by mouth 2 (two) times daily.    diphenhydramine-acetaminophen (TYLENOL PM) 25-500 MG TABS tablet Take 1 tablet by mouth at bedtime as needed.    folic acid (FOLVITE) 1 MG tablet Take by mouth.    furosemide (LASIX) 20 MG tablet 1 TABLET EVERY OTHER DAY AS NEEDED    levothyroxine (SYNTHROID) 75 MCG tablet TAKE 1 TABLET BY MOUTH (75MCG TOTAL) DAILY    metoprolol succinate (TOPROL-XL) 25 MG 24 hr tablet TAKE 1 TABLET BY MOUTH EVERY DAY    omeprazole (PRILOSEC) 20 MG capsule Take 1 capsule (20 mg total) by mouth 2 (two) times daily before a meal. 06/30/2021: Takes as needed per patient   polyethylene glycol (MIRALAX / GLYCOLAX) packet Take 17 g by mouth daily.    Probiotic Product (PROBIOTIC PO) Take 1 tablet by mouth daily.    sertraline (ZOLOFT) 25  MG tablet Take 25 mg by mouth daily.    spironolactone (ALDACTONE) 25 MG tablet Take 1 tablet by mouth daily.    cholecalciferol (VITAMIN D) 1000 UNITS tablet Take 2,000 Units by mouth daily. (Patient not taking: Reported on 06/30/2021)    dexamethasone (DECADRON) 6 MG tablet Take 1 tablet (6 mg total) by mouth  daily. (Patient not taking: Reported on 06/30/2021)    fluconazole (DIFLUCAN) 150 MG tablet Take 1 tablet (150 mg total) by mouth daily. (Patient not taking: Reported on 06/30/2021)    nitroGLYCERIN (NITROSTAT) 0.4 MG SL tablet Place 1 tablet (0.4 mg total) under the tongue every 5 (five) minutes as needed for chest pain. Maximum 3 doses    promethazine-dextromethorphan (PROMETHAZINE-DM) 6.25-15 MG/5ML syrup Take 5 mLs by mouth 4 (four) times daily as needed for cough. (Patient not taking: Reported on 06/18/2021)    promethazine-phenylephrine 6.25-5 MG/5ML SYRP Take 5 mLs by mouth every 4 (four) hours as needed for congestion. (Patient not taking: Reported on 06/18/2021)    sodium fluoride (PREVIDENT 5000 PLUS) 1.1 % CREA dental cream Take 1 application by mouth daily.     SODIUM FLUORIDE 5000 PPM 1.1 % PSTE Take by mouth.    tizanidine (ZANAFLEX) 2 MG capsule Take 1 capsule (2 mg total) by mouth 3 (three) times daily. 04/27/2018: PRN   traZODone (DESYREL) 50 MG tablet Take 50 mg by mouth at bedtime. (Patient not taking: Reported on 06/30/2021)    triamcinolone cream (KENALOG) 0.1 % APPLY TO AFFECTED AREA TWICE A DAY    No facility-administered encounter medications on file as of 06/30/2021.    Patient Active Problem List   Diagnosis Date Noted   Chronic combined systolic and diastolic heart failure ([place]) 06/19/2021   COVID-19 virus infection 04/20/2021   Generalized weakness 11/23/2020   Vaccine reaction, initial encounter 08/20/2020   Secondary and unspecified malignant neoplasm of axilla and upper limb lymph nodes ([place]) 01/16/2020   Other drug-induced neutropenia ([place]) 01/16/2020   Hemiplegia affecting dominant side, post-stroke ([place]) 01/16/2020   Does use hearing aid 07/14/2019   Hypothyroidism due to acquired atrophy of thyroid 04/08/2019   Impaired balance as late effect of cerebrovascular accident 11/13/2018   History of CVA with residual deficit 11/13/2018   Leukopenia 11/11/2018    Anxiety about health 07/05/2018   Mediastinal adenopathy 04/27/2018   Edema due to hypoalbuminemia 02/20/2018   Hospital discharge follow-up 02/20/2018   History of vertebral fracture 02/18/2018   GERD (gastroesophageal reflux disease) 10/31/2017   Hand joint pain 06/13/2017   Vaginal atrophy 05/11/2017   Venous insufficiency of both lower extremities 01/10/2017   Discoid lupus 06/26/2016   Anemia 05/24/2016   Recurrent UTI 04/29/2016   TMJ syndrome 03/31/2016   Hearing loss 03/12/2016   Osteopenia 12/05/2015   Malignant neoplasm of left female breast ([place]) 11/26/2015   Hx of thyroid cancer 06/12/2014   Adenomatous polyp 06/12/2014   Major depressive disorder, recurrent episode, moderate ([place]) 05/29/2014   Grief 04/08/2014   Sjogren's syndrome ([place]) 03/09/2014   Insomnia due to stress 03/06/2014   Hyperlipidemia 12/28/2012   Medicare annual wellness visit, subsequent 12/28/2012   Essential hypertension 10/02/2011   Iatrogenic hypothyroidism 10/02/2011   Purpura ([place]) 05/25/2011    Conditions to be addressed/monitored:CHF and HTN  Care Plan : CCM RNCM Care [place]  Updates made by Leona Singleton, RN since 07/01/2021 12:00 AM     Problem: Knowledge deficiet related to management of CHF, HTN   Priority: Medium  Long-Range Goal: Patient will work with CM team to gain knowledge of self health care knowledge and management   Start Date: 06/30/2021  Expected End Date: 12/05/2021  Priority: Medium  Note:   Current Barriers:  Knowledge Deficits related to plan of care for management of CHF and HTN   RNCM Clinical Goal(s):  Patient will verbalize understanding of plan for management of CHF and HTN  verbalize basic understanding of CHF and HTN disease process and self health management plan as previous take all medications exactly as prescribed and will call provider for medication related questions through collaboration with RN Care manager, provider, and care team.    Interventions: 1:1 collaboration with primary care provider regarding development and update of comprehensive plan of care as evidenced by provider attestation and co-signature Inter-disciplinary care team collaboration (see longitudinal plan of care) Evaluation of current treatment plan related to  self management and patient's adherence to plan as established by provider   Heart Failure Interventions:  (Status: New goal.) Basic overview and discussion of pathophysiology of Heart Failure reviewed; Provided education on low sodium diet; Reviewed Heart Failure Action Plan in depth and provided written copy; Assessed need for readable accurate scales in home; Provided education about placing scale on hard, flat surface; Advised patient to weigh each morning after emptying bladder; Discussed importance of daily weight and advised patient to weigh and record daily; Reviewed role of diuretics in prevention of fluid overload and management of heart failure; Discussed the importance of keeping all appointments with provider;  Hypertension: (Status: New goal.) Last practice recorded BP readings:  BP Readings from Last 3 Encounters:  06/18/21 128/66  05/20/21 124/72  05/13/21 (!) 112/57  Most recent eGFR/CrCl: No results found for: EGFR  No components found for: CRCL  Evaluation of current treatment plan related to hypertension self management and patient's adherence to plan as established by provider;   Provided education to patient re: stroke prevention, s/s of heart attack and stroke; Reviewed prescribed diet low salt Reviewed medications with patient and discussed importance of compliance;  Counseled on adverse effects of illicit drug and excessive alcohol use in patients with high blood pressure;  Counseled on the importance of exercise goals with target of 150 minutes per week Discussed plans with patient for ongoing care management follow up and provided patient with direct contact  information for care management team; Screening for signs and symptoms of depression related to chronic disease state;  Assessed social determinant of health barriers;   Patient Goals/Self-Care Activities: Patient will self administer medications as prescribed as evidenced by self report/primary caregiver report  Patient will attend all scheduled provider appointments as evidenced by clinician review of documented attendance to scheduled appointments and patient/caregiver report Call office if I gain more than 2 pounds in one day or 5 pounds in one week Keep legs up while sitting Use salt in moderation Watch for swelling in feet, ankles and legs every day Weigh myself daily writing in log Check blood pressures at least once a week keeping log for provider review         Plan:The care management team will reach out to the patient again over the next 30 business days.  Hubert Azure RN, MSN RN Care Management Coordinator [place] [phone] [email or web address]

## 2021-07-03 ENCOUNTER — Other Ambulatory Visit: Payer: Self-pay | Admitting: Internal Medicine

## 2021-07-07 DIAGNOSIS — I5042 Chronic combined systolic (congestive) and diastolic (congestive) heart failure: Secondary | ICD-10-CM | POA: Diagnosis not present

## 2021-07-07 DIAGNOSIS — I10 Essential (primary) hypertension: Secondary | ICD-10-CM

## 2021-07-08 ENCOUNTER — Ambulatory Visit (INDEPENDENT_AMBULATORY_CARE_PROVIDER_SITE_OTHER): Payer: PPO | Admitting: Pharmacist

## 2021-07-08 DIAGNOSIS — E034 Atrophy of thyroid (acquired): Secondary | ICD-10-CM

## 2021-07-08 DIAGNOSIS — F331 Major depressive disorder, recurrent, moderate: Secondary | ICD-10-CM

## 2021-07-08 DIAGNOSIS — M3500 Sicca syndrome, unspecified: Secondary | ICD-10-CM

## 2021-07-08 DIAGNOSIS — I10 Essential (primary) hypertension: Secondary | ICD-10-CM

## 2021-07-08 DIAGNOSIS — E782 Mixed hyperlipidemia: Secondary | ICD-10-CM

## 2021-07-08 DIAGNOSIS — I5042 Chronic combined systolic (congestive) and diastolic (congestive) heart failure: Secondary | ICD-10-CM

## 2021-07-08 NOTE — Chronic Care Management (AMB) (Signed)
Chronic Care Management CCM Pharmacy Note  07/08/2021 Name:  Kim Santiago MRN:  [id number] DOB:  1934/12/06  Subjective: Kim Santiago is an 86 y.o. year old female who is a primary patient of Tullo, Aris Everts, MD.  The CCM team was consulted for assistance with disease management and care coordination needs.    Engaged with patient by telephone for initial visit for pharmacy case management and/or care coordination services.   Objective:  Medications Reviewed Today     Reviewed by De Hollingshead, RPH-CPP (Pharmacist) on 07/08/21 at 1033  Med List Status: <None>   Medication Order Taking? Sig Documenting Provider Last Dose Status Informant  albuterol (VENTOLIN HFA) 108 (90 Base) MCG/ACT inhaler [id number] No Inhale 2 puffs into the lungs every 6 (six) hours as needed for wheezing or shortness of breath.  Patient not taking: Reported on 07/08/2021   [name], MD Not Taking Active   atorvastatin (LIPITOR) 20 MG tablet [id number] Yes TAKE 1 TABLET BY MOUTH DAILY AT Sonny Masters, MD Taking Active Multiple Informants  Calcium Carbonate-Vitamin D 600-200 MG-UNIT TABS [id number] Yes Take 2 tablets by mouth daily. [provider] Taking Active Multiple Informants  cholecalciferol (VITAMIN D) 1000 UNITS tablet [id number] Yes Take 2,000 Units by mouth daily. [provider] Taking Active   CRANBERRY PO [id number] Yes Take 1 capsule by mouth 2 (two) times daily. [provider] Taking Active Multiple Informants  diphenhydramine-acetaminophen (TYLENOL PM) 25-500 MG TABS tablet [id number] No Take 1 tablet by mouth at bedtime as needed.  Patient not taking: Reported on 07/08/2021   [provider] Not Taking Active   folic acid (FOLVITE) 1 MG tablet [id number] Yes Take by mouth. [provider] Taking Active Multiple Informants  furosemide (LASIX) 20 MG tablet [id number] Yes 1 TABLET EVERY OTHER DAY AS NEEDED [name], MD Taking Active  Multiple Informants  levothyroxine (SYNTHROID) 75 MCG tablet [id number] Yes TAKE 1 TABLET BY MOUTH (75MCG TOTAL) DAILY [name], MD Taking Active Multiple Informants  metoprolol succinate (TOPROL-XL) 25 MG 24 hr tablet [id number] Yes TAKE 1 TABLET BY MOUTH EVERY DAY [name], MD Taking Active   nitroGLYCERIN (NITROSTAT) 0.4 MG SL tablet [id number] No Place 1 tablet (0.4 mg total) under the tongue every 5 (five) minutes as needed for chest pain. Maximum 3 doses  Patient not taking: Reported on 07/08/2021   [name], MD Not Taking Active Multiple Informants  omeprazole (PRILOSEC) 20 MG capsule [id number] Yes Take 1 capsule (20 mg total) by mouth 2 (two) times daily before a meal. [name], MD Taking Active Multiple Informants           Med Note Sadie Haber Jun 30, 2021  3:05 PM) Takes as needed per patient  polyethylene glycol Merril Abbe / GLYCOLAX) packet [id number] Yes Take 17 g by mouth daily. [provider] Taking Active Multiple Informants  Probiotic Product (PROBIOTIC PO) [id number] Yes Take 1 tablet by mouth daily. [provider] Taking Active Multiple Informants  sertraline (ZOLOFT) 25 MG tablet [id number] Yes Take 25 mg by mouth daily. [provider] Taking Active Multiple Informants  sodium fluoride (PREVIDENT 5000 PLUS) 1.1 % CREA dental cream [id number] Yes Take 1 application by mouth daily.  [provider] Taking Active Multiple Informants  spironolactone (ALDACTONE) 25 MG tablet [id number] Yes Take 1 tablet by mouth daily. [provider] Taking Active   tizanidine (ZANAFLEX) 2 MG capsule  [id number] No Take 1 capsule (2 mg total) by mouth 3 (three) times daily.  Patient not taking: Reported on 07/08/2021   [name], MD Not Taking Active Multiple Informants           Med Note Renard Hamper, Seleta Rhymes   Wed Apr 27, 2018  3:31 PM) PRN  traZODone (DESYREL) 50 MG tablet [id number] No Take 50 mg by mouth at bedtime.   Patient not taking: No sig reported   [provider] Not Taking Active   triamcinolone cream (KENALOG) 0.1 % [id number] Yes APPLY TO AFFECTED AREA TWICE A DAY [provider] Taking Active Multiple Informants            Pertinent Labs:    Lab Results  Component Value Date   CHOL 92 07/23/2020   HDL 31.40 (L) 07/23/2020   LDLCALC 39 07/23/2020   LDLDIRECT 117.6 12/27/2012   TRIG 110.0 07/23/2020   CHOLHDL 3 07/23/2020   Lab Results  Component Value Date   CREATININE 0.73 06/18/2021   BUN 14 06/18/2021   NA 133 (L) 06/18/2021   K 4.4 06/18/2021   CL 98 06/18/2021   CO2 30 06/18/2021     SDOH:  (Social Determinants of Health) assessments and interventions performed: Yes SDOH Interventions    Flowsheet Row Most Recent Value  SDOH Interventions   Financial Strain Interventions Intervention Not Indicated       CCM Care Plan  Review of patient past medical history, allergies, medications, health status, including review of consultants reports, laboratory and other test data, was performed as part of comprehensive evaluation and provision of chronic care management services.   Conditions to be addressed/monitored:  Hyperlipidemia and Heart Failure  Care Plan : Medication Management  Updates made by De Hollingshead, RPH-CPP since 07/08/2021 12:00 AM     Problem: HFpEF, hx MI, CKD, Lupus      Long-Range Goal: Disease Progression Prevention   Start Date: 07/08/2021  This Visit's Progress: On track  Priority: High  Note:   Current Barriers:  Complex patient with multiple comorbidities at high risk for hospitalization  Pharmacist Clinical Goal(s):  patient will achieve adherence to monitoring guidelines and medication adherence to achieve therapeutic efficacy through collaboration with PharmD and provider.   Interventions: 1:1 collaboration with [name], MD regarding development and update of comprehensive plan of care as evidenced  by provider attestation and co-signature Inter-disciplinary care team collaboration (see longitudinal plan of care) Comprehensive medication review performed; medication list updated in electronic medical record  Health Maintenance   Yearly influenza vaccination: up to date Td/Tdap vaccination: up to date Pneumonia vaccination: up to date COVID vaccinations: due for bivalent booster, plan to discuss w/ dermatology given future plans for immune modifying therapy  Shingrix vaccinations: due - discuss moving forward in light of future treatment plans for immune modifying therapy Colonoscopy: up to date Bone density scan: up to date Mammogram: up to date  Heart Failure, preserved:  Appropriately managed; current treatment:  ARNI/ACEi/ARB: d/c at recent hospitalization for hypotension Beta blocker: metoprolol succinate 50 mg daily SGLT2: none Mineralocorticoid Receptor Antagonist: spironolactone 25 mg daily Diuretic: furosemide 20 mg PRN Denies hypotensive symptoms, though does continued to endorse some weakness Confirms understanding of importance of weighing daily. Confirms understanding to contact cardiology/primary care team if weight gain >3 lbs in 1 day or >5 lbs in 1 week Reviewed potassium sparing effect of spironolactone. She notes some periodic swelling in her feet/ankles at the end of  the day that resolves with elevation. Denies SOB or LEE that does not resolve.  Recommended to continue current regimen at this time. Follow for need/ability to add back ACEi/ARB therapy and/or initiation of SGLT2 for dual CKD/CV benefit.   Lupus: Uncontrolled per patient report; current regimen: none; methotrexate discontinued at hospitalization due to immunomodulating impact and concurrent infection. She reports she is having Lupus flares, using triamcinolone 1% PRN Encouraged to contact dermatology for follow up and discussion of treatment. Per their last note, they believed prednisone to be more  concerning for immune suppression than her prior methotrexate dose. She verbalized understanding Vaccines as above based on treatment plan.   Depression/Anxiety/Insomnia:   Inappropriately treated; current treatment: sertraline 25 mg daily, trazodone 50 mg QPM prescribed - patient previously reported some visual hallucinations with it, though appears she was receiving while hospitalized. She is currently taking Tylenol PM Discussed risk of daily Benadryl use in elderly patient, especially with concurrent Sjogrens. Patient would like to retry trazodone. Advised to try first with 1/2 tablet (25 mg QPM) and immediately discontinue if recurrence of visual hallucinations.  Hypothyroidism (s/p thyroidectomy d/t cancer): Controlled; current regimen: levothyroxine 75 mcg daily Recommended to continue current regimen at this time  ASCVD Risk Reduction/Hyperlipidemia: Controlled, current regimen: atorvastatin 20 mg daily  Antiplatelet regimen: unclear benefit given age Recommended to continue current regimen at this time  Supplements: Vitamin D 2000 units daily, calcium 1200 mg daily, cranberry for UTI prevention  Patient Goals/Self-Care Activities patient will:  - take medications as prescribed as evidenced by patient report and record review focus on medication adherence by using pill box check blood pressure periodically, document, and provide at future appointments       Plan: Telephone follow up appointment with care management team member scheduled for:  12 weeks  [name], PharmD, [place], [place] Clinical Pharmacist [employer] [facility] [employer] [phone]

## 2021-07-08 NOTE — Patient Instructions (Signed)
Ms. Kim Santiago,   It was great talking with you today!  Keep an eye on your fluid status and your blood pressure at home. Please let us know if you start to develop persistent swelling in your feet or ankles or new shortness of breath. Also let us know if your blood pressures increase with the top number being greater than 140.   Talk to your dermatologist about your worries about a Lupus flare. Also talk with her about recommendations for vaccines prior to restarting therapy- it may be beneficial to get the new COVID booster and/or your Shingles vaccine BEFORE going back on medication that can decrease your immune response (and how effective these vaccines would be).   Take care!  [name], PharmD  Visit Information   PATIENT GOALS/PLAN OF CARE:  Care Plan : Medication Management  Updates made by De Hollingshead, RPH-CPP since 07/08/2021 12:00 AM     Problem: HFpEF, hx MI, CKD, Lupus      Long-Range Goal: Disease Progression Prevention   Start Date: 07/08/2021  This Visit's Progress: On track  Priority: High  Note:   Current Barriers:  Complex patient with multiple comorbidities at high risk for hospitalization  Pharmacist Clinical Goal(s):  patient will achieve adherence to monitoring guidelines and medication adherence to achieve therapeutic efficacy through collaboration with PharmD and provider.   Interventions: 1:1 collaboration with [name], MD regarding development and update of comprehensive plan of care as evidenced by provider attestation and co-signature Inter-disciplinary care team collaboration (see longitudinal plan of care) Comprehensive medication review performed; medication list updated in electronic medical record  Health Maintenance   Yearly influenza vaccination: up to date Td/Tdap vaccination: up to date Pneumonia vaccination: up to date COVID vaccinations: due for bivalent booster, plan to discuss w/ dermatology given future plans for immune  modifying therapy  Shingrix vaccinations: due - discuss moving forward in light of future treatment plans for immune modifying therapy Colonoscopy: up to date Bone density scan: up to date Mammogram: up to date  Heart Failure, preserved:  Appropriately managed; current treatment:  ARNI/ACEi/ARB: d/c at recent hospitalization for hypotension Beta blocker: metoprolol succinate 50 mg daily SGLT2: none Mineralocorticoid Receptor Antagonist: spironolactone 25 mg daily Diuretic: furosemide 20 mg PRN Denies hypotensive symptoms, though does continued to endorse some weakness Confirms understanding of importance of weighing daily. Confirms understanding to contact cardiology/primary care team if weight gain >3 lbs in 1 day or >5 lbs in 1 week Reviewed potassium sparing effect of spironolactone. She notes some periodic swelling in her feet/ankles at the end of the day that resolves with elevation. Denies SOB or LEE that does not resolve.  Recommended to continue current regimen at this time. Follow for need/ability to add back ACEi/ARB therapy and/or initiation of SGLT2 for dual CKD/CV benefit.   Lupus: Uncontrolled per patient report; current regimen: none; methotrexate discontinued at hospitalization due to immunomodulating impact and concurrent infection. She reports she is having Lupus flares, using triamcinolone 1% PRN Encouraged to contact dermatology for follow up and discussion of treatment. Per their last note, they believed prednisone to be more concerning for immune suppression than her prior methotrexate dose. She verbalized understanding Vaccines as above based on treatment plan.   Depression/Anxiety/Insomnia:   Inappropriately treated; current treatment: sertraline 25 mg daily, trazodone 50 mg QPM prescribed - patient previously reported some visual hallucinations with it, though appears she was receiving while hospitalized. She is currently taking Tylenol PM Discussed risk of daily  Benadryl  use in elderly patient, especially with concurrent Sjogrens. Patient would like to retry trazodone. Advised to try first with 1/2 tablet (25 mg QPM) and immediately discontinue if recurrence of visual hallucinations.  Hypothyroidism (s/p thyroidectomy d/t cancer): Controlled; current regimen: levothyroxine 75 mcg daily Recommended to continue current regimen at this time  ASCVD Risk Reduction/Hyperlipidemia: Controlled, current regimen: atorvastatin 20 mg daily  Antiplatelet regimen: unclear benefit given age Recommended to continue current regimen at this time  Supplements: Vitamin D 2000 units daily, calcium 1200 mg daily, cranberry for UTI prevention  Patient Goals/Self-Care Activities patient will:  - take medications as prescribed as evidenced by patient report and record review focus on medication adherence by using pill box check blood pressure periodically, document, and provide at future appointments      Consent to CCM Services: Kim Santiago was given information about Chronic Care Management services including:  CCM service includes personalized support from designated clinical staff supervised by her physician, including individualized plan of care and coordination with other care providers 24/7 contact phone numbers for assistance for urgent and routine care needs. Service will only be billed when office clinical staff spend 20 minutes or more in a month to coordinate care. Only one practitioner may furnish and bill the service in a calendar month. The patient may stop CCM services at any time (effective at the end of the month) by phone call to the office staff. The patient will be responsible for cost sharing (co-pay) of up to 20% of the service fee (after annual deductible is met).  Patient agreed to services and verbal consent obtained.   Patient verbalizes understanding of instructions provided today and agrees to view in [place].    Plan: Telephone  follow up appointment with care management team member scheduled for:  14 weeks  [name], PharmD, [place], [place] Clinical Pharmacist [employer] [facility] [employer] [phone]

## 2021-07-09 DIAGNOSIS — Z79899 Other long term (current) drug therapy: Secondary | ICD-10-CM | POA: Diagnosis not present

## 2021-07-09 DIAGNOSIS — M810 Age-related osteoporosis without current pathological fracture: Secondary | ICD-10-CM | POA: Diagnosis not present

## 2021-07-09 DIAGNOSIS — K219 Gastro-esophageal reflux disease without esophagitis: Secondary | ICD-10-CM | POA: Diagnosis not present

## 2021-07-09 DIAGNOSIS — M3500 Sicca syndrome, unspecified: Secondary | ICD-10-CM | POA: Diagnosis not present

## 2021-07-09 DIAGNOSIS — J9601 Acute respiratory failure with hypoxia: Secondary | ICD-10-CM | POA: Diagnosis not present

## 2021-07-09 DIAGNOSIS — I119 Hypertensive heart disease without heart failure: Secondary | ICD-10-CM | POA: Diagnosis not present

## 2021-07-09 DIAGNOSIS — U071 COVID-19: Secondary | ICD-10-CM | POA: Diagnosis not present

## 2021-07-09 DIAGNOSIS — F419 Anxiety disorder, unspecified: Secondary | ICD-10-CM | POA: Diagnosis not present

## 2021-07-09 DIAGNOSIS — R627 Adult failure to thrive: Secondary | ICD-10-CM | POA: Diagnosis not present

## 2021-07-09 DIAGNOSIS — M1990 Unspecified osteoarthritis, unspecified site: Secondary | ICD-10-CM | POA: Diagnosis not present

## 2021-07-09 DIAGNOSIS — Z9181 History of falling: Secondary | ICD-10-CM | POA: Diagnosis not present

## 2021-07-09 DIAGNOSIS — D539 Nutritional anemia, unspecified: Secondary | ICD-10-CM | POA: Diagnosis not present

## 2021-07-09 DIAGNOSIS — Z87440 Personal history of urinary (tract) infections: Secondary | ICD-10-CM | POA: Diagnosis not present

## 2021-07-09 DIAGNOSIS — J1282 Pneumonia due to coronavirus disease 2019: Secondary | ICD-10-CM | POA: Diagnosis not present

## 2021-07-09 DIAGNOSIS — Z9981 Dependence on supplemental oxygen: Secondary | ICD-10-CM | POA: Diagnosis not present

## 2021-07-09 DIAGNOSIS — Z7952 Long term (current) use of systemic steroids: Secondary | ICD-10-CM | POA: Diagnosis not present

## 2021-07-09 DIAGNOSIS — E890 Postprocedural hypothyroidism: Secondary | ICD-10-CM | POA: Diagnosis not present

## 2021-07-09 DIAGNOSIS — I252 Old myocardial infarction: Secondary | ICD-10-CM | POA: Diagnosis not present

## 2021-07-09 DIAGNOSIS — L931 Subacute cutaneous lupus erythematosus: Secondary | ICD-10-CM | POA: Diagnosis not present

## 2021-07-09 DIAGNOSIS — E871 Hypo-osmolality and hyponatremia: Secondary | ICD-10-CM | POA: Diagnosis not present

## 2021-07-09 DIAGNOSIS — E785 Hyperlipidemia, unspecified: Secondary | ICD-10-CM | POA: Diagnosis not present

## 2021-07-09 DIAGNOSIS — L719 Rosacea, unspecified: Secondary | ICD-10-CM | POA: Diagnosis not present

## 2021-07-09 DIAGNOSIS — Z853 Personal history of malignant neoplasm of breast: Secondary | ICD-10-CM | POA: Diagnosis not present

## 2021-07-09 DIAGNOSIS — M6281 Muscle weakness (generalized): Secondary | ICD-10-CM | POA: Diagnosis not present

## 2021-07-09 DIAGNOSIS — I2510 Atherosclerotic heart disease of native coronary artery without angina pectoris: Secondary | ICD-10-CM | POA: Diagnosis not present

## 2021-07-13 ENCOUNTER — Other Ambulatory Visit: Payer: Self-pay | Admitting: Internal Medicine

## 2021-07-15 DIAGNOSIS — I69354 Hemiplegia and hemiparesis following cerebral infarction affecting left non-dominant side: Secondary | ICD-10-CM | POA: Diagnosis not present

## 2021-07-15 DIAGNOSIS — Z66 Do not resuscitate: Secondary | ICD-10-CM | POA: Diagnosis not present

## 2021-07-15 DIAGNOSIS — M329 Systemic lupus erythematosus, unspecified: Secondary | ICD-10-CM | POA: Diagnosis not present

## 2021-07-15 DIAGNOSIS — I10 Essential (primary) hypertension: Secondary | ICD-10-CM | POA: Diagnosis not present

## 2021-07-28 ENCOUNTER — Ambulatory Visit: Payer: PPO | Admitting: *Deleted

## 2021-07-28 DIAGNOSIS — I5042 Chronic combined systolic (congestive) and diastolic (congestive) heart failure: Secondary | ICD-10-CM

## 2021-07-28 DIAGNOSIS — I872 Venous insufficiency (chronic) (peripheral): Secondary | ICD-10-CM

## 2021-07-28 DIAGNOSIS — I10 Essential (primary) hypertension: Secondary | ICD-10-CM

## 2021-08-01 ENCOUNTER — Other Ambulatory Visit: Payer: Self-pay | Admitting: Internal Medicine

## 2021-08-03 NOTE — Chronic Care Management (AMB) (Signed)
Chronic Care Management   CCM RN Visit Note  08/03/2021 Name: Kim Santiago MRN: [id number] DOB: [birth year]  Subjective: Kim Santiago is a 86 y.o. year old female who is a primary care patient of [name], MD. The care management team was consulted for assistance with disease management and care coordination needs.    Engaged with patient by telephone for follow up visit in response to provider referral for case management and/or care coordination services.   Consent to Services:  The patient was given information about Chronic Care Management services, agreed to services, and gave verbal consent prior to initiation of services.  Please see initial visit note for detailed documentation.   Patient agreed to services and verbal consent obtained.   Assessment: Review of patient past medical history, allergies, medications, health status, including review of consultants reports, laboratory and other test data, was performed as part of comprehensive evaluation and provision of chronic care management services.   SDOH (Social Determinants of Health) assessments and interventions performed:    CCM Care Plan  Allergies  Allergen Reactions   Azathioprine Nausea And Vomiting    Severe vomiting   Hydroxychloroquine Hives and Nausea And Vomiting   Mycophenolate Mofetil Nausea Only   Amoxicillin Rash    Has patient had a PCN reaction causing immediate rash, facial/tongue/throat swelling, SOB or lightheadedness with hypotension: Unknown Has patient had a PCN reaction causing severe rash involving mucus membranes or skin necrosis: Unknown Has patient had a PCN reaction that required hospitalization: Unknown Has patient had a PCN reaction occurring within the last 10 years: Unknown If all of the above answers are "NO", then may proceed with Cephalosporin use. Tolerates ceftriaxone and Ancef   Codeine Nausea And Vomiting   Naprosyn [Naproxen] Swelling   Orudis [Ketoprofen] Hives    Sulfa Antibiotics Rash   Sulfathiazole Rash    Outpatient Encounter Medications as of 07/28/2021  Medication Sig Note   albuterol (VENTOLIN HFA) 108 (90 Base) MCG/ACT inhaler Inhale 2 puffs into the lungs every 6 (six) hours as needed for wheezing or shortness of breath. (Patient not taking: Reported on 07/08/2021)    atorvastatin (LIPITOR) 20 MG tablet TAKE 1 TABLET BY MOUTH DAILY AT 6PM    Calcium Carbonate-Vitamin D 600-200 MG-UNIT TABS Take 2 tablets by mouth daily.    cholecalciferol (VITAMIN D) 1000 UNITS tablet Take 2,000 Units by mouth daily.    CRANBERRY PO Take 1 capsule by mouth 2 (two) times daily.    diphenhydramine-acetaminophen (TYLENOL PM) 25-500 MG TABS tablet Take 1 tablet by mouth at bedtime as needed. (Patient not taking: Reported on 07/08/2021)    levothyroxine (SYNTHROID) 75 MCG tablet TAKE 1 TABLET BY MOUTH (75MCG TOTAL) DAILY    metoprolol succinate (TOPROL-XL) 25 MG 24 hr tablet TAKE 1 TABLET BY MOUTH EVERY DAY    nitroGLYCERIN (NITROSTAT) 0.4 MG SL tablet Place 1 tablet (0.4 mg total) under the tongue every 5 (five) minutes as needed for chest pain. Maximum 3 doses (Patient not taking: Reported on 07/08/2021)    omeprazole (PRILOSEC) 20 MG capsule Take 1 capsule (20 mg total) by mouth 2 (two) times daily before a meal. 06/30/2021: Takes as needed per patient   polyethylene glycol (MIRALAX / GLYCOLAX) packet Take 17 g by mouth daily.    Probiotic Product (PROBIOTIC PO) Take 1 tablet by mouth daily.    sertraline (ZOLOFT) 25 MG tablet Take 25 mg by mouth daily.    sodium fluoride (PREVIDENT 5000 PLUS) 1.1 %  CREA dental cream Take 1 application by mouth daily.     spironolactone (ALDACTONE) 25 MG tablet Take 1 tablet by mouth daily.    tizanidine (ZANAFLEX) 2 MG capsule Take 1 capsule (2 mg total) by mouth 3 (three) times daily. (Patient not taking: Reported on 07/08/2021) 04/27/2018: PRN   traZODone (DESYREL) 50 MG tablet TAKE 1 TABLET BY MOUTH EVERYDAY AT BEDTIME     triamcinolone cream (KENALOG) 0.1 % APPLY TO AFFECTED AREA TWICE A DAY    [DISCONTINUED] furosemide (LASIX) 20 MG tablet 1 TABLET EVERY OTHER DAY AS NEEDED    No facility-administered encounter medications on file as of 07/28/2021.    Patient Active Problem List   Diagnosis Date Noted   Chronic combined systolic and diastolic heart failure ([place]) 06/19/2021   COVID-19 virus infection 04/20/2021   Generalized weakness 11/23/2020   Vaccine reaction, initial encounter 08/20/2020   Secondary and unspecified malignant neoplasm of axilla and upper limb lymph nodes ([place]) 01/16/2020   Other drug-induced neutropenia ([place]) 01/16/2020   Hemiplegia affecting dominant side, post-stroke ([place]) 01/16/2020   Does use hearing aid 07/14/2019   Hypothyroidism due to acquired atrophy of thyroid 04/08/2019   Impaired balance as late effect of cerebrovascular accident 11/13/2018   History of CVA with residual deficit 11/13/2018   Leukopenia 11/11/2018   Anxiety about health 07/05/2018   Mediastinal adenopathy 04/27/2018   Edema due to hypoalbuminemia 02/20/2018   Hospital discharge follow-up 02/20/2018   History of vertebral fracture 02/18/2018   GERD (gastroesophageal reflux disease) 10/31/2017   Hand joint pain 06/13/2017   Vaginal atrophy 05/11/2017   Venous insufficiency of both lower extremities 01/10/2017   Discoid lupus 06/26/2016   Anemia 05/24/2016   Recurrent UTI 04/29/2016   TMJ syndrome 03/31/2016   Hearing loss 03/12/2016   Osteopenia 12/05/2015   Malignant neoplasm of left female breast ([place]) 11/26/2015   Hx of thyroid cancer 06/12/2014   Adenomatous polyp 06/12/2014   Major depressive disorder, recurrent episode, moderate ([place]) 05/29/2014   Grief 04/08/2014   Sjogren's syndrome ([place]) 03/09/2014   Insomnia due to stress 03/06/2014   Hyperlipidemia 12/28/2012   Medicare annual wellness visit, subsequent 12/28/2012   Essential hypertension 10/02/2011   Iatrogenic hypothyroidism  10/02/2011   Purpura ([place]) 05/25/2011    Conditions to be addressed/monitored:Atrial Fibrillation and HTN  Care Plan : CCM RNCM Care [place]  Updates made by Leona Singleton, RN since 08/03/2021 12:00 AM     Problem: Knowledge deficiet related to management of CHF, HTN   Priority: Medium     Long-Range Goal: Patient will work with CM team to gain knowledge of self health care knowledge and management   Start Date: 06/30/2021  Expected End Date: 12/05/2021  This Visit's Progress: On track  Priority: Medium  Note:   Current Barriers:  Knowledge Deficits related to plan of care for management of CHF and HTN   reports weighing daily, weight 130 pounds.  Monitors blood pressures occasionally with systollic rangung [phone]'K (unsure of diastolic)  Denies any chest pain or shortness of breath or swelling  RNCM Clinical Goal(s):  Patient will verbalize understanding of plan for management of CHF and HTN  verbalize basic understanding of CHF and HTN disease process and self health management plan as previous take all medications exactly as prescribed and will call provider for medication related questions through collaboration with RN Care manager, provider, and care team.   Interventions: 1:1 collaboration with primary care provider regarding development and update of comprehensive  plan of care as evidenced by provider attestation and co-signature Inter-disciplinary care team collaboration (see longitudinal plan of care) Evaluation of current treatment plan related to  self management and patient's adherence to plan as established by provider   Heart Failure Interventions:  (Status: Goal on Track (progressing): YES.)   Long Term Goal Basic overview and discussion of pathophysiology of Heart Failure reviewed; Provided education on low sodium diet; Reviewed Heart Failure Action Plan in depth and provided written copy; Assessed need for readable accurate scales in home; Provided education  about placing scale on hard, flat surface; Advised patient to weigh each morning after emptying bladder; Discussed importance of daily weight and advised patient to weigh and record daily; Reviewed role of diuretics in prevention of fluid overload and management of heart failure; Discussed the importance of keeping all appointments with provider;  Hypertension: (Status: New goal. Goal on Track (progressing): YES.)   Long Term Goal Last practice recorded BP readings:  BP Readings from Last 3 Encounters:  06/18/21 128/66  05/20/21 124/72  05/13/21 (!) 112/57  Most recent eGFR/CrCl: No results found for: EGFR  No components found for: CRCL  Evaluation of current treatment plan related to hypertension self management and patient's adherence to plan as established by provider;   Provided education to patient re: stroke prevention, s/s of heart attack and stroke; Reviewed prescribed diet low salt Reviewed medications with patient and discussed importance of compliance;  Counseled on adverse effects of illicit drug and excessive alcohol use in patients with high blood pressure;  Counseled on the importance of exercise goals with target of 150 minutes per week Discussed plans with patient for ongoing care management follow up and provided patient with direct contact information for care management team; Screening for signs and symptoms of depression related to chronic disease state;  Assessed social determinant of health barriers;   Patient Goals/Self-Care Activities: Patient will self administer medications as prescribed as evidenced by self report/primary caregiver report  Patient will attend all scheduled provider appointments as evidenced by clinician review of documented attendance to scheduled appointments and patient/caregiver report Call office if I gain more than 2 pounds in one day or 5 pounds in one week Keep legs up while sitting Use salt in moderation Watch for swelling in feet,  ankles and legs every day Weigh myself daily writing in log Check blood pressures at least once a week keeping log for provider review         Plan:The care management team will reach out to the patient again over the next 45 days.  Hubert Azure RN, MSN RN Care Management Coordinator [place] [phone] [email or web address]

## 2021-08-03 NOTE — Patient Instructions (Signed)
Visit Information  Thank you for taking time to visit with me today. Please don't hesitate to contact me if I can be of assistance to you before our next scheduled telephone appointment.  Following are the goals we discussed today:  Patient will self administer medications as prescribed as evidenced by self report/primary caregiver report  Patient will attend all scheduled provider appointments as evidenced by clinician review of documented attendance to scheduled appointments and patient/caregiver report Call office if I gain more than 2 pounds in one day or 5 pounds in one week Keep legs up while sitting Use salt in moderation Watch for swelling in feet, ankles and legs every day Weigh myself daily writing in log Check blood pressures at least once a week keeping log for provider review  Our next appointment is by telephone on 08/27/21 at 0930  Please call the care guide team at [phone] if you need to cancel or reschedule your appointment.   Please call the Suicide and Crisis Lifeline: 988 call the [place] National Suicide Prevention Lifeline: 682-222-0995 or TTY: 971-260-5152 TTY (301)504-9072) to talk to a trained counselor call 1-800-273-TALK (toll free, 24 hour hotline) call 911 if you are experiencing a Mental Health or [place] or need someone to talk to.  Patient verbalizes understanding of instructions provided today and agrees to view in [place].   Hubert Azure RN, MSN RN Care Management Coordinator [place] [phone] [email or web address]

## 2021-08-06 DIAGNOSIS — I110 Hypertensive heart disease with heart failure: Secondary | ICD-10-CM | POA: Diagnosis not present

## 2021-08-06 DIAGNOSIS — I5042 Chronic combined systolic (congestive) and diastolic (congestive) heart failure: Secondary | ICD-10-CM | POA: Diagnosis not present

## 2021-08-06 DIAGNOSIS — E034 Atrophy of thyroid (acquired): Secondary | ICD-10-CM | POA: Diagnosis not present

## 2021-08-06 DIAGNOSIS — F331 Major depressive disorder, recurrent, moderate: Secondary | ICD-10-CM

## 2021-08-06 DIAGNOSIS — E782 Mixed hyperlipidemia: Secondary | ICD-10-CM | POA: Diagnosis not present

## 2021-08-08 DIAGNOSIS — L932 Other local lupus erythematosus: Secondary | ICD-10-CM | POA: Diagnosis not present

## 2021-08-23 ENCOUNTER — Other Ambulatory Visit: Payer: Self-pay | Admitting: Internal Medicine

## 2021-08-27 ENCOUNTER — Ambulatory Visit (INDEPENDENT_AMBULATORY_CARE_PROVIDER_SITE_OTHER): Payer: PPO | Admitting: *Deleted

## 2021-08-27 ENCOUNTER — Other Ambulatory Visit: Payer: Self-pay

## 2021-08-27 ENCOUNTER — Encounter: Payer: Self-pay | Admitting: Internal Medicine

## 2021-08-27 ENCOUNTER — Ambulatory Visit (INDEPENDENT_AMBULATORY_CARE_PROVIDER_SITE_OTHER): Payer: PPO | Admitting: Internal Medicine

## 2021-08-27 VITALS — BP 126/62 | HR 86 | Temp 98.2°F | Ht 62.0 in | Wt 136.8 lb

## 2021-08-27 DIAGNOSIS — I69359 Hemiplegia and hemiparesis following cerebral infarction affecting unspecified side: Secondary | ICD-10-CM | POA: Diagnosis not present

## 2021-08-27 DIAGNOSIS — R531 Weakness: Secondary | ICD-10-CM | POA: Diagnosis not present

## 2021-08-27 DIAGNOSIS — I69398 Other sequelae of cerebral infarction: Secondary | ICD-10-CM

## 2021-08-27 DIAGNOSIS — I10 Essential (primary) hypertension: Secondary | ICD-10-CM

## 2021-08-27 DIAGNOSIS — M546 Pain in thoracic spine: Secondary | ICD-10-CM | POA: Diagnosis not present

## 2021-08-27 DIAGNOSIS — D702 Other drug-induced agranulocytosis: Secondary | ICD-10-CM | POA: Diagnosis not present

## 2021-08-27 DIAGNOSIS — R2689 Other abnormalities of gait and mobility: Secondary | ICD-10-CM | POA: Diagnosis not present

## 2021-08-27 DIAGNOSIS — I5032 Chronic diastolic (congestive) heart failure: Secondary | ICD-10-CM

## 2021-08-27 DIAGNOSIS — L930 Discoid lupus erythematosus: Secondary | ICD-10-CM

## 2021-08-27 DIAGNOSIS — I5042 Chronic combined systolic (congestive) and diastolic (congestive) heart failure: Secondary | ICD-10-CM

## 2021-08-27 NOTE — Patient Instructions (Signed)
Visit Information  Thank you for taking time to visit with me today. Please don't hesitate to contact me if I can be of assistance to you before our next scheduled telephone appointment.  Following are the goals we discussed today:  (Copy and paste patient goals from clinical care plan here)  Our next appointment is by telephone on 10/08/21 at 1000  Please call the care guide team at [phone] if you need to cancel or reschedule your appointment.   If you are experiencing a Mental Health or [place] or need someone to talk to, please call the Suicide and Crisis Lifeline: 988 call the [place] National Suicide Prevention Lifeline: 4121133171 or TTY: (719)022-5431 TTY (916) 200-0298) to talk to a trained counselor call 1-800-273-TALK (toll free, 24 hour hotline) go to [facility] [address] [place] [phone]) call 911   Patient verbalizes understanding of instructions provided today and agrees to view in [place].   Hubert Azure RN, MSN RN Care Management Coordinator [place] [phone] [email or web address]

## 2021-08-27 NOTE — Patient Instructions (Addendum)
You can alternate between furosemide and spironolactone  daily  We will repeat your labs including your  potassium level in January   For your back pain;  I have ordered home PT   You can take up to 2000 mg of acetominophen (tylenol) every day safely  In divided doses (500 mg every 6 hours  Or 1000 mg every 12 hours.)   start with 500 mg every 12 hours

## 2021-08-27 NOTE — Progress Notes (Signed)
Subjective:  Patient ID: Kim Santiago, female    DOB: [birth year]  Age: 86 y.o. MRN: [id number]  CC: The primary encounter diagnosis was Other drug-induced neutropenia ([place]). Diagnoses of Chronic diastolic heart failure ([place]), Left-sided thoracic back pain, unspecified chronicity, Generalized weakness, Impaired balance as late effect of cerebrovascular accident, and Hemiplegia affecting dominant side, post-stroke [facility] were also pertinent to this visit.  HPI [name] presents for  Chief Complaint  Patient presents with   Follow-up   This visit occurred during the SARS-CoV-2 public health emergency.  Safety protocols were in place, including screening questions prior to the visit, additional usage of staff PPE, and extensive cleaning of exam room while observing appropriate contact time as indicated for disinfecting solutions.    Kim Santiago is an 86 yr old female with chronic diastolic heart failure, hypertension, and Sjogren's Syndrome who is here for follow up on multiple issues.   She has been taking two diuretics daily , furosemide and spironolactone .  Not sleepig well due to nocturia.  Wants to take both in the am because sleep is interrupted by voiding .  Reviewed ECHO with patient.  No indication for dual diuretics daily.  Advised her to alternte.   She has been having low back pain brought on by standing  for prolonged periods of time,  not when sitting or lying down,  and has left sided mid thoracic pain that inmroves with massage    Outpatient Medications Prior to Visit  Medication Sig Dispense Refill   atorvastatin (LIPITOR) 20 MG tablet TAKE 1 TABLET BY MOUTH DAILY AT 6PM 90 tablet 1   Calcium Carbonate-Vitamin D 600-200 MG-UNIT TABS Take 2 tablets by mouth daily.     cholecalciferol (VITAMIN D) 1000 UNITS tablet Take 2,000 Units by mouth daily.     CRANBERRY PO Take 1 capsule by mouth 2 (two) times daily.     folic acid (FOLVITE) 1 MG tablet Take 1 mg by mouth daily.      furosemide (LASIX) 20 MG tablet 1 TABLET EVERY OTHER DAY AS NEEDED 45 tablet 2   levothyroxine (SYNTHROID) 75 MCG tablet TAKE 1 TABLET BY MOUTH (75MCG TOTAL) DAILY 90 tablet 2   losartan (COZAAR) 25 MG tablet Take 25 mg by mouth daily.     methotrexate (RHEUMATREX) 2.5 MG tablet Take 15 mg by mouth once a week.     metoprolol succinate (TOPROL-XL) 25 MG 24 hr tablet TAKE 1 TABLET BY MOUTH EVERY DAY 90 tablet 3   nitroGLYCERIN (NITROSTAT) 0.4 MG SL tablet Place 1 tablet (0.4 mg total) under the tongue every 5 (five) minutes as needed for chest pain. Maximum 3 doses 50 tablet 3   omeprazole (PRILOSEC) 20 MG capsule TAKE 1 CAPSULE (20 MG TOTAL) BY MOUTH 2 (TWO) TIMES DAILY BEFORE A MEAL. 180 capsule 1   polyethylene glycol (MIRALAX / GLYCOLAX) packet Take 17 g by mouth daily.     predniSONE (DELTASONE) 5 MG tablet Take by mouth.     Probiotic Product (PROBIOTIC PO) Take 1 tablet by mouth daily.     sertraline (ZOLOFT) 25 MG tablet Take 25 mg by mouth daily.     sodium fluoride (PREVIDENT 5000 PLUS) 1.1 % CREA dental cream Take 1 application by mouth daily.      spironolactone (ALDACTONE) 25 MG tablet Take 1 tablet by mouth daily.     tizanidine (ZANAFLEX) 2 MG capsule Take 1 capsule (2 mg total) by mouth 3 (three) times daily. [place]  capsule 0   traZODone (DESYREL) 50 MG tablet TAKE 1 TABLET BY MOUTH EVERYDAY AT BEDTIME 90 tablet 1   triamcinolone cream (KENALOG) 0.1 % APPLY TO AFFECTED AREA TWICE A DAY     diphenhydramine-acetaminophen (TYLENOL PM) 25-500 MG TABS tablet Take 1 tablet by mouth at bedtime as needed. (Patient not taking: Reported on 07/08/2021)     albuterol (VENTOLIN HFA) 108 (90 Base) MCG/ACT inhaler Inhale 2 puffs into the lungs every 6 (six) hours as needed for wheezing or shortness of breath. (Patient not taking: Reported on 07/08/2021) 3.7 g 11   No facility-administered medications prior to visit.    Review of Systems;  Patient denies headache, fevers, malaise, unintentional  weight loss, skin rash, eye pain, sinus congestion and sinus pain, sore throat, dysphagia,  hemoptysis , cough, dyspnea, wheezing, chest pain, palpitations, orthopnea, edema, abdominal pain, nausea, melena, diarrhea, constipation, flank pain, dysuria, hematuria, urinary  Frequency, nocturia, numbness, tingling, seizures,  Focal weakness, Loss of consciousness,  Tremor, insomnia, depression, anxiety, and suicidal ideation.      Objective:  BP 126/62 (BP Location: Left Arm, Patient Position: Sitting, Cuff Size: Normal)    Pulse 86    Temp 98.2 F (36.8 C) (Oral)    Ht 5\' 2"  (1.575 m)    Wt 136 lb 12.8 oz (62.1 kg)    SpO2 99%    BMI 25.02 kg/m   BP Readings from Last 3 Encounters:  08/27/21 126/62  06/18/21 128/66  05/20/21 124/72    Wt Readings from Last 3 Encounters:  08/27/21 136 lb 12.8 oz (62.1 kg)  06/18/21 135 lb 3.2 oz (61.3 kg)  05/20/21 135 lb 3.2 oz (61.3 kg)    General appearance: alert, cooperative and appears stated age Ears: normal TM's and external ear canals both ears Throat: lips, mucosa, and tongue normal; teeth and gums normal Neck: no adenopathy, no carotid bruit, supple, symmetrical, trachea midline and thyroid not enlarged, symmetric, no tenderness/mass/nodules Back: symmetric, no curvature. ROM normal. No CVA tenderness.  Paraspinous muscle spasm left mid thoracic spine  Lungs: clear to auscultation bilaterally Heart: regular rate and rhythm, S1, S2 normal, no murmur, click, rub or gallop Abdomen: soft, non-tender; bowel sounds normal; no masses,  no organomegaly Pulses: 2+ and symmetric Skin: Skin color, texture, turgor normal. No rashes or lesions Lymph nodes: Cervical, supraclavicular, and axillary nodes normal.  Lab Results  Component Value Date   HGBA1C 4.9 11/08/2018    Lab Results  Component Value Date   CREATININE 0.73 06/18/2021   CREATININE 0.82 05/20/2021   CREATININE 0.75 05/11/2021    Lab Results  Component Value Date   WBC 8.7  05/20/2021   HGB 11.1 (L) 05/20/2021   HCT 32.0 (L) 05/20/2021   PLT 240.0 05/20/2021   GLUCOSE 88 06/18/2021   CHOL 92 07/23/2020   TRIG 110.0 07/23/2020   HDL 31.40 (L) 07/23/2020   LDLDIRECT 117.6 12/27/2012   LDLCALC 39 07/23/2020   ALT 8 05/11/2021   AST 23 05/11/2021   NA 133 (L) 06/18/2021   K 4.4 06/18/2021   CL 98 06/18/2021   CREATININE 0.73 06/18/2021   BUN 14 06/18/2021   CO2 30 06/18/2021   TSH 1.630 05/09/2021   INR 1.0 10/10/2019   HGBA1C 4.9 11/08/2018    Assessment & Plan:   Problem List Items Addressed This Visit     Impaired balance as late effect of cerebrovascular accident   Relevant Orders   Ambulatory referral to [facility]  Other drug-induced neutropenia ([place]) - Primary    Resolved by repeat CBC    Lab Results  Component Value Date   WBC 8.7 05/20/2021   HGB 11.1 (L) 05/20/2021   HCT 32.0 (L) 05/20/2021   MCV 100.7 (H) 05/20/2021   PLT 240.0 05/20/2021         Relevant Orders   CBC with Differential/Platelet   Hemiplegia affecting dominant side, post-stroke ([place])    She has developed weakness in the affected leg and wants to resume home PT        Generalized weakness    Improving gradually, but needs home PT , which was ordered today       Chronic diastolic heart failure ([place])    Recommend alternating diuretics daily to avoid excessive diuresis.      Relevant Orders   Comprehensive metabolic panel   Other Visit Diagnoses     Left-sided thoracic back pain, unspecified chronicity       Relevant Orders   Ambulatory referral to [place]       I have discontinued Anis R. Fickle "Emma"'s albuterol. I am also having her maintain her cholecalciferol, Calcium Carbonate-Vitamin D, polyethylene glycol, tizanidine, Probiotic Product (PROBIOTIC PO), nitroGLYCERIN, CRANBERRY PO, sodium fluoride, atorvastatin, levothyroxine, triamcinolone cream, sertraline, spironolactone, diphenhydramine-acetaminophen, metoprolol succinate,  traZODone, furosemide, omeprazole, methotrexate, predniSONE, folic acid, and losartan.  No orders of the defined types were placed in this encounter.    I provided  30 minutes of  face-to-face time during this encounter reviewing patient's current problems and past surgeries, labs and imaging studies, providing counseling on the above mentioned problems , and coordination  of care .   Follow-up: No follow-ups on file.   [name], MD

## 2021-08-28 NOTE — Chronic Care Management (AMB) (Signed)
Chronic Care Management   CCM RN Visit Note  08/28/2021 Name: Kim Santiago MRN: [id number] DOB: May 20, 1935  Subjective: Kim Santiago is a 86 y.o. year old female who is a primary care patient of [name], MD. The care management team was consulted for assistance with disease management and care coordination needs.    Engaged with patient by telephone for follow up visit in response to provider referral for case management and/or care coordination services.   Consent to Services:  The patient was given information about Chronic Care Management services, agreed to services, and gave verbal consent prior to initiation of services.  Please see initial visit note for detailed documentation.   Patient agreed to services and verbal consent obtained.   Assessment: Review of patient past medical history, allergies, medications, health status, including review of consultants reports, laboratory and other test data, was performed as part of comprehensive evaluation and provision of chronic care management services.   SDOH (Social Determinants of Health) assessments and interventions performed:    CCM Care Plan  Allergies  Allergen Reactions   Azathioprine Nausea And Vomiting    Severe vomiting   Hydroxychloroquine Hives and Nausea And Vomiting   Mycophenolate Mofetil Nausea Only   Amoxicillin Rash    Has patient had a PCN reaction causing immediate rash, facial/tongue/throat swelling, SOB or lightheadedness with hypotension: Unknown Has patient had a PCN reaction causing severe rash involving mucus membranes or skin necrosis: Unknown Has patient had a PCN reaction that required hospitalization: Unknown Has patient had a PCN reaction occurring within the last 10 years: Unknown If all of the above answers are "NO", then may proceed with Cephalosporin use. Tolerates ceftriaxone and Ancef   Codeine Nausea And Vomiting   Naprosyn [Naproxen] Swelling   Orudis [Ketoprofen] Hives    Sulfa Antibiotics Rash   Sulfathiazole Rash    Outpatient Encounter Medications as of 08/27/2021  Medication Sig Note   methotrexate (RHEUMATREX) 2.5 MG tablet Take 15 mg by mouth once a week.    predniSONE (DELTASONE) 5 MG tablet Take by mouth.    atorvastatin (LIPITOR) 20 MG tablet TAKE 1 TABLET BY MOUTH DAILY AT 6PM    Calcium Carbonate-Vitamin D 600-200 MG-UNIT TABS Take 2 tablets by mouth daily.    cholecalciferol (VITAMIN D) 1000 UNITS tablet Take 2,000 Units by mouth daily.    CRANBERRY PO Take 1 capsule by mouth 2 (two) times daily.    diphenhydramine-acetaminophen (TYLENOL PM) 25-500 MG TABS tablet Take 1 tablet by mouth at bedtime as needed. (Patient not taking: Reported on 41/03/4080)    folic acid (FOLVITE) 1 MG tablet Take 1 mg by mouth daily.    furosemide (LASIX) 20 MG tablet 1 TABLET EVERY OTHER DAY AS NEEDED    levothyroxine (SYNTHROID) 75 MCG tablet TAKE 1 TABLET BY MOUTH (75MCG TOTAL) DAILY    losartan (COZAAR) 25 MG tablet Take 25 mg by mouth daily.    metoprolol succinate (TOPROL-XL) 25 MG 24 hr tablet TAKE 1 TABLET BY MOUTH EVERY DAY    nitroGLYCERIN (NITROSTAT) 0.4 MG SL tablet Place 1 tablet (0.4 mg total) under the tongue every 5 (five) minutes as needed for chest pain. Maximum 3 doses    omeprazole (PRILOSEC) 20 MG capsule TAKE 1 CAPSULE (20 MG TOTAL) BY MOUTH 2 (TWO) TIMES DAILY BEFORE A MEAL.    polyethylene glycol (MIRALAX / GLYCOLAX) packet Take 17 g by mouth daily.    Probiotic Product (PROBIOTIC PO) Take 1 tablet by  mouth daily.    sertraline (ZOLOFT) 25 MG tablet Take 25 mg by mouth daily.    sodium fluoride (PREVIDENT 5000 PLUS) 1.1 % CREA dental cream Take 1 application by mouth daily.     spironolactone (ALDACTONE) 25 MG tablet Take 1 tablet by mouth daily.    tizanidine (ZANAFLEX) 2 MG capsule Take 1 capsule (2 mg total) by mouth 3 (three) times daily. 04/27/2018: PRN   traZODone (DESYREL) 50 MG tablet TAKE 1 TABLET BY MOUTH EVERYDAY AT BEDTIME     triamcinolone cream (KENALOG) 0.1 % APPLY TO AFFECTED AREA TWICE A DAY    [DISCONTINUED] albuterol (VENTOLIN HFA) 108 (90 Base) MCG/ACT inhaler Inhale 2 puffs into the lungs every 6 (six) hours as needed for wheezing or shortness of breath. (Patient not taking: Reported on 07/08/2021)    No facility-administered encounter medications on file as of 08/27/2021.    Patient Active Problem List   Diagnosis Date Noted   Chronic diastolic heart failure ([place]) 06/19/2021   COVID-19 virus infection 04/20/2021   Generalized weakness 11/23/2020   Vaccine reaction, initial encounter 08/20/2020   Secondary and unspecified malignant neoplasm of axilla and upper limb lymph nodes ([place]) 01/16/2020   Other drug-induced neutropenia ([place]) 01/16/2020   Hemiplegia affecting dominant side, post-stroke ([place]) 01/16/2020   Does use hearing aid 07/14/2019   Hypothyroidism due to acquired atrophy of thyroid 04/08/2019   Impaired balance as late effect of cerebrovascular accident 11/13/2018   History of CVA with residual deficit 11/13/2018   Leukopenia 11/11/2018   Anxiety about health 07/05/2018   Mediastinal adenopathy 04/27/2018   Edema due to hypoalbuminemia 02/20/2018   Hospital discharge follow-up 02/20/2018   History of vertebral fracture 02/18/2018   GERD (gastroesophageal reflux disease) 10/31/2017   Hand joint pain 06/13/2017   Vaginal atrophy 05/11/2017   Venous insufficiency of both lower extremities 01/10/2017   Discoid lupus 06/26/2016   Anemia 05/24/2016   Recurrent UTI 04/29/2016   TMJ syndrome 03/31/2016   Hearing loss 03/12/2016   Osteopenia 12/05/2015   Malignant neoplasm of left female breast ([place]) 11/26/2015   Hx of thyroid cancer 06/12/2014   Adenomatous polyp 06/12/2014   Major depressive disorder, recurrent episode, moderate ([place]) 05/29/2014   Grief 04/08/2014   Sjogren's syndrome ([place]) 03/09/2014   Insomnia due to stress 03/06/2014   Hyperlipidemia 12/28/2012   Medicare annual  wellness visit, subsequent 12/28/2012   Essential hypertension 10/02/2011   Iatrogenic hypothyroidism 10/02/2011   Purpura ([place]) 05/25/2011    Conditions to be addressed/monitored:CHF and HTN  Care Plan : CCM RNCM Care [place]  Updates made by Leona Singleton, RN since 08/28/2021 12:00 AM     Problem: Knowledge deficiet related to management of CHF, HTN   Priority: Medium     Long-Range Goal: Patient will work with CM team to gain knowledge of self health care knowledge and management   Start Date: 06/30/2021  Expected End Date: 12/05/2021  Recent Progress: On track  Priority: Medium  Note:   Current Barriers:  Knowledge Deficits related to plan of care for management of CHF and HTN   reports weighing daily, weight 130.8 pounds.  Monitors blood pressures occasionally with systollic rangung 299-242'A (unsure of diastolic)  Denies any chest pain or shortness of breath or swelling.  Does report she is in the middle of Lupus flare.  Was placd back on weekly Methotrexate and on Prednisone taper; reports starting to feel better.  RNCM Clinical Goal(s):  Patient will verbalize understanding of plan  for management of CHF and HTN  verbalize basic understanding of CHF and HTN disease process and self health management plan as previous take all medications exactly as prescribed and will call provider for medication related questions through collaboration with RN Care manager, provider, and care team.   Interventions: 1:1 collaboration with primary care provider regarding development and update of comprehensive plan of care as evidenced by provider attestation and co-signature Inter-disciplinary care team collaboration (see longitudinal plan of care) Evaluation of current treatment plan related to  self management and patient's adherence to plan as established by provider   Heart Failure Interventions:  (Status: Goal on Track (progressing): YES.)   Long Term Goal Basic overview and discussion  of pathophysiology of Heart Failure reviewed; Provided education on low sodium diet; Reviewed Heart Failure Action Plan in depth and provided written copy; Assessed need for readable accurate scales in home; Provided education about placing scale on hard, flat surface; Advised patient to weigh each morning after emptying bladder; Discussed importance of daily weight and advised patient to weigh and record daily; Reviewed role of diuretics in prevention of fluid overload and management of heart failure; Discussed the importance of keeping all appointments with provider;  Hypertension: (Status: New goal. Goal on Track (progressing): YES.)   Long Term Goal Last practice recorded BP readings:  BP Readings from Last 3 Encounters:  06/18/21 128/66  05/20/21 124/72  05/13/21 (!) 112/57  Most recent eGFR/CrCl: No results found for: EGFR  No components found for: CRCL Evaluation of current treatment plan related to hypertension self management and patient's adherence to plan as established by provider;   Provided education to patient re: stroke prevention, s/s of heart attack and stroke; Reviewed prescribed diet low salt Reviewed medications with patient and discussed importance of compliance;  Counseled on adverse effects of illicit drug and excessive alcohol use in patients with high blood pressure;  Counseled on the importance of exercise goals with target of 150 minutes per week Discussed plans with patient for ongoing care management follow up and provided patient with direct contact information for care management team;   Patient Goals/Self-Care Activities: Patient will self administer medications as prescribed as evidenced by self report/primary caregiver report  Patient will attend all scheduled provider appointments as evidenced by clinician review of documented attendance to scheduled appointments and patient/caregiver report Call office if I gain more than 2 pounds in one day or 5  pounds in one week Keep legs up while sitting Use salt in moderation Watch for swelling in feet, ankles and legs every day Weigh myself daily writing in log Check blood pressures at least once a week keeping log for provider review         Plan:The care management team will reach out to the patient again over the next 45 days.  Hubert Azure RN, MSN RN Care Management Coordinator [place] [phone] [email or web address]

## 2021-08-29 ENCOUNTER — Ambulatory Visit: Payer: PPO | Admitting: Internal Medicine

## 2021-08-29 NOTE — Assessment & Plan Note (Signed)
Resolved by repeat CBC    Lab Results  Component Value Date   WBC 8.7 05/20/2021   HGB 11.1 (L) 05/20/2021   HCT 32.0 (L) 05/20/2021   MCV 100.7 (H) 05/20/2021   PLT 240.0 05/20/2021

## 2021-08-29 NOTE — Assessment & Plan Note (Signed)
Recommend alternating diuretics daily to avoid excessive diuresis.

## 2021-08-29 NOTE — Assessment & Plan Note (Addendum)
Improving gradually, but needs home PT , which was ordered today

## 2021-08-29 NOTE — Assessment & Plan Note (Signed)
She has developed weakness in the affected leg and wants to resume home PT

## 2021-09-02 ENCOUNTER — Telehealth: Payer: Self-pay | Admitting: Internal Medicine

## 2021-09-02 NOTE — Telephone Encounter (Signed)
Merleen Nicely from [facility] called in regards to needing an addemdum of pts diagnosis of CHS. Insurance is needing this in order to proceed. Merleen Nicely can be reached at [phone]

## 2021-09-03 DIAGNOSIS — Z01818 Encounter for other preprocedural examination: Secondary | ICD-10-CM | POA: Diagnosis not present

## 2021-09-03 DIAGNOSIS — J811 Chronic pulmonary edema: Secondary | ICD-10-CM | POA: Diagnosis not present

## 2021-09-03 NOTE — Telephone Encounter (Signed)
Spoke with Merleen Nicely and she stated that she found the information in the pt's chart yesterday afternoon and everything has been taken care of.

## 2021-09-04 DIAGNOSIS — L930 Discoid lupus erythematosus: Secondary | ICD-10-CM | POA: Diagnosis not present

## 2021-09-04 DIAGNOSIS — M546 Pain in thoracic spine: Secondary | ICD-10-CM | POA: Diagnosis not present

## 2021-09-04 DIAGNOSIS — Z853 Personal history of malignant neoplasm of breast: Secondary | ICD-10-CM | POA: Diagnosis not present

## 2021-09-04 DIAGNOSIS — Z85850 Personal history of malignant neoplasm of thyroid: Secondary | ICD-10-CM | POA: Diagnosis not present

## 2021-09-04 DIAGNOSIS — D649 Anemia, unspecified: Secondary | ICD-10-CM | POA: Diagnosis not present

## 2021-09-04 DIAGNOSIS — I110 Hypertensive heart disease with heart failure: Secondary | ICD-10-CM | POA: Diagnosis not present

## 2021-09-04 DIAGNOSIS — M8580 Other specified disorders of bone density and structure, unspecified site: Secondary | ICD-10-CM | POA: Diagnosis not present

## 2021-09-04 DIAGNOSIS — K219 Gastro-esophageal reflux disease without esophagitis: Secondary | ICD-10-CM | POA: Diagnosis not present

## 2021-09-04 DIAGNOSIS — E038 Other specified hypothyroidism: Secondary | ICD-10-CM | POA: Diagnosis not present

## 2021-09-04 DIAGNOSIS — M3500 Sicca syndrome, unspecified: Secondary | ICD-10-CM | POA: Diagnosis not present

## 2021-09-04 DIAGNOSIS — I872 Venous insufficiency (chronic) (peripheral): Secondary | ICD-10-CM | POA: Diagnosis not present

## 2021-09-04 DIAGNOSIS — I69351 Hemiplegia and hemiparesis following cerebral infarction affecting right dominant side: Secondary | ICD-10-CM | POA: Diagnosis not present

## 2021-09-04 DIAGNOSIS — Z9012 Acquired absence of left breast and nipple: Secondary | ICD-10-CM | POA: Diagnosis not present

## 2021-09-04 DIAGNOSIS — H9190 Unspecified hearing loss, unspecified ear: Secondary | ICD-10-CM | POA: Diagnosis not present

## 2021-09-04 DIAGNOSIS — F331 Major depressive disorder, recurrent, moderate: Secondary | ICD-10-CM | POA: Diagnosis not present

## 2021-09-04 DIAGNOSIS — Z8616 Personal history of COVID-19: Secondary | ICD-10-CM | POA: Diagnosis not present

## 2021-09-04 DIAGNOSIS — Z86010 Personal history of colonic polyps: Secondary | ICD-10-CM | POA: Diagnosis not present

## 2021-09-04 DIAGNOSIS — Z9181 History of falling: Secondary | ICD-10-CM | POA: Diagnosis not present

## 2021-09-04 DIAGNOSIS — I5042 Chronic combined systolic (congestive) and diastolic (congestive) heart failure: Secondary | ICD-10-CM | POA: Diagnosis not present

## 2021-09-04 DIAGNOSIS — Z7952 Long term (current) use of systemic steroids: Secondary | ICD-10-CM | POA: Diagnosis not present

## 2021-09-04 DIAGNOSIS — Z87440 Personal history of urinary (tract) infections: Secondary | ICD-10-CM | POA: Diagnosis not present

## 2021-09-04 DIAGNOSIS — E785 Hyperlipidemia, unspecified: Secondary | ICD-10-CM | POA: Diagnosis not present

## 2021-09-06 DIAGNOSIS — I5042 Chronic combined systolic (congestive) and diastolic (congestive) heart failure: Secondary | ICD-10-CM

## 2021-09-06 DIAGNOSIS — I10 Essential (primary) hypertension: Secondary | ICD-10-CM

## 2021-09-09 DIAGNOSIS — M3500 Sicca syndrome, unspecified: Secondary | ICD-10-CM | POA: Diagnosis not present

## 2021-09-09 DIAGNOSIS — I872 Venous insufficiency (chronic) (peripheral): Secondary | ICD-10-CM | POA: Diagnosis not present

## 2021-09-09 DIAGNOSIS — I69351 Hemiplegia and hemiparesis following cerebral infarction affecting right dominant side: Secondary | ICD-10-CM | POA: Diagnosis not present

## 2021-09-09 DIAGNOSIS — H9190 Unspecified hearing loss, unspecified ear: Secondary | ICD-10-CM | POA: Diagnosis not present

## 2021-09-09 DIAGNOSIS — Z853 Personal history of malignant neoplasm of breast: Secondary | ICD-10-CM | POA: Diagnosis not present

## 2021-09-09 DIAGNOSIS — Z86010 Personal history of colonic polyps: Secondary | ICD-10-CM | POA: Diagnosis not present

## 2021-09-09 DIAGNOSIS — M8580 Other specified disorders of bone density and structure, unspecified site: Secondary | ICD-10-CM | POA: Diagnosis not present

## 2021-09-09 DIAGNOSIS — Z7952 Long term (current) use of systemic steroids: Secondary | ICD-10-CM | POA: Diagnosis not present

## 2021-09-09 DIAGNOSIS — L930 Discoid lupus erythematosus: Secondary | ICD-10-CM | POA: Diagnosis not present

## 2021-09-09 DIAGNOSIS — Z85850 Personal history of malignant neoplasm of thyroid: Secondary | ICD-10-CM | POA: Diagnosis not present

## 2021-09-09 DIAGNOSIS — F331 Major depressive disorder, recurrent, moderate: Secondary | ICD-10-CM | POA: Diagnosis not present

## 2021-09-09 DIAGNOSIS — D649 Anemia, unspecified: Secondary | ICD-10-CM | POA: Diagnosis not present

## 2021-09-09 DIAGNOSIS — I69315 Cognitive social or emotional deficit following cerebral infarction: Secondary | ICD-10-CM | POA: Diagnosis not present

## 2021-09-09 DIAGNOSIS — I5042 Chronic combined systolic (congestive) and diastolic (congestive) heart failure: Secondary | ICD-10-CM | POA: Diagnosis not present

## 2021-09-09 DIAGNOSIS — M546 Pain in thoracic spine: Secondary | ICD-10-CM | POA: Diagnosis not present

## 2021-09-09 DIAGNOSIS — Z9012 Acquired absence of left breast and nipple: Secondary | ICD-10-CM | POA: Diagnosis not present

## 2021-09-09 DIAGNOSIS — Z8616 Personal history of COVID-19: Secondary | ICD-10-CM | POA: Diagnosis not present

## 2021-09-09 DIAGNOSIS — I110 Hypertensive heart disease with heart failure: Secondary | ICD-10-CM | POA: Diagnosis not present

## 2021-09-09 DIAGNOSIS — Z9181 History of falling: Secondary | ICD-10-CM | POA: Diagnosis not present

## 2021-09-09 DIAGNOSIS — E038 Other specified hypothyroidism: Secondary | ICD-10-CM | POA: Diagnosis not present

## 2021-09-09 DIAGNOSIS — K219 Gastro-esophageal reflux disease without esophagitis: Secondary | ICD-10-CM | POA: Diagnosis not present

## 2021-09-09 DIAGNOSIS — E785 Hyperlipidemia, unspecified: Secondary | ICD-10-CM | POA: Diagnosis not present

## 2021-09-09 DIAGNOSIS — Z87440 Personal history of urinary (tract) infections: Secondary | ICD-10-CM | POA: Diagnosis not present

## 2021-09-12 ENCOUNTER — Other Ambulatory Visit: Payer: Self-pay | Admitting: Internal Medicine

## 2021-09-21 ENCOUNTER — Encounter: Payer: Self-pay | Admitting: Internal Medicine

## 2021-09-24 ENCOUNTER — Other Ambulatory Visit (INDEPENDENT_AMBULATORY_CARE_PROVIDER_SITE_OTHER): Payer: PPO

## 2021-09-24 ENCOUNTER — Other Ambulatory Visit: Payer: Self-pay

## 2021-09-24 DIAGNOSIS — I5032 Chronic diastolic (congestive) heart failure: Secondary | ICD-10-CM | POA: Diagnosis not present

## 2021-09-24 DIAGNOSIS — D702 Other drug-induced agranulocytosis: Secondary | ICD-10-CM | POA: Diagnosis not present

## 2021-09-24 DIAGNOSIS — Z79899 Other long term (current) drug therapy: Secondary | ICD-10-CM

## 2021-09-24 LAB — CBC WITH DIFFERENTIAL/PLATELET
Basophils Absolute: 0.1 10*3/uL (ref 0.0–0.1)
Basophils Relative: 1.2 % (ref 0.0–3.0)
Eosinophils Absolute: 0.1 10*3/uL (ref 0.0–0.7)
Eosinophils Relative: 1 % (ref 0.0–5.0)
HCT: 33.5 % — ABNORMAL LOW (ref 36.0–46.0)
Hemoglobin: 11.5 g/dL — ABNORMAL LOW (ref 12.0–15.0)
Lymphocytes Relative: 16.9 % (ref 12.0–46.0)
Lymphs Abs: 1 10*3/uL (ref 0.7–4.0)
MCHC: 34.4 g/dL (ref 30.0–36.0)
MCV: 103 fl — ABNORMAL HIGH (ref 78.0–100.0)
Monocytes Absolute: 0.2 10*3/uL (ref 0.1–1.0)
Monocytes Relative: 3.5 % (ref 3.0–12.0)
Neutro Abs: 4.7 10*3/uL (ref 1.4–7.7)
Neutrophils Relative %: 77.4 % — ABNORMAL HIGH (ref 43.0–77.0)
Platelets: 183 10*3/uL (ref 150.0–400.0)
RBC: 3.25 Mil/uL — ABNORMAL LOW (ref 3.87–5.11)
RDW: 18.7 % — ABNORMAL HIGH (ref 11.5–15.5)
WBC: 6 10*3/uL (ref 4.0–10.5)

## 2021-09-24 LAB — COMPREHENSIVE METABOLIC PANEL
ALT: 7 U/L (ref 0–35)
AST: 16 U/L (ref 0–37)
Albumin: 3.9 g/dL (ref 3.5–5.2)
Alkaline Phosphatase: 72 U/L (ref 39–117)
BUN: 16 mg/dL (ref 6–23)
CO2: 33 mEq/L — ABNORMAL HIGH (ref 19–32)
Calcium: 8.9 mg/dL (ref 8.4–10.5)
Chloride: 96 mEq/L (ref 96–112)
Creatinine, Ser: 0.77 mg/dL (ref 0.40–1.20)
GFR: 70.69 mL/min (ref >60.00)
Glucose, Bld: 95 mg/dL (ref 70–99)
Potassium: 4.4 mEq/L (ref 3.5–5.1)
Sodium: 134 mEq/L — ABNORMAL LOW (ref 135–145)
Total Bilirubin: 0.8 mg/dL (ref 0.2–1.2)
Total Protein: 6 g/dL (ref 6.0–8.3)

## 2021-09-25 DIAGNOSIS — L932 Other local lupus erythematosus: Secondary | ICD-10-CM | POA: Diagnosis not present

## 2021-09-25 DIAGNOSIS — L931 Subacute cutaneous lupus erythematosus: Secondary | ICD-10-CM | POA: Diagnosis not present

## 2021-09-25 DIAGNOSIS — Z79899 Other long term (current) drug therapy: Secondary | ICD-10-CM | POA: Diagnosis not present

## 2021-09-30 ENCOUNTER — Ambulatory Visit (INDEPENDENT_AMBULATORY_CARE_PROVIDER_SITE_OTHER): Payer: PPO | Admitting: Pharmacist

## 2021-09-30 DIAGNOSIS — E782 Mixed hyperlipidemia: Secondary | ICD-10-CM

## 2021-09-30 DIAGNOSIS — I10 Essential (primary) hypertension: Secondary | ICD-10-CM

## 2021-09-30 DIAGNOSIS — I5032 Chronic diastolic (congestive) heart failure: Secondary | ICD-10-CM

## 2021-09-30 DIAGNOSIS — L930 Discoid lupus erythematosus: Secondary | ICD-10-CM

## 2021-09-30 NOTE — Chronic Care Management (AMB) (Signed)
Chronic Care Management CCM Pharmacy Note  09/30/2021 Name:  Kim Santiago MRN:  [id number] DOB:  1935-05-30  Summary: - Tolerating regimen well. Reports metoprolol succinate is now Tier 2  Recommendations/Changes made from today's visit: - Tier Exception completed for metoprolol succinate, will follow for outcome.   Subjective: Kim Santiago is an 87 y.o. year old female who is a primary patient of Tullo, Aris Everts, MD.  The CCM team was consulted for assistance with disease management and care coordination needs.    Engaged with patient by telephone for follow up visit for pharmacy case management and/or care coordination services.   Objective:  Medications Reviewed Today     Reviewed by De Hollingshead, RPH-CPP (Pharmacist) on 09/30/21 at 1040  Med List Status: <None>   Medication Order Taking? Sig Documenting Provider Last Dose Status Informant  atorvastatin (LIPITOR) 20 MG tablet [id number] Yes TAKE 1 TABLET BY MOUTH DAILY AT Sonny Masters, MD Taking Active   Calcium Carbonate-Vitamin D 600-200 MG-UNIT TABS [id number] Yes Take 2 tablets by mouth daily. [provider] Taking Active Multiple Informants  cholecalciferol (VITAMIN D) 1000 UNITS tablet [id number] Yes Take 2,000 Units by mouth daily. [provider] Taking Active   CRANBERRY PO [id number] Yes Take 1 capsule by mouth 2 (two) times daily. [provider] Taking Active Multiple Informants  folic acid (FOLVITE) 1 MG tablet [id number] Yes Take 2 mg by mouth daily. [provider] Taking Active   furosemide (LASIX) 20 MG tablet [id number] Yes 1 TABLET EVERY OTHER DAY AS NEEDED [name], MD Taking Active   levothyroxine (SYNTHROID) 75 MCG tablet [id number] Yes TAKE 1 TABLET BY MOUTH (75MCG TOTAL) DAILY [name], MD Taking Active Multiple Informants  losartan (COZAAR) 25 MG tablet [id number] No Take 25 mg by mouth daily.  Patient not taking: Reported on 09/30/2021    [provider] Not Taking Active   MELATONIN PO [id number] Yes Take 1 tablet by mouth every evening. [provider] Taking Active   methotrexate (RHEUMATREX) 2.5 MG tablet [id number] Yes Take 15 mg by mouth once a week. [provider] Taking Active   metoprolol succinate (TOPROL-XL) 25 MG 24 hr tablet [id number] Yes TAKE 1 TABLET BY MOUTH EVERY DAY [name], MD Taking Active   nitroGLYCERIN (NITROSTAT) 0.4 MG SL tablet [id number]  Place 1 tablet (0.4 mg total) under the tongue every 5 (five) minutes as needed for chest pain. Maximum 3 doses [name], MD  Active Multiple Informants  omeprazole (PRILOSEC) 20 MG capsule [id number] Yes TAKE 1 CAPSULE (20 MG TOTAL) BY MOUTH 2 (TWO) TIMES DAILY BEFORE A MEAL. [name], MD Taking Active   polyethylene glycol [facility] / GLYCOLAX) packet [id number]  Take 17 g by mouth daily. [provider]  Active Multiple Informants  predniSONE (DELTASONE) 5 MG tablet [id number] Yes Take 1 tablet by mouth daily. [provider] Taking Active   Probiotic Product (PROBIOTIC PO) [id number] Yes Take 1 tablet by mouth daily. [provider] Taking Active Multiple Informants  sertraline (ZOLOFT) 25 MG tablet [id number] Yes Take 25 mg by mouth daily. [provider] Taking Active Multiple Informants  sodium fluoride (PREVIDENT 5000 PLUS) 1.1 % CREA dental cream [id number] Yes Take 1 application by mouth daily.  [provider] Taking Active Multiple Informants  spironolactone (ALDACTONE) 25 MG tablet [id number] Yes Take 1 tablet by mouth every other day. [provider] Taking Active   tizanidine (ZANAFLEX)  2 MG capsule [id number] No Take 1 capsule (2 mg total) by mouth 3 (three) times daily.  Patient not taking: Reported on 09/30/2021   [name], MD Not Taking Active Multiple Informants           Med Note Renard Hamper, Seleta Rhymes   Wed Apr 27, 2018  3:31 PM) PRN  triamcinolone cream  (KENALOG) 0.1 % [id number] Yes APPLY TO AFFECTED AREA TWICE A DAY [provider] Taking Active Multiple Informants            Pertinent Labs:   Lab Results  Component Value Date   HGBA1C 4.9 11/08/2018   Lab Results  Component Value Date   CHOL 92 07/23/2020   HDL 31.40 (L) 07/23/2020   LDLCALC 39 07/23/2020   LDLDIRECT 117.6 12/27/2012   TRIG 110.0 07/23/2020   CHOLHDL 3 07/23/2020   Lab Results  Component Value Date   CREATININE 0.77 09/24/2021   BUN 16 09/24/2021   NA 134 (L) 09/24/2021   K 4.4 09/24/2021   CL 96 09/24/2021   CO2 33 (H) 09/24/2021    SDOH:  (Social Determinants of Health) assessments and interventions performed:  SDOH Interventions    Flowsheet Row Most Recent Value  SDOH Interventions   Financial Strain Interventions Intervention Not Indicated       CCM Care Plan  Review of patient past medical history, allergies, medications, health status, including review of consultants reports, laboratory and other test data, was performed as part of comprehensive evaluation and provision of chronic care management services.   Care Plan : Medication Management  Updates made by De Hollingshead, RPH-CPP since 09/30/2021 12:00 AM     Problem: HFpEF, hx MI, CKD, Lupus      Long-Range Goal: Disease Progression Prevention   Start Date: 07/08/2021  Recent Progress: On track  Priority: High  Note:   Current Barriers:  Complex patient with multiple comorbidities at high risk for hospitalization  Pharmacist Clinical Goal(s):  patient will achieve adherence to monitoring guidelines and medication adherence to achieve therapeutic efficacy through collaboration with PharmD and provider.   Interventions: 1:1 collaboration with [name], MD regarding development and update of comprehensive plan of care as evidenced by provider attestation and co-signature Inter-disciplinary care team collaboration (see longitudinal plan of  care) Comprehensive medication review performed; medication list updated in electronic medical record  Health Maintenance   Yearly influenza vaccination: up to date Td/Tdap vaccination: up to date Pneumonia vaccination: up to date COVID vaccinations: due for bivalent booster, plan to discuss w/ dermatology given future plans for immune modifying therapy  Shingrix vaccinations: due - discuss moving forward in light of future treatment plans for immune modifying therapy Colonoscopy: up to date Bone density scan: up to date Mammogram: up to date  Heart Failure, preserved:  Appropriately managed; current treatment:  ARNI/ACEi/ARB: d/c at last hospitalization for hypotension Beta blocker: metoprolol succinate 25 mg daily SGLT2: none Mineralocorticoid Receptor Antagonist: spironolactone 25 mg daily - alternating with furosemide Diuretic: furosemide 20 mg PRN Home BP readings: 120-140s/70s, though reports she is using wrist cuff. Declines to use arm cuff given issues with purpura and easy brusing.  Reports metoprolol succinate is now Tier 2. Reviewed insurance formulary. Metoprolol tartrate is Tier 1, but would give preference to guideline directed beta blockers in HF. Carvedilol is the only Tier 1 GDMT beta blocker, and would prefer switching to agent with alpha blocking activity given history with hypotension. Will attempt Tier Exception for metoprolol  succinate. If Tier Exception denied, recommend switch to carvedilol with close monitoring.  Recommended to continue current regimen at this time. Follow for need/ability to add back ACEi/ARB therapy and/or initiation of SGLT2 for dual CKD/CV benefit.   Lupus: Improving per patient report; current regimen: methotrexate 15 mg weekly, folic acid 2 mg daily, prednisone 5 mg daily - still overlapping at this time while methotrexate is taking effect;triamcinolone 1% PRN Recommended to continue current regimen as above Vaccines as above based on  treatment plan.   Depression/Anxiety/Insomnia:   Well managed; current treatment: sertraline 25 mg daily; reports she started melatonin QPM and this is helping sleep Recommended to continue current regimen at this time  Hypothyroidism (s/p thyroidectomy d/t cancer): Controlled; current regimen: levothyroxine 75 mcg daily Recommended to continue current regimen at this time  ASCVD Risk Reduction/Hyperlipidemia: Controlled, current regimen: atorvastatin 20 mg daily  Antiplatelet regimen: unclear benefit given age Recommended to continue current regimen at this time  Supplements: Vitamin D 2000 units daily, calcium 1200 mg daily, cranberry for UTI prevention  Patient Goals/Self-Care Activities patient will:  - take medications as prescribed as evidenced by patient report and record review focus on medication adherence by using pill box check blood pressure periodically, document, and provide at future appointments       Plan: Telephone follow up appointment with care management team member scheduled for:  pending insurance determination on Tier Exception  [name], PharmD, [place], [place] Pharmacist [employer] at [employer] [phone]

## 2021-09-30 NOTE — Patient Instructions (Signed)
Visit Information  Following are the goals we discussed today:    Patient Goals/Self-Care Activities patient will:  - take medications as prescribed as evidenced by patient report and record review focus on medication adherence by using pill box check blood pressure periodically, document, and provide at future appointments          Plan: Telephone follow up appointment with care management team member scheduled for:  pending insurance determination on Tier Exception   [name], PharmD, [place], CPP Clinical Pharmacist [place] at [facility] [phone]     Please call the care guide team at [phone] if you need to cancel or reschedule your appointment.   Patient verbalizes understanding of instructions and care plan provided today and agrees to view in [place]. Active MyChart status confirmed with patient.

## 2021-10-07 DIAGNOSIS — I5032 Chronic diastolic (congestive) heart failure: Secondary | ICD-10-CM | POA: Diagnosis not present

## 2021-10-07 DIAGNOSIS — I10 Essential (primary) hypertension: Secondary | ICD-10-CM

## 2021-10-07 DIAGNOSIS — E782 Mixed hyperlipidemia: Secondary | ICD-10-CM

## 2021-10-08 ENCOUNTER — Ambulatory Visit (INDEPENDENT_AMBULATORY_CARE_PROVIDER_SITE_OTHER): Payer: PPO | Admitting: *Deleted

## 2021-10-08 DIAGNOSIS — E785 Hyperlipidemia, unspecified: Secondary | ICD-10-CM | POA: Diagnosis not present

## 2021-10-08 DIAGNOSIS — I10 Essential (primary) hypertension: Secondary | ICD-10-CM

## 2021-10-08 DIAGNOSIS — I5042 Chronic combined systolic (congestive) and diastolic (congestive) heart failure: Secondary | ICD-10-CM | POA: Diagnosis not present

## 2021-10-08 DIAGNOSIS — H9190 Unspecified hearing loss, unspecified ear: Secondary | ICD-10-CM | POA: Diagnosis not present

## 2021-10-08 DIAGNOSIS — D649 Anemia, unspecified: Secondary | ICD-10-CM | POA: Diagnosis not present

## 2021-10-08 DIAGNOSIS — I5032 Chronic diastolic (congestive) heart failure: Secondary | ICD-10-CM

## 2021-10-08 DIAGNOSIS — I872 Venous insufficiency (chronic) (peripheral): Secondary | ICD-10-CM | POA: Diagnosis not present

## 2021-10-08 DIAGNOSIS — M8580 Other specified disorders of bone density and structure, unspecified site: Secondary | ICD-10-CM | POA: Diagnosis not present

## 2021-10-08 DIAGNOSIS — M546 Pain in thoracic spine: Secondary | ICD-10-CM | POA: Diagnosis not present

## 2021-10-08 DIAGNOSIS — Z9181 History of falling: Secondary | ICD-10-CM | POA: Diagnosis not present

## 2021-10-08 DIAGNOSIS — I110 Hypertensive heart disease with heart failure: Secondary | ICD-10-CM | POA: Diagnosis not present

## 2021-10-08 DIAGNOSIS — Z9012 Acquired absence of left breast and nipple: Secondary | ICD-10-CM | POA: Diagnosis not present

## 2021-10-08 DIAGNOSIS — F331 Major depressive disorder, recurrent, moderate: Secondary | ICD-10-CM | POA: Diagnosis not present

## 2021-10-08 DIAGNOSIS — E038 Other specified hypothyroidism: Secondary | ICD-10-CM | POA: Diagnosis not present

## 2021-10-08 DIAGNOSIS — Z853 Personal history of malignant neoplasm of breast: Secondary | ICD-10-CM | POA: Diagnosis not present

## 2021-10-08 DIAGNOSIS — Z86010 Personal history of colonic polyps: Secondary | ICD-10-CM | POA: Diagnosis not present

## 2021-10-08 DIAGNOSIS — I69351 Hemiplegia and hemiparesis following cerebral infarction affecting right dominant side: Secondary | ICD-10-CM | POA: Diagnosis not present

## 2021-10-08 DIAGNOSIS — Z7952 Long term (current) use of systemic steroids: Secondary | ICD-10-CM | POA: Diagnosis not present

## 2021-10-08 DIAGNOSIS — Z85850 Personal history of malignant neoplasm of thyroid: Secondary | ICD-10-CM | POA: Diagnosis not present

## 2021-10-08 DIAGNOSIS — M3500 Sicca syndrome, unspecified: Secondary | ICD-10-CM | POA: Diagnosis not present

## 2021-10-08 DIAGNOSIS — L930 Discoid lupus erythematosus: Secondary | ICD-10-CM | POA: Diagnosis not present

## 2021-10-08 DIAGNOSIS — K219 Gastro-esophageal reflux disease without esophagitis: Secondary | ICD-10-CM | POA: Diagnosis not present

## 2021-10-08 DIAGNOSIS — Z87440 Personal history of urinary (tract) infections: Secondary | ICD-10-CM | POA: Diagnosis not present

## 2021-10-08 DIAGNOSIS — Z8616 Personal history of COVID-19: Secondary | ICD-10-CM | POA: Diagnosis not present

## 2021-10-08 NOTE — Chronic Care Management (AMB) (Signed)
Chronic Care Management   CCM RN Visit Note  10/08/2021 Name: Kim Santiago MRN: [id number] DOB: May 19, 1935  Subjective: Kim Santiago is a 87 y.o. year old female who is a primary care patient of [name], MD. The care management team was consulted for assistance with disease management and care coordination needs.    Engaged with patient by telephone for follow up visit in response to provider referral for case management and/or care coordination services.   Consent to Services:  The patient was given information about Chronic Care Management services, agreed to services, and gave verbal consent prior to initiation of services.  Please see initial visit note for detailed documentation.   Patient agreed to services and verbal consent obtained.   Assessment: Review of patient past medical history, allergies, medications, health status, including review of consultants reports, laboratory and other test data, was performed as part of comprehensive evaluation and provision of chronic care management services.   SDOH (Social Determinants of Health) assessments and interventions performed:    CCM Care Plan  Allergies  Allergen Reactions   Azathioprine Nausea And Vomiting    Severe vomiting   Hydroxychloroquine Hives and Nausea And Vomiting   Mycophenolate Mofetil Nausea Only   Amoxicillin Rash    Has patient had a PCN reaction causing immediate rash, facial/tongue/throat swelling, SOB or lightheadedness with hypotension: Unknown Has patient had a PCN reaction causing severe rash involving mucus membranes or skin necrosis: Unknown Has patient had a PCN reaction that required hospitalization: Unknown Has patient had a PCN reaction occurring within the last 10 years: Unknown If all of the above answers are "NO", then may proceed with Cephalosporin use. Tolerates ceftriaxone and Ancef   Codeine Nausea And Vomiting   Naprosyn [Naproxen] Swelling   Orudis [Ketoprofen] Hives    Sulfa Antibiotics Rash   Sulfathiazole Rash    Outpatient Encounter Medications as of 10/08/2021  Medication Sig Note   atorvastatin (LIPITOR) 20 MG tablet TAKE 1 TABLET BY MOUTH DAILY AT 6PM    Calcium Carbonate-Vitamin D 600-200 MG-UNIT TABS Take 2 tablets by mouth daily.    cholecalciferol (VITAMIN D) 1000 UNITS tablet Take 2,000 Units by mouth daily.    CRANBERRY PO Take 1 capsule by mouth 2 (two) times daily.    folic acid (FOLVITE) 1 MG tablet Take 2 mg by mouth daily.    furosemide (LASIX) 20 MG tablet 1 TABLET EVERY OTHER DAY AS NEEDED    levothyroxine (SYNTHROID) 75 MCG tablet TAKE 1 TABLET BY MOUTH (75MCG TOTAL) DAILY    losartan (COZAAR) 25 MG tablet Take 25 mg by mouth daily. (Patient not taking: Reported on 09/30/2021)    MELATONIN PO Take 1 tablet by mouth every evening.    methotrexate (RHEUMATREX) 2.5 MG tablet Take 15 mg by mouth once a week.    metoprolol succinate (TOPROL-XL) 25 MG 24 hr tablet TAKE 1 TABLET BY MOUTH EVERY DAY    nitroGLYCERIN (NITROSTAT) 0.4 MG SL tablet Place 1 tablet (0.4 mg total) under the tongue every 5 (five) minutes as needed for chest pain. Maximum 3 doses    omeprazole (PRILOSEC) 20 MG capsule TAKE 1 CAPSULE (20 MG TOTAL) BY MOUTH 2 (TWO) TIMES DAILY BEFORE A MEAL.    polyethylene glycol (MIRALAX / GLYCOLAX) packet Take 17 g by mouth daily.    predniSONE (DELTASONE) 5 MG tablet Take 1 tablet by mouth daily.    Probiotic Product (PROBIOTIC PO) Take 1 tablet by mouth daily.  sertraline (ZOLOFT) 25 MG tablet Take 25 mg by mouth daily.    sodium fluoride (PREVIDENT 5000 PLUS) 1.1 % CREA dental cream Take 1 application by mouth daily.     spironolactone (ALDACTONE) 25 MG tablet Take 1 tablet by mouth every other day.    tizanidine (ZANAFLEX) 2 MG capsule Take 1 capsule (2 mg total) by mouth 3 (three) times daily. (Patient not taking: Reported on 09/30/2021) 04/27/2018: PRN   triamcinolone cream (KENALOG) 0.1 % APPLY TO AFFECTED AREA TWICE A DAY    No  facility-administered encounter medications on file as of 10/08/2021.    Patient Active Problem List   Diagnosis Date Noted   Chronic diastolic heart failure ([place]) 06/19/2021   COVID-19 virus infection 04/20/2021   Generalized weakness 11/23/2020   Vaccine reaction, initial encounter 08/20/2020   Other drug-induced neutropenia ([place]) 01/16/2020   Hemiplegia affecting dominant side, post-stroke ([place]) 01/16/2020   Does use hearing aid 07/14/2019   Hypothyroidism due to acquired atrophy of thyroid 04/08/2019   Impaired balance as late effect of cerebrovascular accident 11/13/2018   History of CVA with residual deficit 11/13/2018   Leukopenia 11/11/2018   Anxiety about health 07/05/2018   Mediastinal adenopathy 04/27/2018   Edema due to hypoalbuminemia 02/20/2018   Hospital discharge follow-up 02/20/2018   History of vertebral fracture 02/18/2018   GERD (gastroesophageal reflux disease) 10/31/2017   Hand joint pain 06/13/2017   Vaginal atrophy 05/11/2017   Venous insufficiency of both lower extremities 01/10/2017   Discoid lupus 06/26/2016   Anemia 05/24/2016   Recurrent UTI 04/29/2016   TMJ syndrome 03/31/2016   Hearing loss 03/12/2016   Osteopenia 12/05/2015   Malignant neoplasm of left female breast ([place]) 11/26/2015   Hx of thyroid cancer 06/12/2014   Adenomatous polyp 06/12/2014   Major depressive disorder, recurrent episode, moderate ([place]) 05/29/2014   Grief 04/08/2014   Sjogren's syndrome ([place]) 03/09/2014   Insomnia due to stress 03/06/2014   Hyperlipidemia 12/28/2012   Medicare annual wellness visit, subsequent 12/28/2012   Essential hypertension 10/02/2011   Iatrogenic hypothyroidism 10/02/2011   Purpura ([place]) 05/25/2011    Conditions to be addressed/monitored:CHF and HTN  Care Plan : CCM RNCM [place]  Updates made by Leona Singleton, RN since 10/08/2021 12:00 AM     Problem: Knowledge deficiet related to management of CHF, HTN   Priority: Medium      Long-Range Goal: Patient will work with CM team to gain knowledge of self health care knowledge and management   Start Date: 06/30/2021  Expected End Date: 12/05/2021  Recent Progress: On track  Priority: Medium  Note:   Current Barriers:  Knowledge Deficits related to plan of care for management of CHF and HTN   reports weighing daily, weight 134 pounds (increased from post hospital weight gain, doesn't think it is fluid).  Monitors blood pressures, last 126/80 (which is what it has been ranging).  Denies any chest pain or shortness of breath.  Some swelling during the day that resolves at night.  Does report continued Lupus flare and now taking weekly prednisone.  Is working with [place], denies any recent falls  RNCM Clinical Goal(s):  Patient will verbalize understanding of plan for management of CHF and HTN  verbalize basic understanding of CHF and HTN disease process and self health management plan as previous take all medications exactly as prescribed and will call provider for medication related questions through collaboration with RN Care manager, provider, and care team.   Interventions: 1:1 collaboration  with primary care provider regarding development and update of comprehensive plan of care as evidenced by provider attestation and co-signature Inter-disciplinary care team collaboration (see longitudinal plan of care) Evaluation of current treatment plan related to  self management and patient's adherence to plan as established by provider  Heart Failure Interventions:  (Status: Goal on Track (progressing): YES.)   Long Term Goal Basic overview and discussion of pathophysiology of Heart Failure reviewed Provided education on low sodium diet Reviewed Heart Failure Action Plan in depth and provided written copy Assessed need for readable accurate scales in home Provided education about placing scale on hard, flat surface Advised patient to weigh each morning after emptying  bladder Discussed importance of daily weight and advised patient to weigh and record daily Reviewed role of diuretics in prevention of fluid overload and management of heart failure Discussed the importance of keeping all appointments with provider  Hypertension: (Status: New goal. Goal on Track (progressing): YES.)   Long Term Goal Last practice recorded BP readings:  BP Readings from Last 3 Encounters:  08/27/21 126/62  06/18/21 128/66  05/20/21 124/72  Most recent eGFR/CrCl: No results found for: EGFR  No components found for: CRCL Evaluation of current treatment plan related to hypertension self management and patient's adherence to plan as established by provider;   Provided education to patient re: stroke prevention, s/s of heart attack and stroke; Reviewed prescribed diet low salt Reviewed medications with patient and discussed importance of compliance;  Counseled on adverse effects of illicit drug and excessive alcohol use in patients with high blood pressure;  Counseled on the importance of exercise goals with target of 150 minutes per week Discussed plans with patient for ongoing care management follow up and provided patient with direct contact information for care management team; Advised patient, providing education and rationale, to monitor blood pressure daily and record, calling PCP for findings outside established parameters;    Patient Goals/Self-Care Activities: Patient will self administer medications as prescribed as evidenced by self report/primary caregiver report  Patient will attend all scheduled provider appointments as evidenced by clinician review of documented attendance to scheduled appointments and patient/caregiver report Call office if I gain more than 2 pounds in one day or 5 pounds in one week Keep legs up while sitting Use salt in moderation Watch for swelling in feet, ankles and legs every day Weigh myself daily writing in log Check blood pressures  at least once a week keeping log for provider review Continue with home health therapy Fall precautions and preventions       Plan:The care management team will reach out to the patient again over the next 45 days.  Hubert Azure RN, MSN RN Care Management Coordinator [place] [phone] [email or web address]

## 2021-10-08 NOTE — Patient Instructions (Addendum)
Visit Information  Thank you for taking time to visit with me today. Please don't hesitate to contact me if I can be of assistance to you before our next scheduled telephone appointment.  Following are the goals we discussed today:  Patient will self administer medications as prescribed as evidenced by self report/primary caregiver report  Patient will attend all scheduled provider appointments as evidenced by clinician review of documented attendance to scheduled appointments and patient/caregiver report Call office if I gain more than 2 pounds in one day or 5 pounds in one week Keep legs up while sitting Use salt in moderation Watch for swelling in feet, ankles and legs every day Weigh myself daily writing in log Check blood pressures at least once a week keeping log for provider review Continue with home health therapy Fall precautions and preventions  Our next appointment is by telephone on 3/15 at 1000  Please call the care guide team at [phone] if you need to cancel or reschedule your appointment.   If you are experiencing a Mental Health or [place] or need someone to talk to, please call the Suicide and Crisis Lifeline: 988 call the [place] National Suicide Prevention Lifeline: 3017294142 or TTY: 640-056-1837 TTY 530-185-8672) to talk to a trained counselor call 1-800-273-TALK (toll free, 24 hour hotline) call 911   Patient verbalizes understanding of instructions and care plan provided today and agrees to view in [place]. Active MyChart status confirmed with patient.    Hubert Azure RN, MSN RN Care Management Coordinator [place] [phone] [email or web address]

## 2021-10-21 ENCOUNTER — Encounter: Payer: Self-pay | Admitting: Internal Medicine

## 2021-10-21 ENCOUNTER — Other Ambulatory Visit: Payer: Self-pay

## 2021-10-21 ENCOUNTER — Ambulatory Visit (INDEPENDENT_AMBULATORY_CARE_PROVIDER_SITE_OTHER): Payer: PPO | Admitting: Internal Medicine

## 2021-10-21 VITALS — BP 142/68 | HR 99 | Temp 97.8°F | Ht 62.0 in | Wt 140.0 lb

## 2021-10-21 DIAGNOSIS — Z170 Estrogen receptor positive status [ER+]: Secondary | ICD-10-CM

## 2021-10-21 DIAGNOSIS — I5032 Chronic diastolic (congestive) heart failure: Secondary | ICD-10-CM | POA: Diagnosis not present

## 2021-10-21 DIAGNOSIS — Z79899 Other long term (current) drug therapy: Secondary | ICD-10-CM

## 2021-10-21 DIAGNOSIS — C50912 Malignant neoplasm of unspecified site of left female breast: Secondary | ICD-10-CM

## 2021-10-21 DIAGNOSIS — D649 Anemia, unspecified: Secondary | ICD-10-CM

## 2021-10-21 DIAGNOSIS — L930 Discoid lupus erythematosus: Secondary | ICD-10-CM

## 2021-10-21 NOTE — Assessment & Plan Note (Signed)
Currently taking prednisone and MTX without disease in remission  

## 2021-10-21 NOTE — Patient Instructions (Addendum)
You do not need a daily fluid pill .   Only resume furosemide alternating with spironolactone  if you develop swelling in legs   Your anemia is very mild,  and like due to your chronic inflammatory disease. I am Checking  iron and folic acid levels today   to cover all potential deficiencies   I would not decide whether to stop the zoloft (sertraline ) until springtime.  The lipitor is for your cholesterol.  It has been shown to prevent heart attacks,  so stay on it.    As long as you are taking methotrexate,  you will need labs every 6 to 8 weeks

## 2021-10-21 NOTE — Assessment & Plan Note (Signed)
She has not had to use diuretics lately .  She is reminded to alternate diuretics when used daily to avoid excessive diuresis.

## 2021-10-21 NOTE — Assessment & Plan Note (Signed)
Mild, likely due to chronic disease.  But will check folate and iron

## 2021-10-21 NOTE — Assessment & Plan Note (Signed)
She has decided to stop screening mammograms because she would not want treatment

## 2021-10-21 NOTE — Progress Notes (Signed)
Subjective:  Patient ID: Kim Santiago, female    DOB: 09-05-1935  Age: 87 y.o. MRN: [id number]  CC: The primary encounter diagnosis was Anemia, unspecified type. Diagnoses of Long-term use of high-risk medication, Malignant neoplasm of left breast in female, estrogen receptor positive, unspecified site of breast ([place]), Chronic diastolic heart failure ([place]), and Discoid lupus were also pertinent to this visit.   This visit occurred during the SARS-CoV-2 public health emergency.  Safety protocols were in place, including screening questions prior to the visit, additional usage of staff PPE, and extensive cleaning of exam room while observing appropriate contact time as indicated for disinfecting solutions.    HPI [name] presents for  Chief Complaint  Patient presents with   Follow-up    Follow up on anemia    87 yr old female with  breast cancer diagnosed in 2017, treated with simple mastectomy and Femara.  discoid lupus diagnosed in 2018 , uncontrolled despite multiple drug trials , currently  taking methotrexate/folic acid and prednisone, and Sjogren's Syndrome  presents for evaluation of anemia .    Labs reviewed.  She has had a chronic anemia that has improved over the last year.  B12 level is normal .  She denies fatigue ,  hematochezia and unintentional weight loss   Lower extremity edema:  she has not had a recurrence in several weeks and is no longer taking a daily diuretic .    Discoid lupus:  Dr Will Bonnet has not been able to put her disease in remission with MTX and has prescribed prednisone . Her first tapering regimen did not control her disease and the dose is going to be continued.    Outpatient Medications Prior to Visit  Medication Sig Dispense Refill   atorvastatin (LIPITOR) 20 MG tablet TAKE 1 TABLET BY MOUTH DAILY AT 6PM 90 tablet 1   Calcium Carbonate-Vitamin D 600-200 MG-UNIT TABS Take 2 tablets by mouth daily.     cholecalciferol (VITAMIN D) 1000 UNITS  tablet Take 2,000 Units by mouth daily.     CRANBERRY PO Take 1 capsule by mouth 2 (two) times daily.     folic acid (FOLVITE) 1 MG tablet Take 2 mg by mouth daily.     furosemide (LASIX) 20 MG tablet 1 TABLET EVERY OTHER DAY AS NEEDED 45 tablet 2   levothyroxine (SYNTHROID) 75 MCG tablet TAKE 1 TABLET BY MOUTH (75MCG TOTAL) DAILY 90 tablet 2   losartan (COZAAR) 25 MG tablet Take 25 mg by mouth daily.     MELATONIN PO Take 1 tablet by mouth every evening.     methotrexate (RHEUMATREX) 2.5 MG tablet Take 15 mg by mouth once a week.     metoprolol succinate (TOPROL-XL) 25 MG 24 hr tablet TAKE 1 TABLET BY MOUTH EVERY DAY 90 tablet 3   nitroGLYCERIN (NITROSTAT) 0.4 MG SL tablet Place 1 tablet (0.4 mg total) under the tongue every 5 (five) minutes as needed for chest pain. Maximum 3 doses 50 tablet 3   omeprazole (PRILOSEC) 20 MG capsule TAKE 1 CAPSULE (20 MG TOTAL) BY MOUTH 2 (TWO) TIMES DAILY BEFORE A MEAL. 180 capsule 1   polyethylene glycol (MIRALAX / GLYCOLAX) packet Take 17 g by mouth daily.     predniSONE (DELTASONE) 5 MG tablet Take 1 tablet by mouth daily.     Probiotic Product (PROBIOTIC PO) Take 1 tablet by mouth daily.     sertraline (ZOLOFT) 25 MG tablet Take 25 mg by mouth daily.  sodium fluoride (PREVIDENT 5000 PLUS) 1.1 % CREA dental cream Take 1 application by mouth daily.      spironolactone (ALDACTONE) 25 MG tablet Take 1 tablet by mouth every other day.     tizanidine (ZANAFLEX) 2 MG capsule Take 1 capsule (2 mg total) by mouth 3 (three) times daily. 90 capsule 0   traZODone (DESYREL) 50 MG tablet Take 50 mg by mouth at bedtime.     triamcinolone cream (KENALOG) 0.1 % APPLY TO AFFECTED AREA TWICE A DAY     No facility-administered medications prior to visit.    Review of Systems;  Patient denies headache, fevers, malaise, unintentional weight loss, skin rash, eye pain, sinus congestion and sinus pain, sore throat, dysphagia,  hemoptysis , cough, dyspnea, wheezing, chest  pain, palpitations, orthopnea, edema, abdominal pain, nausea, melena, diarrhea, constipation, flank pain, dysuria, hematuria, urinary  Frequency, nocturia, numbness, tingling, seizures,  Focal weakness, Loss of consciousness,  Tremor, insomnia, depression, anxiety, and suicidal ideation.      Objective:  BP (!) 142/68 (BP Location: Right Arm, Patient Position: Sitting, Cuff Size: Normal)    Pulse 99    Temp 97.8 F (36.6 C) (Oral)    Ht 5\' 2"  (1.575 m)    Wt 140 lb (63.5 kg)    SpO2 99%    BMI 25.61 kg/m   BP Readings from Last 3 Encounters:  10/21/21 (!) 142/68  08/27/21 126/62  06/18/21 128/66    Wt Readings from Last 3 Encounters:  10/21/21 140 lb (63.5 kg)  08/27/21 136 lb 12.8 oz (62.1 kg)  06/18/21 135 lb 3.2 oz (61.3 kg)    General appearance: alert, cooperative and appears stated age Ears: normal TM's and external ear canals both ears Throat: lips, mucosa, and tongue normal; teeth and gums normal Neck: no adenopathy, no carotid bruit, supple, symmetrical, trachea midline and thyroid not enlarged, symmetric, no tenderness/mass/nodules Back: symmetric, no curvature. ROM normal. No CVA tenderness. Lungs: clear to auscultation bilaterally Heart: regular rate and rhythm, S1, S2 normal, no murmur, click, rub or gallop Abdomen: soft, non-tender; bowel sounds normal; no masses,  no organomegaly Pulses: 2+ and symmetric Skin: diffuse erythematous rash covering chest and arms   Lymph nodes: Cervical, supraclavicular, and axillary nodes normal.  Lab Results  Component Value Date   HGBA1C 4.9 11/08/2018    Lab Results  Component Value Date   CREATININE 0.77 09/24/2021   CREATININE 0.73 06/18/2021   CREATININE 0.82 05/20/2021    Lab Results  Component Value Date   WBC 6.0 09/24/2021   HGB 11.5 (L) 09/24/2021   HCT 33.5 (L) 09/24/2021   PLT 183.0 09/24/2021   GLUCOSE 95 09/24/2021   CHOL 92 07/23/2020   TRIG 110.0 07/23/2020   HDL 31.40 (L) 07/23/2020   LDLDIRECT  117.6 12/27/2012   LDLCALC 39 07/23/2020   ALT 7 09/24/2021   AST 16 09/24/2021   NA 134 (L) 09/24/2021   K 4.4 09/24/2021   CL 96 09/24/2021   CREATININE 0.77 09/24/2021   BUN 16 09/24/2021   CO2 33 (H) 09/24/2021   TSH 1.630 05/09/2021   INR 1.0 10/10/2019   HGBA1C 4.9 11/08/2018     Assessment & Plan:   Problem List Items Addressed This Visit     Anemia - Primary    Mild, likely due to chronic disease.  But will check folate and iron       Relevant Orders   IBC + Ferritin   RBC Folate   CBC  with Differential/Platelet   Chronic diastolic heart failure ([place])    She has not had to use diuretics lately .  She is reminded to alternate diuretics when used daily to avoid excessive diuresis.      Discoid lupus    Currently taking prednisone and MTX without disease in remission       Malignant neoplasm of left female breast [facility])    She has decided to stop screening mammograms because she would not want treatment       Other Visit Diagnoses     Long-term use of high-risk medication       Relevant Orders   Comprehensive metabolic panel   CBC with Differential/Platelet   CBC with Differential/Platelet   Comprehensive metabolic panel       I spent 30 minutes dedicated to the care of this patient on the date of this encounter to include pre-visit review of patient's medical history,  most recent imaging studies, Face-to-face time with the patient , and post visit ordering of testing and therapeutics.    Follow-up: Return in about 6 months (around 04/20/2022).   [name], MD

## 2021-10-22 ENCOUNTER — Other Ambulatory Visit: Payer: Self-pay | Admitting: Internal Medicine

## 2021-10-22 LAB — CBC WITH DIFFERENTIAL/PLATELET
Basophils Absolute: 0 10*3/uL (ref 0.0–0.1)
Basophils Relative: 1.2 % (ref 0.0–3.0)
Eosinophils Absolute: 0.1 10*3/uL (ref 0.0–0.7)
Eosinophils Relative: 1.9 % (ref 0.0–5.0)
HCT: 30.1 % — ABNORMAL LOW (ref 36.0–46.0)
Hemoglobin: 10.5 g/dL — ABNORMAL LOW (ref 12.0–15.0)
Lymphocytes Relative: 22.8 % (ref 12.0–46.0)
Lymphs Abs: 0.9 10*3/uL (ref 0.7–4.0)
MCHC: 35 g/dL (ref 30.0–36.0)
MCV: 106.1 fl — ABNORMAL HIGH (ref 78.0–100.0)
Monocytes Absolute: 0.1 10*3/uL (ref 0.1–1.0)
Monocytes Relative: 2.2 % — ABNORMAL LOW (ref 3.0–12.0)
Neutro Abs: 2.7 10*3/uL (ref 1.4–7.7)
Neutrophils Relative %: 71.9 % (ref 43.0–77.0)
Platelets: 162 10*3/uL (ref 150.0–400.0)
RBC: 2.84 Mil/uL — ABNORMAL LOW (ref 3.87–5.11)
RDW: 21.1 % — ABNORMAL HIGH (ref 11.5–15.5)
WBC: 3.8 10*3/uL — ABNORMAL LOW (ref 4.0–10.5)

## 2021-10-22 LAB — COMPREHENSIVE METABOLIC PANEL
ALT: 12 U/L (ref 0–35)
AST: 26 U/L (ref 0–37)
Albumin: 4 g/dL (ref 3.5–5.2)
Alkaline Phosphatase: 67 U/L (ref 39–117)
BUN: 18 mg/dL (ref 6–23)
CO2: 29 mEq/L (ref 19–32)
Calcium: 9.1 mg/dL (ref 8.4–10.5)
Chloride: 99 mEq/L (ref 96–112)
Creatinine, Ser: 0.75 mg/dL (ref 0.40–1.20)
GFR: 72.91 mL/min (ref >60.00)
Glucose, Bld: 132 mg/dL — ABNORMAL HIGH (ref 70–99)
Potassium: 4.4 mEq/L (ref 3.5–5.1)
Sodium: 136 mEq/L (ref 135–145)
Total Bilirubin: 0.7 mg/dL (ref 0.2–1.2)
Total Protein: 6.3 g/dL (ref 6.0–8.3)

## 2021-10-22 LAB — IBC + FERRITIN
Ferritin: 363.3 ng/mL — ABNORMAL HIGH (ref 10.0–291.0)
Iron: 42 ug/dL (ref 42–145)
Saturation Ratios: 16.3 % — ABNORMAL LOW (ref 20.0–50.0)
TIBC: 257.6 ug/dL (ref 250.0–450.0)
Transferrin: 184 mg/dL — ABNORMAL LOW (ref 212.0–360.0)

## 2021-10-22 MED ORDER — IRON (FERROUS SULFATE) 325 (65 FE) MG PO TABS: 325.0000 mg | ORAL_TABLET | Freq: Every day | ORAL | 2 refills | Status: DC

## 2021-10-23 LAB — FOLATE RBC: RBC Folate: 993 ng/mL RBC (ref >280)

## 2021-10-28 DIAGNOSIS — D709 Neutropenia, unspecified: Secondary | ICD-10-CM | POA: Diagnosis not present

## 2021-10-28 DIAGNOSIS — I739 Peripheral vascular disease, unspecified: Secondary | ICD-10-CM | POA: Diagnosis not present

## 2021-10-28 DIAGNOSIS — M329 Systemic lupus erythematosus, unspecified: Secondary | ICD-10-CM | POA: Diagnosis not present

## 2021-10-28 DIAGNOSIS — D692 Other nonthrombocytopenic purpura: Secondary | ICD-10-CM | POA: Diagnosis not present

## 2021-10-28 DIAGNOSIS — I69354 Hemiplegia and hemiparesis following cerebral infarction affecting left non-dominant side: Secondary | ICD-10-CM | POA: Diagnosis not present

## 2021-10-28 DIAGNOSIS — C50912 Malignant neoplasm of unspecified site of left female breast: Secondary | ICD-10-CM | POA: Diagnosis not present

## 2021-10-28 DIAGNOSIS — Z79811 Long term (current) use of aromatase inhibitors: Secondary | ICD-10-CM | POA: Diagnosis not present

## 2021-11-03 ENCOUNTER — Other Ambulatory Visit: Payer: Self-pay | Admitting: Internal Medicine

## 2021-11-04 DIAGNOSIS — I10 Essential (primary) hypertension: Secondary | ICD-10-CM | POA: Diagnosis not present

## 2021-11-04 DIAGNOSIS — I5032 Chronic diastolic (congestive) heart failure: Secondary | ICD-10-CM | POA: Diagnosis not present

## 2021-11-13 ENCOUNTER — Ambulatory Visit: Payer: Self-pay | Admitting: Pharmacist

## 2021-11-13 NOTE — Chronic Care Management (AMB) (Signed)
?  Chronic Care Management  ? ?Note ? ?11/13/2021 ?Name: STEFFANY SCHOENFELDER MRN: [id number] DOB: 1934-10-06 ? ? ? ?Closing pharmacy CCM case at this time.  Patient has clinic contact information for future questions or concerns.  ? ?[name], PharmD, [place], CPP ?Clinical Pharmacist ?Therapist, music at [employer] ?[phone] ? ?

## 2021-11-19 ENCOUNTER — Ambulatory Visit (INDEPENDENT_AMBULATORY_CARE_PROVIDER_SITE_OTHER): Payer: PPO | Admitting: *Deleted

## 2021-11-19 DIAGNOSIS — C50912 Malignant neoplasm of unspecified site of left female breast: Secondary | ICD-10-CM

## 2021-11-19 DIAGNOSIS — I5042 Chronic combined systolic (congestive) and diastolic (congestive) heart failure: Secondary | ICD-10-CM

## 2021-11-19 DIAGNOSIS — I10 Essential (primary) hypertension: Secondary | ICD-10-CM

## 2021-11-19 NOTE — Chronic Care Management (AMB) (Addendum)
?Chronic Care Management  ? ?CCM RN Visit Note ? ?11/19/2021 ?Name: Kim Santiago MRN: [id number] DOB: 05-01-1935 ? ?Subjective: ?Kim Santiago is a 87 y.o. year old female who is a primary care patient of Tullo, Aris Everts, MD. The care management team was consulted for assistance with disease management and care coordination needs.   ? ?Engaged with patient by telephone for follow up visit in response to provider referral for case management and/or care coordination services.  ? ?Consent to Services:  ?The patient was given information about Chronic Care Management services, agreed to services, and gave verbal consent prior to initiation of services.  Please see initial visit note for detailed documentation.  ? ?Patient agreed to services and verbal consent obtained.  ? ?Assessment: Review of patient past medical history, allergies, medications, health status, including review of consultants reports, laboratory and other test data, was performed as part of comprehensive evaluation and provision of chronic care management services.  ? ?SDOH (Social Determinants of Health) assessments and interventions performed:   ? ?CCM Care Plan ? ?Allergies  ?Allergen Reactions  ? Azathioprine Nausea And Vomiting  ?  Severe vomiting  ? Hydroxychloroquine Hives and Nausea And Vomiting  ? Mycophenolate Mofetil Nausea Only  ? Amoxicillin Rash  ?  Has patient had a PCN reaction causing immediate rash, facial/tongue/throat swelling, SOB or lightheadedness with hypotension: Unknown ?Has patient had a PCN reaction causing severe rash involving mucus membranes or skin necrosis: Unknown ?Has patient had a PCN reaction that required hospitalization: Unknown ?Has patient had a PCN reaction occurring within the last 10 years: Unknown ?If all of the above answers are "NO", then may proceed with Cephalosporin use. ?Tolerates ceftriaxone and Ancef  ? Codeine Nausea And Vomiting  ? Naprosyn [Naproxen] Swelling  ? Orudis [Ketoprofen] Hives  ?  Sulfa Antibiotics Rash  ? Sulfathiazole Rash  ? ? ?Outpatient Encounter Medications as of 11/19/2021  ?Medication Sig Note  ? predniSONE (DELTASONE) 5 MG tablet Take 5 mg by mouth daily.   ? atorvastatin (LIPITOR) 20 MG tablet TAKE 1 TABLET BY MOUTH DAILY AT 6PM   ? Calcium Carbonate-Vitamin D 600-200 MG-UNIT TABS Take 2 tablets by mouth daily.   ? cholecalciferol (VITAMIN D) 1000 UNITS tablet Take 2,000 Units by mouth daily.   ? CRANBERRY PO Take 1 capsule by mouth 2 (two) times daily.   ? folic acid (FOLVITE) 1 MG tablet Take 2 mg by mouth daily.   ? furosemide (LASIX) 20 MG tablet 1 TABLET EVERY OTHER DAY AS NEEDED   ? Iron, Ferrous Sulfate, 325 (65 Fe) MG TABS Take 325 mg by mouth daily.   ? levothyroxine (SYNTHROID) 75 MCG tablet TAKE 1 TABLET BY MOUTH (75MCG TOTAL) DAILY   ? losartan (COZAAR) 25 MG tablet TAKE 1 TABLET BY MOUTH EVERY DAY   ? MELATONIN PO Take 1 tablet by mouth every evening.   ? methotrexate (RHEUMATREX) 2.5 MG tablet Take 15 mg by mouth once a week.   ? metoprolol succinate (TOPROL-XL) 25 MG 24 hr tablet TAKE 1 TABLET BY MOUTH EVERY DAY   ? nitroGLYCERIN (NITROSTAT) 0.4 MG SL tablet Place 1 tablet (0.4 mg total) under the tongue every 5 (five) minutes as needed for chest pain. Maximum 3 doses   ? omeprazole (PRILOSEC) 20 MG capsule TAKE 1 CAPSULE (20 MG TOTAL) BY MOUTH 2 (TWO) TIMES DAILY BEFORE A MEAL.   ? polyethylene glycol (MIRALAX / GLYCOLAX) packet Take 17 g by mouth daily.   ?  Probiotic Product (PROBIOTIC PO) Take 1 tablet by mouth daily.   ? sertraline (ZOLOFT) 25 MG tablet Take 25 mg by mouth daily.   ? sodium fluoride (PREVIDENT 5000 PLUS) 1.1 % CREA dental cream Take 1 application by mouth daily.    ? spironolactone (ALDACTONE) 25 MG tablet Take 1 tablet by mouth every other day.   ? tizanidine (ZANAFLEX) 2 MG capsule Take 1 capsule (2 mg total) by mouth 3 (three) times daily. 04/27/2018: PRN  ? traZODone (DESYREL) 50 MG tablet Take 50 mg by mouth at bedtime.   ? triamcinolone cream  (KENALOG) 0.1 % APPLY TO AFFECTED AREA TWICE A DAY   ? ?No facility-administered encounter medications on file as of 11/19/2021.  ? ? ?Patient Active Problem List  ? Diagnosis Date Noted  ? Chronic diastolic heart failure ([place]) 06/19/2021  ? COVID-19 virus infection 04/20/2021  ? Generalized weakness 11/23/2020  ? Vaccine reaction, initial encounter 08/20/2020  ? Other drug-induced neutropenia ([place]) 01/16/2020  ? Hemiplegia affecting dominant side, post-stroke ([place]) 01/16/2020  ? Does use hearing aid 07/14/2019  ? Hypothyroidism due to acquired atrophy of thyroid 04/08/2019  ? Impaired balance as late effect of cerebrovascular accident 11/13/2018  ? History of CVA with residual deficit 11/13/2018  ? Leukopenia 11/11/2018  ? Anxiety about health 07/05/2018  ? Mediastinal adenopathy 04/27/2018  ? Edema due to hypoalbuminemia 02/20/2018  ? Hospital discharge follow-up 02/20/2018  ? History of vertebral fracture 02/18/2018  ? GERD (gastroesophageal reflux disease) 10/31/2017  ? Hand joint pain 06/13/2017  ? Vaginal atrophy 05/11/2017  ? Venous insufficiency of both lower extremities 01/10/2017  ? Discoid lupus 06/26/2016  ? Anemia 05/24/2016  ? Recurrent UTI 04/29/2016  ? TMJ syndrome 03/31/2016  ? Hearing loss 03/12/2016  ? Osteopenia 12/05/2015  ? Malignant neoplasm of left female breast ([place]) 11/26/2015  ? Hx of thyroid cancer 06/12/2014  ? Adenomatous polyp 06/12/2014  ? Major depressive disorder, recurrent episode, moderate ([place]) 05/29/2014  ? Grief 04/08/2014  ? Sjogren's syndrome ([place]) 03/09/2014  ? Insomnia due to stress 03/06/2014  ? Hyperlipidemia 12/28/2012  ? Medicare annual wellness visit, subsequent 12/28/2012  ? Essential hypertension 10/02/2011  ? Iatrogenic hypothyroidism 10/02/2011  ? Purpura ([place]) 05/25/2011  ? ? ?Conditions to be addressed/monitored:CHF and HTN ? ?Care Plan : CCM RNCM Care Plane  ?Updates made by Leona Singleton, RN since 11/19/2021 12:00 AM  ?  ? ?Problem: Knowledge deficiet related  to management of CHF, HTN   ?Priority: Medium  ?  ? ?Long-Range Goal: Patient will work with CM team to gain knowledge of self health care knowledge and management   ?Start Date: 06/30/2021  ?Expected End Date: 12/05/2021  ?Recent Progress: On track  ?Priority: Medium  ?Note:   ?Current Barriers:  ?Knowledge Deficits related to plan of care for management of CHF and HTN   reports weighing daily, weight 137 pounds (increased from post hospital weight gain, doesn't think it is fluid; FEELS PREDNISONE HAS INCREASED HER APPETITE).  Monitors blood pressures, last 110-140/80'S (which is what it has been ranging).  Denies any chest pain or shortness of breath.  Some swelling during the day that resolves at night.  Does report continued Lupus flare and now taking weekly prednisone.  Is working with [place], denies any recent falls ? ?RNCM Clinical Goal(s):  ?Patient will verbalize understanding of plan for management of CHF and HTN  ?verbalize basic understanding of CHF and HTN disease process and self health management plan as previous ?take  all medications exactly as prescribed and will call provider for medication related questions through collaboration with RN Care manager, provider, and care team.  ? ?Interventions: ?1:1 collaboration with primary care provider regarding development and update of comprehensive plan of care as evidenced by provider attestation and co-signature ?Inter-disciplinary care team collaboration (see longitudinal plan of care) ?Evaluation of current treatment plan related to  self management and patient's adherence to plan as established by provider ? ?Heart Failure Interventions:  (Status: Goal on track: NO.)   Long Term Goal ?Basic overview and discussion of pathophysiology of Heart Failure reviewed ?Provided education on low sodium diet ?Reviewed Heart Failure Action Plan in depth and provided written copy ?Assessed need for readable accurate scales in home ?Provided education about placing scale  on hard, flat surface ?Advised patient to weigh each morning after emptying bladder ?Discussed importance of daily weight and advised patient to weigh and record daily ?Reviewed role of diuretics in

## 2021-11-19 NOTE — Patient Instructions (Signed)
Visit Information ? ?Thank you for taking time to visit with me today. Please don't hesitate to contact me if I can be of assistance to you before our next scheduled telephone appointment. ? ?Following are the goals we discussed today:  ?Patient will self administer medications as prescribed as evidenced by self report/primary caregiver report  ?Patient will attend all scheduled provider appointments as evidenced by clinician review of documented attendance to scheduled appointments and patient/caregiver report ?Call office if I gain more than 2 pounds in one day or 5 pounds in one week ?Keep legs up while sitting ?Use salt in moderation ?Watch for swelling in feet, ankles and legs every day ?Weigh myself daily writing in log ?Check blood pressures at least once a week keeping log for provider review ?Continue with home health therapy ?Fall precautions and preventions ? ?Our next appointment is by telephone on 4/28 at 1000 ? ?Please call the care guide team at [phone] if you need to cancel or reschedule your appointment.  ? ?If you are experiencing a Mental Health or [place] or need someone to talk to, please call the Suicide and Crisis Lifeline: 988 ?call the [place] National Suicide Prevention Lifeline: 432-019-1555 or TTY: 812-527-8391 TTY 760-817-5334) to talk to a trained counselor ?call 1-800-273-TALK (toll free, 24 hour hotline) ?call 911  ? ?Patient verbalizes understanding of instructions and care plan provided today and agrees to view in [place]. Active MyChart status confirmed with patient.   ? ?Hubert Azure RN, MSN ?RN Care Management Coordinator ?[place] ?[phone] ?[email or web address] ?  ?

## 2021-12-02 ENCOUNTER — Other Ambulatory Visit: Payer: Self-pay

## 2021-12-02 ENCOUNTER — Other Ambulatory Visit (INDEPENDENT_AMBULATORY_CARE_PROVIDER_SITE_OTHER): Payer: PPO

## 2021-12-02 DIAGNOSIS — Z79899 Other long term (current) drug therapy: Secondary | ICD-10-CM

## 2021-12-02 LAB — COMPREHENSIVE METABOLIC PANEL
ALT: 10 U/L (ref 0–35)
AST: 22 U/L (ref 0–37)
Albumin: 4.1 g/dL (ref 3.5–5.2)
Alkaline Phosphatase: 64 U/L (ref 39–117)
BUN: 20 mg/dL (ref 6–23)
CO2: 29 mEq/L (ref 19–32)
Calcium: 9.3 mg/dL (ref 8.4–10.5)
Chloride: 99 mEq/L (ref 96–112)
Creatinine, Ser: 0.8 mg/dL (ref 0.40–1.20)
GFR: 67.43 mL/min (ref >60.00)
Glucose, Bld: 111 mg/dL — ABNORMAL HIGH (ref 70–99)
Potassium: 4.5 mEq/L (ref 3.5–5.1)
Sodium: 135 mEq/L (ref 135–145)
Total Bilirubin: 0.8 mg/dL (ref 0.2–1.2)
Total Protein: 6.2 g/dL (ref 6.0–8.3)

## 2021-12-02 LAB — CBC WITH DIFFERENTIAL/PLATELET
Basophils Absolute: 0.1 10*3/uL (ref 0.0–0.1)
Basophils Relative: 1.7 % (ref 0.0–3.0)
Eosinophils Absolute: 0.1 10*3/uL (ref 0.0–0.7)
Eosinophils Relative: 1.5 % (ref 0.0–5.0)
HCT: 30.6 % — ABNORMAL LOW (ref 36.0–46.0)
Hemoglobin: 10.9 g/dL — ABNORMAL LOW (ref 12.0–15.0)
Lymphocytes Relative: 21.8 % (ref 12.0–46.0)
Lymphs Abs: 0.9 10*3/uL (ref 0.7–4.0)
MCHC: 35.6 g/dL (ref 30.0–36.0)
MCV: 111.5 fl — ABNORMAL HIGH (ref 78.0–100.0)
Monocytes Absolute: 0.1 10*3/uL (ref 0.1–1.0)
Monocytes Relative: 2.2 % — ABNORMAL LOW (ref 3.0–12.0)
Neutro Abs: 2.9 10*3/uL (ref 1.4–7.7)
Neutrophils Relative %: 72.8 % (ref 43.0–77.0)
Platelets: 136 10*3/uL — ABNORMAL LOW (ref 150.0–400.0)
RBC: 2.75 Mil/uL — ABNORMAL LOW (ref 3.87–5.11)
RDW: 18.5 % — ABNORMAL HIGH (ref 11.5–15.5)
WBC: 4 10*3/uL (ref 4.0–10.5)

## 2021-12-05 DIAGNOSIS — I10 Essential (primary) hypertension: Secondary | ICD-10-CM | POA: Diagnosis not present

## 2021-12-05 DIAGNOSIS — I5042 Chronic combined systolic (congestive) and diastolic (congestive) heart failure: Secondary | ICD-10-CM

## 2021-12-05 DIAGNOSIS — C50912 Malignant neoplasm of unspecified site of left female breast: Secondary | ICD-10-CM | POA: Diagnosis not present

## 2021-12-05 DIAGNOSIS — Z170 Estrogen receptor positive status [ER+]: Secondary | ICD-10-CM

## 2021-12-23 ENCOUNTER — Telehealth (INDEPENDENT_AMBULATORY_CARE_PROVIDER_SITE_OTHER): Payer: PPO | Admitting: Internal Medicine

## 2021-12-23 ENCOUNTER — Encounter: Payer: Self-pay | Admitting: Internal Medicine

## 2021-12-23 VITALS — Ht 62.0 in | Wt 140.0 lb

## 2021-12-23 DIAGNOSIS — R040 Epistaxis: Secondary | ICD-10-CM | POA: Insufficient documentation

## 2021-12-23 DIAGNOSIS — D509 Iron deficiency anemia, unspecified: Secondary | ICD-10-CM | POA: Insufficient documentation

## 2021-12-23 MED ORDER — FERROUS GLUCONATE 324 (38 FE) MG PO TABS: 324.0000 mg | ORAL_TABLET | Freq: Every day | ORAL | 3 refills | Status: DC

## 2021-12-23 NOTE — Patient Instructions (Signed)
Please stop the iron for one week and start the new iron after a week of rest ? ?Please obtain Afrin nasal spray to use when you have a nosebleed.   ? ?Start taking claritin for allergies.  ? ?Irrigate sinuses gently with [place] nasal spray ? ?If nosebleeds continue,  I will refer you to ENT for evaluatoin  ?

## 2021-12-23 NOTE — Assessment & Plan Note (Signed)
Saline irrigation, Afrin , claritin for allergies.  Workup for coagulopathy .  If normal refer to ENT for fiberoptic evaluation of right maxillary sinus  ?

## 2021-12-23 NOTE — Progress Notes (Signed)
Virtual Visit via Caregility  Note ? ?This visit type was conducted due to national recommendations for restrictions regarding the COVID-19 pandemic (e.g. social distancing).  This format is felt to be most appropriate for this patient at this time.  All issues noted in this document were discussed and addressed.  No physical exam was performed (except for noted visual exam findings with Video Visits).  ? ?I connected withNAME@ on 12/23/21 at  4:30 PM EDT by a video enabled telemedicine application  and verified that I am speaking with the correct person using two identifiers. ?Location patient: home ?Location provider: work or home office ?Persons participating in the virtual visit: patient, provider ? ?I discussed the limitations, risks, security and privacy concerns of performing an evaluation and management service by telephone and the availability of in person appointments. I also discussed with the patient that there may be a patient responsible charge related to this service. The patient expressed understanding and agreed to proceed. ? ? ?Reason for visit: multiple issues  ? ?HPI: ? ?87 yr old female with discoid lupus,  Sjogren's syndrome presents with 1) recurrent epistaxis,  spontaneous and provoked by blowing nose.  For the last several weeks  mostly on the right side but once on the left.  Rigth maxillary sinus aches at times ,  especially after being outside in the pollen.  Not taking steroid nasal spray or antihistamine/ ?2) 2-3 Explosive BMS several times daily since starting ferrous sulfate  ? ? ?ROS: See pertinent positives and negatives per HPI. ? ?Past Medical History:  ?Diagnosis Date  ? Arthritis   ? Breast cancer ([place]) 2017  ? left mastectomy done 11/2015  ? Breast cancer in female [facility]) 11/18/2015  ? Left: 3.9 cm tumor, T2, 1/2 sentinel nodes positive for macro metastatic disease, N1, 3 negative nodes in the axillary tail, ER+,PR+, Her 2 neu, low Mammoprint score  ? Cancer [facility])   ? thyroid  takes levothyroxine  ? CAP (community acquired pneumonia) 05/05/2021  ? Chronic kidney disease   ? UTI  ? Genetic screening March 2017.  ? Mammoprint of left breast cancer: Low risk for recurrence.   ? History of heart attack 06/12/2014  ? Hypertension   ? hypothyroidism   ? secondary to thyroidectomy for thyroid ca  ? Hypothyroidism   ? Lupus ([place])   ? subcutaneous  ? Menopause 40s  ? natural, hot flashes and mood lability now gone, off prempro 7 months  ? Myocardial infarction [facility]) 2013  ? Osteoporosis   ? Osteopenia  ? Rosacea   ? Sjoegren syndrome   ? Stress-induced cardiomyopathy September of 2013  ? EF 35%. Peak troponin was 1.8.  ? ? ?Past Surgical History:  ?Procedure Laterality Date  ? BACK SURGERY    ? BREAST BIOPSY Left 10/30/15  ? positive, done in Dr. Dwyane Luo office  ? CARDIAC CATHETERIZATION  05/2012  ? [place]. No significant CAD. Ejection fraction of 35% due to stress-induced cardiomyopathy.  ? CHOLECYSTECTOMY    ? COLONOSCOPY    ? DILATION AND CURETTAGE OF UTERUS    ? [place] SURGERY  Feb 2008  ? L1, Dr. Mauri Pole  ? LUMBAR DISC SURGERY    ? L4-L5  ? MASTECTOMY Left 11/18/2015  ? positive  ? SENTINEL NODE BIOPSY Left 11/18/2015  ? Procedure: SENTINEL NODE BIOPSY;  Surgeon: Robert Bellow, MD;  Location: [facility] ORS;  Service: General;  Laterality: Left;  ? SHOULDER ARTHROSCOPY  2004  ? Left, Dr. Jefm Bryant  ?  SIMPLE MASTECTOMY WITH AXILLARY SENTINEL NODE BIOPSY Left 11/18/2015  ? Procedure: SIMPLE MASTECTOMY;  Surgeon: Robert Bellow, MD;  Location: [facility] ORS;  Service: General;  Laterality: Left;  ? SPINE SURGERY    ? L4-5 diskectomy  ? THYROIDECTOMY    ? Thyroid Cancer  ? TONSILLECTOMY    ? TUBAL LIGATION    ? ? ?Family History  ?Problem Relation Age of Onset  ? Kidney disease Mother   ? Heart disease Mother   ? COPD Father   ? Cancer Father   ?     esophageal  ? Kidney disease Sister   ? Cancer Brother 60  ?     colon cancer (both brothers)  ? Heart attack Brother 69  ? Heart disease Brother   ? Breast  cancer Neg Hx   ? ? ?SOCIAL HX:  reports that she has never smoked. She has never used smokeless tobacco. She reports that she does not drink alcohol and does not use drugs.  ? ?Current Outpatient Medications:  ?  atorvastatin (LIPITOR) 20 MG tablet, TAKE 1 TABLET BY MOUTH DAILY AT 6PM, Disp: 90 tablet, Rfl: 1 ?  Calcium Carbonate-Vitamin D 600-200 MG-UNIT TABS, Take 2 tablets by mouth daily., Disp: , Rfl:  ?  cholecalciferol (VITAMIN D) 1000 UNITS tablet, Take 2,000 Units by mouth daily., Disp: , Rfl:  ?  CRANBERRY PO, Take 1 capsule by mouth 2 (two) times daily., Disp: , Rfl:  ?  ferrous gluconate (FERGON) 324 MG tablet, Take 1 tablet (324 mg total) by mouth daily with breakfast., Disp: 30 tablet, Rfl: 3 ?  folic acid (FOLVITE) 1 MG tablet, Take 2 mg by mouth daily., Disp: , Rfl:  ?  furosemide (LASIX) 20 MG tablet, 1 TABLET EVERY OTHER DAY AS NEEDED, Disp: 45 tablet, Rfl: 2 ?  Iron, Ferrous Sulfate, 325 (65 Fe) MG TABS, Take 325 mg by mouth daily., Disp: 30 tablet, Rfl: 2 ?  levothyroxine (SYNTHROID) 75 MCG tablet, TAKE 1 TABLET BY MOUTH (75MCG TOTAL) DAILY, Disp: 90 tablet, Rfl: 2 ?  MELATONIN PO, Take 1 tablet by mouth every evening., Disp: , Rfl:  ?  methotrexate (RHEUMATREX) 2.5 MG tablet, Take 15 mg by mouth once a week., Disp: , Rfl:  ?  metoprolol succinate (TOPROL-XL) 25 MG 24 hr tablet, TAKE 1 TABLET BY MOUTH EVERY DAY, Disp: 90 tablet, Rfl: 3 ?  nitroGLYCERIN (NITROSTAT) 0.4 MG SL tablet, Place 1 tablet (0.4 mg total) under the tongue every 5 (five) minutes as needed for chest pain. Maximum 3 doses, Disp: 50 tablet, Rfl: 3 ?  omeprazole (PRILOSEC) 20 MG capsule, TAKE 1 CAPSULE (20 MG TOTAL) BY MOUTH 2 (TWO) TIMES DAILY BEFORE A MEAL., Disp: 180 capsule, Rfl: 1 ?  polyethylene glycol (MIRALAX / GLYCOLAX) packet, Take 17 g by mouth daily., Disp: , Rfl:  ?  Probiotic Product (PROBIOTIC PO), Take 1 tablet by mouth daily., Disp: , Rfl:  ?  sertraline (ZOLOFT) 25 MG tablet, Take 25 mg by mouth daily., Disp: ,  Rfl:  ?  sodium fluoride (PREVIDENT 5000 PLUS) 1.1 % CREA dental cream, Take 1 application by mouth daily. , Disp: , Rfl:  ?  spironolactone (ALDACTONE) 25 MG tablet, Take 1 tablet by mouth every other day., Disp: , Rfl:  ?  triamcinolone cream (KENALOG) 0.1 %, APPLY TO AFFECTED AREA TWICE A DAY, Disp: , Rfl:  ?  losartan (COZAAR) 25 MG tablet, TAKE 1 TABLET BY MOUTH EVERY DAY (Patient not taking: Reported  on 12/23/2021), Disp: 90 tablet, Rfl: 1 ?  predniSONE (DELTASONE) 5 MG tablet, Take 5 mg by mouth daily. (Patient not taking: Reported on 12/23/2021), Disp: , Rfl:  ? ?EXAM: ? ?VITALS per patient if applicable: ? ?GENERAL: alert, oriented, appears well and in no acute distress ? ?HEENT: atraumatic, conjunttiva clear, no obvious abnormalities on inspection of external nose and ears ? ?NECK: normal movements of the head and neck ? ?LUNGS: on inspection no signs of respiratory distress, breathing rate appears normal, no obvious gross SOB, gasping or wheezing ? ?CV: no obvious cyanosis ? ?MS: moves all visible extremities without noticeable abnormality ? ?PSYCH/NEURO: pleasant and cooperative, no obvious depression or anxiety, speech and thought processing grossly intact ? ?ASSESSMENT AND PLAN: ? ?Discussed the following assessment and plan: ? ?Frequent epistaxis - Plan: CBC with Differential/Platelet, Protime-INR, PTT ? ?Iron deficiency anemia, unspecified iron deficiency anemia type ? ?Iron deficiency anemia ?Not tolerating ferrous sulfate due to explosive BM's.  Suspend supplement for one week,  Restart supplementation with ferrous gluconate  ? ?Frequent epistaxis ?Saline irrigation, Afrin , claritin for allergies.  Workup for coagulopathy .  If normal refer to ENT for fiberoptic evaluation of right maxillary sinus  ? ?  ?I discussed the assessment and treatment plan with the patient. The patient was provided an opportunity to ask questions and all were answered. The patient agreed with the plan and demonstrated an  understanding of the instructions. ?  ?The patient was advised to call back or seek an in-person evaluation if the symptoms worsen or if the condition fails to improve as anticipated. ? ? ?Kim Santiago,

## 2021-12-23 NOTE — Assessment & Plan Note (Signed)
Not tolerating ferrous sulfate due to explosive BM's.  Suspend supplement for one week,  Restart supplementation with ferrous gluconate  ?

## 2021-12-26 ENCOUNTER — Other Ambulatory Visit (INDEPENDENT_AMBULATORY_CARE_PROVIDER_SITE_OTHER): Payer: PPO

## 2021-12-26 DIAGNOSIS — L931 Subacute cutaneous lupus erythematosus: Secondary | ICD-10-CM | POA: Diagnosis not present

## 2021-12-26 DIAGNOSIS — R040 Epistaxis: Secondary | ICD-10-CM | POA: Diagnosis not present

## 2021-12-26 DIAGNOSIS — L932 Other local lupus erythematosus: Secondary | ICD-10-CM | POA: Diagnosis not present

## 2021-12-26 DIAGNOSIS — D692 Other nonthrombocytopenic purpura: Secondary | ICD-10-CM | POA: Diagnosis not present

## 2021-12-26 DIAGNOSIS — T691XXD Chilblains, subsequent encounter: Secondary | ICD-10-CM | POA: Diagnosis not present

## 2021-12-26 DIAGNOSIS — Z79899 Other long term (current) drug therapy: Secondary | ICD-10-CM | POA: Diagnosis not present

## 2021-12-26 LAB — CBC WITH DIFFERENTIAL/PLATELET
Basophils Absolute: 0.1 10*3/uL (ref 0.0–0.1)
Basophils Relative: 2.7 % (ref 0.0–3.0)
Eosinophils Absolute: 0.2 10*3/uL (ref 0.0–0.7)
Eosinophils Relative: 5.7 % — ABNORMAL HIGH (ref 0.0–5.0)
HCT: 30.5 % — ABNORMAL LOW (ref 36.0–46.0)
Hemoglobin: 10.8 g/dL — ABNORMAL LOW (ref 12.0–15.0)
Lymphocytes Relative: 40.5 % (ref 12.0–46.0)
Lymphs Abs: 1.3 10*3/uL (ref 0.7–4.0)
MCHC: 35.3 g/dL (ref 30.0–36.0)
MCV: 110.7 fl — ABNORMAL HIGH (ref 78.0–100.0)
Monocytes Absolute: 0.3 10*3/uL (ref 0.1–1.0)
Monocytes Relative: 7.8 % (ref 3.0–12.0)
Neutro Abs: 1.4 10*3/uL (ref 1.4–7.7)
Neutrophils Relative %: 43.3 % (ref 43.0–77.0)
Platelets: 134 10*3/uL — ABNORMAL LOW (ref 150.0–400.0)
RBC: 2.76 Mil/uL — ABNORMAL LOW (ref 3.87–5.11)
RDW: 18.3 % — ABNORMAL HIGH (ref 11.5–15.5)
WBC: 3.3 10*3/uL — ABNORMAL LOW (ref 4.0–10.5)

## 2021-12-26 LAB — APTT: aPTT: 30.1 s (ref 23.4–32.7)

## 2021-12-26 LAB — PROTIME-INR
INR: 1 ratio (ref 0.8–1.0)
Prothrombin Time: 10.7 s (ref 9.6–13.1)

## 2021-12-27 ENCOUNTER — Other Ambulatory Visit: Payer: Self-pay | Admitting: Internal Medicine

## 2021-12-27 ENCOUNTER — Encounter: Payer: Self-pay | Admitting: Internal Medicine

## 2021-12-27 DIAGNOSIS — D61818 Other pancytopenia: Secondary | ICD-10-CM

## 2022-01-02 ENCOUNTER — Telehealth: Payer: Self-pay | Admitting: Internal Medicine

## 2022-01-02 ENCOUNTER — Ambulatory Visit (INDEPENDENT_AMBULATORY_CARE_PROVIDER_SITE_OTHER): Payer: PPO | Admitting: *Deleted

## 2022-01-02 DIAGNOSIS — I10 Essential (primary) hypertension: Secondary | ICD-10-CM

## 2022-01-02 DIAGNOSIS — I5042 Chronic combined systolic (congestive) and diastolic (congestive) heart failure: Secondary | ICD-10-CM

## 2022-01-02 NOTE — Chronic Care Management (AMB) (Signed)
?Chronic Care Management  ? ?CCM RN Visit Note ? ?01/02/2022 ?Name: Kim Santiago MRN: [id number] DOB: March 28, 1935 ? ?Subjective: ?Kim Santiago is a 87 y.o. year old female who is a primary care patient of Tullo, Aris Everts, MD. The care management team was consulted for assistance with disease management and care coordination needs.   ? ?Engaged with patient by telephone for follow up visit in response to provider referral for case management and/or care coordination services.  ? ?Consent to Services:  ?The patient was given information about Chronic Care Management services, agreed to services, and gave verbal consent prior to initiation of services.  Please see initial visit note for detailed documentation.  ? ?Patient agreed to services and verbal consent obtained.  ? ?Assessment: Review of patient past medical history, allergies, medications, health status, including review of consultants reports, laboratory and other test data, was performed as part of comprehensive evaluation and provision of chronic care management services.  ? ?SDOH (Social Determinants of Health) assessments and interventions performed:   ? ?CCM Care Plan ? ?Allergies  ?Allergen Reactions  ? Azathioprine Nausea And Vomiting  ?  Severe vomiting  ? Hydroxychloroquine Hives and Nausea And Vomiting  ? Mycophenolate Mofetil Nausea Only  ? Amoxicillin Rash  ?  Has patient had a PCN reaction causing immediate rash, facial/tongue/throat swelling, SOB or lightheadedness with hypotension: Unknown ?Has patient had a PCN reaction causing severe rash involving mucus membranes or skin necrosis: Unknown ?Has patient had a PCN reaction that required hospitalization: Unknown ?Has patient had a PCN reaction occurring within the last 10 years: Unknown ?If all of the above answers are "NO", then may proceed with Cephalosporin use. ?Tolerates ceftriaxone and Ancef  ? Codeine Nausea And Vomiting  ? Naprosyn [Naproxen] Swelling  ? Orudis [Ketoprofen] Hives  ?  Sulfa Antibiotics Rash  ? Sulfathiazole Rash  ? ? ?Outpatient Encounter Medications as of 01/02/2022  ?Medication Sig  ? methotrexate (RHEUMATREX) 2.5 MG tablet Take 15 mg by mouth once a week.  ? atorvastatin (LIPITOR) 20 MG tablet TAKE 1 TABLET BY MOUTH DAILY AT 6PM  ? Calcium Carbonate-Vitamin D 600-200 MG-UNIT TABS Take 2 tablets by mouth daily.  ? cholecalciferol (VITAMIN D) 1000 UNITS tablet Take 2,000 Units by mouth daily.  ? CRANBERRY PO Take 1 capsule by mouth 2 (two) times daily.  ? ferrous gluconate (FERGON) 324 MG tablet Take 1 tablet (324 mg total) by mouth daily with breakfast.  ? folic acid (FOLVITE) 1 MG tablet Take 2 mg by mouth daily.  ? furosemide (LASIX) 20 MG tablet 1 TABLET EVERY OTHER DAY AS NEEDED  ? Iron, Ferrous Sulfate, 325 (65 Fe) MG TABS Take 325 mg by mouth daily.  ? levothyroxine (SYNTHROID) 75 MCG tablet TAKE 1 TABLET BY MOUTH (75MCG TOTAL) DAILY  ? losartan (COZAAR) 25 MG tablet TAKE 1 TABLET BY MOUTH EVERY DAY (Patient not taking: Reported on 12/23/2021)  ? MELATONIN PO Take 1 tablet by mouth every evening.  ? metoprolol succinate (TOPROL-XL) 25 MG 24 hr tablet TAKE 1 TABLET BY MOUTH EVERY DAY  ? nitroGLYCERIN (NITROSTAT) 0.4 MG SL tablet Place 1 tablet (0.4 mg total) under the tongue every 5 (five) minutes as needed for chest pain. Maximum 3 doses  ? omeprazole (PRILOSEC) 20 MG capsule TAKE 1 CAPSULE (20 MG TOTAL) BY MOUTH 2 (TWO) TIMES DAILY BEFORE A MEAL.  ? polyethylene glycol (MIRALAX / GLYCOLAX) packet Take 17 g by mouth daily.  ? predniSONE (DELTASONE) 5 MG tablet  Take 5 mg by mouth daily. (Patient not taking: Reported on 12/23/2021)  ? Probiotic Product (PROBIOTIC PO) Take 1 tablet by mouth daily.  ? sertraline (ZOLOFT) 25 MG tablet Take 25 mg by mouth daily.  ? sodium fluoride (PREVIDENT 5000 PLUS) 1.1 % CREA dental cream Take 1 application by mouth daily.   ? spironolactone (ALDACTONE) 25 MG tablet Take 1 tablet by mouth every other day.  ? triamcinolone cream (KENALOG) 0.1 %  APPLY TO AFFECTED AREA TWICE A DAY  ? ?No facility-administered encounter medications on file as of 01/02/2022.  ? ? ?Patient Active Problem List  ? Diagnosis Date Noted  ? Pancytopenia ([place]) 12/27/2021  ? Iron deficiency anemia 12/23/2021  ? Frequent epistaxis 12/23/2021  ? Chronic diastolic heart failure ([place]) 06/19/2021  ? COVID-19 virus infection 04/20/2021  ? Generalized weakness 11/23/2020  ? Vaccine reaction, initial encounter 08/20/2020  ? Other drug-induced neutropenia ([place]) 01/16/2020  ? Hemiplegia affecting dominant side, post-stroke ([place]) 01/16/2020  ? Does use hearing aid 07/14/2019  ? Hypothyroidism due to acquired atrophy of thyroid 04/08/2019  ? Impaired balance as late effect of cerebrovascular accident 11/13/2018  ? History of CVA with residual deficit 11/13/2018  ? Leukopenia 11/11/2018  ? Anxiety about health 07/05/2018  ? Mediastinal adenopathy 04/27/2018  ? Edema due to hypoalbuminemia 02/20/2018  ? Hospital discharge follow-up 02/20/2018  ? History of vertebral fracture 02/18/2018  ? GERD (gastroesophageal reflux disease) 10/31/2017  ? Hand joint pain 06/13/2017  ? Vaginal atrophy 05/11/2017  ? Venous insufficiency of both lower extremities 01/10/2017  ? Discoid lupus 06/26/2016  ? Anemia 05/24/2016  ? Recurrent UTI 04/29/2016  ? TMJ syndrome 03/31/2016  ? Hearing loss 03/12/2016  ? Osteopenia 12/05/2015  ? Malignant neoplasm of left female breast ([place]) 11/26/2015  ? Hx of thyroid cancer 06/12/2014  ? Adenomatous polyp 06/12/2014  ? Major depressive disorder, recurrent episode, moderate ([place]) 05/29/2014  ? Grief 04/08/2014  ? Sjogren's syndrome ([place]) 03/09/2014  ? Insomnia due to stress 03/06/2014  ? Hyperlipidemia 12/28/2012  ? Medicare annual wellness visit, subsequent 12/28/2012  ? Essential hypertension 10/02/2011  ? Iatrogenic hypothyroidism 10/02/2011  ? Purpura ([place]) 05/25/2011  ? ? ?Conditions to be addressed/monitored:CHF and HTN ? ?Care Plan : CCM RNCM Care Plane  ?Updates made by  Leona Singleton, RN since 01/02/2022 12:00 AM  ?  ? ?Problem: Knowledge deficiet related to management of CHF, HTN   ?Priority: Medium  ?  ? ?Long-Range Goal: Patient will work with CM team to gain knowledge of self health care knowledge and management   ?Start Date: 06/30/2021  ?Expected End Date: 12/05/2021  ?Recent Progress: On track  ?Priority: Medium  ?Note:   ?Current Barriers:  ?Knowledge Deficits related to plan of care for management of CHF and HTN   reports weighing daily, weight 137 pounds.  Monitors blood pressures, last 120-140/80'S.   Denies any chest pain or shortness of breath.  Some swelling during the day that resolves at night.  Does report continued Lupus flare and no longer taking weekly prednisone.  Has completed HHPT, denies any recent falls.  Awaiting hematology consult. ? ?RNCM Clinical Goal(s):  ?Patient will verbalize understanding of plan for management of CHF and HTN  ?verbalize basic understanding of CHF and HTN disease process and self health management plan as previous ?take all medications exactly as prescribed and will call provider for medication related questions through collaboration with RN Care manager, provider, and care team.  ? ?Interventions: ?1:1 collaboration with primary  care provider regarding development and update of comprehensive plan of care as evidenced by provider attestation and co-signature ?Inter-disciplinary care team collaboration (see longitudinal plan of care) ?Evaluation of current treatment plan related to  self management and patient's adherence to plan as established by provider ? ?Heart Failure Interventions:  (Status: Goal on Track (progressing): YES.)   Long Term Goal ?Basic overview and discussion of pathophysiology of Heart Failure reviewed ?Provided education on low sodium diet ?Reviewed Heart Failure Action Plan in depth and provided written copy ?Assessed need for readable accurate scales in home ?Provided education about placing scale on hard,  flat surface ?Advised patient to weigh each morning after emptying bladder ?Discussed importance of daily weight and advised patient to weigh and record daily ?Reviewed role of diuretics in prevention o

## 2022-01-02 NOTE — Telephone Encounter (Signed)
Dr. Derrel Nip has spoke with the provider.  ?

## 2022-01-02 NOTE — Telephone Encounter (Signed)
Dr. Will Bonnet  from [facility] called stating she need to talk to the provider about medication-MTX for the pt ?

## 2022-01-02 NOTE — Telephone Encounter (Signed)
Dr. Will Bonnet can be called back at [phone]. ?

## 2022-01-02 NOTE — Patient Instructions (Addendum)
Visit Information ? ?Thank you for taking time to visit with me today. Please don't hesitate to contact me if I can be of assistance to you before our next scheduled telephone appointment. ? ?Following are the goals we discussed today:  ?Patient will self administer medications as prescribed as evidenced by self report/primary caregiver report  ?Patient will attend all scheduled provider appointments as evidenced by clinician review of documented attendance to scheduled appointments and patient/caregiver report ?Call office if I gain more than 2 pounds in one day or 5 pounds in one week ?Keep legs up while sitting ?Use salt in moderation ?Watch for swelling in feet, ankles and legs every day ?Weigh myself daily writing in log ?Check blood pressures at least once a week keeping log for provider review ?Fall precautions and preventions ? ?Our next appointment is by telephone on 6/2 at 1000 ? ?Please call the care guide team at [phone] if you need to cancel or reschedule your appointment.  ? ?If you are experiencing a Mental Health or [place] or need someone to talk to, please call the Suicide and Crisis Lifeline: 988 ?call the [place] National Suicide Prevention Lifeline: (727)479-6213 or TTY: (661)833-8273 TTY 6140290697) to talk to a trained counselor ?call 1-800-273-TALK (toll free, 24 hour hotline) ?call 911  ? ?Patient verbalizes understanding of instructions and care plan provided today and agrees to view in [place]. Active MyChart status confirmed with patient.   ? ?Hubert Azure RN, MSN ?RN Care Management Coordinator ?[place] ?[phone] ?[email or web address] ? ?

## 2022-01-02 NOTE — Telephone Encounter (Signed)
Pt returning call. Pt requesting callback.  °

## 2022-01-04 DIAGNOSIS — I5042 Chronic combined systolic (congestive) and diastolic (congestive) heart failure: Secondary | ICD-10-CM | POA: Diagnosis not present

## 2022-01-04 DIAGNOSIS — I10 Essential (primary) hypertension: Secondary | ICD-10-CM

## 2022-01-06 ENCOUNTER — Inpatient Hospital Stay: Payer: PPO | Attending: Oncology | Admitting: Oncology

## 2022-01-06 ENCOUNTER — Inpatient Hospital Stay: Payer: PPO

## 2022-01-06 ENCOUNTER — Encounter: Payer: Self-pay | Admitting: Oncology

## 2022-01-06 VITALS — BP 116/78 | HR 72 | Temp 98.4°F | Resp 16 | Ht 62.0 in | Wt 142.5 lb

## 2022-01-06 DIAGNOSIS — E039 Hypothyroidism, unspecified: Secondary | ICD-10-CM | POA: Insufficient documentation

## 2022-01-06 DIAGNOSIS — M3214 Glomerular disease in systemic lupus erythematosus: Secondary | ICD-10-CM | POA: Insufficient documentation

## 2022-01-06 DIAGNOSIS — Z862 Personal history of diseases of the blood and blood-forming organs and certain disorders involving the immune mechanism: Secondary | ICD-10-CM | POA: Insufficient documentation

## 2022-01-06 DIAGNOSIS — Z800 Family history of malignant neoplasm of digestive organs: Secondary | ICD-10-CM | POA: Diagnosis not present

## 2022-01-06 DIAGNOSIS — D539 Nutritional anemia, unspecified: Secondary | ICD-10-CM

## 2022-01-06 DIAGNOSIS — Z8673 Personal history of transient ischemic attack (TIA), and cerebral infarction without residual deficits: Secondary | ICD-10-CM | POA: Insufficient documentation

## 2022-01-06 DIAGNOSIS — D61818 Other pancytopenia: Secondary | ICD-10-CM

## 2022-01-06 DIAGNOSIS — D7589 Other specified diseases of blood and blood-forming organs: Secondary | ICD-10-CM | POA: Diagnosis not present

## 2022-01-06 DIAGNOSIS — Z79899 Other long term (current) drug therapy: Secondary | ICD-10-CM | POA: Insufficient documentation

## 2022-01-06 DIAGNOSIS — Z8616 Personal history of COVID-19: Secondary | ICD-10-CM | POA: Insufficient documentation

## 2022-01-06 LAB — CBC WITH DIFFERENTIAL/PLATELET
Abs Immature Granulocytes: 0.1 10*3/uL — ABNORMAL HIGH (ref 0.00–0.07)
Basophils Absolute: 0.1 10*3/uL (ref 0.0–0.1)
Basophils Relative: 3 %
Eosinophils Absolute: 0.2 10*3/uL (ref 0.0–0.5)
Eosinophils Relative: 9 %
HCT: 30.1 % — ABNORMAL LOW (ref 36.0–46.0)
Hemoglobin: 10.7 g/dL — ABNORMAL LOW (ref 12.0–15.0)
Immature Granulocytes: 4 %
Lymphocytes Relative: 34 %
Lymphs Abs: 0.9 10*3/uL (ref 0.7–4.0)
MCH: 38.4 pg — ABNORMAL HIGH (ref 26.0–34.0)
MCHC: 35.5 g/dL (ref 30.0–36.0)
MCV: 107.9 fL — ABNORMAL HIGH (ref 80.0–100.0)
Monocytes Absolute: 0.1 10*3/uL (ref 0.1–1.0)
Monocytes Relative: 5 %
Neutro Abs: 1.2 10*3/uL — ABNORMAL LOW (ref 1.7–7.7)
Neutrophils Relative %: 45 %
Platelets: 132 10*3/uL — ABNORMAL LOW (ref 150–400)
RBC: 2.79 MIL/uL — ABNORMAL LOW (ref 3.87–5.11)
RDW: 17.6 % — ABNORMAL HIGH (ref 11.5–15.5)
Smear Review: NORMAL
WBC: 2.6 10*3/uL — ABNORMAL LOW (ref 4.0–10.5)
nRBC: 0 % (ref 0.0–0.2)

## 2022-01-06 LAB — RETICULOCYTES
Immature Retic Fract: 8.9 % (ref 2.3–15.9)
RBC.: 2.8 MIL/uL — ABNORMAL LOW (ref 3.87–5.11)
Retic Count, Absolute: 55.2 10*3/uL (ref 19.0–186.0)
Retic Ct Pct: 2 % (ref 0.4–3.1)

## 2022-01-06 LAB — COMPREHENSIVE METABOLIC PANEL
ALT: 10 U/L (ref 0–44)
AST: 24 U/L (ref 15–41)
Albumin: 3.7 g/dL (ref 3.5–5.0)
Alkaline Phosphatase: 80 U/L (ref 38–126)
Anion gap: 4 — ABNORMAL LOW (ref 5–15)
BUN: 16 mg/dL (ref 8–23)
CO2: 27 mmol/L (ref 22–32)
Calcium: 8.1 mg/dL — ABNORMAL LOW (ref 8.9–10.3)
Chloride: 97 mmol/L — ABNORMAL LOW (ref 98–111)
Creatinine, Ser: 0.62 mg/dL (ref 0.44–1.00)
GFR, Estimated: >60 mL/min (ref >60)
Glucose, Bld: 99 mg/dL (ref 70–99)
Potassium: 4.3 mmol/L (ref 3.5–5.1)
Sodium: 128 mmol/L — ABNORMAL LOW (ref 135–145)
Total Bilirubin: 1.2 mg/dL (ref 0.3–1.2)
Total Protein: 6.3 g/dL — ABNORMAL LOW (ref 6.5–8.1)

## 2022-01-06 LAB — FERRITIN: Ferritin: 384 ng/mL — ABNORMAL HIGH (ref 11–307)

## 2022-01-06 LAB — FOLATE: Folate: >100 ng/mL (ref >5.9)

## 2022-01-06 LAB — TSH: TSH: 1.578 u[IU]/mL (ref 0.350–4.500)

## 2022-01-06 LAB — LACTATE DEHYDROGENASE: LDH: 113 U/L (ref 98–192)

## 2022-01-06 LAB — VITAMIN B12: Vitamin B-12: 760 pg/mL (ref 180–914)

## 2022-01-06 NOTE — Progress Notes (Signed)
? ?Hematology/Oncology Consult note ?[place] ?[phone] [phone] ? ?Patient Care Team: ?[name], MD as PCP - General (Internal Medicine) ?[name], MD (Internal Medicine) ?Robert Bellow, MD (General Surgery) ?Oneta Rack, MD (Dermatology) ?Leona Singleton, RN as Case Manager  ? ?Name of the patient: Kim Santiago  ?[id number]  ?[birth year]  ? ? ?Reason for referral-pancytopenia ?  ?Referring physician-Dr. Derrel Nip ? ?Date of visit: 01/06/22 ? ? ?History of presenting illness-patient is a 87 year old female with a past medical history significant for lupus on methotrexate, hypothyroidism, history of CVA among other medical problems.  She has been on longstanding methotrexate.She has been referred for pancytopenia.  Her white cell count has been fluctuating between 2.3-4 over the last 4 years.  Platelet count stable in the 120s and hemoglobin has been mostly between 10-12 dating back to December 2019.  In the past she has had mild macrocytosis with an MCV of around 100 but MCV has been getting progressively higher in the last 6 months.Differential has shown mild neutropenia with an [place] that fluctuates between 1.3-2.5. Recent iron studies from February showed elevated ferritin of 363 with a normal serum iron.  RBC folate normal at 993. ? ?Denies any recyrrent infections or hospitalziations ? ?ECOG PS- 2 ? ?Pain scale- 0 ? ? ?Review of systems- Review of Systems  ?Constitutional:  Positive for malaise/fatigue. Negative for chills, fever and weight loss.  ?HENT:  Negative for congestion, ear discharge and nosebleeds.   ?Eyes:  Negative for blurred vision.  ?Respiratory:  Negative for cough, hemoptysis, sputum production, shortness of breath and wheezing.   ?Cardiovascular:  Negative for chest pain, palpitations, orthopnea and claudication.  ?Gastrointestinal:  Negative for abdominal pain, blood in stool, constipation, diarrhea, heartburn, melena,  nausea and vomiting.  ?Genitourinary:  Negative for dysuria, flank pain, frequency, hematuria and urgency.  ?Musculoskeletal:  Negative for back pain, joint pain and myalgias.  ?Skin:  Negative for rash.  ?Neurological:  Negative for dizziness, tingling, focal weakness, seizures, weakness and headaches.  ?Endo/Heme/Allergies:  Does not bruise/bleed easily.  ?Psychiatric/Behavioral:  Negative for depression and suicidal ideas. The patient does not have insomnia.   ? ?Allergies  ?Allergen Reactions  ? Azathioprine Nausea And Vomiting  ?  Severe vomiting  ? Hydroxychloroquine Hives and Nausea And Vomiting  ? Mycophenolate Mofetil Nausea Only  ? Amoxicillin Rash  ?  Has patient had a PCN reaction causing immediate rash, facial/tongue/throat swelling, SOB or lightheadedness with hypotension: Unknown ?Has patient had a PCN reaction causing severe rash involving mucus membranes or skin necrosis: Unknown ?Has patient had a PCN reaction that required hospitalization: Unknown ?Has patient had a PCN reaction occurring within the last 10 years: Unknown ?If all of the above answers are "NO", then may proceed with Cephalosporin use. ?Tolerates ceftriaxone and Ancef  ? Codeine Nausea And Vomiting  ? Naprosyn [Naproxen] Swelling  ? Orudis [Ketoprofen] Hives  ? Sulfa Antibiotics Rash  ? Sulfathiazole Rash  ? ? ?Patient Active Problem List  ? Diagnosis Date Noted  ? Pancytopenia ([place]) 12/27/2021  ? Iron deficiency anemia 12/23/2021  ? Frequent epistaxis 12/23/2021  ? Chronic diastolic heart failure ([place]) 06/19/2021  ? COVID-19 virus infection 04/20/2021  ? Generalized weakness 11/23/2020  ? Vaccine reaction, initial encounter 08/20/2020  ? Other drug-induced neutropenia ([place]) 01/16/2020  ? Hemiplegia affecting dominant side, post-stroke ([place]) 01/16/2020  ? Does use hearing aid 07/14/2019  ? Hypothyroidism due to acquired atrophy of thyroid 04/08/2019  ?  Impaired balance as late effect of cerebrovascular accident 11/13/2018  ? History  of CVA with residual deficit 11/13/2018  ? Leukopenia 11/11/2018  ? Anxiety about health 07/05/2018  ? Mediastinal adenopathy 04/27/2018  ? Edema due to hypoalbuminemia 02/20/2018  ? Hospital discharge follow-up 02/20/2018  ? History of vertebral fracture 02/18/2018  ? GERD (gastroesophageal reflux disease) 10/31/2017  ? Hand joint pain 06/13/2017  ? Vaginal atrophy 05/11/2017  ? Venous insufficiency of both lower extremities 01/10/2017  ? Discoid lupus 06/26/2016  ? Anemia 05/24/2016  ? Recurrent UTI 04/29/2016  ? TMJ syndrome 03/31/2016  ? Hearing loss 03/12/2016  ? Osteopenia 12/05/2015  ? Malignant neoplasm of left female breast ([place]) 11/26/2015  ? Hx of thyroid cancer 06/12/2014  ? Adenomatous polyp 06/12/2014  ? Major depressive disorder, recurrent episode, moderate ([place]) 05/29/2014  ? Grief 04/08/2014  ? Sjogren's syndrome ([place]) 03/09/2014  ? Insomnia due to stress 03/06/2014  ? Hyperlipidemia 12/28/2012  ? Medicare annual wellness visit, subsequent 12/28/2012  ? Essential hypertension 10/02/2011  ? Iatrogenic hypothyroidism 10/02/2011  ? Purpura ([place]) 05/25/2011  ? ? ? ?Past Medical History:  ?Diagnosis Date  ? Arthritis   ? Breast cancer ([place]) 2017  ? left mastectomy done 11/2015  ? Breast cancer in female [facility]) 11/18/2015  ? Left: 3.9 cm tumor, T2, 1/2 sentinel nodes positive for macro metastatic disease, N1, 3 negative nodes in the axillary tail, ER+,PR+, Her 2 neu, low Mammoprint score  ? Cancer [facility])   ? thyroid takes levothyroxine  ? CAP (community acquired pneumonia) 05/05/2021  ? Chronic kidney disease   ? UTI  ? Genetic screening March 2017.  ? Mammoprint of left breast cancer: Low risk for recurrence.   ? History of heart attack 06/12/2014  ? Hypertension   ? hypothyroidism   ? secondary to thyroidectomy for thyroid ca  ? Hypothyroidism   ? Lupus ([place])   ? subcutaneous  ? Menopause 40s  ? natural, hot flashes and mood lability now gone, off prempro 7 months  ? Myocardial infarction [facility]) 2013  ?  Osteoporosis   ? Osteopenia  ? Rosacea   ? Sjoegren syndrome   ? Stress-induced cardiomyopathy September of 2013  ? EF 35%. Peak troponin was 1.8.  ? ? ? ?Past Surgical History:  ?Procedure Laterality Date  ? BACK SURGERY    ? BREAST BIOPSY Left 10/30/15  ? positive, done in Dr. Dwyane Luo office  ? CARDIAC CATHETERIZATION  05/2012  ? [place]. No significant CAD. Ejection fraction of 35% due to stress-induced cardiomyopathy.  ? CHOLECYSTECTOMY    ? COLONOSCOPY    ? DILATION AND CURETTAGE OF UTERUS    ? [place] SURGERY  Feb 2008  ? L1, Dr. Mauri Pole  ? LUMBAR DISC SURGERY    ? L4-L5  ? MASTECTOMY Left 11/18/2015  ? positive  ? SENTINEL NODE BIOPSY Left 11/18/2015  ? Procedure: SENTINEL NODE BIOPSY;  Surgeon: Robert Bellow, MD;  Location: [facility] ORS;  Service: General;  Laterality: Left;  ? SHOULDER ARTHROSCOPY  2004  ? Left, Dr. Jefm Bryant  ? SIMPLE MASTECTOMY WITH AXILLARY SENTINEL NODE BIOPSY Left 11/18/2015  ? Procedure: SIMPLE MASTECTOMY;  Surgeon: Robert Bellow, MD;  Location: [facility] ORS;  Service: General;  Laterality: Left;  ? SPINE SURGERY    ? L4-5 diskectomy  ? THYROIDECTOMY    ? Thyroid Cancer  ? TONSILLECTOMY    ? TUBAL LIGATION    ? ? ?Social History  ? ?Socioeconomic History  ? Marital status: Widowed  ?  Spouse name: Not on file  ? Number of children: 1  ? Years of education: college  ? Highest education level: Not on file  ?Occupational History  ? Not on file  ?Tobacco Use  ? Smoking status: Never  ? Smokeless tobacco: Never  ?Vaping Use  ? Vaping Use: Never used  ?Substance and Sexual Activity  ? Alcohol use: No  ? Drug use: No  ? Sexual activity: Never  ?Other Topics Concern  ? Not on file  ?Social History Narrative  ? Has 1 adopted daughter.  ? ?Social Determinants of Health  ? ?Financial Resource Strain: Low Risk   ? Difficulty of Paying Living Expenses: Not hard at all  ?Food Insecurity: No Food Insecurity  ? Worried About Charity fundraiser in the Last Year: Never true  ? Ran Out of Food in the Last  Year: Never true  ?Transportation Needs: No Transportation Needs  ? Lack of Transportation (Medical): No  ? Lack of Transportation (Non-Medical): No  ?Physical Activity: Insufficiently Active  ? Days of Exerc

## 2022-01-06 NOTE — Telephone Encounter (Signed)
Attempted to call pt- No answer/No voicemail.  

## 2022-01-06 NOTE — Progress Notes (Signed)
Pt here for pancytopenia, has hgb and plt and has lupus and purpura ?

## 2022-01-07 ENCOUNTER — Other Ambulatory Visit: Payer: Self-pay | Admitting: Internal Medicine

## 2022-01-07 DIAGNOSIS — H353131 Nonexudative age-related macular degeneration, bilateral, early dry stage: Secondary | ICD-10-CM | POA: Diagnosis not present

## 2022-01-07 DIAGNOSIS — H5203 Hypermetropia, bilateral: Secondary | ICD-10-CM | POA: Diagnosis not present

## 2022-01-07 DIAGNOSIS — E034 Atrophy of thyroid (acquired): Secondary | ICD-10-CM

## 2022-01-07 DIAGNOSIS — H52223 Regular astigmatism, bilateral: Secondary | ICD-10-CM | POA: Diagnosis not present

## 2022-01-07 DIAGNOSIS — M3500 Sicca syndrome, unspecified: Secondary | ICD-10-CM | POA: Diagnosis not present

## 2022-01-07 DIAGNOSIS — H524 Presbyopia: Secondary | ICD-10-CM | POA: Diagnosis not present

## 2022-01-07 DIAGNOSIS — Z961 Presence of intraocular lens: Secondary | ICD-10-CM | POA: Diagnosis not present

## 2022-01-07 LAB — KAPPA/LAMBDA LIGHT CHAINS
Kappa free light chain: 18.4 mg/L (ref 3.3–19.4)
Kappa, lambda light chain ratio: 1.08 (ref 0.26–1.65)
Lambda free light chains: 17 mg/L (ref 5.7–26.3)

## 2022-01-07 LAB — HAPTOGLOBIN: Haptoglobin: 129 mg/dL (ref 41–333)

## 2022-01-09 LAB — MULTIPLE MYELOMA PANEL, SERUM
Albumin SerPl Elph-Mcnc: 3.8 g/dL (ref 2.9–4.4)
Albumin/Glob SerPl: 2 — ABNORMAL HIGH (ref 0.7–1.7)
Alpha 1: 0.2 g/dL (ref 0.0–0.4)
Alpha2 Glob SerPl Elph-Mcnc: 0.5 g/dL (ref 0.4–1.0)
B-Globulin SerPl Elph-Mcnc: 0.7 g/dL (ref 0.7–1.3)
Gamma Glob SerPl Elph-Mcnc: 0.6 g/dL (ref 0.4–1.8)
Globulin, Total: 2 g/dL — ABNORMAL LOW (ref 2.2–3.9)
IgA: 122 mg/dL (ref 64–422)
IgG (Immunoglobin G), Serum: 760 mg/dL (ref 586–1602)
IgM (Immunoglobulin M), Srm: 31 mg/dL (ref 26–217)
Total Protein ELP: 5.8 g/dL — ABNORMAL LOW (ref 6.0–8.5)

## 2022-01-12 NOTE — Telephone Encounter (Signed)
Spoke with pt to let her know that I'm not sure who tired to reach her last week because I do not see anything in the chart. Pt gave a verbal understanding.  ?

## 2022-01-19 ENCOUNTER — Telehealth: Payer: Self-pay | Admitting: Internal Medicine

## 2022-01-19 MED ORDER — SERTRALINE HCL 25 MG PO TABS: 25.0000 mg | ORAL_TABLET | Freq: Every day | ORAL | 1 refills | Status: DC

## 2022-01-19 NOTE — Telephone Encounter (Signed)
Medication refilled and pt is aware.  

## 2022-01-19 NOTE — Telephone Encounter (Signed)
Pt called in requesting refill on medication (sertraline (ZOLOFT) 25 MG tablet).... Pt requesting callback ?

## 2022-01-23 ENCOUNTER — Inpatient Hospital Stay (HOSPITAL_BASED_OUTPATIENT_CLINIC_OR_DEPARTMENT_OTHER): Payer: PPO | Admitting: Oncology

## 2022-01-23 ENCOUNTER — Encounter: Payer: Self-pay | Admitting: Oncology

## 2022-01-23 DIAGNOSIS — D61818 Other pancytopenia: Secondary | ICD-10-CM | POA: Diagnosis not present

## 2022-01-25 NOTE — Progress Notes (Signed)
I connected with Kim Santiago on 01/25/22 at  2:15 PM EDT by video enabled telemedicine visit and verified that I am speaking with the correct person using two identifiers.   I discussed the limitations, risks, security and privacy concerns of performing an evaluation and management service by telemedicine and the availability of in-person appointments. I also discussed with the patient that there may be a patient responsible charge related to this service. The patient expressed understanding and agreed to proceed.  Other persons participating in the visit and their role in the encounter:  none  Patient's location:  home Provider's location:  work  Risk analyst Complaint: Discuss results of blood work  History of present illness: patient is a 87 year old female with a past medical history significant for lupus on methotrexate, hypothyroidism, history of CVA among other medical problems.  She has been on longstanding methotrexate.She has been referred for pancytopenia.  Her white cell count has been fluctuating between 2.3-4 over the last 4 years.  Platelet count stable in the 120s and hemoglobin has been mostly between 10-12 dating back to December 2019.  In the past she has had mild macrocytosis with an MCV of around 100 but MCV has been getting progressively higher in the last 6 months.Differential has shown mild neutropenia with an [place] that fluctuates between 1.3-2.5. Recent iron studies from February showed elevated ferritin of 363 with a normal serum iron.  RBC folate normal at 993.  Results of blood work from 01/06/2022 were as follows: CBC showed white cell count of 2.6 with an ANC of 1.2, H&H of 10.7/30.1 107.9 MCV and a platelet count of 132.  B12 and folate were normal.  Ferritin levels were elevated at 384.  Reticulocyte count slightly low at 2% for the degree of anemia.  Myeloma panel showed no M protein serum free light chain normal.  Haptoglobin and TSH normal.    Interval history patient does  not report any new complaints since her last visit   Review of Systems  Constitutional:  Positive for malaise/fatigue. Negative for chills, fever and weight loss.  HENT:  Negative for congestion, ear discharge and nosebleeds.   Eyes:  Negative for blurred vision.  Respiratory:  Negative for cough, hemoptysis, sputum production, shortness of breath and wheezing.   Cardiovascular:  Negative for chest pain, palpitations, orthopnea and claudication.  Gastrointestinal:  Negative for abdominal pain, blood in stool, constipation, diarrhea, heartburn, melena, nausea and vomiting.  Genitourinary:  Negative for dysuria, flank pain, frequency, hematuria and urgency.  Musculoskeletal:  Negative for back pain, joint pain and myalgias.  Skin:  Negative for rash.  Neurological:  Negative for dizziness, tingling, focal weakness, seizures, weakness and headaches.  Endo/Heme/Allergies:  Does not bruise/bleed easily.  Psychiatric/Behavioral:  Negative for depression and suicidal ideas. The patient does not have insomnia.    Allergies  Allergen Reactions   Azathioprine Nausea And Vomiting    Severe vomiting   Hydroxychloroquine Hives and Nausea And Vomiting   Mycophenolate Mofetil Nausea Only   Amoxicillin Rash    Has patient had a PCN reaction causing immediate rash, facial/tongue/throat swelling, SOB or lightheadedness with hypotension: Unknown Has patient had a PCN reaction causing severe rash involving mucus membranes or skin necrosis: Unknown Has patient had a PCN reaction that required hospitalization: Unknown Has patient had a PCN reaction occurring within the last 10 years: Unknown If all of the above answers are "NO", then may proceed with Cephalosporin use. Tolerates ceftriaxone and Ancef   Codeine Nausea And  Vomiting   Naprosyn [Naproxen] Swelling   Orudis [Ketoprofen] Hives   Sulfa Antibiotics Rash   Sulfathiazole Rash    Past Medical History:  Diagnosis Date   Arthritis    Breast  cancer ([place]) 2017   left mastectomy done 11/2015   Breast cancer in female [facility]) 11/18/2015   Left: 3.9 cm tumor, T2, 1/2 sentinel nodes positive for macro metastatic disease, N1, 3 negative nodes in the axillary tail, ER+,PR+, Her 2 neu, low Mammoprint score   Cancer ([facility])    thyroid takes levothyroxine   CAP (community acquired pneumonia) 05/05/2021   Chronic kidney disease    UTI   Genetic screening 11/2015   Mammoprint of left breast cancer: Low risk for recurrence.    History of heart attack 06/12/2014   Hypertension    hypothyroidism    secondary to thyroidectomy for thyroid ca   Hypothyroidism    Lupus (H[facility])    subcutaneous   Menopause 40s   natural, hot flashes and mood lability now gone, off prempro 7 months   Myocardial infarction [facility]) 2013   Osteoporosis    Osteopenia   Rosacea    Sjoegren syndrome    Stress-induced cardiomyopathy September of 2013   EF 35%. Peak troponin was 1.8.   Stroke [facility]) 10/2018    Past Surgical History:  Procedure Laterality Date   BACK SURGERY     BREAST BIOPSY Left 10/30/15   positive, done in Dr. Dwyane Luo office   CARDIAC CATHETERIZATION  05/2012   [place]. No significant CAD. Ejection fraction of 35% due to stress-induced cardiomyopathy.   CHOLECYSTECTOMY     COLONOSCOPY     DILATION AND CURETTAGE OF UTERUS     KYPHOS[place] SURGERY  Feb 2008   L1, Dr. Mauri Pole   LUMBAR DISC SURGERY     L4-L5   MASTECTOMY Left 11/18/2015   positive   SENTINEL NODE BIOPSY Left 11/18/2015   Procedure: SENTINEL NODE BIOPSY;  Surgeon: Robert Bellow, MD;  Location: [facility] ORS;  Service: General;  Laterality: Left;   SHOULDER ARTHROSCOPY  2004   Left, Dr. Jefm Bryant   SIMPLE MASTECTOMY WITH AXILLARY SENTINEL NODE BIOPSY Left 11/18/2015   Procedure: SIMPLE MASTECTOMY;  Surgeon: Robert Bellow, MD;  Location: [facility] ORS;  Service: General;  Laterality: Left;   SPINE SURGERY     L4-5 diskectomy   THYROIDECTOMY     Thyroid Cancer   TONSILLECTOMY     TUBAL  LIGATION      Social History   Socioeconomic History   Marital status: Widowed    Spouse name: Not on file   Number of children: 1   Years of education: college   Highest education level: Not on file  Occupational History   Not on file  Tobacco Use   Smoking status: Never   Smokeless tobacco: Never  Vaping Use   Vaping Use: Never used  Substance and Sexual Activity   Alcohol use: No   Drug use: No   Sexual activity: Not Currently  Other Topics Concern   Not on file  Social History Narrative   Has 1 adopted daughter.   Social Determinants of Health   Financial Resource Strain: Low Risk    Difficulty of Paying Living Expenses: Not hard at all  Food Insecurity: No Food Insecurity   Worried About Charity fundraiser in the Last Year: Never true   Ran Out of Food in the Last Year: Never true  Transportation Needs: No Transportation Needs  Lack of Transportation (Medical): No   Lack of Transportation (Non-Medical): No  Physical Activity: Insufficiently Active   Days of Exercise per Week: 3 days   Minutes of Exercise per Session: 30 min  Stress: No Stress Concern Present   Feeling of Stress : Not at all  Social Connections: Unknown   Frequency of Communication with Friends and Family: More than three times a week   Frequency of Social Gatherings with Friends and Family: More than three times a week   Attends Religious Services: Not on Electrical engineer or Organizations: Not on file   Attends Archivist Meetings: Not on file   Marital Status: Not on file  Intimate Partner Violence: Not At Risk   Fear of Current or Ex-Partner: No   Emotionally Abused: No   Physically Abused: No   Sexually Abused: No    Family History  Problem Relation Age of Onset   Kidney disease Mother    Heart disease Mother    COPD Father    Cancer Father        esophageal   Kidney disease Sister    Cancer Brother 37       colon cancer (both brothers)   Cancer  Brother    Heart attack Brother 60   Heart disease Brother    Breast cancer Neg Hx      Current Outpatient Medications:    atorvastatin (LIPITOR) 20 MG tablet, TAKE 1 TABLET BY MOUTH DAILY AT 6PM, Disp: 90 tablet, Rfl: 1   Calcium Carbonate-Vitamin D 600-200 MG-UNIT TABS, Take 2 tablets by mouth daily., Disp: , Rfl:    cholecalciferol (VITAMIN D) 1000 UNITS tablet, Take 2,000 Units by mouth daily., Disp: , Rfl:    CRANBERRY PO, Take 1 capsule by mouth 2 (two) times daily., Disp: , Rfl:    ferrous gluconate (FERGON) 324 MG tablet, Take 1 tablet (324 mg total) by mouth daily with breakfast., Disp: 30 tablet, Rfl: 3   folic acid (FOLVITE) 1 MG tablet, Take 2 mg by mouth daily., Disp: , Rfl:    furosemide (LASIX) 20 MG tablet, 1 TABLET EVERY OTHER DAY AS NEEDED, Disp: 45 tablet, Rfl: 2   levothyroxine (SYNTHROID) 75 MCG tablet, TAKE 1 TABLET BY MOUTH (75MCG TOTAL) DAILY, Disp: 90 tablet, Rfl: 2   MELATONIN PO, Take 1 tablet by mouth every evening., Disp: , Rfl:    methotrexate (RHEUMATREX) 2.5 MG tablet, Take 15 mg by mouth once a week., Disp: , Rfl:    metolazone (ZAROXOLYN) 2.5 MG tablet, Take 2.5 mg by mouth every other day., Disp: , Rfl:    metoprolol succinate (TOPROL-XL) 25 MG 24 hr tablet, TAKE 1 TABLET BY MOUTH EVERY DAY, Disp: 90 tablet, Rfl: 3   polyethylene glycol (MIRALAX / GLYCOLAX) packet, Take 17 g by mouth daily., Disp: , Rfl:    predniSONE (DELTASONE) 10 MG tablet, Take by mouth., Disp: , Rfl:    Probiotic Product (PROBIOTIC PO), Take 1 tablet by mouth daily., Disp: , Rfl:    sertraline (ZOLOFT) 25 MG tablet, Take 1 tablet (25 mg total) by mouth daily., Disp: 90 tablet, Rfl: 1   sodium fluoride (PREVIDENT 5000 PLUS) 1.1 % CREA dental cream, Take 1 application by mouth daily. , Disp: , Rfl:    spironolactone (ALDACTONE) 25 MG tablet, Take 1 tablet by mouth every other day., Disp: , Rfl:    triamcinolone cream (KENALOG) 0.1 %, APPLY TO AFFECTED AREA TWICE A DAY, Disp: ,  Rfl:     nitroGLYCERIN (NITROSTAT) 0.4 MG SL tablet, Place 1 tablet (0.4 mg total) under the tongue every 5 (five) minutes as needed for chest pain. Maximum 3 doses (Patient not taking: Reported on 01/23/2022), Disp: 50 tablet, Rfl: 3   omeprazole (PRILOSEC) 20 MG capsule, TAKE 1 CAPSULE (20 MG TOTAL) BY MOUTH 2 (TWO) TIMES DAILY BEFORE A MEAL. (Patient not taking: Reported on 01/23/2022), Disp: 180 capsule, Rfl: 1   tizanidine (ZANAFLEX) 2 MG capsule, Take 2 mg by mouth 3 (three) times daily as needed for muscle spasms. (Patient not taking: Reported on 01/23/2022), Disp: , Rfl:   No results found.  No images are attached to the encounter.      Latest Ref Rng & Units 01/06/2022    2:27 PM  CMP  Glucose 70 - 99 mg/dL 99    BUN 8 - 23 mg/dL 16    Creatinine 0.44 - 1.00 mg/dL 0.62    Sodium 135 - 145 mmol/L 128    Potassium 3.5 - 5.1 mmol/L 4.3    Chloride 98 - 111 mmol/L 97    CO2 22 - 32 mmol/L 27    Calcium 8.9 - 10.3 mg/dL 8.1    Total Protein 6.5 - 8.1 g/dL 6.3    Total Bilirubin 0.3 - 1.2 mg/dL 1.2    Alkaline Phos 38 - 126 U/L 80    AST 15 - 41 U/L 24    ALT 0 - 44 U/L 10        Latest Ref Rng & Units 01/06/2022    2:27 PM  CBC  WBC 4.0 - 10.5 K/uL 2.6    Hemoglobin 12.0 - 15.0 g/dL 10.7    Hematocrit 36.0 - 46.0 % 30.1    Platelets 150 - 400 K/uL 132       Observation/objective: Appears in no acute distress over video visit today.  Breathing is nonlabored  Assessment and plan: Patient is a 87 year old female referred for pancytopenia.  She has a history of lupus and is on methotrexate  Patient has had mild chronic pancytopenia with a white cell count that fluctuates between 2.6-3.5 at least dating back to 2019.  Hemoglobin has remained stable between 10-11 platelet counts have been fluctuating between 1 32-1 80.  She has had chronic macrocytosis as well although recently in the last 3 to 4 months macrocytosis appears to be increasing.  Results of peripheral blood anemia work-up have  been negative.  Given the stability of her counts I am inclined to monitor this conservatively without the need for bone marrow biopsy.  Pancytopenia could be secondary to methotrexate versus autoimmune versus a primary bone marrow disorder.  If her cytopenias worsen with time I will consider doing a bone marrow biopsy at that time.  We will repeat CBC with differential in 3 months in 6 months and I will see her back in 6 months    Follow-up instructions: As above  I discussed the assessment and treatment plan with the patient. The patient was provided an opportunity to ask questions and all were answered. The patient agreed with the plan and demonstrated an understanding of the instructions.   The patient was advised to call back or seek an in-person evaluation if the symptoms worsen or if the condition fails to improve as anticipated.  Visit Diagnosis: 1. Pancytopenia ([place])     Dr. Randa Evens, MD, MPH [facility] Tel- [phone] 01/25/2022 6:49 PM

## 2022-01-29 DIAGNOSIS — I10 Essential (primary) hypertension: Secondary | ICD-10-CM | POA: Diagnosis not present

## 2022-01-29 DIAGNOSIS — Z515 Encounter for palliative care: Secondary | ICD-10-CM | POA: Diagnosis not present

## 2022-01-29 DIAGNOSIS — M329 Systemic lupus erythematosus, unspecified: Secondary | ICD-10-CM | POA: Diagnosis not present

## 2022-01-30 NOTE — Telephone Encounter (Signed)
Signing encounter, see note 10/10/20

## 2022-02-05 DIAGNOSIS — Z79899 Other long term (current) drug therapy: Secondary | ICD-10-CM | POA: Diagnosis not present

## 2022-02-05 DIAGNOSIS — L931 Subacute cutaneous lupus erythematosus: Secondary | ICD-10-CM | POA: Diagnosis not present

## 2022-02-05 DIAGNOSIS — L439 Lichen planus, unspecified: Secondary | ICD-10-CM | POA: Diagnosis not present

## 2022-02-06 ENCOUNTER — Ambulatory Visit (INDEPENDENT_AMBULATORY_CARE_PROVIDER_SITE_OTHER): Payer: PPO | Admitting: *Deleted

## 2022-02-06 DIAGNOSIS — I10 Essential (primary) hypertension: Secondary | ICD-10-CM

## 2022-02-06 DIAGNOSIS — I5042 Chronic combined systolic (congestive) and diastolic (congestive) heart failure: Secondary | ICD-10-CM

## 2022-02-06 NOTE — Patient Instructions (Addendum)
Visit Information  Thank you for taking time to visit with me today. Please don't hesitate to contact me if I can be of assistance to you before our next scheduled telephone appointment.  Following are the goals we discussed today:  Patient will self administer medications as prescribed as evidenced by self report/primary caregiver report  Patient will attend all scheduled provider appointments as evidenced by clinician review of documented attendance to scheduled appointments and patient/caregiver report Call office if I gain more than 2 pounds in one day or 5 pounds in one week Keep legs up while sitting Use salt in moderation Watch for swelling in feet, ankles and legs every day Weigh myself daily writing in log Check blood pressures at least once a week keeping log for provider review Fall precautions and preventions  Our next appointment is by telephone on 7/10 at 1000  Please call the care guide team at [phone] if you need to cancel or reschedule your appointment.   If you are experiencing a Mental Health or [place] or need someone to talk to, please call the Suicide and Crisis Lifeline: 988 call the [place] National Suicide Prevention Lifeline: 367-554-2500 or TTY: 951-517-3227 TTY 9397301155) to talk to a trained counselor call 1-800-273-TALK (toll free, 24 hour hotline) call 911   Patient verbalizes understanding of instructions and care plan provided today and agrees to view in [place]. Active MyChart status and patient understanding of how to access instructions and care plan via MyChart confirmed with patient.     Hubert Azure RN, MSN RN Care Management Coordinator [place] [phone] [email or web address]

## 2022-02-06 NOTE — Chronic Care Management (AMB) (Signed)
Chronic Care Management   CCM RN Visit Note  02/06/2022 Name: Kim Santiago MRN: [id number] DOB: 1934/12/16  Subjective: Kim Santiago is a 87 y.o. year old female who is a primary care patient of [name], MD. The care management team was consulted for assistance with disease management and care coordination needs.    Engaged with patient by telephone for follow up visit in response to provider referral for case management and/or care coordination services.   Consent to Services:  The patient was given information about Chronic Care Management services, agreed to services, and gave verbal consent prior to initiation of services.  Please see initial visit note for detailed documentation.   Patient agreed to services and verbal consent obtained.   Assessment: Review of patient past medical history, allergies, medications, health status, including review of consultants reports, laboratory and other test data, was performed as part of comprehensive evaluation and provision of chronic care management services.   SDOH (Social Determinants of Health) assessments and interventions performed:    CCM Care Plan  Allergies  Allergen Reactions   Azathioprine Nausea And Vomiting    Severe vomiting   Hydroxychloroquine Hives and Nausea And Vomiting   Mycophenolate Mofetil Nausea Only   Amoxicillin Rash    Has patient had a PCN reaction causing immediate rash, facial/tongue/throat swelling, SOB or lightheadedness with hypotension: Unknown Has patient had a PCN reaction causing severe rash involving mucus membranes or skin necrosis: Unknown Has patient had a PCN reaction that required hospitalization: Unknown Has patient had a PCN reaction occurring within the last 10 years: Unknown If all of the above answers are "NO", then may proceed with Cephalosporin use. Tolerates ceftriaxone and Ancef   Codeine Nausea And Vomiting   Naprosyn [Naproxen] Swelling   Orudis [Ketoprofen] Hives    Sulfa Antibiotics Rash   Sulfathiazole Rash    Outpatient Encounter Medications as of 02/06/2022  Medication Sig Note   MELATONIN PO Take 1 tablet by mouth every evening.    methotrexate (RHEUMATREX) 2.5 MG tablet Take 15 mg by mouth once a week.    metoprolol succinate (TOPROL-XL) 25 MG 24 hr tablet TAKE 1 TABLET BY MOUTH EVERY DAY    predniSONE (DELTASONE) 10 MG tablet Take by mouth.    atorvastatin (LIPITOR) 20 MG tablet TAKE 1 TABLET BY MOUTH DAILY AT 6PM    Calcium Carbonate-Vitamin D 600-200 MG-UNIT TABS Take 2 tablets by mouth daily.    cholecalciferol (VITAMIN D) 1000 UNITS tablet Take 2,000 Units by mouth daily.    CRANBERRY PO Take 1 capsule by mouth 2 (two) times daily.    ferrous gluconate (FERGON) 324 MG tablet Take 1 tablet (324 mg total) by mouth daily with breakfast.    folic acid (FOLVITE) 1 MG tablet Take 2 mg by mouth daily. 01/23/2022: Pt states $RemoveB'3mg'HGffnYVE$  a day.   furosemide (LASIX) 20 MG tablet 1 TABLET EVERY OTHER DAY AS NEEDED    levothyroxine (SYNTHROID) 75 MCG tablet TAKE 1 TABLET BY MOUTH (75MCG TOTAL) DAILY    metolazone (ZAROXOLYN) 2.5 MG tablet Take 2.5 mg by mouth every other day.    nitroGLYCERIN (NITROSTAT) 0.4 MG SL tablet Place 1 tablet (0.4 mg total) under the tongue every 5 (five) minutes as needed for chest pain. Maximum 3 doses    omeprazole (PRILOSEC) 20 MG capsule TAKE 1 CAPSULE (20 MG TOTAL) BY MOUTH 2 (TWO) TIMES DAILY BEFORE A MEAL. (Patient not taking: Reported on 01/23/2022)    polyethylene glycol (MIRALAX /  GLYCOLAX) packet Take 17 g by mouth daily.    Probiotic Product (PROBIOTIC PO) Take 1 tablet by mouth daily.    sertraline (ZOLOFT) 25 MG tablet Take 1 tablet (25 mg total) by mouth daily.    sodium fluoride (PREVIDENT 5000 PLUS) 1.1 % CREA dental cream Take 1 application by mouth daily.     spironolactone (ALDACTONE) 25 MG tablet Take 1 tablet by mouth every other day. 01/23/2022: PT STATES PRN.    tizanidine (ZANAFLEX) 2 MG capsule Take 2 mg by mouth  3 (three) times daily as needed for muscle spasms. (Patient not taking: Reported on 01/23/2022)    triamcinolone cream (KENALOG) 0.1 % APPLY TO AFFECTED AREA TWICE A DAY    No facility-administered encounter medications on file as of 02/06/2022.    Patient Active Problem List   Diagnosis Date Noted   Pancytopenia ([place]) 12/27/2021   Iron deficiency anemia 12/23/2021   Frequent epistaxis 12/23/2021   Chronic diastolic heart failure ([place]) 06/19/2021   COVID-19 virus infection 04/20/2021   Generalized weakness 11/23/2020   Vaccine reaction, initial encounter 08/20/2020   Other drug-induced neutropenia ([place]) 01/16/2020   Hemiplegia affecting dominant side, post-stroke ([place]) 01/16/2020   Does use hearing aid 07/14/2019   Hypothyroidism due to acquired atrophy of thyroid 04/08/2019   Impaired balance as late effect of cerebrovascular accident 11/13/2018   History of CVA with residual deficit 11/13/2018   Leukopenia 11/11/2018   Anxiety about health 07/05/2018   Mediastinal adenopathy 04/27/2018   Edema due to hypoalbuminemia 02/20/2018   Hospital discharge follow-up 02/20/2018   History of vertebral fracture 02/18/2018   GERD (gastroesophageal reflux disease) 10/31/2017   Hand joint pain 06/13/2017   Vaginal atrophy 05/11/2017   Venous insufficiency of both lower extremities 01/10/2017   Discoid lupus 06/26/2016   Anemia 05/24/2016   Recurrent UTI 04/29/2016   TMJ syndrome 03/31/2016   Hearing loss 03/12/2016   Osteopenia 12/05/2015   Malignant neoplasm of left female breast ([place]) 11/26/2015   Hx of thyroid cancer 06/12/2014   Adenomatous polyp 06/12/2014   Major depressive disorder, recurrent episode, moderate ([place]) 05/29/2014   Grief 04/08/2014   Sjogren's syndrome ([place]) 03/09/2014   Insomnia due to stress 03/06/2014   Hyperlipidemia 12/28/2012   Medicare annual wellness visit, subsequent 12/28/2012   Essential hypertension 10/02/2011   Iatrogenic hypothyroidism 10/02/2011    Purpura ([place]) 05/25/2011    Conditions to be addressed/monitored:CHF and HTN  Care Plan : CCM RNCM [facility]  Updates made by Leona Singleton, RN since 02/06/2022 12:00 AM     Problem: Knowledge deficiet related to management of CHF, HTN   Priority: Medium     Long-Range Goal: Patient will work with CM team to gain knowledge of self health care knowledge and management   Start Date: 12/01/2021  Expected End Date: 03/08/2023  Recent Progress: On track  Priority: Medium  Note:   Current Barriers:  Knowledge Deficits related to plan of care for management of CHF and HTN   reports weighing daily, weight 137 pounds.  Monitors blood pressures, last 120-140/80'S.   Denies any chest pain or shortness of breath.  Some swelling during the day that resolves at night.  Does report continued Lupus flare and no longer taking weekly prednisone.  Has completed HHPT, denies any recent falls.  Awaiting hematology consult. 6/2--reports doing fair.  continues with lupus flare and on methotrexate and prednisone.  bps  have ranged 120-130/80's.138 pounds.  denies any chest pain, sob, falls, or  dizziness  RNCM Clinical Goal(s):  Patient will verbalize understanding of plan for management of CHF and HTN  verbalize basic understanding of CHF and HTN disease process and self health management plan as previous take all medications exactly as prescribed and will call provider for medication related questions through collaboration with RN Care manager, provider, and care team.   Interventions: 1:1 collaboration with primary care provider regarding development and update of comprehensive plan of care as evidenced by provider attestation and co-signature Inter-disciplinary care team collaboration (see longitudinal plan of care) Evaluation of current treatment plan related to  self management and patient's adherence to plan as established by provider  Heart Failure Interventions:  (Status: Goal on Track  (progressing): YES.)   Long Term Goal Basic overview and discussion of pathophysiology of Heart Failure reviewed Provided education on low sodium diet Reviewed Heart Failure Action Plan in depth and provided written copy Assessed need for readable accurate scales in home Provided education about placing scale on hard, flat surface Advised patient to weigh each morning after emptying bladder Discussed importance of daily weight and advised patient to weigh and record daily Reviewed role of diuretics in prevention of fluid overload and management of heart failure Discussed the importance of keeping all appointments with provider  Hypertension: (Status: New goal. Goal on Track (progressing): YES.)   Long Term Goal Last practice recorded BP readings:  BP Readings from Last 3 Encounters:  01/06/22 116/78  10/21/21 (!) 142/68  08/27/21 126/62  Most recent eGFR/CrCl: No results found for: EGFR  No components found for: CRCL Evaluation of current treatment plan related to hypertension self management and patient's adherence to plan as established by provider;   Provided education to patient re: stroke prevention, s/s of heart attack and stroke; Reviewed prescribed diet low salt Reviewed medications with patient and discussed importance of compliance;  Counseled on adverse effects of illicit drug and excessive alcohol use in patients with high blood pressure;  Counseled on the importance of exercise goals with target of 150 minutes per week Discussed plans with patient for ongoing care management follow up and provided patient with direct contact information for care management team; Advised patient, providing education and rationale, to monitor blood pressure daily and record, calling PCP for findings outside established parameters;  Encouraged to continue to do exercises taught by therapist  Patient Goals/Self-Care Activities: Patient will self administer medications as prescribed as evidenced  by self report/primary caregiver report  Patient will attend all scheduled provider appointments as evidenced by clinician review of documented attendance to scheduled appointments and patient/caregiver report Call office if I gain more than 2 pounds in one day or 5 pounds in one week Keep legs up while sitting Use salt in moderation Watch for swelling in feet, ankles and legs every day Weigh myself daily writing in log Check blood pressures at least once a week keeping log for provider review Fall precautions and preventions       Plan:The care management team will reach out to the patient again over the next 45 days.  Hubert Azure RN, MSN RN Care Management Coordinator [place] [phone] [email or web address]

## 2022-02-17 ENCOUNTER — Other Ambulatory Visit: Payer: Self-pay | Admitting: *Deleted

## 2022-02-17 NOTE — Progress Notes (Deleted)
New letter due to the [facility] was wrong number. The letter was faxed and it went through

## 2022-02-28 ENCOUNTER — Other Ambulatory Visit: Payer: Self-pay | Admitting: Internal Medicine

## 2022-03-06 DIAGNOSIS — I110 Hypertensive heart disease with heart failure: Secondary | ICD-10-CM

## 2022-03-06 DIAGNOSIS — I509 Heart failure, unspecified: Secondary | ICD-10-CM | POA: Diagnosis not present

## 2022-03-16 ENCOUNTER — Ambulatory Visit (INDEPENDENT_AMBULATORY_CARE_PROVIDER_SITE_OTHER): Payer: PPO | Admitting: *Deleted

## 2022-03-16 DIAGNOSIS — I10 Essential (primary) hypertension: Secondary | ICD-10-CM

## 2022-03-16 DIAGNOSIS — I5042 Chronic combined systolic (congestive) and diastolic (congestive) heart failure: Secondary | ICD-10-CM

## 2022-03-16 NOTE — Chronic Care Management (AMB) (Signed)
  Care Management   Follow Up Note   03/16/2022 Name: Kim Santiago MRN: [id number] DOB: May 05, 1935   Referred by: [name], MD Reason for referral : Case Closure   Successful outreach to patient.  States she is doing well without complaints.  Discussed goals and both agree patient has met goals of the program.  Follow Up Plan: The patient has been provided with contact information for the care management team and has been advised to call with any health-related questions or concerns.  No further follow up required: as personal goals have been met.  Hubert Azure RN, MSN RN Care Management Coordinator [place] [phone] [email or web address]

## 2022-03-16 NOTE — Patient Instructions (Signed)
CONGRATULATIONS ON COMPLETING YOUR GOALS.  IT AS BEEN A PLEASURE WORKING WITH AND TALKING TO YOU.  IF  NEEDS [place] IN THE FUTURE PLEASE DO NOT HESITATE TO CONTACT ME  [phone]  Kallin Henk RN, MSN RN Care Management Coordinator [facility] [phone] [email or web address]  

## 2022-03-23 ENCOUNTER — Other Ambulatory Visit: Payer: Self-pay | Admitting: Internal Medicine

## 2022-04-06 DIAGNOSIS — I10 Essential (primary) hypertension: Secondary | ICD-10-CM

## 2022-04-06 DIAGNOSIS — I5042 Chronic combined systolic (congestive) and diastolic (congestive) heart failure: Secondary | ICD-10-CM | POA: Diagnosis not present

## 2022-04-09 DIAGNOSIS — L931 Subacute cutaneous lupus erythematosus: Secondary | ICD-10-CM | POA: Diagnosis not present

## 2022-04-09 DIAGNOSIS — D692 Other nonthrombocytopenic purpura: Secondary | ICD-10-CM | POA: Diagnosis not present

## 2022-04-09 DIAGNOSIS — Z79899 Other long term (current) drug therapy: Secondary | ICD-10-CM | POA: Diagnosis not present

## 2022-04-09 DIAGNOSIS — L719 Rosacea, unspecified: Secondary | ICD-10-CM | POA: Diagnosis not present

## 2022-04-13 ENCOUNTER — Ambulatory Visit (INDEPENDENT_AMBULATORY_CARE_PROVIDER_SITE_OTHER): Payer: PPO

## 2022-04-13 VITALS — Ht 62.0 in | Wt 142.0 lb

## 2022-04-13 DIAGNOSIS — Z0000 Encounter for general adult medical examination without abnormal findings: Secondary | ICD-10-CM | POA: Diagnosis not present

## 2022-04-13 NOTE — Patient Instructions (Addendum)
  Kim Santiago , Thank you for taking time to come for your Medicare Wellness Visit. I appreciate your ongoing commitment to your health goals. Please review the following plan we discussed and let me know if I can assist you in the future.   These are the goals we discussed:  Goals       Patient Stated     Increase physical activity (pt-stated)      Walk more for exercise        This is a list of the screening recommended for you and due dates:  Health Maintenance  Topic Date Due   Flu Shot  04/07/2022   COVID-19 Vaccine (4 - Booster) 04/29/2022*   Zoster (Shingles) Vaccine (1 of 2) 07/14/2022*   Tetanus Vaccine  02/21/2023   Pneumonia Vaccine  Completed   DEXA scan (bone density measurement)  Completed   HPV Vaccine  Aged Out  *Topic was postponed. The date shown is not the original due date.

## 2022-04-13 NOTE — Progress Notes (Addendum)
Subjective:   Kim Santiago is a 87 y.o. female who presents for Medicare Annual (Subsequent) preventive examination.  Review of Systems    No ROS.  Medicare Wellness Virtual Visit.  Visual/audio telehealth visit, UTA vital signs.   See social history for additional risk factors.   Cardiac Risk Factors include: advanced age (>25mn, >>8women)     Objective:    Today's Vitals   04/13/22 1044  Weight: 142 lb (64.4 kg)  Height: '5\' 2"'$  (1.575 m)   Body mass index is 25.97 kg/m.     04/13/2022   10:42 AM 01/23/2022    1:19 PM 01/06/2022    1:32 PM 01/06/2022   12:35 PM 06/30/2021    2:57 PM 05/06/2021    6:00 PM 04/10/2021    9:54 AM  Advanced Directives  Does Patient Have a Medical Advance Directive? Yes Yes Yes Yes Yes Yes Yes  Type of AParamedicof AAmelia Court HouseLiving will Living will;Healthcare Power of ASan MarcosLiving will HSteubenvilleLiving will HThree RiversLiving will;Out of facility DNR (pink MOST or yellow form) Out of facility DNR (pink MOST or yellow form);Healthcare Power of ASt. RoseLiving will  Does patient want to make changes to medical advance directive? No - Patient declined   No - Patient declined No - Patient declined No - Patient declined No - Patient declined  Copy of HSeldoviain Chart? Yes - validated most recent copy scanned in chart (See row information)  Yes - validated most recent copy scanned in chart (See row information)  Yes - validated most recent copy scanned in chart (See row information) No - copy requested Yes - validated most recent copy scanned in chart (See row information)  Would patient like information on creating a medical advance directive?  No - Patient declined No - Patient declined      Pre-existing out of facility DNR order (yellow form or pink MOST form)     Yellow form placed in chart (order not valid for  inpatient use) Physician notified to receive inpatient order     Current Medications (verified) Outpatient Encounter Medications as of 04/13/2022  Medication Sig   atorvastatin (LIPITOR) 20 MG tablet TAKE 1 TABLET BY MOUTH EVERY DAY AT 6 PM   Calcium Carbonate-Vitamin D 600-200 MG-UNIT TABS Take 2 tablets by mouth daily.   cholecalciferol (VITAMIN D) 1000 UNITS tablet Take 2,000 Units by mouth daily.   CRANBERRY PO Take 1 capsule by mouth 2 (two) times daily.   ferrous gluconate (FERGON) 324 MG tablet Take 1 tablet (324 mg total) by mouth daily with breakfast.   folic acid (FOLVITE) 1 MG tablet Take 2 mg by mouth daily.   furosemide (LASIX) 20 MG tablet 1 TABLET EVERY OTHER DAY AS NEEDED   levothyroxine (SYNTHROID) 75 MCG tablet TAKE 1 TABLET BY MOUTH (75MCG TOTAL) DAILY   MELATONIN PO Take 1 tablet by mouth every evening.   methotrexate (RHEUMATREX) 2.5 MG tablet Take 15 mg by mouth once a week.   metolazone (ZAROXOLYN) 2.5 MG tablet Take 2.5 mg by mouth every other day.   metoprolol succinate (TOPROL-XL) 25 MG 24 hr tablet TAKE 1 TABLET BY MOUTH EVERY DAY   nitroGLYCERIN (NITROSTAT) 0.4 MG SL tablet Place 1 tablet (0.4 mg total) under the tongue every 5 (five) minutes as needed for chest pain. Maximum 3 doses   omeprazole (PRILOSEC) 20 MG capsule TAKE 1 CAPSULE (  20 MG TOTAL) BY MOUTH 2 (TWO) TIMES DAILY BEFORE A MEAL.   polyethylene glycol (MIRALAX / GLYCOLAX) packet Take 17 g by mouth daily.   Probiotic Product (PROBIOTIC PO) Take 1 tablet by mouth daily.   sertraline (ZOLOFT) 25 MG tablet Take 1 tablet (25 mg total) by mouth daily.   sodium fluoride (PREVIDENT 5000 PLUS) 1.1 % CREA dental cream Take 1 application by mouth daily.    spironolactone (ALDACTONE) 25 MG tablet Take 1 tablet by mouth every other day.   tizanidine (ZANAFLEX) 2 MG capsule Take 2 mg by mouth 3 (three) times daily as needed for muscle spasms. (Patient not taking: Reported on 01/23/2022)   triamcinolone cream  (KENALOG) 0.1 % APPLY TO AFFECTED AREA TWICE A DAY   No facility-administered encounter medications on file as of 04/13/2022.    Allergies (verified) Azathioprine, Hydroxychloroquine, Mycophenolate mofetil, Amoxicillin, Codeine, Naprosyn [naproxen], Orudis [ketoprofen], Sulfa antibiotics, and Sulfathiazole   History: Past Medical History:  Diagnosis Date   Arthritis    Breast cancer ([place]) 2017   left mastectomy done 11/2015   Breast cancer in female [facility]) 11/18/2015   Left: 3.9 cm tumor, T2, 1/2 sentinel nodes positive for macro metastatic disease, N1, 3 negative nodes in the axillary tail, ER+,PR+, Her 2 neu, low Mammoprint score   Cancer (HCC)    thyroid takes levothyroxine   CAP (community acquired pneumonia) 05/05/2021   Chronic kidney disease    UTI   Genetic screening 11/2015   Mammoprint of left breast cancer: Low risk for recurrence.    History of heart attack 06/12/2014   Hypertension    hypothyroidism    secondary to thyroidectomy for thyroid ca   Hypothyroidism    Lupus (HCC)    subcutaneous   Menopause 40s   natural, hot flashes and mood lability now gone, off prempro 7 months   Myocardial infarction [facility]) 2013   Osteoporosis    Osteopenia   Rosacea    Sjoegren syndrome    Stress-induced cardiomyopathy September of 2013   EF 35%. Peak troponin was 1.8.   Stroke [facility]) 10/2018   Past Surgical History:  Procedure Laterality Date   BACK SURGERY     BREAST BIOPSY Left 10/30/15   positive, done in Dr. Dwyane Luo office   CARDIAC CATHETERIZATION  05/2012   [place]. No significant CAD. Ejection fraction of 35% due to stress-induced cardiomyopathy.   CHOLECYSTECTOMY     COLONOSCOPY     DILATION AND CURETTAGE OF UTERUS     KYPHOSIS SURGERY  Feb 2008   L1, Dr. Mauri Pole   LUMBAR DISC SURGERY     L4-L5   MASTECTOMY Left 11/18/2015   positive   SENTINEL NODE BIOPSY Left 11/18/2015   Procedure: SENTINEL NODE BIOPSY;  Surgeon: Robert Bellow, MD;  Location: [facility] ORS;   Service: General;  Laterality: Left;   SHOULDER ARTHROSCOPY  2004   Left, Dr. Jefm Bryant   SIMPLE MASTECTOMY WITH AXILLARY SENTINEL NODE BIOPSY Left 11/18/2015   Procedure: SIMPLE MASTECTOMY;  Surgeon: Robert Bellow, MD;  Location: [facility] ORS;  Service: General;  Laterality: Left;   SPINE SURGERY     L4-5 diskectomy   THYROIDECTOMY     Thyroid Cancer   TONSILLECTOMY     TUBAL LIGATION     Family History  Problem Relation Age of Onset   Kidney disease Mother    Heart disease Mother    COPD Father    Cancer Father  esophageal   Kidney disease Sister    Cancer Brother 52       colon cancer (both brothers)   Cancer Brother    Heart attack Brother 73   Heart disease Brother    Breast cancer Neg Hx    Social History   Socioeconomic History   Marital status: Widowed    Spouse name: Not on file   Number of children: 1   Years of education: college   Highest education level: Not on file  Occupational History   Not on file  Tobacco Use   Smoking status: Never   Smokeless tobacco: Never  Vaping Use   Vaping Use: Never used  Substance and Sexual Activity   Alcohol use: No   Drug use: No   Sexual activity: Not Currently  Other Topics Concern   Not on file  Social History Narrative   Has 1 adopted daughter.   Social Determinants of Health   Financial Resource Strain: Low Risk  (04/13/2022)   Overall Financial Resource Strain (CARDIA)    Difficulty of Paying Living Expenses: Not hard at all  Food Insecurity: No Food Insecurity (04/13/2022)   Hunger Vital Sign    Worried About Running Out of Food in the Last Year: Never true    Ran Out of Food in the Last Year: Never true  Transportation Needs: No Transportation Needs (04/13/2022)   PRAPARE - Hydrologist (Medical): No    Lack of Transportation (Non-Medical): No  Physical Activity: Insufficiently Active (04/13/2022)   Exercise Vital Sign    Days of Exercise per Week: 3 days    Minutes of  Exercise per Session: 30 min  Stress: No Stress Concern Present (04/13/2022)   [place]    Feeling of Stress : Not at all  Social Connections: Unknown (04/13/2022)   Social Connection and Isolation Panel [NHANES]    Frequency of Communication with Friends and Family: More than three times a week    Frequency of Social Gatherings with Friends and Family: More than three times a week    Attends Religious Services: Not on file    Active Member of Clubs or Organizations: Not on file    Attends Archivist Meetings: Not on file    Marital Status: Widowed    Tobacco Counseling Counseling given: Not Answered   Clinical Intake:  Pre-visit preparation completed: Yes        Diabetes: No  How often do you need to have someone help you when you read instructions, pamphlets, or other written materials from your doctor or pharmacy?: 1 - Never   Interpreter Needed?: No      Activities of Daily Living    04/13/2022   10:36 AM 05/06/2021    6:00 PM  In your present state of health, do you have any difficulty performing the following activities:  Hearing? 1 1  Comment Hearing aids   Vision? 0 0  Difficulty concentrating or making decisions? 0 0  Walking or climbing stairs? 1 1  Comment Walker in use   Dressing or bathing? 0 0  Doing errands, shopping? 1 0  Comment Family assist as needed. Limited driving.   Preparing Food and eating ? N   Using the Toilet? N   In the past six months, have you accidently leaked urine? Y   Comment Managed with daily pad   Do you have problems with loss of bowel  control? N   Managing your Medications? N   Comment Managed with pill box   Managing your Finances? N   Housekeeping or managing your Housekeeping? Y     Patient Care Team: [name], MD as PCP - General (Internal Medicine) [name], MD (Internal Medicine) Bary Castilla Forest Gleason, MD (General  Surgery) Oneta Rack, MD (Dermatology)  Indicate any recent Medical Services you may have received from other than [facility] providers in the past year (date may be approximate).     Assessment:   This is a routine wellness examination for [place].  Virtual Visit via Telephone Note  I connected with  [name] on 04/13/22 at 10:30 AM EDT by telephone and verified that I am speaking with the correct person using two identifiers.  Location: Patient: home Provider: office Persons participating in the virtual visit: patient/Nurse Health Advisor   I discussed the limitations of performing an evaluation and management service by telehealth. We continued and completed visit with audio only. Some vital signs may be absent or patient reported.   Hearing/Vision screen Hearing Screening - Comments:: Followed by Dr. Charolett Bumpers Hearing aid, bilateral  Vision Screening - Comments:: Followed by Dr. Matilde Sprang  Wears corrective lenses  Cataract extraction, bilateral  They have seen their ophthalmologist in the last 12 months.  Dietary issues and exercise activities discussed: Current Exercise Habits: Home exercise routine (Post physical therapy exercises), Type of exercise: stretching;walking;strength training/weights (Balance exercises), Time (Minutes): 30, Frequency (Times/Week): 5, Weekly Exercise (Minutes/Week): 150, Intensity: Mild Regular diet Good water intake   Goals Addressed               This Visit's Progress     Patient Stated     Increase physical activity (pt-stated)        Walk more for exercise       Depression Screen    04/13/2022   10:42 AM 10/21/2021    3:08 PM 08/27/2021    4:21 PM 06/30/2021    3:11 PM 06/18/2021   11:17 AM 04/10/2021    9:56 AM 11/20/2020    2:16 PM  PHQ 2/9 Scores  PHQ - 2 Score 0 1 0 1 2 0 1  PHQ- 9 Score  1 0    6    Fall Risk    04/13/2022   10:58 AM 10/21/2021    3:08 PM 08/27/2021    4:20 PM 06/30/2021    3:10 PM 06/18/2021    11:16 AM  [place] in the past year? 0 0 '1 1 1  '$ Number falls in past yr:   '1 1 1  '$ Injury with Fall?   0 0 0  Risk for fall due to :  History of fall(s) History of fall(s) Medication side effect;Impaired balance/gait;Impaired mobility;Impaired vision   Follow up Falls evaluation completed Falls prevention discussed Falls evaluation completed Falls evaluation completed;Education provided;Falls prevention discussed Falls evaluation completed    FALL RISK PREVENTION PERTAINING TO THE HOME: Home free of loose throw rugs in walkways, pet beds, electrical cords, etc? Yes  Adequate lighting in your home to reduce risk of falls? Yes   ASSISTIVE DEVICES UTILIZED TO PREVENT FALLS: Life alert? No  Use of a cane, walker or w/c? Yes  Grab bars in the bathroom? Yes  Shower chair or bench in shower? Yes  Elevated toilet seat or a handicapped toilet? No   TIMED UP AND GO: Was the test performed? No .  Cognitive Function: Patient is alert and oriented x3.  Enjoys brain games for stimulation.      03/31/2018    1:25 PM 03/30/2017    1:37 PM 08/12/2015    3:06 PM  MMSE - Mini Mental State Exam  Orientation to time '5 5 5  '$ Orientation to Place '5 5 5  '$ Registration '3 3 3  '$ Attention/ Calculation '5 5 5  '$ Recall '3 3 3  '$ Language- name 2 objects '2 2 2  '$ Language- repeat '1 1 1  '$ Language- follow 3 step command '3 3 3  '$ Language- read & follow direction '1 1 1  '$ Write a sentence '1 1 1  '$ Copy design '1 1 1  '$ Total score '30 30 30        '$ 04/03/2020    9:51 AM 04/03/2019    9:57 AM  6CIT Screen  What Year? 0 points 0 points  What month? 0 points 0 points  What time? 0 points 0 points  Count back from 20 0 points 0 points  Months in reverse 0 points 0 points  Repeat phrase 0 points 0 points  Total Score 0 points 0 points    Immunizations Immunization History  Administered Date(s) Administered   Fluad Quad(high Dose 65+) 06/18/2021   Influenza Split 07/08/2012   Influenza, High Dose Seasonal  PF 06/08/2016, 06/11/2017, 06/06/2019   Influenza,inj,Quad PF,6+ Mos 06/08/2013, 05/04/2014, 05/06/2015   Influenza-Unspecified 05/04/2018, 06/04/2020   Moderna Sars-Covid-2 Vaccination 08/13/2020   PFIZER(Purple Top)SARS-COV-2 Vaccination 09/27/2019, 10/21/2019   Pneumococcal Conjugate-13 03/06/2014   Pneumococcal Polysaccharide-23 10/04/2018   Pneumococcal-Unspecified 10/11/2007   Tdap 01/05/2013   Zoster, Live 01/12/2012   Shingrix Completed?: No.    Education has been provided regarding the importance of this vaccine. Patient has been advised to call insurance company to determine out of pocket expense if they have not yet received this vaccine. Advised may also receive vaccine at local pharmacy or Health Dept. Verbalized acceptance and understanding.  Screening Tests Health Maintenance  Topic Date Due   INFLUENZA VACCINE  04/07/2022   COVID-19 Vaccine (4 - Booster) 04/29/2022 (Originally 10/08/2020)   Zoster Vaccines- Shingrix (1 of 2) 07/14/2022 (Originally 01/04/1954)   TETANUS/TDAP  02/21/2023   Pneumonia Vaccine 15+ Years old  Completed   DEXA SCAN  Completed   HPV VACCINES  Aged Out    Health Maintenance  Health Maintenance Due  Topic Date Due   INFLUENZA VACCINE  04/07/2022   Lung Cancer Screening: (Low Dose CT Chest recommended if Age 23-80 years, 30 pack-year currently smoking OR have quit w/in 15years.) does not qualify.   Hepatitis C Screening: does not qualify.  Vision Screening: Recommended annual ophthalmology exams for early detection of glaucoma and other disorders of the eye.  Dental Screening: Recommended annual dental exams for proper oral hygiene  Community Resource Referral / Chronic Care Management: CRR required this visit?  No   CCM required this visit?  No      Plan:   Keep all routine maintenance appointments.   I have personally reviewed and noted the following in the patient's chart:   Medical and social history Use of alcohol, tobacco  or illicit drugs  Current medications and supplements including opioid prescriptions.  Functional ability and status Nutritional status Physical activity Advanced directives List of other physicians Hospitalizations, surgeries, and ER visits in previous 12 months Vitals Screenings to include cognitive, depression, and falls Referrals and appointments  In addition, I have reviewed and discussed with patient certain preventive protocols,  quality metrics, and best practice recommendations. A written personalized care plan for preventive services as well as general preventive health recommendations were provided to patient.     OBrien-Blaney, Jovie Swanner L, LPN   11/12/3289      I have reviewed the above information and agree with above.   Deborra Medina, MD

## 2022-04-15 ENCOUNTER — Telehealth: Payer: Self-pay

## 2022-04-15 ENCOUNTER — Telehealth: Payer: Self-pay | Admitting: Oncology

## 2022-04-15 ENCOUNTER — Telehealth: Payer: Self-pay | Admitting: Internal Medicine

## 2022-04-15 DIAGNOSIS — D61818 Other pancytopenia: Secondary | ICD-10-CM

## 2022-04-15 NOTE — Telephone Encounter (Signed)
Katherine From [facility] called in wondering if Dr. Derrel Nip can place an lab order for CBC/ w Diff for pt... Belenda Cruise stated that pt needs labs before her next appt... Katherine requesting callback at [phone]

## 2022-04-15 NOTE — Telephone Encounter (Signed)
Spoke to [place] at Dr. Demetrios Isaacs office to ask if they are able to draw a CBC w/diff for our office on 8/15 when pt sees Dr. Derrel Nip to eleminate an extra stick on 8/22. Per Janett Billow she will reach out to providers CMA and ask if it is okay. I will go ahead and fax request over to their office and request a call back in order to determine if they will draw CBC w./diff.

## 2022-04-15 NOTE — Telephone Encounter (Signed)
I have ordered the CBC w/ diff. Is there anything else that needs to be ordered?

## 2022-04-15 NOTE — Telephone Encounter (Signed)
pt daughter called in regrds to LAB appt on 8/22. States that LAB order was to be sent to Dr. Derrel Nip office to be done there and is just requesting that it be completed.

## 2022-04-15 NOTE — Telephone Encounter (Signed)
Spoke to [place] at Dr. Demetrios Isaacs office to ask if they are able to draw a CBC w/diff for our office on 8/15 when pt sees Dr. Derrel Nip to eleminate an extra stick on 8/22. Per Janett Billow she will reach out to providers CMA and ask if it is okay. I will go ahead and fax request over to their office and request a call back in order to determine if they will draw CBC w./diff.

## 2022-04-17 ENCOUNTER — Other Ambulatory Visit: Payer: Self-pay | Admitting: Internal Medicine

## 2022-04-21 ENCOUNTER — Encounter: Payer: Self-pay | Admitting: Internal Medicine

## 2022-04-21 ENCOUNTER — Ambulatory Visit (INDEPENDENT_AMBULATORY_CARE_PROVIDER_SITE_OTHER): Payer: PPO | Admitting: Internal Medicine

## 2022-04-21 VITALS — BP 160/88 | HR 87 | Temp 98.4°F | Ht 62.0 in | Wt 145.4 lb

## 2022-04-21 DIAGNOSIS — F5102 Adjustment insomnia: Secondary | ICD-10-CM

## 2022-04-21 DIAGNOSIS — R7401 Elevation of levels of liver transaminase levels: Secondary | ICD-10-CM

## 2022-04-21 DIAGNOSIS — R2689 Other abnormalities of gait and mobility: Secondary | ICD-10-CM

## 2022-04-21 DIAGNOSIS — R040 Epistaxis: Secondary | ICD-10-CM

## 2022-04-21 DIAGNOSIS — R2681 Unsteadiness on feet: Secondary | ICD-10-CM

## 2022-04-21 DIAGNOSIS — D508 Other iron deficiency anemias: Secondary | ICD-10-CM | POA: Diagnosis not present

## 2022-04-21 DIAGNOSIS — I69359 Hemiplegia and hemiparesis following cerebral infarction affecting unspecified side: Secondary | ICD-10-CM

## 2022-04-21 DIAGNOSIS — L930 Discoid lupus erythematosus: Secondary | ICD-10-CM

## 2022-04-21 DIAGNOSIS — I10 Essential (primary) hypertension: Secondary | ICD-10-CM

## 2022-04-21 MED ORDER — LOSARTAN POTASSIUM 25 MG PO TABS
25.0000 mg | ORAL_TABLET | Freq: Every day | ORAL | 1 refills | Status: DC
Start: 2022-04-21 — End: 2022-07-27

## 2022-04-21 NOTE — Assessment & Plan Note (Signed)
USING 1/2 TRAZODONE AND TYLENOL PM RECENTLY ADDED .  Averaging  4 to 6 hours.  Trial of melatonin and trazodone

## 2022-04-21 NOTE — Assessment & Plan Note (Signed)
Checking PT/PTT and platelets.  Has thrombocytopenia.  Right nostril always.  May refer to [facility] for evaluation

## 2022-04-21 NOTE — Assessment & Plan Note (Signed)
Currently taking prednisone and MTX without disease in remission

## 2022-04-21 NOTE — Assessment & Plan Note (Signed)
Advised to resume losartan .  Has not been checking at home lately.

## 2022-04-21 NOTE — Assessment & Plan Note (Signed)
She has grown unsteady on her feet and notes worsening balance. Home health PT recommended

## 2022-04-21 NOTE — Assessment & Plan Note (Signed)
Secondary to loss of balance and hemiplegia from CVA.  Home PT ordered

## 2022-04-21 NOTE — Patient Instructions (Addendum)
1) For the insomnia:  resume melatonin at dinner time 5 mg dose and continue 1/2 tablet trazodone at bedtime  You can increase trazodone at bedtime to full tablet  after 3 days if no improvement Suspend tylenol pm for now   2)  For the nosebleeds:  Keep Afrin on hand and use to stop the next bleed  (squirt up nose , pinch nostril and tilt head back)    3) for the reflux :    Start using the omeprazole take 40 mg 30 minutes before lunch.   To prevent reflux   4) For the high blood pressure:  Resume losartan daily for blood pressure 25 mg dose

## 2022-04-21 NOTE — Progress Notes (Signed)
Subjective:  Patient ID: Kim Santiago, female    DOB: July 01, 1935  Age: 87 y.o. MRN: [id number]  CC: The primary encounter diagnosis was Discoid lupus. Diagnoses of Iron deficiency anemia secondary to inadequate dietary iron intake, Essential hypertension, Recurrent epistaxis, Frequent epistaxis, Gait instability, Poor balance, Insomnia due to stress, and Hemiplegia affecting dominant side, post-stroke [facility]) were also pertinent to this visit.   HPI [name] presents for follow up on multiple issues  Chief Complaint  Patient presents with   Follow-up    6 month follow up    1) cutaneous lupus:  last derm appt August 3rd  at [facility].  reviewed   the changes to meds as follows: " Decrease prednisone from 56m daily to 565mdaily (can cut 1062mill in half). Take 5mg23mily until your follow-up appointment.  2) Continue methotrexate 12.5mg 67mkly (5 pills) with folic acid supplementation 3mg d62my on non-methotrexate days 3) Continue topical clobetasol and triamcinolone as needed  2) pancytopenia:  now seeing Dr Rao, fJanese Bankst visit was in May:  "could be drug related.  Other possibility is it is autoimmune in the setting of other autoimmune disorders versus a primary bone marrow disorder such as MDS.  However given that her counts are overall stable and not showing a consistent declining trend I will hold off on a bone marrow biopsy at this time."   3) Poor sleep;  trouble staying asleep .  Attributed to prednisone.  Falls asleep fine,  wakes up early , averaging 4 to 6 hours .  Has tried melatonin in the past.  Currently taking 1/2 trazodone   4) Gait instability:  her left leg still feels weak from  her stroke , and she has poor balance and easy fatiguing /  hOME PT helped in the past ; still doing exercises at home.   No longer driving   5) RECURRENT EPISTAXIS , occurring spontaneously without provocation,  several in the past 3  months at least one per week for .  Always  on the right  side.  No ER visits.  ENT is JuengeKathyrn Sheriff not seen him for this .    Outpatient Medications Prior to Visit  Medication Sig Dispense Refill   atorvastatin (LIPITOR) 20 MG tablet TAKE 1 TABLET BY MOUTH EVERY DAY AT 6 PM 90 tablet 3   Calcium Carbonate-Vitamin D 600-200 MG-UNIT TABS Take 2 tablets by mouth daily.     cholecalciferol (VITAMIN D) 1000 UNITS tablet Take 2,000 Units by mouth daily.     CRANBERRY PO Take 1 capsule by mouth 2 (two) times daily.     diphenhydramine-acetaminophen (TYLENOL PM) 25-500 MG TABS tablet Take 1 tablet by mouth at bedtime.     ferrous gluconate (FERGON) 324 MG tablet Take 1 tablet (324 mg total) by mouth daily with breakfast. 30 tablet 3   folic acid (FOLVITE) 1 MG tablet Take 2 mg by mouth daily.     furosemide (LASIX) 20 MG tablet 1 TABLET EVERY OTHER DAY AS NEEDED 45 tablet 2   levothyroxine (SYNTHROID) 75 MCG tablet TAKE 1 TABLET BY MOUTH (75MCG TOTAL) DAILY 90 tablet 2   methotrexate (RHEUMATREX) 2.5 MG tablet Take 15 mg by mouth once a week.     metolazone (ZAROXOLYN) 2.5 MG tablet Take 2.5 mg by mouth every other day.     metoprolol succinate (TOPROL-XL) 25 MG 24 hr tablet TAKE 1 TABLET BY MOUTH EVERY DAY 90 tablet 3  nitroGLYCERIN (NITROSTAT) 0.4 MG SL tablet Place 1 tablet (0.4 mg total) under the tongue every 5 (five) minutes as needed for chest pain. Maximum 3 doses 50 tablet 3   omeprazole (PRILOSEC) 20 MG capsule TAKE 1 CAPSULE (20 MG TOTAL) BY MOUTH 2 (TWO) TIMES DAILY BEFORE A MEAL. 180 capsule 1   polyethylene glycol (MIRALAX / GLYCOLAX) packet Take 17 g by mouth daily.     Probiotic Product (PROBIOTIC PO) Take 1 tablet by mouth daily.     sertraline (ZOLOFT) 25 MG tablet TAKE 1 TABLET (25 MG TOTAL) BY MOUTH DAILY. 90 tablet 1   sodium fluoride (PREVIDENT 5000 PLUS) 1.1 % CREA dental cream Take 1 application by mouth daily.      spironolactone (ALDACTONE) 25 MG tablet Take 1 tablet by mouth every other day.     tizanidine (ZANAFLEX) 2 MG  capsule Take 2 mg by mouth 3 (three) times daily as needed for muscle spasms.     traZODone (DESYREL) 50 MG tablet Take 50 mg by mouth at bedtime.     triamcinolone cream (KENALOG) 0.1 % APPLY TO AFFECTED AREA TWICE A DAY     MELATONIN PO Take 1 tablet by mouth every evening. (Patient not taking: Reported on 04/21/2022)     No facility-administered medications prior to visit.    Review of Systems;  Patient denies headache, fevers, malaise, unintentional weight loss, skin rash, eye pain, sinus congestion and sinus pain, sore throat, dysphagia,  hemoptysis , cough, dyspnea, wheezing, chest pain, palpitations, orthopnea, edema, abdominal pain, nausea, melena, diarrhea, constipation, flank pain, dysuria, hematuria, urinary  Frequency, nocturia, numbness, tingling, seizures,  Focal weakness, Loss of consciousness,  Tremor, insomnia, depression, anxiety, and suicidal ideation.      Objective:  BP (!) 160/88 (BP Location: Right Arm, Patient Position: Sitting, Cuff Size: Normal)   Pulse 87   Temp 98.4 F (36.9 C) (Oral)   Ht _0  (1.575 m)   Wt 145 lb 6.4 oz (66 kg)   SpO2 99%   BMI 26.59 kg/m   BP Readings from Last 3 Encounters:  04/21/22 (!) 160/88  01/06/22 116/78  10/21/21 (!) 142/68    Wt Readings from Last 3 Encounters:  04/21/22 145 lb 6.4 oz (66 kg)  04/13/22 142 lb (64.4 kg)  01/06/22 142 lb 8 oz (64.6 kg)    General appearance: alert, cooperative and appears stated age Ears: normal TM's and external ear canals both ears Throat: lips, mucosa, and tongue normal; teeth and gums normal Neck: no adenopathy, no carotid bruit, supple, symmetrical, trachea midline and thyroid not enlarged, symmetric, no tenderness/mass/nodules Back: symmetric, no curvature. ROM normal. No CVA tenderness. Lungs: clear to auscultation bilaterally Heart: regular rate and rhythm, S1, S2 normal, no murmur, click, rub or gallop Abdomen: soft, non-tender; bowel sounds normal; no masses,  no  organomegaly Pulses: 2+ and symmetric Skin: Skin color, texture, turgor normal. No rashes or lesions Lymph nodes: Cervical, supraclavicular, and axillary nodes normal.  Lab Results  Component Value Date   HGBA1C 4.9 11/08/2018    Lab Results  Component Value Date   CREATININE 0.62 01/06/2022   CREATININE 0.80 12/02/2021   CREATININE 0.75 10/21/2021    Lab Results  Component Value Date   WBC 2.6 (L) 01/06/2022   HGB 10.7 (L) 01/06/2022   HCT 30.1 (L) 01/06/2022   PLT 132 (L) 01/06/2022   GLUCOSE 99 01/06/2022   CHOL 92 07/23/2020   TRIG 110.0 07/23/2020   HDL 31.40 (L) 07/23/2020  LDLDIRECT 117.6 12/27/2012   LDLCALC 39 07/23/2020   ALT 10 01/06/2022   AST 24 01/06/2022   NA 128 (L) 01/06/2022   K 4.3 01/06/2022   CL 97 (L) 01/06/2022   CREATININE 0.62 01/06/2022   BUN 16 01/06/2022   CO2 27 01/06/2022   TSH 1.578 01/06/2022   INR 1.0 12/26/2021   HGBA1C 4.9 11/08/2018    DG Chest Port 1 View  Result Date: 05/10/2021 CLINICAL DATA:  Abnormal chest x-ray. EXAM: PORTABLE CHEST 1 VIEW COMPARISON:  Radiograph 05/05/2021.  CT 05/08/2021 FINDINGS: Worsening lung aeration with progressive patchy opacities involving the right greater than left lung. Small pleural effusions have increased. Heart is normal in size, stable mediastinal contours. No pneumothorax. Again seen eventration of right hemidiaphragm. IMPRESSION: Worsening lung aeration with progressive patchy opacities involving the right greater than left lung. Findings may favor multifocal pneumonia over pulmonary edema. Small bilateral pleural effusions. Electronically Signed   By: Keith Rake M.D.   On: 05/10/2021 17:25   ECHOCARDIOGRAM COMPLETE  Result Date: 05/10/2021    ECHOCARDIOGRAM REPORT   Patient Name:   Kim Santiago Date of Exam: 05/10/2021 Medical Rec #:  [id number]        Height:       63.0 in Accession #:    [id number]       Weight:       143.3 lb Date of Birth:  Jan 20, 1935        BSA:          1.678 m  Patient Age:    58 years         BP:           169/79 mmHg Patient Gender: F                HR:           99 bpm. Exam Location:  [facility] Procedure: 2D Echo and Strain Analysis Indications:     CHF Acute Systolic P71.06  History:         Patient has prior history of Echocardiogram examinations, most                  recent 11/08/2018.  Sonographer:     Kathlen Brunswick RDCS Referring Phys:  [id number] Ottie Glazier Diagnosing Phys: Kate Sable MD  Sonographer Comments: Global longitudinal strain was attempted. IMPRESSIONS  1. Left ventricular ejection fraction, by estimation, is 55 to 60%. The left ventricle has normal function. The left ventricle has no regional wall motion abnormalities. Left ventricular diastolic parameters were normal.  2. Right ventricular systolic function is normal. The right ventricular size is normal.  3. The mitral valve is normal in structure. No evidence of mitral valve regurgitation.  4. The aortic valve is tricuspid. Aortic valve regurgitation is not visualized.  5. The inferior vena cava is normal in size with greater than 50% respiratory variability, suggesting right atrial pressure of 3 mmHg. FINDINGS  Left Ventricle: Left ventricular ejection fraction, by estimation, is 55 to 60%. The left ventricle has normal function. The left ventricle has no regional wall motion abnormalities. Global longitudinal strain performed but not reported based on interpreter judgement due to suboptimal tracking. The left ventricular internal cavity size was normal in size. There is no left ventricular hypertrophy. Left ventricular diastolic parameters were normal. Right Ventricle: The right ventricular size is normal. No increase in right ventricular wall thickness. Right ventricular systolic function is normal. Left Atrium: Left atrial size was  normal in size. Right Atrium: Right atrial size was normal in size. Pericardium: There is no evidence of pericardial effusion. Mitral Valve: The mitral valve is  normal in structure. No evidence of mitral valve regurgitation. Tricuspid Valve: The tricuspid valve is normal in structure. Tricuspid valve regurgitation is mild. Aortic Valve: The aortic valve is tricuspid. Aortic valve regurgitation is not visualized. Aortic valve peak gradient measures 6.8 mmHg. Pulmonic Valve: The pulmonic valve was normal in structure. Pulmonic valve regurgitation is trivial. Aorta: The aortic root and ascending aorta are structurally normal, with no evidence of dilitation. Venous: The inferior vena cava is normal in size with greater than 50% respiratory variability, suggesting right atrial pressure of 3 mmHg. IAS/Shunts: No atrial level shunt detected by color flow Doppler.  LEFT VENTRICLE PLAX 2D LVIDd:         3.50 cm  Diastology LVIDs:         2.50 cm  LV e' medial:    14.10 cm/s LV PW:         1.00 cm  LV E/e' medial:  7.6 LV IVS:        1.00 cm  LV e' lateral:   15.30 cm/s LVOT diam:     1.90 cm  LV E/e' lateral: 7.0 LV SV:         53 LV SV Index:   32 LVOT Area:     2.84 cm  RIGHT VENTRICLE RV Basal diam:  2.90 cm RV S prime:     15.10 cm/s TAPSE (M-mode): 2.1 cm LEFT ATRIUM             Index       RIGHT ATRIUM          Index LA diam:        3.00 cm 1.79 cm/m  RA Area:     9.68 cm LA Vol (A2C):   22.6 ml 13.47 ml/m RA Volume:   20.40 ml 12.16 ml/m LA Vol (A4C):   20.6 ml 12.28 ml/m LA Biplane Vol: 22.5 ml 13.41 ml/m  AORTIC VALVE                PULMONIC VALVE AV Area (Vmax): 2.11 cm    PV Vmax:       0.95 m/s AV Vmax:        130.00 cm/s PV Peak grad:  3.6 mmHg AV Peak Grad:   6.8 mmHg LVOT Vmax:      96.60 cm/s LVOT Vmean:     59.600 cm/s LVOT VTI:       0.187 m  AORTA Ao Root diam: 2.90 cm Ao Asc diam:  3.20 cm MITRAL VALVE                TRICUSPID VALVE MV Area (PHT): 5.16 cm     TV Peak grad:   27.1 mmHg MV Decel Time: 147 msec     TV Vmax:        2.61 m/s MV E velocity: 107.00 cm/s MV A velocity: 33.40 cm/s   SHUNTS MV E/A ratio:  3.20         Systemic VTI:  0.19 m                              Systemic Diam: 1.90 cm Kate Sable MD Electronically signed by Kate Sable MD Signature Date/Time: 05/10/2021/3:32:19 PM    Final     Assessment & Plan:  Problem List Items Addressed This Visit     Essential hypertension    Advised to resume losartan .  Has not been checking at home lately.       Relevant Medications   losartan (COZAAR) 25 MG tablet   Other Relevant Orders   Comprehensive metabolic panel   Insomnia due to stress    USING 1/2 TRAZODONE AND TYLENOL PM RECENTLY ADDED .  Averaging  4 to 6 hours.  Trial of melatonin and trazodone       Discoid lupus - Primary    Currently taking prednisone and MTX without disease in remission       Hemiplegia affecting dominant side, post-stroke (HCC)    She has grown unsteady on her feet and notes worsening balance. Home health PT recommended       Iron deficiency anemia   Relevant Orders   CBC with Differential/Platelet   Frequent epistaxis    Checking PT/PTT and platelets.  Has thrombocytopenia.  Right nostril always.  May refer to [facility] for evaluation       Relevant Orders   Ambulatory referral to ENT   Gait instability    Secondary to loss of balance and hemiplegia from CVA.  Home PT ordered       Relevant Orders   Ambulatory referral to [facility]   Other Visit Diagnoses     Recurrent epistaxis       Relevant Medications   losartan (COZAAR) 25 MG tablet   Other Relevant Orders   Protime-INR   PTT   Poor balance       Relevant Orders   Ambulatory referral to [facility]       I spent a total of 40  minutes with this patient in a face to face visit on the date of this encounter reviewing the last office visit with me 6 months ago,  most recent visit with patient's  dermatologist at [facility] , and post visit ordering of testing and therapeutics.    Follow-up: No follow-ups on file.   [name], MD

## 2022-04-22 LAB — COMPREHENSIVE METABOLIC PANEL
ALT: 21 U/L (ref 0–35)
AST: 44 U/L — ABNORMAL HIGH (ref 0–37)
Albumin: 4.1 g/dL (ref 3.5–5.2)
Alkaline Phosphatase: 59 U/L (ref 39–117)
BUN: 21 mg/dL (ref 6–23)
CO2: 28 mEq/L (ref 19–32)
Calcium: 9.4 mg/dL (ref 8.4–10.5)
Chloride: 97 mEq/L (ref 96–112)
Creatinine, Ser: 0.77 mg/dL (ref 0.40–1.20)
GFR: 70.4 mL/min (ref >60.00)
Glucose, Bld: 97 mg/dL (ref 70–99)
Potassium: 4.5 mEq/L (ref 3.5–5.1)
Sodium: 134 mEq/L — ABNORMAL LOW (ref 135–145)
Total Bilirubin: 0.9 mg/dL (ref 0.2–1.2)
Total Protein: 5.9 g/dL — ABNORMAL LOW (ref 6.0–8.3)

## 2022-04-22 LAB — CBC WITH DIFFERENTIAL/PLATELET
Basophils Absolute: 0.1 10*3/uL (ref 0.0–0.1)
Basophils Relative: 1 % (ref 0.0–3.0)
Eosinophils Absolute: 0 10*3/uL (ref 0.0–0.7)
Eosinophils Relative: 0.5 % (ref 0.0–5.0)
HCT: 30.6 % — ABNORMAL LOW (ref 36.0–46.0)
Hemoglobin: 10.9 g/dL — ABNORMAL LOW (ref 12.0–15.0)
Lymphocytes Relative: 13.1 % (ref 12.0–46.0)
Lymphs Abs: 0.7 10*3/uL (ref 0.7–4.0)
MCHC: 35.5 g/dL (ref 30.0–36.0)
MCV: 115.2 fl — ABNORMAL HIGH (ref 78.0–100.0)
Monocytes Absolute: 0.1 10*3/uL (ref 0.1–1.0)
Monocytes Relative: 2.5 % — ABNORMAL LOW (ref 3.0–12.0)
Neutro Abs: 4.7 10*3/uL (ref 1.4–7.7)
Neutrophils Relative %: 82.9 % — ABNORMAL HIGH (ref 43.0–77.0)
Platelets: 145 10*3/uL — ABNORMAL LOW (ref 150.0–400.0)
RBC: 2.66 Mil/uL — ABNORMAL LOW (ref 3.87–5.11)
RDW: 18.3 % — ABNORMAL HIGH (ref 11.5–15.5)
WBC: 5.7 10*3/uL (ref 4.0–10.5)

## 2022-04-22 LAB — PROTIME-INR
INR: 0.9
Prothrombin Time: 10 s (ref 9.0–11.5)

## 2022-04-22 LAB — APTT: aPTT: 28 s (ref 23–32)

## 2022-04-23 NOTE — Addendum Note (Signed)
Addended by: [name] on: 04/23/2022 08:45 PM   Modules accepted: Orders

## 2022-04-25 ENCOUNTER — Other Ambulatory Visit: Payer: Self-pay | Admitting: Internal Medicine

## 2022-04-28 ENCOUNTER — Other Ambulatory Visit: Payer: PPO

## 2022-05-04 ENCOUNTER — Telehealth: Payer: Self-pay

## 2022-05-04 ENCOUNTER — Telehealth: Payer: Self-pay | Admitting: Internal Medicine

## 2022-05-04 DIAGNOSIS — R2689 Other abnormalities of gait and mobility: Secondary | ICD-10-CM | POA: Diagnosis not present

## 2022-05-04 DIAGNOSIS — F5105 Insomnia due to other mental disorder: Secondary | ICD-10-CM | POA: Diagnosis not present

## 2022-05-04 DIAGNOSIS — I69952 Hemiplegia and hemiparesis following unspecified cerebrovascular disease affecting left dominant side: Secondary | ICD-10-CM | POA: Diagnosis not present

## 2022-05-04 DIAGNOSIS — D509 Iron deficiency anemia, unspecified: Secondary | ICD-10-CM | POA: Diagnosis not present

## 2022-05-04 DIAGNOSIS — R2681 Unsteadiness on feet: Secondary | ICD-10-CM | POA: Diagnosis not present

## 2022-05-04 DIAGNOSIS — I10 Essential (primary) hypertension: Secondary | ICD-10-CM | POA: Diagnosis not present

## 2022-05-04 DIAGNOSIS — R040 Epistaxis: Secondary | ICD-10-CM | POA: Diagnosis not present

## 2022-05-04 DIAGNOSIS — L930 Discoid lupus erythematosus: Secondary | ICD-10-CM | POA: Diagnosis not present

## 2022-05-04 DIAGNOSIS — D61818 Other pancytopenia: Secondary | ICD-10-CM | POA: Diagnosis not present

## 2022-05-04 NOTE — Telephone Encounter (Signed)
Raquel Sarna [facility] home health called stating she was referred by provider for the patient care. Raquel Sarna wanted to give verbal orders and I let her know they would have to be faxed in. Raquel Sarna stated there may be a delay in pt care because of the verbal orders being faxed, she wanted to see if her frequency was ok

## 2022-05-04 NOTE — Telephone Encounter (Signed)
LMTCB with Kim Santiago from [place].

## 2022-05-04 NOTE — Telephone Encounter (Signed)
Patient states she last saw Dr. Deborra Medina on 04/21/2022 but she did not schedule her follow-up or lab appointment at [facility].  I see that we have a lab order in Epic, so I scheduled a lab appointment for patient on 07/15/2022 at 2pm.  Patient states she would like to know if she should schedule a follow-up too.  Patient states if Dr. Derrel Nip would like for her to schedule a follow-up, then she would like to come at the same time as her lab appointment if possible.

## 2022-05-04 NOTE — Telephone Encounter (Signed)
Raquel Sarna returning call to [place]

## 2022-05-05 NOTE — Telephone Encounter (Signed)
Spoke with pt and scheduled her for a follow up on 07/15/2022. Pt prefers that she have her labs done on the same day.

## 2022-05-05 NOTE — Telephone Encounter (Signed)
Verbal okay given for PT for 2 times a week for 3 weeks and then 1 time a week for 1 week.

## 2022-05-06 ENCOUNTER — Other Ambulatory Visit: Payer: Self-pay | Admitting: Internal Medicine

## 2022-05-14 DIAGNOSIS — Z79631 Long term (current) use of antimetabolite agent: Secondary | ICD-10-CM | POA: Diagnosis not present

## 2022-05-14 DIAGNOSIS — Z515 Encounter for palliative care: Secondary | ICD-10-CM | POA: Diagnosis not present

## 2022-05-14 DIAGNOSIS — L931 Subacute cutaneous lupus erythematosus: Secondary | ICD-10-CM | POA: Diagnosis not present

## 2022-05-14 DIAGNOSIS — R2681 Unsteadiness on feet: Secondary | ICD-10-CM | POA: Diagnosis not present

## 2022-05-14 DIAGNOSIS — Z66 Do not resuscitate: Secondary | ICD-10-CM | POA: Diagnosis not present

## 2022-05-14 DIAGNOSIS — L930 Discoid lupus erythematosus: Secondary | ICD-10-CM | POA: Diagnosis not present

## 2022-05-14 DIAGNOSIS — I10 Essential (primary) hypertension: Secondary | ICD-10-CM | POA: Diagnosis not present

## 2022-05-14 DIAGNOSIS — R2689 Other abnormalities of gait and mobility: Secondary | ICD-10-CM | POA: Diagnosis not present

## 2022-05-14 DIAGNOSIS — D61818 Other pancytopenia: Secondary | ICD-10-CM | POA: Diagnosis not present

## 2022-05-14 DIAGNOSIS — I69952 Hemiplegia and hemiparesis following unspecified cerebrovascular disease affecting left dominant side: Secondary | ICD-10-CM | POA: Diagnosis not present

## 2022-05-14 DIAGNOSIS — F5105 Insomnia due to other mental disorder: Secondary | ICD-10-CM | POA: Diagnosis not present

## 2022-05-14 DIAGNOSIS — R040 Epistaxis: Secondary | ICD-10-CM | POA: Diagnosis not present

## 2022-05-14 DIAGNOSIS — D509 Iron deficiency anemia, unspecified: Secondary | ICD-10-CM | POA: Diagnosis not present

## 2022-05-14 DIAGNOSIS — Z7952 Long term (current) use of systemic steroids: Secondary | ICD-10-CM | POA: Diagnosis not present

## 2022-05-14 DIAGNOSIS — I69354 Hemiplegia and hemiparesis following cerebral infarction affecting left non-dominant side: Secondary | ICD-10-CM | POA: Diagnosis not present

## 2022-05-20 DIAGNOSIS — Z961 Presence of intraocular lens: Secondary | ICD-10-CM | POA: Diagnosis not present

## 2022-05-20 DIAGNOSIS — H52223 Regular astigmatism, bilateral: Secondary | ICD-10-CM | POA: Diagnosis not present

## 2022-05-20 DIAGNOSIS — H353131 Nonexudative age-related macular degeneration, bilateral, early dry stage: Secondary | ICD-10-CM | POA: Diagnosis not present

## 2022-05-20 DIAGNOSIS — M3500 Sicca syndrome, unspecified: Secondary | ICD-10-CM | POA: Diagnosis not present

## 2022-05-20 DIAGNOSIS — H5203 Hypermetropia, bilateral: Secondary | ICD-10-CM | POA: Diagnosis not present

## 2022-05-20 DIAGNOSIS — H524 Presbyopia: Secondary | ICD-10-CM | POA: Diagnosis not present

## 2022-05-20 DIAGNOSIS — H538 Other visual disturbances: Secondary | ICD-10-CM | POA: Diagnosis not present

## 2022-05-20 DIAGNOSIS — H26492 Other secondary cataract, left eye: Secondary | ICD-10-CM | POA: Diagnosis not present

## 2022-06-04 NOTE — Progress Notes (Signed)
   Established Patient Office Visit  Subjective   Patient ID:  Objective:      Assessment & Plan:   Problem List Items Addressed This Visit   None

## 2022-06-04 NOTE — Progress Notes (Deleted)
error 

## 2022-06-07 DIAGNOSIS — R040 Epistaxis: Secondary | ICD-10-CM | POA: Diagnosis not present

## 2022-06-07 DIAGNOSIS — I69952 Hemiplegia and hemiparesis following unspecified cerebrovascular disease affecting left dominant side: Secondary | ICD-10-CM | POA: Diagnosis not present

## 2022-06-07 DIAGNOSIS — L930 Discoid lupus erythematosus: Secondary | ICD-10-CM | POA: Diagnosis not present

## 2022-06-07 DIAGNOSIS — R2681 Unsteadiness on feet: Secondary | ICD-10-CM | POA: Diagnosis not present

## 2022-06-07 DIAGNOSIS — R2689 Other abnormalities of gait and mobility: Secondary | ICD-10-CM | POA: Diagnosis not present

## 2022-06-07 DIAGNOSIS — D509 Iron deficiency anemia, unspecified: Secondary | ICD-10-CM | POA: Diagnosis not present

## 2022-06-07 DIAGNOSIS — F5105 Insomnia due to other mental disorder: Secondary | ICD-10-CM | POA: Diagnosis not present

## 2022-06-07 DIAGNOSIS — D61818 Other pancytopenia: Secondary | ICD-10-CM | POA: Diagnosis not present

## 2022-06-07 DIAGNOSIS — I10 Essential (primary) hypertension: Secondary | ICD-10-CM | POA: Diagnosis not present

## 2022-07-01 ENCOUNTER — Other Ambulatory Visit: Payer: Self-pay | Admitting: Internal Medicine

## 2022-07-08 DIAGNOSIS — H6121 Impacted cerumen, right ear: Secondary | ICD-10-CM | POA: Diagnosis not present

## 2022-07-08 DIAGNOSIS — R040 Epistaxis: Secondary | ICD-10-CM | POA: Diagnosis not present

## 2022-07-08 DIAGNOSIS — H903 Sensorineural hearing loss, bilateral: Secondary | ICD-10-CM | POA: Diagnosis not present

## 2022-07-15 ENCOUNTER — Ambulatory Visit (INDEPENDENT_AMBULATORY_CARE_PROVIDER_SITE_OTHER): Payer: PPO | Admitting: Internal Medicine

## 2022-07-15 ENCOUNTER — Encounter: Payer: Self-pay | Admitting: Internal Medicine

## 2022-07-15 ENCOUNTER — Other Ambulatory Visit: Payer: PPO

## 2022-07-15 VITALS — BP 132/62 | HR 81 | Temp 98.1°F | Ht 62.0 in | Wt 146.6 lb

## 2022-07-15 DIAGNOSIS — Z23 Encounter for immunization: Secondary | ICD-10-CM

## 2022-07-15 DIAGNOSIS — D509 Iron deficiency anemia, unspecified: Secondary | ICD-10-CM

## 2022-07-15 DIAGNOSIS — I10 Essential (primary) hypertension: Secondary | ICD-10-CM

## 2022-07-15 DIAGNOSIS — F5102 Adjustment insomnia: Secondary | ICD-10-CM

## 2022-07-15 DIAGNOSIS — D61818 Other pancytopenia: Secondary | ICD-10-CM | POA: Diagnosis not present

## 2022-07-15 DIAGNOSIS — E782 Mixed hyperlipidemia: Secondary | ICD-10-CM | POA: Diagnosis not present

## 2022-07-15 NOTE — Progress Notes (Signed)
Subjective:  Patient ID: Kim Santiago, female    DOB: 1934-11-26  Age: 87 y.o. MRN: [id number]  CC: The primary encounter diagnosis was Essential hypertension. Diagnoses of Mixed hyperlipidemia, Iron deficiency anemia, unspecified iron deficiency anemia type, Pancytopenia ([place]), Need for immunization against influenza, and Insomnia due to stress were also pertinent to this visit.   HPI [name] presents for follow up on insomnia, hyperlipidemia, pancytopenia Chief Complaint  Patient presents with   Follow-up    Follow up    1) pancytopenia   sees Dr.  Janese Banks  next month .  No bone marrow biopsy yet.   2) insomnia:  using unclear dose of  melatonin and trazadone   3) recurrent epistaxis  : went to see Dr Kathyrn Sheriff found no source.  Frequency has decreased from daily to qo to 1-2 weekly , able to stop them quickly.  Advised to use vaseline to manage dryness  4) cutaneous lupus:  Dr Will Bonnet tomorrow.  Taking MTX and prednisine which is tapering to off o off currently taking .5 mg daily .   New outbreaks of face  .  Hands improving .  Has a painful area on left thumb joint from a purpura that was traumatized . Not infected.  Skin tear form being scraped by the underside of her table.   5) Generalized weakness:  finished  home PT ,  still doing the home exercises taught by the PT    Outpatient Medications Prior to Visit  Medication Sig Dispense Refill   atorvastatin (LIPITOR) 20 MG tablet TAKE 1 TABLET BY MOUTH EVERY DAY AT 6 PM 90 tablet 3   Calcium Carbonate-Vitamin D 600-200 MG-UNIT TABS Take 2 tablets by mouth daily.     cholecalciferol (VITAMIN D) 1000 UNITS tablet Take 2,000 Units by mouth daily.     CRANBERRY PO Take 1 capsule by mouth 2 (two) times daily.     ferrous gluconate (FERGON) 324 MG tablet TAKE 1 TABLET BY MOUTH DAILY WITH BREAKFAST 90 tablet 1   folic acid (FOLVITE) 1 MG tablet Take 2 mg by mouth daily.     furosemide (LASIX) 20 MG tablet TAKE 1 TABLET BY MOUTH  EVERY OTHER DAY AS NEEDED 45 tablet 2   levothyroxine (SYNTHROID) 75 MCG tablet TAKE 1 TABLET BY MOUTH (75MCG TOTAL) DAILY 90 tablet 2   losartan (COZAAR) 25 MG tablet Take 1 tablet (25 mg total) by mouth daily. 90 tablet 1   Melatonin 10 MG TABS Take 1 tablet by mouth daily at 2 am.     methotrexate (RHEUMATREX) 2.5 MG tablet Take 15 mg by mouth once a week.     metolazone (ZAROXOLYN) 2.5 MG tablet Take 2.5 mg by mouth every other day.     metoprolol succinate (TOPROL-XL) 25 MG 24 hr tablet TAKE 1 TABLET BY MOUTH EVERY DAY 90 tablet 3   nitroGLYCERIN (NITROSTAT) 0.4 MG SL tablet Place 1 tablet (0.4 mg total) under the tongue every 5 (five) minutes as needed for chest pain. Maximum 3 doses 50 tablet 3   omeprazole (PRILOSEC) 20 MG capsule TAKE 1 CAPSULE (20 MG TOTAL) BY MOUTH 2 (TWO) TIMES DAILY BEFORE A MEAL. 180 capsule 1   polyethylene glycol (MIRALAX / GLYCOLAX) packet Take 17 g by mouth daily.     Probiotic Product (PROBIOTIC PO) Take 1 tablet by mouth daily.     sertraline (ZOLOFT) 25 MG tablet TAKE 1 TABLET (25 MG TOTAL) BY MOUTH DAILY. 90 tablet 1  sodium fluoride (PREVIDENT 5000 PLUS) 1.1 % CREA dental cream Take 1 application by mouth daily.      spironolactone (ALDACTONE) 25 MG tablet Take 1 tablet by mouth every other day.     tizanidine (ZANAFLEX) 2 MG capsule Take 2 mg by mouth 3 (three) times daily as needed for muscle spasms.     traZODone (DESYREL) 50 MG tablet Take 50 mg by mouth at bedtime.     triamcinolone cream (KENALOG) 0.1 % APPLY TO AFFECTED AREA TWICE A DAY     diphenhydramine-acetaminophen (TYLENOL PM) 25-500 MG TABS tablet Take 1 tablet by mouth at bedtime. (Patient not taking: Reported on 07/15/2022)     No facility-administered medications prior to visit.    Review of Systems;  Patient denies headache, fevers, malaise, unintentional weight loss, skin rash, eye pain, sinus congestion and sinus pain, sore throat, dysphagia,  hemoptysis , cough, dyspnea, wheezing,  chest pain, palpitations, orthopnea, edema, abdominal pain, nausea, melena, diarrhea, constipation, flank pain, dysuria, hematuria, urinary  Frequency, nocturia, numbness, tingling, seizures,  Focal weakness, Loss of consciousness,  Tremor, insomnia, depression, anxiety, and suicidal ideation.      Objective:  BP 132/62 (BP Location: Left Arm, Patient Position: Sitting, Cuff Size: Normal)   Pulse 81   Temp 98.1 F (36.7 C) (Oral)   Ht _0  (1.575 m)   Wt 146 lb 9.6 oz (66.5 kg)   SpO2 97%   BMI 26.81 kg/m   BP Readings from Last 3 Encounters:  07/15/22 132/62  04/21/22 (!) 160/88  01/06/22 116/78    Wt Readings from Last 3 Encounters:  07/15/22 146 lb 9.6 oz (66.5 kg)  04/21/22 145 lb 6.4 oz (66 kg)  04/13/22 142 lb (64.4 kg)    General appearance: alert, cooperative and appears stated age Ears: normal TM's and external ear canals both ears Throat: lips, mucosa, and tongue normal; teeth and gums normal Neck: no adenopathy, no carotid bruit, supple, symmetrical, trachea midline and thyroid not enlarged, symmetric, no tenderness/mass/nodules Back: symmetric, no curvature. ROM normal. No CVA tenderness. Lungs: clear to auscultation bilaterally Heart: regular rate and rhythm, S1, S2 normal, no murmur, click, rub or gallop Abdomen: soft, non-tender; bowel sounds normal; no masses,  no organomegaly Pulses: 2+ and symmetric Skin: Skin color, texture, turgor normal. No rashes or lesions Lymph nodes: Cervical, supraclavicular, and axillary nodes normal. Neuro:  awake and interactive with normal mood and affect. Higher cortical functions are normal. Speech is clear without word-finding difficulty or dysarthria. Extraocular movements are intact. Visual fields of both eyes are grossly intact. Sensation to light touch is grossly intact bilaterally of upper and lower extremities. Motor examination shows 4+/5 symmetric hand grip and upper extremity and 5/5 lower extremity strength. There is  no pronation or drift. Gait is non-ataxic   Lab Results  Component Value Date   HGBA1C 4.9 11/08/2018    Lab Results  Component Value Date   CREATININE 0.77 04/21/2022   CREATININE 0.62 01/06/2022   CREATININE 0.80 12/02/2021    Lab Results  Component Value Date   WBC 5.7 04/21/2022   HGB 10.9 (L) 04/21/2022   HCT 30.6 (L) 04/21/2022   PLT 145.0 (L) 04/21/2022   GLUCOSE 97 04/21/2022   CHOL 92 07/23/2020   TRIG 110.0 07/23/2020   HDL 31.40 (L) 07/23/2020   LDLDIRECT 117.6 12/27/2012   LDLCALC 39 07/23/2020   ALT 21 04/21/2022   AST 44 (H) 04/21/2022   NA 134 (L) 04/21/2022   K 4.5  04/21/2022   CL 97 04/21/2022   CREATININE 0.77 04/21/2022   BUN 21 04/21/2022   CO2 28 04/21/2022   TSH 1.578 01/06/2022   INR 0.9 04/21/2022   HGBA1C 4.9 11/08/2018    DG Chest Port 1 View  Result Date: 05/10/2021 CLINICAL DATA:  Abnormal chest x-ray. EXAM: PORTABLE CHEST 1 VIEW COMPARISON:  Radiograph 05/05/2021.  CT 05/08/2021 FINDINGS: Worsening lung aeration with progressive patchy opacities involving the right greater than left lung. Small pleural effusions have increased. Heart is normal in size, stable mediastinal contours. No pneumothorax. Again seen eventration of right hemidiaphragm. IMPRESSION: Worsening lung aeration with progressive patchy opacities involving the right greater than left lung. Findings may favor multifocal pneumonia over pulmonary edema. Small bilateral pleural effusions. Electronically Signed   By: Keith Rake M.D.   On: 05/10/2021 17:25   ECHOCARDIOGRAM COMPLETE  Result Date: 05/10/2021    ECHOCARDIOGRAM REPORT   Patient Name:   TRANG BOUSE Date of Exam: 05/10/2021 Medical Rec #:  [id number]        Height:       63.0 in Accession #:    [id number]       Weight:       143.3 lb Date of Birth:  1935/01/05        BSA:          1.678 m Patient Age:    52 years         BP:           169/79 mmHg Patient Gender: F                HR:           99 bpm. Exam Location:   [facility] Procedure: 2D Echo and Strain Analysis Indications:     CHF Acute Systolic P82.42  History:         Patient has prior history of Echocardiogram examinations, most                  recent 11/08/2018.  Sonographer:     Kathlen Brunswick RDCS Referring Phys:  [id number] Ottie Glazier Diagnosing Phys: Kate Sable MD  Sonographer Comments: Global longitudinal strain was attempted. IMPRESSIONS  1. Left ventricular ejection fraction, by estimation, is 55 to 60%. The left ventricle has normal function. The left ventricle has no regional wall motion abnormalities. Left ventricular diastolic parameters were normal.  2. Right ventricular systolic function is normal. The right ventricular size is normal.  3. The mitral valve is normal in structure. No evidence of mitral valve regurgitation.  4. The aortic valve is tricuspid. Aortic valve regurgitation is not visualized.  5. The inferior vena cava is normal in size with greater than 50% respiratory variability, suggesting right atrial pressure of 3 mmHg. FINDINGS  Left Ventricle: Left ventricular ejection fraction, by estimation, is 55 to 60%. The left ventricle has normal function. The left ventricle has no regional wall motion abnormalities. Global longitudinal strain performed but not reported based on interpreter judgement due to suboptimal tracking. The left ventricular internal cavity size was normal in size. There is no left ventricular hypertrophy. Left ventricular diastolic parameters were normal. Right Ventricle: The right ventricular size is normal. No increase in right ventricular wall thickness. Right ventricular systolic function is normal. Left Atrium: Left atrial size was normal in size. Right Atrium: Right atrial size was normal in size. Pericardium: There is no evidence of pericardial effusion. Mitral Valve: The mitral valve is normal in structure.  No evidence of mitral valve regurgitation. Tricuspid Valve: The tricuspid valve is normal in structure.  Tricuspid valve regurgitation is mild. Aortic Valve: The aortic valve is tricuspid. Aortic valve regurgitation is not visualized. Aortic valve peak gradient measures 6.8 mmHg. Pulmonic Valve: The pulmonic valve was normal in structure. Pulmonic valve regurgitation is trivial. Aorta: The aortic root and ascending aorta are structurally normal, with no evidence of dilitation. Venous: The inferior vena cava is normal in size with greater than 50% respiratory variability, suggesting right atrial pressure of 3 mmHg. IAS/Shunts: No atrial level shunt detected by color flow Doppler.  LEFT VENTRICLE PLAX 2D LVIDd:         3.50 cm  Diastology LVIDs:         2.50 cm  LV e' medial:    14.10 cm/s LV PW:         1.00 cm  LV E/e' medial:  7.6 LV IVS:        1.00 cm  LV e' lateral:   15.30 cm/s LVOT diam:     1.90 cm  LV E/e' lateral: 7.0 LV SV:         53 LV SV Index:   32 LVOT Area:     2.84 cm  RIGHT VENTRICLE RV Basal diam:  2.90 cm RV S prime:     15.10 cm/s TAPSE (M-mode): 2.1 cm LEFT ATRIUM             Index       RIGHT ATRIUM          Index LA diam:        3.00 cm 1.79 cm/m  RA Area:     9.68 cm LA Vol (A2C):   22.6 ml 13.47 ml/m RA Volume:   20.40 ml 12.16 ml/m LA Vol (A4C):   20.6 ml 12.28 ml/m LA Biplane Vol: 22.5 ml 13.41 ml/m  AORTIC VALVE                PULMONIC VALVE AV Area (Vmax): 2.11 cm    PV Vmax:       0.95 m/s AV Vmax:        130.00 cm/s PV Peak grad:  3.6 mmHg AV Peak Grad:   6.8 mmHg LVOT Vmax:      96.60 cm/s LVOT Vmean:     59.600 cm/s LVOT VTI:       0.187 m  AORTA Ao Root diam: 2.90 cm Ao Asc diam:  3.20 cm MITRAL VALVE                TRICUSPID VALVE MV Area (PHT): 5.16 cm     TV Peak grad:   27.1 mmHg MV Decel Time: 147 msec     TV Vmax:        2.61 m/s MV E velocity: 107.00 cm/s MV A velocity: 33.40 cm/s   SHUNTS MV E/A ratio:  3.20         Systemic VTI:  0.19 m                             Systemic Diam: 1.90 cm Kate Sable MD Electronically signed by Kate Sable MD Signature  Date/Time: 05/10/2021/3:32:19 PM    Final     Assessment & Plan:   Problem List Items Addressed This Visit     Pancytopenia ([place])    Etiology unclear: drug effect vs BM issue.  Continue follow up with Dr  Janese Banks.  Lab Results  Component Value Date   WBC 5.7 04/21/2022   HGB 10.9 (L) 04/21/2022   HCT 30.6 (L) 04/21/2022   MCV 115.2 Repeated and verified X2. (H) 04/21/2022   PLT 145.0 (L) 04/21/2022        Relevant Orders   B12 and Folate Panel   Iron deficiency anemia    Tolerating supplementation with ferrous gluconate       Relevant Orders   CBC with Differential/Platelet   Insomnia due to stress    Inadequate sleep with current dose of trazodone. Encouraged to try adding relaxium       Hyperlipidemia   Relevant Orders   Lipid Profile   Direct LDL   Essential hypertension - Primary   Relevant Orders   Comp Met (CMET)   Urine Microalbumin w/creat. ratio   Other Visit Diagnoses     Need for immunization against influenza       Relevant Orders   Flu Vaccine QUAD High Dose(Fluad) (Completed)       Follow-up: Return in about 6 months (around 01/13/2023).   [name], MD

## 2022-07-15 NOTE — Assessment & Plan Note (Signed)
Tolerating supplementation with ferrous gluconate

## 2022-07-15 NOTE — Patient Instructions (Addendum)
It was nice seeing you today!  Your right hand wound does not appear to be infected.  Keeping it covered and changing your bandage whenever it gets wet is the key to keeping it that way  For your insomnia:   Continue 1/2 tablet of trazodone you can increase the melatonin to 6 mg OR:    You CAN TRY ADDING   Relaxium for insomnia  (as seen on TV commercials) . It is available through [employer] and contains all natural supplements:  Melatonin 5 mg  Chamomile 25 mg Passionflower extract 75 mg GABA 100 mg Ashwaganda extract 125 mg Magnesium citrate, glycinate, oxide (100 mg)  L tryptophan 500 mg Valerest (proprietary  ingredient ; probably valeria root extract)

## 2022-07-15 NOTE — Assessment & Plan Note (Signed)
Etiology unclear: drug effect vs BM issue.  Continue follow up with Dr Janese Banks.  Lab Results  Component Value Date   WBC 5.7 04/21/2022   HGB 10.9 (L) 04/21/2022   HCT 30.6 (L) 04/21/2022   MCV 115.2 Repeated and verified X2. (H) 04/21/2022   PLT 145.0 (L) 04/21/2022

## 2022-07-15 NOTE — Assessment & Plan Note (Signed)
Inadequate sleep with current dose of trazodone. Encouraged to try adding relaxium

## 2022-07-16 DIAGNOSIS — Z79899 Other long term (current) drug therapy: Secondary | ICD-10-CM | POA: Diagnosis not present

## 2022-07-16 DIAGNOSIS — L931 Subacute cutaneous lupus erythematosus: Secondary | ICD-10-CM | POA: Diagnosis not present

## 2022-07-16 LAB — COMPREHENSIVE METABOLIC PANEL
ALT: 7 U/L (ref 0–35)
AST: 16 U/L (ref 0–37)
Albumin: 4 g/dL (ref 3.5–5.2)
Alkaline Phosphatase: 66 U/L (ref 39–117)
BUN: 21 mg/dL (ref 6–23)
CO2: 32 mEq/L (ref 19–32)
Calcium: 8.6 mg/dL (ref 8.4–10.5)
Chloride: 100 mEq/L (ref 96–112)
Creatinine, Ser: 0.79 mg/dL (ref 0.40–1.20)
GFR: 68.16 mL/min (ref >60.00)
Glucose, Bld: 105 mg/dL — ABNORMAL HIGH (ref 70–99)
Potassium: 4.5 mEq/L (ref 3.5–5.1)
Sodium: 136 mEq/L (ref 135–145)
Total Bilirubin: 0.7 mg/dL (ref 0.2–1.2)
Total Protein: 5.9 g/dL — ABNORMAL LOW (ref 6.0–8.3)

## 2022-07-16 LAB — B12 AND FOLATE PANEL
Folate: >23.8 ng/mL (ref >5.9)
Vitamin B-12: 771 pg/mL (ref 211–911)

## 2022-07-16 LAB — CBC WITH DIFFERENTIAL/PLATELET
Basophils Absolute: 0 10*3/uL (ref 0.0–0.1)
Basophils Relative: 0.6 % (ref 0.0–3.0)
Eosinophils Absolute: 0.1 10*3/uL (ref 0.0–0.7)
Eosinophils Relative: 1.4 % (ref 0.0–5.0)
HCT: 29.6 % — ABNORMAL LOW (ref 36.0–46.0)
Hemoglobin: 10.3 g/dL — ABNORMAL LOW (ref 12.0–15.0)
Lymphocytes Relative: 20.6 % (ref 12.0–46.0)
Lymphs Abs: 1 10*3/uL (ref 0.7–4.0)
MCHC: 34.8 g/dL (ref 30.0–36.0)
MCV: 111.5 fl — ABNORMAL HIGH (ref 78.0–100.0)
Monocytes Absolute: 0.3 10*3/uL (ref 0.1–1.0)
Monocytes Relative: 5.9 % (ref 3.0–12.0)
Neutro Abs: 3.6 10*3/uL (ref 1.4–7.7)
Neutrophils Relative %: 71.5 % (ref 43.0–77.0)
Platelets: 145 10*3/uL — ABNORMAL LOW (ref 150.0–400.0)
RBC: 2.66 Mil/uL — ABNORMAL LOW (ref 3.87–5.11)
RDW: 17.2 % — ABNORMAL HIGH (ref 11.5–15.5)
WBC: 5 10*3/uL (ref 4.0–10.5)

## 2022-07-16 LAB — LIPID PANEL
Cholesterol: 95 mg/dL (ref 0–200)
HDL: 42.5 mg/dL (ref >39.00)
LDL Cholesterol: 39 mg/dL (ref 0–99)
NonHDL: 52.34
Total CHOL/HDL Ratio: 2
Triglycerides: 66 mg/dL (ref 0.0–149.0)
VLDL: 13.2 mg/dL (ref 0.0–40.0)

## 2022-07-16 LAB — MICROALBUMIN / CREATININE URINE RATIO
Creatinine,U: 40.4 mg/dL
Microalb Creat Ratio: 2.6 mg/g (ref 0.0–30.0)
Microalb, Ur: 1 mg/dL (ref 0.0–1.9)

## 2022-07-16 LAB — LDL CHOLESTEROL, DIRECT: Direct LDL: 48 mg/dL

## 2022-07-25 ENCOUNTER — Other Ambulatory Visit: Payer: Self-pay | Admitting: Internal Medicine

## 2022-07-28 ENCOUNTER — Encounter: Payer: Self-pay | Admitting: Oncology

## 2022-07-28 ENCOUNTER — Other Ambulatory Visit: Payer: PPO

## 2022-07-28 ENCOUNTER — Inpatient Hospital Stay: Payer: PPO | Attending: Oncology | Admitting: Oncology

## 2022-07-28 VITALS — BP 130/62 | HR 78 | Temp 98.2°F | Resp 18 | Wt 144.4 lb

## 2022-07-28 DIAGNOSIS — D7589 Other specified diseases of blood and blood-forming organs: Secondary | ICD-10-CM | POA: Diagnosis not present

## 2022-07-28 DIAGNOSIS — D61818 Other pancytopenia: Secondary | ICD-10-CM | POA: Diagnosis not present

## 2022-07-28 NOTE — Progress Notes (Signed)
Hematology/Oncology Consult note [facility]  Telephone:([phone] [phone]  Patient Care Team: [name], MD as PCP - General (Internal Medicine) [name], MD (Internal Medicine) Bary Castilla Forest Gleason, MD (General Surgery) Oneta Rack, MD (Dermatology)   Name of the patient: Kim Santiago  [id number]  02/19/1935   Date of visit: 07/28/22  Diagnosis-pancytopenia autoimmune versus possible MDS  Chief complaint/ Reason for visit-routine follow-up of pancytopenia  Heme/Onc history: patient is a 87 year old female with a past medical history significant for lupus on methotrexate, hypothyroidism, history of CVA among other medical problems.  She has been on longstanding methotrexate.She has been referred for pancytopenia.  Her white cell count has been fluctuating between 2.3-4 over the last 4 years.  Platelet count stable in the 120s and hemoglobin has been mostly between 10-12 dating back to December 2019.  In the past she has had mild macrocytosis with an MCV of around 100 but MCV has been getting progressively higher in the last 6 months.Differential has shown mild neutropenia with an [place] that fluctuates between 1.3-2.5. Recent iron studies from February showed elevated ferritin of 363 with a normal serum iron.  RBC folate normal at 993.   Results of blood work from 01/06/2022 were as follows: CBC showed white cell count of 2.6 with an ANC of 1.2, H&H of 10.7/30.1 107.9 MCV and a platelet count of 132.  B12 and folate were normal.  Ferritin levels were elevated at 384.  Reticulocyte count slightly low at 2% for the degree of anemia.  Myeloma panel showed no M protein serum free light chain normal.  Haptoglobin and TSH normal.  Interval history-patient is doing well for her age.  She is on methotrexate for her lupus and symptoms are under good control.  No recent hospitalizations.  Appetite and weight have remained stable.  ECOG PS-  1 Pain scale- 0   Review of systems- Review of Systems  Constitutional:  Negative for chills, fever, malaise/fatigue and weight loss.  HENT:  Negative for congestion, ear discharge and nosebleeds.   Eyes:  Negative for blurred vision.  Respiratory:  Negative for cough, hemoptysis, sputum production, shortness of breath and wheezing.   Cardiovascular:  Negative for chest pain, palpitations, orthopnea and claudication.  Gastrointestinal:  Negative for abdominal pain, blood in stool, constipation, diarrhea, heartburn, melena, nausea and vomiting.  Genitourinary:  Negative for dysuria, flank pain, frequency, hematuria and urgency.  Musculoskeletal:  Negative for back pain, joint pain and myalgias.  Skin:  Negative for rash.  Neurological:  Negative for dizziness, tingling, focal weakness, seizures, weakness and headaches.  Endo/Heme/Allergies:  Does not bruise/bleed easily.  Psychiatric/Behavioral:  Negative for depression and suicidal ideas. The patient does not have insomnia.       Allergies  Allergen Reactions   Azathioprine Nausea And Vomiting    Severe vomiting   Hydroxychloroquine Hives and Nausea And Vomiting   Mycophenolate Mofetil Nausea Only   Amoxicillin Rash    Has patient had a PCN reaction causing immediate rash, facial/tongue/throat swelling, SOB or lightheadedness with hypotension: Unknown Has patient had a PCN reaction causing severe rash involving mucus membranes or skin necrosis: Unknown Has patient had a PCN reaction that required hospitalization: Unknown Has patient had a PCN reaction occurring within the last 10 years: Unknown If all of the above answers are "NO", then may proceed with Cephalosporin use. Tolerates ceftriaxone and Ancef   Codeine Nausea And Vomiting   Naprosyn [Naproxen] Swelling  Orudis [Ketoprofen] Hives   Sulfa Antibiotics Rash   Sulfathiazole Rash     Past Medical History:  Diagnosis Date   Arthritis    Breast cancer ([place]) 2017    left mastectomy done 11/2015   Breast cancer in female [facility]) 11/18/2015   Left: 3.9 cm tumor, T2, 1/2 sentinel nodes positive for macro metastatic disease, N1, 3 negative nodes in the axillary tail, ER+,PR+, Her 2 neu, low Mammoprint score   Cancer (HCC)    thyroid takes levothyroxine   CAP (community acquired pneumonia) 05/05/2021   Chronic kidney disease    UTI   Genetic screening 11/2015   Mammoprint of left breast cancer: Low risk for recurrence.    History of heart attack 06/12/2014   Hypertension    hypothyroidism    secondary to thyroidectomy for thyroid ca   Hypothyroidism    Lupus (HCC)    subcutaneous   Menopause 40s   natural, hot flashes and mood lability now gone, off prempro 7 months   Myocardial infarction [facility]) 2013   Osteoporosis    Osteopenia   Rosacea    Sjoegren syndrome    Stress-induced cardiomyopathy September of 2013   EF 35%. Peak troponin was 1.8.   Stroke [facility]) 10/2018     Past Surgical History:  Procedure Laterality Date   BACK SURGERY     BREAST BIOPSY Left 10/30/15   positive, done in Dr. Dwyane Luo office   CARDIAC CATHETERIZATION  05/2012   [place]. No significant CAD. Ejection fraction of 35% due to stress-induced cardiomyopathy.   CHOLECYSTECTOMY     COLONOSCOPY     DILATION AND CURETTAGE OF UTERUS     KYPHOSIS SURGERY  Feb 2008   L1, Dr. Mauri Pole   LUMBAR DISC SURGERY     L4-L5   MASTECTOMY Left 11/18/2015   positive   SENTINEL NODE BIOPSY Left 11/18/2015   Procedure: SENTINEL NODE BIOPSY;  Surgeon: Robert Bellow, MD;  Location: [facility] ORS;  Service: General;  Laterality: Left;   SHOULDER ARTHROSCOPY  2004   Left, Dr. Jefm Bryant   SIMPLE MASTECTOMY WITH AXILLARY SENTINEL NODE BIOPSY Left 11/18/2015   Procedure: SIMPLE MASTECTOMY;  Surgeon: Robert Bellow, MD;  Location: [facility] ORS;  Service: General;  Laterality: Left;   SPINE SURGERY     L4-5 diskectomy   THYROIDECTOMY     Thyroid Cancer   TONSILLECTOMY     TUBAL LIGATION       Social History   Socioeconomic History   Marital status: Widowed    Spouse name: Not on file   Number of children: 1   Years of education: college   Highest education level: Not on file  Occupational History   Not on file  Tobacco Use   Smoking status: Never   Smokeless tobacco: Never  Vaping Use   Vaping Use: Never used  Substance and Sexual Activity   Alcohol use: No   Drug use: No   Sexual activity: Not Currently  Other Topics Concern   Not on file  Social History Narrative   Has 1 adopted daughter.   Social Determinants of Health   Financial Resource Strain: Low Risk  (04/13/2022)   Overall Financial Resource Strain (CARDIA)    Difficulty of Paying Living Expenses: Not hard at all  Food Insecurity: No Food Insecurity (04/13/2022)   Hunger Vital Sign    Worried About Running Out of Food in the Last Year: Never true    Ran Out of Food in the  Last Year: Never true  Transportation Needs: No Transportation Needs (04/13/2022)   PRAPARE - Hydrologist (Medical): No    Lack of Transportation (Non-Medical): No  Physical Activity: Insufficiently Active (04/13/2022)   Exercise Vital Sign    Days of Exercise per Week: 3 days    Minutes of Exercise per Session: 30 min  Stress: No Stress Concern Present (04/13/2022)   [place]    Feeling of Stress : Not at all  Social Connections: Unknown (04/13/2022)   Social Connection and Isolation Panel [NHANES]    Frequency of Communication with Friends and Family: More than three times a week    Frequency of Social Gatherings with Friends and Family: More than three times a week    Attends Religious Services: Not on file    Active Member of Clubs or Organizations: Not on file    Attends Archivist Meetings: Not on file    Marital Status: Widowed  Intimate Partner Violence: Not At Risk (04/13/2022)   Humiliation, Afraid, Rape, and Kick  questionnaire    Fear of Current or Ex-Partner: No    Emotionally Abused: No    Physically Abused: No    Sexually Abused: No    Family History  Problem Relation Age of Onset   Kidney disease Mother    Heart disease Mother    COPD Father    Cancer Father        esophageal   Kidney disease Sister    Cancer Brother 42       colon cancer (both brothers)   Cancer Brother    Heart attack Brother 22   Heart disease Brother    Breast cancer Neg Hx      Current Outpatient Medications:    atorvastatin (LIPITOR) 20 MG tablet, TAKE 1 TABLET BY MOUTH EVERY DAY AT 6 PM, Disp: 90 tablet, Rfl: 3   Calcium Carbonate-Vitamin D 600-200 MG-UNIT TABS, Take 2 tablets by mouth daily., Disp: , Rfl:    CRANBERRY PO, Take 1 capsule by mouth 2 (two) times daily., Disp: , Rfl:    ferrous gluconate (FERGON) 324 MG tablet, TAKE 1 TABLET BY MOUTH DAILY WITH BREAKFAST, Disp: 90 tablet, Rfl: 1   folic acid (FOLVITE) 1 MG tablet, Take 2 mg by mouth daily., Disp: , Rfl:    furosemide (LASIX) 20 MG tablet, TAKE 1 TABLET BY MOUTH EVERY OTHER DAY AS NEEDED, Disp: 45 tablet, Rfl: 2   levothyroxine (SYNTHROID) 75 MCG tablet, TAKE 1 TABLET BY MOUTH (75MCG TOTAL) DAILY, Disp: 90 tablet, Rfl: 2   losartan (COZAAR) 25 MG tablet, TAKE 1 TABLET BY MOUTH EVERY DAY, Disp: 90 tablet, Rfl: 1   methotrexate (RHEUMATREX) 2.5 MG tablet, Take 15 mg by mouth once a week., Disp: , Rfl:    metolazone (ZAROXOLYN) 2.5 MG tablet, Take 2.5 mg by mouth every other day., Disp: , Rfl:    metoprolol succinate (TOPROL-XL) 25 MG 24 hr tablet, TAKE 1 TABLET BY MOUTH EVERY DAY, Disp: 90 tablet, Rfl: 3   omeprazole (PRILOSEC) 20 MG capsule, TAKE 1 CAPSULE (20 MG TOTAL) BY MOUTH 2 (TWO) TIMES DAILY BEFORE A MEAL., Disp: 180 capsule, Rfl: 1   polyethylene glycol (MIRALAX / GLYCOLAX) packet, Take 17 g by mouth daily., Disp: , Rfl:    predniSONE (DELTASONE) 5 MG tablet, Take 1 tablet by mouth daily., Disp: , Rfl:    Probiotic Product (PROBIOTIC PO),  Take 1  tablet by mouth daily., Disp: , Rfl:    sertraline (ZOLOFT) 25 MG tablet, TAKE 1 TABLET (25 MG TOTAL) BY MOUTH DAILY., Disp: 90 tablet, Rfl: 1   sodium fluoride (PREVIDENT 5000 PLUS) 1.1 % CREA dental cream, Take 1 application by mouth daily. , Disp: , Rfl:    triamcinolone cream (KENALOG) 0.1 %, APPLY TO AFFECTED AREA TWICE A DAY, Disp: , Rfl:    cholecalciferol (VITAMIN D) 1000 UNITS tablet, Take 2,000 Units by mouth daily., Disp: , Rfl:    Melatonin 10 MG TABS, Take 1 tablet by mouth daily at 2 am. (Patient not taking: Reported on 07/28/2022), Disp: , Rfl:    nitroGLYCERIN (NITROSTAT) 0.4 MG SL tablet, Place 1 tablet (0.4 mg total) under the tongue every 5 (five) minutes as needed for chest pain. Maximum 3 doses (Patient not taking: Reported on 07/28/2022), Disp: 50 tablet, Rfl: 3   spironolactone (ALDACTONE) 25 MG tablet, Take 1 tablet by mouth every other day. (Patient not taking: Reported on 07/28/2022), Disp: , Rfl:    tizanidine (ZANAFLEX) 2 MG capsule, Take 2 mg by mouth 3 (three) times daily as needed for muscle spasms. (Patient not taking: Reported on 07/28/2022), Disp: , Rfl:    traZODone (DESYREL) 50 MG tablet, Take 50 mg by mouth at bedtime. (Patient not taking: Reported on 07/28/2022), Disp: , Rfl:   Physical exam:  Vitals:   07/28/22 1110  BP: 130/62  Pulse: 78  Resp: 18  Temp: 98.2 F (36.8 C)  SpO2: 100%  Weight: 144 lb 6.4 oz (65.5 kg)   Physical Exam Constitutional:      General: She is not in acute distress. Cardiovascular:     Rate and Rhythm: Normal rate and regular rhythm.     Heart sounds: Normal heart sounds.  Pulmonary:     Effort: Pulmonary effort is normal.     Breath sounds: Normal breath sounds.  Abdominal:     General: Bowel sounds are normal.     Palpations: Abdomen is soft.  Skin:    General: Skin is warm and dry.  Neurological:     Mental Status: She is alert and oriented to person, place, and time.         Latest Ref Rng & Units  07/15/2022    2:57 PM  CMP  Glucose 70 - 99 mg/dL 105   BUN 6 - 23 mg/dL 21   Creatinine 0.40 - 1.20 mg/dL 0.79   Sodium 135 - 145 mEq/L 136   Potassium 3.5 - 5.1 mEq/L 4.5   Chloride 96 - 112 mEq/L 100   CO2 19 - 32 mEq/L 32   Calcium 8.4 - 10.5 mg/dL 8.6   Total Protein 6.0 - 8.3 g/dL 5.9   Total Bilirubin 0.2 - 1.2 mg/dL 0.7   Alkaline Phos 39 - 117 U/L 66   AST 0 - 37 U/L 16   ALT 0 - 35 U/L 7       Latest Ref Rng & Units 07/15/2022    2:57 PM  CBC  WBC 4.0 - 10.5 K/uL 5.0   Hemoglobin 12.0 - 15.0 g/dL 10.3   Hematocrit 36.0 - 46.0 % 29.6   Platelets 150.0 - 400.0 K/uL 145.0      Assessment and plan- Patient is a 87 y.o. female who is here for routine follow-up of pancytopenia  Patient has had longstanding panCytopenia that has remained stable for the last 3 to 4 years.  White cell count fluctuates between 3-5 with  occasional mild neutropenia.  Hemoglobin has remained stable between 10-11 with persistent macrocytosis.  Platelets have been borderline low between 1 20-1 50.  Given the stability of her counts she does not require a bone marrow biopsy at this time.  Etiology is possibly autoimmune versus component of MDS given the macrocytosis and her age.  However given the fact that her counts have remained stable she does not require a bone marrow biopsy at this time.  I will repeat CBC with differential in 6 months in 1 year and see her back in 1 year   Visit Diagnosis 1. Pancytopenia ([place])      Dr. Randa Evens, MD, MPH [facility] [phone] 07/28/2022 4:05 PM

## 2022-08-14 LAB — HISTOPLASMA ANTIGEN, URINE: Histoplasma Antigen, urine: <0.5 (ref <0.5)

## 2022-08-25 DIAGNOSIS — F3342 Major depressive disorder, recurrent, in full remission: Secondary | ICD-10-CM | POA: Diagnosis not present

## 2022-08-25 DIAGNOSIS — Z6823 Body mass index (BMI) 23.0-23.9, adult: Secondary | ICD-10-CM | POA: Diagnosis not present

## 2022-08-25 DIAGNOSIS — Z515 Encounter for palliative care: Secondary | ICD-10-CM | POA: Diagnosis not present

## 2022-08-25 DIAGNOSIS — E261 Secondary hyperaldosteronism: Secondary | ICD-10-CM | POA: Diagnosis not present

## 2022-08-25 DIAGNOSIS — I509 Heart failure, unspecified: Secondary | ICD-10-CM | POA: Diagnosis not present

## 2022-08-26 DIAGNOSIS — R5381 Other malaise: Secondary | ICD-10-CM | POA: Diagnosis not present

## 2022-09-01 DIAGNOSIS — I10 Essential (primary) hypertension: Secondary | ICD-10-CM | POA: Diagnosis not present

## 2022-09-01 DIAGNOSIS — R5381 Other malaise: Secondary | ICD-10-CM | POA: Diagnosis not present

## 2022-09-01 DIAGNOSIS — R0600 Dyspnea, unspecified: Secondary | ICD-10-CM | POA: Diagnosis not present

## 2022-09-08 DIAGNOSIS — R0600 Dyspnea, unspecified: Secondary | ICD-10-CM | POA: Diagnosis not present

## 2022-09-08 DIAGNOSIS — R5381 Other malaise: Secondary | ICD-10-CM | POA: Diagnosis not present

## 2022-09-08 DIAGNOSIS — I10 Essential (primary) hypertension: Secondary | ICD-10-CM | POA: Diagnosis not present

## 2022-09-10 DIAGNOSIS — L819 Disorder of pigmentation, unspecified: Secondary | ICD-10-CM | POA: Diagnosis not present

## 2022-09-10 DIAGNOSIS — R5381 Other malaise: Secondary | ICD-10-CM | POA: Diagnosis not present

## 2022-09-10 DIAGNOSIS — Z9181 History of falling: Secondary | ICD-10-CM | POA: Diagnosis not present

## 2022-09-10 DIAGNOSIS — R059 Cough, unspecified: Secondary | ICD-10-CM | POA: Diagnosis not present

## 2022-09-10 DIAGNOSIS — H5789 Other specified disorders of eye and adnexa: Secondary | ICD-10-CM | POA: Diagnosis not present

## 2022-09-10 DIAGNOSIS — I130 Hypertensive heart and chronic kidney disease with heart failure and stage 1 through stage 4 chronic kidney disease, or unspecified chronic kidney disease: Secondary | ICD-10-CM | POA: Diagnosis not present

## 2022-09-10 DIAGNOSIS — K219 Gastro-esophageal reflux disease without esophagitis: Secondary | ICD-10-CM | POA: Diagnosis not present

## 2022-09-10 DIAGNOSIS — E039 Hypothyroidism, unspecified: Secondary | ICD-10-CM | POA: Diagnosis not present

## 2022-09-10 DIAGNOSIS — G4700 Insomnia, unspecified: Secondary | ICD-10-CM | POA: Diagnosis not present

## 2022-09-10 DIAGNOSIS — Z9012 Acquired absence of left breast and nipple: Secondary | ICD-10-CM | POA: Diagnosis not present

## 2022-09-10 DIAGNOSIS — Z7952 Long term (current) use of systemic steroids: Secondary | ICD-10-CM | POA: Diagnosis not present

## 2022-09-10 DIAGNOSIS — K589 Irritable bowel syndrome without diarrhea: Secondary | ICD-10-CM | POA: Diagnosis not present

## 2022-09-10 DIAGNOSIS — D631 Anemia in chronic kidney disease: Secondary | ICD-10-CM | POA: Diagnosis not present

## 2022-09-10 DIAGNOSIS — J9811 Atelectasis: Secondary | ICD-10-CM | POA: Diagnosis not present

## 2022-09-10 DIAGNOSIS — I252 Old myocardial infarction: Secondary | ICD-10-CM | POA: Diagnosis not present

## 2022-09-10 DIAGNOSIS — K118 Other diseases of salivary glands: Secondary | ICD-10-CM | POA: Diagnosis not present

## 2022-09-10 DIAGNOSIS — I5042 Chronic combined systolic (congestive) and diastolic (congestive) heart failure: Secondary | ICD-10-CM | POA: Diagnosis not present

## 2022-09-10 DIAGNOSIS — M329 Systemic lupus erythematosus, unspecified: Secondary | ICD-10-CM | POA: Diagnosis not present

## 2022-09-10 DIAGNOSIS — N183 Chronic kidney disease, stage 3 unspecified: Secondary | ICD-10-CM | POA: Diagnosis not present

## 2022-09-10 DIAGNOSIS — M1990 Unspecified osteoarthritis, unspecified site: Secondary | ICD-10-CM | POA: Diagnosis not present

## 2022-09-10 DIAGNOSIS — E785 Hyperlipidemia, unspecified: Secondary | ICD-10-CM | POA: Diagnosis not present

## 2022-09-10 DIAGNOSIS — Z853 Personal history of malignant neoplasm of breast: Secondary | ICD-10-CM | POA: Diagnosis not present

## 2022-09-10 DIAGNOSIS — U099 Post covid-19 condition, unspecified: Secondary | ICD-10-CM | POA: Diagnosis not present

## 2022-09-10 DIAGNOSIS — M3500 Sicca syndrome, unspecified: Secondary | ICD-10-CM | POA: Diagnosis not present

## 2022-09-24 DIAGNOSIS — K118 Other diseases of salivary glands: Secondary | ICD-10-CM | POA: Diagnosis not present

## 2022-09-24 DIAGNOSIS — D631 Anemia in chronic kidney disease: Secondary | ICD-10-CM | POA: Diagnosis not present

## 2022-09-24 DIAGNOSIS — H5789 Other specified disorders of eye and adnexa: Secondary | ICD-10-CM | POA: Diagnosis not present

## 2022-09-24 DIAGNOSIS — I5042 Chronic combined systolic (congestive) and diastolic (congestive) heart failure: Secondary | ICD-10-CM | POA: Diagnosis not present

## 2022-09-24 DIAGNOSIS — L819 Disorder of pigmentation, unspecified: Secondary | ICD-10-CM | POA: Diagnosis not present

## 2022-09-24 DIAGNOSIS — R059 Cough, unspecified: Secondary | ICD-10-CM | POA: Diagnosis not present

## 2022-09-24 DIAGNOSIS — Z9012 Acquired absence of left breast and nipple: Secondary | ICD-10-CM | POA: Diagnosis not present

## 2022-09-24 DIAGNOSIS — U099 Post covid-19 condition, unspecified: Secondary | ICD-10-CM | POA: Diagnosis not present

## 2022-09-24 DIAGNOSIS — E039 Hypothyroidism, unspecified: Secondary | ICD-10-CM | POA: Diagnosis not present

## 2022-09-24 DIAGNOSIS — M329 Systemic lupus erythematosus, unspecified: Secondary | ICD-10-CM | POA: Diagnosis not present

## 2022-09-24 DIAGNOSIS — E785 Hyperlipidemia, unspecified: Secondary | ICD-10-CM | POA: Diagnosis not present

## 2022-09-24 DIAGNOSIS — N183 Chronic kidney disease, stage 3 unspecified: Secondary | ICD-10-CM | POA: Diagnosis not present

## 2022-09-24 DIAGNOSIS — G4700 Insomnia, unspecified: Secondary | ICD-10-CM | POA: Diagnosis not present

## 2022-09-24 DIAGNOSIS — Z853 Personal history of malignant neoplasm of breast: Secondary | ICD-10-CM | POA: Diagnosis not present

## 2022-09-24 DIAGNOSIS — K219 Gastro-esophageal reflux disease without esophagitis: Secondary | ICD-10-CM | POA: Diagnosis not present

## 2022-09-24 DIAGNOSIS — I130 Hypertensive heart and chronic kidney disease with heart failure and stage 1 through stage 4 chronic kidney disease, or unspecified chronic kidney disease: Secondary | ICD-10-CM | POA: Diagnosis not present

## 2022-09-24 DIAGNOSIS — J9811 Atelectasis: Secondary | ICD-10-CM | POA: Diagnosis not present

## 2022-09-24 DIAGNOSIS — I252 Old myocardial infarction: Secondary | ICD-10-CM | POA: Diagnosis not present

## 2022-09-24 DIAGNOSIS — R5381 Other malaise: Secondary | ICD-10-CM | POA: Diagnosis not present

## 2022-09-24 DIAGNOSIS — K589 Irritable bowel syndrome without diarrhea: Secondary | ICD-10-CM | POA: Diagnosis not present

## 2022-09-24 DIAGNOSIS — Z7952 Long term (current) use of systemic steroids: Secondary | ICD-10-CM | POA: Diagnosis not present

## 2022-09-24 DIAGNOSIS — Z9181 History of falling: Secondary | ICD-10-CM | POA: Diagnosis not present

## 2022-09-24 DIAGNOSIS — M3500 Sicca syndrome, unspecified: Secondary | ICD-10-CM | POA: Diagnosis not present

## 2022-09-24 DIAGNOSIS — M1990 Unspecified osteoarthritis, unspecified site: Secondary | ICD-10-CM | POA: Diagnosis not present

## 2022-09-27 ENCOUNTER — Other Ambulatory Visit: Payer: Self-pay | Admitting: Internal Medicine

## 2022-09-27 DIAGNOSIS — E034 Atrophy of thyroid (acquired): Secondary | ICD-10-CM

## 2022-09-29 DIAGNOSIS — I5042 Chronic combined systolic (congestive) and diastolic (congestive) heart failure: Secondary | ICD-10-CM | POA: Diagnosis not present

## 2022-09-29 DIAGNOSIS — M329 Systemic lupus erythematosus, unspecified: Secondary | ICD-10-CM | POA: Diagnosis not present

## 2022-09-29 DIAGNOSIS — K589 Irritable bowel syndrome without diarrhea: Secondary | ICD-10-CM | POA: Diagnosis not present

## 2022-09-29 DIAGNOSIS — J9811 Atelectasis: Secondary | ICD-10-CM | POA: Diagnosis not present

## 2022-09-29 DIAGNOSIS — E785 Hyperlipidemia, unspecified: Secondary | ICD-10-CM | POA: Diagnosis not present

## 2022-09-29 DIAGNOSIS — L819 Disorder of pigmentation, unspecified: Secondary | ICD-10-CM | POA: Diagnosis not present

## 2022-09-29 DIAGNOSIS — Z9181 History of falling: Secondary | ICD-10-CM | POA: Diagnosis not present

## 2022-09-29 DIAGNOSIS — D631 Anemia in chronic kidney disease: Secondary | ICD-10-CM | POA: Diagnosis not present

## 2022-09-29 DIAGNOSIS — R5381 Other malaise: Secondary | ICD-10-CM | POA: Diagnosis not present

## 2022-09-29 DIAGNOSIS — G4700 Insomnia, unspecified: Secondary | ICD-10-CM | POA: Diagnosis not present

## 2022-09-29 DIAGNOSIS — N183 Chronic kidney disease, stage 3 unspecified: Secondary | ICD-10-CM | POA: Diagnosis not present

## 2022-09-29 DIAGNOSIS — K118 Other diseases of salivary glands: Secondary | ICD-10-CM | POA: Diagnosis not present

## 2022-09-29 DIAGNOSIS — M1990 Unspecified osteoarthritis, unspecified site: Secondary | ICD-10-CM | POA: Diagnosis not present

## 2022-09-29 DIAGNOSIS — R059 Cough, unspecified: Secondary | ICD-10-CM | POA: Diagnosis not present

## 2022-09-29 DIAGNOSIS — M3500 Sicca syndrome, unspecified: Secondary | ICD-10-CM | POA: Diagnosis not present

## 2022-09-29 DIAGNOSIS — E039 Hypothyroidism, unspecified: Secondary | ICD-10-CM | POA: Diagnosis not present

## 2022-09-29 DIAGNOSIS — H5789 Other specified disorders of eye and adnexa: Secondary | ICD-10-CM | POA: Diagnosis not present

## 2022-09-29 DIAGNOSIS — U099 Post covid-19 condition, unspecified: Secondary | ICD-10-CM | POA: Diagnosis not present

## 2022-09-29 DIAGNOSIS — K219 Gastro-esophageal reflux disease without esophagitis: Secondary | ICD-10-CM | POA: Diagnosis not present

## 2022-09-29 DIAGNOSIS — I252 Old myocardial infarction: Secondary | ICD-10-CM | POA: Diagnosis not present

## 2022-09-29 DIAGNOSIS — Z9012 Acquired absence of left breast and nipple: Secondary | ICD-10-CM | POA: Diagnosis not present

## 2022-09-29 DIAGNOSIS — Z853 Personal history of malignant neoplasm of breast: Secondary | ICD-10-CM | POA: Diagnosis not present

## 2022-09-29 DIAGNOSIS — I130 Hypertensive heart and chronic kidney disease with heart failure and stage 1 through stage 4 chronic kidney disease, or unspecified chronic kidney disease: Secondary | ICD-10-CM | POA: Diagnosis not present

## 2022-09-29 DIAGNOSIS — Z7952 Long term (current) use of systemic steroids: Secondary | ICD-10-CM | POA: Diagnosis not present

## 2022-09-30 DIAGNOSIS — N183 Chronic kidney disease, stage 3 unspecified: Secondary | ICD-10-CM | POA: Diagnosis not present

## 2022-09-30 DIAGNOSIS — D631 Anemia in chronic kidney disease: Secondary | ICD-10-CM | POA: Diagnosis not present

## 2022-09-30 DIAGNOSIS — I5042 Chronic combined systolic (congestive) and diastolic (congestive) heart failure: Secondary | ICD-10-CM | POA: Diagnosis not present

## 2022-09-30 DIAGNOSIS — M329 Systemic lupus erythematosus, unspecified: Secondary | ICD-10-CM | POA: Diagnosis not present

## 2022-09-30 DIAGNOSIS — I130 Hypertensive heart and chronic kidney disease with heart failure and stage 1 through stage 4 chronic kidney disease, or unspecified chronic kidney disease: Secondary | ICD-10-CM | POA: Diagnosis not present

## 2022-09-30 DIAGNOSIS — R5381 Other malaise: Secondary | ICD-10-CM | POA: Diagnosis not present

## 2022-09-30 DIAGNOSIS — M3500 Sicca syndrome, unspecified: Secondary | ICD-10-CM | POA: Diagnosis not present

## 2022-10-14 DIAGNOSIS — J9811 Atelectasis: Secondary | ICD-10-CM | POA: Diagnosis not present

## 2022-10-14 DIAGNOSIS — K118 Other diseases of salivary glands: Secondary | ICD-10-CM | POA: Diagnosis not present

## 2022-10-14 DIAGNOSIS — K589 Irritable bowel syndrome without diarrhea: Secondary | ICD-10-CM | POA: Diagnosis not present

## 2022-10-14 DIAGNOSIS — E785 Hyperlipidemia, unspecified: Secondary | ICD-10-CM | POA: Diagnosis not present

## 2022-10-14 DIAGNOSIS — Z7952 Long term (current) use of systemic steroids: Secondary | ICD-10-CM | POA: Diagnosis not present

## 2022-10-14 DIAGNOSIS — Z9012 Acquired absence of left breast and nipple: Secondary | ICD-10-CM | POA: Diagnosis not present

## 2022-10-14 DIAGNOSIS — N183 Chronic kidney disease, stage 3 unspecified: Secondary | ICD-10-CM | POA: Diagnosis not present

## 2022-10-14 DIAGNOSIS — M329 Systemic lupus erythematosus, unspecified: Secondary | ICD-10-CM | POA: Diagnosis not present

## 2022-10-14 DIAGNOSIS — R059 Cough, unspecified: Secondary | ICD-10-CM | POA: Diagnosis not present

## 2022-10-14 DIAGNOSIS — I5042 Chronic combined systolic (congestive) and diastolic (congestive) heart failure: Secondary | ICD-10-CM | POA: Diagnosis not present

## 2022-10-14 DIAGNOSIS — I252 Old myocardial infarction: Secondary | ICD-10-CM | POA: Diagnosis not present

## 2022-10-14 DIAGNOSIS — K219 Gastro-esophageal reflux disease without esophagitis: Secondary | ICD-10-CM | POA: Diagnosis not present

## 2022-10-14 DIAGNOSIS — Z9181 History of falling: Secondary | ICD-10-CM | POA: Diagnosis not present

## 2022-10-14 DIAGNOSIS — U099 Post covid-19 condition, unspecified: Secondary | ICD-10-CM | POA: Diagnosis not present

## 2022-10-14 DIAGNOSIS — E039 Hypothyroidism, unspecified: Secondary | ICD-10-CM | POA: Diagnosis not present

## 2022-10-14 DIAGNOSIS — M1990 Unspecified osteoarthritis, unspecified site: Secondary | ICD-10-CM | POA: Diagnosis not present

## 2022-10-14 DIAGNOSIS — G4700 Insomnia, unspecified: Secondary | ICD-10-CM | POA: Diagnosis not present

## 2022-10-14 DIAGNOSIS — M3500 Sicca syndrome, unspecified: Secondary | ICD-10-CM | POA: Diagnosis not present

## 2022-10-14 DIAGNOSIS — I130 Hypertensive heart and chronic kidney disease with heart failure and stage 1 through stage 4 chronic kidney disease, or unspecified chronic kidney disease: Secondary | ICD-10-CM | POA: Diagnosis not present

## 2022-10-14 DIAGNOSIS — H5789 Other specified disorders of eye and adnexa: Secondary | ICD-10-CM | POA: Diagnosis not present

## 2022-10-14 DIAGNOSIS — R5381 Other malaise: Secondary | ICD-10-CM | POA: Diagnosis not present

## 2022-10-14 DIAGNOSIS — D631 Anemia in chronic kidney disease: Secondary | ICD-10-CM | POA: Diagnosis not present

## 2022-10-14 DIAGNOSIS — L819 Disorder of pigmentation, unspecified: Secondary | ICD-10-CM | POA: Diagnosis not present

## 2022-10-14 DIAGNOSIS — Z853 Personal history of malignant neoplasm of breast: Secondary | ICD-10-CM | POA: Diagnosis not present

## 2022-10-21 DIAGNOSIS — E785 Hyperlipidemia, unspecified: Secondary | ICD-10-CM | POA: Diagnosis not present

## 2022-10-21 DIAGNOSIS — K589 Irritable bowel syndrome without diarrhea: Secondary | ICD-10-CM | POA: Diagnosis not present

## 2022-10-21 DIAGNOSIS — K219 Gastro-esophageal reflux disease without esophagitis: Secondary | ICD-10-CM | POA: Diagnosis not present

## 2022-10-21 DIAGNOSIS — R5381 Other malaise: Secondary | ICD-10-CM | POA: Diagnosis not present

## 2022-10-21 DIAGNOSIS — J9811 Atelectasis: Secondary | ICD-10-CM | POA: Diagnosis not present

## 2022-10-21 DIAGNOSIS — I5042 Chronic combined systolic (congestive) and diastolic (congestive) heart failure: Secondary | ICD-10-CM | POA: Diagnosis not present

## 2022-10-21 DIAGNOSIS — Z7952 Long term (current) use of systemic steroids: Secondary | ICD-10-CM | POA: Diagnosis not present

## 2022-10-21 DIAGNOSIS — Z853 Personal history of malignant neoplasm of breast: Secondary | ICD-10-CM | POA: Diagnosis not present

## 2022-10-21 DIAGNOSIS — I130 Hypertensive heart and chronic kidney disease with heart failure and stage 1 through stage 4 chronic kidney disease, or unspecified chronic kidney disease: Secondary | ICD-10-CM | POA: Diagnosis not present

## 2022-10-21 DIAGNOSIS — R059 Cough, unspecified: Secondary | ICD-10-CM | POA: Diagnosis not present

## 2022-10-21 DIAGNOSIS — D631 Anemia in chronic kidney disease: Secondary | ICD-10-CM | POA: Diagnosis not present

## 2022-10-21 DIAGNOSIS — M3500 Sicca syndrome, unspecified: Secondary | ICD-10-CM | POA: Diagnosis not present

## 2022-10-21 DIAGNOSIS — N183 Chronic kidney disease, stage 3 unspecified: Secondary | ICD-10-CM | POA: Diagnosis not present

## 2022-10-21 DIAGNOSIS — G4700 Insomnia, unspecified: Secondary | ICD-10-CM | POA: Diagnosis not present

## 2022-10-21 DIAGNOSIS — I252 Old myocardial infarction: Secondary | ICD-10-CM | POA: Diagnosis not present

## 2022-10-21 DIAGNOSIS — E039 Hypothyroidism, unspecified: Secondary | ICD-10-CM | POA: Diagnosis not present

## 2022-10-21 DIAGNOSIS — L819 Disorder of pigmentation, unspecified: Secondary | ICD-10-CM | POA: Diagnosis not present

## 2022-10-21 DIAGNOSIS — Z9181 History of falling: Secondary | ICD-10-CM | POA: Diagnosis not present

## 2022-10-21 DIAGNOSIS — K118 Other diseases of salivary glands: Secondary | ICD-10-CM | POA: Diagnosis not present

## 2022-10-21 DIAGNOSIS — M1990 Unspecified osteoarthritis, unspecified site: Secondary | ICD-10-CM | POA: Diagnosis not present

## 2022-10-21 DIAGNOSIS — Z9012 Acquired absence of left breast and nipple: Secondary | ICD-10-CM | POA: Diagnosis not present

## 2022-10-21 DIAGNOSIS — H5789 Other specified disorders of eye and adnexa: Secondary | ICD-10-CM | POA: Diagnosis not present

## 2022-10-21 DIAGNOSIS — M329 Systemic lupus erythematosus, unspecified: Secondary | ICD-10-CM | POA: Diagnosis not present

## 2022-10-21 DIAGNOSIS — U099 Post covid-19 condition, unspecified: Secondary | ICD-10-CM | POA: Diagnosis not present

## 2022-10-22 DIAGNOSIS — L931 Subacute cutaneous lupus erythematosus: Secondary | ICD-10-CM | POA: Diagnosis not present

## 2022-10-22 DIAGNOSIS — Z79899 Other long term (current) drug therapy: Secondary | ICD-10-CM | POA: Diagnosis not present

## 2022-10-28 DIAGNOSIS — N183 Chronic kidney disease, stage 3 unspecified: Secondary | ICD-10-CM | POA: Diagnosis not present

## 2022-10-28 DIAGNOSIS — Z9012 Acquired absence of left breast and nipple: Secondary | ICD-10-CM | POA: Diagnosis not present

## 2022-10-28 DIAGNOSIS — M1990 Unspecified osteoarthritis, unspecified site: Secondary | ICD-10-CM | POA: Diagnosis not present

## 2022-10-28 DIAGNOSIS — Z9181 History of falling: Secondary | ICD-10-CM | POA: Diagnosis not present

## 2022-10-28 DIAGNOSIS — U099 Post covid-19 condition, unspecified: Secondary | ICD-10-CM | POA: Diagnosis not present

## 2022-10-28 DIAGNOSIS — J9811 Atelectasis: Secondary | ICD-10-CM | POA: Diagnosis not present

## 2022-10-28 DIAGNOSIS — R059 Cough, unspecified: Secondary | ICD-10-CM | POA: Diagnosis not present

## 2022-10-28 DIAGNOSIS — G4700 Insomnia, unspecified: Secondary | ICD-10-CM | POA: Diagnosis not present

## 2022-10-28 DIAGNOSIS — H5789 Other specified disorders of eye and adnexa: Secondary | ICD-10-CM | POA: Diagnosis not present

## 2022-10-28 DIAGNOSIS — D631 Anemia in chronic kidney disease: Secondary | ICD-10-CM | POA: Diagnosis not present

## 2022-10-28 DIAGNOSIS — M3500 Sicca syndrome, unspecified: Secondary | ICD-10-CM | POA: Diagnosis not present

## 2022-10-28 DIAGNOSIS — E039 Hypothyroidism, unspecified: Secondary | ICD-10-CM | POA: Diagnosis not present

## 2022-10-28 DIAGNOSIS — K118 Other diseases of salivary glands: Secondary | ICD-10-CM | POA: Diagnosis not present

## 2022-10-28 DIAGNOSIS — Z7952 Long term (current) use of systemic steroids: Secondary | ICD-10-CM | POA: Diagnosis not present

## 2022-10-28 DIAGNOSIS — Z853 Personal history of malignant neoplasm of breast: Secondary | ICD-10-CM | POA: Diagnosis not present

## 2022-10-28 DIAGNOSIS — I5042 Chronic combined systolic (congestive) and diastolic (congestive) heart failure: Secondary | ICD-10-CM | POA: Diagnosis not present

## 2022-10-28 DIAGNOSIS — K219 Gastro-esophageal reflux disease without esophagitis: Secondary | ICD-10-CM | POA: Diagnosis not present

## 2022-10-28 DIAGNOSIS — R5381 Other malaise: Secondary | ICD-10-CM | POA: Diagnosis not present

## 2022-10-28 DIAGNOSIS — I130 Hypertensive heart and chronic kidney disease with heart failure and stage 1 through stage 4 chronic kidney disease, or unspecified chronic kidney disease: Secondary | ICD-10-CM | POA: Diagnosis not present

## 2022-10-28 DIAGNOSIS — M329 Systemic lupus erythematosus, unspecified: Secondary | ICD-10-CM | POA: Diagnosis not present

## 2022-10-28 DIAGNOSIS — I252 Old myocardial infarction: Secondary | ICD-10-CM | POA: Diagnosis not present

## 2022-10-28 DIAGNOSIS — K589 Irritable bowel syndrome without diarrhea: Secondary | ICD-10-CM | POA: Diagnosis not present

## 2022-10-28 DIAGNOSIS — L819 Disorder of pigmentation, unspecified: Secondary | ICD-10-CM | POA: Diagnosis not present

## 2022-10-28 DIAGNOSIS — E785 Hyperlipidemia, unspecified: Secondary | ICD-10-CM | POA: Diagnosis not present

## 2022-11-25 DIAGNOSIS — H52223 Regular astigmatism, bilateral: Secondary | ICD-10-CM | POA: Diagnosis not present

## 2022-11-25 DIAGNOSIS — H04123 Dry eye syndrome of bilateral lacrimal glands: Secondary | ICD-10-CM | POA: Diagnosis not present

## 2022-11-25 DIAGNOSIS — M3500 Sicca syndrome, unspecified: Secondary | ICD-10-CM | POA: Diagnosis not present

## 2022-11-25 DIAGNOSIS — H5203 Hypermetropia, bilateral: Secondary | ICD-10-CM | POA: Diagnosis not present

## 2022-11-25 DIAGNOSIS — H353131 Nonexudative age-related macular degeneration, bilateral, early dry stage: Secondary | ICD-10-CM | POA: Diagnosis not present

## 2022-11-25 DIAGNOSIS — H524 Presbyopia: Secondary | ICD-10-CM | POA: Diagnosis not present

## 2022-11-25 DIAGNOSIS — Z961 Presence of intraocular lens: Secondary | ICD-10-CM | POA: Diagnosis not present

## 2022-12-25 ENCOUNTER — Other Ambulatory Visit: Payer: Self-pay | Admitting: Internal Medicine

## 2023-01-05 DIAGNOSIS — H5203 Hypermetropia, bilateral: Secondary | ICD-10-CM | POA: Diagnosis not present

## 2023-01-05 DIAGNOSIS — H524 Presbyopia: Secondary | ICD-10-CM | POA: Diagnosis not present

## 2023-01-05 DIAGNOSIS — H52223 Regular astigmatism, bilateral: Secondary | ICD-10-CM | POA: Diagnosis not present

## 2023-01-05 DIAGNOSIS — M3500 Sicca syndrome, unspecified: Secondary | ICD-10-CM | POA: Diagnosis not present

## 2023-01-05 DIAGNOSIS — H353131 Nonexudative age-related macular degeneration, bilateral, early dry stage: Secondary | ICD-10-CM | POA: Diagnosis not present

## 2023-01-05 DIAGNOSIS — Z961 Presence of intraocular lens: Secondary | ICD-10-CM | POA: Diagnosis not present

## 2023-01-05 DIAGNOSIS — H04123 Dry eye syndrome of bilateral lacrimal glands: Secondary | ICD-10-CM | POA: Diagnosis not present

## 2023-01-14 ENCOUNTER — Other Ambulatory Visit: Payer: Self-pay | Admitting: Internal Medicine

## 2023-01-19 ENCOUNTER — Encounter: Payer: Self-pay | Admitting: Internal Medicine

## 2023-01-19 ENCOUNTER — Ambulatory Visit: Payer: PPO | Admitting: Internal Medicine

## 2023-01-19 ENCOUNTER — Ambulatory Visit (INDEPENDENT_AMBULATORY_CARE_PROVIDER_SITE_OTHER): Payer: PPO | Admitting: Internal Medicine

## 2023-01-19 VITALS — BP 140/68 | HR 86 | Temp 98.8°F | Ht 62.0 in | Wt 146.8 lb

## 2023-01-19 DIAGNOSIS — L930 Discoid lupus erythematosus: Secondary | ICD-10-CM

## 2023-01-19 DIAGNOSIS — I10 Essential (primary) hypertension: Secondary | ICD-10-CM | POA: Diagnosis not present

## 2023-01-19 DIAGNOSIS — E782 Mixed hyperlipidemia: Secondary | ICD-10-CM | POA: Diagnosis not present

## 2023-01-19 DIAGNOSIS — R609 Edema, unspecified: Secondary | ICD-10-CM

## 2023-01-19 DIAGNOSIS — E032 Hypothyroidism due to medicaments and other exogenous substances: Secondary | ICD-10-CM | POA: Diagnosis not present

## 2023-01-19 DIAGNOSIS — I5032 Chronic diastolic (congestive) heart failure: Secondary | ICD-10-CM | POA: Diagnosis not present

## 2023-01-19 DIAGNOSIS — D509 Iron deficiency anemia, unspecified: Secondary | ICD-10-CM

## 2023-01-19 MED ORDER — SERTRALINE HCL 25 MG PO TABS: 25.0000 mg | ORAL_TABLET | Freq: Every day | ORAL | 1 refills | Status: DC

## 2023-01-19 MED ORDER — OMEPRAZOLE 20 MG PO CPDR: 20.0000 mg | DELAYED_RELEASE_CAPSULE | Freq: Two times a day (BID) | ORAL | 1 refills | Status: DC

## 2023-01-19 MED ORDER — ATORVASTATIN CALCIUM 20 MG PO TABS: ORAL_TABLET | ORAL | 3 refills | Status: DC

## 2023-01-19 NOTE — Assessment & Plan Note (Addendum)
Managed with 25 mg  losartan .and 25 mg metoprolol   Has not been checking at home lately.

## 2023-01-19 NOTE — Assessment & Plan Note (Signed)
She has been excessively diuresed based on change in GFR from Oct to February using torsemide only.  She has not been using spironolactone and furosemide .  Repeat cr ordered ;  will change diuretic pending review of labs

## 2023-01-19 NOTE — Assessment & Plan Note (Signed)
Currently taking prednisone and MTX without disease in remission  considering starting biologicsbut wanting my opinion.  I have encouraged her to take Dr Dulce Sellar advice

## 2023-01-19 NOTE — Assessment & Plan Note (Addendum)
Thyroid function has not been checked in a year .  Taking 75  mcg daily levothyroxine

## 2023-01-19 NOTE — Progress Notes (Signed)
Subjective:  Patient ID: Kim Santiago, female    DOB: Feb 01, 1935  Age: 88 y.o. MRN: [id number]  CC: The primary encounter diagnosis was Essential hypertension. Diagnoses of Iatrogenic hypothyroidism, Mixed hyperlipidemia, Iron deficiency anemia, unspecified iron deficiency anemia type, Edema, unspecified type, Chronic diastolic heart failure (HCC), and Discoid lupus were also pertinent to this visit.   HPI [employer] presents for  Chief Complaint  Patient presents with   Medical Management of Chronic Issues    1) edema:  Dr Roberts Gaudy note reviewed:  "adding torsemide to aldactone 25mg  daily to lasix 20" during December post hospitalization follow up. And BNP > 300.   She has been taking torsemide daily , not taking spironolactone or lasix  EF normal 2022.    2) cutaneous SLE :  flaring  despite mtx increase to 20 mg and prednisone 5 mg .  Chest back and arms.    3) Anxiety:  needs refill on zoloft   Outpatient Medications Prior to Visit  Medication Sig Dispense Refill   Calcium Carbonate-Vitamin D 600-200 MG-UNIT TABS Take 2 tablets by mouth daily.     cholecalciferol (VITAMIN D) 1000 UNITS tablet Take 2,000 Units by mouth daily.     clobetasol ointment (TEMOVATE) 0.05 % Apply topically.     CRANBERRY PO Take 1 capsule by mouth 2 (two) times daily.     ferrous gluconate (FERGON) 324 MG tablet TAKE 1 TABLET BY MOUTH DAILY WITH BREAKFAST 90 tablet 1   folic acid (FOLVITE) 1 MG tablet Take 2 mg by mouth daily.     levothyroxine (SYNTHROID) 75 MCG tablet TAKE 1 TABLET BY MOUTH ( TOTAL) DAILY 90 tablet 2   losartan (COZAAR) 25 MG tablet TAKE 1 TABLET BY MOUTH EVERY DAY 90 tablet 1   Melatonin 10 MG TABS Take 1 tablet by mouth daily at 2 am.     methotrexate (RHEUMATREX) 2.5 MG tablet Take 15 mg by mouth once a week.     metolazone (ZAROXOLYN) 2.5 MG tablet Take 2.5 mg by mouth every other day.     metoprolol succinate (TOPROL-XL) 25 MG 24 hr tablet TAKE 1 TABLET BY MOUTH EVERY  DAY 90 tablet 3   nitroGLYCERIN (NITROSTAT) 0.4 MG SL tablet Place 1 tablet (0.4 mg total) under the tongue every 5 (five) minutes as needed for chest pain. Maximum 3 doses 50 tablet 3   polyethylene glycol (MIRALAX / GLYCOLAX) packet Take 17 g by mouth daily.     Probiotic Product (PROBIOTIC PO) Take 1 tablet by mouth daily.     sodium fluoride (PREVIDENT 5000 PLUS) 1.1 % CREA dental cream Take 1 application by mouth daily.      tizanidine (ZANAFLEX) 2 MG capsule Take 2 mg by mouth 3 (three) times daily as needed for muscle spasms.     torsemide (DEMADEX) 20 MG tablet Take 20 mg by mouth daily.     traZODone (DESYREL) 50 MG tablet Take 50 mg by mouth at bedtime.     triamcinolone cream (KENALOG) 0.1 % APPLY TO AFFECTED AREA TWICE A DAY     atorvastatin (LIPITOR) 20 MG tablet TAKE 1 TABLET BY MOUTH EVERY DAY AT 6 PM 90 tablet 3   omeprazole (PRILOSEC) 20 MG capsule TAKE 1 CAPSULE (20 MG TOTAL) BY MOUTH 2 (TWO) TIMES DAILY BEFORE A MEAL. 180 capsule 1   sertraline (ZOLOFT) 25 MG tablet TAKE 1 TABLET (25 MG TOTAL) BY MOUTH DAILY. 90 tablet 1   furosemide (LASIX) 20  MG tablet TAKE 1 TABLET BY MOUTH EVERY OTHER DAY AS NEEDED (Patient not taking: Reported on 01/19/2023) 45 tablet 2   spironolactone (ALDACTONE) 25 MG tablet Take 1 tablet by mouth every other day. (Patient not taking: Reported on 01/19/2023)     No facility-administered medications prior to visit.    Review of Systems;  Patient denies headache, fevers, malaise, unintentional weight loss, skin rash, eye pain, sinus congestion and sinus pain, sore throat, dysphagia,  hemoptysis , cough, dyspnea, wheezing, chest pain, palpitations, orthopnea, edema, abdominal pain, nausea, melena, diarrhea, constipation, flank pain, dysuria, hematuria, urinary  Frequency, nocturia, numbness, tingling, seizures,  Focal weakness, Loss of consciousness,  Tremor, insomnia, depression, anxiety, and suicidal ideation.      Objective:  BP (!) 140/68   Pulse  86   Temp 98.8 F (37.1 C) (Oral)   Ht 5\' 2"  (1.575 m)   Wt 146 lb 12.8 oz (66.6 kg)   SpO2 99%   BMI 26.85 kg/m   BP Readings from Last 3 Encounters:  01/19/23 (!) 140/68  07/28/22 130/62  07/15/22 132/62    Wt Readings from Last 3 Encounters:  01/19/23 146 lb 12.8 oz (66.6 kg)  07/28/22 144 lb 6.4 oz (65.5 kg)  07/15/22 146 lb 9.6 oz (66.5 kg)    Physical Exam Vitals reviewed.  Constitutional:      General: She is not in acute distress.    Appearance: Normal appearance. She is normal weight. She is not ill-appearing, toxic-appearing or diaphoretic.  HENT:     Head: Normocephalic.  Eyes:     General: No scleral icterus.       Right eye: No discharge.        Left eye: No discharge.     Conjunctiva/sclera: Conjunctivae normal.  Cardiovascular:     Rate and Rhythm: Normal rate and regular rhythm.     Heart sounds: Normal heart sounds.  Pulmonary:     Effort: Pulmonary effort is normal. No respiratory distress.     Breath sounds: Normal breath sounds.  Musculoskeletal:        General: Normal range of motion.  Skin:    General: Skin is warm and dry.  Neurological:     General: No focal deficit present.     Mental Status: She is alert and oriented to person, place, and time. Mental status is at baseline.  Psychiatric:        Mood and Affect: Mood normal.        Behavior: Behavior normal.        Thought Content: Thought content normal.        Judgment: Judgment normal.   Lab Results  Component Value Date   HGBA1C 4.9 11/08/2018    Lab Results  Component Value Date   CREATININE 0.79 07/15/2022   CREATININE 0.77 04/21/2022   CREATININE 0.62 01/06/2022    Lab Results  Component Value Date   WBC 5.0 07/15/2022   HGB 10.3 (L) 07/15/2022   HCT 29.6 (L) 07/15/2022   PLT 145.0 (L) 07/15/2022   GLUCOSE 105 (H) 07/15/2022   CHOL 95 07/15/2022   TRIG 66.0 07/15/2022   HDL 42.50 07/15/2022   LDLDIRECT 48.0 07/15/2022   LDLCALC 39 07/15/2022   ALT 7 07/15/2022    AST 16 07/15/2022   NA 136 07/15/2022   K 4.5 07/15/2022   CL 100 07/15/2022   CREATININE 0.79 07/15/2022   BUN 21 07/15/2022   CO2 32 07/15/2022   TSH 1.578 01/06/2022  INR 0.9 04/21/2022   HGBA1C 4.9 11/08/2018   MICROALBUR 1.0 07/15/2022    DG Chest Port 1 View  Result Date: 05/10/2021 CLINICAL DATA:  Abnormal chest x-ray. EXAM: PORTABLE CHEST 1 VIEW COMPARISON:  Radiograph 05/05/2021.  CT 05/08/2021 FINDINGS: Worsening lung aeration with progressive patchy opacities involving the right greater than left lung. Small pleural effusions have increased. Heart is normal in size, stable mediastinal contours. No pneumothorax. Again seen eventration of right hemidiaphragm. IMPRESSION: Worsening lung aeration with progressive patchy opacities involving the right greater than left lung. Findings may favor multifocal pneumonia over pulmonary edema. Small bilateral pleural effusions. Electronically Signed   By: Narda Rutherford M.D.   On: 05/10/2021 17:25   ECHOCARDIOGRAM COMPLETE  Result Date: 05/10/2021    ECHOCARDIOGRAM REPORT   Patient Name:   ELAYJAH VIGNEAULT Date of Exam: 05/10/2021 Medical Rec #:  [id number]        Height:       63.0 in Accession #:    [id number]       Weight:       143.3 lb Date of Birth:  [birth year]        BSA:          1.678 m Patient Age:    86 years         BP:           169/79 mmHg Patient Gender: F                HR:           99 bpm. Exam Location:  [facility] Procedure: 2D Echo and Strain Analysis Indications:     CHF Acute Systolic I50.21  History:         Patient has prior history of Echocardiogram examinations, most                  recent 11/08/2018.  Sonographer:     Overton Mam RDCS Referring Phys:  [id number] Vida Rigger Diagnosing Phys: Debbe Odea MD  Sonographer Comments: Global longitudinal strain was attempted. IMPRESSIONS  1. Left ventricular ejection fraction, by estimation, is 55 to 60%. The left ventricle has normal function. The left ventricle has no  regional wall motion abnormalities. Left ventricular diastolic parameters were normal.  2. Right ventricular systolic function is normal. The right ventricular size is normal.  3. The mitral valve is normal in structure. No evidence of mitral valve regurgitation.  4. The aortic valve is tricuspid. Aortic valve regurgitation is not visualized.  5. The inferior vena cava is normal in size with greater than 50% respiratory variability, suggesting right atrial pressure of 3 mmHg. FINDINGS  Left Ventricle: Left ventricular ejection fraction, by estimation, is 55 to 60%. The left ventricle has normal function. The left ventricle has no regional wall motion abnormalities. Global longitudinal strain performed but not reported based on interpreter judgement due to suboptimal tracking. The left ventricular internal cavity size was normal in size. There is no left ventricular hypertrophy. Left ventricular diastolic parameters were normal. Right Ventricle: The right ventricular size is normal. No increase in right ventricular wall thickness. Right ventricular systolic function is normal. Left Atrium: Left atrial size was normal in size. Right Atrium: Right atrial size was normal in size. Pericardium: There is no evidence of pericardial effusion. Mitral Valve: The mitral valve is normal in structure. No evidence of mitral valve regurgitation. Tricuspid Valve: The tricuspid valve is normal in structure. Tricuspid valve regurgitation is mild. Aortic Valve: The  aortic valve is tricuspid. Aortic valve regurgitation is not visualized. Aortic valve peak gradient measures 6.8 mmHg. Pulmonic Valve: The pulmonic valve was normal in structure. Pulmonic valve regurgitation is trivial. Aorta: The aortic root and ascending aorta are structurally normal, with no evidence of dilitation. Venous: The inferior vena cava is normal in size with greater than 50% respiratory variability, suggesting right atrial pressure of 3 mmHg. IAS/Shunts: No  atrial level shunt detected by color flow Doppler.  LEFT VENTRICLE PLAX 2D LVIDd:         3.50 cm  Diastology LVIDs:         2.50 cm  LV e' medial:    14.10 cm/s LV PW:         1.00 cm  LV E/e' medial:  7.6 LV IVS:        1.00 cm  LV e' lateral:   15.30 cm/s LVOT diam:     1.90 cm  LV E/e' lateral: 7.0 LV SV:         53 LV SV Index:   32 LVOT Area:     2.84 cm  RIGHT VENTRICLE RV Basal diam:  2.90 cm RV S prime:     15.10 cm/s TAPSE (M-mode): 2.1 cm LEFT ATRIUM             Index       RIGHT ATRIUM          Index LA diam:        3.00 cm 1.79 cm/m  RA Area:     9.68 cm LA Vol (A2C):   22.6 ml 13.47 ml/m RA Volume:   20.40 ml 12.16 ml/m LA Vol (A4C):   20.6 ml 12.28 ml/m LA Biplane Vol: 22.5 ml 13.41 ml/m  AORTIC VALVE                PULMONIC VALVE AV Area (Vmax): 2.11 cm    PV Vmax:       0.95 m/s AV Vmax:        130.00 cm/s PV Peak grad:  3.6 mmHg AV Peak Grad:   6.8 mmHg LVOT Vmax:      96.60 cm/s LVOT Vmean:     59.600 cm/s LVOT VTI:       0.187 m  AORTA Ao Root diam: 2.90 cm Ao Asc diam:  3.20 cm MITRAL VALVE                TRICUSPID VALVE MV Area (PHT): 5.16 cm     TV Peak grad:   27.1 mmHg MV Decel Time: 147 msec     TV Vmax:        2.61 m/s MV E velocity: 107.00 cm/s MV A velocity: 33.40 cm/s   SHUNTS MV E/A ratio:  3.20         Systemic VTI:  0.19 m                             Systemic Diam: 1.90 cm Debbe Odea MD Electronically signed by Debbe Odea MD Signature Date/Time: 05/10/2021/3:32:19 PM    Final     Assessment & Plan:  .Essential hypertension Assessment & Plan: Managed with 25 mg  losartan .and 25 mg metoprolol   Has not been checking at home lately.   Orders: -     Comprehensive metabolic panel -     Microalbumin / creatinine urine ratio  Iatrogenic hypothyroidism Assessment & Plan: Thyroid function  has not been checked in a year .  Taking 75  mcg daily levothyroxine   Orders: -     TSH -     CBC with Differential/Platelet  Mixed hyperlipidemia -     Lipid  panel -     LDL cholesterol, direct -     Hemoglobin A1c  Iron deficiency anemia, unspecified iron deficiency anemia type -     CBC with Differential/Platelet  Edema, unspecified type -     Brain natriuretic peptide  Chronic diastolic heart failure (HCC) Assessment & Plan: She has been excessively diuresed based on change in GFR from Oct to February using torsemide only.  She has not been using spironolactone and furosemide .  Repeat cr ordered ;  will change diuretic pending review of labs    Discoid lupus Assessment & Plan: Currently taking prednisone and MTX without disease in remission  considering starting biologicsbut wanting my opinion.  I have encouraged her to take Dr Dulce Sellar advice      Other orders -     Atorvastatin Calcium; TAKE 1 TABLET BY MOUTH EVERY DAY AT 6 PM  Dispense: 90 tablet; Refill: 3 -     Omeprazole; Take 1 capsule (20 mg total) by mouth 2 (two) times daily before a meal.  Dispense: 180 capsule; Refill: 1 -     Sertraline HCl; Take 1 tablet (25 mg total) by mouth daily.  Dispense: 90 tablet; Refill: 1     I provided 46 minutes of face-to-face time during this encounter reviewing patient's last visit with me, patient's  most recent visit with pulmonology,  dermatology, and hematology, ,  recent surgical and non surgical procedures, previous  labs and imaging studies, counseling on currently addressed issues,  and post visit ordering to diagnostics and therapeutics .   Follow-up: No follow-ups on file.   Sherlene Shams, MD

## 2023-01-19 NOTE — Patient Instructions (Addendum)
Zoloft is a good medication for anxiety,  you are on a very low dose ,  I recommend that you Continue zoloft,  but  increase dose to 50 mg daily as a trial for your anxiety   I will decide on which fluid pill you should continue after I see your labs   I recommend getting the infusion Dr Orma Flaming recommends

## 2023-01-20 LAB — BRAIN NATRIURETIC PEPTIDE: Brain Natriuretic Peptide: 15 pg/mL (ref <100)

## 2023-01-20 LAB — COMPREHENSIVE METABOLIC PANEL
ALT: 6 U/L (ref 0–35)
AST: 19 U/L (ref 0–37)
Albumin: 3.8 g/dL (ref 3.5–5.2)
Alkaline Phosphatase: 65 U/L (ref 39–117)
BUN: 28 mg/dL — ABNORMAL HIGH (ref 6–23)
CO2: 30 mEq/L (ref 19–32)
Calcium: 9 mg/dL (ref 8.4–10.5)
Chloride: 98 mEq/L (ref 96–112)
Creatinine, Ser: 0.91 mg/dL (ref 0.40–1.20)
GFR: 57.31 mL/min — ABNORMAL LOW (ref >60.00)
Glucose, Bld: 103 mg/dL — ABNORMAL HIGH (ref 70–99)
Potassium: 4.6 mEq/L (ref 3.5–5.1)
Sodium: 136 mEq/L (ref 135–145)
Total Bilirubin: 0.9 mg/dL (ref 0.2–1.2)
Total Protein: 6.1 g/dL (ref 6.0–8.3)

## 2023-01-20 LAB — CBC WITH DIFFERENTIAL/PLATELET
Basophils Absolute: 0 10*3/uL (ref 0.0–0.1)
Basophils Relative: 0.7 % (ref 0.0–3.0)
Eosinophils Absolute: 0.1 10*3/uL (ref 0.0–0.7)
Eosinophils Relative: 2.4 % (ref 0.0–5.0)
HCT: 28.3 % — ABNORMAL LOW (ref 36.0–46.0)
Hemoglobin: 10.2 g/dL — ABNORMAL LOW (ref 12.0–15.0)
Lymphocytes Relative: 24.6 % (ref 12.0–46.0)
Lymphs Abs: 0.7 10*3/uL (ref 0.7–4.0)
MCHC: 35.9 g/dL (ref 30.0–36.0)
MCV: 112.7 fl — ABNORMAL HIGH (ref 78.0–100.0)
Monocytes Absolute: 0.1 10*3/uL (ref 0.1–1.0)
Monocytes Relative: 2.6 % — ABNORMAL LOW (ref 3.0–12.0)
Neutro Abs: 1.9 10*3/uL (ref 1.4–7.7)
Neutrophils Relative %: 69.7 % (ref 43.0–77.0)
Platelets: 142 10*3/uL — ABNORMAL LOW (ref 150.0–400.0)
RBC: 2.51 Mil/uL — ABNORMAL LOW (ref 3.87–5.11)
RDW: 18.2 % — ABNORMAL HIGH (ref 11.5–15.5)
WBC: 2.8 10*3/uL — ABNORMAL LOW (ref 4.0–10.5)

## 2023-01-20 LAB — LIPID PANEL
Cholesterol: 85 mg/dL (ref 0–200)
HDL: 31.6 mg/dL — ABNORMAL LOW (ref >39.00)
LDL Cholesterol: 35 mg/dL (ref 0–99)
NonHDL: 53.52
Total CHOL/HDL Ratio: 3
Triglycerides: 95 mg/dL (ref 0.0–149.0)
VLDL: 19 mg/dL (ref 0.0–40.0)

## 2023-01-20 LAB — MICROALBUMIN / CREATININE URINE RATIO
Creatinine,U: 123.3 mg/dL
Microalb Creat Ratio: 0.9 mg/g (ref 0.0–30.0)
Microalb, Ur: 1.1 mg/dL (ref 0.0–1.9)

## 2023-01-20 LAB — HEMOGLOBIN A1C: Hgb A1c MFr Bld: 4.9 % (ref 4.6–6.5)

## 2023-01-20 LAB — LDL CHOLESTEROL, DIRECT: Direct LDL: 40 mg/dL

## 2023-01-20 LAB — TSH: TSH: 0.9 u[IU]/mL (ref 0.35–5.50)

## 2023-01-26 ENCOUNTER — Other Ambulatory Visit: Payer: PPO

## 2023-01-28 DIAGNOSIS — L931 Subacute cutaneous lupus erythematosus: Secondary | ICD-10-CM | POA: Diagnosis not present

## 2023-01-28 DIAGNOSIS — Z79899 Other long term (current) drug therapy: Secondary | ICD-10-CM | POA: Diagnosis not present

## 2023-02-05 ENCOUNTER — Telehealth: Payer: Self-pay | Admitting: Internal Medicine

## 2023-02-05 DIAGNOSIS — E782 Mixed hyperlipidemia: Secondary | ICD-10-CM

## 2023-02-05 MED ORDER — FERROUS GLUCONATE 324 (38 FE) MG PO TABS: 324.0000 mg | ORAL_TABLET | Freq: Every day | ORAL | 1 refills | Status: DC

## 2023-02-05 NOTE — Telephone Encounter (Signed)
Prescription Request  02/05/2023  LOV: 01/19/2023  What is the name of the medication or equipment? ferrous gluconate (FERGON) 324 MG tablet   Have you contacted your pharmacy to request a refill? No   Which pharmacy would you like this sent to?  [id number] [place], [place] - [address] [place] [address] Phone: [phone] Fax: [phone]    Patient notified that their request is being sent to the clinical staff for review and that they should receive a response within 2 business days.   Please advise at Mobile [phone] (mobile)

## 2023-02-05 NOTE — Telephone Encounter (Signed)
I have pended CMP for your approval.

## 2023-02-05 NOTE — Telephone Encounter (Signed)
Repeat liver enzyme order in a month needing order in the system . Patient has been scheduled.

## 2023-02-17 ENCOUNTER — Other Ambulatory Visit: Payer: PPO

## 2023-02-23 DIAGNOSIS — Z66 Do not resuscitate: Secondary | ICD-10-CM | POA: Diagnosis not present

## 2023-02-23 DIAGNOSIS — I10 Essential (primary) hypertension: Secondary | ICD-10-CM | POA: Diagnosis not present

## 2023-02-23 DIAGNOSIS — Z515 Encounter for palliative care: Secondary | ICD-10-CM | POA: Diagnosis not present

## 2023-02-23 DIAGNOSIS — F330 Major depressive disorder, recurrent, mild: Secondary | ICD-10-CM | POA: Diagnosis not present

## 2023-02-23 DIAGNOSIS — I739 Peripheral vascular disease, unspecified: Secondary | ICD-10-CM | POA: Diagnosis not present

## 2023-02-23 DIAGNOSIS — D692 Other nonthrombocytopenic purpura: Secondary | ICD-10-CM | POA: Diagnosis not present

## 2023-02-23 DIAGNOSIS — C50912 Malignant neoplasm of unspecified site of left female breast: Secondary | ICD-10-CM | POA: Diagnosis not present

## 2023-02-23 DIAGNOSIS — F0393 Unspecified dementia, unspecified severity, with mood disturbance: Secondary | ICD-10-CM | POA: Diagnosis not present

## 2023-02-23 DIAGNOSIS — I69354 Hemiplegia and hemiparesis following cerebral infarction affecting left non-dominant side: Secondary | ICD-10-CM | POA: Diagnosis not present

## 2023-04-16 ENCOUNTER — Ambulatory Visit (INDEPENDENT_AMBULATORY_CARE_PROVIDER_SITE_OTHER): Payer: PPO | Admitting: *Deleted

## 2023-04-16 VITALS — Ht 62.0 in | Wt 138.0 lb

## 2023-04-16 DIAGNOSIS — Z0000 Encounter for general adult medical examination without abnormal findings: Secondary | ICD-10-CM

## 2023-04-16 NOTE — Progress Notes (Signed)
Subjective:   Kim Santiago is a 88 y.o. female who presents for Medicare Annual (Subsequent) preventive examination.  Visit Complete: Virtual  I connected with  Billy R Kia on 04/16/23 by a audio enabled telemedicine application and verified that I am speaking with the correct person using two identifiers.  Patient Location: Home  Provider Location: Home Office  I discussed the limitations of evaluation and management by telemedicine. The patient expressed understanding and agreed to proceed.  Vital Signs: Unable to obtain new vitals due to this being a telehealth visit.   Review of Systems     Cardiac Risk Factors include: advanced age (>81men, >5 women);dyslipidemia;hypertension     Objective:    Today's Vitals   04/16/23 0958  Weight: 138 lb (62.6 kg)  Height: 5\' 2"  (1.575 m)   Body mass index is 25.24 kg/m.     04/16/2023   10:22 AM 04/13/2022   10:42 AM 01/23/2022    1:19 PM 01/06/2022    1:32 PM 01/06/2022   12:35 PM 06/30/2021    2:57 PM 05/06/2021    6:00 PM  Advanced Directives  Does Patient Have a Medical Advance Directive? Yes Yes Yes Yes Yes Yes Yes  Type of Estate agent of [place];Living will Healthcare Power of [place];Living will Living will;Healthcare Power of [employer] Power of [place];Living will Healthcare Power of [place];Living will Healthcare Power of [place];Living will;Out of facility DNR (pink MOST or yellow form) Out of facility DNR (pink MOST or yellow form);Healthcare Power of Attorney  Does patient want to make changes to medical advance directive? No - Patient declined No - Patient declined   No - Patient declined No - Patient declined No - Patient declined  Copy of Healthcare Power of Attorney in Chart? Yes - validated most recent copy scanned in chart (See row information) Yes - validated most recent copy scanned in chart (See row information)  Yes - validated most recent copy scanned in chart (See row  information)  Yes - validated most recent copy scanned in chart (See row information) No - copy requested  Would patient like information on creating a medical advance directive?   No - Patient declined No - Patient declined     Pre-existing out of facility DNR order (yellow form or pink MOST form)      Yellow form placed in chart (order not valid for inpatient use) Physician notified to receive inpatient order    Current Medications (verified) Outpatient Encounter Medications as of 04/16/2023  Medication Sig   atorvastatin (LIPITOR) 20 MG tablet TAKE 1 TABLET BY MOUTH EVERY DAY AT 6 PM   Calcium Carbonate-Vitamin D 600-200 MG-UNIT TABS Take 2 tablets by mouth daily.   cholecalciferol (VITAMIN D) 1000 UNITS tablet Take 2,000 Units by mouth daily.   CRANBERRY PO Take 1 capsule by mouth 2 (two) times daily.   folic acid (FOLVITE) 1 MG tablet Take 2 mg by mouth daily.   furosemide (LASIX) 20 MG tablet TAKE 1 TABLET BY MOUTH EVERY OTHER DAY AS NEEDED   levothyroxine (SYNTHROID) 75 MCG tablet TAKE 1 TABLET BY MOUTH ( TOTAL) DAILY   losartan (COZAAR) 25 MG tablet TAKE 1 TABLET BY MOUTH EVERY DAY   methotrexate (RHEUMATREX) 2.5 MG tablet Take 15 mg by mouth once a week.   metoprolol succinate (TOPROL-XL) 25 MG 24 hr tablet TAKE 1 TABLET BY MOUTH EVERY DAY   omeprazole (PRILOSEC) 20 MG capsule Take 1 capsule (20 mg total) by mouth  2 (two) times daily before a meal.   polyethylene glycol (MIRALAX / GLYCOLAX) packet Take 17 g by mouth daily.   predniSONE (DELTASONE) 5 MG tablet Take 5 mg by mouth daily with breakfast.   Probiotic Product (PROBIOTIC PO) Take 1 tablet by mouth daily.   sertraline (ZOLOFT) 25 MG tablet Take 1 tablet (25 mg total) by mouth daily.   sodium fluoride (PREVIDENT 5000 PLUS) 1.1 % CREA dental cream Take 1 application by mouth daily.    tizanidine (ZANAFLEX) 2 MG capsule Take 2 mg by mouth 3 (three) times daily as needed for muscle spasms.   torsemide (DEMADEX) 20 MG tablet  Take 20 mg by mouth daily.   triamcinolone cream (KENALOG) 0.1 % APPLY TO AFFECTED AREA TWICE A DAY   clobetasol ointment (TEMOVATE) 0.05 % Apply topically. (Patient not taking: Reported on 04/16/2023)   ferrous gluconate (FERGON) 324 MG tablet Take 1 tablet (324 mg total) by mouth daily with breakfast. (Patient not taking: Reported on 04/16/2023)   Melatonin 10 MG TABS Take 1 tablet by mouth daily at 2 am. (Patient not taking: Reported on 04/16/2023)   metolazone (ZAROXOLYN) 2.5 MG tablet Take 2.5 mg by mouth every other day. (Patient not taking: Reported on 04/16/2023)   nitroGLYCERIN (NITROSTAT) 0.4 MG SL tablet Place 1 tablet (0.4 mg total) under the tongue every 5 (five) minutes as needed for chest pain. Maximum 3 doses (Patient not taking: Reported on 04/16/2023)   spironolactone (ALDACTONE) 25 MG tablet Take 1 tablet by mouth every other day. (Patient not taking: Reported on 01/19/2023)   traZODone (DESYREL) 50 MG tablet Take 50 mg by mouth at bedtime. (Patient not taking: Reported on 04/16/2023)   No facility-administered encounter medications on file as of 04/16/2023.    Allergies (verified) Azathioprine, Hydroxychloroquine, Mycophenolate mofetil, Amoxicillin, Codeine, Naprosyn [naproxen], Orudis [ketoprofen], Sulfa antibiotics, and Sulfathiazole   History: Past Medical History:  Diagnosis Date   Arthritis    Breast cancer (HCC) 2017   left mastectomy done 11/2015   Breast cancer in female [facility]) 11/18/2015   Left: 3.9 cm tumor, T2, 1/2 sentinel nodes positive for macro metastatic disease, N1, 3 negative nodes in the axillary tail, ER+,PR+, Her 2 neu, low Mammoprint score   Cancer (HCC)    thyroid takes levothyroxine   CAP (community acquired pneumonia) 05/05/2021   Chronic kidney disease    UTI   Genetic screening 11/2015   Mammoprint of left breast cancer: Low risk for recurrence.    History of heart attack 06/12/2014   Hypertension    hypothyroidism    secondary to thyroidectomy for  thyroid ca   Hypothyroidism    Lupus (HCC)    subcutaneous   Menopause 40s   natural, hot flashes and mood lability now gone, off prempro 7 months   Myocardial infarction [facility]) 2013   Osteoporosis    Osteopenia   Rosacea    Sjoegren syndrome    Stress-induced cardiomyopathy September of 2013   EF 35%. Peak troponin was 1.8.   Stroke [facility]) 10/2018   Past Surgical History:  Procedure Laterality Date   BACK SURGERY     BREAST BIOPSY Left 10/30/15   positive, done in Dr. Rutherford Nail office   CARDIAC CATHETERIZATION  05/2012   [facility]. No significant CAD. Ejection fraction of 35% due to stress-induced cardiomyopathy.   CHOLECYSTECTOMY     COLONOSCOPY     DILATION AND CURETTAGE OF UTERUS     KYPHOSIS SURGERY  Feb 2008   L1,  Dr. Gerrit Heck   LUMBAR DISC SURGERY     L4-L5   MASTECTOMY Left 11/18/2015   positive   SENTINEL NODE BIOPSY Left 11/18/2015   Procedure: SENTINEL NODE BIOPSY;  Surgeon: Earline Mayotte, MD;  Location: [facility] ORS;  Service: General;  Laterality: Left;   SHOULDER ARTHROSCOPY  2004   Left, Dr. Gavin Potters   SIMPLE MASTECTOMY WITH AXILLARY SENTINEL NODE BIOPSY Left 11/18/2015   Procedure: SIMPLE MASTECTOMY;  Surgeon: Earline Mayotte, MD;  Location: [facility] ORS;  Service: General;  Laterality: Left;   SPINE SURGERY     L4-5 diskectomy   THYROIDECTOMY     Thyroid Cancer   TONSILLECTOMY     TUBAL LIGATION     Family History  Problem Relation Age of Onset   Kidney disease Mother    Heart disease Mother    COPD Father    Cancer Father        esophageal   Kidney disease Sister    Cancer Brother 27       colon cancer (both brothers)   Cancer Brother    Heart attack Brother 53   Heart disease Brother    Breast cancer Neg Hx    Social History   Socioeconomic History   Marital status: Widowed    Spouse name: Not on file   Number of children: 1   Years of education: college   Highest education level: Not on file  Occupational History   Not on file  Tobacco Use    Smoking status: Never   Smokeless tobacco: Never  Vaping Use   Vaping status: Never Used  Substance and Sexual Activity   Alcohol use: No   Drug use: No   Sexual activity: Not Currently  Other Topics Concern   Not on file  Social History Narrative   Has 1 adopted daughter.   Social Determinants of Health   Financial Resource Strain: Low Risk  (04/16/2023)   Overall Financial Resource Strain (CARDIA)    Difficulty of Paying Living Expenses: Not hard at all  Food Insecurity: No Food Insecurity (04/16/2023)   Hunger Vital Sign    Worried About Running Out of Food in the Last Year: Never true    Ran Out of Food in the Last Year: Never true  Transportation Needs: No Transportation Needs (04/16/2023)   PRAPARE - Administrator, Civil Service (Medical): No    Lack of Transportation (Non-Medical): No  Physical Activity: Inactive (04/16/2023)   Exercise Vital Sign    Days of Exercise per Week: 0 days    Minutes of Exercise per Session: 0 min  Stress: No Stress Concern Present (04/16/2023)   [employer] of Occupational Health - Occupational Stress Questionnaire    Feeling of Stress : Only a little  Social Connections: Moderately Isolated (04/16/2023)   Social Connection and Isolation Panel [NHANES]    Frequency of Communication with Friends and Family: More than three times a week    Frequency of Social Gatherings with Friends and Family: More than three times a week    Attends Religious Services: More than 4 times per year    Active Member of [employer] or Organizations: No    Attends Banker Meetings: Never    Marital Status: Widowed    Tobacco Counseling Counseling given: Not Answered   Clinical Intake:  Pre-visit preparation completed: Yes  Pain : No/denies pain     BMI - recorded: 25.24 Nutritional Status: BMI 25 -29 Overweight Nutritional  Risks: None Diabetes: No  How often do you need to have someone help you when you read instructions, pamphlets,  or other written materials from your doctor or pharmacy?: 1 - Never  Interpreter Needed?: No  Information entered by :: R.  LPN   Activities of Daily Living    04/16/2023   10:01 AM  In your present state of health, do you have any difficulty performing the following activities:  Hearing? 1  Comment wears aids  Vision? 0  Comment readers  Difficulty concentrating or making decisions? 1  Comment a little with memory  Walking or climbing stairs? 1  Comment having PT  Dressing or bathing? 0  Doing errands, shopping? 1  Comment daughter helps  Preparing Food and eating ? N  Using the Toilet? N  In the past six months, have you accidently leaked urine? Y  Comment wears pads at times  Do you have problems with loss of bowel control? N  Managing your Medications? N  Managing your Finances? N  Housekeeping or managing your Housekeeping? N    Patient Care Team: Sherlene Shams, MD as PCP - General (Internal Medicine) Sherlene Shams, MD (Internal Medicine) Lemar Livings Merrily Pew, MD (General Surgery) Debbrah Alar, MD (Dermatology)  Indicate any recent Medical Services you may have received from other than [facility] providers in the past year (date may be approximate).     Assessment:   This is a routine wellness examination for Jacquelyne.  Hearing/Vision screen Hearing Screening - Comments:: Wears aids Vision Screening - Comments:: readers  Dietary issues and exercise activities discussed:     Goals Addressed             This Visit's Progress    Patient Stated       Wants to improve her walking and get more balance       Depression Screen    04/16/2023   10:16 AM 01/19/2023    3:44 PM 07/15/2022    3:06 PM 04/13/2022   10:42 AM 10/21/2021    3:08 PM 08/27/2021    4:21 PM 06/30/2021    3:11 PM  PHQ 2/9 Scores  PHQ - 2 Score 0 0 2 0 1 0 1  PHQ- 9 Score 4  8  1  0     Fall Risk    04/16/2023   10:05 AM 01/19/2023    3:44 PM 07/15/2022    2:12 PM 04/21/2022     1:59 PM 04/13/2022   10:58 AM  Fall Risk   Falls in the past year? 0 0 0 1 0  Number falls in past yr: 0 0  0   Injury with Fall? 0 0  0   Risk for fall due to : No Fall Risks No Fall Risks No Fall Risks History of fall(s)   Follow up Falls prevention discussed;Falls evaluation completed;Education provided Falls evaluation completed Falls evaluation completed Falls evaluation completed Falls evaluation completed    MEDICARE RISK AT HOME:  Medicare Risk at Home - 04/16/23 1006     Any stairs in or around the home? Yes    If so, are there any without handrails? No    Home free of loose throw rugs in walkways, pet beds, electrical cords, etc? Yes    Adequate lighting in your home to reduce risk of falls? Yes    Life alert? No    Use of a cane, walker or w/c? No    Grab bars in the  bathroom? Yes    Shower chair or bench in shower? Yes    Elevated toilet seat or a handicapped toilet? Yes             Cognitive Function:    03/31/2018    1:25 PM 03/30/2017    1:37 PM 08/12/2015    3:06 PM  MMSE - Mini Mental State Exam  Orientation to time 5 5 5   Orientation to Place 5 5 5   Registration 3 3 3   Attention/ Calculation 5 5 5   Recall 3 3 3   Language- name 2 objects 2 2 2   Language- repeat 1 1 1   Language- follow 3 step command 3 3 3   Language- read & follow direction 1 1 1   Write a sentence 1 1 1   Copy design 1 1 1   Total score 30 30 30         04/16/2023   10:23 AM 04/03/2020    9:51 AM 04/03/2019    9:57 AM  6CIT Screen  What Year? 0 points 0 points 0 points  What month? 0 points 0 points 0 points  What time? 0 points 0 points 0 points  Count back from 20 0 points 0 points 0 points  Months in reverse 0 points 0 points 0 points  Repeat phrase 0 points 0 points 0 points  Total Score 0 points 0 points 0 points    Immunizations Immunization History  Administered Date(s) Administered   Fluad Quad(high Dose 65+) 06/18/2021, 07/15/2022   Influenza Split 07/08/2012    Influenza, High Dose Seasonal PF 06/08/2016, 06/11/2017, 06/06/2019   Influenza,inj,Quad PF,6+ Mos 06/08/2013, 05/04/2014, 05/06/2015   Influenza-Unspecified 05/04/2018, 06/04/2020   Moderna Sars-Covid-2 Vaccination 08/13/2020   PFIZER(Purple Top)SARS-COV-2 Vaccination 09/27/2019, 10/21/2019   Pneumococcal Conjugate-13 03/06/2014   Pneumococcal Polysaccharide-23 10/04/2018   Pneumococcal-Unspecified 10/11/2007   Tdap 01/05/2013   Zoster, Live 01/12/2012    TDAP status: Due, Education has been provided regarding the importance of this vaccine. Advised may receive this vaccine at local pharmacy or Health Dept. Aware to provide a copy of the vaccination record if obtained from local pharmacy or Health Dept. Verbalized acceptance and understanding.  Flu Vaccine status: Up to date  Pneumococcal vaccine status: Up to date  Covid-19 vaccine status: Completed vaccines  Qualifies for Shingles Vaccine? Yes   Zostavax completed Yes   Shingrix Completed?: No.    Education has been provided regarding the importance of this vaccine. Patient has been advised to call insurance company to determine out of pocket expense if they have not yet received this vaccine. Advised may also receive vaccine at local pharmacy or Health Dept. Verbalized acceptance and understanding.  Screening Tests Health Maintenance  Topic Date Due   Zoster Vaccines- Shingrix (1 of 2) 01/04/1954   COVID-19 Vaccine (4 - 2023-24 season) 05/08/2022   DTaP/Tdap/Td (2 - Td or Tdap) 01/06/2023   INFLUENZA VACCINE  04/08/2023   Medicare Annual Wellness (AWV)  04/15/2024   Pneumonia Vaccine 40+ Years old  Completed   DEXA SCAN  Completed   HPV VACCINES  Aged Out    Health Maintenance  Health Maintenance Due  Topic Date Due   Zoster Vaccines- Shingrix (1 of 2) 01/04/1954   COVID-19 Vaccine (4 - 2023-24 season) 05/08/2022   DTaP/Tdap/Td (2 - Td or Tdap) 01/06/2023   INFLUENZA VACCINE  04/08/2023    Colorectal cancer  screening: No longer required.   Mammogram status: No longer required due to age.  Bone Density status: Completed 8/21.  Results reflect: Bone density results: OSTEOPENIA. Repeat every 2 years. Wants to discuss with PCP  Lung Cancer Screening: (Low Dose CT Chest recommended if Age 16-80 years, 20 pack-year currently smoking OR have quit w/in 15years.) does not qualify.   Additional Screening:  Hepatitis C Screening: does not qualify; Completed NA Age  Vision Screening: Recommended annual ophthalmology exams for early detection of glaucoma and other disorders of the eye. Is the patient up to date with their annual eye exam?  Yes  Who is the provider or what is the name of the office in which the patient attends annual eye exams? Dr. Larence Penning If pt is not established with a provider, would they like to be referred to a provider to establish care? No .   Dental Screening: Recommended annual dental exams for proper oral hygiene   Community Resource Referral / Chronic Care Management: CRR required this visit?  No   CCM required this visit?  No     Plan:     I have personally reviewed and noted the following in the patient's chart:   Medical and social history Use of alcohol, tobacco or illicit drugs  Current medications and supplements including opioid prescriptions. Patient is not currently taking opioid prescriptions. Functional ability and status Nutritional status Physical activity Advanced directives List of other physicians Hospitalizations, surgeries, and ER visits in previous 12 months Vitals Screenings to include cognitive, depression, and falls Referrals and appointments  In addition, I have reviewed and discussed with patient certain preventive protocols, quality metrics, and best practice recommendations. A written personalized care plan for preventive services as well as general preventive health recommendations were provided to patient.     Sydell Axon,  LPN   12/09/345   After Visit Summary: (MyChart) Due to this being a telephonic visit, the after visit summary with patients personalized plan was offered to patient via MyChart   Nurse Notes: None

## 2023-04-16 NOTE — Patient Instructions (Signed)
Kim Santiago , Thank you for taking time to come for your Medicare Wellness Visit. I appreciate your ongoing commitment to your health goals. Please review the following plan we discussed and let me know if I can assist you in the future.   Referrals/Orders/Follow-Ups/Clinician Recommendations: None  This is a list of the screening recommended for you and due dates:  Health Maintenance  Topic Date Due   Zoster (Shingles) Vaccine (1 of 2) 01/04/1954   COVID-19 Vaccine (4 - 2023-24 season) 05/08/2022   DTaP/Tdap/Td vaccine (2 - Td or Tdap) 01/06/2023   Flu Shot  04/08/2023   Medicare Annual Wellness Visit  04/15/2024   Pneumonia Vaccine  Completed   DEXA scan (bone density measurement)  Completed   HPV Vaccine  Aged Out    Advanced directives: (In Chart) A copy of your advanced directives are scanned into your chart should your provider ever need it.  Next Medicare Annual Wellness Visit scheduled for next year: Yes 04/18/24 @ 11:00   Preventive Care 65 Years and Older, Female Preventive care refers to lifestyle choices and visits with your health care provider that can promote health and wellness. What does preventive care include? A yearly physical exam. This is also called an annual well check. Dental exams once or twice a year. Routine eye exams. Ask your health care provider how often you should have your eyes checked. Personal lifestyle choices, including: Daily care of your teeth and gums. Regular physical activity. Eating a healthy diet. Avoiding tobacco and drug use. Limiting alcohol use. Practicing safe sex. Taking low-dose aspirin every day. Taking vitamin and mineral supplements as recommended by your health care provider. What happens during an annual well check? The services and screenings done by your health care provider during your annual well check will depend on your age, overall health, lifestyle risk factors, and family history of disease. Counseling  Your  health care provider may ask you questions about your: Alcohol use. Tobacco use. Drug use. Emotional well-being. Home and relationship well-being. Sexual activity. Eating habits. History of falls. Memory and ability to understand (cognition). Work and work Astronomer. Reproductive health. Screening  You may have the following tests or measurements: Height, weight, and BMI. Blood pressure. Lipid and cholesterol levels. These may be checked every 5 years, or more frequently if you are over 73 years old. Skin check. Lung cancer screening. You may have this screening every year starting at age 76 if you have a 30-pack-year history of smoking and currently smoke or have quit within the past 15 years. Fecal occult blood test (FOBT) of the stool. You may have this test every year starting at age 65. Flexible sigmoidoscopy or colonoscopy. You may have a sigmoidoscopy every 5 years or a colonoscopy every 10 years starting at age 92. Hepatitis C blood test. Hepatitis B blood test. Sexually transmitted disease (STD) testing. Diabetes screening. This is done by checking your blood sugar (glucose) after you have not eaten for a while (fasting). You may have this done every 1-3 years. Bone density scan. This is done to screen for osteoporosis. You may have this done starting at age 28. Mammogram. This may be done every 1-2 years. Talk to your health care provider about how often you should have regular mammograms. Talk with your health care provider about your test results, treatment options, and if necessary, the need for more tests. Vaccines  Your health care provider may recommend certain vaccines, such as: Influenza vaccine. This is recommended every year.  Tetanus, diphtheria, and acellular pertussis (Tdap, Td) vaccine. You may need a Td booster every 10 years. Zoster vaccine. You may need this after age 11. Pneumococcal 13-valent conjugate (PCV13) vaccine. One dose is recommended after age  83. Pneumococcal polysaccharide (PPSV23) vaccine. One dose is recommended after age 61. Talk to your health care provider about which screenings and vaccines you need and how often you need them. This information is not intended to replace advice given to you by your health care provider. Make sure you discuss any questions you have with your health care provider. Document Released: 09/20/2015 Document Revised: 05/13/2016 Document Reviewed: 06/25/2015 Elsevier Interactive Patient Education  2017 [employer].  Fall Prevention in the Home Falls can cause injuries. They can happen to people of all ages. There are many things you can do to make your home safe and to help prevent falls. What can I do on the outside of my home? Regularly fix the edges of walkways and driveways and fix any cracks. Remove anything that might make you trip as you walk through a door, such as a raised step or threshold. Trim any bushes or trees on the path to your home. Use bright outdoor lighting. Clear any walking paths of anything that might make someone trip, such as rocks or tools. Regularly check to see if handrails are loose or broken. Make sure that both sides of any steps have handrails. Any raised decks and porches should have guardrails on the edges. Have any leaves, snow, or ice cleared regularly. Use sand or salt on walking paths during winter. Clean up any spills in your garage right away. This includes oil or grease spills. What can I do in the bathroom? Use night lights. Install grab bars by the toilet and in the tub and shower. Do not use towel bars as grab bars. Use non-skid mats or decals in the tub or shower. If you need to sit down in the shower, use a plastic, non-slip stool. Keep the floor dry. Clean up any water that spills on the floor as soon as it happens. Remove soap buildup in the tub or shower regularly. Attach bath mats securely with double-sided non-slip rug tape. Do not have throw  rugs and other things on the floor that can make you trip. What can I do in the bedroom? Use night lights. Make sure that you have a light by your bed that is easy to reach. Do not use any sheets or blankets that are too big for your bed. They should not hang down onto the floor. Have a firm chair that has side arms. You can use this for support while you get dressed. Do not have throw rugs and other things on the floor that can make you trip. What can I do in the kitchen? Clean up any spills right away. Avoid walking on wet floors. Keep items that you use a lot in easy-to-reach places. If you need to reach something above you, use a strong step stool that has a grab bar. Keep electrical cords out of the way. Do not use floor polish or wax that makes floors slippery. If you must use wax, use non-skid floor wax. Do not have throw rugs and other things on the floor that can make you trip. What can I do with my stairs? Do not leave any items on the stairs. Make sure that there are handrails on both sides of the stairs and use them. Fix handrails that are broken or loose.  Make sure that handrails are as long as the stairways. Check any carpeting to make sure that it is firmly attached to the stairs. Fix any carpet that is loose or worn. Avoid having throw rugs at the top or bottom of the stairs. If you do have throw rugs, attach them to the floor with carpet tape. Make sure that you have a light switch at the top of the stairs and the bottom of the stairs. If you do not have them, ask someone to add them for you. What else can I do to help prevent falls? Wear shoes that: Do not have high heels. Have rubber bottoms. Are comfortable and fit you well. Are closed at the toe. Do not wear sandals. If you use a stepladder: Make sure that it is fully opened. Do not climb a closed stepladder. Make sure that both sides of the stepladder are locked into place. Ask someone to hold it for you, if  possible. Clearly mark and make sure that you can see: Any grab bars or handrails. First and last steps. Where the edge of each step is. Use tools that help you move around (mobility aids) if they are needed. These include: Canes. Walkers. Scooters. Crutches. Turn on the lights when you go into a dark area. Replace any light bulbs as soon as they burn out. Set up your furniture so you have a clear path. Avoid moving your furniture around. If any of your floors are uneven, fix them. If there are any pets around you, be aware of where they are. Review your medicines with your doctor. Some medicines can make you feel dizzy. This can increase your chance of falling. Ask your doctor what other things that you can do to help prevent falls. This information is not intended to replace advice given to you by your health care provider. Make sure you discuss any questions you have with your health care provider. Document Released: 06/20/2009 Document Revised: 01/30/2016 Document Reviewed: 09/28/2014 Elsevier Interactive Patient Education  2017 [employer].

## 2023-04-22 DIAGNOSIS — Z79899 Other long term (current) drug therapy: Secondary | ICD-10-CM | POA: Diagnosis not present

## 2023-04-22 DIAGNOSIS — L931 Subacute cutaneous lupus erythematosus: Secondary | ICD-10-CM | POA: Diagnosis not present

## 2023-04-23 DIAGNOSIS — H524 Presbyopia: Secondary | ICD-10-CM | POA: Diagnosis not present

## 2023-04-23 DIAGNOSIS — H04123 Dry eye syndrome of bilateral lacrimal glands: Secondary | ICD-10-CM | POA: Diagnosis not present

## 2023-04-23 DIAGNOSIS — Z961 Presence of intraocular lens: Secondary | ICD-10-CM | POA: Diagnosis not present

## 2023-04-23 DIAGNOSIS — M3500 Sicca syndrome, unspecified: Secondary | ICD-10-CM | POA: Diagnosis not present

## 2023-04-23 DIAGNOSIS — H353131 Nonexudative age-related macular degeneration, bilateral, early dry stage: Secondary | ICD-10-CM | POA: Diagnosis not present

## 2023-04-23 DIAGNOSIS — H5203 Hypermetropia, bilateral: Secondary | ICD-10-CM | POA: Diagnosis not present

## 2023-04-23 DIAGNOSIS — H52223 Regular astigmatism, bilateral: Secondary | ICD-10-CM | POA: Diagnosis not present

## 2023-05-05 ENCOUNTER — Ambulatory Visit: Payer: PPO | Admitting: Family Medicine

## 2023-06-02 ENCOUNTER — Ambulatory Visit: Payer: PPO | Admitting: Internal Medicine

## 2023-06-02 VITALS — BP 120/68 | HR 86 | Temp 97.9°F | Resp 16 | Ht 63.0 in | Wt 142.6 lb

## 2023-06-02 DIAGNOSIS — D61818 Other pancytopenia: Secondary | ICD-10-CM

## 2023-06-02 DIAGNOSIS — H52223 Regular astigmatism, bilateral: Secondary | ICD-10-CM | POA: Diagnosis not present

## 2023-06-02 DIAGNOSIS — I872 Venous insufficiency (chronic) (peripheral): Secondary | ICD-10-CM

## 2023-06-02 DIAGNOSIS — E8809 Other disorders of plasma-protein metabolism, not elsewhere classified: Secondary | ICD-10-CM | POA: Diagnosis not present

## 2023-06-02 DIAGNOSIS — D638 Anemia in other chronic diseases classified elsewhere: Secondary | ICD-10-CM

## 2023-06-02 DIAGNOSIS — D509 Iron deficiency anemia, unspecified: Secondary | ICD-10-CM

## 2023-06-02 DIAGNOSIS — Z23 Encounter for immunization: Secondary | ICD-10-CM

## 2023-06-02 DIAGNOSIS — E876 Hypokalemia: Secondary | ICD-10-CM

## 2023-06-02 DIAGNOSIS — H04123 Dry eye syndrome of bilateral lacrimal glands: Secondary | ICD-10-CM | POA: Diagnosis not present

## 2023-06-02 DIAGNOSIS — H524 Presbyopia: Secondary | ICD-10-CM | POA: Diagnosis not present

## 2023-06-02 DIAGNOSIS — Z961 Presence of intraocular lens: Secondary | ICD-10-CM | POA: Diagnosis not present

## 2023-06-02 DIAGNOSIS — H5203 Hypermetropia, bilateral: Secondary | ICD-10-CM | POA: Diagnosis not present

## 2023-06-02 DIAGNOSIS — H353131 Nonexudative age-related macular degeneration, bilateral, early dry stage: Secondary | ICD-10-CM | POA: Diagnosis not present

## 2023-06-02 DIAGNOSIS — L931 Subacute cutaneous lupus erythematosus: Secondary | ICD-10-CM | POA: Diagnosis not present

## 2023-06-02 DIAGNOSIS — M3500 Sicca syndrome, unspecified: Secondary | ICD-10-CM | POA: Diagnosis not present

## 2023-06-02 MED ORDER — MELOXICAM 15 MG PO TABS: 15.0000 mg | ORAL_TABLET | Freq: Every day | ORAL | 0 refills | Status: DC

## 2023-06-02 MED ORDER — TIZANIDINE HCL 4 MG PO TABS: 4.0000 mg | ORAL_TABLET | Freq: Four times a day (QID) | ORAL | 0 refills | Status: DC | PRN

## 2023-06-02 NOTE — Patient Instructions (Addendum)
Continue the furosemide for now.   If your potassium is low I will call in a supplement to take  I will be  happy to draw the labs for Dr Smith Robert or Dr Orma Flaming   The muscles in your upper back are in spasm:  Take 1000 mg tylenol 1 to 2 times  daily   Add once daily meloxicam  Apply heating pad or ice for 15 minutes every few hours  Tizanidine is your muscle relaxer .   it .may make you sleepy   The Tdap (tetanus-diphtheria-whooping cough vaccine ) is due and   COVERED BY MEDICARE if you get them at your pharmacy as of  January 1.

## 2023-06-02 NOTE — Progress Notes (Unsigned)
HCT 28.3 (L) 01/19/2023   PLT 142.0 (L) 01/19/2023   GLUCOSE 103 (H) 01/19/2023   CHOL 85 01/19/2023   TRIG 95.0 01/19/2023   HDL 31.60 (L) 01/19/2023   LDLDIRECT 40.0 01/19/2023   LDLCALC 35 01/19/2023   ALT 6 01/19/2023   AST 19 01/19/2023   NA 136 01/19/2023   K 4.6 01/19/2023   CL 98 01/19/2023   CREATININE 0.91 01/19/2023   BUN 28 (H) 01/19/2023   CO2 30 01/19/2023   TSH 0.90 01/19/2023   INR 0.9 04/21/2022   HGBA1C 4.9 01/19/2023   MICROALBUR 1.1 01/19/2023    DG Chest Port 1 View  Result Date: 05/10/2021 CLINICAL DATA:  Abnormal chest x-ray. EXAM: PORTABLE CHEST 1 VIEW COMPARISON:  Radiograph 05/05/2021.  CT 05/08/2021 FINDINGS: Worsening lung aeration with progressive patchy opacities involving the right greater than left lung. Small pleural effusions have increased. Heart is normal in size, stable mediastinal contours. No pneumothorax. Again seen eventration of right hemidiaphragm. IMPRESSION: Worsening lung aeration with progressive patchy opacities involving the right greater than left lung. Findings may favor multifocal pneumonia over pulmonary edema. Small bilateral pleural effusions. Electronically Signed   By: Narda Rutherford M.D.   On: 05/10/2021 17:25   ECHOCARDIOGRAM COMPLETE  Result Date: 05/10/2021    ECHOCARDIOGRAM REPORT   Patient Name:   Kim Santiago Date of Exam: 05/10/2021 Medical Rec #:  [id number]        Height:       63.0 in Accession #:    [id number]       Weight:       143.3 lb Date of Birth:  1934/09/22        BSA:          1.678 m Patient Age:    86 years         BP:           169/79 mmHg Patient Gender: F                HR:           99 bpm. Exam Location:  [facility] Procedure: 2D Echo and Strain Analysis Indications:     CHF Acute Systolic I50.21   History:         Patient has prior history of Echocardiogram examinations, most                  recent 11/08/2018.  Sonographer:     Overton Mam RDCS Referring Phys:  [id number] Vida Rigger Diagnosing Phys: Debbe Odea MD  Sonographer Comments: Global longitudinal strain was attempted. IMPRESSIONS  1. Left ventricular ejection fraction, by estimation, is 55 to 60%. The left ventricle has normal function. The left ventricle has no regional wall motion abnormalities. Left ventricular diastolic parameters were normal.  2. Right ventricular systolic function is normal. The right ventricular size is normal.  3. The mitral valve is normal in structure. No evidence of mitral valve regurgitation.  4. The aortic valve is tricuspid. Aortic valve regurgitation is not visualized.  5. The inferior vena cava is normal in size with greater than 50% respiratory variability, suggesting right atrial pressure of 3 mmHg. FINDINGS  Left Ventricle: Left ventricular ejection fraction, by estimation, is 55 to 60%. The left ventricle has normal function. The left ventricle has no regional wall motion abnormalities. Global longitudinal strain performed but not reported based on interpreter judgement due to suboptimal tracking. The left ventricular internal cavity size was normal in size. There  HCT 28.3 (L) 01/19/2023   PLT 142.0 (L) 01/19/2023   GLUCOSE 103 (H) 01/19/2023   CHOL 85 01/19/2023   TRIG 95.0 01/19/2023   HDL 31.60 (L) 01/19/2023   LDLDIRECT 40.0 01/19/2023   LDLCALC 35 01/19/2023   ALT 6 01/19/2023   AST 19 01/19/2023   NA 136 01/19/2023   K 4.6 01/19/2023   CL 98 01/19/2023   CREATININE 0.91 01/19/2023   BUN 28 (H) 01/19/2023   CO2 30 01/19/2023   TSH 0.90 01/19/2023   INR 0.9 04/21/2022   HGBA1C 4.9 01/19/2023   MICROALBUR 1.1 01/19/2023    DG Chest Port 1 View  Result Date: 05/10/2021 CLINICAL DATA:  Abnormal chest x-ray. EXAM: PORTABLE CHEST 1 VIEW COMPARISON:  Radiograph 05/05/2021.  CT 05/08/2021 FINDINGS: Worsening lung aeration with progressive patchy opacities involving the right greater than left lung. Small pleural effusions have increased. Heart is normal in size, stable mediastinal contours. No pneumothorax. Again seen eventration of right hemidiaphragm. IMPRESSION: Worsening lung aeration with progressive patchy opacities involving the right greater than left lung. Findings may favor multifocal pneumonia over pulmonary edema. Small bilateral pleural effusions. Electronically Signed   By: Narda Rutherford M.D.   On: 05/10/2021 17:25   ECHOCARDIOGRAM COMPLETE  Result Date: 05/10/2021    ECHOCARDIOGRAM REPORT   Patient Name:   Kim Santiago Date of Exam: 05/10/2021 Medical Rec #:  [id number]        Height:       63.0 in Accession #:    [id number]       Weight:       143.3 lb Date of Birth:  1934/09/22        BSA:          1.678 m Patient Age:    86 years         BP:           169/79 mmHg Patient Gender: F                HR:           99 bpm. Exam Location:  [facility] Procedure: 2D Echo and Strain Analysis Indications:     CHF Acute Systolic I50.21   History:         Patient has prior history of Echocardiogram examinations, most                  recent 11/08/2018.  Sonographer:     Overton Mam RDCS Referring Phys:  [id number] Vida Rigger Diagnosing Phys: Debbe Odea MD  Sonographer Comments: Global longitudinal strain was attempted. IMPRESSIONS  1. Left ventricular ejection fraction, by estimation, is 55 to 60%. The left ventricle has normal function. The left ventricle has no regional wall motion abnormalities. Left ventricular diastolic parameters were normal.  2. Right ventricular systolic function is normal. The right ventricular size is normal.  3. The mitral valve is normal in structure. No evidence of mitral valve regurgitation.  4. The aortic valve is tricuspid. Aortic valve regurgitation is not visualized.  5. The inferior vena cava is normal in size with greater than 50% respiratory variability, suggesting right atrial pressure of 3 mmHg. FINDINGS  Left Ventricle: Left ventricular ejection fraction, by estimation, is 55 to 60%. The left ventricle has normal function. The left ventricle has no regional wall motion abnormalities. Global longitudinal strain performed but not reported based on interpreter judgement due to suboptimal tracking. The left ventricular internal cavity size was normal in size. There  HCT 28.3 (L) 01/19/2023   PLT 142.0 (L) 01/19/2023   GLUCOSE 103 (H) 01/19/2023   CHOL 85 01/19/2023   TRIG 95.0 01/19/2023   HDL 31.60 (L) 01/19/2023   LDLDIRECT 40.0 01/19/2023   LDLCALC 35 01/19/2023   ALT 6 01/19/2023   AST 19 01/19/2023   NA 136 01/19/2023   K 4.6 01/19/2023   CL 98 01/19/2023   CREATININE 0.91 01/19/2023   BUN 28 (H) 01/19/2023   CO2 30 01/19/2023   TSH 0.90 01/19/2023   INR 0.9 04/21/2022   HGBA1C 4.9 01/19/2023   MICROALBUR 1.1 01/19/2023    DG Chest Port 1 View  Result Date: 05/10/2021 CLINICAL DATA:  Abnormal chest x-ray. EXAM: PORTABLE CHEST 1 VIEW COMPARISON:  Radiograph 05/05/2021.  CT 05/08/2021 FINDINGS: Worsening lung aeration with progressive patchy opacities involving the right greater than left lung. Small pleural effusions have increased. Heart is normal in size, stable mediastinal contours. No pneumothorax. Again seen eventration of right hemidiaphragm. IMPRESSION: Worsening lung aeration with progressive patchy opacities involving the right greater than left lung. Findings may favor multifocal pneumonia over pulmonary edema. Small bilateral pleural effusions. Electronically Signed   By: Narda Rutherford M.D.   On: 05/10/2021 17:25   ECHOCARDIOGRAM COMPLETE  Result Date: 05/10/2021    ECHOCARDIOGRAM REPORT   Patient Name:   Kim Santiago Date of Exam: 05/10/2021 Medical Rec #:  [id number]        Height:       63.0 in Accession #:    [id number]       Weight:       143.3 lb Date of Birth:  1934/09/22        BSA:          1.678 m Patient Age:    86 years         BP:           169/79 mmHg Patient Gender: F                HR:           99 bpm. Exam Location:  [facility] Procedure: 2D Echo and Strain Analysis Indications:     CHF Acute Systolic I50.21   History:         Patient has prior history of Echocardiogram examinations, most                  recent 11/08/2018.  Sonographer:     Overton Mam RDCS Referring Phys:  [id number] Vida Rigger Diagnosing Phys: Debbe Odea MD  Sonographer Comments: Global longitudinal strain was attempted. IMPRESSIONS  1. Left ventricular ejection fraction, by estimation, is 55 to 60%. The left ventricle has normal function. The left ventricle has no regional wall motion abnormalities. Left ventricular diastolic parameters were normal.  2. Right ventricular systolic function is normal. The right ventricular size is normal.  3. The mitral valve is normal in structure. No evidence of mitral valve regurgitation.  4. The aortic valve is tricuspid. Aortic valve regurgitation is not visualized.  5. The inferior vena cava is normal in size with greater than 50% respiratory variability, suggesting right atrial pressure of 3 mmHg. FINDINGS  Left Ventricle: Left ventricular ejection fraction, by estimation, is 55 to 60%. The left ventricle has normal function. The left ventricle has no regional wall motion abnormalities. Global longitudinal strain performed but not reported based on interpreter judgement due to suboptimal tracking. The left ventricular internal cavity size was normal in size. There  Subjective:  Patient ID: Kim Santiago, female    DOB: May 24, 1935  Age: 88 y.o. MRN: [id number]  CC: {There were no encounter diagnoses. (Refresh or delete this SmartLink)}   HPI Kim Santiago presents for No chief complaint on file.   1) leg swelling: patient  has been prescribed  furosemide , torsemide and spironolactone  in the past   for lower extremity edema attributed to non cardiac causes (reviewed Sept 2022 ECHO) .  In May evaluation I noted that She had been excessively diuresed based on change in GFR from Oct to February using torsemide only. She is only using only furosemide daily and le edema is under control.  She feels fine but has tenting on exam.      IDA:  has stopped taking iron supplement  due to constipation several weeks ago   Chronic back pain :  history of T8 vertebral fractures and L1 vertebral fracture with kyphoplasty (remote_ .  Currenlty her pain ins upper back on the left side and has been present on an intermittent basis for months.  Aggravated by walking and houseowrk.      Outpatient Medications Prior to Visit  Medication Sig Dispense Refill   atorvastatin (LIPITOR) 20 MG tablet TAKE 1 TABLET BY MOUTH EVERY DAY AT 6 PM 90 tablet 3   Calcium Carbonate-Vitamin D 600-200 MG-UNIT TABS Take 2 tablets by mouth daily.     cholecalciferol (VITAMIN D) 1000 UNITS tablet Take 2,000 Units by mouth daily.     clobetasol ointment (TEMOVATE) 0.05 % Apply topically. (Patient not taking: Reported on 04/16/2023)     CRANBERRY PO Take 1 capsule by mouth 2 (two) times daily.     ferrous gluconate (FERGON) 324 MG tablet Take 1 tablet (324 mg total) by mouth daily with breakfast. (Patient not taking: Reported on 04/16/2023) 90 tablet 1   folic acid (FOLVITE) 1 MG tablet Take 2 mg by mouth daily.     furosemide (LASIX) 20 MG tablet TAKE 1 TABLET BY MOUTH EVERY OTHER DAY AS NEEDED 45 tablet 2   levothyroxine (SYNTHROID) 75 MCG tablet TAKE 1 TABLET BY MOUTH ( TOTAL) DAILY  90 tablet 2   losartan (COZAAR) 25 MG tablet TAKE 1 TABLET BY MOUTH EVERY DAY 90 tablet 1   Melatonin 10 MG TABS Take 1 tablet by mouth daily at 2 am. (Patient not taking: Reported on 04/16/2023)     methotrexate (RHEUMATREX) 2.5 MG tablet Take 15 mg by mouth once a week.     metolazone (ZAROXOLYN) 2.5 MG tablet Take 2.5 mg by mouth every other day. (Patient not taking: Reported on 04/16/2023)     metoprolol succinate (TOPROL-XL) 25 MG 24 hr tablet TAKE 1 TABLET BY MOUTH EVERY DAY 90 tablet 3   nitroGLYCERIN (NITROSTAT) 0.4 MG SL tablet Place 1 tablet (0.4 mg total) under the tongue every 5 (five) minutes as needed for chest pain. Maximum 3 doses (Patient not taking: Reported on 04/16/2023) 50 tablet 3   omeprazole (PRILOSEC) 20 MG capsule Take 1 capsule (20 mg total) by mouth 2 (two) times daily before a meal. 180 capsule 1   polyethylene glycol (MIRALAX / GLYCOLAX) packet Take 17 g by mouth daily.     predniSONE (DELTASONE) 5 MG tablet Take 5 mg by mouth daily with breakfast.     Probiotic Product (PROBIOTIC PO) Take 1 tablet by mouth daily.     sertraline (ZOLOFT) 25 MG tablet Take 1 tablet (25 mg total) by mouth  Subjective:  Patient ID: Kim Santiago, female    DOB: May 24, 1935  Age: 88 y.o. MRN: [id number]  CC: {There were no encounter diagnoses. (Refresh or delete this SmartLink)}   HPI Kim Santiago presents for No chief complaint on file.   1) leg swelling: patient  has been prescribed  furosemide , torsemide and spironolactone  in the past   for lower extremity edema attributed to non cardiac causes (reviewed Sept 2022 ECHO) .  In May evaluation I noted that She had been excessively diuresed based on change in GFR from Oct to February using torsemide only. She is only using only furosemide daily and le edema is under control.  She feels fine but has tenting on exam.      IDA:  has stopped taking iron supplement  due to constipation several weeks ago   Chronic back pain :  history of T8 vertebral fractures and L1 vertebral fracture with kyphoplasty (remote_ .  Currenlty her pain ins upper back on the left side and has been present on an intermittent basis for months.  Aggravated by walking and houseowrk.      Outpatient Medications Prior to Visit  Medication Sig Dispense Refill   atorvastatin (LIPITOR) 20 MG tablet TAKE 1 TABLET BY MOUTH EVERY DAY AT 6 PM 90 tablet 3   Calcium Carbonate-Vitamin D 600-200 MG-UNIT TABS Take 2 tablets by mouth daily.     cholecalciferol (VITAMIN D) 1000 UNITS tablet Take 2,000 Units by mouth daily.     clobetasol ointment (TEMOVATE) 0.05 % Apply topically. (Patient not taking: Reported on 04/16/2023)     CRANBERRY PO Take 1 capsule by mouth 2 (two) times daily.     ferrous gluconate (FERGON) 324 MG tablet Take 1 tablet (324 mg total) by mouth daily with breakfast. (Patient not taking: Reported on 04/16/2023) 90 tablet 1   folic acid (FOLVITE) 1 MG tablet Take 2 mg by mouth daily.     furosemide (LASIX) 20 MG tablet TAKE 1 TABLET BY MOUTH EVERY OTHER DAY AS NEEDED 45 tablet 2   levothyroxine (SYNTHROID) 75 MCG tablet TAKE 1 TABLET BY MOUTH ( TOTAL) DAILY  90 tablet 2   losartan (COZAAR) 25 MG tablet TAKE 1 TABLET BY MOUTH EVERY DAY 90 tablet 1   Melatonin 10 MG TABS Take 1 tablet by mouth daily at 2 am. (Patient not taking: Reported on 04/16/2023)     methotrexate (RHEUMATREX) 2.5 MG tablet Take 15 mg by mouth once a week.     metolazone (ZAROXOLYN) 2.5 MG tablet Take 2.5 mg by mouth every other day. (Patient not taking: Reported on 04/16/2023)     metoprolol succinate (TOPROL-XL) 25 MG 24 hr tablet TAKE 1 TABLET BY MOUTH EVERY DAY 90 tablet 3   nitroGLYCERIN (NITROSTAT) 0.4 MG SL tablet Place 1 tablet (0.4 mg total) under the tongue every 5 (five) minutes as needed for chest pain. Maximum 3 doses (Patient not taking: Reported on 04/16/2023) 50 tablet 3   omeprazole (PRILOSEC) 20 MG capsule Take 1 capsule (20 mg total) by mouth 2 (two) times daily before a meal. 180 capsule 1   polyethylene glycol (MIRALAX / GLYCOLAX) packet Take 17 g by mouth daily.     predniSONE (DELTASONE) 5 MG tablet Take 5 mg by mouth daily with breakfast.     Probiotic Product (PROBIOTIC PO) Take 1 tablet by mouth daily.     sertraline (ZOLOFT) 25 MG tablet Take 1 tablet (25 mg total) by mouth

## 2023-06-03 LAB — HEPATIC FUNCTION PANEL
ALT: 5 U/L (ref 0–35)
AST: 12 U/L (ref 0–37)
Albumin: 4 g/dL (ref 3.5–5.2)
Alkaline Phosphatase: 69 U/L (ref 39–117)
Bilirubin, Direct: 0.2 mg/dL (ref 0.0–0.3)
Total Bilirubin: 0.8 mg/dL (ref 0.2–1.2)
Total Protein: 6 g/dL (ref 6.0–8.3)

## 2023-06-03 LAB — CBC WITH DIFFERENTIAL/PLATELET
Basophils Absolute: 0.1 10*3/uL (ref 0.0–0.1)
Basophils Relative: 1.2 % (ref 0.0–3.0)
Eosinophils Absolute: 0.1 10*3/uL (ref 0.0–0.7)
Eosinophils Relative: 1.2 % (ref 0.0–5.0)
HCT: 30.5 % — ABNORMAL LOW (ref 36.0–46.0)
Hemoglobin: 10.5 g/dL — ABNORMAL LOW (ref 12.0–15.0)
Lymphocytes Relative: 17.1 % (ref 12.0–46.0)
Lymphs Abs: 0.7 10*3/uL (ref 0.7–4.0)
MCHC: 34.2 g/dL (ref 30.0–36.0)
MCV: 115.9 fl — ABNORMAL HIGH (ref 78.0–100.0)
Monocytes Absolute: 0.2 10*3/uL (ref 0.1–1.0)
Monocytes Relative: 5.7 % (ref 3.0–12.0)
Neutro Abs: 3.2 10*3/uL (ref 1.4–7.7)
Neutrophils Relative %: 74.8 % (ref 43.0–77.0)
Platelets: 154 10*3/uL (ref 150.0–400.0)
RBC: 2.64 Mil/uL — ABNORMAL LOW (ref 3.87–5.11)
RDW: 18 % — ABNORMAL HIGH (ref 11.5–15.5)
WBC: 4.3 10*3/uL (ref 4.0–10.5)

## 2023-06-03 LAB — BASIC METABOLIC PANEL
BUN: 18 mg/dL (ref 6–23)
CO2: 30 mEq/L (ref 19–32)
Calcium: 8.9 mg/dL (ref 8.4–10.5)
Chloride: 99 mEq/L (ref 96–112)
Creatinine, Ser: 0.83 mg/dL (ref 0.40–1.20)
GFR: 63.84 mL/min (ref >60.00)
Glucose, Bld: 105 mg/dL — ABNORMAL HIGH (ref 70–99)
Potassium: 4.6 mEq/L (ref 3.5–5.1)
Sodium: 137 mEq/L (ref 135–145)

## 2023-06-03 LAB — B12 AND FOLATE PANEL
Folate: >24.2 ng/mL (ref >5.9)
Vitamin B-12: 865 pg/mL (ref 211–911)

## 2023-06-03 LAB — IRON,TIBC AND FERRITIN PANEL
%SAT: 25 % (calc) (ref 16–45)
Ferritin: 423 ng/mL — ABNORMAL HIGH (ref 16–288)
Iron: 55 ug/dL (ref 45–160)
TIBC: 224 mcg/dL (calc) — ABNORMAL LOW (ref 250–450)

## 2023-06-03 NOTE — Assessment & Plan Note (Signed)
Currently resolved.  Will repeat CBC In 3 months  she prefers to follow up with hematology only if counts worsen  Lab Results  Component Value Date   WBC 4.3 06/02/2023   HGB 10.5 (L) 06/02/2023   HCT 30.5 (L) 06/02/2023   MCV 115.9 Repeated and verified X2. (H) 06/02/2023   PLT 154.0 06/02/2023

## 2023-06-03 NOTE — Assessment & Plan Note (Addendum)
Reviewed most recent note from Dr Orma Flaming   she is Currently taking prednisone , triamcinolone and 20 MTX weekly with no recent flares.  -

## 2023-06-03 NOTE — Assessment & Plan Note (Signed)
Mild, and stable, y due to chronic disease.  Iron stores are normal  as are b12 and folate    Lab Results  Component Value Date   IRON 55 06/02/2023   TIBC 224 (L) 06/02/2023   FERRITIN 423 (H) 06/02/2023   Last vitamin B12 and Folate Lab Results  Component Value Date   VITAMINB12 865 06/02/2023   FOLATE >24.2 06/02/2023    Lab Results  Component Value Date   WBC 4.3 06/02/2023   HGB 10.5 (L) 06/02/2023   HCT 30.5 (L) 06/02/2023   MCV 115.9 Repeated and verified X2. (H) 06/02/2023   PLT 154.0 06/02/2023

## 2023-06-03 NOTE — Assessment & Plan Note (Signed)
Her edema is now controlled with use of low dose furosemide daily .  Cr has returned to normal and potassium level is fine without supplementation  Lab Results  Component Value Date   CREATININE 0.83 06/02/2023   Lab Results  Component Value Date   NA 137 06/02/2023   K 4.6 06/02/2023   CL 99 06/02/2023   CO2 30 06/02/2023

## 2023-06-03 NOTE — Assessment & Plan Note (Signed)
She is no longer taking irone supplements due to intolerance (constipation).  Iron stores are currently normal and hgvb is stable.  Will repeat in 3 months

## 2023-06-21 ENCOUNTER — Other Ambulatory Visit: Payer: Self-pay | Admitting: Internal Medicine

## 2023-06-23 DIAGNOSIS — H524 Presbyopia: Secondary | ICD-10-CM | POA: Diagnosis not present

## 2023-06-23 DIAGNOSIS — H353131 Nonexudative age-related macular degeneration, bilateral, early dry stage: Secondary | ICD-10-CM | POA: Diagnosis not present

## 2023-06-23 DIAGNOSIS — H04123 Dry eye syndrome of bilateral lacrimal glands: Secondary | ICD-10-CM | POA: Diagnosis not present

## 2023-06-23 DIAGNOSIS — M3500 Sicca syndrome, unspecified: Secondary | ICD-10-CM | POA: Diagnosis not present

## 2023-06-23 DIAGNOSIS — H52223 Regular astigmatism, bilateral: Secondary | ICD-10-CM | POA: Diagnosis not present

## 2023-06-23 DIAGNOSIS — H5203 Hypermetropia, bilateral: Secondary | ICD-10-CM | POA: Diagnosis not present

## 2023-06-23 DIAGNOSIS — Z961 Presence of intraocular lens: Secondary | ICD-10-CM | POA: Diagnosis not present

## 2023-06-24 ENCOUNTER — Other Ambulatory Visit: Payer: Self-pay | Admitting: Internal Medicine

## 2023-06-27 DIAGNOSIS — I69354 Hemiplegia and hemiparesis following cerebral infarction affecting left non-dominant side: Secondary | ICD-10-CM | POA: Diagnosis not present

## 2023-06-27 DIAGNOSIS — D849 Immunodeficiency, unspecified: Secondary | ICD-10-CM | POA: Diagnosis not present

## 2023-06-27 DIAGNOSIS — I509 Heart failure, unspecified: Secondary | ICD-10-CM | POA: Diagnosis not present

## 2023-06-27 DIAGNOSIS — M329 Systemic lupus erythematosus, unspecified: Secondary | ICD-10-CM | POA: Diagnosis not present

## 2023-06-27 DIAGNOSIS — J329 Chronic sinusitis, unspecified: Secondary | ICD-10-CM | POA: Diagnosis not present

## 2023-06-27 DIAGNOSIS — I110 Hypertensive heart disease with heart failure: Secondary | ICD-10-CM | POA: Diagnosis not present

## 2023-06-30 ENCOUNTER — Other Ambulatory Visit: Payer: Self-pay | Admitting: Internal Medicine

## 2023-07-02 DIAGNOSIS — J329 Chronic sinusitis, unspecified: Secondary | ICD-10-CM | POA: Diagnosis not present

## 2023-07-05 ENCOUNTER — Other Ambulatory Visit: Payer: Self-pay | Admitting: Internal Medicine

## 2023-07-05 DIAGNOSIS — E034 Atrophy of thyroid (acquired): Secondary | ICD-10-CM

## 2023-07-11 ENCOUNTER — Ambulatory Visit
Admission: EM | Admit: 2023-07-11 | Discharge: 2023-07-11 | Disposition: A | Discharge status: Home / Self Care | Payer: PPO | Attending: Emergency Medicine | Admitting: Emergency Medicine

## 2023-07-11 DIAGNOSIS — I129 Hypertensive chronic kidney disease with stage 1 through stage 4 chronic kidney disease, or unspecified chronic kidney disease: Secondary | ICD-10-CM | POA: Diagnosis not present

## 2023-07-11 DIAGNOSIS — E785 Hyperlipidemia, unspecified: Secondary | ICD-10-CM | POA: Diagnosis not present

## 2023-07-11 DIAGNOSIS — N390 Urinary tract infection, site not specified: Secondary | ICD-10-CM | POA: Insufficient documentation

## 2023-07-11 DIAGNOSIS — R3915 Urgency of urination: Secondary | ICD-10-CM | POA: Insufficient documentation

## 2023-07-11 DIAGNOSIS — Z85850 Personal history of malignant neoplasm of thyroid: Secondary | ICD-10-CM | POA: Insufficient documentation

## 2023-07-11 DIAGNOSIS — M3500 Sicca syndrome, unspecified: Secondary | ICD-10-CM | POA: Diagnosis not present

## 2023-07-11 DIAGNOSIS — N189 Chronic kidney disease, unspecified: Secondary | ICD-10-CM | POA: Insufficient documentation

## 2023-07-11 LAB — URINALYSIS, W/ REFLEX TO CULTURE (INFECTION SUSPECTED)
Bilirubin Urine: NEGATIVE
Glucose, UA: NEGATIVE mg/dL
Ketones, ur: NEGATIVE mg/dL
Nitrite: NEGATIVE
Protein, ur: NEGATIVE mg/dL
Specific Gravity, Urine: 1.015 (ref 1.005–1.030)
WBC, UA: >50 WBC/hpf (ref 0–5)
pH: 6.5 (ref 5.0–8.0)

## 2023-07-11 MED ORDER — NITROFURANTOIN MONOHYD MACRO 100 MG PO CAPS: 100.0000 mg | ORAL_CAPSULE | Freq: Two times a day (BID) | ORAL | 0 refills | Status: DC

## 2023-07-11 MED ORDER — PHENAZOPYRIDINE HCL 200 MG PO TABS: 200.0000 mg | ORAL_TABLET | Freq: Three times a day (TID) | ORAL | 0 refills | Status: DC

## 2023-07-11 NOTE — ED Triage Notes (Signed)
Pt c/o urinary freq,burning & pain x1 day. Denies any hematuria.  

## 2023-07-11 NOTE — Discharge Instructions (Addendum)
Take the Macrobid twice daily for 5 days with food for treatment of urinary tract infection. ° °Use the Pyridium every 8 hours as needed for urinary discomfort.  This will turn your urine a bright red-orange. ° °Increase your oral fluid intake so that you increase your urine production and or flushing your urinary system. ° °Take an over-the-counter probiotic, such as Culturelle-Align-Activia, 1 hour after each dose of antibiotic to prevent diarrhea or yeast infections from forming. ° °We will culture urine and change the antibiotics if necessary. ° °Return for reevaluation, or see your primary care provider, for any new or worsening symptoms.  °

## 2023-07-11 NOTE — ED Provider Notes (Signed)
[facility]    CSN: [id number] Arrival date & time: 07/11/23  1010      History   Chief Complaint Chief Complaint  Patient presents with   Dysuria    HPI Kim Santiago is a 88 y.o. female.   HPI  88 year old female with a past medical history significant for hypertension, hyperlipidemia, Sjogren syndrome, and thyroid cancer presents for evaluation of burning with urination along with urinary urgency and frequency that started last night.  She denies any increase in nocturia, blood in her urine, or fever.  She has had UTIs in the past but her most recent one was 3 years ago.  Past Medical History:  Diagnosis Date   Arthritis    Breast cancer (HCC) 2017   left mastectomy done 11/2015   Breast cancer in female [facility]) 11/18/2015   Left: 3.9 cm tumor, T2, 1/2 sentinel nodes positive for macro metastatic disease, N1, 3 negative nodes in the axillary tail, ER+,PR+, Her 2 neu, low Mammoprint score   Cancer (HCC)    thyroid takes levothyroxine   CAP (community acquired pneumonia) 05/05/2021   Chronic kidney disease    UTI   Genetic screening 11/2015   Mammoprint of left breast cancer: Low risk for recurrence.    History of heart attack 06/12/2014   Hypertension    hypothyroidism    secondary to thyroidectomy for thyroid ca   Hypothyroidism    Lupus    subcutaneous   Menopause 40s   natural, hot flashes and mood lability now gone, off prempro 7 months   Myocardial infarction [facility]) 2013   Osteoporosis    Osteopenia   Rosacea    Sjoegren syndrome    Stress-induced cardiomyopathy September of 2013   EF 35%. Peak troponin was 1.8.   Stroke [facility]) 10/2018    Patient Active Problem List   Diagnosis Date Noted   Gait instability 04/21/2022   Pancytopenia (HCC) 12/27/2021   Iron deficiency anemia 12/23/2021   Frequent epistaxis 12/23/2021   Chronic diastolic heart failure (HCC) 06/19/2021   COVID-19 virus infection 04/20/2021   Generalized weakness 11/23/2020    Vaccine reaction, initial encounter 08/20/2020   Other drug-induced neutropenia (HCC) 01/16/2020   Hemiplegia affecting dominant side, post-stroke (HCC) 01/16/2020   Does use hearing aid 07/14/2019   Hypothyroidism due to acquired atrophy of thyroid 04/08/2019   Impaired balance as late effect of cerebrovascular accident 11/13/2018   History of CVA with residual deficit 11/13/2018   Leukopenia 11/11/2018   Anxiety about health 07/05/2018   Mediastinal adenopathy 04/27/2018   Edema due to hypoalbuminemia 02/20/2018   Hospital discharge follow-up 02/20/2018   History of vertebral fracture 02/18/2018   GERD (gastroesophageal reflux disease) 10/31/2017   Hand joint pain 06/13/2017   Vaginal atrophy 05/11/2017   Venous insufficiency of both lower extremities 01/10/2017   Subacute cutaneous lupus erythematosus 06/26/2016   Anemia, chronic disease 05/24/2016   Recurrent UTI 04/29/2016   TMJ syndrome 03/31/2016   Hearing loss 03/12/2016   Osteopenia 12/05/2015   Malignant neoplasm of left female breast (HCC) 11/26/2015   Hx of thyroid cancer 06/12/2014   Adenomatous polyp 06/12/2014   Major depressive disorder, recurrent episode, moderate (HCC) 05/29/2014   Grief 04/08/2014   Sjogren's syndrome (HCC) 03/09/2014   Insomnia due to stress 03/06/2014   Hyperlipidemia 12/28/2012   Medicare annual wellness visit, subsequent 12/28/2012   Essential hypertension 10/02/2011   Iatrogenic hypothyroidism 10/02/2011   Purpura (HCC) 05/25/2011    Past Surgical History:  Procedure Laterality Date   BACK SURGERY     BREAST BIOPSY Left 10/30/15   positive, done in Dr. Rutherford Nail office   CARDIAC CATHETERIZATION  05/2012   [facility]. No significant CAD. Ejection fraction of 35% due to stress-induced cardiomyopathy.   CHOLECYSTECTOMY     COLONOSCOPY     DILATION AND CURETTAGE OF UTERUS     KYPHOSIS SURGERY  Feb 2008   L1, Dr. Gerrit Heck   LUMBAR DISC SURGERY     L4-L5   MASTECTOMY Left 11/18/2015    positive   SENTINEL NODE BIOPSY Left 11/18/2015   Procedure: SENTINEL NODE BIOPSY;  Surgeon: Earline Mayotte, MD;  Location: [facility] ORS;  Service: General;  Laterality: Left;   SHOULDER ARTHROSCOPY  2004   Left, Dr. Gavin Potters   SIMPLE MASTECTOMY WITH AXILLARY SENTINEL NODE BIOPSY Left 11/18/2015   Procedure: SIMPLE MASTECTOMY;  Surgeon: Earline Mayotte, MD;  Location: [facility] ORS;  Service: General;  Laterality: Left;   SPINE SURGERY     L4-5 diskectomy   THYROIDECTOMY     Thyroid Cancer   TONSILLECTOMY     TUBAL LIGATION      OB History     Gravida  0   Para  0   Term  0   Preterm  0   AB  0   Living  0      SAB  0   IAB  0   Ectopic  0   Multiple  0   Live Births           Obstetric Comments  1st Menstrual Cycle:  14 1 adopted child            Home Medications    Prior to Admission medications   Medication Sig Start Date End Date Taking? Authorizing Provider  atorvastatin (LIPITOR) 20 MG tablet TAKE 1 TABLET BY MOUTH EVERY DAY AT 6 PM 01/19/23  Yes Sherlene Shams, MD  Calcium Carbonate-Vitamin D 600-200 MG-UNIT TABS Take 2 tablets by mouth daily.   Yes [provider]  cholecalciferol (VITAMIN D) 1000 UNITS tablet Take 2,000 Units by mouth daily.   Yes [provider]  CRANBERRY PO Take 1 capsule by mouth 2 (two) times daily.   Yes [provider]  fluticasone (FLONASE) 50 MCG/ACT nasal spray Place 1 spray into both nostrils daily. 06/27/23  Yes [provider]  folic acid (FOLVITE) 1 MG tablet Take 2 mg by mouth daily. 08/13/21  Yes [provider]  furosemide (LASIX) 20 MG tablet TAKE 1 TABLET BY MOUTH EVERY OTHER DAY AS NEEDED 01/14/23  Yes Sherlene Shams, MD  levothyroxine (SYNTHROID) 75 MCG tablet TAKE 1 TABLET BY MOUTH ( TOTAL) DAILY 07/05/23  Yes Sherlene Shams, MD  losartan (COZAAR) 25 MG tablet TAKE 1 TABLET BY MOUTH EVERY DAY 12/25/22  Yes Sherlene Shams, MD  meloxicam (MOBIC) 15 MG tablet TAKE  1 TABLET (15 MG TOTAL) BY MOUTH DAILY. 06/30/23  Yes Sherlene Shams, MD  methotrexate (RHEUMATREX) 2.5 MG tablet Take 15 mg by mouth once a week. 08/08/21  Yes [provider]  metoprolol succinate (TOPROL-XL) 25 MG 24 hr tablet TAKE 1 TABLET BY MOUTH EVERY DAY 06/22/23  Yes Sherlene Shams, MD  nitrofurantoin, macrocrystal-monohydrate, (MACROBID) 100 MG capsule Take 1 capsule (100 mg total) by mouth 2 (two) times daily. 07/11/23  Yes Becky Augusta, NP  omeprazole (PRILOSEC) 20 MG capsule TAKE 1 CAPSULE (20 MG TOTAL) BY MOUTH 2 (TWO)  TIMES DAILY BEFORE A MEAL. 06/24/23  Yes Sherlene Shams, MD  phenazopyridine (PYRIDIUM) 200 MG tablet Take 1 tablet (200 mg total) by mouth 3 (three) times daily. 07/11/23  Yes Becky Augusta, NP  polyethylene glycol (MIRALAX / GLYCOLAX) packet Take 17 g by mouth daily.   Yes [provider]  predniSONE (DELTASONE) 5 MG tablet Take 5 mg by mouth daily with breakfast.   Yes [provider]  Probiotic Product (PROBIOTIC PO) Take 1 tablet by mouth daily.   Yes [provider]  sertraline (ZOLOFT) 25 MG tablet Take 1 tablet (25 mg total) by mouth daily. 01/19/23  Yes Sherlene Shams, MD  sodium fluoride (PREVIDENT 5000 PLUS) 1.1 % CREA dental cream Take 1 application by mouth daily.  09/15/18  Yes [provider]  tiZANidine (ZANAFLEX) 4 MG tablet Take 1 tablet (4 mg total) by mouth every 6 (six) hours as needed for muscle spasms. 06/02/23  Yes Sherlene Shams, MD  triamcinolone cream (KENALOG) 0.1 % APPLY TO AFFECTED AREA TWICE A DAY 03/21/21  Yes [provider]    Family History Family History  Problem Relation Age of Onset   Kidney disease Mother    Heart disease Mother    COPD Father    Cancer Father        esophageal   Kidney disease Sister    Cancer Brother 87       colon cancer (both brothers)   Cancer Brother    Heart attack Brother 31   Heart disease Brother    Breast cancer Neg Hx     Social  History Social History   Tobacco Use   Smoking status: Never   Smokeless tobacco: Never  Vaping Use   Vaping status: Never Used  Substance Use Topics   Alcohol use: No   Drug use: No     Allergies   Azathioprine, Hydroxychloroquine, Mycophenolate mofetil, Amoxicillin, Codeine, Naprosyn [naproxen], Orudis [ketoprofen], Sulfa antibiotics, and Sulfathiazole   Review of Systems Review of Systems  Constitutional:  Negative for fever.  Genitourinary:  Positive for dysuria, frequency and urgency. Negative for hematuria.  Musculoskeletal:  Negative for back pain.     Physical Exam Triage Vital Signs ED Triage Vitals [07/11/23 1106]  Encounter Vitals Group     BP      Systolic BP Percentile      Diastolic BP Percentile      Pulse      Resp 16     Temp      Temp Source Oral     SpO2      Weight      Height      Head Circumference      Peak Flow      Pain Score      Pain Loc      Pain Education      Exclude from Growth Chart    No data found.  Updated Vital Signs BP (!) 166/75 (BP Location: Left Arm)   Pulse 72   Temp 99.1 F (37.3 C) (Oral)   Resp 16   Ht 5\' 4"  (1.626 m)   Wt 138 lb (62.6 kg)   SpO2 97%   BMI 23.69 kg/m   Visual Acuity Right Eye Distance:   Left Eye Distance:   Bilateral Distance:    Right Eye Near:   Left Eye Near:    Bilateral Near:     Physical Exam Vitals and nursing note reviewed.  Constitutional:  Appearance: Normal appearance. She is not ill-appearing.  HENT:     Head: Normocephalic and atraumatic.  Cardiovascular:     Rate and Rhythm: Normal rate and regular rhythm.     Pulses: Normal pulses.     Heart sounds: Normal heart sounds. No murmur heard.    No friction rub. No gallop.  Pulmonary:     Effort: Pulmonary effort is normal.     Breath sounds: Normal breath sounds. No wheezing, rhonchi or rales.  Abdominal:     Tenderness: There is left CVA tenderness. There is no right CVA tenderness.     Comments: Patient  reports mild tenderness with palpation of left costovertebral angle.  Skin:    General: Skin is warm and dry.     Capillary Refill: Capillary refill takes less than 2 seconds.     Findings: No rash.  Neurological:     General: No focal deficit present.     Mental Status: She is alert and oriented to person, place, and time.      UC Treatments / Results  Labs (all labs ordered are listed, but only abnormal results are displayed) Labs Reviewed  URINALYSIS, W/ REFLEX TO CULTURE (INFECTION SUSPECTED) - Abnormal; Notable for the following components:      Result Value   APPearance HAZY (*)    Hgb urine dipstick TRACE (*)    Leukocytes,Ua SMALL (*)    Non Squamous Epithelial PRESENT (*)    Bacteria, UA MANY (*)    All other components within normal limits  URINE CULTURE    EKG   Radiology No results found.  Procedures Procedures (including critical care time)  Medications Ordered in UC Medications - No data to display  Initial Impression / Assessment and Plan / UC Course  I have reviewed the triage vital signs and the nursing notes.  Pertinent labs & imaging results that were available during my care of the patient were reviewed by me and considered in my medical decision making (see chart for details).   Patient is a pleasant, nontoxic-appearing 88 year old female presenting for evaluation of UTI symptoms that started last night as outlined HPI above.  She reports that she had been experiencing frequent UTIs but was advised to start taking cranberry tablets and that stopped the recurrence.  Her last UTI was approximately 3 years ago.  On exam she does have some mild CVA tenderness on the left but not on the right.  I will order urinalysis to evaluate for the presence of UTI.  Urinalysis shows hazy appearance with trace hemoglobin and small leukocyte esterase.  Negative for nitrites, protein, or ketones.  Reflex microscopy shows non-squamous epithelials are present along with  greater than 50 WBCs, many bacteria, and WBC clumps.  Urine will reflex to culture.  I will discharge patient home with a diagnosis of urinary tract infection and start her on Macrobid 100 mg twice daily for 5 days along with Pyridium 200 mg every 8 hours as needed for urinary discomfort.  If the urine culture grows out a bacteria that will not be treated by the Macrobid we will contact her by phone and let her know.  She should increase her oral fluid intake so that she increases her urine production and flush her urinary tract.  Return precautions reviewed.   Final Clinical Impressions(s) / UC Diagnoses   Final diagnoses:  Lower urinary tract infectious disease     Discharge Instructions      Take the Macrobid twice daily  for 5 days with food for treatment of urinary tract infection.  Use the Pyridium every 8 hours as needed for urinary discomfort.  This will turn your urine a bright red-orange.  Increase your oral fluid intake so that you increase your urine production and or flushing your urinary system.  Take an over-the-counter probiotic, such as Culturelle-Align-Activia, 1 hour after each dose of antibiotic to prevent diarrhea or yeast infections from forming.  We will culture urine and change the antibiotics if necessary.  Return for reevaluation, or see your primary care provider, for any new or worsening symptoms.      ED Prescriptions     Medication Sig Dispense Auth. Provider   nitrofurantoin, macrocrystal-monohydrate, (MACROBID) 100 MG capsule Take 1 capsule (100 mg total) by mouth 2 (two) times daily. 10 capsule Becky Augusta, NP   phenazopyridine (PYRIDIUM) 200 MG tablet Take 1 tablet (200 mg total) by mouth 3 (three) times daily. 6 tablet Becky Augusta, NP      PDMP not reviewed this encounter.   Becky Augusta, NP 07/11/23 1139

## 2023-07-13 ENCOUNTER — Telehealth: Payer: Self-pay

## 2023-07-13 ENCOUNTER — Encounter: Payer: Self-pay | Admitting: Emergency Medicine

## 2023-07-13 LAB — URINE CULTURE: Culture: >=100000 — AB

## 2023-07-13 MED ORDER — ONDANSETRON HCL 4 MG PO TABS: 4.0000 mg | ORAL_TABLET | Freq: Three times a day (TID) | ORAL | 0 refills | Status: DC | PRN

## 2023-07-13 MED ORDER — CEPHALEXIN 500 MG PO CAPS: 500.0000 mg | ORAL_CAPSULE | Freq: Two times a day (BID) | ORAL | 0 refills | Status: AC

## 2023-07-13 NOTE — Telephone Encounter (Signed)
Per Helaine Chess,  PA-C, "I'm sorry to hear that.  Please send her Zofran 4mg  ODT q8h x 3 days. Thank you!"

## 2023-07-13 NOTE — Telephone Encounter (Signed)
Pt called stating she is having severe nausea. She is unclear if this is related to antibiotics or UTI. Requesting PRN meds.

## 2023-07-13 NOTE — Telephone Encounter (Signed)
Per Helaine Chess,  PA-C, "She tolerates cefazolin (which is parenteral) so she can have keflex 500 mg BID x 7 days (7 days given elevated temp during visit."  Reviewed with patient, verified pharmacy, prescription sent.

## 2023-07-25 ENCOUNTER — Encounter: Payer: Self-pay | Admitting: Emergency Medicine

## 2023-07-25 ENCOUNTER — Ambulatory Visit
Admission: EM | Admit: 2023-07-25 | Discharge: 2023-07-25 | Disposition: A | Discharge status: Home / Self Care | Payer: PPO | Attending: Emergency Medicine | Admitting: Emergency Medicine

## 2023-07-25 DIAGNOSIS — N390 Urinary tract infection, site not specified: Secondary | ICD-10-CM | POA: Diagnosis not present

## 2023-07-25 DIAGNOSIS — B3731 Acute candidiasis of vulva and vagina: Secondary | ICD-10-CM | POA: Insufficient documentation

## 2023-07-25 LAB — URINALYSIS, W/ REFLEX TO CULTURE (INFECTION SUSPECTED)
Bilirubin Urine: NEGATIVE
Glucose, UA: NEGATIVE mg/dL
Ketones, ur: NEGATIVE mg/dL
Nitrite: POSITIVE — AB
Protein, ur: NEGATIVE mg/dL
Specific Gravity, Urine: 1.02 (ref 1.005–1.030)
pH: 7.5 (ref 5.0–8.0)

## 2023-07-25 MED ORDER — CEFDINIR 300 MG PO CAPS: 300.0000 mg | ORAL_CAPSULE | Freq: Two times a day (BID) | ORAL | 0 refills | Status: AC

## 2023-07-25 MED ORDER — PHENAZOPYRIDINE HCL 200 MG PO TABS: 200.0000 mg | ORAL_TABLET | Freq: Three times a day (TID) | ORAL | 0 refills | Status: DC

## 2023-07-25 MED ORDER — FLUCONAZOLE 150 MG PO TABS: 150.0000 mg | ORAL_TABLET | ORAL | 0 refills | Status: AC

## 2023-07-25 MED ORDER — ONDANSETRON HCL 4 MG PO TABS: 4.0000 mg | ORAL_TABLET | Freq: Four times a day (QID) | ORAL | 0 refills | Status: DC

## 2023-07-25 NOTE — Discharge Instructions (Addendum)
Your urinalysis shows that you still have a urinary tract infection.  I am going to prescribe you a different antibiotic for a longer duration to help treat your UTI.  Take the Cefdinir twice daily for 10 days with food for treatment of urinary tract infection.  Use the Pyridium every 8 hours as needed for urinary discomfort.  This will turn your urine a bright red-orange.  Increase your oral fluid intake so that you increase your urine production and or flushing your urinary system.  Take an over-the-counter probiotic, such as Culturelle-Align-Activia, 1 hour after each dose of antibiotic to prevent diarrhea or yeast infections from forming.  We will culture urine and change the antibiotics if necessary.  You may use the Zofran every 6 hours as needed for nausea or vomiting.  Your urinalysis also showed that you had budding yeast, most likely result of being on antibiotics recently.  We will treat you for developing yeast infection with the medication called Diflucan.  Take the Diflucan 150 mg tablets for treatment of yeast infection.  You will take 1 tablet now and repeat dosing every 3 days for total of 3 doses.  If you wish you may wait until you have finished this third round of antibiotics before you start the Diflucan.  Return for reevaluation, or see your primary care provider, for any new or worsening symptoms.

## 2023-07-25 NOTE — ED Triage Notes (Signed)
Patient c/o dysuria and urinary frequency that started yesterday.  Patient states that she just finished her antibiotics for UTI earlier this month.

## 2023-07-25 NOTE — ED Provider Notes (Signed)
[facility]    CSN: [id number] Arrival date & time: 07/25/23  0920      History   Chief Complaint Chief Complaint  Patient presents with   Urinary Frequency   Dysuria    HPI Kim Santiago is a 88 y.o. female.   HPI  88 year old female with a past medical history consistent with Sjogren's syndrome, MDD, hyperlipidemia, essential hypertension, and thyroid cancer presents for evaluation of burning with urination along with urinary frequency that started yesterday.  She was seen in this urgent care on 07/11/2023 and diagnosed with UTI and started on Macrobid 100 mg twice daily for 5 days.  Urine culture grew out Proteus Mirabella's which does not respond to nitrofurantoin but is susceptible to cephalosporins.  She was subsequently called and 5 and milligrams of Keflex twice daily for 7 days.  She reports that she finished antibiotic and had 1 day of being symptom-free before her symptoms returned.  She denies any fever, vomiting, or back pain.  She has had some mild nausea.  Past Medical History:  Diagnosis Date   Arthritis    Breast cancer (HCC) 2017   left mastectomy done 11/2015   Breast cancer in female [facility]) 11/18/2015   Left: 3.9 cm tumor, T2, 1/2 sentinel nodes positive for macro metastatic disease, N1, 3 negative nodes in the axillary tail, ER+,PR+, Her 2 neu, low Mammoprint score   Cancer (HCC)    thyroid takes levothyroxine   CAP (community acquired pneumonia) 05/05/2021   Chronic kidney disease    UTI   Genetic screening 11/2015   Mammoprint of left breast cancer: Low risk for recurrence.    History of heart attack 06/12/2014   Hypertension    hypothyroidism    secondary to thyroidectomy for thyroid ca   Hypothyroidism    Lupus    subcutaneous   Menopause 40s   natural, hot flashes and mood lability now gone, off prempro 7 months   Myocardial infarction [facility]) 2013   Osteoporosis    Osteopenia   Rosacea    Sjoegren syndrome    Stress-induced  cardiomyopathy September of 2013   EF 35%. Peak troponin was 1.8.   Stroke [facility]) 10/2018    Patient Active Problem List   Diagnosis Date Noted   Gait instability 04/21/2022   Pancytopenia (HCC) 12/27/2021   Iron deficiency anemia 12/23/2021   Frequent epistaxis 12/23/2021   Chronic diastolic heart failure (HCC) 06/19/2021   COVID-19 virus infection 04/20/2021   Generalized weakness 11/23/2020   Vaccine reaction, initial encounter 08/20/2020   Other drug-induced neutropenia (HCC) 01/16/2020   Hemiplegia affecting dominant side, post-stroke (HCC) 01/16/2020   Does use hearing aid 07/14/2019   Hypothyroidism due to acquired atrophy of thyroid 04/08/2019   Impaired balance as late effect of cerebrovascular accident 11/13/2018   History of CVA with residual deficit 11/13/2018   Leukopenia 11/11/2018   Anxiety about health 07/05/2018   Mediastinal adenopathy 04/27/2018   Edema due to hypoalbuminemia 02/20/2018   Hospital discharge follow-up 02/20/2018   History of vertebral fracture 02/18/2018   GERD (gastroesophageal reflux disease) 10/31/2017   Hand joint pain 06/13/2017   Vaginal atrophy 05/11/2017   Venous insufficiency of both lower extremities 01/10/2017   Subacute cutaneous lupus erythematosus 06/26/2016   Anemia, chronic disease 05/24/2016   Recurrent UTI 04/29/2016   TMJ syndrome 03/31/2016   Hearing loss 03/12/2016   Osteopenia 12/05/2015   Malignant neoplasm of left female breast (HCC) 11/26/2015   Hx of thyroid cancer  06/12/2014   Adenomatous polyp 06/12/2014   Major depressive disorder, recurrent episode, moderate (HCC) 05/29/2014   Grief 04/08/2014   Sjogren's syndrome (HCC) 03/09/2014   Insomnia due to stress 03/06/2014   Hyperlipidemia 12/28/2012   Medicare annual wellness visit, subsequent 12/28/2012   Essential hypertension 10/02/2011   Iatrogenic hypothyroidism 10/02/2011   Purpura (HCC) 05/25/2011    Past Surgical History:  Procedure Laterality Date    BACK SURGERY     BREAST BIOPSY Left 10/30/15   positive, done in Dr. Rutherford Nail office   CARDIAC CATHETERIZATION  05/2012   [facility]. No significant CAD. Ejection fraction of 35% due to stress-induced cardiomyopathy.   CHOLECYSTECTOMY     COLONOSCOPY     DILATION AND CURETTAGE OF UTERUS     KYPHOSIS SURGERY  Feb 2008   L1, Dr. Gerrit Heck   LUMBAR DISC SURGERY     L4-L5   MASTECTOMY Left 11/18/2015   positive   SENTINEL NODE BIOPSY Left 11/18/2015   Procedure: SENTINEL NODE BIOPSY;  Surgeon: Earline Mayotte, MD;  Location: [facility] ORS;  Service: General;  Laterality: Left;   SHOULDER ARTHROSCOPY  2004   Left, Dr. Gavin Potters   SIMPLE MASTECTOMY WITH AXILLARY SENTINEL NODE BIOPSY Left 11/18/2015   Procedure: SIMPLE MASTECTOMY;  Surgeon: Earline Mayotte, MD;  Location: [facility] ORS;  Service: General;  Laterality: Left;   SPINE SURGERY     L4-5 diskectomy   THYROIDECTOMY     Thyroid Cancer   TONSILLECTOMY     TUBAL LIGATION      OB History     Gravida  0   Para  0   Term  0   Preterm  0   AB  0   Living  0      SAB  0   IAB  0   Ectopic  0   Multiple  0   Live Births           Obstetric Comments  1st Menstrual Cycle:  16 1 adopted child            Home Medications    Prior to Admission medications   Medication Sig Start Date End Date Taking? Authorizing Provider  cefdinir (OMNICEF) 300 MG capsule Take 1 capsule (300 mg total) by mouth 2 (two) times daily for 10 days. 07/25/23 08/04/23 Yes Becky Augusta, NP  fluconazole (DIFLUCAN) 150 MG tablet Take 1 tablet (150 mg total) by mouth every 3 (three) days for 3 doses. 07/25/23 08/01/23 Yes Becky Augusta, NP  ondansetron (ZOFRAN) 4 MG tablet Take 1 tablet (4 mg total) by mouth every 6 (six) hours. 07/25/23  Yes Becky Augusta, NP  phenazopyridine (PYRIDIUM) 200 MG tablet Take 1 tablet (200 mg total) by mouth 3 (three) times daily. 07/25/23  Yes Becky Augusta, NP  atorvastatin (LIPITOR) 20 MG tablet TAKE 1 TABLET BY MOUTH  EVERY DAY AT 6 PM 01/19/23   Sherlene Shams, MD  Calcium Carbonate-Vitamin D 600-200 MG-UNIT TABS Take 2 tablets by mouth daily.    [provider]  cholecalciferol (VITAMIN D) 1000 UNITS tablet Take 2,000 Units by mouth daily.    [provider]  CRANBERRY PO Take 1 capsule by mouth 2 (two) times daily.    [provider]  fluticasone (FLONASE) 50 MCG/ACT nasal spray Place 1 spray into both nostrils daily. 06/27/23   [provider]  folic acid (FOLVITE) 1 MG tablet Take 2 mg by mouth daily. 08/13/21   [provider]  furosemide (LASIX) 20 MG tablet TAKE 1 TABLET BY MOUTH EVERY OTHER DAY AS NEEDED 01/14/23   Sherlene Shams, MD  levothyroxine (SYNTHROID) 75 MCG tablet TAKE 1 TABLET BY MOUTH ( TOTAL) DAILY 07/05/23   Sherlene Shams, MD  losartan (COZAAR) 25 MG tablet TAKE 1 TABLET BY MOUTH EVERY DAY 12/25/22   Sherlene Shams, MD  meloxicam (MOBIC) 15 MG tablet TAKE 1 TABLET (15 MG TOTAL) BY MOUTH DAILY. 06/30/23   Sherlene Shams, MD  methotrexate (RHEUMATREX) 2.5 MG tablet Take 15 mg by mouth once a week. 08/08/21   [provider]  metoprolol succinate (TOPROL-XL) 25 MG 24 hr tablet TAKE 1 TABLET BY MOUTH EVERY DAY 06/22/23   Sherlene Shams, MD  omeprazole (PRILOSEC) 20 MG capsule TAKE 1 CAPSULE (20 MG TOTAL) BY MOUTH 2 (TWO) TIMES DAILY BEFORE A MEAL. 06/24/23   Sherlene Shams, MD  polyethylene glycol (MIRALAX / GLYCOLAX) packet Take 17 g by mouth daily.    [provider]  predniSONE (DELTASONE) 5 MG tablet Take 5 mg by mouth daily with breakfast.    [provider]  Probiotic Product (PROBIOTIC PO) Take 1 tablet by mouth daily.    [provider]  sertraline (ZOLOFT) 25 MG tablet Take 1 tablet (25 mg total) by mouth daily. 01/19/23   Sherlene Shams, MD  sodium fluoride (PREVIDENT 5000 PLUS) 1.1 % CREA dental cream Take 1 application by mouth daily.  09/15/18   [provider]  tiZANidine (ZANAFLEX) 4  MG tablet Take 1 tablet (4 mg total) by mouth every 6 (six) hours as needed for muscle spasms. 06/02/23   Sherlene Shams, MD  triamcinolone cream (KENALOG) 0.1 % APPLY TO AFFECTED AREA TWICE A DAY 03/21/21   [provider]    Family History Family History  Problem Relation Age of Onset   Kidney disease Mother    Heart disease Mother    COPD Father    Cancer Father        esophageal   Kidney disease Sister    Cancer Brother 91       colon cancer (both brothers)   Cancer Brother    Heart attack Brother 82   Heart disease Brother    Breast cancer Neg Hx     Social History Social History   Tobacco Use   Smoking status: Never   Smokeless tobacco: Never  Vaping Use   Vaping status: Never Used  Substance Use Topics   Alcohol use: No   Drug use: No     Allergies   Naprosyn [naproxen], Amoxicillin, Azathioprine, Codeine, Hydroxychloroquine, Mycophenolate mofetil, Orudis [ketoprofen], Sulfa antibiotics, and Sulfathiazole   Review of Systems Review of Systems  Constitutional:  Negative for fever.  Gastrointestinal:  Positive for nausea. Negative for vomiting.  Musculoskeletal:  Negative for back pain.     Physical Exam Triage Vital Signs ED Triage Vitals  Encounter Vitals Group     BP      Systolic BP Percentile      Diastolic BP Percentile      Pulse      Resp      Temp      Temp src      SpO2      Weight      Height      Head Circumference      Peak Flow      Pain Score      Pain Loc  Pain Education      Exclude from Growth Chart    No data found.  Updated Vital Signs BP 132/66 (BP Location: Left Arm)   Pulse 77   Temp 98.1 F (36.7 C) (Oral)   Resp 14   Ht 5\' 4"  (1.626 m)   Wt 138 lb 0.1 oz (62.6 kg)   SpO2 97%   BMI 23.69 kg/m   Visual Acuity Right Eye Distance:   Left Eye Distance:   Bilateral Distance:    Right Eye Near:   Left Eye Near:    Bilateral Near:     Physical Exam Vitals and nursing note reviewed.   Constitutional:      Appearance: Normal appearance. She is not ill-appearing.  HENT:     Head: Normocephalic and atraumatic.  Cardiovascular:     Rate and Rhythm: Normal rate and regular rhythm.     Pulses: Normal pulses.     Heart sounds: Normal heart sounds. No murmur heard.    No friction rub. No gallop.  Pulmonary:     Effort: Pulmonary effort is normal.     Breath sounds: Normal breath sounds. No wheezing, rhonchi or rales.  Abdominal:     Tenderness: There is no right CVA tenderness or left CVA tenderness.  Skin:    General: Skin is warm and dry.     Capillary Refill: Capillary refill takes less than 2 seconds.  Neurological:     General: No focal deficit present.     Mental Status: She is alert and oriented to person, place, and time.      UC Treatments / Results  Labs (all labs ordered are listed, but only abnormal results are displayed) Labs Reviewed  URINALYSIS, W/ REFLEX TO CULTURE (INFECTION SUSPECTED) - Abnormal; Notable for the following components:      Result Value   APPearance HAZY (*)    Hgb urine dipstick TRACE (*)    Nitrite POSITIVE (*)    Leukocytes,Ua MODERATE (*)    Bacteria, UA FEW (*)    All other components within normal limits  URINE CULTURE    EKG   Radiology No results found.  Procedures Procedures (including critical care time)  Medications Ordered in UC Medications - No data to display  Initial Impression / Assessment and Plan / UC Course  I have reviewed the triage vital signs and the nursing notes.  Pertinent labs & imaging results that were available during my care of the patient were reviewed by me and considered in my medical decision making (see chart for details).   Patient is a pleasant, nontoxic-appearing 28 old female presenting for evaluation of 1 day with a UTI symptoms as outlined HPI above.  On exam she is in no acute distress and she has no CVA tenderness on exam.  She was treated with Macrobid followed by Keflex  which did help her symptoms but they returned after 1 day being off the antibiotics.  I suspect that she had an incomplete treatment of her Proteus Mirabillis UTI.  I will order urinalysis to evaluate for the presence of UTI.  Urinalysis shows hazy appearance with trace hemoglobin and nitrite positive with moderate leukocyte esterase.  Reflex microscopy shows 21-50 WBCs, 6-10 RBCs, few bacteria, WBC clumps, and budding yeast.  Urine will reflex to culture.  I will discharge patient home with a diagnosis of urinary tract infection.  Given that she completed a course of Keflex without resolution of her UTI and she is allergic to  sulfa and amoxicillin, I will discharge her home on cefdinir 300 mg twice daily for 10 days along with Pyridium to help with urinary discomfort.  I will refill her Zofran to up with nausea and I will also prescribe Diflucan given that she has budding yeast in her urine.   Final Clinical Impressions(s) / UC Diagnoses   Final diagnoses:  Lower urinary tract infectious disease  Vaginal yeast infection     Discharge Instructions      Your urinalysis shows that you still have a urinary tract infection.  I am going to prescribe you a different antibiotic for a longer duration to help treat your UTI.  Take the Cefdinir twice daily for 10 days with food for treatment of urinary tract infection.  Use the Pyridium every 8 hours as needed for urinary discomfort.  This will turn your urine a bright red-orange.  Increase your oral fluid intake so that you increase your urine production and or flushing your urinary system.  Take an over-the-counter probiotic, such as Culturelle-Align-Activia, 1 hour after each dose of antibiotic to prevent diarrhea or yeast infections from forming.  We will culture urine and change the antibiotics if necessary.  You may use the Zofran every 6 hours as needed for nausea or vomiting.  Your urinalysis also showed that you had budding yeast, most  likely result of being on antibiotics recently.  We will treat you for developing yeast infection with the medication called Diflucan.  Take the Diflucan 150 mg tablets for treatment of yeast infection.  You will take 1 tablet now and repeat dosing every 3 days for total of 3 doses.  If you wish you may wait until you have finished this third round of antibiotics before you start the Diflucan.  Return for reevaluation, or see your primary care provider, for any new or worsening symptoms.      ED Prescriptions     Medication Sig Dispense Auth. Provider   cefdinir (OMNICEF) 300 MG capsule Take 1 capsule (300 mg total) by mouth 2 (two) times daily for 10 days. 20 capsule Becky Augusta, NP   phenazopyridine (PYRIDIUM) 200 MG tablet Take 1 tablet (200 mg total) by mouth 3 (three) times daily. 6 tablet Becky Augusta, NP   ondansetron (ZOFRAN) 4 MG tablet Take 1 tablet (4 mg total) by mouth every 6 (six) hours. 12 tablet Becky Augusta, NP   fluconazole (DIFLUCAN) 150 MG tablet Take 1 tablet (150 mg total) by mouth every 3 (three) days for 3 doses. 3 tablet Becky Augusta, NP      PDMP not reviewed this encounter.   Becky Augusta, NP 07/25/23 1032

## 2023-07-27 LAB — URINE CULTURE: Culture: >=100000 — AB

## 2023-08-04 ENCOUNTER — Ambulatory Visit: Payer: PPO | Admitting: Oncology

## 2023-08-04 ENCOUNTER — Other Ambulatory Visit: Payer: PPO

## 2023-08-04 DIAGNOSIS — Z961 Presence of intraocular lens: Secondary | ICD-10-CM | POA: Diagnosis not present

## 2023-08-04 DIAGNOSIS — M3500 Sicca syndrome, unspecified: Secondary | ICD-10-CM | POA: Diagnosis not present

## 2023-08-04 DIAGNOSIS — H5203 Hypermetropia, bilateral: Secondary | ICD-10-CM | POA: Diagnosis not present

## 2023-08-04 DIAGNOSIS — H524 Presbyopia: Secondary | ICD-10-CM | POA: Diagnosis not present

## 2023-08-04 DIAGNOSIS — H353131 Nonexudative age-related macular degeneration, bilateral, early dry stage: Secondary | ICD-10-CM | POA: Diagnosis not present

## 2023-08-04 DIAGNOSIS — H52223 Regular astigmatism, bilateral: Secondary | ICD-10-CM | POA: Diagnosis not present

## 2023-08-04 DIAGNOSIS — H04123 Dry eye syndrome of bilateral lacrimal glands: Secondary | ICD-10-CM | POA: Diagnosis not present

## 2023-08-06 ENCOUNTER — Other Ambulatory Visit: Payer: PPO

## 2023-08-06 ENCOUNTER — Ambulatory Visit: Payer: PPO | Admitting: Oncology

## 2023-08-19 DIAGNOSIS — L720 Epidermal cyst: Secondary | ICD-10-CM | POA: Diagnosis not present

## 2023-08-19 DIAGNOSIS — L719 Rosacea, unspecified: Secondary | ICD-10-CM | POA: Diagnosis not present

## 2023-08-19 DIAGNOSIS — D692 Other nonthrombocytopenic purpura: Secondary | ICD-10-CM | POA: Diagnosis not present

## 2023-08-19 DIAGNOSIS — Z79899 Other long term (current) drug therapy: Secondary | ICD-10-CM | POA: Diagnosis not present

## 2023-08-19 DIAGNOSIS — L931 Subacute cutaneous lupus erythematosus: Secondary | ICD-10-CM | POA: Diagnosis not present

## 2023-08-20 ENCOUNTER — Other Ambulatory Visit: Payer: Self-pay | Admitting: Internal Medicine

## 2023-08-24 DIAGNOSIS — F0280 Dementia in other diseases classified elsewhere without behavioral disturbance: Secondary | ICD-10-CM | POA: Diagnosis not present

## 2023-08-24 DIAGNOSIS — I10 Essential (primary) hypertension: Secondary | ICD-10-CM | POA: Diagnosis not present

## 2023-08-24 DIAGNOSIS — Z9012 Acquired absence of left breast and nipple: Secondary | ICD-10-CM | POA: Diagnosis not present

## 2023-08-24 DIAGNOSIS — F3340 Major depressive disorder, recurrent, in remission, unspecified: Secondary | ICD-10-CM | POA: Diagnosis not present

## 2023-08-24 DIAGNOSIS — Z515 Encounter for palliative care: Secondary | ICD-10-CM | POA: Diagnosis not present

## 2023-08-24 DIAGNOSIS — Z66 Do not resuscitate: Secondary | ICD-10-CM | POA: Diagnosis not present

## 2023-08-24 DIAGNOSIS — M3214 Glomerular disease in systemic lupus erythematosus: Secondary | ICD-10-CM | POA: Diagnosis not present

## 2023-08-24 DIAGNOSIS — M549 Dorsalgia, unspecified: Secondary | ICD-10-CM | POA: Diagnosis not present

## 2023-08-24 DIAGNOSIS — Z6823 Body mass index (BMI) 23.0-23.9, adult: Secondary | ICD-10-CM | POA: Diagnosis not present

## 2023-08-24 DIAGNOSIS — I69354 Hemiplegia and hemiparesis following cerebral infarction affecting left non-dominant side: Secondary | ICD-10-CM | POA: Diagnosis not present

## 2023-08-24 DIAGNOSIS — D692 Other nonthrombocytopenic purpura: Secondary | ICD-10-CM | POA: Diagnosis not present

## 2023-08-24 DIAGNOSIS — Z853 Personal history of malignant neoplasm of breast: Secondary | ICD-10-CM | POA: Diagnosis not present

## 2023-08-25 DIAGNOSIS — M62830 Muscle spasm of back: Secondary | ICD-10-CM | POA: Diagnosis not present

## 2023-08-25 DIAGNOSIS — M546 Pain in thoracic spine: Secondary | ICD-10-CM | POA: Diagnosis not present

## 2023-08-27 ENCOUNTER — Telehealth: Payer: Self-pay | Admitting: Urology

## 2023-08-27 ENCOUNTER — Ambulatory Visit
Admission: RE | Admit: 2023-08-27 | Discharge: 2023-08-27 | Disposition: A | Discharge status: Home / Self Care | Payer: PPO | Source: Ambulatory Visit | Attending: Urology | Admitting: Urology

## 2023-08-27 ENCOUNTER — Other Ambulatory Visit: Payer: Self-pay

## 2023-08-27 ENCOUNTER — Ambulatory Visit: Payer: PPO | Admitting: Urology

## 2023-08-27 ENCOUNTER — Other Ambulatory Visit
Admission: RE | Admit: 2023-08-27 | Discharge: 2023-08-27 | Disposition: A | Discharge status: Home / Self Care | Payer: PPO | Source: Home / Self Care | Attending: Urology | Admitting: Urology

## 2023-08-27 ENCOUNTER — Ambulatory Visit
Admission: RE | Admit: 2023-08-27 | Discharge: 2023-08-27 | Disposition: A | Discharge status: Home / Self Care | Payer: PPO | Attending: Urology | Admitting: Urology

## 2023-08-27 VITALS — BP 121/58 | HR 83 | Ht 63.0 in | Wt 138.0 lb

## 2023-08-27 DIAGNOSIS — R109 Unspecified abdominal pain: Secondary | ICD-10-CM | POA: Insufficient documentation

## 2023-08-27 DIAGNOSIS — N390 Urinary tract infection, site not specified: Secondary | ICD-10-CM | POA: Insufficient documentation

## 2023-08-27 DIAGNOSIS — Z87440 Personal history of urinary (tract) infections: Secondary | ICD-10-CM

## 2023-08-27 LAB — URINALYSIS, COMPLETE (UACMP) WITH MICROSCOPIC
Bilirubin Urine: NEGATIVE
Glucose, UA: NEGATIVE mg/dL
Hgb urine dipstick: NEGATIVE
Nitrite: NEGATIVE
Protein, ur: 100 mg/dL — AB
Specific Gravity, Urine: 1.015 (ref 1.005–1.030)
Urine-Other: <10
pH: 6 (ref 5.0–8.0)

## 2023-08-27 LAB — BLADDER SCAN AMB NON-IMAGING

## 2023-08-27 MED ORDER — PREMARIN 0.625 MG/GM VA CREA: 1.0000 | TOPICAL_CREAM | Freq: Every day | VAGINAL | 12 refills | Status: DC

## 2023-08-27 NOTE — Patient Instructions (Signed)
For UTI prevention take cranberry tablets, probiotic, D-Mannose daily.  

## 2023-08-27 NOTE — Telephone Encounter (Signed)
Message sent via MyChart.

## 2023-08-27 NOTE — Progress Notes (Signed)
Kim Santiago,acting as a scribe for Vanna Scotland, MD.,have documented all relevant documentation on the behalf of Vanna Scotland, MD,as directed by  Vanna Scotland, MD while in the presence of Vanna Scotland, MD.  08/27/23 10:57 AM   Kim Santiago [birth year] [id number]  Referring provider: Sherlene Shams, MD [address] [place],  [place] [address]  Chief Complaint  Patient presents with   Recurrent UTI    HPI: 88 year-old female who presents today for further evaluation of recurrent urinary tract infections.   She was seen on 07/11/2023 and 07/25/2023 at urgent care for burning with urination and urinary frequency. At the time, her urinalysis was suspicious and she was treated with Macrobid for 5 days. Urine culture grew proteus that was resistant to nitrofurantoin. Once this was identified, her prescription was changed to Keflex 500 mg BID for 7 days. She was seen back in the urgent care on the 17th after her symptoms recurred with only a short interval. Between these, her urine again was suspicious. She grew proteus again. This time, she was discharged on Cefdinir for 10 days. She does not have any upper tract imaging.    She does have a personal history of Sjogren's disease and breast cancer, ER/PR positive, diagnosed in 2017.   Prior to these 2 episodes, she has not had a urinary tract infection in 2 years. She reports urinary symptoms today, including difficulty urinating and leakage. She has been taking cranberry supplements daily, as previously advised, which had prevented infections for three years until the recent episodes  She reports severe back pain, described as one of the worst experienced, with pain radiating around the back. She describes the pain is positional, worsening with certain movements, and is not typical of her previous back pain experiences.  Results for orders placed or performed in visit on 08/27/23  Bladder Scan (Post Void Residual)  in office  Result Value Ref Range   Scan Result 30ml   Results for orders placed or performed during the hospital encounter of 08/27/23  Urinalysis, Complete w Microscopic -  Result Value Ref Range   Color, Urine YELLOW YELLOW   APPearance HAZY (A) CLEAR   Specific Gravity, Urine 1.015 1.005 - 1.030   pH 6.0 5.0 - 8.0   Glucose, UA NEGATIVE NEGATIVE mg/dL   Hgb urine dipstick NEGATIVE NEGATIVE   Bilirubin Urine NEGATIVE NEGATIVE   Ketones, ur TRACE (A) NEGATIVE mg/dL   Protein, ur 621 (A) NEGATIVE mg/dL   Nitrite NEGATIVE NEGATIVE   Leukocytes,Ua TRACE (A) NEGATIVE   Squamous Epithelial / HPF 0-5 0 - 5 /HPF   WBC, UA 11-20 0 - 5 WBC/hpf   RBC / HPF 0-5 0 - 5 RBC/hpf   Bacteria, UA FEW (A) NONE SEEN   Urine-Other <10 CFU/ML WITHIN ACCEPTABLE LIMITS     PMH: Past Medical History:  Diagnosis Date   Arthritis    Breast cancer (HCC) 2017   left mastectomy done 11/2015   Breast cancer in female [facility]) 11/18/2015   Left: 3.9 cm tumor, T2, 1/2 sentinel nodes positive for macro metastatic disease, N1, 3 negative nodes in the axillary tail, ER+,PR+, Her 2 neu, low Mammoprint score   Cancer (HCC)    thyroid takes levothyroxine   CAP (community acquired pneumonia) 05/05/2021   Chronic kidney disease    UTI   Genetic screening 11/2015   Mammoprint of left breast cancer: Low risk for recurrence.    History of heart attack  06/12/2014   Hypertension    hypothyroidism    secondary to thyroidectomy for thyroid ca   Hypothyroidism    Lupus    subcutaneous   Menopause 40s   natural, hot flashes and mood lability now gone, off prempro 7 months   Myocardial infarction [facility]) 2013   Osteoporosis    Osteopenia   Rosacea    Sjoegren syndrome    Stress-induced cardiomyopathy September of 2013   EF 35%. Peak troponin was 1.8.   Stroke [facility]) 10/2018    Surgical History: Past Surgical History:  Procedure Laterality Date   BACK SURGERY     BREAST BIOPSY Left 10/30/15   positive, done in  Dr. Rutherford Nail office   CARDIAC CATHETERIZATION  05/2012   [facility]. No significant CAD. Ejection fraction of 35% due to stress-induced cardiomyopathy.   CHOLECYSTECTOMY     COLONOSCOPY     DILATION AND CURETTAGE OF UTERUS     KYPHOSIS SURGERY  Feb 2008   L1, Dr. Gerrit Heck   LUMBAR DISC SURGERY     L4-L5   MASTECTOMY Left 11/18/2015   positive   SENTINEL NODE BIOPSY Left 11/18/2015   Procedure: SENTINEL NODE BIOPSY;  Surgeon: Earline Mayotte, MD;  Location: [facility] ORS;  Service: General;  Laterality: Left;   SHOULDER ARTHROSCOPY  2004   Left, Dr. Gavin Potters   SIMPLE MASTECTOMY WITH AXILLARY SENTINEL NODE BIOPSY Left 11/18/2015   Procedure: SIMPLE MASTECTOMY;  Surgeon: Earline Mayotte, MD;  Location: [facility] ORS;  Service: General;  Laterality: Left;   SPINE SURGERY     L4-5 diskectomy   THYROIDECTOMY     Thyroid Cancer   TONSILLECTOMY     TUBAL LIGATION      Home Medications:  Allergies as of 08/27/2023       Reactions   Naprosyn [naproxen] Swelling   Amoxicillin Rash   Tolerates first and third generation cephalosporins   Azathioprine Nausea And Vomiting   Severe vomiting   Codeine Nausea And Vomiting   Hydroxychloroquine Hives, Nausea And Vomiting   Mycophenolate Mofetil Nausea Only   Orudis [ketoprofen] Hives   Sulfa Antibiotics Rash   Sulfathiazole Rash        Medication List        Accurate as of August 27, 2023 10:57 AM. If you have any questions, ask your nurse or doctor.          STOP taking these medications    phenazopyridine 200 MG tablet Commonly known as: PYRIDIUM Stopped by: Vanna Scotland       TAKE these medications    atorvastatin 20 MG tablet Commonly known as: LIPITOR TAKE 1 TABLET BY MOUTH EVERY DAY AT 6 PM   Calcium Carbonate-Vitamin D 600-200 MG-UNIT Tabs Take 2 tablets by mouth daily.   cholecalciferol 1000 units tablet Commonly known as: VITAMIN D Take 2,000 Units by mouth daily.   CRANBERRY PO Take 1 capsule by mouth 2 (two)  times daily.   fluticasone 50 MCG/ACT nasal spray Commonly known as: FLONASE Place 1 spray into both nostrils daily.   folic acid 1 MG tablet Commonly known as: FOLVITE Take 2 mg by mouth daily.   furosemide 20 MG tablet Commonly known as: LASIX TAKE 1 TABLET BY MOUTH EVERY OTHER DAY AS NEEDED   levothyroxine 75 MCG tablet Commonly known as: SYNTHROID TAKE 1 TABLET BY MOUTH ( TOTAL) DAILY   losartan 25 MG tablet Commonly known as: COZAAR TAKE 1 TABLET BY MOUTH EVERY DAY   meloxicam 15  MG tablet Commonly known as: MOBIC TAKE 1 TABLET (15 MG TOTAL) BY MOUTH DAILY.   methotrexate 2.5 MG tablet Commonly known as: RHEUMATREX Take 15 mg by mouth once a week.   metoprolol succinate 25 MG 24 hr tablet Commonly known as: TOPROL-XL TAKE 1 TABLET BY MOUTH EVERY DAY   metroNIDAZOLE 0.75 % gel Commonly known as: METROGEL Apply topically 2 (two) times daily.   omeprazole 20 MG capsule Commonly known as: PRILOSEC TAKE 1 CAPSULE (20 MG TOTAL) BY MOUTH 2 (TWO) TIMES DAILY BEFORE A MEAL.   ondansetron 4 MG tablet Commonly known as: ZOFRAN Take 1 tablet (4 mg total) by mouth every 6 (six) hours.   polyethylene glycol 17 g packet Commonly known as: MIRALAX / GLYCOLAX Take 17 g by mouth daily.   predniSONE 2.5 MG tablet Commonly known as: DELTASONE Take 2.5 mg by mouth daily. What changed: Another medication with the same name was removed. Continue taking this medication, and follow the directions you see here. Changed by: Vanna Scotland   PROBIOTIC PO Take 1 tablet by mouth daily.   sertraline 25 MG tablet Commonly known as: ZOLOFT Take 1 tablet (25 mg total) by mouth daily.   Sodium Fluoride 5000 PPM 1.1 % Pste Generic drug: Sodium Fluoride See admin instructions. What changed: Another medication with the same name was removed. Continue taking this medication, and follow the directions you see here. Changed by: Vanna Scotland   tiZANidine 4 MG tablet Commonly  known as: Zanaflex Take 1 tablet (4 mg total) by mouth every 6 (six) hours as needed for muscle spasms.   triamcinolone cream 0.1 % Commonly known as: KENALOG APPLY TO AFFECTED AREA TWICE A DAY        Allergies:  Allergies  Allergen Reactions   Naprosyn [Naproxen] Swelling   Amoxicillin Rash    Tolerates first and third generation cephalosporins   Azathioprine Nausea And Vomiting    Severe vomiting   Codeine Nausea And Vomiting   Hydroxychloroquine Hives and Nausea And Vomiting   Mycophenolate Mofetil Nausea Only   Orudis [Ketoprofen] Hives   Sulfa Antibiotics Rash   Sulfathiazole Rash    Family History: Family History  Problem Relation Age of Onset   Kidney disease Mother    Heart disease Mother    COPD Father    Cancer Father        esophageal   Kidney disease Sister    Cancer Brother 13       colon cancer (both brothers)   Cancer Brother    Heart attack Brother 40   Heart disease Brother    Breast cancer Neg Hx     Social History:  reports that she has never smoked. She has never used smokeless tobacco. She reports that she does not drink alcohol and does not use drugs.   Physical Exam: BP (!) 121/58 (BP Location: Right Arm, Patient Position: Sitting, Cuff Size: Normal)   Pulse 83   Ht 5\' 3"  (1.6 m)   Wt 138 lb (62.6 kg)   BMI 24.45 kg/m   Constitutional:  Alert and oriented, No acute distress. HEENT: [place] AT, moist mucus membranes.  Trachea midline, no masses. Neurologic: Grossly intact, no focal deficits, moving all 4 extremities. Psychiatric: Normal mood and affect.   Assessment & Plan:    1. Recurrent UTI - Urine culture to be repeated to confirm resolution of infection. - Consideration of kidney stones due to recurrent Proteus infections; order KUB X-ray today. If inconclusive, consider  CT scan. - Increase cranberry supplement to twice daily, add D-mannose and a probiotic for UTI prevention. - Consult with Dr. Smith Robert regarding the use of topical  estrogen cream for UTI prevention, given her history of ER/PR-positive breast cancer.  Recurrent UTI Prevention Strategies  Stay well hydrated. Get a moderate amount of exercise. Eat a diet rich in fruit and vegetables. Start a bowel regimen to manage your constipation. Your goal is to have consistent, formed bowel movements that are easy for you to pass. You may use either of the over-the-counter supplements Benefiber or Miralax to help with this. I recommend that you try Benefiber first and move on to Miralax if this is not helping you enough. You may adjust the recommended dose of Miralax (one capful daily) to achieve this goal. Start taking an over-the-counter cranberry supplement for urinary tract health. Take this once or twice daily on an empty stomach, e.g. right before bed. Start taking an over-the-counter d-mannose supplement. Take this daily per packaging instructions. Start taking an over-the-counter probiotic containing the bacterial species called Lactobacillus. Take this daily. Start vaginal estrogen cream. Apply a pea-sized amount around the opening of the urethra every day for 2 weeks, then three times weekly forever pending approval by Dr. Smith Robert  2. Right back pain - Based on the nature, it seems more musculoskeletal but given that she has had back to back proteus, it is important to rule out stones - KUB today and if that is inconclusive, consider CT  - We will contact Dr. Smith Robert regarding comfort level with topical estrogen in light of her recent infections.   Return in about 2 weeks (around 09/10/2023) for to review imaging results and urine culture.  I have reviewed the above documentation for accuracy and completeness, and I agree with the above.   Vanna Scotland, MD    [facility] [address] [place], [place] [address] [phone]

## 2023-08-28 LAB — URINE CULTURE: Culture: NO GROWTH

## 2023-09-10 ENCOUNTER — Encounter: Payer: Self-pay | Admitting: Internal Medicine

## 2023-09-10 ENCOUNTER — Telehealth (INDEPENDENT_AMBULATORY_CARE_PROVIDER_SITE_OTHER): Payer: PPO | Admitting: Internal Medicine

## 2023-09-10 VITALS — Ht 63.0 in | Wt 138.0 lb

## 2023-09-10 DIAGNOSIS — M546 Pain in thoracic spine: Secondary | ICD-10-CM

## 2023-09-10 DIAGNOSIS — K2900 Acute gastritis without bleeding: Secondary | ICD-10-CM | POA: Diagnosis not present

## 2023-09-10 MED ORDER — SERTRALINE HCL 25 MG PO TABS: 25.0000 mg | ORAL_TABLET | Freq: Every day | ORAL | 1 refills | Status: DC

## 2023-09-10 MED ORDER — LOSARTAN POTASSIUM 25 MG PO TABS: 25.0000 mg | ORAL_TABLET | Freq: Every day | ORAL | 1 refills | Status: DC

## 2023-09-10 MED ORDER — TRAMADOL HCL 50 MG PO TABS: 50.0000 mg | ORAL_TABLET | Freq: Four times a day (QID) | ORAL | 0 refills | Status: DC | PRN

## 2023-09-10 MED ORDER — ONDANSETRON HCL 4 MG PO TABS: 4.0000 mg | ORAL_TABLET | Freq: Four times a day (QID) | ORAL | 0 refills | Status: DC

## 2023-09-10 NOTE — Progress Notes (Signed)
 Virtual Visit via Caregility   Note   This format is felt to be most appropriate for this patient at this time.  All issues noted in this document were discussed and addressed.  No physical exam was performed (except for noted visual exam findings with Video Visits).   I connected with Kim Santiago on 09/10/23 at  5:00 PM EST by a video enabled telemedicine application  and verified that I am speaking with the correct person using two identifiers. Location patient: home Location provider: work or home office Persons participating in the virtual visit: patient, provider  I discussed the limitations, risks, security and privacy concerns of performing an evaluation and management service by telephone and the availability of in person appointments. I also discussed with the patient that there may be a patient responsible charge related to this service. The patient expressed understanding and agreed to proceed.  Reason for visit: recurrent nausea   HPI:  Kim Santiago is an 89 yr old female with a history of hypertension, , Sjogren syndrome , subcutaneous lupus  who presents with right sided thoracic back pain and persistent back pain and persistent nausea and vomiting of several weeks duration . She has been taking meloxicam  daily for several months but has stopped taking omeprazole  .  Her back pain started after bending over several weeks ago  after several days of persistent pain she went to [facility] and plain films of the spine  were done to rule out vertebral fractures. She has had difficulty sleeping due to the pain. Current regimen is meloxicam  prn tylenol  and prn tramadol .   ROS: See pertinent positives and negatives per HPI.  Past Medical History:  Diagnosis Date   Arthritis    Breast cancer (HCC) 2017   left mastectomy done 11/2015   Breast cancer in female [facility]) 11/18/2015   Left: 3.9 cm tumor, T2, 1/2 sentinel nodes positive for macro metastatic disease, N1, 3 negative nodes in the axillary tail,  ER+,PR+, Her 2 neu, low Mammoprint score   Cancer (HCC)    thyroid  takes levothyroxine    CAP (community acquired pneumonia) 05/05/2021   Chronic kidney disease    UTI   Genetic screening 11/2015   Mammoprint of left breast cancer: Low risk for recurrence.    History of heart attack 06/12/2014   Hypertension    hypothyroidism    secondary to thyroidectomy for thyroid  ca   Hypothyroidism    Lupus    subcutaneous   Menopause 40s   natural, hot flashes and mood lability now gone, off prempro 7 months   Myocardial infarction [facility]) 2013   Osteoporosis    Osteopenia   Rosacea    Sjoegren syndrome    Stress-induced cardiomyopathy September of 2013   EF 35%. Peak troponin was 1.8.   Stroke [facility]) 10/2018    Past Surgical History:  Procedure Laterality Date   BACK SURGERY     BREAST BIOPSY Left 10/30/15   positive, done in Dr. Fredirick office   CARDIAC CATHETERIZATION  05/2012   [facility]. No significant CAD. Ejection fraction of 35% due to stress-induced cardiomyopathy.   CHOLECYSTECTOMY     COLONOSCOPY     DILATION AND CURETTAGE OF UTERUS     KYPHOSIS SURGERY  Feb 2008   L1, Dr. Kathi   LUMBAR DISC SURGERY     L4-L5   MASTECTOMY Left 11/18/2015   positive   SENTINEL NODE BIOPSY Left 11/18/2015   Procedure: SENTINEL NODE BIOPSY;  Surgeon: Reyes LELON Cota, MD;  Location: [facility] ORS;  Service: General;  Laterality: Left;   SHOULDER ARTHROSCOPY  2004   Left, Dr. Maryl   SIMPLE MASTECTOMY WITH AXILLARY SENTINEL NODE BIOPSY Left 11/18/2015   Procedure: SIMPLE MASTECTOMY;  Surgeon: Reyes LELON Cota, MD;  Location: [facility] ORS;  Service: General;  Laterality: Left;   SPINE SURGERY     L4-5 diskectomy   THYROIDECTOMY     Thyroid  Cancer   TONSILLECTOMY     TUBAL LIGATION      Family History  Problem Relation Age of Onset   Kidney disease Mother    Heart disease Mother    COPD Father    Cancer Father        esophageal   Kidney disease Sister    Cancer Brother 63       colon  cancer (both brothers)   Cancer Brother    Heart attack Brother 40   Heart disease Brother    Breast cancer Neg Hx     SOCIAL HX:  reports that she has never smoked. She has never used smokeless tobacco. She reports that she does not drink alcohol and does not use drugs.   Current Outpatient Medications:    atorvastatin  (LIPITOR) 20 MG tablet, TAKE 1 TABLET BY MOUTH EVERY DAY AT 6 PM, Disp: 90 tablet, Rfl: 3   Calcium  Carbonate-Vitamin D  600-200 MG-UNIT TABS, Take 2 tablets by mouth daily., Disp: , Rfl:    cholecalciferol  (VITAMIN D ) 1000 UNITS tablet, Take 2,000 Units by mouth daily., Disp: , Rfl:    CRANBERRY PO, Take 1 capsule by mouth 2 (two) times daily., Disp: , Rfl:    folic acid  (FOLVITE ) 1 MG tablet, Take 2 mg by mouth daily., Disp: , Rfl:    furosemide  (LASIX ) 20 MG tablet, TAKE 1 TABLET BY MOUTH EVERY OTHER DAY AS NEEDED, Disp: 45 tablet, Rfl: 2   levothyroxine  (SYNTHROID ) 75 MCG tablet, TAKE 1 TABLET BY MOUTH (75MCG TOTAL) DAILY, Disp: 90 tablet, Rfl: 2   meloxicam  (MOBIC ) 15 MG tablet, TAKE 1 TABLET (15 MG TOTAL) BY MOUTH DAILY., Disp: 30 tablet, Rfl: 0   methotrexate  (RHEUMATREX) 2.5 MG tablet, Take 15 mg by mouth once a week., Disp: , Rfl:    metoprolol  succinate (TOPROL -XL) 25 MG 24 hr tablet, TAKE 1 TABLET BY MOUTH EVERY DAY, Disp: 90 tablet, Rfl: 3   metroNIDAZOLE (METROGEL) 0.75 % gel, Apply topically 2 (two) times daily., Disp: , Rfl:    omeprazole  (PRILOSEC) 20 MG capsule, TAKE 1 CAPSULE (20 MG TOTAL) BY MOUTH 2 (TWO) TIMES DAILY BEFORE A MEAL., Disp: 180 capsule, Rfl: 1   polyethylene glycol (MIRALAX  / GLYCOLAX ) packet, Take 17 g by mouth daily., Disp: , Rfl:    predniSONE  (DELTASONE ) 2.5 MG tablet, Take 2.5 mg by mouth daily., Disp: , Rfl:    Probiotic Product (PROBIOTIC PO), Take 1 tablet by mouth daily., Disp: , Rfl:    SODIUM FLUORIDE 5000 PPM 1.1 % PSTE, See admin instructions., Disp: , Rfl:    tiZANidine  (ZANAFLEX ) 4 MG tablet, Take 1 tablet (4 mg total) by mouth  every 6 (six) hours as needed for muscle spasms., Disp: 30 tablet, Rfl: 0   traMADol  (ULTRAM ) 50 MG tablet, Take 1 tablet (50 mg total) by mouth every 6 (six) hours as needed for up to 7 days., Disp: 28 tablet, Rfl: 0   triamcinolone  cream (KENALOG ) 0.1 %, APPLY TO AFFECTED AREA TWICE A DAY, Disp: , Rfl:    fluticasone (FLONASE) 50 MCG/ACT nasal spray,  Place 1 spray into both nostrils daily. (Patient not taking: Reported on 09/10/2023), Disp: , Rfl:    losartan  (COZAAR ) 25 MG tablet, Take 1 tablet (25 mg total) by mouth daily., Disp: 90 tablet, Rfl: 1   ondansetron  (ZOFRAN ) 4 MG tablet, Take 1 tablet (4 mg total) by mouth every 6 (six) hours., Disp: 20 tablet, Rfl: 0   sertraline  (ZOLOFT ) 25 MG tablet, Take 1 tablet (25 mg total) by mouth daily., Disp: 90 tablet, Rfl: 1  EXAM:  VITALS per patient if applicable:  GENERAL: alert, oriented, appears well and in no acute distress  HEENT: atraumatic, conjunttiva clear, no obvious abnormalities on inspection of external nose and ears  NECK: normal movements of the head and neck  LUNGS: on inspection no signs of respiratory distress, breathing rate appears normal, no obvious gross SOB, gasping or wheezing  CV: no obvious cyanosis  MS: moves all visible extremities without noticeable abnormality  PSYCH/NEURO: pleasant and cooperative, no obvious depression or anxiety, speech and thought processing grossly intact  ASSESSMENT AND PLAN: Other acute gastritis, presence of bleeding unspecified Assessment & Plan: Suspected as the cause for her nausea , given recent urology evaluation with UTI ruled out.  Advised to dc meloxicam  and rstart omeprazole  40 mg daily    Acute right-sided thoracic back pain Assessment & Plan: Vertebral fractures were apparently ruled out by [facility].  Outlined treatment plan for pain management using tylenol , tramadol ,  Salon Pas patch and heating pad    Other orders -     Losartan  Potassium; Take 1 tablet (25 mg  total) by mouth daily.  Dispense: 90 tablet; Refill: 1 -     Sertraline  HCl; Take 1 tablet (25 mg total) by mouth daily.  Dispense: 90 tablet; Refill: 1 -     traMADol  HCl; Take 1 tablet (50 mg total) by mouth every 6 (six) hours as needed for up to 7 days.  Dispense: 28 tablet; Refill: 0 -     Ondansetron  HCl; Take 1 tablet (4 mg total) by mouth every 6 (six) hours.  Dispense: 20 tablet; Refill: 0      I discussed the assessment and treatment plan with the patient. The patient was provided an opportunity to ask questions and all were answered. The patient agreed with the plan and demonstrated an understanding of the instructions.   The patient was advised to call back or seek an in-person evaluation if the symptoms worsen or if the condition fails to improve as anticipated.   I spent 30 minutes dedicated to the care of this patient on the date of this encounter to include pre-visit review of he medical history,  recent visit with urology and orthopedics,  Face-to-face time with the patient , and post visit ordering of testing and therapeutics.    Verneita LITTIE Kettering, MD

## 2023-09-10 NOTE — Patient Instructions (Signed)
 Stop the meloxicam. It may be causing gastritis (which can cause nausea)  Use tylenol 1000 mg every 12 hours and add tramadol if needed   Use zofran for nausea, and resume omeprazole at 40 mg daily   Try Salon Pas patches,  TENS Units  for back pain

## 2023-09-11 DIAGNOSIS — G8929 Other chronic pain: Secondary | ICD-10-CM | POA: Insufficient documentation

## 2023-09-11 DIAGNOSIS — K2970 Gastritis, unspecified, without bleeding: Secondary | ICD-10-CM | POA: Insufficient documentation

## 2023-09-11 DIAGNOSIS — M546 Pain in thoracic spine: Secondary | ICD-10-CM | POA: Insufficient documentation

## 2023-09-11 NOTE — Assessment & Plan Note (Addendum)
 Suspected as the cause for her nausea , given recent urology evaluation with UTI ruled out.  Advised to dc meloxicam and rstart omeprazole 40 mg daily

## 2023-09-11 NOTE — Assessment & Plan Note (Signed)
 Vertebral fractures were apparently ruled out by [facility].  Outlined treatment plan for pain management using tylenol, tramadol,  Salon Pas patch and heating pad

## 2023-09-22 ENCOUNTER — Other Ambulatory Visit: Payer: Self-pay | Admitting: Internal Medicine

## 2023-10-04 DIAGNOSIS — J3489 Other specified disorders of nose and nasal sinuses: Secondary | ICD-10-CM | POA: Diagnosis not present

## 2023-10-04 DIAGNOSIS — R040 Epistaxis: Secondary | ICD-10-CM | POA: Diagnosis not present

## 2023-10-05 ENCOUNTER — Telehealth: Payer: Self-pay | Admitting: Pharmacy Technician

## 2023-10-05 ENCOUNTER — Other Ambulatory Visit (HOSPITAL_COMMUNITY): Payer: Self-pay

## 2023-10-05 NOTE — Telephone Encounter (Signed)
Pharmacy Patient Advocate Encounter   Received notification from CoverMyMeds that prior authorization for Ondansetron 4 mg tablet is required/requested.   Insurance verification completed.   The patient is insured through [employer] [facility] ADVANTAGE/RX ADVANCE .   Per test claim: PA required; PA submitted to above mentioned insurance via CoverMyMeds Key/confirmation #/EOC [facility] Status is pending

## 2023-10-07 ENCOUNTER — Encounter: Payer: Self-pay | Admitting: Internal Medicine

## 2023-10-07 NOTE — Telephone Encounter (Signed)
Pharmacy Patient Advocate Encounter  Received notification from [facility] ADVANTAGE/RX ADVANCE that Prior Authorization for Ondansetron 4 mg tablets has been DENIED.  Full denial letter will be uploaded to the media tab. See denial reason below.   PA #/Case ID/Reference #: [id number]  *this medication is not approved for nausea and vomiting associated with GERD. They will only approve if using for nausea and vomiting associated with chemo/radiation or post surgically

## 2023-10-12 ENCOUNTER — Other Ambulatory Visit: Payer: Self-pay | Admitting: Internal Medicine

## 2023-11-08 DIAGNOSIS — H0288A Meibomian gland dysfunction right eye, upper and lower eyelids: Secondary | ICD-10-CM | POA: Diagnosis not present

## 2023-11-08 DIAGNOSIS — M3501 Sicca syndrome with keratoconjunctivitis: Secondary | ICD-10-CM | POA: Diagnosis not present

## 2023-11-08 DIAGNOSIS — H16233 Neurotrophic keratoconjunctivitis, bilateral: Secondary | ICD-10-CM | POA: Diagnosis not present

## 2023-11-08 DIAGNOSIS — L718 Other rosacea: Secondary | ICD-10-CM | POA: Diagnosis not present

## 2023-11-08 DIAGNOSIS — H0288B Meibomian gland dysfunction left eye, upper and lower eyelids: Secondary | ICD-10-CM | POA: Diagnosis not present

## 2023-11-14 ENCOUNTER — Other Ambulatory Visit: Payer: Self-pay | Admitting: Internal Medicine

## 2023-11-30 ENCOUNTER — Ambulatory Visit: Payer: PPO | Admitting: Internal Medicine

## 2023-12-10 ENCOUNTER — Ambulatory Visit (INDEPENDENT_AMBULATORY_CARE_PROVIDER_SITE_OTHER): Admitting: Internal Medicine

## 2023-12-10 ENCOUNTER — Encounter: Payer: Self-pay | Admitting: Internal Medicine

## 2023-12-10 VITALS — BP 100/60 | HR 79 | Temp 98.3°F | Ht 63.0 in | Wt 140.2 lb

## 2023-12-10 DIAGNOSIS — I69398 Other sequelae of cerebral infarction: Secondary | ICD-10-CM

## 2023-12-10 DIAGNOSIS — D702 Other drug-induced agranulocytosis: Secondary | ICD-10-CM

## 2023-12-10 DIAGNOSIS — E032 Hypothyroidism due to medicaments and other exogenous substances: Secondary | ICD-10-CM

## 2023-12-10 DIAGNOSIS — D692 Other nonthrombocytopenic purpura: Secondary | ICD-10-CM

## 2023-12-10 DIAGNOSIS — M5442 Lumbago with sciatica, left side: Secondary | ICD-10-CM

## 2023-12-10 DIAGNOSIS — D508 Other iron deficiency anemias: Secondary | ICD-10-CM | POA: Diagnosis not present

## 2023-12-10 DIAGNOSIS — I69359 Hemiplegia and hemiparesis following cerebral infarction affecting unspecified side: Secondary | ICD-10-CM

## 2023-12-10 DIAGNOSIS — E7800 Pure hypercholesterolemia, unspecified: Secondary | ICD-10-CM | POA: Diagnosis not present

## 2023-12-10 DIAGNOSIS — F331 Major depressive disorder, recurrent, moderate: Secondary | ICD-10-CM

## 2023-12-10 DIAGNOSIS — R2689 Other abnormalities of gait and mobility: Secondary | ICD-10-CM | POA: Diagnosis not present

## 2023-12-10 DIAGNOSIS — G8929 Other chronic pain: Secondary | ICD-10-CM

## 2023-12-10 DIAGNOSIS — E034 Atrophy of thyroid (acquired): Secondary | ICD-10-CM

## 2023-12-10 DIAGNOSIS — M5441 Lumbago with sciatica, right side: Secondary | ICD-10-CM

## 2023-12-10 MED ORDER — TRAMADOL HCL 50 MG PO TABS: 50.0000 mg | ORAL_TABLET | Freq: Two times a day (BID) | ORAL | 5 refills | Status: DC | PRN

## 2023-12-10 NOTE — Progress Notes (Signed)
 Subjective:  Patient ID: Kim Santiago, female    DOB: [birth year]  Age: 89 y.o. MRN: [id number]  CC: The primary encounter diagnosis was Other iron deficiency anemia. Diagnoses of Pure hypercholesterolemia, Hypothyroidism due to acquired atrophy of thyroid, Purpura (HCC), Other drug-induced neutropenia (HCC), Major depressive disorder, recurrent episode, moderate (HCC), Impaired balance as late effect of cerebrovascular accident, Iatrogenic hypothyroidism, Hemiplegia affecting dominant side, post-stroke (HCC), and Chronic low back pain with bilateral sciatica, unspecified back pain laterality were also pertinent to this visit.   HPI [employer] presents for  Chief Complaint  Patient presents with   Follow-up    6 month    1)  Chronic back pain : her pain radiates to both legs.  She is requesting a refill on tramadol.  She has stopped  WALKING DUE TO LEG WEAKNESS  2)  She reports intermittent episodes of  NOCTURNAL VISUAL HALLUCINATIONS THAT LAST A COUPLE OF SECONDS    3)  Sjogren's:  Her dry eye syndrome is  is being treated by dr Valora Piccolo in [facility]  who is recommending a serum called vital eyes (using patient's own plasma?)  4) Chronic lower extremity edema:  she has been  taking lasix nearly daily  not using spironolactone    Outpatient Medications Prior to Visit  Medication Sig Dispense Refill   atorvastatin (LIPITOR) 20 MG tablet TAKE 1 TABLET BY MOUTH EVERY DAY AT 6 PM 90 tablet 3   Calcium Carbonate-Vitamin D 600-200 MG-UNIT TABS Take 2 tablets by mouth daily.     cholecalciferol (VITAMIN D) 1000 UNITS tablet Take 2,000 Units by mouth daily.     CRANBERRY PO Take 1 capsule by mouth 2 (two) times daily.     folic acid (FOLVITE) 1 MG tablet Take 2 mg by mouth daily.     furosemide (LASIX) 20 MG tablet TAKE 1 TABLET BY MOUTH EVERY OTHER DAY AS NEEDED 45 tablet 2   levothyroxine (SYNTHROID) 75 MCG tablet TAKE 1 TABLET BY MOUTH ( TOTAL) DAILY 90 tablet 2    losartan (COZAAR) 25 MG tablet Take 1 tablet (25 mg total) by mouth daily. 90 tablet 1   methotrexate (RHEUMATREX) 2.5 MG tablet Take 15 mg by mouth once a week.     metoprolol succinate (TOPROL-XL) 25 MG 24 hr tablet TAKE 1 TABLET BY MOUTH EVERY DAY 90 tablet 3   omeprazole (PRILOSEC) 20 MG capsule TAKE 1 CAPSULE (20 MG TOTAL) BY MOUTH 2 (TWO) TIMES DAILY BEFORE A MEAL. 180 capsule 1   ondansetron (ZOFRAN) 4 MG tablet Take 1 tablet (4 mg total) by mouth every 6 (six) hours. 20 tablet 0   polyethylene glycol (MIRALAX / GLYCOLAX) packet Take 17 g by mouth daily.     Probiotic Product (PROBIOTIC PO) Take 1 tablet by mouth daily.     sertraline (ZOLOFT) 25 MG tablet Take 1 tablet (25 mg total) by mouth daily. 90 tablet 1   SODIUM FLUORIDE 5000 PPM 1.1 % PSTE See admin instructions.     triamcinolone cream (KENALOG) 0.1 % APPLY TO AFFECTED AREA TWICE A DAY     fluticasone (FLONASE) 50 MCG/ACT nasal spray Place 1 spray into both nostrils daily. (Patient not taking: Reported on 12/10/2023)     meloxicam (MOBIC) 15 MG tablet Take 1 tablet (15 mg total) by mouth daily. 90 tablet 1   metroNIDAZOLE (METROGEL) 0.75 % gel Apply topically 2 (two) times daily.     tiZANidine (ZANAFLEX) 4 MG tablet Take  1 tablet (4 mg total) by mouth every 6 (six) hours as needed for muscle spasms. (Patient not taking: Reported on 12/10/2023) 30 tablet 0   No facility-administered medications prior to visit.    Review of Systems;  Patient denies headache, fevers, malaise, unintentional weight loss, skin rash, eye pain, sinus congestion and sinus pain, sore throat, dysphagia,  hemoptysis , cough, dyspnea, wheezing, chest pain, palpitations, orthopnea, edema, abdominal pain, nausea, melena, diarrhea, constipation, flank pain, dysuria, hematuria, urinary  Frequency, nocturia, numbness, tingling, seizures,  Focal weakness, Loss of consciousness,  Tremor, insomnia, depression, anxiety, and suicidal ideation.      Objective:  BP  100/60   Pulse 79   Temp 98.3 F (36.8 C) (Oral)   Ht 5\' 3"  (1.6 m)   Wt 140 lb 3.2 oz (63.6 kg)   SpO2 99%   BMI 24.84 kg/m   BP Readings from Last 3 Encounters:  12/10/23 100/60  08/27/23 (!) 121/58  07/25/23 132/66    Wt Readings from Last 3 Encounters:  12/10/23 140 lb 3.2 oz (63.6 kg)  09/10/23 138 lb (62.6 kg)  08/27/23 138 lb (62.6 kg)    Physical Exam Vitals reviewed.  Constitutional:      General: She is not in acute distress.    Appearance: Normal appearance. She is normal weight. She is not ill-appearing, toxic-appearing or diaphoretic.  HENT:     Head: Normocephalic.  Eyes:     General: No scleral icterus.       Right eye: No discharge.        Left eye: No discharge.     Conjunctiva/sclera: Conjunctivae normal.  Cardiovascular:     Rate and Rhythm: Normal rate and regular rhythm.     Heart sounds: Normal heart sounds.  Pulmonary:     Effort: Pulmonary effort is normal. No respiratory distress.     Breath sounds: Normal breath sounds.  Musculoskeletal:        General: Normal range of motion.  Skin:    General: Skin is warm and dry.  Neurological:     General: No focal deficit present.     Mental Status: She is alert and oriented to person, place, and time. Mental status is at baseline.  Psychiatric:        Mood and Affect: Mood normal.        Behavior: Behavior normal.        Thought Content: Thought content normal.        Judgment: Judgment normal.    Lab Results  Component Value Date   HGBA1C 4.9 01/19/2023   HGBA1C 4.9 11/08/2018    Lab Results  Component Value Date   CREATININE 0.80 12/10/2023   CREATININE 0.83 06/02/2023   CREATININE 0.91 01/19/2023    Lab Results  Component Value Date   WBC 4.3 06/02/2023   HGB 10.5 (L) 06/02/2023   HCT 30.5 (L) 06/02/2023   PLT 154.0 06/02/2023   GLUCOSE 122 (H) 12/10/2023   CHOL 85 01/19/2023   TRIG 95.0 01/19/2023   HDL 31.60 (L) 01/19/2023   LDLDIRECT 40.0 01/19/2023   LDLCALC 35  01/19/2023   ALT 7 12/10/2023   AST 14 12/10/2023   NA 137 12/10/2023   K 4.3 12/10/2023   CL 101 12/10/2023   CREATININE 0.80 12/10/2023   BUN 21 12/10/2023   CO2 28 12/10/2023   TSH 1.76 12/10/2023   INR 0.9 04/21/2022   HGBA1C 4.9 01/19/2023   MICROALBUR 1.1 01/19/2023    Abdomen  1 view (KUB) Result Date: 09/13/2023 CLINICAL DATA:  Right-sided flank pain for several days, initial encounter EXAM: ABDOMEN - 1 VIEW COMPARISON:  CT from 02/28/2018 FINDINGS: Scattered large and small bowel gas is noted. No obstructive changes are seen. No free air is noted. Kidneys are somewhat obscured by overlying bowel gas. No definitive renal or ureteral stones are seen. Changes of prior vertebral augmentation are identified. IMPRESSION: No acute abnormality noted. Electronically Signed   By: Alcide Clever M.D.   On: 09/13/2023 22:01    Assessment & Plan:  .Other iron deficiency anemia Assessment & Plan: She is no longer taking iron supplements due to intolerance (constipation).  Iron stores are currently indicate of inflammatory disease  Lab Results  Component Value Date   IRON 45 12/10/2023   TIBC 235 (L) 12/10/2023   FERRITIN 369 (H) 12/10/2023     Lab Results  Component Value Date   WBC 4.3 06/02/2023   HGB 10.5 (L) 06/02/2023   HCT 30.5 (L) 06/02/2023   MCV 115.9 Repeated and verified X2. (H) 06/02/2023   PLT 154.0 06/02/2023     Orders: -     Iron, TIBC and Ferritin Panel  Pure hypercholesterolemia -     Comprehensive metabolic panel with GFR  Hypothyroidism due to acquired atrophy of thyroid -     TSH  Purpura (HCC) Assessment & Plan: recurrent finding, .repeat level today is stable  Lab Results  Component Value Date   WBC 4.3 06/02/2023   HGB 10.5 (L) 06/02/2023   HCT 30.5 (L) 06/02/2023   MCV 115.9 Repeated and verified X2. (H) 06/02/2023   PLT 154.0 06/02/2023      Other drug-induced neutropenia [facility]) Assessment & Plan: Resolved by repeat CBC    Lab  Results  Component Value Date   WBC 4.3 06/02/2023   HGB 10.5 (L) 06/02/2023   HCT 30.5 (L) 06/02/2023   MCV 115.9 Repeated and verified X2. (H) 06/02/2023   PLT 154.0 06/02/2023      Major depressive disorder, recurrent episode, moderate (HCC) Assessment & Plan: She has been taking 25 mg zoloft and attributes her vivid dreams vs nocturnal hallucinations to the medication and wants to dc medication . Ok to discontinue     Impaired balance as late effect of cerebrovascular accident Assessment & Plan: She has become sedentary and developing lower extremity weakness .  She has deferred PT.  Encouraged to walk daily    Iatrogenic hypothyroidism Assessment & Plan: Thyroid function is normal on  75  mcg daily levothyroxine   Lab Results  Component Value Date   TSH 1.76 12/10/2023      Hemiplegia affecting dominant side, post-stroke [facility]) Assessment & Plan: She has grown unsteady on her feet and notes worsening balance. Home health PT recommended but deferred.  Encouraged to walk daily    Chronic low back pain with bilateral sciatica, unspecified back pain laterality Assessment & Plan: .  Outlined treatment plan for pain management using tylenol, tramadol,  Salon Pas patch and heating pad    Other orders -     traMADol HCl; Take 1 tablet (50 mg total) by mouth every 12 (twelve) hours as needed.  Dispense: 60 tablet; Refill: 5     I spent 34 minutes on the day of this face to face encounter reviewing patient's  most recent visit with orthopedics, dermatology, and  neurology,  prior relevant surgical and non surgical procedures, recent  labs and imaging studies, counseling on hemiparesis management ,  reviewing the assessment and plan with patient, and post visit ordering and reviewing of  diagnostics and therapeutics with patient  .   Follow-up: Return in about 6 months (around 06/10/2024).  Management of hemiparesis

## 2023-12-10 NOTE — Patient Instructions (Addendum)
 By now you have probably read the message from [facility] about a miscalculation on the test that measures the albumin to creatinine ratio which helps [place] determine if early kidney disease is present. I have reviewed your most recent screening test for nephropathy, which was done in May and your corrected ratio is NORMAL   You can add up to 3000 mg of acetominophen (tylenol) every day safely  In divided doses (Or 1000 mg every 8 hours.)   NO MELOXICAM  YOU CAN USE TRAMADOL UP TO TWO TIMES DAILY if the tylenol is not controlling your pain  YOU NEED TO WALK .  EVERY DAY ,  OR YOU WILL END UP IN A WHEEL CHAIR.  START WITH 5 MINUTES AND GRADUALLY INCREASE YOUR TIME TO  19,  THEN 15,  THEN 20     YOU CAN STOP THE ZOLOFT TO SEE IF THE HALLUCINATIONS AT NIGHT STOP   CONTINUE LIPITOR because this prevents heart attacks and strokers

## 2023-12-11 LAB — IRON,TIBC AND FERRITIN PANEL
%SAT: 19 % (ref 16–45)
Ferritin: 369 ng/mL — ABNORMAL HIGH (ref 16–288)
Iron: 45 ug/dL (ref 45–160)
TIBC: 235 ug/dL — ABNORMAL LOW (ref 250–450)

## 2023-12-11 LAB — COMPREHENSIVE METABOLIC PANEL WITH GFR
AG Ratio: 1.9 (calc) (ref 1.0–2.5)
ALT: 7 U/L (ref 6–29)
AST: 14 U/L (ref 10–35)
Albumin: 3.8 g/dL (ref 3.6–5.1)
Alkaline phosphatase (APISO): 75 U/L (ref 37–153)
BUN: 21 mg/dL (ref 7–25)
CO2: 28 mmol/L (ref 20–32)
Calcium: 8.5 mg/dL — ABNORMAL LOW (ref 8.6–10.4)
Chloride: 101 mmol/L (ref 98–110)
Creat: 0.8 mg/dL (ref 0.60–0.95)
Globulin: 2 g/dL (ref 1.9–3.7)
Glucose, Bld: 122 mg/dL — ABNORMAL HIGH (ref 65–99)
Potassium: 4.3 mmol/L (ref 3.5–5.3)
Sodium: 137 mmol/L (ref 135–146)
Total Bilirubin: 0.5 mg/dL (ref 0.2–1.2)
Total Protein: 5.8 g/dL — ABNORMAL LOW (ref 6.1–8.1)
eGFR: 72 mL/min/{1.73_m2} (ref >=60)

## 2023-12-11 LAB — TSH: TSH: 1.76 m[IU]/L (ref 0.40–4.50)

## 2023-12-12 ENCOUNTER — Encounter: Payer: Self-pay | Admitting: Internal Medicine

## 2023-12-12 NOTE — Assessment & Plan Note (Signed)
 Thyroid function is normal on  75  mcg daily levothyroxine   Lab Results  Component Value Date   TSH 1.76 12/10/2023

## 2023-12-12 NOTE — Assessment & Plan Note (Signed)
 recurrent finding, .repeat level today is stable  Lab Results  Component Value Date   WBC 4.3 06/02/2023   HGB 10.5 (L) 06/02/2023   HCT 30.5 (L) 06/02/2023   MCV 115.9 Repeated and verified X2. (H) 06/02/2023   PLT 154.0 06/02/2023

## 2023-12-12 NOTE — Assessment & Plan Note (Signed)
.    Outlined treatment plan for pain management using tylenol, tramadol,  Salon Pas patch and heating pad

## 2023-12-12 NOTE — Assessment & Plan Note (Signed)
 Resolved by repeat CBC    Lab Results  Component Value Date   WBC 4.3 06/02/2023   HGB 10.5 (L) 06/02/2023   HCT 30.5 (L) 06/02/2023   MCV 115.9 Repeated and verified X2. (H) 06/02/2023   PLT 154.0 06/02/2023

## 2023-12-12 NOTE — Assessment & Plan Note (Signed)
 She is no longer taking iron supplements due to intolerance (constipation).  Iron stores are currently indicate of inflammatory disease  Lab Results  Component Value Date   IRON 45 12/10/2023   TIBC 235 (L) 12/10/2023   FERRITIN 369 (H) 12/10/2023     Lab Results  Component Value Date   WBC 4.3 06/02/2023   HGB 10.5 (L) 06/02/2023   HCT 30.5 (L) 06/02/2023   MCV 115.9 Repeated and verified X2. (H) 06/02/2023   PLT 154.0 06/02/2023

## 2023-12-12 NOTE — Assessment & Plan Note (Addendum)
 She has become sedentary and developing lower extremity weakness .  She has deferred PT.  Encouraged to walk daily

## 2023-12-12 NOTE — Assessment & Plan Note (Addendum)
 She has been taking 25 mg zoloft and attributes her vivid dreams vs nocturnal hallucinations to the medication and wants to dc medication . Ok to discontinue

## 2023-12-12 NOTE — Assessment & Plan Note (Signed)
 She has grown unsteady on her feet and notes worsening balance. Home health PT recommended but deferred.  Encouraged to walk daily

## 2023-12-14 DIAGNOSIS — L718 Other rosacea: Secondary | ICD-10-CM | POA: Diagnosis not present

## 2023-12-14 DIAGNOSIS — H0288A Meibomian gland dysfunction right eye, upper and lower eyelids: Secondary | ICD-10-CM | POA: Diagnosis not present

## 2023-12-14 DIAGNOSIS — M3501 Sicca syndrome with keratoconjunctivitis: Secondary | ICD-10-CM | POA: Diagnosis not present

## 2023-12-14 DIAGNOSIS — H0288B Meibomian gland dysfunction left eye, upper and lower eyelids: Secondary | ICD-10-CM | POA: Diagnosis not present

## 2023-12-14 DIAGNOSIS — H16233 Neurotrophic keratoconjunctivitis, bilateral: Secondary | ICD-10-CM | POA: Diagnosis not present

## 2023-12-17 ENCOUNTER — Other Ambulatory Visit: Payer: Self-pay | Admitting: Internal Medicine

## 2023-12-17 DIAGNOSIS — Z9012 Acquired absence of left breast and nipple: Secondary | ICD-10-CM

## 2023-12-28 DIAGNOSIS — C50912 Malignant neoplasm of unspecified site of left female breast: Secondary | ICD-10-CM | POA: Diagnosis not present

## 2024-01-10 ENCOUNTER — Other Ambulatory Visit: Payer: Self-pay | Admitting: Internal Medicine

## 2024-01-10 DIAGNOSIS — E034 Atrophy of thyroid (acquired): Secondary | ICD-10-CM

## 2024-02-17 DIAGNOSIS — H16231 Neurotrophic keratoconjunctivitis, right eye: Secondary | ICD-10-CM | POA: Diagnosis not present

## 2024-02-17 DIAGNOSIS — L931 Subacute cutaneous lupus erythematosus: Secondary | ICD-10-CM | POA: Diagnosis not present

## 2024-02-17 DIAGNOSIS — H02209 Unspecified lagophthalmos unspecified eye, unspecified eyelid: Secondary | ICD-10-CM | POA: Diagnosis not present

## 2024-02-17 DIAGNOSIS — L718 Other rosacea: Secondary | ICD-10-CM | POA: Diagnosis not present

## 2024-02-17 DIAGNOSIS — D692 Other nonthrombocytopenic purpura: Secondary | ICD-10-CM | POA: Diagnosis not present

## 2024-02-17 DIAGNOSIS — Z79899 Other long term (current) drug therapy: Secondary | ICD-10-CM | POA: Diagnosis not present

## 2024-02-17 DIAGNOSIS — M3501 Sicca syndrome with keratoconjunctivitis: Secondary | ICD-10-CM | POA: Diagnosis not present

## 2024-02-17 DIAGNOSIS — H0288A Meibomian gland dysfunction right eye, upper and lower eyelids: Secondary | ICD-10-CM | POA: Diagnosis not present

## 2024-02-17 DIAGNOSIS — H0288B Meibomian gland dysfunction left eye, upper and lower eyelids: Secondary | ICD-10-CM | POA: Diagnosis not present

## 2024-02-21 DIAGNOSIS — H0288A Meibomian gland dysfunction right eye, upper and lower eyelids: Secondary | ICD-10-CM | POA: Diagnosis not present

## 2024-02-21 DIAGNOSIS — H16233 Neurotrophic keratoconjunctivitis, bilateral: Secondary | ICD-10-CM | POA: Diagnosis not present

## 2024-02-21 DIAGNOSIS — H02209 Unspecified lagophthalmos unspecified eye, unspecified eyelid: Secondary | ICD-10-CM | POA: Diagnosis not present

## 2024-02-21 DIAGNOSIS — L718 Other rosacea: Secondary | ICD-10-CM | POA: Diagnosis not present

## 2024-02-21 DIAGNOSIS — H0288B Meibomian gland dysfunction left eye, upper and lower eyelids: Secondary | ICD-10-CM | POA: Diagnosis not present

## 2024-02-21 DIAGNOSIS — M3501 Sicca syndrome with keratoconjunctivitis: Secondary | ICD-10-CM | POA: Diagnosis not present

## 2024-03-30 ENCOUNTER — Other Ambulatory Visit: Payer: Self-pay | Admitting: Internal Medicine

## 2024-04-09 ENCOUNTER — Other Ambulatory Visit: Payer: Self-pay | Admitting: Internal Medicine

## 2024-04-12 DIAGNOSIS — L718 Other rosacea: Secondary | ICD-10-CM | POA: Diagnosis not present

## 2024-04-12 DIAGNOSIS — H0288B Meibomian gland dysfunction left eye, upper and lower eyelids: Secondary | ICD-10-CM | POA: Diagnosis not present

## 2024-04-12 DIAGNOSIS — H16232 Neurotrophic keratoconjunctivitis, left eye: Secondary | ICD-10-CM | POA: Diagnosis not present

## 2024-04-12 DIAGNOSIS — M3501 Sicca syndrome with keratoconjunctivitis: Secondary | ICD-10-CM | POA: Diagnosis not present

## 2024-04-12 DIAGNOSIS — H0288A Meibomian gland dysfunction right eye, upper and lower eyelids: Secondary | ICD-10-CM | POA: Diagnosis not present

## 2024-04-12 DIAGNOSIS — H02209 Unspecified lagophthalmos unspecified eye, unspecified eyelid: Secondary | ICD-10-CM | POA: Diagnosis not present

## 2024-04-17 DIAGNOSIS — H0288A Meibomian gland dysfunction right eye, upper and lower eyelids: Secondary | ICD-10-CM | POA: Diagnosis not present

## 2024-04-17 DIAGNOSIS — L718 Other rosacea: Secondary | ICD-10-CM | POA: Diagnosis not present

## 2024-04-17 DIAGNOSIS — H16233 Neurotrophic keratoconjunctivitis, bilateral: Secondary | ICD-10-CM | POA: Diagnosis not present

## 2024-04-17 DIAGNOSIS — H02209 Unspecified lagophthalmos unspecified eye, unspecified eyelid: Secondary | ICD-10-CM | POA: Diagnosis not present

## 2024-04-17 DIAGNOSIS — M3501 Sicca syndrome with keratoconjunctivitis: Secondary | ICD-10-CM | POA: Diagnosis not present

## 2024-04-17 DIAGNOSIS — H0288B Meibomian gland dysfunction left eye, upper and lower eyelids: Secondary | ICD-10-CM | POA: Diagnosis not present

## 2024-04-18 ENCOUNTER — Ambulatory Visit: Payer: PPO | Admitting: *Deleted

## 2024-04-18 ENCOUNTER — Telehealth: Payer: Self-pay | Admitting: *Deleted

## 2024-04-18 VITALS — Ht 63.0 in | Wt 132.0 lb

## 2024-04-18 DIAGNOSIS — Z0000 Encounter for general adult medical examination without abnormal findings: Secondary | ICD-10-CM

## 2024-04-18 NOTE — Telephone Encounter (Addendum)
 Performed AWV While on the phone with the patient she stated that she just feels bad all over. Patient complained of being nauseated off and on for several weeks. Patient stated that she has been having loose stools for several weeks. Patient was advised the Miralax  may be the causing the loose stools. Patient has an upcoming appointment scheduled with you 06/06/24. Patient was offered a sooner appointment which she declined stating that she will just wait until that appointment. Advised patient if her symptoms get worse to call the office for a sooner appointment.  Please see the depression screening.

## 2024-04-18 NOTE — Progress Notes (Signed)
 Subjective:   Kim Santiago is a 89 y.o. who presents for a Medicare Wellness preventive visit.  As a reminder, Annual Wellness Visits don't include a physical exam, and some assessments may be limited, especially if this visit is performed virtually. We may recommend an in-person follow-up visit with your provider if needed.  Visit Complete: Virtual I connected with  Kim Santiago on 04/18/24 by a audio enabled telemedicine application and verified that I am speaking with the correct person using two identifiers.  Patient Location: Home  Provider Location: Home Office  I discussed the limitations of evaluation and management by telemedicine. The patient expressed understanding and agreed to proceed.  Vital Signs: Because this visit was a virtual/telehealth visit, some criteria may be missing or patient reported. Any vitals not documented were not able to be obtained and vitals that have been documented are patient reported.  VideoDeclined- This patient declined Librarian, academic. Therefore the visit was completed with audio only.  Persons Participating in Visit: Patient assisted by Daughter Kim Santiago.  AWV Questionnaire: No: Patient Medicare AWV questionnaire was not completed prior to this visit.  Cardiac Risk Factors include: advanced age (>95men, >40 women);dyslipidemia;hypertension     Objective:    Today's Vitals   04/18/24 1026  Weight: 132 lb (59.9 kg)  Height: 5' 3 (1.6 m)   Body mass index is 23.38 kg/m.     07/25/2023   10:00 AM 04/16/2023   10:22 AM 04/13/2022   10:42 AM 01/23/2022    1:19 PM 01/06/2022    1:32 PM 01/06/2022   12:35 PM 06/30/2021    2:57 PM  Advanced Directives  Does Patient Have a Medical Advance Directive? Yes Yes Yes Yes Yes Yes Yes  Type of Estate agent of [place];Living will Healthcare Power of [place];Living will Healthcare Power of [place];Living will Living will;Healthcare Power of  [employer] Power of [place];Living will Healthcare Power of [place];Living will Healthcare Power of [place];Living will;Out of facility DNR (pink MOST or yellow form)  Does patient want to make changes to medical advance directive?  No - Patient declined No - Patient declined   No - Patient declined No - Patient declined  Copy of Healthcare Power of Attorney in Chart?  Yes - validated most recent copy scanned in chart (See row information) Yes - validated most recent copy scanned in chart (See row information)  Yes - validated most recent copy scanned in chart (See row information)  Yes - validated most recent copy scanned in chart (See row information)  Would patient like information on creating a medical advance directive?    No - Patient declined No - Patient declined    Pre-existing out of facility DNR order (yellow form or pink MOST form)       Yellow form placed in chart (order not valid for inpatient use)    Current Medications (verified) Outpatient Encounter Medications as of 04/18/2024  Medication Sig   atorvastatin  (LIPITOR) 20 MG tablet TAKE 1 TABLET BY MOUTH EVERY DAY AT 6 PM   Calcium  Carbonate-Vitamin D  600-200 MG-UNIT TABS Take 2 tablets by mouth daily.   cholecalciferol  (VITAMIN D ) 1000 UNITS tablet Take 2,000 Units by mouth daily. (Patient taking differently: Take 2,000 Units by mouth daily.)   CRANBERRY PO Take 1 capsule by mouth 2 (two) times daily.   fluticasone (FLONASE) 50 MCG/ACT nasal spray Place 1 spray into both nostrils daily. (Patient taking differently: Place 1 spray into both nostrils daily  as needed.)   folic acid  (FOLVITE ) 1 MG tablet Take 2 mg by mouth daily.   furosemide  (LASIX ) 20 MG tablet TAKE 1 TABLET BY MOUTH EVERY OTHER DAY AS NEEDED   levothyroxine  (SYNTHROID ) 75 MCG tablet TAKE 1 TABLET BY MOUTH ( TOTAL) DAILY   losartan  (COZAAR ) 25 MG tablet Take 1 tablet (25 mg total) by mouth daily.   methotrexate  (RHEUMATREX) 2.5 MG tablet Take 15 mg by  mouth once a week.   metoprolol  succinate (TOPROL -XL) 25 MG 24 hr tablet TAKE 1 TABLET BY MOUTH EVERY DAY   omeprazole  (PRILOSEC) 20 MG capsule TAKE 1 CAPSULE (20 MG TOTAL) BY MOUTH 2 (TWO) TIMES DAILY BEFORE A MEAL. (Patient taking differently: Take 20 mg by mouth 2 (two) times daily as needed.)   ondansetron  (ZOFRAN ) 4 MG tablet Take 1 tablet (4 mg total) by mouth every 6 (six) hours.   polyethylene glycol (MIRALAX  / GLYCOLAX ) packet Take 17 g by mouth daily.   Probiotic Product (PROBIOTIC PO) Take 1 tablet by mouth daily.   sertraline  (ZOLOFT ) 25 MG tablet TAKE 1 TABLET (25 MG TOTAL) BY MOUTH DAILY.   SODIUM FLUORIDE 5000 PPM 1.1 % PSTE See admin instructions.   tiZANidine  (ZANAFLEX ) 4 MG tablet Take 1 tablet (4 mg total) by mouth every 6 (six) hours as needed for muscle spasms.   traMADol  (ULTRAM ) 50 MG tablet Take 1 tablet (50 mg total) by mouth every 6 (six) hours as needed for up to 7 days.   traMADol  (ULTRAM ) 50 MG tablet Take 1 tablet (50 mg total) by mouth every 12 (twelve) hours as needed.   triamcinolone  cream (KENALOG ) 0.1 % APPLY TO AFFECTED AREA TWICE A DAY   meloxicam  (MOBIC ) 15 MG tablet TAKE 1 TABLET (15 MG TOTAL) BY MOUTH DAILY. (Patient not taking: Reported on 04/18/2024)   metroNIDAZOLE (METROGEL) 0.75 % gel Apply topically 2 (two) times daily. (Patient not taking: Reported on 04/18/2024)   No facility-administered encounter medications on file as of 04/18/2024.    Allergies (verified) Naprosyn [naproxen], Amoxicillin , Azathioprine , Codeine, Hydroxychloroquine, Mycophenolate mofetil, Orudis [ketoprofen], Sulfa antibiotics, and Sulfathiazole   History: Past Medical History:  Diagnosis Date   Arthritis    Breast cancer (HCC) 2017   left mastectomy done 11/2015   Breast cancer in female [facility]) 11/18/2015   Left: 3.9 cm tumor, T2, 1/2 sentinel nodes positive for macro metastatic disease, N1, 3 negative nodes in the axillary tail, ER+,PR+, Her 2 neu, low Mammoprint score    Cancer (HCC)    thyroid  takes levothyroxine    CAP (community acquired pneumonia) 05/05/2021   Chronic kidney disease    UTI   Genetic screening 11/2015   Mammoprint of left breast cancer: Low risk for recurrence.    History of heart attack 06/12/2014   Hypertension    hypothyroidism    secondary to thyroidectomy for thyroid  ca   Hypothyroidism    Lupus    subcutaneous   Menopause 40s   natural, hot flashes and mood lability now gone, off prempro 7 months   Myocardial infarction [facility]) 2013   Osteoporosis    Osteopenia   Rosacea    Sjoegren syndrome    Stress-induced cardiomyopathy September of 2013   EF 35%. Peak troponin was 1.8.   Stroke [facility]) 10/2018   Past Surgical History:  Procedure Laterality Date   BACK SURGERY     BREAST BIOPSY Left 10/30/15   positive, done in Dr. Fredirick office   CARDIAC CATHETERIZATION  05/2012  [facility]. No significant CAD. Ejection fraction of 35% due to stress-induced cardiomyopathy.   CHOLECYSTECTOMY     COLONOSCOPY     DILATION AND CURETTAGE OF UTERUS     KYPHOSIS SURGERY  Feb 2008   L1, Dr. Kathi   LUMBAR DISC SURGERY     L4-L5   MASTECTOMY Left 11/18/2015   positive   SENTINEL NODE BIOPSY Left 11/18/2015   Procedure: SENTINEL NODE BIOPSY;  Surgeon: Reyes LELON Cota, MD;  Location: [facility] ORS;  Service: General;  Laterality: Left;   SHOULDER ARTHROSCOPY  2004   Left, Dr. Maryl   SIMPLE MASTECTOMY WITH AXILLARY SENTINEL NODE BIOPSY Left 11/18/2015   Procedure: SIMPLE MASTECTOMY;  Surgeon: Reyes LELON Cota, MD;  Location: [facility] ORS;  Service: General;  Laterality: Left;   SPINE SURGERY     L4-5 diskectomy   THYROIDECTOMY     Thyroid  Cancer   TONSILLECTOMY     TUBAL LIGATION     Family History  Problem Relation Age of Onset   Kidney disease Mother    Heart disease Mother    COPD Father    Cancer Father        esophageal   Kidney disease Sister    Cancer Brother 19       colon cancer (both brothers)   Cancer Brother    Heart  attack Brother 46   Heart disease Brother    Breast cancer Neg Hx    Social History   Socioeconomic History   Marital status: Widowed    Spouse name: Not on file   Number of children: 1   Years of education: college   Highest education level: Not on file  Occupational History   Not on file  Tobacco Use   Smoking status: Never   Smokeless tobacco: Never  Vaping Use   Vaping status: Never Used  Substance and Sexual Activity   Alcohol use: No   Drug use: No   Sexual activity: Not Currently  Other Topics Concern   Not on file  Social History Narrative   Has 1 adopted daughter.   Social Drivers of Corporate investment banker Strain: Low Risk  (04/18/2024)   Overall Financial Resource Strain (CARDIA)    Difficulty of Paying Living Expenses: Not hard at all  Food Insecurity: No Food Insecurity (04/18/2024)   Hunger Vital Sign    Worried About Running Out of Food in the Last Year: Never true    Ran Out of Food in the Last Year: Never true  Transportation Needs: No Transportation Needs (04/18/2024)   PRAPARE - Administrator, Civil Service (Medical): No    Lack of Transportation (Non-Medical): No  Physical Activity: Insufficiently Active (04/18/2024)   Exercise Vital Sign    Days of Exercise per Week: 3 days    Minutes of Exercise per Session: 10 min  Stress: No Stress Concern Present (04/18/2024)   [employer] of Occupational Health - Occupational Stress Questionnaire    Feeling of Stress: Only a little  Social Connections: Moderately Isolated (04/18/2024)   Social Connection and Isolation Panel    Frequency of Communication with Friends and Family: More than three times a week    Frequency of Social Gatherings with Friends and Family: More than three times a week    Attends Religious Services: More than 4 times per year    Active Member of [employer] or Organizations: No    Attends Banker Meetings: Never    Marital  Status: Widowed    Tobacco  Counseling Counseling given: Not Answered    Clinical Intake:  Pre-visit preparation completed: Yes  Pain : No/denies pain     BMI - recorded: 23.38 Nutritional Status: BMI of 19-24  Normal Nutritional Risks: Nausea/ vomitting/ diarrhea (nausea off and on for weeks) Diabetes: No  Lab Results  Component Value Date   HGBA1C 4.9 01/19/2023   HGBA1C 4.9 11/08/2018     How often do you need to have someone help you when you read instructions, pamphlets, or other written materials from your doctor or pharmacy?: 1 - Never  Interpreter Needed?: No  Information entered by :: R. Kendelle Schweers LPN   Activities of Daily Living     04/18/2024   10:33 AM  In your present state of health, do you have any difficulty performing the following activities:  Hearing? 1  Vision? 0  Difficulty concentrating or making decisions? 1  Walking or climbing stairs? 1  Dressing or bathing? 0  Doing errands, shopping? 1  Preparing Food and eating ? N  Using the Toilet? N  In the past six months, have you accidently leaked urine? N  Do you have problems with loss of bowel control? N  Managing your Medications? N  Managing your Finances? N  Housekeeping or managing your Housekeeping? N    Patient Care Team: Marylynn Verneita CROME, MD as PCP - General (Internal Medicine) Marylynn Verneita CROME, MD (Internal Medicine) Dessa Reyes ORN, MD (General Surgery) Isenstein, Arin L, MD (Dermatology)  I have updated your Care Teams any recent Medical Services you may have received from other providers in the past year.     Assessment:   This is a routine wellness examination for Kim Santiago.  Hearing/Vision screen Hearing Screening - Comments:: Wears aids Vision Screening - Comments:: glasses   Goals Addressed             This Visit's Progress    Patient Stated       Wants to walk more and work on her balance       Depression Screen     04/18/2024   10:45 AM 12/10/2023    4:32 PM 09/10/2023    5:18 PM  04/16/2023   10:16 AM 01/19/2023    3:44 PM 07/15/2022    3:06 PM 04/13/2022   10:42 AM  PHQ 2/9 Scores  PHQ - 2 Score 1 0 4 0 0 2 0  PHQ- 9 Score 9 2 9 4  8      Fall Risk     04/18/2024   10:35 AM 12/10/2023    4:31 PM 09/10/2023    5:18 PM 04/16/2023   10:05 AM 01/19/2023    3:44 PM  Fall Risk   Falls in the past year? 0 0 0 0 0  Number falls in past yr: 0 0 0 0 0  Injury with Fall? 0 0 0 0 0  Risk for fall due to : No Fall Risks No Fall Risks No Fall Risks No Fall Risks No Fall Risks  Follow up Falls evaluation completed;Falls prevention discussed Falls prevention discussed Falls evaluation completed Falls prevention discussed;Falls evaluation completed;Education provided Falls evaluation completed    MEDICARE RISK AT HOME:  Medicare Risk at Home Any stairs in or around the home?: No If so, are there any without handrails?: No Home free of loose throw rugs in walkways, pet beds, electrical cords, etc?: Yes Adequate lighting in your home to reduce risk of falls?: Yes Life  alert?: No Use of a cane, walker or w/c?: Yes Grab bars in the bathroom?: Yes Shower chair or bench in shower?: Yes Elevated toilet seat or a handicapped toilet?: Yes  TIMED UP AND GO:  Was the test performed?  No  Cognitive Function: 6CIT completed    03/31/2018    1:25 PM 03/30/2017    1:37 PM 08/12/2015    3:06 PM  MMSE - Mini Mental State Exam  Orientation to time 5 5  5    Orientation to Place 5 5  5    Registration 3 3  3    Attention/ Calculation 5 5  5    Recall 3 3  3    Language- name 2 objects 2 2  2    Language- repeat 1 1 1   Language- follow 3 step command 3 3  3    Language- read & follow direction 1 1  1    Write a sentence 1 1  1    Copy design 1 1  1    Total score 30 30  30       Data saved with a previous flowsheet row definition        04/18/2024   10:50 AM 04/16/2023   10:23 AM 04/03/2020    9:51 AM 04/03/2019    9:57 AM  6CIT Screen  What Year? 0 points 0 points 0 points 0 points  What  month? 0 points 0 points 0 points 0 points  What time? 0 points 0 points 0 points 0 points  Count back from 20 0 points 0 points 0 points 0 points  Months in reverse 0 points 0 points 0 points 0 points  Repeat phrase 0 points 0 points 0 points 0 points  Total Score 0 points 0 points 0 points 0 points    Immunizations Immunization History  Administered Date(s) Administered   Fluad Quad(high Dose 65+) 06/18/2021, 07/15/2022   Fluad Trivalent(High Dose 65+) 06/02/2023   Influenza Split 07/08/2012   Influenza, High Dose Seasonal PF 06/08/2016, 06/11/2017, 06/06/2019   Influenza,inj,Quad PF,6+ Mos 06/08/2013, 05/04/2014, 05/06/2015   Influenza-Unspecified 05/04/2018, 06/04/2020   Moderna Sars-Covid-2 Vaccination 08/13/2020   PFIZER(Purple Top)SARS-COV-2 Vaccination 09/27/2019, 10/21/2019   Pneumococcal Conjugate-13 03/06/2014   Pneumococcal Polysaccharide-23 10/04/2018   Pneumococcal-Unspecified 10/11/2007   Tdap 01/05/2013, 07/30/2023   Zoster, Live 01/12/2012    Screening Tests Health Maintenance  Topic Date Due   Zoster Vaccines- Shingrix (1 of 2) 01/04/1954   COVID-19 Vaccine (4 - 2024-25 season) 05/09/2023   Medicare Annual Wellness (AWV)  04/15/2024   INFLUENZA VACCINE  04/07/2024   DTaP/Tdap/Td (3 - Td or Tdap) 07/29/2033   Pneumococcal Vaccine: 50+ Years  Completed   DEXA SCAN  Completed   Hepatitis B Vaccines  Aged Out   HPV VACCINES  Aged Out   Meningococcal B Vaccine  Aged Out    Health Maintenance  Health Maintenance Due  Topic Date Due   Zoster Vaccines- Shingrix (1 of 2) 01/04/1954   COVID-19 Vaccine (4 - 2024-25 season) 05/09/2023   Medicare Annual Wellness (AWV)  04/15/2024   INFLUENZA VACCINE  04/07/2024   Health Maintenance Items Addressed: Discussed the need to update shingles vaccines and a flu vaccine annually. Patient stated that she will discuss the covid vaccine with her PCP. Patient declines Dexa scan at this time.  Additional  Screening:  Vision Screening: Recommended annual ophthalmology exams for early detection of glaucoma and other disorders of the eye. Up to date [facility] Would you like a referral to an eye  doctor? No    Dental Screening: Recommended annual dental exams for proper oral hygiene  Community Resource Referral / Chronic Care Management: CRR required this visit?  No   CCM required this visit?  No   Plan:    I have personally reviewed and noted the following in the patient's chart:   Medical and social history Use of alcohol, tobacco or illicit drugs  Current medications and supplements including opioid prescriptions. Patient is currently taking opioid prescriptions. Information provided to patient regarding non-opioid alternatives. Patient advised to discuss non-opioid treatment plan with their provider. Functional ability and status Nutritional status Physical activity Advanced directives List of other physicians Hospitalizations, surgeries, and ER visits in previous 12 months Vitals Screenings to include cognitive, depression, and falls Referrals and appointments  In addition, I have reviewed and discussed with patient certain preventive protocols, quality metrics, and best practice recommendations. A written personalized care plan for preventive services as well as general preventive health recommendations were provided to patient.   Angeline Fredericks, LPN   [id number]   After Visit Summary: (MyChart) Due to this being a telephonic visit, the after visit summary with patients personalized plan was offered to patient via MyChart   Notes: Nothing significant to report at this time. Phone note sent to PCP

## 2024-04-18 NOTE — Patient Instructions (Signed)
 Kim Santiago , Thank you for taking time out of your busy schedule to complete your Annual Wellness Visit with me. I enjoyed our conversation and look forward to speaking with you again next year. I, as well as your care team,  appreciate your ongoing commitment to your health goals. Please review the following plan we discussed and let me know if I can assist you in the future. Your Game plan/ To Do List    Referrals: If you haven't heard from the office you've been referred to, please reach out to them at the phone provided.  Remember to get your annual flu vaccine and consider updating your shingles vaccines.  Follow up Visits: We will see or speak with you next year for your Next Medicare AWV with our clinical staff 04/23/25 @ 3:40 Have you seen your provider in the last 6 months (3 months if uncontrolled diabetes)? Yes  Clinician Recommendations:  Aim for 30 minutes of exercise or brisk walking, 6-8 glasses of water, and 5 servings of fruits and vegetables each day.       This is a list of the screenings recommended for you:  Health Maintenance  Topic Date Due   Zoster (Shingles) Vaccine (1 of 2) 01/04/1954   COVID-19 Vaccine (4 - 2024-25 season) 05/09/2023   Flu Shot  04/07/2024   Medicare Annual Wellness Visit  04/18/2025   DTaP/Tdap/Td vaccine (3 - Td or Tdap) 07/29/2033   Pneumococcal Vaccine for age over 70  Completed   DEXA scan (bone density measurement)  Completed   Hepatitis B Vaccine  Aged Out   HPV Vaccine  Aged Out   Meningitis B Vaccine  Aged Out    Advanced directives: (In Chart) A copy of your advanced directives are scanned into your chart should your provider ever need it. Advance Care Planning is important because it:  [x]  Makes sure you receive the medical care that is consistent with your values, goals, and preferences  [x]  It provides guidance to your family and loved ones and reduces their decisional burden about whether or not they are making the right  decisions based on your wishes.  Follow the link provided in your after visit summary or read over the paperwork we have mailed to you to help you started getting your Advance Directives in place. If you need assistance in completing these, please reach out to us  so that we can help you!   Managing Pain Without Opioids Opioids are strong medicines used to treat moderate to severe pain. For some people, especially those who have long-term (chronic) pain, opioids may not be the best choice for pain management due to: Side effects like nausea, constipation, and sleepiness. The risk of addiction (opioid use disorder). The longer you take opioids, the greater your risk of addiction. Pain that lasts for more than 3 months is called chronic pain. Managing chronic pain usually requires more than one approach and is often provided by a team of health care providers working together (multidisciplinary approach). Pain management may be done at a pain management center or pain clinic. How to manage pain without the use of opioids Use non-opioid medicines Non-opioid medicines for pain may include: Over-the-counter or prescription non-steroidal anti-inflammatory drugs (NSAIDs). These may be the first medicines used for pain. They work well for muscle and bone pain, and they reduce swelling. Acetaminophen . This over-the-counter medicine may work well for milder pain but not swelling. Antidepressants. These may be used to treat chronic pain. A certain type  of antidepressant (tricyclics) is often used. These medicines are given in lower doses for pain than when used for depression. Anticonvulsants. These are usually used to treat seizures but may also reduce nerve (neuropathic) pain. Muscle relaxants. These relieve pain caused by sudden muscle tightening (spasms). You may also use a pain medicine that is applied to the skin as a patch, cream, or gel (topical analgesic), such as a numbing medicine. These may cause  fewer side effects than medicines taken by mouth. Do certain therapies as directed Some therapies can help with pain management. They include: Physical therapy. You will do exercises to gain strength and flexibility. A physical therapist may teach you exercises to move and stretch parts of your body that are weak, stiff, or painful. You can learn these exercises at physical therapy visits and practice them at home. Physical therapy may also involve: Massage. Heat wraps or applying heat or cold to affected areas. Electrical signals that interrupt pain signals (transcutaneous electrical nerve stimulation, TENS). Weak lasers that reduce pain and swelling (low-level laser therapy). Signals from your body that help you learn to regulate pain (biofeedback). Occupational therapy. This helps you to learn ways to function at home and work with less pain. Recreational therapy. This involves trying new activities or hobbies, such as a physical activity or drawing. Mental health therapy, including: Cognitive behavioral therapy (CBT). This helps you learn coping skills for dealing with pain. Acceptance and commitment therapy (ACT) to change the way you think and react to pain. Relaxation therapies, including muscle relaxation exercises and mindfulness-based stress reduction. Pain management counseling. This may be individual, family, or group counseling.  Receive medical treatments Medical treatments for pain management include: Nerve block injections. These may include a pain blocker and anti-inflammatory medicines. You may have injections: Near the spine to relieve chronic back or neck pain. Into joints to relieve back or joint pain. Into nerve areas that supply a painful area to relieve body pain. Into muscles (trigger point injections) to relieve some painful muscle conditions. A medical device placed near your spine to help block pain signals and relieve nerve pain or chronic back pain (spinal cord  stimulation device). Acupuncture. Follow these instructions at home Medicines Take over-the-counter and prescription medicines only as told by your health care provider. If you are taking pain medicine, ask your health care providers about possible side effects to watch out for. Do not drive or use heavy machinery while taking prescription opioid pain medicine. Lifestyle  Do not use drugs or alcohol to reduce pain. If you drink alcohol, limit how much you have to: 0-1 drink a day for women who are not pregnant. 0-2 drinks a day for men. Know how much alcohol is in a drink. In the [place] one drink equals one 12 oz bottle of beer (355 mL), one 5 oz glass of wine (148 mL), or one 1 oz glass of hard liquor (44 mL). Do not use any products that contain nicotine or tobacco. These products include cigarettes, chewing tobacco, and vaping devices, such as e-cigarettes. If you need help quitting, ask your health care provider. Eat a healthy diet and maintain a healthy weight. Poor diet and excess weight may make pain worse. Eat foods that are high in fiber. These include fresh fruits and vegetables, whole grains, and beans. Limit foods that are high in fat and processed sugars, such as fried and sweet foods. Exercise regularly. Exercise lowers stress and may help relieve pain. Ask your health care provider what  activities and exercises are safe for you. If your health care provider approves, join an exercise class that combines movement and stress reduction. Examples include yoga and tai chi. Get enough sleep. Lack of sleep may make pain worse. Lower stress as much as possible. Practice stress reduction techniques as told by your therapist. General instructions Work with all your pain management providers to find the treatments that work best for you. You are an important member of your pain management team. There are many things you can do to reduce pain on your own. Consider joining an online or  in-person support group for people who have chronic pain. Keep all follow-up visits. This is important. Where to find more information You can find more information about managing pain without opioids from: American Academy of Pain Medicine: painmed.org Institute for Chronic Pain: instituteforchronicpain.org American Chronic Pain Association: theacpa.org Contact a health care provider if: You have side effects from pain medicine. Your pain gets worse or does not get better with treatments or home therapy. You are struggling with anxiety or depression. Summary Many types of pain can be managed without opioids. Chronic pain may respond better to pain management without opioids. Pain is best managed when you and a team of health care providers work together. Pain management without opioids may include non-opioid medicines, medical treatments, physical therapy, mental health therapy, and lifestyle changes. Tell your health care providers if your pain gets worse or is not being managed well enough. This information is not intended to replace advice given to you by your health care provider. Make sure you discuss any questions you have with your health care provider. Document Revised: 12/04/2020 Document Reviewed: 12/04/2020  Elsevier Patient Education  2024 [employer].

## 2024-04-25 ENCOUNTER — Emergency Department
Admission: EM | Admit: 2024-04-25 | Discharge: 2024-04-25 | Disposition: A | Discharge status: Home / Self Care | Source: Ambulatory Visit | Attending: Emergency Medicine | Admitting: Emergency Medicine

## 2024-04-25 ENCOUNTER — Other Ambulatory Visit: Payer: Self-pay

## 2024-04-25 ENCOUNTER — Emergency Department

## 2024-04-25 ENCOUNTER — Ambulatory Visit
Admission: RE | Admit: 2024-04-25 | Discharge: 2024-04-25 | Payer: Self-pay | Source: Ambulatory Visit | Attending: Emergency Medicine | Admitting: Emergency Medicine

## 2024-04-25 VITALS — BP 113/98 | HR 82 | Temp 98.5°F | Resp 16 | Wt 132.0 lb

## 2024-04-25 DIAGNOSIS — R519 Headache, unspecified: Secondary | ICD-10-CM

## 2024-04-25 DIAGNOSIS — R0602 Shortness of breath: Secondary | ICD-10-CM | POA: Diagnosis not present

## 2024-04-25 DIAGNOSIS — N189 Chronic kidney disease, unspecified: Secondary | ICD-10-CM | POA: Insufficient documentation

## 2024-04-25 DIAGNOSIS — I129 Hypertensive chronic kidney disease with stage 1 through stage 4 chronic kidney disease, or unspecified chronic kidney disease: Secondary | ICD-10-CM | POA: Diagnosis not present

## 2024-04-25 DIAGNOSIS — J0100 Acute maxillary sinusitis, unspecified: Secondary | ICD-10-CM | POA: Diagnosis not present

## 2024-04-25 DIAGNOSIS — G44209 Tension-type headache, unspecified, not intractable: Secondary | ICD-10-CM | POA: Diagnosis not present

## 2024-04-25 DIAGNOSIS — I6782 Cerebral ischemia: Secondary | ICD-10-CM | POA: Diagnosis not present

## 2024-04-25 DIAGNOSIS — E039 Hypothyroidism, unspecified: Secondary | ICD-10-CM | POA: Insufficient documentation

## 2024-04-25 MED ORDER — TIZANIDINE HCL 2 MG PO TABS
2.0000 mg | ORAL_TABLET | Freq: Every day | ORAL | 0 refills | Status: AC
Start: 2024-04-25 — End: 2024-05-25

## 2024-04-25 MED ORDER — DOXYCYCLINE HYCLATE 100 MG PO TABS: 100.0000 mg | ORAL_TABLET | Freq: Two times a day (BID) | ORAL | 0 refills | Status: DC

## 2024-04-25 NOTE — Discharge Instructions (Signed)
 I am sending you to the emergency department because of the new shortness of breath that has been going on for a week.  This could be an issue with your heart or your lungs.  You also have a new headache that is getting worse that is so severe that it kept you up all last night.  This could be early shingles, but it is impossible to diagnose this until rash shows up.  Your EKG was normal.  Please let them know if your headache or shortness of breath get worse.

## 2024-04-25 NOTE — ED Notes (Signed)
 Patient is being discharged from the Urgent Care and sent to the Emergency Department via POV . Per Dr.Mortenson, patient is in need of higher level of care due to SOB. Patient is aware and verbalizes understanding of plan of care.  Vitals:   04/25/24 1357  BP: (!) 113/98  Pulse: 82  Resp: 16  Temp: 98.5 F (36.9 C)  SpO2: 100%

## 2024-04-25 NOTE — ED Triage Notes (Addendum)
 Pt sts that she has been having a Killer headache on the right side. Pt sts that is pulsates. Pt advises that the pain comes and goes. Pt sts that she is not on any blood thinners.

## 2024-04-25 NOTE — ED Triage Notes (Signed)
 Pt c/o a pain near her right eye in her sinuses that has moved to the right side of her head. She describes the pain as a pulsating sharp pain. Pt denies any visual changes, light sensitivity, dizziness or chest pain. She does have some nausea.   Pt has taken tylenol  for the pain that has helped.

## 2024-04-25 NOTE — ED Provider Notes (Signed)
 [facility] Emergency Department Provider Note     Event Date/Time   First MD Initiated Contact with Patient 04/25/24 1706     (approximate)   History   Headache   HPI  Kim Santiago is a 89 y.o. female with a history of HTN, TMJ, osteoporosis, hypothyroidism, CKD, and Sjogren's disease, presents to the ED for evaluation of a headache.  Patient describes pain to the posterior occiput primarily on the right side of her head.  She describes the onset over the last 2 days.  The symptoms are fleeting in nature and sharp and spasmy by her report.  Patient denies any nausea, vomiting, vision loss, hearing loss, vertigo, tinnitus.  She also denies any facial weakness, facial droop, paralysis, slurred speech.  Patient would also report symptoms that she thought were related to a sinus infection, describing right-sided facial pressure and fullness primarily around the eye and midface.  She has had some purulent nasal drainage as well.  She was evaluated at a local urgent care today, and due to her reported headache symptoms, was advised to report to the ED for further evaluation.  Patient denies any recent injury, trauma, or falls.  No recent dental work reported.  She had previously been managing her TMJ with tizanidine , but has not taken her medication recently.   Physical Exam   Triage Vital Signs: ED Triage Vitals  Encounter Vitals Group     BP 04/25/24 1620 (!) 143/77     Girls Systolic BP Percentile --      Girls Diastolic BP Percentile --      Boys Systolic BP Percentile --      Boys Diastolic BP Percentile --      Pulse Rate 04/25/24 1620 98     Resp 04/25/24 1620 17     Temp 04/25/24 1620 98 F (36.7 C)     Temp Source 04/25/24 1620 Oral     SpO2 04/25/24 1620 98 %     Weight 04/25/24 1621 132 lb (59.9 kg)     Height 04/25/24 1621 5' 3 (1.6 m)     Head Circumference --      Peak Flow --      Pain Score 04/25/24 1621 6     Pain Loc --      Pain  Education --      Exclude from Growth Chart --     Most recent vital signs: Vitals:   04/25/24 1620  BP: (!) 143/77  Pulse: 98  Resp: 17  Temp: 98 F (36.7 C)  SpO2: 98%    General Awake, no distress. NAD A&O x 4 HEENT NCAT.  No tenderness to palpation over the left temple.  PERRL. EOMI. No rhinorrhea. Mucous membranes are moist.  TMs intact bilaterally without evidence of serous or purulent effusion. CV:  Good peripheral perfusion. RRR RESP:  Normal effort. CTA ABD:  No distention.  MSK:  AROM of all extremities.  Neck is supple with active range of motion.  No crepitus or rigidity noted. NEURO: Cranial nerves II to XII grossly intact.   ED Results / Procedures / Treatments   Labs (all labs ordered are listed, but only abnormal results are displayed) Labs Reviewed - No data to display   EKG   RADIOLOGY  I personally viewed and evaluated these images as part of my medical decision making, as well as reviewing the written report by the radiologist.  ED Provider Interpretation: No acute findings  CT Head Wo Contrast Result Date: 04/25/2024 CLINICAL DATA:  Poss a tile right-sided migraine headache EXAM: CT HEAD WITHOUT CONTRAST TECHNIQUE: Contiguous axial images were obtained from the base of the skull through the vertex without intravenous contrast. RADIATION DOSE REDUCTION: This exam was performed according to the departmental dose-optimization program which includes automated exposure control, adjustment of the mA and/or kV according to patient size and/or use of iterative reconstruction technique. COMPARISON:  11/07/2018 FINDINGS: Brain: Progressive hypodensities throughout the periventricular and subcortical white matter most consistent with chronic small vessel ischemic changes. No evidence of acute infarct or hemorrhage. Lateral ventricles and midline structures are unremarkable. No acute extra-axial fluid collections. No mass effect. Vascular: No hyperdense vessel or  unexpected calcification. Skull: Normal. Negative for fracture or focal lesion. Sinuses/Orbits: No acute finding. Other: None. IMPRESSION: 1. No acute intracranial process. 2. Progressive chronic small-vessel ischemic changes throughout the white matter. Electronically Signed   By: Ozell Daring M.D.   On: 04/25/2024 16:43     PROCEDURES:  Critical Care performed: No  Procedures   MEDICATIONS ORDERED IN ED: Medications - No data to display   IMPRESSION / MDM / ASSESSMENT AND PLAN / ED COURSE  I reviewed the triage vital signs and the nursing notes.                              Differential diagnosis includes, but is not limited to, tension headache, TMJ, temporal arteritis, SDH, ICH  Patient's presentation is most consistent with acute complicated illness / injury requiring diagnostic workup.  Patient's diagnosis is consistent with acute nonrecurrent maxillary sinusitis and non-intractable tension type headache.  Patient presents to the ED for 2 days of posterior fleeting head pain, and symptoms concerning for a sinusitis for the last 2 weeks.  Reassuring exam and workup overall.  Low concern for acute CVA as patient has had no facial droop, weakness, paralysis.  Symptoms not consistent with temporal arteritis, as her pain is to the posterior right occipital parietal region.  No evidence of Bell's palsy or facial nerve involvement as patient has no unilateral facial weakness.  CT images interpreted by me, negative for any acute intracranial process.  Her headache syndrome may also be related to her chronic TMJ which is currently not being managed.  Her presentation seems to likely be a musculoskeletal etiology therefore recent concern for possible tension type headache symptomology.   The patient is overall reassured by her negative workup at this time.  She will be discharged home with prescriptions for doxycycline  and tizanidine  to manage her sinusitis and headache pain, respectively.  Patient is to follow up with her PCP as suggested, as needed or otherwise directed. Patient is given ED precautions to return to the ED for any worsening or new symptoms.   FINAL CLINICAL IMPRESSION(S) / ED DIAGNOSES   Final diagnoses:  Acute non-recurrent maxillary sinusitis  Acute non intractable tension-type headache     Rx / DC Orders   ED Discharge Orders          Ordered    tiZANidine  (ZANAFLEX ) 2 MG tablet  Daily at bedtime        04/25/24 1758    doxycycline  (VIBRA -TABS) 100 MG tablet  2 times daily        04/25/24 1758             Note:  This document was prepared using Dragon voice recognition software and may include  unintentional dictation errors.    Loyd Candida LULLA Aldona, PA-C 04/25/24 2312    Bradler, Evan K, MD 04/26/24 [phone]

## 2024-04-25 NOTE — Discharge Instructions (Signed)
 Take the prescription meds as directed. Use OTC saline mist to moisturize the nose. Use the OTC Afrin to manage active nosebleed. Follow-up with your primary provider as needed.

## 2024-04-25 NOTE — ED Provider Notes (Signed)
 HPI  SUBJECTIVE:  Kim Santiago is a 89 y.o. female who presents with 2 issues. First, she presents with 2 days of right sinus pain and pressure, yellow-brown rhinorrhea, 3 episodes of right-sided epistaxis and now sharp, recurrent, seconds long right scalp pain.  She states that this scalp pain is getting worse and could not sleep last night because of the pain.  No fevers, facial swelling, upper dental pain, photophobia, blurry vision, double vision, visual loss, ear pain, change in hearing.  She wears hearing aids.  She reports nausea, but no vomiting.  No slurred speech, facial droop, unilateral numbness/tingling/weakness in her face/arm/leg.  No change in her baseline discoordination which is on the right.  No neck stiffness, rash.  No paresthesias in the right V1 dermatome.  She has never had headaches like this before.  States that she does not typically get headaches.  She tried Tylenol  1000 mg with temporary improvement in her symptoms.  Symptoms are worse with palpation of the scalp, and sleeping on her right side.  No sudden onset.  Second, she reports shortness of breath and dyspnea on exertion for the past week.  No chest pain, pressure, heaviness, palpitations, new lower extremity edema, nocturia, unintentional weight gain, PND, orthopnea, coughing, wheezing.  Symptoms are better with resting and worse with exertion.  She has not tried anything for her symptoms.  She has a past medical history of breast cancer, thyroid  cancer, hypertension, MI, hypothyroidism, lupus on methotrexate  and prednisone , left-sided CVA with residual right-sided balance and motor deficits, recurrent UTI.  Not sure if she had varicella as a child.  No history of pulmonary disease, CHF, arrhythmia, migraines. Additional history obtained from family member.  Past Medical History:  Diagnosis Date   Arthritis    Breast cancer (HCC) 2017   left mastectomy done 11/2015   Breast cancer in female [facility]) 11/18/2015    Left: 3.9 cm tumor, T2, 1/2 sentinel nodes positive for macro metastatic disease, N1, 3 negative nodes in the axillary tail, ER+,PR+, Her 2 neu, low Mammoprint score   Cancer (HCC)    thyroid  takes levothyroxine    CAP (community acquired pneumonia) 05/05/2021   Chronic kidney disease    UTI   Genetic screening 11/2015   Mammoprint of left breast cancer: Low risk for recurrence.    History of heart attack 06/12/2014   Hypertension    hypothyroidism    secondary to thyroidectomy for thyroid  ca   Hypothyroidism    Lupus    subcutaneous   Menopause 40s   natural, hot flashes and mood lability now gone, off prempro 7 months   Myocardial infarction [facility]) 2013   Osteoporosis    Osteopenia   Rosacea    Sjoegren syndrome    Stress-induced cardiomyopathy September of 2013   EF 35%. Peak troponin was 1.8.   Stroke [facility]) 10/2018    Past Surgical History:  Procedure Laterality Date   BACK SURGERY     BREAST BIOPSY Left 10/30/15   positive, done in Dr. Fredirick office   CARDIAC CATHETERIZATION  05/2012   [facility]. No significant CAD. Ejection fraction of 35% due to stress-induced cardiomyopathy.   CHOLECYSTECTOMY     COLONOSCOPY     DILATION AND CURETTAGE OF UTERUS     KYPHOSIS SURGERY  Feb 2008   L1, Dr. Kathi   LUMBAR DISC SURGERY     L4-L5   MASTECTOMY Left 11/18/2015   positive   SENTINEL NODE BIOPSY Left 11/18/2015   Procedure: [employer]  NODE BIOPSY;  Surgeon: Reyes LELON Cota, MD;  Location: [facility] ORS;  Service: General;  Laterality: Left;   SHOULDER ARTHROSCOPY  2004   Left, Dr. Maryl   SIMPLE MASTECTOMY WITH AXILLARY SENTINEL NODE BIOPSY Left 11/18/2015   Procedure: SIMPLE MASTECTOMY;  Surgeon: Reyes LELON Cota, MD;  Location: [facility] ORS;  Service: General;  Laterality: Left;   SPINE SURGERY     L4-5 diskectomy   THYROIDECTOMY     Thyroid  Cancer   TONSILLECTOMY     TUBAL LIGATION      Family History  Problem Relation Age of Onset   Kidney disease Mother    Heart  disease Mother    COPD Father    Cancer Father        esophageal   Kidney disease Sister    Cancer Brother 76       colon cancer (both brothers)   Cancer Brother    Heart attack Brother 40   Heart disease Brother    Breast cancer Neg Hx     Social History   Tobacco Use   Smoking status: Never   Smokeless tobacco: Never  Vaping Use   Vaping status: Never Used  Substance Use Topics   Alcohol use: No   Drug use: No    No current facility-administered medications for this encounter.  Current Outpatient Medications:    atorvastatin  (LIPITOR) 20 MG tablet, TAKE 1 TABLET BY MOUTH EVERY DAY AT 6 PM, Disp: 90 tablet, Rfl: 3   Calcium  Carbonate-Vitamin D  600-200 MG-UNIT TABS, Take 2 tablets by mouth daily., Disp: , Rfl:    cholecalciferol  (VITAMIN D ) 1000 UNITS tablet, Take 2,000 Units by mouth daily. (Patient taking differently: Take 2,000 Units by mouth daily.), Disp: , Rfl:    CRANBERRY PO, Take 1 capsule by mouth 2 (two) times daily., Disp: , Rfl:    fluticasone (FLONASE) 50 MCG/ACT nasal spray, Place 1 spray into both nostrils daily. (Patient taking differently: Place 1 spray into both nostrils daily as needed.), Disp: , Rfl:    folic acid  (FOLVITE ) 1 MG tablet, Take 2 mg by mouth daily., Disp: , Rfl:    furosemide  (LASIX ) 20 MG tablet, TAKE 1 TABLET BY MOUTH EVERY OTHER DAY AS NEEDED, Disp: 45 tablet, Rfl: 2   levothyroxine  (SYNTHROID ) 75 MCG tablet, TAKE 1 TABLET BY MOUTH (75MCG TOTAL) DAILY, Disp: 90 tablet, Rfl: 2   losartan  (COZAAR ) 25 MG tablet, Take 1 tablet (25 mg total) by mouth daily., Disp: 90 tablet, Rfl: 1   meloxicam  (MOBIC ) 15 MG tablet, TAKE 1 TABLET (15 MG TOTAL) BY MOUTH DAILY. (Patient not taking: Reported on 04/18/2024), Disp: 90 tablet, Rfl: 1   methotrexate  (RHEUMATREX) 2.5 MG tablet, Take 15 mg by mouth once a week., Disp: , Rfl:    metoprolol  succinate (TOPROL -XL) 25 MG 24 hr tablet, TAKE 1 TABLET BY MOUTH EVERY DAY, Disp: 90 tablet, Rfl: 3   metroNIDAZOLE  (METROGEL) 0.75 % gel, Apply topically 2 (two) times daily. (Patient not taking: Reported on 04/18/2024), Disp: , Rfl:    omeprazole  (PRILOSEC) 20 MG capsule, TAKE 1 CAPSULE (20 MG TOTAL) BY MOUTH 2 (TWO) TIMES DAILY BEFORE A MEAL. (Patient taking differently: Take 20 mg by mouth 2 (two) times daily as needed.), Disp: 180 capsule, Rfl: 1   ondansetron  (ZOFRAN ) 4 MG tablet, Take 1 tablet (4 mg total) by mouth every 6 (six) hours., Disp: 20 tablet, Rfl: 0   polyethylene glycol (MIRALAX  / GLYCOLAX ) packet, Take 17 g by mouth  daily., Disp: , Rfl:    Probiotic Product (PROBIOTIC PO), Take 1 tablet by mouth daily., Disp: , Rfl:    sertraline  (ZOLOFT ) 25 MG tablet, TAKE 1 TABLET (25 MG TOTAL) BY MOUTH DAILY., Disp: 90 tablet, Rfl: 1   SODIUM FLUORIDE 5000 PPM 1.1 % PSTE, See admin instructions., Disp: , Rfl:    tiZANidine  (ZANAFLEX ) 4 MG tablet, Take 1 tablet (4 mg total) by mouth every 6 (six) hours as needed for muscle spasms., Disp: 30 tablet, Rfl: 0   traMADol  (ULTRAM ) 50 MG tablet, Take 1 tablet (50 mg total) by mouth every 6 (six) hours as needed for up to 7 days., Disp: 28 tablet, Rfl: 0   traMADol  (ULTRAM ) 50 MG tablet, Take 1 tablet (50 mg total) by mouth every 12 (twelve) hours as needed., Disp: 60 tablet, Rfl: 5   triamcinolone  cream (KENALOG ) 0.1 %, APPLY TO AFFECTED AREA TWICE A DAY, Disp: , Rfl:   Allergies  Allergen Reactions   Naprosyn [Naproxen] Swelling   Amoxicillin  Rash    Tolerates first and third generation cephalosporins   Azathioprine  Nausea And Vomiting    Severe vomiting   Codeine Nausea And Vomiting   Hydroxychloroquine Hives and Nausea And Vomiting   Mycophenolate Mofetil Nausea Only   Orudis [Ketoprofen] Hives   Sulfa Antibiotics Rash   Sulfathiazole Rash     ROS  As noted in HPI.   Physical Exam  BP (!) 113/98 (BP Location: Right Arm)   Pulse 82   Temp 98.5 F (36.9 C) (Oral)   Resp 16   Wt 59.9 kg   SpO2 100%   BMI 23.38 kg/m   Constitutional: Well  developed, well nourished, no acute distress Eyes: PERRL, EOMI, conjunctiva normal bilaterally.  No photophobia HENT: Normocephalic, atraumatic,mucus membranes moist, normal dentition.  TM normal b/l. No TMJ tenderness. No nasal congestion.  Normal turbinates.  Positive right maxillary sinus tenderness.  Tenderness over the right scalp in the V1 distribution.  No temporal artery tenderness.  Neck: no cervical LN.  No trapezial muscle tenderness. No meningismus Respiratory: normal inspiratory effort, lungs clear bilaterally Cardiovascular: Normal rate, regular rhythm GI:  nondistended skin: No rash over the scalp or face, skin intact Musculoskeletal: No edema, no tenderness, no deformities Neurologic: Alert & oriented x 3, CN III-XII intact, finger-> nose, heel-> shin equal b/l, Romberg neg, difficulty with tandem gait, but this is baseline. Psychiatric: Speech and behavior appropriate   ED Course   Medications - No data to display  Orders Placed This Encounter  Procedures   ED EKG    Standing Status:   Standing    Number of Occurrences:   1    Reason for Exam:   Chest Pain   No results found for this or any previous visit (from the past 24 hours). No results found.   ED Clinical Impression  1. Shortness of breath   2. Acute nonintractable headache, unspecified headache type     ED Assessment/Plan       1.  Headache.  Could be a sinusitis with the sinus tenderness and recent epistaxis.  But I would not expect that to cause scalp pain or tenderness.  I wonder if she has an incoming shingles outbreak.  She is neurologically at her baseline.  But given her complicated medical history, transferring to the emergency department for further evaluation.  2.  Shortness of breath/dyspnea on exertion.  EKG is normal.  The differential is vast, including but not limited to ACS,  CHF, pneumonia, pleural effusion, pericardial effusion, intermittent arrhythmia.  However, she has multiple  medical comorbidities, I feel that she would benefit from an ER evaluation to rule out acute cardiopulmonary issues.   EKG: Normal sinus rhythm, rate 70.  Normal axis, first-degree AV block.  No hypertrophy.  No ST-T wave changes.  She is stable to go by private vehicle.  Discussed rationale for transfer to the ED with patient and family member.  They have opted to go to [facility].  No orders of the defined types were placed in this encounter.   *This clinic note was created using Dragon dictation software. Therefore, there may be occasional mistakes despite careful proofreading.  ?    Van Knee, MD 04/25/24 1520

## 2024-05-09 DIAGNOSIS — R040 Epistaxis: Secondary | ICD-10-CM | POA: Diagnosis not present

## 2024-05-09 DIAGNOSIS — J0191 Acute recurrent sinusitis, unspecified: Secondary | ICD-10-CM | POA: Diagnosis not present

## 2024-05-11 ENCOUNTER — Ambulatory Visit (INDEPENDENT_AMBULATORY_CARE_PROVIDER_SITE_OTHER): Admitting: Physician Assistant

## 2024-05-11 VITALS — BP 146/70 | HR 70 | Ht 64.0 in | Wt 131.0 lb

## 2024-05-11 DIAGNOSIS — N390 Urinary tract infection, site not specified: Secondary | ICD-10-CM | POA: Diagnosis not present

## 2024-05-11 DIAGNOSIS — R300 Dysuria: Secondary | ICD-10-CM | POA: Diagnosis not present

## 2024-05-11 LAB — URINALYSIS, COMPLETE
Bilirubin, UA: NEGATIVE
Glucose, UA: NEGATIVE
Ketones, UA: NEGATIVE
Nitrite, UA: NEGATIVE
Specific Gravity, UA: 1.015 (ref 1.005–1.030)
Urobilinogen, Ur: 0.2 mg/dL (ref 0.2–1.0)
pH, UA: 6 (ref 5.0–7.5)

## 2024-05-11 LAB — MICROSCOPIC EXAMINATION: WBC, UA: >30 /HPF — AB (ref 0–5)

## 2024-05-11 LAB — BLADDER SCAN AMB NON-IMAGING

## 2024-05-11 MED ORDER — FOSFOMYCIN TROMETHAMINE 3 G PO PACK
3.0000 g | PACK | Freq: Once | ORAL | 0 refills | Status: AC
Start: 2024-05-11 — End: 2024-05-11

## 2024-05-11 NOTE — Progress Notes (Unsigned)
 05/11/2024 11:21 AM   Kim Santiago 04-23-1935 [id number]  CC: No chief complaint on file.  HPI: Kim Santiago is a 89 y.o. female with PMH Sjogren's disease, ER/PR positive breast cancer, and recurrent UTI who presents today for ***.   Today she reports ***  In-office UA today positive for ***; urine microscopy with *** WBCs/HPF, *** RBCs/HPF, and ***. PVR ***mL.  PMH: Past Medical History:  Diagnosis Date   Arthritis    Breast cancer (HCC) 2017   left mastectomy done 11/2015   Breast cancer in female [facility]) 11/18/2015   Left: 3.9 cm tumor, T2, 1/2 sentinel nodes positive for macro metastatic disease, N1, 3 negative nodes in the axillary tail, ER+,PR+, Her 2 neu, low Mammoprint score   Cancer (HCC)    thyroid  takes levothyroxine    CAP (community acquired pneumonia) 05/05/2021   Chronic kidney disease    UTI   Genetic screening 11/2015   Mammoprint of left breast cancer: Low risk for recurrence.    History of heart attack 06/12/2014   Hypertension    hypothyroidism    secondary to thyroidectomy for thyroid  ca   Hypothyroidism    Lupus    subcutaneous   Menopause 40s   natural, hot flashes and mood lability now gone, off prempro 7 months   Myocardial infarction [facility]) 2013   Osteoporosis    Osteopenia   Rosacea    Sjoegren syndrome    Stress-induced cardiomyopathy September of 2013   EF 35%. Peak troponin was 1.8.   Stroke [facility]) 10/2018    Surgical History: Past Surgical History:  Procedure Laterality Date   BACK SURGERY     BREAST BIOPSY Left 10/30/15   positive, done in Dr. Fredirick office   CARDIAC CATHETERIZATION  05/2012   [facility]. No significant CAD. Ejection fraction of 35% due to stress-induced cardiomyopathy.   CHOLECYSTECTOMY     COLONOSCOPY     DILATION AND CURETTAGE OF UTERUS     KYPHOSIS SURGERY  Feb 2008   L1, Dr. Kathi   LUMBAR DISC SURGERY     L4-L5   MASTECTOMY Left 11/18/2015   positive   SENTINEL NODE BIOPSY Left 11/18/2015    Procedure: SENTINEL NODE BIOPSY;  Surgeon: Reyes LELON Cota, MD;  Location: [facility] ORS;  Service: General;  Laterality: Left;   SHOULDER ARTHROSCOPY  2004   Left, Dr. Maryl   SIMPLE MASTECTOMY WITH AXILLARY SENTINEL NODE BIOPSY Left 11/18/2015   Procedure: SIMPLE MASTECTOMY;  Surgeon: Reyes LELON Cota, MD;  Location: [facility] ORS;  Service: General;  Laterality: Left;   SPINE SURGERY     L4-5 diskectomy   THYROIDECTOMY     Thyroid  Cancer   TONSILLECTOMY     TUBAL LIGATION      Home Medications:  Allergies as of 05/11/2024       Reactions   Naprosyn [naproxen] Swelling   Amoxicillin  Rash   Tolerates first and third generation cephalosporins   Azathioprine  Nausea And Vomiting   Severe vomiting   Codeine Nausea And Vomiting   Hydroxychloroquine Hives, Nausea And Vomiting   Mycophenolate Mofetil Nausea Only   Orudis [ketoprofen] Hives   Sulfa Antibiotics Rash   Sulfathiazole Rash        Medication List        Accurate as of May 11, 2024 11:21 AM. If you have any questions, ask your nurse or doctor.          atorvastatin  20 MG tablet Commonly known as: LIPITOR TAKE  1 TABLET BY MOUTH EVERY DAY AT 6 PM   Calcium  Carbonate-Vitamin D  600-200 MG-UNIT Tabs Take 2 tablets by mouth daily.   cholecalciferol  1000 units tablet Commonly known as: VITAMIN D  Take 2,000 Units by mouth daily.   CRANBERRY PO Take 1 capsule by mouth 2 (two) times daily.   doxycycline  100 MG tablet Commonly known as: VIBRA -TABS Take 1 tablet (100 mg total) by mouth 2 (two) times daily.   fluticasone 50 MCG/ACT nasal spray Commonly known as: FLONASE Place 1 spray into both nostrils daily. What changed:  when to take this reasons to take this   folic acid  1 MG tablet Commonly known as: FOLVITE  Take 2 mg by mouth daily.   furosemide  20 MG tablet Commonly known as: LASIX  TAKE 1 TABLET BY MOUTH EVERY OTHER DAY AS NEEDED   levothyroxine  75 MCG tablet Commonly known as: SYNTHROID  TAKE 1  TABLET BY MOUTH (75MCG TOTAL) DAILY   losartan  25 MG tablet Commonly known as: COZAAR  Take 1 tablet (25 mg total) by mouth daily.   methotrexate  2.5 MG tablet Commonly known as: RHEUMATREX Take 15 mg by mouth once a week.   metoprolol  succinate 25 MG 24 hr tablet Commonly known as: TOPROL -XL TAKE 1 TABLET BY MOUTH EVERY DAY   metroNIDAZOLE 0.75 % gel Commonly known as: METROGEL Apply topically 2 (two) times daily.   omeprazole  20 MG capsule Commonly known as: PRILOSEC TAKE 1 CAPSULE (20 MG TOTAL) BY MOUTH 2 (TWO) TIMES DAILY BEFORE A MEAL. What changed:  when to take this reasons to take this   ondansetron  4 MG tablet Commonly known as: ZOFRAN  Take 1 tablet (4 mg total) by mouth every 6 (six) hours.   polyethylene glycol 17 g packet Commonly known as: MIRALAX  / GLYCOLAX  Take 17 g by mouth daily.   PROBIOTIC PO Take 1 tablet by mouth daily.   sertraline  25 MG tablet Commonly known as: ZOLOFT  TAKE 1 TABLET (25 MG TOTAL) BY MOUTH DAILY.   Sodium Fluoride 5000 PPM 1.1 % Pste Generic drug: Sodium Fluoride See admin instructions.   tiZANidine  2 MG tablet Commonly known as: ZANAFLEX  Take 1 tablet (2 mg total) by mouth at bedtime.   triamcinolone  cream 0.1 % Commonly known as: KENALOG  APPLY TO AFFECTED AREA TWICE A DAY        Allergies:  Allergies  Allergen Reactions   Naprosyn [Naproxen] Swelling   Amoxicillin  Rash    Tolerates first and third generation cephalosporins   Azathioprine  Nausea And Vomiting    Severe vomiting   Codeine Nausea And Vomiting   Hydroxychloroquine Hives and Nausea And Vomiting   Mycophenolate Mofetil Nausea Only   Orudis [Ketoprofen] Hives   Sulfa Antibiotics Rash   Sulfathiazole Rash    Family History: Family History  Problem Relation Age of Onset   Kidney disease Mother    Heart disease Mother    COPD Father    Cancer Father        esophageal   Kidney disease Sister    Cancer Brother 72       colon cancer (both  brothers)   Cancer Brother    Heart attack Brother 14   Heart disease Brother    Breast cancer Neg Hx     Social History:   reports that she has never smoked. She has never used smokeless tobacco. She reports that she does not drink alcohol and does not use drugs.  Physical Exam: There were no vitals taken for this visit.  Constitutional:  Alert and oriented, no acute distress, nontoxic appearing HEENT: [place], AT Cardiovascular: No clubbing, cyanosis, or edema Respiratory: Normal respiratory effort, no increased work of breathing GI: Abdomen is soft, nontender, nondistended, no abdominal masses GU: No CVA tenderness Lymph: No cervical or inguinal lymphadenopathy Skin: No rashes, bruises or suspicious lesions Neurologic: Grossly intact, no focal deficits, moving all 4 extremities Psychiatric: Normal mood and affect  Laboratory Data: Lab Results  Component Value Date   WBC 4.3 06/02/2023   HGB 10.5 (L) 06/02/2023   HCT 30.5 (L) 06/02/2023   MCV 115.9 Repeated and verified X2. (H) 06/02/2023   PLT 154.0 06/02/2023    Lab Results  Component Value Date   CREATININE 0.80 12/10/2023    CrCl cannot be calculated (Patient's most recent lab result is older than the maximum 21 days allowed.).  Results for orders placed or performed in visit on 12/10/23  Iron , TIBC and Ferritin Panel   Collection Time: 12/10/23  4:30 PM  Result Value Ref Range   Iron  45 45 - 160 mcg/dL   TIBC 764 (L) 749 - 549 mcg/dL (calc)   %SAT 19 16 - 45 % (calc)   Ferritin 369 (H) 16 - 288 ng/mL  Comprehensive metabolic panel with GFR   Collection Time: 12/10/23  4:30 PM  Result Value Ref Range   Glucose, Bld 122 (H) 65 - 99 mg/dL   BUN 21 7 - 25 mg/dL   Creat 9.19 9.39 - 9.04 mg/dL   eGFR 72 > OR = 60 fO/fpw/8.26f7   BUN/Creatinine Ratio SEE NOTE: 6 - 22 (calc)   Sodium 137 135 - 146 mmol/L   Potassium 4.3 3.5 - 5.3 mmol/L   Chloride 101 98 - 110 mmol/L   CO2 28 20 - 32 mmol/L   Calcium  8.5 (L) 8.6  - 10.4 mg/dL   Total Protein 5.8 (L) 6.1 - 8.1 g/dL   Albumin 3.8 3.6 - 5.1 g/dL   Globulin 2.0 1.9 - 3.7 g/dL (calc)   AG Ratio 1.9 1.0 - 2.5 (calc)   Total Bilirubin 0.5 0.2 - 1.2 mg/dL   Alkaline phosphatase (APISO) 75 37 - 153 U/L   AST 14 10 - 35 U/L   ALT 7 6 - 29 U/L  TSH   Collection Time: 12/10/23  4:30 PM  Result Value Ref Range   TSH 1.76 0.40 - 4.50 mIU/L    Pertinent Imaging: KUB, ***: *** Results for orders placed during the hospital encounter of 08/27/23  Abdomen 1 view (KUB)  Narrative CLINICAL DATA:  Right-sided flank pain for several days, initial encounter  EXAM: ABDOMEN - 1 VIEW  COMPARISON:  CT from 02/28/2018  FINDINGS: Scattered large and small bowel gas is noted. No obstructive changes are seen. No free air is noted. Kidneys are somewhat obscured by overlying bowel gas. No definitive renal or ureteral stones are seen. Changes of prior vertebral augmentation are identified.  IMPRESSION: No acute abnormality noted.   Electronically Signed By: Oneil Devonshire M.D. On: 09/13/2023 22:01  No results found for this or any previous visit.  No results found for this or any previous visit.  No results found for this or any previous visit.  No results found for this or any previous visit.  No results found for this or any previous visit.  No results found for this or any previous visit.  No results found for this or any previous visit.   I personally reviewed the images referenced above and note ***.  Assessment & Plan:   1. Recurrent UTI (Primary) *** - Urinalysis, Complete - Bladder Scan (Post Void Residual) in office - CULTURE, URINE COMPREHENSIVE  2. Dysuria *** - Urinalysis, Complete  3. Burning with urination *** - Urinalysis, Complete   No follow-ups on file.  Lucie Hones, PA-C  [facility] [place] [address], [address] [place], [place] [address] [phone]

## 2024-05-13 ENCOUNTER — Other Ambulatory Visit: Payer: Self-pay | Admitting: Internal Medicine

## 2024-05-16 ENCOUNTER — Ambulatory Visit: Payer: Self-pay | Admitting: Physician Assistant

## 2024-05-16 LAB — CULTURE, URINE COMPREHENSIVE

## 2024-05-24 NOTE — Telephone Encounter (Signed)
 Patient returned call to office and stated that her infection has cleared up and she feels great. Patient was advised that if she experiences any recurrent symptoms, she should call our office back to schedule an office visit. Patient expressed understanding.

## 2024-06-06 ENCOUNTER — Ambulatory Visit (INDEPENDENT_AMBULATORY_CARE_PROVIDER_SITE_OTHER): Admitting: Internal Medicine

## 2024-06-06 ENCOUNTER — Encounter: Payer: Self-pay | Admitting: Internal Medicine

## 2024-06-06 VITALS — BP 130/60 | HR 49 | Ht 64.0 in | Wt 134.4 lb

## 2024-06-06 DIAGNOSIS — G8929 Other chronic pain: Secondary | ICD-10-CM

## 2024-06-06 DIAGNOSIS — E032 Hypothyroidism due to medicaments and other exogenous substances: Secondary | ICD-10-CM | POA: Diagnosis not present

## 2024-06-06 DIAGNOSIS — M5441 Lumbago with sciatica, right side: Secondary | ICD-10-CM

## 2024-06-06 DIAGNOSIS — M5442 Lumbago with sciatica, left side: Secondary | ICD-10-CM | POA: Diagnosis not present

## 2024-06-06 DIAGNOSIS — R300 Dysuria: Secondary | ICD-10-CM

## 2024-06-06 DIAGNOSIS — I69359 Hemiplegia and hemiparesis following cerebral infarction affecting unspecified side: Secondary | ICD-10-CM | POA: Diagnosis not present

## 2024-06-06 DIAGNOSIS — M320 Drug-induced systemic lupus erythematosus: Secondary | ICD-10-CM | POA: Insufficient documentation

## 2024-06-06 DIAGNOSIS — E875 Hyperkalemia: Secondary | ICD-10-CM

## 2024-06-06 DIAGNOSIS — I5032 Chronic diastolic (congestive) heart failure: Secondary | ICD-10-CM | POA: Diagnosis not present

## 2024-06-06 DIAGNOSIS — N1831 Chronic kidney disease, stage 3a: Secondary | ICD-10-CM | POA: Diagnosis not present

## 2024-06-06 DIAGNOSIS — M5412 Radiculopathy, cervical region: Secondary | ICD-10-CM

## 2024-06-06 DIAGNOSIS — D638 Anemia in other chronic diseases classified elsewhere: Secondary | ICD-10-CM

## 2024-06-06 DIAGNOSIS — E782 Mixed hyperlipidemia: Secondary | ICD-10-CM | POA: Diagnosis not present

## 2024-06-06 DIAGNOSIS — M329 Systemic lupus erythematosus, unspecified: Secondary | ICD-10-CM | POA: Insufficient documentation

## 2024-06-06 DIAGNOSIS — I10 Essential (primary) hypertension: Secondary | ICD-10-CM | POA: Diagnosis not present

## 2024-06-06 DIAGNOSIS — R531 Weakness: Secondary | ICD-10-CM

## 2024-06-06 LAB — LIPID PANEL
Cholesterol: 86 mg/dL (ref 0–200)
HDL: 36.4 mg/dL — ABNORMAL LOW (ref >39.00)
LDL Cholesterol: 35 mg/dL (ref 0–99)
NonHDL: 49.1
Total CHOL/HDL Ratio: 2
Triglycerides: 71 mg/dL (ref 0.0–149.0)
VLDL: 14.2 mg/dL (ref 0.0–40.0)

## 2024-06-06 LAB — CBC WITH DIFFERENTIAL/PLATELET
Basophils Absolute: 0.1 K/uL (ref 0.0–0.1)
Basophils Relative: 2 % (ref 0.0–3.0)
Eosinophils Absolute: 0.1 K/uL (ref 0.0–0.7)
Eosinophils Relative: 2.3 % (ref 0.0–5.0)
HCT: 28.9 % — ABNORMAL LOW (ref 36.0–46.0)
Hemoglobin: 9.9 g/dL — ABNORMAL LOW (ref 12.0–15.0)
Lymphocytes Relative: 19.3 % (ref 12.0–46.0)
Lymphs Abs: 0.9 K/uL (ref 0.7–4.0)
MCHC: 34.2 g/dL (ref 30.0–36.0)
MCV: 108.9 fl — ABNORMAL HIGH (ref 78.0–100.0)
Monocytes Absolute: 0.1 K/uL (ref 0.1–1.0)
Monocytes Relative: 3 % (ref 3.0–12.0)
Neutro Abs: 3.2 K/uL (ref 1.4–7.7)
Neutrophils Relative %: 73.4 % (ref 43.0–77.0)
Platelets: 138 K/uL — ABNORMAL LOW (ref 150.0–400.0)
RBC: 2.66 Mil/uL — ABNORMAL LOW (ref 3.87–5.11)
RDW: 17.1 % — ABNORMAL HIGH (ref 11.5–15.5)
WBC: 4.4 K/uL (ref 4.0–10.5)

## 2024-06-06 LAB — LDL CHOLESTEROL, DIRECT: Direct LDL: 40 mg/dL

## 2024-06-06 LAB — COMPREHENSIVE METABOLIC PANEL WITH GFR
ALT: 5 U/L (ref 0–35)
AST: 16 U/L (ref 0–37)
Albumin: 3.8 g/dL (ref 3.5–5.2)
Alkaline Phosphatase: 60 U/L (ref 39–117)
BUN: 19 mg/dL (ref 6–23)
CO2: 31 meq/L (ref 19–32)
Calcium: 8.9 mg/dL (ref 8.4–10.5)
Chloride: 99 meq/L (ref 96–112)
Creatinine, Ser: 0.71 mg/dL (ref 0.40–1.20)
GFR: 76.45 mL/min (ref >60.00)
Glucose, Bld: 94 mg/dL (ref 70–99)
Potassium: 5.5 meq/L — ABNORMAL HIGH (ref 3.5–5.1)
Sodium: 135 meq/L (ref 135–145)
Total Bilirubin: 0.5 mg/dL (ref 0.2–1.2)
Total Protein: 5.8 g/dL — ABNORMAL LOW (ref 6.0–8.3)

## 2024-06-06 LAB — TSH: TSH: 1.58 u[IU]/mL (ref 0.35–5.50)

## 2024-06-06 MED ORDER — TIZANIDINE HCL 2 MG PO TABS: 2.0000 mg | ORAL_TABLET | Freq: Three times a day (TID) | ORAL | 0 refills | Status: DC | PRN

## 2024-06-06 NOTE — Assessment & Plan Note (Signed)
 LDL is  < 70 on current statin and lfts are mormal.  No changes today

## 2024-06-06 NOTE — Assessment & Plan Note (Signed)
 Mild, and stable, y due to chronic disease.  Iron  stores are normal  as are b12 and folate    Lab Results  Component Value Date   IRON  45 12/10/2023   TIBC 235 (L) 12/10/2023   FERRITIN 369 (H) 12/10/2023   Last vitamin B12 and Folate Lab Results  Component Value Date   VITAMINB12 865 06/02/2023   FOLATE >24.2 06/02/2023    Lab Results  Component Value Date   WBC 4.4 06/06/2024   HGB 9.9 (L) 06/06/2024   HCT 28.9 (L) 06/06/2024   MCV 108.9 (H) 06/06/2024   PLT 138.0 (L) 06/06/2024

## 2024-06-06 NOTE — Assessment & Plan Note (Signed)
 She has grown unsteady on her feet and notes worsening balance due to sedentary lifestyle . Home health PT was recommended but deferred.  She has been walking on a regular basis, weather permitting,  using a Rollator

## 2024-06-06 NOTE — Assessment & Plan Note (Signed)
.    Outlined treatment plan for pain management using tylenol , tramadol ,  Salon Pas patch and heating pad .  Outlined treatment plan for pain management using tylenol , tramadol ,  Salon Pas patch and heating pad

## 2024-06-06 NOTE — Patient Instructions (Signed)
 Please return at your leisure to have  the x ray s of your spine  I have prescribed a muscle relaxer to use for your neck pain    If you develop symptoms of  UTI,  please collect a urine sample in the sterile container and call the office to schedule a lab visit

## 2024-06-06 NOTE — Assessment & Plan Note (Signed)
 Managed with 25 mg  losartan  .and 25 mg metoprolol 

## 2024-06-06 NOTE — Assessment & Plan Note (Signed)
 Managed with every other day lasix  and aggresive BP control

## 2024-06-06 NOTE — Assessment & Plan Note (Signed)
 Right sided, vs occipital neuralgia.  Symptoms occurred in the setting of acute sinusitis and have resolved.  Head CT was negative for [facility] and masses in August 2025.   Plain films of cervical spine ordered.  Tylenol  and MR advised.

## 2024-06-06 NOTE — Progress Notes (Signed)
 Subjective:  Patient ID: Kim Santiago, female    DOB: 05-29-1935  Age: 89 y.o. MRN: [id number]  CC: The primary encounter diagnosis was Essential hypertension. Diagnoses of Iatrogenic hypothyroidism, Mixed hyperlipidemia, Anemia, chronic disease, Dysuria, Cervical radiculitis, Radiculitis, cervical, Drug-induced systemic lupus erythematosus, unspecified organ involvement status, Stage 3a chronic kidney disease (HCC), Chronic diastolic heart failure (HCC), Hemiplegia affecting dominant side, post-stroke (HCC), Chronic bilateral low back pain with bilateral sciatica, and Generalized weakness were also pertinent to this visit.   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   1) HLD; taking atorvastatin   2) cc:  a) she reports a  history of  stabbing right sided scalp pain that occurred every 15 seconds  in the setting of acute sinusitis.the pain lasted around a week in August.   No longer having the pain but  has occasional feeling  of soreness.pressure in the C3  distribution.  She was referred to the ED by Urgent Care and evaluated with a head CT which was negative for acute changes .  There was no history of vesicular rash, temple tenderness or vision changes.   She had an evaluation by ENT Juengel afterward and referred back to me.  She has no history of cervical spine imaging but has chronic left trapezius muscle pain which is aggravating by head turning    3)  recurrent symptoms of UTI:   none currently.    4) Deconditioning:  she has made a concerted effort to start walking again  daily , and uses a rollator for balance support   Outpatient Medications Prior to Visit  Medication Sig Dispense Refill   atorvastatin  (LIPITOR) 20 MG tablet TAKE 1 TABLET BY MOUTH EVERY DAY AT 6 PM 90 tablet 3   Calcium  Carbonate-Vitamin D  600-200 MG-UNIT TABS Take 2 tablets by mouth daily.     cholecalciferol  (VITAMIN D ) 1000 UNITS tablet Take 2,000 Units by mouth daily. (Patient taking differently: Take 2,000 Units by mouth daily.)     CRANBERRY PO Take 1 capsule by mouth 2 (two) times daily.     fluticasone (FLONASE) 50 MCG/ACT nasal spray Place 1 spray into both nostrils daily. (Patient taking differently: Place 1 spray into both nostrils daily as needed.)     folic acid  (FOLVITE ) 1 MG tablet Take 2 mg by mouth daily.     furosemide  (LASIX ) 20 MG tablet TAKE 1 TABLET BY MOUTH EVERY OTHER DAY AS NEEDED 45 tablet 2   levothyroxine  (SYNTHROID ) 75 MCG tablet TAKE 1 TABLET BY MOUTH (75MCG TOTAL) DAILY 90 tablet 2   losartan  (COZAAR ) 25 MG tablet TAKE 1 TABLET (25 MG TOTAL) BY MOUTH DAILY. 90 tablet 1   methotrexate  (RHEUMATREX) 2.5 MG tablet Take 15 mg by mouth once a week.     metoprolol  succinate (TOPROL -XL) 25 MG 24 hr tablet TAKE 1 TABLET BY MOUTH EVERY DAY 90 tablet 3   omeprazole  (PRILOSEC) 20 MG capsule TAKE 1 CAPSULE (20 MG TOTAL) BY MOUTH 2 (TWO) TIMES DAILY BEFORE A MEAL. (Patient taking differently: Take 20 mg by mouth 2 (  two) times daily as needed.) 180 capsule 1   ondansetron  (ZOFRAN ) 4 MG tablet Take 1 tablet (4 mg total) by mouth every 6 (six) hours. 20 tablet 0   polyethylene glycol (MIRALAX  / GLYCOLAX ) packet Take 17 g by mouth daily.     Probiotic Product (PROBIOTIC PO) Take 1 tablet by mouth daily.     sertraline  (ZOLOFT ) 25 MG tablet TAKE 1 TABLET (25 MG TOTAL) BY MOUTH DAILY. 90 tablet 1   SODIUM FLUORIDE 5000 PPM 1.1 % PSTE See admin instructions.     triamcinolone  cream (KENALOG ) 0.1 % APPLY TO AFFECTED AREA TWICE A DAY     doxycycline  (VIBRA -TABS) 100 MG tablet Take 1  tablet (100 mg total) by mouth 2 (two) times daily. 20 tablet 0   No facility-administered medications prior to visit.    Review of Systems;  Patient denies headache, fevers, malaise, unintentional weight loss, skin rash, eye pain, sinus congestion and sinus pain, sore throat, dysphagia,  hemoptysis , cough, dyspnea, wheezing, chest pain, palpitations, orthopnea, edema, abdominal pain, nausea, melena, diarrhea, constipation, flank pain, dysuria, hematuria, urinary  Frequency, nocturia, numbness, tingling, seizures,  Focal weakness, Loss of consciousness,  Tremor, insomnia, depression, anxiety, and suicidal ideation.      Objective:  BP 130/60   Pulse (!) 49   Ht 5' 4 (1.626 m)   Wt 134 lb 6.4 oz (61 kg)   SpO2 99%   BMI 23.07 kg/m   BP Readings from Last 3 Encounters:  06/06/24 130/60  05/11/24 (!) 146/70  04/25/24 (!) 143/77    Wt Readings from Last 3 Encounters:  06/06/24 134 lb 6.4 oz (61 kg)  05/11/24 131 lb (59.4 kg)  04/25/24 132 lb (59.9 kg)    Physical Exam Vitals reviewed.  Constitutional:      General: She is not in acute distress.    Appearance: Normal appearance. She is normal weight. She is not ill-appearing, toxic-appearing or diaphoretic.  HENT:     Head: Normocephalic.  Eyes:     General: No scleral icterus.       Right eye: No discharge.        Left eye: No discharge.     Conjunctiva/sclera: Conjunctivae normal.  Cardiovascular:     Rate and Rhythm: Normal rate and regular rhythm.     Heart sounds: Normal heart sounds.  Pulmonary:     Effort: Pulmonary effort is normal. No respiratory distress.     Breath sounds: Normal breath sounds.  Musculoskeletal:        General: Normal range of motion.  Skin:    General: Skin is warm and dry.     Findings: Rash present.     Comments: Purpuric macular rash covering extremities and turch   Neurological:     General: No focal deficit present.     Mental Status: She is alert and oriented to person, place,  and time. Mental status is at baseline.  Psychiatric:        Mood and Affect: Mood normal.        Behavior: Behavior normal.        Thought Content: Thought content normal.        Judgment: Judgment normal.     Lab Results  Component Value Date   HGBA1C 4.9 01/19/2023   HGBA1C 4.9 11/08/2018    Lab Results  Component Value Date   CREATININE 0.71 06/06/2024   CREATININE 0.80 12/10/2023   CREATININE 0.83 06/02/2023    Lab Results  Component Value Date   WBC 4.4 06/06/2024   HGB 9.9 (L) 06/06/2024   HCT 28.9 (L) 06/06/2024   PLT 138.0 (L) 06/06/2024   GLUCOSE 94 06/06/2024   CHOL 86 06/06/2024   TRIG 71.0 06/06/2024   HDL 36.40 (L) 06/06/2024   LDLDIRECT 40.0 06/06/2024   LDLCALC 35 06/06/2024   ALT 5 06/06/2024   AST 16 06/06/2024   NA 135 06/06/2024   K 5.5 No hemolysis seen (H) 06/06/2024   CL 99 06/06/2024   CREATININE 0.71 06/06/2024   BUN 19 06/06/2024   CO2 31 06/06/2024   TSH 1.58 06/06/2024   INR 0.9 04/21/2022   HGBA1C 4.9 01/19/2023    CT Head Wo Contrast Result Date: 04/25/2024 CLINICAL DATA:  Poss a tile right-sided migraine headache EXAM: CT HEAD WITHOUT CONTRAST TECHNIQUE: Contiguous axial images were obtained from the base of the skull through the vertex without intravenous contrast. RADIATION DOSE REDUCTION: This exam was performed according to the departmental dose-optimization program which includes automated exposure control, adjustment of the mA and/or kV according to patient size and/or use of iterative reconstruction technique. COMPARISON:  11/07/2018 FINDINGS: Brain: Progressive hypodensities throughout the periventricular and subcortical white matter most consistent with chronic small vessel ischemic changes. No evidence of acute infarct or hemorrhage. Lateral ventricles and midline structures are unremarkable. No acute extra-axial fluid collections. No mass effect. Vascular: No hyperdense vessel or unexpected calcification. Skull: Normal.  Negative for fracture or focal lesion. Sinuses/Orbits: No acute finding. Other: None. IMPRESSION: 1. No acute intracranial process. 2. Progressive chronic small-vessel ischemic changes throughout the white matter. Electronically Signed   By: Ozell Daring M.D.   On: 04/25/2024 16:43    Assessment & Plan:  .Essential hypertension Assessment & Plan: Managed with 25 mg  losartan  .and 25 mg metoprolol      Orders: -     Comprehensive metabolic panel with GFR  Iatrogenic hypothyroidism -     TSH  Mixed hyperlipidemia Assessment & Plan: LDL is  < 70 on current statin and lfts are mormal.  No changes today  Orders: -     Lipid panel -     LDL cholesterol, direct  Anemia, chronic disease Assessment & Plan: Mild, and stable, y due to chronic disease.  Iron  stores are normal  as are b12 and folate    Lab Results  Component Value Date   IRON  45 12/10/2023   TIBC 235 (L) 12/10/2023   FERRITIN 369 (H) 12/10/2023   Last vitamin B12 and Folate Lab Results  Component Value Date   VITAMINB12 865 06/02/2023   FOLATE >24.2 06/02/2023    Lab Results  Component Value Date   WBC 4.4 06/06/2024   HGB 9.9 (L) 06/06/2024   HCT 28.9 (L) 06/06/2024   MCV 108.9 (H) 06/06/2024   PLT 138.0 (L) 06/06/2024     Orders: -     CBC with Differential/Platelet  Dysuria -     Urinalysis, Routine w reflex microscopic; Future -     Urine Culture; Future  Cervical radiculitis -     DG Cervical Spine Complete; Future  Radiculitis, cervical Assessment & Plan: Right sided, vs occipital neuralgia.  Symptoms occurred in the setting of acute sinusitis and have resolved.  Head CT was negative for [facility] and masses in August 2025.   Plain films of cervical spine ordered.  Tylenol  and MR advised.    Drug-induced systemic lupus erythematosus, unspecified organ involvement status  Stage 3a chronic kidney disease (HCC)  Chronic diastolic heart failure (HCC) Assessment & Plan: Managed with every other  day lasix  and aggresive BP control    Hemiplegia affecting dominant side, post-stroke [facility]) Assessment & Plan: She has grown unsteady on her feet and notes worsening balance due to sedentary lifestyle . Home health PT was recommended but deferred.  She has been walking on a regular basis, weather permitting,  using a Rollator    Chronic bilateral low back pain with bilateral sciatica Assessment & Plan: .  Outlined treatment plan for pain management using tylenol , tramadol ,  Salon Pas patch and heating pad .  Outlined treatment plan for pain management using tylenol , tramadol ,  Salon Pas patch and heating pad    Generalized weakness Assessment & Plan: Improving gradually  encouraged to walk daily    Other orders -     tiZANidine  HCl; Take 1 tablet (2 mg total) by mouth every 8 (eight) hours as needed for muscle spasms.  Dispense: 30 tablet; Refill: 0   I personally spent a total of 34 minutes in the care of the patient today including preparing to see the patient, getting/reviewing separately obtained history, performing a medically appropriate exam/evaluation, counseling and educating, placing orders, documenting clinical information in the EHR, and independently interpreting results.   Follow-up: No follow-ups on file.   Verneita LITTIE Kettering, MD

## 2024-06-06 NOTE — Assessment & Plan Note (Signed)
 Improving gradually  encouraged to walk daily

## 2024-06-07 ENCOUNTER — Ambulatory Visit: Payer: Self-pay | Admitting: Internal Medicine

## 2024-06-07 NOTE — Addendum Note (Signed)
 Addended by: MARYLYNN VERNEITA CROME on: 06/07/2024 07:57 AM   Modules accepted: Orders

## 2024-06-08 ENCOUNTER — Other Ambulatory Visit: Payer: Self-pay | Admitting: *Deleted

## 2024-06-08 ENCOUNTER — Other Ambulatory Visit (INDEPENDENT_AMBULATORY_CARE_PROVIDER_SITE_OTHER)

## 2024-06-08 DIAGNOSIS — E875 Hyperkalemia: Secondary | ICD-10-CM

## 2024-06-08 LAB — POTASSIUM: Potassium: 5.3 mmol/L — ABNORMAL HIGH (ref 3.5–5.2)

## 2024-06-08 NOTE — Addendum Note (Signed)
 Addended by: MARYLEN PRO A on: 06/08/2024 12:31 PM   Modules accepted: Orders

## 2024-06-08 NOTE — Telephone Encounter (Signed)
 Copied from CRM #[id number]. Topic: General - Other >> Jun 08, 2024 11:07 AM Olam RAMAN wrote: Reason for CRM: Pt stated she missed a call just now for dr to have her go in today. [phone]

## 2024-06-09 ENCOUNTER — Ambulatory Visit: Admitting: Internal Medicine

## 2024-06-10 ENCOUNTER — Ambulatory Visit: Payer: Self-pay | Admitting: Internal Medicine

## 2024-06-10 NOTE — Addendum Note (Signed)
 Addended by: MARYLYNN VERNEITA CROME on: 06/10/2024 10:50 PM   Modules accepted: Orders

## 2024-06-12 ENCOUNTER — Other Ambulatory Visit

## 2024-06-13 ENCOUNTER — Telehealth: Payer: Self-pay

## 2024-06-13 NOTE — Telephone Encounter (Unsigned)
 Copied from CRM [id number]. Topic: General - Call Back - No Documentation >> Jun 13, 2024  2:49 PM Kim Santiago wrote: Reason for CRM: Patient is returning call. Callback number [phone] (that's her number, they had called her daughts number)

## 2024-06-13 NOTE — Telephone Encounter (Signed)
 Spoke with pt and scheduled her for a nurse visit to have her flu shot done and then go to the lab after that.

## 2024-06-13 NOTE — Telephone Encounter (Signed)
 LMTCB

## 2024-06-13 NOTE — Telephone Encounter (Signed)
 Copied from CRM [id number]. Topic: Appointments - Appointment Info/Confirmation >> Jun 13, 2024 11:18 AM Kim Santiago wrote: Patient/patient representative is calling for information regarding an appointment.   Patient called wanting to see if she can get her flu shot when she gets her labs done on 10/22 [phone]

## 2024-06-22 DIAGNOSIS — L931 Subacute cutaneous lupus erythematosus: Secondary | ICD-10-CM | POA: Diagnosis not present

## 2024-06-22 DIAGNOSIS — Z79899 Other long term (current) drug therapy: Secondary | ICD-10-CM | POA: Diagnosis not present

## 2024-06-28 ENCOUNTER — Other Ambulatory Visit

## 2024-06-28 ENCOUNTER — Ambulatory Visit (INDEPENDENT_AMBULATORY_CARE_PROVIDER_SITE_OTHER)

## 2024-06-28 DIAGNOSIS — E875 Hyperkalemia: Secondary | ICD-10-CM

## 2024-06-28 DIAGNOSIS — Z23 Encounter for immunization: Secondary | ICD-10-CM | POA: Diagnosis not present

## 2024-06-28 NOTE — Progress Notes (Deleted)
 Pt received High Dose Flu injection in right deltoid muscle. Pt tolerated it well with no complaints or concerns.

## 2024-06-28 NOTE — Progress Notes (Signed)
 Pt received High Dose Flu injection in right deltoid muscle. Pt tolerated it well with no complaints or concerns.

## 2024-06-29 LAB — BASIC METABOLIC PANEL WITH GFR
BUN: 24 mg/dL — ABNORMAL HIGH (ref 6–23)
CO2: 29 meq/L (ref 19–32)
Calcium: 8.5 mg/dL (ref 8.4–10.5)
Chloride: 97 meq/L (ref 96–112)
Creatinine, Ser: 0.75 mg/dL (ref 0.40–1.20)
GFR: 71.55 mL/min (ref >60.00)
Glucose, Bld: 104 mg/dL — ABNORMAL HIGH (ref 70–99)
Potassium: 5.2 meq/L — ABNORMAL HIGH (ref 3.5–5.1)
Sodium: 134 meq/L — ABNORMAL LOW (ref 135–145)

## 2024-07-02 ENCOUNTER — Other Ambulatory Visit: Payer: Self-pay | Admitting: Internal Medicine

## 2024-07-02 DIAGNOSIS — E875 Hyperkalemia: Secondary | ICD-10-CM | POA: Insufficient documentation

## 2024-07-02 NOTE — Addendum Note (Signed)
 Addended by: MARYLYNN VERNEITA CROME on: 07/02/2024 02:22 PM   Modules accepted: Orders

## 2024-07-02 NOTE — Assessment & Plan Note (Signed)
 Presumed secondary to ARB;  stopping medication.  Repeat 2 weeks

## 2024-07-03 NOTE — Telephone Encounter (Signed)
 noted

## 2024-07-03 NOTE — Telephone Encounter (Unsigned)
 Copied from CRM #[id number]. Topic: Clinical - Lab/Test Results >> Jul 03, 2024  3:01 PM Rea ORN wrote: Reason for CRM: Pt returned Jessica's call. Pt was advised of lab results and scheduled repeat lab appt.

## 2024-07-12 ENCOUNTER — Other Ambulatory Visit (INDEPENDENT_AMBULATORY_CARE_PROVIDER_SITE_OTHER)

## 2024-07-12 DIAGNOSIS — E875 Hyperkalemia: Secondary | ICD-10-CM | POA: Diagnosis not present

## 2024-07-13 LAB — BASIC METABOLIC PANEL WITH GFR
BUN: 20 mg/dL (ref 6–23)
CO2: 29 meq/L (ref 19–32)
Calcium: 8.8 mg/dL (ref 8.4–10.5)
Chloride: 98 meq/L (ref 96–112)
Creatinine, Ser: 0.75 mg/dL (ref 0.40–1.20)
GFR: 71.53 mL/min (ref >60.00)
Glucose, Bld: 97 mg/dL (ref 70–99)
Potassium: 5.4 meq/L — ABNORMAL HIGH (ref 3.5–5.1)
Sodium: 135 meq/L (ref 135–145)

## 2024-07-24 DIAGNOSIS — H16233 Neurotrophic keratoconjunctivitis, bilateral: Secondary | ICD-10-CM | POA: Diagnosis not present

## 2024-07-24 DIAGNOSIS — H02209 Unspecified lagophthalmos unspecified eye, unspecified eyelid: Secondary | ICD-10-CM | POA: Diagnosis not present

## 2024-07-24 DIAGNOSIS — M3501 Sicca syndrome with keratoconjunctivitis: Secondary | ICD-10-CM | POA: Diagnosis not present

## 2024-07-24 DIAGNOSIS — H0288B Meibomian gland dysfunction left eye, upper and lower eyelids: Secondary | ICD-10-CM | POA: Diagnosis not present

## 2024-07-24 DIAGNOSIS — H0288A Meibomian gland dysfunction right eye, upper and lower eyelids: Secondary | ICD-10-CM | POA: Diagnosis not present

## 2024-07-24 DIAGNOSIS — L718 Other rosacea: Secondary | ICD-10-CM | POA: Diagnosis not present

## 2024-07-24 DIAGNOSIS — H353132 Nonexudative age-related macular degeneration, bilateral, intermediate dry stage: Secondary | ICD-10-CM | POA: Diagnosis not present

## 2024-07-24 DIAGNOSIS — H26493 Other secondary cataract, bilateral: Secondary | ICD-10-CM | POA: Diagnosis not present

## 2024-07-24 DIAGNOSIS — H524 Presbyopia: Secondary | ICD-10-CM | POA: Diagnosis not present

## 2024-08-12 ENCOUNTER — Other Ambulatory Visit: Payer: Self-pay | Admitting: Internal Medicine

## 2024-08-28 ENCOUNTER — Inpatient Hospital Stay
Admission: EM | Admit: 2024-08-28 | Discharge: 2024-08-30 | Disposition: A | Discharge status: Home Health | Attending: Internal Medicine | Admitting: Internal Medicine

## 2024-08-28 ENCOUNTER — Emergency Department

## 2024-08-28 ENCOUNTER — Other Ambulatory Visit: Payer: Self-pay

## 2024-08-28 DIAGNOSIS — I6930 Unspecified sequelae of cerebral infarction: Secondary | ICD-10-CM

## 2024-08-28 DIAGNOSIS — E890 Postprocedural hypothyroidism: Secondary | ICD-10-CM | POA: Diagnosis present

## 2024-08-28 DIAGNOSIS — Z7952 Long term (current) use of systemic steroids: Secondary | ICD-10-CM

## 2024-08-28 DIAGNOSIS — M3500 Sicca syndrome, unspecified: Secondary | ICD-10-CM | POA: Diagnosis present

## 2024-08-28 DIAGNOSIS — E785 Hyperlipidemia, unspecified: Secondary | ICD-10-CM | POA: Diagnosis present

## 2024-08-28 DIAGNOSIS — M810 Age-related osteoporosis without current pathological fracture: Secondary | ICD-10-CM | POA: Diagnosis present

## 2024-08-28 DIAGNOSIS — J09X1 Influenza due to identified novel influenza A virus with pneumonia: Secondary | ICD-10-CM | POA: Diagnosis present

## 2024-08-28 DIAGNOSIS — F32A Depression, unspecified: Secondary | ICD-10-CM

## 2024-08-28 DIAGNOSIS — E860 Dehydration: Secondary | ICD-10-CM | POA: Diagnosis present

## 2024-08-28 DIAGNOSIS — A419 Sepsis, unspecified organism: Principal | ICD-10-CM | POA: Diagnosis present

## 2024-08-28 DIAGNOSIS — E871 Hypo-osmolality and hyponatremia: Secondary | ICD-10-CM | POA: Diagnosis present

## 2024-08-28 DIAGNOSIS — M1990 Unspecified osteoarthritis, unspecified site: Secondary | ICD-10-CM | POA: Diagnosis present

## 2024-08-28 DIAGNOSIS — Z79899 Other long term (current) drug therapy: Secondary | ICD-10-CM

## 2024-08-28 DIAGNOSIS — Z66 Do not resuscitate: Secondary | ICD-10-CM | POA: Diagnosis present

## 2024-08-28 DIAGNOSIS — I252 Old myocardial infarction: Secondary | ICD-10-CM

## 2024-08-28 DIAGNOSIS — Z8419 Family history of other disorders of kidney and ureter: Secondary | ICD-10-CM

## 2024-08-28 DIAGNOSIS — Z853 Personal history of malignant neoplasm of breast: Secondary | ICD-10-CM

## 2024-08-28 DIAGNOSIS — F331 Major depressive disorder, recurrent, moderate: Secondary | ICD-10-CM | POA: Diagnosis present

## 2024-08-28 DIAGNOSIS — Z170 Estrogen receptor positive status [ER+]: Secondary | ICD-10-CM

## 2024-08-28 DIAGNOSIS — J1000 Influenza due to other identified influenza virus with unspecified type of pneumonia: Secondary | ICD-10-CM | POA: Diagnosis not present

## 2024-08-28 DIAGNOSIS — I10 Essential (primary) hypertension: Secondary | ICD-10-CM | POA: Diagnosis present

## 2024-08-28 DIAGNOSIS — D638 Anemia in other chronic diseases classified elsewhere: Secondary | ICD-10-CM | POA: Diagnosis present

## 2024-08-28 DIAGNOSIS — J111 Influenza due to unidentified influenza virus with other respiratory manifestations: Principal | ICD-10-CM

## 2024-08-28 DIAGNOSIS — D692 Other nonthrombocytopenic purpura: Secondary | ICD-10-CM | POA: Diagnosis present

## 2024-08-28 DIAGNOSIS — J159 Unspecified bacterial pneumonia: Secondary | ICD-10-CM | POA: Diagnosis present

## 2024-08-28 DIAGNOSIS — D631 Anemia in chronic kidney disease: Secondary | ICD-10-CM | POA: Diagnosis present

## 2024-08-28 DIAGNOSIS — J189 Pneumonia, unspecified organism: Secondary | ICD-10-CM

## 2024-08-28 DIAGNOSIS — Z800 Family history of malignant neoplasm of digestive organs: Secondary | ICD-10-CM

## 2024-08-28 DIAGNOSIS — N182 Chronic kidney disease, stage 2 (mild): Secondary | ICD-10-CM | POA: Diagnosis present

## 2024-08-28 DIAGNOSIS — M320 Drug-induced systemic lupus erythematosus: Secondary | ICD-10-CM | POA: Diagnosis present

## 2024-08-28 DIAGNOSIS — I5032 Chronic diastolic (congestive) heart failure: Secondary | ICD-10-CM | POA: Diagnosis present

## 2024-08-28 DIAGNOSIS — Z8249 Family history of ischemic heart disease and other diseases of the circulatory system: Secondary | ICD-10-CM

## 2024-08-28 DIAGNOSIS — Z85850 Personal history of malignant neoplasm of thyroid: Secondary | ICD-10-CM

## 2024-08-28 DIAGNOSIS — Z9012 Acquired absence of left breast and nipple: Secondary | ICD-10-CM

## 2024-08-28 DIAGNOSIS — K219 Gastro-esophageal reflux disease without esophagitis: Secondary | ICD-10-CM | POA: Diagnosis present

## 2024-08-28 DIAGNOSIS — Z825 Family history of asthma and other chronic lower respiratory diseases: Secondary | ICD-10-CM

## 2024-08-28 DIAGNOSIS — M329 Systemic lupus erythematosus, unspecified: Secondary | ICD-10-CM | POA: Diagnosis present

## 2024-08-28 DIAGNOSIS — I130 Hypertensive heart and chronic kidney disease with heart failure and stage 1 through stage 4 chronic kidney disease, or unspecified chronic kidney disease: Secondary | ICD-10-CM | POA: Diagnosis present

## 2024-08-28 DIAGNOSIS — J1008 Influenza due to other identified influenza virus with other specified pneumonia: Secondary | ICD-10-CM | POA: Diagnosis present

## 2024-08-28 DIAGNOSIS — D696 Thrombocytopenia, unspecified: Secondary | ICD-10-CM | POA: Diagnosis present

## 2024-08-28 DIAGNOSIS — Z87440 Personal history of urinary (tract) infections: Secondary | ICD-10-CM

## 2024-08-28 DIAGNOSIS — Z1721 Progesterone receptor positive status: Secondary | ICD-10-CM

## 2024-08-28 LAB — CBC WITH DIFFERENTIAL/PLATELET
Abs Immature Granulocytes: 0.13 K/uL — ABNORMAL HIGH (ref 0.00–0.07)
Basophils Absolute: 0.1 K/uL (ref 0.0–0.1)
Basophils Relative: 1 %
Eosinophils Absolute: 0 K/uL (ref 0.0–0.5)
Eosinophils Relative: 1 %
HCT: 23.4 % — ABNORMAL LOW (ref 36.0–46.0)
Hemoglobin: 8.1 g/dL — ABNORMAL LOW (ref 12.0–15.0)
Immature Granulocytes: 3 %
Lymphocytes Relative: 12 %
Lymphs Abs: 0.6 K/uL — ABNORMAL LOW (ref 0.7–4.0)
MCH: 38 pg — ABNORMAL HIGH (ref 26.0–34.0)
MCHC: 34.6 g/dL (ref 30.0–36.0)
MCV: 109.9 fL — ABNORMAL HIGH (ref 80.0–100.0)
Monocytes Absolute: 0.3 K/uL (ref 0.1–1.0)
Monocytes Relative: 6 %
Neutro Abs: 3.8 K/uL (ref 1.7–7.7)
Neutrophils Relative %: 77 %
Platelets: 99 K/uL — ABNORMAL LOW (ref 150–400)
RBC: 2.13 MIL/uL — ABNORMAL LOW (ref 3.87–5.11)
RDW: 17.4 % — ABNORMAL HIGH (ref 11.5–15.5)
Smear Review: NORMAL
WBC: 4.9 K/uL (ref 4.0–10.5)
nRBC: 0 % (ref 0.0–0.2)

## 2024-08-28 LAB — RESP PANEL BY RT-PCR (RSV, FLU A&B, COVID)  RVPGX2
Influenza A by PCR: POSITIVE — AB
Influenza B by PCR: NEGATIVE
Resp Syncytial Virus by PCR: NEGATIVE
SARS Coronavirus 2 by RT PCR: NEGATIVE

## 2024-08-28 LAB — PROTIME-INR
INR: 1.1 (ref 0.8–1.2)
Prothrombin Time: 14.9 s (ref 11.4–15.2)

## 2024-08-28 LAB — COMPREHENSIVE METABOLIC PANEL WITH GFR
ALT: 17 U/L (ref 0–44)
AST: 46 U/L — ABNORMAL HIGH (ref 15–41)
Albumin: 3.4 g/dL — ABNORMAL LOW (ref 3.5–5.0)
Alkaline Phosphatase: 94 U/L (ref 38–126)
Anion gap: 9 (ref 5–15)
BUN: 17 mg/dL (ref 8–23)
CO2: 25 mmol/L (ref 22–32)
Calcium: 7.7 mg/dL — ABNORMAL LOW (ref 8.9–10.3)
Chloride: 99 mmol/L (ref 98–111)
Creatinine, Ser: 0.65 mg/dL (ref 0.44–1.00)
GFR, Estimated: >60 mL/min
Glucose, Bld: 120 mg/dL — ABNORMAL HIGH (ref 70–99)
Potassium: 4.1 mmol/L (ref 3.5–5.1)
Sodium: 132 mmol/L — ABNORMAL LOW (ref 135–145)
Total Bilirubin: 0.7 mg/dL (ref 0.0–1.2)
Total Protein: 5.4 g/dL — ABNORMAL LOW (ref 6.5–8.1)

## 2024-08-28 LAB — PROCALCITONIN: Procalcitonin: 1.74 ng/mL

## 2024-08-28 LAB — LACTIC ACID, PLASMA
Lactic Acid, Venous: 1 mmol/L (ref 0.5–1.9)
Lactic Acid, Venous: 0.9 mmol/L (ref 0.5–1.9)

## 2024-08-28 LAB — GROUP A STREP BY PCR: Group A Strep by PCR: NOT DETECTED

## 2024-08-28 MED ORDER — LEVOTHYROXINE SODIUM 50 MCG PO TABS
75.0000 ug | ORAL_TABLET | Freq: Every day | ORAL | Status: DC
  Administered 2024-08-29 – 2024-08-30 (×2): 75 ug via ORAL
  Filled 2024-08-28 (×2): qty 1

## 2024-08-28 MED ORDER — OXYCODONE HCL 5 MG PO TABS: 5.0000 mg | ORAL_TABLET | ORAL | Status: DC | PRN

## 2024-08-28 MED ORDER — POLYETHYLENE GLYCOL 3350 17 G PO PACK: 17.0000 g | PACK | Freq: Every day | ORAL | Status: DC | PRN

## 2024-08-28 MED ORDER — MENTHOL 3 MG MT LOZG
1.0000 | LOZENGE | OROMUCOSAL | Status: DC | PRN
  Administered 2024-08-28: 3 mg via ORAL
  Filled 2024-08-28: qty 9

## 2024-08-28 MED ORDER — PANTOPRAZOLE SODIUM 40 MG PO TBEC
40.0000 mg | DELAYED_RELEASE_TABLET | Freq: Two times a day (BID) | ORAL | Status: DC
  Administered 2024-08-28 – 2024-08-30 (×4): 40 mg via ORAL
  Filled 2024-08-28 (×4): qty 1

## 2024-08-28 MED ORDER — ACETAMINOPHEN 650 MG RE SUPP: 650.0000 mg | Freq: Four times a day (QID) | RECTAL | Status: DC | PRN

## 2024-08-28 MED ORDER — SODIUM CHLORIDE 0.9 % IV SOLN: INTRAVENOUS | Status: DC

## 2024-08-28 MED ORDER — FENTANYL CITRATE (PF) 50 MCG/ML IJ SOSY: 12.5000 ug | PREFILLED_SYRINGE | INTRAMUSCULAR | Status: DC | PRN

## 2024-08-28 MED ORDER — METOPROLOL SUCCINATE ER 25 MG PO TB24
25.0000 mg | ORAL_TABLET | Freq: Every day | ORAL | Status: DC
  Administered 2024-08-28 – 2024-08-30 (×3): 25 mg via ORAL
  Filled 2024-08-28 (×3): qty 1

## 2024-08-28 MED ORDER — ENOXAPARIN SODIUM 40 MG/0.4ML IJ SOSY
40.0000 mg | PREFILLED_SYRINGE | INTRAMUSCULAR | Status: DC
  Administered 2024-08-28 – 2024-08-29 (×2): 40 mg via SUBCUTANEOUS
  Filled 2024-08-28 (×2): qty 0.4

## 2024-08-28 MED ORDER — OSELTAMIVIR PHOSPHATE 30 MG PO CAPS
30.0000 mg | ORAL_CAPSULE | Freq: Two times a day (BID) | ORAL | Status: DC
  Administered 2024-08-28 – 2024-08-30 (×4): 30 mg via ORAL
  Filled 2024-08-28 (×4): qty 1

## 2024-08-28 MED ORDER — ONDANSETRON HCL 4 MG PO TABS: 4.0000 mg | ORAL_TABLET | Freq: Four times a day (QID) | ORAL | Status: DC | PRN

## 2024-08-28 MED ORDER — SODIUM CHLORIDE 0.9 % IV SOLN
2.0000 g | INTRAVENOUS | Status: DC
  Administered 2024-08-29 – 2024-08-30 (×2): 2 g via INTRAVENOUS
  Filled 2024-08-28 (×2): qty 20

## 2024-08-28 MED ORDER — SODIUM CHLORIDE 0.9 % IV SOLN: 500.0000 mg | Freq: Once | INTRAVENOUS | Status: DC

## 2024-08-28 MED ORDER — SODIUM CHLORIDE 0.9 % IV BOLUS
500.0000 mL | Freq: Once | INTRAVENOUS | Status: AC
  Administered 2024-08-28: 500 mL via INTRAVENOUS

## 2024-08-28 MED ORDER — SODIUM CHLORIDE 0.9 % IV SOLN
500.0000 mg | INTRAVENOUS | Status: DC
  Administered 2024-08-28 – 2024-08-30 (×3): 500 mg via INTRAVENOUS
  Filled 2024-08-28 (×4): qty 5

## 2024-08-28 MED ORDER — SODIUM CHLORIDE 0.9 % IV SOLN
2.0000 g | Freq: Once | INTRAVENOUS | Status: AC
  Administered 2024-08-28: 2 g via INTRAVENOUS
  Filled 2024-08-28: qty 20

## 2024-08-28 MED ORDER — ACETAMINOPHEN 325 MG PO TABS
650.0000 mg | ORAL_TABLET | Freq: Four times a day (QID) | ORAL | Status: DC | PRN
  Administered 2024-08-28 – 2024-08-29 (×4): 650 mg via ORAL
  Filled 2024-08-28 (×4): qty 2

## 2024-08-28 MED ORDER — SERTRALINE HCL 50 MG PO TABS
25.0000 mg | ORAL_TABLET | Freq: Every day | ORAL | Status: DC
  Administered 2024-08-28 – 2024-08-30 (×3): 25 mg via ORAL
  Filled 2024-08-28 (×3): qty 1

## 2024-08-28 MED ORDER — OSELTAMIVIR PHOSPHATE 75 MG PO CAPS: 75.0000 mg | ORAL_CAPSULE | Freq: Two times a day (BID) | ORAL | Status: DC

## 2024-08-28 MED ORDER — PREDNISONE 2.5 MG PO TABS
2.5000 mg | ORAL_TABLET | Freq: Every day | ORAL | Status: DC
  Administered 2024-08-28 – 2024-08-30 (×3): 2.5 mg via ORAL
  Filled 2024-08-28 (×3): qty 1

## 2024-08-28 MED ORDER — ONDANSETRON HCL 4 MG/2ML IJ SOLN: 4.0000 mg | Freq: Four times a day (QID) | INTRAMUSCULAR | Status: DC | PRN

## 2024-08-28 MED ADMIN — Oseltamivir Phosphate Cap 75 MG (Base Equiv): 75 mg | ORAL | NDC 33342025866

## 2024-08-28 MED FILL — Oseltamivir Phosphate Cap 75 MG (Base Equiv): 75.0000 mg | ORAL | Qty: 1 | Status: AC

## 2024-08-28 NOTE — H&P (Signed)
 "  History and Physical    Kim Santiago FMW:[id number] DOB: May 28, 1935 DOA: 08/28/2024  DOS: the patient was seen and examined on 08/28/2024  PCP: Marylynn Verneita CROME, MD   Patient coming from: Home  I have personally briefly reviewed patient's old medical records in [facility]  Chief Complaint: Fever and lethargy since yesterday  HPI: Kim Santiago is a pleasant 89 y.o. female with medical history significant for HTN, hypothyroidism, HLD, Sjogren's syndrome, depression, GERD who was brought in from home by EMS for not feeling well and fever since yesterday.  EMS gave patient 1 g of Tylenol  for fever. Patient is alert awake and sleepy not responding to the questions and not able to provide meaningful history.  Patient's daughter was at bedside who advised that patient have sore throat cough and fatigue over the past couple of days.  Patient was being planned for urgent care visit this morning but this morning patient was not speaking anymore and looking confused.  Patient's son-in-law recently had a flu symptoms, thought to be viral bronchitis and was not tested for influenza.  Patient and family was concerned and they brought him to ED instead of urgent care for lethargy, fever and not speaking.  ED Course: Upon arrival to the ED, patient is found to be tachycardic at 113, temperature 103.1, influenza A+, sodium was 132 hemoglobin was 8.1 and hematocrit 23.4, EKG showed sinus tachycardia at 114 bpm no ST elevation, chest x-ray showed possible pneumonia.  CT head with no acute pathology.  Patient was given ceftriaxone  and azithromycin , Tamiflu  and oxygen .  Hospitalist service was consulted for evaluation for admission for acute influenza with possible community-acquired pneumonia.  Review of Systems:  ROS  All other systems negative except as noted in the HPI.  Past Medical History:  Diagnosis Date   Arthritis    Breast cancer (HCC) 2017   left mastectomy done 11/2015   Breast  cancer in female [facility]) 11/18/2015   Left: 3.9 cm tumor, T2, 1/2 sentinel nodes positive for macro metastatic disease, N1, 3 negative nodes in the axillary tail, ER+,PR+, Her 2 neu, low Mammoprint score   Cancer (HCC)    thyroid  takes levothyroxine    CAP (community acquired pneumonia) 05/05/2021   Chronic kidney disease    UTI   Genetic screening 11/2015   Mammoprint of left breast cancer: Low risk for recurrence.    History of heart attack 06/12/2014   Hypertension    hypothyroidism    secondary to thyroidectomy for thyroid  ca   Hypothyroidism    Lupus    subcutaneous   Menopause 40s   natural, hot flashes and mood lability now gone, off prempro 7 months   Myocardial infarction [facility]) 2013   Osteoporosis    Osteopenia   Rosacea    Sjoegren syndrome    Stress-induced cardiomyopathy September of 2013   EF 35%. Peak troponin was 1.8.   Stroke [facility]) 10/2018    Past Surgical History:  Procedure Laterality Date   BACK SURGERY     BREAST BIOPSY Left 10/30/15   positive, done in Dr. Fredirick office   CARDIAC CATHETERIZATION  05/2012   [facility]. No significant CAD. Ejection fraction of 35% due to stress-induced cardiomyopathy.   CHOLECYSTECTOMY     COLONOSCOPY     DILATION AND CURETTAGE OF UTERUS     KYPHOSIS SURGERY  Feb 2008   L1, Dr. Kathi   LUMBAR DISC SURGERY     L4-L5   MASTECTOMY Left  11/18/2015   positive   SENTINEL NODE BIOPSY Left 11/18/2015   Procedure: SENTINEL NODE BIOPSY;  Surgeon: Reyes LELON Cota, MD;  Location: [facility] ORS;  Service: General;  Laterality: Left;   SHOULDER ARTHROSCOPY  2004   Left, Dr. Maryl   SIMPLE MASTECTOMY WITH AXILLARY SENTINEL NODE BIOPSY Left 11/18/2015   Procedure: SIMPLE MASTECTOMY;  Surgeon: Reyes LELON Cota, MD;  Location: [facility] ORS;  Service: General;  Laterality: Left;   SPINE SURGERY     L4-5 diskectomy   THYROIDECTOMY     Thyroid  Cancer   TONSILLECTOMY     TUBAL LIGATION       reports that she has never smoked. She has never  used smokeless tobacco. She reports that she does not drink alcohol and does not use drugs.  Allergies[1]  Family History  Problem Relation Age of Onset   Kidney disease Mother    Heart disease Mother    COPD Father    Cancer Father        esophageal   Kidney disease Sister    Cancer Brother 47       colon cancer (both brothers)   Cancer Brother    Heart attack Brother 53   Heart disease Brother    Breast cancer Neg Hx     Prior to Admission medications  Medication Sig Start Date End Date Taking? Authorizing Provider  atorvastatin  (LIPITOR) 20 MG tablet TAKE 1 TABLET BY MOUTH EVERY DAY AT 6 PM 04/10/24   Marylynn Verneita CROME, MD  Calcium  Carbonate-Vitamin D  600-200 MG-UNIT TABS Take 2 tablets by mouth daily.    [provider]  cholecalciferol  (VITAMIN D ) 1000 UNITS tablet Take 2,000 Units by mouth daily.    [provider]  CRANBERRY PO Take 1 capsule by mouth 2 (two) times daily.    [provider]  fluticasone (FLONASE) 50 MCG/ACT nasal spray Place 1 spray into both nostrils daily. Patient taking differently: Place 1 spray into both nostrils daily as needed. 06/27/23   [provider]  folic acid  (FOLVITE ) 1 MG tablet Take 2 mg by mouth daily. 08/13/21   [provider]  furosemide  (LASIX ) 20 MG tablet TAKE 1 TABLET BY MOUTH EVERY OTHER DAY AS NEEDED 08/14/24   Marylynn Verneita CROME, MD  levothyroxine  (SYNTHROID ) 75 MCG tablet TAKE 1 TABLET BY MOUTH ( TOTAL) DAILY 01/10/24   Marylynn Verneita CROME, MD  methotrexate  (RHEUMATREX) 2.5 MG tablet Take 15 mg by mouth once a week. 08/08/21   [provider]  metoprolol  succinate (TOPROL -XL) 25 MG 24 hr tablet TAKE 1 TABLET BY MOUTH EVERY DAY 04/10/24   Marylynn Verneita CROME, MD  omeprazole  (PRILOSEC) 20 MG capsule TAKE 1 CAPSULE (20 MG TOTAL) BY MOUTH 2 (TWO) TIMES DAILY BEFORE A MEAL. Patient taking differently: Take 20 mg by mouth 2 (two) times daily as needed. 04/10/24   Marylynn Verneita CROME, MD  ondansetron   (ZOFRAN ) 4 MG tablet Take 1 tablet (4 mg total) by mouth every 6 (six) hours. 09/10/23   Tullo, Teresa L, MD  polyethylene glycol (MIRALAX  / GLYCOLAX ) packet Take 17 g by mouth daily.    [provider]  Probiotic Product (PROBIOTIC PO) Take 1 tablet by mouth daily.    [provider]  sertraline  (ZOLOFT ) 25 MG tablet TAKE 1 TABLET (25 MG TOTAL) BY MOUTH DAILY. 03/30/24   Marylynn Verneita CROME, MD  SODIUM FLUORIDE 5000 PPM 1.1 % PSTE See admin instructions. 06/09/23   [provider]  tiZANidine  (  ZANAFLEX ) 2 MG tablet Take 1 tablet (2 mg total) by mouth every 8 (eight) hours as needed for muscle spasms. 06/06/24   Marylynn Verneita CROME, MD  triamcinolone  cream (KENALOG ) 0.1 % APPLY TO AFFECTED AREA TWICE A DAY 03/21/21   [provider]    Physical Exam: Vitals:   08/28/24 1000 08/28/24 1030 08/28/24 1056 08/28/24 1323  BP: (!) 103/54 (!) 101/55  (!) 92/52  Pulse: (!) 105 (!) 108  (!) 104  Resp: (!) 26 (!) 24  18  Temp:   99.5 F (37.5 C) 99.3 F (37.4 C)  TempSrc:   Oral   SpO2: 99% 99%  94%  Weight:        Physical Exam   Constitutional: Alert, awake, and not speaking HEENT: Neck supple Respiratory: Bilateral transmitted sounds and occasional wheezes on both lung  fields Cardiovascular: Regular rate and rhythm, no murmurs / rubs / gallops. No extremity edema. 2+ pedal pulses. No carotid bruits.  Abdomen: Soft, no tenderness, Bowel sounds positive.  Musculoskeletal: no clubbing / cyanosis. Good ROM, no contractures. Normal muscle tone.  Skin: Multiple black and blue areas in the skin Neurologic: CN 2-12 grossly intact. Sensation intact, No focal deficit identified Psychiatric: Alert and oriented x 3. Normal mood.    Labs on Admission: I have personally reviewed following labs and imaging studies  CBC: Recent Labs  Lab 08/28/24 0843  WBC 4.9  NEUTROABS 3.8  HGB 8.1*  HCT 23.4*  MCV 109.9*  PLT 99*   Basic Metabolic Panel: Recent Labs  Lab  08/28/24 0843  NA 132*  K 4.1  CL 99  CO2 25  GLUCOSE 120*  BUN 17  CREATININE 0.65  CALCIUM  7.7*   GFR: Estimated Creatinine Clearance: 42.8 mL/min (by C-G formula based on SCr of 0.65 mg/dL). Liver Function Tests: Recent Labs  Lab 08/28/24 0843  AST 46*  ALT 17  ALKPHOS 94  BILITOT 0.7  PROT 5.4*  ALBUMIN 3.4*   No results for input(s): LIPASE, AMYLASE in the last 168 hours. No results for input(s): AMMONIA in the last 168 hours. Coagulation Profile: Recent Labs  Lab 08/28/24 0856  INR 1.1   Cardiac Enzymes: No results for input(s): CKTOTAL, CKMB, CKMBINDEX, TROPONINI, TROPONINIHS in the last 168 hours. BNP (last 3 results) No results for input(s): BNP in the last 8760 hours. HbA1C: No results for input(s): HGBA1C in the last 72 hours. CBG: No results for input(s): GLUCAP in the last 168 hours. Lipid Profile: No results for input(s): CHOL, HDL, LDLCALC, TRIG, CHOLHDL, LDLDIRECT in the last 72 hours. Thyroid  Function Tests: No results for input(s): TSH, T4TOTAL, FREET4, T3FREE, THYROIDAB in the last 72 hours. Anemia Panel: No results for input(s): VITAMINB12, FOLATE, FERRITIN, TIBC, IRON , RETICCTPCT in the last 72 hours. Urine analysis:    Component Value Date/Time   COLORURINE YELLOW 08/27/2023 1007   APPEARANCEUR Slightly cloudy 05/11/2024 1108   LABSPEC 1.015 08/27/2023 1007   PHURINE 6.0 08/27/2023 1007   GLUCOSEU Negative 05/11/2024 1108   GLUCOSEU NEGATIVE 05/11/2017 1143   HGBUR NEGATIVE 08/27/2023 1007   BILIRUBINUR Negative 05/11/2024 1108   KETONESUR TRACE (A) 08/27/2023 1007   PROTEINUR Trace 05/11/2024 1108   PROTEINUR 100 (A) 08/27/2023 1007   UROBILINOGEN 0.2 07/04/2018 1134   UROBILINOGEN 0.2 05/11/2017 1143   NITRITE Negative 05/11/2024 1108   NITRITE NEGATIVE 08/27/2023 1007   LEUKOCYTESUR 1+ (A) 05/11/2024 1108   LEUKOCYTESUR TRACE (A) 08/27/2023 1007    Radiological Exams on  Admission:  I have personally reviewed images CT HEAD WO CONTRAST ( ) Result Date: 08/28/2024 EXAM: CT HEAD WITHOUT CONTRAST 08/28/2024 10:22:47 AM TECHNIQUE: CT of the head was performed without the administration of intravenous contrast. Automated exposure control, iterative reconstruction, and/or weight based adjustment of the mA/kV was utilized to reduce the radiation dose to as low as reasonably achievable. COMPARISON: 04/25/2024 CLINICAL HISTORY: AMS FINDINGS: BRAIN AND VENTRICLES: No acute hemorrhage. No evidence of acute infarct. No hydrocephalus. No extra-axial collection. No mass effect or midline shift. Age-related cerebral volume loss and moderate to severe periventricular and deep cerebral white matter disease. Moderate calcific atheromatous disease within carotid siphons. ORBITS: No acute abnormality. Status post bilateral lens replacement. SINUSES: Mild scattered ethmoid sinus mucosal thickening. SOFT TISSUES AND SKULL: No acute soft tissue abnormality. No skull fracture. IMPRESSION: 1. No acute intracranial abnormality. 2. Age-related cerebral volume loss and moderate to severe periventricular and deep cerebral white matter disease. 3. Moderate calcific atherosclerotic disease of the carotid siphons. 4. Mild scattered ethmoid sinus mucosal thickening. Electronically signed by: Evalene Coho MD 08/28/2024 10:47 AM EST RP Workstation: [id number]   DG Chest Port 1 View Result Date: 08/28/2024 CLINICAL DATA:  Possible sepsis. EXAM: PORTABLE CHEST 1 VIEW COMPARISON:  05/13/2021. FINDINGS: The heart is enlarged and the mediastinal contour is within normal limits. There is atherosclerotic calcification of the aorta. Patchy airspace disease is present at the lung bases. No effusion or pneumothorax is seen. No acute osseous abnormality. IMPRESSION: Patchy airspace disease at the lung bases, possible atelectasis or infiltrate. Electronically Signed   By: Leita Birmingham M.D.   On: 08/28/2024 10:13     EKG: My personal interpretation of EKG shows: Sinus tachycardia at 114 bpm    Assessment/Plan Principal Problem:   Pneumonia Active Problems:   Purpura   Essential hypertension   Major depressive disorder, recurrent episode, moderate (HCC)   History of CVA with residual deficit   Chronic diastolic heart failure (HCC)   Drug-induced systemic lupus erythematosus, unspecified organ involvement status   Flu syndrome    Assessment and Plan:  This is an 89 year old female with history of CKD, history of stroke, Sjogren syndrome, osteoporosis, HTN, lupus, recurrent UTIs, depression, chronic purpura who was brought in to the hospital for lethargy, sore throat, fever for the last 2 days.  1.  Flu A syndrome/possible community-acquired pneumonia due to flu - Patient will be placed in observation - Patient was started on Tamiflu . - Continue on ceftriaxone  and azithromycin  - Follow cultures - Continue oxygen  to maintain saturation more than 90% - Give her gentle hydration at 75 cc/h normal saline due to dehydration due to flu  2.  HTN - Continue metoprolol  at home dose - Continue monitor blood pressure  3.  Hyperlipidemia - Will hold off statin due to viral illness - We can resume after she gets better  4.  GERD - Will continue her on Protonix  oral  5.  Hypothyroidism - Resume home dose of levothyroxine   6.  Depression - Resume her home dose of sertraline   7.  Patient is on Lasix  - Will hold off that until tomorrow due to dehydration - Once she is hydrated and she is at baseline may consider resuming her medications  8.  Deconditioning PT/OT    DVT prophylaxis: Lovenox  Code Status: DNR/DNI(Do NOT Intubate) Family Communication: Daughter at bedside  Disposition Plan: Home vs SNF  Consults called: None  Admission status: Observation, Telemetry bed   Nena Rebel, MD [facility] 08/28/2024, 1:28 PM        [  1]  Allergies Allergen Reactions    Naprosyn [Naproxen] Swelling   Amoxicillin  Rash    Tolerates first and third generation cephalosporins   Azathioprine  Nausea And Vomiting    Severe vomiting   Codeine Nausea And Vomiting   Hydroxychloroquine Hives and Nausea And Vomiting   Mycophenolate Mofetil Nausea Only   Orudis [Ketoprofen] Hives   Sulfa Antibiotics Rash   Sulfathiazole Rash   "

## 2024-08-28 NOTE — ED Provider Notes (Signed)
 "  [facility] Provider Note    Event Date/Time   First MD Initiated Contact with Patient 08/28/24 [phone]     (approximate)   History   Influenza  Pt arrived via EMS. Per EMS pt was not feeling well yesterday and today has a fever and lethargic. EMS gave pt 1g of tylenol  for the fever via a 22g in the right hand.     HPI Kim Santiago is a 89 y.o. female PMH CKD, prior stroke, Sjogren syndrome, osteoporosis, hypertension, lupus, recurrent UTI, depression presents for evaluation of fatigue, cough, AMS - Patient is a noncontributory historian.  Alert but not responding to any questions.  Collateral gathered from daughter at bedside. -Daughter states patient has had sore throat, cough and fatigue over the past couple days.  Was planning to go to urgent care this morning but when she checked on her this morning patient was not speaking anymore and was having some difficulty breathing.  Patient's daughter's husband recently with flulike symptoms, thought have had a viral bronchitis, was not specifically tested for influenza. -per EMS, febrile to 103.  Received 1 g Tylenol  en route to hospital.     Physical Exam   Triage Vital Signs: BP 127/68 (BP Location: Right Arm)   Pulse (!) 113   Temp (!) 103.1 F (39.5 C) (Oral)   Resp 17   Wt 61.8 kg   SpO2 92%   BMI 23.39 kg/m     Most recent vital signs: Vitals:   08/28/24 0850 08/28/24 0851  BP:  127/68  Pulse: (!) 113   Resp: 17   Temp: (!) 103.1 F (39.5 C)   SpO2: 92%      General: Awake, no distress.  HEENT: Normocephalic, atraumatic.  Oropharynx with trace blood on tongue, no lacerations appreciated.  No tonsillar swelling, exudate appreciated, no uvular deviation, no stridor, no trismus, no pooling of secretions.  No . CV:  Good peripheral perfusion.  Tachycardic, regular rhythm, RP 2+ Resp:  Tachypneic, coarse breath sounds bilaterally, protecting airway Abd:  No distention. Nontender to deep  palpation throughout Neuro:  Alert, unclear orientation, face symmetric, moving all extremities spontaneously, no focal motor deficit appreciated   ED Results / Procedures / Treatments   Labs (all labs ordered are listed, but only abnormal results are displayed) Labs Reviewed  RESP PANEL BY RT-PCR (RSV, FLU A&B, COVID)  [id number] - Abnormal; Notable for the following components:      Result Value   Influenza A by PCR POSITIVE (*)    All other components within normal limits  COMPREHENSIVE METABOLIC PANEL WITH GFR - Abnormal; Notable for the following components:   Sodium 132 (*)    Glucose, Bld 120 (*)    Calcium  7.7 (*)    Total Protein 5.4 (*)    Albumin 3.4 (*)    AST 46 (*)    All other components within normal limits  CBC WITH DIFFERENTIAL/PLATELET - Abnormal; Notable for the following components:   RBC 2.13 (*)    Hemoglobin 8.1 (*)    HCT 23.4 (*)    MCV 109.9 (*)    MCH 38.0 (*)    RDW 17.4 (*)    Platelets 99 (*)    Lymphs Abs 0.6 (*)    Abs Immature Granulocytes 0.13 (*)    All other components within normal limits  GROUP A STREP BY PCR  CULTURE, BLOOD (ROUTINE X 2)  CULTURE, BLOOD (ROUTINE X 2)  LACTIC ACID,  PLASMA  PROTIME-INR  LACTIC ACID, PLASMA  URINALYSIS, W/ REFLEX TO CULTURE (INFECTION SUSPECTED)     EKG  Ecg = sinus tachycardia, rate 114, no gross ST elevation or depression, no significant repolarization abnormality, right axis deviation, normal intervals.  No clear evidence of ischemia no arrhythmia my interpretation.   RADIOLOGY Radiology interpreted by myself radiology reports reviewed.  Chest x-ray with possible pneumonia.  CT head no acute pathology on my read.    PROCEDURES:  Critical Care performed: Yes, see critical care procedure note(s)  .Critical Care  Performed by: Clarine Ozell LABOR, MD Authorized by: Clarine Ozell LABOR, MD   Critical care provider statement:    Critical care time (minutes):  30   Critical care time was exclusive  of:  Separately billable procedures and treating other patients   Critical care was necessary to treat or prevent imminent or life-threatening deterioration of the following conditions:  Sepsis   Critical care was time spent personally by me on the following activities:  Development of treatment plan with patient or surrogate, discussions with consultants, evaluation of patient's response to treatment, examination of patient, ordering and review of laboratory studies, ordering and review of radiographic studies, ordering and performing treatments and interventions, pulse oximetry, re-evaluation of patient's condition and review of old charts   I assumed direction of critical care for this patient from another provider in my specialty: no     Care discussed with: admitting provider      MEDICATIONS ORDERED IN ED: Medications  cefTRIAXone  (ROCEPHIN ) 2 g in sodium chloride  0.9 % 100 mL IVPB (has no administration in time range)  azithromycin  (ZITHROMAX ) 500 mg in sodium chloride  0.9 % 250 mL IVPB (has no administration in time range)  sodium chloride  0.9 % bolus 500 mL (500 mLs Intravenous New Bag/Given 08/28/24 0923)     IMPRESSION / MDM / ASSESSMENT AND PLAN / ED COURSE  I reviewed the triage vital signs and the nursing notes.                              DDX/MDM/AP: Differential diagnosis includes, but is not limited to, viral syndrome including influenza or COVID-19, consider underlying pneumonia, UTI, doubt intra-abdominal source at this time.  Suspect infectious encephalopathy, consider metabolic encephalopathy, considered but doubt acute intracranial pathology.  Plan: - Labs - Supplemental oxygen  (satting about 90% on room air) - chest x-ray - CT head EKG - Status post Tylenol  - Reduced modified fluid bolus of 500 cc given advanced age and not meeting septic shock criteria at this time - Low threshold for antibiotics - Anticipate admission  Patient's presentation is most  consistent with acute presentation with potential threat to life or bodily function.  The patient is on the cardiac monitor to evaluate for evidence of arrhythmia and/or significant heart rate changes.  ED course below.  Workup with positive influenza A test.  Chest x-ray also with possible pneumonia.  Given overall clinical picture and coordination with admitting hospitalist, treating with ceftriaxone  and azithromycin .  Remains with good perfusion on sepsis reeval.  Given reduced modified fluid bolus of 500 cc given advanced age and desire to avoid hypervolemia, not meeting septic shock criteria at this time.  Mated to hospitalist service.  Overall suspect some degree of encephalopathy in the setting of influenza and possible underlying pneumonia  Clinical Course as of 08/28/24 1033  Mon Aug 28, 2024  0938 CBC with mild worsening of  chronic anemia, mildly worsened thrombocytopenia, mild hyponatremia [MM]  0938 Lactate normal [MM]  1011 Strep neg [MM]  1019 Influenza A By PCR(!): POSITIVE [MM]  1020 CT head with no obvious hemorrhage nor other acute pathology on my interpretation  Formal radiology report pending  Overall suspect influenza causing encephalopathy, weakness.  Will defer IV antibiotics at this time.  Will plan for admission, hospitalist consult order placed [MM]  1030 Chest x-ray with atelectasis versus infiltrate  Discussed with admitting hospitalist who would like to proceed with antibiotics, ceftriaxone  and azithromycin  ordered [MM]    Clinical Course User Index [MM] Clarine Ozell LABOR, MD     FINAL CLINICAL IMPRESSION(S) / ED DIAGNOSES   Final diagnoses:  Influenza  Pneumonia due to infectious organism, unspecified laterality, unspecified part of lung  Sepsis without acute organ dysfunction, due to unspecified organism [facility])     Rx / DC Orders   ED Discharge Orders     None        Note:  This document was prepared using Dragon voice recognition software  and may include unintentional dictation errors.   Clarine Ozell LABOR, MD 08/28/24 [phone]  "

## 2024-08-28 NOTE — Plan of Care (Signed)
" °  Problem: Education: Goal: Knowledge of General Education information will improve Description: Including pain rating scale, medication(s)/side effects and non-pharmacologic comfort measures Outcome: Progressing   Problem: Activity: Goal: Risk for activity intolerance will decrease Outcome: Progressing   Problem: Coping: Goal: Level of anxiety will decrease Outcome: Progressing   Problem: Activity: Goal: Ability to tolerate increased activity will improve Outcome: Progressing   Problem: Pain Managment: Goal: General experience of comfort will improve and/or be controlled Outcome: Progressing   "

## 2024-08-28 NOTE — ED Triage Notes (Signed)
 Pt arrived via EMS. Per EMS pt was not feeling well yesterday and today has a fever and lethargic. EMS gave pt 1g of tylenol  for the fever via a 22g in the right hand.

## 2024-08-28 NOTE — Plan of Care (Signed)
  Problem: Clinical Measurements: Goal: Ability to maintain clinical measurements within normal limits will improve Outcome: Progressing   Problem: Clinical Measurements: Goal: Respiratory complications will improve Outcome: Progressing   Problem: Pain Managment: Goal: General experience of comfort will improve and/or be controlled Outcome: Progressing   Problem: Safety: Goal: Ability to remain free from injury will improve Outcome: Progressing   Problem: Skin Integrity: Goal: Risk for impaired skin integrity will decrease Outcome: Progressing

## 2024-08-28 NOTE — Evaluation (Signed)
 Occupational Therapy Evaluation Patient Details Name: Kim Santiago MRN: [id number] DOB: [birth year] Today's Date: 08/28/2024   History of Present Illness   Pt is an 89 y/o F admitted on 08/28/24 after presenting with c/o fever & not feeling well. It tested positive for Flu A. Is also being treated for PNA. PMH: HTN, hypothyroidism, HLD, Sjogren's syndrome, depression, GERD, breast CA, CKD, heart attack, lupus     Clinical Impressions Pt was seen for OT evaluation this date. PTA, pt resides at home with family where she is left alone a few hours. She is typically MOD I with ADLs and mobility using rollator PRN vs furniture walking. Daughter manages IADLs and checks over pt's meds. Pt presents with deficits in strength and activity tolerance limiting their ability to perform ADL management at baseline level. Pt is HOH. Pt currently requires Min/CGA for all ADL transfers and mobility during session. LB dressing, toileting hygiene and standing hand hygiene performed with CGA and cues for safety. Pt is not far from her baseline, but moving a little slower per daughter report. Does have shuffled gait at baseline per daughter. Will follow acutely to prevent functional decline/further weakness with HHOT recommended upon hospital DC.       If plan is discharge home, recommend the following:   A little help with walking and/or transfers;A little help with bathing/dressing/bathroom;Help with stairs or ramp for entrance;Assist for transportation     Functional Status Assessment   Patient has had a recent decline in their functional status and demonstrates the ability to make significant improvements in function in a reasonable and predictable amount of time.     Equipment Recommendations   None recommended by OT     Recommendations for Other Services         Precautions/Restrictions   Precautions Precautions: Fall Restrictions Weight Bearing Restrictions Per Provider Order:  No     Mobility Bed Mobility               General bed mobility comments: NT up in recliner pre/post session    Transfers Overall transfer level: Needs assistance Equipment used: Rolling walker (2 wheels) Transfers: Sit to/from Stand Sit to Stand: Min assist, Contact guard assist           General transfer comment: from recliner and toilet during session, ambulate with RW with cues for proximity, Min/CGA for RW management      Balance Overall balance assessment: Needs assistance Sitting-balance support: Feet supported Sitting balance-Leahy Scale: Good Sitting balance - Comments: no LOB during LB dressing on toilet   Standing balance support: Bilateral upper extremity supported, Reliant on assistive device for balance, During functional activity Standing balance-Leahy Scale: Fair Standing balance comment: RW use                           ADL either performed or assessed with clinical judgement   ADL Overall ADL's : Needs assistance/impaired     Grooming: Wash/dry hands;Supervision/safety;Standing;Contact guard assist               Lower Body Dressing: Contact guard assist;Sitting/lateral leans;Sit to/from stand Lower Body Dressing Details (indicate cue type and reason): remove soiled pull up and donn mesh underwear Toilet Transfer: Contact guard assist;Rolling walker (2 wheels);Regular Toilet;Grab bars Toilet Transfer Details (indicate cue type and reason): cues for grab bar use Toileting- Clothing Manipulation and Hygiene: Supervision/safety;Sitting/lateral lean Toileting - Clothing Manipulation Details (indicate cue type and reason): after cont urine  on toilet     Functional mobility during ADLs: Contact guard assist;Rolling walker (2 wheels);Supervision/safety;Cueing for safety       Vision         Perception         Praxis         Pertinent Vitals/Pain Pain Assessment Pain Assessment: No/denies pain     Extremity/Trunk  Assessment Upper Extremity Assessment Upper Extremity Assessment: Overall WFL for tasks assessed   Lower Extremity Assessment Lower Extremity Assessment: Generalized weakness;Overall [facility] for tasks assessed   Cervical / Trunk Assessment Cervical / Trunk Assessment: Kyphotic   Communication Communication Communication: Impaired Factors Affecting Communication: Hearing impaired (hearing aides not here)   Cognition Arousal: Alert Behavior During Therapy: WFL for tasks assessed/performed                                 Following commands: Intact       Cueing  General Comments   Cueing Techniques: Verbal cues  SpO2 >/= 90%   Exercises Other Exercises Other Exercises: Edu on role of OT in acute setting and DC recommendations.   Shoulder Instructions      Home Living Family/patient expects to be discharged to:: Private residence Living Arrangements: Children Available Help at Discharge: Family;Available PRN/intermittently (pt home alone for a couple hours here & there) Type of Home: House Home Access: Level entry     Home Layout: Two level;Able to live on main level with bedroom/bathroom     Bathroom Shower/Tub: Producer, Television/film/video: Standard     Home Equipment: Patent Examiner (4 wheels);BSC/3in1;Grab bars - tub/shower;Rolling Walker (2 wheels)          Prior Functioning/Environment               Mobility Comments: PRN use of furniture walking vs rollator, 1 fall the night prior to admission ADLs Comments: independent with bathing & dressing, pt manages her medications but daughter checks over them, daughter does cooking, cleaning, uses BSC to sit in the shower    OT Problem List: Decreased strength;Decreased activity tolerance   OT Treatment/Interventions: Self-care/ADL training;Therapeutic exercise;Patient/family education;Balance training;Therapeutic activities;Energy conservation;DME and/or AE instruction      OT  Goals(Current goals can be found in the care plan section)   Acute Rehab OT Goals Patient Stated Goal: get better OT Goal Formulation: With patient/family Time For Goal Achievement: 09/11/24 Potential to Achieve Goals: Good ADL Goals Pt Will Perform Lower Body Dressing: (P) with modified independence;sitting/lateral leans;sit to/from stand Pt Will Transfer to Toilet: (P) with modified independence;ambulating Additional ADL Goal #1: (P) Pt will demo implementation of 1 learned ECS during ADL performance 2/2 trials to maximize safety/IND and prevent overexertion.   OT Frequency:  Min 2X/week    Co-evaluation              AM-PAC OT 6 Clicks Daily Activity     Outcome Measure Help from another person eating meals?: A Little Help from another person taking care of personal grooming?: A Little Help from another person toileting, which includes using toliet, bedpan, or urinal?: A Little Help from another person bathing (including washing, rinsing, drying)?: A Little Help from another person to put on and taking off regular upper body clothing?: A Little Help from another person to put on and taking off regular lower body clothing?: A Little 6 Click Score: 18   End of Session Equipment Utilized During Treatment:  Rolling walker (2 wheels) Nurse Communication: Mobility status  Activity Tolerance: Patient tolerated treatment well Patient left: in chair;with call bell/phone within reach;with chair alarm set;with family/visitor present  OT Visit Diagnosis: Other abnormalities of gait and mobility (R26.89);Muscle weakness (generalized) (M62.81)                Time: 8366-8351 OT Time Calculation (min): 15 min Charges:  OT General Charges $OT Visit: 1 Visit OT Evaluation $OT Eval Low Complexity: 1 Low Tequila Rottmann Chrismon, OTR/L 08/28/2024, 5:00 PM  Genasis Zingale E Chrismon 08/28/2024, 4:59 PM

## 2024-08-28 NOTE — Evaluation (Signed)
 Physical Therapy Evaluation Patient Details Name: Kim Santiago MRN: [id number] DOB: [birth year] Today's Date: 08/28/2024  History of Present Illness  Pt is an 89 y/o F admitted on 08/28/24 after presenting with c/o fever & not feeling well. It tested positive for Flu A. Is also being treated for PNA. PMH: HTN, hypothyroidism, HLD, Sjogren's syndrome, depression, GERD, breast CA, CKD, heart attack, lupus  Clinical Impression  Pt seen for PT evaluation with pt agreeable to tx, daughter present & assisting with PLOF, home set up information. Prior to admission pt was living with daughter on main level of 2 story home, ambulating PRN with rollator, notes 1 fall prior to admission. On this date, pt requires CGA<>min assist overall, is able to ambulate into hallway with RW. Pt is mobilizing well but not at PLOF. Recommend ongoing PT services to address strength, balance, endurance.         If plan is discharge home, recommend the following: A little help with walking and/or transfers;A little help with bathing/dressing/bathroom;Assist for transportation;Help with stairs or ramp for entrance   Can travel by private vehicle        Equipment Recommendations None recommended by PT (pt has all DME needs)  Recommendations for Other Services       Functional Status Assessment Patient has had a recent decline in their functional status and demonstrates the ability to make significant improvements in function in a reasonable and predictable amount of time.     Precautions / Restrictions Precautions Precautions: Fall Restrictions Weight Bearing Restrictions Per Provider Order: No      Mobility  Bed Mobility Overal bed mobility: Needs Assistance Bed Mobility: Supine to Sit     Supine to sit: Min assist, HOB elevated, Used rails          Transfers Overall transfer level: Needs assistance Equipment used: Rolling walker (2 wheels) Transfers: Sit to/from Stand Sit to Stand: Min  assist           General transfer comment: sit>stand from EOB    Ambulation/Gait Ambulation/Gait assistance: Min assist, Contact guard assist Gait Distance (Feet): 80 Feet Assistive device: Rolling walker (2 wheels) Gait Pattern/deviations: Decreased step length - right, Decreased step length - left, Decreased stride length, Shuffle Gait velocity: decreased     General Gait Details: initially with shuffled gait  Stairs            Wheelchair Mobility     Tilt Bed    Modified Rankin (Stroke Patients Only)       Balance Overall balance assessment: Needs assistance Sitting-balance support: Feet supported Sitting balance-Leahy Scale: Fair Sitting balance - Comments: supervision static sitting EOB   Standing balance support: Bilateral upper extremity supported, Reliant on assistive device for balance, During functional activity Standing balance-Leahy Scale: Fair                               Pertinent Vitals/Pain Pain Assessment Pain Assessment: No/denies pain    Home Living Family/patient expects to be discharged to:: Private residence Living Arrangements: Children Available Help at Discharge: Family;Available PRN/intermittently (pt home alone for a couple hours here & there) Type of Home: House Home Access: Level entry       Home Layout: Two level;Able to live on main level with bedroom/bathroom Home Equipment: Toilet riser;Rollator (4 wheels);BSC/3in1;Grab bars - tub/shower;Rolling Walker (2 wheels)      Prior Function  Mobility Comments: PRN use of furniture walking vs rollator, 1 fall the night prior to admission ADLs Comments: independent with bathing & dressing, pt manages her medications but daughter checks over them, daughter does cooking, cleaning, uses BSC to sit in the shower     Extremity/Trunk Assessment   Upper Extremity Assessment Upper Extremity Assessment: Overall WFL for tasks assessed    Lower  Extremity Assessment Lower Extremity Assessment: Generalized weakness;Overall [facility] for tasks assessed    Cervical / Trunk Assessment Cervical / Trunk Assessment: Kyphotic  Communication   Communication Communication: Impaired Factors Affecting Communication: Hearing impaired (hearing aides not here)    Cognition Arousal: Alert Behavior During Therapy: WFL for tasks assessed/performed   PT - Cognitive impairments: No apparent impairments                         Following commands: Intact       Cueing Cueing Techniques: Verbal cues     General Comments General comments (skin integrity, edema, etc.): SpO2 >/= 90%    Exercises     Assessment/Plan    PT Assessment Patient needs continued PT services  PT Problem List Decreased strength;Cardiopulmonary status limiting activity;Decreased range of motion;Decreased activity tolerance;Decreased mobility;Decreased balance;Decreased knowledge of use of DME;Decreased safety awareness       PT Treatment Interventions DME instruction;Therapeutic exercise;Gait training;Balance training;Stair training;Neuromuscular re-education;Functional mobility training;Patient/family education;Therapeutic activities    PT Goals (Current goals can be found in the Care Plan section)  Acute Rehab PT Goals Patient Stated Goal: get better PT Goal Formulation: With patient/family Time For Goal Achievement: 09/11/24 Potential to Achieve Goals: Good    Frequency Min 2X/week     Co-evaluation               AM-PAC PT 6 Clicks Mobility  Outcome Measure Help needed turning from your back to your side while in a flat bed without using bedrails?: None Help needed moving from lying on your back to sitting on the side of a flat bed without using bedrails?: A Little Help needed moving to and from a bed to a chair (including a wheelchair)?: A Little Help needed standing up from a chair using your arms (e.g., wheelchair or bedside chair)?: A  Little Help needed to walk in hospital room?: A Little Help needed climbing 3-5 steps with a railing? : A Little 6 Click Score: 19    End of Session   Activity Tolerance: Patient tolerated treatment well Patient left: in chair;with family/visitor present (in handoff to OT) Nurse Communication: Mobility status PT Visit Diagnosis: Difficulty in walking, not elsewhere classified (R26.2);Other abnormalities of gait and mobility (R26.89);Muscle weakness (generalized) (M62.81)    Time: 8382-8363 PT Time Calculation (min) (ACUTE ONLY): 19 min   Charges:   PT Evaluation $PT Eval Low Complexity: 1 Low   PT General Charges $$ ACUTE PT VISIT: 1 Visit         Richerd Pinal, PT, DPT 08/28/2024, 4:47 PM   Richerd CHRISTELLA Pinal 08/28/2024, 4:44 PM

## 2024-08-29 DIAGNOSIS — F32A Depression, unspecified: Secondary | ICD-10-CM

## 2024-08-29 DIAGNOSIS — F331 Major depressive disorder, recurrent, moderate: Secondary | ICD-10-CM | POA: Diagnosis present

## 2024-08-29 DIAGNOSIS — J1008 Influenza due to other identified influenza virus with other specified pneumonia: Secondary | ICD-10-CM | POA: Diagnosis present

## 2024-08-29 DIAGNOSIS — Z7952 Long term (current) use of systemic steroids: Secondary | ICD-10-CM | POA: Diagnosis not present

## 2024-08-29 DIAGNOSIS — N182 Chronic kidney disease, stage 2 (mild): Secondary | ICD-10-CM | POA: Diagnosis present

## 2024-08-29 DIAGNOSIS — Z66 Do not resuscitate: Secondary | ICD-10-CM | POA: Diagnosis present

## 2024-08-29 DIAGNOSIS — Z79899 Other long term (current) drug therapy: Secondary | ICD-10-CM | POA: Diagnosis not present

## 2024-08-29 DIAGNOSIS — I6930 Unspecified sequelae of cerebral infarction: Secondary | ICD-10-CM | POA: Diagnosis not present

## 2024-08-29 DIAGNOSIS — M810 Age-related osteoporosis without current pathological fracture: Secondary | ICD-10-CM | POA: Diagnosis present

## 2024-08-29 DIAGNOSIS — J159 Unspecified bacterial pneumonia: Secondary | ICD-10-CM | POA: Diagnosis present

## 2024-08-29 DIAGNOSIS — I130 Hypertensive heart and chronic kidney disease with heart failure and stage 1 through stage 4 chronic kidney disease, or unspecified chronic kidney disease: Secondary | ICD-10-CM | POA: Diagnosis present

## 2024-08-29 DIAGNOSIS — E785 Hyperlipidemia, unspecified: Secondary | ICD-10-CM | POA: Diagnosis present

## 2024-08-29 DIAGNOSIS — M320 Drug-induced systemic lupus erythematosus: Secondary | ICD-10-CM | POA: Diagnosis present

## 2024-08-29 DIAGNOSIS — F3289 Other specified depressive episodes: Secondary | ICD-10-CM | POA: Diagnosis not present

## 2024-08-29 DIAGNOSIS — E890 Postprocedural hypothyroidism: Secondary | ICD-10-CM | POA: Diagnosis present

## 2024-08-29 DIAGNOSIS — D638 Anemia in other chronic diseases classified elsewhere: Secondary | ICD-10-CM | POA: Diagnosis not present

## 2024-08-29 DIAGNOSIS — A419 Sepsis, unspecified organism: Secondary | ICD-10-CM | POA: Diagnosis present

## 2024-08-29 DIAGNOSIS — Z8249 Family history of ischemic heart disease and other diseases of the circulatory system: Secondary | ICD-10-CM | POA: Diagnosis not present

## 2024-08-29 DIAGNOSIS — Z87440 Personal history of urinary (tract) infections: Secondary | ICD-10-CM | POA: Diagnosis not present

## 2024-08-29 DIAGNOSIS — M3500 Sicca syndrome, unspecified: Secondary | ICD-10-CM | POA: Diagnosis present

## 2024-08-29 DIAGNOSIS — D696 Thrombocytopenia, unspecified: Secondary | ICD-10-CM

## 2024-08-29 DIAGNOSIS — I10 Essential (primary) hypertension: Secondary | ICD-10-CM

## 2024-08-29 DIAGNOSIS — I5032 Chronic diastolic (congestive) heart failure: Secondary | ICD-10-CM

## 2024-08-29 DIAGNOSIS — D631 Anemia in chronic kidney disease: Secondary | ICD-10-CM | POA: Diagnosis present

## 2024-08-29 DIAGNOSIS — J09X1 Influenza due to identified novel influenza A virus with pneumonia: Secondary | ICD-10-CM

## 2024-08-29 DIAGNOSIS — Z9012 Acquired absence of left breast and nipple: Secondary | ICD-10-CM | POA: Diagnosis not present

## 2024-08-29 DIAGNOSIS — E860 Dehydration: Secondary | ICD-10-CM | POA: Diagnosis present

## 2024-08-29 DIAGNOSIS — J189 Pneumonia, unspecified organism: Secondary | ICD-10-CM | POA: Diagnosis present

## 2024-08-29 DIAGNOSIS — K219 Gastro-esophageal reflux disease without esophagitis: Secondary | ICD-10-CM | POA: Diagnosis present

## 2024-08-29 DIAGNOSIS — E871 Hypo-osmolality and hyponatremia: Secondary | ICD-10-CM | POA: Diagnosis present

## 2024-08-29 LAB — CBC
HCT: 21 % — ABNORMAL LOW (ref 36.0–46.0)
Hemoglobin: 7.2 g/dL — ABNORMAL LOW (ref 12.0–15.0)
MCH: 37.5 pg — ABNORMAL HIGH (ref 26.0–34.0)
MCHC: 34.3 g/dL (ref 30.0–36.0)
MCV: 109.4 fL — ABNORMAL HIGH (ref 80.0–100.0)
Platelets: 82 K/uL — ABNORMAL LOW (ref 150–400)
RBC: 1.92 MIL/uL — ABNORMAL LOW (ref 3.87–5.11)
RDW: 17.5 % — ABNORMAL HIGH (ref 11.5–15.5)
WBC: 5.2 K/uL (ref 4.0–10.5)
nRBC: 0 % (ref 0.0–0.2)

## 2024-08-29 LAB — COMPREHENSIVE METABOLIC PANEL WITH GFR
ALT: 16 U/L (ref 0–44)
AST: 37 U/L (ref 15–41)
Albumin: 3.2 g/dL — ABNORMAL LOW (ref 3.5–5.0)
Alkaline Phosphatase: 87 U/L (ref 38–126)
Anion gap: 5 (ref 5–15)
BUN: 15 mg/dL (ref 8–23)
CO2: 27 mmol/L (ref 22–32)
Calcium: 7.5 mg/dL — ABNORMAL LOW (ref 8.9–10.3)
Chloride: 102 mmol/L (ref 98–111)
Creatinine, Ser: 0.66 mg/dL (ref 0.44–1.00)
GFR, Estimated: >60 mL/min
Glucose, Bld: 91 mg/dL (ref 70–99)
Potassium: 4.1 mmol/L (ref 3.5–5.1)
Sodium: 133 mmol/L — ABNORMAL LOW (ref 135–145)
Total Bilirubin: 0.6 mg/dL (ref 0.0–1.2)
Total Protein: 5.2 g/dL — ABNORMAL LOW (ref 6.5–8.1)

## 2024-08-29 LAB — PREPARE RBC (CROSSMATCH)

## 2024-08-29 LAB — PROTIME-INR
INR: 1.2 (ref 0.8–1.2)
Prothrombin Time: 16.2 s — ABNORMAL HIGH (ref 11.4–15.2)

## 2024-08-29 MED ORDER — SALINE SPRAY 0.65 % NA SOLN
1.0000 | NASAL | Status: DC | PRN
  Administered 2024-08-29: 1 via NASAL
  Filled 2024-08-29: qty 44

## 2024-08-29 MED ORDER — FOLIC ACID 1 MG PO TABS
3.0000 mg | ORAL_TABLET | Freq: Every day | ORAL | Status: DC
  Administered 2024-08-30: 3 mg via ORAL
  Filled 2024-08-29: qty 3

## 2024-08-29 MED ORDER — SODIUM CHLORIDE 0.9% IV SOLUTION: Freq: Once | INTRAVENOUS | Status: AC

## 2024-08-29 MED ORDER — ATORVASTATIN CALCIUM 20 MG PO TABS
20.0000 mg | ORAL_TABLET | Freq: Every evening | ORAL | Status: DC
  Administered 2024-08-29: 20 mg via ORAL
  Filled 2024-08-29: qty 1

## 2024-08-29 MED ORDER — FUROSEMIDE 10 MG/ML IJ SOLN
20.0000 mg | Freq: Once | INTRAMUSCULAR | Status: AC
  Administered 2024-08-29: 20 mg via INTRAVENOUS
  Filled 2024-08-29: qty 4

## 2024-08-29 NOTE — TOC Initial Note (Signed)
 Transition of Care [facility]) - Initial/Assessment Note    Patient Details  Name: Kim Santiago MRN: [id number] Date of Birth: 09/26/1934  Transition of Care [facility]) CM/SW Contact:    Alvaro Louder, LCSW Phone Number: 08/29/2024, 1:41 PM  Clinical Narrative:  Per chart review patient is from home PCP is Verneita [facility]. Patient selected HH agency [facility] at DC.   TOC to follow for discharge        Patient Goals and CMS Choice            Expected Discharge Plan and Services                                              Prior Living Arrangements/Services                       Activities of Daily Living      Permission Sought/Granted                  Emotional Assessment              Admission diagnosis:  Pneumonia [J18.9] Influenza [J11.1] Pneumonia due to infectious organism, unspecified laterality, unspecified part of lung [J18.9] Sepsis without acute organ dysfunction, due to unspecified organism [facility]) [A41.9] Patient Active Problem List   Diagnosis Date Noted   Pneumonia 08/28/2024   Flu syndrome 08/28/2024   Hyperkalemia due to angiotensin converting enzyme inhibitor (ACE-I) 07/02/2024   Radiculitis, cervical 06/06/2024   Drug-induced systemic lupus erythematosus, unspecified organ involvement status 06/06/2024   Stage 3a chronic kidney disease (HCC) 06/06/2024   Gastritis 09/11/2023   Chronic low back pain with bilateral sciatica 09/11/2023   Gait instability 04/21/2022   Iron  deficiency anemia 12/23/2021   Frequent epistaxis 12/23/2021   Chronic diastolic heart failure (HCC) 06/19/2021   COVID-19 virus infection 04/20/2021   Generalized weakness 11/23/2020   Vaccine reaction, initial encounter 08/20/2020   Hemiplegia affecting dominant side, post-stroke (HCC) 01/16/2020   Does use hearing aid 07/14/2019   Impaired balance as late effect of cerebrovascular accident 11/13/2018   History of CVA with residual deficit  11/13/2018   Leukopenia 11/11/2018   Anxiety about health 07/05/2018   Mediastinal adenopathy 04/27/2018   Edema due to hypoalbuminemia 02/20/2018   Hospital discharge follow-up 02/20/2018   History of vertebral fracture 02/18/2018   GERD (gastroesophageal reflux disease) 10/31/2017   Hand joint pain 06/13/2017   Vaginal atrophy 05/11/2017   Venous insufficiency of both lower extremities 01/10/2017   Subacute cutaneous lupus erythematosus 06/26/2016   Anemia, chronic disease 05/24/2016   Recurrent UTI 04/29/2016   TMJ syndrome 03/31/2016   Hearing loss 03/12/2016   Osteopenia 12/05/2015   Hx of thyroid  cancer 06/12/2014   Adenomatous polyp 06/12/2014   Major depressive disorder, recurrent episode, moderate (HCC) 05/29/2014   Grief 04/08/2014   Sjogren's syndrome 03/09/2014   Insomnia due to stress 03/06/2014   Hyperlipidemia 12/28/2012   Medicare annual wellness visit, subsequent 12/28/2012   Essential hypertension 10/02/2011   Iatrogenic hypothyroidism 10/02/2011   Purpura 05/25/2011   PCP:  [facility] Verneita CROME, MD Pharmacy:   [facility] [address], [place] - [address] [place] [address] Phone: [phone] Fax: [phone]     Social Drivers of Health (SDOH) Social History: SDOH Screenings   Food Insecurity: No Food Insecurity (08/28/2024)  Housing: Low Risk (08/28/2024)  Transportation Needs: No Transportation Needs (08/28/2024)  Utilities: Not At Risk (08/28/2024)  Alcohol Screen: Low Risk (04/18/2024)  Depression (PHQ2-9): Low Risk (06/06/2024)  Recent Concern: Depression (PHQ2-9) - Medium Risk (04/18/2024)  Financial Resource Strain: Low Risk (04/18/2024)  Physical Activity: Insufficiently Active (04/18/2024)  Social Connections: Moderately Isolated (08/28/2024)  Stress: No Stress Concern Present (04/18/2024)  Tobacco Use: Low Risk (08/28/2024)  Health Literacy: Adequate Health Literacy (04/18/2024)   SDOH Interventions:     Readmission  Risk Interventions     No data to display

## 2024-08-29 NOTE — Assessment & Plan Note (Signed)
 Hemoglobin down to 7.2 with IV fluid hydration.  Benefits and risk of blood transfusion explained to patient and daughter.  Patient willing to undergo blood transfusion.  Will give 1 unit of packed red blood cells today and recheck hemoglobin tomorrow.

## 2024-08-29 NOTE — Assessment & Plan Note (Addendum)
 Dose of Lasix  given this afternoon with blood transfusion.  No signs of heart failure this morning.

## 2024-08-29 NOTE — Assessment & Plan Note (Signed)
 On Zoloft.

## 2024-08-29 NOTE — Plan of Care (Signed)

## 2024-08-29 NOTE — Assessment & Plan Note (Addendum)
 Sepsis present on admission with fever tachycardia and pneumonia.  Patient with fever of 101 this morning.  Procalcitonin elevated.  Patient on Tamiflu  and empirically started on Rocephin  and Zithromax .  Probably more likely viral pneumonia than bacterial but hard to tell.

## 2024-08-29 NOTE — Assessment & Plan Note (Signed)
Sodium a few points less than normal range.

## 2024-08-29 NOTE — Progress Notes (Signed)
 Occupational Therapy Treatment Patient Details Name: Kim Santiago MRN: [id number] DOB: 27-Apr-1935 Today's Date: 08/29/2024   History of present illness Pt is an 89 y/o F admitted on 08/28/24 after presenting with c/o fever & not feeling well. It tested positive for Flu A. Is also being treated for PNA. PMH: HTN, hypothyroidism, HLD, Sjogren's syndrome, depression, GERD, breast CA, CKD, heart attack, lupus   OT comments  Pt is seated in recliner on arrival. Pleasant and agreeable to OT session with focus on safe performance of ADLs. She denies pain. Pt performed transfers and mobility using RW with supervision to CGA during session with no LOB, cues for RW management and environment/obstacle navigation in small space. Pt required set up to supervision for all UB/LB bathing and dressing tasks while seated on BSC and standing at sink with intermittent unilateral support on sink. VSS and overall pt reporting feeling better today than yesterday. Edu pt/daughter on ECS and safety strategies, e.g. utilizing DME and sitting to perform tasks. No reports of dizziness throughout session although pt is pending a blood transfusion. Will cont to follow to maximize return to PLOF. Pt returned to bed with all needs in place.      If plan is discharge home, recommend the following:  A little help with walking and/or transfers;A little help with bathing/dressing/bathroom;Help with stairs or ramp for entrance;Assist for transportation   Equipment Recommendations  None recommended by OT    Recommendations for Other Services      Precautions / Restrictions Precautions Precautions: Fall Recall of Precautions/Restrictions: Intact Restrictions Weight Bearing Restrictions Per Provider Order: No       Mobility Bed Mobility Overal bed mobility: Needs Assistance Bed Mobility: Sit to Supine       Sit to supine: Supervision   General bed mobility comments: increased time, no physical assist to return to  supine    Transfers Overall transfer level: Needs assistance Equipment used: Rolling walker (2 wheels) Transfers: Sit to/from Stand Sit to Stand: Supervision           General transfer comment: cues for hand placement to stand from recliner, attempted via pulling on RW initially and unable, once pushing up from recliner able to perform with supervision and ambulate to bathroom and back with SBA     Balance Overall balance assessment: Needs assistance Sitting-balance support: Feet supported Sitting balance-Leahy Scale: Good     Standing balance support: Single extremity supported, During functional activity Standing balance-Leahy Scale: Fair Standing balance comment: able to stand at sink and perform LB ADLs with SBA and unilateral support on sink intermittently                           ADL either performed or assessed with clinical judgement   ADL Overall ADL's : Needs assistance/impaired     Grooming: Wash/dry hands;Supervision/safety;Standing;Contact guard assist;Wash/dry face;Applying deodorant;Sitting   Upper Body Bathing: Supervision/ safety;Sitting;Set up   Lower Body Bathing: Supervison/ safety;Set up;Sitting/lateral leans;Sit to/from stand Lower Body Bathing Details (indicate cue type and reason): seated on BSC Upper Body Dressing : Contact guard assist;Sitting Upper Body Dressing Details (indicate cue type and reason): doff/donn gown Lower Body Dressing: Contact guard assist;Sitting/lateral leans;Sit to/from stand Lower Body Dressing Details (indicate cue type and reason): doff/donn mesh underwear and clean pad via lateral leans and figure four seated on [facility] Toilet Transfer: Contact guard assist;BSC/3in1 Toilet Transfer Details (indicate cue type and reason): cues for hand placement  Functional mobility during ADLs: Contact guard assist;Rolling walker (2 wheels);Supervision/safety;Cueing for safety      Extremity/Trunk Assessment               Vision       Perception     Praxis     Communication Communication Communication: No apparent difficulties Factors Affecting Communication: Hearing impaired   Cognition Arousal: Alert Behavior During Therapy: WFL for tasks assessed/performed                                 Following commands: Intact        Cueing   Cueing Techniques: Verbal cues  Exercises Other Exercises Other Exercises: Edu pt and daughter on sitting to perform bathing/dressing tasks to maximize safety and prevent overexertion.    Shoulder Instructions       General Comments  (L forearm IV intact, Droplet Precautions.)    Pertinent Vitals/ Pain       Pain Assessment Pain Assessment: No/denies pain  Home Living                                          Prior Functioning/Environment              Frequency  Min 2X/week        Progress Toward Goals  OT Goals(current goals can now be found in the care plan section)  Progress towards OT goals: Progressing toward goals  Acute Rehab OT Goals Patient Stated Goal: get better OT Goal Formulation: With patient/family Time For Goal Achievement: 09/11/24 Potential to Achieve Goals: Good  Plan      Co-evaluation                 AM-PAC OT 6 Clicks Daily Activity     Outcome Measure   Help from another person eating meals?: A Little Help from another person taking care of personal grooming?: A Little Help from another person toileting, which includes using toliet, bedpan, or urinal?: A Little Help from another person bathing (including washing, rinsing, drying)?: A Little Help from another person to put on and taking off regular upper body clothing?: A Little Help from another person to put on and taking off regular lower body clothing?: A Little 6 Click Score: 18    End of Session Equipment Utilized During Treatment: Rolling walker (2 wheels)  OT Visit Diagnosis: Other abnormalities  of gait and mobility (R26.89);Muscle weakness (generalized) (M62.81)   Activity Tolerance Patient tolerated treatment well   Patient Left with call bell/phone within reach;with family/visitor present;in bed;with bed alarm set   Nurse Communication Mobility status        Time: 8553-8479 OT Time Calculation (min): 34 min  Charges: OT General Charges $OT Visit: 1 Visit OT Treatments $Self Care/Home Management : 23-37 mins  Markice Torbert Chrismon, OTR/L  08/29/2024, 4:08 PM   Talon Witting E Chrismon 08/29/2024, 4:05 PM

## 2024-08-29 NOTE — Assessment & Plan Note (Signed)
 Continue to monitor.  Could be secondary to viral infection.

## 2024-08-29 NOTE — Assessment & Plan Note (Addendum)
On chronic prednisone

## 2024-08-29 NOTE — Assessment & Plan Note (Signed)
 On metoprolol

## 2024-08-29 NOTE — Hospital Course (Signed)
 89 y.o. female with medical history significant for HTN, hypothyroidism, HLD, Sjogren's syndrome, depression, GERD who was brought in from home by EMS for not feeling well and fever since yesterday.  EMS gave patient 1 g of Tylenol  for fever. Patient is alert awake and sleepy not responding to the questions and not able to provide meaningful history.  Patient's daughter was at bedside who advised that patient have sore throat cough and fatigue over the past couple of days.  Patient was being planned for urgent care visit this morning but this morning patient was not speaking anymore and looking confused.  Patient's son-in-law recently had a flu symptoms, thought to be viral bronchitis and was not tested for influenza.  Patient and family was concerned and they brought him to ED instead of urgent care for lethargy, fever and not speaking.   ED Course: Upon arrival to the ED, patient is found to be tachycardic at 113, temperature 103.1, influenza A+, sodium was 132 hemoglobin was 8.1 and hematocrit 23.4, EKG showed sinus tachycardia at 114 bpm no ST elevation, chest x-ray showed possible pneumonia.  CT head with no acute pathology.  Patient was given ceftriaxone  and azithromycin , Tamiflu  and oxygen .  Hospitalist service was consulted for evaluation for admission for acute influenza with possible community-acquired pneumonia.  12/23.  Patient had a fever of 101 this morning.  Hemoglobin dropped down to 7.2 with IV fluid hydration.  Patient and daughter agreeable to blood transfusion today.

## 2024-08-29 NOTE — Progress Notes (Signed)
 " Progress Note   Patient: Kim Santiago FMW:[id number] DOB: 1935-05-13 DOA: 08/28/2024     0 DOS: the patient was seen and examined on 08/29/2024   Brief hospital course: 89 y.o. female with medical history significant for HTN, hypothyroidism, HLD, Sjogren's syndrome, depression, GERD who was brought in from home by EMS for not feeling well and fever since yesterday.  EMS gave patient 1 g of Tylenol  for fever. Patient is alert awake and sleepy not responding to the questions and not able to provide meaningful history.  Patient's daughter was at bedside who advised that patient have sore throat cough and fatigue over the past couple of days.  Patient was being planned for urgent care visit this morning but this morning patient was not speaking anymore and looking confused.  Patient's son-in-law recently had a flu symptoms, thought to be viral bronchitis and was not tested for influenza.  Patient and family was concerned and they brought him to ED instead of urgent care for lethargy, fever and not speaking.   ED Course: Upon arrival to the ED, patient is found to be tachycardic at 113, temperature 103.1, influenza A+, sodium was 132 hemoglobin was 8.1 and hematocrit 23.4, EKG showed sinus tachycardia at 114 bpm no ST elevation, chest x-ray showed possible pneumonia.  CT head with no acute pathology.  Patient was given ceftriaxone  and azithromycin , Tamiflu  and oxygen .  Hospitalist service was consulted for evaluation for admission for acute influenza with possible community-acquired pneumonia.  12/23.  Patient had a fever of 101 this morning.  Hemoglobin dropped down to 7.2 with IV fluid hydration.  Patient and daughter agreeable to blood transfusion today.  Assessment and Plan: * Influenza A with pneumonia Sepsis present on admission with fever tachycardia and pneumonia.  Patient with fever of 101 this morning.  Procalcitonin elevated.  Patient on Tamiflu  and empirically started on Rocephin  and  Zithromax .  Probably more likely viral pneumonia than bacterial but hard to tell.  Anemia of chronic disease Hemoglobin down to 7.2 with IV fluid hydration.  Benefits and risk of blood transfusion explained to patient and daughter.  Patient willing to undergo blood transfusion.  Will give 1 unit of packed red blood cells today and recheck hemoglobin tomorrow.  Thrombocytopenia Continue to monitor.  Could be secondary to viral infection.  Drug-induced systemic lupus erythematosus, unspecified organ involvement status On chronic prednisone   Hyponatremia Sodium a few points less than normal range.   Essential hypertension On metoprolol   Chronic diastolic heart failure (HCC) Dose of Lasix  given this afternoon with blood transfusion.  No signs of heart failure this morning.  Depression On Zoloft         Subjective: Patient feels a little weak.  Had a cough.  Admitted with pneumonia and influenza.  Physical Exam: Vitals:   08/29/24 0906 08/29/24 1104 08/29/24 1540 08/29/24 1614  BP: 133/67 (!) 109/48 (!) 114/57 (!) 115/51  Pulse: 83 96 97 86  Resp: 18 19 19 18   Temp: (!) 101.3 F (38.5 C) 99.8 F (37.7 C) (!) 97.5 F (36.4 C) 98.3 F (36.8 C)  TempSrc: Oral Oral    SpO2: 95% 99% 95% 95%  Weight:       Physical Exam HENT:     Head: Normocephalic.  Eyes:     General: Lids are normal.     Comments: Pale conjunctiva  Cardiovascular:     Rate and Rhythm: Normal rate and regular rhythm.     Heart sounds: Normal heart sounds, S1  normal and S2 normal.  Pulmonary:     Breath sounds: Examination of the right-lower field reveals decreased breath sounds and rhonchi. Examination of the left-lower field reveals decreased breath sounds and rhonchi. Decreased breath sounds and rhonchi present. No wheezing or rales.  Abdominal:     Palpations: Abdomen is soft.     Tenderness: There is no abdominal tenderness.  Musculoskeletal:     Right lower leg: No swelling.     Left lower  leg: No swelling.  Skin:    General: Skin is warm.     Comments: Bruising all over her body  Neurological:     Mental Status: She is alert and oriented to person, place, and time.     Data Reviewed: Hemoglobin 7.2, MCV 109, platelet count 82, white blood count 5.2, creatinine 0.66, sodium 133  Family Communication: Daughter at bedside  Disposition: Status is: Inpatient Remains inpatient appropriate because: With fever of 101 I will watch another day in the hospital.  With hemoglobin dropping down to 7.2 will give IV blood transfusion today.  Planned Discharge Destination: Home with Home Health    Time spent: 28 minutes  Author: Charlie Patterson, MD 08/29/2024 5:00 PM  For on call review [email or web address].  "

## 2024-08-29 NOTE — Progress Notes (Signed)
 Physical Therapy Treatment Patient Details Name: Kim Santiago MRN: [id number] DOB: 1934-12-24 Today's Date: 08/29/2024   History of Present Illness Pt is an 89 y/o F admitted on 08/28/24 after presenting with c/o fever & not feeling well. It tested positive for Flu A. Is also being treated for PNA. PMH: HTN, hypothyroidism, HLD, Sjogren's syndrome, depression, GERD, breast CA, CKD, heart attack, lupus    PT Comments  Pt received up in chair, daughter at bedside. Appears to be feeling better with increased stamina. Ambulated to/from bathroom with single UE support, no assist needed for transfers or gait training in hallway with Rollator. SpO2 remained above 95% on RA upon exertion. Slight increase in HR to 115bpm, returning back to current baseline after 1 minute rest break. Referral to Mobility Specialist for increased activity.   If plan is discharge home, recommend the following: A little help with walking and/or transfers;A little help with bathing/dressing/bathroom;Assist for transportation;Help with stairs or ramp for entrance   Can travel by private vehicle        Equipment Recommendations  None recommended by PT (Pt has a Rollator at home)    Recommendations for Other Services       Precautions / Restrictions Precautions Precautions: Fall Recall of Precautions/Restrictions: Intact Restrictions Weight Bearing Restrictions Per Provider Order: No     Mobility  Bed Mobility               General bed mobility comments: NT up in recliner pre/post session    Transfers Overall transfer level: Needs assistance Equipment used: None Transfers: Sit to/from Stand Sit to Stand: Supervision           General transfer comment:  (Pt able to self transfer from Recliner and toilet in bathroom)    Ambulation/Gait Ambulation/Gait assistance: Contact guard assist, Supervision Gait Distance (Feet):  (200) Assistive device: Rollator (4 wheels) Gait Pattern/deviations:  Decreased step length - right, Decreased step length - left, Decreased stride length, Shuffle Gait velocity: WNL     General Gait Details:  (No LOB, pt steady with Rollator in hall during multiple turns and changes in direction)   Stairs             Wheelchair Mobility     Tilt Bed    Modified Rankin (Stroke Patients Only)       Balance Overall balance assessment: Needs assistance Sitting-balance support: Feet supported Sitting balance-Leahy Scale: Good     Standing balance support: Single extremity supported, During functional activity Standing balance-Leahy Scale: Fair Standing balance comment:  (Pt ambulated to/from bathroom with single UE support on furniture)                            Communication Communication Communication: No apparent difficulties  Cognition Arousal: Alert Behavior During Therapy: WFL for tasks assessed/performed   PT - Cognitive impairments: No apparent impairments                       PT - Cognition Comments:  (Very pleasant and cooperative) Following commands: Intact      Cueing Cueing Techniques: Verbal cues  Exercises      General Comments General comments (skin integrity, edema, etc.):  (L forearm IV intact, Droplet Precautions.)      Pertinent Vitals/Pain Pain Assessment Pain Assessment: No/denies pain    Home Living  Prior Function            PT Goals (current goals can now be found in the care plan section) Acute Rehab PT Goals Patient Stated Goal: get better Progress towards PT goals: Progressing toward goals    Frequency    Min 2X/week      PT Plan      Co-evaluation              AM-PAC PT 6 Clicks Mobility   Outcome Measure  Help needed turning from your back to your side while in a flat bed without using bedrails?: A Little Help needed moving from lying on your back to sitting on the side of a flat bed without using bedrails?:  A Little Help needed moving to and from a bed to a chair (including a wheelchair)?: A Little Help needed standing up from a chair using your arms (e.g., wheelchair or bedside chair)?: A Little Help needed to walk in hospital room?: A Little Help needed climbing 3-5 steps with a railing? : A Little 6 Click Score: 18    End of Session   Activity Tolerance: Patient tolerated treatment well Patient left: in chair;with family/visitor present Nurse Communication: Mobility status PT Visit Diagnosis: Difficulty in walking, not elsewhere classified (R26.2);Other abnormalities of gait and mobility (R26.89);Muscle weakness (generalized) (M62.81)     Time: 1411-1430 PT Time Calculation (min) (ACUTE ONLY): 19 min  Charges:    $Therapeutic Activity: 8-22 mins PT General Charges $$ ACUTE PT VISIT: 1 Visit                    Darice Bohr, PTA  [name] 08/29/2024, 2:46 PM

## 2024-08-30 ENCOUNTER — Other Ambulatory Visit: Payer: Self-pay

## 2024-08-30 DIAGNOSIS — D696 Thrombocytopenia, unspecified: Secondary | ICD-10-CM | POA: Diagnosis not present

## 2024-08-30 DIAGNOSIS — D638 Anemia in other chronic diseases classified elsewhere: Secondary | ICD-10-CM | POA: Diagnosis not present

## 2024-08-30 DIAGNOSIS — J09X1 Influenza due to identified novel influenza A virus with pneumonia: Secondary | ICD-10-CM | POA: Diagnosis not present

## 2024-08-30 LAB — BPAM RBC
Blood Product Expiration Date: 202601192359
ISSUE DATE / TIME: 202512231556
Unit Type and Rh: 5100

## 2024-08-30 LAB — TYPE AND SCREEN
ABO/RH(D): O POS
Antibody Screen: NEGATIVE
Unit division: 0

## 2024-08-30 MED ORDER — OSELTAMIVIR PHOSPHATE 30 MG PO CAPS
30.0000 mg | ORAL_CAPSULE | Freq: Two times a day (BID) | ORAL | 0 refills | Status: AC
  Filled 2024-08-30: qty 6, 3d supply, fill #0

## 2024-08-30 MED ORDER — CEFADROXIL 500 MG PO CAPS
500.0000 mg | ORAL_CAPSULE | Freq: Two times a day (BID) | ORAL | 0 refills | Status: AC
  Filled 2024-08-30: qty 6, 3d supply, fill #0

## 2024-08-30 MED ORDER — AZITHROMYCIN 500 MG PO TABS
500.0000 mg | ORAL_TABLET | Freq: Every day | ORAL | 0 refills | Status: AC
  Filled 2024-08-30: qty 2, 2d supply, fill #0

## 2024-08-30 NOTE — Progress Notes (Signed)
 1058 D/C AVS completed and reviewed with pt. All opportunities for questions answered and clarified. IV removed. Pt will be wheeled down to car at medical mall entrance via wheelchair.  D/c barriers: finish IV abx per MD orders

## 2024-08-30 NOTE — Progress Notes (Signed)
 Mobility Specialist - Progress Note  Pre-mobility: SpO2-96%  During mobility: SpO2-97%  Post-mobility: SPO2-96%   08/30/24 0900  Mobility  Activity Ambulated with assistance;Stood at bedside;Dangled on edge of bed  Level of Assistance Standby assist, set-up cues, supervision of patient - no hands on  Assistive Device Front wheel walker  Distance Ambulated (ft) 180 ft  Range of Motion/Exercises All extremities  Activity Response Tolerated well  Mobility visit 1 Mobility  Mobility Specialist Start Time (ACUTE ONLY) [id number]  Mobility Specialist Stop Time (ACUTE ONLY) 0954  Mobility Specialist Time Calculation (min) (ACUTE ONLY) 16 min   Pt was supine in bed with the HOB elevated on RA with guest in the room upon entry. Pt agreed to mobility. Pt O2 vitals were taken throughout activity as precaution. Pt is able today to get to the EOB independently with bed features. Pt is able today to STS independently with no AD. Pt ambulated well with 2 [employer]. Pt did use recovery break to check vitals. O2 vitals were WNL throughout activity. After activity pt returned to the room back in bed with needs in reach and guest I  the room upon exit.  Clem Rodes Mobility Specialist 08/30/2024, 10:02 AM

## 2024-08-30 NOTE — TOC Transition Note (Signed)
 Transition of Care [facility]) - Discharge Note   Patient Details  Name: Kim Santiago MRN: [id number] Date of Birth: March 14, 1935  Transition of Care [facility]) CM/SW Contact:  Alvaro Louder, LCSW Phone Number: 08/30/2024, 1:18 PM   Clinical Narrative:   LCSWA confirmed with MD that patient is stable for discharge. LCSWA notified the patient and they are in agreement with discharge. LCSWA discussed PT recommendation of HH Patient was agreeable. LCSWA reached out to [facility] admissions coordinator an started service for patient. The patient reported that she would have a family come pick her up at discharge.  TOC signing off  Final next level of care: Home w Home Health Services Barriers to Discharge: No Barriers Identified   Patient Goals and CMS Choice            Discharge Placement              Patient chooses bed at:  (Home) Patient to be transferred to facility by: Family Name of family member notified: Self Patient and family notified of of transfer: 08/30/24  Discharge Plan and Services Additional resources added to the After Visit Summary for                            [facility] Arranged: PT, OT [facility] Agency: [facility] Date [facility] Agency Contacted: 08/30/24      Social Drivers of Health (SDOH) Interventions SDOH Screenings   Food Insecurity: No Food Insecurity (08/28/2024)  Housing: Low Risk (08/28/2024)  Transportation Needs: No Transportation Needs (08/28/2024)  Utilities: Not At Risk (08/28/2024)  Alcohol Screen: Low Risk (04/18/2024)  Depression (PHQ2-9): Low Risk (06/06/2024)  Recent Concern: Depression (PHQ2-9) - Medium Risk (04/18/2024)  Financial Resource Strain: Low Risk (04/18/2024)  Physical Activity: Insufficiently Active (04/18/2024)  Social Connections: Moderately Isolated (08/28/2024)  Stress: No Stress Concern Present (04/18/2024)  Tobacco Use: Low Risk (08/28/2024)  Health Literacy: Adequate Health Literacy (04/18/2024)     Readmission  Risk Interventions     No data to display

## 2024-08-30 NOTE — Discharge Summary (Signed)
 " Physician Discharge Summary   Patient: Kim Santiago MRN: [id number] DOB: Mar 02, 1935  Admit date:     08/28/2024  Discharge date: 08/30/2024  Discharge Physician: Murvin Mana   PCP: Marylynn Verneita CROME, MD   Recommendations at discharge:   Follow-up with PCP in 1 week. Check a CBC in 1 week.  Discharge Diagnoses: Principal Problem:   Influenza A with pneumonia Active Problems:   Anemia of chronic disease   Thrombocytopenia   Drug-induced systemic lupus erythematosus, unspecified organ involvement status   Essential hypertension   Hyponatremia   Chronic diastolic heart failure (HCC)   Depression   Purpura   History of CVA with residual deficit   Sepsis (HCC)  Resolved Problems:   Major depressive disorder, recurrent episode, moderate [facility])  Hospital Course: 89 y.o. female with medical history significant for HTN, hypothyroidism, HLD, Sjogren's syndrome, depression, GERD who was brought in from home by EMS for not feeling well and fever since yesterday.  EMS gave patient 1 g of Tylenol  for fever. Patient is alert awake and sleepy not responding to the questions and not able to provide meaningful history.  Patient's daughter was at bedside who advised that patient have sore throat cough and fatigue over the past couple of days.  Patient was being planned for urgent care visit this morning but this morning patient was not speaking anymore and looking confused.  Patient's son-in-law recently had a flu symptoms, thought to be viral bronchitis and was not tested for influenza.  Patient and family was concerned and they brought him to ED instead of urgent care for lethargy, fever and not speaking.   ED Course: Upon arrival to the ED, patient is found to be tachycardic at 113, temperature 103.1, influenza A+, sodium was 132 hemoglobin was 8.1 and hematocrit 23.4, EKG showed sinus tachycardia at 114 bpm no ST elevation, chest x-ray showed possible pneumonia.  CT head with no acute pathology.   Patient was given ceftriaxone  and azithromycin , Tamiflu  and oxygen .  Hospitalist service was consulted for evaluation for admission for acute influenza with possible community-acquired pneumonia.  12/23.  Patient had a fever of 101 this morning.  Hemoglobin dropped down to 7.2 with IV fluid hydration.  Patient and daughter agreeable to blood transfusion today.  12/24.  Patient condition has improved, currently patient on he has some upper respiratory symptoms.  Medically stable for discharge.  Assessment and Plan: * Influenza A with pneumonia Secondary bacterial pneumonia. Sepsis present on admission with fever tachycardia and pneumonia.  Patient with fever of 101 this morning.  Procalcitonin elevated.  Patient on Tamiflu  and empirically started on Rocephin  and Zithromax .   Patient also has significant elevation of procalcitonin level, consistent with secondary bacterial pneumonia from influenza A. Condition has improved, patient essentially has no symptoms.  Seen by PT/OT, recommended home health.  At this point, will continue finishing up 5 days antibiotics and Tamiflu .  Anemia of chronic disease Hemoglobin down to 7.2 with IV fluid hydration.  Benefits and risk of blood transfusion explained to patient and daughter.  Patient had a 1 unit of PRBC transfused on 12/23.  Thrombocytopenia Appears secondary to influenza, follow with PCP to recheck CBC in 1 week.  Drug-induced systemic lupus erythematosus, unspecified organ involvement status On chronic prednisone , I will hold methotrexate  due to active infection.  Follow-up with PCP to restart.  Hyponatremia Mild.   Essential hypertension Resume home treatment.  Chronic diastolic heart failure (HCC) Resume home treatment.  Depression On Zoloft   Consultants: None Procedures performed: None  Disposition: Home health Diet recommendation:  Discharge Diet Orders (From admission, onward)     Start     Ordered   08/30/24  0000  Diet - low sodium heart healthy        08/30/24 1016           Cardiac diet DISCHARGE MEDICATION: Allergies as of 08/30/2024       Reactions   Naprosyn [naproxen] Swelling   Amoxicillin  Rash   Tolerates first and third generation cephalosporins   Azathioprine  Nausea And Vomiting   Severe vomiting   Codeine Nausea And Vomiting   Hydroxychloroquine Hives, Nausea And Vomiting   Mycophenolate Mofetil Nausea Only   Orudis [ketoprofen] Hives   Sulfa Antibiotics Rash   Sulfathiazole Rash        Medication List     STOP taking these medications    cholecalciferol  1000 units tablet Commonly known as: VITAMIN D    methotrexate  2.5 MG tablet Commonly known as: RHEUMATREX       TAKE these medications    atorvastatin  20 MG tablet Commonly known as: LIPITOR TAKE 1 TABLET BY MOUTH EVERY DAY AT 6 PM   azithromycin  500 MG tablet Commonly known as: Zithromax  Take 1 tablet (500 mg total) by mouth daily for 2 days. Start taking on: August 31, 2024   Calcium  Carbonate-Vitamin D  600-200 MG-UNIT Tabs Take 2 tablets by mouth daily.   cefadroxil  500 MG capsule Commonly known as: DURICEF Take 1 capsule (500 mg total) by mouth 2 (two) times daily for 3 days. Start taking on: August 31, 2024   CRANBERRY PO Take 1 capsule by mouth 2 (two) times daily.   fluticasone 50 MCG/ACT nasal spray Commonly known as: FLONASE Place 1 spray into both nostrils daily. What changed:  when to take this reasons to take this   folic acid  1 MG tablet Commonly known as: FOLVITE  Take 2 mg by mouth daily.   furosemide  20 MG tablet Commonly known as: LASIX  TAKE 1 TABLET BY MOUTH EVERY OTHER DAY AS NEEDED   levothyroxine  75 MCG tablet Commonly known as: SYNTHROID  TAKE 1 TABLET BY MOUTH (75MCG TOTAL) DAILY   metoprolol  succinate 25 MG 24 hr tablet Commonly known as: TOPROL -XL TAKE 1 TABLET BY MOUTH EVERY DAY   omeprazole  20 MG capsule Commonly known as: PRILOSEC TAKE 1  CAPSULE (20 MG TOTAL) BY MOUTH 2 (TWO) TIMES DAILY BEFORE A MEAL. What changed:  when to take this reasons to take this   ondansetron  4 MG tablet Commonly known as: ZOFRAN  Take 1 tablet (4 mg total) by mouth every 6 (six) hours. What changed:  when to take this reasons to take this   oseltamivir  30 MG capsule Commonly known as: TAMIFLU  Take 1 capsule (30 mg total) by mouth 2 (two) times daily for 3 days.   polyethylene glycol 17 g packet Commonly known as: MIRALAX  / GLYCOLAX  Take 17 g by mouth daily.   predniSONE  2.5 MG tablet Commonly known as: DELTASONE  Take 2.5 mg by mouth daily.   PROBIOTIC PO Take 1 tablet by mouth daily.   sertraline  25 MG tablet Commonly known as: ZOLOFT  TAKE 1 TABLET (25 MG TOTAL) BY MOUTH DAILY.   Sodium Fluoride 5000 PPM 1.1 % Pste Generic drug: Sodium Fluoride See admin instructions.   tiZANidine  2 MG tablet Commonly known as: ZANAFLEX  Take 1 tablet (2 mg total) by mouth every 8 (eight) hours as needed for muscle spasms.   triamcinolone  cream 0.1 %  Commonly known as: KENALOG  APPLY TO AFFECTED AREA TWICE A DAY        Contact information for after-discharge care     Home Medical Care     [facility]) .   Service: Home Health Services Contact information: [address] [place]  [phone]                    Discharge Exam: Filed Weights   08/28/24 0847  Weight: 61.8 kg   General exam: Appears calm and comfortable  Respiratory system: Clear to auscultation. Respiratory effort normal. Cardiovascular system: S1 & S2 heard, RRR. No JVD, murmurs, rubs, gallops or clicks. No pedal edema. Gastrointestinal system: Abdomen is nondistended, soft and nontender. No organomegaly or masses felt. Normal bowel sounds heard. Central nervous system: Alert and oriented. No focal neurological deficits. Extremities: Symmetric 5 x 5 power. Skin: No rashes, lesions or ulcers Psychiatry:  Judgement and insight appear normal. Mood & affect appropriate.    Condition at discharge: good  The results of significant diagnostics from this hospitalization (including imaging, microbiology, ancillary and laboratory) are listed below for reference.   Imaging Studies: CT HEAD WO CONTRAST ( ) Result Date: 08/28/2024 EXAM: CT HEAD WITHOUT CONTRAST 08/28/2024 10:22:47 AM TECHNIQUE: CT of the head was performed without the administration of intravenous contrast. Automated exposure control, iterative reconstruction, and/or weight based adjustment of the mA/kV was utilized to reduce the radiation dose to as low as reasonably achievable. COMPARISON: 04/25/2024 CLINICAL HISTORY: AMS FINDINGS: BRAIN AND VENTRICLES: No acute hemorrhage. No evidence of acute infarct. No hydrocephalus. No extra-axial collection. No mass effect or midline shift. Age-related cerebral volume loss and moderate to severe periventricular and deep cerebral white matter disease. Moderate calcific atheromatous disease within carotid siphons. ORBITS: No acute abnormality. Status post bilateral lens replacement. SINUSES: Mild scattered ethmoid sinus mucosal thickening. SOFT TISSUES AND SKULL: No acute soft tissue abnormality. No skull fracture. IMPRESSION: 1. No acute intracranial abnormality. 2. Age-related cerebral volume loss and moderate to severe periventricular and deep cerebral white matter disease. 3. Moderate calcific atherosclerotic disease of the carotid siphons. 4. Mild scattered ethmoid sinus mucosal thickening. Electronically signed by: Evalene Coho MD 08/28/2024 10:47 AM EST RP Workstation: [id number]   DG Chest Port 1 View Result Date: 08/28/2024 CLINICAL DATA:  Possible sepsis. EXAM: PORTABLE CHEST 1 VIEW COMPARISON:  05/13/2021. FINDINGS: The heart is enlarged and the mediastinal contour is within normal limits. There is atherosclerotic calcification of the aorta. Patchy airspace disease is present at the lung  bases. No effusion or pneumothorax is seen. No acute osseous abnormality. IMPRESSION: Patchy airspace disease at the lung bases, possible atelectasis or infiltrate. Electronically Signed   By: Leita Birmingham M.D.   On: 08/28/2024 10:13    Microbiology: Results for orders placed or performed during the hospital encounter of 08/28/24  Resp panel by RT-PCR (RSV, Flu A&B, Covid) Anterior Nasal Swab     Status: Abnormal   Collection Time: 08/28/24  8:43 AM   Specimen: Anterior Nasal Swab  Result Value Ref Range Status   SARS Coronavirus 2 by RT PCR NEGATIVE NEGATIVE Final    Comment: (NOTE) SARS-CoV-2 target nucleic acids are NOT DETECTED.  The SARS-CoV-2 RNA is generally detectable in upper respiratory specimens during the acute phase of infection. The lowest concentration of SARS-CoV-2 viral copies this assay can detect is 138 copies/mL. A negative result does not preclude SARS-Cov-2 infection and should not be used as the sole  basis for treatment or other patient management decisions. A negative result may occur with  improper specimen collection/handling, submission of specimen other than nasopharyngeal swab, presence of viral mutation(s) within the areas targeted by this assay, and inadequate number of viral copies(<138 copies/mL). A negative result must be combined with clinical observations, patient history, and epidemiological information. The expected result is Negative.  Fact Sheet for Patients:  bloggercourse.com  Fact Sheet for Healthcare Providers:  seriousbroker.it  This test is no t yet approved or cleared by the [place]  FDA and  has been authorized for detection and/or diagnosis of SARS-CoV-2 by FDA under an Emergency Use Authorization (EUA). This EUA will remain  in effect (meaning this test can be used) for the duration of the COVID-19 declaration under Section 564(b)(1) of the Act, 21 U.S.C.section  360bbb-3(b)(1), unless the authorization is terminated  or revoked sooner.       Influenza A by PCR POSITIVE (A) NEGATIVE Final   Influenza B by PCR NEGATIVE NEGATIVE Final    Comment: (NOTE) The Xpert Xpress SARS-CoV-2/FLU/RSV plus assay is intended as an aid in the diagnosis of influenza from Nasopharyngeal swab specimens and should not be used as a sole basis for treatment. Nasal washings and aspirates are unacceptable for Xpert Xpress SARS-CoV-2/FLU/RSV testing.  Fact Sheet for Patients: bloggercourse.com  Fact Sheet for Healthcare Providers: seriousbroker.it  This test is not yet approved or cleared by the [place]  FDA and has been authorized for detection and/or diagnosis of SARS-CoV-2 by FDA under an Emergency Use Authorization (EUA). This EUA will remain in effect (meaning this test can be used) for the duration of the COVID-19 declaration under Section 564(b)(1) of the Act, 21 U.S.C. section 360bbb-3(b)(1), unless the authorization is terminated or revoked.     Resp Syncytial Virus by PCR NEGATIVE NEGATIVE Final    Comment: (NOTE) Fact Sheet for Patients: bloggercourse.com  Fact Sheet for Healthcare Providers: seriousbroker.it  This test is not yet approved or cleared by the [place]  FDA and has been authorized for detection and/or diagnosis of SARS-CoV-2 by FDA under an Emergency Use Authorization (EUA). This EUA will remain in effect (meaning this test can be used) for the duration of the COVID-19 declaration under Section 564(b)(1) of the Act, 21 U.S.C. section 360bbb-3(b)(1), unless the authorization is terminated or revoked.  Performed at [facility], [address] [place], [place] [address]   Blood Culture (routine x 2)     Status: None (Preliminary result)   Collection Time: 08/28/24  9:22 AM   Specimen: BLOOD RIGHT ARM  Result  Value Ref Range Status   Specimen Description BLOOD RIGHT ARM  Final   Special Requests   Final    BOTTLES DRAWN AEROBIC AND ANAEROBIC Blood Culture results may not be optimal due to an inadequate volume of blood received in culture bottles   Culture   Final    NO GROWTH < 24 HOURS Performed at [facility], [address] [place], [place] [address]    Report Status PENDING  Incomplete  Blood Culture (routine x 2)     Status: None (Preliminary result)   Collection Time: 08/28/24  9:22 AM   Specimen: BLOOD LEFT ARM  Result Value Ref Range Status   Specimen Description BLOOD LEFT ARM  Final   Special Requests   Final    BOTTLES DRAWN AEROBIC AND ANAEROBIC Blood Culture results may not be optimal due to an inadequate volume of blood received in culture bottles  Culture   Final    NO GROWTH < 24 HOURS Performed at [facility], [address] [place], [place] [address]    Report Status PENDING  Incomplete  Group A Strep by PCR ([facility] Only)     Status: None   Collection Time: 08/28/24  9:22 AM   Specimen: Throat; Sterile Swab  Result Value Ref Range Status   Group A Strep by PCR NOT DETECTED NOT DETECTED Final    Comment: Performed at [facility], [address] [place], [place] [address]    Labs: CBC: Recent Labs  Lab 08/28/24 0843 08/29/24 0558  WBC 4.9 5.2  NEUTROABS 3.8  --   HGB 8.1* 7.2*  HCT 23.4* 21.0*  MCV 109.9* 109.4*  PLT 99* 82*   Basic Metabolic Panel: Recent Labs  Lab 08/28/24 0843 08/29/24 0558  NA 132* 133*  K 4.1 4.1  CL 99 102  CO2 25 27  GLUCOSE 120* 91  BUN 17 15  CREATININE 0.65 0.66  CALCIUM  7.7* 7.5*   Liver Function Tests: Recent Labs  Lab 08/28/24 0843 08/29/24 0558  AST 46* 37  ALT 17 16  ALKPHOS 94 87  BILITOT 0.7 0.6  PROT 5.4* 5.2*  ALBUMIN 3.4* 3.2*   CBG: No results for input(s): GLUCAP in the last 168 hours.  Discharge time spent: 35 minutes.  Signed: Murvin Mana, MD [facility] 08/30/2024 "

## 2024-09-01 ENCOUNTER — Telehealth: Payer: Self-pay

## 2024-09-01 NOTE — Transitions of Care (Post Inpatient/ED Visit) (Signed)
 "  09/01/2024  Name: Kim Santiago MRN: [id number] DOB: 10/02/1934  Today's TOC FU Call Status: Today's TOC FU Call Status:: Successful TOC FU Call Completed TOC FU Call Complete Date: 09/01/24  Patient's Name and Date of Birth confirmed. Name, DOB (daughter/ dpr [name] on call with patient assisting with health history)  Transition Care Management Follow-up Telephone Call Date of Discharge: 08/30/24 Discharge [facility]) Type of Discharge: Inpatient Admission Primary Inpatient Discharge Diagnosis:: influenza A with pneumonia How have you been since you were released from the hospital?: Same Any questions or concerns?: No  Items Reviewed: Did you receive and understand the discharge instructions provided?: Yes Medications obtained,verified, and reconciled?: Yes (Medications Reviewed) Any new allergies since your discharge?: No Dietary orders reviewed?: Yes Type of Diet Ordered:: regular diet Do you have support at home?: Yes People in Home [RPT]: child(ren), adult Name of Support/Comfort Primary Source: [name]  Medications Reviewed Today: Medications Reviewed Today     Reviewed by Eileene Kisling E, RN (Registered Nurse) on 09/01/24 at [phone]  Med List Status: <None>   Medication Order Taking? Sig Documenting Provider Last Dose Status Informant  acetaminophen  (TYLENOL ) 500 MG tablet [id number] Yes Take 500 mg by mouth every 4 (four) hours as needed. [provider]  Active   atorvastatin  (LIPITOR) 20 MG tablet [id number] Yes TAKE 1 TABLET BY MOUTH EVERY DAY AT 6 PM Tullo, Teresa L, MD  Active Self, Child  azithromycin  (ZITHROMAX ) 500 MG tablet [id number] Yes Take 1 tablet (500 mg total) by mouth daily for 2 days. Laurita Pillion, MD  Active   Calcium  Carbonate-Vitamin D  600-200 MG-UNIT TABS [id number]  Take 2 tablets by mouth daily.  Patient not taking: Reported on 09/01/2024   [provider]  Active Self, Child   cefadroxil  (DURICEF) 500 MG capsule [id number] Yes Take 1 capsule (500 mg total) by mouth 2 (two) times daily for 3 days. Laurita Pillion, MD  Active   CRANBERRY PO [id number] Yes Take 1 capsule by mouth 2 (two) times daily. [provider]  Active Self, Child  fluticasone (FLONASE) 50 MCG/ACT nasal spray [id number] Yes Place 1 spray into both nostrils daily.  Patient taking differently: Place 1 spray into both nostrils daily as needed.   [provider]  Active Self, Child  folic acid  (FOLVITE ) 1 MG tablet [id number] Yes Take 2 mg by mouth daily. [provider]  Active Self, Child           Med Note ETTIE, KATHERINE   Fri Jan 23, 2022  1:12 PM) Pt states 3mg  a day.  furosemide  (LASIX ) 20 MG tablet [id number] Yes TAKE 1 TABLET BY MOUTH EVERY OTHER DAY AS NEEDED Marylynn Verneita CROME, MD  Active Self, Child  levothyroxine  (SYNTHROID ) 75 MCG tablet [id number] Yes TAKE 1 TABLET BY MOUTH ( TOTAL) DAILY Tullo, Teresa L, MD  Active Self, Child  metoprolol  succinate (TOPROL -XL) 25 MG 24 hr tablet [id number] Yes TAKE 1 TABLET BY MOUTH EVERY DAY Tullo, Teresa L, MD  Active Self, Child  omeprazole  (PRILOSEC) 20 MG capsule [id number] Yes TAKE 1 CAPSULE (20 MG TOTAL) BY MOUTH 2 (TWO) TIMES DAILY BEFORE A MEAL.  Patient taking differently: Take 20 mg by mouth 2 (two) times daily as needed.   Marylynn Verneita CROME, MD  Active Self, Child  ondansetron  (ZOFRAN ) 4 MG tablet [id number] Yes Take 1 tablet (4 mg total) by mouth every 6 (six) hours.  Patient taking differently: Take 4 mg by  mouth every 6 (six) hours as needed.   Marylynn Verneita CROME, MD  Active Self, Child  oseltamivir  (TAMIFLU ) 30 MG capsule [id number] Yes Take 1 capsule (30 mg total) by mouth 2 (two) times daily for 3 days. Laurita Pillion, MD  Active   polyethylene glycol (MIRALAX  / GLYCOLAX ) packet [id number] Yes Take 17 g by mouth daily. [provider]  Active Self, Child  predniSONE  (DELTASONE ) 2.5 MG tablet [id number] Yes  Take 2.5 mg by mouth daily. [provider]  Active Self, Child  Probiotic Product (PROBIOTIC PO) [id number] Yes Take 1 tablet by mouth daily. [provider]  Active Self, Child  sertraline  (ZOLOFT ) 25 MG tablet [id number] Yes TAKE 1 TABLET (25 MG TOTAL) BY MOUTH DAILY. Marylynn Verneita CROME, MD  Active Self, Child  SODIUM FLUORIDE 5000 PPM 1.1 % PSTE [id number] Yes See admin instructions. [provider]  Active Self, Child  tiZANidine  (ZANAFLEX ) 2 MG tablet [id number] Yes Take 1 tablet (2 mg total) by mouth every 8 (eight) hours as needed for muscle spasms. Marylynn Verneita CROME, MD  Active Self, Child  triamcinolone  cream (KENALOG ) 0.1 % [id number] Yes APPLY TO AFFECTED AREA TWICE A DAY [provider]  Active Self, Child            Home Care and Equipment/Supplies: Were Home Health Services Ordered?: Yes Name of Home Health Agency:: [facility] Has Agency set up a time to come to your home?: No (Informed daughter that contact phone number for [facility] is on patients discharge summary. Advised to call them if she doesn't hear from them in the next day or so.) EMR reviewed for Home Health Orders: Orders present/patient has not received call (refer to CM for follow-up) Any new equipment or medical supplies ordered?: No  Functional Questionnaire: Do you need assistance with bathing/showering or dressing?: Yes Do you need assistance with meal preparation?: Yes Do you need assistance with eating?: No Do you have difficulty maintaining continence: Yes Do you need assistance with getting out of bed/getting out of a chair/moving?: Yes Do you have difficulty managing or taking your medications?: Yes  Follow up appointments reviewed: PCP Follow-up appointment confirmed?: No (offered to arrange hospital follow up visit with primary provider. Daughter said she will call and schedule) Specialist Hospital Follow-up appointment confirmed?: NA Do you need  transportation to your follow-up appointment?: No Do you understand care options if your condition(s) worsen?: Yes-patient verbalized understanding  SDOH Interventions Today    Flowsheet Row Most Recent Value  SDOH Interventions   Food Insecurity Interventions Intervention Not Indicated  Housing Interventions Intervention Not Indicated  Transportation Interventions Intervention Not Indicated  Utilities Interventions Intervention Not Indicated  Depression Interventions/Treatment  --  [depression and anxiety screening results sent to patients primary care provider.]   Discussed and offered 30 day TOC program.  Patient / daughter declined services.   The patient has been provided with contact information for the care management team and has been advised to call with any health -related questions or concerns.  The patient verbalized understanding with current plan of care.  The patient is directed to their insurance card regarding availability of benefits coverage.    Arvin Seip RN, BSN, CCM [employer], Population Health Case Manager Phone: [phone]  "

## 2024-09-01 NOTE — Patient Instructions (Signed)
 Visit Information  Thank you for taking time to visit with me today. Please don't hesitate to contact me if I can be of assistance to you.  Patient instructions: Practice deep breathing and coughing exercises ever 2-4 hours Keep yourself hydrated Use humidifier or steam inhalation as tolerated Take medications as prescribed and take antibiotics until completed  Schedule hospital follow up visit with primary care provider Use good hand hygiene Take rest periods between activity Notify provider for worsening or new symptoms Seek emergency medical services for severe symptoms of breathing and/ or chest pain or high fever   Patient verbalizes understanding of instructions and care plan provided today and agrees to view in MyChart. Active MyChart status and patient understanding of how to access instructions and care plan via MyChart confirmed with patient.     The patient has been provided with contact information for the care management team and has been advised to call with any health related questions or concerns.   Please call the care guide team at [phone] if you need to cancel or reschedule your appointment.   Please call the Suicide and Crisis Lifeline: 988 call the [place]  National Suicide Prevention Lifeline: 660 025 2560 or TTY: 204 658 6341 TTY (813)416-8302) to talk to a trained counselor call 1-800-273-TALK (toll free, 24 hour hotline) if you are experiencing a Mental Health or Behavioral Health Crisis or need someone to talk to.  Arvin Seip RN, BSN, CCM [employer], Population Health Case Manager Phone: [phone]

## 2024-09-01 NOTE — Telephone Encounter (Signed)
 Copied from CRM #[id number]. Topic: Clinical - Home Health Verbal Orders >> Sep 01, 2024  3:42 PM Berneda FALCON wrote: Caller/Agency: Holli with [facility] Callback Number: [phone] ask for nurse Debbie Service Requested: Occupational Therapy and Physical Therapy Frequency: Delay of service until 09/08/2024 due to discharge from hospital Any new concerns about the patient? No, have not met patient

## 2024-09-01 NOTE — Telephone Encounter (Signed)
 Verbal orders given

## 2024-09-02 LAB — CULTURE, BLOOD (ROUTINE X 2)
Culture: NO GROWTH
Culture: NO GROWTH

## 2024-09-05 ENCOUNTER — Telehealth: Payer: Self-pay

## 2024-09-05 NOTE — Telephone Encounter (Signed)
 Copied from CRM [id number]. Topic: Clinical - Home Health Verbal Orders >> Sep 05, 2024  3:31 PM Chasity T wrote: Caller/Agency: [place] Callback Number: [phone] Service Requested: Physical Therapy  Frequency: patient and family requested to start care on Jan 2nd 2023, delay in care. Any new concerns about the patient? No

## 2024-09-06 ENCOUNTER — Emergency Department

## 2024-09-06 ENCOUNTER — Inpatient Hospital Stay: Admitting: Internal Medicine

## 2024-09-06 ENCOUNTER — Other Ambulatory Visit: Payer: Self-pay

## 2024-09-06 ENCOUNTER — Observation Stay
Admission: EM | Admit: 2024-09-06 | Discharge: 2024-09-12 | Disposition: A | Discharge status: Home / Self Care | Attending: Internal Medicine | Admitting: Internal Medicine

## 2024-09-06 DIAGNOSIS — R52 Pain, unspecified: Secondary | ICD-10-CM

## 2024-09-06 DIAGNOSIS — Y92019 Unspecified place in single-family (private) house as the place of occurrence of the external cause: Secondary | ICD-10-CM | POA: Diagnosis not present

## 2024-09-06 DIAGNOSIS — I5032 Chronic diastolic (congestive) heart failure: Secondary | ICD-10-CM | POA: Diagnosis not present

## 2024-09-06 DIAGNOSIS — E871 Hypo-osmolality and hyponatremia: Secondary | ICD-10-CM | POA: Insufficient documentation

## 2024-09-06 DIAGNOSIS — S32040A Wedge compression fracture of fourth lumbar vertebra, initial encounter for closed fracture: Principal | ICD-10-CM

## 2024-09-06 DIAGNOSIS — W19XXXA Unspecified fall, initial encounter: Secondary | ICD-10-CM | POA: Diagnosis not present

## 2024-09-06 DIAGNOSIS — R8281 Pyuria: Secondary | ICD-10-CM | POA: Insufficient documentation

## 2024-09-06 DIAGNOSIS — D649 Anemia, unspecified: Secondary | ICD-10-CM | POA: Diagnosis not present

## 2024-09-06 DIAGNOSIS — E034 Atrophy of thyroid (acquired): Secondary | ICD-10-CM

## 2024-09-06 DIAGNOSIS — F32A Depression, unspecified: Secondary | ICD-10-CM | POA: Insufficient documentation

## 2024-09-06 DIAGNOSIS — Z79899 Other long term (current) drug therapy: Secondary | ICD-10-CM | POA: Insufficient documentation

## 2024-09-06 DIAGNOSIS — D696 Thrombocytopenia, unspecified: Secondary | ICD-10-CM | POA: Diagnosis not present

## 2024-09-06 DIAGNOSIS — S32049A Unspecified fracture of fourth lumbar vertebra, initial encounter for closed fracture: Principal | ICD-10-CM | POA: Insufficient documentation

## 2024-09-06 DIAGNOSIS — M320 Drug-induced systemic lupus erythematosus: Secondary | ICD-10-CM | POA: Insufficient documentation

## 2024-09-06 DIAGNOSIS — Z4789 Encounter for other orthopedic aftercare: Secondary | ICD-10-CM | POA: Diagnosis not present

## 2024-09-06 DIAGNOSIS — R059 Cough, unspecified: Secondary | ICD-10-CM | POA: Diagnosis not present

## 2024-09-06 DIAGNOSIS — R509 Fever, unspecified: Secondary | ICD-10-CM | POA: Diagnosis not present

## 2024-09-06 DIAGNOSIS — I110 Hypertensive heart disease with heart failure: Secondary | ICD-10-CM | POA: Diagnosis not present

## 2024-09-06 DIAGNOSIS — M48061 Spinal stenosis, lumbar region without neurogenic claudication: Secondary | ICD-10-CM | POA: Diagnosis not present

## 2024-09-06 DIAGNOSIS — M545 Low back pain, unspecified: Secondary | ICD-10-CM | POA: Diagnosis present

## 2024-09-06 LAB — CBC WITH DIFFERENTIAL/PLATELET
Abs Immature Granulocytes: 0.07 K/uL (ref 0.00–0.07)
Basophils Absolute: 0.1 K/uL (ref 0.0–0.1)
Basophils Relative: 2 %
Eosinophils Absolute: 0.2 K/uL (ref 0.0–0.5)
Eosinophils Relative: 4 %
HCT: 25.9 % — ABNORMAL LOW (ref 36.0–46.0)
Hemoglobin: 8.8 g/dL — ABNORMAL LOW (ref 12.0–15.0)
Immature Granulocytes: 2 %
Lymphocytes Relative: 37 %
Lymphs Abs: 1.3 K/uL (ref 0.7–4.0)
MCH: 35.2 pg — ABNORMAL HIGH (ref 26.0–34.0)
MCHC: 34 g/dL (ref 30.0–36.0)
MCV: 103.6 fL — ABNORMAL HIGH (ref 80.0–100.0)
Monocytes Absolute: 0.4 K/uL (ref 0.1–1.0)
Monocytes Relative: 12 %
Neutro Abs: 1.5 K/uL — ABNORMAL LOW (ref 1.7–7.7)
Neutrophils Relative %: 43 %
Platelets: 195 K/uL (ref 150–400)
RBC: 2.5 MIL/uL — ABNORMAL LOW (ref 3.87–5.11)
RDW: 17.3 % — ABNORMAL HIGH (ref 11.5–15.5)
Smear Review: NORMAL
WBC: 3.5 K/uL — ABNORMAL LOW (ref 4.0–10.5)
nRBC: 0 % (ref 0.0–0.2)

## 2024-09-06 LAB — BASIC METABOLIC PANEL WITH GFR
Anion gap: 8 (ref 5–15)
BUN: 11 mg/dL (ref 8–23)
CO2: 27 mmol/L (ref 22–32)
Calcium: 9 mg/dL (ref 8.9–10.3)
Chloride: 99 mmol/L (ref 98–111)
Creatinine, Ser: 0.71 mg/dL (ref 0.44–1.00)
GFR, Estimated: >60 mL/min
Glucose, Bld: 93 mg/dL (ref 70–99)
Potassium: 4.2 mmol/L (ref 3.5–5.1)
Sodium: 134 mmol/L — ABNORMAL LOW (ref 135–145)

## 2024-09-06 LAB — URINALYSIS, ROUTINE W REFLEX MICROSCOPIC
Bacteria, UA: NONE SEEN
Bilirubin Urine: NEGATIVE
Glucose, UA: NEGATIVE mg/dL
Hgb urine dipstick: NEGATIVE
Ketones, ur: NEGATIVE mg/dL
Nitrite: NEGATIVE
Protein, ur: NEGATIVE mg/dL
Specific Gravity, Urine: 1.012 (ref 1.005–1.030)
WBC, UA: >50 WBC/hpf (ref 0–5)
pH: 7 (ref 5.0–8.0)

## 2024-09-06 MED ORDER — METOPROLOL SUCCINATE ER 25 MG PO TB24
25.0000 mg | ORAL_TABLET | Freq: Every day | ORAL | Status: DC
  Administered 2024-09-06 – 2024-09-11 (×6): 25 mg via ORAL
  Filled 2024-09-06 (×6): qty 1

## 2024-09-06 MED ORDER — FENTANYL CITRATE (PF) 50 MCG/ML IJ SOSY
25.0000 ug | PREFILLED_SYRINGE | Freq: Once | INTRAMUSCULAR | Status: AC
  Administered 2024-09-06: 25 ug via INTRAVENOUS
  Filled 2024-09-06: qty 1

## 2024-09-06 MED ORDER — SERTRALINE HCL 50 MG PO TABS
25.0000 mg | ORAL_TABLET | Freq: Every day | ORAL | Status: DC
  Administered 2024-09-06 – 2024-09-11 (×6): 25 mg via ORAL
  Filled 2024-09-06 (×6): qty 1

## 2024-09-06 MED ORDER — MORPHINE SULFATE (PF) 4 MG/ML IV SOLN
4.0000 mg | Freq: Once | INTRAVENOUS | Status: AC
  Administered 2024-09-06: 4 mg via INTRAVENOUS
  Filled 2024-09-06: qty 1

## 2024-09-06 MED ORDER — ACETAMINOPHEN 325 MG PO TABS
650.0000 mg | ORAL_TABLET | Freq: Four times a day (QID) | ORAL | Status: DC | PRN
  Administered 2024-09-07 – 2024-09-12 (×7): 650 mg via ORAL
  Filled 2024-09-06 (×7): qty 2

## 2024-09-06 MED ORDER — ACETAMINOPHEN 650 MG RE SUPP: 650.0000 mg | Freq: Four times a day (QID) | RECTAL | Status: DC | PRN

## 2024-09-06 MED ORDER — ATORVASTATIN CALCIUM 20 MG PO TABS
20.0000 mg | ORAL_TABLET | Freq: Every day | ORAL | Status: DC
  Administered 2024-09-06 – 2024-09-11 (×6): 20 mg via ORAL
  Filled 2024-09-06 (×6): qty 1

## 2024-09-06 MED ORDER — GUAIFENESIN-DM 100-10 MG/5ML PO SYRP
5.0000 mL | ORAL_SOLUTION | ORAL | Status: DC | PRN
  Administered 2024-09-06 – 2024-09-07 (×2): 5 mL via ORAL
  Filled 2024-09-06 (×2): qty 10

## 2024-09-06 MED ORDER — ENOXAPARIN SODIUM 40 MG/0.4ML IJ SOSY
40.0000 mg | PREFILLED_SYRINGE | INTRAMUSCULAR | Status: DC
  Administered 2024-09-07 – 2024-09-12 (×6): 40 mg via SUBCUTANEOUS
  Filled 2024-09-06 (×6): qty 0.4

## 2024-09-06 MED ORDER — MORPHINE SULFATE (PF) 2 MG/ML IV SOLN: 2.0000 mg | INTRAVENOUS | Status: DC | PRN

## 2024-09-06 MED ORDER — OXYCODONE-ACETAMINOPHEN 5-325 MG PO TABS: 1.0000 | ORAL_TABLET | Freq: Once | ORAL | Status: DC

## 2024-09-06 MED ORDER — ONDANSETRON HCL 4 MG PO TABS: 4.0000 mg | ORAL_TABLET | Freq: Four times a day (QID) | ORAL | Status: DC | PRN

## 2024-09-06 MED ORDER — POLYETHYLENE GLYCOL 3350 17 G PO PACK
17.0000 g | PACK | Freq: Every day | ORAL | Status: DC
  Administered 2024-09-08 – 2024-09-11 (×2): 17 g via ORAL
  Filled 2024-09-06 (×6): qty 1

## 2024-09-06 MED ORDER — ONDANSETRON HCL 4 MG/2ML IJ SOLN: 4.0000 mg | Freq: Four times a day (QID) | INTRAMUSCULAR | Status: DC | PRN

## 2024-09-06 MED ORDER — MORPHINE SULFATE (PF) 2 MG/ML IV SOLN
2.0000 mg | Freq: Once | INTRAVENOUS | Status: AC
  Administered 2024-09-06: 2 mg via INTRAVENOUS
  Filled 2024-09-06: qty 1

## 2024-09-06 MED ORDER — OXYCODONE HCL 5 MG PO TABS
5.0000 mg | ORAL_TABLET | Freq: Four times a day (QID) | ORAL | Status: DC | PRN
  Administered 2024-09-06 – 2024-09-12 (×14): 5 mg via ORAL
  Filled 2024-09-06 (×15): qty 1

## 2024-09-06 MED ORDER — LEVOTHYROXINE SODIUM 50 MCG PO TABS
75.0000 ug | ORAL_TABLET | Freq: Every day | ORAL | Status: DC
  Administered 2024-09-07 – 2024-09-12 (×6): 75 ug via ORAL
  Filled 2024-09-06 (×6): qty 1

## 2024-09-06 MED ORDER — ONDANSETRON HCL 4 MG/2ML IJ SOLN
4.0000 mg | INTRAMUSCULAR | Status: AC
  Administered 2024-09-06: 4 mg via INTRAVENOUS
  Filled 2024-09-06: qty 2

## 2024-09-06 MED ORDER — PREDNISONE 2.5 MG PO TABS
2.5000 mg | ORAL_TABLET | Freq: Every day | ORAL | Status: DC
  Administered 2024-09-07 – 2024-09-12 (×6): 2.5 mg via ORAL
  Filled 2024-09-06 (×6): qty 1

## 2024-09-06 MED ORDER — FOLIC ACID 1 MG PO TABS
3.0000 mg | ORAL_TABLET | Freq: Every day | ORAL | Status: DC
  Administered 2024-09-07 – 2024-09-12 (×6): 3 mg via ORAL
  Filled 2024-09-06 (×6): qty 3

## 2024-09-06 MED ORDER — SENNOSIDES-DOCUSATE SODIUM 8.6-50 MG PO TABS
1.0000 | ORAL_TABLET | Freq: Every evening | ORAL | Status: DC | PRN
  Administered 2024-09-07: 1 via ORAL
  Filled 2024-09-06: qty 1

## 2024-09-06 NOTE — ED Triage Notes (Signed)
 Patient brought in by EMS from home for lower back pain x1 week with pain worsening over last 8 hours to the point she cannot ambulate. Hx back surgery, no recent falls/trauma. Patient ambulates with rollator baseline. Current pain 7/10, 9/10 with movement. AxOx4 in triage, lives with daughter. No home meds taken this morning to reduce pain, no meds given by EMS.

## 2024-09-06 NOTE — ED Notes (Addendum)
 This RN called [facility] to apply TLSO brace on pt. [facility] informed this RN that [facility] has TLSO brace in house and staff is to apply brace. This RN informed Ortho Tech that they are not available in the ED floor, Ortho Tech informed RN that admitting floor should have it.

## 2024-09-06 NOTE — Telephone Encounter (Signed)
 Detailed vm left providing verbal orders to start PT

## 2024-09-06 NOTE — ED Provider Notes (Addendum)
 "  [facility] Provider Note    Event Date/Time   First MD Initiated Contact with Patient 09/06/24 0602     (approximate)   History   Back Pain   HPI Kim Santiago is a 89 y.o. female who presents by EMS for evaluation of severe sharp lower back pain that is primarily on the left side but is radiating throughout her back.  She said that she was just recently hospitalized for 2 days for influenza and since she left the hospital she has had severe back pain but this is much worse.  No difficulty urinating that she is aware of but the pain has been most acute only for the last couple of hours.  Nothing in particular makes it feel better and moving around seems to make it worse.  No history of kidney stones.  No chest pain or shortness of breath.  No burning with urination.     Physical Exam   Triage Vital Signs: ED Triage Vitals  Encounter Vitals Group     BP 09/06/24 0609 (!) 154/73     Girls Systolic BP Percentile --      Girls Diastolic BP Percentile --      Boys Systolic BP Percentile --      Boys Diastolic BP Percentile --      Pulse Rate 09/06/24 0609 72     Resp 09/06/24 0609 18     Temp 09/06/24 0609 99 F (37.2 C)     Temp Source 09/06/24 0609 Oral     SpO2 09/06/24 0609 100 %     Weight 09/06/24 0610 59 kg (130 lb)     Height 09/06/24 0610 1.6 m (5' 3)     Head Circumference --      Peak Flow --      Pain Score 09/06/24 0610 7     Pain Loc --      Pain Education --      Exclude from Growth Chart --     Most recent vital signs: Vitals:   09/06/24 0609  BP: (!) 154/73  Pulse: 72  Resp: 18  Temp: 99 F (37.2 C)  SpO2: 100%    General: Awake, alert, pleasant and conversant but in significant distress and crying out occasionally due to pain CV:  Good peripheral perfusion.  Regular rate and rhythm Resp:  Normal effort. Speaking easily and comfortably, no accessory muscle usage nor intercostal retractions.   Abd:  No distention.  No  tenderness to palpation of the lower abdomen. Other:  No easily reproducible tenderness to palpation of her lower back.  She can identify an area to the left of midline of her lower lumbar spine that is the area where most of the pain is located, but it is not easy to reproduce it with palpation.  There are no palpable deformities and there is no evidence of erythema or vesicular lesions to suggest shingles.  She has no pain with straight leg raise on either side and no pain or tenderness with flexion, extension, and rotation (full range of motion) of both hips   ED Results / Procedures / Treatments   Labs (all labs ordered are listed, but only abnormal results are displayed) Labs Reviewed  CBC WITH DIFFERENTIAL/PLATELET - Abnormal; Notable for the following components:      Result Value   WBC 3.5 (*)    RBC 2.50 (*)    Hemoglobin 8.8 (*)    HCT 25.9 (*)  MCV 103.6 (*)    MCH 35.2 (*)    RDW 17.3 (*)    All other components within normal limits  URINALYSIS, ROUTINE W REFLEX MICROSCOPIC  BASIC METABOLIC PANEL WITH GFR     RADIOLOGY See ED course for details   PROCEDURES:  Critical Care performed: No  Procedures    IMPRESSION / MDM / ASSESSMENT AND PLAN / ED COURSE  I reviewed the triage vital signs and the nursing notes.                              Differential diagnosis includes, but is not limited to, musculoskeletal strain, acute on chronic back pain, slipped disc, cauda equina syndrome  Patient's presentation is most consistent with acute presentation with potential threat to life or bodily function.  Labs/studies ordered: CBC with differential, BMP, urinalysis, CT renal stone study, CT L-spine  Interventions/Medications given:  Medications  morphine (PF) 2 MG/ML injection 2 mg (2 mg Intravenous Given 09/06/24 0629)  ondansetron  (ZOFRAN ) injection 4 mg (4 mg Intravenous Given 09/06/24 9370)    (Note:  hospital course my include additional interventions  and/or labs/studies not listed above.)   Patient is in substantial distress and I have ordered 2 mg of IV morphine as well as Zofran  4 g IV.  She reportedly has allergies to NSAID so I will hold off on Toradol.  No obvious neurological impairment at this time.  Ordering CT scans but patient may need an MRI if her back pain continues unabated.     Clinical Course as of 09/06/24 0717  Wed Sep 06, 2024  0651 Transferring ED care at 11 AM to Dr. Rexford to follow-up and reassess. [CF]  0712 Temp: 99 F (37.2 C) [HD]    Clinical Course User Index [CF] Gordan Huxley, MD [HD] Nicholaus Rolland BRAVO, MD     FINAL CLINICAL IMPRESSION(S) / ED DIAGNOSES   Final diagnoses:  Acute left-sided low back pain without sciatica     Rx / DC Orders   ED Discharge Orders     None        Note:  This document was prepared using Dragon voice recognition software and may include unintentional dictation errors.   Gordan Huxley, MD 09/06/24 9347    Gordan Huxley, MD 09/06/24 [phone]  "

## 2024-09-06 NOTE — ED Provider Notes (Signed)
" °  Physical Exam  BP (!) 154/73   Pulse 72   Temp 99 F (37.2 C) (Oral)   Resp 18   Ht 5' 3 (1.6 m)   Wt 59 kg   SpO2 100%   BMI 23.03 kg/m   Physical Exam  Procedures  Procedures  ED Course / MDM   Clinical Course as of 09/06/24 0821  Wed Sep 06, 2024  0651 Transferring ED care at 11 AM to Dr. Rexford to follow-up and reassess. [CF]  9287 Temp: 99 F (37.2 C) [HD]    Clinical Course User Index [CF] Gordan Huxley, MD [HD] Nicholaus Rolland BRAVO, MD   Medical Decision Making Amount and/or Complexity of Data Reviewed Labs: ordered. Radiology: ordered.  Risk Prescription drug management. Decision regarding hospitalization.   This patient was signed out to me at shift change pending completion of workup.  On CT scan the patient does not have any evidence of nephrolithiasis or obstruction.  There is slight worsening of her L1 compression fracture with previous cement augmentation.  It is also noted that she has some bowing of the superior endplate of L4 with no evidence of fracture.  Urinalysis still pending.  I discussed results of the imaging with the patient who reports that she is still having significant pain.  After discussion of the results plan was made to perform an MRI of the lumbar spine for further evaluation of her symptoms.  Fentanyl  ordered for further pain control.  On MRI there is evidence of an acute L4 compression fracture with 10% disc height loss.  This finding was discussed with neurosurgery who recommended a TLSO brace and pain control.  If we are able to control her pain she may be discharged with instructions to follow-up outpatient.  If we are unable to control her pain she should be admitted and they will round on her inpatient.  I discussed results with the patient and patient's daughter.  Patient reports that she is still having significant pain and is unable to walk secondary to the pain.  She and her daughter states that they do not feel that she  could go home at this time.  I will place an order to consult the hospitalist.  Discussed patient's case with the hospitalist who is in agreement with plan for admission.      Rexford Reche HERO, MD 09/06/24 1535  "

## 2024-09-06 NOTE — H&P (Signed)
 "  History and Physical:    Kim Santiago   FMW:[id number] DOB: 07-15-1935 DOA: 09/06/2024  Referring MD/provider: Reche Leventhal, MD PCP: Kim Verneita CROME, MD   Patient coming from: Home  Chief Complaint: Low back pain  History of Present Illness:   Kim Santiago is a 89 y.o. female with medical history significant for HTN, hypothyroidism, HLD, Sjogren's syndrome, SLE, depression, GERD, recent hospitalization from 08/28/2024 through 08/30/2024 for influenza A pneumonia and possibly bacterial pneumonia.  She had a fall on 08/27/2024 according to her daughter when she slid out of her bed onto the ground.  She developed low back pain as a result of the fall.  However, at that time, she was having significant respiratory symptoms so she did not pay much attention to her pain.  After recent discharge from the hospital, patient began to complain of significant low back pain.  Initially, she was able to ambulate with a walker.  However, pain got worse to the point that she could no longer ambulate.  Pain is sharp and severe and nonradiating.  No known relieving factors and is worse with little movement. She still has a cough from recent lung infection.  She is able to move her lower extremities.  No fever, chills, shortness of breath, chest pain, vomiting, GI or urinary symptoms.   ED Course: Vital signs temperature 99.3 F, respiratory 18, pulse 72, BP 154/73, oxygen  saturation 100% on room air.    MRI lumbar spine IMPRESSION: 1. Acute compression fracture of L4 with approximately 10% height loss and associated edema. No significant retropulsion. 2. Moderate to severe spinal canal stenosis at L4-L5 with impingement of the traversing L5 nerve roots. 3. Additional moderate spinal canal stenosis at L3-4 with impingement of the traversing L4 nerve roots. 4. Chronic compression fracture of L1 status post kyphoplasty.     The patient was given IV fentanyl , IV morphine and IV Zofran  in  the ED.  ROS:   ROS all other systems reviewed were negative  Past Medical History:   Past Medical History:  Diagnosis Date   Arthritis    Breast cancer (HCC) 2017   left mastectomy done 11/2015   Breast cancer in female [facility]) 11/18/2015   Left: 3.9 cm tumor, T2, 1/2 sentinel nodes positive for macro metastatic disease, N1, 3 negative nodes in the axillary tail, ER+,PR+, Her 2 neu, low Mammoprint score   Cancer (HCC)    thyroid  takes levothyroxine    CAP (community acquired pneumonia) 05/05/2021   Chronic kidney disease    UTI   Genetic screening 11/2015   Mammoprint of left breast cancer: Low risk for recurrence.    History of heart attack 06/12/2014   Hypertension    hypothyroidism    secondary to thyroidectomy for thyroid  ca   Hypothyroidism    Lupus    subcutaneous   Menopause 40s   natural, hot flashes and mood lability now gone, off prempro 7 months   Myocardial infarction [facility]) 2013   Osteoporosis    Osteopenia   Rosacea    Sjoegren syndrome    Stress-induced cardiomyopathy September of 2013   EF 35%. Peak troponin was 1.8.   Stroke [facility]) 10/2018    Past Surgical History:   Past Surgical History:  Procedure Laterality Date   BACK SURGERY     BREAST BIOPSY Left 10/30/15   positive, done in Dr. Fredirick office   CARDIAC CATHETERIZATION  05/2012   [facility]. No significant CAD. Ejection fraction of 35% due  to stress-induced cardiomyopathy.   CHOLECYSTECTOMY     COLONOSCOPY     DILATION AND CURETTAGE OF UTERUS     KYPHOSIS SURGERY  Feb 2008   L1, Dr. Kathi   LUMBAR DISC SURGERY     L4-L5   MASTECTOMY Left 11/18/2015   positive   SENTINEL NODE BIOPSY Left 11/18/2015   Procedure: SENTINEL NODE BIOPSY;  Surgeon: Reyes LELON Cota, MD;  Location: [facility] ORS;  Service: General;  Laterality: Left;   SHOULDER ARTHROSCOPY  2004   Left, Dr. Maryl   SIMPLE MASTECTOMY WITH AXILLARY SENTINEL NODE BIOPSY Left 11/18/2015   Procedure: SIMPLE MASTECTOMY;  Surgeon: Reyes LELON Cota, MD;  Location: [facility] ORS;  Service: General;  Laterality: Left;   SPINE SURGERY     L4-5 diskectomy   THYROIDECTOMY     Thyroid  Cancer   TONSILLECTOMY     TUBAL LIGATION      Social History:   Social History   Socioeconomic History   Marital status: Widowed    Spouse name: Not on file   Number of children: 1   Years of education: college   Highest education level: Not on file  Occupational History   Not on file  Tobacco Use   Smoking status: Never   Smokeless tobacco: Never  Vaping Use   Vaping status: Never Used  Substance and Sexual Activity   Alcohol use: No   Drug use: No   Sexual activity: Not Currently  Other Topics Concern   Not on file  Social History Narrative   Has 1 adopted daughter.   Social Drivers of Health   Tobacco Use: Low Risk (09/06/2024)   Patient History    Smoking Tobacco Use: Never    Smokeless Tobacco Use: Never    Passive Exposure: Not on file  Financial Resource Strain: Low Risk (04/18/2024)   Overall Financial Resource Strain (CARDIA)    Difficulty of Paying Living Expenses: Not hard at all  Food Insecurity: No Food Insecurity (09/01/2024)   Epic    Worried About Radiation Protection Practitioner of Food in the Last Year: Never true    Ran Out of Food in the Last Year: Never true  Transportation Needs: No Transportation Needs (09/01/2024)   Epic    Lack of Transportation (Medical): No    Lack of Transportation (Non-Medical): No  Physical Activity: Insufficiently Active (04/18/2024)   Exercise Vital Sign    Days of Exercise per Week: 3 days    Minutes of Exercise per Session: 10 min  Stress: No Stress Concern Present (04/18/2024)   [employer] of Occupational Health - Occupational Stress Questionnaire    Feeling of Stress: Only a little  Social Connections: Moderately Isolated (08/28/2024)   Social Connection and Isolation Panel    Frequency of Communication with Friends and Family: Three times a week    Frequency of Social Gatherings with  Friends and Family: More than three times a week    Attends Religious Services: More than 4 times per year    Active Member of Clubs or Organizations: No    Attends Banker Meetings: Never    Marital Status: Widowed  Intimate Partner Violence: Not At Risk (09/01/2024)   Epic    Fear of Current or Ex-Partner: No    Emotionally Abused: No    Physically Abused: No    Sexually Abused: No  Depression (PHQ2-9): High Risk (09/01/2024)   Depression (PHQ2-9)    PHQ-2 Score: 12  Alcohol Screen: Low Risk (04/18/2024)  Alcohol Screen    Last Alcohol Screening Score (AUDIT): 0  Housing: Unknown (09/01/2024)   Epic    Unable to Pay for Housing in the Last Year: No    Number of Times Moved in the Last Year: Not on file    Homeless in the Last Year: No  Utilities: Not At Risk (09/01/2024)   Epic    Threatened with loss of utilities: No  Health Literacy: Adequate Health Literacy (04/18/2024)   B1300 Health Literacy    Frequency of need for help with medical instructions: Never    Allergies   Naprosyn [naproxen], Amoxicillin , Azathioprine , Codeine, Hydroxychloroquine, Mycophenolate mofetil, Orudis [ketoprofen], Sulfa antibiotics, and Sulfathiazole  Family history:   Family History  Problem Relation Age of Onset   Kidney disease Mother    Heart disease Mother    COPD Father    Cancer Father        esophageal   Kidney disease Sister    Cancer Brother 19       colon cancer (both brothers)   Cancer Brother    Heart attack Brother 49   Heart disease Brother    Breast cancer Neg Hx     Current Medications:   Prior to Admission medications  Medication Sig Start Date End Date Taking? Authorizing Provider  acetaminophen  (TYLENOL ) 500 MG tablet Take 500 mg by mouth every 4 (four) hours as needed.   Yes [provider]  atorvastatin  (LIPITOR) 20 MG tablet TAKE 1 TABLET BY MOUTH EVERY DAY AT 6 PM 04/10/24  Yes Kim Verneita CROME, MD  Calcium  Carbonate-Vitamin D  600-200  MG-UNIT TABS Take 2 tablets by mouth daily.   Yes [provider]  CRANBERRY PO Take 1 capsule by mouth 2 (two) times daily.   Yes [provider]  fluticasone (FLONASE) 50 MCG/ACT nasal spray Place 1 spray into both nostrils daily. Patient taking differently: Place 1 spray into both nostrils daily as needed for allergies. 06/27/23  Yes [provider]  folic acid  (FOLVITE ) 1 MG tablet Take 3 mg by mouth daily. 08/13/21  Yes [provider]  furosemide  (LASIX ) 20 MG tablet TAKE 1 TABLET BY MOUTH EVERY OTHER DAY AS NEEDED 08/14/24  Yes Kim Verneita CROME, MD  levothyroxine  (SYNTHROID ) 75 MCG tablet TAKE 1 TABLET BY MOUTH ( TOTAL) DAILY 01/10/24  Yes Kim Verneita CROME, MD  methotrexate  (RHEUMATREX) 2.5 MG tablet Take 2.5 mg by mouth once a week. Caution:Chemotherapy. Protect from light.   Yes [provider]  metoprolol  succinate (TOPROL -XL) 25 MG 24 hr tablet TAKE 1 TABLET BY MOUTH EVERY DAY 04/10/24  Yes Tullo, Teresa L, MD  omeprazole  (PRILOSEC) 20 MG capsule TAKE 1 CAPSULE (20 MG TOTAL) BY MOUTH 2 (TWO) TIMES DAILY BEFORE A MEAL. Patient taking differently: Take 20 mg by mouth 2 (two) times daily as needed. 04/10/24  Yes Kim Verneita CROME, MD  ondansetron  (ZOFRAN ) 4 MG tablet Take 1 tablet (4 mg total) by mouth every 6 (six) hours. Patient taking differently: Take 4 mg by mouth every 6 (six) hours as needed. 09/10/23  Yes Tullo, Teresa L, MD  polyethylene glycol (MIRALAX  / GLYCOLAX ) packet Take 17 g by mouth daily.   Yes [provider]  predniSONE  (DELTASONE ) 2.5 MG tablet Take 2.5 mg by mouth daily. 08/14/24  Yes [provider]  Probiotic Product (PROBIOTIC PO) Take 1 tablet by mouth daily.   Yes [provider]  sertraline  (ZOLOFT ) 25 MG tablet TAKE 1 TABLET (25 MG TOTAL) BY  MOUTH DAILY. 03/30/24  Yes Kim Verneita CROME, MD  tiZANidine  (ZANAFLEX ) 2 MG tablet Take 1 tablet (2 mg total) by mouth every 8 (eight) hours as needed for muscle  spasms. 06/06/24  Yes Kim Verneita CROME, MD  triamcinolone  cream (KENALOG ) 0.1 % APPLY TO AFFECTED AREA TWICE A DAY 03/21/21  Yes [provider]  SODIUM FLUORIDE 5000 PPM 1.1 % PSTE See admin instructions. 06/09/23   [provider]    Physical Exam:   Vitals:   09/06/24 0609 09/06/24 0610 09/06/24 1321  BP: (!) 154/73  (!) 140/86  Pulse: 72  86  Resp: 18  20  Temp: 99 F (37.2 C)  98.6 F (37 C)  TempSrc: Oral  Oral  SpO2: 100%  96%  Weight:  59 kg   Height:  5' 3 (1.6 m)      Physical Exam: Blood pressure (!) 140/86, pulse 86, temperature 98.6 F (37 C), temperature source Oral, resp. rate 20, height 5' 3 (1.6 m), weight 59 kg, SpO2 96%. Gen: No acute distress. Head: Normocephalic, atraumatic. Eyes: Pupils equal, round and reactive to light. Extraocular movements intact.  Sclerae nonicteric.  Mouth: Moist mucous membranes Neck: Supple, no thyromegaly, no lymphadenopathy, no jugular venous distention. Chest: Lungs are clear to auscultation with good air movement. No rales, rhonchi or wheezes.  CV: Heart sounds are regular with an S1, S2. No murmurs, rubs or gallops.  Abdomen: Soft, nontender, nondistended with normal active bowel sounds. No palpable masses. Extremities: Extremities are without clubbing, or cyanosis. No edema. Pedal pulses 2+.  Skin: Warm and dry.  Multiple bruises/purpura on chest and extremities Neuro: Alert and oriented times 3; grossly nonfocal.  Msk: Lumbar spinal tenderness Psych: Insight is good and judgment is appropriate. Mood and affect normal.   Data Review:    Labs: Basic Metabolic Panel: Recent Labs  Lab 09/06/24 0626  NA 134*  K 4.2  CL 99  CO2 27  GLUCOSE 93  BUN 11  CREATININE 0.71  CALCIUM  9.0   Liver Function Tests: No results for input(s): AST, ALT, ALKPHOS, BILITOT, PROT, ALBUMIN in the last 168 hours. No results for input(s): LIPASE, AMYLASE in the last 168 hours. No results for input(s):  AMMONIA in the last 168 hours. CBC: Recent Labs  Lab 09/06/24 0626  WBC 3.5*  NEUTROABS 1.5*  HGB 8.8*  HCT 25.9*  MCV 103.6*  PLT 195   Cardiac Enzymes: No results for input(s): CKTOTAL, CKMB, CKMBINDEX, TROPONINI in the last 168 hours.  BNP (last 3 results) No results for input(s): PROBNP in the last 8760 hours. CBG: No results for input(s): GLUCAP in the last 168 hours.  Urinalysis    Component Value Date/Time   COLORURINE YELLOW (A) 09/06/2024 0621   APPEARANCEUR HAZY (A) 09/06/2024 0621   APPEARANCEUR Slightly cloudy 05/11/2024 1108   LABSPEC 1.012 09/06/2024 0621   PHURINE 7.0 09/06/2024 0621   GLUCOSEU NEGATIVE 09/06/2024 0621   GLUCOSEU NEGATIVE 05/11/2017 1143   HGBUR NEGATIVE 09/06/2024 0621   BILIRUBINUR NEGATIVE 09/06/2024 0621   BILIRUBINUR Negative 05/11/2024 1108   KETONESUR NEGATIVE 09/06/2024 0621   PROTEINUR NEGATIVE 09/06/2024 0621   UROBILINOGEN 0.2 07/04/2018 1134   UROBILINOGEN 0.2 05/11/2017 1143   NITRITE NEGATIVE 09/06/2024 0621   LEUKOCYTESUR SMALL (A) 09/06/2024 0621      Radiographic Studies: MR LUMBAR SPINE WO CONTRAST Result Date: 09/06/2024 EXAM: MRI LUMBAR SPINE 09/06/2024 12:26:44 PM TECHNIQUE: Multiplanar multisequence MRI of the lumbar spine was performed without the administration of  intravenous contrast. COMPARISON: Same day CT of the lumbar spine. CLINICAL HISTORY: Worsening lumbar pain. FINDINGS: BONES AND ALIGNMENT: Normal alignment. Chronic compression deformity of L1 with vertebral augmentation cement noted. Up to 40% height loss of L1 centrally. No significant retropulsion. Irregularity of the L4 superior endplate with associated edema suggestive of acute compression fracture. Approximately 10% height loss of L4. No significant retropulsion. Schmorl nodes at multiple levels. Vertebral body heights otherwise maintained. Heterogeneous bone marrow signal intensity. SPINAL CORD: The conus medullaris extends to the  L1-L2 level. SOFT TISSUES: No paraspinal mass. Mild atrophy of the paraspinal musculature. T12-L1: Small disc bulge. Mild to moderate facet arthrosis. Mild lateral recess narrowing. Mild spinal canal stenosis. No significant foraminal stenosis. L1-L2: Small disc bulge. Mild facet arthrosis. Thickening of the ligamentum flavum. Mild lateral recess narrowing. No significant spinal canal or foraminal stenosis. L2-L3: Diffuse disc bulge. Mild to moderate facet arthrosis. Thickening of the ligamentum flavum. Mild lateral recess narrowing. No significant spinal canal or foraminal stenosis. L3-L4: Diffuse disc bulge. Moderate facet arthrosis. Thickening of the ligamentum flavum. Lateral recess narrowing. Moderate spinal canal stenosis. Potential impingement of the traversing L4 nerve roots. No significant foraminal stenosis. L4-L5: Mild disc height loss. Diffuse disc bulge. Moderate to severe facet arthrosis. Thickening of the ligamentum flavum. Trace facet effusion on the right. Moderate to severe spinal canal stenosis with impingement of the traversing L5 nerve roots. No significant foraminal stenosis. L5-S1: Disc desiccation. Mild disc height loss posteriorly. Small disc bulge and central disc protrusion. Right subarticular annular fissure. Moderate facet arthrosis. Thickening of the ligamentum flavum. Mild lateral recess narrowing without significant spinal canal stenosis. No significant foraminal stenosis. IMPRESSION: 1. Acute compression fracture of L4 with approximately 10% height loss and associated edema. No significant retropulsion. 2. Moderate to severe spinal canal stenosis at L4-L5 with impingement of the traversing L5 nerve roots. 3. Additional moderate spinal canal stenosis at L3-4 with impingement of the traversing L4 nerve roots. 4. Chronic compression fracture of L1 status post kyphoplasty. Electronically signed by: Donnice Mania MD 09/06/2024 01:09 PM EST RP Workstation: [id number]   CT L-SPINE NO  CHARGE Result Date: 09/06/2024 EXAM: CT OF THE LUMBAR SPINE WITHOUT CONTRAST 09/06/2024 07:24:11 AM TECHNIQUE: CT of the lumbar spine was performed without the administration of intravenous contrast. Multiplanar reformatted images are provided for review. Automated exposure control, iterative reconstruction, and/or weight based adjustment of the mA/kV was utilized to reduce the radiation dose to as low as reasonably achievable. COMPARISON: MRI of the lumbar spine dated 10/15/2006. CLINICAL HISTORY: FINDINGS: BONES AND ALIGNMENT: Vertebral augmentation at L1. There is a mild-to-moderate compression deformity at L1, mildly progressed in the interim. Mild dextroscoliosis. Mild downward bowing of the superior endplate of L4. No acute fracture or suspicious bone lesion. DEGENERATIVE CHANGES: Moderate diffuse chronic degenerative disc disease present at L2-3, L3-4 and L4-5. At L3-4 and L4-5, there is also moderate-to-severe central spinal canal stenosis and bilateral lateral recess stenosis, with likely impingement of the transiting nerves in the lateral recesses bilaterally. SOFT TISSUES: Moderate calcific atheromatous disease within the abdominal aorta. IMPRESSION: 1. Mild-to-moderate compression deformity at L1, mildly progressed in the interim, status post vertebral augmentation. 2. Mild dextroscoliosis and mild downward bowing of the superior endplate of L4. 3. Moderate-to-severe central spinal canal stenosis and bilateral lateral recess stenosis at L3-4 and L4-5, with likely impingement of the transiting nerves in the lateral recesses bilaterally. 4. Moderate diffuse chronic degenerative disc disease at L2-3, L3-4, and L4-5. Electronically signed by: Evalene Coho MD  09/06/2024 08:07 AM EST RP Workstation: [id number]   CT Renal Stone Study Result Date: 09/06/2024 EXAM: CT UROGRAM 09/06/2024 07:24:11 AM TECHNIQUE: CT of the abdomen and pelvis was performed without the administration of intravenous contrast  as per CT urogram protocol. Multiplanar reformatted images as well as MIP urogram images are provided for review. Automated exposure control, iterative reconstruction, and/or weight based adjustment of the mA/kV was utilized to reduce the radiation dose to as low as reasonably achievable. COMPARISON: 02/28/2018 CLINICAL HISTORY: Abdominal/flank pain, stone suspected; severe left sided lower back pain. FINDINGS: LOWER CHEST: Bibasilar peripheral predominant interstitial reticulation noted, likely reflecting sequelae of chronic inflammation. LIVER: The liver is unremarkable. GALLBLADDER AND BILE DUCTS: Status post cholecystectomy. No biliary ductal dilatation. SPLEEN: No acute abnormality. PANCREAS: No acute abnormality. ADRENAL GLANDS: No acute abnormality. KIDNEYS, URETERS AND BLADDER: No stones in the kidneys or ureters. No hydronephrosis. No perinephric or periureteral stranding. Urinary bladder is unremarkable. GI AND BOWEL: Small hiatal hernia. No small bowel dilatation. Liquid stool with fluid-air levels identified within the colon, which may reflect an underlying diarrheal illness. No signs of bowel wall thickening, inflammation, or distention noted. The appendix is not completely visualized, identified separate from the right lower quadrant bowel loops. No secondary signs of acute appendicitis. PERITONEUM AND RETROPERITONEUM: No ascites. No free air. VASCULATURE: Aorta is normal in caliber. Aortic atherosclerotic calcification. LYMPH NODES: No lymphadenopathy. REPRODUCTIVE ORGANS: Uterus appears normal. No adnexal mass identified. BONES AND SOFT TISSUES: The bones are diffusely osteopenic. Unchanged cement augmentation of the L1 vertebral compression deformity. New mild superior endplate compression deformity involving the L4 vertebra. This is age indeterminate but is new from 02/28/2018. No focal soft tissue abnormality. IMPRESSION: 1. No nephrolithiasis, obstructive uropathy, or mass. 2. New mild superior  endplate compression deformity involving the L4 vertebra, age indeterminate. 3. Liquid stool with fluid-air levels identified within the colon, possibly reflecting an underlying diarrheal illness, without bowel wall thickening, inflammation, or distention. Electronically signed by: Waddell Calk MD 09/06/2024 08:02 AM EST RP Workstation: [id number]    EKG: No EKG done   Assessment/Plan:   Principal Problem:   Closed L4 vertebral fracture (HCC)    Body mass index is 23.03 kg/m.    Acute closed L4 vertebral fracture, most severe acute on chronic low back pain, s/p fall at home: Admit to MedSurg.  Analgesics as needed for pain.  Use IV morphine and oxycodone  as needed for pain.  Case discussed with Dr. Claudene, neurosurgeon.  He recommended TLSO brace for comfort. PT and OT evaluation.   Cough from recent influenza A pneumonia: Positive influenza A test on 08/28/2024   Drug-induced SLE: Continue low-dose chronic prednisone .   Chronic diastolic CHF: Compensated   Comorbidities include chronic anemia, hypertension, depression, chronic moderate to severe lumbar spinal stenosis, chronic compression fracture of L1 s/p kyphoplasty   Case discussed with Dr. Rexford, ED physician.  Other information:   DVT prophylaxis: Lovenox   Code Status: DNR.  This was confirmed by patient and daughter at the bedside Family Communication: Plan discussed with Kim Santiago, daughter, at the bedside Disposition Plan: Plan to discharge home versus SNF Consults called: Neurosurgeon Admission status: Observation      Abegail Kloeppel [facility] Pager: Please check [email or web address]   How to contact the [facility] Attending or Consulting provider 7A - 7P or covering provider during after hours 7P -7A, for this patient?   Check the care team in [facility] and look for a) attending/consulting [facility] provider listed and b) the [facility] team  listed Log into [email or web address] and use [place]'s universal password to  access. If you do not have the password, please contact the hospital operator. Locate the [facility] provider you are looking for under [facility] and page to a number that you can be directly reached. If you still have difficulty reaching the provider, please page the [facility] (Director on Call) for the Hospitalists listed on amion for assistance.  09/06/2024, 4:11 PM  "

## 2024-09-06 NOTE — Progress Notes (Signed)
 Neurosurgery imaging review  Reviewed MRI, no evidence of compression of neural structures.  Spine fracture does not appear unstable.  Can utilize a TLSO brace for comfort.  We can follow-up with her in clinic with upright spine x-rays.  If she gets admitted for pain control will be happy to formally round on her if needed.

## 2024-09-07 DIAGNOSIS — S32049A Unspecified fracture of fourth lumbar vertebra, initial encounter for closed fracture: Secondary | ICD-10-CM | POA: Diagnosis not present

## 2024-09-07 DIAGNOSIS — S32040A Wedge compression fracture of fourth lumbar vertebra, initial encounter for closed fracture: Secondary | ICD-10-CM | POA: Diagnosis not present

## 2024-09-07 LAB — URINE CULTURE

## 2024-09-07 NOTE — Discharge Instructions (Signed)

## 2024-09-07 NOTE — Consult Note (Addendum)
 Consulting Department:  Inpatient Medicine  Primary Physician:  Marylynn Verneita CROME, MD  Chief Complaint:  Compression Fracture  History of Present Illness: 09/07/2024 Kim Santiago is a 89 y.o. female who presents with the chief complaint of compression fractures.  She has had multiple compression fractures before and been treated with TLSO brace.  She generally does not like to wear the brace however will consider if her pain is severe.  Has had a previous kyphoplasty.  Not currently interested in kyphoplasty at this time.  No new neurologic deficits.  The symptoms are causing a significant impact on the patient's life.   Review of Systems:  A 10 point review of systems is negative, except for the pertinent positives and negatives detailed in the HPI.  Past Medical History: Past Medical History:  Diagnosis Date   Arthritis    Breast cancer (HCC) 2017   left mastectomy done 11/2015   Breast cancer in female [facility]) 11/18/2015   Left: 3.9 cm tumor, T2, 1/2 sentinel nodes positive for macro metastatic disease, N1, 3 negative nodes in the axillary tail, ER+,PR+, Her 2 neu, low Mammoprint score   Cancer (HCC)    thyroid  takes levothyroxine    CAP (community acquired pneumonia) 05/05/2021   Chronic kidney disease    UTI   Genetic screening 11/2015   Mammoprint of left breast cancer: Low risk for recurrence.    History of heart attack 06/12/2014   Hypertension    hypothyroidism    secondary to thyroidectomy for thyroid  ca   Hypothyroidism    Lupus    subcutaneous   Menopause 40s   natural, hot flashes and mood lability now gone, off prempro 7 months   Myocardial infarction [facility]) 2013   Osteoporosis    Osteopenia   Rosacea    Sjoegren syndrome    Stress-induced cardiomyopathy September of 2013   EF 35%. Peak troponin was 1.8.   Stroke [facility]) 10/2018    Past Surgical History: Past Surgical History:  Procedure Laterality Date   BACK SURGERY     BREAST BIOPSY Left 10/30/15    positive, done in Dr. Fredirick office   CARDIAC CATHETERIZATION  05/2012   [facility]. No significant CAD. Ejection fraction of 35% due to stress-induced cardiomyopathy.   CHOLECYSTECTOMY     COLONOSCOPY     DILATION AND CURETTAGE OF UTERUS     KYPHOSIS SURGERY  Feb 2008   L1, Dr. Kathi   LUMBAR DISC SURGERY     L4-L5   MASTECTOMY Left 11/18/2015   positive   SENTINEL NODE BIOPSY Left 11/18/2015   Procedure: SENTINEL NODE BIOPSY;  Surgeon: Reyes LELON Cota, MD;  Location: [facility] ORS;  Service: General;  Laterality: Left;   SHOULDER ARTHROSCOPY  2004   Left, Dr. Maryl   SIMPLE MASTECTOMY WITH AXILLARY SENTINEL NODE BIOPSY Left 11/18/2015   Procedure: SIMPLE MASTECTOMY;  Surgeon: Reyes LELON Cota, MD;  Location: [facility] ORS;  Service: General;  Laterality: Left;   SPINE SURGERY     L4-5 diskectomy   THYROIDECTOMY     Thyroid  Cancer   TONSILLECTOMY     TUBAL LIGATION      Allergies: Allergies as of 09/06/2024 - Review Complete 09/06/2024  Allergen Reaction Noted   Naprosyn [naproxen] Swelling 05/25/2011   Amoxicillin  Rash 11/06/2011   Azathioprine  Nausea And Vomiting 03/09/2018   Codeine Nausea And Vomiting 05/25/2011   Hydroxychloroquine Hives and Nausea And Vomiting 08/12/2016   Mycophenolate mofetil Nausea Only 05/27/2018   Orudis [ketoprofen] Hives 05/25/2011  Sulfa antibiotics Rash 05/25/2011   Sulfathiazole Rash 05/25/2011    Medications: Current Medications[1]   Social History: Social History[2]  Family Medical History: Family History  Problem Relation Age of Onset   Kidney disease Mother    Heart disease Mother    COPD Father    Cancer Father        esophageal   Kidney disease Sister    Cancer Brother 59       colon cancer (both brothers)   Cancer Brother    Heart attack Brother 88   Heart disease Brother    Breast cancer Neg Hx     Physical Examination: Vitals:   09/07/24 0430 09/07/24 0750  BP: (!) 109/47 (!) 130/56  Pulse: 97 92  Resp: 17 16   Temp: 99.1 F (37.3 C) 97.8 F (36.6 C)  SpO2: 94% 96%     General: Patient is well developed, well nourished, calm, collected, and in no apparent distress.  NEUROLOGICAL:  General: In no acute distress.   Awake, alert, oriented to person. Pupils equal round and reactive to light.  Facial tone is symmetric.  Tongue protrusion is midline.  There is no pronator drift.   Strength: At least antigravity in the bilateral lower extremities with no areas of major deficit.   Bilateral upper and lower extremity sensation is intact to light touch.  Imaging: MR LUMBAR SPINE WO CONTRAST Result Date: 09/06/2024 EXAM: MRI LUMBAR SPINE 09/06/2024 12:26:44 PM TECHNIQUE: Multiplanar multisequence MRI of the lumbar spine was performed without the administration of intravenous contrast. COMPARISON: Same day CT of the lumbar spine. CLINICAL HISTORY: Worsening lumbar pain. FINDINGS: BONES AND ALIGNMENT: Normal alignment. Chronic compression deformity of L1 with vertebral augmentation cement noted. Up to 40% height loss of L1 centrally. No significant retropulsion. Irregularity of the L4 superior endplate with associated edema suggestive of acute compression fracture. Approximately 10% height loss of L4. No significant retropulsion. Schmorl nodes at multiple levels. Vertebral body heights otherwise maintained. Heterogeneous bone marrow signal intensity. SPINAL CORD: The conus medullaris extends to the L1-L2 level. SOFT TISSUES: No paraspinal mass. Mild atrophy of the paraspinal musculature. T12-L1: Small disc bulge. Mild to moderate facet arthrosis. Mild lateral recess narrowing. Mild spinal canal stenosis. No significant foraminal stenosis. L1-L2: Small disc bulge. Mild facet arthrosis. Thickening of the ligamentum flavum. Mild lateral recess narrowing. No significant spinal canal or foraminal stenosis. L2-L3: Diffuse disc bulge. Mild to moderate facet arthrosis. Thickening of the ligamentum flavum. Mild lateral  recess narrowing. No significant spinal canal or foraminal stenosis. L3-L4: Diffuse disc bulge. Moderate facet arthrosis. Thickening of the ligamentum flavum. Lateral recess narrowing. Moderate spinal canal stenosis. Potential impingement of the traversing L4 nerve roots. No significant foraminal stenosis. L4-L5: Mild disc height loss. Diffuse disc bulge. Moderate to severe facet arthrosis. Thickening of the ligamentum flavum. Trace facet effusion on the right. Moderate to severe spinal canal stenosis with impingement of the traversing L5 nerve roots. No significant foraminal stenosis. L5-S1: Disc desiccation. Mild disc height loss posteriorly. Small disc bulge and central disc protrusion. Right subarticular annular fissure. Moderate facet arthrosis. Thickening of the ligamentum flavum. Mild lateral recess narrowing without significant spinal canal stenosis. No significant foraminal stenosis. IMPRESSION: 1. Acute compression fracture of L4 with approximately 10% height loss and associated edema. No significant retropulsion. 2. Moderate to severe spinal canal stenosis at L4-L5 with impingement of the traversing L5 nerve roots. 3. Additional moderate spinal canal stenosis at L3-4 with impingement of the traversing L4 nerve roots.  4. Chronic compression fracture of L1 status post kyphoplasty. Electronically signed by: Donnice Mania MD 09/06/2024 01:09 PM EST RP Workstation: [id number]   CT L-SPINE NO CHARGE Result Date: 09/06/2024 EXAM: CT OF THE LUMBAR SPINE WITHOUT CONTRAST 09/06/2024 07:24:11 AM TECHNIQUE: CT of the lumbar spine was performed without the administration of intravenous contrast. Multiplanar reformatted images are provided for review. Automated exposure control, iterative reconstruction, and/or weight based adjustment of the mA/kV was utilized to reduce the radiation dose to as low as reasonably achievable. COMPARISON: MRI of the lumbar spine dated 10/15/2006. CLINICAL HISTORY: FINDINGS: BONES AND  ALIGNMENT: Vertebral augmentation at L1. There is a mild-to-moderate compression deformity at L1, mildly progressed in the interim. Mild dextroscoliosis. Mild downward bowing of the superior endplate of L4. No acute fracture or suspicious bone lesion. DEGENERATIVE CHANGES: Moderate diffuse chronic degenerative disc disease present at L2-3, L3-4 and L4-5. At L3-4 and L4-5, there is also moderate-to-severe central spinal canal stenosis and bilateral lateral recess stenosis, with likely impingement of the transiting nerves in the lateral recesses bilaterally. SOFT TISSUES: Moderate calcific atheromatous disease within the abdominal aorta. IMPRESSION: 1. Mild-to-moderate compression deformity at L1, mildly progressed in the interim, status post vertebral augmentation. 2. Mild dextroscoliosis and mild downward bowing of the superior endplate of L4. 3. Moderate-to-severe central spinal canal stenosis and bilateral lateral recess stenosis at L3-4 and L4-5, with likely impingement of the transiting nerves in the lateral recesses bilaterally. 4. Moderate diffuse chronic degenerative disc disease at L2-3, L3-4, and L4-5. Electronically signed by: Evalene Coho MD 09/06/2024 08:07 AM EST RP Workstation: [id number]   CT Renal Stone Study Result Date: 09/06/2024 EXAM: CT UROGRAM 09/06/2024 07:24:11 AM TECHNIQUE: CT of the abdomen and pelvis was performed without the administration of intravenous contrast as per CT urogram protocol. Multiplanar reformatted images as well as MIP urogram images are provided for review. Automated exposure control, iterative reconstruction, and/or weight based adjustment of the mA/kV was utilized to reduce the radiation dose to as low as reasonably achievable. COMPARISON: 02/28/2018 CLINICAL HISTORY: Abdominal/flank pain, stone suspected; severe left sided lower back pain. FINDINGS: LOWER CHEST: Bibasilar peripheral predominant interstitial reticulation noted, likely reflecting sequelae of  chronic inflammation. LIVER: The liver is unremarkable. GALLBLADDER AND BILE DUCTS: Status post cholecystectomy. No biliary ductal dilatation. SPLEEN: No acute abnormality. PANCREAS: No acute abnormality. ADRENAL GLANDS: No acute abnormality. KIDNEYS, URETERS AND BLADDER: No stones in the kidneys or ureters. No hydronephrosis. No perinephric or periureteral stranding. Urinary bladder is unremarkable. GI AND BOWEL: Small hiatal hernia. No small bowel dilatation. Liquid stool with fluid-air levels identified within the colon, which may reflect an underlying diarrheal illness. No signs of bowel wall thickening, inflammation, or distention noted. The appendix is not completely visualized, identified separate from the right lower quadrant bowel loops. No secondary signs of acute appendicitis. PERITONEUM AND RETROPERITONEUM: No ascites. No free air. VASCULATURE: Aorta is normal in caliber. Aortic atherosclerotic calcification. LYMPH NODES: No lymphadenopathy. REPRODUCTIVE ORGANS: Uterus appears normal. No adnexal mass identified. BONES AND SOFT TISSUES: The bones are diffusely osteopenic. Unchanged cement augmentation of the L1 vertebral compression deformity. New mild superior endplate compression deformity involving the L4 vertebra. This is age indeterminate but is new from 02/28/2018. No focal soft tissue abnormality. IMPRESSION: 1. No nephrolithiasis, obstructive uropathy, or mass. 2. New mild superior endplate compression deformity involving the L4 vertebra, age indeterminate. 3. Liquid stool with fluid-air levels identified within the colon, possibly reflecting an underlying diarrheal illness, without bowel wall thickening, inflammation, or distention. Electronically  signed by: Waddell Calk MD 09/06/2024 08:02 AM EST RP Workstation: [id number]     I have personally reviewed the images and agree with the above interpretation.  Labs:    Latest Ref Rng & Units 09/06/2024    6:26 AM 08/29/2024    5:58 AM  08/28/2024    8:43 AM  CBC  WBC 4.0 - 10.5 K/uL 3.5  5.2  4.9   Hemoglobin 12.0 - 15.0 g/dL 8.8  7.2  8.1   Hematocrit 36.0 - 46.0 % 25.9  21.0  23.4   Platelets 150 - 400 K/uL 195  82  99       Latest Ref Rng & Units 09/06/2024    6:26 AM 08/29/2024    5:58 AM 08/28/2024    8:43 AM  BMP  Glucose 70 - 99 mg/dL 93  91  879   BUN 8 - 23 mg/dL 11  15  17    Creatinine 0.44 - 1.00 mg/dL 9.28  9.33  9.34   Sodium 135 - 145 mmol/L 134  133  132   Potassium 3.5 - 5.1 mmol/L 4.2  4.1  4.1   Chloride 98 - 111 mmol/L 99  102  99   CO2 22 - 32 mmol/L 27  27  25    Calcium  8.9 - 10.3 mg/dL 9.0  7.5  7.7         Assessment and Plan: Ms. List is a pleasant 89 y.o. female with history of compression fractures and osteoporosis status post kyphoplasty.  Has new osteoporotic Lumbar fracture has midline back pain associated.  No evidence of neural compression.  At this time can be managed conservatively with bracing.  Patient not interested in going forward with any procedure.  No new neurologic deficits.  She can follow-up in clinic in the right x-rays.  Can use a brace for comfort.  Penne MICAEL Sharps, MD/MSCR Dept. of Neurosurgery       [1]  Current Facility-Administered Medications:    acetaminophen  (TYLENOL ) tablet 650 mg, 650 mg, Oral, Q6H PRN **OR** acetaminophen  (TYLENOL ) suppository 650 mg, 650 mg, Rectal, Q6H PRN, Jens Durand, MD   atorvastatin  (LIPITOR) tablet 20 mg, 20 mg, Oral, QHS, Ayiku, Bernard, MD, 20 mg at 09/06/24 2132   enoxaparin  (LOVENOX ) injection 40 mg, 40 mg, Subcutaneous, Q24H, Ayiku, Bernard, MD   folic acid  (FOLVITE ) tablet 3 mg, 3 mg, Oral, Daily, Jens Durand, MD   guaiFENesin -dextromethorphan  (ROBITUSSIN DM) 100-10 MG/5ML syrup 5 mL, 5 mL, Oral, Q4H PRN, Ayiku, Bernard, MD, 5 mL at 09/06/24 1843   levothyroxine  (SYNTHROID ) tablet 75 mcg, 75 mcg, Oral, Q0600, Ayiku, Bernard, MD, 75 mcg at 09/07/24 9375   metoprolol  succinate (TOPROL -XL) 24 hr tablet 25 mg, 25  mg, Oral, QHS, Ayiku, Bernard, MD, 25 mg at 09/06/24 2132   morphine  (PF) 2 MG/ML injection 2 mg, 2 mg, Intravenous, Q4H PRN, Ayiku, Bernard, MD   ondansetron  (ZOFRAN ) tablet 4 mg, 4 mg, Oral, Q6H PRN **OR** ondansetron  (ZOFRAN ) injection 4 mg, 4 mg, Intravenous, Q6H PRN, Jens Durand, MD   oxyCODONE  (Oxy IR/ROXICODONE ) immediate release tablet 5 mg, 5 mg, Oral, Q6H PRN, Ayiku, Bernard, MD, 5 mg at 09/07/24 0435   oxyCODONE -acetaminophen  (PERCOCET/ROXICET) 5-325 MG per tablet 1 tablet, 1 tablet, Oral, Once, Mulvihill, Caitlin M, MD   polyethylene glycol (MIRALAX  / GLYCOLAX ) packet 17 g, 17 g, Oral, Daily, Ayiku, Bernard, MD   predniSONE  (DELTASONE ) tablet 2.5 mg, 2.5 mg, Oral, Daily, Jens Durand, MD   senna-docusate (Senokot-S) tablet 1  tablet, 1 tablet, Oral, QHS PRN, Ayiku, Bernard, MD   sertraline  (ZOLOFT ) tablet 25 mg, 25 mg, Oral, QHS, Ayiku, Bernard, MD, 25 mg at 09/06/24 2132 [2]  Social History Tobacco Use   Smoking status: Never   Smokeless tobacco: Never  Vaping Use   Vaping status: Never Used  Substance Use Topics   Alcohol use: No   Drug use: No

## 2024-09-07 NOTE — TOC CM/SW Note (Signed)
 Transition of Care [facility]) - Inpatient Brief Assessment   Patient Details  Name: Kim Santiago MRN: [id number] Date of Birth: [birth year]  Transition of Care [facility]) CM/SW Contact:    Daved JONETTA Hamilton, RN Phone Number: 09/07/2024, 12:03 PM   Clinical Narrative:   Transition of Care (TOC) Screening Note   Patient Details  Name: Kim Santiago Date of Birth: [birth year]   Transition of Care [facility]) CM/SW Contact:    Daved JONETTA Hamilton, RN Phone Number: 09/07/2024, 12:03 PM   Patient is current with [facility]. Shelia with [facility] has been notified of patient's hospital encounter.   Transition of Care Department [facility]) has reviewed patient and no TOC needs have been identified at this time. If new patient transition needs arise, please place a TOC consult.    Transition of Care Asessment: Insurance and Status: Insurance coverage has been reviewed Patient has primary care physician: Yes   Prior level of function:: Lives with daughter Prior/Current Home Services: Current home services (Patient is current with [facility]) Social Drivers of Health Review: SDOH reviewed interventions complete Readmission risk has been reviewed: No (patient is in observation status, no score generated) Transition of care needs: no transition of care needs at this time

## 2024-09-07 NOTE — Care Management Obs Status (Signed)
 MEDICARE OBSERVATION STATUS NOTIFICATION   Patient Details  Name: Kim Santiago MRN: [id number] Date of Birth: 19-Apr-1935   Medicare Observation Status Notification Given:  Yes    Goebel Hellums W, CMA 09/07/2024, 4:58 PM

## 2024-09-07 NOTE — Progress Notes (Addendum)
 "    Progress Note    Kim Santiago  FMW:[id number] DOB: 10-07-1934  DOA: 09/06/2024 PCP: Kim Verneita CROME, MD      Brief Narrative:    Medical records reviewed and are as summarized below:  Kim Santiago is a 89 y.o. female with medical history significant for HTN, hypothyroidism, HLD, Sjogren's syndrome, SLE, depression, GERD, recent hospitalization from 08/28/2024 through 08/30/2024 for influenza A pneumonia and possibly bacterial pneumonia.  She had a fall on 08/27/2024 according to her daughter when she slid out of her bed onto the ground.  She developed low back pain as a result of the fall.  However, at that time, she was having significant respiratory symptoms so she did not pay much attention to her pain.  After recent discharge from the hospital, patient began to complain of significant low back pain.  Initially, she was able to ambulate with a walker.  However, pain got worse to the point that she could no longer ambulate.  Pain is sharp and severe and nonradiating.  No known relieving factors and is worse with little movement. She still has a cough from recent lung infection.  She is able to move her lower extremities.  No fever, chills, shortness of breath, chest pain, vomiting, GI or urinary symptoms.     ED Course: Vital signs temperature 99.3 F, respiratory 18, pulse 72, BP 154/73, oxygen  saturation 100% on room air.     MRI lumbar spine IMPRESSION: 1. Acute compression fracture of L4 with approximately 10% height loss and associated edema. No significant retropulsion. 2. Moderate to severe spinal canal stenosis at L4-L5 with impingement of the traversing L5 nerve roots. 3. Additional moderate spinal canal stenosis at L3-4 with impingement of the traversing L4 nerve roots. 4. Chronic compression fracture of L1 status post kyphoplasty.       The patient was given IV fentanyl , IV morphine and IV Zofran  in the ED.       Assessment/Plan:   Principal  Problem:   Closed L4 vertebral fracture (HCC)    Body mass index is 23.03 kg/m.   Acute closed L4 vertebral fracture, most severe acute on chronic low back pain, s/p fall at home: Continue IV morphine and oxycodone  as needed for pain.  Continue TLSO brace for comfort.  PT and OT evaluation.     Cough from recent influenza A pneumonia: Robitussin as needed for cough. Positive influenza A test on 08/28/2024     Drug-induced SLE: Continue low-dose chronic prednisone .     Chronic diastolic CHF: Compensated   Mild hyponatremia: Asymptomatic.   Pyuria: No urinary symptoms, fever or leukocytosis.   Thrombocytopenia: Improved     Comorbidities include chronic anemia, hypertension, depression, chronic moderate to severe lumbar spinal stenosis, chronic compression fracture of L1 s/p kyphoplasty   Plan discussed with Bari, daughter, at the bedside.   Diet Order             Diet Heart Fluid consistency: Thin  Diet effective now                                  Consultants: Neurosurgeon  Procedures: None    Medications:    atorvastatin   20 mg Oral QHS   enoxaparin  (LOVENOX ) injection  40 mg Subcutaneous Q24H   folic acid   3 mg Oral Daily   levothyroxine   75 mcg Oral Q0600   metoprolol  succinate  25  mg Oral QHS   oxyCODONE -acetaminophen   1 tablet Oral Once   polyethylene glycol  17 g Oral Daily   predniSONE   2.5 mg Oral Daily   sertraline   25 mg Oral QHS   Continuous Infusions:   Anti-infectives (From admission, onward)    None              Family Communication/Anticipated D/C date and plan/Code Status   DVT prophylaxis: enoxaparin  (LOVENOX ) injection 40 mg Start: 09/07/24 1000     Code Status: Limited: Do not attempt resuscitation (DNR) -DNR-LIMITED -Do Not Intubate/DNI   Family Communication: Plan discussed with Kim Santiago, daughter, at the bedside Disposition Plan: Plan to discharge home versus SNF   Status is:  Observation The patient will require care spanning > 2 midnights and should be moved to inpatient because: Pain control for L4 vertebral fracture       Subjective:   Overall events noted.  Back pain is a little better today.  She is able to turn in bed today which she could not do yesterday.  Bari, daughter, at the bedside  Objective:    Vitals:   09/06/24 2120 09/07/24 0430 09/07/24 0750 09/07/24 1255  BP: 127/68 (!) 109/47 (!) 130/56   Pulse: 94 97 92   Resp: 17 17 16 16   Temp: 100.2 F (37.9 C) 99.1 F (37.3 C) 97.8 F (36.6 C)   TempSrc:   Oral   SpO2: 98% 94% 96%   Weight:      Height:       No data found.   Intake/Output Summary (Last 24 hours) at 09/07/2024 1332 Last data filed at 09/06/2024 1900 Gross per 24 hour  Intake 360 ml  Output --  Net 360 ml   Filed Weights   09/06/24 0610  Weight: 59 kg    Exam:  GEN: NAD SKIN: Warm and dry EYES: No pallor or icterus ENT: MMM CV: RRR PULM: CTA B ABD: soft, ND, NT, +BS CNS: AAO x 3, non focal EXT: No edema or tenderness MSK: Lumbar vertebral tenderness        Data Reviewed:   I have personally reviewed following labs and imaging studies:  Labs: Labs show the following:   Basic Metabolic Panel: Recent Labs  Lab 09/06/24 0626  NA 134*  K 4.2  CL 99  CO2 27  GLUCOSE 93  BUN 11  CREATININE 0.71  CALCIUM  9.0   GFR Estimated Creatinine Clearance: 41 mL/min (by C-G formula based on SCr of 0.71 mg/dL). Liver Function Tests: No results for input(s): AST, ALT, ALKPHOS, BILITOT, PROT, ALBUMIN in the last 168 hours. No results for input(s): LIPASE, AMYLASE in the last 168 hours. No results for input(s): AMMONIA in the last 168 hours. Coagulation profile No results for input(s): INR, PROTIME in the last 168 hours.  CBC: Recent Labs  Lab 09/06/24 0626  WBC 3.5*  NEUTROABS 1.5*  HGB 8.8*  HCT 25.9*  MCV 103.6*  PLT 195   Cardiac Enzymes: No results for  input(s): CKTOTAL, CKMB, CKMBINDEX, TROPONINI in the last 168 hours. BNP (last 3 results) No results for input(s): PROBNP in the last 8760 hours. CBG: No results for input(s): GLUCAP in the last 168 hours. D-Dimer: No results for input(s): DDIMER in the last 72 hours. Hgb A1c: No results for input(s): HGBA1C in the last 72 hours. Lipid Profile: No results for input(s): CHOL, HDL, LDLCALC, TRIG, CHOLHDL, LDLDIRECT in the last 72 hours. Thyroid  function studies: No results for input(s): TSH,  T4TOTAL, T3FREE, THYROIDAB in the last 72 hours.  Invalid input(s): FREET3 Anemia work up: No results for input(s): VITAMINB12, FOLATE, FERRITIN, TIBC, IRON , RETICCTPCT in the last 72 hours. Sepsis Labs: Recent Labs  Lab 09/06/24 0626  WBC 3.5*    Microbiology Recent Results (from the past 240 hours)  Urine Culture     Status: Abnormal   Collection Time: 09/06/24  6:21 AM   Specimen: Urine, Clean Catch  Result Value Ref Range Status   Specimen Description   Final    URINE, CLEAN CATCH Performed at [facility], [address] [place], [place] [address]    Special Requests   Final    NONE Performed at [facility], [address] [place], [place] [address]    Culture MULTIPLE SPECIES PRESENT, SUGGEST RECOLLECTION (A)  Final   Report Status 09/07/2024 FINAL  Final    Procedures and diagnostic studies:  MR LUMBAR SPINE WO CONTRAST Result Date: 09/06/2024 EXAM: MRI LUMBAR SPINE 09/06/2024 12:26:44 PM TECHNIQUE: Multiplanar multisequence MRI of the lumbar spine was performed without the administration of intravenous contrast. COMPARISON: Same day CT of the lumbar spine. CLINICAL HISTORY: Worsening lumbar pain. FINDINGS: BONES AND ALIGNMENT: Normal alignment. Chronic compression deformity of L1 with vertebral augmentation cement noted. Up to 40% height loss of L1 centrally. No significant retropulsion. Irregularity of  the L4 superior endplate with associated edema suggestive of acute compression fracture. Approximately 10% height loss of L4. No significant retropulsion. Schmorl nodes at multiple levels. Vertebral body heights otherwise maintained. Heterogeneous bone marrow signal intensity. SPINAL CORD: The conus medullaris extends to the L1-L2 level. SOFT TISSUES: No paraspinal mass. Mild atrophy of the paraspinal musculature. T12-L1: Small disc bulge. Mild to moderate facet arthrosis. Mild lateral recess narrowing. Mild spinal canal stenosis. No significant foraminal stenosis. L1-L2: Small disc bulge. Mild facet arthrosis. Thickening of the ligamentum flavum. Mild lateral recess narrowing. No significant spinal canal or foraminal stenosis. L2-L3: Diffuse disc bulge. Mild to moderate facet arthrosis. Thickening of the ligamentum flavum. Mild lateral recess narrowing. No significant spinal canal or foraminal stenosis. L3-L4: Diffuse disc bulge. Moderate facet arthrosis. Thickening of the ligamentum flavum. Lateral recess narrowing. Moderate spinal canal stenosis. Potential impingement of the traversing L4 nerve roots. No significant foraminal stenosis. L4-L5: Mild disc height loss. Diffuse disc bulge. Moderate to severe facet arthrosis. Thickening of the ligamentum flavum. Trace facet effusion on the right. Moderate to severe spinal canal stenosis with impingement of the traversing L5 nerve roots. No significant foraminal stenosis. L5-S1: Disc desiccation. Mild disc height loss posteriorly. Small disc bulge and central disc protrusion. Right subarticular annular fissure. Moderate facet arthrosis. Thickening of the ligamentum flavum. Mild lateral recess narrowing without significant spinal canal stenosis. No significant foraminal stenosis. IMPRESSION: 1. Acute compression fracture of L4 with approximately 10% height loss and associated edema. No significant retropulsion. 2. Moderate to severe spinal canal stenosis at L4-L5 with  impingement of the traversing L5 nerve roots. 3. Additional moderate spinal canal stenosis at L3-4 with impingement of the traversing L4 nerve roots. 4. Chronic compression fracture of L1 status post kyphoplasty. Electronically signed by: Donnice Mania MD 09/06/2024 01:09 PM EST RP Workstation: [id number]   CT L-SPINE NO CHARGE Result Date: 09/06/2024 EXAM: CT OF THE LUMBAR SPINE WITHOUT CONTRAST 09/06/2024 07:24:11 AM TECHNIQUE: CT of the lumbar spine was performed without the administration of intravenous contrast. Multiplanar reformatted images are provided for review. Automated exposure control, iterative reconstruction, and/or weight based adjustment of the mA/kV was utilized  to reduce the radiation dose to as low as reasonably achievable. COMPARISON: MRI of the lumbar spine dated 10/15/2006. CLINICAL HISTORY: FINDINGS: BONES AND ALIGNMENT: Vertebral augmentation at L1. There is a mild-to-moderate compression deformity at L1, mildly progressed in the interim. Mild dextroscoliosis. Mild downward bowing of the superior endplate of L4. No acute fracture or suspicious bone lesion. DEGENERATIVE CHANGES: Moderate diffuse chronic degenerative disc disease present at L2-3, L3-4 and L4-5. At L3-4 and L4-5, there is also moderate-to-severe central spinal canal stenosis and bilateral lateral recess stenosis, with likely impingement of the transiting nerves in the lateral recesses bilaterally. SOFT TISSUES: Moderate calcific atheromatous disease within the abdominal aorta. IMPRESSION: 1. Mild-to-moderate compression deformity at L1, mildly progressed in the interim, status post vertebral augmentation. 2. Mild dextroscoliosis and mild downward bowing of the superior endplate of L4. 3. Moderate-to-severe central spinal canal stenosis and bilateral lateral recess stenosis at L3-4 and L4-5, with likely impingement of the transiting nerves in the lateral recesses bilaterally. 4. Moderate diffuse chronic degenerative disc  disease at L2-3, L3-4, and L4-5. Electronically signed by: Evalene Coho MD 09/06/2024 08:07 AM EST RP Workstation: [id number]   CT Renal Stone Study Result Date: 09/06/2024 EXAM: CT UROGRAM 09/06/2024 07:24:11 AM TECHNIQUE: CT of the abdomen and pelvis was performed without the administration of intravenous contrast as per CT urogram protocol. Multiplanar reformatted images as well as MIP urogram images are provided for review. Automated exposure control, iterative reconstruction, and/or weight based adjustment of the mA/kV was utilized to reduce the radiation dose to as low as reasonably achievable. COMPARISON: 02/28/2018 CLINICAL HISTORY: Abdominal/flank pain, stone suspected; severe left sided lower back pain. FINDINGS: LOWER CHEST: Bibasilar peripheral predominant interstitial reticulation noted, likely reflecting sequelae of chronic inflammation. LIVER: The liver is unremarkable. GALLBLADDER AND BILE DUCTS: Status post cholecystectomy. No biliary ductal dilatation. SPLEEN: No acute abnormality. PANCREAS: No acute abnormality. ADRENAL GLANDS: No acute abnormality. KIDNEYS, URETERS AND BLADDER: No stones in the kidneys or ureters. No hydronephrosis. No perinephric or periureteral stranding. Urinary bladder is unremarkable. GI AND BOWEL: Small hiatal hernia. No small bowel dilatation. Liquid stool with fluid-air levels identified within the colon, which may reflect an underlying diarrheal illness. No signs of bowel wall thickening, inflammation, or distention noted. The appendix is not completely visualized, identified separate from the right lower quadrant bowel loops. No secondary signs of acute appendicitis. PERITONEUM AND RETROPERITONEUM: No ascites. No free air. VASCULATURE: Aorta is normal in caliber. Aortic atherosclerotic calcification. LYMPH NODES: No lymphadenopathy. REPRODUCTIVE ORGANS: Uterus appears normal. No adnexal mass identified. BONES AND SOFT TISSUES: The bones are diffusely osteopenic.  Unchanged cement augmentation of the L1 vertebral compression deformity. New mild superior endplate compression deformity involving the L4 vertebra. This is age indeterminate but is new from 02/28/2018. No focal soft tissue abnormality. IMPRESSION: 1. No nephrolithiasis, obstructive uropathy, or mass. 2. New mild superior endplate compression deformity involving the L4 vertebra, age indeterminate. 3. Liquid stool with fluid-air levels identified within the colon, possibly reflecting an underlying diarrheal illness, without bowel wall thickening, inflammation, or distention. Electronically signed by: Waddell Calk MD 09/06/2024 08:02 AM EST RP Workstation: [id number]               LOS: 0 days   Alece Koppel  [facility]   Pager on [email or web address]. If 7PM-7AM, please contact night-coverage at [email or web address]     09/07/2024, 1:32 PM           "

## 2024-09-07 NOTE — Plan of Care (Signed)

## 2024-09-08 DIAGNOSIS — S32049A Unspecified fracture of fourth lumbar vertebra, initial encounter for closed fracture: Secondary | ICD-10-CM | POA: Diagnosis not present

## 2024-09-08 NOTE — TOC Initial Note (Signed)
 Transition of Care [facility]) - Initial/Assessment Note    Patient Details  Name: Kim Santiago MRN: [id number] Date of Birth: 07-Jun-1935  [facility]) CM/SW Contact:    Alvaro Louder, LCSW Phone Number: 09/08/2024, 12:14 PM  Clinical Narrative:     Per chart review patient is from home PCP is Verneita Kettering, LCSWA faxed out patient to SNF's in [place]. TOC to present facilities to POA.             TOC to follow for discharge        Patient Goals and CMS Choice            Expected Discharge Plan and Services                                              Prior Living Arrangements/Services                       Activities of Daily Living   ADL Screening (condition at time of admission) Independently performs ADLs?: No Does the patient have a NEW difficulty with bathing/dressing/toileting/self-feeding that is expected to last >3 days?: No Does the patient have a NEW difficulty with getting in/out of bed, walking, or climbing stairs that is expected to last >3 days?: No Does the patient have a NEW difficulty with communication that is expected to last >3 days?: No Is the patient deaf or have difficulty hearing?: No Does the patient have difficulty seeing, even when wearing glasses/contacts?: No Does the patient have difficulty concentrating, remembering, or making decisions?: Yes  Permission Sought/Granted                  Emotional Assessment              Admission diagnosis:  Intractable pain [R52] Closed L4 vertebral fracture (HCC) [S32.049A] Closed compression fracture of L4 lumbar vertebra, initial encounter (HCC) [S32.040A] Patient Active Problem List   Diagnosis Date Noted   Closed L4 vertebral fracture (HCC) 09/06/2024   Thrombocytopenia 08/29/2024   Depression 08/29/2024   Sepsis (HCC) 08/29/2024   Influenza A with pneumonia 08/28/2024   Hyperkalemia due to angiotensin converting enzyme inhibitor (ACE-I)  07/02/2024   Radiculitis, cervical 06/06/2024   Drug-induced systemic lupus erythematosus, unspecified organ involvement status 06/06/2024   Stage 3a chronic kidney disease (HCC) 06/06/2024   Gastritis 09/11/2023   Chronic low back pain with bilateral sciatica 09/11/2023   Gait instability 04/21/2022   Iron  deficiency anemia 12/23/2021   Frequent epistaxis 12/23/2021   Chronic diastolic heart failure (HCC) 06/19/2021   COVID-19 virus infection 04/20/2021   Generalized weakness 11/23/2020   Vaccine reaction, initial encounter 08/20/2020   Hemiplegia affecting dominant side, post-stroke (HCC) 01/16/2020   Does use hearing aid 07/14/2019   Impaired balance as late effect of cerebrovascular accident 11/13/2018   History of CVA with residual deficit 11/13/2018   Leukopenia 11/11/2018   Anxiety about health 07/05/2018   Mediastinal adenopathy 04/27/2018   Edema due to hypoalbuminemia 02/20/2018   Hospital discharge follow-up 02/20/2018   History of vertebral fracture 02/18/2018   GERD (gastroesophageal reflux disease) 10/31/2017   Hand joint pain 06/13/2017   Vaginal atrophy 05/11/2017   Venous insufficiency of both lower extremities 01/10/2017   Hyponatremia 10/07/2016   Subacute cutaneous lupus erythematosus 06/26/2016   Anemia of chronic disease 05/24/2016  Recurrent UTI 04/29/2016   TMJ syndrome 03/31/2016   Hearing loss 03/12/2016   Osteopenia 12/05/2015   Hx of thyroid  cancer 06/12/2014   Adenomatous polyp 06/12/2014   Grief 04/08/2014   Sjogren's syndrome 03/09/2014   Insomnia due to stress 03/06/2014   Hyperlipidemia 12/28/2012   Medicare annual wellness visit, subsequent 12/28/2012   Essential hypertension 10/02/2011   Iatrogenic hypothyroidism 10/02/2011   Purpura 05/25/2011   PCP:  Marylynn Verneita CROME, MD Pharmacy:   [facility] [address], [place] - [address] [place] [address] Phone: [phone] Fax: [phone]     Social Drivers of  Health (SDOH) Social History: SDOH Screenings   Food Insecurity: No Food Insecurity (09/06/2024)  Housing: Unknown (09/06/2024)  Transportation Needs: Unknown (09/06/2024)  Utilities: Not At Risk (09/06/2024)  Alcohol Screen: Low Risk (04/18/2024)  Depression (PHQ2-9): High Risk (09/01/2024)  Financial Resource Strain: Low Risk (04/18/2024)  Physical Activity: Insufficiently Active (04/18/2024)  Social Connections: Moderately Isolated (09/06/2024)  Stress: No Stress Concern Present (04/18/2024)  Tobacco Use: Low Risk (09/06/2024)  Health Literacy: Adequate Health Literacy (04/18/2024)   SDOH Interventions: Social Connections Interventions: Inpatient TOC, Other (Comment) (Resources added to AVS)   Readmission Risk Interventions     No data to display

## 2024-09-08 NOTE — Evaluation (Signed)
 Physical Therapy Evaluation Patient Details Name: Kim Santiago MRN: [id number] DOB: [birth year] Today's Date: 09/08/2024  History of Present Illness  Pt is an 89 year old female admitted with Acute closed L4 vertebral fracture    PMH significant for HTN, hypothyroidism, HLD, Sjogren's syndrome, SLE, depression, GERD, recent hospitalization from 08/28/2024 through 08/30/2024 for influenza A pneumonia and possibly bacterial pneumonia, chronic compression fracture of L1 s/p kyphoplasty.   Clinical Impression  Patient alert, oriented to self, reported 8/10 L shoulder/neck pain. At baseline the patient lives with family utilized her rollator. Pt instructed in log roll technique, modA to complete. MaxA to don TLSO and adjusted for comfort, doffed after ambulation. She ambulated ~32ft in room with RW, but needed minA throughout due to R lean, fatigue.  Overall the patient demonstrated deficits (see PT Problem List) that impede the patient's functional abilities, safety, and mobility and would benefit from skilled PT intervention.        If plan is discharge home, recommend the following: A little help with walking and/or transfers;A little help with bathing/dressing/bathroom;Assist for transportation;Help with stairs or ramp for entrance   Can travel by private vehicle   Yes    Equipment Recommendations Other (comment) (TBD)  Recommendations for Other Services       Functional Status Assessment Patient has had a recent decline in their functional status and demonstrates the ability to make significant improvements in function in a reasonable and predictable amount of time.     Precautions / Restrictions Precautions Precautions: Fall Required Braces or Orthoses: Spinal Brace Spinal Brace: Thoracolumbosacral orthotic;Other (comment) Spinal Brace Comments: for comfort Restrictions Weight Bearing Restrictions Per Provider Order: No      Mobility  Bed Mobility Overal bed mobility:  Needs Assistance Bed Mobility: Rolling, Sidelying to Sit Rolling: Mod assist, Used rails Sidelying to sit: Mod assist, HOB elevated Supine to sit: Min assist, HOB elevated, Used rails Sit to supine: Supervision   General bed mobility comments: frequent cueing for technique, increased time due to L shoudler pain    Transfers Overall transfer level: Needs assistance Equipment used: Rolling walker (2 wheels) Transfers: Sit to/from Stand Sit to Stand: Mod assist, +2 physical assistance           General transfer comment: frequent vcs for technique    Ambulation/Gait Ambulation/Gait assistance: Min assist Gait Distance (Feet): 12 Feet Assistive device: Rolling walker (2 wheels) Gait Pattern/deviations: Decreased step length - right, Decreased step length - left, Decreased stride length, Shuffle       General Gait Details: MinA for mobility due to LOB  Stairs            Wheelchair Mobility     Tilt Bed    Modified Rankin (Stroke Patients Only)       Balance Overall balance assessment: Needs assistance Sitting-balance support: Feet supported Sitting balance-Leahy Scale: Fair Sitting balance - Comments: no LOB during LB dressing on toilet     Standing balance-Leahy Scale: Poor                               Pertinent Vitals/Pain Pain Assessment Pain Assessment: 0-10 Pain Score: 8  Pain Location: L shoulder Pain Descriptors / Indicators: Discomfort, Grimacing Pain Intervention(s): Limited activity within patient's tolerance, Monitored during session, Repositioned, Premedicated before session    Home Living Family/patient expects to be discharged to:: Private residence Living Arrangements: Children Available Help at Discharge: Family;Available PRN/intermittently (pt is  home for a few hrs alone, can get 24/7 per daughter) Type of Home: House Home Access: Level entry       Home Layout: Two level;Able to live on main level with  bedroom/bathroom Home Equipment: Toilet riser;Rollator (4 wheels);BSC/3in1;Grab bars - tub/shower;Rolling Walker (2 wheels)      Prior Function Prior Level of Function : Needs assist             Mobility Comments: PRN use of furniture walking vs rollator, 1 fall the night prior to admission ADLs Comments: generally MOD I with feeding,grooming, dressing, bathing, toileting (sits on bsc in the shower); PRN assist from daugther, assist for IADLs     Extremity/Trunk Assessment   Upper Extremity Assessment Upper Extremity Assessment: Generalized weakness    Lower Extremity Assessment Lower Extremity Assessment: Defer to PT evaluation    Cervical / Trunk Assessment Cervical / Trunk Assessment: Kyphotic  Communication   Communication Communication: Impaired Factors Affecting Communication: Hearing impaired    Cognition Arousal: Alert Behavior During Therapy: WFL for tasks assessed/performed   PT - Cognitive impairments: No apparent impairments                         Following commands: Intact       Cueing Cueing Techniques: Verbal cues     General Comments General comments (skin integrity, edema, etc.): vss throughout, TLSO doffed in chair post session    Exercises     Assessment/Plan    PT Assessment Patient needs continued PT services  PT Problem List Decreased strength;Cardiopulmonary status limiting activity;Decreased range of motion;Decreased activity tolerance;Decreased mobility;Decreased balance;Decreased knowledge of use of DME;Decreased safety awareness       PT Treatment Interventions DME instruction;Therapeutic exercise;Gait training;Balance training;Stair training;Neuromuscular re-education;Functional mobility training;Patient/family education;Therapeutic activities    PT Goals (Current goals can be found in the Care Plan section)  Acute Rehab PT Goals Patient Stated Goal: to get better PT Goal Formulation: With patient Time For Goal  Achievement: 09/22/24 Potential to Achieve Goals: Good    Frequency Min 2X/week     Co-evaluation PT/OT/SLP Co-Evaluation/Treatment: Yes Reason for Co-Treatment: Complexity of the patient's impairments (multi-system involvement);To address functional/ADL transfers;For patient/therapist safety PT goals addressed during session: Mobility/safety with mobility OT goals addressed during session: ADL's and self-care       AM-PAC PT 6 Clicks Mobility  Outcome Measure Help needed turning from your back to your side while in a flat bed without using bedrails?: A Little Help needed moving from lying on your back to sitting on the side of a flat bed without using bedrails?: A Little Help needed moving to and from a bed to a chair (including a wheelchair)?: A Little Help needed standing up from a chair using your arms (e.g., wheelchair or bedside chair)?: A Little Help needed to walk in hospital room?: A Little Help needed climbing 3-5 steps with a railing? : A Little 6 Click Score: 18    End of Session   Activity Tolerance: Patient tolerated treatment well Patient left: in chair;with call bell/phone within reach;with chair alarm set Nurse Communication: Mobility status PT Visit Diagnosis: Difficulty in walking, not elsewhere classified (R26.2);Other abnormalities of gait and mobility (R26.89);Muscle weakness (generalized) (M62.81)    Time: 9061-8995 PT Time Calculation (min) (ACUTE ONLY): 26 min   Charges:   PT Evaluation $PT Eval Low Complexity: 1 Low PT Treatments $Therapeutic Activity: 8-22 mins PT General Charges $$ ACUTE PT VISIT: 1 Visit  Doyal Shams PT, DPT 1:24 PM,09/08/2024

## 2024-09-08 NOTE — [place] FL2 (Signed)
 " [place]  MEDICAID FL2 LEVEL OF CARE FORM     IDENTIFICATION  Patient Name: Kim Santiago Birthdate: 06-30-1935 Sex: female Admission Date (Current Location): 09/06/2024  [facility] and [place] Number:  Chiropodist and Address:  [facility], [address], [place], [place] [address]      Provider Number: [id number]  Attending Physician Name and Address:  Jens Durand, MD  Relative Name and Phone Number:       Current Level of Care: Hospital Recommended Level of Care: Skilled Nursing Facility Prior Approval Number:    Date Approved/Denied:   PASRR Number: [id number]  Discharge Plan: SNF    Current Diagnoses: Patient Active Problem List   Diagnosis Date Noted   Closed L4 vertebral fracture (HCC) 09/06/2024   Thrombocytopenia 08/29/2024   Depression 08/29/2024   Sepsis (HCC) 08/29/2024   Influenza A with pneumonia 08/28/2024   Hyperkalemia due to angiotensin converting enzyme inhibitor (ACE-I) 07/02/2024   Radiculitis, cervical 06/06/2024   Drug-induced systemic lupus erythematosus, unspecified organ involvement status 06/06/2024   Stage 3a chronic kidney disease (HCC) 06/06/2024   Gastritis 09/11/2023   Chronic low back pain with bilateral sciatica 09/11/2023   Gait instability 04/21/2022   Iron  deficiency anemia 12/23/2021   Frequent epistaxis 12/23/2021   Chronic diastolic heart failure (HCC) 06/19/2021   COVID-19 virus infection 04/20/2021   Generalized weakness 11/23/2020   Vaccine reaction, initial encounter 08/20/2020   Hemiplegia affecting dominant side, post-stroke (HCC) 01/16/2020   Does use hearing aid 07/14/2019   Impaired balance as late effect of cerebrovascular accident 11/13/2018   History of CVA with residual deficit 11/13/2018   Leukopenia 11/11/2018   Anxiety about health 07/05/2018   Mediastinal adenopathy 04/27/2018   Edema due to hypoalbuminemia 02/20/2018   Hospital discharge follow-up 02/20/2018    History of vertebral fracture 02/18/2018   GERD (gastroesophageal reflux disease) 10/31/2017   Hand joint pain 06/13/2017   Vaginal atrophy 05/11/2017   Venous insufficiency of both lower extremities 01/10/2017   Hyponatremia 10/07/2016   Subacute cutaneous lupus erythematosus 06/26/2016   Anemia of chronic disease 05/24/2016   Recurrent UTI 04/29/2016   TMJ syndrome 03/31/2016   Hearing loss 03/12/2016   Osteopenia 12/05/2015   Hx of thyroid  cancer 06/12/2014   Adenomatous polyp 06/12/2014   Grief 04/08/2014   Sjogren's syndrome 03/09/2014   Insomnia due to stress 03/06/2014   Hyperlipidemia 12/28/2012   Medicare annual wellness visit, subsequent 12/28/2012   Essential hypertension 10/02/2011   Iatrogenic hypothyroidism 10/02/2011   Purpura 05/25/2011    Orientation RESPIRATION BLADDER Height & Weight     Self, Time, Place  Normal Continent Weight: 130 lb (59 kg) Height:  5' 3 (160 cm)  BEHAVIORAL SYMPTOMS/MOOD NEUROLOGICAL BOWEL NUTRITION STATUS      Continent Diet (Heart)  AMBULATORY STATUS COMMUNICATION OF NEEDS Skin   Limited Assist Verbally Normal                       Personal Care Assistance Level of Assistance  Bathing, Feeding, Dressing Bathing Assistance: Limited assistance Feeding assistance: Independent Dressing Assistance: Limited assistance     Functional Limitations Info  Sight, Speech, Hearing Sight Info: Impaired Hearing Info: Adequate Speech Info: Adequate    SPECIAL CARE FACTORS FREQUENCY  PT (By licensed PT), OT (By licensed OT)     PT Frequency: 5x/week OT Frequency: 5x/week            Contractures  Additional Factors Info  Code Status, Allergies Code Status Info: Full Allergies Info: Naprosyn (Naproxen), Amoxicillin , Azathioprine , Codeine, Hydroxychloroquine, Mycophenolate Mofetil, Orudis (Ketoprofen), Sulfa Antibiotics, Sulfathiazole           Current Medications (09/08/2024):  This is the current hospital active  medication list Current Facility-Administered Medications  Medication Dose Route Frequency Provider Last Rate Last Admin   acetaminophen  (TYLENOL ) tablet 650 mg  650 mg Oral Q6H PRN Jens Durand, MD   650 mg at 09/07/24 9071   Or   acetaminophen  (TYLENOL ) suppository 650 mg  650 mg Rectal Q6H PRN Jens Durand, MD       atorvastatin  (LIPITOR) tablet 20 mg  20 mg Oral QHS Jens Durand, MD   20 mg at 09/07/24 2050   enoxaparin  (LOVENOX ) injection 40 mg  40 mg Subcutaneous Q24H Jens Durand, MD   40 mg at 09/08/24 1004   folic acid  (FOLVITE ) tablet 3 mg  3 mg Oral Daily Jens Durand, MD   3 mg at 09/08/24 1005   guaiFENesin -dextromethorphan (ROBITUSSIN DM) 100-10 MG/5ML syrup 5 mL  5 mL Oral Q4H PRN Jens Durand, MD   5 mL at 09/07/24 9078   levothyroxine  (SYNTHROID ) tablet 75 mcg  75 mcg Oral Q0600 Jens Durand, MD   75 mcg at 09/08/24 9375   metoprolol  succinate (TOPROL -XL) 24 hr tablet 25 mg  25 mg Oral QHS Ayiku, Bernard, MD   25 mg at 09/07/24 2052   ondansetron  (ZOFRAN ) tablet 4 mg  4 mg Oral Q6H PRN Jens Durand, MD       Or   ondansetron  (ZOFRAN ) injection 4 mg  4 mg Intravenous Q6H PRN Jens Durand, MD       oxyCODONE  (Oxy IR/ROXICODONE ) immediate release tablet 5 mg  5 mg Oral Q6H PRN Jens Durand, MD   5 mg at 09/08/24 0803   oxyCODONE -acetaminophen  (PERCOCET/ROXICET) 5-325 MG per tablet 1 tablet  1 tablet Oral Once Mulvihill, Caitlin M, MD       polyethylene glycol (MIRALAX  / GLYCOLAX ) packet 17 g  17 g Oral Daily Jens Durand, MD   17 g at 09/08/24 1004   predniSONE  (DELTASONE ) tablet 2.5 mg  2.5 mg Oral Daily Jens Durand, MD   2.5 mg at 09/08/24 1010   senna-docusate (Senokot-S) tablet 1 tablet  1 tablet Oral QHS PRN Jens Durand, MD   1 tablet at 09/07/24 2051   sertraline  (ZOLOFT ) tablet 25 mg  25 mg Oral QHS Jens Durand, MD   25 mg at 09/07/24 2051     Discharge Medications: Please see discharge summary for a list of discharge  medications.  Relevant Imaging Results:  Relevant Lab Results:   Additional Information SSN: [id number]  Alvaro Louder, LCSW     "

## 2024-09-08 NOTE — Plan of Care (Signed)

## 2024-09-08 NOTE — TOC Progression Note (Signed)
 Transition of Care [facility]) - Progression Note    Patient Details  Name: Kim Santiago MRN: [id number] Date of Birth: [birth year]  [facility] CM/SW Contact  Alvaro Louder, [place] Phone Number: 09/08/2024, 2:56 PM  Clinical Narrative:   LCSWA spoke to POA. They indicated that they wanted SNF [facility]. LCSWA reached out to [employer] to start Auth.   TOC to follow for discharge                      Expected Discharge Plan and Services                                               Social Drivers of Health (SDOH) Interventions SDOH Screenings   Food Insecurity: No Food Insecurity (09/06/2024)  Housing: Unknown (09/06/2024)  Transportation Needs: Unknown (09/06/2024)  Utilities: Not At Risk (09/06/2024)  Alcohol Screen: Low Risk (04/18/2024)  Depression (PHQ2-9): High Risk (09/01/2024)  Financial Resource Strain: Low Risk (04/18/2024)  Physical Activity: Insufficiently Active (04/18/2024)  Social Connections: Moderately Isolated (09/06/2024)  Stress: No Stress Concern Present (04/18/2024)  Tobacco Use: Low Risk (09/06/2024)  Health Literacy: Adequate Health Literacy (04/18/2024)    Readmission Risk Interventions     No data to display

## 2024-09-08 NOTE — Progress Notes (Addendum)
 "    Progress Note    Kim Santiago  FMW:[id number] DOB: 1935/02/16  DOA: 09/06/2024 PCP: Kim Verneita CROME, MD      Brief Narrative:    Medical records reviewed and are as summarized below:  Kim Santiago is a 89 y.o. female with medical history significant for HTN, hypothyroidism, HLD, Sjogren's syndrome, SLE, depression, GERD, recent hospitalization from 08/28/2024 through 08/30/2024 for influenza A pneumonia and possibly bacterial pneumonia.  She had a fall on 08/27/2024 according to her daughter when she slid out of her bed onto the ground.  She developed low back pain as a result of the fall.  However, at that time, she was having significant respiratory symptoms so she did not pay much attention to her pain.  After recent discharge from the hospital, patient began to complain of significant low back pain.  Initially, she was able to ambulate with a walker.  However, pain got worse to the point that she could no longer ambulate.  Pain is sharp and severe and nonradiating.  No known relieving factors and is worse with little movement. She still has a cough from recent lung infection.  She is able to move her lower extremities.  No fever, chills, shortness of breath, chest pain, vomiting, GI or urinary symptoms.     ED Course: Vital signs temperature 99.3 F, respiratory 18, pulse 72, BP 154/73, oxygen  saturation 100% on room air.     MRI lumbar spine IMPRESSION: 1. Acute compression fracture of L4 with approximately 10% height loss and associated edema. No significant retropulsion. 2. Moderate to severe spinal canal stenosis at L4-L5 with impingement of the traversing L5 nerve roots. 3. Additional moderate spinal canal stenosis at L3-4 with impingement of the traversing L4 nerve roots. 4. Chronic compression fracture of L1 status post kyphoplasty.       The patient was given IV fentanyl , IV morphine and IV Zofran  in the ED.       Assessment/Plan:   Principal  Problem:   Closed L4 vertebral fracture (HCC)    Body mass index is 23.03 kg/m.   Acute closed L4 vertebral fracture, most severe acute on chronic low back pain, s/p fall at home: Use Tylenol  and oxycodone  as needed for pain.  Minimize use of morphine as able.  Continue TLSO brace for comfort.  OT recommended discharge to SNF.  Patient is agreeable to go to SNF.  Consult TOC to assist with placement.     Cough from recent influenza A pneumonia: Robitussin as needed for cough. Positive influenza A test on 08/28/2024     Drug-induced SLE: Continue low-dose chronic prednisone .     Chronic diastolic CHF: Compensated   Mild hyponatremia: Asymptomatic.   Mild low-grade fever overnight with temperature of 100.5 F.  Continue to monitor. Pyuria: No urinary symptoms.   Thrombocytopenia: Improved     Comorbidities include chronic anemia, hypertension, depression, chronic moderate to severe lumbar spinal stenosis, chronic compression fracture of L1 s/p kyphoplasty   Plan discussed with Bari, daughter, at the bedside.   Diet Order             Diet Heart Fluid consistency: Thin  Diet effective now                                  Consultants: Neurosurgeon  Procedures: None    Medications:    atorvastatin   20 mg Oral QHS  enoxaparin  (LOVENOX ) injection  40 mg Subcutaneous Q24H   folic acid   3 mg Oral Daily   levothyroxine   75 mcg Oral Q0600   metoprolol  succinate  25 mg Oral QHS   oxyCODONE -acetaminophen   1 tablet Oral Once   polyethylene glycol  17 g Oral Daily   predniSONE   2.5 mg Oral Daily   sertraline   25 mg Oral QHS   Continuous Infusions:   Anti-infectives (From admission, onward)    None              Family Communication/Anticipated D/C date and plan/Code Status   DVT prophylaxis: enoxaparin  (LOVENOX ) injection 40 mg Start: 09/07/24 1000     Code Status: Limited: Do not attempt resuscitation (DNR) -DNR-LIMITED  -Do Not Intubate/DNI   Family Communication: Plan discussed with Christy, daughter, at the bedside Disposition Plan: Plan to discharge to SNF   Status is: Observation The patient will require care spanning > 2 midnights and should be moved to inpatient because: Pain control for L4 vertebral fracture       Subjective:   Interval events noted.  She still has some back pain but she was able to work with OT this morning.  She also complains of left-sided neck pain.  She thinks she might have positioned herself poorly while sleeping last night.  Of note, she said she had been having intermittent chronic neck pain prior to admission.  Bari, daughter, at the bedside  Objective:    Vitals:   09/07/24 1628 09/07/24 2040 09/08/24 0358 09/08/24 0841  BP: (!) 120/55 (!) 115/51 113/64 (!) 132/55  Pulse: 78 92 93 94  Resp: 17 18 19 18   Temp: 98.6 F (37 C) 100.1 F (37.8 C) (!) 100.5 F (38.1 C) 98.2 F (36.8 C)  TempSrc: Oral Oral Oral   SpO2: 100% 98% 96% 96%  Weight:      Height:       No data found.   Intake/Output Summary (Last 24 hours) at 09/08/2024 1045 Last data filed at 09/08/2024 0355 Gross per 24 hour  Intake 240 ml  Output 1000 ml  Net -760 ml   Filed Weights   09/06/24 0610  Weight: 59 kg    Exam:  GEN: NAD, sitting up in the chair SKIN: Warm and dry EYES: No pallor or icterus ENT: MMM CV: RRR PULM: CTA B ABD: soft, ND, NT, +BS CNS: AAO x 3, non focal EXT: No edema or tenderness MSK: Lumbar vertebral tenderness.  Some tenderness on the left trapezius muscle anteriorly      Data Reviewed:   I have personally reviewed following labs and imaging studies:  Labs: Labs show the following:   Basic Metabolic Panel: Recent Labs  Lab 09/06/24 0626  NA 134*  K 4.2  CL 99  CO2 27  GLUCOSE 93  BUN 11  CREATININE 0.71  CALCIUM  9.0   GFR Estimated Creatinine Clearance: 41 mL/min (by C-G formula based on SCr of 0.71 mg/dL). Liver Function  Tests: No results for input(s): AST, ALT, ALKPHOS, BILITOT, PROT, ALBUMIN in the last 168 hours. No results for input(s): LIPASE, AMYLASE in the last 168 hours. No results for input(s): AMMONIA in the last 168 hours. Coagulation profile No results for input(s): INR, PROTIME in the last 168 hours.  CBC: Recent Labs  Lab 09/06/24 0626  WBC 3.5*  NEUTROABS 1.5*  HGB 8.8*  HCT 25.9*  MCV 103.6*  PLT 195   Cardiac Enzymes: No results for input(s): CKTOTAL, CKMB, CKMBINDEX,  TROPONINI in the last 168 hours. BNP (last 3 results) No results for input(s): PROBNP in the last 8760 hours. CBG: No results for input(s): GLUCAP in the last 168 hours. D-Dimer: No results for input(s): DDIMER in the last 72 hours. Hgb A1c: No results for input(s): HGBA1C in the last 72 hours. Lipid Profile: No results for input(s): CHOL, HDL, LDLCALC, TRIG, CHOLHDL, LDLDIRECT in the last 72 hours. Thyroid  function studies: No results for input(s): TSH, T4TOTAL, T3FREE, THYROIDAB in the last 72 hours.  Invalid input(s): FREET3 Anemia work up: No results for input(s): VITAMINB12, FOLATE, FERRITIN, TIBC, IRON , RETICCTPCT in the last 72 hours. Sepsis Labs: Recent Labs  Lab 09/06/24 0626  WBC 3.5*    Microbiology Recent Results (from the past 240 hours)  Urine Culture     Status: Abnormal   Collection Time: 09/06/24  6:21 AM   Specimen: Urine, Clean Catch  Result Value Ref Range Status   Specimen Description   Final    URINE, CLEAN CATCH Performed at [facility], [address], [place], [place] [address]    Special Requests   Final    NONE Performed at [facility], [address] [place], [place] [address]    Culture MULTIPLE SPECIES PRESENT, SUGGEST RECOLLECTION (A)  Final   Report Status 09/07/2024 FINAL  Final    Procedures and diagnostic studies:  MR LUMBAR SPINE WO CONTRAST Result Date:  09/06/2024 EXAM: MRI LUMBAR SPINE 09/06/2024 12:26:44 PM TECHNIQUE: Multiplanar multisequence MRI of the lumbar spine was performed without the administration of intravenous contrast. COMPARISON: Same day CT of the lumbar spine. CLINICAL HISTORY: Worsening lumbar pain. FINDINGS: BONES AND ALIGNMENT: Normal alignment. Chronic compression deformity of L1 with vertebral augmentation cement noted. Up to 40% height loss of L1 centrally. No significant retropulsion. Irregularity of the L4 superior endplate with associated edema suggestive of acute compression fracture. Approximately 10% height loss of L4. No significant retropulsion. Schmorl nodes at multiple levels. Vertebral body heights otherwise maintained. Heterogeneous bone marrow signal intensity. SPINAL CORD: The conus medullaris extends to the L1-L2 level. SOFT TISSUES: No paraspinal mass. Mild atrophy of the paraspinal musculature. T12-L1: Small disc bulge. Mild to moderate facet arthrosis. Mild lateral recess narrowing. Mild spinal canal stenosis. No significant foraminal stenosis. L1-L2: Small disc bulge. Mild facet arthrosis. Thickening of the ligamentum flavum. Mild lateral recess narrowing. No significant spinal canal or foraminal stenosis. L2-L3: Diffuse disc bulge. Mild to moderate facet arthrosis. Thickening of the ligamentum flavum. Mild lateral recess narrowing. No significant spinal canal or foraminal stenosis. L3-L4: Diffuse disc bulge. Moderate facet arthrosis. Thickening of the ligamentum flavum. Lateral recess narrowing. Moderate spinal canal stenosis. Potential impingement of the traversing L4 nerve roots. No significant foraminal stenosis. L4-L5: Mild disc height loss. Diffuse disc bulge. Moderate to severe facet arthrosis. Thickening of the ligamentum flavum. Trace facet effusion on the right. Moderate to severe spinal canal stenosis with impingement of the traversing L5 nerve roots. No significant foraminal stenosis. L5-S1: Disc desiccation.  Mild disc height loss posteriorly. Small disc bulge and central disc protrusion. Right subarticular annular fissure. Moderate facet arthrosis. Thickening of the ligamentum flavum. Mild lateral recess narrowing without significant spinal canal stenosis. No significant foraminal stenosis. IMPRESSION: 1. Acute compression fracture of L4 with approximately 10% height loss and associated edema. No significant retropulsion. 2. Moderate to severe spinal canal stenosis at L4-L5 with impingement of the traversing L5 nerve roots. 3. Additional moderate spinal canal stenosis at L3-4 with impingement of the traversing L4  nerve roots. 4. Chronic compression fracture of L1 status post kyphoplasty. Electronically signed by: Donnice Mania MD 09/06/2024 01:09 PM EST RP Workstation: [id number]               LOS: 0 days   Lael Pilch  [facility]   Pager on [email or web address]. If 7PM-7AM, please contact night-coverage at [email or web address]     09/08/2024, 10:45 AM           "

## 2024-09-08 NOTE — Evaluation (Signed)
 Occupational Therapy Evaluation Patient Details Name: Kim Santiago MRN: [id number] DOB: 1935/05/11 Today's Date: 09/08/2024   History of Present Illness   Pt is an 89 year old female admitted with Acute closed L4 vertebral fracture    PMH significant for HTN, hypothyroidism, HLD, Sjogren's syndrome, SLE, depression, GERD, recent hospitalization from 08/28/2024 through 08/30/2024 for influenza A pneumonia and possibly bacterial pneumonia, chronic compression fracture of L1 s/p kyphoplasty     Clinical Impressions Chart reviewed to date, pt greeted semi supine in bed, agreeable to OT evaluation. Pt was recently discharged from hospital with HHOT. She amb with rollator and is generally MOD I with ADL, has assist with IADL. Pt presents with deficits in strength, endurance, activity tolerance, balance, affecting safe and optimal ADL completion. MOD A required for log roll, STS with MOD A +2 with RW, amb in room with RW with MIN A. Frequent cueing for technique. TLSO donned at edge of bed with MAX A with pt c/o discomfort at chest due to bruising. Adjustments made with pt able to wear it while amb. MOD A required for LB dressing. Pt is performing ADL/functional mobility below PLOF, will benefit from acute OT to address functional deficits and to facilitate optimal ADL/functional mobility performance. Pt is left in bedside chair, all needs met. OT will follow.      If plan is discharge home, recommend the following:   A lot of help with walking and/or transfers;A lot of help with bathing/dressing/bathroom;Help with stairs or ramp for entrance;Assistance with cooking/housework     Functional Status Assessment   Patient has had a recent decline in their functional status and demonstrates the ability to make significant improvements in function in a reasonable and predictable amount of time.     Equipment Recommendations   Other (comment) (defer to next venue of care)     Recommendations  for Other Services         Precautions/Restrictions   Precautions Precautions: Fall Recall of Precautions/Restrictions: Intact Required Braces or Orthoses: Spinal Brace Spinal Brace: Thoracolumbosacral orthotic;Other (comment) Spinal Brace Comments: for comfort Restrictions Weight Bearing Restrictions Per Provider Order: No     Mobility Bed Mobility Overal bed mobility: Needs Assistance Bed Mobility: Rolling, Sidelying to Sit Rolling: Mod assist, Used rails Sidelying to sit: Mod assist, HOB elevated       General bed mobility comments: frequent cueing for technique, increased time due to L shoudler pain    Transfers Overall transfer level: Needs assistance Equipment used: Rolling walker (2 wheels) Transfers: Sit to/from Stand Sit to Stand: Mod assist, +2 physical assistance (from regular height hospital bed)           General transfer comment: frequent vcs for technique      Balance Overall balance assessment: Needs assistance Sitting-balance support: Feet supported Sitting balance-Leahy Scale: Fair   Postural control: Posterior lean Standing balance support: Bilateral upper extremity supported, During functional activity, Reliant on assistive device for balance Standing balance-Leahy Scale: Poor                             ADL either performed or assessed with clinical judgement   ADL Overall ADL's : Needs assistance/impaired                 Upper Body Dressing : Maximal assistance;Sitting Upper Body Dressing Details (indicate cue type and reason): donn/doff TLSO, education provided to daugther/pt re: fit, function, use Lower Body Dressing: Moderate  assistance Lower Body Dressing Details (indicate cue type and reason): donning shoes sitting on edge of bed Toilet Transfer: Minimal assistance;Ambulation;Rolling walker (2 wheels);Cueing for sequencing Toilet Transfer Details (indicate cue type and reason): simulated to bedside  chair Toileting- Clothing Manipulation and Hygiene: Maximal assistance       Functional mobility during ADLs: Minimal assistance;Rolling walker (2 wheels);Cueing for safety;Cueing for sequencing (approx 15' in room)       Vision         Perception         Praxis         Pertinent Vitals/Pain Pain Assessment Pain Assessment: 0-10 Pain Score: 8  Pain Location: L shoulder Pain Descriptors / Indicators: Discomfort, Grimacing Pain Intervention(s): Limited activity within patient's tolerance, Monitored during session, Premedicated before session, Repositioned     Extremity/Trunk Assessment Upper Extremity Assessment Upper Extremity Assessment: Generalized weakness   Lower Extremity Assessment Lower Extremity Assessment: Defer to PT evaluation   Cervical / Trunk Assessment Cervical / Trunk Assessment: Kyphotic   Communication Communication Communication: Impaired Factors Affecting Communication: Hearing impaired   Cognition Arousal: Alert Behavior During Therapy: WFL for tasks assessed/performed Cognition: No apparent impairments                               Following commands: Intact       Cueing  General Comments   Cueing Techniques: Verbal cues  vss throughout, TLSO doffed in chair post session   Exercises Other Exercises Other Exercises: edu pt/daugther Randine re role of OT, role of rehab, discharge recommendations, donning/fit of TLSO   Shoulder Instructions      Home Living Family/patient expects to be discharged to:: Private residence Living Arrangements: Children Available Help at Discharge: Family;Available PRN/intermittently (pt is home for a few hrs alone, can get 24/7 per daughter) Type of Home: House Home Access: Level entry     Home Layout: Two level;Able to live on main level with bedroom/bathroom     Bathroom Shower/Tub: Producer, Television/film/video: Standard     Home Equipment: Patent Examiner (4  wheels);BSC/3in1;Grab bars - tub/shower;Rolling Environmental Consultant (2 wheels)          Prior Functioning/Environment Prior Level of Function : Needs assist             Mobility Comments: PRN use of furniture walking vs rollator, 1 fall the night prior to admission ADLs Comments: generally MOD I with feeding,grooming, dressing, bathing, toileting (sits on bsc in the shower); PRN assist from daugther, assist for IADLs    OT Problem List: Decreased activity tolerance;Decreased strength;Impaired balance (sitting and/or standing);Decreased knowledge of use of DME or AE   OT Treatment/Interventions: Self-care/ADL training;Therapeutic exercise;Patient/family education;Balance training;Therapeutic activities;Energy conservation;DME and/or AE instruction      OT Goals(Current goals can be found in the care plan section)   Acute Rehab OT Goals Patient Stated Goal: rehab OT Goal Formulation: With patient/family Time For Goal Achievement: 09/22/24 Potential to Achieve Goals: Good ADL Goals Pt Will Perform Grooming: with modified independence;sitting;standing Pt Will Perform Lower Body Dressing: with modified independence;sitting/lateral leans;sit to/from stand Pt Will Transfer to Toilet: with modified independence;ambulating Pt Will Perform Toileting - Clothing Manipulation and hygiene: with modified independence;sitting/lateral leans;sit to/from stand   OT Frequency:  Min 2X/week    Co-evaluation              AM-PAC OT 6 Clicks Daily Activity     Outcome Measure  Help from another person eating meals?: A Little Help from another person taking care of personal grooming?: A Little Help from another person toileting, which includes using toliet, bedpan, or urinal?: A Lot Help from another person bathing (including washing, rinsing, drying)?: A Lot Help from another person to put on and taking off regular upper body clothing?: A Little Help from another person to put on and taking off  regular lower body clothing?: A Lot 6 Click Score: 15   End of Session Equipment Utilized During Treatment: Rolling walker (2 wheels) Nurse Communication: Mobility status  Activity Tolerance: Patient tolerated treatment well Patient left: in chair;with call bell/phone within reach;with chair alarm set;with family/visitor present;with nursing/sitter in room  OT Visit Diagnosis: Other abnormalities of gait and mobility (R26.89);Muscle weakness (generalized) (M62.81)                Time: 9061-8998 OT Time Calculation (min): 23 min Charges:  OT General Charges $OT Visit: 1 Visit OT Evaluation $OT Eval Moderate Complexity: 1 Mod Therisa Sheffield, OTD OTR/L  09/08/2024, 10:20 AM

## 2024-09-09 DIAGNOSIS — S32049A Unspecified fracture of fourth lumbar vertebra, initial encounter for closed fracture: Secondary | ICD-10-CM | POA: Diagnosis not present

## 2024-09-09 NOTE — Plan of Care (Signed)
  Problem: Education: Goal: Knowledge of General Education information will improve Description: Including pain rating scale, medication(s)/side effects and non-pharmacologic comfort measures Outcome: Progressing   Problem: Clinical Measurements: Goal: Ability to maintain clinical measurements within normal limits will improve Outcome: Progressing   Problem: Activity: Goal: Risk for activity intolerance will decrease Outcome: Progressing   Problem: Nutrition: Goal: Adequate nutrition will be maintained Outcome: Progressing   Problem: Coping: Goal: Level of anxiety will decrease Outcome: Progressing   Problem: Elimination: Goal: Will not experience complications related to bowel motility Outcome: Progressing   Problem: Pain Managment: Goal: General experience of comfort will improve and/or be controlled Outcome: Progressing   Problem: Safety: Goal: Ability to remain free from injury will improve Outcome: Progressing   Problem: Skin Integrity: Goal: Risk for impaired skin integrity will decrease Outcome: Progressing

## 2024-09-09 NOTE — Progress Notes (Addendum)
 "    Progress Note    Kim Santiago  FMW:[id number] DOB: 12-Sep-1934  DOA: 09/06/2024 PCP: Marylynn Verneita CROME, MD      Brief Narrative:    Medical records reviewed and are as summarized below:  Kim Santiago is a 89 y.o. female with medical history significant for HTN, hypothyroidism, HLD, Sjogren's syndrome, SLE, depression, GERD, recent hospitalization from 08/28/2024 through 08/30/2024 for influenza A pneumonia and possibly bacterial pneumonia.  She had a fall on 08/27/2024 according to her daughter when she slid out of her bed onto the ground.  She developed low back pain as a result of the fall.  However, at that time, she was having significant respiratory symptoms so she did not pay much attention to her pain.  After recent discharge from the hospital, patient began to complain of significant low back pain.  Initially, she was able to ambulate with a walker.  However, pain got worse to the point that she could no longer ambulate.  Pain is sharp and severe and nonradiating.  No known relieving factors and is worse with little movement. She still has a cough from recent lung infection.  She is able to move her lower extremities.  No fever, chills, shortness of breath, chest pain, vomiting, GI or urinary symptoms.     ED Course: Vital signs temperature 99.3 F, respiratory 18, pulse 72, BP 154/73, oxygen  saturation 100% on room air.     MRI lumbar spine IMPRESSION: 1. Acute compression fracture of L4 with approximately 10% height loss and associated edema. No significant retropulsion. 2. Moderate to severe spinal canal stenosis at L4-L5 with impingement of the traversing L5 nerve roots. 3. Additional moderate spinal canal stenosis at L3-4 with impingement of the traversing L4 nerve roots. 4. Chronic compression fracture of L1 status post kyphoplasty.       The patient was given IV fentanyl , IV morphine  and IV Zofran  in the ED.       Assessment/Plan:   Principal  Problem:   Closed L4 vertebral fracture (HCC)    Body mass index is 23.03 kg/m.   Acute closed L4 vertebral fracture, most severe acute on chronic low back pain, s/p fall at home: Use Tylenol  and oxycodone  as needed for pain.  Minimize use of morphine  as able.  Continue TLSO brace for comfort.  Seen OT recommended discharge to SNF.  Follow-up with TOC to assist with disposition.     Cough from recent influenza A pneumonia: Robitussin as needed for cough. Positive influenza A test on 08/28/2024     Drug-induced SLE: Continue low-dose prednisone .  Follow     Chronic diastolic CHF: Compensated   Mild hyponatremia: Asymptomatic.   Mild low-grade fever with temperature of 100.5 F in the early hours of the morning on 09/09/2023: Fever appears to have resolved. Pyuria: No urinary symptoms.   Thrombocytopenia: Improved     Comorbidities include chronic anemia, hypertension, depression, chronic moderate to severe lumbar spinal stenosis, chronic compression fracture of L1 s/p kyphoplasty   Plan discussed with Kim Santiago, daughter, at the bedside.   Diet Order             Diet Heart Fluid consistency: Thin  Diet effective now                                  Consultants: Neurosurgeon  Procedures: None    Medications:    atorvastatin   20  mg Oral QHS   enoxaparin  (LOVENOX ) injection  40 mg Subcutaneous Q24H   folic acid   3 mg Oral Daily   levothyroxine   75 mcg Oral Q0600   metoprolol  succinate  25 mg Oral QHS   oxyCODONE -acetaminophen   1 tablet Oral Once   polyethylene glycol  17 g Oral Daily   predniSONE   2.5 mg Oral Daily   sertraline   25 mg Oral QHS   Continuous Infusions:   Anti-infectives (From admission, onward)    None              Family Communication/Anticipated D/C date and plan/Code Status   DVT prophylaxis: enoxaparin  (LOVENOX ) injection 40 mg Start: 09/07/24 1000     Code Status: Limited: Do not attempt  resuscitation (DNR) -DNR-LIMITED -Do Not Intubate/DNI   Family Communication: Plan discussed with Kim Santiago, daughter, at the bedside Disposition Plan: Plan to discharge to SNF   Status is: Observation The patient will require care spanning > 2 midnights and should be moved to inpatient because: Pain control for L4 vertebral fracture, awaiting placement to SNF       Subjective:   Interval events noted.  No new complaints.  Back pain is better.  Left neck/left shoulder pain is also better.  Kim Santiago, daughter, was at the bedside.  Kim Santiago, a family friend, was also at the bedside.  Objective:    Vitals:   09/08/24 2111 09/09/24 0458 09/09/24 0732 09/09/24 1506  BP: 118/69 114/64 (!) 116/56 (!) 116/56  Pulse: 85 94 88 80  Resp: 18 17 16 16   Temp: 98.5 F (36.9 C) 99 F (37.2 C) 97.7 F (36.5 C) 98.5 F (36.9 C)  TempSrc:    Oral  SpO2: 99% 97% 96% 98%  Weight:      Height:       No data found.   Intake/Output Summary (Last 24 hours) at 09/09/2024 1518 Last data filed at 09/09/2024 1012 Gross per 24 hour  Intake 360 ml  Output --  Net 360 ml   Filed Weights   09/06/24 0610  Weight: 59 kg    Exam:  GEN: NAD, sitting up in the chair SKIN: Warm and dry EYES: EOMI ENT: MMM CV: RRR PULM: CTA B ABD: soft, ND, NT, +BS CNS: AAO x 3, non focal EXT: No edema or tenderness     Data Reviewed:   I have personally reviewed following labs and imaging studies:  Labs: Labs show the following:   Basic Metabolic Panel: Recent Labs  Lab 09/06/24 0626  NA 134*  K 4.2  CL 99  CO2 27  GLUCOSE 93  BUN 11  CREATININE 0.71  CALCIUM  9.0   GFR Estimated Creatinine Clearance: 41 mL/min (by C-G formula based on SCr of 0.71 mg/dL). Liver Function Tests: No results for input(s): AST, ALT, ALKPHOS, BILITOT, PROT, ALBUMIN in the last 168 hours. No results for input(s): LIPASE, AMYLASE in the last 168 hours. No results for input(s): AMMONIA in the last  168 hours. Coagulation profile No results for input(s): INR, PROTIME in the last 168 hours.  CBC: Recent Labs  Lab 09/06/24 0626  WBC 3.5*  NEUTROABS 1.5*  HGB 8.8*  HCT 25.9*  MCV 103.6*  PLT 195   Cardiac Enzymes: No results for input(s): CKTOTAL, CKMB, CKMBINDEX, TROPONINI in the last 168 hours. BNP (last 3 results) No results for input(s): PROBNP in the last 8760 hours. CBG: No results for input(s): GLUCAP in the last 168 hours. D-Dimer: No results for input(s): DDIMER  in the last 72 hours. Hgb A1c: No results for input(s): HGBA1C in the last 72 hours. Lipid Profile: No results for input(s): CHOL, HDL, LDLCALC, TRIG, CHOLHDL, LDLDIRECT in the last 72 hours. Thyroid  function studies: No results for input(s): TSH, T4TOTAL, T3FREE, THYROIDAB in the last 72 hours.  Invalid input(s): FREET3 Anemia work up: No results for input(s): VITAMINB12, FOLATE, FERRITIN, TIBC, IRON , RETICCTPCT in the last 72 hours. Sepsis Labs: Recent Labs  Lab 09/06/24 0626  WBC 3.5*    Microbiology Recent Results (from the past 240 hours)  Urine Culture     Status: Abnormal   Collection Time: 09/06/24  6:21 AM   Specimen: Urine, Clean Catch  Result Value Ref Range Status   Specimen Description   Final    URINE, CLEAN CATCH Performed at [facility], [address], [place], [place] [address]    Special Requests   Final    NONE Performed at [facility], [address] [place], [place] [address]    Culture MULTIPLE SPECIES PRESENT, SUGGEST RECOLLECTION (A)  Final   Report Status 09/07/2024 FINAL  Final    Procedures and diagnostic studies:  No results found.              LOS: 0 days   Hertha Gergen  [facility]   Pager on [email or web address]. If 7PM-7AM, please contact night-coverage at [email or web address]     09/09/2024, 3:18 PM           "

## 2024-09-10 DIAGNOSIS — S32049A Unspecified fracture of fourth lumbar vertebra, initial encounter for closed fracture: Secondary | ICD-10-CM | POA: Diagnosis not present

## 2024-09-10 LAB — CBC
HCT: 25.8 % — ABNORMAL LOW (ref 36.0–46.0)
Hemoglobin: 8.8 g/dL — ABNORMAL LOW (ref 12.0–15.0)
MCH: 34.9 pg — ABNORMAL HIGH (ref 26.0–34.0)
MCHC: 34.1 g/dL (ref 30.0–36.0)
MCV: 102.4 fL — ABNORMAL HIGH (ref 80.0–100.0)
Platelets: 187 K/uL (ref 150–400)
RBC: 2.52 MIL/uL — ABNORMAL LOW (ref 3.87–5.11)
RDW: 17.4 % — ABNORMAL HIGH (ref 11.5–15.5)
WBC: 2.5 K/uL — ABNORMAL LOW (ref 4.0–10.5)
nRBC: 0 % (ref 0.0–0.2)

## 2024-09-10 LAB — BASIC METABOLIC PANEL WITH GFR
Anion gap: 7 (ref 5–15)
BUN: 14 mg/dL (ref 8–23)
CO2: 28 mmol/L (ref 22–32)
Calcium: 8.6 mg/dL — ABNORMAL LOW (ref 8.9–10.3)
Chloride: 96 mmol/L — ABNORMAL LOW (ref 98–111)
Creatinine, Ser: 0.63 mg/dL (ref 0.44–1.00)
GFR, Estimated: >60 mL/min
Glucose, Bld: 92 mg/dL (ref 70–99)
Potassium: 4.3 mmol/L (ref 3.5–5.1)
Sodium: 131 mmol/L — ABNORMAL LOW (ref 135–145)

## 2024-09-10 NOTE — Progress Notes (Signed)
 Mobility Specialist Progress Note:    09/10/24 1206  Mobility  Activity Ambulated with assistance;Pivoted/transferred from bed to chair  Level of Assistance Moderate assist, patient does 50-74%  Assistive Device Front wheel walker  Distance Ambulated (ft) 4 ft  Range of Motion/Exercises Active;All extremities  Activity Response Tolerated well  Mobility visit 1 Mobility  Mobility Specialist Start Time (ACUTE ONLY) 1159  Mobility Specialist Stop Time (ACUTE ONLY) 1207  Mobility Specialist Time Calculation (min) (ACUTE ONLY) 8 min   RN requesting assistance to transfer pt, TLSO placed prior to mobility. Required ModA +2 to stand and MinA +2 to transfer with RW. Tolerated well, c/o left foot pain. Left in chair, RN at bedside. All needs met.  Sherrilee Ditty Mobility Specialist Please contact via Special Educational Needs Teacher or  Rehab office at [phone]

## 2024-09-10 NOTE — Progress Notes (Signed)
 "    Progress Note    Kim Santiago  FMW:[id number] DOB: 1935/05/09  DOA: 09/06/2024 PCP: Marylynn Verneita CROME, MD      Brief Narrative:    Medical records reviewed and are as summarized below:  Kim Santiago is a 89 y.o. female with medical history significant for HTN, hypothyroidism, HLD, Sjogren's syndrome, SLE, depression, GERD, recent hospitalization from 08/28/2024 through 08/30/2024 for influenza A pneumonia and possibly bacterial pneumonia.  She had a fall on 08/27/2024 according to her daughter when she slid out of her bed onto the ground.  She developed low back pain as a result of the fall.  However, at that time, she was having significant respiratory symptoms so she did not pay much attention to her pain.  After recent discharge from the hospital, patient began to complain of significant low back pain.  Initially, she was able to ambulate with a walker.  However, pain got worse to the point that she could no longer ambulate.  Pain is sharp and severe and nonradiating.  No known relieving factors and is worse with little movement. She still has a cough from recent lung infection.  She is able to move her lower extremities.  No fever, chills, shortness of breath, chest pain, vomiting, GI or urinary symptoms.     ED Course: Vital signs temperature 99.3 F, respiratory 18, pulse 72, BP 154/73, oxygen  saturation 100% on room air.     MRI lumbar spine IMPRESSION: 1. Acute compression fracture of L4 with approximately 10% height loss and associated edema. No significant retropulsion. 2. Moderate to severe spinal canal stenosis at L4-L5 with impingement of the traversing L5 nerve roots. 3. Additional moderate spinal canal stenosis at L3-4 with impingement of the traversing L4 nerve roots. 4. Chronic compression fracture of L1 status post kyphoplasty.       The patient was given IV fentanyl , IV morphine  and IV Zofran  in the ED.       Assessment/Plan:   Principal  Problem:   Closed L4 vertebral fracture (HCC)    Body mass index is 23.03 kg/m.   Acute closed L4 vertebral fracture, most severe acute on chronic low back pain, s/p fall at home, left foot pain probably related to recent fall at home: Use Tylenol  and oxycodone  as needed for pain.  Minimize use of morphine  as able.  Continue TLSO brace for comfort.  PT and OT recommended discharge to SNF.   Follow-up with TOC to assist with disposition.     Cough from recent influenza A pneumonia: Improved.  Robitussin as needed for cough. Positive influenza A test on 08/28/2024     Drug-induced SLE: Continue low-dose prednisone .  Follow     Chronic diastolic CHF: Compensated   Mild hyponatremia: Asymptomatic.  Chart review shows she has had chronic hyponatremia.  Patient and daughter acknowledged history of chronic hyponatremia.   Mild low-grade fever with temperature of 100.5 F in the early hours of the morning on 09/09/2023: Fever appears to have resolved. Pyuria: No urinary symptoms.   Thrombocytopenia: Improved Chronic intermittent leukopenia.  Patient and daughter acknowledged history of leukopenia.    Comorbidities include chronic anemia, hypertension, depression, chronic moderate to severe lumbar spinal stenosis, chronic compression fracture of L1 s/p kyphoplasty   Plan discussed with Bari, daughter, at the bedside.   Diet Order             Diet Heart Fluid consistency: Thin  Diet effective now  Consultants: Neurosurgeon  Procedures: None    Medications:    atorvastatin   20 mg Oral QHS   enoxaparin  (LOVENOX ) injection  40 mg Subcutaneous Q24H   folic acid   3 mg Oral Daily   levothyroxine   75 mcg Oral Q0600   metoprolol  succinate  25 mg Oral QHS   oxyCODONE -acetaminophen   1 tablet Oral Once   polyethylene glycol  17 g Oral Daily   predniSONE   2.5 mg Oral Daily   sertraline   25 mg Oral QHS   Continuous  Infusions:   Anti-infectives (From admission, onward)    None              Family Communication/Anticipated D/C date and plan/Code Status   DVT prophylaxis: enoxaparin  (LOVENOX ) injection 40 mg Start: 09/07/24 1000     Code Status: Limited: Do not attempt resuscitation (DNR) -DNR-LIMITED -Do Not Intubate/DNI   Family Communication: Plan discussed with Christy, daughter, at the bedside Disposition Plan: Plan to discharge to SNF   Status is: Observation The patient will require care spanning > 2 midnights and should be moved to inpatient because: Pain control for L4 vertebral fracture, awaiting placement to SNF       Subjective:   She complaints of pain in the left foot, mainly on the dorsal aspect of the left forefoot.  She thinks it started yesterday.  She still has some low back pain.  Bari, daughter, at the bedside  Objective:    Vitals:   09/09/24 1506 09/09/24 1954 09/10/24 0322 09/10/24 0734  BP: (!) 116/56 (!) 114/56 (!) 122/52 (!) 113/56  Pulse: 80 79 72 73  Resp: 16 18 16 17   Temp: 98.5 F (36.9 C) 98.2 F (36.8 C) 98.8 F (37.1 C) 98.2 F (36.8 C)  TempSrc: Oral   Oral  SpO2: 98% 99% 99% 96%  Weight:      Height:       No data found.  No intake or output data in the 24 hours ending 09/10/24 1437  Filed Weights   09/06/24 0610  Weight: 59 kg    Exam:  GEN: NAD SKIN: Warm and dry EYES: No pallor or icterus ENT: MMM CV: RRR PULM: CTA B ABD: soft, ND, NT, +BS CNS: AAO x 3, non focal EXT: No edema, erythema.  Mild tenderness on the dorsal aspect of the left forefoot.      Data Reviewed:   I have personally reviewed following labs and imaging studies:  Labs: Labs show the following:   Basic Metabolic Panel: Recent Labs  Lab 09/06/24 0626 09/10/24 0448  NA 134* 131*  K 4.2 4.3  CL 99 96*  CO2 27 28  GLUCOSE 93 92  BUN 11 14  CREATININE 0.71 0.63  CALCIUM  9.0 8.6*   GFR Estimated Creatinine Clearance: 41  mL/min (by C-G formula based on SCr of 0.63 mg/dL). Liver Function Tests: No results for input(s): AST, ALT, ALKPHOS, BILITOT, PROT, ALBUMIN in the last 168 hours. No results for input(s): LIPASE, AMYLASE in the last 168 hours. No results for input(s): AMMONIA in the last 168 hours. Coagulation profile No results for input(s): INR, PROTIME in the last 168 hours.  CBC: Recent Labs  Lab 09/06/24 0626 09/10/24 0448  WBC 3.5* 2.5*  NEUTROABS 1.5*  --   HGB 8.8* 8.8*  HCT 25.9* 25.8*  MCV 103.6* 102.4*  PLT 195 187   Cardiac Enzymes: No results for input(s): CKTOTAL, CKMB, CKMBINDEX, TROPONINI in the last 168 hours. BNP (last 3 results)  No results for input(s): PROBNP in the last 8760 hours. CBG: No results for input(s): GLUCAP in the last 168 hours. D-Dimer: No results for input(s): DDIMER in the last 72 hours. Hgb A1c: No results for input(s): HGBA1C in the last 72 hours. Lipid Profile: No results for input(s): CHOL, HDL, LDLCALC, TRIG, CHOLHDL, LDLDIRECT in the last 72 hours. Thyroid  function studies: No results for input(s): TSH, T4TOTAL, T3FREE, THYROIDAB in the last 72 hours.  Invalid input(s): FREET3 Anemia work up: No results for input(s): VITAMINB12, FOLATE, FERRITIN, TIBC, IRON , RETICCTPCT in the last 72 hours. Sepsis Labs: Recent Labs  Lab 09/06/24 0626 09/10/24 0448  WBC 3.5* 2.5*    Microbiology Recent Results (from the past 240 hours)  Urine Culture     Status: Abnormal   Collection Time: 09/06/24  6:21 AM   Specimen: Urine, Clean Catch  Result Value Ref Range Status   Specimen Description   Final    URINE, CLEAN CATCH Performed at [facility], [address], [place], [place] [address]    Special Requests   Final    NONE Performed at [facility], [address] [place], [place] [address]    Culture MULTIPLE SPECIES PRESENT, SUGGEST RECOLLECTION (A)   Final   Report Status 09/07/2024 FINAL  Final    Procedures and diagnostic studies:  No results found.              LOS: 0 days   Genie Wenke  [facility]   Pager on [email or web address]. If 7PM-7AM, please contact night-coverage at [email or web address]     09/10/2024, 2:37 PM           "

## 2024-09-11 ENCOUNTER — Observation Stay

## 2024-09-11 DIAGNOSIS — S32049A Unspecified fracture of fourth lumbar vertebra, initial encounter for closed fracture: Secondary | ICD-10-CM | POA: Diagnosis not present

## 2024-09-11 NOTE — Progress Notes (Addendum)
 "    Progress Note    KINZIE Santiago  FMW:[id number] DOB: 24-Apr-1935  DOA: 09/06/2024 PCP: Kim Verneita CROME, MD      Brief Narrative:    Medical records reviewed and are as summarized below:  Kim Santiago is a 89 y.o. female with medical history significant for HTN, hypothyroidism, HLD, Sjogren's syndrome, SLE, depression, GERD, recent hospitalization from 08/28/2024 through 08/30/2024 for influenza A pneumonia and possibly bacterial pneumonia.  She had a fall on 08/27/2024 according to her daughter when she slid out of her bed onto the ground.  She developed low back pain as a result of the fall.  However, at that time, she was having significant respiratory symptoms so she did not pay much attention to her pain.  After recent discharge from the hospital, patient began to complain of significant low back pain.  Initially, she was able to ambulate with a walker.  However, pain got worse to the point that she could no longer ambulate.  Pain is sharp and severe and nonradiating.  No known relieving factors and is worse with little movement. She still has a cough from recent lung infection.  She is able to move her lower extremities.  No fever, chills, shortness of breath, chest pain, vomiting, GI or urinary symptoms.     ED Course: Vital signs temperature 99.3 F, respiratory 18, pulse 72, BP 154/73, oxygen  saturation 100% on room air.     MRI lumbar spine IMPRESSION: 1. Acute compression fracture of L4 with approximately 10% height loss and associated edema. No significant retropulsion. 2. Moderate to severe spinal canal stenosis at L4-L5 with impingement of the traversing L5 nerve roots. 3. Additional moderate spinal canal stenosis at L3-4 with impingement of the traversing L4 nerve roots. 4. Chronic compression fracture of L1 status post kyphoplasty.       The patient was given IV fentanyl , IV morphine  and IV Zofran  in the ED.       Assessment/Plan:   Principal  Problem:   Closed L4 vertebral fracture (HCC)    Body mass index is 23.03 kg/m.   Acute closed L4 vertebral fracture, most severe acute on chronic low back pain, s/p fall at home, left foot pain probably related to recent fall at home: Use Tylenol  and oxycodone  as needed for pain.  Minimize use of morphine  as able.  Case discussed with Dr. Claudene, neurosurgeon, on 09/11/2024.  He recommended discontinuing TLSO brace order. PT and OT recommended discharge to SNF.   Follow-up with TOC to assist with disposition.   Left foot/ankle pain and swelling: X-ray of the left ankle showed osteopenia but no acute fracture or dislocation was noted.  CT scan of left ankle has been ordered for further evaluation.  Analgesics as needed for pain.  Check uric acid level. Of note, patient has been in the hospital since 09/06/2024.  She did not have left foot pain on admission and only reported left foot pain from 09/10/2024.  Swelling was noticed on 09/12/2024.  Unclear whether she has developed possible stress fracture/spontaneous fracture in the left foot.  CT scan has been ordered for further evaluation.   Cough from recent influenza A pneumonia: Improved.  Robitussin as needed for cough. Positive influenza A test on 08/28/2024     Drug-induced SLE: Continue low-dose prednisone .       Chronic diastolic CHF: Compensated   Mild hyponatremia: Asymptomatic.  Chart review shows she has had chronic hyponatremia.  Patient and daughter acknowledged history of  chronic hyponatremia.   Mild low-grade fever with temperature of 100.5 F in the early hours of the morning on 09/09/2023: Fever appears to have resolved. Pyuria: No urinary symptoms.   Thrombocytopenia: Improved Chronic intermittent leukopenia.  Patient and daughter acknowledged history of leukopenia.    Comorbidities include chronic anemia, hypertension, depression, chronic moderate to severe lumbar spinal stenosis, chronic compression fracture of L1 s/p  kyphoplasty   Plan discussed with Bari, daughter, at the bedside.   Diet Order             Diet Heart Fluid consistency: Thin  Diet effective now                                  Consultants: Neurosurgeon  Procedures: None    Medications:    atorvastatin   20 mg Oral QHS   enoxaparin  (LOVENOX ) injection  40 mg Subcutaneous Q24H   folic acid   3 mg Oral Daily   levothyroxine   75 mcg Oral Q0600   metoprolol  succinate  25 mg Oral QHS   oxyCODONE -acetaminophen   1 tablet Oral Once   polyethylene glycol  17 g Oral Daily   predniSONE   2.5 mg Oral Daily   sertraline   25 mg Oral QHS   Continuous Infusions:   Anti-infectives (From admission, onward)    None              Family Communication/Anticipated D/C date and plan/Code Status   DVT prophylaxis: enoxaparin  (LOVENOX ) injection 40 mg Start: 09/07/24 1000     Code Status: Limited: Do not attempt resuscitation (DNR) -DNR-LIMITED -Do Not Intubate/DNI   Family Communication: Plan discussed with Kim Santiago, daughter, at the bedside Disposition Plan: Plan to discharge to SNF   Status is: Observation The patient will require care spanning > 2 midnights and should be moved to inpatient because: Pain control for L4 vertebral fracture, awaiting placement to SNF       Subjective:   Interval events noted.  She complains of persistent pain in the left foot.  Her left ankle has become swollen.  Left ankle was not swollen yesterday.  This was confirmed by Bari, daughter at the bedside.  Objective:    Vitals:   09/10/24 2101 09/11/24 0438 09/11/24 0807 09/11/24 1547  BP: 122/61 (!) 120/59 (!) 105/57 123/60  Pulse: 72 85 94 93  Resp: 18 18 16 17   Temp: 98.3 F (36.8 C) 98.3 F (36.8 C) 98.9 F (37.2 C) (!) 100.7 F (38.2 C)  TempSrc:      SpO2: 97% 97% 100% 98%  Weight:      Height:       No data found.   Intake/Output Summary (Last 24 hours) at 09/11/2024 1552 Last data filed  at 09/11/2024 0900 Gross per 24 hour  Intake 240 ml  Output 800 ml  Net -560 ml    Filed Weights   09/06/24 0610  Weight: 59 kg    Exam:  GEN: NAD SKIN: Warm and dry EYES: No pallor or icterus ENT: MMM CV: RRR PULM: CTA B ABD: soft, ND, NT, +BS CNS: AAO x 3, non focal EXT: She has developed swelling about the left ankle more prominent on the lateral malleolus.  Tenderness in the left ankle joint and proximal aspect of dorsal foot.    Data Reviewed:   I have personally reviewed following labs and imaging studies:  Labs: Labs show the following:   Basic Metabolic  Panel: Recent Labs  Lab 09/06/24 0626 09/10/24 0448  NA 134* 131*  K 4.2 4.3  CL 99 96*  CO2 27 28  GLUCOSE 93 92  BUN 11 14  CREATININE 0.71 0.63  CALCIUM  9.0 8.6*   GFR Estimated Creatinine Clearance: 41 mL/min (by C-G formula based on SCr of 0.63 mg/dL). Liver Function Tests: No results for input(s): AST, ALT, ALKPHOS, BILITOT, PROT, ALBUMIN in the last 168 hours. No results for input(s): LIPASE, AMYLASE in the last 168 hours. No results for input(s): AMMONIA in the last 168 hours. Coagulation profile No results for input(s): INR, PROTIME in the last 168 hours.  CBC: Recent Labs  Lab 09/06/24 0626 09/10/24 0448  WBC 3.5* 2.5*  NEUTROABS 1.5*  --   HGB 8.8* 8.8*  HCT 25.9* 25.8*  MCV 103.6* 102.4*  PLT 195 187   Cardiac Enzymes: No results for input(s): CKTOTAL, CKMB, CKMBINDEX, TROPONINI in the last 168 hours. BNP (last 3 results) No results for input(s): PROBNP in the last 8760 hours. CBG: No results for input(s): GLUCAP in the last 168 hours. D-Dimer: No results for input(s): DDIMER in the last 72 hours. Hgb A1c: No results for input(s): HGBA1C in the last 72 hours. Lipid Profile: No results for input(s): CHOL, HDL, LDLCALC, TRIG, CHOLHDL, LDLDIRECT in the last 72 hours. Thyroid  function studies: No results for input(s): TSH,  T4TOTAL, T3FREE, THYROIDAB in the last 72 hours.  Invalid input(s): FREET3 Anemia work up: No results for input(s): VITAMINB12, FOLATE, FERRITIN, TIBC, IRON , RETICCTPCT in the last 72 hours. Sepsis Labs: Recent Labs  Lab 09/06/24 0626 09/10/24 0448  WBC 3.5* 2.5*    Microbiology Recent Results (from the past 240 hours)  Urine Culture     Status: Abnormal   Collection Time: 09/06/24  6:21 AM   Specimen: Urine, Clean Catch  Result Value Ref Range Status   Specimen Description   Final    URINE, CLEAN CATCH Performed at [facility], [address], [place], [place] [address]    Special Requests   Final    NONE Performed at [facility], [address] [place], [place] [address]    Culture MULTIPLE SPECIES PRESENT, SUGGEST RECOLLECTION (A)  Final   Report Status 09/07/2024 FINAL  Final    Procedures and diagnostic studies:  DG Ankle 2 Views Left Result Date: 09/11/2024 EXAM: 2 VIEW(S) XRAY OF THE LEFT ANKLE 09/11/2024 11:04:00 AM CLINICAL HISTORY: Acute left ankle pain COMPARISON: None available. FINDINGS: BONES AND JOINTS: Achilles insertion enthesophyte with small undersurface calcaneal heel spur. Diffuse osteopenia. No acute fracture or dislocation. No malalignment. SOFT TISSUES: Artifact from overlying sock results in suboptimal evaluation of fine bony detail. Peripheral vascular atherosclerosis. IMPRESSION: 1. Osteopenia. Overlying sock artifact results in suboptimal evaluation of fine bony detail. Otherwise, no acute, displaced fracture or dislocation. Electronically signed by: Rogelia Myers MD 09/11/2024 02:34 PM EST RP Workstation: [id number]                LOS: 0 days   Baby Gieger  [facility]   Pager on [email or web address]. If 7PM-7AM, please contact night-coverage at [email or web address]     09/11/2024, 3:52 PM           "

## 2024-09-11 NOTE — Plan of Care (Signed)

## 2024-09-11 NOTE — TOC Progression Note (Signed)
 Transition of Care [facility]) - Progression Note    Patient Details  Name: Kim Santiago MRN: [id number] Date of Birth: Jun 22, 1935  Transition of [facility] CM/SW Contact  Alvaro Louder, [place] Phone Number: 09/11/2024, 3:29 PM  Clinical Narrative:  Insurance auth back. # [id number]. Patient not medically ready once medically ready patient can admit to SNF [facility].   TOC to follow for discharge                      Expected Discharge Plan and Services                                               Social Drivers of Health (SDOH) Interventions SDOH Screenings   Food Insecurity: No Food Insecurity (09/06/2024)  Housing: Unknown (09/06/2024)  Transportation Needs: Unknown (09/06/2024)  Utilities: Not At Risk (09/06/2024)  Alcohol Screen: Low Risk (04/18/2024)  Depression (PHQ2-9): High Risk (09/01/2024)  Financial Resource Strain: Low Risk (04/18/2024)  Physical Activity: Insufficiently Active (04/18/2024)  Social Connections: Moderately Isolated (09/06/2024)  Stress: No Stress Concern Present (04/18/2024)  Tobacco Use: Low Risk (09/06/2024)  Health Literacy: Adequate Health Literacy (04/18/2024)    Readmission Risk Interventions     No data to display

## 2024-09-11 NOTE — Progress Notes (Signed)
 Occupational Therapy Treatment Patient Details Name: Kim Santiago MRN: [id number] DOB: [birth year] Today's Date: 09/11/2024   History of present illness Pt is an 89 year old female admitted with Acute closed L4 vertebral fracture    PMH significant for HTN, hypothyroidism, HLD, Sjogren's syndrome, SLE, depression, GERD, recent hospitalization from 08/28/2024 through 08/30/2024 for influenza A pneumonia and possibly bacterial pneumonia, chronic compression fracture of L1 s/p kyphoplasty   OT comments  Patient seen for OT treatment on this date. Upon arrival to room patient resting in bed with daughter present, reporting pain 9/10 in L ankle; nothing noted in chart; OT observed L ankle and noted swelling and pain with palpation and dorsiflexion; OT notified MD/RN/PT. OT repositioned LLE (elevated with heels floating) and educated daughter on importance of positioning.  OT deferred further tx until pain is better controlled. Patient ended tx in bed with RN and daughter present.      If plan is discharge home, recommend the following:  A lot of help with walking and/or transfers;A lot of help with bathing/dressing/bathroom;Help with stairs or ramp for entrance;Assistance with cooking/housework   Equipment Recommendations  Other (comment)    Recommendations for Other Services      Precautions / Restrictions Precautions Precautions: Fall Recall of Precautions/Restrictions: Intact Required Braces or Orthoses: Spinal Brace Spinal Brace: Thoracolumbosacral orthotic;Other (comment) Spinal Brace Comments: for comfort Restrictions Weight Bearing Restrictions Per Provider Order: No       Mobility Bed Mobility               General bed mobility comments: deferred due to new L ankle pain    Transfers                   General transfer comment: deferred due to new L ankle pain     Balance                                           ADL either performed or  assessed with clinical judgement   ADL                                         General ADL Comments: deferred due to new L ankle pain    Extremity/Trunk Assessment              Vision       Perception     Praxis     Communication     Cognition Arousal: Alert Behavior During Therapy: WFL for tasks assessed/performed Cognition: No apparent impairments                                        Cueing      Exercises      Shoulder Instructions       General Comments      Pertinent Vitals/ Pain       Pain Assessment Pain Assessment: 0-10 Pain Score: 9  Pain Location: L ankle Pain Descriptors / Indicators: Discomfort, Grimacing Pain Intervention(s): Repositioned, Limited activity within patient's tolerance  Home Living  Prior Functioning/Environment              Frequency  Min 2X/week        Progress Toward Goals  OT Goals(current goals can now be found in the care plan section)  Progress towards OT goals: OT to reassess next treatment  Acute Rehab OT Goals Patient Stated Goal: to have my pain improve OT Goal Formulation: With patient/family Time For Goal Achievement: 09/22/24 Potential to Achieve Goals: Good ADL Goals Pt Will Perform Grooming: with modified independence;sitting;standing Pt Will Perform Lower Body Dressing: with modified independence;sitting/lateral leans;sit to/from stand Pt Will Transfer to Toilet: with modified independence;ambulating Pt Will Perform Toileting - Clothing Manipulation and hygiene: with modified independence;sitting/lateral leans;sit to/from stand Additional ADL Goal #1: Pt will demo implementation of 1 learned ECS during ADL performance 2/2 trials to maximize safety/IND and prevent overexertion.  Plan      Co-evaluation                 AM-PAC OT 6 Clicks Daily Activity     Outcome Measure   Help from  another person eating meals?: A Little Help from another person taking care of personal grooming?: A Little Help from another person toileting, which includes using toliet, bedpan, or urinal?: A Lot Help from another person bathing (including washing, rinsing, drying)?: A Lot Help from another person to put on and taking off regular upper body clothing?: A Little Help from another person to put on and taking off regular lower body clothing?: A Lot 6 Click Score: 15    End of Session    OT Visit Diagnosis: Other abnormalities of gait and mobility (R26.89);Muscle weakness (generalized) (M62.81)   Activity Tolerance Patient limited by pain   Patient Left in bed;with call bell/phone within reach;with bed alarm set;with nursing/sitter in room;with family/visitor present   Nurse Communication Other (comment) (new ankle pain)        Time: 9051-9043 OT Time Calculation (min): 8 min  Charges: OT General Charges $OT Visit: 1 Visit OT Treatments $Self Care/Home Management : 8-22 mins  Rogers Clause, OT/L MSOT, 09/11/2024

## 2024-09-11 NOTE — Progress Notes (Signed)
 Physical Therapy Treatment Patient Details Name: Kim Santiago MRN: [id number] DOB: Oct 16, 1934 Today's Date: 09/11/2024   History of Present Illness Pt is an 89 year old female admitted with Acute closed L4 vertebral fracture    PMH significant for HTN, hypothyroidism, HLD, Sjogren's syndrome, SLE, depression, GERD, recent hospitalization from 08/28/2024 through 08/30/2024 for influenza A pneumonia and possibly bacterial pneumonia, chronic compression fracture of L1 s/p kyphoplasty    PT Comments  Treatment session modified to education, repositioning, and application of ice to Left lateral foot. Earlier x-ray only revealing bone spur, MD in to reassess and placed order for CT of L foot. Unable to tolerate mobility or even slight passive ROM to Left foot. Very sensitive to light palpation. Will re-assess in am.    If plan is discharge home, recommend the following: A little help with walking and/or transfers;A little help with bathing/dressing/bathroom;Assist for transportation;Help with stairs or ramp for entrance   Can travel by private vehicle     No  Equipment Recommendations  Other (comment) (Pt non-ambulatory 09/12/23 due to severe L lateral foot pain)    Recommendations for Other Services       Precautions / Restrictions Precautions Precautions: Fall Recall of Precautions/Restrictions: Intact Required Braces or Orthoses: Spinal Brace Spinal Brace: Thoracolumbosacral orthotic;Other (comment) (LSO due to TLSO causing bruising and skin breakdown for L4 fx) Spinal Brace Comments: for comfort Restrictions Weight Bearing Restrictions Per Provider Order: No Other Position/Activity Restrictions:  (Holding L LE wt bearing until after L foot CT)     Mobility  Bed Mobility               General bed mobility comments: deferred due to new L ankle pain    Transfers                   General transfer comment: deferred due to new L ankle pain    Ambulation/Gait                General Gait Details:  (deferred due to new L ankle pain)   Stairs             Wheelchair Mobility     Tilt Bed    Modified Rankin (Stroke Patients Only)       Balance                                            Communication Communication Communication: Impaired Factors Affecting Communication: Hearing impaired  Cognition Arousal: Alert Behavior During Therapy: WFL for tasks assessed/performed   PT - Cognitive impairments: No apparent impairments                         Following commands: Intact      Cueing Cueing Techniques: Verbal cues  Exercises Other Exercises Other Exercises:  (Pt educated on L4 fracture, TLSO in room causing bruising and chest breakdown, will switch to LSO, Neuro and MD in agreement through secure chat 09/11/24)    General Comments General comments (skin integrity, edema, etc.):  (Left lateral ankle edematous, normal in skin color, whole foot warm to touch, very sensative to light palpation)      Pertinent Vitals/Pain Pain Assessment Pain Assessment: Faces Faces Pain Scale: Hurts whole lot Pain Location: L ankle Pain Descriptors / Indicators: Discomfort, Grimacing Pain Intervention(s): Limited activity within patient's  tolerance, Repositioned, Ice applied    Home Living                          Prior Function            PT Goals (current goals can now be found in the care plan section) Acute Rehab PT Goals Patient Stated Goal: to get better Progress towards PT goals: Progressing toward goals    Frequency    Min 2X/week      PT Plan      Co-evaluation              AM-PAC PT 6 Clicks Mobility   Outcome Measure  Help needed turning from your back to your side while in a flat bed without using bedrails?: A Little Help needed moving from lying on your back to sitting on the side of a flat bed without using bedrails?: A Little Help needed moving to and  from a bed to a chair (including a wheelchair)?: A Little Help needed standing up from a chair using your arms (e.g., wheelchair or bedside chair)?: A Little Help needed to walk in hospital room?: A Little Help needed climbing 3-5 steps with a railing? : A Little 6 Click Score: 18    End of Session   Activity Tolerance: Patient limited by pain Patient left: in bed;with call bell/phone within reach;with bed alarm set;with family/visitor present Nurse Communication: Mobility status;Other (comment) (Switch to LSO) PT Visit Diagnosis: Difficulty in walking, not elsewhere classified (R26.2);Other abnormalities of gait and mobility (R26.89);Muscle weakness (generalized) (M62.81)     Time: 1520-1540 PT Time Calculation (min) (ACUTE ONLY): 20 min  Charges:    $Therapeutic Activity: 8-22 mins PT General Charges $$ ACUTE PT VISIT: 1 Visit                    Darice Bohr, PTA  [name] 09/11/2024, 5:50 PM

## 2024-09-12 DIAGNOSIS — S32049A Unspecified fracture of fourth lumbar vertebra, initial encounter for closed fracture: Secondary | ICD-10-CM | POA: Diagnosis not present

## 2024-09-12 LAB — BASIC METABOLIC PANEL WITH GFR
Anion gap: 8 (ref 5–15)
BUN: 13 mg/dL (ref 8–23)
CO2: 28 mmol/L (ref 22–32)
Calcium: 8.5 mg/dL — ABNORMAL LOW (ref 8.9–10.3)
Chloride: 96 mmol/L — ABNORMAL LOW (ref 98–111)
Creatinine, Ser: 0.65 mg/dL (ref 0.44–1.00)
GFR, Estimated: >60 mL/min
Glucose, Bld: 88 mg/dL (ref 70–99)
Potassium: 4.4 mmol/L (ref 3.5–5.1)
Sodium: 132 mmol/L — ABNORMAL LOW (ref 135–145)

## 2024-09-12 LAB — URIC ACID: Uric Acid, Serum: 3.9 mg/dL (ref 2.5–7.1)

## 2024-09-12 MED ORDER — OMEPRAZOLE 20 MG PO CPDR: 20.0000 mg | DELAYED_RELEASE_CAPSULE | Freq: Two times a day (BID) | ORAL | Status: DC | PRN

## 2024-09-12 MED ORDER — OXYCODONE HCL 5 MG PO TABS: 5.0000 mg | ORAL_TABLET | Freq: Three times a day (TID) | ORAL | 0 refills | Status: DC | PRN

## 2024-09-12 MED ORDER — FLUTICASONE PROPIONATE 50 MCG/ACT NA SUSP: 1.0000 | Freq: Every day | NASAL | Status: DC | PRN

## 2024-09-12 NOTE — Plan of Care (Signed)

## 2024-09-12 NOTE — Discharge Summary (Signed)
 " Physician Discharge Summary   Patient: Kim Santiago MRN: [id number] DOB: 02/03/1935  Admit date:     09/06/2024  Discharge date: 09/12/2024  Discharge Physician: AIDA CHO   PCP: Marylynn Verneita CROME, MD   Recommendations at discharge:    Follow up with physician at the nursing home within 3 days of discharge Follow-up with [facility] orthopedic surgeon within 2 weeks of discharge  Discharge Diagnoses: Principal Problem:   Closed L4 vertebral fracture (HCC)  Resolved Problems:   * No resolved hospital problems. *  Hospital Course:  Kim Santiago is a 89 y.o. female with medical history significant for HTN, hypothyroidism, HLD, Sjogren's syndrome, SLE, depression, GERD, recent hospitalization from 08/28/2024 through 08/30/2024 for influenza A pneumonia and possibly bacterial pneumonia.  She had a fall on 08/27/2024 according to her daughter when she slid out of her bed onto the ground.  She developed low back pain as a result of the fall.  However, at that time, she was having significant respiratory symptoms so she did not pay much attention to her pain.  After recent discharge from the hospital, patient began to complain of significant low back pain.  Initially, she was able to ambulate with a walker.  However, pain got worse to the point that she could no longer ambulate.  Pain is sharp and severe and nonradiating.  No known relieving factors and is worse with little movement. She still has a cough from recent lung infection.  She is able to move her lower extremities.  No fever, chills, shortness of breath, chest pain, vomiting, GI or urinary symptoms.     ED Course: Vital signs temperature 99.3 F, respiratory 18, pulse 72, BP 154/73, oxygen  saturation 100% on room air.     MRI lumbar spine IMPRESSION: 1. Acute compression fracture of L4 with approximately 10% height loss and associated edema. No significant retropulsion. 2. Moderate to severe spinal canal stenosis at L4-L5 with  impingement of the traversing L5 nerve roots. 3. Additional moderate spinal canal stenosis at L3-4 with impingement of the traversing L4 nerve roots. 4. Chronic compression fracture of L1 status post kyphoplasty.       The patient was given IV fentanyl , IV morphine  and IV Zofran  in the ED.        Assessment and Plan:    Acute closed L4 vertebral fracture, most severe acute on chronic low back pain, s/p fall at home, left foot pain probably related to recent fall at home: Use Tylenol  and oxycodone  as needed for pain.  Minimize use of morphine  as able.  PT went OT recommended discharge to SNF     Left foot/ankle pain and swelling: X-ray of the left ankle showed osteopenia but no acute fracture or dislocation was noted.  CT scan of left ankle did not show any acute osseous abnormality.  There was evidence of calcaneal enthesopathy with thickening of distal Achilles tendon suggestive of tendinosis, plantar calcaneal spur with thickening of central cord of plantar fascia and acute plantar fasciitis cannot be excluded.  There is also evidence of subcutaneous edema of the heel. Recommend skilled PT and OT. Outpatient follow-up with orthopedic surgeon in 2 weeks.    Cough from recent influenza A pneumonia: Improved.  Positive influenza A test on 08/28/2024     Drug-induced SLE: Continue low-dose prednisone .       Chronic diastolic CHF: Compensated     Mild hyponatremia: Asymptomatic.  Chart review shows she has had chronic hyponatremia.  Patient and  daughter acknowledged history of chronic hyponatremia.     Mild low-grade fever with temperature of 100.5 F in the early hours of the morning on 09/09/2023: Fever appears to have resolved. Pyuria: No urinary symptoms.     Thrombocytopenia: Improved Chronic intermittent leukopenia.  Patient and daughter acknowledged history of leukopenia.     Comorbidities include chronic anemia, hypertension, depression, chronic moderate to severe  lumbar spinal stenosis, chronic compression fracture of L1 s/p kyphoplasty    Her condition has improved and she is deemed stable for discharge to SNF today.  Discharge plan was discussed with Bari, daughter, at the bedside.      Pain control - [place]  Controlled Substance Reporting System database was reviewed. and patient was instructed, not to drive, operate heavy machinery, perform activities at heights, swimming or participation in water activities or provide baby-sitting services while on Pain, Sleep and Anxiety Medications; until their outpatient Physician has advised to do so again. Also recommended to not to take more than prescribed Pain, Sleep and Anxiety Medications.  Consultants: Neurosurgeon Procedures performed: None Disposition: Skilled nursing facility Diet recommendation:  Discharge Diet Orders (From admission, onward)     Start     Ordered   09/12/24 0000  Diet - low sodium heart healthy        09/12/24 1056           Cardiac diet DISCHARGE MEDICATION: Allergies as of 09/12/2024       Reactions   Naprosyn [naproxen] Swelling   Amoxicillin  Rash   Tolerates first and third generation cephalosporins   Azathioprine  Nausea And Vomiting   Severe vomiting   Codeine Nausea And Vomiting   Hydroxychloroquine Hives, Nausea And Vomiting   Mycophenolate Mofetil Nausea Only   Orudis [ketoprofen] Hives   Sulfa Antibiotics Rash   Sulfathiazole Rash        Medication List     STOP taking these medications    methotrexate  2.5 MG tablet Commonly known as: RHEUMATREX       TAKE these medications    acetaminophen  500 MG tablet Commonly known as: TYLENOL  Take 500 mg by mouth every 4 (four) hours as needed.   atorvastatin  20 MG tablet Commonly known as: LIPITOR TAKE 1 TABLET BY MOUTH EVERY DAY AT 6 PM   Calcium  Carbonate-Vitamin D  600-200 MG-UNIT Tabs Take 2 tablets by mouth daily.   CRANBERRY PO Take 1 capsule by mouth 2 (two) times daily.    fluticasone  50 MCG/ACT nasal spray Commonly known as: FLONASE  Place 1 spray into both nostrils daily as needed for allergies.   folic acid  1 MG tablet Commonly known as: FOLVITE  Take 3 mg by mouth daily.   furosemide  20 MG tablet Commonly known as: LASIX  TAKE 1 TABLET BY MOUTH EVERY OTHER DAY AS NEEDED   levothyroxine  75 MCG tablet Commonly known as: SYNTHROID  TAKE 1 TABLET BY MOUTH (75MCG TOTAL) DAILY   metoprolol  succinate 25 MG 24 hr tablet Commonly known as: TOPROL -XL TAKE 1 TABLET BY MOUTH EVERY DAY   omeprazole  20 MG capsule Commonly known as: PRILOSEC Take 1 capsule (20 mg total) by mouth 2 (two) times daily as needed.   ondansetron  4 MG tablet Commonly known as: ZOFRAN  Take 1 tablet (4 mg total) by mouth every 6 (six) hours. What changed:  when to take this reasons to take this   oxyCODONE  5 MG immediate release tablet Commonly known as: Oxy IR/ROXICODONE  Take 1 tablet (5 mg total) by mouth every 8 (eight) hours as needed  for moderate pain (pain score 4-6).   polyethylene glycol 17 g packet Commonly known as: MIRALAX  / GLYCOLAX  Take 17 g by mouth daily.   predniSONE  2.5 MG tablet Commonly known as: DELTASONE  Take 2.5 mg by mouth daily.   PROBIOTIC PO Take 1 tablet by mouth daily.   sertraline  25 MG tablet Commonly known as: ZOLOFT  TAKE 1 TABLET (25 MG TOTAL) BY MOUTH DAILY.   Sodium Fluoride 5000 PPM 1.1 % Pste Generic drug: Sodium Fluoride See admin instructions.   tiZANidine  2 MG tablet Commonly known as: ZANAFLEX  Take 1 tablet (2 mg total) by mouth every 8 (eight) hours as needed for muscle spasms.   triamcinolone  cream 0.1 % Commonly known as: KENALOG  APPLY TO AFFECTED AREA TWICE A DAY        Contact information for follow-up providers     Ulis Bottcher, PA-C Follow up on 10/11/2024.   Specialty: Physician Assistant Why: follow up of lumbar fractures. Xrays prior Contact information: [address] [place], [address] [place]  [address] [phone]         [facility] ([facility] Schedule an appointment as soon as possible for a visit in 2 week(s).   Why: Left Achilles tendinosis Contact information: [address] [place]  [address] [phone]             Contact information for after-discharge care     Destination     [facility] .   Service: Skilled Nursing Contact information: [address] [place]  [address] [phone]                    Discharge Exam: Filed Weights   09/06/24 0610  Weight: 59 kg   GEN: NAD SKIN: Warm and dry.  Multiple bruises on the chest EYES: No pallor or icterus ENT: MMM CV: RRR PULM: CTA B ABD: soft, ND, NT, +BS CNS: AAO x 3, non focal EXT: Left ankle and foot tenderness with mild swelling about the left malleolus.   Condition at discharge: good  The results of significant diagnostics from this hospitalization (including imaging, microbiology, ancillary and laboratory) are listed below for reference.   Imaging Studies: CT ANKLE LEFT WO CONTRAST Result Date: 09/12/2024 CLINICAL DATA:  Ankle pain. EXAM: CT OF THE LEFT ANKLE WITHOUT CONTRAST TECHNIQUE: Multidetector CT imaging of the left ankle was performed according to the standard protocol. Multiplanar CT image reconstructions were also generated. RADIATION DOSE REDUCTION: This exam was performed according to the departmental dose-optimization program which includes automated exposure control, adjustment of the mA and/or kV according to patient size and/or use of iterative reconstruction technique. COMPARISON:  Left ankle radiographs dated 09/11/2024. FINDINGS: Bones/Joint/Cartilage Diffuse osseous demineralization. No acute fracture or dislocation. Ankle mortise appears congruent. No sizable tibiotalar joint effusion. Calcaneal enthesopathy at the insertion of the Achilles tendon. Plantar calcaneal spur. Minimal  degenerative changes of the ankle and hindfoot. Ligaments Ligaments are suboptimally evaluated by CT. Muscles and Tendons Fusiform thickening of the distal Achilles tendon is suggestive of tendinosis. Thickening of the central cord of the plantar fascia, acute plantar fasciitis cannot be excluded. Generalized muscular atrophy. Soft tissue No fluid collection or hematoma.  Subcutaneous edema of the heel. IMPRESSION: 1. No acute osseous abnormality. 2. Calcaneal enthesopathy with thickening of the distal Achilles tendon, suggestive of tendinosis. 3. Plantar calcaneal spur with thickening of the central cord of the plantar fascia. Acute plantar fasciitis cannot be excluded. 4. Subcutaneous edema  of the heel. Electronically Signed   By: Harrietta Sherry M.D.   On: 09/12/2024 10:30   DG Ankle 2 Views Left Result Date: 09/11/2024 EXAM: 2 VIEW(S) XRAY OF THE LEFT ANKLE 09/11/2024 11:04:00 AM CLINICAL HISTORY: Acute left ankle pain COMPARISON: None available. FINDINGS: BONES AND JOINTS: Achilles insertion enthesophyte with small undersurface calcaneal heel spur. Diffuse osteopenia. No acute fracture or dislocation. No malalignment. SOFT TISSUES: Artifact from overlying sock results in suboptimal evaluation of fine bony detail. Peripheral vascular atherosclerosis. IMPRESSION: 1. Osteopenia. Overlying sock artifact results in suboptimal evaluation of fine bony detail. Otherwise, no acute, displaced fracture or dislocation. Electronically signed by: Rogelia Myers MD 09/11/2024 02:34 PM EST RP Workstation: [id number]   MR LUMBAR SPINE WO CONTRAST Result Date: 09/06/2024 EXAM: MRI LUMBAR SPINE 09/06/2024 12:26:44 PM TECHNIQUE: Multiplanar multisequence MRI of the lumbar spine was performed without the administration of intravenous contrast. COMPARISON: Same day CT of the lumbar spine. CLINICAL HISTORY: Worsening lumbar pain. FINDINGS: BONES AND ALIGNMENT: Normal alignment. Chronic compression deformity of L1 with  vertebral augmentation cement noted. Up to 40% height loss of L1 centrally. No significant retropulsion. Irregularity of the L4 superior endplate with associated edema suggestive of acute compression fracture. Approximately 10% height loss of L4. No significant retropulsion. Schmorl nodes at multiple levels. Vertebral body heights otherwise maintained. Heterogeneous bone marrow signal intensity. SPINAL CORD: The conus medullaris extends to the L1-L2 level. SOFT TISSUES: No paraspinal mass. Mild atrophy of the paraspinal musculature. T12-L1: Small disc bulge. Mild to moderate facet arthrosis. Mild lateral recess narrowing. Mild spinal canal stenosis. No significant foraminal stenosis. L1-L2: Small disc bulge. Mild facet arthrosis. Thickening of the ligamentum flavum. Mild lateral recess narrowing. No significant spinal canal or foraminal stenosis. L2-L3: Diffuse disc bulge. Mild to moderate facet arthrosis. Thickening of the ligamentum flavum. Mild lateral recess narrowing. No significant spinal canal or foraminal stenosis. L3-L4: Diffuse disc bulge. Moderate facet arthrosis. Thickening of the ligamentum flavum. Lateral recess narrowing. Moderate spinal canal stenosis. Potential impingement of the traversing L4 nerve roots. No significant foraminal stenosis. L4-L5: Mild disc height loss. Diffuse disc bulge. Moderate to severe facet arthrosis. Thickening of the ligamentum flavum. Trace facet effusion on the right. Moderate to severe spinal canal stenosis with impingement of the traversing L5 nerve roots. No significant foraminal stenosis. L5-S1: Disc desiccation. Mild disc height loss posteriorly. Small disc bulge and central disc protrusion. Right subarticular annular fissure. Moderate facet arthrosis. Thickening of the ligamentum flavum. Mild lateral recess narrowing without significant spinal canal stenosis. No significant foraminal stenosis. IMPRESSION: 1. Acute compression fracture of L4 with approximately 10%  height loss and associated edema. No significant retropulsion. 2. Moderate to severe spinal canal stenosis at L4-L5 with impingement of the traversing L5 nerve roots. 3. Additional moderate spinal canal stenosis at L3-4 with impingement of the traversing L4 nerve roots. 4. Chronic compression fracture of L1 status post kyphoplasty. Electronically signed by: Donnice Mania MD 09/06/2024 01:09 PM EST RP Workstation: [id number]   CT L-SPINE NO CHARGE Result Date: 09/06/2024 EXAM: CT OF THE LUMBAR SPINE WITHOUT CONTRAST 09/06/2024 07:24:11 AM TECHNIQUE: CT of the lumbar spine was performed without the administration of intravenous contrast. Multiplanar reformatted images are provided for review. Automated exposure control, iterative reconstruction, and/or weight based adjustment of the mA/kV was utilized to reduce the radiation dose to as low as reasonably achievable. COMPARISON: MRI of the lumbar spine dated 10/15/2006. CLINICAL HISTORY: FINDINGS: BONES AND ALIGNMENT: Vertebral augmentation at L1. There is a mild-to-moderate compression deformity at  L1, mildly progressed in the interim. Mild dextroscoliosis. Mild downward bowing of the superior endplate of L4. No acute fracture or suspicious bone lesion. DEGENERATIVE CHANGES: Moderate diffuse chronic degenerative disc disease present at L2-3, L3-4 and L4-5. At L3-4 and L4-5, there is also moderate-to-severe central spinal canal stenosis and bilateral lateral recess stenosis, with likely impingement of the transiting nerves in the lateral recesses bilaterally. SOFT TISSUES: Moderate calcific atheromatous disease within the abdominal aorta. IMPRESSION: 1. Mild-to-moderate compression deformity at L1, mildly progressed in the interim, status post vertebral augmentation. 2. Mild dextroscoliosis and mild downward bowing of the superior endplate of L4. 3. Moderate-to-severe central spinal canal stenosis and bilateral lateral recess stenosis at L3-4 and L4-5, with likely  impingement of the transiting nerves in the lateral recesses bilaterally. 4. Moderate diffuse chronic degenerative disc disease at L2-3, L3-4, and L4-5. Electronically signed by: Evalene Coho MD 09/06/2024 08:07 AM EST RP Workstation: [id number]   CT Renal Stone Study Result Date: 09/06/2024 EXAM: CT UROGRAM 09/06/2024 07:24:11 AM TECHNIQUE: CT of the abdomen and pelvis was performed without the administration of intravenous contrast as per CT urogram protocol. Multiplanar reformatted images as well as MIP urogram images are provided for review. Automated exposure control, iterative reconstruction, and/or weight based adjustment of the mA/kV was utilized to reduce the radiation dose to as low as reasonably achievable. COMPARISON: 02/28/2018 CLINICAL HISTORY: Abdominal/flank pain, stone suspected; severe left sided lower back pain. FINDINGS: LOWER CHEST: Bibasilar peripheral predominant interstitial reticulation noted, likely reflecting sequelae of chronic inflammation. LIVER: The liver is unremarkable. GALLBLADDER AND BILE DUCTS: Status post cholecystectomy. No biliary ductal dilatation. SPLEEN: No acute abnormality. PANCREAS: No acute abnormality. ADRENAL GLANDS: No acute abnormality. KIDNEYS, URETERS AND BLADDER: No stones in the kidneys or ureters. No hydronephrosis. No perinephric or periureteral stranding. Urinary bladder is unremarkable. GI AND BOWEL: Small hiatal hernia. No small bowel dilatation. Liquid stool with fluid-air levels identified within the colon, which may reflect an underlying diarrheal illness. No signs of bowel wall thickening, inflammation, or distention noted. The appendix is not completely visualized, identified separate from the right lower quadrant bowel loops. No secondary signs of acute appendicitis. PERITONEUM AND RETROPERITONEUM: No ascites. No free air. VASCULATURE: Aorta is normal in caliber. Aortic atherosclerotic calcification. LYMPH NODES: No lymphadenopathy.  REPRODUCTIVE ORGANS: Uterus appears normal. No adnexal mass identified. BONES AND SOFT TISSUES: The bones are diffusely osteopenic. Unchanged cement augmentation of the L1 vertebral compression deformity. New mild superior endplate compression deformity involving the L4 vertebra. This is age indeterminate but is new from 02/28/2018. No focal soft tissue abnormality. IMPRESSION: 1. No nephrolithiasis, obstructive uropathy, or mass. 2. New mild superior endplate compression deformity involving the L4 vertebra, age indeterminate. 3. Liquid stool with fluid-air levels identified within the colon, possibly reflecting an underlying diarrheal illness, without bowel wall thickening, inflammation, or distention. Electronically signed by: Waddell Calk MD 09/06/2024 08:02 AM EST RP Workstation: [id number]   CT HEAD WO CONTRAST ( ) Result Date: 08/28/2024 EXAM: CT HEAD WITHOUT CONTRAST 08/28/2024 10:22:47 AM TECHNIQUE: CT of the head was performed without the administration of intravenous contrast. Automated exposure control, iterative reconstruction, and/or weight based adjustment of the mA/kV was utilized to reduce the radiation dose to as low as reasonably achievable. COMPARISON: 04/25/2024 CLINICAL HISTORY: AMS FINDINGS: BRAIN AND VENTRICLES: No acute hemorrhage. No evidence of acute infarct. No hydrocephalus. No extra-axial collection. No mass effect or midline shift. Age-related cerebral volume loss and moderate to severe periventricular and deep cerebral white matter disease. Moderate calcific  atheromatous disease within carotid siphons. ORBITS: No acute abnormality. Status post bilateral lens replacement. SINUSES: Mild scattered ethmoid sinus mucosal thickening. SOFT TISSUES AND SKULL: No acute soft tissue abnormality. No skull fracture. IMPRESSION: 1. No acute intracranial abnormality. 2. Age-related cerebral volume loss and moderate to severe periventricular and deep cerebral white matter disease. 3. Moderate  calcific atherosclerotic disease of the carotid siphons. 4. Mild scattered ethmoid sinus mucosal thickening. Electronically signed by: Evalene Coho MD 08/28/2024 10:47 AM EST RP Workstation: [id number]   DG Chest Port 1 View Result Date: 08/28/2024 CLINICAL DATA:  Possible sepsis. EXAM: PORTABLE CHEST 1 VIEW COMPARISON:  05/13/2021. FINDINGS: The heart is enlarged and the mediastinal contour is within normal limits. There is atherosclerotic calcification of the aorta. Patchy airspace disease is present at the lung bases. No effusion or pneumothorax is seen. No acute osseous abnormality. IMPRESSION: Patchy airspace disease at the lung bases, possible atelectasis or infiltrate. Electronically Signed   By: Leita Birmingham M.D.   On: 08/28/2024 10:13    Microbiology: Results for orders placed or performed during the hospital encounter of 09/06/24  Urine Culture     Status: Abnormal   Collection Time: 09/06/24  6:21 AM   Specimen: Urine, Clean Catch  Result Value Ref Range Status   Specimen Description   Final    URINE, CLEAN CATCH Performed at [facility] [address] [place], [place] [address]    Special Requests   Final    NONE Performed at [facility], [address] [place], [place] [address]    Culture MULTIPLE SPECIES PRESENT, SUGGEST RECOLLECTION (A)  Final   Report Status 09/07/2024 FINAL  Final    Labs: CBC: Recent Labs  Lab 09/06/24 0626 09/10/24 0448  WBC 3.5* 2.5*  NEUTROABS 1.5*  --   HGB 8.8* 8.8*  HCT 25.9* 25.8*  MCV 103.6* 102.4*  PLT 195 187   Basic Metabolic Panel: Recent Labs  Lab 09/06/24 0626 09/10/24 0448 09/12/24 0432  NA 134* 131* 132*  K 4.2 4.3 4.4  CL 99 96* 96*  CO2 27 28 28   GLUCOSE 93 92 88  BUN 11 14 13   CREATININE 0.71 0.63 0.65  CALCIUM  9.0 8.6* 8.5*   Liver Function Tests: No results for input(s): AST, ALT, ALKPHOS, BILITOT, PROT, ALBUMIN in the last 168 hours. CBG: No results for input(s):  GLUCAP in the last 168 hours.  Discharge time spent: greater than 30 minutes.  Signed: AIDA CHO, MD [facility] 09/12/2024 "

## 2024-09-12 NOTE — Progress Notes (Signed)
 Discharge criteria met

## 2024-09-13 ENCOUNTER — Inpatient Hospital Stay: Admitting: Internal Medicine

## 2024-09-14 ENCOUNTER — Other Ambulatory Visit: Payer: Self-pay

## 2024-09-14 ENCOUNTER — Emergency Department

## 2024-09-14 ENCOUNTER — Inpatient Hospital Stay
Admission: EM | Admit: 2024-09-14 | Discharge: 2024-09-20 | DRG: 065 | Disposition: A | Discharge status: Rehabilitation Facility | Attending: Internal Medicine | Admitting: Internal Medicine

## 2024-09-14 DIAGNOSIS — Z825 Family history of asthma and other chronic lower respiratory diseases: Secondary | ICD-10-CM | POA: Diagnosis not present

## 2024-09-14 DIAGNOSIS — Z9012 Acquired absence of left breast and nipple: Secondary | ICD-10-CM

## 2024-09-14 DIAGNOSIS — I252 Old myocardial infarction: Secondary | ICD-10-CM | POA: Diagnosis not present

## 2024-09-14 DIAGNOSIS — Z880 Allergy status to penicillin: Secondary | ICD-10-CM

## 2024-09-14 DIAGNOSIS — E890 Postprocedural hypothyroidism: Secondary | ICD-10-CM | POA: Diagnosis present

## 2024-09-14 DIAGNOSIS — Z8249 Family history of ischemic heart disease and other diseases of the circulatory system: Secondary | ICD-10-CM | POA: Diagnosis not present

## 2024-09-14 DIAGNOSIS — G9389 Other specified disorders of brain: Secondary | ICD-10-CM | POA: Diagnosis not present

## 2024-09-14 DIAGNOSIS — M810 Age-related osteoporosis without current pathological fracture: Secondary | ICD-10-CM | POA: Diagnosis present

## 2024-09-14 DIAGNOSIS — Z1721 Progesterone receptor positive status: Secondary | ICD-10-CM

## 2024-09-14 DIAGNOSIS — R110 Nausea: Secondary | ICD-10-CM | POA: Diagnosis not present

## 2024-09-14 DIAGNOSIS — I10 Essential (primary) hypertension: Secondary | ICD-10-CM | POA: Diagnosis present

## 2024-09-14 DIAGNOSIS — G8929 Other chronic pain: Secondary | ICD-10-CM | POA: Diagnosis present

## 2024-09-14 DIAGNOSIS — K219 Gastro-esophageal reflux disease without esophagitis: Secondary | ICD-10-CM | POA: Diagnosis present

## 2024-09-14 DIAGNOSIS — Z853 Personal history of malignant neoplasm of breast: Secondary | ICD-10-CM | POA: Diagnosis not present

## 2024-09-14 DIAGNOSIS — G8194 Hemiplegia, unspecified affecting left nondominant side: Secondary | ICD-10-CM | POA: Diagnosis present

## 2024-09-14 DIAGNOSIS — R29810 Facial weakness: Secondary | ICD-10-CM | POA: Diagnosis present

## 2024-09-14 DIAGNOSIS — R29708 NIHSS score 8: Secondary | ICD-10-CM | POA: Diagnosis present

## 2024-09-14 DIAGNOSIS — E871 Hypo-osmolality and hyponatremia: Secondary | ICD-10-CM | POA: Diagnosis present

## 2024-09-14 DIAGNOSIS — Z7401 Bed confinement status: Secondary | ICD-10-CM | POA: Diagnosis not present

## 2024-09-14 DIAGNOSIS — M3500 Sicca syndrome, unspecified: Secondary | ICD-10-CM | POA: Diagnosis present

## 2024-09-14 DIAGNOSIS — I5032 Chronic diastolic (congestive) heart failure: Secondary | ICD-10-CM | POA: Diagnosis not present

## 2024-09-14 DIAGNOSIS — S32040D Wedge compression fracture of fourth lumbar vertebra, subsequent encounter for fracture with routine healing: Secondary | ICD-10-CM | POA: Diagnosis not present

## 2024-09-14 DIAGNOSIS — Z8419 Family history of other disorders of kidney and ureter: Secondary | ICD-10-CM

## 2024-09-14 DIAGNOSIS — S32010S Wedge compression fracture of first lumbar vertebra, sequela: Secondary | ICD-10-CM | POA: Diagnosis not present

## 2024-09-14 DIAGNOSIS — Z882 Allergy status to sulfonamides status: Secondary | ICD-10-CM | POA: Diagnosis not present

## 2024-09-14 DIAGNOSIS — I69354 Hemiplegia and hemiparesis following cerebral infarction affecting left non-dominant side: Secondary | ICD-10-CM | POA: Diagnosis not present

## 2024-09-14 DIAGNOSIS — I618 Other nontraumatic intracerebral hemorrhage: Principal | ICD-10-CM | POA: Diagnosis present

## 2024-09-14 DIAGNOSIS — G936 Cerebral edema: Secondary | ICD-10-CM | POA: Diagnosis not present

## 2024-09-14 DIAGNOSIS — R54 Age-related physical debility: Secondary | ICD-10-CM | POA: Diagnosis present

## 2024-09-14 DIAGNOSIS — Z170 Estrogen receptor positive status [ER+]: Secondary | ICD-10-CM

## 2024-09-14 DIAGNOSIS — M329 Systemic lupus erythematosus, unspecified: Secondary | ICD-10-CM | POA: Diagnosis present

## 2024-09-14 DIAGNOSIS — I959 Hypotension, unspecified: Secondary | ICD-10-CM | POA: Diagnosis not present

## 2024-09-14 DIAGNOSIS — Z515 Encounter for palliative care: Secondary | ICD-10-CM | POA: Diagnosis not present

## 2024-09-14 DIAGNOSIS — M4856XA Collapsed vertebra, not elsewhere classified, lumbar region, initial encounter for fracture: Secondary | ICD-10-CM | POA: Diagnosis present

## 2024-09-14 DIAGNOSIS — H9193 Unspecified hearing loss, bilateral: Secondary | ICD-10-CM | POA: Diagnosis present

## 2024-09-14 DIAGNOSIS — I610 Nontraumatic intracerebral hemorrhage in hemisphere, subcortical: Secondary | ICD-10-CM | POA: Diagnosis present

## 2024-09-14 DIAGNOSIS — Z885 Allergy status to narcotic agent status: Secondary | ICD-10-CM

## 2024-09-14 DIAGNOSIS — I619 Nontraumatic intracerebral hemorrhage, unspecified: Secondary | ICD-10-CM | POA: Diagnosis present

## 2024-09-14 DIAGNOSIS — Z886 Allergy status to analgesic agent status: Secondary | ICD-10-CM | POA: Diagnosis not present

## 2024-09-14 DIAGNOSIS — Z888 Allergy status to other drugs, medicaments and biological substances status: Secondary | ICD-10-CM

## 2024-09-14 DIAGNOSIS — E785 Hyperlipidemia, unspecified: Secondary | ICD-10-CM | POA: Diagnosis present

## 2024-09-14 DIAGNOSIS — Z85850 Personal history of malignant neoplasm of thyroid: Secondary | ICD-10-CM

## 2024-09-14 DIAGNOSIS — S32000A Wedge compression fracture of unspecified lumbar vertebra, initial encounter for closed fracture: Secondary | ICD-10-CM

## 2024-09-14 DIAGNOSIS — E039 Hypothyroidism, unspecified: Secondary | ICD-10-CM | POA: Diagnosis not present

## 2024-09-14 DIAGNOSIS — F411 Generalized anxiety disorder: Secondary | ICD-10-CM | POA: Diagnosis not present

## 2024-09-14 DIAGNOSIS — I214 Non-ST elevation (NSTEMI) myocardial infarction: Secondary | ICD-10-CM | POA: Diagnosis not present

## 2024-09-14 DIAGNOSIS — Z66 Do not resuscitate: Secondary | ICD-10-CM | POA: Diagnosis present

## 2024-09-14 DIAGNOSIS — R531 Weakness: Secondary | ICD-10-CM | POA: Diagnosis present

## 2024-09-14 DIAGNOSIS — S32000S Wedge compression fracture of unspecified lumbar vertebra, sequela: Secondary | ICD-10-CM | POA: Diagnosis not present

## 2024-09-14 DIAGNOSIS — D61818 Other pancytopenia: Secondary | ICD-10-CM | POA: Diagnosis not present

## 2024-09-14 DIAGNOSIS — K5900 Constipation, unspecified: Secondary | ICD-10-CM | POA: Diagnosis not present

## 2024-09-14 DIAGNOSIS — Z800 Family history of malignant neoplasm of digestive organs: Secondary | ICD-10-CM

## 2024-09-14 LAB — COMPREHENSIVE METABOLIC PANEL WITH GFR
ALT: <5 U/L (ref 0–44)
AST: 18 U/L (ref 15–41)
Albumin: 3.4 g/dL — ABNORMAL LOW (ref 3.5–5.0)
Alkaline Phosphatase: 84 U/L (ref 38–126)
Anion gap: 10 (ref 5–15)
BUN: 14 mg/dL (ref 8–23)
CO2: 25 mmol/L (ref 22–32)
Calcium: 8.9 mg/dL (ref 8.9–10.3)
Chloride: 95 mmol/L — ABNORMAL LOW (ref 98–111)
Creatinine, Ser: 0.7 mg/dL (ref 0.44–1.00)
GFR, Estimated: >60 mL/min
Glucose, Bld: 128 mg/dL — ABNORMAL HIGH (ref 70–99)
Potassium: 4.3 mmol/L (ref 3.5–5.1)
Sodium: 130 mmol/L — ABNORMAL LOW (ref 135–145)
Total Bilirubin: 0.7 mg/dL (ref 0.0–1.2)
Total Protein: 6.5 g/dL (ref 6.5–8.1)

## 2024-09-14 LAB — CBC
HCT: 29.3 % — ABNORMAL LOW (ref 36.0–46.0)
Hemoglobin: 9.5 g/dL — ABNORMAL LOW (ref 12.0–15.0)
MCH: 34.7 pg — ABNORMAL HIGH (ref 26.0–34.0)
MCHC: 32.4 g/dL (ref 30.0–36.0)
MCV: 106.9 fL — ABNORMAL HIGH (ref 80.0–100.0)
Platelets: 213 K/uL (ref 150–400)
RBC: 2.74 MIL/uL — ABNORMAL LOW (ref 3.87–5.11)
RDW: 17.2 % — ABNORMAL HIGH (ref 11.5–15.5)
WBC: 3.5 K/uL — ABNORMAL LOW (ref 4.0–10.5)
nRBC: 0 % (ref 0.0–0.2)

## 2024-09-14 LAB — DIFFERENTIAL
Abs Immature Granulocytes: 0.09 K/uL — ABNORMAL HIGH (ref 0.00–0.07)
Basophils Absolute: 0.1 K/uL (ref 0.0–0.1)
Basophils Relative: 4 %
Eosinophils Absolute: 0.1 K/uL (ref 0.0–0.5)
Eosinophils Relative: 3 %
Immature Granulocytes: 3 %
Lymphocytes Relative: 35 %
Lymphs Abs: 1.3 K/uL (ref 0.7–4.0)
Monocytes Absolute: 0.6 K/uL (ref 0.1–1.0)
Monocytes Relative: 16 %
Neutro Abs: 1.4 K/uL — ABNORMAL LOW (ref 1.7–7.7)
Neutrophils Relative %: 39 %
Smear Review: NORMAL

## 2024-09-14 LAB — PROTIME-INR
INR: 1 (ref 0.8–1.2)
Prothrombin Time: 14 s (ref 11.4–15.2)

## 2024-09-14 LAB — APTT: aPTT: 30 s (ref 24–36)

## 2024-09-14 LAB — TROPONIN T, HIGH SENSITIVITY
Troponin T High Sensitivity: 27 ng/L — ABNORMAL HIGH (ref 0–19)
Troponin T High Sensitivity: 53 ng/L — ABNORMAL HIGH (ref 0–19)

## 2024-09-14 LAB — ETHANOL: Alcohol, Ethyl (B): <15 mg/dL

## 2024-09-14 LAB — CBG MONITORING, ED: Glucose-Capillary: 131 mg/dL — ABNORMAL HIGH (ref 70–99)

## 2024-09-14 MED ORDER — ONDANSETRON 4 MG PO TBDP: 4.0000 mg | ORAL_TABLET | Freq: Four times a day (QID) | ORAL | Status: DC | PRN

## 2024-09-14 MED ORDER — SENNOSIDES-DOCUSATE SODIUM 8.6-50 MG PO TABS
1.0000 | ORAL_TABLET | Freq: Every evening | ORAL | Status: DC | PRN
  Administered 2024-09-18: 1 via ORAL
  Filled 2024-09-14: qty 1

## 2024-09-14 MED ORDER — CLEVIDIPINE BUTYRATE 0.5 MG/ML IV EMUL: 0.0000 mg/h | INTRAVENOUS | Status: DC

## 2024-09-14 MED ORDER — ACETAMINOPHEN 650 MG RE SUPP: 650.0000 mg | Freq: Four times a day (QID) | RECTAL | Status: DC | PRN

## 2024-09-14 MED ORDER — GLYCOPYRROLATE 0.2 MG/ML IJ SOLN: 0.2000 mg | INTRAMUSCULAR | Status: DC | PRN

## 2024-09-14 MED ORDER — ONDANSETRON HCL 4 MG/2ML IJ SOLN: 4.0000 mg | Freq: Four times a day (QID) | INTRAMUSCULAR | Status: DC | PRN

## 2024-09-14 MED ORDER — POLYVINYL ALCOHOL 1.4 % OP SOLN: 1.0000 [drp] | Freq: Four times a day (QID) | OPHTHALMIC | Status: DC | PRN

## 2024-09-14 MED ORDER — MORPHINE SULFATE (PF) 2 MG/ML IV SOLN
2.0000 mg | INTRAVENOUS | Status: DC | PRN
  Administered 2024-09-14 – 2024-09-15 (×2): 2 mg via INTRAVENOUS
  Filled 2024-09-14 (×2): qty 1

## 2024-09-14 MED ORDER — GLYCOPYRROLATE 1 MG PO TABS: 1.0000 mg | ORAL_TABLET | ORAL | Status: DC | PRN

## 2024-09-14 MED ORDER — DIPHENHYDRAMINE HCL 50 MG/ML IJ SOLN: 25.0000 mg | INTRAMUSCULAR | Status: DC | PRN

## 2024-09-14 MED ORDER — SODIUM CHLORIDE 0.9 % IV SOLN: INTRAVENOUS | Status: AC

## 2024-09-14 MED ORDER — ONDANSETRON HCL 4 MG/2ML IJ SOLN
INTRAMUSCULAR | Status: AC
  Filled 2024-09-14: qty 2

## 2024-09-14 MED ORDER — LORAZEPAM 2 MG/ML IJ SOLN: 2.0000 mg | INTRAMUSCULAR | Status: DC | PRN

## 2024-09-14 MED ORDER — SODIUM CHLORIDE 0.9% FLUSH
3.0000 mL | Freq: Once | INTRAVENOUS | Status: AC
  Administered 2024-09-14: 3 mL via INTRAVENOUS

## 2024-09-14 MED ORDER — SODIUM CHLORIDE 0.9 % IV BOLUS
500.0000 mL | Freq: Once | INTRAVENOUS | Status: AC
  Administered 2024-09-14: 500 mL via INTRAVENOUS

## 2024-09-14 MED ORDER — ACETAMINOPHEN 325 MG PO TABS
650.0000 mg | ORAL_TABLET | Freq: Four times a day (QID) | ORAL | Status: DC | PRN
  Administered 2024-09-15 – 2024-09-19 (×7): 650 mg via ORAL
  Filled 2024-09-14 (×6): qty 2

## 2024-09-14 NOTE — Consult Note (Signed)
 NEUROLOGY CONSULT NOTE   Date of service: September 14, 2024 Patient Name: Kim Santiago MRN:  [id number] DOB:  [birth year] Chief Complaint: Worsened left sided weakness Requesting Provider: Nicholaus Rolland BRAVO, MD  History of Present Illness  Kim Santiago is a 89 y.o. female with a PMHx of stroke with residual left sided weakness, MI, HTN, hypothyroidism, HLD, Sjogren's syndrome, thyroid  CA s/p thyroidectomy, breast cancer, SLE, depression, GERD, recent hospitalization from 12/22/-08/30/2024 for influenza A pneumonia and possible overlapping bacterial pneumonia, recent L4 vertebral fracture who presents to the ED from her SNF as a Code Stroke after she experienced acute onset of worsened left sided weakness at 10:30 AM. She is not on a blood thinner. CT revealed an acute right thalamic ICH.  LKW: 1030 Modified rankin score: 4-Needs assistance to walk and tend to bodily needs ICH Score:1  NIHSS components Score: Comment  1a Level of Conscious 0[x]  1[]  2[]  3[]      1b LOC Questions 0[x]  1[]  2[]      Fully oriented  1c LOC Commands 0[x]  1[]  2[]       2 Best Gaze 0[x]  1[]  2[]       3 Visual 0[x]  1[]  2[]  3[]      4 Facial Palsy 0[]  1[]  2[x]  3[]      5a Motor Arm - left 0[]  1[x]  2[]  3[]  4[]  UN[]    5b Motor Arm - Right 0[x]  1[]  2[]  3[]  4[]  UN[]    6a Motor Leg - Left 0[]  1[x]  2[]  3[]  4[]  UN[]    6b Motor Leg - Right 0[x]  1[]  2[]  3[]  4[]  UN[]    7 Limb Ataxia 0[]  1[]  2[x]  UN[]     LUE and LLE  8 Sensory 0[]  1[x]  2[]  UN[]     Decreased to left face and arm  9 Best Language 0[x]  1[]  2[]  3[]      10 Dysarthria 0[]  1[x]  2[]  UN[]      11 Extinct. and Inattention 0[x]  1[]  2[]       TOTAL:   8      ROS  Comprehensive ROS performed and pertinent positives documented in HPI    Past History   Past Medical History:  Diagnosis Date   Arthritis    Breast cancer (HCC) 2017   left mastectomy done 11/2015   Breast cancer in female [facility]) 11/18/2015   Left: 3.9 cm tumor, T2, 1/2 sentinel nodes positive for  macro metastatic disease, N1, 3 negative nodes in the axillary tail, ER+,PR+, Her 2 neu, low Mammoprint score   Cancer (HCC)    thyroid  takes levothyroxine    CAP (community acquired pneumonia) 05/05/2021   Chronic kidney disease    UTI   Genetic screening 11/2015   Mammoprint of left breast cancer: Low risk for recurrence.    History of heart attack 06/12/2014   Hypertension    hypothyroidism    secondary to thyroidectomy for thyroid  ca   Hypothyroidism    Lupus    subcutaneous   Menopause 40s   natural, hot flashes and mood lability now gone, off prempro 7 months   Myocardial infarction [facility]) 2013   Osteoporosis    Osteopenia   Rosacea    Sjoegren syndrome    Stress-induced cardiomyopathy September of 2013   EF 35%. Peak troponin was 1.8.   Stroke [facility]) 10/2018    Past Surgical History:  Procedure Laterality Date   BACK SURGERY     BREAST BIOPSY Left 10/30/15   positive, done in Dr. Fredirick office   CARDIAC CATHETERIZATION  05/2012   [facility]. No  significant CAD. Ejection fraction of 35% due to stress-induced cardiomyopathy.   CHOLECYSTECTOMY     COLONOSCOPY     DILATION AND CURETTAGE OF UTERUS     KYPHOSIS SURGERY  Feb 2008   L1, Dr. Kathi   LUMBAR DISC SURGERY     L4-L5   MASTECTOMY Left 11/18/2015   positive   SENTINEL NODE BIOPSY Left 11/18/2015   Procedure: SENTINEL NODE BIOPSY;  Surgeon: Reyes LELON Cota, MD;  Location: [facility] ORS;  Service: General;  Laterality: Left;   SHOULDER ARTHROSCOPY  2004   Left, Dr. Maryl   SIMPLE MASTECTOMY WITH AXILLARY SENTINEL NODE BIOPSY Left 11/18/2015   Procedure: SIMPLE MASTECTOMY;  Surgeon: Reyes LELON Cota, MD;  Location: [facility] ORS;  Service: General;  Laterality: Left;   SPINE SURGERY     L4-5 diskectomy   THYROIDECTOMY     Thyroid  Cancer   TONSILLECTOMY     TUBAL LIGATION      Family History: Family History  Problem Relation Age of Onset   Kidney disease Mother    Heart disease Mother    COPD Father    Cancer  Father        esophageal   Kidney disease Sister    Cancer Brother 57       colon cancer (both brothers)   Cancer Brother    Heart attack Brother 79   Heart disease Brother    Breast cancer Neg Hx     Social History  reports that she has never smoked. She has never used smokeless tobacco. She reports that she does not drink alcohol  and does not use drugs.  Allergies[1]  Medications  Current Medications[2]  Vitals   There were no vitals filed for this visit.  There is no height or weight on file to calculate BMI.   Physical Exam   Constitutional: Appears well-developed and well-nourished.  Psych: Affect appropriate to situation.  Eyes: No scleral injection.  HENT: No OP obstruction. Hard of hearing - wears hearing aids Head: Normocephalic.  Respiratory: Effort normal, non-labored breathing.  Skin: WDI.   Neurologic Examination   See NIHSS  Labs/Imaging/Neurodiagnostic studies   CBC:  Recent Labs  Lab 09/15/2024 0448 09/14/24 1138  WBC 2.5* 3.5*  HGB 8.8* 9.5*  HCT 25.8* 29.3*  MCV 102.4* 106.9*  PLT 187 213   Basic Metabolic Panel:  Lab Results  Component Value Date   NA 132 (L) 09/12/2024   K 4.4 09/12/2024   CO2 28 09/12/2024   GLUCOSE 88 09/12/2024   BUN 13 09/12/2024   CREATININE 0.65 09/12/2024   CALCIUM  8.5 (L) 09/12/2024   GFRNONAA >60 09/12/2024   GFRAA >60 04/09/2020   Lipid Panel:  Lab Results  Component Value Date   LDLCALC 35 06/06/2024   HgbA1c:  Lab Results  Component Value Date   HGBA1C 4.9 01/19/2023   Urine Drug Screen: No results found for: LABOPIA, COCAINSCRNUR, LABBENZ, AMPHETMU, THCU, LABBARB  Alcohol  Level No results found for: [facility] INR  Lab Results  Component Value Date   INR 1.2 08/29/2024   APTT  Lab Results  Component Value Date   APTT 28 04/21/2022     ASSESSMENT  Kim Santiago is a 89 y.o. female with a complex PMHx as noted above including stroke with residual left sided weakness and  MI, who presents with acute worsening of her left sided weakness. CT head reveals an acute right thalamic ICH. She is not on a blood thinner.  - Exam reveals  left sided deficits that correspond with the ICH seen on CT. NIHSS 8.  - CT head: Acute Right thalamic hemorrhage (estimated 2 mL) with mild edema. No significant mass effect, no midline shift. No other acute intracranial abnormality. - Labs: Elevated troponin. Low WBC of 3.5. Platelets normal. Hgb 9.5. Glucose 128. Coags normal.  - Impression: Acute right thalamic ICH. Most likely etiology is felt to be a hypertensive bleed. A hemorrhagic underlying lesion is also possible.   RECOMMENDATIONS  - Admit to ICU - BP management with Cleviprex . Goal SBP 130-150.  - Frequent neuro checks - Repeat CT head in 12 hours - Isotonic IVF only. If mass effect is present on subsequent scans, consider hypertonic saline - Not a candidate for surgery at this time.  - No antiplatelet medications or anticoagulants. DVT prophylaxis with SCDs.  - PT/OT/Speech - Cardiac telemetry - MRI brain  Addendum: Patient's son-in-law and daughter arrived at bedside shortly after the stoke evaluation and stated that the patient is DNR/DNI and would want only comfort measures  ______________________________________________________________________    Bonney SHARK, Eulogia Dismore, MD [facility]     [1]  Allergies Allergen Reactions   Naprosyn [Naproxen] Swelling   Amoxicillin  Rash    Tolerates first and third generation cephalosporins   Azathioprine  Nausea And Vomiting    Severe vomiting   Codeine Nausea And Vomiting   Hydroxychloroquine Hives and Nausea And Vomiting   Mycophenolate Mofetil Nausea Only   Orudis [Ketoprofen] Hives   Sulfa Antibiotics Rash   Sulfathiazole Rash  [2]  Current Facility-Administered Medications:    clevidipine  (CLEVIPREX ) infusion 0.5 mg/mL, 0-21 mg/hr, Intravenous, Continuous, Cenia Zaragosa, MD   ondansetron  (ZOFRAN ) 4  MG/2ML injection, , , ,    sodium chloride  flush (NS) 0.9 % injection 3 mL, 3 mL, Intravenous, Once, Nicholaus Maize E, MD  Current Outpatient Medications:    acetaminophen  (TYLENOL ) 500 MG tablet, Take 500 mg by mouth every 4 (four) hours as needed., Disp: , Rfl:    atorvastatin  (LIPITOR) 20 MG tablet, TAKE 1 TABLET BY MOUTH EVERY DAY AT 6 PM, Disp: 90 tablet, Rfl: 3   Calcium  Carbonate-Vitamin D  600-200 MG-UNIT TABS, Take 2 tablets by mouth daily., Disp: , Rfl:    CRANBERRY PO, Take 1 capsule by mouth 2 (two) times daily., Disp: , Rfl:    fluticasone  (FLONASE ) 50 MCG/ACT nasal spray, Place 1 spray into both nostrils daily as needed for allergies., Disp: , Rfl:    folic acid  (FOLVITE ) 1 MG tablet, Take 3 mg by mouth daily., Disp: , Rfl:    furosemide  (LASIX ) 20 MG tablet, TAKE 1 TABLET BY MOUTH EVERY OTHER DAY AS NEEDED, Disp: 45 tablet, Rfl: 2   levothyroxine  (SYNTHROID ) 75 MCG tablet, TAKE 1 TABLET BY MOUTH ( TOTAL) DAILY, Disp: 90 tablet, Rfl: 2   metoprolol  succinate (TOPROL -XL) 25 MG 24 hr tablet, TAKE 1 TABLET BY MOUTH EVERY DAY, Disp: 90 tablet, Rfl: 3   omeprazole  (PRILOSEC) 20 MG capsule, Take 1 capsule (20 mg total) by mouth 2 (two) times daily as needed., Disp: , Rfl:    ondansetron  (ZOFRAN ) 4 MG tablet, Take 1 tablet (4 mg total) by mouth every 6 (six) hours. (Patient taking differently: Take 4 mg by mouth every 6 (six) hours as needed.), Disp: 20 tablet, Rfl: 0   oxyCODONE  (OXY IR/ROXICODONE ) 5 MG immediate release tablet, Take 1 tablet (5 mg total) by mouth every 8 (eight) hours as needed for moderate pain (pain score 4-6)., Disp: 9 tablet, Rfl: 0  polyethylene glycol (MIRALAX  / GLYCOLAX ) packet, Take 17 g by mouth daily., Disp: , Rfl:    predniSONE  (DELTASONE ) 2.5 MG tablet, Take 2.5 mg by mouth daily., Disp: , Rfl:    Probiotic Product (PROBIOTIC PO), Take 1 tablet by mouth daily., Disp: , Rfl:    sertraline  (ZOLOFT ) 25 MG tablet, TAKE 1 TABLET (25 MG TOTAL) BY MOUTH DAILY.,  Disp: 90 tablet, Rfl: 1   SODIUM FLUORIDE 5000 PPM 1.1 % PSTE, See admin instructions., Disp: , Rfl:    tiZANidine  (ZANAFLEX ) 2 MG tablet, Take 1 tablet (2 mg total) by mouth every 8 (eight) hours as needed for muscle spasms., Disp: 30 tablet, Rfl: 0   triamcinolone  cream (KENALOG ) 0.1 %, APPLY TO AFFECTED AREA TWICE A DAY, Disp: , Rfl:

## 2024-09-14 NOTE — ED Notes (Signed)
 Pt to go to CT2 on arrival

## 2024-09-14 NOTE — H&P (Signed)
 " History and Physical    TREASA BRADSHAW FMW:[id number] DOB: 04/16/1935 DOA: 09/14/2024  PCP: Marylynn Verneita CROME, MD (Confirm with patient/family/NH records and if not entered, this has to be entered at [facility] point of entry) Patient coming from: SNF  I have personally briefly reviewed patient's old medical records in [facility]  Chief Complaint: Left-sided weakness  HPI: MAIZE BRITTINGHAM is a 89 y.o. female with medical history significant of HTN, hypothyroidism, HLD, Sjgren's syndrome, SLE, GERD, anxiety/depression, sent from nursing home for evaluation of possible stroke.  Patient was recently hospitalized for influenza A pneumonia complicated with CAP and discharged to nursing home 2 days ago.  This morning, patient suddenly developed left-sided weakness around 10:30 AM, and patient sent to ED for further evaluation.  Patient denied any headache vision problems no nauseous vomiting.  ED Course: Afebrile, nontachycardic blood pressure 120/40.  CT head showed hemorrhagic stroke on the right side of thalamic region with no significant mass effect or midline shift.  After discussion with intensivist and neurology, family decided to start patient on comfort measure.  Review of Systems: Unable to perform, patient's somnolent  Past Medical History:  Diagnosis Date   Arthritis    Breast cancer (HCC) 2017   left mastectomy done 11/2015   Breast cancer in female [facility]) 11/18/2015   Left: 3.9 cm tumor, T2, 1/2 sentinel nodes positive for macro metastatic disease, N1, 3 negative nodes in the axillary tail, ER+,PR+, Her 2 neu, low Mammoprint score   Cancer (HCC)    thyroid  takes levothyroxine    CAP (community acquired pneumonia) 05/05/2021   Chronic kidney disease    UTI   Genetic screening 11/2015   Mammoprint of left breast cancer: Low risk for recurrence.    History of heart attack 06/12/2014   Hypertension    hypothyroidism    secondary to thyroidectomy for thyroid  ca   Hypothyroidism     Lupus    subcutaneous   Menopause 40s   natural, hot flashes and mood lability now gone, off prempro 7 months   Myocardial infarction [facility]) 2013   Osteoporosis    Osteopenia   Rosacea    Sjoegren syndrome    Stress-induced cardiomyopathy September of 2013   EF 35%. Peak troponin was 1.8.   Stroke [facility]) 10/2018    Past Surgical History:  Procedure Laterality Date   BACK SURGERY     BREAST BIOPSY Left 10/30/15   positive, done in Dr. Fredirick office   CARDIAC CATHETERIZATION  05/2012   [facility]. No significant CAD. Ejection fraction of 35% due to stress-induced cardiomyopathy.   CHOLECYSTECTOMY     COLONOSCOPY     DILATION AND CURETTAGE OF UTERUS     KYPHOSIS SURGERY  Feb 2008   L1, Dr. Kathi   LUMBAR DISC SURGERY     L4-L5   MASTECTOMY Left 11/18/2015   positive   SENTINEL NODE BIOPSY Left 11/18/2015   Procedure: SENTINEL NODE BIOPSY;  Surgeon: Reyes LELON Cota, MD;  Location: [facility] ORS;  Service: General;  Laterality: Left;   SHOULDER ARTHROSCOPY  2004   Left, Dr. Maryl   SIMPLE MASTECTOMY WITH AXILLARY SENTINEL NODE BIOPSY Left 11/18/2015   Procedure: SIMPLE MASTECTOMY;  Surgeon: Reyes LELON Cota, MD;  Location: [facility] ORS;  Service: General;  Laterality: Left;   SPINE SURGERY     L4-5 diskectomy   THYROIDECTOMY     Thyroid  Cancer   TONSILLECTOMY     TUBAL LIGATION  reports that she has never smoked. She has never used smokeless tobacco. She reports that she does not drink alcohol  and does not use drugs.  Allergies[1]  Family History  Problem Relation Age of Onset   Kidney disease Mother    Heart disease Mother    COPD Father    Cancer Father        esophageal   Kidney disease Sister    Cancer Brother 74       colon cancer (both brothers)   Cancer Brother    Heart attack Brother 54   Heart disease Brother    Breast cancer Neg Hx      Prior to Admission medications  Medication Sig Start Date End Date Taking? Authorizing Provider  acetaminophen   (TYLENOL ) 500 MG tablet Take 500 mg by mouth every 4 (four) hours as needed.    [provider]  atorvastatin  (LIPITOR) 20 MG tablet TAKE 1 TABLET BY MOUTH EVERY DAY AT 6 PM 04/10/24   Marylynn Verneita CROME, MD  Calcium  Carbonate-Vitamin D  600-200 MG-UNIT TABS Take 2 tablets by mouth daily.    [provider]  CRANBERRY PO Take 1 capsule by mouth 2 (two) times daily.    [provider]  fluticasone  (FLONASE ) 50 MCG/ACT nasal spray Place 1 spray into both nostrils daily as needed for allergies. 09/12/24   Jens Durand, MD  folic acid  (FOLVITE ) 1 MG tablet Take 3 mg by mouth daily. 08/13/21   [provider]  furosemide  (LASIX ) 20 MG tablet TAKE 1 TABLET BY MOUTH EVERY OTHER DAY AS NEEDED 08/14/24   Marylynn Verneita CROME, MD  levothyroxine  (SYNTHROID ) 75 MCG tablet TAKE 1 TABLET BY MOUTH ( TOTAL) DAILY 01/10/24   Marylynn Verneita CROME, MD  metoprolol  succinate (TOPROL -XL) 25 MG 24 hr tablet TAKE 1 TABLET BY MOUTH EVERY DAY 04/10/24   Marylynn Verneita CROME, MD  omeprazole  (PRILOSEC) 20 MG capsule Take 1 capsule (20 mg total) by mouth 2 (two) times daily as needed. 09/12/24   Jens Durand, MD  ondansetron  (ZOFRAN ) 4 MG tablet Take 1 tablet (4 mg total) by mouth every 6 (six) hours. Patient taking differently: Take 4 mg by mouth every 6 (six) hours as needed. 09/10/23   Marylynn Verneita CROME, MD  oxyCODONE  (OXY IR/ROXICODONE ) 5 MG immediate release tablet Take 1 tablet (5 mg total) by mouth every 8 (eight) hours as needed for moderate pain (pain score 4-6). 09/12/24   Jens Durand, MD  polyethylene glycol (MIRALAX  / GLYCOLAX ) packet Take 17 g by mouth daily.    [provider]  predniSONE  (DELTASONE ) 2.5 MG tablet Take 2.5 mg by mouth daily. 08/14/24   [provider]  Probiotic Product (PROBIOTIC PO) Take 1 tablet by mouth daily.    [provider]  sertraline  (ZOLOFT ) 25 MG tablet TAKE 1 TABLET (25 MG TOTAL) BY MOUTH DAILY. 03/30/24   Marylynn Verneita CROME, MD  SODIUM FLUORIDE 5000  PPM 1.1 % PSTE See admin instructions. 06/09/23   [provider]  tiZANidine  (ZANAFLEX ) 2 MG tablet Take 1 tablet (2 mg total) by mouth every 8 (eight) hours as needed for muscle spasms. 06/06/24   Marylynn Verneita CROME, MD  triamcinolone  cream (KENALOG ) 0.1 % APPLY TO AFFECTED AREA TWICE A DAY 03/21/21   [provider]    Physical Exam: Vitals:   09/14/24 1207 09/14/24 1219 09/14/24 1330 09/14/24 1345  BP: 115/66 (!) 122/40 (!) 119/44 (!) 116/43  Pulse: 61 (!) 58 63 63  Resp: 19  15 14 13   SpO2: 100% 99% 100% 100%  Weight:      Height:        Constitutional: NAD, calm, comfortable Vitals:   09/14/24 1207 09/14/24 1219 09/14/24 1330 09/14/24 1345  BP: 115/66 (!) 122/40 (!) 119/44 (!) 116/43  Pulse: 61 (!) 58 63 63  Resp: 19 15 14 13   SpO2: 100% 99% 100% 100%  Weight:      Height:       Eyes: PERRL, lids and conjunctivae normal ENMT: Mucous membranes are moist. Posterior pharynx clear of any exudate or lesions.Normal dentition.  Neck: normal, supple, no masses, no thyromegaly Respiratory: clear to auscultation bilaterally, no wheezing, no crackles. Normal respiratory effort. No accessory muscle use.  Cardiovascular: Regular rate and rhythm, no murmurs / rubs / gallops. No extremity edema. 2+ pedal pulses. No carotid bruits.  Abdomen: no tenderness, no masses palpated. No hepatosplenomegaly. Bowel sounds positive.  Musculoskeletal: no clubbing / cyanosis. No joint deformity upper and lower extremities. Good ROM, no contractures. Normal muscle tone.  Skin: no rashes, lesions, ulcers. No induration Neurologic: Right-sided facial droop. Sensation intact, DTR normal.  Strength 4/5 on left side and 5/5 Psychiatric: Lethargic, arousable to voice, oriented to person and place..    Labs on Admission: I have personally reviewed following labs and imaging studies  CBC: Recent Labs  Lab 09/10/24 0448 09/14/24 1138  WBC 2.5* 3.5*  NEUTROABS  --  1.4*  HGB 8.8* 9.5*  HCT  25.8* 29.3*  MCV 102.4* 106.9*  PLT 187 213   Basic Metabolic Panel: Recent Labs  Lab 09/10/24 0448 09/12/24 0432 09/14/24 1138  NA 131* 132* 130*  K 4.3 4.4 4.3  CL 96* 96* 95*  CO2 28 28 25   GLUCOSE 92 88 128*  BUN 14 13 14   CREATININE 0.63 0.65 0.70  CALCIUM  8.6* 8.5* 8.9   GFR: Estimated Creatinine Clearance: 41 mL/min (by C-G formula based on SCr of 0.7 mg/dL). Liver Function Tests: Recent Labs  Lab 09/14/24 1138  AST 18  ALT <5  ALKPHOS 84  BILITOT 0.7  PROT 6.5  ALBUMIN 3.4*   No results for input(s): LIPASE, AMYLASE in the last 168 hours. No results for input(s): AMMONIA in the last 168 hours. Coagulation Profile: Recent Labs  Lab 09/14/24 1138  INR 1.0   Cardiac Enzymes: No results for input(s): CKTOTAL, CKMB, CKMBINDEX, TROPONINI in the last 168 hours. BNP (last 3 results) No results for input(s): PROBNP in the last 8760 hours. HbA1C: No results for input(s): HGBA1C in the last 72 hours. CBG: Recent Labs  Lab 09/14/24 1138  GLUCAP 131*   Lipid Profile: No results for input(s): CHOL, HDL, LDLCALC, TRIG, CHOLHDL, LDLDIRECT in the last 72 hours. Thyroid  Function Tests: No results for input(s): TSH, T4TOTAL, FREET4, T3FREE, THYROIDAB in the last 72 hours. Anemia Panel: No results for input(s): VITAMINB12, FOLATE, FERRITIN, TIBC, IRON , RETICCTPCT in the last 72 hours. Urine analysis:    Component Value Date/Time   COLORURINE YELLOW (A) 09/06/2024 0621   APPEARANCEUR HAZY (A) 09/06/2024 0621   APPEARANCEUR Slightly cloudy 05/11/2024 1108   LABSPEC 1.012 09/06/2024 0621   PHURINE 7.0 09/06/2024 0621   GLUCOSEU NEGATIVE 09/06/2024 0621   GLUCOSEU NEGATIVE 05/11/2017 1143   HGBUR NEGATIVE 09/06/2024 0621   BILIRUBINUR NEGATIVE 09/06/2024 0621   BILIRUBINUR Negative 05/11/2024 1108   KETONESUR NEGATIVE 09/06/2024 0621   PROTEINUR NEGATIVE 09/06/2024 0621   UROBILINOGEN 0.2 07/04/2018 1134    UROBILINOGEN 0.2 05/11/2017 1143  NITRITE NEGATIVE 09/06/2024 0621   LEUKOCYTESUR SMALL (A) 09/06/2024 0621    Radiological Exams on Admission: CT HEAD CODE STROKE WO CONTRAST Result Date: 09/14/2024 EXAM: CT HEAD WITHOUT CONTRAST 09/14/2024 11:48:52 AM TECHNIQUE: CT of the head was performed without the administration of intravenous contrast. Automated exposure control, iterative reconstruction, and/or weight based adjustment of the mA/kV was utilized to reduce the radiation dose to as low as reasonably achievable. COMPARISON: Brain MRI 11/07/2018 and CT head 08/28/2024. CLINICAL HISTORY: 89 year old female. Increasing left side weakness, superimposed on residual left side deficit from stroke. FINDINGS: BRAIN AND VENTRICLES: Hyperdense acute hemorrhage in the right deep gray nuclei centered along the anterolateral thalamus near the posterior limb of the right internal capsule (series 3 image 16). Blood products are biconvex, measuring 20 x 14 x 12 mm, estimated volume 2 mL. Mild regional edema. No significant intracranial mass effect, no midline shift. Clinically advanced cerebral white matter hypodensity elsewhere appears stable. No other acute intracranial hemorrhage. Calcified atherosclerosis at the skull base. No suspicious intracranial vascular hyperdensity. No hydrocephalus. No extra-axial collection. ORBITS: No acute abnormality. SINUSES: Paranasal sinuses, tympanic cavities and mastoids remain well aerated. SOFT TISSUES AND SKULL: No acute soft tissue abnormality. No skull fracture. IMPRESSION: 1. Acute Right thalamic hemorrhage (estimated 2 mL) with mild edema. No significant mass effect, no midline shift. No other acute intracranial abnormality. 2. These results were communicated to Dr. Lindzen at 1158 hours on 09/14/2024 by text page via the [facility] messaging system. Electronically signed by: Helayne Hurst MD MD 09/14/2024 11:59 AM EST RP Workstation: [id number]    EKG: Independently reviewed.  Sinus  rhythm, first-degree AV block, no acute ST changes.  Assessment/Plan Principal Problem:   Hemorrhagic stroke (HCC)  (please populate well all problems here in Problem List. (For example, if patient is on BP meds at home and you resume or decide to hold them, it is a problem that needs to be her. Same for CAD, COPD, HLD and so on)  Left-sided paresis Acute right thalamic hemorrhagic stroke -ICU attending discussed case with patient's family including his daughter/POA, family expressed a concern about the patient has had significant deterioration functionally for last year, and they further worried about hemorrhagic stroke may further compromise her life quality.  Family members agreed upon each other to switch patient to full comfort care.  Family agreed upon no more blood work or imaging study. - Consult palliative care - As needed morphine  and Ativan  for now.  History of Sjogren's disease SLE HTN HLD Hypothyroidism - Discontinue all home medications  DVT prophylaxis: None Code Status: DNR/DNI Family Communication: Daughter at bedside Disposition Plan: Expect more than 2 midnight hospital stay Consults called: Neurology and ICU attending Admission status: MedSurg admission   Cort ONEIDA Mana MD [facility] Pager [phone]  09/14/2024, 2:59 PM       [1]  Allergies Allergen Reactions   Naprosyn [Naproxen] Swelling   Amoxicillin  Rash    Tolerates first and third generation cephalosporins   Azathioprine  Nausea And Vomiting    Severe vomiting   Codeine Nausea And Vomiting   Hydroxychloroquine Hives and Nausea And Vomiting   Mycophenolate Mofetil Nausea Only   Orudis [Ketoprofen] Hives   Sulfa Antibiotics Rash   Sulfathiazole Rash   "

## 2024-09-14 NOTE — ED Provider Notes (Signed)
 "  [facility] Provider Note    Event Date/Time   First MD Initiated Contact with Patient 09/14/24 1142     (approximate)   History   Code Stroke   HPI  Kim Santiago is a 89 y.o. female with history significant for HTN, hypothyroidism, HLD, Sjogren's syndrome, SLE, depression, GERD, recent hospitalization from 08/28/2024 through 08/30/2024 for influenza A pneumonia and possibly bacterial pneumonia and recent readmission from 12 31-1 6 4  L4 vertebral fracture who presents acutely as a code stroke.  Patient is currently residing at a skilled nursing facility and endorsed worsening weakness on the left side at 10:30 AM today.  She is not on any blood thinners.  Patient has a prior history of CVA and has weakness at baseline on the left side but not to this degree.  Point-of-care glucose was greater than 90 and route and she arrives by EMS  Patient's son-in-law and daughter arrived at bedside shortly thereafterwards and states that patient is DNR/DNI and would want only comfort measures only     Physical Exam   Triage Vital Signs: ED Triage Vitals  Encounter Vitals Group     BP      Girls Systolic BP Percentile      Girls Diastolic BP Percentile      Boys Systolic BP Percentile      Boys Diastolic BP Percentile      Pulse      Resp      Temp      Temp src      SpO2      Weight      Height      Head Circumference      Peak Flow      Pain Score      Pain Loc      Pain Education      Exclude from Growth Chart     Most recent vital signs: Vitals:   09/14/24 1207 09/14/24 1219  BP: 115/66 (!) 122/40  Pulse: 61 (!) 58  Resp: 19 15  SpO2: 100% 99%    Nursing Triage Note reviewed. Vital signs reviewed and patients oxygen  saturation is normoxic  General: Patient is well nourished, well developed, awake and alert, covered in emesis Head: Normocephalic and atraumatic Eyes: Normal inspection, extraocular muscles intact, no conjunctival  pallor Ear, nose, throat: Normal external exam Neck: Normal range of motion Respiratory: Patient is in no respiratory distress, lungs CTAB Cardiovascular: Patient is not tachycardic, RRR  GI: Abd SNT with no guarding or rebound  Extremities: pulses intact with good cap refills, no LE pitting edema or calf tenderness Neuro: The patient is alert and oriented to person, place, and time, but easily confused, has left facial droop, with 2/5 left upper extremity weakness and 2/5 left lower extremity weakness, decreased sensation on that side, no right-sided deficits,  Skin: Warm, dry, and intact   ED Results / Procedures / Treatments   Labs (all labs ordered are listed, but only abnormal results are displayed) Labs Reviewed  CBC - Abnormal; Notable for the following components:      Result Value   WBC 3.5 (*)    RBC 2.74 (*)    Hemoglobin 9.5 (*)    HCT 29.3 (*)    MCV 106.9 (*)    MCH 34.7 (*)    RDW 17.2 (*)    All other components within normal limits  DIFFERENTIAL - Abnormal; Notable for the following components:   Neutro  Abs 1.4 (*)    Abs Immature Granulocytes 0.09 (*)    All other components within normal limits  COMPREHENSIVE METABOLIC PANEL WITH GFR - Abnormal; Notable for the following components:   Sodium 130 (*)    Chloride 95 (*)    Glucose, Bld 128 (*)    Albumin 3.4 (*)    All other components within normal limits  CBG MONITORING, ED - Abnormal; Notable for the following components:   Glucose-Capillary 131 (*)    All other components within normal limits  TROPONIN T, HIGH SENSITIVITY - Abnormal; Notable for the following components:   Troponin T High Sensitivity 27 (*)    All other components within normal limits  PROTIME-INR  APTT  ETHANOL  URINALYSIS, COMPLETE (UACMP) WITH MICROSCOPIC  TROPONIN T, HIGH SENSITIVITY     EKG EKG and rhythm strip are interpreted by myself:   EKG: [Normal sinus rhythm] at heart rate of63, normal QRS duration, QTc 465,  nonspecific ST segments and T waves no ectopy EKG not consistent with Acute STEMI Rhythm strip: NSR in lead II   RADIOLOGY CT head: Thalamic stroke, independent review interpretation radiologist agrees    PROCEDURES:  Critical Care performed: Yes, see critical care procedure note(s)  .Critical Care  Performed by: Nicholaus Rolland BRAVO, MD Authorized by: Nicholaus Rolland BRAVO, MD   Critical care provider statement:    Critical care time (minutes):  30   Critical care was necessary to treat or prevent imminent or life-threatening deterioration of the following conditions:  CNS failure or compromise   Critical care was time spent personally by me on the following activities:  Development of treatment plan with patient or surrogate, discussions with consultants, evaluation of patient's response to treatment, examination of patient, ordering and review of laboratory studies, ordering and review of radiographic studies, ordering and performing treatments and interventions, pulse oximetry, re-evaluation of patient's condition and review of old charts   Care discussed with: admitting provider   Comments:     Need to urgently evaluate his acute stroke alert, evaluation and care of intracranial hemorrhage    MEDICATIONS ORDERED IN ED: Medications  clevidipine  (CLEVIPREX ) infusion 0.5 mg/mL (0 mg/hr Intravenous Hold 09/14/24 1238)  sodium chloride  flush (NS) 0.9 % injection 3 mL (3 mLs Intravenous Given 09/14/24 1209)  ondansetron  (ZOFRAN ) 4 MG/2ML injection (  Given 09/14/24 1141)  sodium chloride  0.9 % bolus 500 mL (500 mLs Intravenous New Bag/Given 09/14/24 1237)     IMPRESSION / MDM / ASSESSMENT AND PLAN / ED COURSE                                Differential diagnosis includes, but is not limited to, intracranial hemorrhage stroke, vasospasm, electrolyte derangement anemia  ED course: Patient presents with obvious focal neurological deficits.  She arrives as a code stroke and was evaluated upon  arrival glucose was confirmed not to be low and patient was brought to the CT scan.  Neurologist at [facility] side and CT head demonstrates a small thalamic intracranial hemorrhage.  Initial plan was to control blood pressure and to have a repeat CT head 12 hours after the first.  Plan was for ICU admission.  However patient's family arrived at bedside after this plan was formulated and requested the patient be made DNR/DNI as this has been discussed multiple times over the last month.  ICU attending in agreement and all plan for hospitalist admission.  Palliative care consult given   Clinical Course as of 09/14/24 1331  Thu Sep 14, 2024  1143 I and neurologist are at bedside [HD]  1200 Neurologist at bedside, states patient has a intraparencyhmal bleed. Now on clevaplex. Per neurologist, no need for CTA. No need for Nsurg involvement. OK to admit to our facility to ICU [HD]  1201 He does  [HD]  1201 Want repeat CT head done at 12 hours  [HD]  1203 CBC(!) None [HD]  1211 Patient's son-in-law updated at bedside and in agreement with the plan [HD]  1247 Case discussed with ICU attending [HD]  1302 ICU attending at bedside and had a prolonged discussion with the patient's family and the patient, all agree that patient would want comfort care and this has been expressed previously by the patient on multiple occasions.  Recommend hospitalist admission and palliative care consult. [HD]  1331 Case discussed with hospitalist for admission [HD]    Clinical Course User Index [HD] Nicholaus Rolland BRAVO, MD   -- Risk: 5 This patient has a high risk of morbidity due to further diagnostic testing or treatment. Rationale: This patients evaluation and management involve a high risk of morbidity due to the potential severity of presenting symptoms, need for diagnostic testing, and/or initiation of treatment that may require close monitoring. The differential includes conditions with potential for significant  deterioration or requiring escalation of care. Treatment decisions in the ED, including medication administration, procedural interventions, or disposition planning, reflect this level of risk. COPA: 5 The patient has the following acute or chronic illness/injury that poses a possible threat to life or bodily function: [X] : The patient has a potentially serious acute condition or an acute exacerbation of a chronic illness requiring urgent evaluation and management in the Emergency Department. The clinical presentation necessitates immediate consideration of life-threatening or function-threatening diagnoses, even if they are ultimately ruled out.   FINAL CLINICAL IMPRESSION(S) / ED DIAGNOSES   Final diagnoses:  Thalamic hemorrhage (HCC)  Left-sided weakness     Rx / DC Orders   ED Discharge Orders     None        Note:  This document was prepared using Dragon voice recognition software and may include unintentional dictation errors.   Nicholaus Rolland BRAVO, MD 09/14/24 1316  "

## 2024-09-14 NOTE — ED Notes (Signed)
 MD made aware of trending hypotension. See EMAR for new orders.

## 2024-09-14 NOTE — Progress Notes (Signed)
 CODE STROKE- PHARMACY COMMUNICATION   Time CODE STROKE called/page received:[id number]  Time response to CODE STROKE was made (in person or via phone): in-person  Time Stroke Kit retrieved from [employer] (only if needed):TNK not give due to hemorrhage  Name of Provider/Nurse contacted:Dr. Merrianne  Past Medical History:  Diagnosis Date   Arthritis    Breast cancer (HCC) 2017   left mastectomy done 11/2015   Breast cancer in female [facility]) 11/18/2015   Left: 3.9 cm tumor, T2, 1/2 sentinel nodes positive for macro metastatic disease, N1, 3 negative nodes in the axillary tail, ER+,PR+, Her 2 neu, low Mammoprint score   Cancer (HCC)    thyroid  takes levothyroxine    CAP (community acquired pneumonia) 05/05/2021   Chronic kidney disease    UTI   Genetic screening 11/2015   Mammoprint of left breast cancer: Low risk for recurrence.    History of heart attack 06/12/2014   Hypertension    hypothyroidism    secondary to thyroidectomy for thyroid  ca   Hypothyroidism    Lupus    subcutaneous   Menopause 40s   natural, hot flashes and mood lability now gone, off prempro 7 months   Myocardial infarction [facility]) 2013   Osteoporosis    Osteopenia   Rosacea    Sjoegren syndrome    Stress-induced cardiomyopathy September of 2013   EF 35%. Peak troponin was 1.8.   Stroke [facility]) 10/2018   Prior to Admission medications  Medication Sig Start Date End Date Taking? Authorizing Provider  acetaminophen  (TYLENOL ) 500 MG tablet Take 500 mg by mouth every 4 (four) hours as needed.    [provider]  atorvastatin  (LIPITOR) 20 MG tablet TAKE 1 TABLET BY MOUTH EVERY DAY AT 6 PM 04/10/24   Marylynn Verneita CROME, MD  Calcium  Carbonate-Vitamin D  600-200 MG-UNIT TABS Take 2 tablets by mouth daily.    [provider]  CRANBERRY PO Take 1 capsule by mouth 2 (two) times daily.    [provider]  fluticasone  (FLONASE ) 50 MCG/ACT nasal spray Place 1 spray into both nostrils daily as needed for  allergies. 09/12/24   Jens Durand, MD  folic acid  (FOLVITE ) 1 MG tablet Take 3 mg by mouth daily. 08/13/21   [provider]  furosemide  (LASIX ) 20 MG tablet TAKE 1 TABLET BY MOUTH EVERY OTHER DAY AS NEEDED 08/14/24   Marylynn Verneita CROME, MD  levothyroxine  (SYNTHROID ) 75 MCG tablet TAKE 1 TABLET BY MOUTH ( TOTAL) DAILY 01/10/24   Marylynn Verneita CROME, MD  metoprolol  succinate (TOPROL -XL) 25 MG 24 hr tablet TAKE 1 TABLET BY MOUTH EVERY DAY 04/10/24   Marylynn Verneita CROME, MD  omeprazole  (PRILOSEC) 20 MG capsule Take 1 capsule (20 mg total) by mouth 2 (two) times daily as needed. 09/12/24   Jens Durand, MD  ondansetron  (ZOFRAN ) 4 MG tablet Take 1 tablet (4 mg total) by mouth every 6 (six) hours. Patient taking differently: Take 4 mg by mouth every 6 (six) hours as needed. 09/10/23   Marylynn Verneita CROME, MD  oxyCODONE  (OXY IR/ROXICODONE ) 5 MG immediate release tablet Take 1 tablet (5 mg total) by mouth every 8 (eight) hours as needed for moderate pain (pain score 4-6). 09/12/24   Jens Durand, MD  polyethylene glycol (MIRALAX  / GLYCOLAX ) packet Take 17 g by mouth daily.    [provider]  predniSONE  (DELTASONE ) 2.5 MG tablet Take 2.5 mg by mouth daily. 08/14/24   [provider]  Probiotic Product (PROBIOTIC PO) Take 1 tablet by  mouth daily.    [provider]  sertraline  (ZOLOFT ) 25 MG tablet TAKE 1 TABLET (25 MG TOTAL) BY MOUTH DAILY. 03/30/24   Marylynn Verneita CROME, MD  SODIUM FLUORIDE 5000 PPM 1.1 % PSTE See admin instructions. 06/09/23   [provider]  tiZANidine  (ZANAFLEX ) 2 MG tablet Take 1 tablet (2 mg total) by mouth every 8 (eight) hours as needed for muscle spasms. 06/06/24   Marylynn Verneita CROME, MD  triamcinolone  cream (KENALOG ) 0.1 % APPLY TO AFFECTED AREA TWICE A DAY 03/21/21   [provider]    Leonor JAYSON Argyle ,PharmD 09/14/2024  12:24 PM

## 2024-09-14 NOTE — Plan of Care (Signed)

## 2024-09-14 NOTE — Consult Note (Signed)
 "  NAME:  Kim Santiago, MRN:  [id number], DOB:  12-08-1934, LOS: 0 ADMISSION DATE:  09/14/2024  CHIEF COMPLAINT:  ;eft arm weakness     History of Present Illness:  89 y.o. female with history significant for HTN, hypothyroidism, HLD, Sjogren's syndrome, SLE, depression, GERD, recent hospitalization from 08/28/2024 through 08/30/2024 for influenza A pneumonia and possibly bacterial pneumonia and recent readmission from 12 31-1 6 4  L4 vertebral fracture who presents acutely as a code stroke.  Patient is currently residing at a skilled nursing facility and endorsed worsening weakness on the left side at 10:30 AM today.  She is not on any blood thinners.  Patient has a prior history of CVA and has weakness at baseline   CT HEAD 09/14/2024 Acute Right thalamic hemorrhage (estimated 2 mL) with mild edema. No significant mass effect, no midline shift. No other acute intracranial abnormality.   Objective   Blood pressure (!) 122/40, pulse (!) 58, resp. rate 15, height 5' 3 (1.6 m), weight 58.9 kg, SpO2 99%.       No intake or output data in the 24 hours ending 09/14/24 1307 Filed Weights   09/14/24 1201  Weight: 58.9 kg    REVIEW OF SYSTEMS  PATIENT IS UNABLE TO PROVIDE COMPLETE REVIEW OF SYSTEMS DUE TO SEVERE CRITICAL ILLNESS   PHYSICAL EXAMINATION:  GENERAL:critically ill appearing, +facial droop MOUTH: Moist mucosal membrane NECK: Supple.  PULMONARY: Lungs clear to auscultation, +rhonchi, +wheezing CARDIOVASCULAR: S1 and S2.  Regular rate and rhythm GASTROINTESTINAL: Soft, nontender, -distended. Positive bowel sounds.  MUSCULOSKELETAL: No swelling, clubbing, or edema.  NEUROLOGIC: left facial droop, left sided hemiplegia SKIN:normal, warm to touch, Capillary refill delayed  Pulses present bilaterally     ASSESSMENT AND PLAN SYNOPSIS  BRAIN DAMAGE FROM ACUTE RT BRAIN BLEED DEBILITATING CVA  GOALS OF CARE DISCUSSION  The Clinical status was relayed to family in  detail-Daughter and Son at bedside  Updated and notified of patients medical condition- Explained to family course of therapy and the modalities  Patient with Progressive multiorgan failure with a very high probablity of a very minimal chance of meaningful recovery despite all aggressive and optimal medical therapy.   Family understands the situation.  They have consented and agreed to DNR/DNI and would like to proceed with Comfort care measures. Patient has been declining for over 1 year, especially over last few weeks.  I have dicussed aggressive medical care versus comfort care,  The family is asking and requesting for comfort care measures   Family are satisfied with Plan of action and management. All questions answered Palliative care team consulted, NO ICU admission at this time. ER doc notified      Labs   CBC: Recent Labs  Lab 09/10/24 0448 09/14/24 1138  WBC 2.5* 3.5*  NEUTROABS  --  1.4*  HGB 8.8* 9.5*  HCT 25.8* 29.3*  MCV 102.4* 106.9*  PLT 187 213    Basic Metabolic Panel: Recent Labs  Lab 09/10/24 0448 09/12/24 0432 09/14/24 1138  NA 131* 132* 130*  K 4.3 4.4 4.3  CL 96* 96* 95*  CO2 28 28 25   GLUCOSE 92 88 128*  BUN 14 13 14   CREATININE 0.63 0.65 0.70  CALCIUM  8.6* 8.5* 8.9   GFR: Estimated Creatinine Clearance: 41 mL/min (by C-G formula based on SCr of 0.7 mg/dL). Recent Labs  Lab 09/10/24 0448 09/14/24 1138  WBC 2.5* 3.5*    Liver Function Tests: Recent Labs  Lab 09/14/24 1138  AST 18  ALT <5  ALKPHOS 84  BILITOT 0.7  PROT 6.5  ALBUMIN 3.4*   No results for input(s): LIPASE, AMYLASE in the last 168 hours. No results for input(s): AMMONIA in the last 168 hours.  ABG    Component Value Date/Time   HCO3 29.0 (H) 02/09/2018 1433     Coagulation Profile: Recent Labs  Lab 09/14/24 1138  INR 1.0    Cardiac Enzymes: No results for input(s): CKTOTAL, CKMB, CKMBINDEX, TROPONINI in the last 168  hours.  HbA1C: Hgb A1c MFr Bld  Date/Time Value Ref Range Status  01/19/2023 04:29 PM 4.9 4.6 - 6.5 % Final    Comment:    Glycemic Control Guidelines for People with Diabetes:Non Diabetic:  <6%Goal of Therapy: <7%Additional Action Suggested:  >8%   11/08/2018 04:26 AM 4.9 4.8 - 5.6 % Final    Comment:    (NOTE) Pre diabetes:          5.7%-6.4% Diabetes:              >6.4% Glycemic control for   <7.0% adults with diabetes     CBG: Recent Labs  Lab 09/14/24 1138  GLUCAP 131*    Allergies Allergies[1]     Critical Care Time devoted to patient care services described in this note is 85 minutes.  Critical care was necessary to treat or prevent imminent or life-threatening deterioration.   PATIENT WITH VERY POOR PROGNOSIS    Nickolas Alm Cellar, M.D.  [facility]  Medical Director [facility] [place]           [1]  Allergies Allergen Reactions   Naprosyn [Naproxen] Swelling   Amoxicillin  Rash    Tolerates first and third generation cephalosporins   Azathioprine  Nausea And Vomiting    Severe vomiting   Codeine Nausea And Vomiting   Hydroxychloroquine Hives and Nausea And Vomiting   Mycophenolate Mofetil Nausea Only   Orudis [Ketoprofen] Hives   Sulfa Antibiotics Rash   Sulfathiazole Rash   "

## 2024-09-14 NOTE — ED Triage Notes (Signed)
 Pt BIB [facility] from [facility] due to new onset of increased weakness in L side. Pt has baseline weakness in L side due to previous stroke. LKW 10:30AM. No blood thinners per pt.

## 2024-09-14 NOTE — Progress Notes (Signed)
 SPIRITUAL CARE AND COUNSELING CONSULT NOTE   VISIT SUMMARY Family at bedside   SPIRITUAL ENCOUNTER                                                                                                                                                                      Type of Visit: Initial Care provided to:: Pt and family Conversation partners present during encounter: Nurse Referral source: Code page Reason for visit: Code OnCall Visit: Yes   SPIRITUAL FRAMEWORK  Presenting Themes: Other (comment) (Kim Santiago is ready to go be with her husband who died 10 yrs ago)   GOALS       INTERVENTIONS   Spiritual Care Interventions Made: Established relationship of care and support, Compassionate presence, Reflective listening, Narrative/life review    INTERVENTION OUTCOMES   Outcomes: Connection to spiritual care, Awareness of support  SPIRITUAL CARE PLAN   Spiritual Care Issues Still Outstanding: No further spiritual care needs at this time (see row info)    If immediate needs arise, please contact [facility] 24 hour on call [phone]   Sari Hugh, Chaplain  09/14/2024 12:26 PM

## 2024-09-15 ENCOUNTER — Inpatient Hospital Stay

## 2024-09-15 DIAGNOSIS — E039 Hypothyroidism, unspecified: Secondary | ICD-10-CM

## 2024-09-15 DIAGNOSIS — M3500 Sicca syndrome, unspecified: Secondary | ICD-10-CM

## 2024-09-15 DIAGNOSIS — E785 Hyperlipidemia, unspecified: Secondary | ICD-10-CM | POA: Diagnosis not present

## 2024-09-15 DIAGNOSIS — S32000S Wedge compression fracture of unspecified lumbar vertebra, sequela: Secondary | ICD-10-CM | POA: Diagnosis not present

## 2024-09-15 DIAGNOSIS — I619 Nontraumatic intracerebral hemorrhage, unspecified: Secondary | ICD-10-CM | POA: Diagnosis not present

## 2024-09-15 DIAGNOSIS — I10 Essential (primary) hypertension: Secondary | ICD-10-CM | POA: Diagnosis not present

## 2024-09-15 DIAGNOSIS — S32000A Wedge compression fracture of unspecified lumbar vertebra, initial encounter for closed fracture: Secondary | ICD-10-CM

## 2024-09-15 MED ORDER — LOSARTAN POTASSIUM 25 MG PO TABS
25.0000 mg | ORAL_TABLET | Freq: Every day | ORAL | Status: DC
  Filled 2024-09-15: qty 1

## 2024-09-15 MED ORDER — STROKE: EARLY STAGES OF RECOVERY BOOK: Freq: Once | Status: AC

## 2024-09-15 MED ORDER — ENSURE PLUS HIGH PROTEIN PO LIQD
237.0000 mL | Freq: Two times a day (BID) | ORAL | Status: DC
  Administered 2024-09-15 – 2024-09-20 (×8): 237 mL via ORAL

## 2024-09-15 MED ORDER — PREDNISONE 2.5 MG PO TABS
2.5000 mg | ORAL_TABLET | Freq: Every day | ORAL | Status: DC
  Administered 2024-09-15 – 2024-09-20 (×6): 2.5 mg via ORAL
  Filled 2024-09-15 (×6): qty 1

## 2024-09-15 MED ORDER — SERTRALINE HCL 50 MG PO TABS
25.0000 mg | ORAL_TABLET | Freq: Every day | ORAL | Status: DC
  Administered 2024-09-15 – 2024-09-20 (×6): 25 mg via ORAL
  Filled 2024-09-15: qty 1
  Filled 2024-09-15: qty 0.5
  Filled 2024-09-15 (×4): qty 1

## 2024-09-15 MED ORDER — METHOTREXATE SODIUM 2.5 MG PO TABS: 20.0000 mg | ORAL_TABLET | ORAL | Status: DC

## 2024-09-15 MED ORDER — OXYCODONE HCL 5 MG PO TABS
5.0000 mg | ORAL_TABLET | ORAL | Status: DC | PRN
  Administered 2024-09-16 – 2024-09-20 (×7): 5 mg via ORAL
  Filled 2024-09-15 (×9): qty 1

## 2024-09-15 MED ORDER — PANTOPRAZOLE SODIUM 40 MG PO TBEC
40.0000 mg | DELAYED_RELEASE_TABLET | Freq: Every day | ORAL | Status: DC
  Administered 2024-09-15 – 2024-09-20 (×6): 40 mg via ORAL
  Filled 2024-09-15 (×6): qty 1

## 2024-09-15 MED ORDER — ATORVASTATIN CALCIUM 20 MG PO TABS
20.0000 mg | ORAL_TABLET | Freq: Every day | ORAL | Status: DC
  Administered 2024-09-15 – 2024-09-20 (×6): 20 mg via ORAL
  Filled 2024-09-15 (×6): qty 1

## 2024-09-15 MED ORDER — OYSTER SHELL CALCIUM/D3 500-5 MG-MCG PO TABS
2.0000 | ORAL_TABLET | Freq: Every day | ORAL | Status: DC
  Administered 2024-09-15 – 2024-09-20 (×6): 2 via ORAL
  Filled 2024-09-15 (×6): qty 2

## 2024-09-15 MED ORDER — LOSARTAN POTASSIUM 25 MG PO TABS
25.0000 mg | ORAL_TABLET | Freq: Every day | ORAL | Status: DC
  Administered 2024-09-16 – 2024-09-20 (×5): 25 mg via ORAL
  Filled 2024-09-15 (×5): qty 1

## 2024-09-15 MED ORDER — PROBIOTIC 250 MG PO CAPS: ORAL_CAPSULE | Freq: Every day | ORAL | Status: DC

## 2024-09-15 MED ORDER — FOLIC ACID 1 MG PO TABS
3.0000 mg | ORAL_TABLET | Freq: Every day | ORAL | Status: DC
  Administered 2024-09-15 – 2024-09-20 (×6): 3 mg via ORAL
  Filled 2024-09-15 (×6): qty 3

## 2024-09-15 NOTE — [place] FL2 (Signed)
 " [place]  MEDICAID FL2 LEVEL OF CARE FORM     IDENTIFICATION  Patient Name: Kim Santiago Birthdate: [birth year] Sex: female Admission Date (Current Location): 09/14/2024  [facility] and [place] Number:  Chiropodist and Address:  [facility], [address], [place], [place] [address]      Provider Number: [id number]  Attending Physician Name and Address:  Caleen Qualia, MD  Relative Name and Phone Number:  [name]- daughter [phone]    Current Level of Care: Hospital Recommended Level of Care: Skilled Nursing Facility Prior Approval Number:    Date Approved/Denied:   PASRR Number: [id number]  Discharge Plan: SNF    Current Diagnoses: Patient Active Problem List   Diagnosis Date Noted   Lumbar compression fracture (HCC) 09/15/2024   Hypothyroidism    Hemorrhagic stroke (HCC) 09/14/2024   Closed L4 vertebral fracture (HCC) 09/06/2024   Thrombocytopenia 08/29/2024   Depression 08/29/2024   Sepsis (HCC) 08/29/2024   Influenza A with pneumonia 08/28/2024   Hyperkalemia due to angiotensin converting enzyme inhibitor (ACE-I) 07/02/2024   Radiculitis, cervical 06/06/2024   Drug-induced systemic lupus erythematosus, unspecified organ involvement status 06/06/2024   Stage 3a chronic kidney disease (HCC) 06/06/2024   Gastritis 09/11/2023   Chronic low back pain with bilateral sciatica 09/11/2023   Gait instability 04/21/2022   Iron  deficiency anemia 12/23/2021   Frequent epistaxis 12/23/2021   Chronic diastolic heart failure (HCC) 06/19/2021   COVID-19 virus infection 04/20/2021   Generalized weakness 11/23/2020   Vaccine reaction, initial encounter 08/20/2020   Hemiplegia affecting dominant side, post-stroke (HCC) 01/16/2020   Does use hearing aid 07/14/2019   Impaired balance as late effect of cerebrovascular accident 11/13/2018   History of CVA with residual deficit 11/13/2018   Leukopenia 11/11/2018   Anxiety about  health 07/05/2018   Mediastinal adenopathy 04/27/2018   Edema due to hypoalbuminemia 02/20/2018   Hospital discharge follow-up 02/20/2018   History of vertebral fracture 02/18/2018   GERD (gastroesophageal reflux disease) 10/31/2017   Hand joint pain 06/13/2017   Vaginal atrophy 05/11/2017   Venous insufficiency of both lower extremities 01/10/2017   Hyponatremia 10/07/2016   Subacute cutaneous lupus erythematosus 06/26/2016   Anemia of chronic disease 05/24/2016   Recurrent UTI 04/29/2016   TMJ syndrome 03/31/2016   Hearing loss 03/12/2016   Osteopenia 12/05/2015   Hx of thyroid  cancer 06/12/2014   Adenomatous polyp 06/12/2014   Grief 04/08/2014   Sjogren's syndrome 03/09/2014   Insomnia due to stress 03/06/2014   Dyslipidemia 12/28/2012   Medicare annual wellness visit, subsequent 12/28/2012   Hypertension 10/02/2011   Iatrogenic hypothyroidism 10/02/2011   Purpura 05/25/2011    Orientation RESPIRATION BLADDER Height & Weight     Self, Place, Time, Situation  Normal Incontinent Weight: 58.9 kg Height:  5' 3 (160 cm)  BEHAVIORAL SYMPTOMS/MOOD NEUROLOGICAL BOWEL NUTRITION STATUS      Continent Diet (Regular)  AMBULATORY STATUS COMMUNICATION OF NEEDS Skin   Extensive Assist Verbally Other (Comment) (Purpura to chest and arms, bruising and deep red patches)                       Personal Care Assistance Level of Assistance  Bathing, Feeding, Dressing Bathing Assistance: Maximum assistance Feeding assistance: Limited assistance Dressing Assistance: Maximum assistance     Functional Limitations Info  Sight, Hearing, Speech Sight Info: Impaired (Glasses) Hearing Info: Adequate Speech Info: Adequate    SPECIAL CARE FACTORS FREQUENCY  PT (By licensed PT), OT (  By licensed OT)     PT Frequency: 5 days per week OT Frequency: 5 days per week            Contractures Contractures Info: Not present    Additional Factors Info  Code Status, Allergies Code  Status Info: DNR Allergies Info: Naprosyn (naproxen) High Allergy Swelling   Amoxicillin  Low Allergy Rash Tolerates first and third generation cephalosporins  Azathioprine  Low Intolerance Nausea And Vomiting Severe vomiting  Codeine Low Intolerance Nausea And Vomiting   Hydroxychloroquine Low Allergy Hives, Nausea And Vomiting   Mycophenolate Mofetil Low Intolerance Nausea Only   Orudis (ketoprofen) Low Allergy Hives   Sulfa Antibiotics Low  Rash   Sulfathiazole Low Allergy Rash           Current Medications (09/15/2024):  This is the current hospital active medication list Current Facility-Administered Medications  Medication Dose Route Frequency Provider Last Rate Last Admin   [START ON 09/16/2024]  stroke: early stages of recovery book   Does not apply Once Amin, Sumayya, MD       acetaminophen  (TYLENOL ) tablet 650 mg  650 mg Oral Q6H PRN Laurita Cort DASEN, MD   650 mg at 09/15/24 1412   Or   acetaminophen  (TYLENOL ) suppository 650 mg  650 mg Rectal Q6H PRN Laurita Cort DASEN, MD       artificial tears ophthalmic solution 1 drop  1 drop Both Eyes QID PRN Laurita Cort DASEN, MD       atorvastatin  (LIPITOR) tablet 20 mg  20 mg Oral Daily Amin, Sumayya, MD   20 mg at 09/15/24 1402   calcium -vitamin D  (OSCAL WITH D) 500-5 MG-MCG per tablet 2 tablet  2 tablet Oral Daily Amin, Sumayya, MD   2 tablet at 09/15/24 1402   diphenhydrAMINE  (BENADRYL ) injection 25 mg  25 mg Intravenous Q4H PRN Laurita Cort T, MD       feeding supplement (ENSURE PLUS HIGH PROTEIN) liquid 237 mL  237 mL Oral BID BM Amin, Sumayya, MD   237 mL at 09/15/24 1402   folic acid  (FOLVITE ) tablet 3 mg  3 mg Oral Daily Amin, Sumayya, MD   3 mg at 09/15/24 1401   [START ON 09/16/2024] losartan  (COZAAR ) tablet 25 mg  25 mg Oral Daily Amin, Sumayya, MD       ondansetron  (ZOFRAN -ODT) disintegrating tablet 4 mg  4 mg Oral Q6H PRN Laurita Cort T, MD       Or   ondansetron  (ZOFRAN ) injection 4 mg  4 mg Intravenous Q6H PRN Laurita Cort T, MD       oxyCODONE   (Oxy IR/ROXICODONE ) immediate release tablet 5 mg  5 mg Oral Q4H PRN Amin, Sumayya, MD       pantoprazole  (PROTONIX ) EC tablet 40 mg  40 mg Oral Daily Amin, Sumayya, MD   40 mg at 09/15/24 1402   predniSONE  (DELTASONE ) tablet 2.5 mg  2.5 mg Oral Daily Amin, Sumayya, MD   2.5 mg at 09/15/24 1412   senna-docusate (Senokot-S) tablet 1 tablet  1 tablet Oral QHS PRN Laurita Cort T, MD       sertraline  (ZOLOFT ) tablet 25 mg  25 mg Oral Daily Amin, Sumayya, MD   25 mg at 09/15/24 1402     Discharge Medications: Please see discharge summary for a list of discharge medications.  Relevant Imaging Results:  Relevant Lab Results:   Additional Information SSN: [id number]  Nathanael CHRISTELLA Ring, RN     "

## 2024-09-15 NOTE — Progress Notes (Signed)
 PT Cancellation Note  Patient Details Name: Kim Santiago MRN: [id number] DOB: [birth year]   Cancelled Treatment:    Reason Eval/Treat Not Completed: Other (comment). Pt pending further imaging, PT to follow up as able and medically appropriate, per MD okay to hold until imaging is complete.    Doyal Shams PT, DPT 2:39 PM,09/15/2024

## 2024-09-15 NOTE — Assessment & Plan Note (Addendum)
 Blood pressure currently within goal. -Goal blood pressure at 120 due to hemorrhagic stroke. - Continue home losartan  -Holding home metoprolol  as heart rate was mostly in 50s

## 2024-09-15 NOTE — Consult Note (Signed)
 "                                                                                   Consultation Note Date: 09/15/2024 at 1015  Patient Name: Kim Santiago  DOB: Feb 27, 1935  MRN: [id number]  Age / Sex: 89 y.o., female  PCP: Marylynn Verneita CROME, MD Referring Physician: Caleen Qualia, MD  HPI/Patient Profile: 89 y.o. female  with past medical history of hypothyroidism, GERD, anxiety, depression, HTN, Sjogren syndrome, HLD, SLE with purpura admitted on 09/14/2024 with chief complaint of left-sided weakness.  On presentation to ED, CT of head revealed hemorrhagic stroke to right thalamic region with no significant mass effect or midline shift.  Initially, PCCM and neurology were consulted.  After discussions with these services, family decided to shift to comfort focused care.  PMT was consulted to support patient and family with comfort care measures.  Clinical Assessment and Goals of Care: Extensive chart review completed prior to meeting patient including: -Labs: Mild hyponatremia-130, albumin mildly decreased at 3.4, hemoglobin decreased to 9.5 -Vital signs: Mildly hypotensive but mentating well, oxygen  saturation maintaining at 98% on room air -Progress notes: Reviewed CCM note from 1/8 wherein decision was made to initiate comfort measures  I then met with patient, her son-in-law Selinda, and his daughter [name] at bedside to discuss diagnosis prognosis, GOC, EOL wishes, disposition and options.  During our discussion, Selinda reached patient's daughter/POA Christy via speaker phone.  I introduced Palliative Medicine as specialized medical care for people living with serious illness. It focuses on providing relief from the symptoms and stress of a serious illness. The goal is to improve quality of life for both the patient and the family.  As far as functional and nutritional status patient endorses she was independent with ADLs prior to contracting influenza a few weeks ago.  She recalls having a  stroke in 2018.  However, she had minimal deficits and was able to be independent for quite some time.  Family endorses she has had significant decline over the past several weeks to months.  Her functional status requires a rollator and assistance.  She was at home being cared for by her daughter until recently when her daughter was unable to provide the care she needed and patient was transferred to [facility] for STR rehab.  Advance directives and daughters of care reviewed with patient and family.  Patient and family at bedside endorse that patient would never be accepting of artificial means or life-prolonging measures.  They continue to endorse DNR limited interventions, never utilizing a feeding tube or artificial hydration.  However, they would like to know what can be done to focus on her quality of life and maximize any mobility she might have after this acute stroke.  Discussed that patient's initial presentation to ED appeared to be a debilitating CVA with brain damage from an acute right brain bleed.  At that time, family shares they thought she was facing end-of-life.  However, later in the day yesterday as well as today patient is mentating alert and oriented x 4 and able to move it her left upper extremity which she was not able to do yesterday.  They shares that her do not think comfort measures are appropriate anymore however, they are not clear what the next best step is.  Discussed potential for continuing with current comfort care measures, focusing on symptom management, avoiding aggressive treatments, being discharged home with hospice services.  We also reviewed lifting comfort care orders, focusing on treating the treatable, PT/OT evaluation, and planning for patient if medically stable and able to return to STR for continued rehab.  Family shares that since patient has shown signs of such drastic improvement they would like to pursue short-term rehab.  However, they do not want to  escalate care, they do not want aggressive measures, and do not want any further testing or interventions other than what would be needed for her to be able to transfer to short-term rehab.  Family would like to speak with attending prior to making any changes to plan of care.  After meeting with the patient's family, I notified TOC, RN, and attending of family's wishes to further discuss lifting comfort care orders and focusing on rehab options.  Attending came bedside and spoke with family.  After this meeting, stroke workup and protocol was put in place.  However, I was given messages from medical staff saying that family wanted to speak with me prior to proceeding with MRI.  Dr. Caleen and I returned to bedside speak with patient and patient's daughter/HCPOA Christy.  Discussed that complete stroke workup is the recommendation.  However, family wishes to proceed with only the CT without contrast.  Reviewed this with give some information on whether or not patient's bleed is active or stable.  If patient's bleed is stable, it would be appropriate for PT/OT to evaluate.  However, if bleed is unstable it would not be appropriate for PT/OT and comfort measures to be continued.  Family endorses understanding and wishes to proceed only with CT.  CT ordered.  Dr. Caleen discontinued other orders.  I personally discontinued comfort care orders and measures.  Plan remains to await results of CT with hopes of patient being able to work with PT/OT.  TOC following closely for discharge planning to STR rehab when medically stable.  PMT will continue to follow and support.  Family has PMT contact info and was encouraged to call with any acute palliative needs during this hospitalization.  Primary Decision Maker PATIENT  Physical Exam Vitals reviewed.  Constitutional:      General: She is not in acute distress.    Appearance: She is normal weight.  HENT:     Head: Normocephalic.     Mouth/Throat:      Mouth: Mucous membranes are moist.  Eyes:     Pupils: Pupils are equal, round, and reactive to light.  Pulmonary:     Effort: Pulmonary effort is normal.  Abdominal:     Palpations: Abdomen is soft.  Musculoskeletal:     Comments: Mild tremors or LUE MAETC  Skin:    General: Skin is warm and dry.  Neurological:     Mental Status: She is alert and oriented to person, place, and time.  Psychiatric:        Mood and Affect: Mood normal.        Behavior: Behavior normal.     Palliative Assessment/Data: 50-60%     Thank you for this consult. Palliative medicine will continue to follow and assist holistically.   I personally spent a total of 75 minutes in the care of the patient today including preparing to see  the patient, getting/reviewing separately obtained history, performing a medically appropriate exam/evaluation, counseling and educating, and documenting clinical information in the EHR.   Signed by: Lamarr Gunner, DNP, FNP-BC Palliative Medicine   Please contact Palliative Medicine Team providers via [facility] for questions and concerns.                "

## 2024-09-15 NOTE — Assessment & Plan Note (Signed)
-   Continue home Crestor

## 2024-09-15 NOTE — Assessment & Plan Note (Signed)
 Continue home Synthroid 

## 2024-09-15 NOTE — Progress Notes (Signed)
 SPIRITUAL CARE AND COUNSELING CONSULT NOTE   VISIT SUMMARY Family at bedside and is very important to her  SPIRITUAL ENCOUNTER                                                                                                                                                                      Type of Visit: Follow up Care provided to:: Pt and family Conversation partners present during encounter: Nurse Referral source: Chaplain assessment Reason for visit: Routine spiritual support OnCall Visit: No   SPIRITUAL FRAMEWORK  Presenting Themes: Community and relationships   GOALS       INTERVENTIONS   Spiritual Care Interventions Made: Narrative/life review, Other (comment) (Family is important to this pt and her family has been at her bedside)    INTERVENTION OUTCOMES   Outcomes: Connection to spiritual care, Awareness of support  SPIRITUAL CARE PLAN   Spiritual Care Issues Still Outstanding: No further spiritual care needs at this time (see row info)    If immediate needs arise, please contact [facility] 24 hour on call [phone]   Sari Hugh, Chaplain  09/15/2024 10:32 AM

## 2024-09-15 NOTE — Progress Notes (Signed)
 Rounds completed with Curt[place], BMT. Multiple family members and palliative NP at bedside. Will follow up

## 2024-09-15 NOTE — Plan of Care (Signed)

## 2024-09-15 NOTE — Progress Notes (Signed)
 " Progress Note   Patient: Kim Santiago FMW:[id number] DOB: December 23, 1934 DOA: 09/14/2024     1 DOS: the patient was seen and examined on 09/15/2024   Brief hospital course: Partly taken from prior notes.  Kim Santiago is a 89 y.o. female with medical history significant of HTN, hypothyroidism, HLD, Sjgren's syndrome, SLE, GERD, anxiety/depression, sent from nursing home for evaluation of left-sided weakness.  Patient was recently hospitalized for influenza A pneumonia complicated with CAP and discharged to nursing home 2 days ago.   On presentation vital stable, CT head with hemorrhagic stroke on right side of thalamic region with no significant mass effect or midline shift.  Initially PCCM and neurology was consulted, later family decided to proceed with comfort measures only.  Palliative care was consulted.  1/9: Vital stable, patient with significant improvement in alertness and left-sided weakness.  Family now revoked full comfort care status and would like some workup including PT and OT evaluation with the hope that she can go back to rehab.  They do not want any MRI or CTA or lab workup. Discussed with neurology and a repeat CT head was ordered and if the bleed seems stable she can be evaluated with PT and OT. They do not want any surgical intervention and patient will remain DNR at this time.  Assessment and Plan: * Hemorrhagic stroke [facility]) Patient was found to have acute right thalamic hemorrhagic stroke with mild edema or midline shift on initial imaging.  Further workup was not done initially as family wants full comfort care which was revoked this morning.  Mentation is at baseline with improving left-sided deficit.  -Repeat CT head ordered to check for stability as family wants to stay conservative and does not want full stroke workup. -Not a candidate for any antiplatelet or blood thinner at this time. - Repeat imaging to see the stability  -PT/OT evaluation-if repeat  CT is stable  Hypertension Blood pressure currently within goal. -Goal blood pressure at 120 due to hemorrhagic stroke. - Continue home losartan  -Holding home metoprolol  as heart rate was mostly in 50s  Sjogren's syndrome History of lupus. - Patient was on weekly methotrexate  and low-dose prednisone  at home -Continuing prednisone  -Will resume weekly methotrexate  on discharge  Hypothyroidism - Continue home Synthroid   Dyslipidemia - Continue home Crestor  Lumbar compression fracture (HCC) Patient was recently sent to rehab when she was found to have a lumbar compression fracture. No acute concern -PT/OT evaluation -Continue with pain management   Subjective: Patient was seen and examined today.  Denies any significant pain.  Alert and oriented, left-sided deficit with significant improvement.  Family at bedside.  They now does not want full comfort care and hoping she can go back to rehab.  Physical Exam: Vitals:   09/14/24 1748 09/15/24 0430 09/15/24 0736 09/15/24 1406  BP: 137/61 (!) 120/52 118/60 (!) 109/42  Pulse: 66 69 67 70  Resp: 18 18 17 18   Temp: 98.6 F (37 C) 97.6 F (36.4 C) 98 F (36.7 C) 98.2 F (36.8 C)  TempSrc:   Axillary Oral  SpO2: 100% 97% 97% 99%  Weight:      Height:       General.  Frail and elderly lady, in no acute distress.  Multiple large ecchymoses. Pulmonary.  Lungs clear bilaterally, normal respiratory effort. CV.  Regular rate and rhythm, no JVD, rub or murmur. Abdomen.  Soft, nontender, nondistended, BS positive. CNS.  Alert and oriented .  No obvious focal neurologic  deficit. Extremities.  No edema,  pulses intact and symmetrical. Psychiatry.  Judgment and insight appears normal.   Data Reviewed: Prior data reviewed  Family Communication: Discussed with daughter and SIL along with other family members in the room.  Disposition: Status is: Inpatient Remains inpatient appropriate because: Severity of illness  Planned Discharge  Destination: Skilled nursing facility  DVT prophylaxis.  SCDs Time spent: 50 minutes  This record has been created using Conservation officer, historic buildings. Errors have been sought and corrected,but may not always be located. Such creation errors do not reflect on the standard of care.   Author: Amaryllis Dare, MD 09/15/2024 2:45 PM  For on call review [email or web address].  "

## 2024-09-15 NOTE — Progress Notes (Signed)
 OT Cancellation Note  Patient Details Name: KENNIDEE HEYNE MRN: [id number] DOB: 1935/03/30   Cancelled Treatment:    Reason Eval/Treat Not Completed: Medical issues which prohibited therapy. Pt pending further imaging, OT to follow up as able and medically appropriate, per MD okay to hold until imaging is complete.   Jasmarie Coppock L. Vernisha Bacote, OTR/L  09/15/2024, 3:20 PM

## 2024-09-15 NOTE — TOC Initial Note (Signed)
 Transition of Care [facility]) - Initial/Assessment Note    Patient Details  Name: Kim Santiago MRN: [id number] Date of Birth: Jul 11, 1935  Transition of Care [facility]) CM/SW Contact:    Nathanael CHRISTELLA Ring, RN Phone Number: 09/15/2024, 12:35 PM  Clinical Narrative:                 Patient admitted to the hospital with hemorrhagic stroke, CM met with patient and her son in law and her granddaughter in the room and her daughter was on the phone.  Introduced self and explained role in DC planning.  They have decided that they do not want to be comfort care at this time but would like to pursue STR back at [facility].  CM will complete the workup.  Once Medically stable we will get insurance authorization for STR.   Expected Discharge Plan: Skilled Nursing Facility Barriers to Discharge: Continued Medical Work up   Patient Goals and CMS Choice Patient states their goals for this hospitalization and ongoing recovery are:: To go back to [facility] for rehab CMS Medicare.gov Compare Post Acute Care list provided to:: Patient Choice offered to / list presented to : Patient, Adult Children      Expected Discharge Plan and Services   Discharge Planning Services: CM Consult Post Acute Care Choice: Skilled Nursing Facility                   DME Arranged: N/A         HH Arranged: NA          Prior Living Arrangements/Services     Patient language and need for interpreter reviewed:: Yes Do you feel safe going back to the place where you live?: Yes      Need for Family Participation in Patient Care: Yes (Comment) Care giver support system in place?: Yes (comment)   Criminal Activity/Legal Involvement Pertinent to Current Situation/Hospitalization: No - Comment as needed  Activities of Daily Living   ADL Screening (condition at time of admission) Independently performs ADLs?: No Does the patient have a NEW difficulty with bathing/dressing/toileting/self-feeding that is expected to last >3  days?: No Does the patient have a NEW difficulty with getting in/out of bed, walking, or climbing stairs that is expected to last >3 days?: No Does the patient have a NEW difficulty with communication that is expected to last >3 days?: No Is the patient deaf or have difficulty hearing?: Yes Does the patient have difficulty seeing, even when wearing glasses/contacts?: No Does the patient have difficulty concentrating, remembering, or making decisions?: No  Permission Sought/Granted Permission sought to share information with : Facility Medical Sales Representative, Family Supports Permission granted to share information with : Yes, Verbal Permission Granted  Share Information with NAME: [name]  Permission granted to share info w AGENCY: [facility]  Permission granted to share info w Relationship: daughter  Permission granted to share info w Contact Information: [phone]  Emotional Assessment Appearance:: Appears stated age Attitude/Demeanor/Rapport: Engaged Affect (typically observed): Accepting Orientation: : Oriented to Self, Oriented to Place, Oriented to Situation Alcohol  / Substance Use: Not Applicable Psych Involvement: No (comment)  Admission diagnosis:  Hemorrhagic stroke (HCC) [I61.9] Thalamic hemorrhage (HCC) [I61.0] Left-sided weakness [R53.1] Patient Active Problem List   Diagnosis Date Noted   Hemorrhagic stroke (HCC) 09/14/2024   Closed L4 vertebral fracture (HCC) 09/06/2024   Thrombocytopenia 08/29/2024   Depression 08/29/2024   Sepsis (HCC) 08/29/2024   Influenza A with pneumonia 08/28/2024   Hyperkalemia due to angiotensin converting  enzyme inhibitor (ACE-I) 07/02/2024   Radiculitis, cervical 06/06/2024   Drug-induced systemic lupus erythematosus, unspecified organ involvement status 06/06/2024   Stage 3a chronic kidney disease (HCC) 06/06/2024   Gastritis 09/11/2023   Chronic low back pain with bilateral sciatica 09/11/2023   Gait instability 04/21/2022    Iron  deficiency anemia 12/23/2021   Frequent epistaxis 12/23/2021   Chronic diastolic heart failure (HCC) 06/19/2021   COVID-19 virus infection 04/20/2021   Generalized weakness 11/23/2020   Vaccine reaction, initial encounter 08/20/2020   Hemiplegia affecting dominant side, post-stroke (HCC) 01/16/2020   Does use hearing aid 07/14/2019   Impaired balance as late effect of cerebrovascular accident 11/13/2018   History of CVA with residual deficit 11/13/2018   Leukopenia 11/11/2018   Anxiety about health 07/05/2018   Mediastinal adenopathy 04/27/2018   Edema due to hypoalbuminemia 02/20/2018   Hospital discharge follow-up 02/20/2018   History of vertebral fracture 02/18/2018   GERD (gastroesophageal reflux disease) 10/31/2017   Hand joint pain 06/13/2017   Vaginal atrophy 05/11/2017   Venous insufficiency of both lower extremities 01/10/2017   Hyponatremia 10/07/2016   Subacute cutaneous lupus erythematosus 06/26/2016   Anemia of chronic disease 05/24/2016   Recurrent UTI 04/29/2016   TMJ syndrome 03/31/2016   Hearing loss 03/12/2016   Osteopenia 12/05/2015   Hx of thyroid  cancer 06/12/2014   Adenomatous polyp 06/12/2014   Grief 04/08/2014   Sjogren's syndrome 03/09/2014   Insomnia due to stress 03/06/2014   Hyperlipidemia 12/28/2012   Medicare annual wellness visit, subsequent 12/28/2012   Essential hypertension 10/02/2011   Iatrogenic hypothyroidism 10/02/2011   Purpura 05/25/2011   PCP:  Marylynn Verneita CROME, MD Pharmacy:   [facility] [address], [place] - [address] [place] [address] Phone: [phone] Fax: [phone]     Social Drivers of Health (SDOH) Social History: SDOH Screenings   Food Insecurity: No Food Insecurity (09/14/2024)  Housing: Unknown (09/14/2024)  Transportation Needs: No Transportation Needs (09/14/2024)  Utilities: Not At Risk (09/14/2024)  Alcohol  Screen: Low Risk (04/18/2024)  Depression (PHQ2-9): High Risk (09/01/2024)   Financial Resource Strain: Low Risk (04/18/2024)  Physical Activity: Insufficiently Active (04/18/2024)  Social Connections: Moderately Isolated (09/14/2024)  Stress: No Stress Concern Present (04/18/2024)  Tobacco Use: Low Risk (09/14/2024)  Health Literacy: Adequate Health Literacy (04/18/2024)   SDOH Interventions:     Readmission Risk Interventions     No data to display

## 2024-09-15 NOTE — Plan of Care (Signed)

## 2024-09-15 NOTE — Assessment & Plan Note (Addendum)
 Patient was found to have acute right thalamic hemorrhagic stroke with mild edema or midline shift on initial imaging.  Further workup was not done initially as family wants full comfort care which was revoked this morning.  Mentation is at baseline with improving left-sided deficit.  -Repeat CT head ordered to check for stability as family wants to stay conservative and does not want full stroke workup. -Not a candidate for any antiplatelet or blood thinner at this time. - Repeat imaging to see the stability  -PT/OT evaluation-if repeat CT is stable

## 2024-09-15 NOTE — Assessment & Plan Note (Signed)
 Patient was recently sent to rehab when she was found to have a lumbar compression fracture. No acute concern -PT/OT evaluation -Continue with pain management

## 2024-09-15 NOTE — Assessment & Plan Note (Signed)
 History of lupus. - Patient was on weekly methotrexate  and low-dose prednisone  at home -Continuing prednisone  -Will resume weekly methotrexate  on discharge

## 2024-09-15 NOTE — Hospital Course (Addendum)
 Partly taken from prior notes.  Kim Santiago is a 89 y.o. female with medical history significant of HTN, hypothyroidism, HLD, Sjgren's syndrome, SLE, GERD, anxiety/depression, sent from nursing home for evaluation of left-sided weakness.  Patient was recently hospitalized for influenza A pneumonia complicated with CAP and discharged to nursing home 2 days ago.   On presentation vital stable, CT head with hemorrhagic stroke on right side of thalamic region with no significant mass effect or midline shift.  Initially PCCM and neurology was consulted, later family decided to proceed with comfort measures only.  Palliative care was consulted.  1/9: Vital stable, patient with significant improvement in alertness and left-sided weakness.  Family now revoked full comfort care status and would like some workup including PT and OT evaluation with the hope that she can go back to rehab.  They do not want any MRI or CTA or lab workup. Discussed with neurology and a repeat CT head was ordered and if the bleed seems stable she can be evaluated with PT and OT. They do not want any surgical intervention and patient will remain DNR at this time.  1/10: Vital stable, repeat CT head with slightly decreased right thalamic hemorrhage, stable edema and no midline shift.  No new foci of hemorrhage.  PT is recommending SNF, patient likely will go back on Monday after getting another insurance authorization.  1/11: Remained hemodynamically stable, slowly improving deficit.  Awaiting going back to SNF.  1/13: Remained hemodynamically stable.  No new deficit.  Insurance authorization was obtained for CIR today.  No bed availability.  They will let us  know tomorrow.  1/14.  Acute inpatient rehab accepted patient today.

## 2024-09-16 DIAGNOSIS — I10 Essential (primary) hypertension: Secondary | ICD-10-CM | POA: Diagnosis not present

## 2024-09-16 DIAGNOSIS — Z66 Do not resuscitate: Secondary | ICD-10-CM | POA: Diagnosis not present

## 2024-09-16 DIAGNOSIS — I619 Nontraumatic intracerebral hemorrhage, unspecified: Secondary | ICD-10-CM | POA: Diagnosis not present

## 2024-09-16 DIAGNOSIS — R531 Weakness: Secondary | ICD-10-CM | POA: Diagnosis not present

## 2024-09-16 DIAGNOSIS — E039 Hypothyroidism, unspecified: Secondary | ICD-10-CM | POA: Diagnosis not present

## 2024-09-16 DIAGNOSIS — S32000S Wedge compression fracture of unspecified lumbar vertebra, sequela: Secondary | ICD-10-CM | POA: Diagnosis not present

## 2024-09-16 DIAGNOSIS — E785 Hyperlipidemia, unspecified: Secondary | ICD-10-CM | POA: Diagnosis not present

## 2024-09-16 DIAGNOSIS — M3500 Sicca syndrome, unspecified: Secondary | ICD-10-CM | POA: Diagnosis not present

## 2024-09-16 DIAGNOSIS — I610 Nontraumatic intracerebral hemorrhage in hemisphere, subcortical: Secondary | ICD-10-CM | POA: Diagnosis not present

## 2024-09-16 DIAGNOSIS — Z515 Encounter for palliative care: Secondary | ICD-10-CM

## 2024-09-16 NOTE — Evaluation (Signed)
 Occupational Therapy Evaluation Patient Details Name: Kim Santiago MRN: [id number] DOB: 1935/03/17 Today's Date: 09/16/2024   History of Present Illness   Pt admitted to [facility] on [facility]/14[facility]24 for c/o stroke like symptoms including: L sided weakness. Imaging significant for: R thalamic hemorrhagic infarct. Recent hospital admission for fluA PNA and L4 compression fx. with DC to short term rehab. Significant PMH includes: medical history significant of HTN, hypothyroidism, HLD, Sjgren's syndrome, SLE, GERD, anxiety/depression.     Clinical Impressions Pt in bed upon OT arrival with SIL present during co-eval.  Pt pleasant and cooperative throughout eval, and verbalizes eagerness to work towards walking again.  Pt presents with fairly equal BUE strength, with mild LUE apraxia, severe LUE ataxia, and poor sitting and standing balance d/t posterior and L sided lean, all contributing to functional decline with ADLs and functional mobility.  2 person mod A to step pivot from bed to chair using RW.  Pt denied pain during eval, but reports some discomfort in L shoulder with elevation and some occasional back pain d/t recent fx.  Pt does have LSO brace for comfort, but did not require this for EOB sitting or transfer to recliner.  Pt required pillows propped beneath L arm in sitting d/t strong L lateral lean in recliner.  Pt and family appear motivated for additional rehab services.  Pt will continue to benefit from skilled OT to maximize indep with ADLs and functional mobility, while reducing physical burden on family and maximizing QOL.      If plan is discharge home, recommend the following:   A lot of help with walking and/or transfers;A lot of help with bathing/dressing/bathroom;Help with stairs or ramp for entrance;Assistance with cooking/housework;Two people to help with walking and/or transfers     Functional Status Assessment   Patient has had a recent decline in their functional status and  demonstrates the ability to make significant improvements in function in a reasonable and predictable amount of time.     Equipment Recommendations   Other (comment) (defer to next venue of care)     Recommendations for Other Services         Precautions/Restrictions   Precautions Precautions: Fall Recall of Precautions/Restrictions: Intact Required Braces or Orthoses: Spinal Brace Spinal Brace: Other (comment) (originally TLSO, now LSO d/t TLSO abrasive to skin) Spinal Brace Comments: for comfort for L4 fx Restrictions Weight Bearing Restrictions Per Provider Order: No     Mobility Bed Mobility Overal bed mobility: Needs Assistance Bed Mobility: Supine to Sit     Supine to sit: Mod assist     General bed mobility comments: modA for trunk facilitation to sit EOB, HOB semi-elevated, use of BUE for support, increased time/effort. modA for anterior scooting towards EOB via chuck pad; increased assist required for balance due to heavy L posterolateral lean. Patient Response: Cooperative  Transfers Overall transfer level: Needs assistance Equipment used: Rolling walker (2 wheels) Transfers: Sit to/from Stand Sit to Stand: Mod assist                  Balance Overall balance assessment: Needs assistance Sitting-balance support: Feet supported, Bilateral upper extremity supported Sitting balance-Leahy Scale: Poor Sitting balance - Comments: heavy L posterolateral lean in sitting; able to correct with minA, but can not maintain balance for static or dynamic sitting despite BUE support. Postural control: Posterior lean, Left lateral lean Standing balance support: Bilateral upper extremity supported, During functional activity, Reliant on assistive device for balance Standing balance-Leahy Scale: Poor Standing  balance comment: modA for standing balance in RW                           ADL either performed or assessed with clinical judgement   ADL  Overall ADL's : Needs assistance/impaired Eating/Feeding: Set up Eating/Feeding Details (indicate cue type and reason): positioning required d/t strong L sided lean                 Lower Body Dressing: Moderate assistance Lower Body Dressing Details (indicate cue type and reason): able to demo figure 4 position for attempt to don socks, but required OT assist to don d/t pt requiring mod A to maintain sitting balance EOB Toilet Transfer: Moderate assistance;Rolling walker (2 wheels) Toilet Transfer Details (indicate cue type and reason): simulated to bedside chair.         Functional mobility during ADLs: Moderate assistance;Rolling walker (2 wheels) General ADL Comments: mod A for step pivot bed>recliner d/t LUE/LLE incoordination and L sided lean.  OT assist for set up of L hand on top of walker handle.     Vision Patient Visual Report: No change from baseline                Praxis Praxis: Impaired Praxis Impairment Details: Motor planning Praxis-Other Comments: LUE: mild apraxia noted in the hand observed during digit opposition.  Able to complete with extra time.  Noted severe gross motor ataxia, demonstrated with poor control during finger to nose.   Pertinent Vitals/Pain Pain Assessment Pain Assessment: No/denies pain     Extremity/Trunk Assessment Upper Extremity Assessment Upper Extremity Assessment: Overall WFL for tasks assessed   Lower Extremity Assessment Lower Extremity Assessment: Defer to PT evaluation   Cervical / Trunk Assessment Cervical / Trunk Assessment: Kyphotic   Communication Communication Communication: Impaired Factors Affecting Communication: Hearing impaired   Cognition Arousal: Alert Behavior During Therapy: WFL for tasks assessed/performed Cognition: No apparent impairments                               Following commands: Intact       Cueing  General Comments   Cueing Techniques: Verbal cues  purpura on  chest   Exercises           Home Living Family/patient expects to be discharged to:: Private residence Living Arrangements: Children Available Help at Discharge: Family;Available PRN/intermittently Type of Home: House Home Access: Level entry     Home Layout: Two level;Able to live on main level with bedroom/bathroom     Bathroom Shower/Tub: Producer, Television/film/video: Standard     Home Equipment: Patent Examiner (4 wheels);BSC/3in1;Grab bars - tub/shower;Rolling Walker (2 wheels);Shower seat - built in   Additional Comments: Pt has been living with daughter and SIL, though per chart, family has been concerned about pt's decline over the last year.      Prior Functioning/Environment Prior Level of Function : Needs assist             Mobility Comments: mod I with rollator, fall prior to admission. ADLs Comments: admit from [facility]; needing assist for ADL's and IADL's. normally mod I at baseline with PRN care from daughter for IADL's    OT Problem List: Decreased knowledge of use of DME or AE;Decreased coordination;Decreased knowledge of precautions;Decreased activity tolerance;Impaired UE functional use;Impaired balance (sitting and/or standing);Decreased safety awareness;Pain   OT Treatment/Interventions: Self-care/ADL training;Therapeutic exercise;Patient/family  education;Balance training;Therapeutic activities;Energy conservation;DME and/or AE instruction;Neuromuscular education      OT Goals(Current goals can be found in the care plan section)   Acute Rehab OT Goals Patient Stated Goal: to ambulate OT Goal Formulation: With patient/family Time For Goal Achievement: 09/30/24 Potential to Achieve Goals: Good ADL Goals Pt Will Perform Lower Body Dressing: with min assist Pt Will Transfer to Toilet: with min assist;bedside commode Pt Will Perform Toileting - Clothing Manipulation and hygiene: with min assist   OT Frequency:  Min 3X/week     Co-evaluation   Reason for Co-Treatment: Complexity of the patient's impairments (multi-system involvement);To address functional/ADL transfers;For patient/therapist safety PT goals addressed during session: Mobility/safety with mobility OT goals addressed during session: ADL's and self-care      AM-PAC OT 6 Clicks Daily Activity     Outcome Measure Help from another person eating meals?: A Little Help from another person taking care of personal grooming?: A Little Help from another person toileting, which includes using toliet, bedpan, or urinal?: A Lot Help from another person bathing (including washing, rinsing, drying)?: A Lot Help from another person to put on and taking off regular upper body clothing?: A Lot Help from another person to put on and taking off regular lower body clothing?: A Lot 6 Click Score: 14   End of Session Equipment Utilized During Treatment: Rolling walker (2 wheels) Nurse Communication: Mobility status  Activity Tolerance: Patient tolerated treatment well Patient left: with family/visitor present;in chair;with chair alarm set  OT Visit Diagnosis: Other abnormalities of gait and mobility (R26.89);Muscle weakness (generalized) (M62.81);Ataxia, unspecified (R27.0);Apraxia (R48.2)                Time: 0940-1006 OT Time Calculation (min): 26 min Charges:  OT General Charges $OT Visit: 1 Visit OT Evaluation $OT Eval Moderate Complexity: 1 Mod  Inocente Blazing, MS, OTR/L  Inocente MARLA Blazing 09/16/2024, 12:10 PM

## 2024-09-16 NOTE — Evaluation (Signed)
 Clinical/Bedside Swallow Evaluation Patient Details  Name: Kim Santiago MRN: [id number] Date of Birth: 1934-11-13  Today's Date: 09/16/2024 Time: SLP Start Time (ACUTE ONLY): 0805 SLP Stop Time (ACUTE ONLY): 0930 SLP Time Calculation (min) (ACUTE ONLY): 85 min  Past Medical History:  Past Medical History:  Diagnosis Date   Arthritis    Breast cancer (HCC) 2017   left mastectomy done 11/2015   Breast cancer in female [facility]) 11/18/2015   Left: 3.9 cm tumor, T2, 1/2 sentinel nodes positive for macro metastatic disease, N1, 3 negative nodes in the axillary tail, ER+,PR+, Her 2 neu, low Mammoprint score   Cancer (HCC)    thyroid  takes levothyroxine    CAP (community acquired pneumonia) 05/05/2021   Chronic kidney disease    UTI   Genetic screening 11/2015   Mammoprint of left breast cancer: Low risk for recurrence.    History of heart attack 06/12/2014   Hypertension    hypothyroidism    secondary to thyroidectomy for thyroid  ca   Hypothyroidism    Lupus    subcutaneous   Menopause 40s   natural, hot flashes and mood lability now gone, off prempro 7 months   Myocardial infarction [facility]) 2013   Osteoporosis    Osteopenia   Rosacea    Sjoegren syndrome    Stress-induced cardiomyopathy September of 2013   EF 35%. Peak troponin was 1.8.   Stroke [facility]) 10/2018   Past Surgical History:  Past Surgical History:  Procedure Laterality Date   BACK SURGERY     BREAST BIOPSY Left 10/30/15   positive, done in Dr. Fredirick office   CARDIAC CATHETERIZATION  05/2012   [facility]. No significant CAD. Ejection fraction of 35% due to stress-induced cardiomyopathy.   CHOLECYSTECTOMY     COLONOSCOPY     DILATION AND CURETTAGE OF UTERUS     KYPHOSIS SURGERY  Feb 2008   L1, Dr. Kathi   LUMBAR DISC SURGERY     L4-L5   MASTECTOMY Left 11/18/2015   positive   SENTINEL NODE BIOPSY Left 11/18/2015   Procedure: SENTINEL NODE BIOPSY;  Surgeon: Reyes LELON Cota, MD;  Location: [facility] ORS;  Service:  General;  Laterality: Left;   SHOULDER ARTHROSCOPY  2004   Left, Dr. Maryl   SIMPLE MASTECTOMY WITH AXILLARY SENTINEL NODE BIOPSY Left 11/18/2015   Procedure: SIMPLE MASTECTOMY;  Surgeon: Reyes LELON Cota, MD;  Location: [facility] ORS;  Service: General;  Laterality: Left;   SPINE SURGERY     L4-5 diskectomy   THYROIDECTOMY     Thyroid  Cancer   TONSILLECTOMY     TUBAL LIGATION     HPI:  Pt is a 89 y.o. female with medical history significant of HTN, hypothyroidism, HLD, Sjgren's syndrome, SLE, GERD, anxiety/depression, sent from nursing home for evaluation of possible stroke.     Patient was recently hospitalized 2x in Dec. 2025 for influenza A pneumonia complicated with CAP and a Fall w/ compression fracture discharged to nursing home for Rehab.  This morning, patient suddenly developed left-sided weakness around 10:30 AM, and patient sent to ED for further evaluation.  Patient denied any headache vision problems no nauseous vomiting.   ED Course: Afebrile, nontachycardic blood pressure 120/40.  CT head showed hemorrhagic stroke on the right side of thalamic region with no significant mass effect or midline shift.  After discussion with intensivist and neurology, family decided to start patient on comfort measure.  Pt began improving in overall status; repeat MRI on 1/9 revealed: Right thalamic intraparenchymal  hemorrhage, slightly decreased in size  compared to the prior study.  2. Similar degree of surrounding edema without significant midline shift.  3. No new or enlarging foci of intracranial hemorrhage..   Per Palliative Care note: Family endorses she has had significant decline over the past several weeks to months.  Her functional status requires a rollator and assistance.  She was at home being cared for by her daughter until recently when her daughter was unable to provide the care she needed and patient was transferred to [facility] for STR rehab.  Family shares that since patient has shown  signs of such drastic improvement they would like to pursue short-term rehab.  However, they do not want to escalate care, they do not want aggressive measures, and do not want any further testing or interventions other than what would be needed for her to be able to transfer back to [facility] for rehab.    Assessment / Plan / Recommendation  Clinical Impression   Pt was seen for BSE and informal assessment of Cognitive-communication abilities at bedside this morning. Son in law present for pt's support.  Pt awake/alert x3, verbal and engaged appropriately w/ this SLP; Son in law in room also for support. Pt helped to feed self given setup support- noted LUE weakness. Pt exhibited slight-min L oral weakness/decreased tone but speech intelligibility was [facility] until she fatigued.  On RA, afebrile. WBC WNL.  Per BSE, pt appears to present w/ mild oral phase dysphagia and functional pharyngeal phase swallowing w/ No overt clinical s/s of aspiration noted during oral intake at this assessment. Sensorimotor oral phase deficits noted c/b decreased awareness in L oral cavity impacting oral clearing. Any oral phase can increase risk for pharyngeal phase dysphagia.  Pt consumed po trials w/ no overt, clinical s/s of aspiration during po trials. Pt appears at reduced risk for aspiration/aspiration pneumonia when following general aspiration precautions and w/ support of Staff for tray setup and prep; slightly modified/less tough foods(for cutting/chewing) could be helpful also d/t pt's challenges using her LUE(weakness post CVA). Supporting choices in pt's diet and food prep/schedule could be beneficial to maintain interest in oral intake- Family present stated she did not eat much at a time PTA.  During po trials, pt consumed all consistencies w/ no overt coughing, decline in vocal quality, or change in respiratory presentation during/post trials. O2 sats remained in upper 90s when checked. Oral phase appeared [facility] w/  timely bolus management and control of bolus propulsion for A-P transfer for swallowing w/ thin liquids and purees. W/ increased textured foods requiring mastication and Time, L buccal/lingual residue was noted which pt exhibited decreased awareness/sensation of. Strategies were given to aid oral clearing: lingual and finger sweep; alternating foods and liquids. Pt utilized strategies appropriate and oral clearing achieved w/ all trial consistencies.  OM Exam revealed L oral(lingual/labial) decreased Tone/strength; CN 5, 7, 12. Speech and vocal quality WFL when alert and w/ Stamina. Pt fed self w/ FULL setup support d/t LUE weakness.   Recommend continue a fairly Regular consistency diet w/ well-Cut meats, moistened foods; Thin liquids -- carefully monitor any straw use, and pt should Hold Cup when drinking. Recommend general aspiration precautions including Small single bites/sips Slowly and sitting Upright for all oral intake. Pt should take po's only when FULLY awake/alert. Tray setup and sitting up support for meals/oral intake. Reduce distractions during meals. Oral Strategies: Lingual/finger sweeping; alternate foods/liquids.  Pills WHOLE in Puree for safer, easier swallowing -- it was  encouraged now and for D/C to the Family.   Education given on Pills in Puree; food consistencies and easy to eat options; tray Prep; general aspiration precautions and oral clearing strategies to pt and Son in social worker. Handouts given. ST services will monitor peripherally while admitted- recommend f/u at next venue of care for ongoing Education and needs.  NSG updated, agreed. MD updated. Recommend Dietician f/u for support. Precautions posted in room, chart.    Pt was also seen for informal cognitive-communication assessment at bedside using informal means and discussion/tasks of ADLs during her breakfast meal this morning. Family present- see above. Pt appears to present w/ Functional cognitive-communication abilities  per this assessment at bedside.   Pt exhibits relative strengths in conversational and informal speech including discussion of her current situation, family, independence w/ ADLs, and wants/needs surrounding the meal. Pt appropriately followed one-step directions to participate in OM exam and to position herself upright in bed, then to tray prep. Pt oriented to self, month, year, state, and city. Pt exhibited a relative strength in responding appropriately and accurately to basic yes/no and open-ended questions. Pt also able to answer problem-solving questions re: current situation/needs in room.    Pt demonstrated functional problem solving in ADLs during the meal but needed min more cues as she appeared to Fatigue w/in ~30 mins. Pt appears significantly impacted by the Fatigue Factor as noted by increased Dysarthria as well. Note OM Exam above in BSE indicating Left oral lingual/labial weakness and decreased tone. Regarding speech and Dysarthria, noted pt's perceptual speech characteristics appeared consistent w/ Ataxic Dysarthria c/b inconsistent speech characteristics including imprecise articulation and vocal loudness. Again, pt's imprecise articulation was not as present initially upon entering room but worsened slightly as she Fatigued w/ the meal -- ALSO NOTED HER EYES CLOSING BY THE END OF THE MEAL DURING CONVERSATION. Overall intelligibility was 90-100% during the session. Pt and Family educated on Dysarthria compensatory strategies w/ a focus on increasing loudness if intelligibility is reduced, AND REST PERIODS DURING THE DAY. They stated understanding of this information/strategy.    Pt educated on cognitive-communication assessment above and compensatory strategies/recommendations. Pt/Family appreciative and agreed. Recommend continued ST therapy at next venue of care to target Dysarthria tx and to provide Education/compensatory strategies/practice for communication in ADLs. Recommend more  formal assessment of any cognitive deficits if noted in her ADLs during Rehab at next venue of care. Pt/Son in law and MD/NSG updated and agreed.  SLP Visit Diagnosis: Dysphagia, oral phase (R13.11);Cognitive communication deficit (R41.841) (suspect impact from new R CVA; advanced Age; deconditioning w/ recent illnesses)    Aspiration Risk  Mild aspiration risk;Risk for inadequate nutrition/hydration (reduced when following general precautions; support at meals)    Diet Recommendation   Thin;Age appropriate regular (mech soft meats for ease of eating at meals(poor cutting ability d/t LUE weakness); moistened foods for ease of chewing/clearing)  Medication Administration: Whole meds with puree (for safer swallowing)    Other Recommendations Recommended Consults:  (Dietician support) Oral Care Recommendations: Oral care BID;Oral care before and after PO;Patient independent with oral care;Staff/trained caregiver to provide oral care (support)     Swallow Evaluation Recommendations  See above   Assistance Recommended at Discharge  Intermittent   Functional Status Assessment Patient has had a recent decline in their functional status and demonstrates the ability to make significant improvements in function in a reasonable and predictable amount of time.  Frequency and Duration  (next venue)   (next venue)  Prognosis Prognosis for improved oropharyngeal function: Good Barriers to Reach Goals:  (suspect impact from new R CVA and LUE weakness; advanced Age; deconditioning w/ recent illnesses) Barriers/Prognosis Comment: suspect impact from new R CVA and LUE weakness; advanced Age; deconditioning w/ recent illnesses      Swallow Study   General Date of Onset: 09/14/24 HPI: Pt is a 89 y.o. female with medical history significant of HTN, hypothyroidism, HLD, Sjgren's syndrome, SLE, GERD, anxiety/depression, sent from nursing home for evaluation of possible stroke.     Patient was recently  hospitalized 2x in Dec. 2025 for influenza A pneumonia complicated with CAP and a Fall w/ compression fracture discharged to nursing home for Rehab.  This morning, patient suddenly developed left-sided weakness around 10:30 AM, and patient sent to ED for further evaluation.  Patient denied any headache vision problems no nauseous vomiting.   ED Course: Afebrile, nontachycardic blood pressure 120/40.  CT head showed hemorrhagic stroke on the right side of thalamic region with no significant mass effect or midline shift.  After discussion with intensivist and neurology, family decided to start patient on comfort measure.  Pt began improving in overall status; repeat MRI on 1/9 revealed: Right thalamic intraparenchymal hemorrhage, slightly decreased in size  compared to the prior study.  2. Similar degree of surrounding edema without significant midline shift.  3. No new or enlarging foci of intracranial hemorrhage..   Per Palliative Care note: Family endorses she has had significant decline over the past several weeks to months.  Her functional status requires a rollator and assistance.  She was at home being cared for by her daughter until recently when her daughter was unable to provide the care she needed and patient was transferred to [facility] for STR rehab.  Family shares that since patient has shown signs of such drastic improvement they would like to pursue short-term rehab.  However, they do not want to escalate care, they do not want aggressive measures, and do not want any further testing or interventions other than what would be needed for her to be able to transfer back to [facility] for rehab. Type of Study: Bedside Swallow Evaluation (and Cognitive-communiation assessment at Bedside) Previous Swallow Assessment: none Diet Prior to this Study: Regular;Thin liquids (Level 0) Temperature Spikes Noted: No (wbc 3.5) Respiratory Status: Room air History of Recent Intubation: No Behavior/Cognition:  Alert;Pleasant mood;Cooperative;Distractible;Requires cueing (appeared to Fatigue easily; min decreased awareness in immediate environment at times during breakfast meal) Oral Cavity Assessment: Within Functional Limits Oral Care Completed by SLP: Recent completion by staff Oral Cavity - Dentition: Adequate natural dentition Vision: Functional for self-feeding Self-Feeding Abilities: Able to feed self;Needs assist;Needs set up (weak LUE) Patient Positioning: Upright in bed (needed support for upright positioning) Baseline Vocal Quality: Normal Volitional Cough: Strong Volitional Swallow: Able to elicit    Oral/Motor/Sensory Function Overall Oral Motor/Sensory Function: Mild impairment Facial ROM: Reduced left;Suspected CN VII (facial) dysfunction Facial Symmetry: Abnormal symmetry left;Suspected CN VII (facial) dysfunction Facial Strength: Reduced left;Suspected CN VII (facial) dysfunction Facial Sensation: Reduced left;Suspected CN V (Trigeminal) dysfunction Lingual ROM: Within Functional Limits Lingual Symmetry: Within Functional Limits Lingual Strength: Reduced;Suspected CN XII (hypoglossal) dysfunction (slightly) Lingual Sensation: Reduced;Suspected CN VII (facial) dysfunction-anterior 2/3 tongue (slight) Velum: Within Functional Limits Mandible: Within Functional Limits   Ice Chips Ice chips: Not tested   Thin Liquid Thin Liquid: Within functional limits Presentation: Cup;Self Fed (15+ trials during meal)    Nectar Thick Nectar Thick Liquid: Not tested   Honey Thick  Honey Thick Liquid: Not tested   Puree Puree: Within functional limits Presentation: Self Fed;Spoon (9+ trials)   Solid     Solid: Impaired Presentation: Spoon;Self Fed (10+ trials) Oral Phase Impairments: Poor awareness of bolus (min+ oral residue along L sulci/tongue/teeth) Oral Phase Functional Implications: Left lateral sulci pocketing;Oral residue (Pause in mastication as she fatigued during meal) Pharyngeal  Phase Impairments:  (no overt clinical s/s)        Comer Portugal, MS, CCC-SLP Speech Language Pathologist Rehab Services; [facility] [phone] (ascom)  Mckaela Howley 09/16/2024,1:46 PM

## 2024-09-16 NOTE — Assessment & Plan Note (Signed)
 Blood pressure currently within goal. -Goal blood pressure at 120 due to hemorrhagic stroke. - Continue home losartan  -Holding home metoprolol  as heart rate was mostly in 50s

## 2024-09-16 NOTE — Assessment & Plan Note (Signed)
 Patient was found to have acute right thalamic hemorrhagic stroke with mild edema or midline shift on initial imaging.  Mentation is at baseline with improving left-sided deficit.  -Repeat CT head seems stable and no new bleed or worsening edema. - family wants to stay conservative and does not want full stroke workup. -Not a candidate for any antiplatelet or blood thinner at this time.  -PT/OT evaluation-recommending CIR and will go today.

## 2024-09-16 NOTE — Evaluation (Signed)
 Physical Therapy Evaluation Patient Details Name: Kim Santiago MRN: [id number] DOB: [birth year] Today's Date: 09/16/2024  History of Present Illness  Pt admitted to [facility] on 09/14/24 for c/o stroke like symptoms including: L sided weakness. Imaging significant for: R thalamic hemorrhagic infarct. Recent hospital admission for fluA PNA and L4 compression fx. with DC to short term rehab. Significant PMH includes: medical history significant of HTN, hypothyroidism, HLD, Sjgren's syndrome, SLE, GERD, anxiety/depression.   Clinical Impression  Pt received in supine with family at bedside and is agreeable for PT eval. At baseline, pt is normally mod I for ADL's, ambulation with AD, and medication management; family assists with IADL's. Since DC to short-term rehab, she has required assist for all functional mobility and ADL's.   Pt presents with mild LLE weakness, impaired coordination, decreased activity tolerance, and decreased gross balance, resulting in impaired functional mobility from baseline. Due to deficits, pt required modA for bed mobility, modA for transfers with RW, and modA to ambulate 50ft EOB>recliner with RW. Pt requiring increased physical assist and cueing at this time secondary to heavy L posterolateral lean, demonstrating difficulty with self correction.   Deficits limit the pt's ability to safely and independently perform ADL's, transfer, and ambulate. Pt will benefit from acute skilled PT services to address deficits for return to baseline function. Pt will benefit from post acute therapy services to address deficits for return to baseline function.         If plan is discharge home, recommend the following: Assist for transportation;Help with stairs or ramp for entrance;A lot of help with walking and/or transfers;Assistance with cooking/housework;A lot of help with bathing/dressing/bathroom   Can travel by private vehicle   No    Equipment Recommendations  (defer to post  acute)     Functional Status Assessment Patient has had a recent decline in their functional status and demonstrates the ability to make significant improvements in function in a reasonable and predictable amount of time.     Precautions / Restrictions Precautions Precautions: Fall Recall of Precautions/Restrictions: Intact Required Braces or Orthoses: Spinal Brace Spinal Brace: Thoracolumbosacral orthotic;Other (comment) (LSO due to TLSO causing bruising and skin breakdown for L4 fx) Spinal Brace Comments: for comfort Restrictions Weight Bearing Restrictions Per Provider Order: No      Mobility  Bed Mobility         Supine to sit: Mod assist     General bed mobility comments: modA for trunk facilitation to sit EOB, HOB semi-elevated, use of BUE for support, increased time/effort. modA for anterior scooting towards EOB via chuck pad; increased assist required for balance due to heavy L posterolateral lean.    Transfers   Equipment used: Rolling walker (2 wheels)   Sit to Stand: Mod assist           General transfer comment: modA for power to stand from EOB with RW and for controlled descent to sit in recliner with RW. multimodal cues for safety, sequencing, hand placement, and RW proximity. demo's poor eccentric lowering with proper hand placement secondary to heavy L posterolateral lean    Ambulation/Gait   Gait Distance (Feet): 3 Feet           General Gait Details: modA to ambulate short distance EOB>recliner with RW. demo's heavy L posterolateral lean, decreased step length/foot clearance bil, fast cadence, and decreased RW proximity/negotiation. multimodal cues for safety, sequencing, and RW management.     Balance Overall balance assessment: Needs assistance Sitting-balance support: Feet supported, Bilateral  upper extremity supported Sitting balance-Leahy Scale: Poor Sitting balance - Comments: heavy L posterolateral lean in sitting; able to correct  with CGA-minA and heavy cueing for balance correction. improved balance with BUE for support.     Standing balance-Leahy Scale: Poor Standing balance comment: modA for standing balance in RW                             Pertinent Vitals/Pain Pain Assessment Pain Assessment: No/denies pain    Home Living Family/patient expects to be discharged to:: Private residence Living Arrangements: Children Available Help at Discharge: Family;Available PRN/intermittently (pt home alone for a couple hours here & there) Type of Home: House Home Access: Level entry       Home Layout: Two level;Able to live on main level with bedroom/bathroom Home Equipment: Toilet riser;Rollator (4 wheels);BSC/3in1;Grab bars - tub/shower;Rolling Walker (2 wheels);Shower seat - built in (could get WC if needed)      Prior Function               Mobility Comments: mod I with rollator, fall prior to admission. ADLs Comments: admit from [facility]; needing assist for ADL's and IADL's. normally mod I at baseline with PRN care from daughter for IADL's     Extremity/Trunk Assessment   Upper Extremity Assessment Upper Extremity Assessment: Defer to OT evaluation    Lower Extremity Assessment Lower Extremity Assessment: Overall WFL for tasks assessed (RLE grossly 4+/5; LLE grossly 4/5. mild impairments in alternating toe tap coordination; sensation intact L2-5 bil)    Cervical / Trunk Assessment Cervical / Trunk Assessment: Kyphotic  Communication   Communication Communication: Impaired Factors Affecting Communication: Hearing impaired    Cognition Arousal: Alert Behavior During Therapy: WFL for tasks assessed/performed   PT - Cognitive impairments: No apparent impairments                         Following commands: Intact       Cueing Cueing Techniques: Verbal cues     General Comments General comments (skin integrity, edema, etc.): purpura on chest; otherwise skin  intact    Exercises Other Exercises Other Exercises: Pt and family edu re: PT role/POC, DC recommendations, current level of mobility and realistic needs at DC, neuroplasticity, safety with functional mobility, OOB to chair for meals and for toileting, call for help. they verbalized understanding.   Assessment/Plan    PT Assessment Patient needs continued PT services  PT Problem List Decreased strength;Decreased activity tolerance;Decreased mobility;Decreased balance       PT Treatment Interventions DME instruction;Therapeutic exercise;Gait training;Balance training;Stair training;Neuromuscular re-education;Functional mobility training;Patient/family education;Therapeutic activities    PT Goals (Current goals can be found in the Care Plan section)  Acute Rehab PT Goals Patient Stated Goal: to get better PT Goal Formulation: With patient Time For Goal Achievement: 10/07/24 Potential to Achieve Goals: Good    Frequency Min 3X/week     Co-evaluation PT/OT/SLP Co-Evaluation/Treatment: Yes Reason for Co-Treatment: Complexity of the patient's impairments (multi-system involvement);To address functional/ADL transfers;For patient/therapist safety PT goals addressed during session: Mobility/safety with mobility OT goals addressed during session: ADL's and self-care       AM-PAC PT 6 Clicks Mobility  Outcome Measure Help needed turning from your back to your side while in a flat bed without using bedrails?: A Lot Help needed moving from lying on your back to sitting on the side of a flat bed without using bedrails?: A  Lot Help needed moving to and from a bed to a chair (including a wheelchair)?: A Lot Help needed standing up from a chair using your arms (e.g., wheelchair or bedside chair)?: A Lot Help needed to walk in hospital room?: A Lot Help needed climbing 3-5 steps with a railing? : Total 6 Click Score: 11    End of Session Equipment Utilized During Treatment: Gait  belt Activity Tolerance: Patient tolerated treatment well Patient left: in chair;with call bell/phone within reach;with chair alarm set;with family/visitor present Nurse Communication: Mobility status PT Visit Diagnosis: Difficulty in walking, not elsewhere classified (R26.2);Other abnormalities of gait and mobility (R26.89);Muscle weakness (generalized) (M62.81)    Time: 0940-1006 PT Time Calculation (min) (ACUTE ONLY): 26 min   Charges:   PT Evaluation $PT Eval Moderate Complexity: 1 Mod PT Treatments $Therapeutic Activity: 8-22 mins PT General Charges $$ ACUTE PT VISIT: 1 Visit        Camie CHARLENA Kluver, PT, DPT 11:08 AM,09/16/2024 Physical Therapist - [facility]

## 2024-09-16 NOTE — TOC Progression Note (Signed)
 Transition of Care [facility]) - Progression Note    Patient Details  Name: Kim Santiago MRN: [id number] Date of Birth: 11-20-1934  Transition of Care [facility]) CM/SW Contact  Natalyia Innes L Jarae Panas, [place] Phone Number: 09/16/2024, 2:02 PM  Clinical Narrative:     Shara started with [facility] for SNF and EMS. Response expected by Monday the latest.   Expected Discharge Plan: Skilled Nursing Facility Barriers to Discharge: Continued Medical Work up               Expected Discharge Plan and Services   Discharge Planning Services: CM Consult Post Acute Care Choice: Skilled Nursing Facility                   DME Arranged: N/A         HH Arranged: NA           Social Drivers of Health (SDOH) Interventions SDOH Screenings   Food Insecurity: No Food Insecurity (09/14/2024)  Housing: Unknown (09/14/2024)  Transportation Needs: No Transportation Needs (09/14/2024)  Utilities: Not At Risk (09/14/2024)  Alcohol  Screen: Low Risk (04/18/2024)  Depression (PHQ2-9): High Risk (09/01/2024)  Financial Resource Strain: Low Risk (04/18/2024)  Physical Activity: Insufficiently Active (04/18/2024)  Social Connections: Moderately Isolated (09/14/2024)  Stress: No Stress Concern Present (04/18/2024)  Tobacco Use: Low Risk (09/14/2024)  Health Literacy: Adequate Health Literacy (04/18/2024)    Readmission Risk Interventions     No data to display

## 2024-09-16 NOTE — Plan of Care (Signed)

## 2024-09-16 NOTE — Progress Notes (Signed)

## 2024-09-16 NOTE — Progress Notes (Addendum)
 "                                                                                                                                                                                               Palliative Care Progress Note, Assessment & Plan   Patient Name: Kim Santiago       Date: 09/16/2024 DOB: 1935-01-18  Age: 89 y.o. MRN#: [id number] Attending Physician: Caleen Qualia, MD Primary Care Physician: Marylynn Verneita CROME, MD Admit Date: 09/14/2024  Subjective: Patient endorses continued weakness today. Complains of L shouder pain and chronic back pain. States she was able to get up with PT and take steps to recliner this morning. Reports eating 50% of breakfast today. Last BM was yesterday. She denies CP/SOB.   HPI: Per previous HPI: 89 y.o. female  with past medical history of hypothyroidism, GERD, anxiety, depression, HTN, Sjogren syndrome, HLD, SLE with purpura admitted on 09/14/2024 with chief complaint of left-sided weakness.   On presentation to ED, CT of head revealed hemorrhagic stroke to right thalamic region with no significant mass effect or midline shift.   Initially, PCCM and neurology were consulted.  After discussions with these services, family decided to shift to comfort focused care.   PMT was consulted to assist with goals of care conversations.   Summary of counseling/coordination of care: Chart reviewed:   Labs: No new labs since 09/14/24    Latest Ref Rng & Units 09/14/2024   11:38 AM 09/10/2024    4:48 AM 09/06/2024    6:26 AM  CBC  WBC 4.0 - 10.5 K/uL 3.5  2.5  3.5   Hemoglobin 12.0 - 15.0 g/dL 9.5  8.8  8.8   Hematocrit 36.0 - 46.0 % 29.3  25.8  25.9   Platelets 150 - 400 K/uL 213  187  195       Latest Ref Rng & Units 09/14/2024   11:38 AM 09/12/2024    4:32 AM 09/10/2024    4:48 AM  CMP  Glucose 70 - 99 mg/dL 871  88  92   BUN 8 - 23 mg/dL 14  13  14    Creatinine 0.44 - 1.00 mg/dL 9.29  9.34  9.36   Sodium 135 - 145 mmol/L 130  132  131   Potassium 3.5 - 5.1 mmol/L  4.3  4.4  4.3   Chloride 98 - 111 mmol/L 95  96  96   CO2 22 - 32 mmol/L 25  28  28    Calcium  8.9 - 10.3 mg/dL 8.9  8.5  8.6   Total Protein 6.5 - 8.1 g/dL 6.5  Total Bilirubin 0.0 - 1.2 mg/dL 0.7     Alkaline Phos 38 - 126 U/L 84     AST 15 - 41 U/L 18     ALT 0 - 44 U/L <5       Vitals: Blood pressure (!) 111/54, pulse 71, temperature 97.9 F (36.6 C), resp. rate 16, height 5' 3 (1.6 m), weight 58.9 kg, SpO2 97%.  Progress notes: Reviewed notes from ED staff, TRH, nursing, SLP,  PT/OT, TOC and Chaplain  Imaging: CT head dated 09/15/24 1. Right thalamic intraparenchymal hemorrhage, slightly decreased in size compared to the prior study. 2. Similar degree of surrounding edema without significant midline shift. 3. No new or enlarging foci of intracranial hemorrhage.  MAR: No changes   ACP documents: Reviewed Living Will, DNR and HPOA documentation on file.   After reviewing the patient's chart and assessing the patient at bedside, I spoke with patient in regards to symptom management and goals of care.   Very pleasant, elderly female sitting upright in recliner with family friend at bedside. She is alert and oriented to self, time, location and situation. She is able to participate in conversation making her wishes known. Moves upper extremities at will. Respirations are even and unlabored. She is in no distress.  Therapeutic silence and active listening provided for patient to share her thoughts and emotions regarding current medical situation.  Emotional support provided.  Physical Exam Vitals reviewed.  Constitutional:      General: She is not in acute distress.    Appearance: She is not ill-appearing.     Comments: Thin, frail  HENT:     Head: Normocephalic and atraumatic.     Mouth/Throat:     Mouth: Mucous membranes are moist.  Pulmonary:     Effort: Pulmonary effort is normal. No respiratory distress.  Musculoskeletal:     Right lower leg: No edema.     Left  lower leg: No edema.  Skin:    General: Skin is warm and dry.  Neurological:     Mental Status: She is alert and oriented to person, place, and time.     Motor: Weakness present.  Psychiatric:        Mood and Affect: Mood normal.        Behavior: Behavior normal.        Thought Content: Thought content normal.        Judgment: Judgment normal.    Recommendations/Plan: DNR/DNI Continue current supportive interventions Plan for patient to transfer to SNF per TOC note when medically stable  Palliative will continue to follow for GOC conversations    I personally spent a total of 50 minutes in the care of the patient today including preparing to see the patient, getting/reviewing separately obtained history, performing a medically appropriate exam/evaluation, documenting clinical information in the EHR, and independently interpreting results  Addendum 1644: Returned call to [place], daughter, who expresses concern about patients disposition. She shares that she was told her mother would d/c back to [facility] on Monday, but PT/OT had recommended other location with more intense PT/OT. Shared progress note from Inpatient Rehab Coordinator that patient was a possible candidate for CIR. Bari is appreciative of call and will wait for case management to reach out regarding acceptance into CIR or other rehab.    Devere Sacks, AMANDA [facility] Palliative Medicine Team  09/16/2024 9:13 AM  Office [phone]  Pager [phone]     "

## 2024-09-16 NOTE — Progress Notes (Signed)
 " Progress Note   Patient: Kim Santiago FMW:[id number] DOB: [birth year] DOA: 09/14/2024     2 DOS: the patient was seen and examined on 09/16/2024   Brief hospital course: Partly taken from prior notes.  Kim Santiago is a 89 y.o. female with medical history significant of HTN, hypothyroidism, HLD, Sjgren's syndrome, SLE, GERD, anxiety/depression, sent from nursing home for evaluation of left-sided weakness.  Patient was recently hospitalized for influenza A pneumonia complicated with CAP and discharged to nursing home 2 days ago.   On presentation vital stable, CT head with hemorrhagic stroke on right side of thalamic region with no significant mass effect or midline shift.  Initially PCCM and neurology was consulted, later family decided to proceed with comfort measures only.  Palliative care was consulted.  1/9: Vital stable, patient with significant improvement in alertness and left-sided weakness.  Family now revoked full comfort care status and would like some workup including PT and OT evaluation with the hope that she can go back to rehab.  They do not want any MRI or CTA or lab workup. Discussed with neurology and a repeat CT head was ordered and if the bleed seems stable she can be evaluated with PT and OT. They do not want any surgical intervention and patient will remain DNR at this time.  1/10: Vital stable, repeat CT head with slightly decreased right thalamic hemorrhage, stable edema and no midline shift.  No new foci of hemorrhage.  PT is recommending SNF, patient likely will go back on Monday after getting another insurance authorization.  Assessment and Plan: * Hemorrhagic stroke [facility]) Patient was found to have acute right thalamic hemorrhagic stroke with mild edema or midline shift on initial imaging.  Further workup was not done initially as family wants full comfort care which was revoked this morning.  Mentation is at baseline with improving left-sided  deficit.  -Repeat CT head seems stable and no new bleed or worsening edema. - family wants to stay conservative and does not want full stroke workup. -Not a candidate for any antiplatelet or blood thinner at this time.  -PT/OT evaluation-recommending SNF  Hypertension Blood pressure currently within goal. -Goal blood pressure at 120 due to hemorrhagic stroke. - Continue home losartan  -Holding home metoprolol  as heart rate was mostly in 50s  Sjogren's syndrome History of lupus. - Patient was on weekly methotrexate  and low-dose prednisone  at home -Continuing prednisone  -Will resume weekly methotrexate  on discharge  Hypothyroidism - Continue home Synthroid   Dyslipidemia - Continue home Crestor  Lumbar compression fracture (HCC) Patient was recently sent to rehab when she was found to have a lumbar compression fracture. No acute concern -PT/OT evaluation -Continue with pain management   Subjective: Patient was sitting in chair when seen today.  No new deficit, left upper extremity weakness improving.  Physical Exam: Vitals:   09/15/24 2342 09/16/24 0400 09/16/24 0800 09/16/24 1144  BP: (!) 114/48 (!) 121/55 (!) 114/53 (!) 111/54  Pulse: 68 67 63 71  Resp: 17 20 18 16   Temp: 98.7 F (37.1 C) 98 F (36.7 C) 98.5 F (36.9 C) 97.9 F (36.6 C)  TempSrc:  Axillary Axillary   SpO2: 98% 99% 99% 97%  Weight:      Height:       General.  Frail elderly lady, in no acute distress.  Stable ecchymosis Pulmonary.  Lungs clear bilaterally, normal respiratory effort. CV.  Regular rate and rhythm, no JVD, rub or murmur. Abdomen.  Soft, nontender, nondistended, BS  positive. CNS.  Alert and oriented .  No new focal neurologic deficit. Extremities.  No edema,  pulses intact and symmetrical. Psychiatry.  Judgment and insight appears normal.   Data Reviewed: Prior data reviewed  Family Communication: Talked with daughter on phone.  Disposition: Status is: Inpatient Remains  inpatient appropriate because: Severity of illness  Planned Discharge Destination: Skilled nursing facility  DVT prophylaxis.  SCDs Time spent: 50 minutes  This record has been created using Conservation officer, historic buildings. Errors have been sought and corrected,but may not always be located. Such creation errors do not reflect on the standard of care.   Author: Amaryllis Dare, MD 09/16/2024 3:19 PM  For on call review [email or web address].  "

## 2024-09-17 DIAGNOSIS — E039 Hypothyroidism, unspecified: Secondary | ICD-10-CM | POA: Diagnosis not present

## 2024-09-17 DIAGNOSIS — I610 Nontraumatic intracerebral hemorrhage in hemisphere, subcortical: Secondary | ICD-10-CM | POA: Diagnosis not present

## 2024-09-17 DIAGNOSIS — Z515 Encounter for palliative care: Secondary | ICD-10-CM | POA: Diagnosis not present

## 2024-09-17 DIAGNOSIS — E785 Hyperlipidemia, unspecified: Secondary | ICD-10-CM | POA: Diagnosis not present

## 2024-09-17 DIAGNOSIS — I619 Nontraumatic intracerebral hemorrhage, unspecified: Secondary | ICD-10-CM | POA: Diagnosis not present

## 2024-09-17 DIAGNOSIS — Z66 Do not resuscitate: Secondary | ICD-10-CM | POA: Diagnosis not present

## 2024-09-17 DIAGNOSIS — I10 Essential (primary) hypertension: Secondary | ICD-10-CM | POA: Diagnosis not present

## 2024-09-17 DIAGNOSIS — S32000S Wedge compression fracture of unspecified lumbar vertebra, sequela: Secondary | ICD-10-CM | POA: Diagnosis not present

## 2024-09-17 DIAGNOSIS — M3500 Sicca syndrome, unspecified: Secondary | ICD-10-CM | POA: Diagnosis not present

## 2024-09-17 NOTE — Plan of Care (Signed)

## 2024-09-17 NOTE — Progress Notes (Signed)
 " Progress Note   Patient: Kim Santiago FMW:[id number] DOB: 1934/10/30 DOA: 09/14/2024     3 DOS: the patient was seen and examined on 09/17/2024   Brief hospital course: Partly taken from prior notes.  SHANEEN REESER is a 89 y.o. female with medical history significant of HTN, hypothyroidism, HLD, Sjgren's syndrome, SLE, GERD, anxiety/depression, sent from nursing home for evaluation of left-sided weakness.  Patient was recently hospitalized for influenza A pneumonia complicated with CAP and discharged to nursing home 2 days ago.   On presentation vital stable, CT head with hemorrhagic stroke on right side of thalamic region with no significant mass effect or midline shift.  Initially PCCM and neurology was consulted, later family decided to proceed with comfort measures only.  Palliative care was consulted.  1/9: Vital stable, patient with significant improvement in alertness and left-sided weakness.  Family now revoked full comfort care status and would like some workup including PT and OT evaluation with the hope that she can go back to rehab.  They do not want any MRI or CTA or lab workup. Discussed with neurology and a repeat CT head was ordered and if the bleed seems stable she can be evaluated with PT and OT. They do not want any surgical intervention and patient will remain DNR at this time.  1/10: Vital stable, repeat CT head with slightly decreased right thalamic hemorrhage, stable edema and no midline shift.  No new foci of hemorrhage.  PT is recommending SNF, patient likely will go back on Monday after getting another insurance authorization.  1/11: Remained hemodynamically stable, slowly improving deficit.  Awaiting going back to SNF.  Assessment and Plan: * Hemorrhagic stroke [facility]) Patient was found to have acute right thalamic hemorrhagic stroke with mild edema or midline shift on initial imaging.  Further workup was not done initially as family wants full comfort  care which was revoked this morning.  Mentation is at baseline with improving left-sided deficit.  -Repeat CT head seems stable and no new bleed or worsening edema. - family wants to stay conservative and does not want full stroke workup. -Not a candidate for any antiplatelet or blood thinner at this time.  -PT/OT evaluation-recommending SNF  Hypertension Blood pressure currently within goal. -Goal blood pressure at 120 due to hemorrhagic stroke. - Continue home losartan  -Holding home metoprolol  as heart rate was mostly in 50s  Sjogren's syndrome History of lupus. - Patient was on weekly methotrexate  and low-dose prednisone  at home -Continuing prednisone  -Will resume weekly methotrexate  on discharge  Hypothyroidism - Continue home Synthroid   Dyslipidemia - Continue home Crestor  Lumbar compression fracture (HCC) Patient was recently sent to rehab when she was found to have a lumbar compression fracture. No acute concern -PT/OT evaluation -Continue with pain management   Subjective: Patient was seen and examined today.  She had some headache overnight which has been now improved.  No new deficit, stating left-sided weakness continue to improve.  Daughter at bedside.  Physical Exam: Vitals:   09/16/24 1938 09/17/24 0347 09/17/24 0737 09/17/24 1143  BP: (!) 107/50 123/61 (!) 130/56 (!) 106/51  Pulse: 67 68 66 65  Resp: 18 18 16 16   Temp: 97.9 F (36.6 C) 98.2 F (36.8 C) 98.5 F (36.9 C) 97.9 F (36.6 C)  TempSrc: Oral Oral Oral Oral  SpO2: 97% 100% 99% 98%  Weight:      Height:       General.  Frail and malnourished elderly lady, in no acute  distress.  Multiple scattered ecchymosis Pulmonary.  Lungs clear bilaterally, normal respiratory effort. CV.  Regular rate and rhythm, no JVD, rub or murmur. Abdomen.  Soft, nontender, nondistended, BS positive. CNS.  Alert and oriented .  No focal neurologic deficit. Extremities.  No edema,  pulses intact and  symmetrical. Psychiatry.  Judgment and insight appears normal.   Data Reviewed: Prior data reviewed  Family Communication: Discussed with daughter at bedside  Disposition: Status is: Inpatient Remains inpatient appropriate because: Severity of illness  Planned Discharge Destination: Skilled nursing facility  DVT prophylaxis.  SCDs Time spent: 45 minutes  This record has been created using Conservation officer, historic buildings. Errors have been sought and corrected,but may not always be located. Such creation errors do not reflect on the standard of care.   Author: Amaryllis Dare, MD 09/17/2024 3:45 PM  For on call review [email or web address].  "

## 2024-09-17 NOTE — Progress Notes (Addendum)
 "                                                                                                                                                                                               Palliative Care Progress Note, Assessment & Plan   Patient Name: Kim Santiago       Date: 09/17/2024 DOB: 1935/05/17  Age: 89 y.o. MRN#: [id number] Attending Physician: Caleen Qualia, MD Primary Care Physician: Marylynn Verneita CROME, MD Admit Date: 09/14/2024  Subjective: Denies pain. Reports HA, shoulder pain and back pain from earlier relieved with Tylenol . Ate most of her breakfast. Unsure about last BM. Daughter will ask RN for prn laxative. Denies CP/SOB.   HPI: Per previous HPI: 89 y.o. female  with past medical history of hypothyroidism, GERD, anxiety, depression, HTN, Sjogren syndrome, HLD, SLE with purpura admitted on 09/14/2024 with chief complaint of left-sided weakness.   On presentation to ED, CT of head revealed hemorrhagic stroke to right thalamic region with no significant mass effect or midline shift.   Initially, PCCM and neurology were consulted.  After discussions with these services, family decided to shift to comfort focused care.   PMT was consulted to assist with goals of care conversations.   Summary of counseling/coordination of care Chart reviewed:   Labs: No new labs since 1/8  Vitals: Blood pressure (!) 106/51, pulse 65, temperature 97.9 F (36.6 C), temperature source Oral, resp. rate 16, height 5' 3 (1.6 m), weight 58.9 kg, SpO2 98%.   Progress notes: Reviewed progress notes from [facility], PMT, SLP, TOC, PT/OT and nursing staff  Imaging: No new imaging since 1/9  MAR: No changes  ACP documents: Reviewed advanced directives and DNR on file   After reviewing the patient's chart and assessing the patient at bedside, I spoke with patient in regards to symptom management and goals of care.   Ill-appearing, very pleasant, elderly female sleeping in bed with daughter at  bedside. She awakens to our conversation and answers questions appropriately. She is able to participate in conversation. Respirations are even and unlabored. She is in no distress.   Patients daughter we are currently waiting on decision for acceptance to CIR. Advised TOC will most likely update her tomorrow. Patient and daughter are awaiting decision for rehab location.   Therapeutic silence and active listening provided for patient and daughter to share their thoughts and emotions regarding current medical situation.  Emotional support provided.  Physical Exam Vitals reviewed.  Constitutional:      General: She is not in acute distress.    Appearance: She is ill-appearing.  Comments: Thin, frail   HENT:     Head: Normocephalic and atraumatic.     Mouth/Throat:     Mouth: Mucous membranes are dry.  Pulmonary:     Effort: Pulmonary effort is normal. No respiratory distress.  Abdominal:     General: There is no distension.     Palpations: Abdomen is soft.     Tenderness: There is no abdominal tenderness. There is no guarding.  Skin:    General: Skin is warm and dry.     Comments: Large area of ecchymosis to upper chest  Neurological:     Mental Status: She is alert and oriented to person, place, and time.     Motor: Weakness present.     Comments: Able to move upper extremities at will w/o difficulty  Psychiatric:        Mood and Affect: Mood normal.        Behavior: Behavior normal.        Thought Content: Thought content normal.        Judgment: Judgment normal.    Recommendations/Plan: DNR/DNI Continue current supportive interventions Plan for patient to transfer to SNF per TOC note when medically stable  Palliative will continue to follow           I personally spent a total of 50 minutes in the care of the patient today including preparing to see the patient, getting/reviewing separately obtained history, performing a medically appropriate exam/evaluation, counseling  and educating, documenting clinical information in the EHR, independently interpreting results, and coordinating care.    Devere Sacks, ELNITA- [facility] Palliative Medicine Team  09/17/2024 8:20 AM  Office [phone]  Pager [phone]    "

## 2024-09-17 NOTE — Plan of Care (Signed)

## 2024-09-18 DIAGNOSIS — I619 Nontraumatic intracerebral hemorrhage, unspecified: Secondary | ICD-10-CM | POA: Diagnosis not present

## 2024-09-18 DIAGNOSIS — Z515 Encounter for palliative care: Secondary | ICD-10-CM | POA: Diagnosis not present

## 2024-09-18 DIAGNOSIS — E785 Hyperlipidemia, unspecified: Secondary | ICD-10-CM | POA: Diagnosis not present

## 2024-09-18 DIAGNOSIS — M3500 Sicca syndrome, unspecified: Secondary | ICD-10-CM | POA: Diagnosis not present

## 2024-09-18 DIAGNOSIS — Z66 Do not resuscitate: Secondary | ICD-10-CM | POA: Diagnosis not present

## 2024-09-18 MED ORDER — POLYETHYLENE GLYCOL 3350 17 G PO PACK
17.0000 g | PACK | Freq: Every day | ORAL | Status: DC
  Administered 2024-09-18 – 2024-09-20 (×2): 17 g via ORAL
  Filled 2024-09-18 (×3): qty 1

## 2024-09-18 MED ORDER — LEVOTHYROXINE SODIUM 50 MCG PO TABS
75.0000 ug | ORAL_TABLET | Freq: Every day | ORAL | Status: DC
  Administered 2024-09-18 – 2024-09-20 (×3): 75 ug via ORAL
  Filled 2024-09-18 (×3): qty 1

## 2024-09-18 NOTE — Plan of Care (Signed)

## 2024-09-18 NOTE — Progress Notes (Signed)
 " Progress Note   Patient: Kim Santiago FMW:[id number] DOB: April 27, 1935 DOA: 09/14/2024     4 DOS: the patient was seen and examined on 09/18/2024   Brief hospital course:  Partly taken from prior notes.   Kim Santiago is a 89 y.o. female with medical history significant of HTN, hypothyroidism, HLD, Sjgren's syndrome, SLE, GERD, anxiety/depression, sent from nursing home for evaluation of left-sided weakness.   Patient was recently hospitalized for influenza A pneumonia complicated with CAP and discharged to nursing home 2 days ago.    On presentation vital stable, CT head with hemorrhagic stroke on right side of thalamic region with no significant mass effect or midline shift.   Initially PCCM and neurology was consulted, later family decided to proceed with comfort measures only.   Palliative care was consulted.   1/9: Vital stable, patient with significant improvement in alertness and left-sided weakness.  Family now revoked full comfort care status and would like some workup including PT and OT evaluation with the hope that she can go back to rehab.  They do not want any MRI or CTA or lab workup. Discussed with neurology and a repeat CT head was ordered and if the bleed seems stable she can be evaluated with PT and OT. They do not want any surgical intervention and patient will remain DNR at this time.   1/10: Vital stable, repeat CT head with slightly decreased right thalamic hemorrhage, stable edema and no midline shift.  No new foci of hemorrhage.  PT is recommending SNF, patient likely will go back on Monday after getting another insurance authorization.  1/12: OT actually recommends CIR, not SNF. Looks like TOC wasn't aware of that. Has SNF bed but family says they are interested in CIR based on OT's strong recommendation for that. TOC to reach out to CIR.  Assessment and Plan: * Hemorrhagic stroke [facility]) Patient was found to have acute right thalamic hemorrhagic stroke with  mild edema or midline shift on initial imaging. Further workup was not done initially as family wants full comfort care which was revoked this morning. Mentation is at baseline with stable left-sided deficit. -Repeat CT head seems stable and no new bleed or worsening edema. - family wants to stay conservative and does not want full stroke workup. -Not a candidate for any antiplatelet or blood thinner at this time.  -PT/OT evaluation-recommending SNF, now OT advising CIR, family interested in that, so although patient has snf bed and auth, we are now pursuing CIR  Hypertension Blood pressure currently within goal. -Goal blood pressure at 120 due to hemorrhagic stroke. - Continue home losartan  -Holding home metoprolol  as heart rate was mostly in 50s  Sjogren's syndrome History of lupus. - Patient was on weekly methotrexate  and low-dose prednisone  at home -Continuing prednisone  -Will resume weekly methotrexate  on discharge  Hypothyroidism - has not been receiving home synthroid , will re-order  Dyslipidemia - Continue home Crestor  Lumbar compression fracture (HCC) Patient was recently sent to rehab when she was found to have a lumbar compression fracture. No acute concern -CIR vs SNF as above   Subjective: mild headache she says in line with prior adult headaches. No new neurologic symptoms. Tolerating diet.  Physical Exam: Vitals:   09/17/24 2000 09/18/24 0000 09/18/24 0525 09/18/24 0757  BP: (!) 116/56 (!) 139/59 115/64 (!) 123/55  Pulse: 67 70 71 72  Resp: 20 16 17 16   Temp: 98.4 F (36.9 C) 98.3 F (36.8 C) 98.1 F (36.7 C)  97.7 F (36.5 C)  TempSrc: Oral Oral Oral   SpO2: 98% 98% 97% 98%  Weight:      Height:       General.  Frail and malnourished elderly lady, in no acute distress.    Pulmonary.  Lungs clear bilaterally, normal respiratory effort. CV.  Regular rate and rhythm, no JVD, rub or murmur. Abdomen.  Soft, nontender, nondistended, BS positive. CNS.   Alert and oriented .  Left sided facial droop and mild left sided weakness Skin bruising anterior chest Extremities.  No edema,  pulses intact and symmetrical.  Psychiatry.  calm  Data Reviewed: Prior data reviewed  Family Communication: Discussed with daughter at bedside 1/12  Disposition: Status is: Inpatient Remains inpatient appropriate because: pending placement  Planned Discharge Destination: CIR vs SNF  DVT prophylaxis.  SCDs    Author: Devaughn KATHEE Ban, MD 09/18/2024 9:32 AM  For on call review [email or web address].  "

## 2024-09-18 NOTE — Progress Notes (Signed)
 Physical Therapy Treatment Patient Details Name: Kim Santiago MRN: [id number] DOB: 04-15-1935 Today's Date: 09/18/2024   History of Present Illness Pt admitted to [facility] on 09/14/24 for c/o stroke like symptoms including: L sided weakness. Imaging significant for: R thalamic hemorrhagic infarct. Recent hospital admission for fluA PNA and L4 compression fx. with DC to short term rehab. Significant PMH includes: medical history significant of HTN, hypothyroidism, HLD, Sjgren's syndrome, SLE, GERD, anxiety/depression.    PT Comments  Pt motivated and excited for therapy session.  Supportive daughter in room. Pt is given time to get to EOB on her own but struggles and eventually asks for help and reaches for my hand to pull up.  She struggles with sitting balance with strong L lean needing verbal and tactile cues to correct.  When positioned in midline she struggles to maintain and endorses I don't feel straight.  Reaching left/right/high/low inside and outside BOS.  She is able to cross midline left and right with ease but has trouble correcting posture at times needing physical cues to correct.  She is able to stand to RW with mod a x 1 and does better maintaining center in standing.  She is able to do hip/knee flexion L but when attempting R she is unable and L leg quickly buckles.  She sits back on bed but is able to do a stand pivot transfer to recliner.  Generally mod assist with initially more trouble gaining balance due to L lean but once up, she is able to shuffle feet to chair with good control and mod a x 1.  Assist to control descent onto bed.  She remains up in recliner with lunch arrived, daughter in room and safety on.  Discussed with RN and tech and recommended +2 assist with nursing for transfers and [facility] needs.    Discussed with daughter Bari who agrees that they would like to look at [facility] and are prepared to assist at home upon discharge.  Discharge recommendations are updated to  reflect.  Pt is able to tolerate extended sessions.  She put in max effort with a good attitude to participate I want to do it myself again.  She had minimal fatigue with session but wanted to eat lunch as it arrived once in the chair.       If plan is discharge home, recommend the following: Assist for transportation;Help with stairs or ramp for entrance;Assistance with cooking/housework;A lot of help with bathing/dressing/bathroom;Two people to help with walking and/or transfers;Two people to help with bathing/dressing/bathroom   Can travel by private vehicle        Equipment Recommendations       Recommendations for Other Services       Precautions / Restrictions Precautions Precautions: Fall Recall of Precautions/Restrictions: Intact Required Braces or Orthoses: Spinal Brace Spinal Brace: Other (comment) (originally TLSO, now LSO d/t TLSO abrasive to skin) Spinal Brace Comments: for comfort for L4 fx Restrictions Weight Bearing Restrictions Per Provider Order: No     Mobility  Bed Mobility Overal bed mobility: Needs Assistance Bed Mobility: Supine to Sit   Sidelying to sit: Min assist, Mod assist       General bed mobility comments: struggles on her own and does ultimetly ask for help and reaches for my hand to pull herself up.  L lean makes transition difficult Patient Response: Cooperative  Transfers Overall transfer level: Needs assistance Equipment used: Rolling walker (2 wheels) Transfers: Sit to/from Stand, Bed to chair/wheelchair/BSC Sit to Stand: Mod  assist Stand pivot transfers: Mod assist         General transfer comment: stood with walker but LLE buckles with attempt at steps so stand pivot transfer to recliner with improved control and less buckling    Ambulation/Gait               General Gait Details: unable to take true steps   Stairs             Wheelchair Mobility     Tilt Bed Tilt Bed Patient Response:  Cooperative  Modified Rankin (Stroke Patients Only)       Balance Overall balance assessment: Needs assistance Sitting-balance support: Feet supported, Bilateral upper extremity supported Sitting balance-Leahy Scale: Poor Sitting balance - Comments: leavy left lean and difficutly finding center.  more challenged in sitting than standing howerver Postural control: Left lateral lean Standing balance support: Bilateral upper extremity supported, During functional activity, Reliant on assistive device for balance Standing balance-Leahy Scale: Poor Standing balance comment: modA for standing balance in RW                            Communication Communication Communication: Impaired  Cognition Arousal: Alert Behavior During Therapy: WFL for tasks assessed/performed   PT - Cognitive impairments: No apparent impairments                         Following commands: Intact      Cueing Cueing Techniques: Verbal cues  Exercises Other Exercises Other Exercises: weight shifting left/right/high low in and outside of BOS.  difficulty correcting outside of BOS    General Comments        Pertinent Vitals/Pain Pain Assessment Pain Assessment: No/denies pain    Home Living                          Prior Function            PT Goals (current goals can now be found in the care plan section) Progress towards PT goals: Progressing toward goals    Frequency    Min 3X/week      PT Plan      Co-evaluation              AM-PAC PT 6 Clicks Mobility   Outcome Measure  Help needed turning from your back to your side while in a flat bed without using bedrails?: A Lot Help needed moving from lying on your back to sitting on the side of a flat bed without using bedrails?: A Lot Help needed moving to and from a bed to a chair (including a wheelchair)?: A Lot Help needed standing up from a chair using your arms (e.g., wheelchair or bedside  chair)?: A Lot Help needed to walk in hospital room?: Total Help needed climbing 3-5 steps with a railing? : Total 6 Click Score: 10    End of Session Equipment Utilized During Treatment: Gait belt Activity Tolerance: Patient tolerated treatment well Patient left: in chair;with call bell/phone within reach;with chair alarm set;with family/visitor present Nurse Communication: Mobility status PT Visit Diagnosis: Difficulty in walking, not elsewhere classified (R26.2);Other abnormalities of gait and mobility (R26.89);Muscle weakness (generalized) (M62.81)     Time: 8685-8659 PT Time Calculation (min) (ACUTE ONLY): 26 min  Charges:    $Therapeutic Activity: 23-37 mins PT General Charges $$ ACUTE PT VISIT: 1 Visit  Lauraine Gills, PTA 09/18/2024, 1:52 PM

## 2024-09-18 NOTE — Care Management Important Message (Signed)
 Important Message  Patient Details  Name: Kim Santiago MRN: [id number] Date of Birth: 14-Feb-1935   Important Message Given:  Yes - Medicare IM     Alfhild Partch W, CMA 09/18/2024, 3:16 PM

## 2024-09-18 NOTE — Progress Notes (Signed)
 Patient remains free from any noted signs of acute distress.  Has received prn pain medication to assist with adequate pain management.  Patient remains free from any noted signs of associated symptoms.  Patient to continue to be monitored by hospital staff.

## 2024-09-18 NOTE — Progress Notes (Signed)
 "                                                                                                                                                                                               Palliative Care Progress Note, Assessment & Plan   Patient Name: Kim Santiago       Date: 09/18/2024 DOB: 17-Apr-1935  Age: 89 y.o. MRN#: [id number] Attending Physician: Kandis Devaughn Sayres, MD Primary Care Physician: Marylynn Verneita CROME, MD Admit Date: 09/14/2024  Subjective: Patient reports not feeling well today with being more tired. Endorses feeling more weak today. Complains of headache relieved with Tylenol  this morning.  Ate approximately 50% of breakfast.  Denies shortness of breath or chest pain.  She reports mild weakness to left upper extremity when trying to move arm.  HPI: Per previous HPI: 89 y.o. female  with past medical history of hypothyroidism, GERD, anxiety, depression, HTN, Sjogren syndrome, HLD, SLE with purpura admitted on 09/14/2024 with chief complaint of left-sided weakness.   On presentation to ED, CT of head revealed hemorrhagic stroke to right thalamic region with no significant mass effect or midline shift.   Initially, PCCM and neurology were consulted.  After discussions with these services, family decided to shift to comfort focused care.   PMT was consulted to assist with goals of care conversations.   Summary of counseling/coordination of care Chart reviewed:   Labs: No new labs since 09/14/2024  Vitals: Blood pressure (!) 106/44, pulse 63, temperature 98.1 F (36.7 C), temperature source Oral, resp. rate 18, height 5' 3 (1.6 m), weight 58.9 kg, SpO2 97%.   Progress notes: Reviewed progress notes from [facility], PMT, SLP, TOC, PT/OT and nursing staff  Imaging: No new imaging since 1/9  MAR: No changes made  ACP documents: Reviewed advanced directives and DNR on file   After reviewing the patient's chart and assessing the patient at bedside, I spoke with patient in  regards to symptom management and goals of care.   Very pleasant, ill-appearing, elderly female lying in bed with daughter at bedside. She is alert and oriented. She is able to participate in conversation. She appears more tired today than in previous visits. Respirations are even and unlabored. She is in no distress.   Bari shares concern about where her mother will be discharged for rehab. She was advised earlier today that her mother was going back to [facility], but then received call from [facility] stating her mother was being considered for CIR. I advised that there would need to be approvals for CIR from facility coordinator and insurance. Bari is satisfied with this  explanation as shares she is okay if her mother is only approved for [facility] as long as she is able to have rehabilitation.    Therapeutic silence and active listening provided for patient and daughter to share their thoughts and emotions regarding current medical situation.  Emotional support provided.  Physical Exam Vitals reviewed.  Constitutional:      General: She is not in acute distress.    Appearance: She is ill-appearing.  HENT:     Head: Normocephalic and atraumatic.     Mouth/Throat:     Mouth: Mucous membranes are moist.  Pulmonary:     Effort: Pulmonary effort is normal. No respiratory distress.  Musculoskeletal:     Right lower leg: No edema.     Left lower leg: No edema.  Skin:    General: Skin is warm and dry.     Comments: Large area of ecchymosis to chest  Neurological:     Mental Status: She is alert and oriented to person, place, and time.     Motor: Weakness present.     Comments: Able to move upper extremities at will, w/o difficulty  Psychiatric:        Mood and Affect: Mood normal.        Behavior: Behavior normal.        Thought Content: Thought content normal.        Judgment: Judgment normal.             Recommendations/Plan: DNR/DNI Continue current supportive interventions Per TOC  note, pt being evaluated for CIR Palliative will continue to follow peripherally       I personally spent a total of 50 minutes in the care of the patient today including preparing to see the patient, getting/reviewing separately obtained history, performing a medically appropriate exam/evaluation, counseling and educating, documenting clinical information in the EHR, and independently interpreting results.    Devere Sacks, AMANDA [facility] Palliative Medicine Team  09/18/2024 9:44 AM  Office [phone]  Pager [phone]     "

## 2024-09-18 NOTE — Progress Notes (Addendum)
 Inpatient Rehab Admissions Coordinator:   Left a message for patient's daughter to discuss rehab recommendations.    1025: Spoke to pt's daughter on the phone regarding recommendations for CIR and goals/expectations of CIR stay.  We reviewed hospital level care, 3 hrs/day of therapy, and estimated length of stay 11-15 days with goals of 24/7 supervision.  Daughter confirms that between herself and her spouse, as well as other community members, pt will have expected level of care.  We reviewed need for insurance prior auth. Note that auth for SNF was received prior to CIR workup.  I've asked TOC to withdraw authorization request for SNF based on OT recommendations and my conversation with pt's family.  Will send to [facility] for prior auth.   Reche Lowers, PT, DPT Admissions Coordinator [phone] 09/18/2024 10:11 AM

## 2024-09-18 NOTE — Progress Notes (Signed)
 Occupational Therapy Treatment Patient Details Name: Kim Santiago MRN: [id number] DOB: 03-09-1935 Today's Date: 09/18/2024   History of present illness Pt admitted to [facility] on 09/14/24 for c/o stroke like symptoms including: L sided weakness. Imaging significant for: R thalamic hemorrhagic infarct. Recent hospital admission for fluA PNA and L4 compression fx. with DC to short term rehab. Significant PMH includes: medical history significant of HTN, hypothyroidism, HLD, Sjgren's syndrome, SLE, GERD, anxiety/depression.   OT comments  Patient seen for OT treatment on this date. Upon arrival to room patient sitting in recliner, agreeable to treatment. Focus of tx was on improving midline awareness and dynamic standing balance. Patient came into standing with mod A for lifting A; tolerated stance with r/w with significant L lateral lean with limited improvement in carryover.  OT set up in front of mirror for improvement in visual feedback with good return demo on maintaining midline even while crossing midline with LUE. Patient limited by fatigue and requested to sit down. Patient ended treatment in recliner with bed/chair alarm on and all needs within reach. Patient making good progress toward goals, will continue to follow POC. Discharge recommendation remains appropriate.        If plan is discharge home, recommend the following:  A lot of help with walking and/or transfers;A lot of help with bathing/dressing/bathroom;Help with stairs or ramp for entrance;Assistance with cooking/housework;Two people to help with walking and/or transfers   Equipment Recommendations  None recommended by OT    Recommendations for Other Services      Precautions / Restrictions Precautions Precautions: Fall Recall of Precautions/Restrictions: Intact Required Braces or Orthoses: Spinal Brace Spinal Brace: Other (comment) (LSO) Spinal Brace Comments: for comfort for L4 fx Restrictions Weight Bearing  Restrictions Per Provider Order: No       Mobility Bed Mobility               General bed mobility comments: OOB pre/post tx    Transfers Overall transfer level: Needs assistance Equipment used: Rolling walker (2 wheels) Transfers: Sit to/from Stand Sit to Stand: Mod assist           General transfer comment: cues for hand placement, tactile cues for LUE management     Balance Overall balance assessment: Needs assistance Sitting-balance support: Feet supported, Bilateral upper extremity supported Sitting balance-Leahy Scale: Poor Sitting balance - Comments: significant left lean and difficulty maintaining midline; visual feedback provided with improved insight/carryover Postural control: Left lateral lean Standing balance support: Bilateral upper extremity supported, During functional activity, Reliant on assistive device for balance Standing balance-Leahy Scale: Poor Standing balance comment: modA for standing balance in RW                           ADL either performed or assessed with clinical judgement   ADL Overall ADL's : Needs assistance/impaired                                       General ADL Comments: anticipate max A for ADLs due to significant balance deficits, LUE coordination impairment, L posteriolateral lean    Extremity/Trunk Assessment              Vision       Perception     Praxis     Communication Communication Communication: Impaired Factors Affecting Communication: Hearing impaired   Cognition Arousal: Alert Behavior  During Therapy: WFL for tasks assessed/performed                                 Following commands: Intact        Cueing   Cueing Techniques: Verbal cues  Exercises Other Exercises Other Exercises: crossing midline with LUE for visual target and for midline reintegration    Shoulder Instructions       General Comments      Pertinent Vitals/ Pain        Pain Assessment Pain Assessment: No/denies pain  Home Living                                          Prior Functioning/Environment              Frequency  Min 3X/week        Progress Toward Goals  OT Goals(current goals can now be found in the care plan section)  Progress towards OT goals: Progressing toward goals     Plan      Co-evaluation                 AM-PAC OT 6 Clicks Daily Activity     Outcome Measure   Help from another person eating meals?: A Little Help from another person taking care of personal grooming?: A Little Help from another person toileting, which includes using toliet, bedpan, or urinal?: A Lot Help from another person bathing (including washing, rinsing, drying)?: A Lot Help from another person to put on and taking off regular upper body clothing?: A Lot Help from another person to put on and taking off regular lower body clothing?: A Lot 6 Click Score: 14    End of Session Equipment Utilized During Treatment: Rolling walker (2 wheels)  OT Visit Diagnosis: Other abnormalities of gait and mobility (R26.89);Muscle weakness (generalized) (M62.81);Ataxia, unspecified (R27.0);Apraxia (R48.2)   Activity Tolerance Patient tolerated treatment well   Patient Left with family/visitor present;in chair;with chair alarm set   Nurse Communication Mobility status        Time: 8479-8451 OT Time Calculation (min): 28 min  Charges: OT General Charges $OT Visit: 1 Visit OT Treatments $Self Care/Home Management : 23-37 mins  Rogers Clause, OT/L MSOT, 09/18/2024

## 2024-09-18 NOTE — TOC Progression Note (Addendum)
 Transition of Care [facility]) - Progression Note    Patient Details  Name: Kim Santiago MRN: [id number] Date of Birth: 08/11/1935  [facility] CM/SW Contact  Kim JONETTA Hamilton, RN Phone Number: 09/18/2024, 10:31 AM  Clinical Narrative:     Per notification from Attending, family requesting CIR not [facility]. This CM spoke with Kim Santiago at [facility] and she advised prior authorizations can not be withdrawn once given and that CIR will request authorization and the provider will review. This CM notified CIR Admission Coordinator of above.   Kim Santiago at [facility] notified that patient will no longer be coming.   UPDATE 10:43am:  Received call from Kim Santiago at [facility] requesting additional information regarding patient not coming. Kim Santiago advised this CM that the patient's son (unsure of name) has already came to the facility this morning and put patient's belongings in her room.   This CM attempted to reach patient's daughter/POA Kim Santiago, lvmm requesting to return my call.   Kim Santiago returned my call, clarified that her spouse went to [facility] this morning to collect patient's belongings however, staff had began placing patient belongings back into room as they were under the impression patient would be returning. Daughter Kim Santiago verbalized they want to see through the CIR process and if patient is denied CIR by insurance, they would like patient to return to [facility].   Spoke with Kim Santiago at [facility] and explained the above, she verbalized understanding and cautioned that patient may not have a bed available as they can not hold a bed for a patient that isn't coming, this CM verbalized understanding and will pass this information along to [facility] as well.  Expected Discharge Plan: Skilled Nursing Facility Barriers to Discharge: Continued Medical Work up               Expected Discharge Plan and Services   Discharge Planning Services: CM Consult Post Acute Care Choice: Skilled Nursing  Facility                   DME Arranged: N/A         HH Arranged: NA           Social Drivers of Health (SDOH) Interventions SDOH Screenings   Food Insecurity: No Food Insecurity (09/14/2024)  Housing: Unknown (09/14/2024)  Transportation Needs: No Transportation Needs (09/14/2024)  Utilities: Not At Risk (09/14/2024)  Alcohol  Screen: Low Risk (04/18/2024)  Depression (PHQ2-9): High Risk (09/01/2024)  Financial Resource Strain: Low Risk (04/18/2024)  Physical Activity: Insufficiently Active (04/18/2024)  Social Connections: Moderately Isolated (09/14/2024)  Stress: No Stress Concern Present (04/18/2024)  Tobacco Use: Low Risk (09/14/2024)  Health Literacy: Adequate Health Literacy (04/18/2024)    Readmission Risk Interventions     No data to display

## 2024-09-19 ENCOUNTER — Encounter: Payer: Self-pay | Admitting: Internal Medicine

## 2024-09-19 DIAGNOSIS — I10 Essential (primary) hypertension: Secondary | ICD-10-CM | POA: Diagnosis not present

## 2024-09-19 DIAGNOSIS — M3500 Sicca syndrome, unspecified: Secondary | ICD-10-CM | POA: Diagnosis not present

## 2024-09-19 DIAGNOSIS — I619 Nontraumatic intracerebral hemorrhage, unspecified: Secondary | ICD-10-CM | POA: Diagnosis not present

## 2024-09-19 DIAGNOSIS — E039 Hypothyroidism, unspecified: Secondary | ICD-10-CM | POA: Diagnosis not present

## 2024-09-19 NOTE — Plan of Care (Signed)

## 2024-09-19 NOTE — Plan of Care (Signed)
   Problem: Activity: Goal: Risk for activity intolerance will decrease Outcome: Progressing   Problem: Nutrition: Goal: Adequate nutrition will be maintained Outcome: Progressing   Problem: Coping: Goal: Level of anxiety will decrease Outcome: Progressing

## 2024-09-19 NOTE — Plan of Care (Signed)
   Problem: Ischemic Stroke/TIA Tissue Perfusion: Goal: Complications of ischemic stroke/TIA will be minimized Outcome: Progressing

## 2024-09-19 NOTE — PMR Pre-admission (Signed)
 PMR Admission Coordinator Pre-Admission Assessment  Patient: Kim Santiago is an 89 y.o., female MRN: [id number] DOB: Nov 06, 1934 Height: 5' 3 (160 cm) Weight: 58.9 kg  Insurance Information HMO:     PPO: yes     PCP:      IPA:      80/20:      OTHER:  PRIMARY: [employer]      Policy#: [id number]      Subscriber: pt CM Name: Madelin      Phone#: [phone]     Fax#: epic access Pre-Cert#: [id number] auth for CIR from [name] with [facility] for admit 1/14 with next review date 1/20.  Updates due weekly and they have epic acces.         Employer:  Benefits:  Phone #: [phone]     Name: n/a Eff. Date: 09/07/24     Deduct: $0      Out of Pocket Max: $3700 (met $0      Life Max: n/a CIR: $275/day for days 1-5      SNF: 20 full days Outpatient:      Co-Pay: $20/visit Home Health: 100%      Co-Pay:  DME: 80%     Co-Pay: 20% Providers:  SECONDARY:       Policy#:      Phone#:   Artist:       Phone#:   The Best Boy for patients in Inpatient Rehabilitation Facilities with attached Privacy Act Statement-Health Care Records was provided and verbally reviewed with: Patient and Family  Emergency Contact Information Contact Information     Name Relation Home Work [place] (POA),Christy Daughter [phone]  [phone]   Con Selinda Silk [phone]  [phone]      Other Contacts   None on File     Current Medical History  Patient Admitting Diagnosis: CVA   History of Present Illness: Pt is a 89 y/o female with PMH of HTN, Sjogren's syndrome, SLE, anxiety/depression who was admitted to [facility] from short term rehab on 09/14/24 for stroke like symptoms.  Briefly, PTA pt had an admission in late December following a fall, was diagnosed with the flu and d/c'd home.  She represented with ongoing back pain (present since the fall) and was diagnosed with an L4 compression fracture.  She was at that time discharged to STR on 09/12/24 and she  developed L sided weakness on 09/14/24.  Labs notable for neutropenia, anemia, hyponatremia.  CT showed small thalamic intracranial hemorrhage and she was started on cleviprex  for tight BP control.  Repeat head CT showed slight decrease in size of bleed without midline shift.  Family did elect for conservative care of CVA without workup.  Pt will continue methotrexate  and prednisone  at discharge for her SLE.  Therapy evaluations completed and pt was recommended for CIR.  Patients medical and functional status supports admission to an inpatient rehabilitation program. Due to multifactorial deficits, continued progress will require a coordinated, team-based approach with physician oversight to achieve meaningful gains in mobility, ADL independence, and safety.   Complete NIHSS TOTAL: 1  Patient's medical record from [facility] has been reviewed by the rehabilitation admission coordinator and physician.  Past Medical History  Past Medical History:  Diagnosis Date   Arthritis    Breast cancer (HCC) 2017   left mastectomy done 11/2015   Breast cancer in female [facility]) 11/18/2015   Left: 3.9 cm tumor, T2, 1/2 sentinel nodes positive for macro metastatic disease, N1, 3 negative  nodes in the axillary tail, ER+,PR+, Her 2 neu, low Mammoprint score   Cancer (HCC)    thyroid  takes levothyroxine    CAP (community acquired pneumonia) 05/05/2021   Chronic kidney disease    UTI   Genetic screening 11/2015   Mammoprint of left breast cancer: Low risk for recurrence.    History of heart attack 06/12/2014   Hypertension    hypothyroidism    secondary to thyroidectomy for thyroid  ca   Hypothyroidism    Lupus    subcutaneous   Menopause 40s   natural, hot flashes and mood lability now gone, off prempro 7 months   Myocardial infarction [facility]) 2013   Osteoporosis    Osteopenia   Rosacea    Sjoegren syndrome    Stress-induced cardiomyopathy September of 2013   EF 35%. Peak troponin was 1.8.   Stroke [facility]) 10/2018     Has the patient had major surgery during 100 days prior to admission? No  Family History   family history includes COPD in her father; Cancer in her brother and father; Cancer (age of onset: 91) in her brother; Heart attack (age of onset: 96) in her brother; Heart disease in her brother and mother; Kidney disease in her mother and sister.  Current Medications Current Medications[1]  Patients Current Diet:  Diet Order             Diet regular Room service appropriate? Yes with Assist; Fluid consistency: Thin  Diet effective now                   Precautions / Restrictions Precautions Precautions: Fall Spinal Brace: Other (comment) (LSO) Spinal Brace Comments: for comfort for L4 fx Restrictions Weight Bearing Restrictions Per Provider Order: No   Has the patient had 2 or more falls or a fall with injury in the past year? Yes  Prior Activity Level Limited Community (1-2x/wk): mod I at baseline, assist for iADLS  Prior Functional Level Self Care: Did the patient need help bathing, dressing, using the toilet or eating? Independent  Indoor Mobility: Did the patient need assistance with walking from room to room (with or without device)? Independent  Stairs: Did the patient need assistance with internal or external stairs (with or without device)? Independent  Functional Cognition: Did the patient need help planning regular tasks such as shopping or remembering to take medications? Needed some help  Patient Information Are you of Hispanic, Latino/a,or Spanish origin?: A. No, not of Hispanic, Latino/a, or Spanish origin What is your race?: A. White Do you need or want an interpreter to communicate with a doctor or health care staff?: 0. No  Patient's Response To:  Health Literacy and Transportation Is the patient able to respond to health literacy and transportation needs?: Yes Health Literacy - How often do you need to have someone help you when you read  instructions, pamphlets, or other written material from your doctor or pharmacy?: Never In the past 12 months, has lack of transportation kept you from medical appointments or from getting medications?: No In the past 12 months, has lack of transportation kept you from meetings, work, or from getting things needed for daily living?: No  Journalist, Newspaper / Equipment Home Equipment: Toilet riser, Rollator (4 wheels), BSC/3in1, Grab bars - tub/shower, Agricultural Consultant (2 wheels), Shower seat - built in  Prior Device Use: Indicate devices/aids used by the patient prior to current illness, exacerbation or injury? Walker  Current Functional Level Cognition  Orientation Level: Oriented  X4    Extremity Assessment (includes Sensation/Coordination)  Upper Extremity Assessment: Overall WFL for tasks assessed  Lower Extremity Assessment: Defer to PT evaluation    ADLs  Overall ADL's : Needs assistance/impaired Eating/Feeding: Set up Eating/Feeding Details (indicate cue type and reason): positioning required d/t strong L sided lean Lower Body Dressing: Moderate assistance Lower Body Dressing Details (indicate cue type and reason): able to demo figure 4 position for attempt to don socks, but required OT assist to don d/t pt requiring mod A to maintain sitting balance EOB Toilet Transfer: Moderate assistance, Rolling walker (2 wheels) Toilet Transfer Details (indicate cue type and reason): simulated to bedside chair. Functional mobility during ADLs: Moderate assistance, Rolling walker (2 wheels) General ADL Comments: anticipate max A for ADLs due to significant balance deficits, LUE coordination impairment, L posteriolateral lean    Mobility  Overal bed mobility: Needs Assistance Bed Mobility: Supine to Sit Sidelying to sit: Min assist, Mod assist Supine to sit: Contact guard, HOB elevated, Used rails General bed mobility comments: much better on L side of bed compared to R yesterday.   encouragement but able to do on her own    Transfers  Overall transfer level: Needs assistance Equipment used: Rolling walker (2 wheels), 1 person hand held assist Transfers: Sit to/from Stand, Bed to chair/wheelchair/BSC Sit to Stand: Mod assist Bed to/from chair/wheelchair/BSC transfer type:: Stand pivot Stand pivot transfers: Mod assist General transfer comment: cues for hand placement, tactile cues for LUE management    Ambulation / Gait / Stairs / Wheelchair Mobility  Ambulation/Gait Ambulation/Gait assistance: Min assist, Mod assist, +2 physical assistance Gait Distance (Feet): 40 Feet Assistive device: 2 person hand held assist Gait Pattern/deviations: Decreased step length - right, Decreased step length - left, Decreased stride length, Decreased stance time - left General Gait Details: able to walk with mod a x 1 along rail then progress to mod a x 2 HHA with cues for proper step pattern, and standing upright due to forward L lean Gait velocity: decreased    Posture / Balance Dynamic Sitting Balance Sitting balance - Comments: significant improvement today and able to maintain upright with R handhold on rail and mod verbal cues to correct. Balance Overall balance assessment: Needs assistance Sitting-balance support: Feet supported, Bilateral upper extremity supported Sitting balance-Leahy Scale: Poor Sitting balance - Comments: significant improvement today and able to maintain upright with R handhold on rail and mod verbal cues to correct. Postural control: Left lateral lean Standing balance support: Bilateral upper extremity supported, During functional activity, Reliant on assistive device for balance Standing balance-Leahy Scale: Poor Standing balance comment: +1 for static, +2 for dynamic    Special considerations/life events  N/a   Previous Home Environment (from acute therapy documentation) Living Arrangements: Children Available Help at Discharge: Family, Available  PRN/intermittently Type of Home: House Care Facility [facility] Home Layout: Two level, Able to live on main level with bedroom/bathroom Home Access: Level entry Bathroom Shower/Tub: Health Visitor: Standard Home Care Services: No Additional Comments: Pt has been living with daughter and SIL, though per chart, family has been concerned about pt's decline over the last year.  Discharge Living Setting Plans for Discharge Living Setting: Lives with (comment) (daughter and SIL) Type of Home at Discharge: House Discharge Home Layout: Able to live on main level with bedroom/bathroom Discharge Home Access: Level entry Discharge Bathroom Shower/Tub: Walk-in shower Discharge Bathroom Toilet: Standard Discharge Bathroom Accessibility: Yes How Accessible: Accessible via wheelchair Does the  patient have any problems obtaining your medications?: No  Social/Family/Support Systems Anticipated Caregiver: daughter, Bari Anticipated Caregiver's Contact Information: [phone] Ability/Limitations of Caregiver: none stated Caregiver Availability: 24/7 Discharge Plan Discussed with Primary Caregiver: Yes Is Caregiver In Agreement with Plan?: Yes Does Caregiver/Family have Issues with Lodging/Transportation while Pt is in Rehab?: No  Goals Patient/Family Goal for Rehab: PT/OT/SLP supervision Expected length of stay: 14-16 days Additional Information: Discharge plan: will d/c home with daughter and SIL who can provide 24/7 assist Pt/Family Agrees to Admission and willing to participate: Yes Program Orientation Provided & Reviewed with Pt/Caregiver Including Roles  & Responsibilities: Yes  Decrease burden of Care through IP rehab admission: n/a  Possible need for SNF placement upon discharge: Not anticipated.  Plan for discharge home with daughter and SIL as prior to admit.  Family can provide 24/7 care.   Patient Condition: I have reviewed medical records from  [facility], spoken with [facility] team, and patient and daughter. I discussed via phone for inpatient rehabilitation assessment.  Patient will benefit from ongoing PT, OT, and SLP, can actively participate in 3 hours of therapy a day 5 days of the week, and can make measurable gains during the admission.  Patient will also benefit from the coordinated team approach during an Inpatient Acute Rehabilitation admission.  The patient will receive intensive therapy as well as Rehabilitation physician, nursing, social worker, and care management interventions.  Due to safety, skin/wound care, disease management, medication administration, pain management, and patient education the patient requires 24 hour a day rehabilitation nursing.  The patient is currently min to mod assist with mobility and basic ADLs.  Discharge setting and therapy post discharge at home with home health is anticipated.  Patient has agreed to participate in the Acute Inpatient Rehabilitation Program and will admit today.  Preadmission Screen Completed By:  Reche FORBES Lowers, PT, DPT 09/19/2024 2:21 PM ______________________________________________________________________   Discussed status with Dr. Lorilee on 09/20/2024  at 10:02 AM  and received approval for admission today.  Admission Coordinator:  Caitlin E Warren, PT, DPT time 10:02 AM Pattricia 09/20/2024    Assessment/Plan: Diagnosis: Hemorrhagic CVA Does the need for close, 24 hr/day Medical supervision in concert with the patient's rehab needs make it unreasonable for this patient to be served in a less intensive setting? Yes Co-Morbidities requiring supervision/potential complications: HTN, Sjogren's syndrome, SLE, anxiety, depression Due to bladder management, bowel management, safety, skin/wound care, disease management, medication administration, pain management, and patient education, does the patient require 24 hr/day rehab nursing? Yes Does the patient require coordinated care of a physician,  rehab nurse, PT, OT to address physical and functional deficits in the context of the above medical diagnosis(es)? Yes Addressing deficits in the following areas: balance, endurance, locomotion, strength, transferring, bowel/bladder control, bathing, dressing, feeding, grooming, toileting, and psychosocial support Can the patient actively participate in an intensive therapy program of at least 3 hrs of therapy 5 days a week? Yes The potential for patient to make measurable gains while on inpatient rehab is excellent Anticipated functional outcomes upon discharge from inpatient rehab: MinA PT, supervision OT, modified independent SLP Estimated rehab length of stay to reach the above functional goals is: 12-16 days S to MinA Anticipated discharge destination: Home 10. Overall Rehab/Functional Prognosis: excellent   MD Signature: Sven Lorilee, MD     [1]  Current Facility-Administered Medications:    acetaminophen  (TYLENOL ) tablet 650 mg, 650 mg, Oral, Q6H PRN, 650 mg at 09/19/24 0909 **OR** acetaminophen  (TYLENOL ) suppository 650 mg,  650 mg, Rectal, Q6H PRN, Laurita Cort DASEN, MD   artificial tears ophthalmic solution 1 drop, 1 drop, Both Eyes, QID PRN, Laurita, Cort DASEN, MD   atorvastatin  (LIPITOR) tablet 20 mg, 20 mg, Oral, Daily, Amin, Sumayya, MD, 20 mg at 09/19/24 0908   calcium -vitamin D  (OSCAL WITH D) 500-5 MG-MCG per tablet 2 tablet, 2 tablet, Oral, Daily, Amin, Sumayya, MD, 2 tablet at 09/19/24 0908   diphenhydrAMINE  (BENADRYL ) injection 25 mg, 25 mg, Intravenous, Q4H PRN, Laurita Cort DASEN, MD   feeding supplement (ENSURE PLUS HIGH PROTEIN) liquid 237 mL, 237 mL, Oral, BID BM, Amin, Sumayya, MD, 237 mL at 09/19/24 0910   folic acid  (FOLVITE ) tablet 3 mg, 3 mg, Oral, Daily, Amin, Sumayya, MD, 3 mg at 09/19/24 0909   levothyroxine  (SYNTHROID ) tablet 75 mcg, 75 mcg, Oral, Q0600, Kandis Devaughn Sayres, MD, 75 mcg at 09/19/24 0500   losartan  (COZAAR ) tablet 25 mg, 25 mg, Oral, Daily, Amin, Sumayya,  MD, 25 mg at 09/19/24 0909   ondansetron  (ZOFRAN -ODT) disintegrating tablet 4 mg, 4 mg, Oral, Q6H PRN **OR** ondansetron  (ZOFRAN ) injection 4 mg, 4 mg, Intravenous, Q6H PRN, Laurita Cort T, MD   oxyCODONE  (Oxy IR/ROXICODONE ) immediate release tablet 5 mg, 5 mg, Oral, Q4H PRN, Amin, Sumayya, MD, 5 mg at 09/19/24 1223   pantoprazole  (PROTONIX ) EC tablet 40 mg, 40 mg, Oral, Daily, Amin, Sumayya, MD, 40 mg at 09/19/24 0909   polyethylene glycol (MIRALAX  / GLYCOLAX ) packet 17 g, 17 g, Oral, Daily, Wouk, Devaughn Sayres, MD, 17 g at 09/18/24 1024   predniSONE  (DELTASONE ) tablet 2.5 mg, 2.5 mg, Oral, Daily, Amin, Sumayya, MD, 2.5 mg at 09/19/24 0910   senna-docusate (Senokot-S) tablet 1 tablet, 1 tablet, Oral, QHS PRN, Laurita Cort DASEN, MD, 1 tablet at 09/18/24 0059   sertraline  (ZOLOFT ) tablet 25 mg, 25 mg, Oral, Daily, Amin, Sumayya, MD, 25 mg at 09/19/24 [phone]

## 2024-09-19 NOTE — TOC Progression Note (Signed)
 Transition of Care [facility]) - Progression Note    Patient Details  Name: Kim Santiago MRN: [id number] Date of Birth: June 27, 1935  Transition of Care [facility]) CM/SW Contact  Daved JONETTA Hamilton, RN Phone Number: 09/19/2024, 4:53 PM  Clinical Narrative:     Received notification that CIR received insurance authorization. CIR unable to admit patient today, they will follow up tomorrow.   Expected Discharge Plan: Skilled Nursing Facility Barriers to Discharge: Continued Medical Work up               Expected Discharge Plan and Services   Discharge Planning Services: CM Consult Post Acute Care Choice: Skilled Nursing Facility                   DME Arranged: N/A         HH Arranged: NA           Social Drivers of Health (SDOH) Interventions SDOH Screenings   Food Insecurity: No Food Insecurity (09/14/2024)  Housing: Unknown (09/14/2024)  Transportation Needs: No Transportation Needs (09/14/2024)  Utilities: Not At Risk (09/14/2024)  Alcohol  Screen: Low Risk (04/18/2024)  Depression (PHQ2-9): High Risk (09/01/2024)  Financial Resource Strain: Low Risk (04/18/2024)  Physical Activity: Insufficiently Active (04/18/2024)  Social Connections: Moderately Isolated (09/14/2024)  Stress: No Stress Concern Present (04/18/2024)  Tobacco Use: Low Risk (09/14/2024)  Health Literacy: Adequate Health Literacy (04/18/2024)    Readmission Risk Interventions     No data to display

## 2024-09-19 NOTE — Progress Notes (Signed)
 " Progress Note   Patient: Kim Santiago FMW:[id number] DOB: 1934/10/15 DOA: 09/14/2024     5 DOS: the patient was seen and examined on 09/19/2024   Brief hospital course: Partly taken from prior notes.  Kim Santiago is a 89 y.o. female with medical history significant of HTN, hypothyroidism, HLD, Sjgren's syndrome, SLE, GERD, anxiety/depression, sent from nursing home for evaluation of left-sided weakness.  Patient was recently hospitalized for influenza A pneumonia complicated with CAP and discharged to nursing home 2 days ago.   On presentation vital stable, CT head with hemorrhagic stroke on right side of thalamic region with no significant mass effect or midline shift.  Initially PCCM and neurology was consulted, later family decided to proceed with comfort measures only.  Palliative care was consulted.  1/9: Vital stable, patient with significant improvement in alertness and left-sided weakness.  Family now revoked full comfort care status and would like some workup including PT and OT evaluation with the hope that she can go back to rehab.  They do not want any MRI or CTA or lab workup. Discussed with neurology and a repeat CT head was ordered and if the bleed seems stable she can be evaluated with PT and OT. They do not want any surgical intervention and patient will remain DNR at this time.  1/10: Vital stable, repeat CT head with slightly decreased right thalamic hemorrhage, stable edema and no midline shift.  No new foci of hemorrhage.  PT is recommending SNF, patient likely will go back on Monday after getting another insurance authorization.  1/11: Remained hemodynamically stable, slowly improving deficit.  Awaiting going back to SNF.  1/13: Remained hemodynamically stable.  No new deficit.  Insurance authorization was obtained for CIR today.  No bed availability.  They will let us  know tomorrow.  Assessment and Plan: * Hemorrhagic stroke [facility]) Patient was found to have  acute right thalamic hemorrhagic stroke with mild edema or midline shift on initial imaging.  Further workup was not done initially as family wants full comfort care which was revoked this morning.  Mentation is at baseline with improving left-sided deficit.  -Repeat CT head seems stable and no new bleed or worsening edema. - family wants to stay conservative and does not want full stroke workup. -Not a candidate for any antiplatelet or blood thinner at this time.  -PT/OT evaluation-recommending CIR - Obtain insurance authorization for CIR-pending bed availability.  Hypertension Blood pressure currently within goal. -Goal blood pressure at 120 due to hemorrhagic stroke. - Continue home losartan  -Holding home metoprolol  as heart rate was mostly in 50s  Sjogren's syndrome History of lupus. - Patient was on weekly methotrexate  and low-dose prednisone  at home -Continuing prednisone  -Will resume weekly methotrexate  on discharge  Hypothyroidism - Continue home Synthroid   Dyslipidemia - Continue home Crestor  Lumbar compression fracture (HCC) Patient was recently sent to rehab when she was found to have a lumbar compression fracture. No acute concern -PT/OT evaluation -Continue with pain management   Subjective: Patient was resting comfortably when seen today.  No new concern.  Daughter at bedside.  Physical Exam: Vitals:   09/19/24 0400 09/19/24 0800 09/19/24 1152 09/19/24 1552  BP: 113/70 106/60 (!) 110/47 (!) 100/56  Pulse:  84 72 68  Resp: 18 16 18 16   Temp: 98 F (36.7 C) 98.3 F (36.8 C) 98 F (36.7 C) 98.3 F (36.8 C)  TempSrc: Oral  Oral Oral  SpO2: 98% 100% 99% 100%  Weight:  Height:       General.  Frail elderly lady, in no acute distress. Pulmonary.  Lungs clear bilaterally, normal respiratory effort. CV.  Regular rate and rhythm, no JVD, rub or murmur. Abdomen.  Soft, nontender, nondistended, BS positive. CNS.  Alert and oriented .  No focal  neurologic deficit. Extremities.  No edema, no cyanosis, pulses intact and symmetrical. Psychiatry.  Judgment and insight appears normal.   Data Reviewed: Prior data reviewed  Family Communication: Discussed with daughter at bedside  Disposition: Status is: Inpatient Remains inpatient appropriate because: Severity of illness  Planned Discharge Destination: Skilled nursing facility  DVT prophylaxis.  SCDs Time spent: 44 minutes  This record has been created using Conservation officer, historic buildings. Errors have been sought and corrected,but may not always be located. Such creation errors do not reflect on the standard of care.   Author: Amaryllis Dare, MD 09/19/2024 5:32 PM  For on call review [email or web address].  "

## 2024-09-19 NOTE — Progress Notes (Signed)
 Physical Therapy Treatment Patient Details Name: Kim Santiago MRN: [id number] DOB: 1934-12-16 Today's Date: 09/19/2024   History of Present Illness Pt admitted to [facility] on 09/14/24 for c/o stroke like symptoms including: L sided weakness. Imaging significant for: R thalamic hemorrhagic infarct. Recent hospital admission for fluA PNA and L4 compression fx. with DC to short term rehab. Significant PMH includes: medical history significant of HTN, hypothyroidism, HLD, Sjgren's syndrome, SLE, GERD, anxiety/depression.    PT Comments  Pt ready and motivated for session.  She is able to get to R side of bed with use of handrail and cga x 1 with mod encouragement to continue to try on her own.  Sitting balance is generally improved today and she is able to maintain today with R hand support on rail and mod cues to correct as she begins to lean left.  She is able to stand pivot to wheelchair at bedside with mod a x 1.  Cues for hand placements and to stand up fully.  She is set up in chair for lunch with pillow L for support.  She is able to feed herself with set up.  Struggles to remove tops and does ask for help but is able to do on her own with encouragement.  She is able to assist with wheelchair mobility but needs cues for awareness of L arms so it does not drag on wheel and cause injury along with advancing LLE so it does not get caught under chair.  She is able to navigate chair with mod assist and cues.  She stands to rail in hallway with mod a x 1 with good ability to power up.  Once standing she needs cues to stand upright and assist is given so she can walk about 10'.  Once at end of rail she is able to hand hold assist +2 and continue to walk an additional 10' before fatigue.  Increased forward left lean.  She needs cues for proper step length and placement but less buckling is noted today which allows up to progress gait.  She remains in wheelchair with OT to continue session.  Long discussion with  daughter about CIR vs SNF and the differences.  She continues to wish to transition to CIR if available at  this time with the goal of returning home with additional supports and equipment as needed.     If plan is discharge home, recommend the following: Assist for transportation;Help with stairs or ramp for entrance;Assistance with cooking/housework;A lot of help with bathing/dressing/bathroom;Two people to help with walking and/or transfers;Two people to help with bathing/dressing/bathroom   Can travel by private vehicle        Equipment Recommendations       Recommendations for Other Services       Precautions / Restrictions Precautions Precautions: Fall Recall of Precautions/Restrictions: Intact Required Braces or Orthoses: Spinal Brace Spinal Brace: Other (comment) (LSO) Spinal Brace Comments: for comfort for L4 fx Restrictions Weight Bearing Restrictions Per Provider Order: No     Mobility  Bed Mobility Overal bed mobility: Needs Assistance Bed Mobility: Supine to Sit     Supine to sit: Contact guard, HOB elevated, Used rails     General bed mobility comments: much better on L side of bed compared to R yesterday.  encouragement but able to do on her own Patient Response: Cooperative  Transfers Overall transfer level: Needs assistance Equipment used: Rolling walker (2 wheels), 1 person hand held assist Transfers: Sit to/from Stand, Bed  to chair/wheelchair/BSC Sit to Stand: Mod assist Stand pivot transfers: Mod assist              Ambulation/Gait Ambulation/Gait assistance: Min assist, Mod assist, +2 physical assistance Gait Distance (Feet): 40 Feet Assistive device: 2 person hand held assist Gait Pattern/deviations: Decreased step length - right, Decreased step length - left, Decreased stride length, Decreased stance time - left Gait velocity: decreased     General Gait Details: able to walk with mod a x 1 along rail then progress to mod a x 2 HHA with  cues for proper step pattern, and standing upright due to forward L lean   Stairs             Wheelchair Mobility     Tilt Bed Tilt Bed Patient Response: Cooperative  Modified Rankin (Stroke Patients Only)       Balance Overall balance assessment: Needs assistance Sitting-balance support: Feet supported, Bilateral upper extremity supported Sitting balance-Leahy Scale: Poor Sitting balance - Comments: significant improvement today and able to maintain upright with R handhold on rail and mod verbal cues to correct. Postural control: Left lateral lean Standing balance support: Bilateral upper extremity supported, During functional activity, Reliant on assistive device for balance Standing balance-Leahy Scale: Poor Standing balance comment: +1 for static, +2 for dynamic                            Communication Communication Communication: Impaired Factors Affecting Communication: Hearing impaired  Cognition Arousal: Alert Behavior During Therapy: WFL for tasks assessed/performed   PT - Cognitive impairments: No apparent impairments                         Following commands: Intact      Cueing Cueing Techniques: Verbal cues  Exercises      General Comments        Pertinent Vitals/Pain Pain Assessment Pain Assessment: Faces Faces Pain Scale: Hurts little more Pain Location: L upper chest skin sore. Pain Descriptors / Indicators: Discomfort, Grimacing Pain Intervention(s): Limited activity within patient's tolerance, Monitored during session, Repositioned    Home Living                          Prior Function            PT Goals (current goals can now be found in the care plan section) Progress towards PT goals: Progressing toward goals    Frequency    Min 3X/week      PT Plan      Co-evaluation              AM-PAC PT 6 Clicks Mobility   Outcome Measure  Help needed turning from your back to your  side while in a flat bed without using bedrails?: A Little Help needed moving from lying on your back to sitting on the side of a flat bed without using bedrails?: A Lot Help needed moving to and from a bed to a chair (including a wheelchair)?: A Lot Help needed standing up from a chair using your arms (e.g., wheelchair or bedside chair)?: A Lot Help needed to walk in hospital room?: A Lot Help needed climbing 3-5 steps with a railing? : Total 6 Click Score: 12    End of Session Equipment Utilized During Treatment: Gait belt Activity Tolerance: Patient tolerated treatment well Patient left: in chair;with family/visitor  present (with OT) Nurse Communication: Mobility status PT Visit Diagnosis: Difficulty in walking, not elsewhere classified (R26.2);Other abnormalities of gait and mobility (R26.89);Muscle weakness (generalized) (M62.81)     Time: 1245-1330 PT Time Calculation (min) (ACUTE ONLY): 45 min  Charges:    $Gait Training: 8-22 mins $Therapeutic Activity: 23-37 mins PT General Charges $$ ACUTE PT VISIT: 1 Visit                   Lauraine Gills, PTA 09/19/2024, 1:44 PM

## 2024-09-19 NOTE — Progress Notes (Signed)
 Inpatient Rehab Admissions Coordinator:   Insurance auth obtained after peer to peer with Dr. Babs.  Will plan for tentative admit tomorrow pending bed availability.   Reche Lowers, PT, DPT Admissions Coordinator [phone] 09/19/2024 2:07 PM

## 2024-09-19 NOTE — Progress Notes (Signed)
 Occupational Therapy Treatment Patient Details Name: Kim Santiago MRN: [id number] DOB: [birth year] Today's Date: 09/19/2024   History of present illness Pt admitted to [facility] on 09/14/24 for c/o stroke like symptoms including: L sided weakness. Imaging significant for: R thalamic hemorrhagic infarct. Recent hospital admission for fluA PNA and L4 compression fx. with DC to short term rehab. Significant PMH includes: medical history significant of HTN, hypothyroidism, HLD, Sjgren's syndrome, SLE, GERD, anxiety/depression.   OT comments  Patient seen for OT treatment on this date. Upon arrival to room patient sitting in w/c with PT, initial focus on ambulation; patient was able to ambulate with PT with close wheelchair follow, needed x2 assist intermittently. Cues needed throughout. OT focused on maintaining midline and finding midline as she continues to present with significant L lateral lean with good improvement in maintaining midline after cross body therex. Per patient request, OT wheeled down in w/c to hospital lobby as she reports I would love to know where I am, patient required cues throughout to maintain midline as she presented with poor awareness of LUE (leaning hand down into wheelchair wheel without noticing) Once back in room, used mirror for visual feedback for continued midline education with good return demo. OT set patients recliner on the R side of the room to improve patients inattention to the L, daughter agreeable. Patient performed SPT from w/c to recliner with mod A and cues for body mechanics. Patient ended treatment in recliner with bed/chair alarm on and all needs within reach. Patient making good progress toward goals, will continue to follow POC. Discharge recommendation remains appropriate.        If plan is discharge home, recommend the following:  A lot of help with walking and/or transfers;A lot of help with bathing/dressing/bathroom;Help with stairs or ramp for  entrance;Assistance with cooking/housework;Two people to help with walking and/or transfers   Equipment Recommendations  None recommended by OT    Recommendations for Other Services      Precautions / Restrictions Precautions Precautions: Fall Recall of Precautions/Restrictions: Intact Required Braces or Orthoses: Spinal Brace Spinal Brace: Other (comment) Spinal Brace Comments: for comfort for L4 fx Restrictions Weight Bearing Restrictions Per Provider Order: No       Mobility Bed Mobility               General bed mobility comments: OOB pre/post tx    Transfers Overall transfer level: Needs assistance Equipment used: Rolling walker (2 wheels), 1 person hand held assist Transfers: Sit to/from Stand, Bed to chair/wheelchair/BSC Sit to Stand: Mod assist Stand pivot transfers: Mod assist         General transfer comment: cues for hand placement, tactile cues for LUE management     Balance Overall balance assessment: Needs assistance Sitting-balance support: Feet supported, Bilateral upper extremity supported Sitting balance-Leahy Scale: Poor   Postural control: Left lateral lean Standing balance support: Bilateral upper extremity supported, During functional activity, Reliant on assistive device for balance Standing balance-Leahy Scale: Poor Standing balance comment: +1 for static, +2 for dynamic                           ADL either performed or assessed with clinical judgement   ADL Overall ADL's : Needs assistance/impaired  General ADL Comments: anticipate max A for ADLs due to significant balance deficits, LUE coordination impairment, L posteriolateral lean    Extremity/Trunk Assessment Upper Extremity Assessment Upper Extremity Assessment: LUE deficits/detail LUE Deficits / Details: inattention            Vision       Perception Perception Perception: Impaired Preception  Impairment Details: Inattention/Neglect Perception-Other Comments: inattention to LUE/L visual field   Praxis Praxis Praxis: Impaired Praxis Impairment Details: Motor planning   Communication Communication Communication: Impaired Factors Affecting Communication: Hearing impaired   Cognition Arousal: Alert Behavior During Therapy: WFL for tasks assessed/performed Cognition: No apparent impairments                               Following commands: Intact        Cueing   Cueing Techniques: Verbal cues  Exercises Other Exercises Other Exercises: weight shifting side to side while seated for midline awareness with good improvement in maintaingin midline    Shoulder Instructions       General Comments      Pertinent Vitals/ Pain       Pain Assessment Pain Assessment: 0-10 Pain Score: 3  Pain Location: L upper chest skin sore. Pain Descriptors / Indicators: Discomfort, Grimacing Pain Intervention(s): Premedicated before session  Home Living                                          Prior Functioning/Environment              Frequency  Min 3X/week        Progress Toward Goals  OT Goals(current goals can now be found in the care plan section)  Progress towards OT goals: Progressing toward goals     Plan      Co-evaluation                 AM-PAC OT 6 Clicks Daily Activity     Outcome Measure   Help from another person eating meals?: A Little Help from another person taking care of personal grooming?: A Little Help from another person toileting, which includes using toliet, bedpan, or urinal?: A Lot Help from another person bathing (including washing, rinsing, drying)?: A Lot Help from another person to put on and taking off regular upper body clothing?: A Lot Help from another person to put on and taking off regular lower body clothing?: A Lot 6 Click Score: 14    End of Session Equipment Utilized During  Treatment: Gait belt  OT Visit Diagnosis: Other abnormalities of gait and mobility (R26.89);Muscle weakness (generalized) (M62.81);Ataxia, unspecified (R27.0);Apraxia (R48.2)   Activity Tolerance Patient tolerated treatment well   Patient Left in chair;with call bell/phone within reach;with chair alarm set   Nurse Communication Mobility status        Time: 8679-8647 OT Time Calculation (min): 32 min  Charges: OT General Charges $OT Visit: 1 Visit OT Treatments $Self Care/Home Management : 8-22 mins $Neuromuscular Re-education: 8-22 mins  Rogers Clause, OT/L MSOT, 09/19/2024

## 2024-09-19 NOTE — Progress Notes (Signed)
 SPIRITUAL CARE AND COUNSELING CONSULT NOTE   VISIT SUMMARY  Family is at bedside  SPIRITUAL ENCOUNTER                                                                                                                                                                      Type of Visit: Follow up Care provided to:: Pt and family Conversation partners present during encounter: Nurse Referral source: Chaplain assessment Reason for visit: Routine spiritual support OnCall Visit: No   SPIRITUAL FRAMEWORK  Presenting Themes: Community and relationships   GOALS       INTERVENTIONS   Spiritual Care Interventions Made: Encouragement    INTERVENTION OUTCOMES   Outcomes: Connection to spiritual care, Awareness of support  SPIRITUAL CARE PLAN   Spiritual Care Issues Still Outstanding: No further spiritual care needs at this time (see row info)    If immediate needs arise, please contact [facility] 24 hour on call [phone]   Sari Hugh, Chaplain  09/19/2024 2:43 PM

## 2024-09-20 ENCOUNTER — Other Ambulatory Visit: Payer: Self-pay | Admitting: Internal Medicine

## 2024-09-20 ENCOUNTER — Encounter (HOSPITAL_COMMUNITY): Payer: Self-pay | Admitting: Physical Medicine & Rehabilitation

## 2024-09-20 ENCOUNTER — Inpatient Hospital Stay (HOSPITAL_COMMUNITY)
Admission: AD | Admit: 2024-09-20 | Discharge: 2024-10-08 | DRG: 057 | Disposition: A | Discharge status: Rehabilitation Facility | Source: Other Acute Inpatient Hospital | Attending: Physical Medicine & Rehabilitation | Admitting: Physical Medicine & Rehabilitation

## 2024-09-20 ENCOUNTER — Other Ambulatory Visit: Payer: Self-pay

## 2024-09-20 DIAGNOSIS — E039 Hypothyroidism, unspecified: Secondary | ICD-10-CM | POA: Diagnosis not present

## 2024-09-20 DIAGNOSIS — D638 Anemia in other chronic diseases classified elsewhere: Secondary | ICD-10-CM | POA: Diagnosis present

## 2024-09-20 DIAGNOSIS — I959 Hypotension, unspecified: Secondary | ICD-10-CM | POA: Diagnosis not present

## 2024-09-20 DIAGNOSIS — M329 Systemic lupus erythematosus, unspecified: Secondary | ICD-10-CM | POA: Diagnosis present

## 2024-09-20 DIAGNOSIS — S32010S Wedge compression fracture of first lumbar vertebra, sequela: Secondary | ICD-10-CM | POA: Diagnosis not present

## 2024-09-20 DIAGNOSIS — I619 Nontraumatic intracerebral hemorrhage, unspecified: Secondary | ICD-10-CM | POA: Diagnosis present

## 2024-09-20 DIAGNOSIS — M3500 Sicca syndrome, unspecified: Secondary | ICD-10-CM | POA: Diagnosis present

## 2024-09-20 DIAGNOSIS — I10 Essential (primary) hypertension: Secondary | ICD-10-CM | POA: Diagnosis present

## 2024-09-20 DIAGNOSIS — I610 Nontraumatic intracerebral hemorrhage in hemisphere, subcortical: Principal | ICD-10-CM | POA: Diagnosis present

## 2024-09-20 DIAGNOSIS — S32000A Wedge compression fracture of unspecified lumbar vertebra, initial encounter for closed fracture: Secondary | ICD-10-CM | POA: Diagnosis present

## 2024-09-20 DIAGNOSIS — Z7401 Bed confinement status: Secondary | ICD-10-CM | POA: Diagnosis not present

## 2024-09-20 DIAGNOSIS — R531 Weakness: Secondary | ICD-10-CM | POA: Diagnosis present

## 2024-09-20 DIAGNOSIS — E871 Hypo-osmolality and hyponatremia: Secondary | ICD-10-CM | POA: Diagnosis present

## 2024-09-20 DIAGNOSIS — I69398 Other sequelae of cerebral infarction: Secondary | ICD-10-CM

## 2024-09-20 DIAGNOSIS — I214 Non-ST elevation (NSTEMI) myocardial infarction: Secondary | ICD-10-CM | POA: Diagnosis present

## 2024-09-20 DIAGNOSIS — R110 Nausea: Secondary | ICD-10-CM | POA: Diagnosis not present

## 2024-09-20 DIAGNOSIS — E785 Hyperlipidemia, unspecified: Secondary | ICD-10-CM | POA: Diagnosis not present

## 2024-09-20 MED ORDER — ACETAMINOPHEN 325 MG PO TABS: 325.0000 mg | ORAL_TABLET | ORAL | Status: DC | PRN

## 2024-09-20 MED ORDER — POLYVINYL ALCOHOL 1.4 % OP SOLN: 1.0000 [drp] | Freq: Four times a day (QID) | OPHTHALMIC | Status: DC | PRN

## 2024-09-20 MED ORDER — ACETAMINOPHEN 325 MG PO TABS: 650.0000 mg | ORAL_TABLET | Freq: Four times a day (QID) | ORAL | Status: AC | PRN

## 2024-09-20 MED ORDER — POLYETHYLENE GLYCOL 3350 17 G PO PACK: 17.0000 g | PACK | Freq: Every day | ORAL | Status: DC

## 2024-09-20 MED ORDER — OXYCODONE HCL 5 MG PO TABS: 5.0000 mg | ORAL_TABLET | Freq: Three times a day (TID) | ORAL | 0 refills | Status: DC | PRN

## 2024-09-20 MED ORDER — POLYETHYLENE GLYCOL 3350 17 G PO PACK
17.0000 g | PACK | Freq: Every day | ORAL | Status: DC
  Administered 2024-09-20 – 2024-09-22 (×3): 17 g via ORAL
  Filled 2024-09-20 (×5): qty 1

## 2024-09-20 MED ORDER — PROCHLORPERAZINE EDISYLATE 10 MG/2ML IJ SOLN: 5.0000 mg | Freq: Four times a day (QID) | INTRAMUSCULAR | Status: DC | PRN

## 2024-09-20 MED ORDER — ALUM & MAG HYDROXIDE-SIMETH 200-200-20 MG/5ML PO SUSP
30.0000 mL | ORAL | Status: DC | PRN
  Administered 2024-10-08: 30 mL via ORAL
  Filled 2024-09-20: qty 30

## 2024-09-20 MED ORDER — LEVOTHYROXINE SODIUM 75 MCG PO TABS
75.0000 ug | ORAL_TABLET | Freq: Every day | ORAL | Status: DC
  Administered 2024-09-21 – 2024-10-08 (×18): 75 ug via ORAL
  Filled 2024-09-20 (×18): qty 1

## 2024-09-20 MED ORDER — ACETAMINOPHEN 500 MG PO TABS
1000.0000 mg | ORAL_TABLET | Freq: Three times a day (TID) | ORAL | Status: DC
  Administered 2024-09-20 – 2024-10-08 (×55): 1000 mg via ORAL
  Filled 2024-09-20 (×56): qty 2

## 2024-09-20 MED ORDER — ENSURE PLUS HIGH PROTEIN PO LIQD
237.0000 mL | Freq: Two times a day (BID) | ORAL | Status: DC
  Administered 2024-09-20 – 2024-10-06 (×20): 237 mL via ORAL

## 2024-09-20 MED ORDER — PANTOPRAZOLE SODIUM 40 MG PO TBEC
40.0000 mg | DELAYED_RELEASE_TABLET | Freq: Every day | ORAL | Status: DC
  Administered 2024-09-21 – 2024-10-08 (×18): 40 mg via ORAL
  Filled 2024-09-20 (×18): qty 1

## 2024-09-20 MED ORDER — OXYCODONE HCL 5 MG PO TABS: 5.0000 mg | ORAL_TABLET | ORAL | Status: DC | PRN

## 2024-09-20 MED ORDER — TRIAMCINOLONE ACETONIDE 0.1 % EX CREA: TOPICAL_CREAM | Freq: Two times a day (BID) | CUTANEOUS | Status: DC | PRN

## 2024-09-20 MED ORDER — OYSTER SHELL CALCIUM/D3 500-5 MG-MCG PO TABS
2.0000 | ORAL_TABLET | Freq: Every day | ORAL | Status: DC
  Administered 2024-09-21 – 2024-10-08 (×18): 2 via ORAL
  Filled 2024-09-20 (×19): qty 2

## 2024-09-20 MED ORDER — DIPHENHYDRAMINE HCL 25 MG PO CAPS: 25.0000 mg | ORAL_CAPSULE | Freq: Four times a day (QID) | ORAL | Status: DC | PRN

## 2024-09-20 MED ORDER — PROCHLORPERAZINE 25 MG RE SUPP: 12.5000 mg | Freq: Four times a day (QID) | RECTAL | Status: DC | PRN

## 2024-09-20 MED ORDER — METHOTREXATE SODIUM 2.5 MG PO TABS
20.0000 mg | ORAL_TABLET | ORAL | Status: DC
  Filled 2024-09-20: qty 8

## 2024-09-20 MED ORDER — FLEET ENEMA RE ENEM: 1.0000 | ENEMA | Freq: Once | RECTAL | Status: DC | PRN

## 2024-09-20 MED ORDER — ATORVASTATIN CALCIUM 10 MG PO TABS
20.0000 mg | ORAL_TABLET | Freq: Every day | ORAL | Status: DC
  Administered 2024-09-21 – 2024-10-08 (×18): 20 mg via ORAL
  Filled 2024-09-20 (×18): qty 2

## 2024-09-20 MED ORDER — LOSARTAN POTASSIUM 25 MG PO TABS: 25.0000 mg | ORAL_TABLET | Freq: Every day | ORAL | Status: DC

## 2024-09-20 MED ORDER — ENSURE PLUS HIGH PROTEIN PO LIQD: 237.0000 mL | Freq: Two times a day (BID) | ORAL | Status: AC

## 2024-09-20 MED ORDER — PREDNISONE 5 MG PO TABS
2.5000 mg | ORAL_TABLET | Freq: Every day | ORAL | Status: DC
  Administered 2024-09-21 – 2024-10-08 (×18): 2.5 mg via ORAL
  Filled 2024-09-20 (×18): qty 1

## 2024-09-20 MED ORDER — LOSARTAN POTASSIUM 25 MG PO TABS
12.5000 mg | ORAL_TABLET | Freq: Every day | ORAL | Status: DC
  Administered 2024-09-21 – 2024-09-22 (×2): 12.5 mg via ORAL
  Filled 2024-09-20 (×2): qty 1

## 2024-09-20 MED ORDER — POLYVINYL ALCOHOL 1.4 % OP SOLN: 1.0000 [drp] | Freq: Four times a day (QID) | OPHTHALMIC | 0 refills | Status: AC | PRN

## 2024-09-20 MED ORDER — ENOXAPARIN SODIUM 40 MG/0.4ML IJ SOSY
40.0000 mg | PREFILLED_SYRINGE | INTRAMUSCULAR | Status: DC
  Administered 2024-09-20 – 2024-10-08 (×19): 40 mg via SUBCUTANEOUS
  Filled 2024-09-20 (×19): qty 0.4

## 2024-09-20 MED ORDER — ARTIFICIAL TEARS OPHTHALMIC OINT
TOPICAL_OINTMENT | Freq: Every day | OPHTHALMIC | Status: DC
  Filled 2024-09-20: qty 3.5

## 2024-09-20 MED ORDER — RISAQUAD PO CAPS
1.0000 | ORAL_CAPSULE | Freq: Every day | ORAL | Status: DC
  Administered 2024-09-20 – 2024-10-08 (×19): 1 via ORAL
  Filled 2024-09-20 (×20): qty 1

## 2024-09-20 MED ORDER — PROCHLORPERAZINE MALEATE 5 MG PO TABS
5.0000 mg | ORAL_TABLET | Freq: Four times a day (QID) | ORAL | Status: DC | PRN
  Administered 2024-09-21 – 2024-09-25 (×4): 10 mg via ORAL
  Filled 2024-09-20 (×4): qty 2

## 2024-09-20 MED ORDER — SERTRALINE HCL 50 MG PO TABS
25.0000 mg | ORAL_TABLET | Freq: Every day | ORAL | Status: DC
  Administered 2024-09-21 – 2024-10-08 (×18): 25 mg via ORAL
  Filled 2024-09-20 (×18): qty 1

## 2024-09-20 MED ORDER — GUAIFENESIN-DM 100-10 MG/5ML PO SYRP: 5.0000 mL | ORAL_SOLUTION | Freq: Four times a day (QID) | ORAL | Status: DC | PRN

## 2024-09-20 MED ORDER — MELATONIN 5 MG PO TABS
5.0000 mg | ORAL_TABLET | Freq: Every evening | ORAL | Status: DC | PRN
  Administered 2024-09-25 – 2024-10-07 (×11): 5 mg via ORAL
  Filled 2024-09-20 (×12): qty 1

## 2024-09-20 MED ORDER — FOLIC ACID 1 MG PO TABS
3.0000 mg | ORAL_TABLET | Freq: Every day | ORAL | Status: DC
  Administered 2024-09-21 – 2024-10-07 (×16): 3 mg via ORAL
  Filled 2024-09-20 (×18): qty 3

## 2024-09-20 MED ORDER — METHOTREXATE SODIUM 2.5 MG PO TABS
20.0000 mg | ORAL_TABLET | ORAL | Status: DC
  Administered 2024-09-24 – 2024-10-08 (×3): 20 mg via ORAL
  Filled 2024-09-20 (×3): qty 8

## 2024-09-20 MED ORDER — BISACODYL 10 MG RE SUPP
10.0000 mg | Freq: Every day | RECTAL | Status: DC | PRN
  Administered 2024-09-21 – 2024-09-30 (×2): 10 mg via RECTAL
  Filled 2024-09-20 (×2): qty 1

## 2024-09-20 MED ORDER — POLYVINYL ALCOHOL 1.4 % OP SOLN
1.0000 [drp] | Freq: Three times a day (TID) | OPHTHALMIC | Status: DC
  Administered 2024-09-20 – 2024-10-08 (×52): 1 [drp] via OPHTHALMIC
  Filled 2024-09-20 (×2): qty 15

## 2024-09-20 NOTE — Assessment & Plan Note (Signed)
-   Continue home Crestor

## 2024-09-20 NOTE — Progress Notes (Signed)
 Inpatient Rehabilitation Admission Medication Review by a Pharmacist  A complete drug regimen review was completed for this patient to identify any potential clinically significant medication issues.  High Risk Drug Classes Is patient taking? Indication by Medication  Antipsychotic Yes Compazine  - nausea/vomiting  Anticoagulant Yes Lovenox  - DVT prophylaxis  Antibiotic No   Opioid Yes Oxycodone  - pain  Antiplatelet No   Hypoglycemics/insulin No   Vasoactive Medication Yes Losartan  - HTN  Chemotherapy Yes Methotrexate  - Sjogren's syndrome and SLE  Other Yes Lipitor - HLD Synthroid  - hypothyroid Prednisone  - Sjogren's syndrome and SLE Sertraline  - MDD Melatonin - sleep Miralax  - constipation Calcium  and folic acid  - supplementation     Type of Medication Issue Identified Description of Issue Recommendation(s)  Drug Interaction(s) (clinically significant)     Duplicate Therapy     Allergy     No Medication Administration End Date     Incorrect Dose     Additional Drug Therapy Needed     Significant med changes from prior encounter (inform family/care partners about these prior to discharge).    Other       Clinically significant medication issues were identified that warrant physician communication and completion of prescribed/recommended actions by midnight of the next day:  No  Name of provider notified for urgent issues identified:   Provider Method of Notification:     Pharmacist comments:   Time spent performing this drug regimen review (minutes):  15   Kim Santiago Alert 09/20/2024 1:31 PM

## 2024-09-20 NOTE — Assessment & Plan Note (Signed)
 Patient was recently sent to rehab when she was found to have a lumbar compression fracture. No acute concern -PT/OT evaluation -Continue with pain management

## 2024-09-20 NOTE — H&P (Signed)
 "   Physical Medicine and Rehabilitation Admission H&P    Chief Complaint  Patient presents with   Functional deficits due to Thalamic hemorrhage and recent admission for Flu, w/PNA as well as fall with lumbar compression Fx.    HPI:  Kim Santiago is an 89 year old female with history of HTN, Sjogren's, cutaneous lupus, CVA with mild left sided weakness, Left Breast CA, thyroid  CA, CKD, recent admissions for Flu w/PNA  12/22-24 and readmission 12/31-01/06 after fall with L4  compression Fx (declined Kyphoplasty and tx w/brace per Dr. Penne Sharps) who was readmitted on 09/14/24 with left sided weakness and found to have right thalamic hemorrhage with mild edema.  Family initially requested comfort care therefore Palliative care consulted. As patient was showing improvement by 01/09, family elected on treating the treatable, no further testing or aggressive measures as well as short term rehab.  Repeat CT head showed slight decrease in size of bleed with stable edema. PT/OT/ST was consulted tor evaluations and patient noted to have mild oropharyngeal dysphagia with increased time for mastication and some pocketing requiring aspiration precautions, LLE weakness, left inattention, severe LUE ataxia with mild apraxia, strong left lean and cues for posture. Has back brace for comfort and her TLSO was changed to LSO due to DTI with skin breakdown. She requires min to mod assist with mobility and mod assist with ADL tasks.   She was independent with use of rollator prior to December admission and d/c to SNF for rehab recently. She has been living with family for the past 5 year. Family plans on providing assistance after discharge. CIR recommended due to functional decline. Had emesis on the ride over from [facility] but is feeling well now.   Review of Systems  HENT:  Positive for hearing loss.   Eyes:  Negative for blurred vision and double vision.  Respiratory:  Negative for cough and shortness of  breath.   Cardiovascular:  Negative for chest pain.  Gastrointestinal:  Positive for constipation.  Genitourinary:  Negative for dysuria.       Hot urine at times  Musculoskeletal:  Positive for back pain and joint pain.  Neurological:  Positive for weakness and headaches (since the bleed).  Psychiatric/Behavioral:  The patient does not have insomnia.     Past Medical History:  Diagnosis Date   Arthritis    Breast cancer (HCC) 2017   left mastectomy done 11/2015   Breast cancer in female [facility]) 11/18/2015   Left: 3.9 cm tumor, T2, 1/2 sentinel nodes positive for macro metastatic disease, N1, 3 negative nodes in the axillary tail, ER+,PR+, Her 2 neu, low Mammoprint score   Cancer (HCC)    thyroid  takes levothyroxine    CAP (community acquired pneumonia) 05/05/2021   Chronic kidney disease    UTI   Genetic screening 11/2015   Mammoprint of left breast cancer: Low risk for recurrence.    History of heart attack 06/12/2014   Hypertension    hypothyroidism    secondary to thyroidectomy for thyroid  ca   Hypothyroidism    Lupus    subcutaneous   Menopause 40s   natural, hot flashes and mood lability now gone, off prempro 7 months   Myocardial infarction [facility]) 2013   Osteoporosis    Osteopenia   Rosacea    Sjoegren syndrome    Stress-induced cardiomyopathy September of 2013   EF 35%. Peak troponin was 1.8.   Stroke [facility]) 10/2018    Past Surgical History:  Procedure  Laterality Date   BACK SURGERY     BREAST BIOPSY Left 10/30/15   positive, done in Dr. Fredirick office   CARDIAC CATHETERIZATION  05/2012   [facility]. No significant CAD. Ejection fraction of 35% due to stress-induced cardiomyopathy.   CHOLECYSTECTOMY     COLONOSCOPY     DILATION AND CURETTAGE OF UTERUS     KYPHOSIS SURGERY  Feb 2008   L1, Dr. Kathi   LUMBAR DISC SURGERY     L4-L5   MASTECTOMY Left 11/18/2015   positive   SENTINEL NODE BIOPSY Left 11/18/2015   Procedure: SENTINEL NODE BIOPSY;  Surgeon: Reyes LELON Cota, MD;  Location: [facility] ORS;  Service: General;  Laterality: Left;   SHOULDER ARTHROSCOPY  2004   Left, Dr. Maryl   SIMPLE MASTECTOMY WITH AXILLARY SENTINEL NODE BIOPSY Left 11/18/2015   Procedure: SIMPLE MASTECTOMY;  Surgeon: Reyes LELON Cota, MD;  Location: [facility] ORS;  Service: General;  Laterality: Left;   SPINE SURGERY     L4-5 diskectomy   THYROIDECTOMY     Thyroid  Cancer   TONSILLECTOMY     TUBAL LIGATION      Family History  Problem Relation Age of Onset   Kidney disease Mother    Heart disease Mother    COPD Father    Cancer Father        esophageal   Kidney disease Sister    Cancer Brother 38       colon cancer (both brothers)   Cancer Brother    Heart attack Brother 50   Heart disease Brother    Breast cancer Neg Hx     Social History:  Widowed. Worked as a Publishing Rights Manager for many years.  Has been living with daughter and SIL for the past 5 years. She reports that she has never smoked. She has never used smokeless tobacco. She reports that she does not drink alcohol  and does not use drugs.   Allergies: Allergies[1]   Medications Prior to Admission  Medication Sig Dispense Refill   acetaminophen  (TYLENOL ) 500 MG tablet Take 500 mg by mouth every 4 (four) hours as needed for mild pain (pain score 1-3) or moderate pain (pain score 4-6).     atorvastatin  (LIPITOR) 20 MG tablet TAKE 1 TABLET BY MOUTH EVERY DAY AT 6 PM 90 tablet 3   Calcium  Carbonate-Vitamin D  600-200 MG-UNIT TABS Take 2 tablets by mouth daily.     CRANBERRY PO Take 1 capsule by mouth 2 (two) times daily.     folic acid  (FOLVITE ) 1 MG tablet Take 3 mg by mouth daily.     furosemide  (LASIX ) 20 MG tablet TAKE 1 TABLET BY MOUTH EVERY OTHER DAY AS NEEDED (Patient taking differently: Take 20 mg by mouth See admin instructions. Take 20 mg by mouth every other day as needed for fluid.) 45 tablet 2   levothyroxine  (SYNTHROID ) 75 MCG tablet TAKE 1 TABLET BY MOUTH (75MCG TOTAL) DAILY 90 tablet 2    losartan  (COZAAR ) 25 MG tablet Take 25 mg by mouth daily.     methotrexate  (RHEUMATREX) 2.5 MG tablet Take 20 mg by mouth once a week. Caution:Chemotherapy. Protect from light.     metoprolol  succinate (TOPROL -XL) 25 MG 24 hr tablet Take 25 mg by mouth daily.     omeprazole  (PRILOSEC) 20 MG capsule Take 1 capsule (20 mg total) by mouth 2 (two) times daily as needed. (Patient taking differently: Take 20 mg by mouth 2 (two) times daily as needed (reflux).)  ondansetron  (ZOFRAN ) 4 MG tablet Take 4 mg by mouth every 6 (six) hours as needed for nausea or vomiting.     polyethylene glycol (MIRALAX  / GLYCOLAX ) packet Take 17 g by mouth daily.     predniSONE  (DELTASONE ) 2.5 MG tablet Take 2.5 mg by mouth daily.     Probiotic Product (PROBIOTIC PO) Take 1 tablet by mouth daily.     sertraline  (ZOLOFT ) 25 MG tablet TAKE 1 TABLET (25 MG TOTAL) BY MOUTH DAILY. 90 tablet 1   acetaminophen  (TYLENOL ) 325 MG tablet Take 2 tablets (650 mg total) by mouth every 6 (six) hours as needed for fever (Fever >/= 101).     artificial tears ophthalmic solution Place 1 drop into both eyes 4 (four) times daily as needed for dry eyes. 15 mL 0   feeding supplement (ENSURE PLUS HIGH PROTEIN) LIQD Take 237 mLs by mouth 2 (two) times daily between meals.     fluticasone  (FLONASE ) 50 MCG/ACT nasal spray Place 1 spray into both nostrils daily. (Patient not taking: Reported on 09/20/2024)     oxyCODONE  (OXY IR/ROXICODONE ) 5 MG immediate release tablet Take 1 tablet (5 mg total) by mouth every 8 (eight) hours as needed for moderate pain (pain score 4-6). (Patient not taking: Reported on 09/20/2024) 10 tablet 0   Sodium Fluoride 1.1 % PSTE Place 1 Application onto teeth at bedtime. (Patient not taking: Reported on 09/20/2024)     triamcinolone  cream (KENALOG ) 0.1 % Apply 1 Application topically 2 (two) times daily. (Patient not taking: Reported on 09/20/2024)        Home: Home Living Family/patient expects to be discharged to::  Private residence Living Arrangements: Children Available Help at Discharge: Family, Available PRN/intermittently Type of Home: House Home Access: Level entry Home Layout: Two level, Able to live on main level with bedroom/bathroom Bathroom Shower/Tub: Health Visitor: Standard Home Equipment: Toilet riser, Rollator (4 wheels), BSC/3in1, Grab bars - tub/shower, Agricultural Consultant (2 wheels), Shower seat - built in Additional Comments: Pt has been living with daughter and SIL, though per chart, family has been concerned about pt's decline over the last year.   Functional History: Prior Function Prior Level of Function : Needs assist Mobility Comments: mod I with rollator, fall prior to admission. ADLs Comments: admit from [facility]; needing assist for ADL's and IADL's. normally mod I at baseline with PRN care from daughter for IADL's   Functional Status:  Mobility: Bed Mobility Overal bed mobility: Needs Assistance Bed Mobility: Supine to Sit Sidelying to sit: Min assist, Mod assist Supine to sit: Contact guard, HOB elevated, Used rails General bed mobility comments: OOB pre/post tx Transfers Overall transfer level: Needs assistance Equipment used: Rolling walker (2 wheels), 1 person hand held assist Transfers: Sit to/from Stand, Bed to chair/wheelchair/BSC Sit to Stand: Mod assist Bed to/from chair/wheelchair/BSC transfer type:: Stand pivot Stand pivot transfers: Mod assist General transfer comment: cues for hand placement, tactile cues for LUE management Ambulation/Gait Ambulation/Gait assistance: Min assist, Mod assist, +2 physical assistance Gait Distance (Feet): 40 Feet Assistive device: 2 person hand held assist Gait Pattern/deviations: Decreased step length - right, Decreased step length - left, Decreased stride length, Decreased stance time - left General Gait Details: able to walk with mod a x 1 along rail then progress to mod a x 2 HHA with cues for proper  step pattern, and standing upright due to forward L lean Gait velocity: decreased   ADL: ADL Overall ADL's : Needs assistance/impaired Eating/Feeding: Set up Eating/Feeding  Details (indicate cue type and reason): positioning required d/t strong L sided lean Lower Body Dressing: Moderate assistance Lower Body Dressing Details (indicate cue type and reason): able to demo figure 4 position for attempt to don socks, but required OT assist to don d/t pt requiring mod A to maintain sitting balance EOB Toilet Transfer: Moderate assistance, Rolling walker (2 wheels) Toilet Transfer Details (indicate cue type and reason): simulated to bedside chair. Functional mobility during ADLs: Moderate assistance, Rolling walker (2 wheels) General ADL Comments: anticipate max A for ADLs due to significant balance deficits, LUE coordination impairment, L posteriolateral lean   Cognition: Cognition Orientation Level: Oriented X4 Cognition Arousal: Alert Behavior During Therapy: WFL for tasks assessed/performed  Physical Exam: Blood pressure (!) 106/48, pulse 77, temperature 97.9 F (36.6 C), resp. rate 16, height 5' 3 (1.6 m), weight 55.7 kg, SpO2 98%. Gen: no distress, normal appearing HEENT: oral mucosa pink and moist, NCAT Cardio: Reg rate Chest: normal effort, normal rate of breathing Abd: soft, non-distended Ext: no edema Psych: pleasant, normal affect Skin: intact Neuro:Physical Exam Vitals and nursing note reviewed.  Constitutional:      Appearance: Normal appearance.  Neurological:     Mental Status: She is alert and oriented to person, place, and time.     Comments: Mild left facial droop. Able to answer most orientation questions--had a little difficulty  naming the hospital. She is able to follow simple one and two step commands. LUE with significant ataxia.  Sensation appears to be intact. LLE is with 3/5 strength, LUE has 4/5 strength. Right sided strength is intact.    No results  found for this or any previous visit (from the past 48 hours). No results found.    Blood pressure (!) 106/48, pulse 77, temperature 97.9 F (36.6 C), resp. rate 16, height 5' 3 (1.6 m), weight 55.7 kg, SpO2 98%.  Medical Problem List and Plan: 1. Functional deficits secondary to right thalamic intraparenchymal hemorrhage  -patient may shower  -ELOS/Goals: 12-16 days MinA  Admit to CIR  2.  Antithrombotics: -DVT/anticoagulation:  Pharmaceutical: Lovenox   -antiplatelet therapy: N/A due to thalamic hemorrhage 3. Pain Management: Tylenol  prn mild pain and oxycodone  prn severe pain  --will add K pad for local measures. Voltaren gel to heel  4. Mood/Behavior/Sleep:  LCSW to follow for evaluation and support.  --Melatonin prn for insomnia  -antipsychotic agents: N/A 5. Neuropsych/cognition: This patient is capable of making decisions on her own behalf. 6. Skin/Wound Care: Routine pressure relief measures.   --TLSO d/c due to excessive bruising with use.   -- 7. Fluids/Electrolytes/Nutrition: Monitor I/O. Continue nutritional supplements 8. Hypotension: decrease cozaar  to 12.5mg  after supper  9.  Chronic hyponatremia: Continue to monitor for stability  --baseline 130 going per chart review 10. L4 Compression Fx: Has spinal stenosis with impingement of L4 and L5 nerves per imaging --Chronic L1 compression Fx s/p kyphoplasty.  LSO was used at [facility] but not being used at the hospital per daughter --Will order for support.   11.  Chronic diastolic HF: Monitor for signs of overload. Check daily weighs.   --Used lasix  prn PTA  12. Hx of Sjogren syndrome/cutaneous Lupus:On low dose prednisone .  --will resume Methotrexate  per recommendations--every Sunday per family --Eye drops qid with lacrilube at nights.   13. Anemia of chronic disease: Baseline Hgb around 9  --Was transfused w/one unit on 12/23 and stable. Monitor for signs of bleeding  --Reactive thrombocytopenia has resolved.    14.H/o Depression: continue Zoloft .  15. Hx of Left foot ankle pain: Calcaneal enthesopathy with tendinosis distal Achilles tendon.    16. Hypothyroid s/p thyroidectomy: continue supplement.    I have personally performed a face to face diagnostic evaluation, including, but not limited to relevant history and physical exam findings, of this patient and developed relevant assessment and plan.  Additionally, I have reviewed and concur with the physician assistant's documentation above.  Toribio PARAS Angiulli, PA-C   Sven SHAUNNA Elks, MD 09/20/2024      [1]  Allergies Allergen Reactions   Naprosyn [Naproxen] Swelling   Amoxicillin  Rash    Tolerates first and third generation cephalosporins   Azathioprine  Nausea And Vomiting    Severe vomiting   Codeine Nausea And Vomiting   Hydroxychloroquine Hives and Nausea And Vomiting   Mycophenolate Mofetil Nausea Only   Orudis [Ketoprofen] Hives   Sulfa Antibiotics Rash   Sulfathiazole Rash   "

## 2024-09-20 NOTE — H&P (Shared)
 "   Physical Medicine and Rehabilitation Admission H&P    Chief Complaint  Patient presents with   Functional deficits due to Thalamic hemorrhage and recent admission for Flu, w/PNA as well as fall with lumbar compression Fx.    HPI:  Kim Santiago is an 89 year old female with history of HTN, Sjogren's, cutaneous lupus, CVA with mild left sided weakness, Left Breast CA, thyroid  CA, CKD, recent admissions for Flu w/PNA  12/22-24 and readmission 12/31-01/06 after fall with L4  compression Fx (declined Kyphoplasty and tx w/brace per Dr. Penne Sharps) who was readmitted on 09/14/24 with left sided weakness and found to have right thalamic hemorrhage with mild edema.  Family initially requested comfort care therefore Palliative care consulted. As patient was showing improvement by 01/09, family elected on treating the treatable, no further testing or aggressive measures as well as short term rehab.  Repeat CT head showed slight decrease in size of bleed with stable edema. PT/OT/ST was consulted tor evaluations and patient noted to have mild oropharyngeal dysphagia with increased time for mastication and some pocketing requiring aspiration precautions, LLE weakness, left inattention, severe LUE ataxia with mild apraxia, strong left lean and cues for posture. Has back brace for comfort and her TLSO was changed to LSO due to DTI with skin breakdown. She requires min to mod assist with mobility and mod assist with ADL tasks.   She was independent with use of rollator prior to December admission and d/c to SNF for rehab recently. She has been living with family for the past 5 year. Family plans on providing assistance after discharge. CIR recommended due to functional decline.    Review of Systems  HENT:  Positive for hearing loss.   Eyes:  Negative for blurred vision and double vision.  Respiratory:  Negative for cough and shortness of breath.   Cardiovascular:  Negative for chest pain.   Gastrointestinal:  Positive for constipation.  Genitourinary:  Negative for dysuria.       Hot urine at times  Musculoskeletal:  Positive for back pain and joint pain.  Neurological:  Positive for weakness and headaches (since the bleed).  Psychiatric/Behavioral:  The patient does not have insomnia.     Past Medical History:  Diagnosis Date   Arthritis    Breast cancer (HCC) 2017   left mastectomy done 11/2015   Breast cancer in female [facility]) 11/18/2015   Left: 3.9 cm tumor, T2, 1/2 sentinel nodes positive for macro metastatic disease, N1, 3 negative nodes in the axillary tail, ER+,PR+, Her 2 neu, low Mammoprint score   Cancer (HCC)    thyroid  takes levothyroxine    CAP (community acquired pneumonia) 05/05/2021   Chronic kidney disease    UTI   Genetic screening 11/2015   Mammoprint of left breast cancer: Low risk for recurrence.    History of heart attack 06/12/2014   Hypertension    hypothyroidism    secondary to thyroidectomy for thyroid  ca   Hypothyroidism    Lupus    subcutaneous   Menopause 40s   natural, hot flashes and mood lability now gone, off prempro 7 months   Myocardial infarction [facility]) 2013   Osteoporosis    Osteopenia   Rosacea    Sjoegren syndrome    Stress-induced cardiomyopathy September of 2013   EF 35%. Peak troponin was 1.8.   Stroke [facility] 10/2018    Past Surgical History:  Procedure Laterality Date   BACK SURGERY     BREAST BIOPSY  Left 10/30/15   positive, done in Dr. Fredirick office   CARDIAC CATHETERIZATION  05/2012   [facility]. No significant CAD. Ejection fraction of 35% due to stress-induced cardiomyopathy.   CHOLECYSTECTOMY     COLONOSCOPY     DILATION AND CURETTAGE OF UTERUS     KYPHOSIS SURGERY  Feb 2008   L1, Dr. Kathi   LUMBAR DISC SURGERY     L4-L5   MASTECTOMY Left 11/18/2015   positive   SENTINEL NODE BIOPSY Left 11/18/2015   Procedure: SENTINEL NODE BIOPSY;  Surgeon: Reyes LELON Cota, MD;  Location: [facility] ORS;  Service:  General;  Laterality: Left;   SHOULDER ARTHROSCOPY  2004   Left, Dr. Maryl   SIMPLE MASTECTOMY WITH AXILLARY SENTINEL NODE BIOPSY Left 11/18/2015   Procedure: SIMPLE MASTECTOMY;  Surgeon: Reyes LELON Cota, MD;  Location: [facility] ORS;  Service: General;  Laterality: Left;   SPINE SURGERY     L4-5 diskectomy   THYROIDECTOMY     Thyroid  Cancer   TONSILLECTOMY     TUBAL LIGATION      Family History  Problem Relation Age of Onset   Kidney disease Mother    Heart disease Mother    COPD Father    Cancer Father        esophageal   Kidney disease Sister    Cancer Brother 48       colon cancer (both brothers)   Cancer Brother    Heart attack Brother 48   Heart disease Brother    Breast cancer Neg Hx     Social History:  Widowed. Worked as a Publishing Rights Manager for many years.  Has been living with daughter and SIL for the past 5 years. She reports that she has never smoked. She has never used smokeless tobacco. She reports that she does not drink alcohol  and does not use drugs.   Allergies: Allergies[1]   Medications Prior to Admission  Medication Sig Dispense Refill   acetaminophen  (TYLENOL ) 500 MG tablet Take 500 mg by mouth every 4 (four) hours as needed.     atorvastatin  (LIPITOR) 20 MG tablet TAKE 1 TABLET BY MOUTH EVERY DAY AT 6 PM 90 tablet 3   folic acid  (FOLVITE ) 1 MG tablet Take 3 mg by mouth daily.     furosemide  (LASIX ) 20 MG tablet TAKE 1 TABLET BY MOUTH EVERY OTHER DAY AS NEEDED 45 tablet 2   levothyroxine  (SYNTHROID ) 75 MCG tablet TAKE 1 TABLET BY MOUTH (75MCG TOTAL) DAILY 90 tablet 2   losartan  (COZAAR ) 25 MG tablet Take 25 mg by mouth daily.     methotrexate  (RHEUMATREX) 2.5 MG tablet Take 20 mg by mouth once a week. Caution:Chemotherapy. Protect from light.     metoprolol  succinate (TOPROL -XL) 25 MG 24 hr tablet TAKE 1 TABLET BY MOUTH EVERY DAY 90 tablet 3   omeprazole  (PRILOSEC) 20 MG capsule Take 1 capsule (20 mg total) by mouth 2 (two) times daily as needed.  (Patient taking differently: Take 20 mg by mouth 2 (two) times daily.)     ondansetron  (ZOFRAN ) 4 MG tablet Take 1 tablet (4 mg total) by mouth every 6 (six) hours. (Patient taking differently: Take 4 mg by mouth every 6 (six) hours as needed.) 20 tablet 0   polyethylene glycol (MIRALAX  / GLYCOLAX ) packet Take 17 g by mouth daily.     predniSONE  (DELTASONE ) 2.5 MG tablet Take 2.5 mg by mouth daily.     Probiotic Product (PROBIOTIC PO) Take 1 tablet by mouth  daily.     sertraline  (ZOLOFT ) 25 MG tablet TAKE 1 TABLET (25 MG TOTAL) BY MOUTH DAILY. 90 tablet 1   tiZANidine  (ZANAFLEX ) 2 MG tablet Take 1 tablet (2 mg total) by mouth every 8 (eight) hours as needed for muscle spasms. 30 tablet 0   Calcium  Carbonate-Vitamin D  600-200 MG-UNIT TABS Take 2 tablets by mouth daily.     CRANBERRY PO Take 1 capsule by mouth 2 (two) times daily.     fluticasone  (FLONASE ) 50 MCG/ACT nasal spray Place 1 spray into both nostrils daily as needed for allergies. (Patient not taking: Reported on 09/14/2024)     SODIUM FLUORIDE 5000 PPM 1.1 % PSTE See admin instructions. (Patient not taking: Reported on 09/14/2024)     triamcinolone  cream (KENALOG ) 0.1 % APPLY TO AFFECTED AREA TWICE A DAY (Patient not taking: Reported on 09/14/2024)     [DISCONTINUED] oxyCODONE  (OXY IR/ROXICODONE ) 5 MG immediate release tablet Take 1 tablet (5 mg total) by mouth every 8 (eight) hours as needed for moderate pain (pain score 4-6). (Patient not taking: Reported on 09/14/2024) 9 tablet 0     Home: Home Living Family/patient expects to be discharged to:: Private residence Living Arrangements: Children Available Help at Discharge: Family, Available PRN/intermittently Type of Home: House Home Access: Level entry Home Layout: Two level, Able to live on main level with bedroom/bathroom Bathroom Shower/Tub: Health Visitor: Standard Home Equipment: Toilet riser, Rollator (4 wheels), BSC/3in1, Grab bars - tub/shower, Agricultural Consultant (2  wheels), Shower seat - built in Additional Comments: Pt has been living with daughter and SIL, though per chart, family has been concerned about pt's decline over the last year.   Functional History: Prior Function Prior Level of Function : Needs assist Mobility Comments: mod I with rollator, fall prior to admission. ADLs Comments: admit from [facility]; needing assist for ADL's and IADL's. normally mod I at baseline with PRN care from daughter for IADL's  Functional Status:  Mobility: Bed Mobility Overal bed mobility: Needs Assistance Bed Mobility: Supine to Sit Sidelying to sit: Min assist, Mod assist Supine to sit: Contact guard, HOB elevated, Used rails General bed mobility comments: OOB pre/post tx Transfers Overall transfer level: Needs assistance Equipment used: Rolling walker (2 wheels), 1 person hand held assist Transfers: Sit to/from Stand, Bed to chair/wheelchair/BSC Sit to Stand: Mod assist Bed to/from chair/wheelchair/BSC transfer type:: Stand pivot Stand pivot transfers: Mod assist General transfer comment: cues for hand placement, tactile cues for LUE management Ambulation/Gait Ambulation/Gait assistance: Min assist, Mod assist, +2 physical assistance Gait Distance (Feet): 40 Feet Assistive device: 2 person hand held assist Gait Pattern/deviations: Decreased step length - right, Decreased step length - left, Decreased stride length, Decreased stance time - left General Gait Details: able to walk with mod a x 1 along rail then progress to mod a x 2 HHA with cues for proper step pattern, and standing upright due to forward L lean Gait velocity: decreased    ADL: ADL Overall ADL's : Needs assistance/impaired Eating/Feeding: Set up Eating/Feeding Details (indicate cue type and reason): positioning required d/t strong L sided lean Lower Body Dressing: Moderate assistance Lower Body Dressing Details (indicate cue type and reason): able to demo figure 4 position for  attempt to don socks, but required OT assist to don d/t pt requiring mod A to maintain sitting balance EOB Toilet Transfer: Moderate assistance, Rolling walker (2 wheels) Toilet Transfer Details (indicate cue type and reason): simulated to bedside chair. Functional mobility during ADLs: Moderate  assistance, Rolling walker (2 wheels) General ADL Comments: anticipate max A for ADLs due to significant balance deficits, LUE coordination impairment, L posteriolateral lean  Cognition: Cognition Orientation Level: Oriented X4 Cognition Arousal: Alert Behavior During Therapy: WFL for tasks assessed/performed  Physical Exam: Blood pressure (!) 109/54, pulse 69, temperature 98 F (36.7 C), temperature source Oral, resp. rate 18, height 5' 3 (1.6 m), weight 58.9 kg, SpO2 100%. Physical Exam Vitals and nursing note reviewed.  Constitutional:      Appearance: Normal appearance.  Neurological:     Mental Status: She is alert and oriented to person, place, and time.     Comments: Mild left facial droop. Able to answer most orientation questions--had a little difficulty  naming the hospital. She is able to follow simple one and two step commands. LUE with significant ataxia.  Sensory deficits LUE/LLE.      No results found for this or any previous visit (from the past 48 hours). No results found.    Blood pressure (!) 109/54, pulse 69, temperature 98 F (36.7 C), temperature source Oral, resp. rate 18, height 5' 3 (1.6 m), weight 58.9 kg, SpO2 100%.  Medical Problem List and Plan: 1. Functional deficits secondary to ***  -patient may *** shower  -ELOS/Goals: *** 2.  Antithrombotics: -DVT/anticoagulation:  Pharmaceutical: Lovenox   -antiplatelet therapy: N/A due to thalamic hemorrhage 3. Pain Management: Tylenol  prn mild pain and oxycodone  prn severe pain  --will add K pad for local measures. Voltaren gel to heel  4. Mood/Behavior/Sleep:  LCSW to follow for evaluation and  support.  --Melatonin prn for insomnia  -antipsychotic agents: N/A 5. Neuropsych/cognition: This patient *** capable of making decisions on *** own behalf. 6. Skin/Wound Care: Routine pressure relief measures.   --TLSO d/c due to excessive bruising with use.   -- 7. Fluids/Electrolytes/Nutrition: Monitor I/O. Continue nutritional supplements 8. HTN:  Monitor BP TID--BP has been on low side with drop in 90's last evening.  --Will change Cozaar  to evenings with hold parameters. Monitor for orthostatic symptoms. Encourage fluids.  9.  Chronic hyponatremia: Continue to monitor for stability  --baseline 130 going per chart review 10. L4 Compression Fx: Has spinal stenosis with impingement of L4 and L5 nerves per imaging --Chronic L1 compression Fx s/p kyphoplasty.  LSO was used at [facility] but not being used at the hospital per daughter --Will order for support.  11.  Chronic diastolic HF: Monitor for signs of overload. Check daily weighs.   --Used lasix  prn PTA 12. Hx of Sjogren syndrome/cutaneous Lupus:On low dose prednisone .  --will resume Methotrexate  per recommendations--every Sunday per family --Eye drops qid with lacrilube at nights.   13. Anemia of chronic disease: Baseline Hgb around 9  --Was transfused w/one unit on 12/23 and stable. Monitor for signs of bleeding  --Reactive thrombocytopenia has resolved.  14.H/o Depression: Stable on low dose Zoloft .  15. Hx of Left foot ankle pain: Calcaneal enthesopathy with tendinosis distal Achilles tendon.   16. Hypothyroid s/p thyroidectomy: Continue supplement.    ***  Sharlet GORMAN Schmitz, PA-C 09/20/2024     [1]  Allergies Allergen Reactions   Naprosyn [Naproxen] Swelling   Amoxicillin  Rash    Tolerates first and third generation cephalosporins   Azathioprine  Nausea And Vomiting    Severe vomiting   Codeine Nausea And Vomiting   Hydroxychloroquine Hives and Nausea And Vomiting   Mycophenolate Mofetil Nausea Only   Orudis [Ketoprofen]  Hives   Sulfa Antibiotics Rash   Sulfathiazole Rash   "

## 2024-09-20 NOTE — Progress Notes (Signed)
 "    Raulkar, Sven SQUIBB, MD  Physician Physical Medicine and Rehabilitation   PMR Pre-admission    Signed   Date of Service: 09/20/2024 10:02 AM  Related encounter: ED to Hosp-Admission (Discharged) from 09/14/2024 in [facility] 1C MEDICAL TELEMETRY   Signed     Expand All Collapse All  PMR Admission Coordinator Pre-Admission Assessment   Patient: Kim Santiago is an 89 y.o., female MRN: [id number] DOB: [birth year] Height: 5' 3 (160 cm) Weight: 58.9 kg   Insurance Information HMO:     PPO: yes     PCP:      IPA:      80/20:      OTHER:  PRIMARY: [employer]      Policy#: [id number]      Subscriber: pt CM Name: Madelin      Phone#: [phone]     Fax#: epic access Pre-Cert#: [id number] auth for CIR from [name] with [facility] for admit 1/14 with next review date 1/20.  Updates due weekly and they have epic acces.         Employer:  Benefits:  Phone #: [phone]     Name: n/a Eff. Date: 09/07/24     Deduct: $0      Out of Pocket Max: $3700 (met $0      Life Max: n/a CIR: $275/day for days 1-5      SNF: 20 full days Outpatient:      Co-Pay: $20/visit Home Health: 100%      Co-Pay:  DME: 80%     Co-Pay: 20% Providers:  SECONDARY:       Policy#:      Phone#:    Artist:       Phone#:    The Best Boy for patients in Inpatient Rehabilitation Facilities with attached Privacy Act Statement-Health Care Records was provided and verbally reviewed with: Patient and Family   Emergency Contact Information Contact Information       Name Relation Home Work Union (PO[employer]),Christy Daughter [phone]   [phone]    Con Selinda Silk [phone]   [phone]         Other Contacts   None on File        Current Medical History  Patient Admitting Diagnosis: CVA    History of Present Illness: Pt is a 89 y/o female with PMH of HTN, Sjogren's syndrome, SLE, anxiety/depression who was admitted to [facility] from  short term rehab on 09/14/24 for stroke like symptoms.  Briefly, PTA pt had an admission in late December following a fall, was diagnosed with the flu and d/c'd home.  She represented with ongoing back pain (present since the fall) and was diagnosed with an L4 compression fracture.  She was at that time discharged to STR on 09/12/24 and she developed L sided weakness on 09/14/24.  Labs notable for neutropenia, anemia, hyponatremia.  CT showed small thalamic intracranial hemorrhage and she was started on cleviprex  for tight BP control.  Repeat head CT showed slight decrease in size of bleed without midline shift.  Family did elect for conservative care of CVA without workup.  Pt will continue methotrexate  and prednisone  at discharge for her SLE.  Therapy evaluations completed and pt was recommended for CIR.  Patients medical and functional status supports admission to an inpatient rehabilitation program. Due to multifactorial deficits, continued progress will require a coordinated, team-based approach with physician oversight to achieve meaningful gains in mobility, ADL independence,  and safety.     Complete NIHSS TOTAL: 1   Patient's medical record from [facility] has been reviewed by the rehabilitation admission coordinator and physician.   Past Medical History      Past Medical History:  Diagnosis Date   Arthritis     Breast cancer (HCC) 2017    left mastectomy done 11/2015   Breast cancer in female [facility]) 11/18/2015    Left: 3.9 cm tumor, T2, 1/2 sentinel nodes positive for macro metastatic disease, N1, 3 negative nodes in the axillary tail, ER+,PR+, Her 2 neu, low Mammoprint score   Cancer (HCC)      thyroid  takes levothyroxine    CAP (community acquired pneumonia) 05/05/2021   Chronic kidney disease      UTI   Genetic screening 11/2015    Mammoprint of left breast cancer: Low risk for recurrence.    History of heart attack 06/12/2014   Hypertension     hypothyroidism      secondary to thyroidectomy  for thyroid  ca   Hypothyroidism     Lupus      subcutaneous   Menopause 40s    natural, hot flashes and mood lability now gone, off prempro 7 months   Myocardial infarction [facility]) 2013   Osteoporosis      Osteopenia   Rosacea     Sjoegren syndrome     Stress-induced cardiomyopathy September of 2013    EF 35%. Peak troponin was 1.8.   Stroke [facility]) 10/2018          Has the patient had major surgery during 100 days prior to admission? No   Family History   family history includes COPD in her father; Cancer in her brother and father; Cancer (age of onset: 42) in her brother; Heart attack (age of onset: 34) in her brother; Heart disease in her brother and mother; Kidney disease in her mother and sister.   Current Medications [Current Medications]  [Current Medications]    Current Facility-Administered Medications:    acetaminophen  (TYLENOL ) tablet 650 mg, 650 mg, Oral, Q6H PRN, 650 mg at 09/19/24 0909 **OR** acetaminophen  (TYLENOL ) suppository 650 mg, 650 mg, Rectal, Q6H PRN, Zhang, Ping T, MD   artificial tears ophthalmic solution 1 drop, 1 drop, Both Eyes, QID PRN, Laurita Cort DASEN, MD   atorvastatin  (LIPITOR) tablet 20 mg, 20 mg, Oral, Daily, Amin, Sumayya, MD, 20 mg at 09/19/24 0908   calcium -vitamin D  (OSCAL WITH D) 500-5 MG-MCG per tablet 2 tablet, 2 tablet, Oral, Daily, Amin, Sumayya, MD, 2 tablet at 09/19/24 0908   diphenhydrAMINE  (BENADRYL ) injection 25 mg, 25 mg, Intravenous, Q4H PRN, Laurita Cort T, MD   feeding supplement (ENSURE PLUS HIGH PROTEIN) liquid 237 mL, 237 mL, Oral, BID BM, Amin, Sumayya, MD, 237 mL at 09/19/24 0910   folic acid  (FOLVITE ) tablet 3 mg, 3 mg, Oral, Daily, Amin, Sumayya, MD, 3 mg at 09/19/24 0909   levothyroxine  (SYNTHROID ) tablet 75 mcg, 75 mcg, Oral, Q0600, Kandis Devaughn Sayres, MD, 75 mcg at 09/19/24 0500   losartan  (COZAAR ) tablet 25 mg, 25 mg, Oral, Daily, Amin, Sumayya, MD, 25 mg at 09/19/24 9090   ondansetron  (ZOFRAN -ODT) disintegrating tablet 4  mg, 4 mg, Oral, Q6H PRN **OR** ondansetron  (ZOFRAN ) injection 4 mg, 4 mg, Intravenous, Q6H PRN, Laurita Cort T, MD   oxyCODONE  (Oxy IR/ROXICODONE ) immediate release tablet 5 mg, 5 mg, Oral, Q4H PRN, Amin, Sumayya, MD, 5 mg at 09/19/24 1223   pantoprazole  (PROTONIX ) EC tablet 40 mg, 40 mg, Oral, Daily,  Amin, Sumayya, MD, 40 mg at 09/19/24 9090   polyethylene glycol (MIRALAX  / GLYCOLAX ) packet 17 g, 17 g, Oral, Daily, Wouk, Devaughn Sayres, MD, 17 g at 09/18/24 1024   predniSONE  (DELTASONE ) tablet 2.5 mg, 2.5 mg, Oral, Daily, Amin, Sumayya, MD, 2.5 mg at 09/19/24 0910   senna-docusate (Senokot-S) tablet 1 tablet, 1 tablet, Oral, QHS PRN, Laurita Cort DASEN, MD, 1 tablet at 09/18/24 0059   sertraline  (ZOLOFT ) tablet 25 mg, 25 mg, Oral, Daily, Amin, Sumayya, MD, 25 mg at 09/19/24 0909    Patients Current Diet:  Diet Order                  Diet regular Room service appropriate? Yes with Assist; Fluid consistency: Thin  Diet effective now                         Precautions / Restrictions Precautions Precautions: Fall Spinal Brace: Other (comment) (LSO) Spinal Brace Comments: for comfort for L4 fx Restrictions Weight Bearing Restrictions Per Provider Order: No    Has the patient had 2 or more falls or a fall with injury in the past year? Yes   Prior Activity Level Limited Community (1-2x/wk): mod I at baseline, assist for iADLS   Prior Functional Level Self Care: Did the patient need help bathing, dressing, using the toilet or eating? Independent   Indoor Mobility: Did the patient need assistance with walking from room to room (with or without device)? Independent   Stairs: Did the patient need assistance with internal or external stairs (with or without device)? Independent   Functional Cognition: Did the patient need help planning regular tasks such as shopping or remembering to take medications? Needed some help   Patient Information Are you of Hispanic, Latino/a,or Spanish origin?:  A. No, not of Hispanic, Latino/a, or Spanish origin What is your race?: A. White Do you need or want an interpreter to communicate with a doctor or health care staff?: 0. No   Patient's Response To:  Health Literacy and Transportation Is the patient able to respond to health literacy and transportation needs?: Yes Health Literacy - How often do you need to have someone help you when you read instructions, pamphlets, or other written material from your doctor or pharmacy?: Never In the past 12 months, has lack of transportation kept you from medical appointments or from getting medications?: No In the past 12 months, has lack of transportation kept you from meetings, work, or from getting things needed for daily living?: No   Journalist, Newspaper / Equipment Home Equipment: Toilet riser, Rollator (4 wheels), BSC/3in1, Grab bars - tub/shower, Agricultural Consultant (2 wheels), Shower seat - built in   Prior Device Use: Indicate devices/aids used by the patient prior to current illness, exacerbation or injury? Walker   Current Functional Level Cognition   Orientation Level: Oriented X4    Extremity Assessment (includes Sensation/Coordination)   Upper Extremity Assessment: Overall WFL for tasks assessed  Lower Extremity Assessment: Defer to PT evaluation     ADLs   Overall ADL's : Needs assistance/impaired Eating/Feeding: Set up Eating/Feeding Details (indicate cue type and reason): positioning required d/t strong L sided lean Lower Body Dressing: Moderate assistance Lower Body Dressing Details (indicate cue type and reason): able to demo figure 4 position for attempt to don socks, but required OT assist to don d/t pt requiring mod A to maintain sitting balance EOB Toilet Transfer: Moderate assistance, Rolling walker (2 wheels) Toilet  Transfer Details (indicate cue type and reason): simulated to bedside chair. Functional mobility during ADLs: Moderate assistance, Rolling walker (2  wheels) General ADL Comments: anticipate max A for ADLs due to significant balance deficits, LUE coordination impairment, L posteriolateral lean     Mobility   Overal bed mobility: Needs Assistance Bed Mobility: Supine to Sit Sidelying to sit: Min assist, Mod assist Supine to sit: Contact guard, HOB elevated, Used rails General bed mobility comments: much better on L side of bed compared to R yesterday.  encouragement but able to do on her own     Transfers   Overall transfer level: Needs assistance Equipment used: Rolling walker (2 wheels), 1 person hand held assist Transfers: Sit to/from Stand, Bed to chair/wheelchair/BSC Sit to Stand: Mod assist Bed to/from chair/wheelchair/BSC transfer type:: Stand pivot Stand pivot transfers: Mod assist General transfer comment: cues for hand placement, tactile cues for LUE management     Ambulation / Gait / Stairs / Wheelchair Mobility   Ambulation/Gait Ambulation/Gait assistance: Min assist, Mod assist, +2 physical assistance Gait Distance (Feet): 40 Feet Assistive device: 2 person hand held assist Gait Pattern/deviations: Decreased step length - right, Decreased step length - left, Decreased stride length, Decreased stance time - left General Gait Details: able to walk with mod a x 1 along rail then progress to mod a x 2 HHA with cues for proper step pattern, and standing upright due to forward L lean Gait velocity: decreased     Posture / Balance Dynamic Sitting Balance Sitting balance - Comments: significant improvement today and able to maintain upright with R handhold on rail and mod verbal cues to correct. Balance Overall balance assessment: Needs assistance Sitting-balance support: Feet supported, Bilateral upper extremity supported Sitting balance-Leahy Scale: Poor Sitting balance - Comments: significant improvement today and able to maintain upright with R handhold on rail and mod verbal cues to correct. Postural control: Left  lateral lean Standing balance support: Bilateral upper extremity supported, During functional activity, Reliant on assistive device for balance Standing balance-Leahy Scale: Poor Standing balance comment: +1 for static, +2 for dynamic     Special considerations/life events  N/a    Previous Home Environment (from acute therapy documentation) Living Arrangements: Children Available Help at Discharge: Family, Available PRN/intermittently Type of Home: House Care Facility [facility] Home Layout: Two level, Able to live on main level with bedroom/bathroom Home Access: Level entry Bathroom Shower/Tub: Health Visitor: Standard Home Care Services: No Additional Comments: Pt has been living with daughter and SIL, though per chart, family has been concerned about pt's decline over the last year.   Discharge Living Setting Plans for Discharge Living Setting: Lives with (comment) (daughter and SIL) Type of Home at Discharge: House Discharge Home Layout: Able to live on main level with bedroom/bathroom Discharge Home Access: Level entry Discharge Bathroom Shower/Tub: Walk-in shower Discharge Bathroom Toilet: Standard Discharge Bathroom Accessibility: Yes How Accessible: Accessible via wheelchair Does the patient have any problems obtaining your medications?: No   Social/Family/Support Systems Anticipated Caregiver: daughter, Bari Anticipated Caregiver's Contact Information: [phone] Ability/Limitations of Caregiver: none stated Caregiver Availability: 24/7 Discharge Plan Discussed with Primary Caregiver: Yes Is Caregiver In Agreement with Plan?: Yes Does Caregiver/Family have Issues with Lodging/Transportation while Pt is in Rehab?: No   Goals Patient/Family Goal for Rehab: PT/OT/SLP supervision Expected length of stay: 14-16 days Additional Information: Discharge plan: will d/c home with daughter and SIL who can provide 24/7 assist Pt/Family Agrees  to Admission and  willing to participate: Yes Program Orientation Provided & Reviewed with Pt/Caregiver Including Roles  & Responsibilities: Yes   Decrease burden of Care through IP rehab admission: n/a   Possible need for SNF placement upon discharge: Not anticipated.  Plan for discharge home with daughter and SIL as prior to admit.  Family can provide 24/7 care.    Patient Condition: I have reviewed medical records from [facility], spoken with [facility] team, and patient and daughter. I discussed via phone for inpatient rehabilitation assessment.  Patient will benefit from ongoing PT, OT, and SLP, can actively participate in 3 hours of therapy a day 5 days of the week, and can make measurable gains during the admission.  Patient will also benefit from the coordinated team approach during an Inpatient Acute Rehabilitation admission.  The patient will receive intensive therapy as well as Rehabilitation physician, nursing, social worker, and care management interventions.  Due to safety, skin/wound care, disease management, medication administration, pain management, and patient education the patient requires 24 hour a day rehabilitation nursing.  The patient is currently min to mod assist with mobility and basic ADLs.  Discharge setting and therapy post discharge at home with home health is anticipated.  Patient has agreed to participate in the Acute Inpatient Rehabilitation Program and will admit today.   Preadmission Screen Completed By:  Reche FORBES Lowers, PT, DPT 09/19/2024 2:21 PM ______________________________________________________________________   Discussed status with Dr. Lorilee on 09/20/2024  at 10:02 AM  and received approval for admission today.   Admission Coordinator:  Avilyn Virtue E Lilyauna Miedema, PT, DPT time 10:02 AM Pattricia 09/20/2024     Assessment/Plan: Diagnosis: Hemorrhagic CVA Does the need for close, 24 hr/day Medical supervision in concert with the patient's rehab needs make it unreasonable for this  patient to be served in a less intensive setting? Yes Co-Morbidities requiring supervision/potential complications: HTN, Sjogren's syndrome, SLE, anxiety, depression Due to bladder management, bowel management, safety, skin/wound care, disease management, medication administration, pain management, and patient education, does the patient require 24 hr/day rehab nursing? Yes Does the patient require coordinated care of a physician, rehab nurse, PT, OT to address physical and functional deficits in the context of the above medical diagnosis(es)? Yes Addressing deficits in the following areas: balance, endurance, locomotion, strength, transferring, bowel/bladder control, bathing, dressing, feeding, grooming, toileting, and psychosocial support Can the patient actively participate in an intensive therapy program of at least 3 hrs of therapy 5 days a week? Yes The potential for patient to make measurable gains while on inpatient rehab is excellent Anticipated functional outcomes upon discharge from inpatient rehab: MinA PT, supervision OT, modified independent SLP Estimated rehab length of stay to reach the above functional goals is: 12-16 days S to MinA Anticipated discharge destination: Home 10. Overall Rehab/Functional Prognosis: excellent     MD Signature: Sven Lorilee, MD             Revision History  Date/Time User Provider Type Action  09/20/2024 10:11 AM Lorilee, Sven SQUIBB, MD Physician Sign  09/20/2024 10:03 AM Lowers Reche FORBES, PT Rehab Admission Coordinator Share  09/19/2024  2:37 PM Lowers Reche FORBES, PT Rehab Admission Coordinator Share   View Details Report        "

## 2024-09-20 NOTE — Assessment & Plan Note (Signed)
 Continue home Synthroid 

## 2024-09-20 NOTE — Discharge Summary (Signed)
 " Physician Discharge Summary   Patient: Kim Santiago MRN: [id number] DOB: [birth year]  Admit date:     09/14/2024  Discharge date: 09/20/2024  Discharge Physician: Charlie Patterson   PCP: Marylynn Verneita CROME, MD   Recommendations at discharge:    Follow up at acute inpatient rehab Follow up neurosurgery Dr Penne Sharps 2 weeks  Discharge Diagnoses: Principal Problem:   Hemorrhagic stroke [facility]) Active Problems:   Hypertension   Sjogren's syndrome   Hypothyroidism   Dyslipidemia   Lumbar compression fracture (HCC)  Resolved Problems:   * No resolved hospital problems. [facility] Course: Partly taken from prior notes.  Kim Santiago is a 89 y.o. female with medical history significant of HTN, hypothyroidism, HLD, Sjgren's syndrome, SLE, GERD, anxiety/depression, sent from nursing home for evaluation of left-sided weakness.  Patient was recently hospitalized for influenza A pneumonia complicated with CAP and discharged to nursing home 2 days ago.   On presentation vital stable, CT head with hemorrhagic stroke on right side of thalamic region with no significant mass effect or midline shift.  Initially PCCM and neurology was consulted, later family decided to proceed with comfort measures only.  Palliative care was consulted.  1/9: Vital stable, patient with significant improvement in alertness and left-sided weakness.  Family now revoked full comfort care status and would like some workup including PT and OT evaluation with the hope that she can go back to rehab.  They do not want any MRI or CTA or lab workup. Discussed with neurology and a repeat CT head was ordered and if the bleed seems stable she can be evaluated with PT and OT. They do not want any surgical intervention and patient will remain DNR at this time.  1/10: Vital stable, repeat CT head with slightly decreased right thalamic hemorrhage, stable edema and no midline shift.  No new foci of hemorrhage.  PT is  recommending SNF, patient likely will go back on Monday after getting another insurance authorization.  1/11: Remained hemodynamically stable, slowly improving deficit.  Awaiting going back to SNF.  1/13: Remained hemodynamically stable.  No new deficit.  Insurance authorization was obtained for CIR today.  No bed availability.  They will let us  know tomorrow.  1/14.  Acute inpatient rehab accepted patient today.  Assessment and Plan: * Hemorrhagic stroke [facility]) Patient was found to have acute right thalamic hemorrhagic stroke with mild edema or midline shift on initial imaging.  Mentation is at baseline with improving left-sided deficit.  -Repeat CT head seems stable and no new bleed or worsening edema. - family wants to stay conservative and does not want full stroke workup. -Not a candidate for any antiplatelet or blood thinner at this time.  -PT/OT evaluation-recommending CIR and will go today.  Hypertension Blood pressure currently within goal. -Goal blood pressure at 120 due to hemorrhagic stroke. - Continue home losartan  -Holding home metoprolol  as heart rate was mostly in 50s  Sjogren's syndrome History of lupus. - Patient was on weekly methotrexate  and low-dose prednisone  at home -Continuing prednisone  -Will resume weekly methotrexate  on discharge  Hypothyroidism Continue home Synthroid   Dyslipidemia Continue home Crestor  Lumbar compression fracture (HCC) Patient was recently sent to rehab when she was found to have a lumbar compression fracture. No acute concern -PT/OT evaluation -Continue with pain management         Consultants: Neurology, palliative care and critical care team Procedures performed: none  Disposition: acute inpatient rehab Diet recommendation:  regular DISCHARGE MEDICATION:  Allergies as of 09/20/2024       Reactions   Naprosyn [naproxen] Swelling   Amoxicillin  Rash   Tolerates first and third generation cephalosporins    Azathioprine  Nausea And Vomiting   Severe vomiting   Codeine Nausea And Vomiting   Hydroxychloroquine Hives, Nausea And Vomiting   Mycophenolate Mofetil Nausea Only   Orudis [ketoprofen] Hives   Sulfa Antibiotics Rash   Sulfathiazole Rash        Medication List     STOP taking these medications    fluticasone  50 MCG/ACT nasal spray Commonly known as: FLONASE    metoprolol  succinate 25 MG 24 hr tablet Commonly known as: TOPROL -XL   ondansetron  4 MG tablet Commonly known as: ZOFRAN    Sodium Fluoride 5000 PPM 1.1 % Pste Generic drug: Sodium Fluoride   tiZANidine  2 MG tablet Commonly known as: ZANAFLEX    triamcinolone  cream 0.1 % Commonly known as: KENALOG        TAKE these medications    acetaminophen  325 MG tablet Commonly known as: TYLENOL  Take 2 tablets (650 mg total) by mouth every 6 (six) hours as needed for fever (Fever >/= 101). What changed:  medication strength how much to take when to take this reasons to take this   artificial tears ophthalmic solution Place 1 drop into both eyes 4 (four) times daily as needed for dry eyes.   atorvastatin  20 MG tablet Commonly known as: LIPITOR TAKE 1 TABLET BY MOUTH EVERY DAY AT 6 PM   Calcium  Carbonate-Vitamin D  600-200 MG-UNIT Tabs Take 2 tablets by mouth daily.   CRANBERRY PO Take 1 capsule by mouth 2 (two) times daily.   feeding supplement Liqd Take 237 mLs by mouth 2 (two) times daily between meals.   folic acid  1 MG tablet Commonly known as: FOLVITE  Take 3 mg by mouth daily.   furosemide  20 MG tablet Commonly known as: LASIX  TAKE 1 TABLET BY MOUTH EVERY OTHER DAY AS NEEDED   levothyroxine  75 MCG tablet Commonly known as: SYNTHROID  TAKE 1 TABLET BY MOUTH (75MCG TOTAL) DAILY   losartan  25 MG tablet Commonly known as: COZAAR  Take 25 mg by mouth daily.   methotrexate  2.5 MG tablet Commonly known as: RHEUMATREX Take 20 mg by mouth once a week. Caution:Chemotherapy. Protect from light.    omeprazole  20 MG capsule Commonly known as: PRILOSEC Take 1 capsule (20 mg total) by mouth 2 (two) times daily as needed. What changed: when to take this   oxyCODONE  5 MG immediate release tablet Commonly known as: Oxy IR/ROXICODONE  Take 1 tablet (5 mg total) by mouth every 8 (eight) hours as needed for moderate pain (pain score 4-6).   polyethylene glycol 17 g packet Commonly known as: MIRALAX  / GLYCOLAX  Take 17 g by mouth daily.   predniSONE  2.5 MG tablet Commonly known as: DELTASONE  Take 2.5 mg by mouth daily.   PROBIOTIC PO Take 1 tablet by mouth daily.   sertraline  25 MG tablet Commonly known as: ZOLOFT  TAKE 1 TABLET (25 MG TOTAL) BY MOUTH DAILY.        Follow-up Information     acute inpatient rehab Follow up.          Claudene Penne ORN, MD Follow up in 2 week(s).   Specialty: Neurosurgery Contact information: [address] [place] [address] [phone]                Discharge Exam: Fredricka Weights   09/14/24 1201  Weight: 58.9 kg   Physical  Exam HENT:     Head: Normocephalic.  Eyes:     General: Lids are normal.  Cardiovascular:     Rate and Rhythm: Normal rate and regular rhythm.     Heart sounds: Normal heart sounds, S1 normal and S2 normal.  Pulmonary:     Breath sounds: Examination of the right-lower field reveals decreased breath sounds. Examination of the left-lower field reveals decreased breath sounds. Decreased breath sounds present. No wheezing, rhonchi or rales.  Abdominal:     Palpations: Abdomen is soft.     Tenderness: There is no abdominal tenderness.  Musculoskeletal:     Right lower leg: No swelling.     Left lower leg: No swelling.  Skin:    General: Skin is warm.     Comments: Bruising all over her body  Neurological:     Mental Status: She is alert and oriented to person, place, and time.      Condition at discharge: stable  The results of significant diagnostics from this hospitalization  (including imaging, microbiology, ancillary and laboratory) are listed below for reference.   Imaging Studies: CT HEAD WO CONTRAST ( ) Result Date: 09/15/2024 EXAM: CT HEAD WITHOUT CONTRAST 09/15/2024 04:25:18 PM TECHNIQUE: CT of the head was performed without the administration of intravenous contrast. Automated exposure control, iterative reconstruction, and/or weight based adjustment of the mA/kV was utilized to reduce the radiation dose to as low as reasonably achievable. COMPARISON: 09/14/2024 CLINICAL HISTORY: Stroke, follow up. FINDINGS: BRAIN AND VENTRICLES: Right thalamic intraparenchymal hemorrhage measures 1.7 x 1.1 x 1.0 cm on the current study, previously measuring 2.0 x 1.4 x 1.2 cm. Similar degree of surrounding edema. There is no significant midline shift. Similar appearance of the ventricles. The basilar cisterns are patent. No new or enlarging foci of intracranial hemorrhage. Similar moderate chronic microvascular ischemic changes. Similar parenchymal volume loss. Calcific atherosclerosis. No evidence of acute infarct. No hydrocephalus. No extra-axial collection. No mass effect. ORBITS: No acute abnormality. SINUSES: No acute abnormality. SOFT TISSUES AND SKULL: No acute soft tissue abnormality. No skull fracture. IMPRESSION: 1. Right thalamic intraparenchymal hemorrhage, slightly decreased in size compared to the prior study. 2. Similar degree of surrounding edema without significant midline shift. 3. No new or enlarging foci of intracranial hemorrhage. Electronically signed by: Donnice Mania MD MD 09/15/2024 04:50 PM EST RP Workstation: [id number]   CT HEAD CODE STROKE WO CONTRAST Result Date: 09/14/2024 EXAM: CT HEAD WITHOUT CONTRAST 09/14/2024 11:48:52 AM TECHNIQUE: CT of the head was performed without the administration of intravenous contrast. Automated exposure control, iterative reconstruction, and/or weight based adjustment of the mA/kV was utilized to reduce the radiation dose to as  low as reasonably achievable. COMPARISON: Brain MRI 11/07/2018 and CT head 08/28/2024. CLINICAL HISTORY: 89 year old female. Increasing left side weakness, superimposed on residual left side deficit from stroke. FINDINGS: BRAIN AND VENTRICLES: Hyperdense acute hemorrhage in the right deep gray nuclei centered along the anterolateral thalamus near the posterior limb of the right internal capsule (series 3 image 16). Blood products are biconvex, measuring 20 x 14 x 12 mm, estimated volume 2 mL. Mild regional edema. No significant intracranial mass effect, no midline shift. Clinically advanced cerebral white matter hypodensity elsewhere appears stable. No other acute intracranial hemorrhage. Calcified atherosclerosis at the skull base. No suspicious intracranial vascular hyperdensity. No hydrocephalus. No extra-axial collection. ORBITS: No acute abnormality. SINUSES: Paranasal sinuses, tympanic cavities and mastoids remain well aerated. SOFT TISSUES AND SKULL: No acute soft tissue abnormality. No skull fracture. IMPRESSION: 1. Acute  Right thalamic hemorrhage (estimated 2 mL) with mild edema. No significant mass effect, no midline shift. No other acute intracranial abnormality. 2. These results were communicated to Dr. Lindzen at 1158 hours on 09/14/2024 by text page via the [facility] messaging system. Electronically signed by: Helayne Hurst MD MD 09/14/2024 11:59 AM EST RP Workstation: [id number]   CT ANKLE LEFT WO CONTRAST Result Date: 09/12/2024 CLINICAL DATA:  Ankle pain. EXAM: CT OF THE LEFT ANKLE WITHOUT CONTRAST TECHNIQUE: Multidetector CT imaging of the left ankle was performed according to the standard protocol. Multiplanar CT image reconstructions were also generated. RADIATION DOSE REDUCTION: This exam was performed according to the departmental dose-optimization program which includes automated exposure control, adjustment of the mA and/or kV according to patient size and/or use of iterative reconstruction  technique. COMPARISON:  Left ankle radiographs dated 09/11/2024. FINDINGS: Bones/Joint/Cartilage Diffuse osseous demineralization. No acute fracture or dislocation. Ankle mortise appears congruent. No sizable tibiotalar joint effusion. Calcaneal enthesopathy at the insertion of the Achilles tendon. Plantar calcaneal spur. Minimal degenerative changes of the ankle and hindfoot. Ligaments Ligaments are suboptimally evaluated by CT. Muscles and Tendons Fusiform thickening of the distal Achilles tendon is suggestive of tendinosis. Thickening of the central cord of the plantar fascia, acute plantar fasciitis cannot be excluded. Generalized muscular atrophy. Soft tissue No fluid collection or hematoma.  Subcutaneous edema of the heel. IMPRESSION: 1. No acute osseous abnormality. 2. Calcaneal enthesopathy with thickening of the distal Achilles tendon, suggestive of tendinosis. 3. Plantar calcaneal spur with thickening of the central cord of the plantar fascia. Acute plantar fasciitis cannot be excluded. 4. Subcutaneous edema of the heel. Electronically Signed   By: Harrietta Sherry M.D.   On: 09/12/2024 10:30   DG Ankle 2 Views Left Result Date: 09/11/2024 EXAM: 2 VIEW(S) XRAY OF THE LEFT ANKLE 09/11/2024 11:04:00 AM CLINICAL HISTORY: Acute left ankle pain COMPARISON: None available. FINDINGS: BONES AND JOINTS: Achilles insertion enthesophyte with small undersurface calcaneal heel spur. Diffuse osteopenia. No acute fracture or dislocation. No malalignment. SOFT TISSUES: Artifact from overlying sock results in suboptimal evaluation of fine bony detail. Peripheral vascular atherosclerosis. IMPRESSION: 1. Osteopenia. Overlying sock artifact results in suboptimal evaluation of fine bony detail. Otherwise, no acute, displaced fracture or dislocation. Electronically signed by: Rogelia Myers MD 09/11/2024 02:34 PM EST RP Workstation: [id number]   MR LUMBAR SPINE WO CONTRAST Result Date: 09/06/2024 EXAM: MRI LUMBAR SPINE  09/06/2024 12:26:44 PM TECHNIQUE: Multiplanar multisequence MRI of the lumbar spine was performed without the administration of intravenous contrast. COMPARISON: Same day CT of the lumbar spine. CLINICAL HISTORY: Worsening lumbar pain. FINDINGS: BONES AND ALIGNMENT: Normal alignment. Chronic compression deformity of L1 with vertebral augmentation cement noted. Up to 40% height loss of L1 centrally. No significant retropulsion. Irregularity of the L4 superior endplate with associated edema suggestive of acute compression fracture. Approximately 10% height loss of L4. No significant retropulsion. Schmorl nodes at multiple levels. Vertebral body heights otherwise maintained. Heterogeneous bone marrow signal intensity. SPINAL CORD: The conus medullaris extends to the L1-L2 level. SOFT TISSUES: No paraspinal mass. Mild atrophy of the paraspinal musculature. T12-L1: Small disc bulge. Mild to moderate facet arthrosis. Mild lateral recess narrowing. Mild spinal canal stenosis. No significant foraminal stenosis. L1-L2: Small disc bulge. Mild facet arthrosis. Thickening of the ligamentum flavum. Mild lateral recess narrowing. No significant spinal canal or foraminal stenosis. L2-L3: Diffuse disc bulge. Mild to moderate facet arthrosis. Thickening of the ligamentum flavum. Mild lateral recess narrowing. No significant spinal canal or foraminal stenosis. L3-L4: Diffuse  disc bulge. Moderate facet arthrosis. Thickening of the ligamentum flavum. Lateral recess narrowing. Moderate spinal canal stenosis. Potential impingement of the traversing L4 nerve roots. No significant foraminal stenosis. L4-L5: Mild disc height loss. Diffuse disc bulge. Moderate to severe facet arthrosis. Thickening of the ligamentum flavum. Trace facet effusion on the right. Moderate to severe spinal canal stenosis with impingement of the traversing L5 nerve roots. No significant foraminal stenosis. L5-S1: Disc desiccation. Mild disc height loss posteriorly.  Small disc bulge and central disc protrusion. Right subarticular annular fissure. Moderate facet arthrosis. Thickening of the ligamentum flavum. Mild lateral recess narrowing without significant spinal canal stenosis. No significant foraminal stenosis. IMPRESSION: 1. Acute compression fracture of L4 with approximately 10% height loss and associated edema. No significant retropulsion. 2. Moderate to severe spinal canal stenosis at L4-L5 with impingement of the traversing L5 nerve roots. 3. Additional moderate spinal canal stenosis at L3-4 with impingement of the traversing L4 nerve roots. 4. Chronic compression fracture of L1 status post kyphoplasty. Electronically signed by: Donnice Mania MD 09/06/2024 01:09 PM EST RP Workstation: [id number]   CT L-SPINE NO CHARGE Result Date: 09/06/2024 EXAM: CT OF THE LUMBAR SPINE WITHOUT CONTRAST 09/06/2024 07:24:11 AM TECHNIQUE: CT of the lumbar spine was performed without the administration of intravenous contrast. Multiplanar reformatted images are provided for review. Automated exposure control, iterative reconstruction, and/or weight based adjustment of the mA/kV was utilized to reduce the radiation dose to as low as reasonably achievable. COMPARISON: MRI of the lumbar spine dated 10/15/2006. CLINICAL HISTORY: FINDINGS: BONES AND ALIGNMENT: Vertebral augmentation at L1. There is a mild-to-moderate compression deformity at L1, mildly progressed in the interim. Mild dextroscoliosis. Mild downward bowing of the superior endplate of L4. No acute fracture or suspicious bone lesion. DEGENERATIVE CHANGES: Moderate diffuse chronic degenerative disc disease present at L2-3, L3-4 and L4-5. At L3-4 and L4-5, there is also moderate-to-severe central spinal canal stenosis and bilateral lateral recess stenosis, with likely impingement of the transiting nerves in the lateral recesses bilaterally. SOFT TISSUES: Moderate calcific atheromatous disease within the abdominal aorta.  IMPRESSION: 1. Mild-to-moderate compression deformity at L1, mildly progressed in the interim, status post vertebral augmentation. 2. Mild dextroscoliosis and mild downward bowing of the superior endplate of L4. 3. Moderate-to-severe central spinal canal stenosis and bilateral lateral recess stenosis at L3-4 and L4-5, with likely impingement of the transiting nerves in the lateral recesses bilaterally. 4. Moderate diffuse chronic degenerative disc disease at L2-3, L3-4, and L4-5. Electronically signed by: Evalene Coho MD 09/06/2024 08:07 AM EST RP Workstation: [id number]   CT Renal Stone Study Result Date: 09/06/2024 EXAM: CT UROGRAM 09/06/2024 07:24:11 AM TECHNIQUE: CT of the abdomen and pelvis was performed without the administration of intravenous contrast as per CT urogram protocol. Multiplanar reformatted images as well as MIP urogram images are provided for review. Automated exposure control, iterative reconstruction, and/or weight based adjustment of the mA/kV was utilized to reduce the radiation dose to as low as reasonably achievable. COMPARISON: 02/28/2018 CLINICAL HISTORY: Abdominal/flank pain, stone suspected; severe left sided lower back pain. FINDINGS: LOWER CHEST: Bibasilar peripheral predominant interstitial reticulation noted, likely reflecting sequelae of chronic inflammation. LIVER: The liver is unremarkable. GALLBLADDER AND BILE DUCTS: Status post cholecystectomy. No biliary ductal dilatation. SPLEEN: No acute abnormality. PANCREAS: No acute abnormality. ADRENAL GLANDS: No acute abnormality. KIDNEYS, URETERS AND BLADDER: No stones in the kidneys or ureters. No hydronephrosis. No perinephric or periureteral stranding. Urinary bladder is unremarkable. GI AND BOWEL: Small hiatal hernia. No small bowel dilatation. Liquid stool  with fluid-air levels identified within the colon, which may reflect an underlying diarrheal illness. No signs of bowel wall thickening, inflammation, or distention  noted. The appendix is not completely visualized, identified separate from the right lower quadrant bowel loops. No secondary signs of acute appendicitis. PERITONEUM AND RETROPERITONEUM: No ascites. No free air. VASCULATURE: Aorta is normal in caliber. Aortic atherosclerotic calcification. LYMPH NODES: No lymphadenopathy. REPRODUCTIVE ORGANS: Uterus appears normal. No adnexal mass identified. BONES AND SOFT TISSUES: The bones are diffusely osteopenic. Unchanged cement augmentation of the L1 vertebral compression deformity. New mild superior endplate compression deformity involving the L4 vertebra. This is age indeterminate but is new from 02/28/2018. No focal soft tissue abnormality. IMPRESSION: 1. No nephrolithiasis, obstructive uropathy, or mass. 2. New mild superior endplate compression deformity involving the L4 vertebra, age indeterminate. 3. Liquid stool with fluid-air levels identified within the colon, possibly reflecting an underlying diarrheal illness, without bowel wall thickening, inflammation, or distention. Electronically signed by: Waddell Calk MD 09/06/2024 08:02 AM EST RP Workstation: [id number]   CT HEAD WO CONTRAST ( ) Result Date: 08/28/2024 EXAM: CT HEAD WITHOUT CONTRAST 08/28/2024 10:22:47 AM TECHNIQUE: CT of the head was performed without the administration of intravenous contrast. Automated exposure control, iterative reconstruction, and/or weight based adjustment of the mA/kV was utilized to reduce the radiation dose to as low as reasonably achievable. COMPARISON: 04/25/2024 CLINICAL HISTORY: AMS FINDINGS: BRAIN AND VENTRICLES: No acute hemorrhage. No evidence of acute infarct. No hydrocephalus. No extra-axial collection. No mass effect or midline shift. Age-related cerebral volume loss and moderate to severe periventricular and deep cerebral white matter disease. Moderate calcific atheromatous disease within carotid siphons. ORBITS: No acute abnormality. Status post bilateral lens  replacement. SINUSES: Mild scattered ethmoid sinus mucosal thickening. SOFT TISSUES AND SKULL: No acute soft tissue abnormality. No skull fracture. IMPRESSION: 1. No acute intracranial abnormality. 2. Age-related cerebral volume loss and moderate to severe periventricular and deep cerebral white matter disease. 3. Moderate calcific atherosclerotic disease of the carotid siphons. 4. Mild scattered ethmoid sinus mucosal thickening. Electronically signed by: Evalene Coho MD 08/28/2024 10:47 AM EST RP Workstation: [id number]   DG Chest Port 1 View Result Date: 08/28/2024 CLINICAL DATA:  Possible sepsis. EXAM: PORTABLE CHEST 1 VIEW COMPARISON:  05/13/2021. FINDINGS: The heart is enlarged and the mediastinal contour is within normal limits. There is atherosclerotic calcification of the aorta. Patchy airspace disease is present at the lung bases. No effusion or pneumothorax is seen. No acute osseous abnormality. IMPRESSION: Patchy airspace disease at the lung bases, possible atelectasis or infiltrate. Electronically Signed   By: Leita Waddell M.D.   On: 08/28/2024 10:13    Microbiology: Results for orders placed or performed during the hospital encounter of 09/06/24  Urine Culture     Status: Abnormal   Collection Time: 09/06/24  6:21 AM   Specimen: Urine, Clean Catch  Result Value Ref Range Status   Specimen Description   Final    URINE, CLEAN CATCH Performed at [facility], [address], [place], [place] [address]    Special Requests   Final    NONE Performed at [facility], [address] [place], [place] [address]    Culture MULTIPLE SPECIES PRESENT, SUGGEST RECOLLECTION (A)  Final   Report Status 09/07/2024 FINAL  Final    Labs: CBC: Recent Labs  Lab 09/14/24 1138  WBC 3.5*  NEUTROABS 1.4*  HGB 9.5*  HCT 29.3*  MCV 106.9*  PLT 213   Basic Metabolic Panel: Recent Labs  Lab 09/14/24 1138  NA 130*  K 4.3  CL 95*  CO2 25  GLUCOSE 128*  BUN 14   CREATININE 0.70  CALCIUM  8.9   Liver Function Tests: Recent Labs  Lab 09/14/24 1138  AST 18  ALT <5  ALKPHOS 84  BILITOT 0.7  PROT 6.5  ALBUMIN 3.4*   CBG: Recent Labs  Lab 09/14/24 1138  GLUCAP 131*    Discharge time spent: greater than 30 minutes.  Signed: Charlie Patterson, MD [facility] 09/20/2024 "

## 2024-09-20 NOTE — Progress Notes (Signed)
 Inpatient Rehab Admissions Coordinator:    I have insurance approval and a bed available for pt to admit to CIR today. Dr. Josette in agreement and [facility] aware.  I will notify pt/family and make arrangements.    Reche Lowers, PT, DPT Admissions Coordinator [phone] 09/20/2024 10:01 AM

## 2024-09-20 NOTE — Assessment & Plan Note (Signed)
 History of lupus. - Patient was on weekly methotrexate  and low-dose prednisone  at home -Continuing prednisone  -Will resume weekly methotrexate  on discharge

## 2024-09-21 DIAGNOSIS — I610 Nontraumatic intracerebral hemorrhage in hemisphere, subcortical: Secondary | ICD-10-CM | POA: Diagnosis not present

## 2024-09-21 DIAGNOSIS — G8194 Hemiplegia, unspecified affecting left nondominant side: Secondary | ICD-10-CM | POA: Diagnosis not present

## 2024-09-21 LAB — CBC WITH DIFFERENTIAL/PLATELET
Abs Immature Granulocytes: 0.19 K/uL — ABNORMAL HIGH (ref 0.00–0.07)
Basophils Absolute: 0.1 K/uL (ref 0.0–0.1)
Basophils Relative: 3 %
Eosinophils Absolute: 0.1 K/uL (ref 0.0–0.5)
Eosinophils Relative: 2 %
HCT: 27 % — ABNORMAL LOW (ref 36.0–46.0)
Hemoglobin: 9.4 g/dL — ABNORMAL LOW (ref 12.0–15.0)
Immature Granulocytes: 4 %
Lymphocytes Relative: 43 %
Lymphs Abs: 1.8 K/uL (ref 0.7–4.0)
MCH: 35.3 pg — ABNORMAL HIGH (ref 26.0–34.0)
MCHC: 34.8 g/dL (ref 30.0–36.0)
MCV: 101.5 fL — ABNORMAL HIGH (ref 80.0–100.0)
Monocytes Absolute: 0.5 K/uL (ref 0.1–1.0)
Monocytes Relative: 12 %
Neutro Abs: 1.5 K/uL — ABNORMAL LOW (ref 1.7–7.7)
Neutrophils Relative %: 36 %
Platelets: 159 K/uL (ref 150–400)
RBC: 2.66 MIL/uL — ABNORMAL LOW (ref 3.87–5.11)
RDW: 18 % — ABNORMAL HIGH (ref 11.5–15.5)
Smear Review: NORMAL
WBC: 4.3 K/uL (ref 4.0–10.5)
nRBC: 0 % (ref 0.0–0.2)

## 2024-09-21 LAB — COMPREHENSIVE METABOLIC PANEL WITH GFR
ALT: <5 U/L (ref 0–44)
AST: 16 U/L (ref 15–41)
Albumin: 3.2 g/dL — ABNORMAL LOW (ref 3.5–5.0)
Alkaline Phosphatase: 87 U/L (ref 38–126)
Anion gap: 8 (ref 5–15)
BUN: 19 mg/dL (ref 8–23)
CO2: 26 mmol/L (ref 22–32)
Calcium: 8.6 mg/dL — ABNORMAL LOW (ref 8.9–10.3)
Chloride: 99 mmol/L (ref 98–111)
Creatinine, Ser: 0.75 mg/dL (ref 0.44–1.00)
GFR, Estimated: >60 mL/min
Glucose, Bld: 87 mg/dL (ref 70–99)
Potassium: 4.3 mmol/L (ref 3.5–5.1)
Sodium: 132 mmol/L — ABNORMAL LOW (ref 135–145)
Total Bilirubin: 0.5 mg/dL (ref 0.0–1.2)
Total Protein: 5.7 g/dL — ABNORMAL LOW (ref 6.5–8.1)

## 2024-09-21 NOTE — Progress Notes (Signed)
 Inpatient Rehabilitation  Patient information reviewed and entered into eRehab system by [facility]. Karen Kays., CCC/SLP, PPS Coordinator.  Information including medical coding, functional ability and quality indicators will be reviewed and updated through discharge.

## 2024-09-21 NOTE — Progress Notes (Signed)
 " Inpatient Rehabilitation Care Coordinator Assessment and Plan Patient Details  Name: Kim Santiago MRN: [id number] Date of Birth: 09-03-1935  Today's Date: 09/21/2024  Hospital Problems: Principal Problem:   Thalamic hemorrhage [facility])  Past Medical History:  Past Medical History:  Diagnosis Date   Arthritis    Breast cancer (HCC) 2017   left mastectomy done 11/2015   Breast cancer in female [facility]) 11/18/2015   Left: 3.9 cm tumor, T2, 1/2 sentinel nodes positive for macro metastatic disease, N1, 3 negative nodes in the axillary tail, ER+,PR+, Her 2 neu, low Mammoprint score   Cancer (HCC)    thyroid  takes levothyroxine    CAP (community acquired pneumonia) 05/05/2021   Chronic kidney disease    UTI   Genetic screening 11/2015   Mammoprint of left breast cancer: Low risk for recurrence.    History of heart attack 06/12/2014   Hypertension    hypothyroidism    secondary to thyroidectomy for thyroid  ca   Hypothyroidism    Lupus    subcutaneous   Menopause 40s   natural, hot flashes and mood lability now gone, off prempro 7 months   Myocardial infarction [facility]) 2013   Osteoporosis    Osteopenia   Rosacea    Sjoegren syndrome    Stress-induced cardiomyopathy September of 2013   EF 35%. Peak troponin was 1.8.   Stroke [facility]) 10/2018   Past Surgical History:  Past Surgical History:  Procedure Laterality Date   BACK SURGERY     BREAST BIOPSY Left 10/30/15   positive, done in Dr. Fredirick office   CARDIAC CATHETERIZATION  05/2012   [facility]. No significant CAD. Ejection fraction of 35% due to stress-induced cardiomyopathy.   CHOLECYSTECTOMY     COLONOSCOPY     DILATION AND CURETTAGE OF UTERUS     KYPHOSIS SURGERY  Feb 2008   L1, Dr. Kathi   LUMBAR DISC SURGERY     L4-L5   MASTECTOMY Left 11/18/2015   positive   SENTINEL NODE BIOPSY Left 11/18/2015   Procedure: SENTINEL NODE BIOPSY;  Surgeon: Reyes LELON Cota, MD;  Location: [facility] ORS;  Service: General;  Laterality: Left;    SHOULDER ARTHROSCOPY  2004   Left, Dr. Maryl   SIMPLE MASTECTOMY WITH AXILLARY SENTINEL NODE BIOPSY Left 11/18/2015   Procedure: SIMPLE MASTECTOMY;  Surgeon: Reyes LELON Cota, MD;  Location: [facility] ORS;  Service: General;  Laterality: Left;   SPINE SURGERY     L4-5 diskectomy   THYROIDECTOMY     Thyroid  Cancer   TONSILLECTOMY     TUBAL LIGATION     Social History:  reports that she has never smoked. She has never used smokeless tobacco. She reports that she does not drink alcohol  and does not use drugs.  Family / Support Systems Marital Status: Widow/Widower Patient Roles: Parent, Other (Comment) (retiree) Children: Kim Santiago-daughter [phone] Other Supports: Marrion in-law  [phone] Anticipated Caregiver: Bari and selinda Ability/Limitations of Caregiver: Both are flexible but do work some. Daughter teaches a class at [employer] Caregiver Availability: 24/7 Family Dynamics: Close knit with daughter and son in-law wants to be as independent as she can be when she leaves due to she is used to doing for herself and not others doing for her.  Social History Preferred language: English Religion: Baptist Cultural Background: NA Education: HS Health Literacy - How often do you need to have someone help you when you read instructions, pamphlets, or other written material from your doctor or pharmacy?: Never Writes:  Yes Employment Status: Retired Date Retired/Disabled/Unemployed: Retired Engineer, Civil (consulting) Issues: No issues Guardian/Conservator: According to MD pt is capable of making her own decisions while here. Her daughter-Christy is her POA/HCPOA. Will keep her updated as well if any decisions need to be made while here   Abuse/Neglect Abuse/Neglect Assessment Can Be Completed: Yes Physical Abuse: Denies Verbal Abuse: Denies Sexual Abuse: Denies Exploitation of patient/patient's resources: Denies Self-Neglect: Denies  Patient  response to: Social Isolation - How often do you feel lonely or isolated from those around you?: Never  Emotional Status Pt's affect, behavior and adjustment status: Pt is motivated to do well but her main issue is her nausea and she hopes this can be addressed while here. She was independent prior to admission just did not drive but did everything else. Daughter is present and a strong support Recent Psychosocial Issues: other health issues was at a SNF for two days prior to CVA for deconditioning Psychiatric History: HX-depression/anxiety takes medications for this and finds them helpful. Will continue to assess to see if would benefit from neuro-psych services Substance Abuse History: NA  Patient / Family Perceptions, Expectations & Goals Pt/Family understanding of illness & functional limitations: Pt and daughter can explain her stroke and deficits, she seems to be doing better but is hampered by the nausea she is having. Both have asked MD when rounding to address and he will. Both seem to have a good understanding of her treatment plan moving forward Premorbid pt/family roles/activities: mom, retiree, mother in-law, church member, friend, etc Anticipated changes in roles/activities/participation: resume Pt/family expectations/goals: Pt states:  I hope to feel better and then I know I can do more in therapies.   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  Community Resources [employer]: Other (Comment) ([facility] for two days prior to admission for CVA) Premorbid Home Care/DME Agencies: Other (Comment) (rollator, riser, bsc, rw) Transportation available at discharge: family Is the patient able to respond to transportation needs?: Yes In the past 12 months, has lack of transportation kept you from medical appointments or from getting medications?: No In the past 12 months, has lack of transportation kept you from meetings, work, or from getting things  needed for daily living?: No  Discharge Planning Living Arrangements: Children, Other relatives Support Systems: Children, Other relatives, Friends/neighbors, Church/faith community Type of Residence: Private residence Insurance Resources: [employer] (specify) ([employer]) Financial Resources: Social Security, Family Support Financial Screen Referred: No Living Expenses: Lives with family Money Management: Family Does the patient have any problems obtaining your medications?: No Home Management: family Patient/Family Preliminary Plans: Return home with daughter and son in-law who can provide 24/7 care if needed, both are flexible in their jobs. Aware being evaluated today and goals being set for stay here. Care Coordinator Barriers to Discharge: Insurance for SNF coverage Care Coordinator Anticipated Follow Up Needs: HH/OP  Clinical Impression Pleasant female who is motivated ot do well here and make progress. Hopefully her nausea will subside and she can fully participate in therapies. Her daughter is present and very supportive. Plan is to return home with daughter and son in-law. Will await team's evaluations and work on discharge needs.   Raymonde Asberry MATSU 09/21/2024, 11:27 AM    "

## 2024-09-21 NOTE — Evaluation (Signed)
 Speech Language Pathology Assessment and Plan  Patient Details  Name: ARIELL GUNNELS MRN: [id number] Date of Birth: [birth year]  SLP Diagnosis: Dysphagia;Cognitive Impairments  Rehab Potential: Excellent ELOS: 14-16 days    Today's Date: 09/21/2024 SLP Individual Time: 1000-1100 SLP Individual Time Calculation (min): 60 min   Hospital Problem: Principal Problem:   Thalamic hemorrhage (HCC)  Past Medical History:  Past Medical History:  Diagnosis Date   Arthritis    Breast cancer (HCC) 2017   left mastectomy done 11/2015   Breast cancer in female [facility]) 11/18/2015   Left: 3.9 cm tumor, T2, 1/2 sentinel nodes positive for macro metastatic disease, N1, 3 negative nodes in the axillary tail, ER+,PR+, Her 2 neu, low Mammoprint score   Cancer (HCC)    thyroid  takes levothyroxine    CAP (community acquired pneumonia) 05/05/2021   Chronic kidney disease    UTI   Genetic screening 11/2015   Mammoprint of left breast cancer: Low risk for recurrence.    History of heart attack 06/12/2014   Hypertension    hypothyroidism    secondary to thyroidectomy for thyroid  ca   Hypothyroidism    Lupus    subcutaneous   Menopause 40s   natural, hot flashes and mood lability now gone, off prempro 7 months   Myocardial infarction [facility]) 2013   Osteoporosis    Osteopenia   Rosacea    Sjoegren syndrome    Stress-induced cardiomyopathy September of 2013   EF 35%. Peak troponin was 1.8.   Stroke [facility]) 10/2018   Past Surgical History:  Past Surgical History:  Procedure Laterality Date   BACK SURGERY     BREAST BIOPSY Left 10/30/15   positive, done in Dr. Fredirick office   CARDIAC CATHETERIZATION  05/2012   [facility]. No significant CAD. Ejection fraction of 35% due to stress-induced cardiomyopathy.   CHOLECYSTECTOMY     COLONOSCOPY     DILATION AND CURETTAGE OF UTERUS     KYPHOSIS SURGERY  Feb 2008   L1, Dr. Kathi   LUMBAR DISC SURGERY     L4-L5   MASTECTOMY Left 11/18/2015   positive    SENTINEL NODE BIOPSY Left 11/18/2015   Procedure: SENTINEL NODE BIOPSY;  Surgeon: Reyes LELON Cota, MD;  Location: [facility] ORS;  Service: General;  Laterality: Left;   SHOULDER ARTHROSCOPY  2004   Left, Dr. Maryl   SIMPLE MASTECTOMY WITH AXILLARY SENTINEL NODE BIOPSY Left 11/18/2015   Procedure: SIMPLE MASTECTOMY;  Surgeon: Reyes LELON Cota, MD;  Location: [facility] ORS;  Service: General;  Laterality: Left;   SPINE SURGERY     L4-5 diskectomy   THYROIDECTOMY     Thyroid  Cancer   TONSILLECTOMY     TUBAL LIGATION      Assessment / Plan / Recommendation Clinical Impression HPI:  Pt is a 89 y/o female with PMH of HTN, Sjogren's syndrome, SLE, anxiety/depression who was admitted to [facility] from short term rehab on 09/14/24 for stroke like symptoms.  Briefly, PTA pt had an admission in late December following a fall, was diagnosed with the flu and d/c'd home.  She represented with ongoing back pain (present since the fall) and was diagnosed with an L4 compression fracture.  She was at that time discharged to STR on 09/12/24 and she developed L sided weakness on 09/14/24.  Labs notable for neutropenia, anemia, hyponatremia.  CT showed small thalamic intracranial hemorrhage and she was started on cleviprex  for tight BP control.  Repeat head CT showed slight decrease in size  of bleed without midline shift.  Family did elect for conservative care of CVA without workup.  Pt will continue methotrexate  and prednisone  at discharge for her SLE.  Therapy evaluations completed and pt was recommended for CIR.  Patients medical and functional status supports admission to an inpatient rehabilitation program. Due to multifactorial deficits, continued progress will require a coordinated, team-based approach with physician oversight to achieve meaningful gains in mobility, ADL independence, and safety.   Clinical Impression:  Bedside Swallow Evaluation: A bedside swallow evaluation was completed to assess for s/sx of oropharyngeal  dysphagia. Oral mechanism exam revealed L facial asymmetry and intact oral/facial strength. POs administered included thin liquids, puree and solids. Patient with timely mastication and complete oral clearance. Intermittent s/sx of aspiration present with thin liquids (coughing/throat clears), though no s/sx of aspiration with puree/solids. Recommend regular/thin diet with use of standardized precautions including sitting upright during PO, taking small bites/sips at a slow rate and medications whole in puree. Recommend intermittent supervision during mealtimes. Recommend completion of MBS to assess oropharyngeal swallow mechanism. Cognitive-Linguistic: Patient was evaluated via the Cognistat to assess cognitive linguistic skills. Patient with strengths in orientation, attention, and expressive/receptive language. Patient with mild deficits in problem solving (processing speed) and moderate deficits in delayed recall. Patient lives with daughter and son in law, however independently completes medication management and finances PTA. Patient with difficulty recalling and sequencing recent medical hx, however independently shared biographical information re: family, job and hobbies. Patient is 100% intelligible at the conversational level. Recommend targeting memory and problem solving during inpatient rehabilitation stay.  Pt would benefit from skilled ST services to maximize dysphagia and cognition in order to maximize functional independence at d/c. Anticipate patient will require supervision at d/c and TBD f/u SLP services.    Skilled Therapeutic Interventions          Patient evaluated using a standardized cognitive linguistic assessment and bedside swallow evaluation to assess current cognitive, communicative and swallowing function. See above for details.    SLP Assessment  Patient will need skilled Speech Lanaguage Pathology Services during CIR admission    Recommendations  SLP Diet Recommendations:  Age appropriate regular solids;Thin Liquid Administration via: Cup;Straw Medication Administration: Whole meds with puree Supervision: Intermittent supervision to cue for compensatory strategies Compensations: Slow rate;Small sips/bites Postural Changes and/or Swallow Maneuvers: Out of bed for meals Oral Care Recommendations: Oral care BID Patient destination: Home Follow up Recommendations: Home Health SLP;Outpatient SLP (TBD) Equipment Recommended: None recommended by SLP    SLP Frequency 3 to 5 out of 7 days   SLP Duration  SLP Intensity  SLP Treatment/Interventions 14-16 days  Minumum of 1-2 x/day, 30 to 90 minutes  Cognitive remediation/compensation;Environmental controls;Therapeutic Activities;Patient/family education;Dysphagia/aspiration precaution training;Cueing hierarchy    Pain None reported   SLP Evaluation Cognition Overall Cognitive Status: Impaired/Different from baseline Arousal/Alertness: Awake/alert Orientation Level: Oriented X4 Year: 2026 Month: January Day of Week: Correct Attention: Focused;Sustained Focused Attention: Appears intact Sustained Attention: Appears intact Memory: Impaired Memory Impairment: Decreased short term memory Decreased Short Term Memory: Verbal basic Awareness: Appears intact Problem Solving: Impaired Problem Solving Impairment: Verbal basic;Functional basic  Comprehension Auditory Comprehension Overall Auditory Comprehension: Appears within functional limits for tasks assessed Yes/No Questions: Within Functional Limits Commands: Within Functional Limits Conversation: Complex Expression Expression Primary Mode of Expression: Verbal Verbal Expression Overall Verbal Expression: Appears within functional limits for tasks assessed Initiation: No impairment Repetition: No impairment Naming: No impairment Oral Motor Oral Motor/Sensory Function Overall Oral Motor/Sensory Function: Mild impairment  Facial ROM: Reduced  left;Suspected CN VII (facial) dysfunction Facial Symmetry: Abnormal symmetry left;Suspected CN VII (facial) dysfunction Facial Strength: Within Functional Limits Facial Sensation: Within Functional Limits Lingual ROM: Within Functional Limits Lingual Symmetry: Within Functional Limits Lingual Strength: Within Functional Limits Lingual Sensation: Within Functional Limits Velum: Within Functional Limits Mandible: Within Functional Limits Motor Speech Overall Motor Speech: Appears within functional limits for tasks assessed  Care Tool Care Tool Cognition Ability to hear (with hearing aid or hearing appliances if normally used Ability to hear (with hearing aid or hearing appliances if normally used): 1. Minimal difficulty - difficulty in some environments (e.g. when person speaks softly or setting is noisy)   Expression of Ideas and Wants Expression of Ideas and Wants: 4. Without difficulty (complex and basic) - expresses complex messages without difficulty and with speech that is clear and easy to understand   Understanding Verbal and Non-Verbal Content Understanding Verbal and Non-Verbal Content: 4. Understands (complex and basic) - clear comprehension without cues or repetitions  Memory/Recall Ability Memory/Recall Ability : Current season;That he or she is in a hospital/hospital unit    Bedside Swallowing Assessment General Previous Swallow Assessment: 09/16/24 Diet Prior to this Study: Regular;Thin liquids (Level 0) Respiratory Status: Room air History of Recent Intubation: No Behavior/Cognition: Alert;Cooperative;Pleasant mood Oral Cavity - Dentition: Adequate natural dentition Self-Feeding Abilities: Needs assist;Needs set up Vision: Functional for self-feeding Patient Positioning: Upright in bed Volitional Cough: Strong Volitional Swallow: Able to elicit  Ice Chips Ice chips: Not tested Thin Liquid Thin Liquid: Impaired Pharyngeal  Phase Impairments: Cough -  Immediate;Throat Clearing - Immediate Nectar Thick Nectar Thick Liquid: Not tested Honey Thick Honey Thick Liquid: Not tested Puree Puree: Within functional limits Presentation: Self Fed;Spoon Solid Solid: Within functional limits BSE Assessment Risk for Aspiration Impact on safety and function: Mild aspiration risk Other Related Risk Factors: Deconditioning  Short Term Goals: Week 1: SLP Short Term Goal 1 (Week 1): Patient will participate in MBS to assess oropharyngeal swallowing mechanism SLP Short Term Goal 2 (Week 1): Patient will recall and utilize memory compensatory strategies with min multimodal A SLP Short Term Goal 3 (Week 1): Patient will demonstrate mildly complex problem solving skills in functional tasks given min multimodal a  Refer to Care Plan for Long Term Goals  Recommendations for other services: None   Discharge Criteria: Patient will be discharged from SLP if patient refuses treatment 3 consecutive times without medical reason, if treatment goals not met, if there is a change in medical status, if patient makes no progress towards goals or if patient is discharged from hospital.  The above assessment, treatment plan, treatment alternatives and goals were discussed and mutually agreed upon: by patient and by family  Marian Meneely M.A., CCC-SLP 09/21/2024, 12:02 PM

## 2024-09-21 NOTE — Evaluation (Signed)
 Occupational Therapy Assessment and Plan  Patient Details  Name: Kim Santiago MRN: [id number] Date of Birth: 1935-02-07  OT Diagnosis: abnormal posture, acute pain, cognitive deficits, hemiplegia affecting non-dominant side, lumbago (low back pain), and muscle weakness (generalized) Rehab Potential: Rehab Potential (ACUTE ONLY): Good ELOS: 2-3 weeks   Today's Date: 09/21/2024 OT Individual Time: 9153-9040 OT Individual Time Calculation (min): 73 min     Hospital Problem: Principal Problem:   Thalamic hemorrhage (HCC)   Past Medical History:  Past Medical History:  Diagnosis Date   Arthritis    Breast cancer (HCC) 2017   left mastectomy done 11/2015   Breast cancer in female [facility]) 11/18/2015   Left: 3.9 cm tumor, T2, 1/2 sentinel nodes positive for macro metastatic disease, N1, 3 negative nodes in the axillary tail, ER+,PR+, Her 2 neu, low Mammoprint score   Cancer (HCC)    thyroid  takes levothyroxine    CAP (community acquired pneumonia) 05/05/2021   Chronic kidney disease    UTI   Genetic screening 11/2015   Mammoprint of left breast cancer: Low risk for recurrence.    History of heart attack 06/12/2014   Hypertension    hypothyroidism    secondary to thyroidectomy for thyroid  ca   Hypothyroidism    Lupus    subcutaneous   Menopause 40s   natural, hot flashes and mood lability now gone, off prempro 7 months   Myocardial infarction [facility]) 2013   Osteoporosis    Osteopenia   Rosacea    Sjoegren syndrome    Stress-induced cardiomyopathy September of 2013   EF 35%. Peak troponin was 1.8.   Stroke [facility]) 10/2018   Past Surgical History:  Past Surgical History:  Procedure Laterality Date   BACK SURGERY     BREAST BIOPSY Left 10/30/15   positive, done in Dr. Fredirick office   CARDIAC CATHETERIZATION  05/2012   [facility]. No significant CAD. Ejection fraction of 35% due to stress-induced cardiomyopathy.   CHOLECYSTECTOMY     COLONOSCOPY     DILATION AND CURETTAGE OF  UTERUS     KYPHOSIS SURGERY  Feb 2008   L1, Dr. Kathi   LUMBAR DISC SURGERY     L4-L5   MASTECTOMY Left 11/18/2015   positive   SENTINEL NODE BIOPSY Left 11/18/2015   Procedure: SENTINEL NODE BIOPSY;  Surgeon: Reyes LELON Cota, MD;  Location: [facility] ORS;  Service: General;  Laterality: Left;   SHOULDER ARTHROSCOPY  2004   Left, Dr. Maryl   SIMPLE MASTECTOMY WITH AXILLARY SENTINEL NODE BIOPSY Left 11/18/2015   Procedure: SIMPLE MASTECTOMY;  Surgeon: Reyes LELON Cota, MD;  Location: [facility] ORS;  Service: General;  Laterality: Left;   SPINE SURGERY     L4-5 diskectomy   THYROIDECTOMY     Thyroid  Cancer   TONSILLECTOMY     TUBAL LIGATION      Assessment & Plan Clinical Impression: Kim Santiago is an 89 year old female with history of HTN, Sjogren's, cutaneous lupus, CVA with mild left sided weakness, Left Breast CA, thyroid  CA, CKD, recent admissions for Flu w/PNA  12/22-24 and readmission 12/31-01/06 after fall with L4  compression Fx (declined Kyphoplasty and tx w/brace per Dr. Penne Sharps) who was readmitted on 09/14/24 with left sided weakness and found to have right thalamic hemorrhage with mild edema.  Family initially requested comfort care therefore Palliative care consulted. As patient was showing improvement by 01/09, family elected on treating the treatable, no further testing or aggressive measures as well as  short term rehab.  Repeat CT head showed slight decrease in size of bleed with stable edema. PT/OT/ST was consulted tor evaluations and patient noted to have mild oropharyngeal dysphagia with increased time for mastication and some pocketing requiring aspiration precautions, LLE weakness, left inattention, severe LUE ataxia with mild apraxia, strong left lean and cues for posture. Has back brace for comfort and her TLSO was changed to LSO due to DTI with skin breakdown. She requires min to mod assist with mobility and mod assist with ADL tasks.    She was independent with  use of rollator prior to December admission and d/c to SNF for rehab recently. She has been living with family for the past 5 year. Family plans on providing assistance after discharge. Patient transferred to CIR on 09/20/2024 .    Patient currently requires mod-max with basic self-care skills secondary to muscle weakness, decreased cardiorespiratoy endurance, impaired timing and sequencing, abnormal tone, unbalanced muscle activation, ataxia, decreased coordination, and decreased motor planning, decreased midline orientation, decreased attention to left, and decreased motor planning, decreased initiation, decreased attention, decreased awareness, decreased problem solving, decreased safety awareness, and decreased memory, central origin, and decreased sitting balance, decreased standing balance, decreased postural control, hemiplegia, and decreased balance strategies.  Prior to hospitalization, patient could complete ADLs with modified independent .  Patient will benefit from skilled intervention to decrease level of assist with basic self-care skills and increase independence with basic self-care skills prior to discharge home with care partner.  Anticipate patient will require 24 hour supervision and follow up home health.  OT - End of Session Activity Tolerance: Decreased this session Endurance Deficit: Yes OT Assessment Rehab Potential (ACUTE ONLY): Good OT Barriers to Discharge: None OT Patient demonstrates impairments in the following area(s): Balance;Pain;Perception;Cognition;Safety;Edema;Sensory;Endurance;Skin Integrity;Motor OT Basic ADL's Functional Problem(s): Grooming;Bathing;Dressing;Toileting OT Advanced ADL's Functional Problem(s): None OT Transfers Functional Problem(s): Toilet;Tub/Shower OT Additional Impairment(s): Fuctional Use of Upper Extremity OT Plan OT Intensity: Minimum of 1-2 x/day, 45 to 90 minutes OT Frequency: 5 out of 7 days OT Duration/Estimated Length of Stay: 2-3  weeks OT Treatment/Interventions: Balance/vestibular training;Discharge planning;Functional electrical stimulation;Pain management;Self Care/advanced ADL retraining;Therapeutic Activities;UE/LE Coordination activities;Cognitive remediation/compensation;Disease mangement/prevention;Functional mobility training;Patient/family education;Skin care/wound managment;Therapeutic Exercise;Visual/perceptual remediation/compensation;Community reintegration;DME/adaptive equipment instruction;Neuromuscular re-education;Psychosocial support;Splinting/orthotics;UE/LE Strength taining/ROM;Wheelchair propulsion/positioning OT Self Feeding Anticipated Outcome(s): Set-up OT Basic Self-Care Anticipated Outcome(s): CGA-MIN A OT Toileting Anticipated Outcome(s): CGA-MIN A OT Bathroom Transfers Anticipated Outcome(s): MIN A OT Recommendation Recommendations for Other Services: Therapeutic Recreation consult Therapeutic Recreation Interventions: Pet therapy Patient destination: Home Follow Up Recommendations: Home health OT Equipment Recommended: To be determined   OT Evaluation Precautions/Restrictions  Precautions Precautions: Fall Recall of Precautions/Restrictions: Intact Required Braces or Orthoses: Spinal Brace Spinal Brace: Other (comment) (LSO) Spinal Brace Comments: for comfort for L4 fx Restrictions Weight Bearing Restrictions Per Provider Order: No General Chart Reviewed: Yes Additional Pertinent History: HTN, Sjogren's, cutaneous lupus, CVA with mild left sided weakness, Left Breast CA, thyroid  CA, CKD, recent admissions for Flu w/PNA  12/22-24 and readmission 12/31-01/06 after fall with L4  compression Family/Caregiver Present: Yes (DTR) Pain Pain Assessment Pain Scale: 0-10 Pain Score: 5  Pain Type: Acute pain Pain Location: Back Home Living/Prior Functioning Home Living Family/patient expects to be discharged to:: Private residence Living Arrangements: Children, Other relatives Available  Help at Discharge: Family, Available PRN/intermittently Type of Home: House Home Access: Level entry Home Layout: Two level, Able to live on main level with bedroom/bathroom Bathroom Shower/Tub: Health Visitor: Standard Bathroom Accessibility: Yes Additional Comments:  Pt has been living with daughter and SIL  Lives With: Family IADL History Homemaking Responsibilities: Yes Meal Prep Responsibility: No Laundry Responsibility: Secondary Cleaning Responsibility: Secondary Bill Paying/Finance Responsibility: No Shopping Responsibility: No Child Care Responsibility: No Current License: No Mode of Transportation: Friends, Family Occupation: Retired Prior Function Level of Independence: Independent with basic ADLs, Independent with gait, Independent with transfers (using rollator)  Able to Take Stairs?: Yes Driving: No Vocation: Retired Administrator, Sports Baseline Vision/History: 1 Wears glasses Ability to See in Adequate Light: 0 Adequate Patient Visual Report: No change from baseline Vision Assessment?: No apparent visual deficits Perception  Perception: Impaired Perception-Other Comments: inattention to L hemibody Praxis Praxis: Impaired Praxis Impairment Details: Motor planning Cognition Cognition Overall Cognitive Status: Impaired/Different from baseline Arousal/Alertness: Awake/alert Orientation Level: Person;Place;Situation Person: Oriented Place: Oriented Situation: Oriented Memory: Impaired Memory Impairment: Decreased short term memory Decreased Short Term Memory: Verbal basic Attention: Focused;Sustained Focused Attention: Appears intact Sustained Attention: Appears intact Awareness: Appears intact Problem Solving: Impaired Problem Solving Impairment: Verbal basic;Functional basic Safety/Judgment: Impaired Brief Interview for Mental Status (BIMS) Repetition of Three Words (First Attempt): 3 Temporal Orientation: Year: Correct Temporal Orientation:  Month: Accurate within 5 days Temporal Orientation: Day: Correct Recall: Sock: Yes, no cue required Recall: Blue: Yes, no cue required Recall: Bed: Yes, no cue required BIMS Summary Score: 15 Sensation Sensation Light Touch: Impaired by gross assessment Hot/Cold: Not tested Proprioception: Impaired Detail Proprioception Impaired Details: Impaired LUE;Impaired LLE Stereognosis: Not tested Coordination Gross Motor Movements are Fluid and Coordinated: No Fine Motor Movements are Fluid and Coordinated: No Coordination and Movement Description: L hemiplegia with decreased trunk control and propriocpetion Finger Nose Finger Test: slowed movements with dysmetria Motor  Motor Motor: Hemiplegia;Ataxia;Abnormal postural alignment and control;Motor apraxia Motor - Skilled Clinical Observations: L hemi  Trunk/Postural Assessment  Cervical Assessment Cervical Assessment: Exceptions to [facility] (forward head) Thoracic Assessment Thoracic Assessment: Exceptions to [facility] (rounded shoulders, left lean) Lumbar Assessment Lumbar Assessment: Exceptions to [facility] (posterior pelvic tilt) Postural Control Postural Control: Deficits on evaluation Trunk Control: decreased  Balance Balance Balance Assessed: Yes Static Sitting Balance Static Sitting - Balance Support: Feet supported Static Sitting - Level of Assistance: 4: Min assist Dynamic Sitting Balance Dynamic Sitting - Balance Support: Feet supported Dynamic Sitting - Level of Assistance: 3: Mod assist;2: Max assist Static Standing Balance Static Standing - Balance Support: During functional activity;Bilateral upper extremity supported Static Standing - Level of Assistance: 3: Mod assist Dynamic Standing Balance Dynamic Standing - Balance Support: During functional activity Dynamic Standing - Level of Assistance: 3: Mod assist;2: Max assist Extremity/Trunk Assessment RUE Assessment RUE Assessment: Within Functional Limits LUE Assessment LUE  Assessment: Exceptions to [facility] General Strength Comments: 4-/5 strength, decreased proprioception  Care Tool Care Tool Self Care Eating   Eating Assist Level: Minimal Assistance - Patient > 75%    Oral Care    Oral Care Assist Level: Minimal Assistance - Patient > 75%    Bathing   Body parts bathed by patient: Left arm;Chest;Abdomen;Front perineal area;Face Body parts bathed by helper: Right arm;Buttocks;Right upper leg;Left upper leg;Left lower leg;Right lower leg   Assist Level: Maximal Assistance - Patient 24 - 49%    Upper Body Dressing(including orthotics)   What is the patient wearing?: Pull over shirt;Bra   Assist Level: Minimal Assistance - Patient > 75%    Lower Body Dressing (excluding footwear)   What is the patient wearing?: Pants;Underwear/pull up Assist for lower body dressing: Moderate Assistance - Patient 50 - 74%  Putting on/Taking off footwear   What is the patient wearing?: Socks;Shoes Assist for footwear: Minimal Assistance - Patient > 75%       Care Tool Toileting Toileting activity   Assist for toileting: Maximal Assistance - Patient 25 - 49%     Care Tool Bed Mobility Roll left and right activity   Roll left and right assist level: Minimal Assistance - Patient > 75%    Sit to lying activity   Sit to lying assist level: Moderate Assistance - Patient 50 - 74%    Lying to sitting on side of bed activity   Lying to sitting on side of bed assist level: the ability to move from lying on the back to sitting on the side of the bed with no back support.: Moderate Assistance - Patient 50 - 74%     Care Tool Transfers Sit to stand transfer   Sit to stand assist level: Maximal Assistance - Patient 25 - 49%    Chair/bed transfer   Chair/bed transfer assist level: Maximal Assistance - Patient 25 - 49%     Toilet transfer   Assist Level: Maximal Assistance - Patient 24 - 49%     Care Tool Cognition  Expression of Ideas and Wants Expression of Ideas  and Wants: 4. Without difficulty (complex and basic) - expresses complex messages without difficulty and with speech that is clear and easy to understand  Understanding Verbal and Non-Verbal Content Understanding Verbal and Non-Verbal Content: 4. Understands (complex and basic) - clear comprehension without cues or repetitions   Memory/Recall Ability Memory/Recall Ability : Current season;That he or she is in a hospital/hospital unit   Refer to Care Plan for Long Term Goals  SHORT TERM GOAL WEEK 1 OT Short Term Goal 1 (Week 1): Pt will maintain static sitting balance in preparation for transfers with SUP OT Short Term Goal 2 (Week 1): Pt will complete toilet transfers with MOD A OT Short Term Goal 3 (Week 1): Pt will recall hemi-dressing techniques with MIN questioning cues OT Short Term Goal 4 (Week 1): Pt will position L UE in bed or on tray table with SUP OT Short Term Goal 5 (Week 1): Pt complete LB dressing with MIN A  Recommendations for other services: Therapeutic Recreation  Pet therapy   Skilled Therapeutic Intervention Skilled Therapeutic Interventions/Progress Updates:   1:1 OT evaluation and intervention initiated with skilled education provided on OT role, goals, and POC. Pt received resting in bed with wife present in room. Pt presenting to be in good spirits receptive to skilled OT session reporting 5/10 pain in back- OT offering intermittent rest breaks, repositioning, and therapeutic support to optimize participation in therapy session. Pt completed ADLs at levels listed below this session. Pt fatiguing quickly with ADLs with single episode of emesis following session. Pt would benefit from further OT services in IPR setting. Pt's DTR present throughout evaluation and supportive of her mom's care and therapies. Pt was left resting in bed with call bell in reach, bed alarm on, and all needs met.   ADL ADL Equipment Provided: Reacher Eating: Minimal assistance Where  Assessed-Eating: Bed level Grooming: Minimal assistance;Moderate assistance Where Assessed-Grooming: Edge of bed Upper Body Bathing: Minimal assistance Where Assessed-Upper Body Bathing: Edge of bed Lower Body Bathing: Moderate assistance;Maximal assistance Where Assessed-Lower Body Bathing: Edge of bed Upper Body Dressing: Minimal assistance Where Assessed-Upper Body Dressing: Edge of bed Lower Body Dressing: Moderate assistance Where Assessed-Lower Body Dressing: Edge of bed Toileting: Maximal assistance  Where Assessed-Toileting: Bedside Commode (simulated) Toilet Transfer: Maximal assistance Toilet Transfer Method: Stand pivot Tub/Shower Transfer: Not assessed Film/video Editor: Not assessed Mobility  Bed Mobility Bed Mobility: Rolling Right;Rolling Left;Sit to Supine;Supine to Sit Rolling Right: Minimal Assistance - Patient > 75% Rolling Left: Minimal Assistance - Patient > 75% Supine to Sit: Moderate Assistance - Patient 50-74% Sit to Supine: Moderate Assistance - Patient 50-74% Transfers Sit to Stand: Moderate Assistance - Patient 50-74%;Maximal Assistance - Patient 25-49% Stand to Sit: Moderate Assistance - Patient 50-74%;Maximal Assistance - Patient 25-49%   Discharge Criteria: Patient will be discharged from OT if patient refuses treatment 3 consecutive times without medical reason, if treatment goals not met, if there is a change in medical status, if patient makes no progress towards goals or if patient is discharged from hospital.  The above assessment, treatment plan, treatment alternatives and goals were discussed and mutually agreed upon: by patient and by family  [name] 09/21/2024, 1:04 PM

## 2024-09-21 NOTE — Progress Notes (Signed)
 Inpatient Rehabilitation Center Individual Statement of Services  Patient Name:  Kim Santiago  Date:  09/21/2024  Welcome to the Inpatient Rehabilitation Center.  Our goal is to provide you with an individualized program based on your diagnosis and situation, designed to meet your specific needs.  With this comprehensive rehabilitation program, you will be expected to participate in at least 3 hours of rehabilitation therapies Monday-Friday, with modified therapy programming on the weekends.  Your rehabilitation program will include the following services:  Physical Therapy (PT), Occupational Therapy (OT), Speech Therapy (ST), 24 hour per day rehabilitation nursing, Therapeutic Recreaction (TR), Care Coordinator, Rehabilitation Medicine, Nutrition Services, and Pharmacy Services  Weekly team conferences will be held on Wednesday to discuss your progress.  Your Inpatient Rehabilitation Care Coordinator will talk with you frequently to get your input and to update you on team discussions.  Team conferences with you and your family in attendance may also be held.  Expected length of stay: 2-3 weeks  Overall anticipated outcome: min assist level  Depending on your progress and recovery, your program may change. Your Inpatient Rehabilitation Care Coordinator will coordinate services and will keep you informed of any changes. Your Inpatient Rehabilitation Care Coordinator's name and contact numbers are listed  below.  The following services may also be recommended but are not provided by the Inpatient Rehabilitation Center:   Home Health Rehabiltiation Services Outpatient Rehabilitation Services    Arrangements will be made to provide these services after discharge if needed.  Arrangements include referral to agencies that provide these services.  Your insurance has been verified to be:  [employer] Your primary doctor is:  Verneita Kettering  Pertinent information will be shared with  your doctor and your insurance company.  Inpatient Rehabilitation Care Coordinator:  Rhoda Clement, KEN [phone] or ELIGAH BASQUES  Information discussed with and copy given to patient by: Clement Asberry MATSU, 09/21/2024, 11:28 AM

## 2024-09-21 NOTE — Plan of Care (Signed)
 " Problem: RH Balance Goal: LTG: Patient will maintain dynamic sitting balance (OT) Description: LTG:  Patient will maintain dynamic sitting balance with assistance during activities of daily living (OT) Flowsheets (Taken 09/21/2024 1528) LTG: Pt will maintain dynamic sitting balance during ADLs with: Supervision/Verbal cueing Goal: LTG Patient will maintain dynamic standing with ADLs (OT) Description: LTG:  Patient will maintain dynamic standing balance with assist during activities of daily living (OT)  Flowsheets (Taken 09/21/2024 1528) LTG: Pt will maintain dynamic standing balance during ADLs with: Contact Guard/Touching assist   Problem: Sit to Stand Goal: LTG:  Patient will perform sit to stand in prep for activites of daily living with assistance level (OT) Description: LTG:  Patient will perform sit to stand in prep for activites of daily living with assistance level (OT) Flowsheets (Taken 09/21/2024 1528) LTG: PT will perform sit to stand in prep for activites of daily living with assistance level: Contact Guard/Touching assist   Problem: RH Grooming Goal: LTG Patient will perform grooming w/assist,cues/equip (OT) Description: LTG: Patient will perform grooming with assist, with/without cues using equipment (OT) Flowsheets (Taken 09/21/2024 1528) LTG: Pt will perform grooming with assistance level of: Set up assist    Problem: RH Bathing Goal: LTG Patient will bathe all body parts with assist levels (OT) Description: LTG: Patient will bathe all body parts with assist levels (OT) Flowsheets (Taken 09/21/2024 1528) LTG: Pt will perform bathing with assistance level/cueing: Minimal Assistance - Patient > 75%   Problem: RH Dressing Goal: LTG Patient will perform upper body dressing (OT) Description: LTG Patient will perform upper body dressing with assist, with/without cues (OT). Flowsheets (Taken 09/21/2024 1528) LTG: Pt will perform upper body dressing with assistance level of:  Contact Guard/Touching assist Goal: LTG Patient will perform lower body dressing w/assist (OT) Description: LTG: Patient will perform lower body dressing with assist, with/without cues in positioning using equipment (OT) Flowsheets (Taken 09/21/2024 1528) LTG: Pt will perform lower body dressing with assistance level of: Contact Guard/Touching assist   Problem: RH Toileting Goal: LTG Patient will perform toileting task (3/3 steps) with assistance level (OT) Description: LTG: Patient will perform toileting task (3/3 steps) with assistance level (OT)  Flowsheets (Taken 09/21/2024 1528) LTG: Pt will perform toileting task (3/3 steps) with assistance level: Minimal Assistance - Patient > 75%   Problem: RH Functional Use of Upper Extremity Goal: LTG Patient will use RT/LT upper extremity as a (OT) Description: LTG: Patient will use right/left upper extremity as a stabilizer/gross assist/diminished/nondominant/dominant level with assist, with/without cues during functional activity (OT) Flowsheets (Taken 09/21/2024 1528) LTG: Use of upper extremity in functional activities: LUE as diminished level LTG: Pt will use upper extremity in functional activity with assistance level of: Supervision/Verbal cueing   Problem: RH Toilet Transfers Goal: LTG Patient will perform toilet transfers w/assist (OT) Description: LTG: Patient will perform toilet transfers with assist, with/without cues using equipment (OT) Flowsheets (Taken 09/21/2024 1528) LTG: Pt will perform toilet transfers with assistance level of: Contact Guard/Touching assist   Problem: RH Tub/Shower Transfers Goal: LTG Patient will perform tub/shower transfers w/assist (OT) Description: LTG: Patient will perform tub/shower transfers with assist, with/without cues using equipment (OT) Flowsheets (Taken 09/21/2024 1528) LTG: Pt will perform tub/shower stall transfers with assistance level of: Minimal Assistance - Patient > 75%   Problem: RH  Awareness Goal: LTG: Patient will demonstrate awareness during functional activites type of (OT) Description: LTG: Patient will demonstrate awareness during functional activites type of (OT) Flowsheets (Taken 09/21/2024 1528) Patient will demonstrate  awareness during functional activites type of: Anticipatory LTG: Patient will demonstrate awareness during functional activites type of (OT): Supervision   "

## 2024-09-21 NOTE — Plan of Care (Signed)
" °  Problem: RH Balance Goal: LTG Patient will maintain dynamic sitting balance (PT) Description: LTG:  Patient will maintain dynamic sitting balance with assistance during mobility activities (PT) Flowsheets (Taken 09/21/2024 1406) LTG: Pt will maintain dynamic sitting balance during mobility activities with:: Supervision/Verbal cueing Goal: LTG Patient will maintain dynamic standing balance (PT) Description: LTG:  Patient will maintain dynamic standing balance with assistance during mobility activities (PT) Flowsheets (Taken 09/21/2024 1406) LTG: Pt will maintain dynamic standing balance during mobility activities with:: Contact Guard/Touching assist   Problem: Sit to Stand Goal: LTG:  Patient will perform sit to stand with assistance level (PT) Description: LTG:  Patient will perform sit to stand with assistance level (PT) Flowsheets (Taken 09/21/2024 1406) LTG: PT will perform sit to stand in preparation for functional mobility with assistance level: Contact Guard/Touching assist   Problem: RH Bed Mobility Goal: LTG Patient will perform bed mobility with assist (PT) Description: LTG: Patient will perform bed mobility with assistance, with/without cues (PT). Flowsheets (Taken 09/21/2024 1406) LTG: Pt will perform bed mobility with assistance level of: Supervision/Verbal cueing   Problem: RH Bed to Chair Transfers Goal: LTG Patient will perform bed/chair transfers w/assist (PT) Description: LTG: Patient will perform bed to chair transfers with assistance (PT). Flowsheets (Taken 09/21/2024 1406) LTG: Pt will perform Bed to Chair Transfers with assistance level: Contact Guard/Touching assist   Problem: RH Car Transfers Goal: LTG Patient will perform car transfers with assist (PT) Description: LTG: Patient will perform car transfers with assistance (PT). Flowsheets (Taken 09/21/2024 1406) LTG: Pt will perform car transfers with assist:: Minimal Assistance - Patient > 75%   Problem: RH  Ambulation Goal: LTG Patient will ambulate in controlled environment (PT) Description: LTG: Patient will ambulate in a controlled environment, # of feet with assistance (PT). Flowsheets (Taken 09/21/2024 1406) LTG: Pt will ambulate in controlled environ  assist needed:: Minimal Assistance - Patient > 75% LTG: Ambulation distance in controlled environment: 150' Goal: LTG Patient will ambulate in home environment (PT) Description: LTG: Patient will ambulate in home environment, # of feet with assistance (PT). Flowsheets (Taken 09/21/2024 1406) LTG: Pt will ambulate in home environ  assist needed:: Minimal Assistance - Patient > 75% LTG: Ambulation distance in home environment: 25'   Problem: RH Wheelchair Mobility Goal: LTG Patient will propel w/c in controlled environment (PT) Description: LTG: Patient will propel wheelchair in controlled environment, # of feet with assist (PT) Flowsheets (Taken 09/21/2024 1406) LTG: Pt will propel w/c in controlled environ  assist needed:: Supervision/Verbal cueing LTG: Propel w/c distance in controlled environment: 150' Goal: LTG Patient will propel w/c in home environment (PT) Description: LTG: Patient will propel wheelchair in home environment, # of feet with assistance (PT). Flowsheets (Taken 09/21/2024 1406) LTG: Pt will propel w/c in home environ  assist needed:: Supervision/Verbal cueing LTG: Propel w/c distance in home environment: 25'   Problem: RH Stairs Goal: LTG Patient will ambulate up and down stairs w/assist (PT) Description: LTG: Patient will ambulate up and down # of stairs with assistance (PT) Flowsheets (Taken 09/21/2024 1406) LTG: Pt will ambulate up/down stairs assist needed:: Minimal Assistance - Patient > 75% LTG: Pt will  ambulate up and down number of stairs: at least 4 steps with LRAD   "

## 2024-09-21 NOTE — Evaluation (Signed)
 Physical Therapy Assessment and Plan  Patient Details  Name: Kim Santiago MRN: [id number] Date of Birth: 02/19/1935  PT Diagnosis: Abnormal posture, Abnormality of gait, Cognitive deficits, Hemiparesis non-dominant, Impaired cognition, Impaired sensation, Low back pain, and Muscle weakness Rehab Potential: Good ELOS: 3 weeks   Today's Date: 09/21/2024 PT Individual Time: 1300-1415 PT Individual Time Calculation (min): 75 min    Hospital Problem: Principal Problem:   Thalamic hemorrhage (HCC)   Past Medical History:  Past Medical History:  Diagnosis Date   Arthritis    Breast cancer (HCC) 2017   left mastectomy done 11/2015   Breast cancer in female [facility]) 11/18/2015   Left: 3.9 cm tumor, T2, 1/2 sentinel nodes positive for macro metastatic disease, N1, 3 negative nodes in the axillary tail, ER+,PR+, Her 2 neu, low Mammoprint score   Cancer (HCC)    thyroid  takes levothyroxine    CAP (community acquired pneumonia) 05/05/2021   Chronic kidney disease    UTI   Genetic screening 11/2015   Mammoprint of left breast cancer: Low risk for recurrence.    History of heart attack 06/12/2014   Hypertension    hypothyroidism    secondary to thyroidectomy for thyroid  ca   Hypothyroidism    Lupus    subcutaneous   Menopause 40s   natural, hot flashes and mood lability now gone, off prempro 7 months   Myocardial infarction [facility]) 2013   Osteoporosis    Osteopenia   Rosacea    Sjoegren syndrome    Stress-induced cardiomyopathy September of 2013   EF 35%. Peak troponin was 1.8.   Stroke [facility]) 10/2018   Past Surgical History:  Past Surgical History:  Procedure Laterality Date   BACK SURGERY     BREAST BIOPSY Left 10/30/15   positive, done in Dr. Fredirick office   CARDIAC CATHETERIZATION  05/2012   [facility]. No significant CAD. Ejection fraction of 35% due to stress-induced cardiomyopathy.   CHOLECYSTECTOMY     COLONOSCOPY     DILATION AND CURETTAGE OF UTERUS     KYPHOSIS SURGERY   Feb 2008   L1, Dr. Kathi   LUMBAR DISC SURGERY     L4-L5   MASTECTOMY Left 11/18/2015   positive   SENTINEL NODE BIOPSY Left 11/18/2015   Procedure: SENTINEL NODE BIOPSY;  Surgeon: Reyes LELON Cota, MD;  Location: [facility] ORS;  Service: General;  Laterality: Left;   SHOULDER ARTHROSCOPY  2004   Left, Dr. Maryl   SIMPLE MASTECTOMY WITH AXILLARY SENTINEL NODE BIOPSY Left 11/18/2015   Procedure: SIMPLE MASTECTOMY;  Surgeon: Reyes LELON Cota, MD;  Location: [facility] ORS;  Service: General;  Laterality: Left;   SPINE SURGERY     L4-5 diskectomy   THYROIDECTOMY     Thyroid  Cancer   TONSILLECTOMY     TUBAL LIGATION      Assessment & Plan Clinical Impression: Patient is a 89 year old female with history of HTN, Sjogren's, cutaneous lupus, CVA with mild left sided weakness, Left Breast CA, thyroid  CA, CKD, recent admissions for Flu w/PNA  12/22-24 and readmission 12/31-01/06 after fall with L4  compression Fx (declined Kyphoplasty and tx w/brace per Dr. Penne Sharps) who was readmitted on 09/14/24 with left sided weakness and found to have right thalamic hemorrhage with mild edema.  Family initially requested comfort care therefore Palliative care consulted. As patient was showing improvement by 01/09, family elected on treating the treatable, no further testing or aggressive measures as well as short term rehab.  Repeat CT  head showed slight decrease in size of bleed with stable edema. PT/OT/ST was consulted tor evaluations and patient noted to have mild oropharyngeal dysphagia with increased time for mastication and some pocketing requiring aspiration precautions, LLE weakness, left inattention, severe LUE ataxia with mild apraxia, strong left lean and cues for posture. Has back brace for comfort and her TLSO was changed to LSO due to DTI with skin breakdown. She requires min to mod assist with mobility and mod assist with ADL tasks.    She was independent with use of rollator prior to December  admission and d/c to SNF for rehab recently. She has been living with family for the past 5 year. Family plans on providing assistance after discharge. CIR recommended due to functional decline. Patient transferred to CIR on 09/20/2024 .   Patient currently requires mod/max with mobility secondary to muscle weakness and muscle joint tightness, decreased cardiorespiratoy endurance, impaired timing and sequencing, unbalanced muscle activation, ataxia, and decreased coordination, decreased midline orientation and decreased attention to left, decreased attention, decreased awareness, decreased problem solving, decreased safety awareness, decreased memory, and delayed processing, and decreased sitting balance, decreased standing balance, decreased postural control, hemiplegia, and decreased balance strategies.  Prior to hospitalization, patient was modified independent  with mobility and lived with Family in a House home.  Home access is  Level entry.  Patient will benefit from skilled PT intervention to maximize safe functional mobility, minimize fall risk, and decrease caregiver burden for planned discharge home with 24 hour assist.  Anticipate patient will benefit from follow up [facility] at discharge.  PT - End of Session Activity Tolerance: Tolerates < 10 min activity, no significant change in vital signs Endurance Deficit: Yes PT Assessment Rehab Potential (ACUTE/IP ONLY): Good PT Barriers to Discharge: Decreased caregiver support;Home environment access/layout PT Patient demonstrates impairments in the following area(s): Balance;Endurance;Motor;Pain;Safety;Sensory;Skin Integrity PT Transfers Functional Problem(s): Bed to Chair;Bed Mobility;Car PT Locomotion Functional Problem(s): Ambulation;Wheelchair Mobility;Stairs PT Plan PT Intensity: Minimum of 1-2 x/day ,45 to 90 minutes PT Frequency: 5 out of 7 days PT Duration Estimated Length of Stay: 3 weeks PT Treatment/Interventions: Ambulation/gait  training;Balance/vestibular training;Cognitive remediation/compensation;Community reintegration;Discharge planning;Disease management/prevention;DME/adaptive equipment instruction;Functional electrical stimulation;Functional mobility training;Neuromuscular re-education;Pain management;Patient/family education;Psychosocial support;Skin care/wound management;Splinting/orthotics;Therapeutic Exercise;Stair training;Therapeutic Activities;UE/LE Strength taining/ROM;UE/LE Coordination activities;Visual/perceptual remediation/compensation;Wheelchair propulsion/positioning PT Transfers Anticipated Outcome(s): CGA PT Locomotion Anticipated Outcome(s): minA PT Recommendation Recommendations for Other Services: Neuropsych consult Follow Up Recommendations: Home health PT;24 hour supervision/assistance Patient destination: Home Equipment Recommended: To be determined   PT Evaluation Precautions/Restrictions Precautions Precautions: Fall Recall of Precautions/Restrictions: Intact Precaution/Restrictions Comments: L hemi Required Braces or Orthoses: Spinal Brace Spinal Brace: Other (comment) (LSO) Spinal Brace Comments: for comfort for L4 fx Restrictions Weight Bearing Restrictions Per Provider Order: No Pain Pain Assessment Pain Scale: 0-10 Pain Score: 5  Pain Type: Acute pain Pain Location: Back Pain Interference Pain Interference Pain Effect on Sleep: 0. Does not apply - I have not had any pain or hurting in the past 5 days Pain Interference with Therapy Activities: 2. Occasionally Pain Interference with Day-to-Day Activities: 2. Occasionally Home Living/Prior Functioning Home Living Living Arrangements: Children;Other relatives Available Help at Discharge: Family;Available PRN/intermittently Type of Home: House Home Access: Level entry Home Layout: Two level;Able to live on main level with bedroom/bathroom Bathroom Shower/Tub: Health Visitor: Standard Bathroom  Accessibility: Yes Additional Comments: Pt has been living with daughter and SIL  Lives With: Family Prior Function Level of Independence: Independent with basic ADLs;Independent with gait;Independent with transfers (using rollator)  Able  to Take Stairs?: Yes Driving: No Vocation: Retired Optometrist - History Ability to See in Adequate Light: 0 Adequate Perception Perception: Impaired Preception Impairment Details: Inattention/Neglect Perception-Other Comments: inattention to L hemibody Praxis Praxis: Impaired Praxis Impairment Details: Motor planning  Cognition Overall Cognitive Status: Impaired/Different from baseline Arousal/Alertness: Awake/alert Orientation Level: Oriented X4 Year: 2026 Month: January Day of Week: Correct Attention: Focused;Sustained Focused Attention: Appears intact Sustained Attention: Appears intact Memory: Impaired Memory Impairment: Decreased short term memory Decreased Short Term Memory: Verbal basic Awareness: Appears intact Problem Solving: Impaired Problem Solving Impairment: Verbal basic;Functional basic Safety/Judgment: Impaired Sensation Sensation Light Touch: Impaired by gross assessment Hot/Cold: Not tested Proprioception: Impaired Detail Proprioception Impaired Details: Impaired LUE;Impaired LLE Stereognosis: Not tested Additional Comments: able to detect light touch on L but diminished compared to R side Coordination Gross Motor Movements are Fluid and Coordinated: No Fine Motor Movements are Fluid and Coordinated: No Coordination and Movement Description: L hemiplegia with decreased trunk control and propriocpetion Finger Nose Finger Test: slowed movements with dysmetria Motor  Motor Motor: Hemiplegia;Ataxia;Abnormal postural alignment and control;Motor apraxia Motor - Skilled Clinical Observations: L hemi   Trunk/Postural Assessment  Cervical Assessment Cervical Assessment: Exceptions to [facility] Thoracic  Assessment Thoracic Assessment: Exceptions to [facility] Lumbar Assessment Lumbar Assessment: Exceptions to [facility] Postural Control Postural Control: Deficits on evaluation Trunk Control: L trunk lean in standing > sitting  Balance Balance Balance Assessed: Yes Standardized Balance Assessment Standardized Balance Assessment: Berg Balance Test Berg Balance Test Sit to Stand: Needs moderate or maximal assist to stand Standing Unsupported: Unable to stand 30 seconds unassisted Sitting with Back Unsupported but Feet Supported on Floor or Stool: Able to sit 30 seconds Stand to Sit: Needs assistance to sit Transfers: Needs one person to assist Standing Unsupported with Eyes Closed: Needs help to keep from falling Standing Ubsupported with Feet Together: Needs help to attain position and unable to hold for 15 seconds From Standing, Reach Forward with Outstretched Arm: Loses balance while trying/requires external support From Standing Position, Pick up Object from Floor: Unable to try/needs assist to keep balance From Standing Position, Turn to Look Behind Over each Shoulder: Needs assist to keep from losing balance and falling Turn 360 Degrees: Needs assistance while turning Standing Unsupported, Alternately Place Feet on Step/Stool: Needs assistance to keep from falling or unable to try Standing Unsupported, One Foot in Front: Loses balance while stepping or standing Standing on One Leg: Unable to try or needs assist to prevent fall Total Score: 3 Static Sitting Balance Static Sitting - Balance Support: Feet supported Static Sitting - Level of Assistance: 4: Min assist Dynamic Sitting Balance Dynamic Sitting - Balance Support: Feet supported Dynamic Sitting - Level of Assistance: 3: Mod assist;2: Max assist Static Standing Balance Static Standing - Balance Support: During functional activity;Bilateral upper extremity supported Static Standing - Level of Assistance: 3: Mod assist Dynamic Standing  Balance Dynamic Standing - Balance Support: During functional activity Dynamic Standing - Level of Assistance: 3: Mod assist;2: Max assist Extremity Assessment  RUE Assessment RUE Assessment: Within Functional Limits LUE Assessment LUE Assessment: Exceptions to [facility] General Strength Comments: 4-/5 strength, decreased proprioception RLE Assessment RLE Assessment: Within Functional Limits LLE Assessment LLE Assessment: Exceptions to [facility] LLE Strength LLE Overall Strength: Deficits Left Hip Flexion: 2+/5 Left Hip Extension: 2+/5 Left Hip ABduction: 2+/5 Left Hip ADduction: 2+/5 Left Knee Flexion: 2+/5 Left Knee Extension: 3-/5 Left Ankle Dorsiflexion: 3-/5 Left Ankle Plantar Flexion: 3-/5  Care Tool Care Tool Bed Mobility Roll  left and right activity   Roll left and right assist level: Minimal Assistance - Patient > 75%    Sit to lying activity   Sit to lying assist level: Moderate Assistance - Patient 50 - 74%    Lying to sitting on side of bed activity   Lying to sitting on side of bed assist level: the ability to move from lying on the back to sitting on the side of the bed with no back support.: Moderate Assistance - Patient 50 - 74%     Care Tool Transfers Sit to stand transfer   Sit to stand assist level: Maximal Assistance - Patient 25 - 49%    Chair/bed transfer   Chair/bed transfer assist level: Maximal Assistance - Patient 25 - 49%    Car transfer Car transfer activity did not occur: Safety/medical concerns        Care Tool Locomotion Ambulation   Assist level: Moderate Assistance - Patient 50 - 74% Assistive device: Other (comment) (R hand rail) Max distance: 30'  Walk 10 feet activity   Assist level: Moderate Assistance - Patient - 50 - 74% Assistive device: Other (comment) (R hand rail)   Walk 50 feet with 2 turns activity   Assist level: Maximal Assistance - Patient 25 - 49% Assistive device: Walker-Eva  Walk 150 feet activity Walk 150 feet activity  did not occur: Safety/medical concerns      Walk 10 feet on uneven surfaces activity Walk 10 feet on uneven surfaces activity did not occur: Safety/medical concerns      Stairs Stair activity did not occur: Safety/medical concerns        Walk up/down 1 step activity Walk up/down 1 step or curb (drop down) activity did not occur: Safety/medical concerns      Walk up/down 4 steps activity Walk up/down 4 steps activity did not occur: Safety/medical concerns      Walk up/down 12 steps activity Walk up/down 12 steps activity did not occur: Safety/medical concerns      Pick up small objects from floor Pick up small object from the floor (from standing position) activity did not occur: Safety/medical concerns      Wheelchair Is the patient using a wheelchair?: Yes Type of Wheelchair: Manual   Wheelchair assist level: Total Assistance - Patient < 25% Max wheelchair distance: 30'  Wheel 50 feet with 2 turns activity   Assist Level: Total Assistance - Patient < 25%  Wheel 150 feet activity   Assist Level: Total Assistance - Patient < 25%    Refer to Care Plan for Long Term Goals  SHORT TERM GOAL WEEK 1    Recommendations for other services: Neuropsych  Skilled Therapeutic Intervention Mobility Bed Mobility Bed Mobility: Rolling Right;Rolling Left;Sit to Supine;Supine to Sit Rolling Right: Minimal Assistance - Patient > 75% Rolling Left: Minimal Assistance - Patient > 75% Supine to Sit: Moderate Assistance - Patient 50-74% Sit to Supine: Moderate Assistance - Patient 50-74% Transfers Transfers: Sit to Stand;Stand to Sit;Stand Pivot Transfers Sit to Stand: Moderate Assistance - Patient 50-74%;Maximal Assistance - Patient 25-49% Stand to Sit: Moderate Assistance - Patient 50-74%;Maximal Assistance - Patient 25-49% Stand Pivot Transfers: Maximal Assistance - Patient 25 - 49% Stand Pivot Transfer Details: Verbal cues for technique;Verbal cues for safe use of DME/AE;Manual  facilitation for weight shifting;Verbal cues for precautions/safety;Verbal cues for gait pattern;Verbal cues for sequencing;Tactile cues for posture;Tactile cues for weight shifting Transfer (Assistive device): 1 person hand held assist Locomotion  Gait Ambulation: Yes Gait  Assistance: Moderate Assistance - Patient 50-74% Gait Distance (Feet): 30 Feet Assistive device: Other (Comment) (R hand rail) Gait Assistance Details: Tactile cues for initiation;Tactile cues for sequencing;Tactile cues for posture;Tactile cues for placement;Tactile cues for weight beaing;Verbal cues for technique;Verbal cues for gait pattern;Verbal cues for safe use of DME/AE;Verbal cues for precautions/safety;Manual facilitation for weight shifting Gait Gait: Yes Gait Pattern: Impaired Gait Pattern: Step-through pattern;Decreased stance time - left;Decreased stride length;Decreased dorsiflexion - left;Decreased weight shift to right;Lateral trunk lean to left;Narrow base of support;Poor foot clearance - left Stairs / Additional Locomotion Stairs: No Wheelchair Mobility Wheelchair Mobility: Yes Wheelchair Assistance: Maximal Assistance - Patient 25 - 49% Wheelchair Propulsion: Both upper extremities Wheelchair Parts Management: Needs assistance Distance: 15'  Treatment: Pt finished up on the bedpan to start - pt's daughter at the bedside. Pt agreeable to PT evaluation. Pt very pleasant and cooperative throughout assessment. Pt able to provide PLOF and social factors. Discussed her CVA and typical signs and symptoms to help improve her awareness and insight. Does have dysmetria on her LUE and slowed heel to shin on her LLE. Functional mobility completed as outlined above. Overall needs modA for bed mobility, mod/maxA for transfers, and mod/maxA for gait using R hand rail. Pt needing extended seated rest breaks during treatment for recovery, pt reporting that quick movements can cause sickness. She presents with L sided  weakness, L sensory deficits, L trunk lean in standing, L sided ataxia, and global deconditioning/weakness. Session concluded with patient sitting up in the wheelchair, family updated at bedside.  Instructed pt in results of PT evaluation as detailed above, PT POC, rehab potential, rehab goals, and discharge recommendations.  Discharge Criteria: Patient will be discharged from PT if patient refuses treatment 3 consecutive times without medical reason, if treatment goals not met, if there is a change in medical status, if patient makes no progress towards goals or if patient is discharged from hospital.  The above assessment, treatment plan, treatment alternatives and goals were discussed and mutually agreed upon: by patient and by family  Sherlean SHAUNNA Perks  PT, DPT, CSRS  09/21/2024, 2:05 PM

## 2024-09-21 NOTE — Progress Notes (Signed)
 Orthopedic Tech Progress Note Patient Details:  Kim Santiago Apr 06, 1935 [id number]  Ortho Devices Type of Ortho Device: Lumbar corsett Ortho Device/Splint Location: Back Ortho Device/Splint Interventions: Ordered, Application, Adjustment  Fitted Pt for LSO Brace & left it on while she was seated. Post Interventions Patient Tolerated: Fair Instructions Provided: Adjustment of device, Care of device  [name] 09/21/2024, 2:54 PM

## 2024-09-21 NOTE — Plan of Care (Signed)
" °  Problem: RH Swallowing Goal: LTG Patient will consume least restrictive diet using compensatory strategies with assistance (SLP) Description: LTG:  Patient will consume least restrictive diet using compensatory strategies with assistance (SLP) Flowsheets (Taken 09/21/2024 1203) LTG: Pt Patient will consume least restrictive diet using compensatory strategies with assistance of (SLP): Supervision   Problem: RH Problem Solving Goal: LTG Patient will demonstrate problem solving for (SLP) Description: LTG:  Patient will demonstrate problem solving for basic/complex daily situations with cues  (SLP) Flowsheets (Taken 09/21/2024 1203) LTG: Patient will demonstrate problem solving for (SLP): (mildly complex) Other (comment) LTG Patient will demonstrate problem solving for: Supervision   Problem: RH Memory Goal: LTG Patient will use memory compensatory aids to (SLP) Description: LTG:  Patient will use memory compensatory aids to recall biographical/new, daily complex information with cues (SLP) Flowsheets (Taken 09/21/2024 1203) LTG: Patient will use memory compensatory aids to (SLP): Supervision   "

## 2024-09-21 NOTE — Progress Notes (Signed)
 "                                                        PROGRESS NOTE   Subjective/Complaints:  No issues overnite  Labs reviewed  ROS- + CP, -SOB, + contip , - N/V  Objective:   No results found. Recent Labs    09/21/24 0446  WBC 4.3  HGB 9.4*  HCT 27.0*  PLT 159   Recent Labs    09/21/24 0446  NA 132*  K 4.3  CL 99  CO2 26  GLUCOSE 87  BUN 19  CREATININE 0.75  CALCIUM  8.6*    Intake/Output Summary (Last 24 hours) at 09/21/2024 0919 Last data filed at 09/21/2024 0600 Gross per 24 hour  Intake 540 ml  Output --  Net 540 ml        Physical Exam: Vital Signs Blood pressure (!) 117/59, pulse (!) 104, temperature 97.6 F (36.4 C), temperature source Oral, resp. rate 18, height 5' 3 (1.6 m), weight 54.9 kg, SpO2 99%.   General: No acute distress Mood and affect are appropriate Heart: Regular rate and rhythm no rubs murmurs or extra sounds Lungs: Clear to auscultation, breathing unlabored, no rales or wheezes Abdomen: Positive bowel sounds, soft nontender to palpation, nondistended Extremities: No clubbing, cyanosis, or edema Skin: No evidence of breakdown, no evidence of rash Neurologic: Cranial nerves II through XII intact, motor strength is 5/5 in right and 4/5 left deltoid, bicep, tricep, grip, hip flexor, knee extensors, ankle dorsiflexor and plantar flexor Sensory exam normal sensation to light touch and proprioception in bilateral upper and lower extremities Cerebellar exam dysmetria left finger nose finger  Musculoskeletal: Full range of motion in all 4 extremities. No joint swelling   Assessment/Plan: 1. Functional deficits which require 3+ hours per day of interdisciplinary therapy in a comprehensive inpatient rehab setting. Physiatrist is providing close team supervision and 24 hour management of active medical problems listed below. Physiatrist and rehab team continue to assess barriers to discharge/monitor patient progress toward functional and  medical goals  Care Tool:  Bathing              Bathing assist       Upper Body Dressing/Undressing Upper body dressing        Upper body assist      Lower Body Dressing/Undressing Lower body dressing            Lower body assist       Toileting Toileting    Toileting assist       Transfers Chair/bed transfer  Transfers assist           Locomotion Ambulation   Ambulation assist              Walk 10 feet activity   Assist           Walk 50 feet activity   Assist           Walk 150 feet activity   Assist           Walk 10 feet on uneven surface  activity   Assist           Wheelchair     Assist               Wheelchair 50 feet with  2 turns activity    Assist            Wheelchair 150 feet activity     Assist          Blood pressure (!) 117/59, pulse (!) 104, temperature 97.6 F (36.4 C), temperature source Oral, resp. rate 18, height 5' 3 (1.6 m), weight 54.9 kg, SpO2 99%.  Medical Problem List and Plan: 1. Functional deficits secondary to right thalamic intraparenchymal hemorrhage             -patient may shower             -ELOS/Goals: 12-16 days MinA             Admit to CIR   2.  Antithrombotics: -DVT/anticoagulation:  Pharmaceutical: Lovenox              -antiplatelet therapy: N/A due to thalamic hemorrhage 3. Pain Management: Tylenol  prn mild pain and oxycodone  prn severe pain             --will add K pad for local measures. Voltaren gel to heel   4. Mood/Behavior/Sleep:  LCSW to follow for evaluation and support.             --Melatonin prn for insomnia             -antipsychotic agents: N/A 5. Neuropsych/cognition: This patient is capable of making decisions on her own behalf. 6. Skin/Wound Care: Routine pressure relief measures.              --TLSO d/c due to excessive bruising with use.              -- 7. Fluids/Electrolytes/Nutrition: Monitor I/O. Continue  nutritional supplements 8. Hypotension: decrease cozaar  to 12.5mg  after supper   9.  Chronic hyponatremia: Continue to monitor for stability             --baseline 130 going per chart review 10. L4 Compression Fx: Has spinal stenosis with impingement of L4 and L5 nerves per imaging --Chronic L1 compression Fx s/p kyphoplasty.  LSO was used at [facility] but not being used at the hospital per daughter --Will order for support.    11.  Chronic diastolic HF: Monitor for signs of overload. Check daily weighs.              --Used lasix  prn PTA   12. Hx of Sjogren syndrome/cutaneous Lupus:On low dose prednisone .  --will resume Methotrexate  per recommendations--every Sunday per family --Eye drops qid with lacrilube at nights.   13. Anemia of chronic disease: Baseline Hgb around 9             --Was transfused w/one unit on 12/23 and stable. Monitor for signs of bleeding             --Reactive thrombocytopenia has resolved.       Latest Ref Rng & Units 09/21/2024    4:46 AM 09/14/2024   11:38 AM 09/10/2024    4:48 AM  CBC  WBC 4.0 - 10.5 K/uL 4.3  3.5  2.5   Hemoglobin 12.0 - 15.0 g/dL 9.4  9.5  8.8   Hematocrit 36.0 - 46.0 % 27.0  29.3  25.8   Platelets 150 - 400 K/uL 159  213  187     14.H/o Depression: continue Zoloft.    15. Hx of Left foot ankle pain: Calcaneal enthesopathy with tendinosis distal Achilles tendon.     16 . Hypothyroid s/p thyroidectomy: continue supplement.  LOS: 1 days A FACE TO FACE EVALUATION WAS PERFORMED  Prentice FORBES Compton 09/21/2024, 9:19 AM     "

## 2024-09-22 ENCOUNTER — Inpatient Hospital Stay (HOSPITAL_COMMUNITY)

## 2024-09-22 DIAGNOSIS — G8194 Hemiplegia, unspecified affecting left nondominant side: Secondary | ICD-10-CM | POA: Diagnosis not present

## 2024-09-22 DIAGNOSIS — I610 Nontraumatic intracerebral hemorrhage in hemisphere, subcortical: Secondary | ICD-10-CM | POA: Diagnosis not present

## 2024-09-22 MED ORDER — HYDROCORTISONE (PERIANAL) 2.5 % EX CREA
1.0000 | TOPICAL_CREAM | Freq: Two times a day (BID) | CUTANEOUS | Status: DC
  Administered 2024-09-22 – 2024-10-08 (×25): 1 via RECTAL
  Filled 2024-09-22 (×2): qty 28.35

## 2024-09-22 MED ORDER — LIDOCAINE HCL URETHRAL/MUCOSAL 2 % EX GEL
CUTANEOUS | Status: DC | PRN
  Administered 2024-09-22: 6 via TOPICAL
  Filled 2024-09-22: qty 6

## 2024-09-22 MED ORDER — LIDOCAINE HCL URETHRAL/MUCOSAL 2 % EX GEL: 1.0000 | Freq: Once | CUTANEOUS | Status: DC

## 2024-09-22 MED ORDER — GERHARDT'S BUTT CREAM
TOPICAL_CREAM | Freq: Three times a day (TID) | CUTANEOUS | Status: DC
  Filled 2024-09-22 (×3): qty 60

## 2024-09-22 MED ORDER — SMOG ENEMA
960.0000 mL | Freq: Once | RECTAL | Status: AC
  Administered 2024-09-22: 960 mL via RECTAL
  Filled 2024-09-22: qty 960

## 2024-09-22 MED ORDER — SENNOSIDES-DOCUSATE SODIUM 8.6-50 MG PO TABS
2.0000 | ORAL_TABLET | Freq: Every day | ORAL | Status: DC
  Administered 2024-09-23: 2 via ORAL
  Filled 2024-09-22 (×2): qty 2

## 2024-09-22 NOTE — Progress Notes (Signed)
 Physical Therapy Session Note  Patient Details  Name: Kim Santiago MRN: [id number] Date of Birth: 02-05-1935  Today's Date: 09/22/2024 PT Missed Time: 45 Minutes Missed Time Reason: Other (Comment);Nursing care (receiving enema)  Short Term Goals: Week 1:  PT Short Term Goal 1 (Week 1): Pt will complete bed mobility with minA PT Short Term Goal 2 (Week 1): Pt will complete bed<>chair transfers with modA and LRAD PT Short Term Goal 3 (Week 1): Pt will ambulate 82' with modA and LRAD PT Short Term Goal 4 (Week 1): Pt will improve BERG score by at least 8 points  Skilled Therapeutic Interventions/Progress Updates:       Pt missed 45 minutes of treatment - receiving an enema from nursing.   Therapy Documentation Precautions:  Precautions Precautions: Fall Recall of Precautions/Restrictions: Intact Precaution/Restrictions Comments: L hemi Required Braces or Orthoses: Spinal Brace Spinal Brace: Other (comment) (LSO) Spinal Brace Comments: for comfort for L4 fx Restrictions Weight Bearing Restrictions Per Provider Order: No General:     Therapy/Group: Individual Therapy  Sherlean SHAUNNA Perks 09/22/2024, 7:39 AM

## 2024-09-22 NOTE — Progress Notes (Signed)
 Occupational Therapy Session Note  Patient Details  Name: Kim Santiago MRN: [id number] Date of Birth: Apr 17, 1935  Today's Date: 09/22/2024 OT Individual Time: 8854-8779 OT Individual Time Calculation (min): 35 min    Short Term Goals: Week 1:  OT Short Term Goal 1 (Week 1): Pt will maintain static sitting balance in preparation for transfers with SUP OT Short Term Goal 2 (Week 1): Pt will complete toilet transfers with MOD A OT Short Term Goal 3 (Week 1): Pt will recall hemi-dressing techniques with MIN questioning cues OT Short Term Goal 4 (Week 1): Pt will position L UE in bed or on tray table with SUP OT Short Term Goal 5 (Week 1): Pt complete LB dressing with MIN A  Skilled Therapeutic Interventions/Progress Updates:  Skilled OT session completed to address ADL retraining and functional mobility. Pt received supine in bed, agreeable to participate in therapy. Pt reports 6/10 pain in abdomen/low back d/t constipation, OT provided pain intervention by prompting for rest breaks.   OT discussed missing time from previous OT session d/t nausea, pt still experiencing nausea; however, willing to participate. Supine>EOB with Mod A to support trunk d/t L lateral lean when sitting EOB, Max verbal and tactile cues to correct. Pt requesting to void. OT donned LSO for comfort/pain reduction. Pt completed functional mobility to standard chair with Max A to manage RW and lift L LE. Functional mobility d/c d/t L lateral lean. Standard chair>B-DABSC stand pivot Max A +2. Pt requested to remove back brace d/t abdominal pain. Pt completed toileting with Max A for clothing mgmt and hygiene, pt attempts to assist with clothing mgmt seated with bilateral leans , discontinued d/t safety when leaning to L. DABSC>EOB stand pivot Mod +2 with RW. EOB>Supine with Min A to support BLE. Pt supine in bed with daughter present and all needs within reach.   Therapy Documentation Precautions:  Precautions Precautions:  Fall Recall of Precautions/Restrictions: Intact Precaution/Restrictions Comments: L hemi Required Braces or Orthoses: Spinal Brace Spinal Brace: Other (comment) (LSO) Spinal Brace Comments: for comfort for L4 fx Restrictions Weight Bearing Restrictions Per Provider Order: No    Therapy/Group: Individual Therapy  Othello Sgroi Woods-Chance, MS, OTR/L 09/22/2024, 12:28 PM

## 2024-09-22 NOTE — IPOC Note (Signed)
 Overall Plan of Care [facility]) Patient Details Name: Kim Santiago MRN: [id number] DOB: [birth year]  Admitting Diagnosis: Thalamic hemorrhage [facility])  Hospital Problems: Principal Problem:   Thalamic hemorrhage (HCC)     Functional Problem List: Nursing Bladder, Edema, Endurance, Medication Management, Nutrition, Safety  PT Balance, Endurance, Motor, Pain, Safety, Sensory, Skin Integrity  OT Balance, Pain, Perception, Cognition, Safety, Edema, Sensory, Endurance, Skin Integrity, Motor  SLP Nutrition, Cognition  TR         Basic ADLs: OT Grooming, Bathing, Dressing, Toileting     Advanced  ADLs: OT None     Transfers: PT Bed to Chair, Bed Mobility, Car  OT Toilet, Tub/Shower     Locomotion: PT Ambulation, Psychologist, [employer], Stairs     Additional Impairments: OT Fuctional Use of Upper Extremity  SLP Swallowing, Social Cognition   Problem Solving, Memory  TR      Anticipated Outcomes Item Anticipated Outcome  Self Feeding Set-up  Swallowing  supervisionA   Basic self-care  CGA-MIN A  Toileting  CGA-MIN A   Bathroom Transfers MIN A  Bowel/Bladder  manage bowels with medications/ manage bladder with toileting  assistance  Transfers  CGA  Locomotion  minA  Communication     Cognition  supervisionA  Pain  <4 w/ prns  Safety/Judgment  manag safety with supervision assistance   Therapy Plan: PT Intensity: Minimum of 1-2 x/day ,45 to 90 minutes PT Frequency: 5 out of 7 days PT Duration Estimated Length of Stay: 3 weeks OT Intensity: Minimum of 1-2 x/day, 45 to 90 minutes OT Frequency: 5 out of 7 days OT Duration/Estimated Length of Stay: 2-3 weeks SLP Intensity: Minumum of 1-2 x/day, 30 to 90 minutes SLP Frequency: 3 to 5 out of 7 days SLP Duration/Estimated Length of Stay: 14-16 days   Team Interventions: Nursing Interventions Patient/Family Education, Bladder Management, Bowel Management, Disease Management/Prevention, Pain Management, Medication  Management, Discharge Planning  PT interventions Ambulation/gait training, Balance/vestibular training, Cognitive remediation/compensation, Community reintegration, Discharge planning, Disease management/prevention, DME/adaptive equipment instruction, Functional electrical stimulation, Functional mobility training, Neuromuscular re-education, Pain management, Patient/family education, Psychosocial support, Skin care/wound management, Splinting/orthotics, Therapeutic Exercise, Stair training, Therapeutic Activities, UE/LE Strength taining/ROM, UE/LE Coordination activities, Visual/perceptual remediation/compensation, Wheelchair propulsion/positioning  OT Interventions Balance/vestibular training, Discharge planning, Functional electrical stimulation, Pain management, Self Care/advanced ADL retraining, Therapeutic Activities, UE/LE Coordination activities, Cognitive remediation/compensation, Disease mangement/prevention, Functional mobility training, Patient/family education, Skin care/wound managment, Therapeutic Exercise, Visual/perceptual remediation/compensation, Community reintegration, Fish [employer] Manager, Neuromuscular re-education, Psychosocial support, Splinting/orthotics, UE/LE Strength taining/ROM, Wheelchair propulsion/positioning  SLP Interventions Cognitive remediation/compensation, Environmental controls, Therapeutic Activities, Patient/family education, Dysphagia/aspiration precaution training, Cueing hierarchy  TR Interventions    SW/CM Interventions Discharge Planning, Psychosocial Support, Patient/Family Education   Barriers to Discharge MD  Bowel issues, obstipation  Nursing Decreased caregiver support, Home environment access/layout Discharge: House  Discharge Home Layout: Able to live on main level with bedroom/bathroom  Discharge Home Access: Level entry  PT Decreased caregiver support, Home environment access/layout    OT None    SLP      SW Insurance for SNF  coverage     Team Discharge Planning: Destination: PT-Home ,OT- Home , SLP-Home Projected Follow-up: PT-Home health PT, 24 hour supervision/assistance, OT-  Home health OT, SLP-Home Health SLP, Outpatient SLP (TBD) Projected Equipment Needs: PT-To be determined, OT- To be determined, SLP-None recommended by SLP Equipment Details: PT- , OT-  Patient/family involved in discharge planning: PT- Patient, Family member/caregiver,  OT-Patient, Family member/caregiver, SLP-Patient, Family member/caregiver  MD ELOS:  14-21d  Medical Rehab Prognosis:  Good Assessment: The patient has been admitted for CIR therapies with the diagnosis of CVA. The team will be addressing functional mobility, strength, stamina, balance, safety, adaptive techniques and equipment, self-care, bowel and bladder mgt, patient and caregiver education, pain, bowel regularity. Go[facility] have been [facility] at [facility]. Anticipated discharge destination is home with family support.        See Team Conference Notes for weekly updates to the plan of care

## 2024-09-22 NOTE — Procedures (Signed)
 Modified Barium Swallow Study  Patient Details  Name: Kim Santiago MRN: [id number] Date of Birth: May 20, 1935  Today's Date: 09/22/2024  Modified Barium Swallow completed.  Full report located under Chart Review in the Imaging Section.  History of Present Illness Pt is a 89 y.o. female with medical history significant of HTN, hypothyroidism, HLD, Sjgren's syndrome, SLE, GERD, anxiety/depression, sent from nursing home for evaluation of possible stroke.     Patient was recently hospitalized 2x in Dec. 2025 for influenza A pneumonia complicated with CAP and a Fall w/ compression fracture discharged to nursing home for Rehab.  This morning, patient suddenly developed left-sided weakness around 10:30 AM, and patient sent to ED for further evaluation.  Patient denied any headache vision problems no nauseous vomiting.   ED Course: Afebrile, nontachycardic blood pressure 120/40.  CT head showed hemorrhagic stroke on the right side of thalamic region with no significant mass effect or midline shift.  After discussion with intensivist and neurology, family decided to start patient on comfort measure.  Pt began improving in overall status; repeat MRI on 1/9 revealed: Right thalamic intraparenchymal hemorrhage, slightly decreased in size  compared to the prior study.  2. Similar degree of surrounding edema without significant midline shift.  3. No new or enlarging foci of intracranial hemorrhage..   Clinical Impression Patient presents with a functional oropharyngeal swallow. Oral phase is characterized by reduced bolus prep/mastication resulting in moderately prolonged mastication of solids and requiring multiple swallows/liquid wash for clearance of oral cavity. Pharyngeal phase is remarkable for occasional mistiming of epiglottic inversion/laryngeal vestibular closure resulting in transient penetration of thin liquids. Of note, this is considered WFL. No penetration/aspiration present with remaining  consistencies of NTL, puree, solids and pill. Patient with complete clearance of pharyngeal space. During esophageal sweep, observed suspected slowed esophageal motility requiring additional bites of puree to clear pill into stomach. Recommend continuation of current diet (regular/thin) with medications whole in puree. Patient should utilize liquid wash and sit upright during and after meals.   DIGEST Swallow Severity Rating*  Safety: 0  Efficiency:0  Overall Pharyngeal Swallow Severity: functional  1: mild; 2: moderate; 3: severe; 4: profound  *The Dynamic Imaging Grade of Swallowing Toxicity is standardized for the head and neck cancer population, however, demonstrates promising clinical applications across populations to standardize the clinical rating of pharyngeal swallow safety and severity.   Swallow Evaluation Recommendations Recommendations: PO diet PO Diet Recommendation: Regular;Thin liquids (Level 0) Liquid Administration via: Cup;Straw Medication Administration: Whole meds with puree Supervision: Patient able to self-feed;Intermittent supervision/cueing for swallowing strategies Swallowing strategies  : Minimize environmental distractions;Slow rate;Small bites/sips;Follow solids with liquids Postural changes: Position pt fully upright for meals;Stay upright 30-60 min after meals Oral care recommendations: Oral care BID (2x/day) Recommended consults: Consider esophageal assessment    Darlena Koval M.A., CCC-SLP 09/22/2024,12:39 PM

## 2024-09-22 NOTE — Progress Notes (Addendum)
 "                                                        PROGRESS NOTE   Subjective/Complaints:  Obstipation overnite, pt performed manual disimpaction with partial improvement , has some anal pain  Mild abd pain , Nausea x1 when sitting up  ROS- + CP, -SOB, + contip , - N/V  Objective:   No results found. Recent Labs    09/21/24 0446  WBC 4.3  HGB 9.4*  HCT 27.0*  PLT 159   Recent Labs    09/21/24 0446  NA 132*  K 4.3  CL 99  CO2 26  GLUCOSE 87  BUN 19  CREATININE 0.75  CALCIUM  8.6*    Intake/Output Summary (Last 24 hours) at 09/22/2024 0845 Last data filed at 09/21/2024 1853 Gross per 24 hour  Intake 240 ml  Output --  Net 240 ml        Physical Exam: Vital Signs Blood pressure (!) 129/59, pulse 69, temperature 98.4 F (36.9 C), temperature source Oral, resp. rate 18, height 5' 3 (1.6 m), weight 55.9 kg, SpO2 98%.   General: No acute distress Mood and affect are appropriate Heart: Regular rate and rhythm no rubs murmurs or extra sounds Lungs: Clear to auscultation, breathing unlabored, no rales or wheezes Abdomen: Positive bowel sounds, moderately tender to palpation, nondistended Extremities: No clubbing, cyanosis, or edema Skin: No evidence of breakdown, no evidence of rash Neurologic: Cranial nerves II through XII intact, motor strength is 5/5 in right and 4/5 left deltoid, bicep, tricep, grip, hip flexor, knee extensors, ankle dorsiflexor and plantar flexor Sensory exam normal sensation to light touch and proprioception in bilateral upper and lower extremities Cerebellar exam dysmetria left finger nose finger  Musculoskeletal: Full range of motion in all 4 extremities. No joint swelling   Assessment/Plan: 1. Functional deficits which require 3+ hours per day of interdisciplinary therapy in a comprehensive inpatient rehab setting. Physiatrist is providing close team supervision and 24 hour management of active medical problems listed  below. Physiatrist and rehab team continue to assess barriers to discharge/monitor patient progress toward functional and medical goals  Care Tool:  Bathing    Body parts bathed by patient: Left arm, Chest, Abdomen, Front perineal area, Face   Body parts bathed by helper: Right arm, Buttocks, Right upper leg, Left upper leg, Left lower leg, Right lower leg     Bathing assist Assist Level: Maximal Assistance - Patient 24 - 49%     Upper Body Dressing/Undressing Upper body dressing   What is the patient wearing?: Pull over shirt, Bra    Upper body assist Assist Level: Minimal Assistance - Patient > 75%    Lower Body Dressing/Undressing Lower body dressing      What is the patient wearing?: Pants, Underwear/pull up     Lower body assist Assist for lower body dressing: Moderate Assistance - Patient 50 - 74%     Toileting Toileting    Toileting assist Assist for toileting: Maximal Assistance - Patient 25 - 49%     Transfers Chair/bed transfer  Transfers assist     Chair/bed transfer assist level: Maximal Assistance - Patient 25 - 49%     Locomotion Ambulation   Ambulation assist      Assist level: Moderate Assistance -  Patient 50 - 74% Assistive device: Other (comment) (R hand rail) Max distance: 30'   Walk 10 feet activity   Assist     Assist level: Moderate Assistance - Patient - 50 - 74% Assistive device: Other (comment) (R hand rail)   Walk 50 feet activity   Assist    Assist level: Maximal Assistance - Patient 25 - 49% Assistive device: Walker-Eva    Walk 150 feet activity   Assist Walk 150 feet activity did not occur: Safety/medical concerns         Walk 10 feet on uneven surface  activity   Assist Walk 10 feet on uneven surfaces activity did not occur: Safety/medical concerns         Wheelchair     Assist Is the patient using a wheelchair?: Yes Type of Wheelchair: Manual    Wheelchair assist level: Total  Assistance - Patient < 25% Max wheelchair distance: 26'    Wheelchair 50 feet with 2 turns activity    Assist        Assist Level: Total Assistance - Patient < 25%   Wheelchair 150 feet activity     Assist      Assist Level: Total Assistance - Patient < 25%   Blood pressure (!) 129/59, pulse 69, temperature 98.4 F (36.9 C), temperature source Oral, resp. rate 18, height 5' 3 (1.6 m), weight 55.9 kg, SpO2 98%.  Medical Problem List and Plan: 1. Functional deficits secondary to right thalamic intraparenchymal hemorrhage             -patient may shower             -ELOS/Goals: 12-16 days MinA             Admit to CIR   2.  Antithrombotics: -DVT/anticoagulation:  Pharmaceutical: Lovenox              -antiplatelet therapy: N/A due to thalamic hemorrhage 3. Pain Management: Tylenol  prn mild pain and oxycodone  prn severe pain             --will add K pad for local measures. Voltaren gel to heel   4. Mood/Behavior/Sleep:  LCSW to follow for evaluation and support.             --Melatonin prn for insomnia             -antipsychotic agents: N/A 5. Neuropsych/cognition: This patient is capable of making decisions on her own behalf. 6. Skin/Wound Care: Routine pressure relief measures.              --TLSO d/c due to excessive bruising with use.              -- 7. Fluids/Electrolytes/Nutrition: Monitor I/O. Continue nutritional supplements 8. Hypotension: decrease cozaar  to 12.5mg  after supper   9.  Chronic hyponatremia: Continue to monitor for stability             --baseline 130 going per chart review 10. L4 Compression Fx: Has spinal stenosis with impingement of L4 and L5 nerves per imaging --Chronic L1 compression Fx s/p kyphoplasty.  LSO was used at [facility] but not being used at the hospital per daughter --Will order for support.    11.  Chronic diastolic HF: Monitor for signs of overload. Check daily weighs.              --Used lasix  prn PTA   12. Hx of Sjogren  syndrome/cutaneous Lupus:On low dose prednisone .  --will resume Methotrexate  per recommendations--every  Sunday per family --Eye drops qid with lacrilube at nights.   13. Anemia of chronic disease: Baseline Hgb around 9             --Was transfused w/one unit on 12/23 and stable. Monitor for signs of bleeding             --Reactive thrombocytopenia has resolved.       Latest Ref Rng & Units 09/21/2024    4:46 AM 09/14/2024   11:38 AM 09/10/2024    4:48 AM  CBC  WBC 4.0 - 10.5 K/uL 4.3  3.5  2.5   Hemoglobin 12.0 - 15.0 g/dL 9.4  9.5  8.8   Hematocrit 36.0 - 46.0 % 27.0  29.3  25.8   Platelets 150 - 400 K/uL 159  213  187     14.H/o Depression: continue Zoloft.    15. Hx of Left foot ankle pain: Calcaneal enthesopathy with tendinosis distal Achilles tendon.     16. Hypothyroid s/p thyroidectomy: continue supplement.     17 .  Constipation - will give SMOG enema today   LOS: 2 days A FACE TO FACE EVALUATION WAS PERFORMED  Prentice FORBES Compton 09/22/2024, 8:45 AM     "

## 2024-09-22 NOTE — Progress Notes (Signed)
 Attempted to see patient for OT treatment. Patient just returning to room and reported continued nausea and fatigue. Unable to elicit participation with therapy. Patient assisted with comfort measure of cool cloth and repositioning. Patient reports she will attempt pm treatments as able. Continue with skilled POC.

## 2024-09-22 NOTE — Progress Notes (Signed)
 Physical Therapy Session Note  Patient Details  Name: Kim Santiago MRN: [id number] Date of Birth: April 28, 1935  Today's Date: 09/22/2024 PT Individual Time: 0805-0920 PT Individual Time Calculation (min): 75 min   Short Term Goals: Week 1:  PT Short Term Goal 1 (Week 1): Pt will complete bed mobility with minA PT Short Term Goal 2 (Week 1): Pt will complete bed<>chair transfers with modA and LRAD PT Short Term Goal 3 (Week 1): Pt will ambulate 46' with modA and LRAD PT Short Term Goal 4 (Week 1): Pt will improve BERG score by at least 8 points  Skilled Therapeutic Interventions/Progress Updates:  Patient supine in bed on entrance to room. Patient alert and agreeable to PT session.   Patient with 10/10 pain complaint 2/2 impactment/ constipation at start of session. Relates that she has not received her nightly Miralax  that she takes at home since arriving to [facility]. But has been given Miralax  since arriving to this unit. Also had suppository on arrival to unit. With no relief. Relates that she performed surgery on herself overnight in order to attempt to disempact. Then did not get any sleep d/t continued burning and irritation around anus.   Therapeutic Activity: Bed Mobility: Pt performed supine <> sit with ModA and cueing provided for sidelying to sit technique. Once seated and ModA to scoot forward to place feet on floor, pt is able to maintain seated balance with SBA. Assisted with dressing while seated and requiring Mod/ MaxA for LB dressing. D/t pt's upcoming MBS immediately after this session, she requests to keep current shirt on and then change with OT after swallow study. Shoes donned with MaxA. Demos good arm and leg movement in supine and seated positions.   Transfers: Pt performed sit<>stand with +2 available and ModA +2 to maintain stance d/t need for block to L knee until cued for increased knee extension. With glute squeeze, pt indicates pain in rectal area. Attempts to sit,  then increased pain and immediately stands back up with no need for cueing to LLE.   Gently brought back down to sit and pt immediately moving to supine. Requires minA to bring LLE back to bed surface. Improved pain once supine. Then pt nauseous and emetic. Time required to calm from emesis. RN notified.   With pain reducing slightly, she starts to sneeze many times. Provided with tissue paper to blow nose, then pt's nose starts to bleed from R nostril. Pt relates that she has been having at least one nose bleed per day since arriving to hospital initially. Requires time and pressure to nose and extra time to subside.   Once calmed, pt able to follow instructions to assist with boosting up toward [facility] and is able to use RUE and BLE to assist PT. Overall minA and 3 efforts.   Patient supine in bed at end of session with brakes locked, bed alarm set, and all needs within reach.   Therapy Documentation Precautions:  Precautions Precautions: Fall Recall of Precautions/Restrictions: Intact Precaution/Restrictions Comments: L hemi Required Braces or Orthoses: Spinal Brace Spinal Brace: Other (comment) (LSO) Spinal Brace Comments: for comfort for L4 fx Restrictions Weight Bearing Restrictions Per Provider Order: No  Pain:  10/10 pain related to RN and MD this session d/t imapaction and rectal pain.   Therapy/Group: Individual Therapy  Mliss DELENA Milliner PT, DPT, CSRS 09/22/2024, 8:14 AM

## 2024-09-23 DIAGNOSIS — I610 Nontraumatic intracerebral hemorrhage in hemisphere, subcortical: Secondary | ICD-10-CM | POA: Diagnosis not present

## 2024-09-23 MED ORDER — SODIUM CHLORIDE 0.9 % BOLUS PEDS
500.0000 mL | Freq: Once | INTRAVENOUS | Status: AC
  Administered 2024-09-23: 500 mL via INTRAVENOUS

## 2024-09-23 NOTE — Progress Notes (Signed)
 Occupational Therapy Note  Patient Details  Name: KYERRA VARGO MRN: [id number] Date of Birth: 19-Apr-1935  Today's Date: 09/23/2024 OT Missed Time: 45 Minutes (MD indicated patient missed session secondary to fatigue.) Missed Time Reason: Patient fatigue     [name] 09/23/2024, 1:28 PM

## 2024-09-23 NOTE — Progress Notes (Signed)
 Speech Language Pathology Daily Session Note  Patient Details  Name: ASUNA PETH MRN: [id number] Date of Birth: 1935/08/20  Today's Date: 09/23/2024 SLP Individual Time: 9165-9149    Short Term Goals: Week 1: SLP Short Term Goal 1 (Week 1): Patient will participate in MBS to assess oropharyngeal swallowing mechanism SLP Short Term Goal 2 (Week 1): Patient will recall and utilize memory compensatory strategies with min multimodal A SLP Short Term Goal 3 (Week 1): Patient will demonstrate mildly complex problem solving skills in functional tasks given min multimodal a  Skilled Therapeutic Interventions: Skilled intervention focused on cognition. RN and daughter were in room upon ST arrival. She answered orientation questions with mod A and use of monthly calendar. She was able to state current physical limitations and noticeable changes in cognition with her memory. Educated pt on MBS results. She consumed pills whole with thin liquids with no overt s/sx of aspiration or penetration. Treatment interrupted due to pt requesting to be changed before proceeding further with therapy. Session ended prior to completion due to time constraints.      Pain Pain Assessment Pain Scale: Faces Faces Pain Scale: No hurt  Therapy/Group: Individual Therapy  Recardo LABOR Bleu Minerd 09/23/2024, 12:42 PM

## 2024-09-23 NOTE — Progress Notes (Signed)
 Physical Therapy Session Note  Patient Details  Name: GWENDLYON ZUMBRO MRN: [id number] Date of Birth: Jan 08, 1935  Today's Date: 09/23/2024 PT Individual Time:  -      Short Term Goals: Week 1:  PT Short Term Goal 1 (Week 1): Pt will complete bed mobility with minA PT Short Term Goal 2 (Week 1): Pt will complete bed<>chair transfers with modA and LRAD PT Short Term Goal 3 (Week 1): Pt will ambulate 54' with modA and LRAD PT Short Term Goal 4 (Week 1): Pt will improve BERG score by at least 8 points  Pt missed 45 min of skilled therapy due to pt asleep and pt having severe constipation and having to go through enema's. Pt daughter present and stated that physician communicated to her that it would be best to hold therapy to have pt rest. PTA agreed to let pt rest due to earlier sessions not going well because of said reasoning. Will re-attempt as schedule and pt availability permits.  Therapy Documentation Precautions:  Precautions Precautions: Fall Recall of Precautions/Restrictions: Intact Precaution/Restrictions Comments: L hemi Required Braces or Orthoses: Spinal Brace Spinal Brace: Other (comment) (LSO) Spinal Brace Comments: for comfort for L4 fx Restrictions Weight Bearing Restrictions Per Provider Order: No  Therapy/Group: Individual Therapy  Idabelle Mcpeters PTA 09/23/2024, 3:01 PM

## 2024-09-23 NOTE — Progress Notes (Signed)
 Occupational Therapy Session Note  Patient Details  Name: Kim Santiago MRN: [id number] Date of Birth: Nov 10, 1934  Today's Date: 09/23/2024 OT Individual Time: 9048-8954 OT Individual Time Calculation (min): 54 min    Short Term Goals: Week 1:  OT Short Term Goal 1 (Week 1): Pt will maintain static sitting balance in preparation for transfers with SUP OT Short Term Goal 2 (Week 1): Pt will complete toilet transfers with MOD A OT Short Term Goal 3 (Week 1): Pt will recall hemi-dressing techniques with MIN questioning cues OT Short Term Goal 4 (Week 1): Pt will position L UE in bed or on tray table with SUP OT Short Term Goal 5 (Week 1): Pt complete LB dressing with MIN A  Skilled Therapeutic Interventions/Progress Updates:   Initial Encounter:   Patient resting in bed with family present at the time of arrival. Patient reported having challenges resting during the night secondary to constipation.The pt was given a laxative to address her constipation and was able to have some relief. The pt also reported having pain associated with her bottom  being sore from have loose stool and nursing was able to apply a topical to address her pain.   BADL Related Task: The pt was able to come from supine in bed to EOB with Max to ModA, The pt was able to transfer from EOB to w/c LOF using the RW with her back brace in place at [facility] to [facility]. The pt was ablle to transfer from EOB to w/c LOF using the  RW with ModA. The pt was transported to the rest room was was able to transfer from w/c LOF to the commode with ModAx2 secondary to the pt's reaching and grabbing objects secondary to a fear of falls. The pt was able to come from seated on the commode with Modx2  for toileting hygiene at Dep.  The pt was Depx2 for maintaining stand for donning her brief. The pt was transported to the sink and was able to wash her hands with s/uA. The pt began to dry heave and was able to transfer from w/c LOF to EOB using the bed  rail and the arm of the w/c with Mod to MinA.. The pt was ModA for managing BLE onto the bed for transitioning from EOB to supine. The patient was Depx2 for going up in the bed. At the end of the session, the call light and bed side table were placed within reach with all additonal needs addressed.   Therapy Documentation Precautions:  Precautions Precautions: Fall Recall of Precautions/Restrictions: Intact Precaution/Restrictions Comments: L hemi Required Braces or Orthoses: Spinal Brace Spinal Brace: Other (comment) (LSO) Spinal Brace Comments: for comfort for L4 fx Restrictions Weight Bearing Restrictions Per Provider Order: No   Therapy/Group: Individual Therapy  [name] 09/23/2024, 3:25 PM

## 2024-09-23 NOTE — Progress Notes (Signed)
 "                                                        PROGRESS NOTE   Subjective/Complaints:  Continued with multiple bowel movements after enema overnight.  Is exhausted today, family concerned about worsening cognition and dysarthria, patient oriented to self and place and partially to time on exam and able to follow commands appropriately but falls asleep frequently.  BP soft, but otherwise vitals are stable.  ROS- + CP, -SOB, + contip , - N/V Unchanged Objective:   DG Swallowing Func-Speech Pathology Result Date: 09/22/2024 Table formatting from the original result was not included. Modified Barium Swallow Study Patient Details Name: Kim Santiago MRN: [id number] Date of Birth: 1935/08/15 Today's Date: 09/22/2024 HPI/PMH: HPI: Pt is a 89 y.o. female with medical history significant of HTN, hypothyroidism, HLD, Sjgren's syndrome, SLE, GERD, anxiety/depression, sent from nursing home for evaluation of possible stroke.     Patient was recently hospitalized 2x in Dec. 2025 for influenza A pneumonia complicated with CAP and a Fall w/ compression fracture discharged to nursing home for Rehab.  This morning, patient suddenly developed left-sided weakness around 10:30 AM, and patient sent to ED for further evaluation.  Patient denied any headache vision problems no nauseous vomiting.   ED Course: Afebrile, nontachycardic blood pressure 120/40.  CT head showed hemorrhagic stroke on the right side of thalamic region with no significant mass effect or midline shift.  After discussion with intensivist and neurology, family decided to start patient on comfort measure.  Pt began improving in overall status; repeat MRI on 1/9 revealed: Right thalamic intraparenchymal hemorrhage, slightly decreased in size  compared to the prior study.  2. Similar degree of surrounding edema without significant midline shift.  3. No new or enlarging foci of intracranial hemorrhage.. Clinical Impression: Patient presents with  a functional oropharyngeal swallow. Oral phase is characterized by reduced bolus prep/mastication resulting in moderately prolonged mastication of solids and requiring multiple swallows/liquid wash for clearance of oral cavity. Pharyngeal phase is remarkable for occasional mistiming of epiglottic inversion/laryngeal vestibular closure resulting in transient penetration of thin liquids. Of note, this is considered WFL. No penetration/aspiration present with remaining consistencies of NTL, puree, solids and pill. Patient with complete clearance of pharyngeal space. During esophageal sweep, observed suspected slowed esophageal motility requiring additional bites of puree to clear pill into stomach. Recommend continuation of current diet (regular/thin) with medications whole in puree. Patient should utilize liquid wash and sit upright during and after meals. DIGEST Swallow Severity Rating*  Safety: 0  Efficiency:0  Overall Pharyngeal Swallow Severity: functional 1: mild; 2: moderate; 3: severe; 4: profound *The Dynamic Imaging Grade of Swallowing Toxicity is standardized for the head and neck cancer population, however, demonstrates promising clinical applications across populations to standardize the clinical rating of pharyngeal swallow safety and severity. Recommendations/Plan: Swallowing Evaluation Recommendations Swallowing Evaluation Recommendations Recommendations: PO diet PO Diet Recommendation: Regular; Thin liquids (Level 0) Liquid Administration via: Cup; Straw Medication Administration: Whole meds with puree Supervision: Patient able to self-feed; Intermittent supervision/cueing for swallowing strategies Swallowing strategies  : Minimize environmental distractions; Slow rate; Small bites/sips; Follow solids with liquids Postural changes: Position pt fully upright for meals; Stay upright 30-60 min after meals Oral care recommendations: Oral care BID (2x/day) Recommended consults: Consider esophageal  assessment  Treatment Plan Treatment Plan Treatment recommendations: Therapy as outlined in treatment plan below Follow-up recommendations: Outpatient SLP Recommendations Comment: n/a Functional status assessment: Patient has had a recent decline in their functional status and demonstrates the ability to make significant improvements in function in a reasonable and predictable amount of time. Treatment frequency: Min 2x/week Treatment duration: 2 weeks Interventions: Patient/family education; Compensatory techniques; Diet toleration management by SLP Recommendations Recommendations for follow up therapy are one component of a multi-disciplinary discharge planning process, led by the attending physician.  Recommendations may be updated based on patient status, additional functional criteria and insurance authorization. Assessment: Orofacial Exam: Orofacial Exam Oral Cavity: Oral Hygiene: WFL Oral Cavity - Dentition: Adequate natural dentition Orofacial Anatomy: WFL Oral Motor/Sensory Function: WFL Anatomy: Anatomy: Other (Comment); Suspected cervical osteophytes (suspected presence of diffuse calcifications) Boluses Administered: Boluses Administered Boluses Administered: Thin liquids (Level 0); Mildly thick liquids (Level 2, nectar thick); Puree; Solid  Oral Impairment Domain: Oral Impairment Domain Lip Closure: No labial escape Tongue control during bolus hold: Cohesive bolus between tongue to palatal seal Bolus preparation/mastication: Slow prolonged chewing/mashing with complete recollection Bolus transport/lingual motion: Repetitive/disorganized tongue motion Oral residue: Complete oral clearance (complete after multiple sponatenous swallows to clear) Initiation of pharyngeal swallow : Pyriform sinuses  Pharyngeal Impairment Domain: Pharyngeal Impairment Domain Soft palate elevation: No bolus between soft palate (SP)/pharyngeal wall (PW) Laryngeal elevation: Complete superior movement of thyroid  cartilage with  complete approximation of arytenoids to epiglottic petiole Anterior hyoid excursion: Complete anterior movement Epiglottic movement: Complete inversion (occasional mistiming, though functional) Laryngeal vestibule closure: Complete, no air/contrast in laryngeal vestibule (occasional mistiming, though functional) Pharyngeal stripping wave : Present - complete Pharyngeal contraction (A/P view only): N/A Pharyngoesophageal segment opening: Complete distension and complete duration, no obstruction of flow Tongue base retraction: No contrast between tongue base and posterior pharyngeal wall (PPW) Pharyngeal residue: Complete pharyngeal clearance  Esophageal Impairment Domain: Esophageal Impairment Domain Esophageal clearance upright position: Esophageal retention Pill: Pill Consistency administered: Puree Puree: Impaired (see clinical impressions) Penetration/Aspiration Scale Score: Penetration/Aspiration Scale Score 1.  Material does not enter airway: Mildly thick liquids (Level 2, nectar thick); Puree; Solid; Pill 2.  Material enters airway, remains ABOVE vocal cords then ejected out: Thin liquids (Level 0) Compensatory Strategies: Compensatory Strategies Compensatory strategies: No   General Information: No data recorded Diet Prior to this Study: Regular; Thin liquids (Level 0)   No data recorded  Respiratory Status: WFL   Supplemental O2: None (Room air)   No data recorded Behavior/Cognition: Alert; Pleasant mood; Cooperative No data recorded Baseline vocal quality/speech: Normal Volitional Cough: Able to elicit Volitional Swallow: Able to elicit Exam Limitations: No limitations Goal Planning: No data recorded No data recorded Barriers/Prognosis Comment: n/a Patient/Family Stated Goal: n/a Consulted and agree with results and recommendations: Patient Pain: No data recorded End of Session: Start Time:No data recorded Stop Time: No data recorded Time Calculation:No data recorded Charges: No data recorded SLP visit  diagnosis: SLP Visit Diagnosis: Cognitive communication deficit (R41.841); Dysphagia, unspecified (R13.10) Past Medical History: Past Medical History: Diagnosis Date  Arthritis   Breast cancer (HCC) 2017  left mastectomy done 11/2015  Breast cancer in female [facility]) 11/18/2015  Left: 3.9 cm tumor, T2, 1/2 sentinel nodes positive for macro metastatic disease, N1, 3 negative nodes in the axillary tail, ER+,PR+, Her 2 neu, low Mammoprint score  Cancer (HCC)   thyroid  takes levothyroxine   CAP (community acquired pneumonia) 05/05/2021  Chronic kidney disease   UTI  Genetic screening 11/2015  Mammoprint of  left breast cancer: Low risk for recurrence.   History of heart attack 06/12/2014  Hypertension   hypothyroidism   secondary to thyroidectomy for thyroid  ca  Hypothyroidism   Lupus   subcutaneous  Menopause 40s  natural, hot flashes and mood lability now gone, off prempro 7 months  Myocardial infarction [facility]) 2013  Osteoporosis   Osteopenia  Rosacea   Sjoegren syndrome   Stress-induced cardiomyopathy September of 2013  EF 35%. Peak troponin was 1.8.  Stroke [facility]) 10/2018 Past Surgical History: Past Surgical History: Procedure Laterality Date  BACK SURGERY    BREAST BIOPSY Left 10/30/15  positive, done in Dr. Fredirick office  CARDIAC CATHETERIZATION  05/2012  [facility]. No significant CAD. Ejection fraction of 35% due to stress-induced cardiomyopathy.  CHOLECYSTECTOMY    COLONOSCOPY    DILATION AND CURETTAGE OF UTERUS    KYPHOSIS SURGERY  Feb 2008  L1, Dr. Kathi  LUMBAR DISC SURGERY    L4-L5  MASTECTOMY Left 11/18/2015  positive  SENTINEL NODE BIOPSY Left 11/18/2015  Procedure: SENTINEL NODE BIOPSY;  Surgeon: Reyes LELON Cota, MD;  Location: [facility] ORS;  Service: General;  Laterality: Left;  SHOULDER ARTHROSCOPY  2004  Left, Dr. Maryl  SIMPLE MASTECTOMY WITH AXILLARY SENTINEL NODE BIOPSY Left 11/18/2015  Procedure: SIMPLE MASTECTOMY;  Surgeon: Reyes LELON Cota, MD;  Location: [facility] ORS;  Service: General;  Laterality: Left;   SPINE SURGERY    L4-5 diskectomy  THYROIDECTOMY    Thyroid  Cancer  TONSILLECTOMY    TUBAL LIGATION   Cassidi Sockwell M.A., CCC-SLP 09/22/2024, 12:40 PM  Recent Labs    09/21/24 0446  WBC 4.3  HGB 9.4*  HCT 27.0*  PLT 159   Recent Labs    09/21/24 0446  NA 132*  K 4.3  CL 99  CO2 26  GLUCOSE 87  BUN 19  CREATININE 0.75  CALCIUM  8.6*    Intake/Output Summary (Last 24 hours) at 09/23/2024 1646 Last data filed at 09/23/2024 1322 Gross per 24 hour  Intake 120 ml  Output --  Net 120 ml        Physical Exam: Vital Signs Blood pressure (!) 90/42, pulse 87, temperature 98.1 F (36.7 C), temperature source Oral, resp. rate 16, height 5' 3 (1.6 m), weight 56.7 kg, SpO2 98%.   General: No acute distress.  Laying in bed. Lethargic; mood and affect are appropriate Heart: Regular rate and rhythm no rubs murmurs or extra sounds Lungs: Clear to auscultation, breathing unlabored, no rales or wheezes Abdomen: Positive bowel sounds, non-tender to palpation, nondistended Extremities: No clubbing, cyanosis, or edema Skin: No evidence of breakdown, no evidence of rash Neurologic:  Lethargic, but arousable to repeated stimuli.  Oriented to self, place, and year. Moderately dysarthric Cranial nerves II through XII intact, moving all 4 extremities in bed antigravity against resistance.   Sensory exam normal sensation to light touch and proprioception in bilateral upper and lower extremities Cerebellar exam dysmetria left finger nose finger  Musculoskeletal: Full range of motion in all 4 extremities. No joint swelling   Assessment/Plan: 1. Functional deficits which require 3+ hours per day of interdisciplinary therapy in a comprehensive inpatient rehab setting. Physiatrist is providing close team supervision and 24 hour management of active medical problems listed below. Physiatrist and rehab team continue to assess barriers to discharge/monitor patient progress toward functional and  medical goals  Care Tool:  Bathing    Body parts bathed by patient: Left arm, Chest, Abdomen, Front perineal area, Face   Body parts bathed  by helper: Right arm, Buttocks, Right upper leg, Left upper leg, Left lower leg, Right lower leg     Bathing assist Assist Level: Maximal Assistance - Patient 24 - 49%     Upper Body Dressing/Undressing Upper body dressing   What is the patient wearing?: Pull over shirt, Bra    Upper body assist Assist Level: Minimal Assistance - Patient > 75%    Lower Body Dressing/Undressing Lower body dressing      What is the patient wearing?: Pants, Underwear/pull up     Lower body assist Assist for lower body dressing: Moderate Assistance - Patient 50 - 74%     Toileting Toileting    Toileting assist Assist for toileting: Maximal Assistance - Patient 25 - 49%     Transfers Chair/bed transfer  Transfers assist     Chair/bed transfer assist level: Maximal Assistance - Patient 25 - 49%     Locomotion Ambulation   Ambulation assist      Assist level: Moderate Assistance - Patient 50 - 74% Assistive device: Other (comment) (R hand rail) Max distance: 30'   Walk 10 feet activity   Assist     Assist level: Moderate Assistance - Patient - 50 - 74% Assistive device: Other (comment) (R hand rail)   Walk 50 feet activity   Assist    Assist level: Maximal Assistance - Patient 25 - 49% Assistive device: Walker-Eva    Walk 150 feet activity   Assist Walk 150 feet activity did not occur: Safety/medical concerns         Walk 10 feet on uneven surface  activity   Assist Walk 10 feet on uneven surfaces activity did not occur: Safety/medical concerns         Wheelchair     Assist Is the patient using a wheelchair?: Yes Type of Wheelchair: Manual    Wheelchair assist level: Total Assistance - Patient < 25% Max wheelchair distance: 61'    Wheelchair 50 feet with 2 turns activity    Assist         Assist Level: Total Assistance - Patient < 25%   Wheelchair 150 feet activity     Assist      Assist Level: Total Assistance - Patient < 25%   Blood pressure (!) 90/42, pulse 87, temperature 98.1 F (36.7 C), temperature source Oral, resp. rate 16, height 5' 3 (1.6 m), weight 56.7 kg, SpO2 98%.  Medical Problem List and Plan: 1. Functional deficits secondary to right thalamic intraparenchymal hemorrhage             -patient may shower             -ELOS/Goals: 12-16 days MinA             -Stable to continue CIR   2.  Antithrombotics: -DVT/anticoagulation:  Pharmaceutical: Lovenox              -antiplatelet therapy: N/A due to thalamic hemorrhage 3. Pain Management: Tylenol  prn mild pain and oxycodone  prn severe pain             --will add K pad for local measures. Voltaren gel to heel   4. Mood/Behavior/Sleep:  LCSW to follow for evaluation and support.             --Melatonin prn for insomnia             -antipsychotic agents: N/A  - 1-17: Very disrupted sleep due to frequent bowel movements overnight.  Likely cause of  lethargy today, will monitor for now  5. Neuropsych/cognition: This patient is capable of making decisions on her own behalf. 6. Skin/Wound Care: Routine pressure relief measures.              --TLSO d/c due to excessive bruising with use.              --  - Bruising stable  1-17: Add Prevalon boots to bilateral feet to protect heels  7. Fluids/Electrolytes/Nutrition: Monitor I/O. Continue nutritional supplements 8. Hypotension: decrease cozaar  to 12.5mg  after supper   - 1-17: Poor p.o. intakes due to abdominal pain/excessive bowel movements today.  BP 90s over 40s this evening.  Will give some IV fluids 500 cc at 125/h and hold Cozaar .  9.  Chronic hyponatremia: Continue to monitor for stability             --baseline 130 going per chart review  - 132 1-15--recheck 1-18  10. L4 Compression Fx: Has spinal stenosis with impingement of L4 and L5  nerves per imaging --Chronic L1 compression Fx s/p kyphoplasty.  LSO was used at [facility] but not being used at the hospital per daughter --Will order for support.    11.  Chronic diastolic HF: Monitor for signs of overload. Check daily weighs.              --Used lasix  prn PTA   12. Hx of Sjogren syndrome/cutaneous Lupus:On low dose prednisone .  --will resume Methotrexate  per recommendations--every Sunday per family --Eye drops qid with lacrilube at nights.   13. Anemia of chronic disease: Baseline Hgb around 9             --Was transfused w/one unit on 12/23 and stable. Monitor for signs of bleeding             --Reactive thrombocytopenia has resolved.       Latest Ref Rng & Units 09/21/2024    4:46 AM 09/14/2024   11:38 AM 09/10/2024    4:48 AM  CBC  WBC 4.0 - 10.5 K/uL 4.3  3.5  2.5   Hemoglobin 12.0 - 15.0 g/dL 9.4  9.5  8.8   Hematocrit 36.0 - 46.0 % 27.0  29.3  25.8   Platelets 150 - 400 K/uL 159  213  187     14.H/o Depression: continue Zoloft.    15. Hx of Left foot ankle pain: Calcaneal enthesopathy with tendinosis distal Achilles tendon.     16. Hypothyroid s/p thyroidectomy: continue supplement.     17 .  Constipation - will give SMOG enema today   - 1-17: Excessive bowel movements overnight with enema, suppository.  Abdominal exam benign.  Monitor  LOS: 3 days A FACE TO FACE EVALUATION WAS PERFORMED  Joesph JAYSON Likes 09/23/2024, 4:46 PM     "

## 2024-09-24 DIAGNOSIS — I610 Nontraumatic intracerebral hemorrhage in hemisphere, subcortical: Secondary | ICD-10-CM | POA: Diagnosis not present

## 2024-09-24 LAB — BASIC METABOLIC PANEL WITH GFR
Anion gap: 9 (ref 5–15)
BUN: 30 mg/dL — ABNORMAL HIGH (ref 8–23)
CO2: 24 mmol/L (ref 22–32)
Calcium: 7.8 mg/dL — ABNORMAL LOW (ref 8.9–10.3)
Chloride: 94 mmol/L — ABNORMAL LOW (ref 98–111)
Creatinine, Ser: 1.03 mg/dL — ABNORMAL HIGH (ref 0.44–1.00)
GFR, Estimated: 52 mL/min — ABNORMAL LOW
Glucose, Bld: 90 mg/dL (ref 70–99)
Potassium: 4.3 mmol/L (ref 3.5–5.1)
Sodium: 126 mmol/L — ABNORMAL LOW (ref 135–145)

## 2024-09-24 MED ORDER — SODIUM CHLORIDE 0.9 % IV SOLN: INTRAVENOUS | Status: AC

## 2024-09-24 MED ORDER — LOPERAMIDE HCL 2 MG PO CAPS: 2.0000 mg | ORAL_CAPSULE | ORAL | Status: DC | PRN

## 2024-09-24 NOTE — Progress Notes (Signed)
 "                                                        PROGRESS NOTE   Subjective/Complaints:  No events overnight.  BP soft but improved after fluid bolus yesterday.  Other vitals are stable. Patient significantly more awake, alert today.  Working on eating some lunch.  Daughter at bedside, discussed medical care, answered all questions.  Still had multiple bowel movements overnight, but patient feels this is improving.  Labs significant for sodium 126, creatinine 1.03, BUN of 30.   ROS: Positives per HPI above. Denies fevers, chills, N/V, abdominal pain, SOB, chest pain, new weakness or paraesthesias.   + Diarrhea  Objective:   No results found.  No results for input(s): WBC, HGB, HCT, PLT in the last 72 hours.  Recent Labs    09/24/24 0512  NA 126*  K 4.3  CL 94*  CO2 24  GLUCOSE 90  BUN 30*  CREATININE 1.03*  CALCIUM  7.8*    Intake/Output Summary (Last 24 hours) at 09/24/2024 2208 Last data filed at 09/24/2024 1800 Gross per 24 hour  Intake 1307.38 ml  Output --  Net 1307.38 ml        Physical Exam: Vital Signs Blood pressure (!) 110/54, pulse 73, temperature 98.8 F (37.1 C), temperature source Oral, resp. rate 16, height 5' 3 (1.6 m), weight 56.6 kg, SpO2 100%.   General: No acute distress.  Laying in bed. Awake, alert, mood and affect are appropriate. Heart: Regular rate and rhythm no rubs murmurs or extra sounds Lungs: Clear to auscultation, breathing unlabored, no rales or wheezes Abdomen: Positive bowel sounds, non-tender to palpation, nondistended Extremities: No clubbing, cyanosis, or edema Skin: No evidence of breakdown, no evidence of rash  Neurologic:  Awake, alert.  Oriented to self, place, and year. Moderately dysarthric--much improved 1-18 Cranial nerves II through XII intact, moving all 4 extremities in bed antigravity against resistance.   Sensory exam normal sensation to light touch and proprioception in bilateral upper and  lower extremities Cerebellar exam dysmetria left finger nose finger  Musculoskeletal: Full range of motion in all 4 extremities. No joint swelling   Assessment/Plan: 1. Functional deficits which require 3+ hours per day of interdisciplinary therapy in a comprehensive inpatient rehab setting. Physiatrist is providing close team supervision and 24 hour management of active medical problems listed below. Physiatrist and rehab team continue to assess barriers to discharge/monitor patient progress toward functional and medical goals  Care Tool:  Bathing    Body parts bathed by patient: Left arm, Chest, Abdomen, Front perineal area, Face   Body parts bathed by helper: Right arm, Buttocks, Right upper leg, Left upper leg, Left lower leg, Right lower leg     Bathing assist Assist Level: Maximal Assistance - Patient 24 - 49%     Upper Body Dressing/Undressing Upper body dressing   What is the patient wearing?: Pull over shirt, Bra    Upper body assist Assist Level: Minimal Assistance - Patient > 75%    Lower Body Dressing/Undressing Lower body dressing      What is the patient wearing?: Pants, Underwear/pull up     Lower body assist Assist for lower body dressing: Moderate Assistance - Patient 50 - 74%     Editor, Commissioning  assist Assist for toileting: Maximal Assistance - Patient 25 - 49%     Transfers Chair/bed transfer  Transfers assist     Chair/bed transfer assist level: Maximal Assistance - Patient 25 - 49%     Locomotion Ambulation   Ambulation assist      Assist level: Moderate Assistance - Patient 50 - 74% Assistive device: Other (comment) (R hand rail) Max distance: 30'   Walk 10 feet activity   Assist     Assist level: Moderate Assistance - Patient - 50 - 74% Assistive device: Other (comment) (R hand rail)   Walk 50 feet activity   Assist    Assist level: Maximal Assistance - Patient 25 - 49% Assistive device: Walker-Eva     Walk 150 feet activity   Assist Walk 150 feet activity did not occur: Safety/medical concerns         Walk 10 feet on uneven surface  activity   Assist Walk 10 feet on uneven surfaces activity did not occur: Safety/medical concerns         Wheelchair     Assist Is the patient using a wheelchair?: Yes Type of Wheelchair: Manual    Wheelchair assist level: Total Assistance - Patient < 25% Max wheelchair distance: 60'    Wheelchair 50 feet with 2 turns activity    Assist        Assist Level: Total Assistance - Patient < 25%   Wheelchair 150 feet activity     Assist      Assist Level: Total Assistance - Patient < 25%   Blood pressure (!) 110/54, pulse 73, temperature 98.8 F (37.1 C), temperature source Oral, resp. rate 16, height 5' 3 (1.6 m), weight 56.6 kg, SpO2 100%.  Medical Problem List and Plan: 1. Functional deficits secondary to right thalamic intraparenchymal hemorrhage             -patient may shower             -ELOS/Goals: 12-16 days MinA             -Stable to continue CIR   2.  Antithrombotics: -DVT/anticoagulation:  Pharmaceutical: Lovenox              -antiplatelet therapy: N/A due to thalamic hemorrhage 3. Pain Management: Tylenol  prn mild pain and oxycodone  prn severe pain             --will add K pad for local measures. Voltaren gel to heel   4. Mood/Behavior/Sleep:  LCSW to follow for evaluation and support.             --Melatonin prn for insomnia             -antipsychotic agents: N/A  - 1-17: Very disrupted sleep due to frequent bowel movements overnight.  Likely cause of lethargy today, will monitor for now--much improved 1-18  5. Neuropsych/cognition: This patient is capable of making decisions on her own behalf. 6. Skin/Wound Care: Routine pressure relief measures.              --TLSO d/c due to excessive bruising with use.              --  - Bruising stable  1-17: Add Prevalon boots to bilateral feet to  protect heels  7. Fluids/Electrolytes/Nutrition: Monitor I/O. Continue nutritional supplements 8. Hypotension: decrease cozaar  to 12.5mg  after supper   - 1-17: Poor p.o. intakes due to abdominal pain/excessive bowel movements today.  BP 90s over 40s this evening.  Will give some IV fluids 500 cc at 125/h and hold Cozaar  1-18: BP remains soft, responded to fluid bolus, likely losses from excessive bowel movements and poor p.o. intakes.  Start gentle IV fluids 50 cc/h today.   9.  Chronic hyponatremia: Continue to monitor for stability             --baseline 130 going per chart review  - 132 1-15  1-18: Sodium 126, likely hypovolemic, start gentle IV fluid normal saline 50 cc/h as above.  Repeat in AM.  10. L4 Compression Fx: Has spinal stenosis with impingement of L4 and L5 nerves per imaging --Chronic L1 compression Fx s/p kyphoplasty.  LSO was used at [facility] but not being used at the hospital per daughter --Will order for support.    11.  Chronic diastolic HF: Monitor for signs of overload. Check daily weighs.              --Used lasix  prn PTA   - Weight stable, no signs of volume overload 1-18 Filed Weights   09/22/24 0531 09/23/24 0413 09/24/24 0550  Weight: 55.9 kg 56.7 kg 56.6 kg      12. Hx of Sjogren syndrome/cutaneous Lupus:On low dose prednisone .  --will resume Methotrexate  per recommendations--every Sunday per family --Eye drops qid with lacrilube at nights.    13. Anemia of chronic disease: Baseline Hgb around 9             --Was transfused w/one unit on 12/23 and stable. Monitor for signs of bleeding             --Reactive thrombocytopenia has resolved.       Latest Ref Rng & Units 09/21/2024    4:46 AM 09/14/2024   11:38 AM 09/10/2024    4:48 AM  CBC  WBC 4.0 - 10.5 K/uL 4.3  3.5  2.5   Hemoglobin 12.0 - 15.0 g/dL 9.4  9.5  8.8   Hematocrit 36.0 - 46.0 % 27.0  29.3  25.8   Platelets 150 - 400 K/uL 159  213  187     14.H/o Depression: continue Zoloft.    15. Hx of  Left foot ankle pain: Calcaneal enthesopathy with tendinosis distal Achilles tendon.     16. Hypothyroid s/p thyroidectomy: continue supplement.     17 .  Constipation - will give SMOG enema today   - 1-17: Excessive bowel movements overnight.  Abdominal exam benign.  Monitor  - 1-18: Continues with frequent bowel movements, although is improving.  DC bowel regimen with MiraLAX , Senokot, added as needed Imodium  2 mg.   LOS: 4 days A FACE TO FACE EVALUATION WAS PERFORMED  Kim Santiago Likes 09/24/2024, 10:08 PM     "

## 2024-09-24 NOTE — Plan of Care (Signed)
" °  Problem: Consults Goal: RH STROKE PATIENT EDUCATION Description: See Patient Education module for education specifics  Outcome: Progressing   Problem: RH BOWEL ELIMINATION Goal: RH STG MANAGE BOWEL WITH ASSISTANCE Description: STG Manage Bowel with supervision Assistance. Outcome: Progressing   Problem: RH BLADDER ELIMINATION Goal: RH STG MANAGE BLADDER WITH ASSISTANCE Description: STG Manage Bladder With supervision Assistance Outcome: Progressing   Problem: RH SKIN INTEGRITY Goal: RH STG SKIN FREE OF INFECTION/BREAKDOWN Description: Manage skin free of infection with supervision assistance Outcome: Progressing   Problem: RH SAFETY Goal: RH STG ADHERE TO SAFETY PRECAUTIONS W/ASSISTANCE/DEVICE Description: STG Adhere to Safety Precautions With Assistance/Device. Outcome: Progressing   Problem: RH PAIN MANAGEMENT Goal: RH STG PAIN MANAGED AT OR BELOW PT'S PAIN GOAL Description: <4 w/ prns Outcome: Progressing   Problem: RH KNOWLEDGE DEFICIT Goal: RH STG INCREASE KNOWLEDGE OF HYPERTENSION Description: Manage increase knowledge of hypertension with supervision assistance from daughter using educational materials provided Outcome: Progressing Goal: RH STG INCREASE KNOWLEGDE OF HYPERLIPIDEMIA Description: Manage increase knowledge of hyperlipidemia  with supervision assistance from daughter using educational materials provided Outcome: Progressing   "

## 2024-09-25 DIAGNOSIS — G8194 Hemiplegia, unspecified affecting left nondominant side: Secondary | ICD-10-CM | POA: Diagnosis not present

## 2024-09-25 DIAGNOSIS — I610 Nontraumatic intracerebral hemorrhage in hemisphere, subcortical: Secondary | ICD-10-CM | POA: Diagnosis not present

## 2024-09-25 LAB — CBC
HCT: 24.4 % — ABNORMAL LOW (ref 36.0–46.0)
Hemoglobin: 8.5 g/dL — ABNORMAL LOW (ref 12.0–15.0)
MCH: 35.3 pg — ABNORMAL HIGH (ref 26.0–34.0)
MCHC: 34.8 g/dL (ref 30.0–36.0)
MCV: 101.2 fL — ABNORMAL HIGH (ref 80.0–100.0)
Platelets: 113 K/uL — ABNORMAL LOW (ref 150–400)
RBC: 2.41 MIL/uL — ABNORMAL LOW (ref 3.87–5.11)
RDW: 18.6 % — ABNORMAL HIGH (ref 11.5–15.5)
WBC: 8 K/uL (ref 4.0–10.5)
nRBC: 0 % (ref 0.0–0.2)

## 2024-09-25 LAB — BASIC METABOLIC PANEL WITH GFR
Anion gap: 6 (ref 5–15)
BUN: 16 mg/dL (ref 8–23)
CO2: 25 mmol/L (ref 22–32)
Calcium: 8 mg/dL — ABNORMAL LOW (ref 8.9–10.3)
Chloride: 99 mmol/L (ref 98–111)
Creatinine, Ser: 0.63 mg/dL (ref 0.44–1.00)
GFR, Estimated: >60 mL/min
Glucose, Bld: 90 mg/dL (ref 70–99)
Potassium: 4 mmol/L (ref 3.5–5.1)
Sodium: 130 mmol/L — ABNORMAL LOW (ref 135–145)

## 2024-09-25 MED ORDER — ARTIFICIAL TEARS OPHTHALMIC OINT: TOPICAL_OINTMENT | Freq: Every day | OPHTHALMIC | Status: AC

## 2024-09-25 NOTE — Progress Notes (Signed)
 Occupational Therapy Session Note  Patient Details  Name: Kim Santiago MRN: [id number] Date of Birth: [birth year]  Today's Date: 09/25/2024 OT Individual Time: 9264-9170 OT Individual Time Calculation (min): 54 min    Short Term Goals: Week 1:  OT Short Term Goal 1 (Week 1): Pt will maintain static sitting balance in preparation for transfers with SUP OT Short Term Goal 2 (Week 1): Pt will complete toilet transfers with MOD A OT Short Term Goal 3 (Week 1): Pt will recall hemi-dressing techniques with MIN questioning cues OT Short Term Goal 4 (Week 1): Pt will position L UE in bed or on tray table with SUP OT Short Term Goal 5 (Week 1): Pt complete LB dressing with MIN A  Skilled Therapeutic Interventions/Progress Updates:     Pt received resting in bed with NT present in room finishing up providing personal care. Pt presenting to be in good spirits receptive to skilled OT session reporting 5/10 pain in buttocks 2/2 frequent BMs recently- OT offering intermittent rest breaks, repositioning, and therapeutic support to optimize participation in therapy session. Rn in/out to provide morning medications. Pt's breakfast tray arriving with Pt requesting to eat during session- worked on L side attention, self feeding skills, and focused attention. Positioned tray table in midline and slightly to L to facilitate visual scanning to L and increase L side attention. Pt able to locate all items on tray and utilize L UE as a gross assist during self feeding. Min-mod verbal cues required to swallow prior to attempting to engage in conversation. 3 instances of coughing noted, Pt clearing throat well. Able to self-feed at overall SUP level. Pt receptive to getting dressed this AM, however requesting to complete bed level to prevent onset of nausea. Education provided on hemi-dressing techniques. Donned OH shirt and pants in long sitting position with OT providing MIN-MOD A for trunk support. MIN A for weaving L  U/LEs and Pt able to bring pants to waist in supine bridging hips x2 trials with OT stabilizing L LE. Difficulty controlling L UE movements during ADLs this AM d/t decreased proprioception, however Pt motivated to utilize L UE and attending well to L hemi-body. Pt able to utilize bed features and assist with pulling self to [facility] for improved positioning in bed with MOD A. Pt was left resting in bed with call bell in reach, bed alarm on, and all needs met.    Therapy Documentation Precautions:  Precautions Precautions: Fall Recall of Precautions/Restrictions: Intact Precaution/Restrictions Comments: L hemi Required Braces or Orthoses: Spinal Brace Spinal Brace: Other (comment) (LSO) Spinal Brace Comments: for comfort for L4 fx Restrictions Weight Bearing Restrictions Per Provider Order: No   Therapy/Group: Individual Therapy  [name] 09/25/2024, 7:28 AM

## 2024-09-25 NOTE — Progress Notes (Signed)
 Speech Language Pathology Daily Session Note  Patient Details  Name: Kim Santiago MRN: [id number] Date of Birth: Apr 27, 1935  Today's Date: 09/25/2024 SLP Individual Time: 0930-1029 SLP Individual Time Calculation (min): 59 min  Short Term Goals: Week 1: SLP Short Term Goal 1 (Week 1): Patient will participate in MBS to assess oropharyngeal swallowing mechanism SLP Short Term Goal 2 (Week 1): Patient will recall and utilize memory compensatory strategies with min multimodal A SLP Short Term Goal 3 (Week 1): Patient will demonstrate mildly complex problem solving skills in functional tasks given min multimodal a  Skilled Therapeutic Interventions: Skilled therapy session focused on cognitive goals. Patient extremely lethargic upon SLP entrance, requiring verbal and tactile cues to awaken. Once alert, SLP targeted problem solving goals through medication management task. Given written and verbalized instructions, patient completed BID pillbox with minA. Patient required minA correct x1 mistake. To continue to challenge patient, SLP prompted her to ID and correct mistakes made by SLP. Patient required minA and extended time. Patient left in bed with alarm set and call bell in reach. Continue POC.   Pain None reported   Therapy/Group: Individual Therapy  [name] M.A., CCC-SLP 09/25/2024, 7:39 AM

## 2024-09-25 NOTE — Progress Notes (Signed)
 Patient ID: Kim Santiago, female   DOB: [birth year], 89 y.o.   MRN: [id number] Met with the patient and dtr to review current medical condition hemorrhagic thalamic CVA, rehab process, team conference and plan of care. Discussed secondary risk management including HLD, HTN.  Reviewed lumbar compression fracture with TLSO; bruising to the chest area. Noted urinary incontinence and bowel issues are a concern; required enema for constipation and now on miralax  and senokot with acidophilus daily. Using a bedpan for bladder. Noted nausea with activity ; taking Zofran  in the morning PTA. Also noted insomnia; taking Relaxium Sleep PTA; PAC Love made aware of requests/concerns. Checking orthostatic pressures as the patient has poor endurance; down since Christmas. Continue to follow along to address educational needs to facilitate preparation for discharge. Fredericka Barnie NOVAK

## 2024-09-25 NOTE — Progress Notes (Signed)
 Physical Therapy Session Note  Patient Details  Name: Kim Santiago MRN: [id number] Date of Birth: 04-27-1935  Today's Date: 09/25/2024 PT Individual Time: 1402-1500 PT Individual Time Calculation (min): 58 min   Short Term Goals: Week 1:  PT Short Term Goal 1 (Week 1): Pt will complete bed mobility with minA PT Short Term Goal 2 (Week 1): Pt will complete bed<>chair transfers with modA and LRAD PT Short Term Goal 3 (Week 1): Pt will ambulate 32' with modA and LRAD PT Short Term Goal 4 (Week 1): Pt will improve BERG score by at least 8 points  Skilled Therapeutic Interventions/Progress Updates:  Patient seated upright in w/c on entrance to room. Patient alert and agreeable to PT session. Is sitting in flexed posture. VC/ tc provided to sit upright. Pt then c/o dizziness/ nausea.   Patient with no pain complaint at start of session. Does relate that most pain is d/t pain at anus from irritation.   Therapeutic Activity/ NMR: Transfers: Pt performed squat pivot w/c to mat table to R side with MinA overall following setup and cues for LE activation. Pt is able to sit on EOM throughout with no outside support and ability to reach for several items placed outside of BOS. Pt is very nauseated with all movements as well as with postural corrections throughout session, but with distraction of conversation, does not have emetic episode.   Sit<>stand at [facility] performed with overall Mod/ MaxA d/t dizziness and nausea with no vomiting.   Pt guided in rise to stance with bias to midline. Requires Min/ ModA to perform rise to stand and then improves to MinA d/t practice.   NMR performed for improvements in motor control and coordination, balance, sequencing, judgement, and self confidence/ efficacy in performing all aspects of mobility at highest level of independence.   Pt relates need to have bowel movement. Requested use of bedpan if available with assistance to be provided for cleanup while  in bed - not in bathroom.   Is able to follow instructions to perform stand pivot w/c to bed toward L side. Requires ModA to complete safely. Then provided with vc/ tc for transitioning into supine. Led first into sidelying but unable to bring L elbow in toward side of body, so repositioned and brings RLE to top of bed with supervision and requires CGA to bring LLE to bed.   Patient supine in bed at end of session with brakes locked, bed alarm set, and all needs within reach. NT notified as to pt's disposition.    Therapy Documentation Precautions:  Precautions Precautions: Fall Recall of Precautions/Restrictions: Intact Precaution/Restrictions Comments: L hemi Required Braces or Orthoses: Spinal Brace Spinal Brace: Other (comment) (LSO) Spinal Brace Comments: for comfort for L4 fx Restrictions Weight Bearing Restrictions Per Provider Order: No  Pain: Pain Assessment Pain Scale: 0-10 Pain Score: 3  Pain Location: Buttocks Pain Intervention(s): Medication (See eMAR) - premedicated/ treatment provided.    Therapy/Group: Individual Therapy  Mliss DELENA Milliner PT, DPT, CSRS 09/25/2024, 6:44 PM

## 2024-09-25 NOTE — Progress Notes (Signed)
 Occupational Therapy Session Note  Patient Details  Name: Kim Santiago MRN: [id number] Date of Birth: [birth year]  Today's Date: 09/25/2024 OT Individual Time: 8864-8792 OT Individual Time Calculation (min): 32 min    Short Term Goals: Week 1:  OT Short Term Goal 1 (Week 1): Pt will maintain static sitting balance in preparation for transfers with SUP OT Short Term Goal 2 (Week 1): Pt will complete toilet transfers with MOD A OT Short Term Goal 3 (Week 1): Pt will recall hemi-dressing techniques with MIN questioning cues OT Short Term Goal 4 (Week 1): Pt will position L UE in bed or on tray table with SUP OT Short Term Goal 5 (Week 1): Pt complete LB dressing with MIN A  Skilled Therapeutic Interventions/Progress Updates:    Patient agreeable to participate in OT session. Reports 0/10 pain level. Declined use of LSO during session.    Patient participated in skilled OT session focusing on bed mobility, sitting balance, and functional transfers in order to increase functional performance during self care tasks.   Pt donned socks at bed level using a figure 4 position. Increased time required for donning right foot sock verus left foot sock. Donned slip on shoes while seated EOB with physical assist to maintain sitting balance and  to stabilize left shoe.   Pt presents with decreased sitting balance requiring increased time, manual facilitation and verbal cues to correct and achieve an upright sitting balance. Pt able to maintain sitting balance for at least 30 seconds before fatiguing.   Completed standing pivot transfer using face to face method with Max Assist. Min A required for sit to stand transition. VC for technique and sequencing provided.   Education provided on use of lap tray on left side to support LUE and for safety d/t decreased attention and hemiparesis. Left side lap trays unavailable at this time. Pillow was used instead to provided UE support.    Therapy  Documentation Precautions:  Precautions Precautions: Fall Recall of Precautions/Restrictions: Intact Precaution/Restrictions Comments: L hemi Required Braces or Orthoses: Spinal Brace Spinal Brace: Other (comment) (LSO) Spinal Brace Comments: for comfort for L4 fx Restrictions Weight Bearing Restrictions Per Provider Order: No  Therapy/Group: Individual Therapy  Leita Howell, OTR/L,CBIS  Supplemental OT - [facility] and [facility] Secure Chat Preferred   09/25/2024, 11:25 AM

## 2024-09-25 NOTE — Progress Notes (Signed)
 "                                                        PROGRESS NOTE   Subjective/Complaints:  No new issues, pt feels washed out, sleeping poorly , bowels were keeping her up but his is improving    ROS: Positives per HPI above. Denies fevers, chills, N/V, abdominal pain, SOB, chest pain, new weakness or paraesthesias.   + Diarrhea  Objective:   No results found.  Recent Labs    09/25/24 0510  WBC 8.0  HGB 8.5*  HCT 24.4*  PLT 113*    Recent Labs    09/24/24 0512 09/25/24 0510  NA 126* 130*  K 4.3 4.0  CL 94* 99  CO2 24 25  GLUCOSE 90 90  BUN 30* 16  CREATININE 1.03* 0.63  CALCIUM  7.8* 8.0*    Intake/Output Summary (Last 24 hours) at 09/25/2024 0925 Last data filed at 09/25/2024 9271 Gross per 24 hour  Intake 1110.39 ml  Output --  Net 1110.39 ml        Physical Exam: Vital Signs Blood pressure (!) 114/42, pulse 76, temperature 98.7 F (37.1 C), temperature source Oral, resp. rate 16, height 5' 3 (1.6 m), weight 56.5 kg, SpO2 99%.   General: No acute distress.  Laying in bed. Awake, alert, mood and affect are appropriate. Heart: Regular rate and rhythm no rubs murmurs or extra sounds Lungs: Clear to auscultation, breathing unlabored, no rales or wheezes Abdomen: Positive bowel sounds, non-tender to palpation, nondistended Extremities: No clubbing, cyanosis, or edema Skin: No evidence of breakdown, no evidence of rash  Neurologic:  Awake, alert.  Oriented to self, place, and year. Moderately dysarthric--much improved 1-18 Cranial nerves II through XII intact, moving all 4 extremities in bed antigravity against resistance.   Sensory exam normal sensation to light touch and proprioception in bilateral upper and lower extremities Cerebellar exam dysmetria left finger nose finger  Musculoskeletal: Full range of motion in all 4 extremities. No joint swelling   Assessment/Plan: 1. Functional deficits which require 3+ hours per day of interdisciplinary  therapy in a comprehensive inpatient rehab setting. Physiatrist is providing close team supervision and 24 hour management of active medical problems listed below. Physiatrist and rehab team continue to assess barriers to discharge/monitor patient progress toward functional and medical goals  Care Tool:  Bathing    Body parts bathed by patient: Left arm, Chest, Abdomen, Front perineal area, Face   Body parts bathed by helper: Right arm, Buttocks, Right upper leg, Left upper leg, Left lower leg, Right lower leg     Bathing assist Assist Level: Maximal Assistance - Patient 24 - 49%     Upper Body Dressing/Undressing Upper body dressing   What is the patient wearing?: Pull over shirt, Bra    Upper body assist Assist Level: Minimal Assistance - Patient > 75%    Lower Body Dressing/Undressing Lower body dressing      What is the patient wearing?: Pants, Underwear/pull up     Lower body assist Assist for lower body dressing: Moderate Assistance - Patient 50 - 74%     Toileting Toileting    Toileting assist Assist for toileting: Maximal Assistance - Patient 25 - 49%     Transfers Chair/bed transfer  Transfers assist  Chair/bed transfer assist level: Maximal Assistance - Patient 25 - 49%     Locomotion Ambulation   Ambulation assist      Assist level: Moderate Assistance - Patient 50 - 74% Assistive device: Other (comment) (R hand rail) Max distance: 30'   Walk 10 feet activity   Assist     Assist level: Moderate Assistance - Patient - 50 - 74% Assistive device: Other (comment) (R hand rail)   Walk 50 feet activity   Assist    Assist level: Maximal Assistance - Patient 25 - 49% Assistive device: Walker-Eva    Walk 150 feet activity   Assist Walk 150 feet activity did not occur: Safety/medical concerns         Walk 10 feet on uneven surface  activity   Assist Walk 10 feet on uneven surfaces activity did not occur: Safety/medical  concerns         Wheelchair     Assist Is the patient using a wheelchair?: Yes Type of Wheelchair: Manual    Wheelchair assist level: Total Assistance - Patient < 25% Max wheelchair distance: 37'    Wheelchair 50 feet with 2 turns activity    Assist        Assist Level: Total Assistance - Patient < 25%   Wheelchair 150 feet activity     Assist      Assist Level: Total Assistance - Patient < 25%   Blood pressure (!) 114/42, pulse 76, temperature 98.7 F (37.1 C), temperature source Oral, resp. rate 16, height 5' 3 (1.6 m), weight 56.5 kg, SpO2 99%.  Medical Problem List and Plan: 1. Functional deficits secondary to right thalamic intraparenchymal hemorrhage             -patient may shower             -ELOS/Goals: 12-16 days MinA             -Stable to continue CIR   2.  Antithrombotics: -DVT/anticoagulation:  Pharmaceutical: Lovenox              -antiplatelet therapy: N/A due to thalamic hemorrhage 3. Pain Management: Tylenol  prn mild pain and oxycodone  prn severe pain             --will add K pad for local measures. Voltaren gel to heel   4. Mood/Behavior/Sleep:  LCSW to follow for evaluation and support.             --Melatonin prn for insomnia             -antipsychotic agents: N/A    5. Neuropsych/cognition: This patient is capable of making decisions on her own behalf. 6. Skin/Wound Care: Routine pressure relief measures.              --TLSO d/c due to excessive bruising with use.              --  - Bruising stable  1-17: Add Prevalon boots to bilateral feet to protect heels  7. Fluids/Electrolytes/Nutrition: Monitor I/O. Continue nutritional supplements 8. Hypotension: decrease cozaar  to 12.5mg  after supper   - 1-17: Poor p.o. intakes due to abdominal pain/excessive bowel movements today.  BP 90s over 40s this evening.  Will give some IV fluids 500 cc at 125/h and hold Cozaar  1-18: BP remains soft, responded to fluid bolus, likely losses  from excessive bowel movements and poor p.o. intakes.  Start gentle IV fluids 50 cc/h today.  Vitals:   09/24/24 2158 09/25/24 0308  BP: (!) 110/54 (!) 114/42  Pulse: 73 76  Resp: 16 16  Temp: 98.8 F (37.1 C) 98.7 F (37.1 C)  SpO2: 100% 99%  BPs ok, check orthostatic vitals Po intake improving   9.  Chronic hyponatremia: Continue to monitor for stability             --baseline 130 going per chart review  - 132 1-15  1-18: Sodium 126, likely hypovolemic, start gentle IV fluid normal saline 50 cc/h as above.   1/19 improved to 130 baseline is 130-135 range over the last 5+ years  10. L4 Compression Fx: Has spinal stenosis with impingement of L4 and L5 nerves per imaging --Chronic L1 compression Fx s/p kyphoplasty.  LSO was used at [facility] but not being used at the hospital per daughter --Will order for support.    11.  Chronic diastolic HF: Monitor for signs of overload. Check daily weighs.              --Used lasix  prn PTA   - Weight stable, no signs of volume overload 1-18 Filed Weights   09/23/24 0413 09/24/24 0550 09/25/24 0509  Weight: 56.7 kg 56.6 kg 56.5 kg      12. Hx of Sjogren syndrome/cutaneous Lupus:On low dose prednisone .  --will resume Methotrexate  per recommendations--every Sunday per family --Eye drops qid with lacrilube at nights.    13. Anemia of chronic disease: Baseline Hgb around 9             --Was transfused w/one unit on 12/23 and stable. Monitor for signs of bleeding             --no signs of bleeding , Hgb down 1 gm but may be hemodilution       Latest Ref Rng & Units 09/25/2024    5:10 AM 09/21/2024    4:46 AM 09/14/2024   11:38 AM  CBC  WBC 4.0 - 10.5 K/uL 8.0  4.3  3.5   Hemoglobin 12.0 - 15.0 g/dL 8.5  9.4  9.5   Hematocrit 36.0 - 46.0 % 24.4  27.0  29.3   Platelets 150 - 400 K/uL 113  159  213     14.H/o Depression: continue Zoloft.    15. Hx of Left foot ankle pain: Calcaneal enthesopathy with tendinosis distal Achilles tendon.     16.  Hypothyroid s/p thyroidectomy: continue supplement.     17 .  Constipation - will give SMOG enema today   - 1-19 4 BMs (2 incont) last BM 1/18 at 2100  LOS: 5 days A FACE TO FACE EVALUATION WAS PERFORMED  Prentice FORBES Compton 09/25/2024, 9:25 AM     "

## 2024-09-25 NOTE — Plan of Care (Signed)
" °  Problem: Consults Goal: RH STROKE PATIENT EDUCATION Description: See Patient Education module for education specifics  Outcome: Progressing   Problem: RH BOWEL ELIMINATION Goal: RH STG MANAGE BOWEL WITH ASSISTANCE Description: STG Manage Bowel with supervision Assistance. Outcome: Progressing   Problem: RH BLADDER ELIMINATION Goal: RH STG MANAGE BLADDER WITH ASSISTANCE Description: STG Manage Bladder With supervision Assistance Outcome: Progressing   Problem: RH SKIN INTEGRITY Goal: RH STG SKIN FREE OF INFECTION/BREAKDOWN Description: Manage skin free of infection with supervision assistance Outcome: Progressing   Problem: RH SAFETY Goal: RH STG ADHERE TO SAFETY PRECAUTIONS W/ASSISTANCE/DEVICE Description: STG Adhere to Safety Precautions With Assistance/Device. Outcome: Progressing   Problem: RH PAIN MANAGEMENT Goal: RH STG PAIN MANAGED AT OR BELOW PT'S PAIN GOAL Description: <4 w/ prns Outcome: Progressing   Problem: RH KNOWLEDGE DEFICIT Goal: RH STG INCREASE KNOWLEDGE OF HYPERTENSION Description: Manage increase knowledge of hypertension with supervision assistance from daughter using educational materials provided Outcome: Progressing Goal: RH STG INCREASE KNOWLEGDE OF HYPERLIPIDEMIA Description: Manage increase knowledge of hyperlipidemia  with supervision assistance from daughter using educational materials provided Outcome: Progressing   "

## 2024-09-26 DIAGNOSIS — G8194 Hemiplegia, unspecified affecting left nondominant side: Secondary | ICD-10-CM | POA: Diagnosis not present

## 2024-09-26 DIAGNOSIS — I610 Nontraumatic intracerebral hemorrhage in hemisphere, subcortical: Secondary | ICD-10-CM | POA: Diagnosis not present

## 2024-09-26 MED ORDER — MECLIZINE HCL 25 MG PO TABS
12.5000 mg | ORAL_TABLET | Freq: Three times a day (TID) | ORAL | Status: DC | PRN
  Administered 2024-09-26 – 2024-10-05 (×6): 12.5 mg via ORAL
  Filled 2024-09-26 (×6): qty 1

## 2024-09-26 MED ORDER — PROCHLORPERAZINE MALEATE 5 MG PO TABS
5.0000 mg | ORAL_TABLET | Freq: Once | ORAL | Status: AC
  Administered 2024-09-26: 5 mg via ORAL
  Filled 2024-09-26: qty 1

## 2024-09-26 MED ORDER — WITCH HAZEL-GLYCERIN EX PADS
MEDICATED_PAD | CUTANEOUS | Status: DC | PRN
  Filled 2024-09-26: qty 100

## 2024-09-26 MED ORDER — ONDANSETRON 4 MG PO TBDP
4.0000 mg | ORAL_TABLET | Freq: Every day | ORAL | Status: DC
  Administered 2024-09-27 – 2024-10-08 (×12): 4 mg via ORAL
  Filled 2024-09-26 (×14): qty 1

## 2024-09-26 MED ORDER — POLYETHYLENE GLYCOL 3350 17 G PO PACK
17.0000 g | PACK | Freq: Every day | ORAL | Status: DC
  Administered 2024-09-26 – 2024-09-30 (×5): 17 g via ORAL
  Filled 2024-09-26 (×4): qty 1

## 2024-09-26 MED ORDER — PROCHLORPERAZINE MALEATE 5 MG PO TABS
5.0000 mg | ORAL_TABLET | Freq: Every day | ORAL | Status: DC
  Administered 2024-09-27 – 2024-09-28 (×2): 5 mg via ORAL
  Filled 2024-09-26 (×2): qty 1

## 2024-09-26 MED ORDER — POLYETHYLENE GLYCOL 3350 17 G PO PACK: 17.0000 g | PACK | Freq: Every day | ORAL | Status: DC

## 2024-09-26 MED ORDER — MAGNESIUM GLUCONATE 500 (27 MG) MG PO TABS
250.0000 mg | ORAL_TABLET | Freq: Every day | ORAL | Status: DC
  Administered 2024-09-27 – 2024-10-07 (×11): 250 mg via ORAL
  Filled 2024-09-26 (×11): qty 1

## 2024-09-26 NOTE — Progress Notes (Signed)
 Occupational Therapy Session Note  Patient Details  Name: Kim Santiago MRN: [id number] Date of Birth: 1935-06-21  Today's Date: 09/26/2024 OT Individual Time: 8890-8797 OT Individual Time Calculation (min): 53 min    Short Term Goals: Week 1:  OT Short Term Goal 1 (Week 1): Pt will maintain static sitting balance in preparation for transfers with SUP OT Short Term Goal 2 (Week 1): Pt will complete toilet transfers with MOD A OT Short Term Goal 3 (Week 1): Pt will recall hemi-dressing techniques with MIN questioning cues OT Short Term Goal 4 (Week 1): Pt will position L UE in bed or on tray table with SUP OT Short Term Goal 5 (Week 1): Pt complete LB dressing with MIN A  Skilled Therapeutic Interventions/Progress Updates:     Pt received resting in bed presenting to be in good spirits, tired receptive to skilled OT session reporting 0/10 pain- OT offering intermittent rest breaks, repositioning, and therapeutic support to optimize participation in therapy session. Pt reporting nausea symptoms requesting to stay in bed for therapy session. RN in/out to provide nausea medications, however Pt continued to request bed level activities only. Pt able to utilized bed features to assist with pulling self to [facility] for improved positioning in bed. Engaged Pt in functional reaching activities to facilitate work on targeted reach, maintained grasp, and coordination. Worked on stacking and transporting cones using L UE and retrieving cones from varies heights- Pt able to complete with increased time and effort with MOD under/overshooting. Pt then tasked with completing [facility] card sorting activity to work on translation and pharmacist, community. Increased effort and time required to flip cards and decreased coordination noted when sorting into separate piles. Pt then completed 1x10 reps of bicep curls, chest press, and anterior chest flexion using 1# weighted dowel to work on reciprocal B UE movements-  increased challenges noted grading force required to lift dowel in all plane using L UE and slowed motor movements. Pt was left resting in bed with call bell in reach, bed alarm on, and all needs met.    Therapy Documentation Precautions:  Precautions Precautions: Fall Recall of Precautions/Restrictions: Intact Precaution/Restrictions Comments: L hemi Required Braces or Orthoses: Spinal Brace Spinal Brace: Other (comment) (LSO) Spinal Brace Comments: for comfort for L4 fx Restrictions Weight Bearing Restrictions Per Provider Order: No   Therapy/Group: Individual Therapy  Kim Santiago 09/26/2024, 7:31 AM

## 2024-09-26 NOTE — Plan of Care (Signed)
  Problem: RH SKIN INTEGRITY Goal: RH STG SKIN FREE OF INFECTION/BREAKDOWN Description: Manage skin free of infection with supervision assistance Outcome: Progressing   Problem: RH SAFETY Goal: RH STG ADHERE TO SAFETY PRECAUTIONS W/ASSISTANCE/DEVICE Description: STG Adhere to Safety Precautions With Assistance/Device. Outcome: Progressing   Problem: RH PAIN MANAGEMENT Goal: RH STG PAIN MANAGED AT OR BELOW PT'S PAIN GOAL Description: <4 w/ prns Outcome: Progressing

## 2024-09-26 NOTE — Progress Notes (Signed)
 Physical Therapy Session Note  Patient Details  Name: Kim Santiago MRN: [id number] Date of Birth: May 12, 1935  Today's Date: 09/26/2024 PT Individual Time: 1031-1100 PT Individual Time Calculation (min): 29 min   Short Term Goals: Week 1:  PT Short Term Goal 1 (Week 1): Pt will complete bed mobility with minA PT Short Term Goal 2 (Week 1): Pt will complete bed<>chair transfers with modA and LRAD PT Short Term Goal 3 (Week 1): Pt will ambulate 28' with modA and LRAD PT Short Term Goal 4 (Week 1): Pt will improve BERG score by at least 8 points  Skilled Therapeutic Interventions/Progress Updates:  Patient supine in bed on entrance to room. Patient alert and agreeable to PT session.   Patient with no pain complaint at start of session. Is nervous to move as she is usually still complaining of dizziness into standing. Is ready to test out new dizziness medication - but slowly.   Therapeutic Activity: Bed Mobility: Pt performed supine > sit with Mod/MinA to push up from sidelying. Requires 2 attempts with repositioning of hand and Mod/ MinA. Dressed while seated EOB with sweatshirt. With forward scoot and placement of feet on floor, pt able to maintain seated balance with no physical assist.   Neuromuscular Re-ed: NMR facilitated during session with focus on sitting balance, midline orientation. Pt guided in seated repositioning of posture in order to stack spine and improve motor control. With BLE on floor, pt able to maintain seated balance with no physical assist. Does demo forward flexed posture initially but is able to correct with MinA improving to SBA and vc. Maintains seated balance with minimal reaching task (limited pt's lean in order to control nausea). Some L lean initially but corrects with vc only.   Pt distracted with conversation and has no complaint of nausea this session.   NMR performed for improvements in motor control and coordination, balance, sequencing, judgement, and  self confidence/ efficacy in performing all aspects of mobility at highest level of independence.   Patient supine in bed at end of session with brakes locked, bed alarm set, and all needs within reach. Appreciative of session with no nausea.   Therapy Documentation Precautions:  Precautions Precautions: Fall Recall of Precautions/Restrictions: Intact Precaution/Restrictions Comments: L hemi Required Braces or Orthoses: Spinal Brace Spinal Brace: Other (comment) (LSO) Spinal Brace Comments: for comfort for L4 fx Restrictions Weight Bearing Restrictions Per Provider Order: No  Pain: No pain related this session.   Therapy/Group: Individual Therapy  Mliss DELENA Milliner PT, DPT, CSRS 09/26/2024, 6:13 PM

## 2024-09-26 NOTE — Progress Notes (Signed)
 Physical Therapy Session Note  Patient Details  Name: SYDNEI OHAVER MRN: [id number] Date of Birth: 1934-11-24  Today's Date: 09/26/2024 PT Individual Time: 0320-0405 PT Individual Time Calculation (min): 45 min   Short Term Goals: Week 1:  PT Short Term Goal 1 (Week 1): Pt will complete bed mobility with minA PT Short Term Goal 2 (Week 1): Pt will complete bed<>chair transfers with modA and LRAD PT Short Term Goal 3 (Week 1): Pt will ambulate 33' with modA and LRAD PT Short Term Goal 4 (Week 1): Pt will improve BERG score by at least 8 points  Skilled Therapeutic Interventions/Progress Updates:  Patient supine in bed on entrance to room. Patient alert and agreeable to PT session.   Prior to session, PR requests orthostatic VS to manage hypotension. See list below.  Patient with no pain complaint at start of session.  Pt in supine iniitally but is able to bring LLE out of bed with SBA. Requires HHA and pt can pull on therapist arm in order to assist in rising up to seated position. Is able to hold seated position for brief moments and requires vc/ tc for attn to leans in all directions. Ultimately is able to hold self upright in seated position with overall SBA. Sits well to midline once balance is attained. Holds self upright with minimal cues. BP taken upon reaching seated position and then after. Pt is able to scoot along EOB to R side with SBA.   Rise to stand occurs with +2 to RUE and this PT to LUE and pt with improving technique in rise to stand. Initially LLE demos weakness but with vc is able to perform TKE. During stance for vital signs, pt is able to demo intermittent quad activation for L knee extension with and without vc. Guard provided with LUE support. Pt is able to stand for total of . Then side steps with vc only and BUE support of MinA +2 to step to L along EOB. No assist required for LE movement. Descends to sit with slow motion and MinA.   No complaint of n/v  this session!  Patient supine in bed at end of session with brakes locked, bed alarm set, and all needs within reach.   Therapy Documentation Precautions:  Precautions Precautions: Fall Recall of Precautions/Restrictions: Intact Precaution/Restrictions Comments: L hemi Required Braces or Orthoses: Spinal Brace Spinal Brace: Other (comment) (LSO) Spinal Brace Comments: for comfort for L4 fx Restrictions Weight Bearing Restrictions Per Provider Order: No General:   Vital Signs: Therapy Vitals Patient Position (if appropriate): Orthostatic Vitals ORTHOSTATIC VITAL SIGNS  BP taken in SUPINE: BP taken immediately in SITTING: BP taken in SITTING after : BP taken immediately upon STANDING: BP taken in STANDING after :   Pain: No complaint of pain this session.    Therapy/Group: Individual Therapy  Mliss DELENA Milliner PT, DPT, CSRS 09/26/2024, 6:14 PM

## 2024-09-26 NOTE — Progress Notes (Signed)
 Speech Language Pathology Daily Session Note  Patient Details  Name: Kim Santiago MRN: [id number] Date of Birth: 1935/04/21  Today's Date: 09/26/2024 SLP Individual Time: 0900-0957 SLP Individual Time Calculation (min): 57 min  Short Term Goals: Week 1: SLP Short Term Goal 1 (Week 1): Patient will participate in MBS to assess oropharyngeal swallowing mechanism SLP Short Term Goal 2 (Week 1): Patient will recall and utilize memory compensatory strategies with min multimodal A SLP Short Term Goal 3 (Week 1): Patient will demonstrate mildly complex problem solving skills in functional tasks given min multimodal a  Skilled Therapeutic Interventions: Skilled therapy session focused on cognitive goals. SLP facilitated session by targeting problem solving through simple money management task. Patient counted change according to verbalized amount with 90% accuracy independently. SLP continued to challenged patient through check balancing task. Patient completed addition/subtraction with sipervision-minA. Patient left in bed with alarm set and call bell in reach. Continue POC  Pain denies  Therapy/Group: Individual Therapy  Erion Hermans M.A., CCC-SLP 09/26/2024, 7:42 AM

## 2024-09-26 NOTE — Progress Notes (Signed)
 "                                                        PROGRESS NOTE   Subjective/Complaints:  SLept a little better but was up since 3am.  Took a OTC sleep aid with melatonin, Magnesium  , valerain , camomile and several other ingredients PTA. Cuurrently has melatonin on order   Has nausea in ams asking for meds  ROS: Positives per HPI above. Denies fevers, chills, vomiting , abdominal pain, SOB, chest pain, new weakness or paraesthesias.   + Diarrhea  Objective:   No results found.  Recent Labs    09/25/24 0510  WBC 8.0  HGB 8.5*  HCT 24.4*  PLT 113*    Recent Labs    09/24/24 0512 09/25/24 0510  NA 126* 130*  K 4.3 4.0  CL 94* 99  CO2 24 25  GLUCOSE 90 90  BUN 30* 16  CREATININE 1.03* 0.63  CALCIUM  7.8* 8.0*    Intake/Output Summary (Last 24 hours) at 09/26/2024 0907 Last data filed at 09/25/2024 1847 Gross per 24 hour  Intake 360 ml  Output --  Net 360 ml        Physical Exam: Vital Signs Blood pressure (!) 107/52, pulse 68, temperature 98.4 F (36.9 C), temperature source Oral, resp. rate 17, height 5' 3 (1.6 m), weight 57.5 kg, SpO2 100%.   General: No acute distress.  Laying in bed. Awake, alert, mood and affect are appropriate. Heart: Regular rate and rhythm no rubs murmurs or extra sounds Lungs: Clear to auscultation, breathing unlabored, no rales or wheezes Abdomen: Positive bowel sounds, non-tender to palpation, nondistended Extremities: No clubbing, cyanosis, or edema Skin: No evidence of breakdown, no evidence of rash  Neurologic:  Awake, alert.  Oriented to self, place, and year. Moderately dysarthric-- Cranial nerves II through XII intact, moving all 4 extremities in bed antigravity against resistance.   Fine motor finger to thumb mildly reduced on left side  Sensory exam normal sensation to light touch and proprioception in bilateral upper and lower extremities Cerebellar exam dysmetria left finger nose finger  Musculoskeletal:  Full range of motion in all 4 extremities. No joint swelling   Assessment/Plan: 1. Functional deficits which require 3+ hours per day of interdisciplinary therapy in a comprehensive inpatient rehab setting. Physiatrist is providing close team supervision and 24 hour management of active medical problems listed below. Physiatrist and rehab team continue to assess barriers to discharge/monitor patient progress toward functional and medical goals  Care Tool:  Bathing    Body parts bathed by patient: Left arm, Chest, Abdomen, Front perineal area, Face   Body parts bathed by helper: Right arm, Buttocks, Right upper leg, Left upper leg, Left lower leg, Right lower leg     Bathing assist Assist Level: Maximal Assistance - Patient 24 - 49%     Upper Body Dressing/Undressing Upper body dressing   What is the patient wearing?: Pull over shirt, Bra    Upper body assist Assist Level: Minimal Assistance - Patient > 75%    Lower Body Dressing/Undressing Lower body dressing      What is the patient wearing?: Pants, Underwear/pull up     Lower body assist Assist for lower body dressing: Moderate Assistance - Patient 50 - 74%     Toileting  Toileting    Toileting assist Assist for toileting: Maximal Assistance - Patient 25 - 49%     Transfers Chair/bed transfer  Transfers assist     Chair/bed transfer assist level: Maximal Assistance - Patient 25 - 49%     Locomotion Ambulation   Ambulation assist      Assist level: Moderate Assistance - Patient 50 - 74% Assistive device: Other (comment) (R hand rail) Max distance: 30'   Walk 10 feet activity   Assist     Assist level: Moderate Assistance - Patient - 50 - 74% Assistive device: Other (comment) (R hand rail)   Walk 50 feet activity   Assist    Assist level: Maximal Assistance - Patient 25 - 49% Assistive device: Walker-Eva    Walk 150 feet activity   Assist Walk 150 feet activity did not occur:  Safety/medical concerns         Walk 10 feet on uneven surface  activity   Assist Walk 10 feet on uneven surfaces activity did not occur: Safety/medical concerns         Wheelchair     Assist Is the patient using a wheelchair?: Yes Type of Wheelchair: Manual    Wheelchair assist level: Total Assistance - Patient < 25% Max wheelchair distance: 3'    Wheelchair 50 feet with 2 turns activity    Assist        Assist Level: Total Assistance - Patient < 25%   Wheelchair 150 feet activity     Assist      Assist Level: Total Assistance - Patient < 25%   Blood pressure (!) 107/52, pulse 68, temperature 98.4 F (36.9 C), temperature source Oral, resp. rate 17, height 5' 3 (1.6 m), weight 57.5 kg, SpO2 100%.  Medical Problem List and Plan: 1. Functional deficits secondary to right thalamic intraparenchymal hemorrhage             -patient may shower             -ELOS/Goals: 12-16 days MinA             -Stable to continue CIR   2.  Antithrombotics: -DVT/anticoagulation:  Pharmaceutical: Lovenox              -antiplatelet therapy: N/A due to thalamic hemorrhage 3. Pain Management: Tylenol  prn mild pain and oxycodone  prn severe pain             --will add K pad for local measures. Voltaren gel to heel  L4 comp fx acute as well as Lumbar spinal stenosis  4. Mood/Behavior/Sleep:  LCSW to follow for evaluation and support.             --Melatonin prn for insomnia             -antipsychotic agents: N/A    5. Neuropsych/cognition: This patient is capable of making decisions on her own behalf. 6. Skin/Wound Care: Routine pressure relief measures.              --TLSO d/c due to excessive bruising with use.              --  - Bruising stable  1-17: Add Prevalon boots to bilateral feet to protect heels  7. Fluids/Electrolytes/Nutrition: Monitor I/O. Continue nutritional supplements 8. Hypotension: decrease cozaar  to 12.5mg  after supper   - 1-17: Poor p.o.  intakes due to abdominal pain/excessive bowel movements today.  BP 90s over 40s this evening.  Will give some IV fluids 500 cc  at 125/h and hold Cozaar  1-18: BP remains soft, responded to fluid bolus, likely losses from excessive bowel movements and poor p.o. intakes.  Start gentle IV fluids 50 cc/h today.  Vitals:   09/25/24 2013 09/26/24 0501  BP: (!) 103/58 (!) 107/52  Pulse: 74 68  Resp: 18 17  Temp: 98.7 F (37.1 C) 98.4 F (36.9 C)  SpO2: 99% 100%  BPs ok, check orthostatic vitals Po intake improving   9.  Chronic hyponatremia: Continue to monitor for stability             --baseline 130 going per chart review  - 132 1-15  1-18: Sodium 126, likely hypovolemic, start gentle IV fluid normal saline 50 cc/h as above.   1/19 improved to 130 baseline is 130-135 range over the last 5+ years    Latest Ref Rng & Units 09/25/2024    5:10 AM 09/24/2024    5:12 AM 09/21/2024    4:46 AM  BMP  Glucose 70 - 99 mg/dL 90  90  87   BUN 8 - 23 mg/dL 16  30  19    Creatinine 0.44 - 1.00 mg/dL 9.36  8.96  9.24   Sodium 135 - 145 mmol/L 130  126  132   Potassium 3.5 - 5.1 mmol/L 4.0  4.3  4.3   Chloride 98 - 111 mmol/L 99  94  99   CO2 22 - 32 mmol/L 25  24  26    Calcium  8.9 - 10.3 mg/dL 8.0  7.8  8.6     10. L4 Compression Fx: Has spinal stenosis with impingement of L4 and L5 nerves per imaging --Chronic L1 compression Fx s/p kyphoplasty.  LSO was used at [facility] but not being used at the hospital per daughter --Will order for support.    11.  Chronic diastolic HF: Monitor for signs of overload. Check daily weighs.              --Used lasix  prn PTA   - Weight stable, no signs of volume overload 1-18 Filed Weights   09/24/24 0550 09/25/24 0509 09/26/24 0500  Weight: 56.6 kg 56.5 kg 57.5 kg      12. Hx of Sjogren syndrome/cutaneous Lupus:On low dose prednisone .  --will resume Methotrexate  per recommendations--every Sunday per family --Eye drops qid with lacrilube at nights.    13. Anemia of  chronic disease: Baseline Hgb around 9             --Was transfused w/one unit on 12/23 and stable. Monitor for signs of bleeding             --no signs of bleeding , Hgb down 1 gm but may be hemodilution       Latest Ref Rng & Units 09/25/2024    5:10 AM 09/21/2024    4:46 AM 09/14/2024   11:38 AM  CBC  WBC 4.0 - 10.5 K/uL 8.0  4.3  3.5   Hemoglobin 12.0 - 15.0 g/dL 8.5  9.4  9.5   Hematocrit 36.0 - 46.0 % 24.4  27.0  29.3   Platelets 150 - 400 K/uL 113  159  213     14.H/o Depression: continue Zoloft.    15. Hx of Left foot ankle pain: Calcaneal enthesopathy with tendinosis distal Achilles tendon.     16. Hypothyroid s/p thyroidectomy: continue supplement.     17 .  Constipation -   - 1-19 incont BM last pm  LOS: 6 days A FACE TO FACE EVALUATION  WAS PERFORMED  Kim Santiago 09/26/2024, 9:07 AM     "

## 2024-09-27 DIAGNOSIS — G8194 Hemiplegia, unspecified affecting left nondominant side: Secondary | ICD-10-CM | POA: Diagnosis not present

## 2024-09-27 DIAGNOSIS — I610 Nontraumatic intracerebral hemorrhage in hemisphere, subcortical: Secondary | ICD-10-CM | POA: Diagnosis not present

## 2024-09-27 NOTE — Plan of Care (Signed)
" °  Problem: Consults Goal: RH STROKE PATIENT EDUCATION Description: See Patient Education module for education specifics  Outcome: Progressing   Problem: RH BOWEL ELIMINATION Goal: RH STG MANAGE BOWEL WITH ASSISTANCE Description: STG Manage Bowel with supervision Assistance. Outcome: Progressing   Problem: RH BLADDER ELIMINATION Goal: RH STG MANAGE BLADDER WITH ASSISTANCE Description: STG Manage Bladder With supervision Assistance Outcome: Progressing   Problem: RH SKIN INTEGRITY Goal: RH STG SKIN FREE OF INFECTION/BREAKDOWN Description: Manage skin free of infection with supervision assistance Outcome: Progressing   Problem: RH SAFETY Goal: RH STG ADHERE TO SAFETY PRECAUTIONS W/ASSISTANCE/DEVICE Description: STG Adhere to Safety Precautions With Assistance/Device. Outcome: Progressing   Problem: RH PAIN MANAGEMENT Goal: RH STG PAIN MANAGED AT OR BELOW PT'S PAIN GOAL Description: <4 w/ prns Outcome: Progressing   Problem: RH KNOWLEDGE DEFICIT Goal: RH STG INCREASE KNOWLEDGE OF HYPERTENSION Description: Manage increase knowledge of hypertension with supervision assistance from daughter using educational materials provided Outcome: Progressing Goal: RH STG INCREASE KNOWLEGDE OF HYPERLIPIDEMIA Description: Manage increase knowledge of hyperlipidemia  with supervision assistance from daughter using educational materials provided Outcome: Progressing   "

## 2024-09-27 NOTE — Progress Notes (Signed)
 Occupational Therapy Session Note  Patient Details  Name: Kim Santiago MRN: [id number] Date of Birth: 1934/11/13  Today's Date: 09/27/2024 OT Individual Time: 8896-8843 OT Individual Time Calculation (min): 53 min   Today's Date: 09/27/2024 OT Individual Time: 8597-8543 OT Individual Time Calculation (min): 54 min   Short Term Goals: Week 1:  OT Short Term Goal 1 (Week 1): Pt will maintain static sitting balance in preparation for transfers with SUP OT Short Term Goal 2 (Week 1): Pt will complete toilet transfers with MOD A OT Short Term Goal 3 (Week 1): Pt will recall hemi-dressing techniques with MIN questioning cues OT Short Term Goal 4 (Week 1): Pt will position L UE in bed or on tray table with SUP OT Short Term Goal 5 (Week 1): Pt complete LB dressing with MIN A  Skilled Therapeutic Interventions/Progress Updates:     Session 1: Pt received returning to bed with NT following using restroom presenting to be tired, however in good spirits reporting 0/10 pain- OT offering intermittent rest breaks, repositioning, and therapeutic support to optimize participation in therapy session. MOD therapeutic support and encouragement required to initiate participation in OT session. She transitioned to EOB with HOB elevated MIN A +MOD verbal cues for technique. Able to complete lateral leans to scoot to EOB with MIN A. Donned slip on shoes with set-up and back brace MAX A. Improved static sitting balance noted with SUP in preparation for transfer, verbal cues required for trunk positioning 2/2 L lean. Stand pivot EOB > wc to R heavy MOD A +MAX multimodal cues for sequencing, weight shifting, and L foot positioning. Positioned Pt at sink in front of mirror for visual feedback of body alignment. Worked on block practice of sit<>stands with emphasis on staying in midline, anterior weight shifting, and foot/hand positioning completing 3 trials with MIN A overall. Once in standing, worked on trunk control,  midline awareness, and targeted reach- Pt tasked with using L UE to retrieve squigz of various heights from mirror and transport then to opposite side of sink. She is able to maintain standing balance briefly with MIN A, however with fatigue Pt demonstrates increased L lean requiring consistent verbal cues to correct and up to MOD A. Able to utilize external visual aid to return to midline once cued to do so. She was able to maintain grasp on squigz without dropping. Pt very fatigued during session requiring frequent seated rest breaks and therapeutic support. She requested to return to bed at end of session. Stand pivot wc > EOB to L heavy MOD A +MAX multimodal cues. Pt was left resting in bed with call bell in reach, bed alarm on, and all needs met.    Session 2: Pt received sleeping in bed, waking upon OT arrival. Pt presenting to be tired, in good spirits receptive to skilled OT session reporting 0/10 pain- OT offering intermittent rest breaks, repositioning, and therapeutic support to optimize participation in therapy session. She transitioned to EOB with HOB elevated with min A to lift trunk. Increased L lean in sitting requiring MIN A for static sitting balance with B LEs positioned on floor- able to bring trunk to midline with verbal cues, however quickly fatigues leaning to L. Education provided on stand pivot technique and safety using RW. Pt required MOD A for power up into standing and MAX verbal/tactile cues for trunk alignment and L foot positioning. Pt tends to move quickly, shuffling feet, leaning to L, and maintaining gaze down. In therapy gym, engaged  Pt in completing additional stand pivot transfers to learn technique. She required MAX verbal cues, MIN A to the R, and MOD A to the L d/t increased challenges stepping L LE and weight shifting to R 2/2 L lean. Pt noted to push to L in standing, able to correct with verbal/tactile cues. Pt completed dynamic standing balance functional reaching  activity on BITs alternating reaching with R/L UE to touch moving targeted with opposite UE supported on RW. MIN-MOD A required for standing balance +consistent verbal cues to facilitate upright posture in midline. Pt fatigued after ~2 min in standing completing remainder of activity in seated position for energy conservation to work on L UE targeted reach and anterior trunk flexion. MOD dysmetria present during functional reach. Transported Pt back to room in wc. Stand pivot wc > EOB using RW MIN A. EOB > supine MIN A +Max verbal cues for technique. Pt was left resting in bed with call bell in reach, bed alarm on, and all needs met.    Therapy Documentation Precautions:  Precautions Precautions: Fall Recall of Precautions/Restrictions: Intact Precaution/Restrictions Comments: L hemi Required Braces or Orthoses: Spinal Brace Spinal Brace: Other (comment) (LSO) Spinal Brace Comments: for comfort for L4 fx Restrictions Weight Bearing Restrictions Per Provider Order: No  Therapy/Group: Individual Therapy  [name] 09/27/2024, 7:28 AM

## 2024-09-27 NOTE — Progress Notes (Addendum)
 Patient ID: Kim Santiago, female   DOB: 12-13-1934, 89 y.o.   MRN: [id number]  Met with pt to give team conference update with goals of min level and target discharge date of 2/4. Left message for daughter-Christy but she has left to teach her [employer] class and will be back later this afternoon. Pt is feeling less nauseous and able to get out of bed now which is good so progress can be made. Pt has a good attitude and keeps on plugging away. Await daughter's return call  3:14 pm Spoke with daughter via telephone to give team conference update she is concerned she may not be able to provide the care she will need at discharge. Discussed her insurance is not one who covers SNF after CIR. Daughter was told this and wanted her to have her best shot here. Have encouraged daughter to attend therapies with which she has but it has been so limited due to nausea and bowel issues for the past six days. Will work along with her and see what she can do for pt at discharge. Discussed can try to get coverage but probably not cover her to go back to SNF.  Continue to work on discharge.

## 2024-09-27 NOTE — Patient Care Conference (Cosign Needed)
 Inpatient RehabilitationTeam Conference and Plan of Care Update Date: 09/27/2024   Time: 10:39 AM    Patient Name: Kim Santiago      Medical Record Number: [id number]  Date of Birth: May 17, 1935 Sex: Female         Room/Bed: 4W09C/4W09C-01 Payor Info: Payor: [employer] / Plan: [employer] PPO / Product Type: *No Product type* /    Admit Date/Time:  09/20/2024 12:39 PM  Primary Diagnosis:  Thalamic hemorrhage [facility])  Hospital Problems: Principal Problem:   Thalamic hemorrhage [facility])    Expected Discharge Date: Expected Discharge Date: 10/11/24  Team Members Present: Physician leading conference: Dr. Prentice Compton Social Worker Present: Rhoda Clement, LCSW Nurse Present: Barnie Ronde, RN PT Present: Recardo Milliner, PT OT Present: Katheryn Mines, OT SLP Present: Recardo Cowing, SLP PPS Coordinator present : Eleanor Colon, SLP     Current Status/Progress Goal Weekly Team Focus  Bowel/Bladder   Patient is incontinent of bowel and bladder. LBM 09/26/24   Patient will have decrease in episodes of incontinence   Bladder and bowel program to decrease episodes on incontinence.    Swallow/Nutrition/ Hydration   reg/thin, meds whole in puree   supervision  tolerance of diet, use of strategies including sitting upright during and after PO due to suspected slowed esophageal motility    ADL's   UB ADL CGA, LB ADL MOD A, toileting MAX A, stand/squat pivot transfers MAX A // Barriers: limited by nausea and fatigue, decreased activity tolerance, decreased trunk control, decreased proprioception in L hemibody, mild L inattention, mild cognitive deficits   CGA toilet transfer, MIN A shower transfers, MIN A bathing/toileting, CGA U/LB dressing   activity tolerance, sitting balance, sit<>stands, L hemibody NMRE, Pt/family education, L attention    Mobility   Bed mobility = MinA; Transfers = Min/ ModA for transfer but MaxA for balance; Gait training = ModA for balance and max  cues for sequencing/ coordination up to 20-25 ft.   supervision/ CGAfor bed mobility / transfer, MinA for ambulation  Barriers: initial constipation resolved but will need to return to nightly reginmen of Miralax , continued nausea preventing mobility, L hemipareisis/ weakness /// Work on: L web designer coordination, increased safety awareness, LOA during bed mobility, transfers, must initiate gait training this week, family education    Communication                Safety/Cognition/ Behavioral Observations  minA   supervisionA   memory, problem solving, increasing processing speed    Pain   Patient reporting pain in Head ranging from 3 to 10 on pain scale.   The patient will report pain level less than 3   Utilize non pharmaceutical methods to reduce pain, and offer analgesics as ordered for pain control    Skin   Patient with ecchymosis and erythema to buttocks, labia, neck,arms, neck, and chest.   Patient will have no open areas and decrease in redness and ecchymosis  Applying cream to areas per order, Q2 turn, and reduction in pressure areas.      Discharge Planning:  Home with daughter and son in-law who between them can provide care. Daughter does teach two classes at [employer] twice a week. Will work on care and provide resources to them await team's receommendations   Team Discussion: Patient admitted post thalamic hemorrhage with nausea, poor coordination, left side inattention and balance issues.  Patient on target to meet rehab goals: yes, currently needs min assist for bed mobility and transfers. Needs  max assist for stand pivot transfers with mod assist to ambulate up to 20'. Needs mod assist for lower body care and max assist for toileting.  Patient with esophogeal motility issues. Needs min assist for cognition and processing speed.  Goals for discharge set for  CGA - min assist overall and supervision for cognition.  *See Care Plan and progress notes for long  and short-term goals.   Revisions to Treatment Plan:  N/a   Teaching Needs: Safety, medications, transfers, toileting, etc.   Current Barriers to Discharge: Decreased caregiver support and Home enviroment access/layout  Possible Resolutions to Barriers: Family education     Medical Summary Current Status: Coordination deficits and poor balance related to stroke.  Chronic constipation with recent increase laxative use causing diarrhea     Possible Resolutions to [employer] Focus: Continue to manage bowel program, monitor anemia   Continued Need for Acute Rehabilitation Level of Care: The patient requires daily medical management by a physician with specialized training in physical medicine and rehabilitation for the following reasons: Direction of a multidisciplinary physical rehabilitation program to maximize functional independence : Yes Medical management of patient stability for increased activity during participation in an intensive rehabilitation regime.: Yes Analysis of laboratory values and/or radiology reports with any subsequent need for medication adjustment and/or medical intervention. : Yes   I attest that I was present, lead the team conference, and concur with the assessment and plan of the team.   Fredericka Sober B 09/27/2024, 2:25 PM

## 2024-09-27 NOTE — Progress Notes (Signed)
 Speech Language Pathology Daily Session Note  Patient Details  Name: ALIZEE MAPLE MRN: [id number] Date of Birth: 1935-06-22  Today's Date: 09/27/2024 SLP Individual Time: 9199-9168 SLP Individual Time Calculation (min): 31 min  Short Term Goals: Week 1: SLP Short Term Goal 1 (Week 1): Patient will participate in MBS to assess oropharyngeal swallowing mechanism SLP Short Term Goal 2 (Week 1): Patient will recall and utilize memory compensatory strategies with min multimodal A SLP Short Term Goal 3 (Week 1): Patient will demonstrate mildly complex problem solving skills in functional tasks given min multimodal a  Skilled Therapeutic Interventions:  Patient was seen in am to address cognitive re- training. Pt was alert and seen at bedside with her dtr present. Pt denied pain and was agreeable for session. SLP introduced WRAP memory strategies and examples of utilization. Pt subsequently challenged in a paragraph retention task where she was presented with a moderate level paragraph verbally. SLP guided pt in utilization of repetition and association strategies for recall. After a 6 minute distracted delay, pt recalled information with 100% acc mod I. In other minutes of session, SLP addressed problem solving. Pt challenged to interpret prescription label directions presented verbally. Pt completed task with mod A. At conclusion of session, pt was left upright in bed with call button within reach and bed alarm active. SLP to continue POC.   Pain Pain Assessment Pain Scale: 0-10 Pain Score: 0-No pain  Therapy/Group: Individual Therapy  Joane GORMAN Fuss 09/27/2024, 9:27 AM

## 2024-09-27 NOTE — Progress Notes (Signed)
 Physical Therapy Session Note  Patient Details  Name: Kim Santiago MRN: [id number] Date of Birth: 20-Jun-1935  Today's Date: 09/27/2024 PT Individual Time: 0902-1001 PT Individual Time Calculation (min): 59 min   Short Term Goals: Week 1:  PT Short Term Goal 1 (Week 1): Pt will complete bed mobility with minA PT Short Term Goal 2 (Week 1): Pt will complete bed<>chair transfers with modA and LRAD PT Short Term Goal 3 (Week 1): Pt will ambulate 82' with modA and LRAD PT Short Term Goal 4 (Week 1): Pt will improve BERG score by at least 8 points  Skilled Therapeutic Interventions/Progress Updates:  Patient supine in bed on entrance to room. Patient alert and agreeable to PT session.   Patient with no pain complaint at start of session. Is excited/ anxious to see if she has any nausea today.   Therapeutic Activity: Bed Mobility: Pt initiates bringing BLE to EOB and then requires up to Min/ ModA to pull self into upright seated position. Does demo lean to L however mattress is not flat and encourages lean to L. Despite mattress, pt is able to produce increased lean to R side and holds to footrail of bed to maintain initially.  With multiple efforts, is able to perform forward scoot to place Bil feet to floor. VC/ tc required for technique throughout.  While seated EOB, LSO donned. Pt more comfortable without front plate in place.   Transfers: Pt performed sit<>stand and stand pivot transfers throughout session with ModA. Provided vc/ tc for sequencing forward scoot, posterior feet placement, forward hip hinge and press of forefoot into floor.    During session, continues to demo L lean, post lean and requires Min/ modA to initially attain but then Mod/ MaxA to regain/ maintain balance.   Gait Training:  Pt ambulated 20' x1/ 25' x1 using RW with   Mod/ MaxA to maintain balance, guard L knee. Demos push to L side with minimal self correction despite max vc/ tc throughout. Also demos forward  trunk flexion that she can self correct with vc. Improvement in LLE coordination with upright posture but significant reduce awareness of L deficits and continues to initiate L step advancement without balance or appropriate weight shift. Requires close w/c follow for fatigue/ balance.   Neuromuscular Re-ed: NMR facilitated during session with focus on standing balance, motor planning/ control, midline orientation. Pt guided in rise to stand from mat table into upright stance. Guided in need for BUE push from EOB, but initially is quick to attempt hand placement on RW prior to balance attainment. Requires vc for this throughout session. Improves from Mod/ Max to Min/ ModA for initial balance attainment. Unbalanced muscle activation and reduced understanding of midline prevent full balance attainment without physical assist. Pushing to L noted over time. Blocked practice with improved results.   NMR performed for improvements in motor control and coordination, balance, sequencing, judgement, and self confidence/ efficacy in performing all aspects of mobility at highest level of independence.   Patient seated upright in w/c at end of session with brakes locked, no alarm set, but sitting in day room under nursing supervision for concert and all needs within reach.   Therapy Documentation Precautions:  Precautions Precautions: Fall Recall of Precautions/Restrictions: Intact Precaution/Restrictions Comments: L hemi Required Braces or Orthoses: Spinal Brace Spinal Brace: Other (comment) (LSO) Spinal Brace Comments: for comfort for L4 fx Restrictions Weight Bearing Restrictions Per Provider Order: No  Pain: Pain Assessment Pain Scale: 0-10 Pain Score: 0-No pain  Therapy/Group: Individual Therapy  Mliss DELENA Milliner PT, DPT, CSRS 09/27/2024, 10:13 AM

## 2024-09-27 NOTE — Progress Notes (Signed)
 "                                                        PROGRESS NOTE   Subjective/Complaints: Nausea improved after Zofran   ROS: Positives per HPI above. Denies fevers, chills, vomiting , abdominal pain, SOB, chest pain, new weakness or paraesthesias.   + Diarrhea  Objective:   No results found.  Recent Labs    09/25/24 0510  WBC 8.0  HGB 8.5*  HCT 24.4*  PLT 113*    Recent Labs    09/25/24 0510  NA 130*  K 4.0  CL 99  CO2 25  GLUCOSE 90  BUN 16  CREATININE 0.63  CALCIUM  8.0*    Intake/Output Summary (Last 24 hours) at 09/27/2024 0906 Last data filed at 09/27/2024 0800 Gross per 24 hour  Intake 436 ml  Output --  Net 436 ml        Physical Exam: Vital Signs Blood pressure (!) 117/49, pulse 67, temperature 97.8 F (36.6 C), resp. rate 17, height 5' 3 (1.6 m), weight 58.5 kg, SpO2 100%.   General: No acute distress.  Laying in bed. Awake, alert, mood and affect are appropriate. Heart: Regular rate and rhythm no rubs murmurs or extra sounds Lungs: Clear to auscultation, breathing unlabored, no rales or wheezes Abdomen: Positive bowel sounds, non-tender to palpation, nondistended Extremities: No clubbing, cyanosis, or edema Skin: No evidence of breakdown, no evidence of rash  Neurologic:  Awake, alert.  Oriented to self, place, and year. Moderately dysarthric-- Cranial nerves II through XII intact, moving all 4 extremities in bed antigravity against resistance.   Fine motor finger to thumb mildly reduced on left side  Sensory exam normal sensation to light touch and proprioception in bilateral upper and lower extremities Cerebellar exam dysmetria left finger nose finger  Musculoskeletal: Full range of motion in all 4 extremities. No joint swelling   Assessment/Plan: 1. Functional deficits which require 3+ hours per day of interdisciplinary therapy in a comprehensive inpatient rehab setting. Physiatrist is providing close team supervision and 24 hour  management of active medical problems listed below. Physiatrist and rehab team continue to assess barriers to discharge/monitor patient progress toward functional and medical goals  Care Tool:  Bathing    Body parts bathed by patient: Left arm, Chest, Abdomen, Front perineal area, Face   Body parts bathed by helper: Right arm, Buttocks, Right upper leg, Left upper leg, Left lower leg, Right lower leg     Bathing assist Assist Level: Maximal Assistance - Patient 24 - 49%     Upper Body Dressing/Undressing Upper body dressing   What is the patient wearing?: Pull over shirt, Bra    Upper body assist Assist Level: Minimal Assistance - Patient > 75%    Lower Body Dressing/Undressing Lower body dressing      What is the patient wearing?: Pants, Underwear/pull up     Lower body assist Assist for lower body dressing: Moderate Assistance - Patient 50 - 74%     Toileting Toileting    Toileting assist Assist for toileting: Maximal Assistance - Patient 25 - 49%     Transfers Chair/bed transfer  Transfers assist     Chair/bed transfer assist level: Maximal Assistance - Patient 25 - 49%     Locomotion Ambulation   Ambulation assist  Assist level: Moderate Assistance - Patient 50 - 74% Assistive device: Other (comment) (R hand rail) Max distance: 30'   Walk 10 feet activity   Assist     Assist level: Moderate Assistance - Patient - 50 - 74% Assistive device: Other (comment) (R hand rail)   Walk 50 feet activity   Assist    Assist level: Maximal Assistance - Patient 25 - 49% Assistive device: Walker-Eva    Walk 150 feet activity   Assist Walk 150 feet activity did not occur: Safety/medical concerns         Walk 10 feet on uneven surface  activity   Assist Walk 10 feet on uneven surfaces activity did not occur: Safety/medical concerns         Wheelchair     Assist Is the patient using a wheelchair?: Yes Type of Wheelchair:  Manual    Wheelchair assist level: Total Assistance - Patient < 25% Max wheelchair distance: 48'    Wheelchair 50 feet with 2 turns activity    Assist        Assist Level: Total Assistance - Patient < 25%   Wheelchair 150 feet activity     Assist      Assist Level: Total Assistance - Patient < 25%   Blood pressure (!) 117/49, pulse 67, temperature 97.8 F (36.6 C), resp. rate 17, height 5' 3 (1.6 m), weight 58.5 kg, SpO2 100%.  Medical Problem List and Plan: 1. Functional deficits secondary to right thalamic intraparenchymal hemorrhage             -patient may shower             -ELOS/Goals: 12-16 days MinA             -Stable to continue CIR   2.  Antithrombotics: -DVT/anticoagulation:  Pharmaceutical: Lovenox              -antiplatelet therapy: N/A due to thalamic hemorrhage 3. Pain Management: Tylenol  prn mild pain and oxycodone  prn severe pain             --will add K pad for local measures. Voltaren gel to heel  L4 comp fx acute as well as Lumbar spinal stenosis  4. Mood/Behavior/Sleep:  LCSW to follow for evaluation and support.             --Melatonin prn for insomnia             -antipsychotic agents: N/A    5. Neuropsych/cognition: This patient is capable of making decisions on her own behalf. 6. Skin/Wound Care: Routine pressure relief measures.              --TLSO d/c due to excessive bruising with use.              --  - Bruising stable  Still sore over bruised area  7. Fluids/Electrolytes/Nutrition: Monitor I/O. Continue nutritional supplements 8. Hypotension: decrease cozaar  to 12.5mg  after supper   - 1-17: Poor p.o. intakes due to abdominal pain/excessive bowel movements today.  BP 90s over 40s this evening.  Will give some IV fluids 500 cc at 125/h and hold Cozaar  1-18: BP remains soft, responded to fluid bolus, likely losses from excessive bowel movements and poor p.o. intakes.  Start gentle IV fluids 50 cc/h today.  Vitals:   09/26/24  2030 09/27/24 0514  BP: 101/84 (!) 117/49  Pulse: 71 67  Resp: 18 17  Temp: 97.9 F (36.6 C) 97.8 F (36.6 C)  SpO2:  98% 100%  BPs ok, check orthostatic vitals  9.  Chronic hyponatremia: Continue to monitor for stability             --baseline 130 going per chart review  - 132 1-15  1-18: Sodium 126, likely hypovolemic, start gentle IV fluid normal saline 50 cc/h as above.   1/19 improved to 130 baseline is 130-135 range over the last 5+ years    Latest Ref Rng & Units 09/25/2024    5:10 AM 09/24/2024    5:12 AM 09/21/2024    4:46 AM  BMP  Glucose 70 - 99 mg/dL 90  90  87   BUN 8 - 23 mg/dL 16  30  19    Creatinine 0.44 - 1.00 mg/dL 9.36  8.96  9.24   Sodium 135 - 145 mmol/L 130  126  132   Potassium 3.5 - 5.1 mmol/L 4.0  4.3  4.3   Chloride 98 - 111 mmol/L 99  94  99   CO2 22 - 32 mmol/L 25  24  26    Calcium  8.9 - 10.3 mg/dL 8.0  7.8  8.6     10. L4 Compression Fx: Has spinal stenosis with impingement of L4 and L5 nerves per imaging --Chronic L1 compression Fx s/p kyphoplasty.  LSO was used at [facility] but not being used at the hospital per daughter --Will order for support.    11.  Chronic diastolic HF: Monitor for signs of overload. Check daily weighs.              --Used lasix  prn PTA   - Weight stable, no signs of volume overload 1-18 Filed Weights   09/25/24 0509 09/26/24 0500 09/27/24 0515  Weight: 56.5 kg 57.5 kg 58.5 kg   Some increased weight but no edema or breathing issues   12. Hx of Sjogren syndrome/cutaneous Lupus:On low dose prednisone .  --will resume Methotrexate  per recommendations--every Sunday per family --Eye drops qid with lacrilube at nights.    13. Anemia of chronic disease: Baseline Hgb around 9             --Was transfused w/one unit on 12/23 and stable. Monitor for signs of bleeding             --no signs of bleeding , Hgb down 1 gm but may be hemodilution       Latest Ref Rng & Units 09/25/2024    5:10 AM 09/21/2024    4:46 AM 09/14/2024   11:38  AM  CBC  WBC 4.0 - 10.5 K/uL 8.0  4.3  3.5   Hemoglobin 12.0 - 15.0 g/dL 8.5  9.4  9.5   Hematocrit 36.0 - 46.0 % 24.4  27.0  29.3   Platelets 150 - 400 K/uL 113  159  213     14.H/o Depression: continue Zoloft.    15. Hx of Left foot ankle pain: Calcaneal enthesopathy with tendinosis distal Achilles tendon.     16. Hypothyroid s/p thyroidectomy: continue supplement.     17 .  Constipation -   - 1-19 incont BM last pm  LOS: 7 days A FACE TO FACE EVALUATION WAS PERFORMED  Prentice FORBES Compton 09/27/2024, 9:06 AM     "

## 2024-09-28 DIAGNOSIS — I610 Nontraumatic intracerebral hemorrhage in hemisphere, subcortical: Secondary | ICD-10-CM | POA: Diagnosis not present

## 2024-09-28 DIAGNOSIS — G8194 Hemiplegia, unspecified affecting left nondominant side: Secondary | ICD-10-CM | POA: Diagnosis not present

## 2024-09-28 LAB — CBC
HCT: 24.6 % — ABNORMAL LOW (ref 36.0–46.0)
Hemoglobin: 8.5 g/dL — ABNORMAL LOW (ref 12.0–15.0)
MCH: 35 pg — ABNORMAL HIGH (ref 26.0–34.0)
MCHC: 34.6 g/dL (ref 30.0–36.0)
MCV: 101.2 fL — ABNORMAL HIGH (ref 80.0–100.0)
Platelets: 104 K/uL — ABNORMAL LOW (ref 150–400)
RBC: 2.43 MIL/uL — ABNORMAL LOW (ref 3.87–5.11)
RDW: 17.9 % — ABNORMAL HIGH (ref 11.5–15.5)
WBC: 2.6 K/uL — ABNORMAL LOW (ref 4.0–10.5)
nRBC: 0 % (ref 0.0–0.2)

## 2024-09-28 LAB — BASIC METABOLIC PANEL WITH GFR
Anion gap: 8 (ref 5–15)
BUN: 12 mg/dL (ref 8–23)
CO2: 25 mmol/L (ref 22–32)
Calcium: 8.3 mg/dL — ABNORMAL LOW (ref 8.9–10.3)
Chloride: 101 mmol/L (ref 98–111)
Creatinine, Ser: 0.63 mg/dL (ref 0.44–1.00)
GFR, Estimated: >60 mL/min
Glucose, Bld: 92 mg/dL (ref 70–99)
Potassium: 4.4 mmol/L (ref 3.5–5.1)
Sodium: 134 mmol/L — ABNORMAL LOW (ref 135–145)

## 2024-09-28 MED ORDER — MECLIZINE HCL 12.5 MG PO TABS: 12.5000 mg | ORAL_TABLET | Freq: Three times a day (TID) | ORAL | Status: AC | PRN

## 2024-09-28 MED ORDER — WITCH HAZEL-GLYCERIN EX PADS: MEDICATED_PAD | CUTANEOUS | Status: AC | PRN

## 2024-09-28 MED ORDER — OXYCODONE HCL 5 MG PO TABS: 5.0000 mg | ORAL_TABLET | Freq: Four times a day (QID) | ORAL | Status: DC | PRN

## 2024-09-28 MED ORDER — MECLIZINE HCL 12.5 MG PO TABS: 12.5000 mg | ORAL_TABLET | Freq: Three times a day (TID) | ORAL | 0 refills | Status: DC | PRN

## 2024-09-28 NOTE — Progress Notes (Signed)
 Occupational Therapy Weekly Progress Note  Patient Details  Name: Kim Santiago MRN: [id number] Date of Birth: 03-08-1935  Beginning of progress report period: September 21, 2024 End of progress report period: September 28, 2024  Today's Date: 09/28/2024 OT Individual Time: 9266-9170 OT Individual Time Calculation (min): 56 min    Patient has met 3 of 5 short term goals.  Kim Santiago is slowly progressing towards reaching her LTGs. She has been limited by nausea symptoms over the past week, which limited her to light bed level or bed level therapeutic activities. She has demonstrated decreased nausea over the past couple days which has allow for more OOB therapies. She is completing UB bathing/dressing from wc level with CGA and LB bathing/dressing MOD-MAX A. She requires MAX A for toileting and MOD-MAX A to complete toilet transfers. She is in good spirits and motivated to participate in therapies.   Patient continues to demonstrate the following deficits: muscle weakness, decreased cardiorespiratoy endurance, impaired timing and sequencing, unbalanced muscle activation, motor apraxia, ataxia, decreased coordination, and decreased motor planning, decreased midline orientation, decreased attention to left, and decreased motor planning, decreased attention, decreased awareness, decreased problem solving, decreased safety awareness, and decreased memory, central origin, and decreased sitting balance, decreased standing balance, decreased postural control, hemiplegia, and decreased balance strategies and therefore will continue to benefit from skilled OT intervention to enhance overall performance with BADL and Reduce care partner burden.  Patient progressing toward long term goals..  Continue plan of care.  OT Short Term Goals Week 1:  OT Short Term Goal 1 (Week 1): Pt will maintain static sitting balance in preparation for transfers with SUP OT Short Term Goal 1 - Progress (Week 1): Met OT Short  Term Goal 2 (Week 1): Pt will complete toilet transfers with MOD A OT Short Term Goal 2 - Progress (Week 1): Progressing toward goal OT Short Term Goal 3 (Week 1): Pt will recall hemi-dressing techniques with MIN questioning cues OT Short Term Goal 3 - Progress (Week 1): Met OT Short Term Goal 4 (Week 1): Pt will position L UE in bed or on tray table with SUP OT Short Term Goal 4 - Progress (Week 1): Met OT Short Term Goal 5 (Week 1): Pt complete LB dressing with MIN A OT Short Term Goal 5 - Progress (Week 1): Progressing toward goal Week 2:  OT Short Term Goal 1 (Week 2): Pt complete LB dressing with MIN A OT Short Term Goal 2 (Week 2): Pt will complete toilet transfers with MOD A consistently using LRAD OT Short Term Goal 3 (Week 2): Pt will tolerate standing >2 min with ADL or therapeutic activity using LRAD OT Short Term Goal 4 (Week 2): Pt will complete toileting with MOD A using LRAD  Skilled Therapeutic Interventions/Progress Updates:     Pt received sleeping in bed, waking upon OT arrival presenting to be tired, in good spirits receptive to skilled OT session reporting 0/10 pain- OT offering intermittent rest breaks, repositioning, and therapeutic support to optimize participation in therapy session. Increased time required to initiate participation in OT session and wake- mild nausea symptoms reported with RN in/out to provide medications. She transitioned to EOB with light Min A to lift trunk. Worked on lateral weight shifting to offload opposite side to scoot to EOB- increased challenge bringing L hip forwards requiring MOD A fading to MIN A as Pt learned technique. Spent increased time sitting EOB to decrease nausea symptoms while working on trunk alignment and control-  Pt with L lean 2/2 weakness, however able to correct with verbal cues with difficulty maintaining upright position. Pt's tray arriving during session with Pt requesting to eat breakfast. Engaged pt in self feeding while  sitting EOB for increased sitting balance challenge- able to intermittent maintain upright seated position with CGA, however requiring up to MOD A when becoming fatigued. MAX verbal and tactile cues required for midline awareness and trunk/pelvis positioning d/t increased posterior pelvic tilt present in seated. Able to tolerate sitting EOB during session ~30 min. Pt fatigued, requesting to return to bed at end of session. EOB > supine using reverse log rolling technique Min A +MAX verbal cues for technique. Pt was left resting in bed with call bell in reach, bed alarm on, and all needs met.    Therapy Documentation Precautions:  Precautions Precautions: Fall Recall of Precautions/Restrictions: Intact Precaution/Restrictions Comments: L hemi Required Braces or Orthoses: Spinal Brace Spinal Brace: Other (comment) (LSO) Spinal Brace Comments: for comfort for L4 fx Restrictions Weight Bearing Restrictions Per Provider Order: No   Therapy/Group: Individual Therapy  Kim Santiago 09/28/2024, 7:22 AM

## 2024-09-28 NOTE — Plan of Care (Signed)
  Problem: Consults Goal: RH STROKE PATIENT EDUCATION Description: See Patient Education module for education specifics  Outcome: Progressing   Problem: RH BOWEL ELIMINATION Goal: RH STG MANAGE BOWEL WITH ASSISTANCE Description: STG Manage Bowel with supervision Assistance. Outcome: Progressing   Problem: RH BLADDER ELIMINATION Goal: RH STG MANAGE BLADDER WITH ASSISTANCE Description: STG Manage Bladder With supervision Assistance Outcome: Progressing   Problem: RH SKIN INTEGRITY Goal: RH STG SKIN FREE OF INFECTION/BREAKDOWN Description: Manage skin free of infection with supervision assistance Outcome: Progressing   Problem: RH SAFETY Goal: RH STG ADHERE TO SAFETY PRECAUTIONS W/ASSISTANCE/DEVICE Description: STG Adhere to Safety Precautions With Assistance/Device. Outcome: Progressing

## 2024-09-28 NOTE — Group Note (Signed)
 Patient Details Name: Kim Santiago MRN: [id number] DOB: 01-Oct-1934 Today's Date: 09/28/2024  Time Calculation: OT Group Time Calculation OT Group Start Time: 1100 OT Group Stop Time: 1200 OT Group Time Calculation (min): 60 min      Group Description: Dance Group: Pt participated in dance group with an emphasis on social interaction, motor planning, increasing overall activity tolerance and bimanual tasks. All songs were selected by group members. Dance moves included AROM of BUE/BLE gross motor movements with an emphasis on building functional endurance.   Individual level documentation: Patient completed group from sitting level. Patient needed supervision to complete various dance moves with cues for safety and to initiate rest breaks.  Patient needed min modifications during group.  Pain:  0/10  Precautions:  Falls, back precautions for comfort with LSO   [name] 09/28/2024, 12:17 PM

## 2024-09-28 NOTE — Progress Notes (Signed)
 Speech Language Pathology Weekly Progress and Session Note  Patient Details  Name: Kim Santiago MRN: [id number] Date of Birth: [birth year]  Beginning of progress report period: September 21, 2024 End of progress report period: September 28, 2024  Today's Date: 09/28/2024 SLP Individual Time: 1415-1500 SLP Individual Time Calculation (min): 45 min  Short Term Goals: Week 1: SLP Short Term Goal 1 (Week 1): Patient will participate in MBS to assess oropharyngeal swallowing mechanism SLP Short Term Goal 1 - Progress (Week 1): Met SLP Short Term Goal 2 (Week 1): Patient will recall and utilize memory compensatory strategies with min multimodal A SLP Short Term Goal 2 - Progress (Week 1): Met SLP Short Term Goal 3 (Week 1): Patient will demonstrate mildly complex problem solving skills in functional tasks given min multimodal a SLP Short Term Goal 3 - Progress (Week 1): Progressing toward goal    New Short Term Goals: Week 2: SLP Short Term Goal 1 (Week 2): Patient will demonstrate mildly complex problem solving skills in functional tasks given min multimodal a SLP Short Term Goal 2 (Week 2): Patient will recall and utilize memory compensatory strategies with supervision multimodal A SLP Short Term Goal 3 (Week 2): Patient will utilize compensatory strategies during consumption of least restrictive diet given supervison multimodal A  Weekly Progress Updates: Pt has made good gains and has met 2 of 3 STGs this reporting period due to improved memory and completion of MBS to assess oropharyngeal swallow function. Currently, patient continues to require min A for memory and min-mod for problem solving.  Pt/family eduction ongoing. Pt would benefit from continued ST intervention to maximize swallowing and cognition in order to maximize functional independence at d/c.    Intensity: Minumum of 1-2 x/day, 30 to 90 minutes Frequency: 3 to 5 out of 7 days Duration/Length of Stay:  2/4 Treatment/Interventions: Cognitive remediation/compensation;Environmental controls;Therapeutic Activities;Patient/family education;Dysphagia/aspiration precaution training;Cueing hierarchy   Daily Session  Skilled Therapeutic Interventions:  Skilled therapy session focused on cognitive goals. SLP facilitated session by prompting patient to share activities completed in PT/OT this AM. Patient required minA to recall in detail activities of AM. SLP then targeted problem solving through meal budgeting task. Given $40 budget, patient created grocery list and utilized [employer] (with help from SLP) to choose appropriate items with supervisionA. Patient with episode of incontinence at the end of session and SLP/NT provided peri-care. Patient left in bed with alarm set and call bell in reach. Continue POC      Pain None reported   Therapy/Group: Individual Therapy  Ozro Russett M.A., CCC-SLP 09/28/2024, 12:25 PM

## 2024-09-28 NOTE — Progress Notes (Signed)
 "                                                        PROGRESS NOTE   Subjective/Complaints: No new issues overnight bowels are getting a little bit better.  She feels very tired from dance therapy ROS: Positives per HPI above. Denies fevers, chills, vomiting , abdominal pain, SOB, chest pain, new weakness or paraesthesias.   + Diarrhea alternating with constipation  Objective:   No results found.  Recent Labs    09/28/24 0608  WBC 2.6*  HGB 8.5*  HCT 24.6*  PLT 104*    Recent Labs    09/28/24 0608  NA 134*  K 4.4  CL 101  CO2 25  GLUCOSE 92  BUN 12  CREATININE 0.63  CALCIUM  8.3*    Intake/Output Summary (Last 24 hours) at 09/28/2024 1053 Last data filed at 09/27/2024 1800 Gross per 24 hour  Intake 218 ml  Output --  Net 218 ml        Physical Exam: Vital Signs Blood pressure 123/68, pulse 67, temperature (!) 97.4 F (36.3 C), resp. rate 17, height 5' 3 (1.6 m), weight 59.5 kg, SpO2 100%.   General: No acute distress.  Laying in bed. Awake, alert, mood and affect are appropriate. Heart: Regular rate and rhythm no rubs murmurs or extra sounds Lungs: Clear to auscultation, breathing unlabored, no rales or wheezes Abdomen: Positive bowel sounds, non-tender to palpation, nondistended Extremities: No clubbing, cyanosis, or edema Skin: No evidence of breakdown, no evidence of rash  Neurologic:  Awake, alert.  Oriented to self, place, and year. Moderately dysarthric-- Cranial nerves II through XII intact, moving all 4 extremities in bed antigravity against resistance.   Fine motor finger to thumb mildly reduced on left side  Sensory exam normal sensation to light touch and proprioception in bilateral upper and lower extremities Cerebellar exam dysmetria left finger nose finger  Musculoskeletal: Full range of motion in all 4 extremities. No joint swelling   Assessment/Plan: 1. Functional deficits which require 3+ hours per day of interdisciplinary therapy  in a comprehensive inpatient rehab setting. Physiatrist is providing close team supervision and 24 hour management of active medical problems listed below. Physiatrist and rehab team continue to assess barriers to discharge/monitor patient progress toward functional and medical goals  Care Tool:  Bathing    Body parts bathed by patient: Left arm, Chest, Abdomen, Front perineal area, Face   Body parts bathed by helper: Right arm, Buttocks, Right upper leg, Left upper leg, Left lower leg, Right lower leg     Bathing assist Assist Level: Maximal Assistance - Patient 24 - 49%     Upper Body Dressing/Undressing Upper body dressing   What is the patient wearing?: Pull over shirt, Bra    Upper body assist Assist Level: Minimal Assistance - Patient > 75%    Lower Body Dressing/Undressing Lower body dressing      What is the patient wearing?: Pants, Underwear/pull up     Lower body assist Assist for lower body dressing: Moderate Assistance - Patient 50 - 74%     Toileting Toileting    Toileting assist Assist for toileting: Maximal Assistance - Patient 25 - 49%     Transfers Chair/bed transfer  Transfers assist     Chair/bed transfer assist level:  Maximal Assistance - Patient 25 - 49%     Locomotion Ambulation   Ambulation assist      Assist level: Moderate Assistance - Patient 50 - 74% Assistive device: Other (comment) (R hand rail) Max distance: 30'   Walk 10 feet activity   Assist     Assist level: Moderate Assistance - Patient - 50 - 74% Assistive device: Other (comment) (R hand rail)   Walk 50 feet activity   Assist    Assist level: Maximal Assistance - Patient 25 - 49% Assistive device: Walker-Eva    Walk 150 feet activity   Assist Walk 150 feet activity did not occur: Safety/medical concerns         Walk 10 feet on uneven surface  activity   Assist Walk 10 feet on uneven surfaces activity did not occur: Safety/medical  concerns         Wheelchair     Assist Is the patient using a wheelchair?: Yes Type of Wheelchair: Manual    Wheelchair assist level: Total Assistance - Patient < 25% Max wheelchair distance: 22'    Wheelchair 50 feet with 2 turns activity    Assist        Assist Level: Total Assistance - Patient < 25%   Wheelchair 150 feet activity     Assist      Assist Level: Total Assistance - Patient < 25%   Blood pressure 123/68, pulse 67, temperature (!) 97.4 F (36.3 C), resp. rate 17, height 5' 3 (1.6 m), weight 59.5 kg, SpO2 100%.  Medical Problem List and Plan: 1. Functional deficits secondary to right thalamic intraparenchymal hemorrhage             -patient may shower             -ELOS/Goals: 12-16 days MinA             -Stable to continue CIR   2.  Antithrombotics: -DVT/anticoagulation:  Pharmaceutical: Lovenox              -antiplatelet therapy: N/A due to thalamic hemorrhage 3. Pain Management: Tylenol  prn mild pain and oxycodone  prn severe pain             --will add K pad for local measures. Voltaren gel to heel  L4 comp fx acute as well as Lumbar spinal stenosis  4. Mood/Behavior/Sleep:  LCSW to follow for evaluation and support.             --Melatonin prn for insomnia             -antipsychotic agents: N/A    5. Neuropsych/cognition: This patient is capable of making decisions on her own behalf. 6. Skin/Wound Care: Routine pressure relief measures.              --TLSO d/c due to excessive bruising with use.              --  - Bruising stable  Still sore over bruised area  7. Fluids/Electrolytes/Nutrition: Monitor I/O. Continue nutritional supplements 8. Hypotension: decrease cozaar  to 12.5mg  after supper   - 1-17: Poor p.o. intakes due to abdominal pain/excessive bowel movements today.  BP 90s over 40s this evening.  Will give some IV fluids 500 cc at 125/h and hold Cozaar  1-18: BP remains soft, responded to fluid bolus, likely losses from  excessive bowel movements and poor p.o. intakes.  Start gentle IV fluids 50 cc/h today.  Vitals:   09/27/24 2033 09/28/24 0619  BP: ROLLEN)  107/53 123/68  Pulse: 73 67  Resp: 17 17  Temp: 97.8 F (36.6 C) (!) 97.4 F (36.3 C)  SpO2: 99% 100%  BPs ok, Normal  orthostatic vitals   9.  Chronic hyponatremia: Continue to monitor for stability             --baseline 130 going per chart review  - 132 1-15  1-18: Sodium 126, likely hypovolemic, start gentle IV fluid normal saline 50 cc/h as above.   1/19 improved to 130 baseline is 130-135 range over the last 5+ years    Latest Ref Rng & Units 09/28/2024    6:08 AM 09/25/2024    5:10 AM 09/24/2024    5:12 AM  BMP  Glucose 70 - 99 mg/dL 92  90  90   BUN 8 - 23 mg/dL 12  16  30    Creatinine 0.44 - 1.00 mg/dL 9.36  9.36  8.96   Sodium 135 - 145 mmol/L 134  130  126   Potassium 3.5 - 5.1 mmol/L 4.4  4.0  4.3   Chloride 98 - 111 mmol/L 101  99  94   CO2 22 - 32 mmol/L 25  25  24    Calcium  8.9 - 10.3 mg/dL 8.3  8.0  7.8     10. L4 Compression Fx: Has spinal stenosis with impingement of L4 and L5 nerves per imaging --Chronic L1 compression Fx s/p kyphoplasty.  LSO was used at [facility] but not being used at the hospital per PT daughter --Will order for support.  Using LSO instead of TLSO for comfort reasons   11.  Chronic diastolic HF: Monitor for signs of overload. Check daily weighs.              --Used lasix  prn PTA   - Weight stable, no signs of volume overload 1-18 Filed Weights   09/26/24 0500 09/27/24 0515 09/28/24 0620  Weight: 57.5 kg 58.5 kg 59.5 kg   Some increased weight but no edema or breathing issues   12. Hx of Sjogren syndrome/cutaneous Lupus:On low dose prednisone .  --will resume Methotrexate  per recommendations--every Sunday per family --Eye drops qid with lacrilube at nights.    13. Pancytopenia due to  autoimmune disease : Baseline Hgb around 9             --Was transfused w/one unit on 12/23 and stable. Monitor for signs  of bleeding             --no signs of bleeding , Hgb down 1 gm but may be hemodilution       Latest Ref Rng & Units 09/28/2024    6:08 AM 09/25/2024    5:10 AM 09/21/2024    4:46 AM  CBC  WBC 4.0 - 10.5 K/uL 2.6  8.0  4.3   Hemoglobin 12.0 - 15.0 g/dL 8.5  8.5  9.4   Hematocrit 36.0 - 46.0 % 24.6  24.4  27.0   Platelets 150 - 400 K/uL 104  113  159     14.H/o Depression: continue Zoloft.    15. Hx of Left foot ankle pain: Calcaneal enthesopathy with tendinosis distal Achilles tendon.     16. Hypothyroid s/p thyroidectomy: continue supplement.     17 .  Constipation -   - 1-19 incont BM last pm  LOS: 8 days A FACE TO FACE EVALUATION WAS PERFORMED  Kim Santiago 09/28/2024, 10:53 AM     "

## 2024-09-28 NOTE — Progress Notes (Signed)
 Physical Therapy Session Note  Patient Details  Name: Kim Santiago MRN: [id number] Date of Birth: 1934-10-14  Today's Date: 09/28/2024 PT Individual Time: 9054-8965 PT Individual Time Calculation (min): 49 min   Short Term Goals: Week 1:  PT Short Term Goal 1 (Week 1): Pt will complete bed mobility with minA PT Short Term Goal 2 (Week 1): Pt will complete bed<>chair transfers with modA and LRAD PT Short Term Goal 3 (Week 1): Pt will ambulate 10' with modA and LRAD PT Short Term Goal 4 (Week 1): Pt will improve BERG score by at least 8 points  Skilled Therapeutic Interventions/Progress Updates:     Pt semi-reclined in bed napping upon arrival. Easily roused. Pt denies pain, premedicated, and agreeable to therapy. Session emphasized functional strengthening and mobility, seated balance, transfers, and core/postural strengthening. Pt sat to EOB with min A and VC for sequencing. Pt practiced seated balance EOB with CGA to min A and VC while PT donned shoes, LSO, and gait belt - posterior lean noted. Pt performed transfer EOB to WC squat pivot with max A. Pt transported dependent in [facility] to day room for time management. Pt worked on core strength, upright posture, anterior weight shifting performing forward trunk lean with 2# weighted bar guided by PT followed by pushing rolling table forward with cueing to upright gaze to limit forward head posture. Greater L trunk lean noted with fatigue. Pt briefly visited with Kim Santiago, therapy dog, for emotional support/wellbeing. Pt transported back to room dependent in WC. Pt remained seated in WC, seat belt alarm donned, and all needs in reach at end of session.  Therapy Documentation Precautions:  Precautions Precautions: Fall Recall of Precautions/Restrictions: Intact Precaution/Restrictions Comments: L hemi Required Braces or Orthoses: Spinal Brace Spinal Brace: Other (comment) (LSO) Spinal Brace Comments: for comfort for L4 fx Restrictions Weight  Bearing Restrictions Per Provider Order: No  Therapy/Group: Individual Therapy  Comer CHRISTELLA Levora Comer Levora, PT, DPT 09/28/2024, 8:59 AM

## 2024-09-29 DIAGNOSIS — G8194 Hemiplegia, unspecified affecting left nondominant side: Secondary | ICD-10-CM | POA: Diagnosis not present

## 2024-09-29 DIAGNOSIS — I610 Nontraumatic intracerebral hemorrhage in hemisphere, subcortical: Secondary | ICD-10-CM | POA: Diagnosis not present

## 2024-09-29 MED ORDER — SENNOSIDES-DOCUSATE SODIUM 8.6-50 MG PO TABS
1.0000 | ORAL_TABLET | Freq: Every day | ORAL | Status: DC
  Administered 2024-09-29 – 2024-10-07 (×9): 1 via ORAL
  Filled 2024-09-29 (×9): qty 1

## 2024-09-29 NOTE — Progress Notes (Signed)
 Speech Language Pathology Daily Session Note  Patient Details  Name: Kim Santiago MRN: [id number] Date of Birth: 31-Aug-1935  Today's Date: 09/29/2024 SLP Individual Time: 1300-1345 SLP Individual Time Calculation (min): 45 min  Short Term Goals: Week 2: SLP Short Term Goal 1 (Week 2): Patient will demonstrate mildly complex problem solving skills in functional tasks given min multimodal a SLP Short Term Goal 2 (Week 2): Patient will recall and utilize memory compensatory strategies with supervision multimodal A SLP Short Term Goal 3 (Week 2): Patient will utilize compensatory strategies during consumption of least restrictive diet given supervison multimodal A  Skilled Therapeutic Interventions: Skilled therapy session focused on cognitive goals. SLP facilitated session by prompting patient to recall activities completed in PT/OT this morning. Patient independently recalled broad activities, though required minA to recall more specific exercises completed. SLP prompted patient to complete activity initiated in yesterdays session through adding up cost of groceries.  Patient required supervisionA to add groceries to ensure budget was kept. At the end of the session, SLP provided memory book to aid patient in recall of daily information and educated patient on WRAP (write, repeat, associate, picture) memory strategies. Patient left in bed with alarm set and call bell in reach. Continue POC.  Pain None reported   Therapy/Group: Individual Therapy  Vera Furniss M.A., CCC-SLP 09/29/2024, 7:46 AM

## 2024-09-29 NOTE — Progress Notes (Signed)
 Physical Therapy Weekly Progress Note  Patient Details  Name: Kim Santiago MRN: [id number] Date of Birth: October 26, 1934  Beginning of progress report period: September 21, 2024 End of progress report period: September 29, 2024  Today's Date: 09/29/2024   Patient has met 2 of 4 short term goals.  Pt making very slow progress towards goals due to barriers from bowel impaction with associated pain and nausea that prevented complete participation in scheduled therapy sessions at the start of care. She has since improved during the first week of therapy stay but at this time she continues to require  light physical assist to complete bed mobility, ModA generally to complete functional transfers, and has recently completed very short distance ambulation using RW and mod/ MaxA d/t significant trunk flexion and L lean with L hemibody weakness. She has not yet progressed to stair training. She is limited by low activity tolerance, pain in lumbar spine, L hemibody weakness, decreased coordination. No one has yet to participate in family education to prepare for d/c home. Dtr expected to start participation in sessions this coming week.   Patient continues to demonstrate the following deficits muscle weakness, decreased cardiorespiratoy endurance, impaired timing and sequencing, unbalanced muscle activation, and decreased coordination, decreased midline orientation, decreased attention to left, and decreased motor planning, decreased awareness, decreased safety awareness, and decreased memory, and decreased standing balance, decreased postural control, hemiplegia, and decreased balance strategies and therefore will continue to benefit from skilled PT intervention to increase functional independence with mobility.  Patient progressing toward long term goals..  Continue plan of care.  PT Short Term Goals Week 1:  PT Short Term Goal 1 (Week 1): Pt will complete bed mobility with minA PT Short Term Goal 1 - Progress  (Week 1): Met PT Short Term Goal 2 (Week 1): Pt will complete bed<>chair transfers with modA and LRAD PT Short Term Goal 2 - Progress (Week 1): Met PT Short Term Goal 3 (Week 1): Pt will ambulate 88' with modA and LRAD PT Short Term Goal 3 - Progress (Week 1): Progressing toward goal PT Short Term Goal 4 (Week 1): Pt will improve BERG score by at least 8 points PT Short Term Goal 4 - Progress (Week 1): Progressing toward goal Week 2:  PT Short Term Goal 1 (Week 2): Pt will complete bed mobility with supervision and no bedrails. PT Short Term Goal 2 (Week 2): Pt will complete stand pivot transfers with minA and LRAD. PT Short Term Goal 3 (Week 2): Pt will ambulate 65' with MinA and LRAD. PT Short Term Goal 4 (Week 2): Pt will improve initial BERG score by at least 8 points.  Skilled Therapeutic Interventions/Progress Updates:  Ambulation/gait training;Balance/vestibular training;Cognitive remediation/compensation;Community reintegration;Discharge planning;Disease management/prevention;DME/adaptive equipment instruction;Functional electrical stimulation;Functional mobility training;Neuromuscular re-education;Pain management;Patient/family education;Psychosocial support;Skin care/wound management;Splinting/orthotics;Therapeutic Exercise;Stair training;Therapeutic Activities;UE/LE Strength taining/ROM;UE/LE Coordination activities;Visual/perceptual remediation/compensation;Wheelchair propulsion/positioning   Therapy Documentation Precautions:  Precautions Precautions: Fall Recall of Precautions/Restrictions: Intact Precaution/Restrictions Comments: L hemi Required Braces or Orthoses: Spinal Brace Spinal Brace: Other (comment) (LSO) Spinal Brace Comments: for comfort for L4 fx Restrictions Weight Bearing Restrictions Per Provider Order: No  Pain:  Overall pain has improved along with improvement in bowel motility and with improvement of nausea with movement. Continued intermittent low grade  pain in low back with extended time in upright stance.   Therapy/Group: Individual Therapy  Mliss DELENA Milliner PT, DPT, CSRS 09/29/2024, 12:59 PM

## 2024-09-29 NOTE — Plan of Care (Signed)
" °  Problem: Consults Goal: RH STROKE PATIENT EDUCATION Description: See Patient Education module for education specifics  Outcome: Progressing   Problem: RH BOWEL ELIMINATION Goal: RH STG MANAGE BOWEL WITH ASSISTANCE Description: STG Manage Bowel with supervision Assistance. Outcome: Progressing   Problem: RH BLADDER ELIMINATION Goal: RH STG MANAGE BLADDER WITH ASSISTANCE Description: STG Manage Bladder With supervision Assistance Outcome: Progressing   Problem: RH SKIN INTEGRITY Goal: RH STG SKIN FREE OF INFECTION/BREAKDOWN Description: Manage skin free of infection with supervision assistance Outcome: Progressing   Problem: RH SAFETY Goal: RH STG ADHERE TO SAFETY PRECAUTIONS W/ASSISTANCE/DEVICE Description: STG Adhere to Safety Precautions With Assistance/Device. Outcome: Progressing   Problem: RH KNOWLEDGE DEFICIT Goal: RH STG INCREASE KNOWLEDGE OF HYPERTENSION Description: Manage increase knowledge of hypertension with supervision assistance from daughter using educational materials provided Outcome: Progressing Goal: RH STG INCREASE KNOWLEGDE OF HYPERLIPIDEMIA Description: Manage increase knowledge of hyperlipidemia  with supervision assistance from daughter using educational materials provided Outcome: Progressing   Problem: RH PAIN MANAGEMENT Goal: RH STG PAIN MANAGED AT OR BELOW PT'S PAIN GOAL Description: <4 w/ prns Outcome: Progressing   "

## 2024-09-29 NOTE — Progress Notes (Signed)
 Occupational Therapy Session Note  Patient Details  Name: Kim Santiago MRN: [id number] Date of Birth: Mar 12, 1935  Today's Date: 09/29/2024 OT Individual Time: 9267-9155 OT Individual Time Calculation (min): 72 min    Short Term Goals: Week 2:  OT Short Term Goal 1 (Week 2): Pt complete LB dressing with MIN A OT Short Term Goal 2 (Week 2): Pt will complete toilet transfers with MOD A consistently using LRAD OT Short Term Goal 3 (Week 2): Pt will tolerate standing >2 min with ADL or therapeutic activity using LRAD OT Short Term Goal 4 (Week 2): Pt will complete toileting with MOD A using LRAD  Skilled Therapeutic Interventions/Progress Updates:     Pt received sleeping in bed waking upon OT arrival. Pt presenting to be in good spirits receptive to skilled OT session reporting 5/10 pain 2/2 to headache- OT offering intermittent rest breaks, repositioning, and therapeutic support to optimize participation in therapy session. RN in/out to provide morning medications. Focused this session on ADL retraining, dynamic standing balance, trunk control and body awareness. Supine > Eob completed MIN A to lift trunk. Improved ability to complete lateral weight shifting to shift to EOB with CGA +MOD multimodal cues. Donned [place] shirt with SUP, MIN A required for sitting balance when vision was occluded. Able to weave B LEs into pants and stand with MOD A to RW- very unsafe attempting to bring pants to waist. Educated on maintaining single UE support on RW to increase safety and balance, requiring MAX multimodal cues to implement this technique. Donned shoes set-up. Pt's breakfast tray arriving and Pt requesting to eat meal. Pt able to manipulate containers to open syrups/jellies with min spillage and use L UE at a diminished level to stabilize fork when cutting meats- increased time and effort required. Pt able to tolerate sitting EOB for entirety of eating task with CGA-MIN A required for sitting balance  +consistent verbal cues to maintain upright posture as Pt has posterior pelvic tilt, lateral lean, and kyphosis limiting sitting balance. Worked on blocked practice of sit<>stands and releasing single UE support from RW- completed 3 trials with MIN A to power up to standing and MIN-MOD A required to maintain balance when alternating reaching towards target. Increased L lean with R UE support on RW. Pt returned to bed at end of session with MIN A. Pt was left resting in bed with call bell in reach, bed alarm on, and all needs met.    Therapy Documentation Precautions:  Precautions Precautions: Fall Recall of Precautions/Restrictions: Intact Precaution/Restrictions Comments: L hemi Required Braces or Orthoses: Spinal Brace Spinal Brace: Other (comment) (LSO) Spinal Brace Comments: for comfort for L4 fx Restrictions Weight Bearing Restrictions Per Provider Order: No   Therapy/Group: Individual Therapy  Kim Santiago 09/29/2024, 7:25 AM

## 2024-09-29 NOTE — Plan of Care (Signed)
" °  Problem: Consults Goal: RH STROKE PATIENT EDUCATION Description: See Patient Education module for education specifics  Outcome: Progressing   Problem: RH BOWEL ELIMINATION Goal: RH STG MANAGE BOWEL WITH ASSISTANCE Description: STG Manage Bowel with supervision Assistance. Outcome: Progressing   Problem: RH BLADDER ELIMINATION Goal: RH STG MANAGE BLADDER WITH ASSISTANCE Description: STG Manage Bladder With supervision Assistance Outcome: Progressing   Problem: RH SKIN INTEGRITY Goal: RH STG SKIN FREE OF INFECTION/BREAKDOWN Description: Manage skin free of infection with supervision assistance Outcome: Progressing   Problem: RH SAFETY Goal: RH STG ADHERE TO SAFETY PRECAUTIONS W/ASSISTANCE/DEVICE Description: STG Adhere to Safety Precautions With Assistance/Device. Outcome: Progressing   Problem: RH PAIN MANAGEMENT Goal: RH STG PAIN MANAGED AT OR BELOW PT'S PAIN GOAL Description: <4 w/ prns Outcome: Progressing   Problem: RH KNOWLEDGE DEFICIT Goal: RH STG INCREASE KNOWLEDGE OF HYPERTENSION Description: Manage increase knowledge of hypertension with supervision assistance from daughter using educational materials provided Outcome: Progressing Goal: RH STG INCREASE KNOWLEGDE OF HYPERLIPIDEMIA Description: Manage increase knowledge of hyperlipidemia  with supervision assistance from daughter using educational materials provided Outcome: Progressing   "

## 2024-09-29 NOTE — Progress Notes (Addendum)
 "                                                        PROGRESS NOTE   Subjective/Complaints:  No issues overnite , pt feels ok , last BM 1/21 just a smear  ROS:  Denies fevers, chills, vomiting , abdominal pain, SOB, chest pain, new weakness or paraesthesias.     Objective:   No results found.  Recent Labs    09/28/24 0608  WBC 2.6*  HGB 8.5*  HCT 24.6*  PLT 104*    Recent Labs    09/28/24 0608  NA 134*  K 4.4  CL 101  CO2 25  GLUCOSE 92  BUN 12  CREATININE 0.63  CALCIUM  8.3*    Intake/Output Summary (Last 24 hours) at 09/29/2024 0901 Last data filed at 09/28/2024 1800 Gross per 24 hour  Intake 354 ml  Output --  Net 354 ml        Physical Exam: Vital Signs Blood pressure (!) 126/51, pulse 81, temperature 98.2 F (36.8 C), resp. rate 16, height 5' 3 (1.6 m), weight 59.5 kg, SpO2 100%.   General: No acute distress.  Laying in bed. Awake, alert, mood and affect are appropriate. Heart: Regular rate and rhythm no rubs murmurs or extra sounds Lungs: Clear to auscultation, breathing unlabored, no rales or wheezes Abdomen: Positive bowel sounds, non-tender to palpation, nondistended Extremities: No clubbing, cyanosis, or edema Skin: No evidence of breakdown, no evidence of rash  Neurologic:  Awake, alert.  Oriented to self, place, and year. Moderately dysarthric-- Cranial nerves II through XII intact, MMT- 5/5 RUE and RLE, 4/5 LUE and LLE  Fine motor finger to thumb mildly reduced on left side  Sensory exam normal sensation to light touch and proprioception in bilateral upper and lower extremities Cerebellar exam dysmetria left finger nose finger  Musculoskeletal: Full range of motion in all 4 extremities. No joint swelling   Assessment/Plan: 1. Functional deficits which require 3+ hours per day of interdisciplinary therapy in a comprehensive inpatient rehab setting. Physiatrist is providing close team supervision and 24 hour management of active  medical problems listed below. Physiatrist and rehab team continue to assess barriers to discharge/monitor patient progress toward functional and medical goals  Care Tool:  Bathing    Body parts bathed by patient: Left arm, Chest, Abdomen, Front perineal area, Face   Body parts bathed by helper: Right arm, Buttocks, Right upper leg, Left upper leg, Left lower leg, Right lower leg     Bathing assist Assist Level: Maximal Assistance - Patient 24 - 49%     Upper Body Dressing/Undressing Upper body dressing   What is the patient wearing?: Pull over shirt, Bra    Upper body assist Assist Level: Minimal Assistance - Patient > 75%    Lower Body Dressing/Undressing Lower body dressing      What is the patient wearing?: Pants, Underwear/pull up     Lower body assist Assist for lower body dressing: Moderate Assistance - Patient 50 - 74%     Toileting Toileting    Toileting assist Assist for toileting: Maximal Assistance - Patient 25 - 49%     Transfers Chair/bed transfer  Transfers assist     Chair/bed transfer assist level: Maximal Assistance - Patient 25 - 49%  Locomotion Ambulation   Ambulation assist      Assist level: Moderate Assistance - Patient 50 - 74% Assistive device: Other (comment) (R hand rail) Max distance: 30'   Walk 10 feet activity   Assist     Assist level: Moderate Assistance - Patient - 50 - 74% Assistive device: Other (comment) (R hand rail)   Walk 50 feet activity   Assist    Assist level: Maximal Assistance - Patient 25 - 49% Assistive device: Walker-Eva    Walk 150 feet activity   Assist Walk 150 feet activity did not occur: Safety/medical concerns         Walk 10 feet on uneven surface  activity   Assist Walk 10 feet on uneven surfaces activity did not occur: Safety/medical concerns         Wheelchair     Assist Is the patient using a wheelchair?: Yes Type of Wheelchair: Manual    Wheelchair  assist level: Total Assistance - Patient < 25% Max wheelchair distance: 53'    Wheelchair 50 feet with 2 turns activity    Assist        Assist Level: Total Assistance - Patient < 25%   Wheelchair 150 feet activity     Assist      Assist Level: Total Assistance - Patient < 25%   Blood pressure (!) 126/51, pulse 81, temperature 98.2 F (36.8 C), resp. rate 16, height 5' 3 (1.6 m), weight 59.5 kg, SpO2 100%.  Medical Problem List and Plan: 1. Functional deficits secondary to right thalamic intraparenchymal hemorrhage             -patient may shower             -ELOS/Goals: 12-16 days MinA             -Stable to continue CIR   2.  Antithrombotics: -DVT/anticoagulation:  Pharmaceutical: Lovenox              -antiplatelet therapy: N/A due to thalamic hemorrhage 3. Pain Management: Tylenol  prn mild pain and oxycodone  prn severe pain             --will add K pad for local measures. Voltaren gel to heel  L4 comp fx acute as well as Lumbar spinal stenosis  4. Mood/Behavior/Sleep:  LCSW to follow for evaluation and support.             --Melatonin prn for insomnia             -antipsychotic agents: N/A    5. Neuropsych/cognition: This patient is capable of making decisions on her own behalf. 6. Skin/Wound Care: Routine pressure relief measures.              --TLSO d/c due to excessive bruising with use.              --  - Bruising stable  Still sore over bruised area  7. Fluids/Electrolytes/Nutrition: Monitor I/O. Continue nutritional supplements 8. Hypotension: decrease cozaar  to 12.5mg  after supper   - 1-17: Poor p.o. intakes due to abdominal pain/excessive bowel movements today.  BP 90s over 40s this evening.  Will give some IV fluids 500 cc at 125/h and hold Cozaar  1-18: BP remains soft, responded to fluid bolus, likely losses from excessive bowel movements and poor p.o. intakes.  Start gentle IV fluids 50 cc/h today.  Vitals:   09/28/24 1917 09/29/24 0549  BP:  (!) 131/50 (!) 126/51  Pulse: 67 81  Resp: 16 16  Temp: 98.2 F (36.8 C) 98.2 F (36.8 C)  SpO2: 100% 100%  BPs ok, Normal  orthostatic vitals   9.  Chronic hyponatremia: Continue to monitor for stability             --baseline 130 going per chart review  - 132 1-15  1-18: Sodium 126, likely hypovolemic, start gentle IV fluid normal saline 50 cc/h as above.   1/19 improved to 130 baseline is 130-135 range over the last 5+ years    Latest Ref Rng & Units 09/28/2024    6:08 AM 09/25/2024    5:10 AM 09/24/2024    5:12 AM  BMP  Glucose 70 - 99 mg/dL 92  90  90   BUN 8 - 23 mg/dL 12  16  30    Creatinine 0.44 - 1.00 mg/dL 9.36  9.36  8.96   Sodium 135 - 145 mmol/L 134  130  126   Potassium 3.5 - 5.1 mmol/L 4.4  4.0  4.3   Chloride 98 - 111 mmol/L 101  99  94   CO2 22 - 32 mmol/L 25  25  24    Calcium  8.9 - 10.3 mg/dL 8.3  8.0  7.8     10. L4 Compression Fx: Has spinal stenosis with impingement of L4 and L5 nerves per imaging --Chronic L1 compression Fx s/p kyphoplasty.  LSO was used at [facility] but not being used at the hospital per PT daughter --Will order for support.  Using LSO instead of TLSO for comfort reasons   11.  Chronic diastolic HF: Monitor for signs of overload. Check daily weighs.              --Used lasix  prn PTA   - Weight stable, no signs of volume overload 1-18 Filed Weights   09/26/24 0500 09/27/24 0515 09/28/24 0620  Weight: 57.5 kg 58.5 kg 59.5 kg   Some increased weight but no edema or breathing issues   12. Hx of Sjogren syndrome/cutaneous Lupus:On low dose prednisone .  --will resume Methotrexate  per recommendations--every Sunday per family --Eye drops qid with lacrilube at nights.    13. Pancytopenia due to  autoimmune disease : Baseline Hgb around 9             --Was transfused w/one unit on 12/23 and stable. Monitor for signs of bleeding             --no signs of bleeding , Hgb down 1 gm but may be hemodilution       Latest Ref Rng & Units 09/28/2024     6:08 AM 09/25/2024    5:10 AM 09/21/2024    4:46 AM  CBC  WBC 4.0 - 10.5 K/uL 2.6  8.0  4.3   Hemoglobin 12.0 - 15.0 g/dL 8.5  8.5  9.4   Hematocrit 36.0 - 46.0 % 24.6  24.4  27.0   Platelets 150 - 400 K/uL 104  113  159     14.H/o Depression: continue Zoloft.    15. Hx of Left foot ankle pain: Calcaneal enthesopathy with tendinosis distal Achilles tendon.     16. Hypothyroid s/p thyroidectomy: continue supplement.     17 .  Constipation -   - 1-21 had a smear , on Miralax  every day, add senna S 1 po at bedtime   LOS: 9 days A FACE TO FACE EVALUATION WAS PERFORMED  Prentice FORBES Compton 09/29/2024, 9:01 AM     "

## 2024-09-29 NOTE — Progress Notes (Signed)
 Physical Therapy Session Note  Patient Details  Name: DEVINNE EPSTEIN MRN: [id number] Date of Birth: Nov 15, 1934  Today's Date: 09/29/2024 PT Individual Time: 1031-1102 PT Individual Time Calculation (min): 31 min   Short Term Goals: Week 1:  PT Short Term Goal 1 (Week 1): Pt will complete bed mobility with minA PT Short Term Goal 2 (Week 1): Pt will complete bed<>chair transfers with modA and LRAD PT Short Term Goal 3 (Week 1): Pt will ambulate 42' with modA and LRAD PT Short Term Goal 4 (Week 1): Pt will improve BERG score by at least 8 points   Skilled Therapeutic Interventions/Progress Updates:  Patient {positioning:34499} on entrance to room. Patient alert and agreeable to PT session.   Patient with no pain complaint at start of session.  Therapeutic Activity: Bed Mobility: Pt performed supine <> sit with {LOA:33303}. VC/ tc required for ***. Transfers: Pt performed sit<>stand and stand pivot transfers throughout session with {LOA:33303}. Provided vc/ tc for***.    Neuromuscular Re-ed: NMR facilitated during session with focus on ***. Pt guided in ***. NMR performed for improvements in motor control and coordination, balance, sequencing, judgement, and self confidence/ efficacy in performing all aspects of mobility at highest level of independence.   - pregait - mirror for midline  Patient {positioning:34499} at end of session with brakes locked, *** alarm set, and all needs within reach.   Therapy Documentation Precautions:  Precautions Precautions: Fall Recall of Precautions/Restrictions: Intact Precaution/Restrictions Comments: L hemi Required Braces or Orthoses: Spinal Brace Spinal Brace: Other (comment) (LSO) Spinal Brace Comments: for comfort for L4 fx Restrictions Weight Bearing Restrictions Per Provider Order: No General:   Vital Signs:   Pain:   Mobility:   Locomotion :    Trunk/Postural Assessment :    Balance:   Exercises:   Other  Treatments:      Therapy/Group: Individual Therapy  Mliss DELENA Milliner PT, DPT, CSRS 09/29/2024, 1:00 PM

## 2024-09-29 NOTE — Progress Notes (Signed)
 Occupational Therapy Session Note  Patient Details  Name: Kim Santiago MRN: [id number] Date of Birth: Nov 05, 1934  Today's Date: 09/29/2024 OT Individual Time: 9062-8964 OT Individual Time Calculation (min): 58 min    Short Term Goals: Week 2:  OT Short Term Goal 1 (Week 2): Pt complete LB dressing with MIN A OT Short Term Goal 2 (Week 2): Pt will complete toilet transfers with MOD A consistently using LRAD OT Short Term Goal 3 (Week 2): Pt will tolerate standing >2 min with ADL or therapeutic activity using LRAD OT Short Term Goal 4 (Week 2): Pt will complete toileting with MOD A using LRAD  Skilled Therapeutic Interventions/Progress Updates:  Pt greeted supine in bed, pt agreeable to OT intervention but requested to stay in the room.   Transfers/bed mobility/functional mobility:  Pt completed supine>sit with light CGA. Pt completed stand pivot to R side and L side  with MIN HHA on L side, pt needed max cues to stand up tall and MOD cues to sequence pivotal steps.  Practiced sit>stands at sink with pt able to stand with light MIN A needing steadying assist on L side.   Pt had STG of standing for > 2 mins, worked on this goal at the sink with pt able to stand for exactly 2 min while completing dynamic reaching tasks at sink with MINA.   Therapeutic activity:  Pt completed seated functional reaching task with LUE at sink with pt instructed to grasp soap bottles from rim of sink and cross midline to give to therapist, empahsis on improved motor planning and coordination in LUE. Pt completed task with increased time but supervision.   Pt completed standing LUE coordination task with pt instructed to stand at hi lo table to place/remove pegs from board with LUE, pt with impaired coordination noted during higher level [facility] task but pt able to complete task with MIN A for standing balance d/t R lean.   Education: offered to work on toileting transfers with pt reporting that she prefers the  bedpan, education provided on continuing to work on toilet transfers with OT staff and even discussed timed toileting d/t urgency.                  Ended session with pt supine in bed with all needs within reach and bed alarm activated.                    Therapy Documentation Precautions:  Precautions Precautions: Fall Recall of Precautions/Restrictions: Intact Precaution/Restrictions Comments: L hemi Required Braces or Orthoses: Spinal Brace Spinal Brace: Other (comment) (LSO) Spinal Brace Comments: for comfort for L4 fx Restrictions Weight Bearing Restrictions Per Provider Order: No  Pain: Unrated pain all over, rest breaks provided as needed.    Therapy/Group: Individual Therapy  Ronal Mallie Needy 09/29/2024, 12:19 PM

## 2024-09-30 DIAGNOSIS — S32040D Wedge compression fracture of fourth lumbar vertebra, subsequent encounter for fracture with routine healing: Secondary | ICD-10-CM | POA: Diagnosis not present

## 2024-09-30 DIAGNOSIS — E871 Hypo-osmolality and hyponatremia: Secondary | ICD-10-CM | POA: Diagnosis not present

## 2024-09-30 DIAGNOSIS — I959 Hypotension, unspecified: Secondary | ICD-10-CM

## 2024-09-30 DIAGNOSIS — K5900 Constipation, unspecified: Secondary | ICD-10-CM | POA: Diagnosis not present

## 2024-09-30 DIAGNOSIS — I610 Nontraumatic intracerebral hemorrhage in hemisphere, subcortical: Secondary | ICD-10-CM | POA: Diagnosis not present

## 2024-09-30 DIAGNOSIS — I5032 Chronic diastolic (congestive) heart failure: Secondary | ICD-10-CM | POA: Diagnosis not present

## 2024-09-30 MED ORDER — SORBITOL 70 % SOLN
30.0000 mL | Freq: Once | Status: AC
  Administered 2024-09-30: 30 mL via ORAL
  Filled 2024-09-30: qty 30

## 2024-09-30 NOTE — Progress Notes (Signed)
 Occupational Therapy Session Note  Patient Details  Name: Kim Santiago MRN: [id number] Date of Birth: [birth year]  Today's Date: 09/30/2024 OT Individual Time: 9199-9086 OT Individual Time Calculation (min): 73 min    Short Term Goals: Week 2:  OT Short Term Goal 1 (Week 2): Pt complete LB dressing with MIN A OT Short Term Goal 2 (Week 2): Pt will complete toilet transfers with MOD A consistently using LRAD OT Short Term Goal 3 (Week 2): Pt will tolerate standing >2 min with ADL or therapeutic activity using LRAD OT Short Term Goal 4 (Week 2): Pt will complete toileting with MOD A using LRAD  Skilled Therapeutic Interventions/Progress Updates: Patient received resting in bed. Daughter present. Patient willing to get OOB. Motivated to work with daughter focusing on training of [name] transfer. Patient utilized drop arm commode for squat pivot onto DBSC. Patient and daughter educated on steps and demo'd transfer by therapist including patient using arm rests to push up for clothing management by daughter. Patient and daughter retun demo'd the training getting on and off of BSC using a squat pivot. Patient only needing min assist for transfer and they worked well as a team during the transfer. Patient and daughter expressed desire to continue practice with self care tasks. Patient's daughter stating she can be present for family training on all days except Monday and Wednesday. Continued treatment with assisting patient to w/c with education on removing arm rest for squat pivot. Patient assisted to gym in w/c for LUE NMRE working on functional reach with hand targeting at varied heights seated reaching towards therapist hand then items along mirror. Patient with good reach coordinating wrist extension. Good gross grasp, but had difficulty with in hand manipulation. Patient has putty in room and can work on [employer] and strengthening outside of therapy. Continue with skilled OT POC for improved independence  with self care skills.       Therapy Documentation Precautions:  Precautions Precautions: Fall Recall of Precautions/Restrictions: Intact Precaution/Restrictions Comments: L hemi Required Braces or Orthoses: Spinal Brace Spinal Brace: Other (comment) (LSO) Spinal Brace Comments: for comfort for L4 fx Restrictions Weight Bearing Restrictions Per Provider Order: No General:   Vital Signs:   Pain:0/10   ADL: ADL Equipment Provided: Reacher Eating: Minimal assistance Where Assessed-Eating: Bed level Grooming: Minimal assistance, Moderate assistance Where Assessed-Grooming: Edge of bed Upper Body Bathing: Minimal assistance Where Assessed-Upper Body Bathing: Edge of bed Lower Body Bathing: Moderate assistance, Maximal assistance Where Assessed-Lower Body Bathing: Edge of bed Upper Body Dressing: Minimal assistance Where Assessed-Upper Body Dressing: Edge of bed Lower Body Dressing: Moderate assistance Where Assessed-Lower Body Dressing: Edge of bed Toileting: Maximal assistance Where Assessed-Toileting: Bedside Commode (simulated) Toilet Transfer: Maximal assistance Toilet Transfer Method: Stand pivot Tub/Shower Transfer: Not assessed Film/video Editor: Not assessed     Therapy/Group: Individual Therapy  [name] 09/30/2024, 12:24 PM

## 2024-09-30 NOTE — Progress Notes (Signed)
 "                                                        PROGRESS NOTE   Subjective/Complaints:  No acute events overnight.  Reports Tylenol  is helping keep her back and body muscle soreness under control.  She reports she has not had a bowel movement in a few days.  ROS:  Denies fevers, chills, vomiting , abdominal pain, SOB, chest pain, new weakness or paraesthesias.   +constipation   Objective:   No results found.  Recent Labs    09/28/24 0608  WBC 2.6*  HGB 8.5*  HCT 24.6*  PLT 104*    Recent Labs    09/28/24 0608  NA 134*  K 4.4  CL 101  CO2 25  GLUCOSE 92  BUN 12  CREATININE 0.63  CALCIUM  8.3*    Intake/Output Summary (Last 24 hours) at 09/30/2024 1327 Last data filed at 09/30/2024 0830 Gross per 24 hour  Intake 120 ml  Output --  Net 120 ml        Physical Exam: Vital Signs Blood pressure 93/77, pulse 73, temperature 97.8 F (36.6 C), temperature source Oral, resp. rate 18, height 5' 3 (1.6 m), weight 59.5 kg, SpO2 99%.   General: No acute distress.  (Appears comfortable Awake, alert, mood and affect are appropriate. Heart: Regular rate and rhythm no rubs murmurs or extra sounds Lungs: Clear to auscultation, breathing unlabored, no rales or wheezes Abdomen: Positive bowel sounds, non-tender to palpation, nondistended Extremities: No clubbing, cyanosis, or edema Skin: No evidence of breakdown, no evidence of rash  Neurologic:  Awake, alert.  Oriented to self, place, and year. Moderately dysarthric-- Cranial nerves II through XII intact, MMT- 5/5 RUE and RLE, 4/5 LUE and LLE  Fine motor finger to thumb mildly reduced on left side  Sensory exam normal sensation to light touch and proprioception in bilateral upper and lower extremities Cerebellar exam dysmetria left finger nose finger  Musculoskeletal: Full range of motion in all 4 extremities. No joint swelling   Prior neuro assessment is c/w today's exam 09/30/2024.   Assessment/Plan: 1.  Functional deficits which require 3+ hours per day of interdisciplinary therapy in a comprehensive inpatient rehab setting. Physiatrist is providing close team supervision and 24 hour management of active medical problems listed below. Physiatrist and rehab team continue to assess barriers to discharge/monitor patient progress toward functional and medical goals  Care Tool:  Bathing    Body parts bathed by patient: Left arm, Chest, Abdomen, Front perineal area, Face   Body parts bathed by helper: Right arm, Buttocks, Right upper leg, Left upper leg, Left lower leg, Right lower leg     Bathing assist Assist Level: Maximal Assistance - Patient 24 - 49%     Upper Body Dressing/Undressing Upper body dressing   What is the patient wearing?: Pull over shirt, Bra    Upper body assist Assist Level: Minimal Assistance - Patient > 75%    Lower Body Dressing/Undressing Lower body dressing      What is the patient wearing?: Pants, Underwear/pull up     Lower body assist Assist for lower body dressing: Moderate Assistance - Patient 50 - 74%     Toileting Toileting    Toileting assist Assist for toileting: Maximal Assistance - Patient 25 -  49%     Transfers Chair/bed transfer  Transfers assist     Chair/bed transfer assist level: Maximal Assistance - Patient 25 - 49%     Locomotion Ambulation   Ambulation assist      Assist level: Moderate Assistance - Patient 50 - 74% Assistive device: Other (comment) (R hand rail) Max distance: 30'   Walk 10 feet activity   Assist     Assist level: Moderate Assistance - Patient - 50 - 74% Assistive device: Other (comment) (R hand rail)   Walk 50 feet activity   Assist    Assist level: Maximal Assistance - Patient 25 - 49% Assistive device: Walker-Eva    Walk 150 feet activity   Assist Walk 150 feet activity did not occur: Safety/medical concerns         Walk 10 feet on uneven surface  activity   Assist  Walk 10 feet on uneven surfaces activity did not occur: Safety/medical concerns         Wheelchair     Assist Is the patient using a wheelchair?: Yes Type of Wheelchair: Manual    Wheelchair assist level: Total Assistance - Patient < 25% Max wheelchair distance: 5'    Wheelchair 50 feet with 2 turns activity    Assist        Assist Level: Total Assistance - Patient < 25%   Wheelchair 150 feet activity     Assist      Assist Level: Total Assistance - Patient < 25%   Blood pressure 93/77, pulse 73, temperature 97.8 F (36.6 C), temperature source Oral, resp. rate 18, height 5' 3 (1.6 m), weight 59.5 kg, SpO2 99%.  Medical Problem List and Plan: 1. Functional deficits secondary to right thalamic intraparenchymal hemorrhage             -patient may shower             -ELOS/Goals: 12-16 days MinA             -Stable to continue CIR   2.  Antithrombotics: -DVT/anticoagulation:  Pharmaceutical: Lovenox              -antiplatelet therapy: N/A due to thalamic hemorrhage 3. Pain Management: Tylenol  prn mild pain and oxycodone  prn severe pain             --will add K pad for local measures. Voltaren gel to heel  L4 comp fx acute as well as Lumbar spinal stenosis  4. Mood/Behavior/Sleep:  LCSW to follow for evaluation and support.             --Melatonin prn for insomnia             -antipsychotic agents: N/A    5. Neuropsych/cognition: This patient is capable of making decisions on her own behalf. 6. Skin/Wound Care: Routine pressure relief measures.              --TLSO d/c due to excessive bruising with use.              --  - Bruising stable  Still sore over bruised area  7. Fluids/Electrolytes/Nutrition: Monitor I/O. Continue nutritional supplements 8. Hypotension: decrease cozaar  to 12.5mg  after supper   - 1-17: Poor p.o. intakes due to abdominal pain/excessive bowel movements today.  BP 90s over 40s this evening.  Will give some IV fluids 500 cc at  125/h and hold Cozaar  1-18: BP remains soft, responded to fluid bolus, likely losses from excessive bowel movements and poor  p.o. intakes.  Start gentle IV fluids 50 cc/h today.  Vitals:   09/29/24 1350 09/29/24 2129  BP: (!) 118/50 93/77  Pulse: 80 73  Resp: 16 18  Temp: 97.9 F (36.6 C) 97.8 F (36.6 C)  SpO2: 96% 99%  BPs ok, Normal  orthostatic vitals   1/24 BP soft but stable continue to monitor  9.  Chronic hyponatremia: Continue to monitor for stability             --baseline 130 going per chart review  - 132 1-15  1-18: Sodium 126, likely hypovolemic, start gentle IV fluid normal saline 50 cc/h as above.   1/19 improved to 130 baseline is 130-135 range over the last 5+ years  1/24 recheck Monday    Latest Ref Rng & Units 09/28/2024    6:08 AM 09/25/2024    5:10 AM 09/24/2024    5:12 AM  BMP  Glucose 70 - 99 mg/dL 92  90  90   BUN 8 - 23 mg/dL 12  16  30    Creatinine 0.44 - 1.00 mg/dL 9.36  9.36  8.96   Sodium 135 - 145 mmol/L 134  130  126   Potassium 3.5 - 5.1 mmol/L 4.4  4.0  4.3   Chloride 98 - 111 mmol/L 101  99  94   CO2 22 - 32 mmol/L 25  25  24    Calcium  8.9 - 10.3 mg/dL 8.3  8.0  7.8     10. L4 Compression Fx: Has spinal stenosis with impingement of L4 and L5 nerves per imaging --Chronic L1 compression Fx s/p kyphoplasty.  LSO was used at [facility] but not being used at the hospital per PT daughter --Will order for support.  Using LSO instead of TLSO for comfort reasons -1/24 reports pain overall under control   11.  Chronic diastolic HF: Monitor for signs of overload. Check daily weighs.              --Used lasix  prn PTA   - Weight stable, no signs of volume overload 1-18 Filed Weights   09/26/24 0500 09/27/24 0515 09/28/24 0620  Weight: 57.5 kg 58.5 kg 59.5 kg   Some increased weight but no edema or breathing issues   12. Hx of Sjogren syndrome/cutaneous Lupus:On low dose prednisone .  --will resume Methotrexate  per recommendations--every Sunday per  family --Eye drops qid with lacrilube at nights.    13. Pancytopenia due to  autoimmune disease : Baseline Hgb around 9             --Was transfused w/one unit on 12/23 and stable. Monitor for signs of bleeding             --no signs of bleeding , Hgb down 1 gm but may be hemodilution       Latest Ref Rng & Units 09/28/2024    6:08 AM 09/25/2024    5:10 AM 09/21/2024    4:46 AM  CBC  WBC 4.0 - 10.5 K/uL 2.6  8.0  4.3   Hemoglobin 12.0 - 15.0 g/dL 8.5  8.5  9.4   Hematocrit 36.0 - 46.0 % 24.6  24.4  27.0   Platelets 150 - 400 K/uL 104  113  159     14.H/o Depression: continue Zoloft.    15. Hx of Left foot ankle pain: Calcaneal enthesopathy with tendinosis distal Achilles tendon.     16. Hypothyroid s/p thyroidectomy: continue supplement.     17 .  Constipation -   -  1-21 had a smear , on Miralax  every day, add senna S 1 po at bedtime   -1/24 sorbitol  30ml for constipation  LOS: 10 days A FACE TO FACE EVALUATION WAS PERFORMED  Murray Collier 09/30/2024, 1:27 PM     "

## 2024-09-30 NOTE — Progress Notes (Signed)
 Physical Therapy Session Note  Patient Details  Name: DEZARAY SHIBUYA MRN: [id number] Date of Birth: 11-29-1934  Today's Date: 09/30/2024 PT Individual Time: 8946-8855 PT Individual Time Calculation (min): 51 min   Short Term Goals: Week 1:  PT Short Term Goal 1 (Week 1): Pt will complete bed mobility with minA PT Short Term Goal 2 (Week 1): Pt will complete bed<>chair transfers with modA and LRAD PT Short Term Goal 3 (Week 1): Pt will ambulate 73' with modA and LRAD PT Short Term Goal 4 (Week 1): Pt will improve BERG score by at least 8 points Week 2:     Skilled Therapeutic Interventions/Progress Updates:  Patient supine in bed and asleep on entrance to room. Patient requires time to fully wake, then alert and agreeable to PT session.   Patient with no pain complaint at start of session.  Therapeutic Activity: Bed Mobility: Pt performed supine <> sit with {LOA:33303}. VC/ tc required for ***. Transfers: Pt performed sit<>stand and stand pivot transfers throughout session with {LOA:33303}. Provided vc/ tc for***.  Gait Training:  Pt ambulated *** ft using *** with {LOA:33303}. Demonstrated ***. Provided vc/ tc for ***.  Wheelchair Mobility:  Pt propelled wheelchair *** feet with ***. Provided vc/ tc for ***.  Neuromuscular Re-ed: NMR facilitated during session with focus on ***. Pt guided in ***. NMR performed for improvements in motor control and coordination, balance, sequencing, judgement, and self confidence/ efficacy in performing all aspects of mobility at highest level of independence.   Patient {positioning:34499} at end of session with brakes locked, *** alarm set, and all needs within reach.   Therapy Documentation Precautions:  Precautions Precautions: Fall Recall of Precautions/Restrictions: Intact Precaution/Restrictions Comments: L hemi Required Braces or Orthoses: Spinal Brace Spinal Brace: Other (comment) (LSO) Spinal Brace Comments: for comfort for L4  fx Restrictions Weight Bearing Restrictions Per Provider Order: No  Pain:     Therapy/Group: Individual Therapy  Mliss DELENA Milliner PT, DPT, CSRS 09/30/2024, 8:26 PM

## 2024-10-01 DIAGNOSIS — I959 Hypotension, unspecified: Secondary | ICD-10-CM | POA: Diagnosis not present

## 2024-10-01 DIAGNOSIS — E871 Hypo-osmolality and hyponatremia: Secondary | ICD-10-CM | POA: Diagnosis not present

## 2024-10-01 DIAGNOSIS — I5032 Chronic diastolic (congestive) heart failure: Secondary | ICD-10-CM

## 2024-10-01 DIAGNOSIS — I610 Nontraumatic intracerebral hemorrhage in hemisphere, subcortical: Secondary | ICD-10-CM | POA: Diagnosis not present

## 2024-10-01 DIAGNOSIS — K5900 Constipation, unspecified: Secondary | ICD-10-CM | POA: Diagnosis not present

## 2024-10-01 MED ORDER — POLYETHYLENE GLYCOL 3350 17 G PO PACK
17.0000 g | PACK | Freq: Two times a day (BID) | ORAL | Status: DC
  Administered 2024-10-01 – 2024-10-08 (×9): 17 g via ORAL
  Filled 2024-10-01 (×16): qty 1

## 2024-10-01 NOTE — Progress Notes (Signed)
 "                                                        PROGRESS NOTE   Subjective/Complaints:  No new complaints or concerns this morning.  Reports she slept well last night.  ROS:  Denies fevers, chills, vomiting , abdominal pain, SOB, chest pain, new weakness or paraesthesias.   +constipation- improved   Objective:   No results found.  No results for input(s): WBC, HGB, HCT, PLT in the last 72 hours.   No results for input(s): NA, K, CL, CO2, GLUCOSE, BUN, CREATININE, CALCIUM  in the last 72 hours.   Intake/Output Summary (Last 24 hours) at 10/01/2024 1306 Last data filed at 09/30/2024 1827 Gross per 24 hour  Intake 336 ml  Output --  Net 336 ml        Physical Exam: Vital Signs Blood pressure (!) 102/59, pulse 79, temperature 97.7 F (36.5 C), temperature source Oral, resp. rate 17, height 5' 3 (1.6 m), weight 59.5 kg, SpO2 100%.   General: No acute distress.  Sitting in wheelchair, appears comfortable Awake, alert, mood and affect are appropriate. Heart: Regular rate and rhythm no rubs murmurs or extra sounds Lungs: Clear to auscultation, breathing unlabored, no rales or wheezes Abdomen: Positive bowel sounds, non-tender to palpation, nondistended Extremities: No clubbing, cyanosis, or edema Skin: No evidence of breakdown, no evidence of rash  Neurologic:  Awake, alert.  Oriented to self, place, and year. Moderately dysarthric-- Cranial nerves II through XII intact, MMT- 5/5 RUE and RLE, 4/5 LUE and LLE  Fine motor finger to thumb mildly reduced on left side  Sensory exam normal sensation to light touch and proprioception in bilateral upper and lower extremities Cerebellar exam dysmetria left finger nose finger  Musculoskeletal: Full range of motion in all 4 extremities. No joint swelling   Prior neuro assessment is c/w today's exam 10/01/2024.   Assessment/Plan: 1. Functional deficits which require 3+ hours per day of  interdisciplinary therapy in a comprehensive inpatient rehab setting. Physiatrist is providing close team supervision and 24 hour management of active medical problems listed below. Physiatrist and rehab team continue to assess barriers to discharge/monitor patient progress toward functional and medical goals  Care Tool:  Bathing    Body parts bathed by patient: Left arm, Chest, Abdomen, Front perineal area, Face   Body parts bathed by helper: Right arm, Buttocks, Right upper leg, Left upper leg, Left lower leg, Right lower leg     Bathing assist Assist Level: Maximal Assistance - Patient 24 - 49%     Upper Body Dressing/Undressing Upper body dressing   What is the patient wearing?: Pull over shirt, Bra    Upper body assist Assist Level: Minimal Assistance - Patient > 75%    Lower Body Dressing/Undressing Lower body dressing      What is the patient wearing?: Pants, Underwear/pull up     Lower body assist Assist for lower body dressing: Moderate Assistance - Patient 50 - 74%     Toileting Toileting    Toileting assist Assist for toileting: Maximal Assistance - Patient 25 - 49%     Transfers Chair/bed transfer  Transfers assist     Chair/bed transfer assist level: Maximal Assistance - Patient 25 - 49%     Locomotion Ambulation   Ambulation  assist      Assist level: Moderate Assistance - Patient 50 - 74% Assistive device: Other (comment) (R hand rail) Max distance: 30'   Walk 10 feet activity   Assist     Assist level: Moderate Assistance - Patient - 50 - 74% Assistive device: Other (comment) (R hand rail)   Walk 50 feet activity   Assist    Assist level: Maximal Assistance - Patient 25 - 49% Assistive device: Walker-Eva    Walk 150 feet activity   Assist Walk 150 feet activity did not occur: Safety/medical concerns         Walk 10 feet on uneven surface  activity   Assist Walk 10 feet on uneven surfaces activity did not occur:  Safety/medical concerns         Wheelchair     Assist Is the patient using a wheelchair?: Yes Type of Wheelchair: Manual    Wheelchair assist level: Total Assistance - Patient < 25% Max wheelchair distance: 21'    Wheelchair 50 feet with 2 turns activity    Assist        Assist Level: Total Assistance - Patient < 25%   Wheelchair 150 feet activity     Assist      Assist Level: Total Assistance - Patient < 25%   Blood pressure (!) 102/59, pulse 79, temperature 97.7 F (36.5 C), temperature source Oral, resp. rate 17, height 5' 3 (1.6 m), weight 59.5 kg, SpO2 100%.  Medical Problem List and Plan: 1. Functional deficits secondary to right thalamic intraparenchymal hemorrhage             -patient may shower             -ELOS/Goals: 12-16 days MinA             -Stable to continue CIR   2.  Antithrombotics: -DVT/anticoagulation:  Pharmaceutical: Lovenox              -antiplatelet therapy: N/A due to thalamic hemorrhage 3. Pain Management: Tylenol  prn mild pain and oxycodone  prn severe pain             --will add K pad for local measures. Voltaren gel to heel  L4 comp fx acute as well as Lumbar spinal stenosis  4. Mood/Behavior/Sleep:  LCSW to follow for evaluation and support.             --Melatonin prn for insomnia             -antipsychotic agents: N/A    5. Neuropsych/cognition: This patient is capable of making decisions on her own behalf. 6. Skin/Wound Care: Routine pressure relief measures.              --TLSO d/c due to excessive bruising with use.              --  - Bruising stable  Still sore over bruised area  7. Fluids/Electrolytes/Nutrition: Monitor I/O. Continue nutritional supplements 8. Hypotension: decrease cozaar  to 12.5mg  after supper   - 1-17: Poor p.o. intakes due to abdominal pain/excessive bowel movements today.  BP 90s over 40s this evening.  Will give some IV fluids 500 cc at 125/h and hold Cozaar  1-18: BP remains soft,  responded to fluid bolus, likely losses from excessive bowel movements and poor p.o. intakes.  Start gentle IV fluids 50 cc/h today.  Vitals:   09/30/24 1938 10/01/24 0645  BP: (!) 113/52 (!) 102/59  Pulse: 81 79  Resp: 18 17  Temp:  97.9 F (36.6 C) 97.7 F (36.5 C)  SpO2: 99% 100%  BPs ok, Normal  orthostatic vitals   1/24-25  BP soft but stable continue to monitor  9.  Chronic hyponatremia: Continue to monitor for stability             --baseline 130 going per chart review  - 132 1-15  1-18: Sodium 126, likely hypovolemic, start gentle IV fluid normal saline 50 cc/h as above.   1/19 improved to 130 baseline is 130-135 range over the last 5+ years  1/25 recheck BMP tomorrow    Latest Ref Rng & Units 09/28/2024    6:08 AM 09/25/2024    5:10 AM 09/24/2024    5:12 AM  BMP  Glucose 70 - 99 mg/dL 92  90  90   BUN 8 - 23 mg/dL 12  16  30    Creatinine 0.44 - 1.00 mg/dL 9.36  9.36  8.96   Sodium 135 - 145 mmol/L 134  130  126   Potassium 3.5 - 5.1 mmol/L 4.4  4.0  4.3   Chloride 98 - 111 mmol/L 101  99  94   CO2 22 - 32 mmol/L 25  25  24    Calcium  8.9 - 10.3 mg/dL 8.3  8.0  7.8     10. L4 Compression Fx: Has spinal stenosis with impingement of L4 and L5 nerves per imaging --Chronic L1 compression Fx s/p kyphoplasty.  [facility]O was [facility] at [facility] but not being used at the hospital per PT daughter --Will order for support.  Using LSO instead of TLSO for comfort reasons -1/24 reports pain overall under control   11.  Chronic diastolic HF: Monitor for signs of overload. Check daily weighs.              --Used lasix  prn PTA   - Weight stable, no signs of volume overload 1-18  -1/25 no signs of volume overload noted Filed Weights   09/26/24 0500 09/27/24 0515 09/28/24 0620  Weight: 57.5 kg 58.5 kg 59.5 kg   Some increased weight but no edema or breathing issues   12. Hx of Sjogren syndrome/cutaneous Lupus:On low dose prednisone .  --will resume Methotrexate  per recommendations--every Sunday  per family --Eye drops qid with lacrilube at nights.    13. Pancytopenia due to  autoimmune disease : Baseline Hgb around 9             --Was transfused w/one unit on 12/23 and stable. Monitor for signs of bleeding             --no signs of bleeding , Hgb down 1 gm but may be hemodilution       Latest Ref Rng & Units 09/28/2024    6:08 AM 09/25/2024    5:10 AM 09/21/2024    4:46 AM  CBC  WBC 4.0 - 10.5 K/uL 2.6  8.0  4.3   Hemoglobin 12.0 - 15.0 g/dL 8.5  8.5  9.4   Hematocrit 36.0 - 46.0 % 24.6  24.4  27.0   Platelets 150 - 400 K/uL 104  113  159     14.H/o Depression: continue Zoloft.    15. Hx of Left foot ankle pain: Calcaneal enthesopathy with tendinosis distal Achilles tendon.     16. Hypothyroid s/p thyroidectomy: continue supplement.     17 .  Constipation -   - 1-21 had a smear , on Miralax  every day, add senna S 1 po at bedtime   -1/24 sorbitol  30ml  for constipation  -1/25 had a couple smaller bowel movements last night, will increase MiraLAX  to twice daily  LOS: 11 days A FACE TO FACE EVALUATION WAS PERFORMED  Kim Santiago 10/01/2024, 1:06 PM     "

## 2024-10-01 NOTE — Progress Notes (Incomplete)
 Physical Therapy Weekly Progress Note  Patient Details  Name: SHANNON BALTHAZAR MRN: [id number] Date of Birth: 06-27-1935  Beginning of progress report period: September 21, 2024 End of progress report period: September 29, 2024  Today's Date: 09/29/2024   Patient has met 2 of 4 short term goals.  Pt making very slow progress towards goals due to barriers from bowel impaction with associated pain and nausea that prevented complete participation in scheduled therapy sessions at the start of care. She e continues to require physical assist to complete bed mobility d/t pain, functional transfers with CGA/ close supervision dependent on fatigue/ weakness in BLE from pain, and completes short distance ambulation using RW and CGA. Has not yet progressed to stair training. He continues to self limit daily d/t pain and nausea 2/2 pain (despite continued education), and is limited by low activity tolerance as wellas generalized and BLE weakness. *** has participated in family education to prepare for d/c home.    Patient continues to demonstrate the following deficits {impairments:3041632} and therefore will continue to benefit from skilled PT intervention to increase functional independence with mobility.  Patient {LTG progression:3041653}.  {plan of rjmz:6958345}  PT Short Term Goals {DUH:6958314}  Skilled Therapeutic Interventions/Progress Updates:      Therapy Documentation Precautions:  Precautions Precautions: Fall Recall of Precautions/Restrictions: Intact Precaution/Restrictions Comments: L hemi Required Braces or Orthoses: Spinal Brace Spinal Brace: Other (comment) (LSO) Spinal Brace Comments: for comfort for L4 fx Restrictions Weight Bearing Restrictions Per Provider Order: No General:   Vital Signs:   Pain:   Vision/Perception     Mobility:   Locomotion :    Trunk/Postural Assessment :    Balance:   Exercises:   Other Treatments:     Therapy/Group:  {Therapy/Group:3049007}  Mliss DELENA Milliner 09/29/2024, 12:59 PM

## 2024-10-02 DIAGNOSIS — I610 Nontraumatic intracerebral hemorrhage in hemisphere, subcortical: Secondary | ICD-10-CM | POA: Diagnosis not present

## 2024-10-02 DIAGNOSIS — G8194 Hemiplegia, unspecified affecting left nondominant side: Secondary | ICD-10-CM | POA: Diagnosis not present

## 2024-10-02 LAB — CBC WITH DIFFERENTIAL/PLATELET
Basophils Absolute: 0.1 10*3/uL (ref 0.0–0.1)
Basophils Relative: 3 %
Eosinophils Absolute: 0 10*3/uL (ref 0.0–0.5)
Eosinophils Relative: 1 %
HCT: 24.4 % — ABNORMAL LOW (ref 36.0–46.0)
Hemoglobin: 8.5 g/dL — ABNORMAL LOW (ref 12.0–15.0)
Lymphocytes Relative: 33 %
Lymphs Abs: 1.2 10*3/uL (ref 0.7–4.0)
MCH: 35.3 pg — ABNORMAL HIGH (ref 26.0–34.0)
MCHC: 34.8 g/dL (ref 30.0–36.0)
MCV: 101.2 fL — ABNORMAL HIGH (ref 80.0–100.0)
Monocytes Absolute: 0.1 10*3/uL (ref 0.1–1.0)
Monocytes Relative: 2 %
Neutro Abs: 2.3 10*3/uL (ref 1.7–7.7)
Neutrophils Relative %: 61 %
Platelets: 100 10*3/uL — ABNORMAL LOW (ref 150–400)
RBC: 2.41 MIL/uL — ABNORMAL LOW (ref 3.87–5.11)
RDW: 18.7 % — ABNORMAL HIGH (ref 11.5–15.5)
WBC: 3.7 10*3/uL — ABNORMAL LOW (ref 4.0–10.5)
nRBC: 0.5 % — ABNORMAL HIGH (ref 0.0–0.2)

## 2024-10-02 LAB — BASIC METABOLIC PANEL WITH GFR
Anion gap: 8 (ref 5–15)
BUN: 14 mg/dL (ref 8–23)
CO2: 26 mmol/L (ref 22–32)
Calcium: 8.3 mg/dL — ABNORMAL LOW (ref 8.9–10.3)
Chloride: 99 mmol/L (ref 98–111)
Creatinine, Ser: 0.72 mg/dL (ref 0.44–1.00)
GFR, Estimated: >60 mL/min
Glucose, Bld: 92 mg/dL (ref 70–99)
Potassium: 4.3 mmol/L (ref 3.5–5.1)
Sodium: 133 mmol/L — ABNORMAL LOW (ref 135–145)

## 2024-10-02 NOTE — Progress Notes (Signed)
 Physical Therapy Session Note  Patient Details  Name: Kim Santiago MRN: [id number] Date of Birth: [birth year]  Today's Date: 10/02/2024 PT Individual Time: 1400-1504 PT Individual Time Calculation (min): 64 min   Short Term Goals: Week 1:  PT Short Term Goal 1 (Week 1): Pt will complete bed mobility with minA PT Short Term Goal 1 - Progress (Week 1): Met PT Short Term Goal 2 (Week 1): Pt will complete bed<>chair transfers with modA and LRAD PT Short Term Goal 2 - Progress (Week 1): Met PT Short Term Goal 3 (Week 1): Pt will ambulate 29' with modA and LRAD PT Short Term Goal 3 - Progress (Week 1): Progressing toward goal PT Short Term Goal 4 (Week 1): Pt will improve BERG score by at least 8 points PT Short Term Goal 4 - Progress (Week 1): Progressing toward goal Week 2:  PT Short Term Goal 1 (Week 2): Pt will complete bed mobility with supervision and no bedrails. PT Short Term Goal 2 (Week 2): Pt will complete stand pivot transfers with minA and LRAD. PT Short Term Goal 3 (Week 2): Pt will ambulate 15' with MinA and LRAD. PT Short Term Goal 4 (Week 2): Pt will improve initial BERG score by at least 8 points.  Skilled Therapeutic Interventions/Progress Updates:  Patient supine in bed on entrance to room. Patient alert and agreeable to PT session.   Patient with no pain complaint at start of session.  Therapeutic Activity: Bed Mobility: Pt performed supine > sit with supervision and increased effort. Is able to reach midline and cued to scoot forward. Places feet on floor with L lean. Cued to position L foot into more abducted position which pt can do with decreased coordination and SBA. Pt noted improved ability to maintain upright balance with better foot positioning.   Does not want to change clothes yet. Related dtr dried her hair over weekend for improved styling and pt happier re: hair.  While seated on EOB, educated pt on donning/ doffing LSO but continues to require ModA.  Educated on placement of string tensioners during doffing in order to prepare brace for donning later.   Transfers: Pt performed sit<>stand and stand pivot transfers throughout session with MinA. Provided vc/ tc for split hand placement and pushing self anteriorly for improved balance attainment.   Gait Training/ NMR:  Prior to gait training, reminded pt re: need for upright posture for improved NM control of BLE and balance, as well as reminded about need for slowing down for increased safety and balance.   Pt ambulated 29' x1/ 40' x1 using RW with MinA +1 and w/c follow. Provided with mirror for postural feedback during first session. Noted improvement this day in maintaining upright posture and decreased L lean. Following seated rest break, pt then completes 213' x1 using RW with MinA overall increasing with fatigue. Demonstrated continued but improved trunk flexion. Intermittent inability to advance LLE and provided with heavy MinA to maintain balance and provide vc for need of LLE step advancement. Cued for regaining standing balance and reset of posture prior to continuing ambulation. Pt is able to listen to all vc and make minor adjustments.  NMR performed for improvements in motor control and coordination, balance, sequencing, judgement, and self confidence/ efficacy in performing all aspects of mobility at highest level of independence.   Patient supine in bed at end of session with brakes locked, bed alarm set, and all needs within reach.   Therapy Documentation Precautions:  Precautions Precautions:  Fall Recall of Precautions/Restrictions: Intact Precaution/Restrictions Comments: L hemi Required Braces or Orthoses: Spinal Brace Spinal Brace: Other (comment) (LSO) Spinal Brace Comments: for comfort for L4 fx Restrictions Weight Bearing Restrictions Per Provider Order: No General:   Vital Signs:   Pain: Pain Assessment Pain Scale: 0-10 Pain Score: 2  Pain Location:  Buttocks Pain Intervention(s): Medication (See eMAR) Mobility:   Locomotion :    Trunk/Postural Assessment :    Balance:   Exercises:   Other Treatments:      Therapy/Group: Individual Therapy  Mliss DELENA Milliner PT, DPT, CSRS 10/02/2024, 5:40 PM

## 2024-10-02 NOTE — Progress Notes (Signed)
 Physical Therapy Note  Patient Details  Name: Kim Santiago MRN: [id number] Date of Birth: Jan 01, 1935 Today's Date: 10/02/2024    Due to the [place]  state of emergency, patients may not receive 3.0 hours of therapy services.    [name] 10/02/2024, 11:01 AM

## 2024-10-02 NOTE — Progress Notes (Signed)
 "                                                        PROGRESS NOTE   Subjective/Complaints:  No issues overnite , daughter at bedside  Good appetite and fluid intake Labs reviewed stable   ROS:  Denies fevers, chills, vomiting , abdominal pain, SOB, chest pain, new weakness or paraesthesias.   +constipation- improved   Objective:   No results found.  Recent Labs    10/02/24 0451  WBC 3.7*  HGB 8.5*  HCT 24.4*  PLT 100*     Recent Labs    10/02/24 0451  NA 133*  K 4.3  CL 99  CO2 26  GLUCOSE 92  BUN 14  CREATININE 0.72  CALCIUM  8.3*     Intake/Output Summary (Last 24 hours) at 10/02/2024 0859 Last data filed at 10/01/2024 2300 Gross per 24 hour  Intake 240 ml  Output --  Net 240 ml        Physical Exam: Vital Signs Blood pressure 120/61, pulse 77, temperature 97.8 F (36.6 C), temperature source Oral, resp. rate 18, height 5' 3 (1.6 m), weight 57.9 kg, SpO2 99%.   General: No acute distress.  Sitting in wheelchair, appears comfortable Awake, alert, mood and affect are appropriate. Heart: Regular rate and rhythm no rubs murmurs or extra sounds Lungs: Clear to auscultation, breathing unlabored, no rales or wheezes Abdomen: Positive bowel sounds, non-tender to palpation, nondistended Extremities: No clubbing, cyanosis, or edema Skin: No evidence of breakdown, no evidence of rash  Neurologic:  Awake, alert.  Oriented to self, place, and year. Moderately dysarthric-- Cranial nerves II through XII intact, MMT- 5/5 RUE and RLE, 4/5 LUE and LLE  Fine motor finger to thumb mildly reduced on left side  Sensory exam normal sensation to light touch and proprioception in bilateral upper and lower extremities Cerebellar exam dysmetria left finger nose finger  Musculoskeletal: Full range of motion in all 4 extremities. No joint swelling   Prior neuro assessment is c/w today's exam 10/02/2024.   Assessment/Plan: 1. Functional deficits which require 3+  hours per day of interdisciplinary therapy in a comprehensive inpatient rehab setting. Physiatrist is providing close team supervision and 24 hour management of active medical problems listed below. Physiatrist and rehab team continue to assess barriers to discharge/monitor patient progress toward functional and medical goals  Care Tool:  Bathing    Body parts bathed by patient: Left arm, Chest, Abdomen, Front perineal area, Face   Body parts bathed by helper: Right arm, Buttocks, Right upper leg, Left upper leg, Left lower leg, Right lower leg     Bathing assist Assist Level: Maximal Assistance - Patient 24 - 49%     Upper Body Dressing/Undressing Upper body dressing   What is the patient wearing?: Pull over shirt, Bra    Upper body assist Assist Level: Minimal Assistance - Patient > 75%    Lower Body Dressing/Undressing Lower body dressing      What is the patient wearing?: Pants, Underwear/pull up     Lower body assist Assist for lower body dressing: Moderate Assistance - Patient 50 - 74%     Toileting Toileting    Toileting assist Assist for toileting: Maximal Assistance - Patient 25 - 49%     Transfers Chair/bed transfer  Transfers assist     Chair/bed transfer assist level: Maximal Assistance - Patient 25 - 49%     Locomotion Ambulation   Ambulation assist      Assist level: Moderate Assistance - Patient 50 - 74% Assistive device: Other (comment) (R hand rail) Max distance: 30'   Walk 10 feet activity   Assist     Assist level: Moderate Assistance - Patient - 50 - 74% Assistive device: Other (comment) (R hand rail)   Walk 50 feet activity   Assist    Assist level: Maximal Assistance - Patient 25 - 49% Assistive device: Walker-Eva    Walk 150 feet activity   Assist Walk 150 feet activity did not occur: Safety/medical concerns         Walk 10 feet on uneven surface  activity   Assist Walk 10 feet on uneven surfaces activity  did not occur: Safety/medical concerns         Wheelchair     Assist Is the patient using a wheelchair?: Yes Type of Wheelchair: Manual    Wheelchair assist level: Total Assistance - Patient < 25% Max wheelchair distance: 53'    Wheelchair 50 feet with 2 turns activity    Assist        Assist Level: Total Assistance - Patient < 25%   Wheelchair 150 feet activity     Assist      Assist Level: Total Assistance - Patient < 25%   Blood pressure 120/61, pulse 77, temperature 97.8 F (36.6 C), temperature source Oral, resp. rate 18, height 5' 3 (1.6 m), weight 57.9 kg, SpO2 99%.  Medical Problem List and Plan: 1. Functional deficits secondary to right thalamic intraparenchymal hemorrhage             -patient may shower             -ELOS/Goals: 2/4 MinA             -Stable to continue CIR   2.  Antithrombotics: -DVT/anticoagulation:  Pharmaceutical: Lovenox              -antiplatelet therapy: N/A due to thalamic hemorrhage 3. Pain Management: Tylenol  prn mild pain and oxycodone  prn severe pain             --will add K pad for local measures. Voltaren gel to heel  L4 comp fx acute as well as Lumbar spinal stenosis  4. Mood/Behavior/Sleep:  LCSW to follow for evaluation and support.             --Melatonin prn for insomnia             -antipsychotic agents: N/A    5. Neuropsych/cognition: This patient is capable of making decisions on her own behalf. 6. Skin/Wound Care: Routine pressure relief measures.              --TLSO d/c due to excessive bruising with use.              --  - Bruising stable  Still sore over bruised area  7. Fluids/Electrolytes/Nutrition: Monitor I/O. Continue nutritional supplements 8. Hypotension: decrease cozaar  to 12.5mg  after supper   - 1-17: Poor p.o. intakes due to abdominal pain/excessive bowel movements today.  BP 90s over 40s this evening.  Will give some IV fluids 500 cc at 125/h and hold Cozaar  1-18: BP remains soft,  responded to fluid bolus, likely losses from excessive bowel movements and poor p.o. intakes.  Start gentle IV fluids 50 cc/h today.  Vitals:   10/01/24 2015 10/02/24 0454  BP: (!) 103/47 120/61  Pulse: 76 77  Resp: 18 18  Temp: 98 F (36.7 C) 97.8 F (36.6 C)  SpO2: 98% 99%  BPs ok, Normal  orthostatic vitals   1/24-25  BP soft but stable continue to monitor  9.  Chronic hyponatremia: Continue to monitor for stability             --baseline 130 going per chart review  - 132 1-15  1-18: Sodium 126, likely hypovolemic, start gentle IV fluid normal saline 50 cc/h as above.   1/19 improved to 130 baseline is 130-135 range over the last 5+ years  1/25 recheck BMP tomorrow    Latest Ref Rng & Units 10/02/2024    4:51 AM 09/28/2024    6:08 AM 09/25/2024    5:10 AM  BMP  Glucose 70 - 99 mg/dL 92  92  90   BUN 8 - 23 mg/dL 14  12  16    Creatinine 0.44 - 1.00 mg/dL 9.27  9.36  9.36   Sodium 135 - 145 mmol/L 133  134  130   Potassium 3.5 - 5.1 mmol/L 4.3  4.4  4.0   Chloride 98 - 111 mmol/L 99  101  99   CO2 22 - 32 mmol/L 26  25  25    Calcium  8.9 - 10.3 mg/dL 8.3  8.3  8.0     10. L4 Compression Fx: Has spinal stenosis with impingement of L4 and L5 nerves per imaging --Chronic L1 compression Fx s/p kyphoplasty.  LSO was used at [facility] but not being used at the hospital per PT daughter --Will order for support.  Using LSO instead of TLSO for comfort reasons -1/24 reports pain overall under control   11.  Chronic diastolic HF: Monitor for signs of overload. Check daily weighs.              --Used lasix  prn PTA   - Weight stable, no signs of volume overload 1-18  -1/25 no signs of volume overload noted Filed Weights   09/27/24 0515 09/28/24 0620 10/02/24 0454  Weight: 58.5 kg 59.5 kg 57.9 kg   Some increased weight but no edema or breathing issues   12. Hx of Sjogren syndrome/cutaneous Lupus:On low dose prednisone .  --will resume Methotrexate  per recommendations--every Sunday per  family --Eye drops qid with lacrilube at nights.    13. Pancytopenia due to  autoimmune disease : Baseline Hgb around 9             --Was transfused w/one unit on 12/23 and stable. Monitor for signs of bleeding             --no signs of bleeding , Hgb down 1 gm but may be hemodilution       Latest Ref Rng & Units 10/02/2024    4:51 AM 09/28/2024    6:08 AM 09/25/2024    5:10 AM  CBC  WBC 4.0 - 10.5 K/uL 3.7  2.6  8.0   Hemoglobin 12.0 - 15.0 g/dL 8.5  8.5  8.5   Hematocrit 36.0 - 46.0 % 24.4  24.6  24.4   Platelets 150 - 400 K/uL 100  104  113     14.H/o Depression: continue Zoloft.    15. Hx of Left foot ankle pain: Calcaneal enthesopathy with tendinosis distal Achilles tendon.     16. Hypothyroid s/p thyroidectomy: continue supplement.     17 .  Constipation -   -  1-21 had a smear , on Miralax  every day, add senna S 1 po at bedtime   -1/24 sorbitol  30ml for constipation  -1/25 had a couple smaller bowel movements last night, will increase MiraLAX  to twice daily  LOS: 12 days A FACE TO FACE EVALUATION WAS PERFORMED  Prentice FORBES Compton 10/02/2024, 8:59 AM     "

## 2024-10-02 NOTE — Progress Notes (Signed)
 Occupational Therapy Session Note  Patient Details  Name: Kim Santiago MRN: [id number] Date of Birth: 25-Sep-1934  Today's Date: 10/02/2024 OT Individual Time: 9196-9088 OT Individual Time Calculation (min): 68 min    Short Term Goals: Week 2:  OT Short Term Goal 1 (Week 2): Pt complete LB dressing with MIN A OT Short Term Goal 2 (Week 2): Pt will complete toilet transfers with MOD A consistently using LRAD OT Short Term Goal 3 (Week 2): Pt will tolerate standing >2 min with ADL or therapeutic activity using LRAD OT Short Term Goal 4 (Week 2): Pt will complete toileting with MOD A using LRAD  Skilled Therapeutic Interventions/Progress Updates:     Pt received sitting up in bed finishing breakfast with DTR present in room. Pt presenting to be in good spirits receptive to skilled OT session reporting 0/10 pain- OT offering intermittent rest breaks, repositioning, and therapeutic support to optimize participation in therapy session. Focused this session on ADL retraining, functional transfer training, and family education. Pt's DTR participating throughout session, receptive to all education provided. Educated on CVA etiology/recovery process and Pt's current functional deficits 2/2 CVA. Recommended use of gait belt at all times OOB and 24/7 SUP. Educated on hemi-dressing techniques with Pt completing sitting EOB demo'ing teach back as evidence of learning. Able to doff/don [place] shirt with CGA-MN A for sitting balance- would be able to complete set-up from chair if provided posterior trunk support. Able to weave B LEs into pants with MIN A with assistance required for dynamic sitting balance and bring pants to waist with MIN A for balance +MAX verbal cues for safety and technique. Stand pivot EOB > wc using RW to R MIN A +MOD verbal cues for technique, safety and trunk positioning. Pt presenting with L lean, L push in standing, decreased L LE proprioception, and decreased safety awareness. Completed  oral care in seated position with SUP. Engaged pt in completing w/c propulsion to therapy gym to work on B UE coordination and endurance- able to complete ~159ft MIN A +MOD-MAX verbal cues for technique and L hand positioning. Engaged Pt in completed blocked practice of stand pivot and short distance ambulatory transfers using RW to improve carryover of technique. Pt able to complete transfers overall with MIN A to light MOD A required when turning to the L d/t increased challenges stepping L LE and weight shifting to R- improvement using external visual cue with Pt instructed to step L foot towards front wheel of RW. MAX verbal cues required for sequencing, weight shifting, technique, trunk position, and proximity to RW. Engaged Pt's DTR in hands-on training during last two trials with OT providing feedback on simple cues to provide, gait belt use, and body mechanics to increase safety. Transported Pt back to room in wc. Pt requested to return to bed. Stand pivot wc > EOB using RW MIN A +MOD verbal cues. EOB > supine CGA. Pt was left resting in bed with call bell in reach, bed alarm on, and all needs met.    Therapy Documentation Precautions:  Precautions Precautions: Fall Recall of Precautions/Restrictions: Intact Precaution/Restrictions Comments: L hemi Required Braces or Orthoses: Spinal Brace Spinal Brace: Other (comment) (LSO) Spinal Brace Comments: for comfort for L4 fx Restrictions Weight Bearing Restrictions Per Provider Order: No   Therapy/Group: Individual Therapy  [name] 10/02/2024, 7:57 AM

## 2024-10-02 NOTE — Progress Notes (Signed)
 Speech Language Pathology Daily Session Note  Patient Details  Name: Kim Santiago MRN: [id number] Date of Birth: 1934-10-18  Today's Date: 10/02/2024 SLP Individual Time: 1000-1100 SLP Individual Time Calculation (min): 60 min  Short Term Goals: Week 2: SLP Short Term Goal 1 (Week 2): Patient will demonstrate mildly complex problem solving skills in functional tasks given min multimodal a SLP Short Term Goal 2 (Week 2): Patient will recall and utilize memory compensatory strategies with supervision multimodal A SLP Short Term Goal 3 (Week 2): Patient will utilize compensatory strategies during consumption of least restrictive diet given supervison multimodal A  Skilled Therapeutic Interventions: Skilled therapy session focused on cognitive goals. SLP facilitated session by prompting patient to navigate to various locations of the hospital general dynamics, gift ship, main entrance). Patient utilized signs to locate each area with supervisionA. Patient then recalled 3/3 locations and activities completed in each location after a 20 minute delay independently. SLP then prompted patient to complete memory book from today sessions. Patient left in bed with alarm set and call bell in reach. Continue POC  Pain None reported  Therapy/Group: Individual Therapy  Dredyn Gubbels M.A., CCC-SLP 10/02/2024, 8:43 AM

## 2024-10-03 DIAGNOSIS — G8194 Hemiplegia, unspecified affecting left nondominant side: Secondary | ICD-10-CM | POA: Diagnosis not present

## 2024-10-03 DIAGNOSIS — I610 Nontraumatic intracerebral hemorrhage in hemisphere, subcortical: Secondary | ICD-10-CM | POA: Diagnosis not present

## 2024-10-03 MED ORDER — ZINC OXIDE 40 % EX OINT
TOPICAL_OINTMENT | Freq: Three times a day (TID) | CUTANEOUS | Status: DC
  Filled 2024-10-03 (×3): qty 57

## 2024-10-03 NOTE — Progress Notes (Signed)
 Occupational Therapy Session Note  Patient Details  Name: Kim Santiago MRN: [id number] Date of Birth: [birth year]  Today's Date: 10/03/2024 OT Individual Time: 9148-9050 OT Individual Time Calculation (min): 58 min    Short Term Goals: Week 2:  OT Short Term Goal 1 (Week 2): Pt complete LB dressing with MIN A OT Short Term Goal 2 (Week 2): Pt will complete toilet transfers with MOD A consistently using LRAD OT Short Term Goal 3 (Week 2): Pt will tolerate standing >2 min with ADL or therapeutic activity using LRAD OT Short Term Goal 4 (Week 2): Pt will complete toileting with MOD A using LRAD  Skilled Therapeutic Interventions/Progress Updates:     Pt received resting in bed presenting to be in good spirits receptive to skilled OT session reporting 0/10 pain- OT offering intermittent rest breaks, repositioning, and therapeutic support to optimize participation in therapy session. Focused this session on ADL retraining and functional transfer training.   Donned pants supine in bed with Pt able to weave B LEs with MIN A for positioning of pants and complete bridges x2 trials to bring pants to waist while OT stabilized LE. Supine > EOB MOD A to lift trunk. Pt able to donn LSO with MIN A to ensure tight fit.   Pt reporting to need to use restroom- receptive to using toilet vs bed pan with mod encouragement. Engaged Pt in completing functional mobility to bathroom using RW for endurance and transfer training- able to complete ~43ft with MIN A for balance and RW management, MAX multimodal cues required for L foot positioning, sequencing, proximity to RW, and trunk alignment.   Increased time required on toilet d/t need for BM with Pt having BMx2 and continent void. CGA-MIN A required for sitting balance with B UE support on grab bars. Pt able to assist with completing posterior peri-care, completing lateral and anterior leans MIN-MOD A for sitting balance. Pt reporting increased pain in buttocks  when completing peri-care requiring MOD A from OT to ensure cleanliness. Brief donned total A and pants brought to waist MAX A to allow Pt to focus on standing balance.   When ambulating back to bed, Pt with increased fatigue requiring MOD A for balance and RW management as Pt demonstrated difficulty motor planning stepping sequence when fatigued and was hyper focused on quickly returning to bed. Pt very unsafe during this ambulatory transfer- once returned to bed, dicussed importance of taking time for standing rest break when fatigued and slowing down during transfers to ensure safety with Pt verbalizing understanding.   EOB > supine MIN A. MD in/out for morning rounding. Informed MD of Pt's pain in buttocks. At end of session, spent time positioning pillows on either side of Pt to decrease pressure on middle of her buttocks with increased comfort reported following. Pt was left resting in bed with call bell in reach, bed alarm on, and all needs met.    Therapy Documentation Precautions:  Precautions Precautions: Fall Recall of Precautions/Restrictions: Intact Precaution/Restrictions Comments: L hemi Required Braces or Orthoses: Spinal Brace Spinal Brace: Other (comment) (LSO) Spinal Brace Comments: for comfort for L4 fx Restrictions Weight Bearing Restrictions Per Provider Order: No   Therapy/Group: Individual Therapy  Kim Santiago 10/03/2024, 7:25 AM

## 2024-10-03 NOTE — Progress Notes (Signed)
 "                                                        PROGRESS NOTE   Subjective/Complaints:  No issues overnite , working with OT, sitting balance is fair Perineal area irritated by urine and feces  ROS:  Denies fevers, chills, vomiting , abdominal pain, SOB, chest pain, new weakness or paraesthesias.   +constipation- improved   Objective:   No results found.  Recent Labs    10/02/24 0451  WBC 3.7*  HGB 8.5*  HCT 24.4*  PLT 100*     Recent Labs    10/02/24 0451  NA 133*  K 4.3  CL 99  CO2 26  GLUCOSE 92  BUN 14  CREATININE 0.72  CALCIUM  8.3*     Intake/Output Summary (Last 24 hours) at 10/03/2024 1020 Last data filed at 10/03/2024 0900 Gross per 24 hour  Intake 820 ml  Output --  Net 820 ml        Physical Exam: Vital Signs Blood pressure (!) 104/53, pulse 76, temperature 97.7 F (36.5 C), temperature source Oral, resp. rate 19, height 5' 3 (1.6 m), weight 57.9 kg, SpO2 99%.   General: No acute distress.  Sitting in wheelchair, appears comfortable Awake, alert, mood and affect are appropriate. Heart: Regular rate and rhythm no rubs murmurs or extra sounds Lungs: Clear to auscultation, breathing unlabored, no rales or wheezes Abdomen: Positive bowel sounds, non-tender to palpation, nondistended Extremities: No clubbing, cyanosis, or edema Skin: No evidence of breakdown, no evidence of rash  Neurologic:  Awake, alert.  Oriented to self, place, and year. Moderately dysarthric-- Cranial nerves II through XII intact, MMT- 5/5 RUE and RLE, 4/5 LUE and LLE  Fine motor finger to thumb mildly reduced on left side  Sensory exam normal sensation to light touch and proprioception in bilateral upper and lower extremities Cerebellar exam dysmetria left finger nose finger  Musculoskeletal: Full range of motion in all 4 extremities. No joint swelling   Prior neuro assessment is c/w today's exam 10/03/2024.   Assessment/Plan: 1. Functional deficits which  require 3+ hours per day of interdisciplinary therapy in a comprehensive inpatient rehab setting. Physiatrist is providing close team supervision and 24 hour management of active medical problems listed below. Physiatrist and rehab team continue to assess barriers to discharge/monitor Kim Santiago progress toward functional and medical goals  Care Tool:  Bathing    Body parts bathed by Kim Santiago: Left arm, Chest, Abdomen, Front perineal area, Face   Body parts bathed by helper: Right arm, Buttocks, Right upper leg, Left upper leg, Left lower leg, Right lower leg     Bathing assist Assist Level: Maximal Assistance - Kim Santiago 24 - 49%     Upper Body Dressing/Undressing Upper body dressing   What is the Kim Santiago wearing?: Pull over shirt, Bra    Upper body assist Assist Level: Minimal Assistance - Kim Santiago > 75%    Lower Body Dressing/Undressing Lower body dressing      What is the Kim Santiago wearing?: Pants, Underwear/pull up     Lower body assist Assist for lower body dressing: Moderate Assistance - Kim Santiago 50 - 74%     Toileting Toileting    Toileting assist Assist for toileting: Maximal Assistance - Kim Santiago 25 - 49%     Transfers Chair/bed  transfer  Transfers assist     Chair/bed transfer assist level: Maximal Assistance - Kim Santiago 25 - 49%     Locomotion Ambulation   Ambulation assist      Assist level: Moderate Assistance - Kim Santiago 50 - 74% Assistive device: Other (comment) (R hand rail) Max distance: 30'   Walk 10 feet activity   Assist     Assist level: Moderate Assistance - Kim Santiago - 50 - 74% Assistive device: Other (comment) (R hand rail)   Walk 50 feet activity   Assist    Assist level: Maximal Assistance - Kim Santiago 25 - 49% Assistive device: Walker-Eva    Walk 150 feet activity   Assist Walk 150 feet activity did not occur: Safety/medical concerns         Walk 10 feet on uneven surface  activity   Assist Walk 10 feet on uneven  surfaces activity did not occur: Safety/medical concerns         Wheelchair     Assist Is the Kim Santiago using a wheelchair?: Yes Type of Wheelchair: Manual    Wheelchair assist level: Total Assistance - Kim Santiago < 25% Max wheelchair distance: 55'    Wheelchair 50 feet with 2 turns activity    Assist        Assist Level: Total Assistance - Kim Santiago < 25%   Wheelchair 150 feet activity     Assist      Assist Level: Total Assistance - Kim Santiago < 25%   Blood pressure (!) 104/53, pulse 76, temperature 97.7 F (36.5 C), temperature source Oral, resp. rate 19, height 5' 3 (1.6 m), weight 57.9 kg, SpO2 99%.  Medical Problem List and Plan: 1. Functional deficits secondary to right thalamic intraparenchymal hemorrhage             -Kim Santiago may shower             -ELOS/Goals: 2/4 MinA             -Stable to continue CIR   2.  Antithrombotics: -DVT/anticoagulation:  Pharmaceutical: Lovenox              -antiplatelet therapy: N/A due to thalamic hemorrhage 3. Pain Management: Tylenol  prn mild pain and oxycodone  prn severe pain             --will add K pad for local measures. Voltaren gel to heel  L4 comp fx acute as well as Lumbar spinal stenosis  4. Mood/Behavior/Sleep:  LCSW to follow for evaluation and support.             --Melatonin prn for insomnia             -antipsychotic agents: N/A    5. Neuropsych/cognition: This Kim Santiago is capable of making decisions on her own behalf. 6. Skin/Wound Care: Routine pressure relief measures.              --TLSO d/c due to excessive bruising with use.              --  - Bruising stable  Still sore over bruised area but improved Trial desitin over peri area  7. Fluids/Electrolytes/Nutrition: Monitor I/O. Continue nutritional supplements 8. Hypotension: decrease cozaar  to 12.5mg  after supper   - 1-17: Poor p.o. intakes due to abdominal pain/excessive bowel movements today.  BP 90s over 40s this evening.  Will give some IV  fluids 500 cc at 125/h and hold Cozaar  1-18: BP remains soft, responded to fluid bolus, likely losses from excessive bowel movements and poor  p.o. intakes.  Start gentle IV fluids 50 cc/h today.  Vitals:   10/02/24 1919 10/03/24 0522  BP: (!) 111/53 (!) 104/53  Pulse: 72 76  Resp: 18 19  Temp: 97.8 F (36.6 C) 97.7 F (36.5 C)  SpO2: 100% 99%  BPs ok, Normal  orthostatic vitals   1/24-25  BP soft but stable continue to monitor  9.  Chronic hyponatremia: Continue to monitor for stability             --baseline 130 going per chart review  - 132 1-15  1-18: Sodium 126, likely hypovolemic, start gentle IV fluid normal saline 50 cc/h as above.   1/19 improved to 130 baseline is 130-135 range over the last 5+ years  1/25 recheck BMP tomorrow    Latest Ref Rng & Units 10/02/2024    4:51 AM 09/28/2024    6:08 AM 09/25/2024    5:10 AM  BMP  Glucose 70 - 99 mg/dL 92  92  90   BUN 8 - 23 mg/dL 14  12  16    Creatinine 0.44 - 1.00 mg/dL 9.27  9.36  9.36   Sodium 135 - 145 mmol/L 133  134  130   Potassium 3.5 - 5.1 mmol/L 4.3  4.4  4.0   Chloride 98 - 111 mmol/L 99  101  99   CO2 22 - 32 mmol/L 26  25  25    Calcium  8.9 - 10.3 mg/dL 8.3  8.3  8.0     10. L4 Compression Fx: Has spinal stenosis with impingement of L4 and L5 nerves per imaging --Chronic L1 compression Fx s/p kyphoplasty.  LSO was used at [facility] but not being used at the hospital per PT daughter --Will order for support.  Using LSO instead of TLSO for comfort reasons -1/24 reports pain overall under control   11.  Chronic diastolic HF: Monitor for signs of overload. Check daily weighs.              --Used lasix  prn PTA   - Weight stable, no signs of volume overload 1-18  -1/25 no signs of volume overload noted Filed Weights   09/27/24 0515 09/28/24 0620 10/02/24 0454  Weight: 58.5 kg 59.5 kg 57.9 kg   Some increased weight but no edema or breathing issues   12. Hx of Sjogren syndrome/cutaneous Lupus:On low dose prednisone .   --will resume Methotrexate  per recommendations--every Sunday per family --Eye drops qid with lacrilube at nights.    13. Pancytopenia due to  autoimmune disease : Baseline Hgb around 9             --Was transfused w/one unit on 12/23 and stable. Monitor for signs of bleeding             --no signs of bleeding , Hgb down 1 gm but may be hemodilution       Latest Ref Rng & Units 10/02/2024    4:51 AM 09/28/2024    6:08 AM 09/25/2024    5:10 AM  CBC  WBC 4.0 - 10.5 K/uL 3.7  2.6  8.0   Hemoglobin 12.0 - 15.0 g/dL 8.5  8.5  8.5   Hematocrit 36.0 - 46.0 % 24.4  24.6  24.4   Platelets 150 - 400 K/uL 100  104  113     14.H/o Depression: continue Zoloft.    15. Hx of Left foot ankle pain: Calcaneal enthesopathy with tendinosis distal Achilles tendon.     16 . Hypothyroid s/p thyroidectomy:  continue supplement.     17.  Constipation -   - 1-21 had a smear , on Miralax  every day, add senna S 1 po at bedtime   -1/24 sorbitol  30ml for constipation  -1/25 had a couple smaller bowel movements last night, will increase MiraLAX  to twice daily  LOS: 13 days A FACE TO FACE EVALUATION WAS PERFORMED  Kim Santiago 10/03/2024, 10:20 AM     "

## 2024-10-03 NOTE — Progress Notes (Signed)
 Physical Therapy Session Note  Patient Details  Name: Kim Santiago MRN: [id number] Date of Birth: May 30, 1935  Today's Date: 10/03/2024 PT Individual Time: 8583-8497 PT Individual Time Calculation (min): 46 min   Short Term Goals: Week 1:  PT Short Term Goal 1 (Week 1): Pt will complete bed mobility with minA PT Short Term Goal 1 - Progress (Week 1): Met PT Short Term Goal 2 (Week 1): Pt will complete bed<>chair transfers with modA and LRAD PT Short Term Goal 2 - Progress (Week 1): Met PT Short Term Goal 3 (Week 1): Pt will ambulate 91' with modA and LRAD PT Short Term Goal 3 - Progress (Week 1): Progressing toward goal PT Short Term Goal 4 (Week 1): Pt will improve BERG score by at least 8 points PT Short Term Goal 4 - Progress (Week 1): Progressing toward goal Week 2:  PT Short Term Goal 1 (Week 2): Pt will complete bed mobility with supervision and no bedrails. PT Short Term Goal 2 (Week 2): Pt will complete stand pivot transfers with minA and LRAD. PT Short Term Goal 3 (Week 2): Pt will ambulate 11' with MinA and LRAD. PT Short Term Goal 4 (Week 2): Pt will improve initial BERG score by at least 8 points. Week 3:     Skilled Therapeutic Interventions/Progress Updates:  Patient supine in bed on entrance to room. Patient alert and agreeable to PT session. Dtr is in room and open to training with mother to start family education training.   Patient with no pain complaint at start of session and resting comfortably.  Therapeutic Activity: Bed Mobility: Pt performed supine > sit with supervision. She is able to reach EOB with no physical assist and no vc for technique. VC to scoot forward and pt able to perform with proper hand placement/ positioning and slow technique. No LOB noted with pt sitting in midline! Transfers: Pt performed sit<>stand and stand pivot transfer toward R side to w/c. Demonstrated to dtr with vc for positioning of self and for pt. Questioning cues provided to pt  re: if bodily positioning is ready for transfers and pt able to scoot laterally toward w/c and reaches across to opposite armrest of w/c properly. Rises to stand with CGA and then pivot steps with RLE to sit in w/c with CGA. Returns to bed with similar technique and LOA. Dtr then provides hands on assist with pt completing and dtr relating not assisting but pt able to perform on own - CGA.  Good technique from dtr.   Gait Training:  Pt ambulated *** ft using *** with {LOA:33303}. Demonstrated ***. Provided vc/ tc for ***.  Wheelchair Mobility:  Pt propelled wheelchair *** feet with ***. Provided vc/ tc for ***.  Neuromuscular Re-ed: NMR facilitated during session with focus on ***. Pt guided in ***. NMR performed for improvements in motor control and coordination, balance, sequencing, judgement, and self confidence/ efficacy in performing all aspects of mobility at highest level of independence.   Patient {positioning:34499} at end of session with brakes locked, *** alarm set, and all needs within reach.   Therapy Documentation Precautions:  Precautions Precautions: Fall Recall of Precautions/Restrictions: Intact Precaution/Restrictions Comments: L hemi Required Braces or Orthoses: Spinal Brace Spinal Brace: Other (comment) (LSO) Spinal Brace Comments: for comfort for L4 fx Restrictions Weight Bearing Restrictions Per Provider Order: No  Pain:      Therapy/Group: Individual Therapy  Mliss DELENA Milliner PT, DPT, CSRS 10/03/2024, 6:22 PM

## 2024-10-03 NOTE — Progress Notes (Signed)
 Speech Language Pathology Daily Session Note  Patient Details  Name: Kim Santiago MRN: [id number] Date of Birth: 1934-12-15  Today's Date: 10/03/2024 SLP Individual Time: 1100-1158 1300-1345 SLP Individual Time Calculation (min): 58 min 45 minutes   Short Term Goals: Week 2: SLP Short Term Goal 1 (Week 2): Patient will demonstrate mildly complex problem solving skills in functional tasks given min multimodal a SLP Short Term Goal 2 (Week 2): Patient will recall and utilize memory compensatory strategies with supervision multimodal A SLP Short Term Goal 3 (Week 2): Patient will utilize compensatory strategies during consumption of least restrictive diet given supervison multimodal A  Skilled Therapeutic Interventions: Session 1: Skilled therapy session focused on cognitive goals. SLP facilitated session by targeting memory. Patient independently recalled events of AM w/ OT as well as in depth details of activity completed in ST yesterday. Patient unable to recall WRAP (write, repeat, associate, picture) memory strategies, therefore SLP re-educated. SLP encouraged use of strategies during paragraph retention task. After a 20 minute delay, patient recalled 100% of important information from paragraph independently. During delay, SLP challenged patient with money word problems. Patient with increased difficulty this date, requiring mod fading to minA. Patient left in bed with alarm set and call bell. Continue POC. Session 2: Skilled therapy session focused on cognitive goals. SLP facilitated session by prompting patient to participate in abstract card game. Patient required supervisionA to utilize problem solving skills and memory to recall and utilize rules of the game. SLP continued to target reasoning and problem solving through word deduction task. Patient required supervision-minA to complete. Patient left in bed with alarm set and call bell in reach. Continue POC.  Pain Session 1: none  reported  Session 2: none reported   Therapy/Group: Individual Therapy  Totiana Everson M.A., CCC-SLP 10/03/2024, 7:49 AM

## 2024-10-04 ENCOUNTER — Other Ambulatory Visit: Payer: Self-pay | Admitting: Internal Medicine

## 2024-10-04 DIAGNOSIS — E034 Atrophy of thyroid (acquired): Secondary | ICD-10-CM

## 2024-10-04 DIAGNOSIS — G8194 Hemiplegia, unspecified affecting left nondominant side: Secondary | ICD-10-CM | POA: Diagnosis not present

## 2024-10-04 DIAGNOSIS — I610 Nontraumatic intracerebral hemorrhage in hemisphere, subcortical: Secondary | ICD-10-CM | POA: Diagnosis not present

## 2024-10-04 NOTE — Progress Notes (Signed)
 Physical Therapy Session Note  Patient Details  Name: Kim Santiago MRN: [id number] Date of Birth: 1934/12/20  Today's Date: 10/04/2024 PT Individual Time: 1503-1530 PT Individual Time Calculation (min): 27 min   Short Term Goals: Week 2:  PT Short Term Goal 1 (Week 2): Pt will complete bed mobility with supervision and no bedrails. PT Short Term Goal 2 (Week 2): Pt will complete stand pivot transfers with minA and LRAD. PT Short Term Goal 3 (Week 2): Pt will ambulate 16' with MinA and LRAD. PT Short Term Goal 4 (Week 2): Pt will improve initial BERG score by at least 8 points.  Skilled Therapeutic Interventions/Progress Updates:      Pt supine in bed upon arrival. Pt agreeable to therapy. Pt denies any pain.   Pt endorses increased fatigue this afternoon, requesting to do bed level therapy.   Pt unable to recall spinal precuations, but aware she needs to wear LSO when OOB. Education provided for BLT. Pt verbalized understanding and agreeable.   Pt performed the following bed level therex for B LE strengthening, coordination, activity tolerance:   1x10 B LE SLR  1x10 heel slides B LE  1x10 B LE   1x20 B LE ankle pumps  1x10 B LE sidelying clamshells  2x10 hook lying knee fall outs  Pt performed rolling B with CGA, and supine to sit, sit to supine with min A, verbal cues provided for log roll technique   Pt supine in bed at end of sessino with all needs within reach ad bed alarm on.   Therapy Documentation Precautions:  Precautions Precautions: Fall Recall of Precautions/Restrictions: Intact Precaution/Restrictions Comments: L hemi Required Braces or Orthoses: Spinal Brace Spinal Brace: Other (comment) (LSO) Spinal Brace Comments: for comfort for L4 fx Restrictions Weight Bearing Restrictions Per Provider Order: No  Therapy/Group: Individual Therapy  [facility] Doreene Orris, [place], DPT  10/04/2024, 7:54 AM

## 2024-10-04 NOTE — Progress Notes (Signed)
 Patient ID: Kim Santiago, female   DOB: 1935/05/10, 89 y.o.   MRN: [id number] Met with pt and daughter who is present to discuss team conference progress toward CGA-min. Daughter has started to participate in therapies and she finds this helpful she still has concerns regarding the care and is looking into hired assist to give her a break and help also. Still focusing on discharge 2/4. Await team's recommendations and have gotten [facility] to accept her home health referral. Looking into insurance coverage for additional aide services.

## 2024-10-04 NOTE — Progress Notes (Signed)
 Occupational Therapy Session Note  Patient Details  Name: Kim Santiago MRN: [id number] Date of Birth: February 27, 1935  Today's Date: 10/04/2024 OT Individual Time: 8699-8655 OT Individual Time Calculation (min): 44 min    Short Term Goals: Week 2:  OT Short Term Goal 1 (Week 2): Pt complete LB dressing with MIN A OT Short Term Goal 2 (Week 2): Pt will complete toilet transfers with MOD A consistently using LRAD OT Short Term Goal 3 (Week 2): Pt will tolerate standing >2 min with ADL or therapeutic activity using LRAD OT Short Term Goal 4 (Week 2): Pt will complete toileting with MOD A using LRAD  Skilled Therapeutic Interventions/Progress Updates:     Pt received resting in bed with DTR present in room, participating throughout session for informal family education. Pt presenting to be in good spirits receptive to skilled OT session reporting 0/10 pain- OT offering intermittent rest breaks, repositioning, and therapeutic support to optimize participation in therapy session. Focused this session on functional transfer training to increase overall safety and independence at d/c. Pt transitioned to EOB using bed features with SUP +increased time and verbal cues for technique. Pt able to recall taught strategies from previous OT session to plant feet and stand tall like a tree to improve stability and trunk positioning prior to transferring. Stand pivot EOB > wc using RW MIN A +MAX multimodal cues for trunk positioning, L LE postioning, and proximity to RW. Engaged Pt in completing w/c propulsion to therapy gym to work on B UE coordination and endurance. Engaged Pt in completing blocked practice of short distance ambulatory transfers and stand pivot completing 6 total using RW. Engaged Pt's DTR in assisting with transfers to support improved carryover and confidence in care giving for Pt at d/c- Pt's DTR demo'ing improved insight into Pt's deficits and simple cues to provide to ensure safety.  Transported pt back to room via wc. Pt was left resting in bed with call bell in reach, bed alarm on, and all needs met.    Therapy Documentation Precautions:  Precautions Precautions: Fall Recall of Precautions/Restrictions: Intact Precaution/Restrictions Comments: L hemi Required Braces or Orthoses: Spinal Brace Spinal Brace: Other (comment) (LSO) Spinal Brace Comments: for comfort for L4 fx Restrictions Weight Bearing Restrictions Per Provider Order: No   Therapy/Group: Individual Therapy  [name] 10/04/2024, 7:24 AM

## 2024-10-04 NOTE — Progress Notes (Signed)
 Speech Language Pathology Daily Session Note  Patient Details  Name: Kim Santiago MRN: [id number] Date of Birth: [birth year]  Today's Date: 10/04/2024 SLP Individual Time: 1400-1500 SLP Individual Time Calculation (min): 60 min  Short Term Goals: Week 2: SLP Short Term Goal 1 (Week 2): Patient will demonstrate mildly complex problem solving skills in functional tasks given min multimodal a SLP Short Term Goal 2 (Week 2): Patient will recall and utilize memory compensatory strategies with supervision multimodal A SLP Short Term Goal 3 (Week 2): Patient will utilize compensatory strategies during consumption of least restrictive diet given supervison multimodal A  Skilled Therapeutic Interventions: SLP conducted skilled therapy session targeting cognitive goals. Patient recalled events from earlier therapy sessions with min assist. SLP then facilitated decoding task where patient completed basic to mildly complex number to letter conversions with decoding table to target processing speed and working memory. Patient benefited from supervision assist. Patient then completed basic to mildly complex math-based problem solving task where she provided solutions for functional scenarios with supervision to light min assist for identification of errors during problem solving process. During interaction with nursing, patient benefited from min to mod assist to recall names and purposes of medications. In final minutes of session, challenged working memory through art gallery manager of words in groups of 3 to 4 into various orders where she benefited from min to mod assist for working memory and event organiser. Patient was left in room with call bell in reach and alarm set. SLP will continue to target goals per plan of care.       Pain Pain Assessment Pain Scale: 0-10 Pain Score: 0-No pain Pain Location: Generalized Pain Intervention(s): Medication (See eMAR)  Therapy/Group: Individual  Therapy  Kim Santiago, M.A., CCC-SLP  Kim Santiago 10/04/2024, 3:05 PM

## 2024-10-04 NOTE — Progress Notes (Signed)
 Occupational Therapy Session Note  Patient Details  Name: Kim Santiago MRN: [id number] Date of Birth: [birth year]  Today's Date: 10/04/2024 OT Individual Time: 9054-8954 OT Individual Time Calculation (min): 60 min    Short Term Goals: Week 1:  OT Short Term Goal 1 (Week 1): Pt will maintain static sitting balance in preparation for transfers with SUP OT Short Term Goal 1 - Progress (Week 1): Met OT Short Term Goal 2 (Week 1): Pt will complete toilet transfers with MOD A OT Short Term Goal 2 - Progress (Week 1): Progressing toward goal OT Short Term Goal 3 (Week 1): Pt will recall hemi-dressing techniques with MIN questioning cues OT Short Term Goal 3 - Progress (Week 1): Met OT Short Term Goal 4 (Week 1): Pt will position L UE in bed or on tray table with SUP OT Short Term Goal 4 - Progress (Week 1): Met OT Short Term Goal 5 (Week 1): Pt complete LB dressing with MIN A OT Short Term Goal 5 - Progress (Week 1): Progressing toward goal  Skilled Therapeutic Interventions/Progress Updates:    Pt received in room with daughter present.  Discussed plans for home for toileting and daughter stated bathroom set up not accessible so she plans to have her mom use the [facility]. Today's session focused on slow and safe mobility of sit to stands, stand pivots with RW, standing static with and without RW, dynamic reach with UE support for clothing management, lateral leans for cleansing. Pt was able to void bowel and bladder but did need A for thorough cleansing and applying desitin cream.   Spent much of the session working on cues to stabilizing feet, posture and where to focus her gaze to increase her stability.   Pt put in excellent effort but was fatigued by the end of the session. Pt asking to lay down.  Had her practice slow controlled side steps to  L to move toward [facility]. Pt moved to supine well.  Pt worked on small bridges and AROM of arms for increased control of LUE.   Pt in room with dtr  and all needs met.   Therapy Documentation Precautions:  Precautions Precautions: Fall Recall of Precautions/Restrictions: Intact Precaution/Restrictions Comments: L hemi Required Braces or Orthoses: Spinal Brace Spinal Brace: Other (comment) (LSO) Spinal Brace Comments: for comfort for L4 fx Restrictions Weight Bearing Restrictions Per Provider Order: No    Vital Signs:   Pain: Pain Assessment Pain Scale: 0-10 Pain Score: 0-No pain ADL: ADL Equipment Provided: Reacher Eating: Minimal assistance Where Assessed-Eating: Bed level Grooming: Minimal assistance, Moderate assistance Where Assessed-Grooming: Edge of bed Upper Body Bathing: Minimal assistance Where Assessed-Upper Body Bathing: Edge of bed Lower Body Bathing: Moderate assistance, Maximal assistance Where Assessed-Lower Body Bathing: Edge of bed Upper Body Dressing: Minimal assistance Where Assessed-Upper Body Dressing: Edge of bed Lower Body Dressing: Moderate assistance Where Assessed-Lower Body Dressing: Edge of bed Toileting: Maximal assistance Where Assessed-Toileting: Bedside Commode (simulated) Toilet Transfer: Maximal assistance Toilet Transfer Method: Stand pivot Tub/Shower Transfer: Not assessed Film/video Editor: Not assessed      Therapy/Group: Individual Therapy  Kim Santiago 10/04/2024, 12:39 PM

## 2024-10-04 NOTE — Progress Notes (Signed)
 "                                                        PROGRESS NOTE   Subjective/Complaints:  Switched barrier cream to Desitin with less perineal burning discomfort  ROS:  Denies fevers, chills, vomiting , abdominal pain, SOB, chest pain, new weakness or paraesthesias.   +constipation- improved   Objective:   No results found.  Recent Labs    10/02/24 0451  WBC 3.7*  HGB 8.5*  HCT 24.4*  PLT 100*     Recent Labs    10/02/24 0451  NA 133*  K 4.3  CL 99  CO2 26  GLUCOSE 92  BUN 14  CREATININE 0.72  CALCIUM  8.3*     Intake/Output Summary (Last 24 hours) at 10/04/2024 0944 Last data filed at 10/04/2024 0606 Gross per 24 hour  Intake 600 ml  Output --  Net 600 ml        Physical Exam: Vital Signs Blood pressure (!) 107/58, pulse 79, temperature 98.5 F (36.9 C), temperature source Oral, resp. rate 16, height 5' 3 (1.6 m), weight 55.1 kg, SpO2 97%.   General: No acute distress.  Sitting in wheelchair, appears comfortable Awake, alert, mood and affect are appropriate. Heart: Regular rate and rhythm no rubs murmurs or extra sounds Lungs: Clear to auscultation, breathing unlabored, no rales or wheezes Abdomen: Positive bowel sounds, non-tender to palpation, nondistended Extremities: No clubbing, cyanosis, or edema Skin: No evidence of breakdown, no evidence of rash  Neurologic:  Awake, alert.  Oriented to self, place, and year. Moderately dysarthric-- Cranial nerves II through XII intact, MMT- 5/5 RUE and RLE, 4/5 LUE and LLE  Fine motor finger to thumb mildly reduced on left side  Sensory exam normal sensation to light touch and proprioception in bilateral upper and lower extremities Cerebellar exam dysmetria left finger nose finger  Musculoskeletal: Full range of motion in all 4 extremities. No joint swelling   Prior neuro assessment is c/w today's exam 10/04/2024.   Assessment/Plan: 1. Functional deficits which require 3+ hours per day of  interdisciplinary therapy in a comprehensive inpatient rehab setting. Physiatrist is providing close team supervision and 24 hour management of active medical problems listed below. Physiatrist and rehab team continue to assess barriers to discharge/monitor patient progress toward functional and medical goals  Care Tool:  Bathing    Body parts bathed by patient: Left arm, Chest, Abdomen, Front perineal area, Face   Body parts bathed by helper: Right arm, Buttocks, Right upper leg, Left upper leg, Left lower leg, Right lower leg     Bathing assist Assist Level: Maximal Assistance - Patient 24 - 49%     Upper Body Dressing/Undressing Upper body dressing   What is the patient wearing?: Pull over shirt, Bra    Upper body assist Assist Level: Minimal Assistance - Patient > 75%    Lower Body Dressing/Undressing Lower body dressing      What is the patient wearing?: Pants, Underwear/pull up     Lower body assist Assist for lower body dressing: Moderate Assistance - Patient 50 - 74%     Toileting Toileting    Toileting assist Assist for toileting: Maximal Assistance - Patient 25 - 49%     Transfers Chair/bed transfer  Transfers assist  Chair/bed transfer assist level: Maximal Assistance - Patient 25 - 49%     Locomotion Ambulation   Ambulation assist      Assist level: Moderate Assistance - Patient 50 - 74% Assistive device: Other (comment) (R hand rail) Max distance: 30'   Walk 10 feet activity   Assist     Assist level: Moderate Assistance - Patient - 50 - 74% Assistive device: Other (comment) (R hand rail)   Walk 50 feet activity   Assist    Assist level: Maximal Assistance - Patient 25 - 49% Assistive device: Walker-Eva    Walk 150 feet activity   Assist Walk 150 feet activity did not occur: Safety/medical concerns         Walk 10 feet on uneven surface  activity   Assist Walk 10 feet on uneven surfaces activity did not occur:  Safety/medical concerns         Wheelchair     Assist Is the patient using a wheelchair?: Yes Type of Wheelchair: Manual    Wheelchair assist level: Total Assistance - Patient < 25% Max wheelchair distance: 58'    Wheelchair 50 feet with 2 turns activity    Assist        Assist Level: Total Assistance - Patient < 25%   Wheelchair 150 feet activity     Assist      Assist Level: Total Assistance - Patient < 25%   Blood pressure (!) 107/58, pulse 79, temperature 98.5 F (36.9 C), temperature source Oral, resp. rate 16, height 5' 3 (1.6 m), weight 55.1 kg, SpO2 97%.  Medical Problem List and Plan: 1. Functional deficits secondary to right thalamic intraparenchymal hemorrhage             -patient may shower             -ELOS/Goals: 2/4 MinA             -Stable to continue CIR   2.  Antithrombotics: -DVT/anticoagulation:  Pharmaceutical: Lovenox              -antiplatelet therapy: N/A due to thalamic hemorrhage 3. Pain Management: Tylenol  prn mild pain and oxycodone  prn severe pain             --will add K pad for local measures. Voltaren gel to heel  L4 comp fx acute as well as Lumbar spinal stenosis  4. Mood/Behavior/Sleep:  LCSW to follow for evaluation and support.             --Melatonin prn for insomnia             -antipsychotic agents: N/A    5. Neuropsych/cognition: This patient is capable of making decisions on her own behalf. 6. Skin/Wound Care: Routine pressure relief measures.              --TLSO d/c due to excessive bruising with use.              --  - Bruising stable  Still sore over bruised area but improved Trial desitin over peri area  7. Fluids/Electrolytes/Nutrition: Monitor I/O. Continue nutritional supplements 8. Hypotension: decrease cozaar  to 12.5mg  after supper   - 1-17: Poor p.o. intakes due to abdominal pain/excessive bowel movements today.  BP 90s over 40s this evening.  Will give some IV fluids 500 cc at 125/h and hold  Cozaar  1-18: BP remains soft, responded to fluid bolus, likely losses from excessive bowel movements and poor p.o. intakes.  Start gentle IV fluids 50  cc/h today.  Vitals:   10/03/24 1938 10/04/24 0624  BP: 108/62 (!) 107/58  Pulse: 91 79  Resp: 16 16  Temp: 97.8 F (36.6 C) 98.5 F (36.9 C)  SpO2: 97% 97%  BPs ok, Normal  orthostatic vitals     9.  Chronic hyponatremia: Continue to monitor for stability       Mild asymptomatic      Latest Ref Rng & Units 10/02/2024    4:51 AM 09/28/2024    6:08 AM 09/25/2024    5:10 AM  BMP  Glucose 70 - 99 mg/dL 92  92  90   BUN 8 - 23 mg/dL 14  12  16    Creatinine 0.44 - 1.00 mg/dL 9.27  9.36  9.36   Sodium 135 - 145 mmol/L 133  134  130   Potassium 3.5 - 5.1 mmol/L 4.3  4.4  4.0   Chloride 98 - 111 mmol/L 99  101  99   CO2 22 - 32 mmol/L 26  25  25    Calcium  8.9 - 10.3 mg/dL 8.3  8.3  8.0     10. L4 Compression Fx: Has spinal stenosis with impingement of L4 and L5 nerves per imaging --Chronic L1 compression Fx s/p kyphoplasty.  LSO was used at [facility] but not being used at the hospital per PT daughter --Will order for support.  Using LSO instead of TLSO for comfort reasons -1/24 reports pain overall under control   11.  Chronic diastolic HF: Monitor for signs of overload. Check daily weighs.              --Used lasix  prn PTA   - Weight stable, no signs of volume overload 1-18  -1/25 no signs of volume overload noted Filed Weights   09/28/24 0620 10/02/24 0454 10/04/24 0500  Weight: 59.5 kg 57.9 kg 55.1 kg   Some increased weight but no edema or breathing issues   12. Hx of Sjogren syndrome/cutaneous Lupus:On low dose prednisone .  --will resume Methotrexate  per recommendations--every Sunday per family --Eye drops qid with lacrilube at nights.    13. Pancytopenia due to  autoimmune disease : Baseline Hgb around 9             --Was transfused w/one unit on 12/23 and stable. Monitor for signs of bleeding             --no signs of  bleeding , Hgb down 1 gm but may be hemodilution       Latest Ref Rng & Units 10/02/2024    4:51 AM 09/28/2024    6:08 AM 09/25/2024    5:10 AM  CBC  WBC 4.0 - 10.5 K/uL 3.7  2.6  8.0   Hemoglobin 12.0 - 15.0 g/dL 8.5  8.5  8.5   Hematocrit 36.0 - 46.0 % 24.4  24.6  24.4   Platelets 150 - 400 K/uL 100  104  113     14.H/o Depression: continue Zoloft.    15. Hx of Left foot ankle pain: Calcaneal enthesopathy with tendinosis distal Achilles tendon.     16. Hypothyroid s/p thyroidectomy: continue supplement.     17 .  Constipation -   - 1-21 had a smear , on Miralax  every day, add senna S 1 po at bedtime   -1/24 sorbitol  30ml for constipation  -1/25 had a couple smaller bowel movements last night, will increase MiraLAX  to twice daily  LOS: 14 days A FACE TO FACE EVALUATION WAS PERFORMED  Prentice  E Anastasio Wogan 10/04/2024, 9:44 AM     "

## 2024-10-04 NOTE — Patient Care Conference (Signed)
 Inpatient RehabilitationTeam Conference and Plan of Care Update Date: 10/04/2024   Time: 10:45 AM    Patient Name: Kim Santiago      Medical Record Number: [id number]  Date of Birth: [birth year] Sex: Female         Room/Bed: 4W09C/4W09C-01 Payor Info: Payor: [employer] / Plan: [employer] PPO / Product Type: *No Product type* /    Admit Date/Time:  09/20/2024 12:39 PM  Primary Diagnosis:  Thalamic hemorrhage [facility])  Hospital Problems: Principal Problem:   Thalamic hemorrhage [facility])    Expected Discharge Date: Expected Discharge Date: 10/11/24  Team Members Present: Physician leading conference: Dr. Prentice Compton Social Worker Present: Rhoda Clement, LCSW Nurse Present: Barnie Ronde, RN PT Present: Sherlean Perks, PT OT Present: Katheryn Mines, OT SLP Present: Recardo Mole, SLP PPS Coordinator present : Eleanor Colon, SLP     Current Status/Progress Goal Weekly Team Focus  Bowel/Bladder   Patient is incontinent of bowel and bladder. LBM is 1/270   Patient will become continent of both bowel and bladder.   Monitor bowel and bladder patterns to prevent constipation, diarrhea or urinary issues. Encourage toileting and assist with toileting needs.    Swallow/Nutrition/ Hydration   reg/thin, meds whole in puree   supervision  tolerance of diet, use of strategies including sitting upright during and after PO due suspected slowed esophageal motility    ADL's   UB ADL CGA, LB ADL MOD A, toileting MOD A, stand pivot with RW MIN A // Barriers: cooridnation, L hemibody proprioception, dual tasksing, pain in buttocks, mild cognitive deficits, L inattention   CGA toilet transfer, MIN A shower transfers, MIN A bathing/toileting, CGA U/LB dressing   family education, activity tolerane, sitting balance, sit<>stands, L hemibody NMRE, ADL retraining, L attention    Mobility   supervision for bed mobility, CGA/minA for stand pivot transfers, ambulating 83' with minA  and RW (can be modA at times)   supervision/ CGAfor bed mobility / transfer, MinA for ambulation  L sided NMR, general activity tolerance and endurance training, functional transfers, functional gait, pt and family education    Communication                Safety/Cognition/ Behavioral Observations  minA reaching goals- reduce frequency to 3x   supervisionA   memory, problem solving, increasing processing speed    Pain   Patient denies pain.   Patient will remain free of pain.   Assess pain every shift and as needed and provide appropriate pain management interventions as ordered.    Skin   Patient has generalized bruising to buttocks, labia, neck, bilateral arms and chest.   Promote skin integrity and reduce redness/soreness to buttocks and generalized bruising.  Ongoing skin assessment every shift and as needed and monitor for signs of infection. Prevent moisture-related skin issues by providing peri-care after each voiding or bowel movement, and perform frequent turning and repositioning.      Discharge Planning:  Daughter has been here and participated in some therapies when appropriate, she is concerned can not provide the assist Mom will need. Encouraged team to include her when here. Will set up [facility] and await equipment needs   Team Discussion: Patient admitted post thalamic hemorrhage with nausea, poor coordination, left side inattention, sensory deficits and balance issues. Progress limited by nausea and fatigue and motor planning issues; has trouble with turns and needs cues for safety.  Patient on target to meet rehab goals: yes, currently needs mod assist for lower  body care and toileting.  Needs min assist for stand pivot and ambulate up to 75' using a RW.  Able to manage swallowing strategies for esophogeal motility issues with supervision at meals.  Needs min assist for problem solving and memory.  *See Care Plan and progress notes for long and short-term goals.    Revisions to Treatment Plan:  N/a   Teaching Needs: Safety medications, transfers, toileting, etc.   Current Barriers to Discharge: Decreased caregiver support and Home enviroment access/layout  Possible Resolutions to Barriers: Family education HH follow up services DME: hospital bed, wheelchair, drop arm [facility]     Medical Summary Current Status: Chronic constipation a little better, skin improved  Barriers to Discharge: Incontinence;Other (comments)   Possible Resolutions to [employer] Focus: manage bowels, monitor pancytopenis and perineal irritation, assistive equipment eval   Continued Need for Acute Rehabilitation Level of Care: The patient requires daily medical management by a physician with specialized training in physical medicine and rehabilitation for the following reasons: Direction of a multidisciplinary physical rehabilitation program to maximize functional independence : Yes Medical management of patient stability for increased activity during participation in an intensive rehabilitation regime.: Yes Analysis of laboratory values and/or radiology reports with any subsequent need for medication adjustment and/or medical intervention. : Yes   I attest that I was present, lead the team conference, and concur with the assessment and plan of the team.   Fredericka Sober B 10/04/2024, 3:06 PM

## 2024-10-05 DIAGNOSIS — D61818 Other pancytopenia: Secondary | ICD-10-CM

## 2024-10-05 LAB — CBC WITH DIFFERENTIAL/PLATELET
Abs Immature Granulocytes: 0.05 10*3/uL (ref 0.00–0.07)
Basophils Absolute: 0.1 10*3/uL (ref 0.0–0.1)
Basophils Relative: 2 %
Eosinophils Absolute: 0.2 10*3/uL (ref 0.0–0.5)
Eosinophils Relative: 5 %
HCT: 25.5 % — ABNORMAL LOW (ref 36.0–46.0)
Hemoglobin: 8.8 g/dL — ABNORMAL LOW (ref 12.0–15.0)
Immature Granulocytes: 2 %
Lymphocytes Relative: 47 %
Lymphs Abs: 1.3 10*3/uL (ref 0.7–4.0)
MCH: 35.1 pg — ABNORMAL HIGH (ref 26.0–34.0)
MCHC: 34.5 g/dL (ref 30.0–36.0)
MCV: 101.6 fL — ABNORMAL HIGH (ref 80.0–100.0)
Monocytes Absolute: 0.2 10*3/uL (ref 0.1–1.0)
Monocytes Relative: 6 %
Neutro Abs: 1.1 10*3/uL — ABNORMAL LOW (ref 1.7–7.7)
Neutrophils Relative %: 38 %
Platelets: 100 10*3/uL — ABNORMAL LOW (ref 150–400)
RBC: 2.51 MIL/uL — ABNORMAL LOW (ref 3.87–5.11)
RDW: 19 % — ABNORMAL HIGH (ref 11.5–15.5)
WBC: 2.8 10*3/uL — ABNORMAL LOW (ref 4.0–10.5)
nRBC: 0.7 % — ABNORMAL HIGH (ref 0.0–0.2)

## 2024-10-05 LAB — BASIC METABOLIC PANEL WITH GFR
Anion gap: 6 (ref 5–15)
BUN: 15 mg/dL (ref 8–23)
CO2: 28 mmol/L (ref 22–32)
Calcium: 8.6 mg/dL — ABNORMAL LOW (ref 8.9–10.3)
Chloride: 99 mmol/L (ref 98–111)
Creatinine, Ser: 0.7 mg/dL (ref 0.44–1.00)
GFR, Estimated: >60 mL/min
Glucose, Bld: 89 mg/dL (ref 70–99)
Potassium: 4.3 mmol/L (ref 3.5–5.1)
Sodium: 134 mmol/L — ABNORMAL LOW (ref 135–145)

## 2024-10-05 MED ORDER — SORBITOL 70 % SOLN: 30.0000 mL | Freq: Every day | Status: DC | PRN

## 2024-10-05 NOTE — Progress Notes (Signed)
 Patient ID: Kim Santiago, female   DOB: 09-24-1934, 89 y.o.   MRN: [id number] Met with pt and daughter to discuss the equipment she will need at home. Discussed hospital bed, drop-arm bedside commode and wheelchair. Both in agreement will order via [employer] since no preference. Hopefully this way the equipment will be in place by discharge on Wednesday

## 2024-10-05 NOTE — Progress Notes (Signed)
 Occupational Therapy Weekly Progress Note  Patient Details  Name: Kim Santiago MRN: [id number] Date of Birth: [birth year]  Beginning of progress report period: September 28, 2024 End of progress report period: October 05, 2024  Today's Date: 10/05/2024 OT Individual Time: 8892-8798 OT Individual Time Calculation (min): 54 min    Patient has met 4 of 4 short term goals. Mrs. Cuthbert is steadily progressing towards reaching her LTGs. She is completing UB ADLs seated in wc SUP, LB ADL MIN A, toileting MIN A, and stand pivot transfers using RW MIN A progressing to CGA. She is planning to d/c home with her DTR who has been present for therapy sessions and can provide 24/7 SUP.   Patient continues to demonstrate the following deficits: muscle weakness, decreased cardiorespiratoy endurance, impaired timing and sequencing, unbalanced muscle activation, motor apraxia, decreased coordination, and decreased motor planning, decreased midline orientation and decreased attention to left, decreased attention, decreased awareness, decreased problem solving, and decreased safety awareness, central origin, and decreased sitting balance, decreased standing balance, decreased postural control, hemiplegia, and decreased balance strategies and therefore will continue to benefit from skilled OT intervention to enhance overall performance with BADL and Reduce care partner burden.  Patient progressing toward long term goals..  Continue plan of care.  OT Short Term Goals Week 2:  OT Short Term Goal 1 (Week 2): Pt complete LB dressing with MIN A OT Short Term Goal 1 - Progress (Week 2): Met OT Short Term Goal 2 (Week 2): Pt will complete toilet transfers with MOD A consistently using LRAD OT Short Term Goal 2 - Progress (Week 2): Met OT Short Term Goal 3 (Week 2): Pt will tolerate standing >2 min with ADL or therapeutic activity using LRAD OT Short Term Goal 3 - Progress (Week 2): Met OT Short Term Goal 4 (Week 2): Pt  will complete toileting with MOD A using LRAD OT Short Term Goal 4 - Progress (Week 2): Met Week 3:  OT Short Term Goal 1 (Week 3): STG=LTG d/t ELOS  Skilled Therapeutic Interventions/Progress Updates:     Pt received sitting up in wc, dressed and ready for the day. Pt presenting to be in good spirits receptive to skilled OT session reporting 0/10 pain- OT offering intermittent rest breaks, repositioning, and therapeutic support to optimize participation in therapy session. LSO donned upon OT arrival. Focused this session on functional transfers, B UE strengthening, standing tolerance, and standing balance. Transported Pt to/from gym this session in wc for time management and energy conservation.  Pt participated in blocked practice of stand pivot transfers using RW with emphasis on RW management skills, L foot placement, and trunk positioning. Pt able to complete 4 trials with CGA-Min A with greatest amount of assistance required when turning to L as Pt tries to move too quickly.  Engaged Pt in dynamic standing balance connect-four activity to work on standing tolerance, L UE functional use, body awareness, and standing balance. Able to complete 3 trials tolerating standing for 3 minx1 trial and x2 trials using single UE support on RW CGA. Able to place game pieces using L hand with MIN dropping.  While seated in wc, Pt completed B UE exercises with focus on coordination and endurance training 1x10 reps chest press, overhead press, bicep curls, and anterior shoulder flexion using 3.3# weighted ball with prolonged seated rest breaks provided between and following exercises. Pt fatigued at end of session. Stand pivot wc > EOB using RW light MIN A. EOB > supine  SUP. Pt was left resting in bed with call bell in reach, bed alarm on, and all needs met.     Therapy Documentation Precautions:  Precautions Precautions: Fall Recall of Precautions/Restrictions: Intact Precaution/Restrictions Comments: L  hemi Required Braces or Orthoses: Spinal Brace Spinal Brace: Other (comment) (LSO) Spinal Brace Comments: for comfort for L4 fx Restrictions Weight Bearing Restrictions Per Provider Order: No   Therapy/Group: Individual Therapy  [name] 10/05/2024, 7:21 AM

## 2024-10-05 NOTE — Progress Notes (Signed)
 "                                                        PROGRESS NOTE   Subjective/Complaints:  Patient reports she has not had bowel movement in about 2 days.  Chronic back pain overall controlled with Tylenol .  No additional concerns or complaints.  ROS:  Denies fevers, chills, vomiting , abdominal pain, SOB, chest pain, new weakness or paraesthesias.   +constipation-intermittent. + Chronic back pain  Objective:   No results found.  Recent Labs    10/05/24 0505  WBC 2.8*  HGB 8.8*  HCT 25.5*  PLT 100*     Recent Labs    10/05/24 0505  NA 134*  K 4.3  CL 99  CO2 28  GLUCOSE 89  BUN 15  CREATININE 0.70  CALCIUM  8.6*     Intake/Output Summary (Last 24 hours) at 10/05/2024 1129 Last data filed at 10/05/2024 0521 Gross per 24 hour  Intake 410 ml  Output --  Net 410 ml        Physical Exam: Vital Signs Blood pressure 122/71, pulse 73, temperature 98.4 F (36.9 C), temperature source Oral, resp. rate 16, height 5' 3 (1.6 m), weight 55.1 kg, SpO2 96%.   General: No acute distress.  Laying in bed appears comfortable Awake, alert, mood and affect are appropriate. Heart: Regular rate and rhythm no rubs murmurs or extra sounds Lungs: Clear to auscultation bilaterally, no increased work of breathing Abdomen: Soft, nontender, nondistended, positive bowel sounds Extremities: No clubbing, cyanosis, or edema Skin: No evidence of breakdown noted on visible portion  Neurologic:  Awake, alert.  Oriented to self, place, and year. Moderately dysarthric-- Cranial nerves II through XII intact, MMT- 5/5 RUE and RLE, 4/5 LUE and LLE  Fine motor finger to thumb mildly reduced on left side  Sensory exam normal sensation to light touch and proprioception in bilateral upper and lower extremities Cerebellar exam dysmetria left finger nose finger  Musculoskeletal: Full range of motion in all 4 extremities. No joint swelling   Prior neuro assessment is c/w today's exam  10/05/2024.   Assessment/Plan: 1. Functional deficits which require 3+ hours per day of interdisciplinary therapy in a comprehensive inpatient rehab setting. Physiatrist is providing close team supervision and 24 hour management of active medical problems listed below. Physiatrist and rehab team continue to assess barriers to discharge/monitor patient progress toward functional and medical goals  Care Tool:  Bathing    Body parts bathed by patient: Left arm, Chest, Abdomen, Front perineal area, Face   Body parts bathed by helper: Right arm, Buttocks, Right upper leg, Left upper leg, Left lower leg, Right lower leg     Bathing assist Assist Level: Maximal Assistance - Patient 24 - 49%     Upper Body Dressing/Undressing Upper body dressing   What is the patient wearing?: Pull over shirt, Bra    Upper body assist Assist Level: Minimal Assistance - Patient > 75%    Lower Body Dressing/Undressing Lower body dressing      What is the patient wearing?: Pants, Underwear/pull up     Lower body assist Assist for lower body dressing: Moderate Assistance - Patient 50 - 74%     Toileting Toileting    Toileting assist Assist for toileting: Maximal Assistance - Patient 25 -  49%     Transfers Chair/bed transfer  Transfers assist     Chair/bed transfer assist level: Maximal Assistance - Patient 25 - 49%     Locomotion Ambulation   Ambulation assist      Assist level: Moderate Assistance - Patient 50 - 74% Assistive device: Other (comment) (R hand rail) Max distance: 30'   Walk 10 feet activity   Assist     Assist level: Moderate Assistance - Patient - 50 - 74% Assistive device: Other (comment) (R hand rail)   Walk 50 feet activity   Assist    Assist level: Maximal Assistance - Patient 25 - 49% Assistive device: Walker-Eva    Walk 150 feet activity   Assist Walk 150 feet activity did not occur: Safety/medical concerns         Walk 10 feet on  uneven surface  activity   Assist Walk 10 feet on uneven surfaces activity did not occur: Safety/medical concerns         Wheelchair     Assist Is the patient using a wheelchair?: Yes Type of Wheelchair: Manual    Wheelchair assist level: Total Assistance - Patient < 25% Max wheelchair distance: 50'    Wheelchair 50 feet with 2 turns activity    Assist        Assist Level: Total Assistance - Patient < 25%   Wheelchair 150 feet activity     Assist      Assist Level: Total Assistance - Patient < 25%   Blood pressure 122/71, pulse 73, temperature 98.4 F (36.9 C), temperature source Oral, resp. rate 16, height 5' 3 (1.6 m), weight 55.1 kg, SpO2 96%.  Medical Problem List and Plan: 1. Functional deficits secondary to right thalamic intraparenchymal hemorrhage             -patient may shower             -ELOS/Goals: 2/4 MinA             -Stable to continue CIR  - Expected discharge 10/11/2024   2.  Antithrombotics: -DVT/anticoagulation:  Pharmaceutical: Lovenox              -antiplatelet therapy: N/A due to thalamic hemorrhage 3. Pain Management: Tylenol  prn mild pain and oxycodone  prn severe pain             --will add K pad for local measures. Voltaren gel to heel  L4 comp fx acute as well as Lumbar spinal stenosis  4. Mood/Behavior/Sleep:  LCSW to follow for evaluation and support.             --Melatonin prn for insomnia             -antipsychotic agents: N/A    5. Neuropsych/cognition: This patient is capable of making decisions on her own behalf. 6. Skin/Wound Care: Routine pressure relief measures.              --TLSO d/c due to excessive bruising with use.              --  - Bruising stable  Still sore over bruised area but improved Trial desitin over peri area  7. Fluids/Electrolytes/Nutrition: Monitor I/O. Continue nutritional supplements 8. Hypotension: decrease cozaar  to 12.5mg  after supper   - 1-17: Poor p.o. intakes due to abdominal  pain/excessive bowel movements today.  BP 90s over 40s this evening.  Will give some IV fluids 500 cc at 125/h and hold Cozaar  1-18: BP remains soft, responded  to fluid bolus, likely losses from excessive bowel movements and poor p.o. intakes.  Start gentle IV fluids 50 cc/h today.  Vitals:   10/04/24 1939 10/05/24 0326  BP: 131/66 122/71  Pulse: 79 73  Resp: 17 16  Temp: 97.8 F (36.6 C) 98.4 F (36.9 C)  SpO2: 100% 96%  BPs ok, Normal  orthostatic vitals -1/29 BP stable continue to monitor    9.  Chronic hyponatremia: Continue to monitor for stability       Mild asymptomatic -1/29 sodium improved to 134      Latest Ref Rng & Units 10/05/2024    5:05 AM 10/02/2024    4:51 AM 09/28/2024    6:08 AM  BMP  Glucose 70 - 99 mg/dL 89  92  92   BUN 8 - 23 mg/dL 15  14  12    Creatinine 0.44 - 1.00 mg/dL 9.29  9.27  9.36   Sodium 135 - 145 mmol/L 134  133  134   Potassium 3.5 - 5.1 mmol/L 4.3  4.3  4.4   Chloride 98 - 111 mmol/L 99  99  101   CO2 22 - 32 mmol/L 28  26  25    Calcium  8.9 - 10.3 mg/dL 8.6  8.3  8.3     10. L4 Compression Fx: Has spinal stenosis with impingement of L4 and L5 nerves per imaging --Chronic L1 compression Fx s/p kyphoplasty.  LSO was used at [facility] but not being used at the hospital per PT daughter --Will order for support.  Using LSO instead of TLSO for comfort reasons -1/29 patient reports pain is under control continue Tylenol    11.  Chronic diastolic HF: Monitor for signs of overload. Check daily weighs.              --Used lasix  prn PTA   - Weight stable, no signs of volume overload 1-18  -1/25 no signs of volume overload noted Filed Weights   10/02/24 0454 10/04/24 0500 10/05/24 0326  Weight: 57.9 kg 55.1 kg 55.1 kg   Some increased weight but no edema or breathing issues   12. Hx of Sjogren syndrome/cutaneous Lupus:On low dose prednisone .  --will resume Methotrexate  per recommendations--every Sunday per family --Eye drops qid with lacrilube at  nights.    13. Pancytopenia due to  autoimmune disease : Baseline Hgb around 9             --Was transfused w/one unit on 12/23 and stable. Monitor for signs of bleeding             --no signs of bleeding , Hgb down 1 gm but may be hemodilution   -1/29 hemoglobin stable at 8.8. WBC 2.8 and platelets 100.  Continue to monitor      Latest Ref Rng & Units 10/05/2024    5:05 AM 10/02/2024    4:51 AM 09/28/2024    6:08 AM  CBC  WBC 4.0 - 10.5 K/uL 2.8  3.7  2.6   Hemoglobin 12.0 - 15.0 g/dL 8.8  8.5  8.5   Hematocrit 36.0 - 46.0 % 25.5  24.4  24.6   Platelets 150 - 400 K/uL 100  100  104     14.H/o Depression: continue Zoloft.    15. Hx of Left foot ankle pain: Calcaneal enthesopathy with tendinosis distal Achilles tendon.     16. Hypothyroid s/p thyroidectomy: continue supplement.     17 .  Constipation -   - 1-21 had a smear , on  Miralax  every day, add senna S 1 po at bedtime   -1/24 sorbitol  30ml for constipation  -1/25 had a couple smaller bowel movements last night, will increase MiraLAX  to twice daily  -1/29 patient asked for second dose of MiraLAX  today, appears its already ordered twice a day.  It does look like she declined her a.m. dose yesterday but took the dose yesterday evening and this morning.  Will add sorbitol  as needed-discussed with patient and daughter. LOS: 15 days A FACE TO FACE EVALUATION WAS PERFORMED  Murray Collier 10/05/2024, 11:29 AM     "

## 2024-10-05 NOTE — Discharge Instructions (Signed)
 COMMUNITY REFERRALS UPON DISCHARGE:    Home Health:   PT      OT       SP     AIDE                Agency:  [facility]    [phone]    Medical Equipment/Items Ordered: HOSPITAL BED, DROP-ARM BEDSIDE COMMODE AND [facility]                                                 Agency/Supplier:[facility]   [phone]

## 2024-10-05 NOTE — Progress Notes (Signed)
 Physical Therapy Session Note  Patient Details  Name: Kim Santiago MRN: [id number] Date of Birth: 08-Sep-1934  Today's Date: 10/05/2024 PT Individual Time: 0953-1050 and 1350-1500 PT Individual Time Calculation (min): 57 min and 70 min  Short Term Goals: Week 2:  PT Short Term Goal 1 (Week 2): Pt will complete bed mobility with supervision and no bedrails. PT Short Term Goal 2 (Week 2): Pt will complete stand pivot transfers with minA and LRAD. PT Short Term Goal 3 (Week 2): Pt will ambulate 43' with MinA and LRAD. PT Short Term Goal 4 (Week 2): Pt will improve initial BERG score by at least 8 points.  Skilled Therapeutic Interventions/Progress Updates: Tx1:Pt presented in bed with dgt present agreeable to therapy. Pt denies pain during session. Completed supine to sit with supervision and use of bed features with increased time. Pt completed stand step transfer with HHA and increased time with PTA providing verbal cues for stepping with LLE. Pt transported to day room for time management and worked on investment banker, operational with RW. Pt ambulated 13ft and 88ft with seated rest. Pt required multimodal cues for erect posture, maintaining close proximity to RW, and increasing step length with LLE. Pt required minA when completing turn to L during ambulation as tended to move but keeping RW at arms length. Pt when worked on stand step to [employer] performing with minA with cues for slowing down and thinking about each step. Pt worked on Sit to stand with LLE on 4in step, after second step pt indicated urgent need for bathroom. Completed squat pivot transfer with close supervision to w/c and transported back to room. BSC set up over toilet and pt performed stand step to R with CGA and adequate stepping, due to urgency clothing management total A with pt having continent BM at toilet (charted). Pt was mod I with peri-care and required minA for clothing management. Completed stand step to L with minA back to w/c. Pt  tranpsorted back to day room and participated in Cybex Kinetron 60cm/sec 2 x 2 min for general conditioning of BLE. Pt transported back to room and remained in w/c at end of session with call bell within reach and needs met.   Tx2: Pt presented in bed agreeable to therapy. Pt denies pain at start of session. Pt completed supine to sit to R with supervision and use of bed features. Pt completed ambulatory transfer to w/c with RW and CGA with increased time and verbal cues for sequencing (stand tall, step with L foot, etc). Pt transported to day room and performed block practice stand step transfers to mat. Pt overall required CGA however continued use of verbal cues as previous. Pt then participated in dance group for psychosocial interaction. Participated in AROM activities UE/LE all planes to varying beats with PTA providing min cues at times for improved coordination of L side. Pt able to work consistently through dance group with minimal extra brakes. Pt transported back to room and completed stand step transfer with CGA to bed. After doffing shoes completed sit to supine with supervision and bed flat. Pt repositioned to comfort and left in bed at end of session with bed alarm on, call bell within reach and needs met.      Therapy Documentation Precautions:  Precautions Precautions: Fall Recall of Precautions/Restrictions: Intact Precaution/Restrictions Comments: L hemi Required Braces or Orthoses: Spinal Brace Spinal Brace: Other (comment) (LSO) Spinal Brace Comments: for comfort for L4 fx Restrictions Weight Bearing Restrictions Per Provider Order: No  Therapy/Group: Individual Therapy  Annalyssa Thune 10/05/2024, 12:21 PM

## 2024-10-06 NOTE — Progress Notes (Signed)
 Occupational Therapy Session Note  Patient Details  Name: Kim Santiago MRN: [id number] Date of Birth: [birth year]  Today's Date: 10/06/2024 OT Individual Time: 9182-9069 OT Individual Time Calculation (min): 73 min    Short Term Goals: Week 3:  OT Short Term Goal 1 (Week 3): STG=LTG d/t ELOS  Skilled Therapeutic Interventions/Progress Updates:     Pt received resting in bed with RN present in room providing morning medication. Pt presenting to be in good spirits receptive to skilled OT session reporting 0/10 pain- OT offering intermittent rest breaks, repositioning, and therapeutic support to optimize participation in therapy session. Focused this session on ADL retraining, body awareness, standing balance, and standing tolerance.   Pt urgently requesting to use restroom d/t need for B&B. Obtained BSC, however Pt with accident in bed d/t urgency. Worked on rolling R<>L and bridging in bed during clean-up process. Pt motivated to assist with peri-care and donning clean brief, however MAX A required to ensure cleanliness.   Supine > EOB using bed rail MIN A to lift trunk. Completed U/LB dressing EOB with CGA-MIN A required for sitting balance when donning [place] shirt and LSO d/t L lean in sitting. Able to weave feet into pants with CGA for sitting balance. When standing to bring pants to waist, Pt with increase L lean requiring consistent verbal cues to maintain upright posture. Worked on alternating pulling up pants with R/LUE with MIN A required for standing balance 2/2 fatigue this AM.   Stand pivot EOB > wc using RW light MIN A provided for balance and MOD verbal cues for trunk position and proximity to RW. Sat at sink for grooming/hygiene tasks for energy conservation and safety. Pt able to groom hair, wash face, brush teeth, and wash hands with set-up +increased time required to manipulate small toiletry items with L hand.   Pt completed w/c propulsion to therapy gym to work on B UE  coordination and w/c propulsion skills with min A and MOD verbal cues required to push equally with both arms and to avoid bumping obstacles in L visual field.   Engaged Pt in dynamic standing balance functional reaching activity to work on L UE targeted reach, standing tolerance/balance, and maintaining balance with single UE support on RW. Pt tasked with retrieving and placing squigz on window using RW for balance and noticing need to correct balance prior to fatiguing. She was able to stand for 3 bouts of 2 min with CGA-MIN A with MOD verbal cues required to correct L lean and initiate rest breaks. Improved awareness of L or posterior lean as session progressed.    Transport back to room in wc.  Stand pivot wc > EOB using RW light MIN A. EOB > supine SUP. Pt was left resting in bed with call bell in reach, bed alarm on, and all needs met.    Therapy Documentation Precautions:  Precautions Precautions: Fall Recall of Precautions/Restrictions: Intact Precaution/Restrictions Comments: L hemi Required Braces or Orthoses: Spinal Brace Spinal Brace: Other (comment) (LSO) Spinal Brace Comments: for comfort for L4 fx Restrictions Weight Bearing Restrictions Per Provider Order: No   Therapy/Group: Individual Therapy  [name] 10/06/2024, 7:27 AM

## 2024-10-06 NOTE — Progress Notes (Signed)
 Physical Therapy Session Note  Patient Details  Name: Kim Santiago MRN: [id number] Date of Birth: July 19, 1935  Today's Date: 10/06/2024 PT Individual Time: 1120-1204 PT Individual Time Calculation (min): 44 min   Short Term Goals: Week 1:  PT Short Term Goal 1 (Week 1): Pt will complete bed mobility with minA PT Short Term Goal 1 - Progress (Week 1): Met PT Short Term Goal 2 (Week 1): Pt will complete bed<>chair transfers with modA and LRAD PT Short Term Goal 2 - Progress (Week 1): Met PT Short Term Goal 3 (Week 1): Pt will ambulate 89' with modA and LRAD PT Short Term Goal 3 - Progress (Week 1): Progressing toward goal PT Short Term Goal 4 (Week 1): Pt will improve BERG score by at least 8 points PT Short Term Goal 4 - Progress (Week 1): Progressing toward goal Week 2:  PT Short Term Goal 1 (Week 2): Pt will complete bed mobility with supervision and no bedrails. PT Short Term Goal 2 (Week 2): Pt will complete stand pivot transfers with minA and LRAD. PT Short Term Goal 3 (Week 2): Pt will ambulate 25' with MinA and LRAD. PT Short Term Goal 4 (Week 2): Pt will improve initial BERG score by at least 8 points.  Skilled Therapeutic Interventions/Progress Updates:      Therapy Documentation Precautions:  Precautions Precautions: Fall Recall of Precautions/Restrictions: Intact Precaution/Restrictions Comments: L hemi Required Braces or Orthoses: Spinal Brace Spinal Brace: Other (comment) (LSO) Spinal Brace Comments: for comfort for L4 fx Restrictions Weight Bearing Restrictions Per Provider Order: No General:   Vital Signs: Therapy Vitals Temp: 98.4 F (36.9 C) Temp Source: Oral Pulse Rate: 76 Resp: 16 BP: (!) 108/56 Patient Position (if appropriate): Lying Oxygen  Therapy SpO2: 98 % O2 Device: Room Air Pain: Pain Assessment Pain Scale: Faces Faces Pain Scale: No hurt Pain Location: Generalized Mobility:   Locomotion :    Trunk/Postural Assessment :     Balance:   Exercises:   Other Treatments:      Therapy/Group: Individual Therapy  Mliss DELENA Milliner PT, DPT, CSRS 10/06/2024, 10:22 PM

## 2024-10-06 NOTE — Progress Notes (Signed)
 Occupational Therapy Session Note  Patient Details  Name: Kim Santiago MRN: [id number] Date of Birth: Aug 01, 1935  Today's Date: 10/06/2024 OT Individual Time: 8969-8884 OT Individual Time Calculation (min): 45 min    Short Term Goals: Week 3:  OT Short Term Goal 1 (Week 3): STG=LTG d/t ELOS  Skilled Therapeutic Interventions/Progress Updates:  Pt greeted supine in bed, pt agreeable to OT intervention.    No pain reported during session.   Transfers/bed mobility/functional mobility:  Pt completed supine>sit with CGA. Sit>stand from EOB with RW and CGA. Stand pivot to w/c with MIN A d/t L lean.   Therapeutic activity:  Pt completed dynamic standing balance task at BITS with pt instructd to reach out of BOS to tap stimulus on screen, emphasis on maintaining midline posture, pt did require MIN A for balance as pt fatigued d/t L lean. Pt able to stand for 4 mins and 13 secs. Graded task up and had pt alternate reaching BUEs on at a time to challenge balance. Pt completed task with MIN A d/t L lean with fatigue.   Pt completed standing reaching task with LUE with pt instructed to stand at BITS to tap stimulus on screen A-Z with LUE. 61% accuracy, pt able to stand for 1 min and 6 secs. MIN A- CGA for standing balance.   Pt completed seated Trail making task to work on LUE coordination and motor planning, pt completed task in 1 min 44 secs, 33 errors, 17 interruptions   Pt completed seated Tracing task with LUE from sitting. Pt completed task in   2 mins and 43 secs with 80% coverage.                   Ended session with pt seated in w/c with all needs within reach and  alarm activated.                    Therapy Documentation Precautions:  Precautions Precautions: Fall Recall of Precautions/Restrictions: Intact Precaution/Restrictions Comments: L hemi Required Braces or Orthoses: Spinal Brace Spinal Brace: Other (comment) (LSO) Spinal Brace Comments: for comfort for L4  fx Restrictions Weight Bearing Restrictions Per Provider Order: No  Pain: No pain reported during session    Therapy/Group: Individual Therapy  Ronal Mallie Needy 10/06/2024, 12:12 PM

## 2024-10-06 NOTE — Progress Notes (Signed)
 "                                                        PROGRESS NOTE   Subjective/Complaints:  No new complaints or concerns this morning.  Reports a little fatigued from doing a lot of therapy.  Reports slept well last night.  LBM today.  ROS:  Denies fevers, chills, vomiting , abdominal pain, SOB, chest pain, new weakness or paraesthesias.   +constipation-intermittent. + Chronic back pain-controlled + Mild fatigue Objective:   No results found.  Recent Labs    10/05/24 0505  WBC 2.8*  HGB 8.8*  HCT 25.5*  PLT 100*     Recent Labs    10/05/24 0505  NA 134*  K 4.3  CL 99  CO2 28  GLUCOSE 89  BUN 15  CREATININE 0.70  CALCIUM  8.6*     Intake/Output Summary (Last 24 hours) at 10/06/2024 1420 Last data filed at 10/06/2024 1200 Gross per 24 hour  Intake 828 ml  Output --  Net 828 ml        Physical Exam: Vital Signs Blood pressure (!) 105/46, pulse 68, temperature 97.7 F (36.5 C), temperature source Oral, resp. rate 18, height 5' 3 (1.6 m), weight 55.1 kg, SpO2 98%.   General: No acute distress.  Laying in bed appears comfortable Awake, alert, mood and affect are appropriate. Heart: Regular rate and rhythm no rubs murmurs or extra sounds Lungs: Clear to auscultation bilaterally, no increased work of breathing Abdomen: Soft, nontender, nondistended, positive bowel sounds Extremities: No clubbing, cyanosis, or edema Skin: No evidence of breakdown noted on visible portion  Neurologic:  Alert and awake Mildly dysarthric-- Cranial nerves II through XII intact, MMT- 5/5 RUE and RLE, 4/5 LUE and LLE  Fine motor finger to thumb mildly reduced on left side  Sensory exam normal sensation to light touch and proprioception in bilateral upper and lower extremities Cerebellar exam dysmetria left finger nose finger  Musculoskeletal: Full range of motion in all 4 extremities. No joint swelling   Prior neuro assessment is c/w today's exam  10/06/2024.   Assessment/Plan: 1. Functional deficits which require 3+ hours per day of interdisciplinary therapy in a comprehensive inpatient rehab setting. Physiatrist is providing close team supervision and 24 hour management of active medical problems listed below. Physiatrist and rehab team continue to assess barriers to discharge/monitor patient progress toward functional and medical goals  Care Tool:  Bathing    Body parts bathed by patient: Left arm, Chest, Abdomen, Front perineal area, Face   Body parts bathed by helper: Right arm, Buttocks, Right upper leg, Left upper leg, Left lower leg, Right lower leg     Bathing assist Assist Level: Maximal Assistance - Patient 24 - 49%     Upper Body Dressing/Undressing Upper body dressing   What is the patient wearing?: Pull over shirt, Bra    Upper body assist Assist Level: Minimal Assistance - Patient > 75%    Lower Body Dressing/Undressing Lower body dressing      What is the patient wearing?: Pants, Underwear/pull up     Lower body assist Assist for lower body dressing: Moderate Assistance - Patient 50 - 74%     Toileting Toileting    Toileting assist Assist for toileting: Maximal Assistance - Patient 25 - 49%  Transfers Chair/bed transfer  Transfers assist     Chair/bed transfer assist level: Maximal Assistance - Patient 25 - 49%     Locomotion Ambulation   Ambulation assist      Assist level: Moderate Assistance - Patient 50 - 74% Assistive device: Other (comment) (R hand rail) Max distance: 30'   Walk 10 feet activity   Assist     Assist level: Moderate Assistance - Patient - 50 - 74% Assistive device: Other (comment) (R hand rail)   Walk 50 feet activity   Assist    Assist level: Maximal Assistance - Patient 25 - 49% Assistive device: Walker-Eva    Walk 150 feet activity   Assist Walk 150 feet activity did not occur: Safety/medical concerns         Walk 10 feet on  uneven surface  activity   Assist Walk 10 feet on uneven surfaces activity did not occur: Safety/medical concerns         Wheelchair     Assist Is the patient using a wheelchair?: Yes Type of Wheelchair: Manual    Wheelchair assist level: Total Assistance - Patient < 25% Max wheelchair distance: 63'    Wheelchair 50 feet with 2 turns activity    Assist        Assist Level: Total Assistance - Patient < 25%   Wheelchair 150 feet activity     Assist      Assist Level: Total Assistance - Patient < 25%   Blood pressure (!) 105/46, pulse 68, temperature 97.7 F (36.5 C), temperature source Oral, resp. rate 18, height 5' 3 (1.6 m), weight 55.1 kg, SpO2 98%.  Medical Problem List and Plan: 1. Functional deficits secondary to right thalamic intraparenchymal hemorrhage             -patient may shower             -ELOS/Goals: 2/4 MinA             -Stable to continue CIR  - Expected discharge 10/11/2024   2.  Antithrombotics: -DVT/anticoagulation:  Pharmaceutical: Lovenox              -antiplatelet therapy: N/A due to thalamic hemorrhage 3. Pain Management: Tylenol  prn mild pain and oxycodone  prn severe pain             --will add K pad for local measures. Voltaren gel to heel  L4 comp fx acute as well as Lumbar spinal stenosis  4. Mood/Behavior/Sleep:  LCSW to follow for evaluation and support.             --Melatonin prn for insomnia             -antipsychotic agents: N/A    5. Neuropsych/cognition: This patient is capable of making decisions on her own behalf. 6. Skin/Wound Care: Routine pressure relief measures.              --TLSO d/c due to excessive bruising with use.              --  - Bruising stable  Still sore over bruised area but improved Trial desitin over peri area  7. Fluids/Electrolytes/Nutrition: Monitor I/O. Continue nutritional supplements 8. Hypotension: decrease cozaar  to 12.5mg  after supper   - 1-17: Poor p.o. intakes due to  abdominal pain/excessive bowel movements today.  BP 90s over 40s this evening.  Will give some IV fluids 500 cc at 125/h and hold Cozaar  1-18: BP remains soft, responded to fluid bolus, likely  losses from excessive bowel movements and poor p.o. intakes.  Start gentle IV fluids 50 cc/h today.  Vitals:   10/05/24 2042 10/06/24 0444  BP: (!) 113/57 (!) 105/46  Pulse: 73 68  Resp: 18 18  Temp: 98.2 F (36.8 C) 97.7 F (36.5 C)  SpO2: 100% 98%  BPs ok, Normal  orthostatic vitals -1/30 soft but stable continue to monitor    9.  Chronic hyponatremia: Continue to monitor for stability       Mild asymptomatic -1/29 sodium improved to 134 Recheck Monday      Latest Ref Rng & Units 10/05/2024    5:05 AM 10/02/2024    4:51 AM 09/28/2024    6:08 AM  BMP  Glucose 70 - 99 mg/dL 89  92  92   BUN 8 - 23 mg/dL 15  14  12    Creatinine 0.44 - 1.00 mg/dL 9.29  9.27  9.36   Sodium 135 - 145 mmol/L 134  133  134   Potassium 3.5 - 5.1 mmol/L 4.3  4.3  4.4   Chloride 98 - 111 mmol/L 99  99  101   CO2 22 - 32 mmol/L 28  26  25    Calcium  8.9 - 10.3 mg/dL 8.6  8.3  8.3     10. L4 Compression Fx: Has spinal stenosis with impingement of L4 and L5 nerves per imaging --Chronic L1 compression Fx s/p kyphoplasty.  LSO was used at [facility] but not being used at the hospital per PT daughter --Will order for support.  Using LSO instead of TLSO for comfort reasons -1/29-30  patient reports pain is under control continue Tylenol    11.  Chronic diastolic HF: Monitor for signs of overload. Check daily weighs.              --Used lasix  prn PTA   - Weight stable, no signs of volume overload 1-18  -1/25 no signs of volume overload noted Filed Weights   10/04/24 0500 10/05/24 0326 10/06/24 0444  Weight: 55.1 kg 55.1 kg 55.1 kg   Some increased weight but no edema or breathing issues   12. Hx of Sjogren syndrome/cutaneous Lupus:On low dose prednisone .  --will resume Methotrexate  per recommendations--every Sunday per  family --Eye drops qid with lacrilube at nights.    13. Pancytopenia due to  autoimmune disease : Baseline Hgb around 9             --Was transfused w/one unit on 12/23 and stable. Monitor for signs of bleeding             --no signs of bleeding , Hgb down 1 gm but may be hemodilution   -1/29 hemoglobin stable at 8.8. WBC 2.8 and platelets 100.  Continue to monitor  Recheck Monday      Latest Ref Rng & Units 10/05/2024    5:05 AM 10/02/2024    4:51 AM 09/28/2024    6:08 AM  CBC  WBC 4.0 - 10.5 K/uL 2.8  3.7  2.6   Hemoglobin 12.0 - 15.0 g/dL 8.8  8.5  8.5   Hematocrit 36.0 - 46.0 % 25.5  24.4  24.6   Platelets 150 - 400 K/uL 100  100  104     14.H/o Depression: continue Zoloft.    15. Hx of Left foot ankle pain: Calcaneal enthesopathy with tendinosis distal Achilles tendon.     16. Hypothyroid s/p thyroidectomy: continue supplement.     17 .  Constipation -   - 1-21  had a smear , on Miralax  every day, add senna S 1 po at bedtime   -1/24 sorbitol  30ml for constipation  -1/25 had a couple smaller bowel movements last night, will increase MiraLAX  to twice daily  -1/29 patient asked for second dose of MiraLAX  today, appears its already ordered twice a day.  It does look like she declined her a.m. dose yesterday but took the dose yesterday evening and this morning.  Will add sorbitol  as needed-discussed with patient and daughter.  -1/30 LBM today, improved continue to monitor  LOS: 16 days A FACE TO FACE EVALUATION WAS PERFORMED  Murray Collier 10/06/2024, 2:20 PM     "

## 2024-10-07 MED ORDER — ZINC OXIDE 40 % EX OINT: 1.0000 | TOPICAL_OINTMENT | Freq: Every day | CUTANEOUS | Status: DC | PRN

## 2024-10-07 NOTE — Progress Notes (Signed)
 Speech Language Pathology Daily Session Note  Patient Details  Name: Kim Santiago MRN: [id number] Date of Birth: 03-28-1935  Today's Date: 10/07/2024 SLP Individual Time: 1400-1446 SLP Individual Time Calculation (min): 46 min  Short Term Goals: Week 2: SLP Short Term Goal 1 (Week 2): Patient will demonstrate mildly complex problem solving skills in functional tasks given min multimodal a SLP Short Term Goal 2 (Week 2): Patient will recall and utilize memory compensatory strategies with supervision multimodal A SLP Short Term Goal 3 (Week 2): Patient will utilize compensatory strategies during consumption of least restrictive diet given supervison multimodal A  Skilled Therapeutic Interventions:  Patient was seen in am to address cognitive re- training. Pt was alert and seated upright in bed. Her dtr was present and participated intermittently. Pt reports feeling close to cognitive baseline. When asked about memory strategies, pt unable to recall components of WRAP compensatory strategies thus SLP reviewed strategies. In spite of poor recall for strategies by name, pt does endorse using strategies daily which has helped with memory. SLP challenging pt in a structured memory, following directions, and attention task involving recalling a prompt after a distracted following direction task which pt completed with min to mod A. At conclusion of session, pt was left in bed with call button within reach and bed alarm active. SLP to continue POC.   Pain Pain Assessment Pain Scale: 0-10 Pain Score: 0-No pain  Therapy/Group: Individual Therapy  Joane GORMAN Fuss 10/07/2024, 2:10 PM

## 2024-10-07 NOTE — Progress Notes (Signed)
 "                                                        PROGRESS NOTE   Subjective/Complaints: Feels bright and energized this morning as she slept well last night Her daughter requests more Desitin- ordered Hypotensive  ROS:  Denies fevers, chills, vomiting , abdominal pain, SOB, chest pain, new weakness or paraesthesias.   +constipation-intermittent. + Chronic back pain-controlled + Mild fatigue Objective:   No results found.  Recent Labs    10/05/24 0505  WBC 2.8*  HGB 8.8*  HCT 25.5*  PLT 100*     Recent Labs    10/05/24 0505  NA 134*  K 4.3  CL 99  CO2 28  GLUCOSE 89  BUN 15  CREATININE 0.70  CALCIUM  8.6*     Intake/Output Summary (Last 24 hours) at 10/07/2024 1340 Last data filed at 10/06/2024 2300 Gross per 24 hour  Intake 356 ml  Output --  Net 356 ml        Physical Exam: Vital Signs Blood pressure (!) 95/42, pulse 77, temperature 99 F (37.2 C), temperature source Oral, resp. rate 16, height 5' 3 (1.6 m), weight 55.4 kg, SpO2 98%.   General: No acute distress.  Daughter is helping to clean patient in bed Awake, alert, mood and affect are appropriate. Heart: Regular rate and rhythm no rubs murmurs or extra sounds Lungs: Clear to auscultation bilaterally, no increased work of breathing Abdomen: Soft, nontender, nondistended, positive bowel sounds Extremities: No clubbing, cyanosis, or edema Skin: No evidence of breakdown noted on visible portion  PRIOR EXAMS: Neurologic:  Alert and awake Mildly dysarthric-- Cranial nerves II through XII intact, MMT- 5/5 RUE and RLE, 4/5 LUE and LLE  Fine motor finger to thumb mildly reduced on left side  Sensory exam normal sensation to light touch and proprioception in bilateral upper and lower extremities Cerebellar exam dysmetria left finger nose finger  Musculoskeletal: Full range of motion in all 4 extremities. No joint swelling   Prior neuro assessment is c/w today's exam  10/07/2024.   Assessment/Plan: 1. Functional deficits which require 3+ hours per day of interdisciplinary therapy in a comprehensive inpatient rehab setting. Physiatrist is providing close team supervision and 24 hour management of active medical problems listed below. Physiatrist and rehab team continue to assess barriers to discharge/monitor patient progress toward functional and medical goals  Care Tool:  Bathing    Body parts bathed by patient: Left arm, Chest, Abdomen, Front perineal area, Face   Body parts bathed by helper: Right arm, Buttocks, Right upper leg, Left upper leg, Left lower leg, Right lower leg     Bathing assist Assist Level: Maximal Assistance - Patient 24 - 49%     Upper Body Dressing/Undressing Upper body dressing   What is the patient wearing?: Pull over shirt, Bra    Upper body assist Assist Level: Minimal Assistance - Patient > 75%    Lower Body Dressing/Undressing Lower body dressing      What is the patient wearing?: Pants, Underwear/pull up     Lower body assist Assist for lower body dressing: Moderate Assistance - Patient 50 - 74%     Toileting Toileting    Toileting assist Assist for toileting: Maximal Assistance - Patient 25 - 49%  Transfers Chair/bed transfer  Transfers assist     Chair/bed transfer assist level: Maximal Assistance - Patient 25 - 49%     Locomotion Ambulation   Ambulation assist      Assist level: Moderate Assistance - Patient 50 - 74% Assistive device: Other (comment) (R hand rail) Max distance: 30'   Walk 10 feet activity   Assist     Assist level: Moderate Assistance - Patient - 50 - 74% Assistive device: Other (comment) (R hand rail)   Walk 50 feet activity   Assist    Assist level: Maximal Assistance - Patient 25 - 49% Assistive device: Walker-Eva    Walk 150 feet activity   Assist Walk 150 feet activity did not occur: Safety/medical concerns         Walk 10 feet on  uneven surface  activity   Assist Walk 10 feet on uneven surfaces activity did not occur: Safety/medical concerns         Wheelchair     Assist Is the patient using a wheelchair?: Yes Type of Wheelchair: Manual    Wheelchair assist level: Total Assistance - Patient < 25% Max wheelchair distance: 77'    Wheelchair 50 feet with 2 turns activity    Assist        Assist Level: Total Assistance - Patient < 25%   Wheelchair 150 feet activity     Assist      Assist Level: Total Assistance - Patient < 25%   Blood pressure (!) 95/42, pulse 77, temperature 99 F (37.2 C), temperature source Oral, resp. rate 16, height 5' 3 (1.6 m), weight 55.4 kg, SpO2 98%.  Medical Problem List and Plan: 1. Functional deficits secondary to right thalamic intraparenchymal hemorrhage             -patient may shower             -ELOS/Goals: 2/4 MinA             -Stable to continue CIR  - Expected discharge 10/11/2024   2.  Antithrombotics: -DVT/anticoagulation:  Pharmaceutical: Lovenox              -antiplatelet therapy: N/A due to thalamic hemorrhage 3. Pain Management: Tylenol  prn mild pain and oxycodone  prn severe pain             --will add K pad for local measures. Voltaren gel to heel  L4 comp fx acute as well as Lumbar spinal stenosis  4. Mood/Behavior/Sleep:  LCSW to follow for evaluation and support.             --Melatonin prn for insomnia             -antipsychotic agents: N/A    5. Neuropsych/cognition: This patient is capable of making decisions on her own behalf. 6. Skin/Wound Care: Routine pressure relief measures.              --TLSO d/c due to excessive bruising with use.              --  - Bruising stable  Still sore over bruised area but improved Trial desitin over peri area  7. Fluids/Electrolytes/Nutrition: Monitor I/O. Continue nutritional supplements 8. Hypotension: decrease cozaar  to 12.5mg  after supper   - 1-17: Poor p.o. intakes due to abdominal  pain/excessive bowel movements today.  BP 90s over 40s this evening.  Will give some IV fluids 500 cc at 125/h and hold Cozaar  1-18: BP remains soft, responded to fluid bolus, likely  losses from excessive bowel movements and poor p.o. intakes.  Start gentle IV fluids 50 cc/h today.  Vitals:   10/07/24 0551 10/07/24 1325  BP: (!) 104/49 (!) 95/42  Pulse: 71 77  Resp: 16 16  Temp: 98.6 F (37 C) 99 F (37.2 C)  SpO2: 99% 98%  BPs ok, Normal  orthostatic vitals -1/30 soft but stable continue to monitor    9.  Chronic hyponatremia: Continue to monitor for stability       Mild asymptomatic -1/29 sodium improved to 134 Recheck Monday      Latest Ref Rng & Units 10/05/2024    5:05 AM 10/02/2024    4:51 AM 09/28/2024    6:08 AM  BMP  Glucose 70 - 99 mg/dL 89  92  92   BUN 8 - 23 mg/dL 15  14  12    Creatinine 0.44 - 1.00 mg/dL 9.29  9.27  9.36   Sodium 135 - 145 mmol/L 134  133  134   Potassium 3.5 - 5.1 mmol/L 4.3  4.3  4.4   Chloride 98 - 111 mmol/L 99  99  101   CO2 22 - 32 mmol/L 28  26  25    Calcium  8.9 - 10.3 mg/dL 8.6  8.3  8.3     10. L4 Compression Fx: Has spinal stenosis with impingement of L4 and L5 nerves per imaging --Chronic L1 compression Fx s/p kyphoplasty.  LSO was used at [facility] but not being used at the hospital per PT daughter --Will order for support.  Using LSO instead of TLSO for comfort reasons -1/29-30  patient reports pain is under control continue Tylenol    11.  Chronic diastolic HF: Monitor for signs of overload. Check daily weighs.              --Used lasix  prn PTA   Weighs reviewed and are stable Filed Weights   10/05/24 0326 10/06/24 0444 10/07/24 0500  Weight: 55.1 kg 55.1 kg 55.4 kg   Some increased weight but no edema or breathing issues   12. Hx of Sjogren syndrome/cutaneous Lupus:On low dose prednisone .  --will resume Methotrexate  per recommendations--every Sunday per family --Eye drops qid with lacrilube at nights.    13. Pancytopenia due to   autoimmune disease : Baseline Hgb around 9             --Was transfused w/one unit on 12/23 and stable. Monitor for signs of bleeding             --no signs of bleeding , Hgb down 1 gm but may be hemodilution   -1/29 hemoglobin stable at 8.8. WBC 2.8 and platelets 100.  Continue to monitor  Recheck Monday      Latest Ref Rng & Units 10/05/2024    5:05 AM 10/02/2024    4:51 AM 09/28/2024    6:08 AM  CBC  WBC 4.0 - 10.5 K/uL 2.8  3.7  2.6   Hemoglobin 12.0 - 15.0 g/dL 8.8  8.5  8.5   Hematocrit 36.0 - 46.0 % 25.5  24.4  24.6   Platelets 150 - 400 K/uL 100  100  104     14.H/o Depression: continue Zoloft.    15. Hx of Left foot ankle pain: Calcaneal enthesopathy with tendinosis distal Achilles tendon.     16. Hypothyroid s/p thyroidectomy: continue supplement.     17 .  Constipation -   - 1-21 had a smear , on Miralax  every day, add senna S 1  po at bedtime   -1/24 sorbitol  30ml for constipation  -1/25 had a couple smaller bowel movements last night, will increase MiraLAX  to twice daily  -continue sorbitol  prn  -1/30 LBM today, improved continue to monitor  LOS: 17 days A FACE TO FACE EVALUATION WAS PERFORMED  Kim Santiago 10/07/2024, 1:40 PM     "

## 2024-10-07 NOTE — Progress Notes (Signed)
 Slept good. PRN melatonin given at 2022. Daughter stays the night, providing hands on assistance. Taking meds with small bites of applesauce. Miralax  not given, previous miralax  in cup on table. Declined scheduled lacrilube, artificial tears and Anusol  HC.Earleen Aoun A

## 2024-10-08 ENCOUNTER — Inpatient Hospital Stay (HOSPITAL_COMMUNITY)
Admission: EM | Admit: 2024-10-08 | Discharge: 2024-10-11 | DRG: 282 | Disposition: A | Attending: Internal Medicine | Admitting: Internal Medicine

## 2024-10-08 ENCOUNTER — Encounter (HOSPITAL_COMMUNITY): Payer: Self-pay | Admitting: Internal Medicine

## 2024-10-08 ENCOUNTER — Inpatient Hospital Stay (HOSPITAL_COMMUNITY)

## 2024-10-08 ENCOUNTER — Encounter (HOSPITAL_COMMUNITY): Payer: Self-pay

## 2024-10-08 ENCOUNTER — Encounter (HOSPITAL_COMMUNITY): Payer: Self-pay | Admitting: Physical Medicine & Rehabilitation

## 2024-10-08 DIAGNOSIS — I214 Non-ST elevation (NSTEMI) myocardial infarction: Secondary | ICD-10-CM

## 2024-10-08 DIAGNOSIS — I10 Essential (primary) hypertension: Secondary | ICD-10-CM | POA: Diagnosis present

## 2024-10-08 DIAGNOSIS — M329 Systemic lupus erythematosus, unspecified: Secondary | ICD-10-CM

## 2024-10-08 DIAGNOSIS — I619 Nontraumatic intracerebral hemorrhage, unspecified: Secondary | ICD-10-CM | POA: Diagnosis present

## 2024-10-08 DIAGNOSIS — S32010A Wedge compression fracture of first lumbar vertebra, initial encounter for closed fracture: Secondary | ICD-10-CM | POA: Diagnosis not present

## 2024-10-08 DIAGNOSIS — S32000A Wedge compression fracture of unspecified lumbar vertebra, initial encounter for closed fracture: Secondary | ICD-10-CM | POA: Diagnosis present

## 2024-10-08 DIAGNOSIS — F411 Generalized anxiety disorder: Secondary | ICD-10-CM

## 2024-10-08 DIAGNOSIS — D696 Thrombocytopenia, unspecified: Secondary | ICD-10-CM | POA: Diagnosis present

## 2024-10-08 DIAGNOSIS — E038 Other specified hypothyroidism: Secondary | ICD-10-CM | POA: Diagnosis not present

## 2024-10-08 DIAGNOSIS — E039 Hypothyroidism, unspecified: Secondary | ICD-10-CM | POA: Diagnosis present

## 2024-10-08 DIAGNOSIS — M3500 Sicca syndrome, unspecified: Secondary | ICD-10-CM

## 2024-10-08 LAB — CBC WITH DIFFERENTIAL/PLATELET
Abs Immature Granulocytes: 0.1 10*3/uL — ABNORMAL HIGH (ref 0.00–0.07)
Basophils Absolute: 0.1 10*3/uL (ref 0.0–0.1)
Basophils Relative: 2 %
Eosinophils Absolute: 0.1 10*3/uL (ref 0.0–0.5)
Eosinophils Relative: 3 %
HCT: 26.9 % — ABNORMAL LOW (ref 36.0–46.0)
Hemoglobin: 9.2 g/dL — ABNORMAL LOW (ref 12.0–15.0)
Immature Granulocytes: 3 %
Lymphocytes Relative: 28 %
Lymphs Abs: 0.9 10*3/uL (ref 0.7–4.0)
MCH: 35.2 pg — ABNORMAL HIGH (ref 26.0–34.0)
MCHC: 34.2 g/dL (ref 30.0–36.0)
MCV: 103.1 fL — ABNORMAL HIGH (ref 80.0–100.0)
Monocytes Absolute: 0.3 10*3/uL (ref 0.1–1.0)
Monocytes Relative: 8 %
Neutro Abs: 1.9 10*3/uL (ref 1.7–7.7)
Neutrophils Relative %: 56 %
Platelets: 116 10*3/uL — ABNORMAL LOW (ref 150–400)
RBC: 2.61 MIL/uL — ABNORMAL LOW (ref 3.87–5.11)
RDW: 19.9 % — ABNORMAL HIGH (ref 11.5–15.5)
WBC: 3.3 10*3/uL — ABNORMAL LOW (ref 4.0–10.5)
nRBC: 0 % (ref 0.0–0.2)

## 2024-10-08 LAB — BASIC METABOLIC PANEL WITH GFR
Anion gap: 6 (ref 5–15)
BUN: 13 mg/dL (ref 8–23)
CO2: 29 mmol/L (ref 22–32)
Calcium: 8.8 mg/dL — ABNORMAL LOW (ref 8.9–10.3)
Chloride: 95 mmol/L — ABNORMAL LOW (ref 98–111)
Creatinine, Ser: 0.69 mg/dL (ref 0.44–1.00)
GFR, Estimated: >60 mL/min
Glucose, Bld: 102 mg/dL — ABNORMAL HIGH (ref 70–99)
Potassium: 4.7 mmol/L (ref 3.5–5.1)
Sodium: 131 mmol/L — ABNORMAL LOW (ref 135–145)

## 2024-10-08 LAB — TROPONIN T, HIGH SENSITIVITY
Troponin T High Sensitivity: 100 ng/L (ref 0–19)
Troponin T High Sensitivity: 689 ng/L (ref 0–19)
Troponin T High Sensitivity: 638 ng/L (ref 0–19)

## 2024-10-08 LAB — PRO BRAIN NATRIURETIC PEPTIDE: Pro Brain Natriuretic Peptide: 64.9 pg/mL

## 2024-10-08 MED ORDER — ACETAMINOPHEN 650 MG RE SUPP: 650.0000 mg | Freq: Four times a day (QID) | RECTAL | Status: DC | PRN

## 2024-10-08 MED ORDER — DIPHENHYDRAMINE HCL 50 MG/ML IJ SOLN: 25.0000 mg | Freq: Four times a day (QID) | INTRAMUSCULAR | Status: DC | PRN

## 2024-10-08 MED ORDER — METOPROLOL SUCCINATE ER 25 MG PO TB24: 25.0000 mg | ORAL_TABLET | Freq: Every day | ORAL | Status: DC

## 2024-10-08 MED ORDER — METHOTREXATE SODIUM 2.5 MG PO TABS: 20.0000 mg | ORAL_TABLET | ORAL | Status: DC

## 2024-10-08 MED ORDER — MECLIZINE HCL 25 MG PO TABS: 12.5000 mg | ORAL_TABLET | Freq: Three times a day (TID) | ORAL | Status: DC | PRN

## 2024-10-08 MED ORDER — ENOXAPARIN SODIUM 40 MG/0.4ML IJ SOSY: 40.0000 mg | PREFILLED_SYRINGE | INTRAMUSCULAR | Status: DC

## 2024-10-08 MED ORDER — FOLIC ACID 1 MG PO TABS
3.0000 mg | ORAL_TABLET | Freq: Every day | ORAL | Status: DC
  Administered 2024-10-09 – 2024-10-11 (×3): 3 mg via ORAL
  Filled 2024-10-08 (×3): qty 3

## 2024-10-08 MED ORDER — MORPHINE SULFATE (PF) 2 MG/ML IV SOLN: 1.0000 mg | INTRAVENOUS | Status: DC | PRN

## 2024-10-08 MED ORDER — OYSTER SHELL CALCIUM/D3 500-5 MG-MCG PO TABS
2.0000 | ORAL_TABLET | Freq: Every day | ORAL | Status: DC
  Administered 2024-10-09 – 2024-10-11 (×3): 2 via ORAL
  Filled 2024-10-08 (×3): qty 2

## 2024-10-08 MED ORDER — ASPIRIN 325 MG PO TABS
325.0000 mg | ORAL_TABLET | Freq: Every day | ORAL | Status: DC
  Administered 2024-10-08: 325 mg via ORAL
  Filled 2024-10-08: qty 1

## 2024-10-08 MED ORDER — ENOXAPARIN SODIUM 40 MG/0.4ML IJ SOSY
40.0000 mg | PREFILLED_SYRINGE | INTRAMUSCULAR | Status: DC
  Administered 2024-10-09 – 2024-10-10 (×2): 40 mg via SUBCUTANEOUS
  Filled 2024-10-08 (×2): qty 0.4

## 2024-10-08 MED ORDER — SODIUM CHLORIDE 0.9 % IV SOLN: 250.0000 mL | INTRAVENOUS | Status: DC | PRN

## 2024-10-08 MED ORDER — SODIUM CHLORIDE 0.9% FLUSH: 3.0000 mL | INTRAVENOUS | Status: DC | PRN

## 2024-10-08 MED ORDER — PREDNISONE 5 MG PO TABS
2.5000 mg | ORAL_TABLET | Freq: Every day | ORAL | Status: DC
  Administered 2024-10-09 – 2024-10-11 (×3): 2.5 mg via ORAL
  Filled 2024-10-08 (×3): qty 1

## 2024-10-08 MED ORDER — SERTRALINE HCL 50 MG PO TABS: 25.0000 mg | ORAL_TABLET | Freq: Every day | ORAL | Status: DC

## 2024-10-08 MED ORDER — LEVOTHYROXINE SODIUM 75 MCG PO TABS: 75.0000 ug | ORAL_TABLET | Freq: Every day | ORAL | Status: DC

## 2024-10-08 MED ORDER — ONDANSETRON HCL 4 MG PO TABS
4.0000 mg | ORAL_TABLET | Freq: Four times a day (QID) | ORAL | Status: DC | PRN
  Administered 2024-10-08 – 2024-10-11 (×3): 4 mg via ORAL
  Filled 2024-10-08 (×3): qty 1

## 2024-10-08 MED ORDER — SODIUM CHLORIDE 0.9% FLUSH: 3.0000 mL | Freq: Two times a day (BID) | INTRAVENOUS | Status: DC

## 2024-10-08 MED ORDER — MECLIZINE HCL 25 MG PO TABS
12.5000 mg | ORAL_TABLET | Freq: Three times a day (TID) | ORAL | Status: DC | PRN
  Administered 2024-10-10 – 2024-10-11 (×2): 12.5 mg via ORAL
  Filled 2024-10-08 (×2): qty 1

## 2024-10-08 MED ORDER — LEVOTHYROXINE SODIUM 75 MCG PO TABS
75.0000 ug | ORAL_TABLET | Freq: Every day | ORAL | Status: DC
  Administered 2024-10-09 – 2024-10-11 (×3): 75 ug via ORAL
  Filled 2024-10-08 (×3): qty 1

## 2024-10-08 MED ORDER — ONDANSETRON HCL 4 MG/2ML IJ SOLN
4.0000 mg | Freq: Four times a day (QID) | INTRAMUSCULAR | Status: DC | PRN
  Filled 2024-10-08: qty 2

## 2024-10-08 MED ORDER — ACETAMINOPHEN 325 MG PO TABS
650.0000 mg | ORAL_TABLET | Freq: Four times a day (QID) | ORAL | Status: DC | PRN
  Administered 2024-10-09 – 2024-10-11 (×3): 650 mg via ORAL
  Filled 2024-10-08 (×3): qty 2

## 2024-10-08 MED ORDER — ATORVASTATIN CALCIUM 10 MG PO TABS: 20.0000 mg | ORAL_TABLET | Freq: Every day | ORAL | Status: DC

## 2024-10-08 MED ORDER — ASPIRIN 325 MG PO TABS: 325.0000 mg | ORAL_TABLET | ORAL | Status: DC

## 2024-10-08 MED ORDER — PANTOPRAZOLE SODIUM 40 MG PO TBEC
40.0000 mg | DELAYED_RELEASE_TABLET | Freq: Every day | ORAL | Status: DC
  Administered 2024-10-09 – 2024-10-11 (×3): 40 mg via ORAL
  Filled 2024-10-08 (×3): qty 1

## 2024-10-08 MED ORDER — SODIUM CHLORIDE 0.9% FLUSH
3.0000 mL | Freq: Two times a day (BID) | INTRAVENOUS | Status: DC
  Administered 2024-10-08 – 2024-10-11 (×6): 3 mL via INTRAVENOUS

## 2024-10-08 MED ORDER — NITROGLYCERIN 0.4 MG SL SUBL: 0.4000 mg | SUBLINGUAL_TABLET | SUBLINGUAL | Status: DC | PRN

## 2024-10-08 MED ORDER — RISAQUAD PO CAPS
1.0000 | ORAL_CAPSULE | Freq: Every day | ORAL | Status: DC
  Administered 2024-10-09 – 2024-10-11 (×3): 1 via ORAL
  Filled 2024-10-08 (×3): qty 1

## 2024-10-08 MED ORDER — NITROGLYCERIN 0.4 MG SL SUBL
0.4000 mg | SUBLINGUAL_TABLET | SUBLINGUAL | Status: DC | PRN
  Administered 2024-10-08 (×3): 0.4 mg via SUBLINGUAL
  Filled 2024-10-08 (×2): qty 1

## 2024-10-08 MED ORDER — ONDANSETRON HCL 4 MG/2ML IJ SOLN: 4.0000 mg | Freq: Four times a day (QID) | INTRAMUSCULAR | Status: DC | PRN

## 2024-10-08 MED ORDER — MORPHINE SULFATE 15 MG PO TABS
15.0000 mg | ORAL_TABLET | Freq: Once | ORAL | Status: AC
  Administered 2024-10-08: 15 mg via ORAL
  Filled 2024-10-08: qty 1

## 2024-10-08 MED ORDER — ATORVASTATIN CALCIUM 40 MG PO TABS: 40.0000 mg | ORAL_TABLET | Freq: Every day | ORAL | Status: DC

## 2024-10-08 MED ORDER — SERTRALINE HCL 50 MG PO TABS
25.0000 mg | ORAL_TABLET | Freq: Every day | ORAL | Status: DC
  Administered 2024-10-09 – 2024-10-11 (×3): 25 mg via ORAL
  Filled 2024-10-08 (×3): qty 1

## 2024-10-08 MED ORDER — PANTOPRAZOLE SODIUM 40 MG PO TBEC
40.0000 mg | DELAYED_RELEASE_TABLET | Freq: Once | ORAL | Status: AC
  Administered 2024-10-08: 40 mg via ORAL
  Filled 2024-10-08: qty 1

## 2024-10-08 MED ORDER — ATORVASTATIN CALCIUM 40 MG PO TABS
40.0000 mg | ORAL_TABLET | Freq: Every day | ORAL | Status: DC
  Administered 2024-10-09 – 2024-10-11 (×3): 40 mg via ORAL
  Filled 2024-10-08 (×3): qty 1

## 2024-10-08 MED ORDER — ACETAMINOPHEN 325 MG PO TABS: 650.0000 mg | ORAL_TABLET | Freq: Four times a day (QID) | ORAL | Status: DC | PRN

## 2024-10-08 MED ORDER — PANTOPRAZOLE SODIUM 40 MG PO TBEC: 40.0000 mg | DELAYED_RELEASE_TABLET | Freq: Every day | ORAL | Status: DC

## 2024-10-08 MED ORDER — ASPIRIN 81 MG PO TBEC: 81.0000 mg | DELAYED_RELEASE_TABLET | Freq: Every day | ORAL | Status: DC

## 2024-10-08 MED ORDER — PROBIOTIC 250 MG PO CAPS: ORAL_CAPSULE | Freq: Every day | ORAL | Status: DC

## 2024-10-08 MED ORDER — ASPIRIN 81 MG PO TBEC
81.0000 mg | DELAYED_RELEASE_TABLET | Freq: Every day | ORAL | Status: DC
  Administered 2024-10-09 – 2024-10-11 (×3): 81 mg via ORAL
  Filled 2024-10-08 (×3): qty 1

## 2024-10-08 NOTE — Progress Notes (Addendum)
 "                                                        PROGRESS NOTE   Subjective/Complaints: No new complaints this morning Would like additional desitin Patient's chart reviewed- No issues reported overnight Vitals signs stable   ROS:  Denies fevers, chills, vomiting , abdominal pain, SOB, chest pain, new weakness or paraesthesias.   +constipation-intermittent. + Chronic back pain-controlled + Mild fatigue Objective:   No results found.  No results for input(s): WBC, HGB, HCT, PLT in the last 72 hours.    No results for input(s): NA, K, CL, CO2, GLUCOSE, BUN, CREATININE, CALCIUM  in the last 72 hours.    Intake/Output Summary (Last 24 hours) at 10/08/2024 1333 Last data filed at 10/07/2024 2219 Gross per 24 hour  Intake 297 ml  Output --  Net 297 ml        Physical Exam: Vital Signs Blood pressure 101/62, pulse 80, temperature 98.1 F (36.7 C), temperature source Oral, resp. rate 16, height 5' 3 (1.6 m), weight 55.2 kg, SpO2 96%.   General: No acute distress.  Daughter is helping to clean patient in bed Awake, alert, mood and affect are appropriate. Heart: Regular rate and rhythm no rubs murmurs or extra sounds Lungs: Clear to auscultation bilaterally, no increased work of breathing Abdomen: Soft, nontender, nondistended, positive bowel sounds Extremities: No clubbing, cyanosis, or edema Skin: No evidence of breakdown noted on visible portion, stable 2/1  PRIOR EXAMS: Neurologic:  Alert and awake Mildly dysarthric-- Cranial nerves II through XII intact, MMT- 5/5 RUE and RLE, 4/5 LUE and LLE  Fine motor finger to thumb mildly reduced on left side  Sensory exam normal sensation to light touch and proprioception in bilateral upper and lower extremities Cerebellar exam dysmetria left finger nose finger  Musculoskeletal: Full range of motion in all 4 extremities. No joint swelling   Prior neuro assessment is c/w today's exam  10/08/2024.   Assessment/Plan: 1. Functional deficits which require 3+ hours per day of interdisciplinary therapy in a comprehensive inpatient rehab setting. Physiatrist is providing close team supervision and 24 hour management of active medical problems listed below. Physiatrist and rehab team continue to assess barriers to discharge/monitor patient progress toward functional and medical goals  Care Tool:  Bathing    Body parts bathed by patient: Left arm, Chest, Abdomen, Front perineal area, Face   Body parts bathed by helper: Right arm, Buttocks, Right upper leg, Left upper leg, Left lower leg, Right lower leg     Bathing assist Assist Level: Maximal Assistance - Patient 24 - 49%     Upper Body Dressing/Undressing Upper body dressing   What is the patient wearing?: Pull over shirt, Bra    Upper body assist Assist Level: Minimal Assistance - Patient > 75%    Lower Body Dressing/Undressing Lower body dressing      What is the patient wearing?: Pants, Underwear/pull up     Lower body assist Assist for lower body dressing: Moderate Assistance - Patient 50 - 74%     Toileting Toileting    Toileting assist Assist for toileting: Maximal Assistance - Patient 25 - 49%     Transfers Chair/bed transfer  Transfers assist     Chair/bed transfer assist level: Maximal Assistance - Patient 25 -  49%     Locomotion Ambulation   Ambulation assist      Assist level: Moderate Assistance - Patient 50 - 74% Assistive device: Other (comment) (R hand rail) Max distance: 30'   Walk 10 feet activity   Assist     Assist level: Moderate Assistance - Patient - 50 - 74% Assistive device: Other (comment) (R hand rail)   Walk 50 feet activity   Assist    Assist level: Maximal Assistance - Patient 25 - 49% Assistive device: Walker-Eva    Walk 150 feet activity   Assist Walk 150 feet activity did not occur: Safety/medical concerns         Walk 10 feet on  uneven surface  activity   Assist Walk 10 feet on uneven surfaces activity did not occur: Safety/medical concerns         Wheelchair     Assist Is the patient using a wheelchair?: Yes Type of Wheelchair: Manual    Wheelchair assist level: Total Assistance - Patient < 25% Max wheelchair distance: 44'    Wheelchair 50 feet with 2 turns activity    Assist        Assist Level: Total Assistance - Patient < 25%   Wheelchair 150 feet activity     Assist      Assist Level: Total Assistance - Patient < 25%   Blood pressure 101/62, pulse 80, temperature 98.1 F (36.7 C), temperature source Oral, resp. rate 16, height 5' 3 (1.6 m), weight 55.2 kg, SpO2 96%.  Medical Problem List and Plan: 1. Functional deficits secondary to right thalamic intraparenchymal hemorrhage             -patient may shower             -ELOS/Goals: 2/4 MinA             D/c to acute- see separate note for details   2.  Antithrombotics: -DVT/anticoagulation:  Pharmaceutical: Lovenox              -antiplatelet therapy: N/A due to thalamic hemorrhage 3. Pain Management: Tylenol  prn mild pain and oxycodone  prn severe pain             --will add K pad for local measures. Voltaren gel to heel  L4 comp fx acute as well as Lumbar spinal stenosis  4. Mood/Behavior/Sleep:  LCSW to follow for evaluation and support.             --Melatonin prn for insomnia             -antipsychotic agents: N/A    5. Neuropsych/cognition: This patient is capable of making decisions on her own behalf. 6. Skin/Wound Care: Routine pressure relief measures.              --TLSO d/c due to excessive bruising with use.              --  - Bruising stable  Still sore over bruised area but improved Trial desitin over peri area  7. Fluids/Electrolytes/Nutrition: Monitor I/O. Continue nutritional supplements 8. Hypotension: decrease cozaar  to 12.5mg  after supper   - 1-17: Poor p.o. intakes due to abdominal  pain/excessive bowel movements today.  BP 90s over 40s this evening.  Will give some IV fluids 500 cc at 125/h and hold Cozaar  1-18: BP remains soft, responded to fluid bolus, likely losses from excessive bowel movements and poor p.o. intakes.  Start gentle IV fluids 50 cc/h today.  Vitals:   10/07/24  2011 10/08/24 0543  BP: (!) 103/53 101/62  Pulse: 71 80  Resp: 16 16  Temp: 98.1 F (36.7 C) 98.1 F (36.7 C)  SpO2: 100% 96%  BPs ok, Normal  orthostatic vitals -1/30 soft but stable continue to monitor    9.  Chronic hyponatremia: Continue to monitor for stability       Mild asymptomatic -1/29 sodium improved to 134 Recheck Monday      Latest Ref Rng & Units 10/05/2024    5:05 AM 10/02/2024    4:51 AM 09/28/2024    6:08 AM  BMP  Glucose 70 - 99 mg/dL 89  92  92   BUN 8 - 23 mg/dL 15  14  12    Creatinine 0.44 - 1.00 mg/dL 9.29  9.27  9.36   Sodium 135 - 145 mmol/L 134  133  134   Potassium 3.5 - 5.1 mmol/L 4.3  4.3  4.4   Chloride 98 - 111 mmol/L 99  99  101   CO2 22 - 32 mmol/L 28  26  25    Calcium  8.9 - 10.3 mg/dL 8.6  8.3  8.3     10. L4 Compression Fx: Has spinal stenosis with impingement of L4 and L5 nerves per imaging --Chronic L1 compression Fx s/p kyphoplasty.  LSO was used at [facility] but not being used at the hospital per PT daughter --Will order for support.  Using LSO instead of TLSO for comfort reasons -1/29-30  patient reports pain is under control continue Tylenol    11.  Chronic diastolic HF: Monitor for signs of overload. Check daily weighs.              --Used lasix  prn PTA   Weighs reviewed and are stable Filed Weights   10/06/24 0444 10/07/24 0500 10/08/24 0509  Weight: 55.1 kg 55.4 kg 55.2 kg   Some increased weight but no edema or breathing issues   12. Hx of Sjogren syndrome/cutaneous Lupus: Continue low dose prednisone .  --will resume Methotrexate  per recommendations--every Sunday per family --Eye drops qid with lacrilube at nights.    13.  Pancytopenia due to  autoimmune disease : Baseline Hgb around 9             --Was transfused w/one unit on 12/23 and stable. Monitor for signs of bleeding             --no signs of bleeding , Hgb down 1 gm but may be hemodilution   -1/29 hemoglobin stable at 8.8. WBC 2.8 and platelets 100.  Continue to monitor  Recheck Monday      Latest Ref Rng & Units 10/05/2024    5:05 AM 10/02/2024    4:51 AM 09/28/2024    6:08 AM  CBC  WBC 4.0 - 10.5 K/uL 2.8  3.7  2.6   Hemoglobin 12.0 - 15.0 g/dL 8.8  8.5  8.5   Hematocrit 36.0 - 46.0 % 25.5  24.4  24.6   Platelets 150 - 400 K/uL 100  100  104     14.H/o Depression: continue Zoloft.    15. Hx of Left foot ankle pain: Calcaneal enthesopathy with tendinosis distal Achilles tendon.     16. Hypothyroid s/p thyroidectomy: continue supplement.     17 .  Constipation -   - 1-21 had a smear , on Miralax  every day, add senna S 1 po at bedtime   -1/24 sorbitol  30ml for constipation  -1/25 had a couple smaller bowel movements last night, will increase  MiraLAX  to twice daily  -continue sorbitol  prn  -1/30 LBM today, improved continue to monitor   >30 minutes spent in discharge of patient including review of medications and follow-up appointments, physical examination, and in answering all patient's questions  LOS: 18 days A FACE TO FACE EVALUATION WAS PERFORMED  Sven P Faye Strohman 10/08/2024, 1:33 PM     "

## 2024-10-08 NOTE — H&P (Deleted)
 " History and Physical    Kim Santiago FMW:[id number] DOB: 02/26/1935 DOA: 09/20/2024  PCP: Marylynn Verneita CROME, MD   Patient coming from: Home   Chief Complaint: Chest pain   HPI:  Kim Santiago is a 89 y.o. female with medical history significant of medical history of recent thalamic hemorrhagic stroke diagnosed in 1/8 initially patient was on comfort care however family revoked and transition to DNR limited however at that point patient's family did not wanted aggressive intervention except continue conservative management patient eventually discharged to inpatient rehab 1/14, essential hypertension, Sjogren syndrome, history of lupus, hypothyroidism, dyslipidemia, lumbar compression fracture who is currently at inpatient rehab.  Hospitalist has been called by request of Dr. Vicky for evaluation for chest pain.  Rapid response called around 6 PM as patient was complaining about chest pain 8 out of 10 on the left side and into the back describing pressure-like sensation.  Patient denies shortness of breath palpitation and flutter.  Patient reported episode began when she was moving from chair to the bed and vomiting began.  At presentation patient is hemodynamically stable.  Patient was given sublingual nitroglycerin , morphine  and afterwards chest pain improved eventually resolved.   EKG showing normal sinus rhythm with first-degree AV block occasional premature ventricular complex.  Nonspecific ST depression in lead 4 5 and in V6.  Initial troponin is 100.  Pending second troponin level.  Pending CBC, BMP and proBNP.  Rehab physician Dr. Lorilee has been spoke with on-call neurology Dr. Vanessa and per neurology it is absolutely contraindicated to start patient on heparin drip in the setting of hemorrhagic stroke however it is safe to continue the aspirin . On-call cardiology Dr. Lea has been consulted as well pending recommendation.  Patient received aspirin  load 325 mg.   Transferring patient to inpatient progressive unit for further care and management. Hospitalist consulted for further evaluation management of nSTEMI.  During my evaluation at the bedside patient is resting comfortably in the bed.  Currently she denies any chest pain or chest pressure.  Denies any palpitation, shortness of breath, nausea, vomiting and abdominal pain.  Patient's daughter Bari at the bedside has been updated.  Discussed regarding the difficult situation as in the setting of recent thalamic hemorrhagic stroke it is absolute contraindication to treat patient with IV heparin drip or dual antiplatelet therapy as it will increase the risk of progression of the thalamic hemorrhage.  Patient's daughter reported that they are not looking forward any aggressive management neither any heart cath at this time except want to continue conservative management.  Daughter is agreeable to treat with aspirin  only.  Patient's family is very supportive and understanding.   Significant labs in the ED: Lab Orders         Comprehensive metabolic panel         CBC with Differential/Platelet         Basic metabolic panel         Pro Brain natriuretic peptide         Basic metabolic panel         CBC with Differential/Platelet         Comprehensive metabolic panel         CBC       Review of Systems:  Review of Systems  Constitutional:  Negative for chills, diaphoresis, fever, malaise/fatigue and weight loss.  Respiratory:  Negative for cough, sputum production and shortness of breath.   Cardiovascular:  Positive for chest pain. Negative for  palpitations, orthopnea, claudication, leg swelling and PND.  Gastrointestinal:  Negative for heartburn, nausea and vomiting.  Musculoskeletal:  Negative for myalgias.  Skin:  Positive for rash. Negative for itching.       Purpuric rash and bruising of the upper chest area.  Neurological:  Negative for dizziness and headaches.  Endo/Heme/Allergies:   Bruises/bleeds easily.  Psychiatric/Behavioral:  The patient is not nervous/anxious.     Past Medical History:  Diagnosis Date   Arthritis    Breast cancer (HCC) 2017   left mastectomy done 11/2015   Breast cancer in female [facility]) 11/18/2015   Left: 3.9 cm tumor, T2, 1/2 sentinel nodes positive for macro metastatic disease, N1, 3 negative nodes in the axillary tail, ER+,PR+, Her 2 neu, low Mammoprint score   Cancer (HCC)    thyroid  takes levothyroxine    CAP (community acquired pneumonia) 05/05/2021   Chronic kidney disease    UTI   Genetic screening 11/2015   Mammoprint of left breast cancer: Low risk for recurrence.    History of heart attack 06/12/2014   Hypertension    hypothyroidism    secondary to thyroidectomy for thyroid  ca   Hypothyroidism    Lupus    subcutaneous   Menopause 40s   natural, hot flashes and mood lability now gone, off prempro 7 months   Myocardial infarction [facility]) 2013   Osteoporosis    Osteopenia   Rosacea    Sjoegren syndrome    Stress-induced cardiomyopathy September of 2013   EF 35%. Peak troponin was 1.8.   Stroke [facility]) 10/2018    Past Surgical History:  Procedure Laterality Date   BACK SURGERY     BREAST BIOPSY Left 10/30/15   positive, done in Dr. Fredirick office   CARDIAC CATHETERIZATION  05/2012   [facility]. No significant CAD. Ejection fraction of 35% due to stress-induced cardiomyopathy.   CHOLECYSTECTOMY     COLONOSCOPY     DILATION AND CURETTAGE OF UTERUS     KYPHOSIS SURGERY  Feb 2008   L1, Dr. Kathi   LUMBAR DISC SURGERY     L4-L5   MASTECTOMY Left 11/18/2015   positive   SENTINEL NODE BIOPSY Left 11/18/2015   Procedure: SENTINEL NODE BIOPSY;  Surgeon: Reyes LELON Cota, MD;  Location: [facility] ORS;  Service: General;  Laterality: Left;   SHOULDER ARTHROSCOPY  2004   Left, Dr. Maryl   SIMPLE MASTECTOMY WITH AXILLARY SENTINEL NODE BIOPSY Left 11/18/2015   Procedure: SIMPLE MASTECTOMY;  Surgeon: Reyes LELON Cota, MD;  Location:  [facility] ORS;  Service: General;  Laterality: Left;   SPINE SURGERY     L4-5 diskectomy   THYROIDECTOMY     Thyroid  Cancer   TONSILLECTOMY     TUBAL LIGATION       reports that she has never smoked. She has never used smokeless tobacco. She reports that she does not drink alcohol  and does not use drugs.  Allergies[1]  Family History  Problem Relation Age of Onset   Kidney disease Mother    Heart disease Mother    COPD Father    Cancer Father        esophageal   Kidney disease Sister    Cancer Brother 86       colon cancer (both brothers)   Cancer Brother    Heart attack Brother 15   Heart disease Brother    Breast cancer Neg Hx     Prior to Admission medications  Medication Sig Start Date End Date Taking?  Authorizing Provider  acetaminophen  (TYLENOL ) 500 MG tablet Take 500 mg by mouth every 4 (four) hours as needed for mild pain (pain score 1-3) or moderate pain (pain score 4-6).   Yes [provider]  atorvastatin  (LIPITOR) 20 MG tablet TAKE 1 TABLET BY MOUTH EVERY DAY AT 6 PM 04/10/24  Yes Marylynn Verneita CROME, MD  Calcium  Carbonate-Vitamin D  600-200 MG-UNIT TABS Take 2 tablets by mouth daily.   Yes [provider]  CRANBERRY PO Take 1 capsule by mouth 2 (two) times daily.   Yes [provider]  folic acid  (FOLVITE ) 1 MG tablet Take 3 mg by mouth daily. 08/13/21  Yes [provider]  furosemide  (LASIX ) 20 MG tablet TAKE 1 TABLET BY MOUTH EVERY OTHER DAY AS NEEDED Patient taking differently: Take 20 mg by mouth See admin instructions. Take 20 mg by mouth every other day as needed for fluid. 08/14/24  Yes Marylynn Verneita CROME, MD  losartan  (COZAAR ) 25 MG tablet Take 25 mg by mouth daily.   Yes [provider]  methotrexate  (RHEUMATREX) 2.5 MG tablet Take 20 mg by mouth once a week. Caution:Chemotherapy. Protect from light.   Yes [provider]  metoprolol  succinate (TOPROL -XL) 25 MG 24 hr tablet Take 25 mg by mouth daily.   Yes [provider]  ondansetron  (ZOFRAN ) 4 MG tablet Take 4 mg by mouth every 6 (six) hours as needed for nausea or vomiting.   Yes [provider]  polyethylene glycol (MIRALAX  / GLYCOLAX ) packet Take 17 g by mouth daily.   Yes [provider]  predniSONE  (DELTASONE ) 2.5 MG tablet Take 2.5 mg by mouth daily. 08/14/24  Yes [provider]  Probiotic Product (PROBIOTIC PO) Take 1 tablet by mouth daily.   Yes [provider]  acetaminophen  (TYLENOL ) 325 MG tablet Take 2 tablets (650 mg total) by mouth every 6 (six) hours as needed for fever (Fever >/= 101). 09/20/24   Josette Ade, MD  artificial tears (LACRILUBE) OINT ophthalmic ointment Place into both eyes at bedtime. 09/25/24   Love, Sharlet RAMAN, PA-C  artificial tears ophthalmic solution Place 1 drop into both eyes 4 (four) times daily as needed for dry eyes. 09/20/24   Josette Ade, MD  feeding supplement (ENSURE PLUS HIGH PROTEIN) LIQD Take 237 mLs by mouth 2 (two) times daily between meals. 09/20/24   Josette Ade, MD  fluticasone  (FLONASE ) 50 MCG/ACT nasal spray Place 1 spray into both nostrils daily. Patient not taking: Reported on 09/20/2024    [provider]  levothyroxine  (SYNTHROID ) 75 MCG tablet TAKE 1 TABLET BY MOUTH ( TOTAL) DAILY 10/04/24   Marylynn Verneita CROME, MD  meclizine  (ANTIVERT ) 12.5 MG tablet Take 1 tablet (12.5 mg total) by mouth 3 (three) times daily as needed for dizziness or nausea. 09/28/24   Love, Sharlet RAMAN, PA-C  omeprazole  (PRILOSEC) 20 MG capsule TAKE 1 CAPSULE (20 MG TOTAL) BY MOUTH 2 (TWO) TIMES DAILY BEFORE A MEAL. 10/04/24   Marylynn Verneita CROME, MD  oxyCODONE  (OXY IR/ROXICODONE ) 5 MG immediate release tablet Take 1 tablet (5 mg total) by mouth every 8 (eight) hours as needed for moderate pain (pain score 4-6). Patient not taking: Reported on 09/20/2024 09/20/24   Josette Ade, MD  sertraline  (ZOLOFT ) 25 MG tablet TAKE 1 TABLET (25 MG TOTAL) BY MOUTH DAILY. 09/22/24   Marylynn Verneita CROME, MD  Sodium Fluoride 1.1 % PSTE Place 1 Application onto teeth at bedtime. Patient not taking: Reported on 09/20/2024  [provider]  triamcinolone  cream (KENALOG ) 0.1 % Apply 1 Application topically 2 (two) times daily. Patient not taking: Reported on 09/20/2024    [provider]  witch hazel-glycerin  (TUCKS) pad Apply topically as needed for itching. 09/28/24   Maurice Sharlet GORMAN DEVONNA     Physical Exam: Vitals:   10/08/24 1715 10/08/24 1735 10/08/24 1908 10/08/24 1937  BP: 116/68 (!) 115/56 129/60 127/70  Pulse: 80 76 75 82  Resp:    16  Temp:    98.6 F (37 C)  TempSrc:    Oral  SpO2:  100% 100% 100%  Weight:      Height:        Physical Exam Nursing note reviewed.  HENT:     Mouth/Throat:     Mouth: Mucous membranes are moist.  Eyes:     Pupils: Pupils are equal, round, and reactive to light.  Cardiovascular:     Rate and Rhythm: Normal rate and regular rhythm.     Heart sounds: Normal heart sounds.  Pulmonary:     Effort: Pulmonary effort is normal.     Breath sounds: Normal breath sounds.  Abdominal:     Palpations: Abdomen is soft.  Skin:    Capillary Refill: Capillary refill takes less than 2 seconds.     Findings: Bruising, erythema and rash present. No lesion.  Neurological:     Mental Status: She is alert and oriented to person, place, and time.  Psychiatric:        Mood and Affect: Mood normal.      Labs on Admission: I have personally reviewed following labs and imaging studies  CBC: Recent Labs  Lab 10/02/24 0451 10/05/24 0505 10/08/24 2023  WBC 3.7* 2.8* 3.3*  NEUTROABS 2.3 1.1* 1.9  HGB 8.5* 8.8* 9.2*  HCT 24.4* 25.5* 26.9*  MCV 101.2* 101.6* 103.1*  PLT 100* 100* 116*   Basic Metabolic Panel: Recent Labs  Lab 10/02/24 0451 10/05/24 0505 10/08/24 2023  NA 133* 134* 131*  K 4.3 4.3 4.7  CL 99 99 95*  CO2 26 28 29   GLUCOSE 92 89 102*  BUN 14 15 13   CREATININE 0.72 0.70 0.69  CALCIUM  8.3* 8.6* 8.8*    GFR: Estimated Creatinine Clearance: 41 mL/min (by C-G formula based on SCr of 0.69 mg/dL). Liver Function Tests: No results for input(s): AST, ALT, ALKPHOS, BILITOT, PROT, ALBUMIN in the last 168 hours. No results for input(s): LIPASE, AMYLASE in the last 168 hours. No results for input(s): AMMONIA in the last 168 hours. Coagulation Profile: No results for input(s): INR, PROTIME in the last 168 hours. Cardiac Enzymes: No results for input(s): CKTOTAL, CKMB, CKMBINDEX, TROPONINI, TROPONINIHS in the last 168 hours. BNP (last 3 results) No results for input(s): BNP in the last 8760 hours. HbA1C: No results for input(s): HGBA1C in the last 72 hours. CBG: No results for input(s): GLUCAP in the last 168 hours. Lipid Profile: No results for input(s): CHOL, HDL, LDLCALC, TRIG, CHOLHDL, LDLDIRECT in the last 72 hours. Thyroid  Function Tests: No results for input(s): TSH, T4TOTAL, FREET4, T3FREE, THYROIDAB in the last 72 hours. Anemia Panel: No results for input(s): VITAMINB12, FOLATE, FERRITIN, TIBC, IRON , RETICCTPCT in the last 72 hours. Urine analysis:    Component Value Date/Time   COLORURINE YELLOW (A) 09/06/2024 0621   APPEARANCEUR HAZY (A) 09/06/2024 0621   APPEARANCEUR Slightly cloudy 05/11/2024 1108   LABSPEC 1.012 09/06/2024 0621   PHURINE 7.0 09/06/2024 0621   GLUCOSEU NEGATIVE 09/06/2024 9378  GLUCOSEU NEGATIVE 05/11/2017 1143   HGBUR NEGATIVE 09/06/2024 0621   BILIRUBINUR NEGATIVE 09/06/2024 0621   BILIRUBINUR Negative 05/11/2024 1108   KETONESUR NEGATIVE 09/06/2024 0621   PROTEINUR NEGATIVE 09/06/2024 0621   UROBILINOGEN 0.2 07/04/2018 1134   UROBILINOGEN 0.2 05/11/2017 1143   NITRITE NEGATIVE 09/06/2024 0621   LEUKOCYTESUR SMALL (A) 09/06/2024 0621    Radiological Exams on Admission: I have personally reviewed images DG CHEST PORT 1 VIEW Result Date: 10/08/2024 CLINICAL DATA:  Shortness of  breath, myocardial infarction EXAM: PORTABLE CHEST 1 VIEW COMPARISON:  08/28/2024 FINDINGS: Single frontal view of the chest demonstrates a stable cardiac silhouette. Chronic eventration of the right hemidiaphragm. No acute airspace disease, effusion, or pneumothorax. No acute bony abnormality. IMPRESSION: 1. No acute intrathoracic process. Electronically Signed   By: Ozell Daring M.D.   On: 10/08/2024 20:24     Assessment/Plan: Principal Problem:   NSTEMI (non-ST elevated myocardial infarction) [facility]) Active Problems:   Hemorrhagic stroke [facility]   Essential hypertension   History of Sjogren disease   Hypothyroidism   Lupus (systemic lupus erythematosus) (HCC)   Lumbar compression fracture (HCC)   Thalamic hemorrhage (HCC)    Assessment and Plan: NSTEMI -While patient on inpatient rehab from recent hospital discharge from acute thalamic hemorrhagic stroke admitted for from 1/8 to 1/14 around 6 PM today patient developed acute onset of substernal chest pain and pressure 8/10 while patient was moving from bed to her chair.  Chest pain radiated to the left arm and shooting pain.  Pain eventually subsided with morphine  and sublingual nitroglycerin . -EKG showed normal sinus rhythm heart rate 83, first-degree AV block and ST depression in lead V5 to V6. -Initial troponin 100 trended up to 689. -Discussed with on-call neurology Dr. Chaim;  given patient has recent acute thalamic hemorrhagic stroke IV heparin and dual antiplatelets contraindicated.  However single antiplatelet therapy and pharmacological DVT prophylaxis is safe to continue. -Patient received aspirin  load and currently on IV Lovenox  as pharmacological DVT prophylaxis. - Patient received aspirin  load 325 milligram.  Followed by continue aspirin  81 mg daily.  Continue Lipitor 20 mg daily - Continue to trend troponin. -Obtain echocardiogram to assess any wall motion abnormality. -Family has been updated. -Of note, when patient  found to have acute thalamic hemorrhagic stroke initially patient was on comfort care afterward family reverted to DNR status and did not wanted any aggressive intervention. -Continue sublingual nitroglycerin , morphine  as needed.   -Patient's daughter has been updated by Dr.Raulkar about chest pain and new diagnosis of NSTEMI. -Both cardiology and neurology on board appreciate input.   Recent acute thalamic hemorrhagic stroke 1/8 - Patient found to have acute right thalamic hemorrhagic stroke with mild edema with midline shift on recent imaging.  And repeat head CT scan showed stable and there is no new evidence of bleeding or worsening edema.  Family decided to stay conservative and does not wanted to pursue further stroke workup.  In the setting of hemorrhagic stroke not a candidate for antiplatelet or blood thinner at that time.  Patient was discharged to inpatient rehab after PT OT evaluation. -Continue PT and OT evaluation as patient tolerates  Essential hypertension - Continue Toprol -XL.  Continue to hold losartan  as blood pressure borderline soft.   History of Sjogren syndrome History of lupus -Continue methotrexate  25 mg weekly-every Sunday and prednisone  2.5 mg daily  Hypothyroidism -Continue levothyroxine   Chronic thrombocytopenia - Platelet count 116 which is around baseline.  Continue to monitor.  Chronic macrocytic anemia -  Stable H&H 9.2 and 26.  MCV 103.  Continue to monitor CBC.   Leukopenia - Low WBC count 3.3.  WBC count was around 813 days ago.  Continue to monitor.   History of lumbar compression fracture -Patient did not tolerate back brace placement.   -Continue supportive care.  DVT prophylaxis:  Lovenox  Code Status:  DNR/DNI(Do NOT Intubate) Diet: Heart healthy diet Family Communication: Patient's daughter Bari at the bedside discussed all acute conditions including management and oppportunity was given to ask question and all questions were answered  satisfactorily.  Disposition Plan: Continue trend troponin and follow-up with echocardiogram. Consults: Cardiology and neurology Admission status:   Inpatient, Step Down Unit  Severity of Illness: The appropriate patient status for this patient is INPATIENT. Inpatient status is judged to be reasonable and necessary in order to provide the required intensity of service to ensure the patient's safety. The patient's presenting symptoms, physical exam findings, and initial radiographic and laboratory data in the context of their chronic comorbidities is felt to place them at high risk for further clinical deterioration. Furthermore, it is not anticipated that the patient will be medically stable for discharge from the hospital within 2 midnights of admission.   * I certify that at the point of admission it is my clinical judgment that the patient will require inpatient hospital care spanning beyond 2 midnights from the point of admission due to high intensity of service, high risk for further deterioration and high frequency of surveillance required.DEWAINE    Glee Lashomb, MD [facility]  *Please note that this is a verbal dictation therefore any spelling or grammatical errors are due to the Dragon [employer] One system interpretation.   Please page via Amion and do not message via secure chat for urgent patient care matters. Secure chat can be used for non urgent patient care matters.  How to contact the [facility] Attending or Consulting provider 7A - 7P or covering provider during after hours 7P -7A, for this patient.  Check the care team in [facility] and look for a) attending/consulting [facility] provider listed and b) the [facility] team listed Log into [email or web address] and use [place]'s universal password to access. If you do not have the password, please contact the hospital operator. Locate the [facility] provider you are looking for under [facility] and page to a number that you can be directly reached. If  you still have difficulty reaching the provider, please page the [facility] (Director on Call) for the Hospitalists listed on amion for assistance.  10/08/2024, 9:02 PM              [1]  Allergies Allergen Reactions   Naprosyn [Naproxen] Swelling   Amoxicillin  Rash    Tolerates first and third generation cephalosporins   Azathioprine  Nausea And Vomiting    Severe vomiting   Codeine Nausea And Vomiting   Hydroxychloroquine Hives and Nausea And Vomiting   Mycophenolate Mofetil Nausea Only   Orudis [Ketoprofen] Hives   Sulfa Antibiotics Rash   Sulfathiazole Rash   "

## 2024-10-08 NOTE — H&P (Signed)
 " History and Physical    [name]:[id number] DOB: 01/21/1935 DOA: 10/08/2024  PCP: Marylynn Verneita CROME, MD   Patient coming from: Home   Chief Complaint: Chest pain   HPI:  Kim Santiago is a 89 y.o. female with medical history significant of medical history of recent thalamic hemorrhagic stroke diagnosed in 1/8 initially patient was on comfort care however family revoked and transition to DNR limited however at that point patient's family did not wanted aggressive intervention except continue conservative management patient eventually discharged to inpatient rehab 1/14, essential hypertension, Sjogren syndrome, history of lupus, hypothyroidism, dyslipidemia, lumbar compression fracture who is currently at inpatient rehab.  Hospitalist has been called by request of Dr. Vicky for evaluation for chest pain.  Rapid response called around 6 PM as patient was complaining about chest pain 8 out of 10 on the left side and into the back describing pressure-like sensation.  Patient denies shortness of breath palpitation and flutter.  Patient reported episode began when she was moving from chair to the bed and vomiting began.  At presentation patient is hemodynamically stable.  Patient was given sublingual nitroglycerin , morphine  and afterwards chest pain improved eventually resolved.   EKG showing normal sinus rhythm with first-degree AV block occasional premature ventricular complex.  Nonspecific ST depression in lead 4 5 and in V6.  Initial troponin is 100.  Pending second troponin level.  Pending CBC, BMP and proBNP.  Rehab physician Dr. Lorilee has been spoke with on-call neurology Dr. Vanessa and per neurology it is absolutely contraindicated to start patient on heparin drip in the setting of hemorrhagic stroke however it is safe to continue the aspirin . On-call cardiology Dr. Lea has been consulted as well pending recommendation.  Patient received aspirin  load 325 mg.   Transferring patient to inpatient progressive unit for further care and management. Hospitalist consulted for further evaluation management of nSTEMI.  During my evaluation at the bedside patient is resting comfortably in the bed.  Currently she denies any chest pain or chest pressure.  Denies any palpitation, shortness of breath, nausea, vomiting and abdominal pain.  Patient's daughter Bari at the bedside has been updated.  Discussed regarding the difficult situation as in the setting of recent thalamic hemorrhagic stroke it is absolute contraindication to treat patient with IV heparin drip or dual antiplatelet therapy as it will increase the risk of progression of the thalamic hemorrhage.  Patient's daughter reported that they are not looking forward any aggressive management neither any heart cath at this time except want to continue conservative management.  Daughter is agreeable to treat with aspirin  only.  Patient's family is very supportive and understanding.   Significant labs in the ED: Lab Orders         CBC         Basic metabolic panel       Review of Systems:  Review of Systems  Constitutional:  Negative for chills, diaphoresis, fever, malaise/fatigue and weight loss.  Respiratory:  Negative for cough, sputum production and shortness of breath.   Cardiovascular:  Positive for chest pain. Negative for palpitations, orthopnea, claudication, leg swelling and PND.  Gastrointestinal:  Negative for heartburn, nausea and vomiting.  Musculoskeletal:  Negative for myalgias.  Skin:  Positive for rash. Negative for itching.       Purpuric rash and bruising of the upper chest area.  Neurological:  Negative for dizziness and headaches.  Endo/Heme/Allergies:  Bruises/bleeds easily.  Psychiatric/Behavioral:  The patient is not nervous/anxious.  Past Medical History:  Diagnosis Date   Arthritis    Breast cancer (HCC) 2017   left mastectomy done 11/2015   Breast cancer in female [facility])  11/18/2015   Left: 3.9 cm tumor, T2, 1/2 sentinel nodes positive for macro metastatic disease, N1, 3 negative nodes in the axillary tail, ER+,PR+, Her 2 neu, low Mammoprint score   Cancer (HCC)    thyroid  takes levothyroxine    CAP (community acquired pneumonia) 05/05/2021   Chronic kidney disease    UTI   Genetic screening 11/2015   Mammoprint of left breast cancer: Low risk for recurrence.    History of heart attack 06/12/2014   Hypertension    hypothyroidism    secondary to thyroidectomy for thyroid  ca   Hypothyroidism    Lupus    subcutaneous   Menopause 40s   natural, hot flashes and mood lability now gone, off prempro 7 months   Myocardial infarction [facility]) 2013   Osteoporosis    Osteopenia   Rosacea    Sjoegren syndrome    Stress-induced cardiomyopathy September of 2013   EF 35%. Peak troponin was 1.8.   Stroke [facility]) 10/2018    Past Surgical History:  Procedure Laterality Date   BACK SURGERY     BREAST BIOPSY Left 10/30/15   positive, done in Dr. Fredirick office   CARDIAC CATHETERIZATION  05/2012   [facility]. No significant CAD. Ejection fraction of 35% due to stress-induced cardiomyopathy.   CHOLECYSTECTOMY     COLONOSCOPY     DILATION AND CURETTAGE OF UTERUS     KYPHOSIS SURGERY  Feb 2008   L1, Dr. Kathi   LUMBAR DISC SURGERY     L4-L5   MASTECTOMY Left 11/18/2015   positive   SENTINEL NODE BIOPSY Left 11/18/2015   Procedure: SENTINEL NODE BIOPSY;  Surgeon: Reyes LELON Cota, MD;  Location: [facility] ORS;  Service: General;  Laterality: Left;   SHOULDER ARTHROSCOPY  2004   Left, Dr. Maryl   SIMPLE MASTECTOMY WITH AXILLARY SENTINEL NODE BIOPSY Left 11/18/2015   Procedure: SIMPLE MASTECTOMY;  Surgeon: Reyes LELON Cota, MD;  Location: [facility] ORS;  Service: General;  Laterality: Left;   SPINE SURGERY     L4-5 diskectomy   THYROIDECTOMY     Thyroid  Cancer   TONSILLECTOMY     TUBAL LIGATION       reports that she has never smoked. She has never used smokeless tobacco.  She reports that she does not drink alcohol  and does not use drugs.  Allergies[1]  Family History  Problem Relation Age of Onset   Kidney disease Mother    Heart disease Mother    COPD Father    Cancer Father        esophageal   Kidney disease Sister    Cancer Brother 58       colon cancer (both brothers)   Cancer Brother    Heart attack Brother 42   Heart disease Brother    Breast cancer Neg Hx     Prior to Admission medications  Medication Sig Start Date End Date Taking? Authorizing Provider  acetaminophen  (TYLENOL ) 500 MG tablet Take 500 mg by mouth every 4 (four) hours as needed for mild pain (pain score 1-3) or moderate pain (pain score 4-6).   Yes [provider]  atorvastatin  (LIPITOR) 20 MG tablet TAKE 1 TABLET BY MOUTH EVERY DAY AT 6 PM 04/10/24  Yes Marylynn Verneita CROME, MD  Calcium  Carbonate-Vitamin D  600-200 MG-UNIT TABS Take 2 tablets by  mouth daily.   Yes [provider]  CRANBERRY PO Take 1 capsule by mouth 2 (two) times daily.   Yes [provider]  folic acid  (FOLVITE ) 1 MG tablet Take 3 mg by mouth daily. 08/13/21  Yes [provider]  furosemide  (LASIX ) 20 MG tablet TAKE 1 TABLET BY MOUTH EVERY OTHER DAY AS NEEDED Patient taking differently: Take 20 mg by mouth See admin instructions. Take 20 mg by mouth every other day as needed for fluid. 08/14/24  Yes Marylynn Verneita CROME, MD  losartan  (COZAAR ) 25 MG tablet Take 25 mg by mouth daily.   Yes [provider]  methotrexate  (RHEUMATREX) 2.5 MG tablet Take 20 mg by mouth once a week. Caution:Chemotherapy. Protect from light.   Yes [provider]  metoprolol  succinate (TOPROL -XL) 25 MG 24 hr tablet Take 25 mg by mouth daily.   Yes [provider]  ondansetron  (ZOFRAN ) 4 MG tablet Take 4 mg by mouth every 6 (six) hours as needed for nausea or vomiting.   Yes [provider]  polyethylene glycol (MIRALAX  / GLYCOLAX ) packet Take 17 g by mouth daily.   Yes  [provider]  predniSONE  (DELTASONE ) 2.5 MG tablet Take 2.5 mg by mouth daily. 08/14/24  Yes [provider]  Probiotic Product (PROBIOTIC PO) Take 1 tablet by mouth daily.   Yes [provider]  acetaminophen  (TYLENOL ) 325 MG tablet Take 2 tablets (650 mg total) by mouth every 6 (six) hours as needed for fever (Fever >/= 101). 09/20/24   Josette Ade, MD  artificial tears (LACRILUBE) OINT ophthalmic ointment Place into both eyes at bedtime. 09/25/24   Love, Sharlet RAMAN, PA-C  artificial tears ophthalmic solution Place 1 drop into both eyes 4 (four) times daily as needed for dry eyes. 09/20/24   Josette Ade, MD  feeding supplement (ENSURE PLUS HIGH PROTEIN) LIQD Take 237 mLs by mouth 2 (two) times daily between meals. 09/20/24   Josette Ade, MD  fluticasone  (FLONASE ) 50 MCG/ACT nasal spray Place 1 spray into both nostrils daily. Patient not taking: Reported on 09/20/2024    [provider]  levothyroxine  (SYNTHROID ) 75 MCG tablet TAKE 1 TABLET BY MOUTH ( TOTAL) DAILY 10/04/24   Marylynn Verneita CROME, MD  meclizine  (ANTIVERT ) 12.5 MG tablet Take 1 tablet (12.5 mg total) by mouth 3 (three) times daily as needed for dizziness or nausea. 09/28/24   Love, Sharlet RAMAN, PA-C  omeprazole  (PRILOSEC) 20 MG capsule TAKE 1 CAPSULE (20 MG TOTAL) BY MOUTH 2 (TWO) TIMES DAILY BEFORE A MEAL. 10/04/24   Marylynn Verneita CROME, MD  oxyCODONE  (OXY IR/ROXICODONE ) 5 MG immediate release tablet Take 1 tablet (5 mg total) by mouth every 8 (eight) hours as needed for moderate pain (pain score 4-6). Patient not taking: Reported on 09/20/2024 09/20/24   Josette Ade, MD  sertraline  (ZOLOFT ) 25 MG tablet TAKE 1 TABLET (25 MG TOTAL) BY MOUTH DAILY. 09/22/24   Marylynn Verneita CROME, MD  Sodium Fluoride 1.1 % PSTE Place 1 Application onto teeth at bedtime. Patient not taking: Reported on 09/20/2024    [provider]  triamcinolone  cream (KENALOG ) 0.1 % Apply 1 Application topically 2 (two) times  daily. Patient not taking: Reported on 09/20/2024    [provider]  witch hazel-glycerin  (TUCKS) pad Apply topically as needed for itching. 09/28/24   Maurice Sharlet RAMAN, PA-C     Physical Exam: Vitals:   10/08/24 2136 10/08/24 2138  BP: 118/63 (!) 128/55  Resp:  18  Temp:  97.8 F (36.6 C)  TempSrc:  Oral    Physical Exam Nursing note reviewed.  HENT:     Mouth/Throat:     Mouth: Mucous membranes are moist.  Eyes:     Pupils: Pupils are equal, round, and reactive to light.  Cardiovascular:     Rate and Rhythm: Normal rate and regular rhythm.     Heart sounds: Normal heart sounds.  Pulmonary:     Effort: Pulmonary effort is normal.     Breath sounds: Normal breath sounds.  Abdominal:     Palpations: Abdomen is soft.  Skin:    Capillary Refill: Capillary refill takes less than 2 seconds.     Findings: Bruising, erythema and rash present. No lesion.  Neurological:     Mental Status: She is alert and oriented to person, place, and time.  Psychiatric:        Mood and Affect: Mood normal.      Labs on Admission: I have personally reviewed following labs and imaging studies  CBC: Recent Labs  Lab 10/02/24 0451 10/05/24 0505 10/08/24 2023  WBC 3.7* 2.8* 3.3*  NEUTROABS 2.3 1.1* 1.9  HGB 8.5* 8.8* 9.2*  HCT 24.4* 25.5* 26.9*  MCV 101.2* 101.6* 103.1*  PLT 100* 100* 116*   Basic Metabolic Panel: Recent Labs  Lab 10/02/24 0451 10/05/24 0505 10/08/24 2023  NA 133* 134* 131*  K 4.3 4.3 4.7  CL 99 99 95*  CO2 26 28 29   GLUCOSE 92 89 102*  BUN 14 15 13   CREATININE 0.72 0.70 0.69  CALCIUM  8.3* 8.6* 8.8*   GFR: Estimated Creatinine Clearance: 41 mL/min (by C-G formula based on SCr of 0.69 mg/dL). Liver Function Tests: No results for input(s): AST, ALT, ALKPHOS, BILITOT, PROT, ALBUMIN in the last 168 hours. No results for input(s): LIPASE, AMYLASE in the last 168 hours. No results for input(s): AMMONIA in the last 168  hours. Coagulation Profile: No results for input(s): INR, PROTIME in the last 168 hours. Cardiac Enzymes: No results for input(s): CKTOTAL, CKMB, CKMBINDEX, TROPONINI, TROPONINIHS in the last 168 hours. BNP (last 3 results) No results for input(s): BNP in the last 8760 hours. HbA1C: No results for input(s): HGBA1C in the last 72 hours. CBG: No results for input(s): GLUCAP in the last 168 hours. Lipid Profile: No results for input(s): CHOL, HDL, LDLCALC, TRIG, CHOLHDL, LDLDIRECT in the last 72 hours. Thyroid  Function Tests: No results for input(s): TSH, T4TOTAL, FREET4, T3FREE, THYROIDAB in the last 72 hours. Anemia Panel: No results for input(s): VITAMINB12, FOLATE, FERRITIN, TIBC, IRON , RETICCTPCT in the last 72 hours. Urine analysis:    Component Value Date/Time   COLORURINE YELLOW (A) 09/06/2024 0621   APPEARANCEUR HAZY (A) 09/06/2024 0621   APPEARANCEUR Slightly cloudy 05/11/2024 1108   LABSPEC 1.012 09/06/2024 0621   PHURINE 7.0 09/06/2024 0621   GLUCOSEU NEGATIVE 09/06/2024 0621   GLUCOSEU NEGATIVE 05/11/2017 1143   HGBUR NEGATIVE 09/06/2024 0621   BILIRUBINUR NEGATIVE 09/06/2024 0621   BILIRUBINUR Negative 05/11/2024 1108   KETONESUR NEGATIVE 09/06/2024 0621   PROTEINUR NEGATIVE 09/06/2024 0621   UROBILINOGEN 0.2 07/04/2018 1134   UROBILINOGEN 0.2 05/11/2017 1143   NITRITE NEGATIVE 09/06/2024 0621   LEUKOCYTESUR SMALL (A) 09/06/2024 0621    Radiological Exams on Admission: I have personally reviewed images DG CHEST PORT 1 VIEW Result Date: 10/08/2024 CLINICAL DATA:  Shortness of breath, myocardial infarction EXAM: PORTABLE CHEST 1 VIEW COMPARISON:  08/28/2024 FINDINGS: Single frontal view of the chest demonstrates a  stable cardiac silhouette. Chronic eventration of the right hemidiaphragm. No acute airspace disease, effusion, or pneumothorax. No acute bony abnormality. IMPRESSION: 1. No acute intrathoracic process.  Electronically Signed   By: Ozell Daring M.D.   On: 10/08/2024 20:24     Assessment/Plan: Principal Problem:   NSTEMI (non-ST elevated myocardial infarction) [facility]) Active Problems:   Hemorrhagic stroke (HCC)   Benign essential HTN   Thrombocytopenia   Hypothyroidism   Hx of systemic lupus erythematosus (SLE) (HCC)   Lumbar compression fracture (HCC)    Assessment and Plan: NSTEMI History of stress-induced cardiomyopathy -While patient on inpatient rehab from recent hospital discharge from acute thalamic hemorrhagic stroke admitted for from 1/8 to 1/14 around 6 PM today patient developed acute onset of substernal chest pain and pressure 8/10 while patient was moving from bed to her chair.  Chest pain radiated to the left arm and shooting pain.  Pain eventually subsided with morphine  and sublingual nitroglycerin . -EKG showed normal sinus rhythm heart rate 83, first-degree AV block and ST depression in lead V5 to V6. -Initial troponin 100 trended up to 689. -Discussed with on-call neurology Dr. Chaim;  given patient has recent acute thalamic hemorrhagic stroke IV heparin and dual antiplatelets contraindicated.  However single antiplatelet therapy and pharmacological DVT prophylaxis is safe to continue. -Patient received aspirin  load and currently on IV Lovenox  as pharmacological DVT prophylaxis. - Patient received aspirin  load 325 milligram.  Followed by continue aspirin  81 mg daily.  Continue Lipitor 20 mg daily - Continue to trend troponin. -Obtain echocardiogram to assess any wall motion abnormality. -Family has been updated. -Of note, when patient found to have acute thalamic hemorrhagic stroke initially patient was on comfort care afterward family reverted to DNR status and did not wanted any aggressive intervention. -Continue sublingual nitroglycerin , morphine  as needed.   -Patient's daughter has been updated by Dr.Raulkar about chest pain and new diagnosis of NSTEMI. -Both  cardiology and neurology on board appreciate input.   Recent acute thalamic hemorrhagic stroke 1/8 - Patient found to have acute right thalamic hemorrhagic stroke with mild edema with midline shift on recent imaging.  And repeat head CT scan showed stable and there is no new evidence of bleeding or worsening edema.  Family decided to stay conservative and does not wanted to pursue further stroke workup.  In the setting of hemorrhagic stroke not a candidate for antiplatelet or blood thinner at that time.  Patient was discharged to inpatient rehab after PT OT evaluation. -Continue PT and OT evaluation as patient tolerates  Essential hypertension - Continue Toprol -XL.  Continue to hold losartan  as blood pressure borderline soft.   History of Sjogren syndrome History of lupus -Continue methotrexate  25 mg weekly-every Sunday and prednisone  2.5 mg daily  Hypothyroidism -Continue levothyroxine   Chronic thrombocytopenia - Platelet count 116 which is around baseline.  Continue to monitor.  Chronic macrocytic anemia - Stable H&H 9.2 and 26.  MCV 103.  Continue to monitor CBC.   Leukopenia - Low WBC count 3.3.  WBC count was around 813 days ago.  Continue to monitor.   History of lumbar compression fracture -Patient did not tolerate back brace placement.   -Continue supportive care.   DVT prophylaxis:  Lovenox  Code Status:  DNR/DNI(Do NOT Intubate) Diet: Heart healthy diet Family Communication: Patient's daughter Bari at the bedside discussed all acute conditions including management and oppportunity was given to ask question and all questions were answered satisfactorily.  Disposition Plan: Continue trend troponin and follow-up with echocardiogram.  Consults: Cardiology and neurology Admission status:   Inpatient, Step Down Unit  Severity of Illness: The appropriate patient status for this patient is INPATIENT. Inpatient status is judged to be reasonable and necessary in order to  provide the required intensity of service to ensure the patient's safety. The patient's presenting symptoms, physical exam findings, and initial radiographic and laboratory data in the context of their chronic comorbidities is felt to place them at high risk for further clinical deterioration. Furthermore, it is not anticipated that the patient will be medically stable for discharge from the hospital within 2 midnights of admission.   * I certify that at the point of admission it is my clinical judgment that the patient will require inpatient hospital care spanning beyond 2 midnights from the point of admission due to high intensity of service, high risk for further deterioration and high frequency of surveillance required.DEWAINE    Doneisha Ivey, MD [facility]  *Please note that this is a verbal dictation therefore any spelling or grammatical errors are due to the Dragon [employer] One system interpretation.   Please page via Amion and do not message via secure chat for urgent patient care matters. Secure chat can be used for non urgent patient care matters.  How to contact the [facility] Attending or Consulting provider 7A - 7P or covering provider during after hours 7P -7A, for this patient.  Check the care team in [facility] and look for a) attending/consulting [facility] provider listed and b) the [facility] team listed Log into [email or web address] and use [facility] [place] [facility]'s universal password to access. If you do not have the password, please contact the hospital operator. Locate the [facility] provider you are looking for under [facility] and page to a number that you can be directly reached. If you still have difficulty reaching the provider, please page the [facility] (Director on Call) for the Hospitalists listed on amion for assistance.  10/08/2024, 10:53 PM               [1]  Allergies Allergen Reactions   Naprosyn [Naproxen] Swelling   Amoxicillin  Rash    Tolerates first and third generation cephalosporins    Azathioprine  Nausea And Vomiting    Severe vomiting   Codeine Nausea And Vomiting   Hydroxychloroquine Hives and Nausea And Vomiting   Mycophenolate Mofetil Nausea Only   Orudis [Ketoprofen] Hives   Sulfa Antibiotics Rash   Sulfathiazole Rash   "

## 2024-10-08 NOTE — H&P (Deleted)
 " History and Physical    Kim Santiago FMW:[id number] DOB: [birth year] DOA: 10/08/2024  PCP: Kim Verneita CROME, MD   Patient coming from: Home   Chief Complaint: Chest pain   HPI:  Kim Santiago is a 89 y.o. female with medical history significant of medical history of recent thalamic hemorrhagic stroke diagnosed in 1/8 initially patient was on comfort care however family revoked and transition to DNR limited however at that point patient's family did not wanted aggressive intervention except continue conservative management patient eventually discharged to inpatient rehab 1/14, essential hypertension, Sjogren syndrome, history of lupus, hypothyroidism, dyslipidemia, lumbar compression fracture who is currently at inpatient rehab.  Hospitalist has been called by request of Dr. Vicky for evaluation for chest pain.  Rapid response called around 6 PM as patient was complaining about chest pain 8 out of 10 on the left side and into the back describing pressure-like sensation.  Patient denies shortness of breath palpitation and flutter.  Patient reported episode began when she was moving from chair to the bed and vomiting began.  At presentation patient is hemodynamically stable.  Patient was given sublingual nitroglycerin , morphine  and afterwards chest pain improved eventually resolved.   EKG showing normal sinus rhythm with first-degree AV block occasional premature ventricular complex.  Nonspecific ST depression in lead 4 5 and in V6.  Initial troponin is 100.  Pending second troponin level.  Pending CBC, BMP and proBNP.  Rehab physician Dr. Lorilee has been spoke with on-call neurology Dr. Vanessa and per neurology it is absolutely contraindicated to start patient on heparin drip in the setting of hemorrhagic stroke however it is safe to continue the aspirin . On-call cardiology Dr. Lea has been consulted as well pending recommendation.  Patient received aspirin  load 325 mg.   Transferring patient to inpatient progressive unit for further care and management. Hospitalist consulted for further evaluation management of nSTEMI.  During my evaluation at the bedside patient is resting comfortably in the bed.  Currently she denies any chest pain or chest pressure.  Denies any palpitation, shortness of breath, nausea, vomiting and abdominal pain.  Patient's daughter Kim Santiago at the bedside has been updated.  Discussed regarding the difficult situation as in the setting of recent thalamic hemorrhagic stroke it is absolute contraindication to treat patient with IV heparin drip or dual antiplatelet therapy as it will increase the risk of progression of the thalamic hemorrhage.  Patient's daughter reported that they are not looking forward any aggressive management neither any heart cath at this time except want to continue conservative management.  Daughter is agreeable to treat with aspirin  only.  Patient's family is very supportive and understanding.   Significant labs in the ED: Lab Orders         CBC         Basic metabolic panel       Review of Systems:  Review of Systems  Constitutional:  Negative for chills, diaphoresis, fever, malaise/fatigue and weight loss.  Respiratory:  Negative for cough, sputum production and shortness of breath.   Cardiovascular:  Positive for chest pain. Negative for palpitations, orthopnea, claudication, leg swelling and PND.  Gastrointestinal:  Negative for heartburn, nausea and vomiting.  Musculoskeletal:  Negative for myalgias.  Skin:  Positive for rash. Negative for itching.       Purpuric rash and bruising of the upper chest area.  Neurological:  Negative for dizziness and headaches.  Endo/Heme/Allergies:  Bruises/bleeds easily.  Psychiatric/Behavioral:  The patient is not nervous/anxious.  Past Medical History:  Diagnosis Date   Arthritis    Breast cancer (HCC) 2017   left mastectomy done 11/2015   Breast cancer in female [facility])  11/18/2015   Left: 3.9 cm tumor, T2, 1/2 sentinel nodes positive for macro metastatic disease, N1, 3 negative nodes in the axillary tail, ER+,PR+, Her 2 neu, low Mammoprint score   Cancer (HCC)    thyroid  takes levothyroxine    CAP (community acquired pneumonia) 05/05/2021   Chronic kidney disease    UTI   Genetic screening 11/2015   Mammoprint of left breast cancer: Low risk for recurrence.    History of heart attack 06/12/2014   Hypertension    hypothyroidism    secondary to thyroidectomy for thyroid  ca   Hypothyroidism    Lupus    subcutaneous   Menopause 40s   natural, hot flashes and mood lability now gone, off prempro 7 months   Myocardial infarction [facility]) 2013   Osteoporosis    Osteopenia   Rosacea    Sjoegren syndrome    Stress-induced cardiomyopathy September of 2013   EF 35%. Peak troponin was 1.8.   Stroke [facility]) 10/2018    Past Surgical History:  Procedure Laterality Date   BACK SURGERY     BREAST BIOPSY Left 10/30/15   positive, done in Dr. Fredirick office   CARDIAC CATHETERIZATION  05/2012   [facility]. No significant CAD. Ejection fraction of 35% due to stress-induced cardiomyopathy.   CHOLECYSTECTOMY     COLONOSCOPY     DILATION AND CURETTAGE OF UTERUS     KYPHOSIS SURGERY  Feb 2008   L1, Dr. Kathi   LUMBAR DISC SURGERY     L4-L5   MASTECTOMY Left 11/18/2015   positive   SENTINEL NODE BIOPSY Left 11/18/2015   Procedure: SENTINEL NODE BIOPSY;  Surgeon: Reyes LELON Cota, MD;  Location: [facility] ORS;  Service: General;  Laterality: Left;   SHOULDER ARTHROSCOPY  2004   Left, Dr. Maryl   SIMPLE MASTECTOMY WITH AXILLARY SENTINEL NODE BIOPSY Left 11/18/2015   Procedure: SIMPLE MASTECTOMY;  Surgeon: Reyes LELON Cota, MD;  Location: [facility] ORS;  Service: General;  Laterality: Left;   SPINE SURGERY     L4-5 diskectomy   THYROIDECTOMY     Thyroid  Cancer   TONSILLECTOMY     TUBAL LIGATION       reports that she has never smoked. She has never used smokeless tobacco.  She reports that she does not drink alcohol  and does not use drugs.  Allergies[1]  Family History  Problem Relation Age of Onset   Kidney disease Mother    Heart disease Mother    COPD Father    Cancer Father        esophageal   Kidney disease Sister    Cancer Brother 5       colon cancer (both brothers)   Cancer Brother    Heart attack Brother 59   Heart disease Brother    Breast cancer Neg Hx     Prior to Admission medications  Medication Sig Start Date End Date Taking? Authorizing Provider  acetaminophen  (TYLENOL ) 500 MG tablet Take 500 mg by mouth every 4 (four) hours as needed for mild pain (pain score 1-3) or moderate pain (pain score 4-6).   Yes [provider]  atorvastatin  (LIPITOR) 20 MG tablet TAKE 1 TABLET BY MOUTH EVERY DAY AT 6 PM 04/10/24  Yes Kim Verneita CROME, MD  Calcium  Carbonate-Vitamin D  600-200 MG-UNIT TABS Take 2 tablets by  mouth daily.   Yes [provider]  CRANBERRY PO Take 1 capsule by mouth 2 (two) times daily.   Yes [provider]  folic acid  (FOLVITE ) 1 MG tablet Take 3 mg by mouth daily. 08/13/21  Yes [provider]  furosemide  (LASIX ) 20 MG tablet TAKE 1 TABLET BY MOUTH EVERY OTHER DAY AS NEEDED Patient taking differently: Take 20 mg by mouth See admin instructions. Take 20 mg by mouth every other day as needed for fluid. 08/14/24  Yes Kim Verneita CROME, MD  losartan  (COZAAR ) 25 MG tablet Take 25 mg by mouth daily.   Yes [provider]  methotrexate  (RHEUMATREX) 2.5 MG tablet Take 20 mg by mouth once a week. Caution:Chemotherapy. Protect from light.   Yes [provider]  metoprolol  succinate (TOPROL -XL) 25 MG 24 hr tablet Take 25 mg by mouth daily.   Yes [provider]  ondansetron  (ZOFRAN ) 4 MG tablet Take 4 mg by mouth every 6 (six) hours as needed for nausea or vomiting.   Yes [provider]  polyethylene glycol (MIRALAX  / GLYCOLAX ) packet Take 17 g by mouth daily.   Yes  [provider]  predniSONE  (DELTASONE ) 2.5 MG tablet Take 2.5 mg by mouth daily. 08/14/24  Yes [provider]  Probiotic Product (PROBIOTIC PO) Take 1 tablet by mouth daily.   Yes [provider]  acetaminophen  (TYLENOL ) 325 MG tablet Take 2 tablets (650 mg total) by mouth every 6 (six) hours as needed for fever (Fever >/= 101). 09/20/24   Josette Ade, MD  artificial tears (LACRILUBE) OINT ophthalmic ointment Place into both eyes at bedtime. 09/25/24   Love, Sharlet RAMAN, PA-C  artificial tears ophthalmic solution Place 1 drop into both eyes 4 (four) times daily as needed for dry eyes. 09/20/24   Josette Ade, MD  feeding supplement (ENSURE PLUS HIGH PROTEIN) LIQD Take 237 mLs by mouth 2 (two) times daily between meals. 09/20/24   Josette Ade, MD  fluticasone  (FLONASE ) 50 MCG/ACT nasal spray Place 1 spray into both nostrils daily. Patient not taking: Reported on 09/20/2024    [provider]  levothyroxine  (SYNTHROID ) 75 MCG tablet TAKE 1 TABLET BY MOUTH ( TOTAL) DAILY 10/04/24   Kim Verneita CROME, MD  meclizine  (ANTIVERT ) 12.5 MG tablet Take 1 tablet (12.5 mg total) by mouth 3 (three) times daily as needed for dizziness or nausea. 09/28/24   Love, Sharlet RAMAN, PA-C  omeprazole  (PRILOSEC) 20 MG capsule TAKE 1 CAPSULE (20 MG TOTAL) BY MOUTH 2 (TWO) TIMES DAILY BEFORE A MEAL. 10/04/24   Kim Verneita CROME, MD  oxyCODONE  (OXY IR/ROXICODONE ) 5 MG immediate release tablet Take 1 tablet (5 mg total) by mouth every 8 (eight) hours as needed for moderate pain (pain score 4-6). Patient not taking: Reported on 09/20/2024 09/20/24   Josette Ade, MD  sertraline  (ZOLOFT ) 25 MG tablet TAKE 1 TABLET (25 MG TOTAL) BY MOUTH DAILY. 09/22/24   Kim Verneita CROME, MD  Sodium Fluoride 1.1 % PSTE Place 1 Application onto teeth at bedtime. Patient not taking: Reported on 09/20/2024    [provider]  triamcinolone  cream (KENALOG ) 0.1 % Apply 1 Application topically 2 (two) times  daily. Patient not taking: Reported on 09/20/2024    [provider]  witch hazel-glycerin  (TUCKS) pad Apply topically as needed for itching. 09/28/24   Maurice Sharlet RAMAN, PA-C     Physical Exam: Vitals:   10/08/24 2136 10/08/24 2138  BP: 118/63 (!) 128/55  Resp:  18  Temp:  97.8 F (36.6 C)  TempSrc:  Oral    Physical Exam Nursing note reviewed.  HENT:     Mouth/Throat:     Mouth: Mucous membranes are moist.  Eyes:     Pupils: Pupils are equal, round, and reactive to light.  Cardiovascular:     Rate and Rhythm: Normal rate and regular rhythm.     Heart sounds: Normal heart sounds.  Pulmonary:     Effort: Pulmonary effort is normal.     Breath sounds: Normal breath sounds.  Abdominal:     Palpations: Abdomen is soft.  Skin:    Capillary Refill: Capillary refill takes less than 2 seconds.     Findings: Bruising, erythema and rash present. No lesion.  Neurological:     Mental Status: She is alert and oriented to person, place, and time.  Psychiatric:        Mood and Affect: Mood normal.      Labs on Admission: I have personally reviewed following labs and imaging studies  CBC: Recent Labs  Lab 10/02/24 0451 10/05/24 0505 10/08/24 2023  WBC 3.7* 2.8* 3.3*  NEUTROABS 2.3 1.1* 1.9  HGB 8.5* 8.8* 9.2*  HCT 24.4* 25.5* 26.9*  MCV 101.2* 101.6* 103.1*  PLT 100* 100* 116*   Basic Metabolic Panel: Recent Labs  Lab 10/02/24 0451 10/05/24 0505 10/08/24 2023  NA 133* 134* 131*  K 4.3 4.3 4.7  CL 99 99 95*  CO2 26 28 29   GLUCOSE 92 89 102*  BUN 14 15 13   CREATININE 0.72 0.70 0.69  CALCIUM  8.3* 8.6* 8.8*   GFR: Estimated Creatinine Clearance: 41 mL/min (by C-G formula based on SCr of 0.69 mg/dL). Liver Function Tests: No results for input(s): AST, ALT, ALKPHOS, BILITOT, PROT, ALBUMIN in the last 168 hours. No results for input(s): LIPASE, AMYLASE in the last 168 hours. No results for input(s): AMMONIA in the last 168  hours. Coagulation Profile: No results for input(s): INR, PROTIME in the last 168 hours. Cardiac Enzymes: No results for input(s): CKTOTAL, CKMB, CKMBINDEX, TROPONINI, TROPONINIHS in the last 168 hours. BNP (last 3 results) No results for input(s): BNP in the last 8760 hours. HbA1C: No results for input(s): HGBA1C in the last 72 hours. CBG: No results for input(s): GLUCAP in the last 168 hours. Lipid Profile: No results for input(s): CHOL, HDL, LDLCALC, TRIG, CHOLHDL, LDLDIRECT in the last 72 hours. Thyroid  Function Tests: No results for input(s): TSH, T4TOTAL, FREET4, T3FREE, THYROIDAB in the last 72 hours. Anemia Panel: No results for input(s): VITAMINB12, FOLATE, FERRITIN, TIBC, IRON , RETICCTPCT in the last 72 hours. Urine analysis:    Component Value Date/Time   COLORURINE YELLOW (A) 09/06/2024 0621   APPEARANCEUR HAZY (A) 09/06/2024 0621   APPEARANCEUR Slightly cloudy 05/11/2024 1108   LABSPEC 1.012 09/06/2024 0621   PHURINE 7.0 09/06/2024 0621   GLUCOSEU NEGATIVE 09/06/2024 0621   GLUCOSEU NEGATIVE 05/11/2017 1143   HGBUR NEGATIVE 09/06/2024 0621   BILIRUBINUR NEGATIVE 09/06/2024 0621   BILIRUBINUR Negative 05/11/2024 1108   KETONESUR NEGATIVE 09/06/2024 0621   PROTEINUR NEGATIVE 09/06/2024 0621   UROBILINOGEN 0.2 07/04/2018 1134   UROBILINOGEN 0.2 05/11/2017 1143   NITRITE NEGATIVE 09/06/2024 0621   LEUKOCYTESUR SMALL (A) 09/06/2024 0621    Radiological Exams on Admission: I have personally reviewed images DG CHEST PORT 1 VIEW Result Date: 10/08/2024 CLINICAL DATA:  Shortness of breath, myocardial infarction EXAM: PORTABLE CHEST 1 VIEW COMPARISON:  08/28/2024 FINDINGS: Single frontal view of the chest demonstrates a  stable cardiac silhouette. Chronic eventration of the right hemidiaphragm. No acute airspace disease, effusion, or pneumothorax. No acute bony abnormality. IMPRESSION: 1. No acute intrathoracic process.  Electronically Signed   By: Ozell Daring M.D.   On: 10/08/2024 20:24     Assessment/Plan: Principal Problem:   NSTEMI (non-ST elevated myocardial infarction) [facility])    Assessment and Plan: NSTEMI -While patient on inpatient rehab from recent hospital discharge from acute thalamic hemorrhagic stroke admitted for from 1/8 to 1/14 around 6 PM today patient developed acute onset of substernal chest pain and pressure 8/10 while patient was moving from bed to her chair.  Chest pain radiated to the left arm and shooting pain.  Pain eventually subsided with morphine  and sublingual nitroglycerin . -EKG showed normal sinus rhythm heart rate 83, first-degree AV block and ST depression in lead V5 to V6. -Initial troponin 100 trended up to 689. -Discussed with on-call neurology Dr. Chaim;  given patient has recent acute thalamic hemorrhagic stroke IV heparin and dual antiplatelets contraindicated.  However single antiplatelet therapy and pharmacological DVT prophylaxis is safe to continue. -Patient received aspirin  load and currently on IV Lovenox  as pharmacological DVT prophylaxis. - Patient received aspirin  load 325 milligram.  Followed by continue aspirin  81 mg daily.  Continue Lipitor 20 mg daily - Continue to trend troponin. -Obtain echocardiogram to assess any wall motion abnormality. -Family has been updated. -Of note, when patient found to have acute thalamic hemorrhagic stroke initially patient was on comfort care afterward family reverted to DNR status and did not wanted any aggressive intervention. -Continue sublingual nitroglycerin , morphine  as needed.   -Patient's daughter has been updated by Dr.Raulkar about chest pain and new diagnosis of NSTEMI. -Both cardiology and neurology on board appreciate input.   Recent acute thalamic hemorrhagic stroke 1/8 - Patient found to have acute right thalamic hemorrhagic stroke with mild edema with midline shift on recent imaging.  And repeat head  CT scan showed stable and there is no new evidence of bleeding or worsening edema.  Family decided to stay conservative and does not wanted to pursue further stroke workup.  In the setting of hemorrhagic stroke not a candidate for antiplatelet or blood thinner at that time.  Patient was discharged to inpatient rehab after PT OT evaluation. -Continue PT and OT evaluation as patient tolerates  Essential hypertension - Continue Toprol -XL.  Continue to hold losartan  as blood pressure borderline soft.   History of Sjogren syndrome History of lupus -Continue methotrexate  25 mg weekly-every Sunday and prednisone  2.5 mg daily  Hypothyroidism -Continue levothyroxine   Chronic thrombocytopenia - Platelet count 116 which is around baseline.  Continue to monitor.  Chronic macrocytic anemia - Stable H&H 9.2 and 26.  MCV 103.  Continue to monitor CBC.   Leukopenia - Low WBC count 3.3.  WBC count was around 813 days ago.  Continue to monitor.   History of lumbar compression fracture -Patient did not tolerate back brace placement.   -Continue supportive care.  DVT prophylaxis:  Lovenox  Code Status:  DNR/DNI(Do NOT Intubate) Diet: Heart healthy diet Family Communication: Patient's daughter Kim Santiago at the bedside discussed all acute conditions including management and oppportunity was given to ask question and all questions were answered satisfactorily.  Disposition Plan: Continue trend troponin and follow-up with echocardiogram. Consults: Cardiology and neurology Admission status:   Inpatient, Step Down Unit  Severity of Illness: The appropriate patient status for this patient is INPATIENT. Inpatient status is judged to be reasonable and necessary in order to provide  the required intensity of service to ensure the patient's safety. The patient's presenting symptoms, physical exam findings, and initial radiographic and laboratory data in the context of their chronic comorbidities is felt to place  them at high risk for further clinical deterioration. Furthermore, it is not anticipated that the patient will be medically stable for discharge from the hospital within 2 midnights of admission.   * I certify that at the point of admission it is my clinical judgment that the patient will require inpatient hospital care spanning beyond 2 midnights from the point of admission due to high intensity of service, high risk for further deterioration and high frequency of surveillance required.DEWAINE    Neva Ramaswamy, MD [facility]  *Please note that this is a verbal dictation therefore any spelling or grammatical errors are due to the Dragon [employer] One system interpretation.   Please page via Amion and do not message via secure chat for urgent patient care matters. Secure chat can be used for non urgent patient care matters.  How to contact the [facility] Attending or Consulting provider 7A - 7P or covering provider during after hours 7P -7A, for this patient.  Check the care team in [facility] and look for a) attending/consulting [facility] provider listed and b) the [facility] team listed Log into [email or web address] and use [facility] universal password to access. If you do not have the password, please contact the hospital operator. Locate the [facility] provider you are looking for under [facility] and page to a number that you can be directly reached. If you still have difficulty reaching the provider, please page the [facility] (Director on Call) for the Hospitalists listed on amion for assistance.  10/08/2024, 10:49 PM               [1]  Allergies Allergen Reactions   Naprosyn [Naproxen] Swelling   Amoxicillin  Rash    Tolerates first and third generation cephalosporins   Azathioprine  Nausea And Vomiting    Severe vomiting   Codeine Nausea And Vomiting   Hydroxychloroquine Hives and Nausea And Vomiting   Mycophenolate Mofetil Nausea Only   Orudis [Ketoprofen] Hives   Sulfa Antibiotics Rash    Sulfathiazole Rash   "

## 2024-10-08 NOTE — Progress Notes (Signed)
 Called to room by family because patient vomiting. Got zofran  and when returned patient was complaining of chest pain, pressure 8/10. Alerted physician and rapid response team, orders given. See RR note.   Critical troponin called in from lab, notified physician by phone. 1815 pm  End of shift, patient's pain under control with dose of morphine , Internal medicine in to assess, preparing to transfer to telemetry unit. Handed off to night RN.

## 2024-10-08 NOTE — Progress Notes (Signed)
 Patient complained of chest pain radiating into left arm. Nitrolgycerin administered x3 with minimal effect. EKG showed 1st degree AV block. Troponin 100. Cardiology consulted and felt presentation is concerning for acute MI and recommended transfer to telemetry. Consulted triad hospitalist Dr. Sundil who recommended transfer to progressive unit. Discussed with patient's daughter. Consulted neurology who recommended no IV heparin given patient's thalamic hemorrhage. Ok to continue DVT ppx and to start aspirin  325mg  daily. Discharge order placed. Charge nurse is awaiting bed number for transfer. Patient's pain has since improved after administration of po morphine  and she is resting comfortably as per daughter.

## 2024-10-08 NOTE — Progress Notes (Signed)
 Inpatient Rehabilitation Discharge Medication Review by a Pharmacist  A complete drug regimen review was completed for this patient to identify any potential clinically significant medication issues.  High Risk Drug Classes Is patient taking? Indication by Medication  Antipsychotic Yes Compazine  - nausea/vomiting  Anticoagulant Yes Lovenox  - DVT prophylaxis  Antibiotic No   Opioid Yes Oxycodone  - pain  Antiplatelet No   Hypoglycemics/insulin No   Vasoactive Medication No   Chemotherapy Yes Methotrexate  - Sjogren's syndrome and SLE  Other Yes Lipitor - HLD Synthroid  - hypothyroid Prednisone  - Sjogren's syndrome and SLE Sertraline  - MDD Melatonin - sleep Miralax , Senokot-S - constipation Calcium , Magnesium  and folic acid , Risaquad - supplementation Protonix  - GERD Melatonin - sleep     Type of Medication Issue Identified Description of Issue Recommendation(s)  Drug Interaction(s) (clinically significant)     Duplicate Therapy     Allergy     No Medication Administration End Date     Incorrect Dose     Additional Drug Therapy Needed     Significant med changes from prior encounter (inform family/care partners about these prior to discharge).    Other       Clinically significant medication issues were identified that warrant physician communication and completion of prescribed/recommended actions by midnight of the next day:  No  Name of provider notified for urgent issues identified:   Provider Method of Notification:     Pharmacist comments:   Time spent performing this drug regimen review (minutes):  15   Kim Santiago, PharmD, BCPS 10/08/2024 8:21 PM

## 2024-10-09 ENCOUNTER — Inpatient Hospital Stay (HOSPITAL_COMMUNITY)

## 2024-10-09 DIAGNOSIS — R7989 Other specified abnormal findings of blood chemistry: Secondary | ICD-10-CM | POA: Diagnosis not present

## 2024-10-09 DIAGNOSIS — I214 Non-ST elevation (NSTEMI) myocardial infarction: Secondary | ICD-10-CM | POA: Diagnosis not present

## 2024-10-09 DIAGNOSIS — D696 Thrombocytopenia, unspecified: Secondary | ICD-10-CM | POA: Diagnosis not present

## 2024-10-09 DIAGNOSIS — R079 Chest pain, unspecified: Secondary | ICD-10-CM

## 2024-10-09 LAB — CBC
HCT: 24.4 % — ABNORMAL LOW (ref 36.0–46.0)
Hemoglobin: 8.4 g/dL — ABNORMAL LOW (ref 12.0–15.0)
MCH: 35.4 pg — ABNORMAL HIGH (ref 26.0–34.0)
MCHC: 34.4 g/dL (ref 30.0–36.0)
MCV: 103 fL — ABNORMAL HIGH (ref 80.0–100.0)
Platelets: 101 10*3/uL — ABNORMAL LOW (ref 150–400)
RBC: 2.37 MIL/uL — ABNORMAL LOW (ref 3.87–5.11)
RDW: 20 % — ABNORMAL HIGH (ref 11.5–15.5)
WBC: 3 10*3/uL — ABNORMAL LOW (ref 4.0–10.5)
nRBC: 0 % (ref 0.0–0.2)

## 2024-10-09 LAB — ECHOCARDIOGRAM COMPLETE
Area-P 1/2: 2.74 cm2
Calc EF: 61 %
S' Lateral: 1.9 cm
Single Plane A2C EF: 64.6 %
Single Plane A4C EF: 55.4 %
Weight: 1947.1 [oz_av]

## 2024-10-09 LAB — TROPONIN T, HIGH SENSITIVITY
Troponin T High Sensitivity: 429 ng/L (ref 0–19)
Troponin T High Sensitivity: 299 ng/L (ref 0–19)

## 2024-10-09 LAB — BASIC METABOLIC PANEL WITH GFR
Anion gap: 10 (ref 5–15)
BUN: 14 mg/dL (ref 8–23)
CO2: 26 mmol/L (ref 22–32)
Calcium: 8.4 mg/dL — ABNORMAL LOW (ref 8.9–10.3)
Chloride: 96 mmol/L — ABNORMAL LOW (ref 98–111)
Creatinine, Ser: 0.72 mg/dL (ref 0.44–1.00)
GFR, Estimated: >60 mL/min
Glucose, Bld: 86 mg/dL (ref 70–99)
Potassium: 4.5 mmol/L (ref 3.5–5.1)
Sodium: 132 mmol/L — ABNORMAL LOW (ref 135–145)

## 2024-10-09 MED ORDER — POLYVINYL ALCOHOL 1.4 % OP SOLN: 1.0000 [drp] | Freq: Four times a day (QID) | OPHTHALMIC | Status: DC | PRN

## 2024-10-09 MED ORDER — ARTIFICIAL TEARS OPHTHALMIC OINT
TOPICAL_OINTMENT | Freq: Every day | OPHTHALMIC | Status: DC
  Filled 2024-10-09: qty 3.5

## 2024-10-09 MED ORDER — POLYETHYLENE GLYCOL 3350 17 G PO PACK
17.0000 g | PACK | Freq: Every day | ORAL | Status: DC
  Administered 2024-10-09 – 2024-10-10 (×2): 17 g via ORAL
  Filled 2024-10-09 (×2): qty 1

## 2024-10-09 MED ORDER — PERFLUTREN LIPID MICROSPHERE
1.0000 mL | INTRAVENOUS | Status: AC | PRN
  Administered 2024-10-09: 2 mL via INTRAVENOUS

## 2024-10-09 MED ORDER — MELATONIN 3 MG PO TABS: 3.0000 mg | ORAL_TABLET | Freq: Every evening | ORAL | Status: DC | PRN

## 2024-10-09 NOTE — Progress Notes (Signed)
 Inpatient Rehabilitation Care Coordinator Discharge Note   Patient Details  Name: Kim Santiago MRN: [id number] Date of Birth: 1934/10/23   Discharge location: transferred to acute for medical issues  Length of Stay: 18 days  Discharge activity level: min-mod level  Home/community participation: homebound  Patient response un:Yzjouy Literacy - How often do you need to have someone help you when you read instructions, pamphlets, or other written material from your doctor or pharmacy?: Never  Patient response un:Dnrpjo Isolation - How often do you feel lonely or isolated from those around you?: Patient unable to respond  Services provided included: MD, RD, PT, OT, SLP, RN, CM, TR, Pharmacy, SW  Financial Services:  Financial Services Utilized: [employer] health team advantage  Choices offered to/list presented to: pt and daughter  Follow-up services arranged:  Home Health, DME, Patient/Family has no preference for HH/DME agencies Home Health Agency: [facility]  pt  ot  sp  aide    DME : [facility]  wheelchair, hospital bed, drop-arm bedside commode    Patient response to transportation need: Is the patient able to respond to transportation needs?: Yes In the past 12 months, has lack of transportation kept you from medical appointments or from getting medications?: No In the past 12 months, has lack of transportation kept you from meetings, work, or from getting things needed for daily living?: No   Patient/Family verbalized understanding of follow-up arrangements:  Yes  Individual responsible for coordination of the follow-up plan: Kim Santiago  Confirmed correct DME delivered: Kim Santiago    Comments (or additional information):pt transferred to acute for medical issues. Had not finished her rehab stay. Daughter needed to complete family education in preparation for discharge  Summary of Stay    Date/Time Discharge Planning CSW   10/04/24 [phone] Daughter has been here and participated in some therapies when appropriate, she is concerned can not provide the assist Mom will need. Encouraged team to include her when here. Will set up [facility] and await equipment needs BGD  09/27/24 0849 Home with daughter and son in-law who between them can provide care. Daughter does teach two classes at [employer] twice a week. Will work on care and provide resources to them await team's receommendations BGD       Kim Santiago

## 2024-10-09 NOTE — Progress Notes (Signed)
 Occupational Therapy Discharge Note  This patient was unable to complete the inpatient rehab program due to Pt transferred off unit 2/2 chest pain radiating down R arm with potential of MI; therefore did not meet their long term goals. Pt left the program at a CGA-MIN assist level for their  functional ADLs. This patient is being discharged from OT services at this time.  BIMS at time of d/c  Pt unable to complete due to medical status  See CareTool for functional status details.  If the patient is able to return to inpatient rehabilitation within 3 midnights, this may be considered an interrupted stay and therapy services will resume as ordered. Modification and reinstatement of their goals will be made upon completion of therapy service reevaluations.

## 2024-10-09 NOTE — TOC Initial Note (Signed)
 Transition of Care [facility]) - Initial/Assessment Note    Patient Details  Name: Kim Santiago MRN: [id number] Date of Birth: October 21, 1934  [facility]) CM/SW Contact:    Sudie Erminio Deems, RN Phone Number: 10/09/2024, 11:14 AM  Clinical Narrative: Patient was previously at inpatient rehab and presented for chest pain. Patient was to be discharged home from rehab this Wednesday. The plan remains for home health once medically stable. ICM spoke with daughter regarding home health; reviewed (Medicare.gov list) and DME needs. HH PT/OT/Aide-referral submitted to [facility] via the hub and the agency accepted. Start of care to begin within 24-48 hours post transition home. ICM did call [employer] regarding DME hospital bed, wheelchair, and drop arm bedside commode (asked for DME to be delivered as soon as possible). ICM will continue to follow for additional needs as the patient progresses.        Expected Discharge Plan: Home w Home Health Services Barriers to Discharge: No Barriers Identified   Patient Goals and CMS Choice Patient states their goals for this hospitalization and ongoing recovery are:: Plan to return home with home health services   Choice offered to / list presented to : Adult Children (reviewed the Medicare.gov list telephonically with daughter)      Expected Discharge Plan and Services In-house Referral: NA Discharge Planning Services: CM Consult Post Acute Care Choice: Home Health Living arrangements for the past 2 months: Single Family Home                 DME Arranged: Hospital bed, Lightweight manual wheelchair with seat cushion, Bedside commode (drop arm)   Date DME Agency Contacted: 10/09/24 Time DME Agency Contacted: 1110 Representative spoke with at DME Agency: [facility] HH Arranged: PT, OT, Nurse's Aide HH Agency: [facility] Date [facility] Agency Contacted: 10/09/24 Time HH Agency Contacted: 1113 Representative spoke with at [facility] Agency:  hub  Prior Living Arrangements/Services Living arrangements for the past 2 months: Single Family Home Lives with:: Adult Children Patient language and need for interpreter reviewed:: Yes Do you feel safe going back to the place where you live?: Yes      Need for Family Participation in Patient Care: Yes (Comment) Care giver support system in place?: Yes (comment) Current home services: DME (shower chair, rolling walker and rollator.) Criminal Activity/Legal Involvement Pertinent to Current Situation/Hospitalization: No - Comment as needed  Activities of Daily Living   ADL Screening (condition at time of admission) Independently performs ADLs?: No Does the patient have a NEW difficulty with bathing/dressing/toileting/self-feeding that is expected to last >3 days?: No Does the patient have a NEW difficulty with getting in/out of bed, walking, or climbing stairs that is expected to last >3 days?: No Does the patient have a NEW difficulty with communication that is expected to last >3 days?: No Is the patient deaf or have difficulty hearing?: Yes Does the patient have difficulty seeing, even when wearing glasses/contacts?: No Does the patient have difficulty concentrating, remembering, or making decisions?: Yes  Permission Sought/Granted Permission sought to share information with : Case Manager, Magazine Features Editor, Family Supports Permission granted to share information with : Yes, Verbal Permission Granted     Permission granted to share info w AGENCY: [facility]        Emotional Assessment         Alcohol  / Substance Use: Not Applicable Psych Involvement: No (comment)  Admission diagnosis:  Acute MI (HCC) [I21.9] NSTEMI (non-ST elevated myocardial infarction) [facility]) [I21.4] Patient Active  Problem List   Diagnosis Date Noted   Thalamic hemorrhage (HCC) 09/20/2024   Lumbar compression fracture (HCC) 09/15/2024   Hypothyroidism    Hemorrhagic stroke (HCC) 09/14/2024    Closed L4 vertebral fracture (HCC) 09/06/2024   Thrombocytopenia 08/29/2024   Depression 08/29/2024   Sepsis (HCC) 08/29/2024   Influenza A with pneumonia 08/28/2024   Hyperkalemia due to angiotensin converting enzyme inhibitor (ACE-I) 07/02/2024   Radiculitis, cervical 06/06/2024   Hx of systemic lupus erythematosus (SLE) (HCC) 06/06/2024   Stage 3a chronic kidney disease (HCC) 06/06/2024   Gastritis 09/11/2023   Chronic low back pain with bilateral sciatica 09/11/2023   Gait instability 04/21/2022   Iron  deficiency anemia 12/23/2021   Frequent epistaxis 12/23/2021   Chronic diastolic heart failure (HCC) 06/19/2021   COVID-19 virus infection 04/20/2021   Generalized weakness 11/23/2020   Vaccine reaction, initial encounter 08/20/2020   Hemiplegia affecting dominant side, post-stroke (HCC) 01/16/2020   Does use hearing aid 07/14/2019   Impaired balance as late effect of cerebrovascular accident 11/13/2018   History of CVA with residual deficit 11/13/2018   Leukopenia 11/11/2018   NSTEMI (non-ST elevated myocardial infarction) (HCC) 07/29/2018   Anxiety about health 07/05/2018   Mediastinal adenopathy 04/27/2018   Edema due to hypoalbuminemia 02/20/2018   Hospital discharge follow-up 02/20/2018   History of vertebral fracture 02/18/2018   GERD (gastroesophageal reflux disease) 10/31/2017   Hand joint pain 06/13/2017   Vaginal atrophy 05/11/2017   Venous insufficiency of both lower extremities 01/10/2017   Hyponatremia 10/07/2016   Subacute cutaneous lupus erythematosus 06/26/2016   Anemia of chronic disease 05/24/2016   Recurrent UTI 04/29/2016   TMJ syndrome 03/31/2016   Hearing loss 03/12/2016   Osteopenia 12/05/2015   Hx of thyroid  cancer 06/12/2014   Adenomatous polyp 06/12/2014   Grief 04/08/2014   History of Sjogren disease 03/09/2014   Insomnia due to stress 03/06/2014   Dyslipidemia 12/28/2012   Medicare annual wellness visit, subsequent 12/28/2012   Benign  essential HTN 10/02/2011   Iatrogenic hypothyroidism 10/02/2011   Purpura 05/25/2011   PCP:  Marylynn Verneita CROME, MD Pharmacy:   [facility] [address], [place] - [address] [place] [address] Phone: [phone] Fax: [phone]     Social Drivers of Health (SDOH) Social History: SDOH Screenings   Food Insecurity: No Food Insecurity (10/08/2024)  Housing: Low Risk (10/08/2024)  Transportation Needs: No Transportation Needs (10/08/2024)  Utilities: Not At Risk (10/08/2024)  Alcohol  Screen: Low Risk (04/18/2024)  Depression (PHQ2-9): High Risk (09/01/2024)  Financial Resource Strain: Low Risk (04/18/2024)  Physical Activity: Insufficiently Active (04/18/2024)  Social Connections: Moderately Isolated (10/08/2024)  Stress: No Stress Concern Present (04/18/2024)  Tobacco Use: Low Risk (10/08/2024)  Health Literacy: Adequate Health Literacy (04/18/2024)   SDOH Interventions:     Readmission Risk Interventions     No data to display

## 2024-10-09 NOTE — Progress Notes (Signed)
"                                                                                                                 Speech Therapy Discharge Note  This patient was unable to complete the inpatient rehab program due to potential MI ; therefore, the patient did not meet their long term goals and has been discharged from skilled SLP services at this time.The patient left the program at a supervision-min assist level for overall cognitive functioning. The patient is currently consuming regular textures with thin liquids with supervision assist for use of swallowing compensatory strategies.   See CareTool for functional status details.  If the patient is able to return to inpatient rehabilitation within 3 midnights, this may be considered an interrupted stay and therapy services will resume as ordered. Modification and reinstatement of their goals will be made upon completion of therapy service reevaluations.   "

## 2024-10-09 NOTE — Progress Notes (Signed)
 MEWS Progress Note  Patient Details Name: CAITLINN KLINKER MRN: [id number] DOB: 05-Apr-1935 Today's Date: 10/09/2024   MEWS Flowsheet Documentation:  Assess: MEWS Score Temp: 97.6 F (36.4 C) BP: (!) 97/58 MAP (mmHg): 70 Pulse Rate: 82 ECG Heart Rate: 84 Resp: 12 Level of Consciousness: Alert SpO2: 99 % O2 Device: Room Air Assess: MEWS Score MEWS Temp: 0 MEWS Systolic: 1 MEWS Pulse: 0 MEWS RR: 1 MEWS LOC: 0 MEWS Score: 2 MEWS Score Color: Yellow Assess: SIRS CRITERIA SIRS Temperature : 0 SIRS Respirations : 0 SIRS Pulse: 0 SIRS WBC: 1 SIRS Score Sum : 1 Assess: if the MEWS score is Yellow or Red Were vital signs accurate and taken at a resting state?: Yes Does the patient meet 2 or more of the SIRS criteria?: No MEWS guidelines implemented : Yes, yellow Treat MEWS Interventions: Considered administering scheduled or prn medications/treatments as ordered Take Vital Signs Increase Vital Sign Frequency : Yellow: Q2hr x1, continue Q4hrs until patient remains green for 12hrs Escalate MEWS: Escalate: Yellow: Discuss with charge nurse and consider notifying provider and/or RRT        Hargis LITTIE Donald 10/09/2024, 1:18 PM

## 2024-10-10 ENCOUNTER — Other Ambulatory Visit: Payer: Self-pay

## 2024-10-10 DIAGNOSIS — I214 Non-ST elevation (NSTEMI) myocardial infarction: Secondary | ICD-10-CM | POA: Diagnosis not present

## 2024-10-10 NOTE — Plan of Care (Signed)
" °  Problem: Education: Goal: Knowledge of General Education information will improve Description: Including pain rating scale, medication(s)/side effects and non-pharmacologic comfort measures 10/10/2024 1616 by Gladis Dania HERO, RN Outcome: Progressing 10/10/2024 1616 by Gladis Dania HERO, RN Outcome: Progressing   Problem: Health Behavior/Discharge Planning: Goal: Ability to manage health-related needs will improve 10/10/2024 1616 by Gladis Dania HERO, RN Outcome: Progressing 10/10/2024 1616 by Gladis Dania HERO, RN Outcome: Progressing   Problem: Clinical Measurements: Goal: Ability to maintain clinical measurements within normal limits will improve 10/10/2024 1616 by Gladis Dania HERO, RN Outcome: Progressing 10/10/2024 1616 by Gladis Dania HERO, RN Outcome: Progressing Goal: Will remain free from infection 10/10/2024 1616 by Gladis Dania HERO, RN Outcome: Progressing 10/10/2024 1616 by Gladis Dania HERO, RN Outcome: Progressing Goal: Diagnostic test results will improve 10/10/2024 1616 by Gladis Dania HERO, RN Outcome: Progressing 10/10/2024 1616 by Gladis Dania HERO, RN Outcome: Progressing Goal: Respiratory complications will improve 10/10/2024 1616 by Gladis Dania HERO, RN Outcome: Progressing 10/10/2024 1616 by Gladis Dania HERO, RN Outcome: Progressing Goal: Cardiovascular complication will be avoided 10/10/2024 1616 by Gladis Dania HERO, RN Outcome: Progressing 10/10/2024 1616 by Gladis Dania HERO, RN Outcome: Progressing   Problem: Activity: Goal: Risk for activity intolerance will decrease 10/10/2024 1616 by Gladis Dania HERO, RN Outcome: Progressing 10/10/2024 1616 by Gladis Dania HERO, RN Outcome: Progressing   Problem: Nutrition: Goal: Adequate nutrition will be maintained 10/10/2024 1616 by Gladis Dania HERO, RN Outcome: Progressing 10/10/2024 1616 by Gladis Dania HERO, RN Outcome: Progressing   Problem: Coping: Goal: Level of anxiety will decrease 10/10/2024 1616 by Gladis Dania HERO, RN Outcome:  Progressing 10/10/2024 1616 by Gladis Dania HERO, RN Outcome: Progressing   Problem: Elimination: Goal: Will not experience complications related to bowel motility 10/10/2024 1616 by Gladis Dania HERO, RN Outcome: Progressing 10/10/2024 1616 by Gladis Dania HERO, RN Outcome: Progressing Goal: Will not experience complications related to urinary retention 10/10/2024 1616 by Gladis Dania HERO, RN Outcome: Progressing 10/10/2024 1616 by Gladis Dania HERO, RN Outcome: Progressing   Problem: Pain Managment: Goal: General experience of comfort will improve and/or be controlled 10/10/2024 1616 by Gladis Dania HERO, RN Outcome: Progressing 10/10/2024 1616 by Gladis Dania HERO, RN Outcome: Progressing   Problem: Safety: Goal: Ability to remain free from injury will improve 10/10/2024 1616 by Gladis Dania HERO, RN Outcome: Progressing 10/10/2024 1616 by Gladis Dania HERO, RN Outcome: Progressing   Problem: Skin Integrity: Goal: Risk for impaired skin integrity will decrease 10/10/2024 1616 by Gladis Dania HERO, RN Outcome: Progressing 10/10/2024 1616 by Gladis Dania HERO, RN Outcome: Progressing   "

## 2024-10-10 NOTE — Progress Notes (Signed)
 Physical Therapy Treatment Patient Details Name: Kim Santiago MRN: [id number] DOB: 11-22-1934 Today's Date: 10/10/2024   History of Present Illness 89 y.o. admitted from [facility] on 10/08/24 with chest pain, vomiting; workup for NSTEMI; no intervention at this time. Of note, admit to [facility] 09/14/24 with R thalamic hemorrhagic stroke with midline shift; d/c to [facility] on 09/20/24. Other PMH includes recent admits for flu, PNA, L4 compression fx with d/c to SNF; HTN, CKD, Sjgren's syndrome, lupus, anxiety, depression, breast CA, osteoporosis, back sxs.    PT Comments  Pt progressing with mobility. Pt reports too fatigued to walk, but agreeable to exercise, tolerating multiple bouts of standing therex with CGA for balance; pt requiring frequent seated rest breaks secondary to c/o fatigue. Pt remains limited by generalized weakness, decreased activity tolerance, poor balance strategies/postural reactions and impaired cognition. Continue to recommend HHPT to maximize functional mobility and independence upon return home with family assist.  HR 80s-110s     If plan is discharge home, recommend the following: A little help with walking and/or transfers;A little help with bathing/dressing/bathroom;Assistance with cooking/housework;Direct supervision/assist for medications management;Direct supervision/assist for financial management;Assist for transportation;Help with stairs or ramp for entrance;Supervision due to cognitive status   Can travel by private vehicle     Yes  Equipment Recommendations   (all DME has been ordered on [facility]; confirmed with CM)    Recommendations for Other Services       Precautions / Restrictions Precautions Precautions: Fall Recall of Precautions/Restrictions: Impaired Precaution/Restrictions Comments: h/o L hemiparesis; verbal order per Dr. Odell Castor 10/11/23, ok to ambulate (pt states MD said to take it easy) Required Braces or Orthoses: Spinal Brace Spinal Brace: Lumbar  corset;Other (comment) Spinal Brace Comments: daughter says brace is for comfort, pt prefers not to wear (h/o L4 compression fx) Restrictions Weight Bearing Restrictions Per Provider Order: No     Mobility  Bed Mobility Overal bed mobility: Needs Assistance Bed Mobility: Supine to Sit, Sit to Supine     Supine to sit: Supervision, HOB elevated, Used rails Sit to supine: Supervision   General bed mobility comments: supine<>sit without assist, pt able to scoot herself up once supine    Transfers Overall transfer level: Needs assistance Equipment used: Rolling walker (2 wheels) Transfers: Sit to/from Stand Sit to Stand: Contact guard assist           General transfer comment: multiple sit<>stands from EOB to RW, improved sequencing and UE placement without cues; L hand falling off RW after multiple reps, pt self-correcting    Ambulation/Gait Ambulation/Gait assistance: Contact guard assist Gait Distance (Feet): 4 Feet Assistive device: Rolling walker (2 wheels)       Pre-gait activities: pt declines ambulation, agreeable to perform multiple bouts of marching in place and steps forwards/backwards/sideways at EOB with RW, CGA for balance; pt tolerating ~15-sec standing activity before requesting seated rest break     Stairs             Wheelchair Mobility     Tilt Bed    Modified Rankin (Stroke Patients Only)       Balance Overall balance assessment: Needs assistance Sitting-balance support: No upper extremity supported, Feet supported Sitting balance-Leahy Scale: Fair Sitting balance - Comments: self-corrected posterior lean and LOB with seated ADL tasks at EOB   Standing balance support: Single extremity supported, Bilateral upper extremity supported, During functional activity Standing balance-Leahy Scale: Poor  Communication Communication Communication: Impaired Factors Affecting Communication: Hearing  impaired (wears hearing aids)  Cognition Arousal: Alert Behavior During Therapy: WFL for tasks assessed/performed   PT - Cognitive impairments: History of cognitive impairments, Awareness, Memory, Attention, Initiation, Sequencing, Problem solving, Safety/Judgement                       PT - Cognition Comments: h/o multiple strokes with residual cognitive impairments. cognition initially seems in tact during conversation, but demonstrates memory, attention and awareness deficits. pt aware L arm and L leg do what they want with increased fatigue Following commands: Intact      Cueing Cueing Techniques: Verbal cues  Exercises Other Exercises Other Exercises: 5x + 5x repeated sit<>stands; standing marching with high knees; calf raises - pt tolerates ~15-sec bouts of standing activity before needing seated rest break    General Comments General comments (skin integrity, edema, etc.): HR 80s-110s with activity, pt requesting frequent seated rest breaks due to fatigue; denies nausea, reports recently having anti-nausea meds. daughter not present. pt reports feeling nervous about potential d/c home tomorrow since she hasn't been home in so long; reports no questions or concerns      Pertinent Vitals/Pain Pain Assessment Pain Assessment: Faces Pain Location: chest skin sore Pain Descriptors / Indicators: Discomfort, Grimacing Pain Intervention(s): Monitored during session, Limited activity within patient's tolerance    Home Living                          Prior Function            PT Goals (current goals can now be found in the care plan section) Progress towards PT goals: Progressing toward goals    Frequency    Min 2X/week      PT Plan      Co-evaluation              AM-PAC PT 6 Clicks Mobility   Outcome Measure  Help needed turning from your back to your side while in a flat bed without using bedrails?: A Little Help needed moving from  lying on your back to sitting on the side of a flat bed without using bedrails?: A Little Help needed moving to and from a bed to a chair (including a wheelchair)?: A Little Help needed standing up from a chair using your arms (e.g., wheelchair or bedside chair)?: A Little Help needed to walk in hospital room?: A Little Help needed climbing 3-5 steps with a railing? : A Lot 6 Click Score: 17    End of Session   Activity Tolerance: Patient tolerated treatment well;Patient limited by fatigue Patient left: in bed;with call bell/phone within reach;with bed alarm set Nurse Communication: Mobility status PT Visit Diagnosis: Difficulty in walking, not elsewhere classified (R26.2);Other abnormalities of gait and mobility (R26.89);Muscle weakness (generalized) (M62.81)     Time: 8662-8645 PT Time Calculation (min) (ACUTE ONLY): 17 min  Charges:    $Therapeutic Exercise: 8-22 mins PT General Charges $$ ACUTE PT VISIT: 1 Visit                      Darice Almas, PT, DPT Acute Rehabilitation Services  Personal: Secure Chat Rehab Office: [phone]  Darice LITTIE Almas 10/10/2024, 3:23 PM

## 2024-10-10 NOTE — Plan of Care (Signed)
   Problem: Activity: Goal: Risk for activity intolerance will decrease Outcome: Progressing   Problem: Pain Managment: Goal: General experience of comfort will improve and/or be controlled Outcome: Progressing   Problem: Safety: Goal: Ability to remain free from injury will improve Outcome: Progressing

## 2024-10-11 ENCOUNTER — Other Ambulatory Visit (HOSPITAL_COMMUNITY): Payer: Self-pay

## 2024-10-11 ENCOUNTER — Ambulatory Visit: Admitting: Physician Assistant

## 2024-10-11 ENCOUNTER — Other Ambulatory Visit

## 2024-10-11 DIAGNOSIS — I959 Hypotension, unspecified: Secondary | ICD-10-CM | POA: Insufficient documentation

## 2024-10-11 MED ORDER — ATORVASTATIN CALCIUM 40 MG PO TABS
40.0000 mg | ORAL_TABLET | Freq: Every day | ORAL | 0 refills | Status: AC
  Filled 2024-10-11: qty 90, 90d supply, fill #0

## 2024-10-11 MED ORDER — NITROGLYCERIN 0.4 MG SL SUBL
0.4000 mg | SUBLINGUAL_TABLET | SUBLINGUAL | 0 refills | Status: AC | PRN
  Filled 2024-10-11: qty 100, 36d supply, fill #0

## 2024-10-11 MED ORDER — ASPIRIN 81 MG PO TBEC
81.0000 mg | DELAYED_RELEASE_TABLET | Freq: Every day | ORAL | 12 refills | Status: AC
  Filled 2024-10-11: qty 30, 30d supply, fill #0

## 2024-10-11 NOTE — Progress Notes (Signed)
 Inpatient Rehab Admissions Coordinator:   Noted pt transferred from CIR to acute setting.  Therapies have evaluated pt and recommended d/c with [facility] which has been arranged.  No further needs for CIR at this time.  Please contact me with questions.   Reche Lowers, PT, DPT Admissions Coordinator [phone] 10/11/24 9:21 AM

## 2024-10-11 NOTE — TOC Transition Note (Signed)
 Transition of Care [facility]) - Discharge Note   Patient Details  Name: Kim Santiago MRN: [id number] Date of Birth: 12-Jun-1935  Transition of Care [facility]) CM/SW Contact:  Andrez JULIANNA George, RN Phone Number: 10/11/2024, 11:25 AM   Clinical Narrative:     Pt is discharging home with home health services through [place]. Information on the AVS.  Daughter to provide transportation home.  Final next level of care: Home w Home Health Services Barriers to Discharge: No Barriers Identified   Patient Goals and CMS Choice Patient states their goals for this hospitalization and ongoing recovery are:: Plan to return home with home health services CMS Medicare.gov Compare Post Acute Care list provided to:: Patient Represenative (must comment) Choice offered to / list presented to : Adult Children      Discharge Placement                       Discharge Plan and Services Additional resources added to the After Visit Summary for   In-house Referral: NA Discharge Planning Services: CM Consult Post Acute Care Choice: Home Health          DME Arranged: Hospital bed, Lightweight manual wheelchair with seat cushion, Bedside commode (drop arm)   Date DME Agency Contacted: 10/09/24 Time DME Agency Contacted: [id number] Representative spoke with at DME Agency: [facility] Arranged: RN, PT, OT, Nurse's Aide, Social Work [facility] Agency: [facility] Date [facility] Agency Contacted: 10/09/24 Time HH Agency Contacted: [phone] Representative spoke with at [facility] Agency: hub  Social Drivers of Health (SDOH) Interventions SDOH Screenings   Food Insecurity: No Food Insecurity (10/08/2024)  Housing: Low Risk (10/08/2024)  Transportation Needs: No Transportation Needs (10/08/2024)  Utilities: Not At Risk (10/08/2024)  Alcohol  Screen: Low Risk (04/18/2024)  Depression (PHQ2-9): High Risk (09/01/2024)  Financial Resource Strain: Low Risk (04/18/2024)  Physical Activity: Insufficiently Active (04/18/2024)  Social  Connections: Moderately Isolated (10/08/2024)  Stress: No Stress Concern Present (04/18/2024)  Tobacco Use: Low Risk (10/08/2024)  Health Literacy: Adequate Health Literacy (04/18/2024)     Readmission Risk Interventions     No data to display

## 2024-10-11 NOTE — Discharge Summary (Incomplete)
 Physician Discharge Summary  Patient ID: Kim Santiago MRN: [id number] DOB/AGE: May 21, 1935 89 y.o.  Admit date: 09/20/2024 Discharge date: 10/11/2024  Discharge Diagnoses:  Principal Problem:   Thalamic hemorrhage (HCC) Active Problems:   Benign essential HTN   History of Sjogren disease   Anemia of chronic disease   Hyponatremia   NSTEMI (non-ST elevated myocardial infarction) (HCC)   Impaired balance as late effect of cerebrovascular accident   Hx of systemic lupus erythematosus (SLE) (HCC)   Hypothyroidism   Lumbar compression fracture (HCC)   Hypotension   Discharged Condition: serious  Significant Diagnostic Studies:  DG CHEST PORT 1 VIEW Result Date: 10/08/2024 CLINICAL DATA:  Shortness of breath, myocardial infarction EXAM: PORTABLE CHEST 1 VIEW COMPARISON:  08/28/2024 FINDINGS: Single frontal view of the chest demonstrates a stable cardiac silhouette. Chronic eventration of the right hemidiaphragm. No acute airspace disease, effusion, or pneumothorax. No acute bony abnormality. IMPRESSION: 1. No acute intrathoracic process. Electronically Signed   By: Ozell Daring M.D.   On: 10/08/2024 20:24   DG Swallowing Func-Speech Pathology Result Date: 09/22/2024 Table formatting from the original result was not included. Modified Barium Swallow Study Patient Details Name: Kim Santiago MRN: [id number] Date of Birth: [birth year] Today's Date: 09/22/2024 HPI/PMH: HPI: Pt is a 89 y.o. female with medical history significant of HTN, hypothyroidism, HLD, Sjgren's syndrome, SLE, GERD, anxiety/depression, sent from nursing home for evaluation of possible stroke.     Patient was recently hospitalized 2x in Dec. 2025 for influenza A pneumonia complicated with CAP and a Fall w/ compression fracture discharged to nursing home for Rehab.  This morning, patient suddenly developed left-sided weakness around 10:30 AM, and patient sent to ED for further evaluation.  Patient denied any headache vision  problems no nauseous vomiting.   ED Course: Afebrile, nontachycardic blood pressure 120/40.  CT head showed hemorrhagic stroke on the right side of thalamic region with no significant mass effect or midline shift.  After discussion with intensivist and neurology, family decided to start patient on comfort measure.  Pt began improving in overall status; repeat MRI on 1/9 revealed: Right thalamic intraparenchymal hemorrhage, slightly decreased in size  compared to the prior study.  2. Similar degree of surrounding edema without significant midline shift.  3. No new or enlarging foci of intracranial hemorrhage.. Clinical Impression: Patient presents with a functional oropharyngeal swallow. Kim phase is characterized by reduced bolus prep/mastication resulting in moderately prolonged mastication of solids and requiring multiple swallows/liquid wash for clearance of Kim cavity. Pharyngeal phase is remarkable for occasional mistiming of epiglottic inversion/laryngeal vestibular closure resulting in transient penetration of thin liquids. Of note, this is considered WFL. No penetration/aspiration present with remaining consistencies of NTL, puree, solids and pill. Patient with complete clearance of pharyngeal space. During esophageal sweep, observed suspected slowed esophageal motility requiring additional bites of puree to clear pill into stomach. Recommend continuation of current diet (regular/thin) with medications whole in puree. Patient should utilize liquid wash and sit upright during and after meals. DIGEST Swallow Severity Rating*  Safety: 0  Efficiency:0  Overall Pharyngeal Swallow Severity: functional 1: mild; 2: moderate; 3: severe; 4: profound *The Dynamic Imaging Grade of Swallowing Toxicity is standardized for the head and neck cancer population, however, demonstrates promising clinical applications across populations to standardize the clinical rating of pharyngeal swallow safety and severity.  Recommendations/Plan: Swallowing Evaluation Recommendations Swallowing Evaluation Recommendations Recommendations: PO diet PO Diet Recommendation: Regular; Thin liquids (Level 0) Liquid Administration via: Cup; Straw Medication Administration: Whole  meds with puree Supervision: Patient able to self-feed; Intermittent supervision/cueing for swallowing strategies Swallowing strategies  : Minimize environmental distractions; Slow rate; Small bites/sips; Follow solids with liquids Postural changes: Position pt fully upright for meals; Stay upright 30-60 min after meals Kim care recommendations: Kim care BID (2x/day) Recommended consults: Consider esophageal assessment Treatment Plan Treatment Plan Treatment recommendations: Therapy as outlined in treatment plan below Follow-up recommendations: Outpatient SLP Recommendations Comment: n/a Functional status assessment: Patient has had a recent decline in their functional status and demonstrates the ability to make significant improvements in function in a reasonable and predictable amount of time. Treatment frequency: Min 2x/week Treatment duration: 2 weeks Interventions: Patient/family education; Compensatory techniques; Diet toleration management by SLP Recommendations Recommendations for follow up therapy are one component of a multi-disciplinary discharge planning process, led by the attending physician.  Recommendations may be updated based on patient status, additional functional criteria and insurance authorization. Assessment: Orofacial Exam: Orofacial Exam Kim Cavity: Kim Hygiene: WFL Kim Cavity - Dentition: Adequate natural dentition Orofacial Anatomy: WFL Kim Motor/Sensory Function: WFL Anatomy: Anatomy: Other (Comment); Suspected cervical osteophytes (suspected presence of diffuse calcifications) Boluses Administered: Boluses Administered Boluses Administered: Thin liquids (Level 0); Mildly thick liquids (Level 2, nectar thick); Puree; Solid  Kim  Impairment Domain: Kim Impairment Domain Lip Closure: No labial escape Tongue control during bolus hold: Cohesive bolus between tongue to palatal seal Bolus preparation/mastication: Slow prolonged chewing/mashing with complete recollection Bolus transport/lingual motion: Repetitive/disorganized tongue motion Kim residue: Complete Kim clearance (complete after multiple sponatenous swallows to clear) Initiation of pharyngeal swallow : Pyriform sinuses  Pharyngeal Impairment Domain: Pharyngeal Impairment Domain Soft palate elevation: No bolus between soft palate (SP)/pharyngeal wall (PW) Laryngeal elevation: Complete superior movement of thyroid  cartilage with complete approximation of arytenoids to epiglottic petiole Anterior hyoid excursion: Complete anterior movement Epiglottic movement: Complete inversion (occasional mistiming, though functional) Laryngeal vestibule closure: Complete, no air/contrast in laryngeal vestibule (occasional mistiming, though functional) Pharyngeal stripping wave : Present - complete Pharyngeal contraction (A/P view only): N/A Pharyngoesophageal segment opening: Complete distension and complete duration, no obstruction of flow Tongue base retraction: No contrast between tongue base and posterior pharyngeal wall (PPW) Pharyngeal residue: Complete pharyngeal clearance  Esophageal Impairment Domain: Esophageal Impairment Domain Esophageal clearance upright position: Esophageal retention Pill: Pill Consistency administered: Puree Puree: Impaired (see clinical impressions) Penetration/Aspiration Scale Score: Penetration/Aspiration Scale Score 1.  Material does not enter airway: Mildly thick liquids (Level 2, nectar thick); Puree; Solid; Pill 2.  Material enters airway, remains ABOVE vocal cords then ejected out: Thin liquids (Level 0) Compensatory Strategies: Compensatory Strategies Compensatory strategies: No   General Information: No data recorded Diet Prior to this Study: Regular; Thin  liquids (Level 0)   No data recorded  Respiratory Status: WFL   Supplemental O2: None (Room air)   No data recorded Behavior/Cognition: Alert; Pleasant mood; Cooperative No data recorded Baseline vocal quality/speech: Normal Volitional Cough: Able to elicit Volitional Swallow: Able to elicit Exam Limitations: No limitations Goal Planning: No data recorded No data recorded Barriers/Prognosis Comment: n/a Patient/Family Stated Goal: n/a Consulted and agree with results and recommendations: Patient Pain: No data recorded End of Session: Start Time:No data recorded Stop Time: No data recorded Time Calculation:No data recorded Charges: No data recorded SLP visit diagnosis: SLP Visit Diagnosis: Cognitive communication deficit (R41.841); Dysphagia, unspecified (R13.10) Past Medical History: Past Medical History: Diagnosis Date  Arthritis   Breast cancer (HCC) 2017  left mastectomy done 11/2015  Breast cancer in female [facility]) 11/18/2015  Left: 3.9  cm tumor, T2, 1/2 sentinel nodes positive for macro metastatic disease, N1, 3 negative nodes in the axillary tail, ER+,PR+, Her 2 neu, low Mammoprint score  Cancer (HCC)   thyroid  takes levothyroxine   CAP (community acquired pneumonia) 05/05/2021  Chronic kidney disease   UTI  Genetic screening 11/2015  Mammoprint of left breast cancer: Low risk for recurrence.   History of heart attack 06/12/2014  Hypertension   hypothyroidism   secondary to thyroidectomy for thyroid  ca  Hypothyroidism   Lupus   subcutaneous  Menopause 40s  natural, hot flashes and mood lability now gone, off prempro 7 months  Myocardial infarction [facility]) 2013  Osteoporosis   Osteopenia  Rosacea   Sjoegren syndrome   Stress-induced cardiomyopathy September of 2013  EF 35%. Peak troponin was 1.8.  Stroke [facility]) 10/2018 Past Surgical History: Past Surgical History: Procedure Laterality Date  BACK SURGERY    BREAST BIOPSY Left 10/30/15  positive, done in Dr. Fredirick office  CARDIAC CATHETERIZATION  05/2012  [facility]. No  significant CAD. Ejection fraction of 35% due to stress-induced cardiomyopathy.  CHOLECYSTECTOMY    COLONOSCOPY    DILATION AND CURETTAGE OF UTERUS    KYPHOSIS SURGERY  Feb 2008  L1, Dr. Kathi  LUMBAR DISC SURGERY    L4-L5  MASTECTOMY Left 11/18/2015  positive  SENTINEL NODE BIOPSY Left 11/18/2015  Procedure: SENTINEL NODE BIOPSY;  Surgeon: Reyes LELON Cota, MD;  Location: [facility] ORS;  Service: General;  Laterality: Left;  SHOULDER ARTHROSCOPY  2004  Left, Dr. Maryl  SIMPLE MASTECTOMY WITH AXILLARY SENTINEL NODE BIOPSY Left 11/18/2015  Procedure: SIMPLE MASTECTOMY;  Surgeon: Reyes LELON Cota, MD;  Location: [facility] ORS;  Service: General;  Laterality: Left;  SPINE SURGERY    L4-5 diskectomy  THYROIDECTOMY    Thyroid  Cancer  TONSILLECTOMY    TUBAL LIGATION   Kim Santiago M.A., CCC-SLP 09/22/2024, 12:40 PM  Labs:  Basic Metabolic Panel: Recent Labs  Lab 10/05/24 0505  NA 134*  K 4.3  CL 99  CO2 28  GLUCOSE 89  BUN 15  CREATININE 0.70  CALCIUM  8.6*    CBC: Recent Labs  Lab 10/05/24 0505  WBC 2.8*  NEUTROABS 1.1*  HGB 8.8*  HCT 25.5*  MCV 101.6*  PLT 100*    CBG: No results for input(s): GLUCAP in the last 168 hours.  Brief HPI:   Kim Santiago is a 89 y.o. female ***   Hospital Course: Kim Santiago was admitted to rehab 09/20/2024 for inpatient therapies to consist of PT, ST and OT at least three hours five days a week. Past admission physiatrist, therapy team and rehab RN have worked together to provide customized collaborative inpatient rehab.   Blood pressures were monitored on TID basis and   Diabetes has been monitored with ac/hs CBG checks and SSI was use prn for tighter BS control.    Rehab course: During patient's stay in rehab weekly team conferences were held to monitor patient's progress, set goals and discuss barriers to discharge. At admission, patient required  He/She  has had improvement in activity tolerance, balance, postural control as well as  ability to compensate for deficits. He/She has had improvement in functional use RUE/LUE  and RLE/LLE as well as improvement in awareness   Medications at discharge:       Discharge disposition: 02-Transferred to [facility]          Signed: Sharlet GORMAN Schmitz 10/11/2024, 7:28 PM

## 2024-10-11 NOTE — Progress Notes (Signed)
 Reviewed AVS, patient expressed understanding of medications, MD follow up reviewed.   Removed IV, Site clean, dry and intact.  See LDA for information on wounds at discharge. Patient states all belongings brought to the hospital at time of admission are accounted for and packed to take home.  Picked up medications from [facility] pharmacy. Nursing Staff contacted to transport patient to entrance A, where family member was waiting in vehicle to transport home.

## 2024-10-11 NOTE — Discharge Summary (Signed)
 Physician Discharge Summary  Kim Santiago FMW:[id number] DOB: 08-Mar-1935 DOA: 10/08/2024  PCP: Marylynn Verneita CROME, MD  Admit date: 10/08/2024 Discharge date: 10/11/2024  Admitted From: Inpatient rehab Disposition: Home  Recommendations for Outpatient Follow-up:  Follow up with PCP in 1-2 weeks  Home Health: Through [facility] Equipment/Devices: No new equipment  Discharge Condition: Stable CODE STATUS: DNR Diet recommendation: As tolerated regular diet  Brief/Interim Summary: Kim Santiago is an 89 y.o. female past medical history significant for hemorrhagic thalamic stroke on 8 January, initially the patient was comfort care however the family revoked to DNR which is limited, they wanted just conservative management and did not want a full stroke workup, Discharged to rehab on 09/20/2024, history of lupus, hypothyroidism and compression fractures who comes in for chest pain and vomiting was given sublingual nitro and IV morphine  and the pain improved.  Patient admitted as above with NSTEMI after recent discharge from our facility to inpatient rehab.  She was here previously for an acute hemorrhagic thalamic stroke as such heparin and dual antiplatelet therapy are an absolute contraindication.  Low-dose aspirin  as single agent is agreeable however.  At this time given patient's advanced age and limited intervention options family has agreed to discharge home with ongoing supportive care.  Would recommend ongoing close follow-up with palliative care given her advanced age, chronic comorbid conditions, as well as her limited ability to tolerate procedures as below.  She is at this time otherwise stable and agreeable for discharge home with family, she will resume home health through [facility] and is otherwise stable and agreeable for discharge home.    Discharge Diagnoses:  Principal Problem:   NSTEMI (non-ST elevated myocardial infarction) (HCC) Active Problems:   Hemorrhagic stroke (HCC)    Benign essential HTN   Thrombocytopenia   Hypothyroidism   Hx of systemic lupus erythematosus (SLE) (HCC)   Lumbar compression fracture (HCC)  NSTEMI (non-ST elevated myocardial infarction) : Recently discharged to rehab for an acute hemorrhagic thalamic stroke.  Initially comfort measures now DNR/DNI as the family reversed it. Neurology was curb sided IV heparin and DAPT therapy are absolutely contraindicated. On low-dose aspirin   2D echo showed an EF of 60% with new regional wall motion abnormality. Family did not want any aggressive intervention. Cardiology was consulted.  They deemed her not a candidate for intervention. She will not tolerate beta-blockers or GDMT due to hypotension. Continue conservative management. Outpatient follow-up per cardiology schedule and recommendations   Recent acute thalamic hemorrhagic CVA on 09/14/2024: Not a candidate for IV heparin or DAPT therapy. During that time the patient was made DNR/DNI but family did not want to pursue stroke workup at that time.  Currently family is agreeable for conservative treatments of NSTEMI as above.   Essential hypertension: Continue metoprolol  losartan . History of Sjogren Syndrome: Continue methotrexate  and prednisone . Hypothyroidism:  Continue Synthroid . Chronic thrombocytopenia: Stable Macrocytic anemia: Hemoglobin relatively stable. Leukopenia: Has remained afebrile continue to monitor. History of lumbar compression fracture: Patient did not tolerate her brace.  Discharge Instructions  Discharge Instructions     (HEART FAILURE PATIENTS) Call MD:  Anytime you have any of the following symptoms: 1) 3 pound weight gain in 24 hours or 5 pounds in 1 week 2) shortness of breath, with or without a dry hacking cough 3) swelling in the hands, feet or stomach 4) if you have to sleep on extra pillows at night in order to breathe.   Complete by: As directed    Call MD for:  difficulty breathing, headache or visual  disturbances   Complete by: As directed    Call MD for:  extreme fatigue   Complete by: As directed    Call MD for:  hives   Complete by: As directed    Call MD for:  persistant dizziness or light-headedness   Complete by: As directed    Call MD for:  persistant nausea and vomiting   Complete by: As directed    Call MD for:  severe uncontrolled pain   Complete by: As directed    Call MD for:  temperature >100.4   Complete by: As directed    Diet - low sodium heart healthy   Complete by: As directed    Increase activity slowly   Complete by: As directed       Allergies as of 10/11/2024       Reactions   Naprosyn [naproxen] Swelling   Amoxicillin  Rash   Tolerates first and third generation cephalosporins   Azathioprine  Nausea And Vomiting   Severe vomiting   Codeine Nausea And Vomiting   Hydroxychloroquine Hives, Nausea And Vomiting   Mycophenolate Mofetil Nausea Only   Orudis [ketoprofen] Hives   Sulfa Antibiotics Rash   Sulfathiazole Rash        Medication List     STOP taking these medications    fluticasone  50 MCG/ACT nasal spray Commonly known as: FLONASE    furosemide  20 MG tablet Commonly known as: LASIX    losartan  25 MG tablet Commonly known as: COZAAR    metoprolol  succinate 25 MG 24 hr tablet Commonly known as: TOPROL -XL   oxyCODONE  5 MG immediate release tablet Commonly known as: Oxy IR/ROXICODONE    Sodium Fluoride 1.1 % Pste   triamcinolone  cream 0.1 % Commonly known as: KENALOG        TAKE these medications    acetaminophen  325 MG tablet Commonly known as: TYLENOL  Take 2 tablets (650 mg total) by mouth every 6 (six) hours as needed for fever (Fever >/= 101). What changed: Another medication with the same name was removed. Continue taking this medication, and follow the directions you see here.   artificial tears Oint ophthalmic ointment Commonly known as: LACRILUBE Place into both eyes at bedtime.   artificial tears ophthalmic  solution Place 1 drop into both eyes 4 (four) times daily as needed for dry eyes.   aspirin  EC 81 MG tablet Take 1 tablet (81 mg total) by mouth daily. Swallow whole. Start taking on: October 12, 2024   atorvastatin  40 MG tablet Commonly known as: LIPITOR Take 1 tablet (40 mg total) by mouth daily. Start taking on: October 12, 2024 What changed:  medication strength See the new instructions.   Calcium  Carbonate-Vitamin D  600-200 MG-UNIT Tabs Take 2 tablets by mouth daily.   CRANBERRY PO Take 1 capsule by mouth 2 (two) times daily.   feeding supplement Liqd Take 237 mLs by mouth 2 (two) times daily between meals.   folic acid  1 MG tablet Commonly known as: FOLVITE  Take 3 mg by mouth daily.   levothyroxine  75 MCG tablet Commonly known as: SYNTHROID  TAKE 1 TABLET BY MOUTH ( TOTAL) DAILY   meclizine  12.5 MG tablet Commonly known as: ANTIVERT  Take 1 tablet (12.5 mg total) by mouth 3 (three) times daily as needed for dizziness or nausea.   methotrexate  2.5 MG tablet Commonly known as: RHEUMATREX Take 20 mg by mouth once a week. Caution:Chemotherapy. Protect from light.   nitroGLYCERIN  0.4 MG SL tablet Commonly known as: NITROSTAT  Place  1 tablet (0.4 mg total) under the tongue every 5 (five) minutes as needed for chest pain.   omeprazole  20 MG capsule Commonly known as: PRILOSEC TAKE 1 CAPSULE (20 MG TOTAL) BY MOUTH 2 (TWO) TIMES DAILY BEFORE A MEAL.   ondansetron  4 MG tablet Commonly known as: ZOFRAN  Take 4 mg by mouth every 6 (six) hours as needed for nausea or vomiting.   polyethylene glycol 17 g packet Commonly known as: MIRALAX  / GLYCOLAX  Take 17 g by mouth daily.   predniSONE  2.5 MG tablet Commonly known as: DELTASONE  Take 2.5 mg by mouth daily.   PROBIOTIC PO Take 1 tablet by mouth daily.   sertraline  25 MG tablet Commonly known as: ZOLOFT  TAKE 1 TABLET (25 MG TOTAL) BY MOUTH DAILY.   witch hazel-glycerin  pad Commonly known as: TUCKS Apply  topically as needed for itching.        Contact information for follow-up providers     Loistine Sober, NP Follow up.   Specialty: Cardiology Why: Hospital follow-up with Cardiology scheduled for 10/20/2024 at 10:30am. Please arrive 15 mintues early for check-in. If this date/ time does not work for you, please call our office to reschedule. Contact information: [address] [place] [address] [phone]              Contact information for after-discharge care     Home Medical Care     [facility]) .   Service: Home Health Services Why: Physical/Occupational Therapy/Aide-office to call with visit times. Contact information: [address] [place]  [address] [phone]                    Allergies[1]  Consultations: Cardiology   Procedures/Studies: ECHOCARDIOGRAM COMPLETE Result Date: 10/09/2024    ECHOCARDIOGRAM REPORT   Patient Name:   Kim Santiago Date of Exam: 10/09/2024 Medical Rec #:  [id number]        Height:       63.0 in Accession #:    [id number]       Weight:       121.7 lb Date of Birth:  1934-10-31        BSA:          1.566 m Patient Age:    89 years         BP:           87/41 mmHg Patient Gender: F                HR:           79 bpm. Exam Location:  Inpatient Procedure: 2D Echo, Color Doppler, Cardiac Doppler and Intracardiac            Opacification Agent (Both Spectral and Color Flow Doppler were            utilized during procedure). Indications:    NSTEMI I21.4  History:        Patient has prior history of Echocardiogram examinations, most                 recent 05/10/2021. Stroke; Risk Factors:Hypertension.  Sonographer:    Sydnee Wilson RDCS Referring Phys: [phone] SUBRINA SUNDIL IMPRESSIONS  1. Distal septum, anterior wall, and apex are akinetic. Left ventricular ejection fraction, by estimation, is 60 to 65%. The left ventricle has normal function. The left ventricle  demonstrates regional wall motion abnormalities (see scoring diagram/findings for description). Left ventricular diastolic parameters are consistent  with Grade I diastolic dysfunction (impaired relaxation).  2. Right ventricular systolic function is normal. The right ventricular size is normal.  3. The mitral valve is normal in structure. Mild to moderate mitral valve regurgitation. No evidence of mitral stenosis.  4. The aortic valve is tricuspid. Aortic valve regurgitation is mild. No aortic stenosis is present.  5. The inferior vena cava is normal in size with greater than 50% respiratory variability, suggesting right atrial pressure of 3 mmHg. FINDINGS  Left Ventricle: Distal septum, anterior wall, and apex are akinetic. Left ventricular ejection fraction, by estimation, is 60 to 65%. The left ventricle has normal function. The left ventricle demonstrates regional wall motion abnormalities. Definity  contrast agent was given IV to delineate the left ventricular endocardial borders. The left ventricular internal cavity size was normal in size. There is no left ventricular hypertrophy. Left ventricular diastolic parameters are consistent with Grade I diastolic dysfunction (impaired relaxation).  LV Wall Scoring: The mid and distal anterior septum, apical anterior segment, and apex are akinetic. Right Ventricle: The right ventricular size is normal. No increase in right ventricular wall thickness. Right ventricular systolic function is normal. Left Atrium: Left atrial size was normal in size. Right Atrium: Right atrial size was normal in size. Pericardium: There is no evidence of pericardial effusion. Mitral Valve: The mitral valve is normal in structure. Mild to moderate mitral valve regurgitation. No evidence of mitral valve stenosis. Tricuspid Valve: The tricuspid valve is normal in structure. Tricuspid valve regurgitation is not demonstrated. No evidence of tricuspid stenosis. Aortic Valve: The aortic valve is  tricuspid. Aortic valve regurgitation is mild. No aortic stenosis is present. Pulmonic Valve: The pulmonic valve was normal in structure. Pulmonic valve regurgitation is not visualized. No evidence of pulmonic stenosis. Aorta: The aortic root is normal in size and structure. Venous: The inferior vena cava is normal in size with greater than 50% respiratory variability, suggesting right atrial pressure of 3 mmHg. IAS/Shunts: No atrial level shunt detected by color flow Doppler.  LEFT VENTRICLE PLAX 2D LVIDd:         2.90 cm     Diastology LVIDs:         1.90 cm     LV e' medial:    5.22 cm/s LV PW:         1.10 cm     LV E/e' medial:  11.4 LV IVS:        0.60 cm     LV e' lateral:   6.09 cm/s LVOT diam:     1.90 cm     LV E/e' lateral: 9.8 LV SV:         58 LV SV Index:   37 LVOT Area:     2.84 cm  LV Volumes (MOD) LV vol d, MOD A2C: 57.7 ml LV vol d, MOD A4C: 48.2 ml LV vol s, MOD A2C: 20.4 ml LV vol s, MOD A4C: 21.5 ml LV SV MOD A2C:     37.3 ml LV SV MOD A4C:     48.2 ml LV SV MOD BP:      32.7 ml RIGHT VENTRICLE RV S prime:     11.00 cm/s TAPSE (M-mode): 2.0 cm LEFT ATRIUM             Index        RIGHT ATRIUM          Index LA diam:        2.10 cm 1.34 cm/m   RA  Area:     5.87 cm LA Vol (A2C):   24.2 ml 15.46 ml/m  RA Volume:   6.78 ml  4.33 ml/m LA Vol (A4C):   11.1 ml 7.09 ml/m LA Biplane Vol: 17.4 ml 11.11 ml/m  AORTIC VALVE LVOT Vmax:   97.40 cm/s LVOT Vmean:  65.900 cm/s LVOT VTI:    0.205 m  AORTA Ao Root diam: 3.20 cm Ao Asc diam:  2.90 cm MITRAL VALVE MV Area (PHT): 2.74 cm    SHUNTS MV Decel Time: 277 msec    Systemic VTI:  0.20 m MV E velocity: 59.70 cm/s  Systemic Diam: 1.90 cm MV A velocity: 89.60 cm/s MV E/A ratio:  0.67 Morene Brownie Electronically signed by Morene Brownie Signature Date/Time: 10/09/2024/12:29:04 PM    Final    DG CHEST PORT 1 VIEW Result Date: 10/08/2024 CLINICAL DATA:  Shortness of breath, myocardial infarction EXAM: PORTABLE CHEST 1 VIEW COMPARISON:  08/28/2024  FINDINGS: Single frontal view of the chest demonstrates a stable cardiac silhouette. Chronic eventration of the right hemidiaphragm. No acute airspace disease, effusion, or pneumothorax. No acute bony abnormality. IMPRESSION: 1. No acute intrathoracic process. Electronically Signed   By: Ozell Daring M.D.   On: 10/08/2024 20:24   DG Swallowing Func-Speech Pathology Result Date: 09/22/2024 Table formatting from the original result was not included. Modified Barium Swallow Study Patient Details Name: Kim Santiago MRN: [id number] Date of Birth: August 16, 1935 Today's Date: 09/22/2024 HPI/PMH: HPI: Pt is a 89 y.o. female with medical history significant of HTN, hypothyroidism, HLD, Sjgren's syndrome, SLE, GERD, anxiety/depression, sent from nursing home for evaluation of possible stroke.     Patient was recently hospitalized 2x in Dec. 2025 for influenza A pneumonia complicated with CAP and a Fall w/ compression fracture discharged to nursing home for Rehab.  This morning, patient suddenly developed left-sided weakness around 10:30 AM, and patient sent to ED for further evaluation.  Patient denied any headache vision problems no nauseous vomiting.   ED Course: Afebrile, nontachycardic blood pressure 120/40.  CT head showed hemorrhagic stroke on the right side of thalamic region with no significant mass effect or midline shift.  After discussion with intensivist and neurology, family decided to start patient on comfort measure.  Pt began improving in overall status; repeat MRI on 1/9 revealed: Right thalamic intraparenchymal hemorrhage, slightly decreased in size  compared to the prior study.  2. Similar degree of surrounding edema without significant midline shift.  3. No new or enlarging foci of intracranial hemorrhage.. Clinical Impression: Patient presents with a functional oropharyngeal swallow. Oral phase is characterized by reduced bolus prep/mastication resulting in moderately prolonged mastication of solids  and requiring multiple swallows/liquid wash for clearance of oral cavity. Pharyngeal phase is remarkable for occasional mistiming of epiglottic inversion/laryngeal vestibular closure resulting in transient penetration of thin liquids. Of note, this is considered WFL. No penetration/aspiration present with remaining consistencies of NTL, puree, solids and pill. Patient with complete clearance of pharyngeal space. During esophageal sweep, observed suspected slowed esophageal motility requiring additional bites of puree to clear pill into stomach. Recommend continuation of current diet (regular/thin) with medications whole in puree. Patient should utilize liquid wash and sit upright during and after meals. DIGEST Swallow Severity Rating*  Safety: 0  Efficiency:0  Overall Pharyngeal Swallow Severity: functional 1: mild; 2: moderate; 3: severe; 4: profound *The Dynamic Imaging Grade of Swallowing Toxicity is standardized for the head and neck cancer population, however, demonstrates promising clinical applications across populations to standardize the  clinical rating of pharyngeal swallow safety and severity. Recommendations/Plan: Swallowing Evaluation Recommendations Swallowing Evaluation Recommendations Recommendations: PO diet PO Diet Recommendation: Regular; Thin liquids (Level 0) Liquid Administration via: Cup; Straw Medication Administration: Whole meds with puree Supervision: Patient able to self-feed; Intermittent supervision/cueing for swallowing strategies Swallowing strategies  : Minimize environmental distractions; Slow rate; Small bites/sips; Follow solids with liquids Postural changes: Position pt fully upright for meals; Stay upright 30-60 min after meals Oral care recommendations: Oral care BID (2x/day) Recommended consults: Consider esophageal assessment Treatment Plan Treatment Plan Treatment recommendations: Therapy as outlined in treatment plan below Follow-up recommendations: Outpatient SLP  Recommendations Comment: n/a Functional status assessment: Patient has had a recent decline in their functional status and demonstrates the ability to make significant improvements in function in a reasonable and predictable amount of time. Treatment frequency: Min 2x/week Treatment duration: 2 weeks Interventions: Patient/family education; Compensatory techniques; Diet toleration management by SLP Recommendations Recommendations for follow up therapy are one component of a multi-disciplinary discharge planning process, led by the attending physician.  Recommendations may be updated based on patient status, additional functional criteria and insurance authorization. Assessment: Orofacial Exam: Orofacial Exam Oral Cavity: Oral Hygiene: WFL Oral Cavity - Dentition: Adequate natural dentition Orofacial Anatomy: WFL Oral Motor/Sensory Function: WFL Anatomy: Anatomy: Other (Comment); Suspected cervical osteophytes (suspected presence of diffuse calcifications) Boluses Administered: Boluses Administered Boluses Administered: Thin liquids (Level 0); Mildly thick liquids (Level 2, nectar thick); Puree; Solid  Oral Impairment Domain: Oral Impairment Domain Lip Closure: No labial escape Tongue control during bolus hold: Cohesive bolus between tongue to palatal seal Bolus preparation/mastication: Slow prolonged chewing/mashing with complete recollection Bolus transport/lingual motion: Repetitive/disorganized tongue motion Oral residue: Complete oral clearance (complete after multiple sponatenous swallows to clear) Initiation of pharyngeal swallow : Pyriform sinuses  Pharyngeal Impairment Domain: Pharyngeal Impairment Domain Soft palate elevation: No bolus between soft palate (SP)/pharyngeal wall (PW) Laryngeal elevation: Complete superior movement of thyroid  cartilage with complete approximation of arytenoids to epiglottic petiole Anterior hyoid excursion: Complete anterior movement Epiglottic movement: Complete inversion  (occasional mistiming, though functional) Laryngeal vestibule closure: Complete, no air/contrast in laryngeal vestibule (occasional mistiming, though functional) Pharyngeal stripping wave : Present - complete Pharyngeal contraction (A/P view only): N/A Pharyngoesophageal segment opening: Complete distension and complete duration, no obstruction of flow Tongue base retraction: No contrast between tongue base and posterior pharyngeal wall (PPW) Pharyngeal residue: Complete pharyngeal clearance  Esophageal Impairment Domain: Esophageal Impairment Domain Esophageal clearance upright position: Esophageal retention Pill: Pill Consistency administered: Puree Puree: Impaired (see clinical impressions) Penetration/Aspiration Scale Score: Penetration/Aspiration Scale Score 1.  Material does not enter airway: Mildly thick liquids (Level 2, nectar thick); Puree; Solid; Pill 2.  Material enters airway, remains ABOVE vocal cords then ejected out: Thin liquids (Level 0) Compensatory Strategies: Compensatory Strategies Compensatory strategies: No   General Information: No data recorded Diet Prior to this Study: Regular; Thin liquids (Level 0)   No data recorded  Respiratory Status: WFL   Supplemental O2: None (Room air)   No data recorded Behavior/Cognition: Alert; Pleasant mood; Cooperative No data recorded Baseline vocal quality/speech: Normal Volitional Cough: Able to elicit Volitional Swallow: Able to elicit Exam Limitations: No limitations Goal Planning: No data recorded No data recorded Barriers/Prognosis Comment: n/a Patient/Family Stated Goal: n/a Consulted and agree with results and recommendations: Patient Pain: No data recorded End of Session: Start Time:No data recorded Stop Time: No data recorded Time Calculation:No data recorded Charges: No data recorded SLP visit diagnosis: SLP Visit Diagnosis: Cognitive communication deficit (R41.841);  Dysphagia, unspecified (R13.10) Past Medical History: Past Medical History:  Diagnosis Date  Arthritis   Breast cancer (HCC) 2017  left mastectomy done 11/2015  Breast cancer in female [facility]) 11/18/2015  Left: 3.9 cm tumor, T2, 1/2 sentinel nodes positive for macro metastatic disease, N1, 3 negative nodes in the axillary tail, ER+,PR+, Her 2 neu, low Mammoprint score  Cancer (HCC)   thyroid  takes levothyroxine   CAP (community acquired pneumonia) 05/05/2021  Chronic kidney disease   UTI  Genetic screening 11/2015  Mammoprint of left breast cancer: Low risk for recurrence.   History of heart attack 06/12/2014  Hypertension   hypothyroidism   secondary to thyroidectomy for thyroid  ca  Hypothyroidism   Lupus   subcutaneous  Menopause 40s  natural, hot flashes and mood lability now gone, off prempro 7 months  Myocardial infarction [facility]) 2013  Osteoporosis   Osteopenia  Rosacea   Sjoegren syndrome   Stress-induced cardiomyopathy September of 2013  EF 35%. Peak troponin was 1.8.  Stroke [facility]) 10/2018 Past Surgical History: Past Surgical History: Procedure Laterality Date  BACK SURGERY    BREAST BIOPSY Left 10/30/15  positive, done in Dr. Fredirick office  CARDIAC CATHETERIZATION  05/2012  [facility]. No significant CAD. Ejection fraction of 35% due to stress-induced cardiomyopathy.  CHOLECYSTECTOMY    COLONOSCOPY    DILATION AND CURETTAGE OF UTERUS    KYPHOSIS SURGERY  Feb 2008  L1, Dr. Kathi  LUMBAR DISC SURGERY    L4-L5  MASTECTOMY Left 11/18/2015  positive  SENTINEL NODE BIOPSY Left 11/18/2015  Procedure: SENTINEL NODE BIOPSY;  Surgeon: Reyes LELON Cota, MD;  Location: [facility] ORS;  Service: General;  Laterality: Left;  SHOULDER ARTHROSCOPY  2004  Left, Dr. Maryl  SIMPLE MASTECTOMY WITH AXILLARY SENTINEL NODE BIOPSY Left 11/18/2015  Procedure: SIMPLE MASTECTOMY;  Surgeon: Reyes LELON Cota, MD;  Location: [facility] ORS;  Service: General;  Laterality: Left;  SPINE SURGERY    L4-5 diskectomy  THYROIDECTOMY    Thyroid  Cancer  TONSILLECTOMY    TUBAL LIGATION   Kim Santiago M.A., CCC-SLP 09/22/2024, 12:40  PM  CT HEAD WO CONTRAST ( ) Result Date: 09/15/2024 EXAM: CT HEAD WITHOUT CONTRAST 09/15/2024 04:25:18 PM TECHNIQUE: CT of the head was performed without the administration of intravenous contrast. Automated exposure control, iterative reconstruction, and/or weight based adjustment of the mA/kV was utilized to reduce the radiation dose to as low as reasonably achievable. COMPARISON: 09/14/2024 CLINICAL HISTORY: Stroke, follow up. FINDINGS: BRAIN AND VENTRICLES: Right thalamic intraparenchymal hemorrhage measures 1.7 x 1.1 x 1.0 cm on the current study, previously measuring 2.0 x 1.4 x 1.2 cm. Similar degree of surrounding edema. There is no significant midline shift. Similar appearance of the ventricles. The basilar cisterns are patent. No new or enlarging foci of intracranial hemorrhage. Similar moderate chronic microvascular ischemic changes. Similar parenchymal volume loss. Calcific atherosclerosis. No evidence of acute infarct. No hydrocephalus. No extra-axial collection. No mass effect. ORBITS: No acute abnormality. SINUSES: No acute abnormality. SOFT TISSUES AND SKULL: No acute soft tissue abnormality. No skull fracture. IMPRESSION: 1. Right thalamic intraparenchymal hemorrhage, slightly decreased in size compared to the prior study. 2. Similar degree of surrounding edema without significant midline shift. 3. No new or enlarging foci of intracranial hemorrhage. Electronically signed by: Donnice Mania MD MD 09/15/2024 04:50 PM EST RP Workstation: [id number]   CT HEAD CODE STROKE WO CONTRAST Result Date: 09/14/2024 EXAM: CT HEAD WITHOUT CONTRAST 09/14/2024 11:48:52 AM TECHNIQUE: CT of the head was performed without the administration of intravenous contrast. Automated  exposure control, iterative reconstruction, and/or weight based adjustment of the mA/kV was utilized to reduce the radiation dose to as low as reasonably achievable. COMPARISON: Brain MRI 11/07/2018 and CT head 08/28/2024. CLINICAL HISTORY:  89 year old female. Increasing left side weakness, superimposed on residual left side deficit from stroke. FINDINGS: BRAIN AND VENTRICLES: Hyperdense acute hemorrhage in the right deep gray nuclei centered along the anterolateral thalamus near the posterior limb of the right internal capsule (series 3 image 16). Blood products are biconvex, measuring 20 x 14 x 12 mm, estimated volume 2 mL. Mild regional edema. No significant intracranial mass effect, no midline shift. Clinically advanced cerebral white matter hypodensity elsewhere appears stable. No other acute intracranial hemorrhage. Calcified atherosclerosis at the skull base. No suspicious intracranial vascular hyperdensity. No hydrocephalus. No extra-axial collection. ORBITS: No acute abnormality. SINUSES: Paranasal sinuses, tympanic cavities and mastoids remain well aerated. SOFT TISSUES AND SKULL: No acute soft tissue abnormality. No skull fracture. IMPRESSION: 1. Acute Right thalamic hemorrhage (estimated 2 mL) with mild edema. No significant mass effect, no midline shift. No other acute intracranial abnormality. 2. These results were communicated to Dr. Lindzen at 1158 hours on 09/14/2024 by text page via the [facility] messaging system. Electronically signed by: Helayne Hurst MD MD 09/14/2024 11:59 AM EST RP Workstation: [id number]   CT ANKLE LEFT WO CONTRAST Result Date: 09/12/2024 CLINICAL DATA:  Ankle pain. EXAM: CT OF THE LEFT ANKLE WITHOUT CONTRAST TECHNIQUE: Multidetector CT imaging of the left ankle was performed according to the standard protocol. Multiplanar CT image reconstructions were also generated. RADIATION DOSE REDUCTION: This exam was performed according to the departmental dose-optimization program which includes automated exposure control, adjustment of the mA and/or kV according to patient size and/or use of iterative reconstruction technique. COMPARISON:  Left ankle radiographs dated 09/11/2024. FINDINGS: Bones/Joint/Cartilage Diffuse osseous  demineralization. No acute fracture or dislocation. Ankle mortise appears congruent. No sizable tibiotalar joint effusion. Calcaneal enthesopathy at the insertion of the Achilles tendon. Plantar calcaneal spur. Minimal degenerative changes of the ankle and hindfoot. Ligaments Ligaments are suboptimally evaluated by CT. Muscles and Tendons Fusiform thickening of the distal Achilles tendon is suggestive of tendinosis. Thickening of the central cord of the plantar fascia, acute plantar fasciitis cannot be excluded. Generalized muscular atrophy. Soft tissue No fluid collection or hematoma.  Subcutaneous edema of the heel. IMPRESSION: 1. No acute osseous abnormality. 2. Calcaneal enthesopathy with thickening of the distal Achilles tendon, suggestive of tendinosis. 3. Plantar calcaneal spur with thickening of the central cord of the plantar fascia. Acute plantar fasciitis cannot be excluded. 4. Subcutaneous edema of the heel. Electronically Signed   By: Harrietta Sherry M.D.   On: 09/12/2024 10:30     Subjective: No acute issues or events overnight denies nausea vomiting diarrhea constipation any fevers chills chest pain   Discharge Exam: Vitals:   10/11/24 0358 10/11/24 0740  BP: (!) 113/56 (!) 117/54  Pulse:  81  Resp: 14 13  Temp: 98 F (36.7 C) 97.7 F (36.5 C)  SpO2: 99% 99%   Vitals:   10/10/24 1950 10/10/24 2337 10/11/24 0358 10/11/24 0740  BP: (!) 102/47 (!) 113/58 (!) 113/56 (!) 117/54  Pulse:  79  81  Resp: 18 14 14 13   Temp: 97.6 F (36.4 C) 98.2 F (36.8 C) 98 F (36.7 C) 97.7 F (36.5 C)  TempSrc: Oral Oral Oral Oral  SpO2: 98%  99% 99%  Weight:      Height:        General: Pt  is alert, awake, not in acute distress Cardiovascular: RRR, S1/S2 +, no rubs, no gallops Respiratory: CTA bilaterally, no wheezing, no rhonchi Abdominal: Soft, NT, ND, bowel sounds + Extremities: no edema, no cyanosis    The results of significant diagnostics from this hospitalization (including  imaging, microbiology, ancillary and laboratory) are listed below for reference.     Microbiology: No results found for this or any previous visit (from the past 240 hours).   Labs: BNP (last 3 results) No results for input(s): BNP in the last 8760 hours. Basic Metabolic Panel: Recent Labs  Lab 10/05/24 0505 10/08/24 2023 10/09/24 0237  NA 134* 131* 132*  K 4.3 4.7 4.5  CL 99 95* 96*  CO2 28 29 26   GLUCOSE 89 102* 86  BUN 15 13 14   CREATININE 0.70 0.69 0.72  CALCIUM  8.6* 8.8* 8.4*    CBC: Recent Labs  Lab 10/05/24 0505 10/08/24 2023 10/09/24 0237  WBC 2.8* 3.3* 3.0*  NEUTROABS 1.1* 1.9  --   HGB 8.8* 9.2* 8.4*  HCT 25.5* 26.9* 24.4*  MCV 101.6* 103.1* 103.0*  PLT 100* 116* 101*   Urinalysis    Component Value Date/Time   COLORURINE YELLOW (A) 09/06/2024 0621   APPEARANCEUR HAZY (A) 09/06/2024 0621   APPEARANCEUR Slightly cloudy 05/11/2024 1108   LABSPEC 1.012 09/06/2024 0621   PHURINE 7.0 09/06/2024 0621   GLUCOSEU NEGATIVE 09/06/2024 0621   GLUCOSEU NEGATIVE 05/11/2017 1143   HGBUR NEGATIVE 09/06/2024 0621   BILIRUBINUR NEGATIVE 09/06/2024 0621   BILIRUBINUR Negative 05/11/2024 1108   KETONESUR NEGATIVE 09/06/2024 0621   PROTEINUR NEGATIVE 09/06/2024 0621   UROBILINOGEN 0.2 07/04/2018 1134   UROBILINOGEN 0.2 05/11/2017 1143   NITRITE NEGATIVE 09/06/2024 0621   LEUKOCYTESUR SMALL (A) 09/06/2024 0621   Sepsis Labs Recent Labs  Lab 10/05/24 0505 10/08/24 2023 10/09/24 0237  WBC 2.8* 3.3* 3.0*   Microbiology No results found for this or any previous visit (from the past 240 hours).   Time coordinating discharge: Over 30 minutes  SIGNED:   Elsie JAYSON [place], DO [facility] 10/11/2024, 12:27 PM Pager   If 7PM-7AM, please contact night-coverage [email or web address]     [1]  Allergies Allergen Reactions   Naprosyn [Naproxen] Swelling   Amoxicillin  Rash    Tolerates first and third generation cephalosporins   Azathioprine  Nausea And  Vomiting    Severe vomiting   Codeine Nausea And Vomiting   Hydroxychloroquine Hives and Nausea And Vomiting   Mycophenolate Mofetil Nausea Only   Orudis [Ketoprofen] Hives   Sulfa Antibiotics Rash   Sulfathiazole Rash

## 2024-10-12 ENCOUNTER — Telehealth: Payer: Self-pay | Admitting: *Deleted

## 2024-10-12 ENCOUNTER — Telehealth: Payer: Self-pay

## 2024-10-12 DIAGNOSIS — I214 Non-ST elevation (NSTEMI) myocardial infarction: Secondary | ICD-10-CM

## 2024-10-12 NOTE — Transitions of Care (Post Inpatient/ED Visit) (Signed)
 "  10/12/2024  Name: Kim Santiago MRN: [id number] DOB: 1934-09-11  Today's TOC FU Call Status: Today's TOC FU Call Status:: Successful TOC FU Call Completed TOC FU Call Complete Date: 10/12/24  Patient's Name and Date of Birth confirmed. Name, DOB Kim Santiago (DPR/daughter))  Transition Care Management Follow-up Telephone Call Date of Discharge: 10/11/24 [facility]charge [facility]) Type of Discharge: Inpatient Admission Primary Inpatient Discharge Diagnosis:: NSTEMI (non-ST elevated myocardial infarction) How have you been since you were released from the hospital?: Same Any questions or concerns?: No  Items Reviewed: Did you receive and understand the discharge instructions provided?: Yes Medications obtained,verified, and reconciled?: Yes (Medications Reviewed) Any new allergies since your discharge?: No Dietary orders reviewed?: Yes Type of Diet Ordered:: As tolerated regular and nutrional supplements (ensure) Do you have support at home?: Yes People in Home [RPT]: child(ren), adult Name of Support/Comfort Primary Source: Kim Santiago (daughter/dpr)  Medications Reviewed Today: Medications Reviewed Today     Reviewed by Alvia Olam BIRCH, RN (Registered Nurse) on 10/12/24 at 0945  Med List Status: <None>   Medication Order Taking? Sig Documenting Provider Last Dose Status Informant  acetaminophen  (TYLENOL ) 325 MG tablet [id number] Yes Take 2 tablets (650 mg total) by mouth every 6 (six) hours as needed for fever (Fever >/= 101). Josette Ade, MD  Active Child           Med Note (CRUTHIS, SHEFFIELD BROCKS   Wed Sep 20, 2024  1:22 PM) Medication started during [place] admission on 09/14/24-09/20/24  artificial tears (LACRILUBE) OINT ophthalmic ointment [id number] Yes Place into both eyes at bedtime. Maurice Sharlet RAMAN, PA-C  Active   artificial tears ophthalmic solution [id number] Yes Place 1 drop into both eyes 4 (four) times daily as needed for dry eyes. Josette Ade,  MD  Active Child           Med Note (CRUTHIS, SHEFFIELD BROCKS   Wed Sep 20, 2024  1:22 PM) Medication started during [place] admission on 09/14/24-09/20/24  aspirin  EC 81 MG tablet [id number] Yes Take 1 tablet (81 mg total) by mouth daily. Swallow whole. Lue Elsie BROCKS, MD  Active   atorvastatin  (LIPITOR) 40 MG tablet [id number] Yes Take 1 tablet (40 mg total) by mouth daily. Lue Elsie BROCKS, MD  Active   Calcium  Carbonate-Vitamin D  600-200 MG-UNIT TABS [id number] Yes Take 2 tablets by mouth daily. [provider]  Active Child  CRANBERRY PO [id number] Yes Take 1 capsule by mouth 2 (two) times daily. [provider]  Active Child  feeding supplement (ENSURE PLUS HIGH PROTEIN) LIQD [id number] Yes Take 237 mLs by mouth 2 (two) times daily between meals. Josette Ade, MD  Active Child           Med Note (CRUTHIS, SHEFFIELD BROCKS   Wed Sep 20, 2024  1:22 PM) Medication started during  admission on 09/14/24-09/20/24  folic acid  (FOLVITE ) 1 MG tablet [id number] Yes Take 3 mg by mouth daily. [provider]  Active Child           Med Note (CRUTHIS, SHEFFIELD BROCKS   Wed Sep 20, 2024  1:11 PM)    levothyroxine  (SYNTHROID ) 75 MCG tablet [id number] Yes TAKE 1 TABLET BY MOUTH ( TOTAL) DAILY Tullo, Teresa L, MD  Active   meclizine  (ANTIVERT ) 12.5 MG tablet [id number] Yes Take 1 tablet (12.5 mg total) by mouth 3 (three) times daily as needed for dizziness or nausea. Love, Pamela S, PA-C  Active   methotrexate  (RHEUMATREX) 2.5 MG  tablet [id number] Yes Take 20 mg by mouth once a week. Caution:Chemotherapy. Protect from light. [provider]  Active Child  nitroGLYCERIN  (NITROSTAT ) 0.4 MG SL tablet [id number] Yes Place 1 tablet (0.4 mg total) under the tongue every 5 (five) minutes as needed for chest pain. Lue Elsie BROCKS, MD  Active   omeprazole  (PRILOSEC) 20 MG capsule [id number] Yes TAKE 1 CAPSULE (20 MG TOTAL) BY MOUTH 2 (TWO) TIMES DAILY BEFORE A MEAL. Marylynn Verneita CROME, MD   Active   ondansetron  (ZOFRAN ) 4 MG tablet [id number] Yes Take 4 mg by mouth every 6 (six) hours as needed for nausea or vomiting. [provider]  Active Child  polyethylene glycol (MIRALAX  / GLYCOLAX ) packet [id number] Yes Take 17 g by mouth daily. [provider]  Active Child  predniSONE  (DELTASONE ) 2.5 MG tablet [id number] Yes Take 2.5 mg by mouth daily. [provider]  Active Child  Probiotic Product (PROBIOTIC PO) [id number] Yes Take 1 tablet by mouth daily. [provider]  Active Child  sertraline  (ZOLOFT ) 25 MG tablet [id number] Yes TAKE 1 TABLET (25 MG TOTAL) BY MOUTH DAILY. Marylynn Verneita CROME, MD  Active   witch hazel-glycerin  (TUCKS) pad [id number] Yes Apply topically as needed for itching. Maurice Sharlet RAMAN, PA-C  Active             Home Care and Equipment/Supplies: Were Home Health Services Ordered?: Yes Name of Home Health Agency:: [facility] Has Agency set up a time to come to your home?: Yes First Home Health Visit Date: 10/13/24 (RN scheduled, pending PT/OT/aide) Any new equipment or medical supplies ordered?: Yes Name of Medical supply agency?: Hospital bed/BSC and wheelchair Were you able to get the equipment/medical supplies?: No (Received bed, BSC and pending delivery of the wheelchair today. Daughter reports she is using one from a fried until delivery) Do you have any questions related to the use of the equipment/supplies?: No  Functional Questionnaire: Do you need assistance with bathing/showering or dressing?: Yes (Family assist with this task) Do you need assistance with meal preparation?: Yes (daughter assist with this task) Do you need assistance with eating?: No Do you have difficulty maintaining continence: No Do you need assistance with getting out of bed/getting out of a chair/moving?: Yes (daughter assist with this task) Do you have difficulty managing or taking your medications?: Yes (daughter assist with this  task)  Follow up appointments reviewed: PCP Follow-up appointment confirmed?: Yes Date of PCP follow-up appointment?: 10/17/24 Follow-up Provider: Dr. Marylynn Specialist [facility] Follow-up appointment confirmed?: Yes Date of Specialist follow-up appointment?: 10/20/24 Follow-Up Specialty Provider:: [place], FNP (Cardiology) Do you need transportation to your follow-up appointment?: No Do you understand care options if your condition(s) worsen?: Yes-patient verbalized understanding  SDOH Interventions Today    Flowsheet Row Most Recent Value  SDOH Interventions   Food Insecurity Interventions Intervention Not Indicated  Housing Interventions Intervention Not Indicated  Transportation Interventions Other (Comment), AMB Referral  [Dasughter/dpr Kim seeking future transportation resources. [email or web address] provided and offered child psychotherapist consult for additional resources.]  Utilities Interventions Intervention Not Indicated  Depression Interventions/Treatment  Medication  [Daughter reports pt has counseling with her pastor and continue pt continue to take her Zoloft  as prescribed. RNCM offered BSW for counseling-daughrter opt to decline at this time.depression/anxiety screening results sent to provider]   Discussed and offered 30 day TOC program.  Patient-Daughter/DPR Manley Santiago has agreed to the 30 day TOC program .  The patient has been provided with contact information for the  care management team and has been advised to call with any health -related questions or concerns.  The patient verbalized understanding with current plan of care.  The patient is directed to their insurance card regarding availability of benefits coverage.    Olam Ku, RN, BSN, MSN [facility], [facility] RN Care Manager Direct Dial: [phone]  Fax: [phone]   "

## 2024-10-12 NOTE — Progress Notes (Signed)
 Complex Care Management Note Care Guide Note  10/12/2024 Name: Kim Santiago MRN: [id number] DOB: 1934/09/26   Complex Care Management Outreach Attempts: An unsuccessful telephone outreach was attempted today to offer the patient information about available complex care management services.  Follow Up Plan:  Additional outreach attempts will be made to offer the patient complex care management information and services.   Encounter Outcome:  No Answer  Timea Breed [facility]  [facility] Guide Direct Dial: [phone]  Fax: [phone] Website: [email or web address]

## 2024-10-12 NOTE — Patient Instructions (Addendum)
 Visit Information  Thank you for taking time to visit with me today. Please don't hesitate to contact me if I can be of assistance to you before our next scheduled telephone appointment.  Our next appointment is by telephone on 10/19/2024 at 10:00 am  Following is a copy of your care plan:   Goals Addressed             This Visit's Progress    VBCI Transitions of Care (TOC) Care Plan       Problems:  Recent Hospitalization for treatment of NSTEMI (NON-ST elevated MI) Knowledge Deficit Related to NSTEMI  and SDOH barrier: Future transportation needed for medical appointments  Goal:  Over the next 30 days, the patient will not experience hospital readmission Caregiver will have a better understanding on how to monitoring post hospital NSTEMI signs   Interventions:  Transitions of Care: Durable Medical Equipment (DME) reviewed with patient/caregiver, orders reviewed, delivery confirmed, and education provided Doctor Visits  - discussed the importance of doctor visits Will arranged PCP follow-up within 7 days  Will make a referral for social work for transportation resources. Advise daughter to utilize [email or web address].[email or web address] resources for future needs Advise daughter to monitor for chest pain, SOB, palpitations, dizziness/syncope and when to call her providers  and educated on all medications Advise daughter on warning signs related to worsening symptonms and what to do if acute   Patient Self Care Activities:  Attend all scheduled provider appointments Call pharmacy for medication refills 3-7 days in advance of running out of medications Call provider office for new concerns or questions  Notify RN Care Manager of TOC call rescheduling needs Participate in Transition of Care Program/Attend TOC scheduled calls Perform all self care activities independently  Take medications as prescribed   Work with the social worker to address care coordination needs and will continue to work with the  clinical team to address health care and disease management related needs  Plan:  Telephone follow up appointment with care management team member scheduled for:  10/19/24 @ 0930 AM The patient has been provided with contact information for the care management team and has been advised to call with any health related questions or concerns.         Care plan and visit instructions communicated with the patient verbally today. Patient agrees to receive a copy in MyChart. Active MyChart status and patient understanding of how to access instructions and care plan via MyChart confirmed with patient.     Telephone follow up appointment with care management team member scheduled for: The patient has been provided with contact information for the care management team and has been advised to call with any health related questions or concerns.   Please call the care guide team at [phone] if you need to cancel or reschedule your appointment.   Please call the Suicide and Crisis Lifeline: 988 call the [place]  National Suicide Prevention Lifeline: 612-742-9336 or TTY: (681)427-4912 TTY 939-866-0588) to talk to a trained counselor call 1-800-273-TALK (toll free, 24 hour hotline) if you are experiencing a Mental Health or Behavioral Health Crisis or need someone to talk to.   Olam Ku, RN, BSN, MSN [facility], [facility] RN Care Manager Direct Dial: [phone]  Fax: [phone]

## 2024-10-13 ENCOUNTER — Telehealth: Payer: Self-pay

## 2024-10-13 NOTE — Progress Notes (Signed)
 Complex Care Management Note Care Guide Note  10/13/2024 Name: Kim Santiago MRN: [id number] DOB: 11-Sep-1934   Complex Care Management Outreach Attempts: A second unsuccessful outreach was attempted today to offer the patient with information about available complex care management services.  Follow Up Plan:  Additional outreach attempts will be made to offer the patient complex care management information and services.   Encounter Outcome:  No Answer  Mykel Mohl [facility]  [facility] Guide Direct Dial: [phone]  Fax: [phone] Website: [email or web address]

## 2024-10-17 ENCOUNTER — Inpatient Hospital Stay: Admitting: Internal Medicine

## 2024-10-20 ENCOUNTER — Ambulatory Visit: Admitting: Student

## 2024-12-04 ENCOUNTER — Ambulatory Visit: Admitting: Internal Medicine

## 2025-04-23 ENCOUNTER — Ambulatory Visit
